# Patient Record
Sex: Male | Born: 1945 | Race: White | Hispanic: No | Marital: Married | Smoking: Never smoker
Health system: Southern US, Community
[De-identification: ages and names within clinical notes are randomized; demographics above are authoritative.]

## PROBLEM LIST (undated history)

## (undated) DIAGNOSIS — R42 Dizziness and giddiness: Secondary | ICD-10-CM

## (undated) DIAGNOSIS — E781 Pure hyperglyceridemia: Secondary | ICD-10-CM

## (undated) DIAGNOSIS — Z951 Presence of aortocoronary bypass graft: Secondary | ICD-10-CM

## (undated) DIAGNOSIS — G20 Parkinson's disease: Secondary | ICD-10-CM

## (undated) DIAGNOSIS — Z9889 Other specified postprocedural states: Secondary | ICD-10-CM

## (undated) DIAGNOSIS — I2510 Atherosclerotic heart disease of native coronary artery without angina pectoris: Secondary | ICD-10-CM

## (undated) DIAGNOSIS — G709 Myoneural disorder, unspecified: Secondary | ICD-10-CM

## (undated) DIAGNOSIS — G20A1 Parkinson's disease without dyskinesia, without mention of fluctuations: Secondary | ICD-10-CM

## (undated) DIAGNOSIS — I10 Essential (primary) hypertension: Secondary | ICD-10-CM

## (undated) DIAGNOSIS — E785 Hyperlipidemia, unspecified: Secondary | ICD-10-CM

## (undated) DIAGNOSIS — E119 Type 2 diabetes mellitus without complications: Secondary | ICD-10-CM

## (undated) DIAGNOSIS — T7840XA Allergy, unspecified, initial encounter: Secondary | ICD-10-CM

## (undated) HISTORY — DX: Other specified postprocedural states: Z98.890

## (undated) HISTORY — DX: Type 2 diabetes mellitus without complications: E11.9

## (undated) HISTORY — DX: Dizziness and giddiness: R42

## (undated) HISTORY — DX: Parkinson's disease without dyskinesia, without mention of fluctuations: G20.A1

## (undated) HISTORY — DX: Myoneural disorder, unspecified: G70.9

## (undated) HISTORY — DX: Pure hyperglyceridemia: E78.1

## (undated) HISTORY — DX: Essential (primary) hypertension: I10

## (undated) HISTORY — DX: Hyperlipidemia, unspecified: E78.5

## (undated) HISTORY — DX: Presence of aortocoronary bypass graft: Z95.1

## (undated) HISTORY — DX: Parkinson's disease: G20

## (undated) HISTORY — DX: Atherosclerotic heart disease of native coronary artery without angina pectoris: I25.10

## (undated) HISTORY — DX: Allergy, unspecified, initial encounter: T78.40XA

---

## 1998-03-09 HISTORY — PX: CORONARY ARTERY BYPASS GRAFT: SHX141

## 1998-04-09 DIAGNOSIS — Z951 Presence of aortocoronary bypass graft: Secondary | ICD-10-CM

## 1998-04-09 HISTORY — DX: Presence of aortocoronary bypass graft: Z95.1

## 1998-04-10 ENCOUNTER — Encounter: Payer: Self-pay | Admitting: Thoracic Surgery (Cardiothoracic Vascular Surgery)

## 1998-04-11 ENCOUNTER — Inpatient Hospital Stay (HOSPITAL_COMMUNITY)
Admission: RE | Admit: 1998-04-11 | Discharge: 1998-04-15 | Payer: Self-pay | Admitting: Thoracic Surgery (Cardiothoracic Vascular Surgery)

## 1998-04-11 ENCOUNTER — Encounter: Payer: Self-pay | Admitting: Thoracic Surgery (Cardiothoracic Vascular Surgery)

## 1998-04-12 ENCOUNTER — Encounter: Payer: Self-pay | Admitting: Thoracic Surgery (Cardiothoracic Vascular Surgery)

## 1998-04-13 ENCOUNTER — Encounter: Payer: Self-pay | Admitting: Thoracic Surgery (Cardiothoracic Vascular Surgery)

## 2004-03-09 ENCOUNTER — Encounter: Payer: Self-pay | Admitting: Cardiology

## 2004-04-09 ENCOUNTER — Encounter: Payer: Self-pay | Admitting: Cardiology

## 2004-05-09 ENCOUNTER — Encounter: Payer: Self-pay | Admitting: Cardiology

## 2004-06-09 ENCOUNTER — Encounter: Payer: Self-pay | Admitting: Cardiology

## 2004-07-10 ENCOUNTER — Encounter: Payer: Self-pay | Admitting: Cardiology

## 2004-08-07 ENCOUNTER — Encounter: Payer: Self-pay | Admitting: Cardiology

## 2004-09-07 ENCOUNTER — Encounter: Payer: Self-pay | Admitting: Cardiology

## 2004-10-07 ENCOUNTER — Encounter: Payer: Self-pay | Admitting: Cardiology

## 2004-11-07 ENCOUNTER — Encounter: Payer: Self-pay | Admitting: Cardiology

## 2004-12-07 ENCOUNTER — Encounter: Payer: Self-pay | Admitting: Cardiology

## 2005-01-07 ENCOUNTER — Encounter: Payer: Self-pay | Admitting: Cardiology

## 2005-02-07 ENCOUNTER — Encounter: Payer: Self-pay | Admitting: Cardiology

## 2005-03-09 ENCOUNTER — Encounter: Payer: Self-pay | Admitting: Cardiology

## 2005-04-09 ENCOUNTER — Encounter: Payer: Self-pay | Admitting: Cardiology

## 2005-05-09 ENCOUNTER — Encounter: Payer: Self-pay | Admitting: Cardiology

## 2005-06-09 ENCOUNTER — Encounter: Payer: Self-pay | Admitting: Cardiology

## 2005-07-10 ENCOUNTER — Encounter: Payer: Self-pay | Admitting: Cardiology

## 2005-08-07 ENCOUNTER — Encounter: Payer: Self-pay | Admitting: Cardiology

## 2005-09-07 ENCOUNTER — Encounter: Payer: Self-pay | Admitting: Cardiology

## 2005-10-07 ENCOUNTER — Encounter: Payer: Self-pay | Admitting: Cardiology

## 2005-11-07 ENCOUNTER — Encounter: Payer: Self-pay | Admitting: Cardiology

## 2005-12-07 ENCOUNTER — Encounter: Payer: Self-pay | Admitting: Cardiology

## 2006-01-07 ENCOUNTER — Encounter: Payer: Self-pay | Admitting: Cardiology

## 2006-02-07 ENCOUNTER — Encounter: Payer: Self-pay | Admitting: Cardiology

## 2006-03-09 ENCOUNTER — Encounter: Payer: Self-pay | Admitting: Cardiology

## 2006-04-09 ENCOUNTER — Encounter: Payer: Self-pay | Admitting: Cardiology

## 2006-05-09 ENCOUNTER — Encounter: Payer: Self-pay | Admitting: Cardiology

## 2006-06-09 ENCOUNTER — Encounter: Payer: Self-pay | Admitting: Cardiology

## 2006-07-10 ENCOUNTER — Encounter: Payer: Self-pay | Admitting: Cardiology

## 2006-08-08 ENCOUNTER — Encounter: Payer: Self-pay | Admitting: Cardiology

## 2006-09-08 ENCOUNTER — Encounter: Payer: Self-pay | Admitting: Cardiology

## 2006-10-08 ENCOUNTER — Encounter: Payer: Self-pay | Admitting: Cardiology

## 2006-11-08 ENCOUNTER — Encounter: Payer: Self-pay | Admitting: Cardiology

## 2006-12-08 ENCOUNTER — Encounter: Payer: Self-pay | Admitting: Cardiology

## 2009-03-21 ENCOUNTER — Ambulatory Visit: Payer: Self-pay | Admitting: Gastroenterology

## 2009-06-09 DIAGNOSIS — Z9889 Other specified postprocedural states: Secondary | ICD-10-CM

## 2009-06-09 HISTORY — DX: Other specified postprocedural states: Z98.890

## 2009-06-23 LAB — HM COLONOSCOPY: HM Colonoscopy: NORMAL

## 2010-05-09 ENCOUNTER — Ambulatory Visit: Payer: Self-pay | Admitting: Cardiology

## 2010-05-19 HISTORY — PX: CARDIAC CATHETERIZATION: SHX172

## 2010-11-19 ENCOUNTER — Ambulatory Visit: Payer: BC Managed Care – PPO | Admitting: Internal Medicine

## 2011-04-29 ENCOUNTER — Other Ambulatory Visit: Payer: Self-pay | Admitting: Internal Medicine

## 2011-04-29 MED ORDER — METFORMIN HCL 500 MG PO TABS: 500.0000 mg | ORAL_TABLET | Freq: Two times a day (BID) | ORAL | Status: DC

## 2011-05-21 ENCOUNTER — Ambulatory Visit (INDEPENDENT_AMBULATORY_CARE_PROVIDER_SITE_OTHER): Payer: Medicare Other | Admitting: Internal Medicine

## 2011-05-21 ENCOUNTER — Encounter: Payer: Self-pay | Admitting: Internal Medicine

## 2011-05-21 DIAGNOSIS — Z23 Encounter for immunization: Secondary | ICD-10-CM

## 2011-05-21 DIAGNOSIS — E119 Type 2 diabetes mellitus without complications: Secondary | ICD-10-CM

## 2011-05-21 DIAGNOSIS — E785 Hyperlipidemia, unspecified: Secondary | ICD-10-CM

## 2011-05-21 DIAGNOSIS — K2970 Gastritis, unspecified, without bleeding: Secondary | ICD-10-CM

## 2011-05-21 DIAGNOSIS — I209 Angina pectoris, unspecified: Secondary | ICD-10-CM

## 2011-05-21 DIAGNOSIS — Z0000 Encounter for general adult medical examination without abnormal findings: Secondary | ICD-10-CM

## 2011-05-21 DIAGNOSIS — E1159 Type 2 diabetes mellitus with other circulatory complications: Secondary | ICD-10-CM

## 2011-05-21 DIAGNOSIS — E1121 Type 2 diabetes mellitus with diabetic nephropathy: Secondary | ICD-10-CM | POA: Insufficient documentation

## 2011-05-21 DIAGNOSIS — R7989 Other specified abnormal findings of blood chemistry: Secondary | ICD-10-CM

## 2011-05-21 DIAGNOSIS — Z125 Encounter for screening for malignant neoplasm of prostate: Secondary | ICD-10-CM

## 2011-05-21 DIAGNOSIS — Z79899 Other long term (current) drug therapy: Secondary | ICD-10-CM

## 2011-05-21 DIAGNOSIS — K2990 Gastroduodenitis, unspecified, without bleeding: Secondary | ICD-10-CM

## 2011-05-21 DIAGNOSIS — E790 Hyperuricemia without signs of inflammatory arthritis and tophaceous disease: Secondary | ICD-10-CM

## 2011-05-21 LAB — LIPID PANEL
Cholesterol: 130 mg/dL (ref 0–200)
HDL: 61.6 mg/dL (ref >39.00)
Total CHOL/HDL Ratio: 2
Triglycerides: 155 mg/dL — ABNORMAL HIGH (ref 0.0–149.0)

## 2011-05-21 LAB — COMPREHENSIVE METABOLIC PANEL
AST: 19 U/L (ref 0–37)
Albumin: 4.7 g/dL (ref 3.5–5.2)
BUN: 17 mg/dL (ref 6–23)
Calcium: 9.3 mg/dL (ref 8.4–10.5)
Chloride: 104 mEq/L (ref 96–112)
Creatinine, Ser: 1.4 mg/dL (ref 0.4–1.5)
GFR: 53.83 mL/min — ABNORMAL LOW (ref >60.00)
Glucose, Bld: 92 mg/dL (ref 70–99)

## 2011-05-21 LAB — HEMOGLOBIN A1C: Hgb A1c MFr Bld: 6.5 % (ref 4.6–6.5)

## 2011-05-21 MED ORDER — SIMVASTATIN 20 MG PO TABS: 20.0000 mg | ORAL_TABLET | Freq: Every day | ORAL | Status: DC

## 2011-05-21 MED ORDER — ISOSORBIDE MONONITRATE ER 30 MG PO TB24: 30.0000 mg | ORAL_TABLET | Freq: Every day | ORAL | Status: DC

## 2011-05-21 MED ORDER — ZOSTER VACCINE LIVE 19400 UNT/0.65ML ~~LOC~~ SOLR: 0.6500 mL | Freq: Once | SUBCUTANEOUS | Status: AC

## 2011-05-21 NOTE — Progress Notes (Signed)
Subjective:    Patient ID: Johnathan Arnold, male    DOB: Sep 01, 1945, 65 y.o.   MRN: [id number]  HPI  Cc indigestion 2-3 hrs post prandial,  burnign in stomach. No esophogitis sympotms.  Takes tums for relief . Has anginal pain daily with exertion, but tapers off with continued use.  Had a clean cardiac cath one year ago and wa s put on Imdur 30 mg daily .   Review of Systems     Objective:   Physical Exam        Assessment & Plan:   Subjective:    Johnathan Arnold is a 65 y.o. male who presents for Medicare Annual/Subsequent preventive examination.   Preventive Screening-Counseling & Management  Tobacco History  Smoking status  . Never Smoker   Smokeless tobacco  . Never Used    Problems Prior to Visit 1.   Current Problems (verified) There is no problem list on file for this patient.   Medications Prior to Visit Current Outpatient Prescriptions on File Prior to Visit  Medication Sig Dispense Refill  . metFORMIN (GLUCOPHAGE) 500 MG tablet Take 1 tablet (500 mg total) by mouth 2 (two) times daily with a meal.  180 tablet  3    Current Medications (verified) Current Outpatient Prescriptions  Medication Sig Dispense Refill  . aspirin 81 MG tablet Take 81 mg by mouth daily.        Marland Kitchen atenolol (TENORMIN) 50 MG tablet Take 50 mg by mouth daily.        . fish oil-omega-3 fatty acids 1000 MG capsule Take 2 g by mouth daily.        . isosorbide mononitrate (IMDUR) 30 MG 24 hr tablet Take 1 tablet (30 mg total) by mouth daily.  90 tablet  3  . lisinopril (PRINIVIL,ZESTRIL) 20 MG tablet Take 20 mg by mouth daily.        . metFORMIN (GLUCOPHAGE) 500 MG tablet Take 1 tablet (500 mg total) by mouth 2 (two) times daily with a meal.  180 tablet  3  . Multiple Vitamin (MULTIVITAMIN) tablet Take 1 tablet by mouth daily.        . niacin (NIASPAN) 1000 MG CR tablet Take 1,000 mg by mouth at bedtime.        . simvastatin (ZOCOR) 20 MG tablet Take 1 tablet (20 mg total) by mouth at  bedtime.  90 tablet  3  . zoster vaccine live, PF, (ZOSTAVAX) 40981 UNT/0.65ML injection Inject 19,400 Units into the skin once.  1 vial  0     Allergies (verified) Review of patient's allergies indicates no known allergies.   PAST HISTORY  Family History No family history on file.  Social History History  Substance Use Topics  . Smoking status: Never Smoker   . Smokeless tobacco: Never Used  . Alcohol Use: No    Are there smokers in your home (other than you)?  No  Risk Factors Current exercise habits: Home exercise routine includes walking 1 hrs per day.  Dietary issues discussed: none   Cardiac risk factors: advanced age (older than 21 for men, 55 for women), diabetes mellitus, dyslipidemia and known CAD.  Depression Screen (Note: if answer to either of the following is "Yes", a more complete depression screening is indicated)   Q1: Over the past two weeks, have you felt down, depressed or hopeless? No  Q2: Over the past two weeks, have you felt little interest or pleasure in doing things? No  Have you lost interest or pleasure in daily life? No  Do you often feel hopeless? No  Do you cry easily over simple problems? No  Activities of Daily Living In your present state of health, do you have any difficulty performing the following activities?:  Driving? no Managing money?  No Feeding yourself? No Getting from bed to chair? No Climbing a flight of stairs? No Preparing food and eating?: No Bathing or showering? No Getting dressed: No Getting to the toilet? No Using the toilet:No Moving around from place to place: No In the past year have you fallen or had a near fall?:No   Are you sexually active?  Yes  Do you have more than one partner?  No  Hearing Difficulties: No Do you often ask people to speak up or repeat themselves? No Do you experience ringing or noises in your ears? No Do you have difficulty understanding soft or whispered voices? No   Do you feel  that you have a problem with memory? No  Do you often misplace items? No  Do you feel safe at home?  Yes  Cognitive Testing  Alert? yes  Normal Appearance?Yes  Oriented to person? Yes  Place? Yes   Time? Yes  Recall of three objects?  Yes  Can perform simple calculations? Yes  Displays appropriate judgment?Yes  Can read the correct time from a watch face?Yes   Advanced Directives have been discussed with the patient? No   List the Names of Other Physician/Practitioners you currently use: 1.    Indicate any recent Medical Services you may have received from other than [facility] providers in the past year (date may be approximate).  Immunization History  Administered Date(s) Administered  . Influenza Split 02/19/2011  . Pneumococcal Polysaccharide 05/21/2011  . Tdap 12/19/2010    Screening Tests Health Maintenance  Topic Date Due  . Tetanus/tdap  01/12/1964  . Colonoscopy  01/12/1995  . Zostavax  01/11/2005  . Pneumococcal Polysaccharide Vaccine Age 76 And Over  01/11/2010  . Influenza Vaccine  03/10/2011    All answers were reviewed with the patient and necessary referrals were made:  Duncan Dull, MD   05/21/2011   History reviewed: allergies, current medications, past family history, past medical history and past surgical history  Review of Systems A comprehensive review of systems was negative.    Objective:     Vision by Snellen chart: right eye:20/20, left eye:20/20 Blood pressure 132/60, pulse 54, temperature 98.2 F (36.8 C), temperature source Oral, resp. rate 16, height 5\' 7"  (1.702 m), weight 195 lb 4 oz (88.565 kg), SpO2 98.00%. Body mass index is 30.58 kg/(m^2).  BP 132/60  Pulse 54  Temp(Src) 98.2 F (36.8 C) (Oral)  Resp 16  Ht 5\' 7"  (1.702 m)  Wt 195 lb 4 oz (88.565 kg)  BMI 30.58 kg/m2  SpO2 98%  General Appearance:    Alert, cooperative, no distress, appears stated age  Head:    Normocephalic, without obvious abnormality, atraumatic  Eyes:     PERRL, conjunctiva/corneas clear, EOM's intact, fundi    benign, both eyes       Ears:    Normal TM's and external ear canals, both ears  Nose:   Nares normal, septum midline, mucosa normal, no drainage    or sinus tenderness  Throat:   Lips, mucosa, and tongue normal; teeth and gums normal  Neck:   Supple, symmetrical, trachea midline, no adenopathy;       thyroid:  No enlargement/tenderness/nodules; no  carotid   bruit or JVD  Back:     Symmetric, no curvature, ROM normal, no CVA tenderness  Lungs:     Clear to auscultation bilaterally, respirations unlabored  Chest wall:    No tenderness or deformity  Heart:    Regular rate and rhythm, S1 and S2 normal, no murmur, rub   or gallop  Abdomen:     Soft, non-tender, bowel sounds active all four quadrants,    no masses, no organomegaly  Genitalia:    Normal male without lesion, discharge or tenderness  Rectal:    Normal tone, normal prostate, no masses or tenderness;   guaiac negative stool  Extremities:   Extremities normal, atraumatic, no cyanosis or edema  Pulses:   2+ and symmetric all extremities  Skin:   Skin color, texture, turgor normal, no rashes or lesions  Lymph nodes:   Cervical, supraclavicular, and axillary nodes normal  Neurologic:   CNII-XII intact. Normal strength, sensation and reflexes      throughout       Assessment:  Healthy male annual exam Diabetes Mellitus,  Coronary Artery Disease     Plan:     During the course of the visit the patient was educated and counseled about appropriate screening and preventive services including:    Pneumococcal vaccine   Influenza vaccine  Td vaccine  Diet review for nutrition referral? Yes ____  Not Indicated __x__   Patient Instructions (the written plan) was given to the patient.  Medicare Attestation I have personally reviewed: The patient's medical and social history Their use of alcohol, tobacco or illicit drugs Their current medications and  supplements The patient's functional ability including ADLs,fall risks, home safety risks, cognitive, and hearing and visual impairment Diet and physical activities Evidence for depression or mood disorders  The patient's weight, height, BMI, and visual acuity have been recorded in the chart.  I have made referrals, counseling, and provided education to the patient based on review of the above and I have provided the patient with a written personalized care plan for preventive services.     Duncan Dull, MD   05/21/2011

## 2011-05-21 NOTE — Patient Instructions (Addendum)
There are 3 PPIs OTC:  Prilosec, Prevacid,  And Zegerid. Try one of those daily for 2 weeks,  If no improvement, we will set up GI referrral to Dr. Alexander Bergeron.   I need your PSA from last year and Dr. Jasmine December records.    I will do your prostate exam at your next 3 month heckup

## 2011-05-21 NOTE — Assessment & Plan Note (Signed)
Trial of daily PPI recommended .  If no improvement  In 2 weeks,  Will refer for EGD

## 2011-05-21 NOTE — Assessment & Plan Note (Signed)
recurrent despite normal cardiac catherization one year ago and use of Imdur.  Suggested increasing dose to 60 mg but he refers to let Dr. Darrold Junker managed

## 2011-05-21 NOTE — Assessment & Plan Note (Signed)
hgba1c has been historically well controlled

## 2011-05-22 LAB — MICROALBUMIN / CREATININE URINE RATIO
Creatinine,U: 49.6 mg/dL
Microalb, Ur: 0.8 mg/dL (ref 0.0–1.9)

## 2011-06-13 DIAGNOSIS — M999 Biomechanical lesion, unspecified: Secondary | ICD-10-CM | POA: Diagnosis not present

## 2011-06-13 DIAGNOSIS — M5380 Other specified dorsopathies, site unspecified: Secondary | ICD-10-CM | POA: Diagnosis not present

## 2011-06-13 DIAGNOSIS — IMO0002 Reserved for concepts with insufficient information to code with codable children: Secondary | ICD-10-CM | POA: Diagnosis not present

## 2011-06-27 DIAGNOSIS — M999 Biomechanical lesion, unspecified: Secondary | ICD-10-CM | POA: Diagnosis not present

## 2011-06-27 DIAGNOSIS — IMO0002 Reserved for concepts with insufficient information to code with codable children: Secondary | ICD-10-CM | POA: Diagnosis not present

## 2011-06-27 DIAGNOSIS — M5380 Other specified dorsopathies, site unspecified: Secondary | ICD-10-CM | POA: Diagnosis not present

## 2011-07-17 DIAGNOSIS — IMO0002 Reserved for concepts with insufficient information to code with codable children: Secondary | ICD-10-CM | POA: Diagnosis not present

## 2011-07-17 DIAGNOSIS — M5380 Other specified dorsopathies, site unspecified: Secondary | ICD-10-CM | POA: Diagnosis not present

## 2011-07-17 DIAGNOSIS — M999 Biomechanical lesion, unspecified: Secondary | ICD-10-CM | POA: Diagnosis not present

## 2011-07-31 DIAGNOSIS — E782 Mixed hyperlipidemia: Secondary | ICD-10-CM | POA: Diagnosis not present

## 2011-07-31 DIAGNOSIS — R0789 Other chest pain: Secondary | ICD-10-CM | POA: Diagnosis not present

## 2011-07-31 DIAGNOSIS — I2510 Atherosclerotic heart disease of native coronary artery without angina pectoris: Secondary | ICD-10-CM | POA: Diagnosis not present

## 2011-08-13 ENCOUNTER — Encounter: Payer: Self-pay | Admitting: Internal Medicine

## 2011-08-13 DIAGNOSIS — M999 Biomechanical lesion, unspecified: Secondary | ICD-10-CM | POA: Diagnosis not present

## 2011-08-13 DIAGNOSIS — M5380 Other specified dorsopathies, site unspecified: Secondary | ICD-10-CM | POA: Diagnosis not present

## 2011-08-13 DIAGNOSIS — IMO0002 Reserved for concepts with insufficient information to code with codable children: Secondary | ICD-10-CM | POA: Diagnosis not present

## 2011-08-21 ENCOUNTER — Encounter: Payer: Self-pay | Admitting: Internal Medicine

## 2011-08-21 ENCOUNTER — Ambulatory Visit (INDEPENDENT_AMBULATORY_CARE_PROVIDER_SITE_OTHER): Payer: Medicare Other | Admitting: Internal Medicine

## 2011-08-21 VITALS — BP 136/60 | HR 71 | Temp 97.8°F | Resp 14 | Ht 67.0 in | Wt 194.5 lb

## 2011-08-21 DIAGNOSIS — E781 Pure hyperglyceridemia: Secondary | ICD-10-CM

## 2011-08-21 DIAGNOSIS — I209 Angina pectoris, unspecified: Secondary | ICD-10-CM

## 2011-08-21 DIAGNOSIS — E1169 Type 2 diabetes mellitus with other specified complication: Secondary | ICD-10-CM

## 2011-08-21 DIAGNOSIS — E119 Type 2 diabetes mellitus without complications: Secondary | ICD-10-CM | POA: Diagnosis not present

## 2011-08-21 DIAGNOSIS — Z951 Presence of aortocoronary bypass graft: Secondary | ICD-10-CM | POA: Insufficient documentation

## 2011-08-21 DIAGNOSIS — E1159 Type 2 diabetes mellitus with other circulatory complications: Secondary | ICD-10-CM

## 2011-08-21 MED ORDER — HYDROCORTISONE ACETATE 25 MG RE SUPP: 25.0000 mg | Freq: Two times a day (BID) | RECTAL | Status: AC

## 2011-08-21 NOTE — Patient Instructions (Signed)
I am sending you to [facility] for ankle brachial indices to evaluate the circulation in your feet

## 2011-08-21 NOTE — Assessment & Plan Note (Addendum)
With recent evaluation by Dr. Laurena Bering who has increased imdur to 60 mg daily  and added prilosec for presumed reflux. These changes have not altered his pain.  His last cardiac cath was clear in dec 2011, which was his first repeat Since his bypass in 2000.  we discussed the diagnosis of Prinzmetal's angina or vasospasm as a possible source for his pain he is following up with Dr. Gwen Pounds this month.

## 2011-08-22 LAB — COMPLETE METABOLIC PANEL WITH GFR
ALT: 20 U/L (ref 0–53)
Albumin: 4.7 g/dL (ref 3.5–5.2)
CO2: 24 mEq/L (ref 19–32)
Calcium: 9.6 mg/dL (ref 8.4–10.5)
Chloride: 104 mEq/L (ref 96–112)
GFR, Est African American: 66 mL/min
GFR, Est Non African American: 57 mL/min — ABNORMAL LOW
Glucose, Bld: 185 mg/dL — ABNORMAL HIGH (ref 70–99)
Potassium: 4.6 mEq/L (ref 3.5–5.3)
Sodium: 140 mEq/L (ref 135–145)
Total Bilirubin: 0.4 mg/dL (ref 0.3–1.2)
Total Protein: 6.2 g/dL (ref 6.0–8.3)

## 2011-08-23 ENCOUNTER — Encounter: Payer: Self-pay | Admitting: Internal Medicine

## 2011-08-23 DIAGNOSIS — I2510 Atherosclerotic heart disease of native coronary artery without angina pectoris: Secondary | ICD-10-CM | POA: Insufficient documentation

## 2011-08-23 NOTE — Progress Notes (Signed)
Subjective:    Patient ID: Johnathan Arnold, male    DOB: 02-12-1946, 66 y.o.   MRN: [id number]  HPI Mr. Mcconaha is a 66 year old white male with a history of diabetes mellitus type 2 well controlled hyperlipidemia well controlled and coronary artery disease status post non-5 vessel CABG several years ago who presents for three-month followup on diabetes. During the interim he has been seen by his cardiologist Dr. Gwen Pounds for recurrent angina which occurs with exercise. He has been placed on Imdur with no significant change. Apparently he has had a noninvasive stress test which was nonrevealing. He is following up with Dr. Gwen Pounds within the next 30 days. Regarding his diabetes and hyperlipidemia he has been following a low carbohydrate diet taking his medications correctly. And has no side effects from his medications. He is due for his repeat hemoglobin A1c today. He denies any numbness or tingling in his feet and has had no hypoglycemic events. He is up-to-date on his annual diabetic eye exams  Past Medical History  Diagnosis Date  . 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 Hypertriglyceridemia   . 3-vessel coronary artery disease     s/p  5 vessel CABG   Past Surgical History  Procedure Date  . Coronary artery bypass graft     5 vessel, [facility]   Review of Systems  Constitutional: Negative for fever, chills, diaphoresis, activity change, appetite change, fatigue and unexpected weight change.  HENT: Negative for hearing loss, ear pain, nosebleeds, congestion, sore throat, facial swelling, rhinorrhea, sneezing, drooling, mouth sores, trouble swallowing, neck pain, neck stiffness, dental problem, voice change, postnasal drip, sinus pressure, tinnitus and ear discharge.   Eyes: Negative for photophobia, pain, discharge, redness, itching and visual disturbance.  Respiratory: Negative for apnea, cough, choking, chest tightness, shortness of breath, wheezing and stridor.   Cardiovascular: Positive for chest pain.  Negative for palpitations and leg swelling.  Gastrointestinal: Negative for nausea, vomiting, abdominal pain, diarrhea, constipation, blood in stool, abdominal distention, anal bleeding and rectal pain.  Genitourinary: Negative for dysuria, urgency, frequency, hematuria, flank pain, decreased urine volume, scrotal swelling, difficulty urinating and testicular pain.  Musculoskeletal: Negative for myalgias, back pain, joint swelling, arthralgias and gait problem.  Skin: Negative for color change, rash and wound.  Neurological: Negative for dizziness, tremors, seizures, syncope, speech difficulty, weakness, light-headedness, numbness and headaches.  Psychiatric/Behavioral: Negative for suicidal ideas, hallucinations, behavioral problems, confusion, sleep disturbance, dysphoric mood, decreased concentration and agitation. The patient is not nervous/anxious.        Objective:   Physical Exam  Constitutional: He is oriented to person, place, and time.  HENT:  Head: Normocephalic and atraumatic.  Mouth/Throat: Oropharynx is clear and moist.  Eyes: Conjunctivae and EOM are normal.  Neck: Normal range of motion. Neck supple. No JVD present. No thyromegaly present.  Cardiovascular: Normal rate, regular rhythm and normal heart sounds.   Pulmonary/Chest: Effort normal and breath sounds normal. He has no wheezes. He has no rales.  Abdominal: Soft. Bowel sounds are normal. He exhibits no mass. There is no tenderness. There is no rebound.  Musculoskeletal: Normal range of motion. He exhibits no edema.  Neurological: He is alert and oriented to person, place, and time.  Skin: Skin is warm and dry.  Psychiatric: He has a normal mood and affect. Thought content normal.  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 Laurena Bering who has increased imdur to 60  mg daily  and added prilosec for presumed reflux. These changes have not altered his pain.  His last cardiac cath was clear in dec 2011, which was his first repeat Since his bypass in 2000.  we discussed the diagnosis of Prinzmetal's angina or vasospasm as a possible source for his pain he is following up with Dr. Gwen Pounds this month.   Diabetes mellitus type 2, controlled Well-controlled on current medications hemoglobin A1c has been consistently less than 7.0 . He is up-to-date on eye exams and his foot exam is normal we'll repeat his urine microalbumin to creatinine ratio at next visit. He is on the appropriate medications.  Hypertriglyceridemia LDL and triglycerides are at goal on current medications. He has no side effects and liver enzymes are normal. No changes today    Updated Medication List Outpatient Encounter Prescriptions as of 08/21/2011  Medication Sig Dispense Refill  . aspirin 81 MG tablet Take 81 mg by mouth daily.        Marland Kitchen atenolol (TENORMIN) 50 MG tablet Take 50 mg by mouth daily.        . fish oil-omega-3 fatty acids 1000 MG capsule Take 2 g by mouth daily.        . isosorbide mononitrate (IMDUR) 30 MG 24 hr tablet Take 60 mg by mouth daily.      Marland Kitchen lisinopril (PRINIVIL,ZESTRIL) 20 MG tablet Take 20 mg by mouth daily.        . metFORMIN (GLUCOPHAGE) 500 MG tablet Take 1 tablet (500 mg total) by mouth 2 (two) times daily with a meal.  180 tablet  3  . Multiple Vitamin (MULTIVITAMIN) tablet Take 1 tablet by mouth daily.        . niacin (NIASPAN) 1000 MG CR tablet Take 1,000 mg by mouth at bedtime.        Marland Kitchen omeprazole (PRILOSEC) 20 MG capsule Take 20 mg by mouth daily.      . simvastatin (ZOCOR) 20 MG tablet Take 1 tablet (20 mg total) by mouth at bedtime.  90 tablet  3  . zolpidem (AMBIEN) 10 MG tablet Take 10 mg by mouth at bedtime as needed.      Marland Kitchen DISCONTD: isosorbide mononitrate (IMDUR) 30 MG 24 hr tablet Take 1 tablet (30 mg total) by mouth daily.  90 tablet  3  .  hydrocortisone (ANUSOL-HC) 25 MG suppository Place 1 suppository (25 mg total) rectally 2 (two) times daily.  12 suppository  2

## 2011-08-23 NOTE — Assessment & Plan Note (Signed)
Well-controlled on current medications.  hemoglobin A1c has been consistently less than 7.0 . He is up-to-date on eye exams and his foot exam is norma. l we'll repeat his urine microalbumin to creatinine ratio at next visit. He is on the appropriate medications. 

## 2011-08-23 NOTE — Assessment & Plan Note (Signed)
LDL and triglycerides are at goal on current medications. He has no side effects and liver enzymes are normal. No changes today  

## 2011-08-28 DIAGNOSIS — I2510 Atherosclerotic heart disease of native coronary artery without angina pectoris: Secondary | ICD-10-CM | POA: Diagnosis not present

## 2011-08-28 DIAGNOSIS — I739 Peripheral vascular disease, unspecified: Secondary | ICD-10-CM | POA: Diagnosis not present

## 2011-08-28 DIAGNOSIS — E669 Obesity, unspecified: Secondary | ICD-10-CM | POA: Diagnosis not present

## 2011-08-28 DIAGNOSIS — I10 Essential (primary) hypertension: Secondary | ICD-10-CM | POA: Diagnosis not present

## 2011-09-08 ENCOUNTER — Telehealth: Payer: Self-pay | Admitting: Internal Medicine

## 2011-09-08 NOTE — Telephone Encounter (Signed)
[facility] Triage Call Report Triage Record Num: [id number] Operator: Caswell Corwin Patient Name: Johnathan Arnold Call Date & Time: 09/08/2011 9:15:45AM Patient Phone: [phone] PCP: Patient Gender: Male PCP Fax : Patient DOB: 24-Dec-1945 Practice Name: [facility] Reason for Call: [name] calling that he started with body aches and tamp of 100.9 09/07/11. Today having vomiting x 1 since MN and Diarrhea x 6 since MN. Afebrile now. Tiraged Diarrhea or other Change in Bowel Habits. Has voided this AM. All emergent SX R/O. Home care and call back inst given. Protocol(s) Used: Diarrhea or Other Change in Bowel Habits Recommended Outcome per Protocol: Provide Home/Self Care Reason for Outcome: All other situations Care Advice: ~ Call provider if diarrhea does not improve after 2 days of home care. Diarrheal Care: - Drink 2-3 quarts (2-3 liters) per day of low sugar content fluids, including over the counter oral hydration solution, unless directed otherwise by provider. - If accompanied by vomiting, take the fluids in frequent small sips or suck on ice chips. - Eat easily digested foods (such as bananas, rice, applesauce, toast, cooked cereals, soup, crackers, baked or boiled potato, or baked chicken or [place] without skin). - Do not eat high fiber, high fat, high sugar content foods, or highly seasoned foods. - Do not drink caffeinated or alcoholic beverages. - Avoid milk and milk products while having symptoms. As symptoms improve, gradually add back to diet. - Application of A&D ointment or witch hazel medicated pads may help anal irritation. - Antidiarrheal medications are usually unnecessary. If symptoms are severe, consider nonprescription antidiarrheal and anti-motility drugs as directed by label or a provider. Do not take if have high fever or bloody diarrhea. If pregnant, do not take any medications not approved by your provider. - Consult your provider for  advice regarding continuing prescription medication. ~ 09/08/2011 9:28:28AM Page 1 of 1 [id number]

## 2011-09-08 NOTE — Telephone Encounter (Signed)
I called patient I advised he got to the ER since he is having vomiting and diarrhea.  He stated he will just stay at home. I advised a clear liquid diet and go to the ER if the fever persist.

## 2011-09-17 DIAGNOSIS — IMO0002 Reserved for concepts with insufficient information to code with codable children: Secondary | ICD-10-CM | POA: Diagnosis not present

## 2011-09-17 DIAGNOSIS — M5380 Other specified dorsopathies, site unspecified: Secondary | ICD-10-CM | POA: Diagnosis not present

## 2011-09-17 DIAGNOSIS — M999 Biomechanical lesion, unspecified: Secondary | ICD-10-CM | POA: Diagnosis not present

## 2011-09-18 DIAGNOSIS — I739 Peripheral vascular disease, unspecified: Secondary | ICD-10-CM | POA: Diagnosis not present

## 2011-09-18 DIAGNOSIS — M79609 Pain in unspecified limb: Secondary | ICD-10-CM | POA: Diagnosis not present

## 2011-10-15 DIAGNOSIS — M999 Biomechanical lesion, unspecified: Secondary | ICD-10-CM | POA: Diagnosis not present

## 2011-10-15 DIAGNOSIS — IMO0002 Reserved for concepts with insufficient information to code with codable children: Secondary | ICD-10-CM | POA: Diagnosis not present

## 2011-10-15 DIAGNOSIS — M5380 Other specified dorsopathies, site unspecified: Secondary | ICD-10-CM | POA: Diagnosis not present

## 2011-11-05 DIAGNOSIS — E782 Mixed hyperlipidemia: Secondary | ICD-10-CM | POA: Diagnosis not present

## 2011-11-05 DIAGNOSIS — R0789 Other chest pain: Secondary | ICD-10-CM | POA: Diagnosis not present

## 2011-11-05 DIAGNOSIS — I2510 Atherosclerotic heart disease of native coronary artery without angina pectoris: Secondary | ICD-10-CM | POA: Diagnosis not present

## 2011-11-12 DIAGNOSIS — M5380 Other specified dorsopathies, site unspecified: Secondary | ICD-10-CM | POA: Diagnosis not present

## 2011-11-12 DIAGNOSIS — IMO0002 Reserved for concepts with insufficient information to code with codable children: Secondary | ICD-10-CM | POA: Diagnosis not present

## 2011-11-12 DIAGNOSIS — R0789 Other chest pain: Secondary | ICD-10-CM | POA: Diagnosis not present

## 2011-11-12 DIAGNOSIS — M999 Biomechanical lesion, unspecified: Secondary | ICD-10-CM | POA: Diagnosis not present

## 2011-11-19 DIAGNOSIS — E782 Mixed hyperlipidemia: Secondary | ICD-10-CM | POA: Diagnosis not present

## 2011-11-19 DIAGNOSIS — I2510 Atherosclerotic heart disease of native coronary artery without angina pectoris: Secondary | ICD-10-CM | POA: Diagnosis not present

## 2011-11-24 ENCOUNTER — Encounter: Payer: Self-pay | Admitting: Internal Medicine

## 2011-11-24 ENCOUNTER — Ambulatory Visit (INDEPENDENT_AMBULATORY_CARE_PROVIDER_SITE_OTHER): Payer: Medicare Other | Admitting: Internal Medicine

## 2011-11-24 VITALS — BP 120/60 | HR 64 | Temp 98.3°F | Resp 16 | Wt 188.5 lb

## 2011-11-24 DIAGNOSIS — K2970 Gastritis, unspecified, without bleeding: Secondary | ICD-10-CM

## 2011-11-24 DIAGNOSIS — E119 Type 2 diabetes mellitus without complications: Secondary | ICD-10-CM

## 2011-11-24 DIAGNOSIS — K2990 Gastroduodenitis, unspecified, without bleeding: Secondary | ICD-10-CM

## 2011-11-24 DIAGNOSIS — E781 Pure hyperglyceridemia: Secondary | ICD-10-CM | POA: Diagnosis not present

## 2011-11-24 DIAGNOSIS — I209 Angina pectoris, unspecified: Secondary | ICD-10-CM | POA: Diagnosis not present

## 2011-11-24 DIAGNOSIS — R1013 Epigastric pain: Secondary | ICD-10-CM

## 2011-11-24 DIAGNOSIS — E1169 Type 2 diabetes mellitus with other specified complication: Secondary | ICD-10-CM | POA: Diagnosis not present

## 2011-11-24 DIAGNOSIS — E118 Type 2 diabetes mellitus with unspecified complications: Secondary | ICD-10-CM

## 2011-11-24 DIAGNOSIS — K3189 Other diseases of stomach and duodenum: Secondary | ICD-10-CM | POA: Diagnosis not present

## 2011-11-24 DIAGNOSIS — E1159 Type 2 diabetes mellitus with other circulatory complications: Secondary | ICD-10-CM

## 2011-11-24 LAB — HEMOGLOBIN A1C: Hgb A1c MFr Bld: 6.6 % — ABNORMAL HIGH (ref 4.6–6.5)

## 2011-11-24 LAB — COMPLETE METABOLIC PANEL WITH GFR
ALT: 19 U/L (ref 0–53)
AST: 20 U/L (ref 0–37)
Alkaline Phosphatase: 42 U/L (ref 39–117)
BUN: 15 mg/dL (ref 6–23)
Creat: 1.28 mg/dL (ref 0.50–1.35)
Total Bilirubin: 0.6 mg/dL (ref 0.3–1.2)

## 2011-11-24 LAB — H. PYLORI ANTIBODY, IGG: H Pylori IgG: POSITIVE

## 2011-11-24 NOTE — Assessment & Plan Note (Signed)
Well controlled on current regimen,  hgba1c of 6.6 .  No changes today.,  Foot exam was normal

## 2011-11-24 NOTE — Progress Notes (Signed)
Patient ID: Johnathan Arnold, male   DOB: 1945-06-13, 66 y.o.   MRN: [id number] Patient Active Problem List  Diagnosis  . Gastritis  . Diabetes mellitus type 2, controlled  . 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 S/P CABG x 5  . Hypertriglyceridemia  . 3-vessel coronary artery disease    Subjective:  CC:   Chief Complaint  Patient presents with  . Follow-up    3 month    HPI:   Johnathan Baetz Hudsonis a 66 y.o. male who presents for 3 month follow up on diabetes , hypertension and CAD.  He continues to have angina with exertion but was recently evaluated by [facility] with a negative stress test . His Imdur was increased to 90 mg from 30 but his is taking 2.5 tablets (75 mg)of imdur  Daily.  His last cath was 2.5 yrs ago and all 5 bypasses were open. He is wondering if he should get a second opinion with a second cardiologist . His diabetes remains well controlled .  He has no neuropathy, nocturia.   Past Medical History  Diagnosis Date  . 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 Hypertriglyceridemia   . 3-vessel coronary artery disease     s/p  5 vessel CABG    Past Surgical History  Procedure Date  . Coronary artery bypass graft     5 vessel, [facility]         The following portions of the patient's history were reviewed and updated as appropriate: Allergies, current medications, and problem list.    Review of Systems:   12 Pt  review of systems was negative except those addressed in the HPI,     History   Social History  . Marital Status: Married    Spouse Name: N/A    Number of Children: N/A  . Years of Education: N/A   Occupational History  . Not on file.   Social History Main Topics  . Smoking status: Never Smoker   . Smokeless tobacco: Never Used  . Alcohol Use: No  . Drug Use: No  . Sexually Active: Not on file   Other Topics Concern  . Not on file   Social History Narrative  . No narrative on file    Objective:  BP 120/60  Pulse 64   Temp 98.3 F (36.8 C) (Oral)  Resp 16  Wt 188 lb 8 oz (85.503 kg)  SpO2 97%  General appearance: alert, cooperative and appears stated age Ears: normal TM's and external ear canals both ears Throat: lips, mucosa, and tongue normal; teeth and gums normal Neck: no adenopathy, no carotid bruit, supple, symmetrical, trachea midline and thyroid not enlarged, symmetric, no tenderness/mass/nodules Back: symmetric, no curvature. ROM normal. No CVA tenderness. Lungs: clear to auscultation bilaterally Heart: regular rate and rhythm, S1, S2 normal, no murmur, click, rub or gallop Abdomen: soft, non-tender; bowel sounds normal; no masses,  no organomegaly Pulses: 2+ and symmetric Skin: Skin color, texture, turgor normal. No rashes or lesions Lymph nodes: Cervical, supraclavicular, and axillary nodes normal. 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  Suggested trial of nexium since he has concurrent GERD. If there is no imporvement will recommend a second opinion  from Dr. Kirke Corin.   Diabetes mellitus type 2, controlled Well controlled on current regimen,  hgba1c of 6.6 .  No changes today.,  Foot exam was normal  Hypertriglyceridemia Well controlled on current medications.  No changes today.   Updated Medication List Outpatient Encounter Prescriptions as of 11/24/2011  Medication Sig Dispense Refill  . aspirin 81 MG tablet Take 81 mg by mouth daily.        Marland Kitchen atenolol (TENORMIN) 50 MG tablet Take 50 mg by mouth daily.        . fish oil-omega-3 fatty acids 1000 MG capsule Take 2 g by mouth daily.        . isosorbide mononitrate (IMDUR) 30 MG 24 hr tablet Take 75 mg by mouth daily.       Marland Kitchen lisinopril (PRINIVIL,ZESTRIL) 20 MG tablet Take 20 mg by  mouth daily.        . metFORMIN (GLUCOPHAGE) 500 MG tablet Take 1 tablet (500 mg total) by mouth 2 (two) times daily with a meal.  180 tablet  3  . Multiple Vitamin (MULTIVITAMIN) tablet Take 1 tablet by mouth daily.        . niacin (NIASPAN) 1000 MG CR tablet Take 1,000 mg by mouth at bedtime.        Marland Kitchen omeprazole (PRILOSEC) 20 MG capsule Take 20 mg by mouth daily.      . simvastatin (ZOCOR) 20 MG tablet Take 1 tablet (20 mg total) by mouth at bedtime.  90 tablet  3  . zolpidem (AMBIEN) 10 MG tablet Take 10 mg by mouth at bedtime as needed.         Orders Placed This Encounter  Procedures  . COMPLETE METABOLIC PANEL WITH GFR  . Hemoglobin A1c  . H. pylori antibody, IgG    Return in about 3 months (around 02/24/2012).

## 2011-11-24 NOTE — Assessment & Plan Note (Signed)
Well controlled on current medications.  No changes today. 

## 2011-11-24 NOTE — Patient Instructions (Addendum)
Please substitute the nexium for the omeprazole for one month to see if it makes a difference in your chest pain.  If it does not, resume omeprazole as needed for reflux.  You may also try pepcid ac or zantac as needed for reflux  If you want a second opinion on yor angina,  Let me know and we will refer you to Dr. Kirke Corin

## 2011-11-24 NOTE — Assessment & Plan Note (Signed)
He coninues to have chest pain with exertion despite a negative stress test and 75 mg of Imdur.  Suggested trial of nexium since he has concurrent GERD. If there is no imporvement will recommend a second opinion from Dr. Kirke Corin.

## 2011-11-25 MED ORDER — AMOXICILLIN 500 MG PO CAPS: 1000.0000 mg | ORAL_CAPSULE | Freq: Two times a day (BID) | ORAL | Status: AC

## 2011-11-25 MED ORDER — CLARITHROMYCIN 500 MG PO TABS: 500.0000 mg | ORAL_TABLET | Freq: Two times a day (BID) | ORAL | Status: AC

## 2011-11-25 NOTE — Addendum Note (Signed)
Addended by: Duncan Dull on: 11/25/2011 01:28 PM   Modules accepted: Orders

## 2011-11-25 NOTE — Assessment & Plan Note (Signed)
H Pylori positive.  Will treat

## 2011-12-10 DIAGNOSIS — M5380 Other specified dorsopathies, site unspecified: Secondary | ICD-10-CM | POA: Diagnosis not present

## 2011-12-10 DIAGNOSIS — M999 Biomechanical lesion, unspecified: Secondary | ICD-10-CM | POA: Diagnosis not present

## 2011-12-10 DIAGNOSIS — IMO0002 Reserved for concepts with insufficient information to code with codable children: Secondary | ICD-10-CM | POA: Diagnosis not present

## 2012-01-12 DIAGNOSIS — M5380 Other specified dorsopathies, site unspecified: Secondary | ICD-10-CM | POA: Diagnosis not present

## 2012-01-12 DIAGNOSIS — IMO0002 Reserved for concepts with insufficient information to code with codable children: Secondary | ICD-10-CM | POA: Diagnosis not present

## 2012-01-12 DIAGNOSIS — M999 Biomechanical lesion, unspecified: Secondary | ICD-10-CM | POA: Diagnosis not present

## 2012-02-11 DIAGNOSIS — IMO0002 Reserved for concepts with insufficient information to code with codable children: Secondary | ICD-10-CM | POA: Diagnosis not present

## 2012-02-11 DIAGNOSIS — M5380 Other specified dorsopathies, site unspecified: Secondary | ICD-10-CM | POA: Diagnosis not present

## 2012-02-11 DIAGNOSIS — M999 Biomechanical lesion, unspecified: Secondary | ICD-10-CM | POA: Diagnosis not present

## 2012-02-26 ENCOUNTER — Ambulatory Visit (INDEPENDENT_AMBULATORY_CARE_PROVIDER_SITE_OTHER): Payer: Medicare Other | Admitting: Internal Medicine

## 2012-02-26 ENCOUNTER — Encounter: Payer: Self-pay | Admitting: Internal Medicine

## 2012-02-26 VITALS — BP 134/66 | HR 54 | Temp 98.8°F | Resp 14 | Wt 188.5 lb

## 2012-02-26 DIAGNOSIS — I209 Angina pectoris, unspecified: Secondary | ICD-10-CM | POA: Diagnosis not present

## 2012-02-26 DIAGNOSIS — E1169 Type 2 diabetes mellitus with other specified complication: Secondary | ICD-10-CM | POA: Diagnosis not present

## 2012-02-26 DIAGNOSIS — E119 Type 2 diabetes mellitus without complications: Secondary | ICD-10-CM | POA: Diagnosis not present

## 2012-02-26 DIAGNOSIS — E1159 Type 2 diabetes mellitus with other circulatory complications: Secondary | ICD-10-CM

## 2012-02-26 DIAGNOSIS — E781 Pure hyperglyceridemia: Secondary | ICD-10-CM | POA: Diagnosis not present

## 2012-02-26 LAB — COMPREHENSIVE METABOLIC PANEL
ALT: 27 U/L (ref 0–53)
BUN: 15 mg/dL (ref 6–23)
CO2: 27 mEq/L (ref 19–32)
Calcium: 9.1 mg/dL (ref 8.4–10.5)
Chloride: 103 mEq/L (ref 96–112)
Creatinine, Ser: 1.2 mg/dL (ref 0.4–1.5)
GFR: 61.78 mL/min (ref >60.00)
Glucose, Bld: 119 mg/dL — ABNORMAL HIGH (ref 70–99)

## 2012-02-26 LAB — HEMOGLOBIN A1C: Hgb A1c MFr Bld: 6.2 % (ref 4.6–6.5)

## 2012-02-26 LAB — MICROALBUMIN / CREATININE URINE RATIO
Microalb Creat Ratio: 6.2 mg/g (ref 0.0–30.0)
Microalb, Ur: 10.2 mg/dL — ABNORMAL HIGH (ref 0.0–1.9)

## 2012-02-26 LAB — LIPID PANEL
Cholesterol: 138 mg/dL (ref 0–200)
HDL: 51.6 mg/dL (ref >39.00)
Triglycerides: 91 mg/dL (ref 0.0–149.0)
VLDL: 18.2 mg/dL (ref 0.0–40.0)

## 2012-02-26 MED ORDER — ZOLPIDEM TARTRATE 10 MG PO TABS: 10.0000 mg | ORAL_TABLET | Freq: Every evening | ORAL | Status: DC | PRN

## 2012-02-26 NOTE — Patient Instructions (Addendum)
Dont'r forget to get your annual eye exam if you have not.  Try taking your Imdur at night instead of in the morning to see if it resolves the exertional chest pain

## 2012-02-26 NOTE — Progress Notes (Signed)
Patient ID: Johnathan Arnold, male   DOB: 01/18/1946, 66 y.o.   MRN: [id number] Patient Active Problem List  Diagnosis  . Diabetes mellitus type 2, controlled  . 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 S/P CABG x 5  . Hypertriglyceridemia  . 3-vessel coronary artery disease    Subjective:  CC:   Chief Complaint  Patient presents with  . Follow-up    HPI:   Johnathan Solanki Hudsonis a 66 y.o. male who presents  For Three-month followup on diabetes , hypertension,  hyperlipidemia. He has a history of coronary artery disease and is status post 5 vessel bypass and has been having mild exertional chest pain for the past 6 months which does not escalate with continued exercise. His cardiologist has repeated his stress test in the last several months which was normal. His last cardiac catheterization was 2 years ago by Jamse Arnold which showed patent arteries and nonocclusive disease. He has increased his Imdur to 90 mg with no change in symptoms.  Trial of PPI di dnot change symptoms either. He does describe the chest pain is similar to the pain he was having before he had his coronary artery bypass. However his pain is not accompanied by diaphoresis nausea or radiation to jaw shoulder or arm. He continues to exercise regularly with 45 minutes of cardio aerobics at least 3 times a week. His diabetes is well-controlled. He has no claudication symptoms. He notes persistent fatigue which is worse in the morning in the afternoon. He has been told by his wife he snores. He is habitually a side sleeper but does change positions frequently. He has no trouble falling asleep during the week but uses Ambien on the weekends that he can sleep them. He has nocturia x2. He has had no prior sleep study. He uses Ambien on the weekends but not during the week.    Past Medical History  Diagnosis Date  . 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 Hypertriglyceridemia   . 3-vessel coronary artery disease     s/p  5  vessel CABG    Past Surgical History  Procedure Date  . Coronary artery bypass graft     5 vessel, [facility]         The following portions of the patient's history were reviewed and updated as appropriate: Allergies, current medications, and problem list.    Review of Systems:   12 Pt  review of systems was negative except those addressed in the HPI,     History   Social History  . Marital Status: Married    Spouse Name: N/A    Number of Children: N/A  . Years of Education: N/A   Occupational History  . Not on file.   Social History Main Topics  . Smoking status: Never Smoker   . Smokeless tobacco: Never Used  . Alcohol Use: No  . Drug Use: No  . Sexually Active: Not on file   Other Topics Concern  . Not on file   Social History Narrative  . No narrative on file    Objective:  BP 134/66  Pulse 54  Temp 98.8 F (37.1 C) (Oral)  Resp 14  Wt 188 lb 8 oz (85.503 kg)  SpO2 98%  General appearance: alert, cooperative and appears stated age Ears: normal TM's and external ear canals both ears Throat: lips, mucosa, and tongue normal; teeth and gums normal Neck: no adenopathy, no carotid bruit, supple, symmetrical, trachea midline and  thyroid not enlarged, symmetric, no tenderness/mass/nodules Back: symmetric, no curvature. ROM normal. No CVA tenderness. Lungs: clear to auscultation bilaterally Heart: regular rate and rhythm, S1, S2 normal, no murmur, click, rub or gallop Abdomen: soft, non-tender; bowel sounds normal; no masses,  no organomegaly Pulses: 2+ and symmetric Skin: Skin color, texture, turgor normal. No rashes or lesions Lymph nodes: Cervical, supraclavicular, and axillary nodes normal.  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 If this does not make a difference we discussed a second opinion from Dr. Kirke Corin.   Diabetes mellitus type 2, controlled Historically well controlled on minimal medications with diet and exercise helping. Repeat A1c was drawn today.  Hypertriglyceridemia Well controlled currently. No changes to medications. LDL is less than 70 and triglycerides are less than 150.   Updated Medication List Outpatient Encounter Prescriptions as of 02/26/2012  Medication Sig Dispense Refill  . aspirin 81 MG tablet Take 81 mg by mouth daily.        Marland Kitchen atenolol (TENORMIN) 50 MG tablet Take 50 mg by mouth daily.        . fish oil-omega-3 fatty acids 1000 MG capsule Take 2 g by mouth daily.        . isosorbide mononitrate (IMDUR) 30 MG 24 hr tablet Take 90 mg by mouth daily.       Marland Kitchen lisinopril (PRINIVIL,ZESTRIL) 20 MG tablet Take 20 mg by mouth daily.        . metFORMIN (GLUCOPHAGE) 500 MG tablet Take 1 tablet (500 mg total) by mouth 2 (two) times daily with a meal.  180 tablet  3  . Multiple Vitamin (MULTIVITAMIN) tablet Take 1 tablet by mouth daily.        . niacin (NIASPAN) 1000 MG CR tablet Take 1,000 mg by mouth at bedtime.        Marland Kitchen omeprazole (PRILOSEC) 20 MG capsule Take 20 mg by mouth daily.      . simvastatin (ZOCOR) 20 MG tablet Take 1 tablet (20 mg total) by mouth at bedtime.  90 tablet  3  . zolpidem (AMBIEN) 10 MG tablet Take 1 tablet (10 mg total) by mouth at bedtime as needed.  30 tablet  2  . DISCONTD: zolpidem (AMBIEN) 10 MG tablet Take 10 mg by mouth at bedtime as needed.         Orders Placed This Encounter  Procedures  . Lipid panel  . Hemoglobin A1c  . Comprehensive metabolic panel  . Microalbumin / creatinine urine ratio    No Follow-up on file.

## 2012-02-27 ENCOUNTER — Encounter: Payer: Self-pay | Admitting: Internal Medicine

## 2012-02-27 NOTE — Assessment & Plan Note (Signed)
Historically well controlled on minimal medications with diet and exercise helping. Repeat A1c was drawn today.

## 2012-02-27 NOTE — Assessment & Plan Note (Signed)
Wants to avoid the additional invasive testing if possible but is troubled by his continued pain I suggested that he take his Imdur at night since currently he is taking about 30 minutes before he starts exercising. If this does not make a difference we discussed a second opinion from Dr. Kirke Corin.

## 2012-02-27 NOTE — Assessment & Plan Note (Signed)
Well controlled currently. No changes to medications. LDL is less than 70 and triglycerides are less than 150.

## 2012-03-12 DIAGNOSIS — M999 Biomechanical lesion, unspecified: Secondary | ICD-10-CM | POA: Diagnosis not present

## 2012-03-12 DIAGNOSIS — IMO0002 Reserved for concepts with insufficient information to code with codable children: Secondary | ICD-10-CM | POA: Diagnosis not present

## 2012-03-12 DIAGNOSIS — M5380 Other specified dorsopathies, site unspecified: Secondary | ICD-10-CM | POA: Diagnosis not present

## 2012-03-16 ENCOUNTER — Encounter: Payer: Self-pay | Admitting: Internal Medicine

## 2012-03-16 ENCOUNTER — Other Ambulatory Visit: Payer: Self-pay

## 2012-03-16 ENCOUNTER — Ambulatory Visit (INDEPENDENT_AMBULATORY_CARE_PROVIDER_SITE_OTHER): Payer: Medicare Other | Admitting: Internal Medicine

## 2012-03-16 VITALS — BP 130/62 | HR 76 | Temp 99.7°F | Ht 66.0 in | Wt 190.0 lb

## 2012-03-16 DIAGNOSIS — M549 Dorsalgia, unspecified: Secondary | ICD-10-CM | POA: Diagnosis not present

## 2012-03-16 DIAGNOSIS — N410 Acute prostatitis: Secondary | ICD-10-CM

## 2012-03-16 LAB — POCT URINALYSIS DIPSTICK
Glucose, UA: NEGATIVE
Nitrite, UA: NEGATIVE
Urobilinogen, UA: 1
pH, UA: 5.5

## 2012-03-16 MED ORDER — CIPROFLOXACIN HCL 500 MG PO TABS: 500.0000 mg | ORAL_TABLET | Freq: Two times a day (BID) | ORAL | Status: DC

## 2012-03-16 NOTE — Progress Notes (Signed)
Patient ID: Johnathan Arnold, male   DOB: [birth year], 66 y.o.   MRN: [id number]  Patient Active Problem List  Diagnosis  . Diabetes mellitus type 2, controlled  . 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 S/P CABG x 5  . Hypertriglyceridemia  . 3-vessel coronary artery disease  . Prostatitis, acute    Subjective:  CC:   No chief complaint on file.   HPI:   Johnathan Safko Hudsonis a 66 y.o. male who presents with Urinary frequency x 3 days with Nocturia x 4 and a sense of Incomplete emptying. Low grade fever to 101 .  Low back pain , no recent travel .  No nausea.  No blood in urine. Prior history of prostatitis. Low grade headache with infrequent cough. No URI symptoms.   Past Medical History  Diagnosis Date  . 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 Hypertriglyceridemia   . 3-vessel coronary artery disease     s/p  5 vessel CABG    Past Surgical History  Procedure Date  . Coronary artery bypass graft     5 vessel, [facility]         The following portions of the patient's history were reviewed and updated as appropriate: Allergies, current medications, and problem list.    Review of Systems:   A comprehensive ROS was done and negative for nausea.   See HPI for pertinent positives.    History   Social History  . Marital Status: Married    Spouse Name: Johnathan Arnold    Number of Children: Johnathan Arnold  . Years of Education: Johnathan Arnold   Occupational History  . Not on file.   Social History Main Topics  . Smoking status: Never Smoker   . Smokeless tobacco: Never Used  . Alcohol Use: No  . Drug Use: No  . Sexually Active: Not on file   Other Topics Concern  . Not on file   Social History Narrative  . No narrative on file    Objective:  BP 130/62  Pulse 76  Temp 99.7 F (37.6 C) (Oral)  Ht 5\' 6"  (1.676 m)  Wt 190 lb (86.183 kg)  BMI 30.67 kg/m2  SpO2 97%  General appearance: alert, cooperative and appears stated age Ears: normal TM's and external ear canals both  ears Throat: lips, mucosa, and tongue normal; teeth and gums normal Neck: no adenopathy, no carotid bruit, supple, symmetrical, trachea midline and thyroid not enlarged, symmetric, no tenderness/mass/nodules Back: symmetric, no curvature. ROM normal. No CVA tenderness. Lungs: clear to auscultation bilaterally Heart: regular rate and rhythm, S1, S2 normal, no murmur, click, rub or gallop Abdomen: soft, non-tender; bowel sounds normal; no masses,  no organomegaly Pulses: 2+ and symmetric Skin: Skin color, texture, turgor normal. No rashes or lesions Lymph nodes: Cervical, supraclavicular, and axillary nodes normal.  Assessment and Plan:  Prostatitis, acute Secondary to URI, suggested by history exam and UA. Ciprofloxacin 500 mg twice a day x2 weeks.   Updated Medication List Outpatient Encounter Prescriptions as of 03/16/2012  Medication Sig Dispense Refill  . aspirin 81 MG tablet Take 81 mg by mouth daily.        Marland Kitchen atenolol (TENORMIN) 50 MG tablet Take 50 mg by mouth daily.        . fish oil-omega-3 fatty acids 1000 MG capsule Take 2 g by mouth daily.        . isosorbide mononitrate (IMDUR) 30 MG 24 hr tablet Take 90 mg by mouth daily.       Marland Kitchen  lisinopril (PRINIVIL,ZESTRIL) 20 MG tablet Take 20 mg by mouth daily.        . metFORMIN (GLUCOPHAGE) 500 MG tablet Take 1 tablet (500 mg total) by mouth 2 (two) times daily with a meal.  180 tablet  3  . Multiple Vitamin (MULTIVITAMIN) tablet Take 1 tablet by mouth daily.        . niacin (NIASPAN) 1000 MG CR tablet Take 1,000 mg by mouth at bedtime.        Marland Kitchen omeprazole (PRILOSEC) 20 MG capsule Take 20 mg by mouth daily.      . simvastatin (ZOCOR) 20 MG tablet Take 1 tablet (20 mg total) by mouth at bedtime.  90 tablet  3  . zolpidem (AMBIEN) 10 MG tablet Take 1 tablet (10 mg total) by mouth at bedtime as needed.  30 tablet  2  . ciprofloxacin (CIPRO) 500 MG tablet Take 1 tablet (500 mg total) by mouth 2 (two) times daily.  28 tablet  0      Orders Placed This Encounter  Procedures  . Urine culture  . Urinalysis Dipstick    No Follow-up on file.

## 2012-03-17 ENCOUNTER — Encounter: Payer: Self-pay | Admitting: Internal Medicine

## 2012-03-17 DIAGNOSIS — N410 Acute prostatitis: Secondary | ICD-10-CM | POA: Insufficient documentation

## 2012-03-17 NOTE — Assessment & Plan Note (Signed)
Secondary to URI, suggested by history exam and UA. Ciprofloxacin 500 mg twice a day x2 weeks.

## 2012-03-18 ENCOUNTER — Encounter: Payer: Self-pay | Admitting: Internal Medicine

## 2012-03-18 LAB — URINE CULTURE: Organism ID, Bacteria: NO GROWTH

## 2012-04-09 DIAGNOSIS — IMO0002 Reserved for concepts with insufficient information to code with codable children: Secondary | ICD-10-CM | POA: Diagnosis not present

## 2012-04-09 DIAGNOSIS — M999 Biomechanical lesion, unspecified: Secondary | ICD-10-CM | POA: Diagnosis not present

## 2012-04-09 DIAGNOSIS — M5380 Other specified dorsopathies, site unspecified: Secondary | ICD-10-CM | POA: Diagnosis not present

## 2012-04-23 ENCOUNTER — Other Ambulatory Visit: Payer: Self-pay

## 2012-04-23 MED ORDER — METFORMIN HCL 500 MG PO TABS: 500.0000 mg | ORAL_TABLET | Freq: Two times a day (BID) | ORAL | Status: DC

## 2012-04-23 NOTE — Telephone Encounter (Signed)
Refill request for Metformin 500 mg #[id number] R sent electronic to [facility] .

## 2012-05-12 DIAGNOSIS — H2510 Age-related nuclear cataract, unspecified eye: Secondary | ICD-10-CM | POA: Diagnosis not present

## 2012-05-17 DIAGNOSIS — M999 Biomechanical lesion, unspecified: Secondary | ICD-10-CM | POA: Diagnosis not present

## 2012-05-17 DIAGNOSIS — M5380 Other specified dorsopathies, site unspecified: Secondary | ICD-10-CM | POA: Diagnosis not present

## 2012-05-17 DIAGNOSIS — IMO0002 Reserved for concepts with insufficient information to code with codable children: Secondary | ICD-10-CM | POA: Diagnosis not present

## 2012-05-23 LAB — HM DIABETES EYE EXAM: HM Diabetic Eye Exam: NORMAL

## 2012-05-26 DIAGNOSIS — R0989 Other specified symptoms and signs involving the circulatory and respiratory systems: Secondary | ICD-10-CM | POA: Diagnosis not present

## 2012-05-26 DIAGNOSIS — R0789 Other chest pain: Secondary | ICD-10-CM | POA: Diagnosis not present

## 2012-05-26 DIAGNOSIS — I2510 Atherosclerotic heart disease of native coronary artery without angina pectoris: Secondary | ICD-10-CM | POA: Diagnosis not present

## 2012-05-26 DIAGNOSIS — R0609 Other forms of dyspnea: Secondary | ICD-10-CM | POA: Diagnosis not present

## 2012-06-10 ENCOUNTER — Telehealth: Payer: Self-pay | Admitting: Internal Medicine

## 2012-06-10 NOTE — Telephone Encounter (Signed)
Error

## 2012-06-11 DIAGNOSIS — M5380 Other specified dorsopathies, site unspecified: Secondary | ICD-10-CM | POA: Diagnosis not present

## 2012-06-11 DIAGNOSIS — M999 Biomechanical lesion, unspecified: Secondary | ICD-10-CM | POA: Diagnosis not present

## 2012-06-11 DIAGNOSIS — IMO0002 Reserved for concepts with insufficient information to code with codable children: Secondary | ICD-10-CM | POA: Diagnosis not present

## 2012-06-16 DIAGNOSIS — I2510 Atherosclerotic heart disease of native coronary artery without angina pectoris: Secondary | ICD-10-CM | POA: Diagnosis not present

## 2012-06-23 ENCOUNTER — Ambulatory Visit: Payer: Self-pay | Admitting: Internal Medicine

## 2012-06-23 ENCOUNTER — Ambulatory Visit (INDEPENDENT_AMBULATORY_CARE_PROVIDER_SITE_OTHER): Payer: Medicare Other | Admitting: Internal Medicine

## 2012-06-23 ENCOUNTER — Encounter: Payer: Self-pay | Admitting: Internal Medicine

## 2012-06-23 ENCOUNTER — Telehealth: Payer: Self-pay

## 2012-06-23 VITALS — BP 130/68 | HR 53 | Temp 97.7°F | Resp 16 | Ht 66.0 in | Wt 190.0 lb

## 2012-06-23 DIAGNOSIS — Z0000 Encounter for general adult medical examination without abnormal findings: Secondary | ICD-10-CM | POA: Diagnosis not present

## 2012-06-23 DIAGNOSIS — M25559 Pain in unspecified hip: Secondary | ICD-10-CM | POA: Diagnosis not present

## 2012-06-23 DIAGNOSIS — M25552 Pain in left hip: Secondary | ICD-10-CM

## 2012-06-23 DIAGNOSIS — Z125 Encounter for screening for malignant neoplasm of prostate: Secondary | ICD-10-CM

## 2012-06-23 DIAGNOSIS — E119 Type 2 diabetes mellitus without complications: Secondary | ICD-10-CM | POA: Insufficient documentation

## 2012-06-23 DIAGNOSIS — I209 Angina pectoris, unspecified: Secondary | ICD-10-CM

## 2012-06-23 DIAGNOSIS — E118 Type 2 diabetes mellitus with unspecified complications: Secondary | ICD-10-CM

## 2012-06-23 DIAGNOSIS — I2510 Atherosclerotic heart disease of native coronary artery without angina pectoris: Secondary | ICD-10-CM

## 2012-06-23 DIAGNOSIS — E1169 Type 2 diabetes mellitus with other specified complication: Secondary | ICD-10-CM

## 2012-06-23 DIAGNOSIS — E1159 Type 2 diabetes mellitus with other circulatory complications: Secondary | ICD-10-CM

## 2012-06-23 LAB — COMPREHENSIVE METABOLIC PANEL
ALT: 24 U/L (ref 0–53)
AST: 22 U/L (ref 0–37)
Alkaline Phosphatase: 52 U/L (ref 39–117)
Calcium: 9.9 mg/dL (ref 8.4–10.5)
Chloride: 102 mEq/L (ref 96–112)
Creatinine, Ser: 1.3 mg/dL (ref 0.4–1.5)
Potassium: 4.3 mEq/L (ref 3.5–5.1)

## 2012-06-23 LAB — LIPID PANEL
HDL: 59.5 mg/dL (ref >39.00)
LDL Cholesterol: 62 mg/dL (ref 0–99)
Total CHOL/HDL Ratio: 2
VLDL: 23 mg/dL (ref 0.0–40.0)

## 2012-06-23 LAB — HEMOGLOBIN A1C: Hgb A1c MFr Bld: 6.5 % (ref 4.6–6.5)

## 2012-06-23 NOTE — Patient Instructions (Addendum)
Referral to Dr Kirke Corin underway Hip  X ray left side

## 2012-06-23 NOTE — Telephone Encounter (Signed)
See Dr. Melina Schools note Pt had positive stress test at [facility] 1/8 Continues to have CP- on imdur Has not gotten results from [facility] Wants to change cardiologists Offered Dr. Mariah Milling appt  But pt wants to see you Is next Thursday too far out for you? Do you want me to work him in this Friday?

## 2012-06-23 NOTE — Assessment & Plan Note (Signed)
Well controlled on current regimen. Renal function stable, no changes today until hgba1c can be reviewed,  Reminder for eye exam given,  Foot exam normal today .  On appropriate medications

## 2012-06-23 NOTE — Progress Notes (Signed)
Patient ID: Johnathan Arnold, male   DOB: 03-07-1946, 67 y.o.   MRN: [id number]  The patient is here for annual Medicare wellness examination and management of other chronic and acute problems. Annual exam and management of chronic issues including CAD, DM and hyperlipidemia.  He had a nuc med stress test at [facility] for evaluation or recurrent angina since 2011 catheterization ich showed a reversible perfusion defect in the septal wall.  This was done on Jan 8th, I received results yesterday.  Patient has not received an update on results from Dr. Darrold Junker. His chest pain is intermittent,  not occurring daily, and does not occur with mild workouts  but does with more aggressive workouts and had it during the stress test.   He has a history of 3 vessel CAD, s/p CABG remotely and is requesting referral to Dr. Kirke Corin.    2) Left hip pain .  Brought on by crossing legs for any period of time,  When he uncrosses them and stands up. Has 5 to 8 minutes of moderate left lateral pain.  Also notes some pain with climbing stairs.  Taking aleve.  3) DM: historically well controlled, does not check sugars.  Gets annual eye exams , not sure when last one was     The risk factors are reflected in the social history.  The roster of all physicians providing medical care to patient - is listed in the Snapshot section of the chart.  Activities of daily living:  The patient is 100% independent in all ADLs: dressing, toileting, feeding as well as independent mobility  Home safety : The patient has smoke detectors in the home. They wear seatbelts.  There are no firearms at home. There is no violence in the home.   There is no risks for hepatitis, STDs or HIV. There is no   history of blood transfusion. They have no travel history to infectious disease endemic areas of the world.  The patient has seen their dentist in the last six month. They have seen their eye doctor in the last year. They admit to slight hearing  difficulty with regard to whispered voices and some television programs.  They have deferred audiologic testing in the last year.  They do not  have excessive sun exposure. Discussed the need for sun protection: hats, long sleeves and use of sunscreen if there is significant sun exposure.   Diet: the importance of a healthy diet is discussed. They do have a healthy diet.  The benefits of regular aerobic exercise were discussed. She walks 4 times per week ,  20 minutes.   Depression screen: there are no signs or vegative symptoms of depression- irritability, change in appetite, anhedonia, sadness/tearfullness.  Cognitive assessment: the patient manages all their financial and personal affairs and is actively engaged. They could relate day,date,year and events; recalled 2/3 objects at 3 minutes; performed clock-face test normally.  The following portions of the patient's history were reviewed and updated as appropriate: allergies, current medications, past family history, past medical history,  past surgical history, past social history  and problem list.  Visual acuity was not assessed per patient preference since she has regular follow up with her ophthalmologist. Hearing and body mass index were assessed and reviewed.   During the course of the visit the patient was educated and counseled about appropriate screening and preventive services including : fall prevention , diabetes screening, nutrition counseling, colorectal cancer screening, and recommended immunizations.    BP 130/68  Pulse 53  Temp 97.7 F (36.5 C) (Oral)  Resp 16  Ht 5\' 6"  (1.676 m)  Wt 190 lb (86.183 kg)  BMI 30.67 kg/m2  SpO2 98%  General Appearance:    Alert, cooperative, no distress, appears stated age  Head:    Normocephalic, without obvious abnormality, atraumatic  Eyes:    PERRL, conjunctiva/corneas clear, EOM's intact, fundi    benign, both eyes       Ears:    Normal TM's and external ear canals, both ears    Nose:   Nares normal, septum midline, mucosa normal, no drainage   or sinus tenderness  Throat:   Lips, mucosa, and tongue normal; teeth and gums normal  Neck:   Supple, symmetrical, trachea midline, no adenopathy;       thyroid:  No enlargement/tenderness/nodules; no carotid   bruit or JVD  Back:     Symmetric, no curvature, ROM normal, no CVA tenderness  Lungs:     Clear to auscultation bilaterally, respirations unlabored  Chest wall:    No tenderness or deformity  Heart:    Regular rate and rhythm, S1 and S2 normal, no murmur, rub   or gallop  Abdomen:     Soft, non-tender, bowel sounds active all four quadrants,    no masses, no organomegaly  Genitalia:    Normal male without lesion, discharge or tenderness  Rectal:    Normal tone, normal prostate, no masses or tenderness;   guaiac negative stool  Extremities:   Extremities normal, atraumatic, no cyanosis or edema  Pulses:   2+ and symmetric all extremities  Skin:   Skin color, texture, turgor normal, no rashes or lesions  Lymph nodes:   Cervical, supraclavicular, and axillary nodes normal  Neurologic:   CNII-XII intact. 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  Referral to [facility] as patient is dissatisfied with [facility].  Patient takes Imdur daily,  Advised to take NTG for next episode of chest discomfort.   3-vessel coronary artery disease 100% or near 100% stenosis of LAD, the prox cx, and RCA by 2011 cath report.  He is s/p 5 vessel CABG remotely.  Last cath 2011. nuc med stress test done Jan 2013 for recurrent angina,  abnormal   Diabetes mellitus type 2, controlled Well controlled on current regimen. Renal function stable, no changes today until hgba1c can be reviewed,  Reminder for eye exam given,  Foot exam normal today .  On appropriate  medications    Updated Medication List Outpatient Encounter Prescriptions as of 06/23/2012  Medication Sig Dispense Refill  . aspirin 81 MG tablet Take 81 mg by mouth daily.        Marland Kitchen atenolol (TENORMIN) 50 MG tablet Take 50 mg by mouth daily.        . fish oil-omega-3 fatty acids 1000 MG capsule Take 2 g by mouth daily.        . isosorbide mononitrate (IMDUR) 30 MG 24 hr tablet Take 30 mg by mouth daily. Pt takes 90 mg total. 1 30mg  and 1 60 mg tablet daily.      . isosorbide mononitrate (IMDUR) 60 MG 24 hr tablet Take 60 mg by mouth daily. Pt takes 90 mg daily. 1 30 mg tablet and 1 60 mg tablet daily.      Marland Kitchen  lisinopril (PRINIVIL,ZESTRIL) 20 MG tablet Take 20 mg by mouth daily.        . metFORMIN (GLUCOPHAGE) 500 MG tablet Take 1 tablet (500 mg total) by mouth 2 (two) times daily with a meal.  180 tablet  3  . Multiple Vitamin (MULTIVITAMIN) tablet Take 1 tablet by mouth daily.        . niacin (NIASPAN) 1000 MG CR tablet Take 1,000 mg by mouth at bedtime.        Marland Kitchen omeprazole (PRILOSEC) 20 MG capsule Take 20 mg by mouth daily.      . simvastatin (ZOCOR) 20 MG tablet Take 1 tablet (20 mg total) by mouth at bedtime.  90 tablet  3  . zolpidem (AMBIEN) 10 MG tablet Take 1 tablet (10 mg total) by mouth at bedtime as needed.  30 tablet  2  . [DISCONTINUED] ciprofloxacin (CIPRO) 500 MG tablet Take 1 tablet (500 mg total) by mouth 2 (two) times daily.  28 tablet  0

## 2012-06-23 NOTE — Assessment & Plan Note (Signed)
100% or near 100% stenosis of LAD, the prox cx, and RCA by 2011 cath report.  He is s/p 5 vessel CABG remotely.  Last cath 2011. nuc med stress test done Jan 2013 for recurrent angina,  abnormal

## 2012-06-23 NOTE — Assessment & Plan Note (Signed)
Abnormal nuc med stud Jan 8 at [place] with reversible defect septal wall distribution.  Referral to [facility] as patient is dissatisfied with [facility].  Patient takes Imdur daily,  Advised to take NTG for next episode of chest discomfort.

## 2012-06-23 NOTE — Telephone Encounter (Signed)
It seems that stress test was abnormal but not high risk. Next Thursday should be ok.

## 2012-06-23 NOTE — Telephone Encounter (Signed)
Will keep for 1/23

## 2012-06-24 ENCOUNTER — Telehealth: Payer: Self-pay | Admitting: Internal Medicine

## 2012-06-24 ENCOUNTER — Encounter: Payer: Self-pay | Admitting: *Deleted

## 2012-06-24 NOTE — Telephone Encounter (Signed)
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  If his pain is not adequately managed with oral nonsteroidal medications, a hip steroid injection may help, and can be done by an orthopedist. Happy to refer if he would like

## 2012-06-25 ENCOUNTER — Telehealth: Payer: Self-pay | Admitting: Internal Medicine

## 2012-06-25 NOTE — Telephone Encounter (Signed)
Patient returning your call.

## 2012-06-25 NOTE — Telephone Encounter (Signed)
LMOVM for pt to return call 

## 2012-06-25 NOTE — Telephone Encounter (Signed)
Pt notified that if the medications do not help Dr. Darrick Huntsman can place a referral for pt. Pt acknowledges this and stated he will try the medication for the time being.

## 2012-06-28 ENCOUNTER — Encounter: Payer: Self-pay | Admitting: Internal Medicine

## 2012-06-29 ENCOUNTER — Ambulatory Visit: Payer: Medicare Other | Admitting: Cardiovascular Disease

## 2012-07-01 ENCOUNTER — Encounter: Payer: Self-pay | Admitting: Cardiovascular Disease

## 2012-07-01 ENCOUNTER — Ambulatory Visit (INDEPENDENT_AMBULATORY_CARE_PROVIDER_SITE_OTHER): Payer: Medicare Other | Admitting: Cardiovascular Disease

## 2012-07-01 VITALS — BP 162/68 | HR 57 | Ht 67.0 in | Wt 194.5 lb

## 2012-07-01 DIAGNOSIS — R079 Chest pain, unspecified: Secondary | ICD-10-CM

## 2012-07-01 DIAGNOSIS — I10 Essential (primary) hypertension: Secondary | ICD-10-CM | POA: Diagnosis not present

## 2012-07-01 DIAGNOSIS — I2510 Atherosclerotic heart disease of native coronary artery without angina pectoris: Secondary | ICD-10-CM | POA: Diagnosis not present

## 2012-07-01 MED ORDER — RANOLAZINE ER 500 MG PO TB12: 500.0000 mg | ORAL_TABLET | Freq: Two times a day (BID) | ORAL | Status: DC

## 2012-07-01 NOTE — Progress Notes (Signed)
HPI  This is a pleasant 67 year old male who is here today to establish cardiovascular care. He is transferring care from Dr. Darrold Junker. He has known history of coronary artery disease status post CABG in 1999 by Dr. Cornelius Moras. He also has known history of hypertension, hyperlipidemia and type 2 diabetes. He has done well from a cardiac standpoint up until 2 years ago when he started having mild exertional chest pain. He underwent a cardiac catheterization in 2011 which showed patent grafts. He continued to have mild exertional chest pain. He underwent a treadmill stress test in June of 2013 which showed no evidence of ischemia. He was able to exercise for 9-1/2 minutes with mild chest discomfort but no ischemic ECG changes. He underwent a treadmill nuclear stress test early this month which showed normal ejection fraction. Fixed inferior wall defect with normal wall motion and moderate reversibility in the septal wall.  The patient exercises regularly at the heart track for about 45 minutes. He denies dyspnea, orthopnea or PND.  No Known Allergies   Current Outpatient Prescriptions on File Prior to Visit  Medication Sig Dispense Refill  . aspirin 81 MG tablet Take by mouth. Takes 2 tablets daily.      Marland Kitchen atenolol (TENORMIN) 50 MG tablet Take 50 mg by mouth daily.        . fish oil-omega-3 fatty acids 1000 MG capsule Take 2 g by mouth daily.        . isosorbide mononitrate (IMDUR) 30 MG 24 hr tablet Take 30 mg by mouth daily. Pt takes 90 mg total. 1 30mg  and 1 60 mg tablet daily.      . isosorbide mononitrate (IMDUR) 60 MG 24 hr tablet Take 60 mg by mouth daily. Pt takes 90 mg daily. 1 30 mg tablet and 1 60 mg tablet daily.      Marland Kitchen lisinopril (PRINIVIL,ZESTRIL) 20 MG tablet Take 20 mg by mouth daily.        . metFORMIN (GLUCOPHAGE) 500 MG tablet Take 1 tablet (500 mg total) by mouth 2 (two) times daily with a meal.  180 tablet  3  . Multiple Vitamin (MULTIVITAMIN) tablet Take 1 tablet by mouth daily.         . niacin (NIASPAN) 500 MG CR tablet Takes 3 tablets daily.      . nitroGLYCERIN (NITROSTAT) 0.4 MG SL tablet Place 0.4 mg under the tongue every 5 (five) minutes as needed.      Marland Kitchen omeprazole (PRILOSEC) 20 MG capsule Take 20 mg by mouth daily.      . simvastatin (ZOCOR) 20 MG tablet Take 1 tablet (20 mg total) by mouth at bedtime.  90 tablet  3  . zolpidem (AMBIEN) 5 MG tablet Take 5 mg by mouth at bedtime as needed.      . ranolazine (RANEXA) 500 MG 12 hr tablet Take 1 tablet (500 mg total) by mouth 2 (two) times daily.  60 tablet  0     Past Medical History  Diagnosis Date  . 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 Hypertriglyceridemia   . Diabetes mellitus without complication   . History of cardiac catheterization 2011    [facility]  . 3-vessel coronary artery disease     s/p  5 vessel CABG     Past Surgical History  Procedure Date  . Coronary artery bypass graft 03/1998    5 vessel, [facility]  . Cardiac catheterization 05-19-2010    [facility]: Patent grafts  Family History  Problem Relation Age of Onset  . Heart attack Mother 62  . Diabetes Mother   . Hypertension Mother   . Heart attack Father 29  . Heart disease Father      History   Social History  . Marital Status: Married    Spouse Name: N/A    Number of Children: N/A  . Years of Education: N/A   Occupational History  . Not on file.   Social History Main Topics  . Smoking status: Never Smoker   . Smokeless tobacco: Never Used  . Alcohol Use: No  . Drug Use: No  . Sexually Active: Not on file   Other Topics Concern  . Not on file   Social History Narrative  . No narrative on file     ROS Constitutional: Negative for fever, chills, diaphoresis, activity change, appetite change and fatigue.  HENT: Negative for hearing loss, nosebleeds, congestion, sore throat, facial swelling, drooling, trouble swallowing, neck pain, voice change, sinus pressure and tinnitus.  Eyes: Negative for photophobia, pain, discharge  and visual disturbance.  Respiratory: Negative for apnea, cough, shortness of breath and wheezing.  Cardiovascular: Negative for  palpitations and leg swelling.  Gastrointestinal: Negative for nausea, vomiting, abdominal pain, diarrhea, constipation, blood in stool and abdominal distention.  Genitourinary: Negative for dysuria, urgency, frequency, hematuria and decreased urine volume.  Musculoskeletal: Negative for myalgias, back pain, joint swelling, arthralgias and gait problem.  Skin: Negative for color change, pallor, rash and wound.  Neurological: Negative for dizziness, tremors, seizures, syncope, speech difficulty, weakness, light-headedness, numbness and headaches.  Psychiatric/Behavioral: Negative for suicidal ideas, hallucinations, behavioral problems and agitation. The patient is not nervous/anxious.     PHYSICAL EXAM   BP 162/68  Pulse 57  Ht 5\' 7"  (1.702 m)  Wt 194 lb 8 oz (88.225 kg)  BMI 30.46 kg/m2 Constitutional: He is oriented to person, place, and time. He appears well-developed and well-nourished. No distress.  HENT: No nasal discharge.  Head: Normocephalic and atraumatic.  Eyes: Pupils are equal and round. Right eye exhibits no discharge. Left eye exhibits no discharge.  Neck: Normal range of motion. Neck supple. No JVD present. No thyromegaly present.  Cardiovascular: Normal rate, regular rhythm, normal heart sounds and. Exam reveals no gallop and no friction rub. No murmur heard.  Pulmonary/Chest: Effort normal and breath sounds normal. No stridor. No respiratory distress. He has no wheezes. He has no rales. He exhibits no tenderness.  Abdominal: Soft. Bowel sounds are normal. He exhibits no distension. There is no tenderness. There is no rebound and no guarding.  Musculoskeletal: Normal range of motion. He exhibits no edema and no tenderness.  Neurological: He is alert and oriented to person, place, and time. Coordination normal.  Skin: Skin is warm and dry. No  rash noted. He is not diaphoretic. No erythema. No pallor.  Psychiatric: He has a normal mood and affect. His behavior is normal. Judgment and thought content normal.       EKG: Sinus  Bradycardia  -Old anteroseptal infarct.   ABNORMAL    ASSESSMENT AND PLAN

## 2012-07-01 NOTE — Assessment & Plan Note (Signed)
His blood pressure is currently not well controlled. He reports better readings than this. During the recent stress test, he had a hypertensive response to exercise with systolic blood pressure of 220. Thus, this actually might be contributing to his exertional chest pain. I will consider making further changes as outlined above.

## 2012-07-01 NOTE — Patient Instructions (Addendum)
Start Ranexa 500 mg twice daily.  Continue other medications.  Follow up in 1 month.

## 2012-07-01 NOTE — Assessment & Plan Note (Signed)
The patient has known history of coronary artery disease status post CABG in 1999. His current symptoms are consistent with class II angina. He is able to do all activities of daily living and exercises on a regular basis. His recent nuclear stress test was mildly abnormal with septal wall ischemia which is seen commonly post CABG which is expected in this situation given that his LAD was occluded and a retrograde LIMA flow does not seem to be going back to the proximal LAD distribution according to his most recent catheterization. The fixed inferior wall defect is likely due to diaphragm attenuation given that he had normal wall motion. Given that his symptoms are overall mild, I recommend optimizing his medical therapy before considering proceeding with repeat cardiac catheterization. I will start him today on Ranexa 500 mg twice daily. If his blood pressure remains elevated, I recommend increasing the dose of lisinopril and possibly switching atenolol to carvedilol. I will have him followup with me in one month.

## 2012-07-02 ENCOUNTER — Encounter: Payer: Self-pay | Admitting: *Deleted

## 2012-07-02 ENCOUNTER — Encounter: Payer: Self-pay | Admitting: Internal Medicine

## 2012-07-05 ENCOUNTER — Encounter: Payer: Self-pay | Admitting: Internal Medicine

## 2012-07-12 DIAGNOSIS — M5380 Other specified dorsopathies, site unspecified: Secondary | ICD-10-CM | POA: Diagnosis not present

## 2012-07-12 DIAGNOSIS — IMO0002 Reserved for concepts with insufficient information to code with codable children: Secondary | ICD-10-CM | POA: Diagnosis not present

## 2012-07-12 DIAGNOSIS — M999 Biomechanical lesion, unspecified: Secondary | ICD-10-CM | POA: Diagnosis not present

## 2012-07-21 ENCOUNTER — Encounter: Payer: Self-pay | Admitting: Cardiovascular Disease

## 2012-08-03 ENCOUNTER — Ambulatory Visit (INDEPENDENT_AMBULATORY_CARE_PROVIDER_SITE_OTHER): Payer: Medicare Other | Admitting: Cardiovascular Disease

## 2012-08-03 ENCOUNTER — Encounter: Payer: Self-pay | Admitting: Cardiovascular Disease

## 2012-08-03 VITALS — BP 142/68 | HR 56 | Ht 67.0 in | Wt 194.2 lb

## 2012-08-03 DIAGNOSIS — I499 Cardiac arrhythmia, unspecified: Secondary | ICD-10-CM | POA: Diagnosis not present

## 2012-08-03 DIAGNOSIS — I2510 Atherosclerotic heart disease of native coronary artery without angina pectoris: Secondary | ICD-10-CM

## 2012-08-03 DIAGNOSIS — I10 Essential (primary) hypertension: Secondary | ICD-10-CM

## 2012-08-03 DIAGNOSIS — I459 Conduction disorder, unspecified: Secondary | ICD-10-CM

## 2012-08-03 DIAGNOSIS — E785 Hyperlipidemia, unspecified: Secondary | ICD-10-CM

## 2012-08-03 MED ORDER — RANOLAZINE ER 500 MG PO TB12: 500.0000 mg | ORAL_TABLET | Freq: Two times a day (BID) | ORAL | Status: DC

## 2012-08-03 MED ORDER — LISINOPRIL 40 MG PO TABS: 40.0000 mg | ORAL_TABLET | Freq: Every day | ORAL | Status: DC

## 2012-08-03 NOTE — Assessment & Plan Note (Signed)
His blood pressure is still mildly elevated. Thus, I will increase the dose of lisinopril to 40 mg once daily.

## 2012-08-03 NOTE — Assessment & Plan Note (Signed)
The patient has known history of coronary artery disease status post CABG in 1999. He had residual mild angina with septal ischemia noted a nuclear stress test. His symptoms improved significantly after the addition of Ranexa which will be continued at the current dose of 500 mg once daily. Continue other cardiac medications.

## 2012-08-03 NOTE — Assessment & Plan Note (Signed)
Continue treatment with simvastatin 20 mg daily.  Lab Results  Component Value Date   CHOL 144 06/23/2012   HDL 59.50 06/23/2012   LDLCALC 62 06/23/2012   TRIG 115.0 06/23/2012   CHOLHDL 2 06/23/2012

## 2012-08-03 NOTE — Patient Instructions (Addendum)
Increase Lisinopril to 40 mg once daily.  Continue other medications.  Follow up in 6 months.

## 2012-08-03 NOTE — Progress Notes (Signed)
HPI  This is a pleasant 67 year old male who is here today for a followup visit. He transferred his care recently from Dr. Darrold Junker. He has known history of coronary artery disease status post CABG in 1999 by Dr. Cornelius Moras. He also has known history of hypertension, hyperlipidemia and type 2 diabetes. He has done well from a cardiac standpoint up until 2 years ago when he started having mild exertional chest pain. He underwent a cardiac catheterization in 2011 which showed patent grafts. He continued to have mild exertional chest pain. He underwent a treadmill stress test in June of 2013 which showed no evidence of ischemia. He was able to exercise for 9-1/2 minutes with mild chest discomfort but no ischemic ECG changes. He underwent a treadmill nuclear stress test last month which showed normal ejection fraction. Fixed inferior wall defect with normal wall motion and moderate reversibility in the septal wall.  The patient exercises regularly at the heart track for about 45 minutes. He denies dyspnea, orthopnea or PND.  During last visit, I started him on Ranexa 500 mg twice daily. His chest pain improved significantly with this.  No Known Allergies   Current Outpatient Prescriptions on File Prior to Visit  Medication Sig Dispense Refill  . aspirin 81 MG tablet Take by mouth. Takes 2 tablets daily.      Marland Kitchen atenolol (TENORMIN) 50 MG tablet Take 50 mg by mouth daily.        . fish oil-omega-3 fatty acids 1000 MG capsule Take 2 g by mouth daily.        . isosorbide mononitrate (IMDUR) 30 MG 24 hr tablet Take 30 mg by mouth daily. Pt takes 90 mg total. 1 30mg  and 1 60 mg tablet daily.      . isosorbide mononitrate (IMDUR) 60 MG 24 hr tablet Take 60 mg by mouth daily. Pt takes 90 mg daily. 1 30 mg tablet and 1 60 mg tablet daily.      . metFORMIN (GLUCOPHAGE) 500 MG tablet Take 1 tablet (500 mg total) by mouth 2 (two) times daily with a meal.  180 tablet  3  . Multiple Vitamin (MULTIVITAMIN) tablet Take  1 tablet by mouth daily.        . niacin (NIASPAN) 500 MG CR tablet Takes 3 tablets daily.      . nitroGLYCERIN (NITROSTAT) 0.4 MG SL tablet Place 0.4 mg under the tongue every 5 (five) minutes as needed.      Marland Kitchen omeprazole (PRILOSEC) 20 MG capsule Take 20 mg by mouth daily.      . simvastatin (ZOCOR) 20 MG tablet Take 1 tablet (20 mg total) by mouth at bedtime.  90 tablet  3   No current facility-administered medications on file prior to visit.     Past Medical History  Diagnosis Date  . 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 Hypertriglyceridemia   . Diabetes mellitus without complication   . History of cardiac catheterization 2011    [facility]  . 3-vessel coronary artery disease     s/p  5 vessel CABG  . Hypertension      Past Surgical History  Procedure Laterality Date  . Coronary artery bypass graft  03/1998    5 vessel, [facility]  . Cardiac catheterization  05-19-2010    [facility]: Patent grafts. LIMA to LAD, SVG to D1, OM1 and RPDA     Family History  Problem Relation Age of Onset  . Heart attack Mother 52  .  Diabetes Mother   . Hypertension Mother   . Heart attack Father 64  . Heart disease Father      History   Social History  . Marital Status: Married    Spouse Name: N/A    Number of Children: N/A  . Years of Education: N/A   Occupational History  . Not on file.   Social History Main Topics  . Smoking status: Never Smoker   . Smokeless tobacco: Never Used  . Alcohol Use: No  . Drug Use: No  . Sexually Active: Not on file   Other Topics Concern  . Not on file   Social History Narrative  . No narrative on file        PHYSICAL EXAM   BP 142/68  Pulse 56  Ht 5\' 7"  (1.702 m)  Wt 194 lb 4 oz (88.111 kg)  BMI 30.42 kg/m2 Constitutional: He is oriented to person, place, and time. He appears well-developed and well-nourished. No distress.  HENT: No nasal discharge.  Head: Normocephalic and atraumatic.  Eyes: Pupils are equal and round. Right eye exhibits no  discharge. Left eye exhibits no discharge.  Neck: Normal range of motion. Neck supple. No JVD present. No thyromegaly present.  Cardiovascular: Normal rate, regular rhythm, normal heart sounds and. Exam reveals no gallop and no friction rub. No murmur heard.  Pulmonary/Chest: Effort normal and breath sounds normal. No stridor. No respiratory distress. He has no wheezes. He has no rales. He exhibits no tenderness.  Abdominal: Soft. Bowel sounds are normal. He exhibits no distension. There is no tenderness. There is no rebound and no guarding.  Musculoskeletal: Normal range of motion. He exhibits no edema and no tenderness.  Neurological: He is alert and oriented to person, place, and time. Coordination normal.  Skin: Skin is warm and dry. No rash noted. He is not diaphoretic. No erythema. No pallor.  Psychiatric: He has a normal mood and affect. His behavior is normal. Judgment and thought content normal.       EKG: Sinus  Bradycardia  -Left atrial enlargement.   -Old anteroseptal infarct.   ABNORMAL     ASSESSMENT AND PLAN

## 2012-08-11 ENCOUNTER — Other Ambulatory Visit: Payer: Self-pay | Admitting: Internal Medicine

## 2012-08-12 NOTE — Telephone Encounter (Signed)
Med filled.  

## 2012-08-20 DIAGNOSIS — M999 Biomechanical lesion, unspecified: Secondary | ICD-10-CM | POA: Diagnosis not present

## 2012-08-20 DIAGNOSIS — IMO0002 Reserved for concepts with insufficient information to code with codable children: Secondary | ICD-10-CM | POA: Diagnosis not present

## 2012-08-20 DIAGNOSIS — M5380 Other specified dorsopathies, site unspecified: Secondary | ICD-10-CM | POA: Diagnosis not present

## 2012-09-21 ENCOUNTER — Ambulatory Visit (INDEPENDENT_AMBULATORY_CARE_PROVIDER_SITE_OTHER): Payer: Medicare Other | Admitting: Internal Medicine

## 2012-09-21 ENCOUNTER — Encounter: Payer: Self-pay | Admitting: Internal Medicine

## 2012-09-21 VITALS — BP 116/60 | HR 49 | Temp 97.8°F | Resp 14 | Wt 188.5 lb

## 2012-09-21 DIAGNOSIS — I10 Essential (primary) hypertension: Secondary | ICD-10-CM | POA: Diagnosis not present

## 2012-09-21 DIAGNOSIS — R5383 Other fatigue: Secondary | ICD-10-CM | POA: Insufficient documentation

## 2012-09-21 DIAGNOSIS — IMO0002 Reserved for concepts with insufficient information to code with codable children: Secondary | ICD-10-CM | POA: Diagnosis not present

## 2012-09-21 DIAGNOSIS — I2510 Atherosclerotic heart disease of native coronary artery without angina pectoris: Secondary | ICD-10-CM

## 2012-09-21 DIAGNOSIS — R5381 Other malaise: Secondary | ICD-10-CM

## 2012-09-21 DIAGNOSIS — E1059 Type 1 diabetes mellitus with other circulatory complications: Secondary | ICD-10-CM

## 2012-09-21 DIAGNOSIS — E781 Pure hyperglyceridemia: Secondary | ICD-10-CM

## 2012-09-21 DIAGNOSIS — E119 Type 2 diabetes mellitus without complications: Secondary | ICD-10-CM

## 2012-09-21 DIAGNOSIS — M999 Biomechanical lesion, unspecified: Secondary | ICD-10-CM | POA: Diagnosis not present

## 2012-09-21 DIAGNOSIS — M9981 Other biomechanical lesions of cervical region: Secondary | ICD-10-CM | POA: Diagnosis not present

## 2012-09-21 DIAGNOSIS — E875 Hyperkalemia: Secondary | ICD-10-CM

## 2012-09-21 DIAGNOSIS — M5030 Other cervical disc degeneration, unspecified cervical region: Secondary | ICD-10-CM | POA: Diagnosis not present

## 2012-09-21 DIAGNOSIS — M5137 Other intervertebral disc degeneration, lumbosacral region: Secondary | ICD-10-CM | POA: Diagnosis not present

## 2012-09-21 LAB — LIPID PANEL
Cholesterol: 117 mg/dL (ref 0–200)
LDL Cholesterol: 59 mg/dL (ref 0–99)
Total CHOL/HDL Ratio: 3
Triglycerides: 117 mg/dL (ref 0.0–149.0)
VLDL: 23.4 mg/dL (ref 0.0–40.0)

## 2012-09-21 LAB — CBC WITH DIFFERENTIAL/PLATELET
Basophils Relative: 0.5 % (ref 0.0–3.0)
Eosinophils Relative: 1.2 % (ref 0.0–5.0)
HCT: 38.6 % — ABNORMAL LOW (ref 39.0–52.0)
Lymphs Abs: 2 10*3/uL (ref 0.7–4.0)
MCV: 94.2 fl (ref 78.0–100.0)
Monocytes Absolute: 0.3 10*3/uL (ref 0.1–1.0)
Monocytes Relative: 5.8 % (ref 3.0–12.0)
Neutrophils Relative %: 57.4 % (ref 43.0–77.0)
RBC: 4.1 Mil/uL — ABNORMAL LOW (ref 4.22–5.81)
WBC: 5.6 10*3/uL (ref 4.5–10.5)

## 2012-09-21 LAB — COMPREHENSIVE METABOLIC PANEL
ALT: 24 U/L (ref 0–53)
AST: 17 U/L (ref 0–37)
Albumin: 4.5 g/dL (ref 3.5–5.2)
Alkaline Phosphatase: 51 U/L (ref 39–117)
Calcium: 9.6 mg/dL (ref 8.4–10.5)
Chloride: 104 mEq/L (ref 96–112)
Potassium: 5.5 mEq/L — ABNORMAL HIGH (ref 3.5–5.1)
Sodium: 138 mEq/L (ref 135–145)

## 2012-09-21 NOTE — Assessment & Plan Note (Signed)
Now managed by Dr. Kirke Corin with addition of Ranexa  For management or reversible ischemia

## 2012-09-21 NOTE — Assessment & Plan Note (Signed)
I suspect his fatigue is secondnary to use of atenolol and resulting bradycardia. Will reduce atenolol dose, check thyroid and testosterone levels and rule out anemia.

## 2012-09-21 NOTE — Assessment & Plan Note (Signed)
Well-controlled on current medications.  hemoglobin A1c has been consistently less than 7.0 . He is up-to-date on eye exams and his foot exam is normal. l we'll repeat his urine microalbumin to creatinine ratio at next visit. He is on the appropriate medications.

## 2012-09-21 NOTE — Patient Instructions (Addendum)
Reduce your atenolol to 25 mg twice daily by breaking tablet in half.  If not possible, we will prescribe an alternative beta blocker that is available in a lower dose.   Will check thyroid, testosterone and cbc with your labs today to rule out other reversible causes of fatigue

## 2012-09-21 NOTE — Assessment & Plan Note (Signed)
Well controlled on current regimen. Renal function stable, no changes today. 

## 2012-09-21 NOTE — Progress Notes (Signed)
Patient ID: Johnathan Arnold, male   DOB: 12-06-1945, 67 y.o.   MRN: [id number]  Patient Active Problem List  Diagnosis  . Diabetes mellitus type 2, controlled  . 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 S/P CABG x 5  . Hypertriglyceridemia  . 3-vessel coronary artery disease  . Prostatitis, acute  . Hypertension  . Hyperlipidemia  . Other malaise and fatigue    Subjective:  CC:   Chief Complaint  Patient presents with  . Follow-up    3 month    HPI:   Johnathan Chohan Hudsonis a 67 y.o. male who presents for 3 month follow up on diabetes mellitus, hypertension, and CAD.   He was referred to Dr Kirke Corin for a second opinion on his recurrent episodes of chest discomfort and an abnormal  myoview  Ordered by Paraschos.    He was started on Ranexa by Dr. Kirke Corin and has noticed an improvement in chest pressure, but has resisted titration of dose to 1000 mg daily.    His cc today is persistent fatigue .  Denies insomnia, daytime somnolence, muscle weakness and impotence. He exercises 5 days per week using a treadmill  Keeps a regular pace.    Past Medical History  Diagnosis Date  . 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 Diabetes mellitus without complication   . History of cardiac catheterization 2011    [facility]  . 3-vessel coronary artery disease     s/p  5 vessel CABG  . Hypertension   . Hypertriglyceridemia   . Hyperlipidemia     Past Surgical History  Procedure Laterality Date  . Coronary artery bypass graft  03/1998    5 vessel, [facility]  . Cardiac catheterization  05-19-2010    [facility]: Patent grafts. LIMA to LAD, SVG to D1, OM1 and RPDA       The following portions of the patient's history were reviewed and updated as appropriate: Allergies, current medications, and problem list.    Review of Systems:   Patient denies headache, fevers, malaise, unintentional weight loss, skin rash, eye pain, sinus congestion and sinus pain, sore throat, dysphagia,  hemoptysis ,  cough, dyspnea, wheezing, chest pain, palpitations, orthopnea, edema, abdominal pain, nausea, melena, diarrhea, constipation, flank pain, dysuria, hematuria, urinary  Frequency, nocturia, numbness, tingling, seizures,  Focal weakness, Loss of consciousness,  Tremor, insomnia, depression, anxiety, and suicidal ideation.     History   Social History  . Marital Status: Married    Spouse Name: N/A    Number of Children: N/A  . Years of Education: N/A   Occupational History  . Not on file.   Social History Main Topics  . Smoking status: Never Smoker   . Smokeless tobacco: Never Used  . Alcohol Use: No  . Drug Use: No  . Sexually Active: Not on file   Other Topics Concern  . Not on file   Social History Narrative  . No narrative on file    Objective:  BP 116/60  Pulse 49  Temp(Src) 97.8 F (36.6 C) (Oral)  Resp 14  Wt 188 lb 8 oz (85.503 kg)  BMI 29.52 kg/m2  SpO2 98%  General appearance: alert, cooperative and appears stated age Ears: normal TM's and external ear canals both ears Throat: lips, mucosa, and tongue normal; teeth and gums normal Neck: no adenopathy, no carotid bruit, supple, symmetrical, trachea midline and thyroid not enlarged, symmetric, no tenderness/mass/nodules Back: symmetric, no curvature. ROM normal. No CVA tenderness.  Lungs: clear to auscultation bilaterally Heart: regular rate and rhythm, S1, S2 normal, no murmur, click, rub or gallop Abdomen: soft, non-tender; bowel sounds normal; no masses,  no organomegaly Pulses: 2+ and symmetric Skin: Skin color, texture, turgor normal. No rashes or lesions Lymph nodes: Cervical, supraclavicular, and axillary nodes normal.  Assessment and Plan:  3-vessel coronary artery disease Now managed by Dr. Kirke Corin with addition of Ranexa  For management or reversible ischemia  Other malaise and fatigue I suspect his fatigue is secondnary to use of atenolol and resulting bradycardia. Will reduce atenolol dose, check  thyroid and testosterone levels and rule out anemia.   Diabetes mellitus type 2, controlled Well-controlled on current medications.  hemoglobin A1c has been consistently less than 7.0 . He is up-to-date on eye exams and his foot exam is normal. l we'll repeat his urine microalbumin to creatinine ratio at next visit. He is on the appropriate medications.  Hypertension .Well controlled on current regimen. Renal function stable, no changes today.  Hypertriglyceridemia Well controlled on current regimen. liver function stable, no changes today.   Updated Medication List Outpatient Encounter Prescriptions as of 09/21/2012  Medication Sig Dispense Refill  . aspirin 81 MG tablet Take by mouth. Takes 2 tablets daily.      Marland Kitchen atenolol (TENORMIN) 50 MG tablet Take 50 mg by mouth daily.        . fish oil-omega-3 fatty acids 1000 MG capsule Take 2 g by mouth daily.        . isosorbide mononitrate (IMDUR) 30 MG 24 hr tablet TAKE 1 TABLET BY MOUTH EVERY DAY  90 tablet  0  . isosorbide mononitrate (IMDUR) 60 MG 24 hr tablet Take 60 mg by mouth daily. Pt takes 90 mg daily. 1 30 mg tablet and 1 60 mg tablet daily.      Marland Kitchen lisinopril (PRINIVIL,ZESTRIL) 40 MG tablet Take 1 tablet (40 mg total) by mouth daily.  90 tablet  3  . metFORMIN (GLUCOPHAGE) 500 MG tablet Take 1 tablet (500 mg total) by mouth 2 (two) times daily with a meal.  180 tablet  3  . Multiple Vitamin (MULTIVITAMIN) tablet Take 1 tablet by mouth daily.        . niacin (NIASPAN) 500 MG CR tablet Takes 3 tablets daily.      . nitroGLYCERIN (NITROSTAT) 0.4 MG SL tablet Place 0.4 mg under the tongue every 5 (five) minutes as needed.      Marland Kitchen omeprazole (PRILOSEC) 20 MG capsule TAKE 1 CAPSULE BY MOUTH EVERY MORNING AS DIRECTED  90 capsule  0  . ranolazine (RANEXA) 500 MG 12 hr tablet Take 1 tablet (500 mg total) by mouth 2 (two) times daily.  60 tablet  11  . simvastatin (ZOCOR) 20 MG tablet Take 1 tablet (20 mg total) by mouth at bedtime.  90 tablet  3   . zolpidem (AMBIEN) 10 MG tablet Take 10 mg by mouth at bedtime as needed for sleep.       No facility-administered encounter medications on file as of 09/21/2012.     Orders Placed This Encounter  Procedures  . CBC with Differential  . Comprehensive metabolic panel  . Lipid panel  . Hemoglobin A1c  . TSH  . Testosterone, free, total  . HM DIABETES EYE EXAM    Return in about 3 months (around 12/21/2012).

## 2012-09-21 NOTE — Assessment & Plan Note (Signed)
Well controlled on current regimen. liver function stable, no changes today.

## 2012-09-22 ENCOUNTER — Encounter: Payer: Self-pay | Admitting: Internal Medicine

## 2012-09-22 LAB — TESTOSTERONE, FREE, TOTAL, SHBG: Testosterone-% Free: 1.7 % (ref 1.6–2.9)

## 2012-09-22 NOTE — Addendum Note (Signed)
Addended by: [name] on: 09/22/2012 01:18 PM   Modules accepted: Orders

## 2012-10-14 DIAGNOSIS — H9319 Tinnitus, unspecified ear: Secondary | ICD-10-CM | POA: Diagnosis not present

## 2012-10-14 DIAGNOSIS — H905 Unspecified sensorineural hearing loss: Secondary | ICD-10-CM | POA: Diagnosis not present

## 2012-10-14 DIAGNOSIS — H903 Sensorineural hearing loss, bilateral: Secondary | ICD-10-CM | POA: Diagnosis not present

## 2012-10-18 DIAGNOSIS — IMO0002 Reserved for concepts with insufficient information to code with codable children: Secondary | ICD-10-CM | POA: Diagnosis not present

## 2012-10-18 DIAGNOSIS — M5030 Other cervical disc degeneration, unspecified cervical region: Secondary | ICD-10-CM | POA: Diagnosis not present

## 2012-10-18 DIAGNOSIS — M999 Biomechanical lesion, unspecified: Secondary | ICD-10-CM | POA: Diagnosis not present

## 2012-10-18 DIAGNOSIS — M5137 Other intervertebral disc degeneration, lumbosacral region: Secondary | ICD-10-CM | POA: Diagnosis not present

## 2012-10-18 DIAGNOSIS — M9981 Other biomechanical lesions of cervical region: Secondary | ICD-10-CM | POA: Diagnosis not present

## 2012-11-12 ENCOUNTER — Other Ambulatory Visit: Payer: Self-pay | Admitting: Internal Medicine

## 2012-11-15 DIAGNOSIS — IMO0002 Reserved for concepts with insufficient information to code with codable children: Secondary | ICD-10-CM | POA: Diagnosis not present

## 2012-11-15 DIAGNOSIS — M5137 Other intervertebral disc degeneration, lumbosacral region: Secondary | ICD-10-CM | POA: Diagnosis not present

## 2012-11-15 DIAGNOSIS — M999 Biomechanical lesion, unspecified: Secondary | ICD-10-CM | POA: Diagnosis not present

## 2012-11-15 DIAGNOSIS — M9981 Other biomechanical lesions of cervical region: Secondary | ICD-10-CM | POA: Diagnosis not present

## 2012-11-15 DIAGNOSIS — M5030 Other cervical disc degeneration, unspecified cervical region: Secondary | ICD-10-CM | POA: Diagnosis not present

## 2012-11-16 ENCOUNTER — Other Ambulatory Visit: Payer: Self-pay | Admitting: *Deleted

## 2012-11-16 MED ORDER — ISOSORBIDE MONONITRATE ER 30 MG PO TB24: ORAL_TABLET | ORAL | Status: DC

## 2012-12-01 DIAGNOSIS — Z0189 Encounter for other specified special examinations: Secondary | ICD-10-CM | POA: Diagnosis not present

## 2012-12-01 DIAGNOSIS — L820 Inflamed seborrheic keratosis: Secondary | ICD-10-CM | POA: Diagnosis not present

## 2012-12-01 DIAGNOSIS — D485 Neoplasm of uncertain behavior of skin: Secondary | ICD-10-CM | POA: Diagnosis not present

## 2012-12-01 DIAGNOSIS — L821 Other seborrheic keratosis: Secondary | ICD-10-CM | POA: Diagnosis not present

## 2012-12-09 ENCOUNTER — Other Ambulatory Visit: Payer: Self-pay | Admitting: Internal Medicine

## 2012-12-22 DIAGNOSIS — M999 Biomechanical lesion, unspecified: Secondary | ICD-10-CM | POA: Diagnosis not present

## 2012-12-22 DIAGNOSIS — M5430 Sciatica, unspecified side: Secondary | ICD-10-CM | POA: Diagnosis not present

## 2012-12-22 DIAGNOSIS — M5137 Other intervertebral disc degeneration, lumbosacral region: Secondary | ICD-10-CM | POA: Diagnosis not present

## 2012-12-23 ENCOUNTER — Ambulatory Visit (INDEPENDENT_AMBULATORY_CARE_PROVIDER_SITE_OTHER): Payer: Medicare Other | Admitting: Internal Medicine

## 2012-12-23 ENCOUNTER — Encounter: Payer: Self-pay | Admitting: Internal Medicine

## 2012-12-23 VITALS — BP 138/68 | HR 52 | Temp 98.7°F | Resp 14 | Wt 192.5 lb

## 2012-12-23 DIAGNOSIS — E785 Hyperlipidemia, unspecified: Secondary | ICD-10-CM

## 2012-12-23 DIAGNOSIS — I10 Essential (primary) hypertension: Secondary | ICD-10-CM

## 2012-12-23 DIAGNOSIS — E119 Type 2 diabetes mellitus without complications: Secondary | ICD-10-CM | POA: Diagnosis not present

## 2012-12-23 DIAGNOSIS — E538 Deficiency of other specified B group vitamins: Secondary | ICD-10-CM

## 2012-12-23 DIAGNOSIS — E781 Pure hyperglyceridemia: Secondary | ICD-10-CM | POA: Diagnosis not present

## 2012-12-23 DIAGNOSIS — I2510 Atherosclerotic heart disease of native coronary artery without angina pectoris: Secondary | ICD-10-CM

## 2012-12-23 LAB — MICROALBUMIN / CREATININE URINE RATIO
Creatinine,U: 98.6 mg/dL
Microalb Creat Ratio: 5.5 mg/g (ref 0.0–30.0)
Microalb, Ur: 5.4 mg/dL — ABNORMAL HIGH (ref 0.0–1.9)

## 2012-12-23 LAB — VITAMIN B12: Vitamin B-12: 502 pg/mL (ref 211–911)

## 2012-12-23 LAB — HEMOGLOBIN A1C: Hgb A1c MFr Bld: 6.3 % (ref 4.6–6.5)

## 2012-12-23 MED ORDER — ZOLPIDEM TARTRATE 10 MG PO TABS: 10.0000 mg | ORAL_TABLET | Freq: Every evening | ORAL | Status: DC | PRN

## 2012-12-23 NOTE — Patient Instructions (Addendum)
We will contact you by phone if there are any abnormal labs needing immediate attention; otherwise we will use [facility]  Return in 3 months

## 2012-12-23 NOTE — Progress Notes (Signed)
Patient ID: Johnathan Arnold, male   DOB: May 27, 1946, 67 y.o.   MRN: [id number]  Patient Active Problem List   Diagnosis Date Noted  . Other malaise and fatigue 09/21/2012  . Hyperlipidemia   . Hypertension   . 3-vessel coronary artery disease   . S/P CABG x 5   . Hypertriglyceridemia   . Diabetes mellitus type 2, controlled 05/21/2011  . 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 Follow-up    3 month DM    HPI:   Johnathan Arnold a 67 y.o. male who presents for 3 month follow up on DM , CAD, and hyperlipidemia.  He feels generally well. He did not return for repeat potassium level after April labs (K was 5.5) He takes lisinopril, and his dose was increased in February.   He has changed cardiologists (Paraschos to Blandon) due to persistent persistent exertional chest pain despite normal nuc med stress test in January.  He has been taking Ranexa which has helped but not completely resolved his recurrent mild exertional pain.   He switched from brand name Niaspan to generic when he learned of the cost to his insurance. He notes that the price of this one medication was responsible for putting him in the "doughnut hole" for the year. He has noticed no difference since the switch.   Regarding his diabetes management, he is taking his medications as directed. He has no side effects except occasional mild hypoglycemic events which occur when he delays lunch. year .     Past Medical History  Diagnosis Date  . 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 Diabetes mellitus without complication   . History of cardiac catheterization 2011    [facility]  . 3-vessel coronary artery disease     s/p  5 vessel CABG  . Hypertension   . Hypertriglyceridemia   . Hyperlipidemia     Past Surgical History  Procedure Laterality Date  . Coronary artery bypass graft  03/1998    5 vessel, [facility]  . Cardiac catheterization   05-19-2010    [facility]: Patent grafts. LIMA to LAD, SVG to D1, OM1 and RPDA       The following portions of the patient's history were reviewed and updated as appropriate: Allergies, current medications, and problem list.    Review of Systems:   Patient denies headache, fevers, malaise, unintentional weight loss, skin rash, eye pain, sinus congestion and sinus pain, sore throat, dysphagia,  hemoptysis , cough, dyspnea, wheezing, palpitations, orthopnea, edema, abdominal pain, nausea, melena, diarrhea, constipation, flank pain, dysuria, hematuria, urinary  Frequency, nocturia, numbness, tingling, seizures,  Focal weakness, Loss of consciousness,  Tremor, insomnia, depression, anxiety, and suicidal ideation.     History   Social History  . Marital Status: Married    Spouse Name: N/A    Number of Children: N/A  . Years of Education: N/A   Occupational History  . Not on file.   Social History Main Topics  . Smoking status: Never Smoker   . Smokeless tobacco: Never Used  . Alcohol Use: No  . Drug Use: No  . Sexually Active: Not on file   Other Topics Concern  . Not on file   Social History Narrative  . No narrative on file    Objective:  BP 138/68  Pulse 52  Temp(Src) 98.7 F (37.1 C) (Oral)  Resp 14  Wt  192 lb 8 oz (87.317 kg)  BMI 30.14 kg/m2  SpO2 98%  General appearance: alert, cooperative and appears stated age Ears: normal TM's and external ear canals both ears Throat: lips, mucosa, and tongue normal; teeth and gums normal Neck: no adenopathy, no carotid bruit, supple, symmetrical, trachea midline and thyroid not enlarged, symmetric, no tenderness/mass/nodules Back: symmetric, no curvature. ROM normal. No CVA tenderness. Lungs: clear to auscultation bilaterally Heart: regular rate and rhythm, S1, S2 normal, no murmur, click, rub or gallop Abdomen: soft, non-tender; bowel sounds normal; no masses,  no organomegaly Pulses: 2+ and symmetric Skin: Skin color,  texture, turgor normal. No rashes or lesions Lymph nodes: Cervical, supraclavicular, and axillary nodes normal. Foot exam:  Nails are well trimmed,  No callouses,  Sensation intact to microfilament. DP pulses are 2+  Assessment and Plan:  Diabetes mellitus type 2, controlled Well-controlled on current medications.  hemoglobin A1c has been consistently less than 7.0 . He is up-to-date on eye exams and his foot exam is normal. we'll repeat his urine microalbumin to creatinine ratio at next visit. He is on the appropriate medications.  Hypertriglyceridemia  his triglycerides and HDL haveactually both improved since switching from Niaspan to generic. No changes today.  Hyperlipidemia LDL is 49 on low-dose simvastatin.  No changes today.  Hypertension Well controlled on current regimen. Renal function stable, no changes today.  3-vessel coronary artery disease With prior 5 vessel  CABG remotely and 3 vessel disease by 2011 cath [facility]). He had a normal muc med strest test in Jan 2013 foe evaluation of recurrent exertional angina and has been taking Ranexa for management of recurrent mild exertional chest pain , prescrirbed by Dr Kirke Corin.    Updated Medication List Outpatient Encounter Prescriptions as of 12/23/2012  Medication Sig Dispense Refill  . aspirin 81 MG tablet Take by mouth. Takes 2 tablets daily.      Marland Kitchen atenolol (TENORMIN) 50 MG tablet Take 50 mg by mouth daily.        . fish oil-omega-3 fatty acids 1000 MG capsule Take 2 g by mouth daily.        . isosorbide mononitrate (IMDUR) 30 MG 24 hr tablet TAKE 1 TABLET BY MOUTH EVERY DAY  90 tablet  1  . isosorbide mononitrate (IMDUR) 60 MG 24 hr tablet Take 60 mg by mouth daily. Pt takes 90 mg daily. 1 30 mg tablet and 1 60 mg tablet daily.      Marland Kitchen lisinopril (PRINIVIL,ZESTRIL) 40 MG tablet Take 1 tablet (40 mg total) by mouth daily.  90 tablet  3  . metFORMIN (GLUCOPHAGE) 500 MG tablet Take 1 tablet (500 mg total) by mouth 2 (two) times  daily with a meal.  180 tablet  3  . Multiple Vitamin (MULTIVITAMIN) tablet Take 1 tablet by mouth daily.        . niacin (NIASPAN) 500 MG CR tablet Takes 3 tablets daily.      . nitroGLYCERIN (NITROSTAT) 0.4 MG SL tablet Place 0.4 mg under the tongue every 5 (five) minutes as needed.      Marland Kitchen omeprazole (PRILOSEC) 20 MG capsule TAKE 1 CAPSULE BY MOUTH EVERY MORNING AS DIRECTED  90 capsule  1  . ranolazine (RANEXA) 500 MG 12 hr tablet Take 1 tablet (500 mg total) by mouth 2 (two) times daily.  60 tablet  11  . simvastatin (ZOCOR) 20 MG tablet Take 1 tablet (20 mg total) by mouth at bedtime.  90 tablet  3  .  zolpidem (AMBIEN) 10 MG tablet Take 1 tablet (10 mg total) by mouth at bedtime as needed for sleep.  30 tablet  3  . [DISCONTINUED] zolpidem (AMBIEN) 10 MG tablet Take 10 mg by mouth at bedtime as needed for sleep.       No facility-administered encounter medications on file as of 12/23/2012.     Orders Placed This Encounter  Procedures  . Lipid panel  . Microalbumin / creatinine urine ratio  . Hemoglobin A1c  . Vitamin B12  . HM DIABETES FOOT EXAM    No Follow-up on file.

## 2012-12-24 ENCOUNTER — Encounter: Payer: Self-pay | Admitting: Internal Medicine

## 2012-12-25 ENCOUNTER — Encounter: Payer: Self-pay | Admitting: Internal Medicine

## 2012-12-25 NOTE — Assessment & Plan Note (Signed)
With prior 5 vessel  CABG remotely and 3 vessel disease by 2011 cath [facility]). He had a normal muc med strest test in Jan 2013 foe evaluation of recurrent exertional angina and has been taking Ranexa for management of recurrent mild exertional chest pain , prescrirbed by Dr Kirke Corin.

## 2012-12-25 NOTE — Assessment & Plan Note (Signed)
his triglycerides and HDL haveactually both improved since switching from Niaspan to generic. No changes today.

## 2012-12-25 NOTE — Assessment & Plan Note (Signed)
Well-controlled on current medications.  hemoglobin A1c has been consistently less than 7.0 . He is up-to-date on eye exams and his foot exam is norma. l we'll repeat his urine microalbumin to creatinine ratio at next visit. He is on the appropriate medications. 

## 2012-12-25 NOTE — Assessment & Plan Note (Signed)
Well controlled on current regimen. Renal function stable, no changes today. 

## 2012-12-25 NOTE — Assessment & Plan Note (Signed)
LDL is 49 on low-dose simvastatin.  No changes today.

## 2013-01-24 DIAGNOSIS — M5137 Other intervertebral disc degeneration, lumbosacral region: Secondary | ICD-10-CM | POA: Diagnosis not present

## 2013-01-24 DIAGNOSIS — M999 Biomechanical lesion, unspecified: Secondary | ICD-10-CM | POA: Diagnosis not present

## 2013-01-24 DIAGNOSIS — M5430 Sciatica, unspecified side: Secondary | ICD-10-CM | POA: Diagnosis not present

## 2013-02-04 ENCOUNTER — Encounter: Payer: Self-pay | Admitting: Cardiovascular Disease

## 2013-02-04 ENCOUNTER — Ambulatory Visit (INDEPENDENT_AMBULATORY_CARE_PROVIDER_SITE_OTHER): Payer: Medicare Other | Admitting: Cardiovascular Disease

## 2013-02-04 VITALS — BP 122/78 | HR 51 | Ht 67.0 in | Wt 192.5 lb

## 2013-02-04 DIAGNOSIS — E785 Hyperlipidemia, unspecified: Secondary | ICD-10-CM | POA: Diagnosis not present

## 2013-02-04 DIAGNOSIS — I2510 Atherosclerotic heart disease of native coronary artery without angina pectoris: Secondary | ICD-10-CM | POA: Diagnosis not present

## 2013-02-04 DIAGNOSIS — I10 Essential (primary) hypertension: Secondary | ICD-10-CM

## 2013-02-04 DIAGNOSIS — R079 Chest pain, unspecified: Secondary | ICD-10-CM | POA: Diagnosis not present

## 2013-02-04 NOTE — Patient Instructions (Addendum)
Stop taking Niacin.  Continue other medications.  Follow up in 6 months.

## 2013-02-04 NOTE — Assessment & Plan Note (Signed)
Blood pressure is well controlled on current medications. 

## 2013-02-04 NOTE — Assessment & Plan Note (Signed)
Lab Results  Component Value Date   CHOL 131 12/23/2012   HDL 65.40 12/23/2012   LDLCALC 49 12/23/2012   TRIG 84.0 12/23/2012   CHOLHDL 2 12/23/2012   I advised him to stop taking niacin or Niaspan given the recent data showing no long-term benefit of this medication in patients already on a statin.

## 2013-02-04 NOTE — Progress Notes (Signed)
HPI  This is a pleasant 67 year old male who is here today for a followup visit.  He has known history of coronary artery disease status post CABG in 1999. He also has known history of hypertension, hyperlipidemia and type 2 diabetes. He has done well from a cardiac standpoint up until 2 years ago when he started having mild exertional chest pain. He underwent a cardiac catheterization in 2011 which showed patent grafts. He continued to have mild exertional chest pain. He underwent a treadmill stress test in June of 2013 which showed no evidence of ischemia. He was able to exercise for 9-1/2 minutes with mild chest discomfort but no ischemic ECG changes. He underwent a treadmill nuclear stress test last month which showed normal ejection fraction. Fixed inferior wall defect with normal wall motion and moderate reversibility in the septal wall.  He was started him on Ranexa 500 mg twice daily with improvement in angina. He is doing well overall. He is concerned about the cost of Ranexa as it is a tier 3 drug.    No Known Allergies   Current Outpatient Prescriptions on File Prior to Visit  Medication Sig Dispense Refill  . aspirin 81 MG tablet Take by mouth. Takes 2 tablets daily.      Marland Kitchen atenolol (TENORMIN) 50 MG tablet Take 50 mg by mouth daily.        . fish oil-omega-3 fatty acids 1000 MG capsule Take 2 g by mouth daily.        . isosorbide mononitrate (IMDUR) 30 MG 24 hr tablet TAKE 1 TABLET BY MOUTH EVERY DAY  90 tablet  1  . isosorbide mononitrate (IMDUR) 60 MG 24 hr tablet Take 60 mg by mouth daily. Pt takes 90 mg daily. 1 30 mg tablet and 1 60 mg tablet daily.      Marland Kitchen lisinopril (PRINIVIL,ZESTRIL) 40 MG tablet Take 1 tablet (40 mg total) by mouth daily.  90 tablet  3  . metFORMIN (GLUCOPHAGE) 500 MG tablet Take 1 tablet (500 mg total) by mouth 2 (two) times daily with a meal.  180 tablet  3  . Multiple Vitamin (MULTIVITAMIN) tablet Take 1 tablet by mouth daily.        . niacin (NIASPAN)  500 MG CR tablet Takes 2 tablets daily.      . nitroGLYCERIN (NITROSTAT) 0.4 MG SL tablet Place 0.4 mg under the tongue every 5 (five) minutes as needed.      . ranolazine (RANEXA) 500 MG 12 hr tablet Take 1 tablet (500 mg total) by mouth 2 (two) times daily.  60 tablet  11  . simvastatin (ZOCOR) 20 MG tablet Take 1 tablet (20 mg total) by mouth at bedtime.  90 tablet  3  . zolpidem (AMBIEN) 10 MG tablet Take 1 tablet (10 mg total) by mouth at bedtime as needed for sleep.  30 tablet  3   No current facility-administered medications on file prior to visit.     Past Medical History  Diagnosis Date  . 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 Diabetes mellitus without complication   . History of cardiac catheterization 2011    [facility]  . 3-vessel coronary artery disease     s/p  5 vessel CABG  . Hypertension   . Hypertriglyceridemia   . Hyperlipidemia      Past Surgical History  Procedure Laterality Date  . Coronary artery bypass graft  03/1998    5 vessel, [facility]  . Cardiac catheterization  05-19-2010    [facility]: Patent grafts. LIMA to LAD, SVG to D1, OM1 and RPDA     Family History  Problem Relation Age of Onset  . Heart attack Mother 32  . Diabetes Mother   . Hypertension Mother   . Heart attack Father 81  . Heart disease Father      History   Social History  . Marital Status: Married    Spouse Name: N/A    Number of Children: N/A  . Years of Education: N/A   Occupational History  . Not on file.   Social History Main Topics  . Smoking status: Never Smoker   . Smokeless tobacco: Never Used  . Alcohol Use: No  . Drug Use: No  . Sexual Activity: Not on file   Other Topics Concern  . Not on file   Social History Narrative  . No narrative on file        PHYSICAL EXAM   BP 122/78  Pulse 51  Ht 5\' 7"  (1.702 m)  Wt 192 lb 8 oz (87.317 kg)  BMI 30.14 kg/m2 Constitutional: He is oriented to person, place, and time. He appears well-developed and well-nourished. No  distress.  HENT: No nasal discharge.  Head: Normocephalic and atraumatic.  Eyes: Pupils are equal and round. Right eye exhibits no discharge. Left eye exhibits no discharge.  Neck: Normal range of motion. Neck supple. No JVD present. No thyromegaly present.  Cardiovascular: Normal rate, regular rhythm, normal heart sounds and. Exam reveals no gallop and no friction rub. No murmur heard.  Pulmonary/Chest: Effort normal and breath sounds normal. No stridor. No respiratory distress. He has no wheezes. He has no rales. He exhibits no tenderness.  Abdominal: Soft. Bowel sounds are normal. He exhibits no distension. There is no tenderness. There is no rebound and no guarding.  Musculoskeletal: Normal range of motion. He exhibits no edema and no tenderness.  Neurological: He is alert and oriented to person, place, and time. Coordination normal.  Skin: Skin is warm and dry. No rash noted. He is not diaphoretic. No erythema. No pallor.  Psychiatric: He has a normal mood and affect. His behavior is normal. Judgment and thought content normal.       EKG: Sinus  Bradycardia  -Left atrial enlargement.   -Nonspecific ST and T wave changes    ASSESSMENT AND PLAN

## 2013-02-04 NOTE — Assessment & Plan Note (Signed)
The patient has known history of coronary artery disease status post CABG in 1999. He had residual mild angina with septal ischemia noted a nuclear stress test. His symptoms improved significantly after the addition of Ranexa which will be continued at the current dose of 500 mg twice daily. I suggested increasing the dose to 1000 mg twice daily but he is concerned about the addition of cost. Continue other cardiac medications. He is mildly bradycardic but overall he is asymptomatic. Continue current dose of atenolol given its antianginal effect.

## 2013-02-21 ENCOUNTER — Other Ambulatory Visit: Payer: Self-pay | Admitting: *Deleted

## 2013-02-21 DIAGNOSIS — M999 Biomechanical lesion, unspecified: Secondary | ICD-10-CM | POA: Diagnosis not present

## 2013-02-21 DIAGNOSIS — M5137 Other intervertebral disc degeneration, lumbosacral region: Secondary | ICD-10-CM | POA: Diagnosis not present

## 2013-02-21 DIAGNOSIS — M5430 Sciatica, unspecified side: Secondary | ICD-10-CM | POA: Diagnosis not present

## 2013-02-22 ENCOUNTER — Other Ambulatory Visit: Payer: Self-pay | Admitting: *Deleted

## 2013-02-22 MED ORDER — SIMVASTATIN 20 MG PO TABS: 20.0000 mg | ORAL_TABLET | Freq: Every day | ORAL | Status: DC

## 2013-02-22 NOTE — Telephone Encounter (Signed)
Refilled Simvastatin sent to [facility].

## 2013-03-04 ENCOUNTER — Other Ambulatory Visit: Payer: Self-pay | Admitting: Cardiovascular Disease

## 2013-03-11 ENCOUNTER — Other Ambulatory Visit: Payer: Self-pay

## 2013-03-11 MED ORDER — ATENOLOL 50 MG PO TABS: ORAL_TABLET | ORAL | Status: DC

## 2013-03-11 NOTE — Telephone Encounter (Signed)
Refilled Atenolol sent to [facility] pharmacy.

## 2013-03-15 ENCOUNTER — Other Ambulatory Visit: Payer: Self-pay | Admitting: *Deleted

## 2013-03-15 MED ORDER — ATENOLOL 50 MG PO TABS: ORAL_TABLET | ORAL | Status: DC

## 2013-03-15 NOTE — Telephone Encounter (Signed)
Refilled Atenolol sent to [facility] pharmacy. 

## 2013-03-21 DIAGNOSIS — M5430 Sciatica, unspecified side: Secondary | ICD-10-CM | POA: Diagnosis not present

## 2013-03-21 DIAGNOSIS — M5137 Other intervertebral disc degeneration, lumbosacral region: Secondary | ICD-10-CM | POA: Diagnosis not present

## 2013-03-21 DIAGNOSIS — M999 Biomechanical lesion, unspecified: Secondary | ICD-10-CM | POA: Diagnosis not present

## 2013-03-29 ENCOUNTER — Ambulatory Visit: Payer: Medicare Other | Admitting: Internal Medicine

## 2013-03-29 ENCOUNTER — Other Ambulatory Visit: Payer: Self-pay | Admitting: *Deleted

## 2013-03-29 MED ORDER — ISOSORBIDE MONONITRATE ER 60 MG PO TB24: 60.0000 mg | ORAL_TABLET | Freq: Every day | ORAL | Status: DC

## 2013-03-29 NOTE — Telephone Encounter (Signed)
Patient would like a 90 day supply this time.

## 2013-03-29 NOTE — Telephone Encounter (Signed)
Requested Prescriptions   Signed Prescriptions Disp Refills  . isosorbide mononitrate (IMDUR) 60 MG 24 hr tablet 90 tablet 3    Sig: Take 1 tablet (60 mg total) by mouth daily. Pt takes 90 mg daily. 1 30 mg tablet and 1 60 mg tablet daily.    Authorizing Provider: Lorine Bears A    Ordering User: Kendrick Fries

## 2013-03-31 ENCOUNTER — Encounter: Payer: Self-pay | Admitting: *Deleted

## 2013-04-01 ENCOUNTER — Ambulatory Visit (INDEPENDENT_AMBULATORY_CARE_PROVIDER_SITE_OTHER): Payer: Medicare Other | Admitting: Internal Medicine

## 2013-04-01 ENCOUNTER — Encounter: Payer: Self-pay | Admitting: Internal Medicine

## 2013-04-01 VITALS — BP 128/68 | HR 44 | Temp 97.8°F | Resp 12 | Ht 67.0 in | Wt 194.2 lb

## 2013-04-01 DIAGNOSIS — E119 Type 2 diabetes mellitus without complications: Secondary | ICD-10-CM | POA: Diagnosis not present

## 2013-04-01 DIAGNOSIS — E669 Obesity, unspecified: Secondary | ICD-10-CM

## 2013-04-01 DIAGNOSIS — I209 Angina pectoris, unspecified: Secondary | ICD-10-CM

## 2013-04-01 DIAGNOSIS — Z79899 Other long term (current) drug therapy: Secondary | ICD-10-CM

## 2013-04-01 DIAGNOSIS — E785 Hyperlipidemia, unspecified: Secondary | ICD-10-CM | POA: Diagnosis not present

## 2013-04-01 DIAGNOSIS — Z23 Encounter for immunization: Secondary | ICD-10-CM | POA: Diagnosis not present

## 2013-04-01 DIAGNOSIS — E1169 Type 2 diabetes mellitus with other specified complication: Secondary | ICD-10-CM | POA: Diagnosis not present

## 2013-04-01 DIAGNOSIS — E1159 Type 2 diabetes mellitus with other circulatory complications: Secondary | ICD-10-CM

## 2013-04-01 DIAGNOSIS — I2510 Atherosclerotic heart disease of native coronary artery without angina pectoris: Secondary | ICD-10-CM

## 2013-04-01 LAB — COMPREHENSIVE METABOLIC PANEL
AST: 25 U/L (ref 0–37)
Alkaline Phosphatase: 44 U/L (ref 39–117)
BUN: 16 mg/dL (ref 6–23)
Creatinine, Ser: 1.4 mg/dL (ref 0.4–1.5)
Total Bilirubin: 0.7 mg/dL (ref 0.3–1.2)

## 2013-04-01 LAB — LIPID PANEL
Cholesterol: 142 mg/dL (ref 0–200)
HDL: 49.2 mg/dL (ref >39.00)
LDL Cholesterol: 65 mg/dL (ref 0–99)
Total CHOL/HDL Ratio: 3
Triglycerides: 139 mg/dL (ref 0.0–149.0)
VLDL: 27.8 mg/dL (ref 0.0–40.0)

## 2013-04-01 LAB — HEMOGLOBIN A1C: Hgb A1c MFr Bld: 6.2 % (ref 4.6–6.5)

## 2013-04-01 LAB — HM DIABETES FOOT EXAM: HM Diabetic Foot Exam: NORMAL

## 2013-04-01 MED ORDER — NITROGLYCERIN 0.4 MG SL SUBL: 0.4000 mg | SUBLINGUAL_TABLET | SUBLINGUAL | Status: DC | PRN

## 2013-04-01 NOTE — Patient Instructions (Addendum)
Your next visit will be your medicare wellness visit , due after January 15th

## 2013-04-01 NOTE — Progress Notes (Signed)
Patient ID: Johnathan Arnold, male   DOB: 22-Aug-1945, 67 y.o.   MRN: [id number]  Patient Active Problem List   Diagnosis Date Noted  . Other malaise and fatigue 09/21/2012  . Hyperlipidemia   . Hypertension   . 3-vessel coronary artery disease   . S/P CABG x 5   . Hypertriglyceridemia   . Diabetes mellitus type 2, controlled 05/21/2011  . 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 Follow-up    3 month    HPI:   Johnathan Noecker Hudsonis a 66 y.o. male who presents Follow up on DM Type 2, hyperlipidemia, and CAD.  He has felt generally well . Marland KitchenHe is exercising regularly and following a low glycemic index diet.  No trouble taking medications as directed or tolerating them.  Has noted a return, although very mild, of the chest pain he was experiencing during exercise several months ago, despite the addition of Ranexa by Dr. Kirke Corin. No diaphoresis, jaw pain , nausea or dyspnea.  It does not occur with rest or become severe enough to limit his exercise activity.  He has an appt with him soon.    Past Medical History  Diagnosis Date  . 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 Diabetes mellitus without complication   . History of cardiac catheterization 2011    [facility]  . 3-vessel coronary artery disease     s/p  5 vessel CABG  . Hypertension   . Hypertriglyceridemia   . Hyperlipidemia     Past Surgical History  Procedure Laterality Date  . Coronary artery bypass graft  03/1998    5 vessel, [facility]  . Cardiac catheterization  05-19-2010    [facility]: Patent grafts. LIMA to LAD, SVG to D1, OM1 and RPDA       The following portions of the patient's history were reviewed and updated as appropriate: Allergies, current medications, and problem list.    Review of Systems:   12 Pt  review of systems was negative except those addressed in the HPI,     History   Social History  . Marital Status: Married   Spouse Name: N/A    Number of Children: N/A  . Years of Education: N/A   Occupational History  . Not on file.   Social History Main Topics  . Smoking status: Never Smoker   . Smokeless tobacco: Never Used  . Alcohol Use: No  . Drug Use: No  . Sexual Activity: Not on file   Other Topics Concern  . Not on file   Social History Narrative  . No narrative on file    Objective:  Filed Vitals:   04/01/13 0935  BP: 128/68  Pulse: 44  Temp: 97.8 F (36.6 C)  Resp: 12     General appearance: alert, cooperative and appears stated age Ears: normal TM's and external ear canals both ears Throat: lips, mucosa, and tongue normal; teeth and gums normal Neck: no adenopathy, no carotid bruit, supple, symmetrical, trachea midline and thyroid not enlarged, symmetric, no tenderness/mass/nodules Back: symmetric, no curvature. ROM normal. No CVA tenderness. Lungs: clear to auscultation bilaterally Heart: regular rate and rhythm, S1, S2 normal, no murmur, click, rub or gallop Abdomen: soft, non-tender; bowel sounds normal; no masses,  no organomegaly Pulses: 2+ and symmetric Skin: Skin color, texture, turgor normal. No rashes or lesions Lymph nodes: Cervical, supraclavicular, and axillary nodes normal.  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 He had residual mild angina with septal ischemia noted a nuclear stress test. His symptoms improved significantly after the addition of Ranexa .  Dr. Kirke Corin has  suggested increasing the dose to 1000 mg twice daily but he is concerned about the additional cost         3-vessel coronary artery disease Continue statin, beta blocker, asa, ACE I ,  Nitrates and Ranexa.  DM well controlled. folllow up with Arida  For exertional angina.   Diabetes mellitus type 2, controlled Well-controlled on current medications.  hemoglobin A1c  has been consistently less than 7.0 . He is up-to-date on eye exams and his foot exam is normal. l we'll repeat his urine microalbumin to creatinine ratio at next visit. He is on the appropriate medications.  Lab Results  Component Value Date   HGBA1C 6.2 04/01/2013   Lab Results  Component Value Date   CREATININE 1.4 04/01/2013    Hyperlipidemia LDL and triglycerides are at goal on current medications. He has no side effects and liver enzymes are normal. No changes today.  Lab Results  Component Value Date   CHOL 142 04/01/2013   HDL 49.20 04/01/2013   LDLCALC 65 04/01/2013   TRIG 139.0 04/01/2013   CHOLHDL 3 04/01/2013     Obesity, unspecified I have addressed  BMI and recommended wt loss of 10% of body weigh over the next 6 months using a low glycemic index diet and regular exercise a minimum of 5 days per week.   Body mass index is 30.42 kg/(m^2).    Updated Medication List Outpatient Encounter Prescriptions as of 04/01/2013  Medication Sig Dispense Refill  . aspirin 81 MG tablet Take by mouth. Takes 2 tablets daily.      Marland Kitchen atenolol (TENORMIN) 50 MG tablet TAKE 1 TABLET BY MOUTH EVERY DAY  90 tablet  3  . fish oil-omega-3 fatty acids 1000 MG capsule Take 2 g by mouth daily.        . isosorbide mononitrate (IMDUR) 30 MG 24 hr tablet TAKE 1 TABLET BY MOUTH EVERY DAY  90 tablet  1  . isosorbide mononitrate (IMDUR) 60 MG 24 hr tablet Take 1 tablet (60 mg total) by mouth daily. Pt takes 90 mg daily. 1 30 mg tablet and 1 60 mg tablet daily.  90 tablet  3  . lisinopril (PRINIVIL,ZESTRIL) 40 MG tablet Take 1 tablet (40 mg total) by mouth daily.  90 tablet  3  . metFORMIN (GLUCOPHAGE) 500 MG tablet Take 1 tablet (500 mg total) by mouth 2 (two) times daily with a meal.  180 tablet  3  . Multiple Vitamin (MULTIVITAMIN) tablet Take 1 tablet by mouth daily.        . nitroGLYCERIN (NITROSTAT) 0.4 MG SL tablet Place 1 tablet (0.4 mg total) under the tongue every 5 (five) minutes as  needed.  30 tablet  3  . omeprazole (PRILOSEC) 20 MG capsule       . ranolazine (RANEXA) 500 MG 12 hr tablet Take 1 tablet (500 mg total) by mouth 2 (two) times daily.  60 tablet  11  . simvastatin (ZOCOR) 20 MG tablet Take 1 tablet (20 mg total) by mouth at bedtime.  90 tablet  3  . zolpidem (AMBIEN) 10 MG tablet Take 1 tablet (10 mg total) by mouth at bedtime as needed  for sleep.  30 tablet  3  . [DISCONTINUED] nitroGLYCERIN (NITROSTAT) 0.4 MG SL tablet Place 0.4 mg under the tongue every 5 (five) minutes as needed.       No facility-administered encounter medications on file as of 04/01/2013.

## 2013-04-03 ENCOUNTER — Encounter: Payer: Self-pay | Admitting: Internal Medicine

## 2013-04-03 DIAGNOSIS — E669 Obesity, unspecified: Secondary | ICD-10-CM | POA: Insufficient documentation

## 2013-04-03 NOTE — Assessment & Plan Note (Signed)
LDL and triglycerides are at goal on current medications. He has no side effects and liver enzymes are normal. No changes today.  Lab Results  Component Value Date   CHOL 142 04/01/2013   HDL 49.20 04/01/2013   LDLCALC 65 04/01/2013   TRIG 139.0 04/01/2013   CHOLHDL 3 04/01/2013

## 2013-04-03 NOTE — Assessment & Plan Note (Addendum)
Well-controlled on current medications.  hemoglobin A1c has been consistently less than 7.0 . He is up-to-date on eye exams and his foot exam is normal. l we'll repeat his urine microalbumin to creatinine ratio at next visit. He is on the appropriate medications.  Lab Results  Component Value Date   HGBA1C 6.2 04/01/2013   Lab Results  Component Value Date   CREATININE 1.4 04/01/2013

## 2013-04-03 NOTE — Assessment & Plan Note (Addendum)
The patient has known history of coronary artery disease status post CABG in 1999. He had residual mild angina with septal ischemia noted a nuclear stress test. His symptoms improved significantly after the addition of Ranexa .  Dr. Kirke Corin has  suggested increasing the dose to 1000 mg twice daily but he is concerned about the additional cost

## 2013-04-03 NOTE — Assessment & Plan Note (Signed)
I have addressed  BMI and recommended wt loss of 10% of body weigh over the next 6 months using a low glycemic index diet and regular exercise a minimum of 5 days per week.   Body mass index is 30.42 kg/(m^2).

## 2013-04-03 NOTE — Assessment & Plan Note (Addendum)
Continue statin, beta blocker, asa, ACE I ,  Nitrates and Ranexa.  DM well controlled. folllow up with Arida  For exertional angina.

## 2013-04-25 ENCOUNTER — Encounter: Payer: Self-pay | Admitting: Internal Medicine

## 2013-04-27 DIAGNOSIS — M5137 Other intervertebral disc degeneration, lumbosacral region: Secondary | ICD-10-CM | POA: Diagnosis not present

## 2013-04-27 DIAGNOSIS — M999 Biomechanical lesion, unspecified: Secondary | ICD-10-CM | POA: Diagnosis not present

## 2013-04-27 DIAGNOSIS — M5430 Sciatica, unspecified side: Secondary | ICD-10-CM | POA: Diagnosis not present

## 2013-05-09 ENCOUNTER — Other Ambulatory Visit: Payer: Self-pay | Admitting: Internal Medicine

## 2013-05-11 DIAGNOSIS — E119 Type 2 diabetes mellitus without complications: Secondary | ICD-10-CM | POA: Diagnosis not present

## 2013-05-27 ENCOUNTER — Other Ambulatory Visit: Payer: Self-pay | Admitting: *Deleted

## 2013-05-27 MED ORDER — METFORMIN HCL 500 MG PO TABS: 500.0000 mg | ORAL_TABLET | Freq: Two times a day (BID) | ORAL | Status: DC

## 2013-05-30 ENCOUNTER — Other Ambulatory Visit: Payer: Self-pay | Admitting: *Deleted

## 2013-05-30 LAB — HM DIABETES EYE EXAM: HM DIABETIC EYE EXAM: NORMAL

## 2013-05-30 MED ORDER — METFORMIN HCL 500 MG PO TABS: 500.0000 mg | ORAL_TABLET | Freq: Two times a day (BID) | ORAL | Status: DC

## 2013-06-23 LAB — HM DIABETES FOOT EXAM: HM Diabetic Foot Exam: NORMAL

## 2013-06-28 ENCOUNTER — Encounter: Payer: Medicare Other | Admitting: Internal Medicine

## 2013-06-30 ENCOUNTER — Encounter: Payer: Self-pay | Admitting: Internal Medicine

## 2013-06-30 ENCOUNTER — Ambulatory Visit (INDEPENDENT_AMBULATORY_CARE_PROVIDER_SITE_OTHER): Payer: Medicare Other | Admitting: Internal Medicine

## 2013-06-30 VITALS — BP 158/78 | HR 60 | Temp 98.3°F | Resp 16 | Ht 66.0 in | Wt 192.0 lb

## 2013-06-30 DIAGNOSIS — Z125 Encounter for screening for malignant neoplasm of prostate: Secondary | ICD-10-CM | POA: Diagnosis not present

## 2013-06-30 DIAGNOSIS — R194 Change in bowel habit: Secondary | ICD-10-CM

## 2013-06-30 DIAGNOSIS — E119 Type 2 diabetes mellitus without complications: Secondary | ICD-10-CM

## 2013-06-30 DIAGNOSIS — R198 Other specified symptoms and signs involving the digestive system and abdomen: Secondary | ICD-10-CM

## 2013-06-30 DIAGNOSIS — Z23 Encounter for immunization: Secondary | ICD-10-CM

## 2013-06-30 DIAGNOSIS — E785 Hyperlipidemia, unspecified: Secondary | ICD-10-CM

## 2013-06-30 DIAGNOSIS — Z0000 Encounter for general adult medical examination without abnormal findings: Secondary | ICD-10-CM

## 2013-06-30 DIAGNOSIS — E669 Obesity, unspecified: Secondary | ICD-10-CM

## 2013-06-30 DIAGNOSIS — I209 Angina pectoris, unspecified: Secondary | ICD-10-CM

## 2013-06-30 DIAGNOSIS — I10 Essential (primary) hypertension: Secondary | ICD-10-CM

## 2013-06-30 DIAGNOSIS — E1159 Type 2 diabetes mellitus with other circulatory complications: Secondary | ICD-10-CM

## 2013-06-30 LAB — HM DIABETES FOOT EXAM: HM Diabetic Foot Exam: NORMAL

## 2013-06-30 NOTE — Patient Instructions (Signed)
You had your annual Medicare wellness exam today  Please use the stool kit to send [place] back a sample to test for blood.  This is your interim colon CA screening test.   You received the pneumonia vaccine today.  Pleases return for fasting labs and PSA after jan 24th (to avoid nonpayment by Medicare )  We will contact you with your bloodwork results  1) Your blood pressures was elevated today (170/80 by my check).  Please check it five times over the next 2 weeks and send me the readings so I can decide if we need to change your lisinopril to losartan for better control.  2) Your constipation may be due to inadequate fiber or water as part of your daily diet   Goal with fiber intake is 25 to 35 grams daily.  Water:  3 16 ounce bottles  Try incorporating Atkins bars,  low carb flatbreads/tortillas as bread substitutes (Mission,  Flat Out are both available widely) and nuts into your diet Metamucil and Miralax,  benefiber and Citrucel are all fiber leaxataives that can be used daily or as needed  Managing Your High Blood Pressure Blood pressure is a measurement of how forceful your blood is pressing against the walls of the arteries. Arteries are muscular tubes within the circulatory system. Blood pressure does not stay the same. Blood pressure rises when you are active, excited, or nervous; and it lowers during sleep and relaxation. If the numbers measuring your blood pressure stay above normal most of the time, you are at risk for health problems. High blood pressure (hypertension) is a long-term (chronic) condition in which blood pressure is elevated. A blood pressure reading is recorded as two numbers, such as 120 over 80 (or 120/80). The first, higher number is called the systolic pressure. It is a measure of the pressure in your arteries as the heart beats. The second, lower number is called the diastolic pressure. It is a measure of the pressure in your arteries as the heart relaxes between  beats.  Keeping your blood pressure in a normal range is important to your overall health and prevention of health problems, such as heart disease and stroke. When your blood pressure is uncontrolled, your heart has to work harder than normal. High blood pressure is a very common condition in adults because blood pressure tends to rise with age. Men and women are equally likely to have hypertension but at different times in life. Before age 35, men are more likely to have hypertension. After 69 years of age, women are more likely to have it. Hypertension is especially common in African Americans. This condition often has no signs or symptoms. The cause of the condition is usually not known. Your caregiver can help you come up with a plan to keep your blood pressure in a normal, healthy range. BLOOD PRESSURE STAGES Blood pressure is classified into four stages: normal, prehypertension, stage 1, and stage 2. Your blood pressure reading will be used to determine what type of treatment, if any, is necessary. Appropriate treatment options are tied to these four stages:  Normal  Systolic pressure (mm Hg): below 120.  Diastolic pressure (mm Hg): below 80. Prehypertension  Systolic pressure (mm Hg): 120 to 139.  Diastolic pressure (mm Hg): 80 to 89. Stage1  Systolic pressure (mm Hg): 140 to 159.  Diastolic pressure (mm Hg): 90 to 99. Stage2  Systolic pressure (mm Hg): 160 or above.  Diastolic pressure (mm Hg): 100 or above. RISKS RELATED  TO HIGH BLOOD PRESSURE Managing your blood pressure is an important responsibility. Uncontrolled high blood pressure can lead to:  A heart attack.  A stroke.  A weakened blood vessel (aneurysm).  Heart failure.  Kidney damage.  Eye damage.  Metabolic syndrome.  Memory and concentration problems. HOW TO MANAGE YOUR BLOOD PRESSURE Blood pressure can be managed effectively with lifestyle changes and medicines (if needed). Your caregiver will help  you come up with a plan to bring your blood pressure within a normal range. Your plan should include the following: Education  Read all information provided by your caregivers about how to control blood pressure.  Educate yourself on the latest guidelines and treatment recommendations. New research is always being done to further define the risks and treatments for high blood pressure. Lifestylechanges  Control your weight.  Avoid smoking.  Stay physically active.  Reduce the amount of salt in your diet.  Reduce stress.  Control any chronic conditions, such as high cholesterol or diabetes.  Reduce your alcohol intake. Medicines  Several medicines (antihypertensive medicines) are available, if needed, to bring blood pressure within a normal range. Communication  Review all the medicines you take with your caregiver because there may be side effects or interactions.  Talk with your caregiver about your diet, exercise habits, and other lifestyle factors that may be contributing to high blood pressure.  See your caregiver regularly. Your caregiver can help you create and adjust your plan for managing high blood pressure. RECOMMENDATIONS FOR TREATMENT AND FOLLOW-UP  The following recommendations are based on current guidelines for managing high blood pressure in nonpregnant adults. Use these recommendations to identify the proper follow-up period or treatment option based on your blood pressure reading. You can discuss these options with your caregiver.  Systolic pressure of 825 to 189 or diastolic pressure of 80 to 89: Follow up with your caregiver as directed.  Systolic pressure of 842 to 103 or diastolic pressure of 90 to 100: Follow up with your caregiver within 2 months.  Systolic pressure above 128 or diastolic pressure above 118: Follow up with your caregiver within 1 month.  Systolic pressure above 867 or diastolic pressure above 737: Consider antihypertensive therapy;  follow up with your caregiver within 1 week.  Systolic pressure above 366 or diastolic pressure above 815: Begin antihypertensive therapy; follow up with your caregiver within 1 week. Document Released: 02/18/2012 Document Reviewed: 02/18/2012 [facility] Patient Information 2014 [place], [place].

## 2013-06-30 NOTE — Progress Notes (Signed)
Patient ID: Johnathan Arnold, male   DOB: 02/19/1946, 68 y.o.   MRN: [id number]  The patient is here for annual Medicare wellness examination and management of other chronic and acute problems,  including CAD, hyperlipidemia and DM.  He has noted a Change in bowel habits,  Used  to have stools daily but lately has been having BMs  more like every 3 to 4 days.  No bleeding noted.  He has been having stabbing pains in right lateral foot which occur infrequently.  He has noted a slight tremor of right hand noted with reading the paper    The risk factors are reflected in the social history.  The roster of all physicians providing medical care to patient - is listed in the Snapshot section of the chart.  Activities of daily living:  The patient is 100% independent in all ADLs: dressing, toileting, feeding as well as independent mobility  Home safety : The patient has smoke detectors in the home. They wear seatbelts.  There are no firearms at home. There is no violence in the home.   There is no risks for hepatitis, STDs or HIV. There is no   history of blood transfusion. They have no travel history to infectious disease endemic areas of the world.  The patient has seen their dentist in the last six month. They have seen their eye doctor in the last year. They admit to slight hearing difficulty with regard to whispered voices and some television programs.  They have deferred audiologic testing in the last year.  They do not  have excessive sun exposure. Discussed the need for sun protection: hats, long sleeves and use of sunscreen if there is significant sun exposure.   Diet: the importance of a healthy diet is discussed. They do have a healthy diet.  The benefits of regular aerobic exercise were discussed. She walks 4 times per week ,  20 minutes.   Depression screen: there are no signs or vegative symptoms of depression- irritability, change in appetite, anhedonia, sadness/tearfullness.  Cognitive  assessment: the patient manages all their financial and personal affairs and is actively engaged. They could relate day,date,year and events; recalled 2/3 objects at 3 minutes; performed clock-face test normally.  The following portions of the patient's history were reviewed and updated as appropriate: allergies, current medications, past family history, past medical history,  past surgical history, past social history  and problem list.  Visual acuity was not assessed per patient preference since she has regular follow up with her ophthalmologist. Hearing and body mass index were assessed and reviewed.   During the course of the visit the patient was educated and counseled about appropriate screening and preventive services including : fall prevention , diabetes screening, nutrition counseling, colorectal cancer screening, and recommended immunizations.    Objective:  BP 158/78  Pulse 60  Temp(Src) 98.3 F (36.8 C) (Oral)  Resp 16  Ht 5\' 6"  (1.676 m)  Wt 192 lb (87.091 kg)  BMI 31.00 kg/m2  SpO2 99%  General Appearance:    Alert, cooperative, no distress, appears stated age  Head:    Normocephalic, without obvious abnormality, atraumatic  Eyes:    PERRL, conjunctiva/corneas clear, EOM's intact, fundi    benign, both eyes       Ears:    Normal TM's and external ear canals, both ears  Nose:   Nares normal, septum midline, mucosa normal, no drainage   or sinus tenderness  Throat:   Lips, mucosa, and  tongue normal; teeth and gums normal  Neck:   Supple, symmetrical, trachea midline, no adenopathy;       thyroid:  No enlargement/tenderness/nodules; no carotid   bruit or JVD  Back:     Symmetric, no curvature, ROM normal, no CVA tenderness  Lungs:     Clear to auscultation bilaterally, respirations unlabored  Chest wall:    No tenderness or deformity  Heart:    Regular rate and rhythm, S1 and S2 normal, no murmur, rub   or gallop  Abdomen:     Soft, non-tender, bowel sounds active all  four quadrants,    no masses, no organomegaly  Genitalia:    Normal male without lesion, discharge or tenderness  Rectal:    Normal tone, normal prostate, no masses or tenderness;   guaiac negative stool  Extremities:   Extremities normal, atraumatic, no cyanosis or edema  Pulses:   2+ and symmetric all extremities  Skin:   Skin color, texture, turgor normal, no rashes or lesions  Lymph nodes:   Cervical, supraclavicular, and axillary nodes normal  Neurologic:   CNII-XII intact. 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  Diabetes mellitus type 2, controlled Well-controlled on current medications.  hemoglobin A1c has been consistently less than 7.0 . He is up-to-date on eye exams and his foot exam is normal. l we'll repeat his urine microalbumin to creatinine ratio at next visit. He is on the appropriate medications.  Lab Results  Component Value Date   HGBA1C 6.2 04/01/2013   Lab Results  Component Value Date   MICROALBUR 5.4* 12/23/2012     Hyperlipidemia Well controlled on current statin therapy.   Liver enzymes are normal , no changes today.  Lab Results  Component Value Date   CHOL 142 04/01/2013   HDL 49.20 04/01/2013   LDLCALC 65 04/01/2013   TRIG 139.0 04/01/2013   CHOLHDL 3 04/01/2013   Lab Results  Component Value Date   ALT 32 04/01/2013   AST 25 04/01/2013   ALKPHOS 44 04/01/2013   BILITOT 0.7 04/01/2013     Hypertension Elevated today.  Reviewed list of meds, patient is not taking OTC meds that could be causing,. It.  Have asked patient to recheck bp at home a minimum of 5 times over the next 4 weeks and call readings to office for adjustment of medications.    Encounter for preventive health examination Annual male exam was done including testicular and prostate exam. PSA is pending .  Colon ca screening was reviewed and  options given.    Change in bowel habits Discussed recent development of constipation and advised to increase water and fiber intake.  Home FOBTS given.   Obesity, unspecified I have addressed  BMI and recommended a low glycemic index diet utilizing smaller more frequent meals to increase metabolism.  I have also recommended that patient start exercising with a goal of 30 minutes of aerobic exercise a minimum of 5 days per week.     Updated Medication List Outpatient Encounter Prescriptions as of 06/30/2013  Medication Sig  . aspirin 81 MG tablet Take by mouth. Takes 2 tablets daily.  Marland Kitchen atenolol (TENORMIN) 50 MG tablet TAKE 1 TABLET BY MOUTH EVERY DAY  . fish oil-omega-3 fatty acids 1000 MG capsule Take 2 g by mouth daily.    . isosorbide mononitrate (IMDUR) 60  MG 24 hr tablet Take 1 tablet (60 mg total) by mouth daily. Pt takes 90 mg daily. 1 30 mg tablet and 1 60 mg tablet daily.  Marland Kitchen lisinopril (PRINIVIL,ZESTRIL) 40 MG tablet Take 1 tablet (40 mg total) by mouth daily.  . metFORMIN (GLUCOPHAGE) 500 MG tablet Take 1 tablet (500 mg total) by mouth 2 (two) times daily with a meal.  . Multiple Vitamin (MULTIVITAMIN) tablet Take 1 tablet by mouth daily.    Marland Kitchen omeprazole (PRILOSEC) 20 MG capsule   . ranolazine (RANEXA) 500 MG 12 hr tablet Take 1 tablet (500 mg total) by mouth 2 (two) times daily.  . simvastatin (ZOCOR) 20 MG tablet Take 1 tablet (20 mg total) by mouth at bedtime.  Marland Kitchen zolpidem (AMBIEN) 10 MG tablet Take 1 tablet (10 mg total) by mouth at bedtime as needed for sleep.  . nitroGLYCERIN (NITROSTAT) 0.4 MG SL tablet Place 1 tablet (0.4 mg total) under the tongue every 5 (five) minutes as needed.  . [DISCONTINUED] isosorbide mononitrate (IMDUR) 30 MG 24 hr tablet TAKE 1 TABLET BY MOUTH EVERY DAY

## 2013-06-30 NOTE — Progress Notes (Signed)
Pre-visit discussion using our clinic review tool. No additional management support is needed unless otherwise documented below in the visit note.  

## 2013-07-02 ENCOUNTER — Encounter: Payer: Self-pay | Admitting: Internal Medicine

## 2013-07-02 DIAGNOSIS — R194 Change in bowel habit: Secondary | ICD-10-CM | POA: Insufficient documentation

## 2013-07-02 DIAGNOSIS — Z0000 Encounter for general adult medical examination without abnormal findings: Secondary | ICD-10-CM | POA: Insufficient documentation

## 2013-07-02 NOTE — Assessment & Plan Note (Signed)
Discussed recent development of constipation and advised to increase water and fiber intake.  Home FOBTS given.

## 2013-07-02 NOTE — Assessment & Plan Note (Signed)
Annual male exam was done including testicular and prostate exam. PSA is pending .  Colon ca screening was reviewed and options given.   

## 2013-07-02 NOTE — Assessment & Plan Note (Addendum)
Elevated today.  Reviewed list of meds, patient is not taking OTC meds that could be causing,. It.  Have asked patient to recheck bp at home a minimum of 5 times over the next 4 weeks and call readings to office for adjustment of medications.   

## 2013-07-02 NOTE — Assessment & Plan Note (Signed)
His episodes have not recurred.

## 2013-07-02 NOTE — Assessment & Plan Note (Signed)
Well controlled on current statin therapy.   Liver enzymes are normal , no changes today.  Lab Results  Component Value Date   CHOL 142 04/01/2013   HDL 49.20 04/01/2013   LDLCALC 65 04/01/2013   TRIG 139.0 04/01/2013   CHOLHDL 3 04/01/2013   Lab Results  Component Value Date   ALT 32 04/01/2013   AST 25 04/01/2013   ALKPHOS 44 04/01/2013   BILITOT 0.7 04/01/2013

## 2013-07-02 NOTE — Assessment & Plan Note (Signed)
Well-controlled on current medications.  hemoglobin A1c has been consistently less than 7.0 . He is up-to-date on eye exams and his foot exam is normal. l we'll repeat his urine microalbumin to creatinine ratio at next visit. He is on the appropriate medications.  Lab Results  Component Value Date   HGBA1C 6.2 04/01/2013   Lab Results  Component Value Date   MICROALBUR 5.4* 12/23/2012

## 2013-07-02 NOTE — Assessment & Plan Note (Signed)
I have addressed  BMI and recommended a low glycemic index diet utilizing smaller more frequent meals to increase metabolism.  I have also recommended that patient start exercising with a goal of 30 minutes of aerobic exercise a minimum of 5 days per week.  

## 2013-07-07 ENCOUNTER — Other Ambulatory Visit (INDEPENDENT_AMBULATORY_CARE_PROVIDER_SITE_OTHER): Payer: Medicare Other

## 2013-07-07 DIAGNOSIS — E785 Hyperlipidemia, unspecified: Secondary | ICD-10-CM | POA: Diagnosis not present

## 2013-07-07 DIAGNOSIS — E119 Type 2 diabetes mellitus without complications: Secondary | ICD-10-CM

## 2013-07-07 DIAGNOSIS — Z125 Encounter for screening for malignant neoplasm of prostate: Secondary | ICD-10-CM

## 2013-07-07 LAB — COMPREHENSIVE METABOLIC PANEL
ALT: 26 U/L (ref 0–53)
AST: 21 U/L (ref 0–37)
Albumin: 4.2 g/dL (ref 3.5–5.2)
Alkaline Phosphatase: 52 U/L (ref 39–117)
BUN: 14 mg/dL (ref 6–23)
CHLORIDE: 104 meq/L (ref 96–112)
CO2: 29 meq/L (ref 19–32)
CREATININE: 1.4 mg/dL (ref 0.4–1.5)
Calcium: 9.7 mg/dL (ref 8.4–10.5)
GFR: 54.38 mL/min — AB (ref >60.00)
GLUCOSE: 116 mg/dL — AB (ref 70–99)
Potassium: 4.9 mEq/L (ref 3.5–5.1)
Sodium: 139 mEq/L (ref 135–145)
Total Bilirubin: 0.9 mg/dL (ref 0.3–1.2)
Total Protein: 6.6 g/dL (ref 6.0–8.3)

## 2013-07-07 LAB — PSA, MEDICARE: PSA: 1 ng/mL (ref 0.10–4.00)

## 2013-07-07 LAB — MICROALBUMIN / CREATININE URINE RATIO
Creatinine,U: 96.2 mg/dL
MICROALB/CREAT RATIO: 2.9 mg/g (ref 0.0–30.0)
Microalb, Ur: 2.8 mg/dL — ABNORMAL HIGH (ref 0.0–1.9)

## 2013-07-07 LAB — LIPID PANEL
CHOL/HDL RATIO: 3
Cholesterol: 141 mg/dL (ref 0–200)
HDL: 45.2 mg/dL (ref >39.00)
LDL Cholesterol: 67 mg/dL (ref 0–99)
TRIGLYCERIDES: 144 mg/dL (ref 0.0–149.0)
VLDL: 28.8 mg/dL (ref 0.0–40.0)

## 2013-07-07 LAB — HEMOGLOBIN A1C: Hgb A1c MFr Bld: 5.9 % (ref 4.6–6.5)

## 2013-07-08 ENCOUNTER — Encounter: Payer: Self-pay | Admitting: Internal Medicine

## 2013-07-21 ENCOUNTER — Encounter: Payer: Self-pay | Admitting: Internal Medicine

## 2013-07-21 ENCOUNTER — Encounter: Payer: Self-pay | Admitting: Cardiovascular Disease

## 2013-07-22 ENCOUNTER — Other Ambulatory Visit: Payer: Self-pay | Admitting: *Deleted

## 2013-07-22 MED ORDER — ISOSORBIDE MONONITRATE ER 30 MG PO TB24: 30.0000 mg | ORAL_TABLET | Freq: Every day | ORAL | Status: DC

## 2013-07-22 NOTE — Telephone Encounter (Signed)
Requested Prescriptions   Signed Prescriptions Disp Refills  . isosorbide mononitrate (IMDUR) 30 MG 24 hr tablet 90 tablet 3    Sig: Take 1 tablet (30 mg total) by mouth daily.    Authorizing Provider: [name] A    Ordering User: Britt Bottom

## 2013-08-04 ENCOUNTER — Encounter: Payer: Self-pay | Admitting: Internal Medicine

## 2013-08-04 ENCOUNTER — Ambulatory Visit: Payer: Medicare Other | Admitting: Cardiovascular Disease

## 2013-08-05 MED ORDER — LOSARTAN POTASSIUM 100 MG PO TABS: 100.0000 mg | ORAL_TABLET | Freq: Every day | ORAL | Status: DC

## 2013-08-09 LAB — HM DIABETES FOOT EXAM: HM DIABETIC FOOT EXAM: NORMAL

## 2013-08-18 ENCOUNTER — Ambulatory Visit: Payer: Medicare Other | Admitting: Cardiovascular Disease

## 2013-08-22 ENCOUNTER — Other Ambulatory Visit: Payer: Self-pay | Admitting: Cardiovascular Disease

## 2013-08-29 ENCOUNTER — Encounter: Payer: Self-pay | Admitting: Cardiovascular Disease

## 2013-08-29 ENCOUNTER — Ambulatory Visit (INDEPENDENT_AMBULATORY_CARE_PROVIDER_SITE_OTHER): Payer: Medicare Other | Admitting: Cardiovascular Disease

## 2013-08-29 VITALS — BP 134/76 | HR 54 | Ht 67.0 in | Wt 193.5 lb

## 2013-08-29 DIAGNOSIS — I2510 Atherosclerotic heart disease of native coronary artery without angina pectoris: Secondary | ICD-10-CM

## 2013-08-29 DIAGNOSIS — I10 Essential (primary) hypertension: Secondary | ICD-10-CM

## 2013-08-29 DIAGNOSIS — E1169 Type 2 diabetes mellitus with other specified complication: Secondary | ICD-10-CM

## 2013-08-29 DIAGNOSIS — I209 Angina pectoris, unspecified: Secondary | ICD-10-CM | POA: Diagnosis not present

## 2013-08-29 DIAGNOSIS — E785 Hyperlipidemia, unspecified: Secondary | ICD-10-CM | POA: Diagnosis not present

## 2013-08-29 DIAGNOSIS — Z951 Presence of aortocoronary bypass graft: Secondary | ICD-10-CM

## 2013-08-29 MED ORDER — RANOLAZINE ER 1000 MG PO TB12: 1000.0000 mg | ORAL_TABLET | Freq: Two times a day (BID) | ORAL | Status: DC

## 2013-08-29 NOTE — Patient Instructions (Signed)
Change Isosorbide (Imdur) to 60 mg once daily.  Increase Ranexa to 1000 mg twice daily.   Your physician wants you to follow-up in: 6 months.  You will receive a reminder letter in the mail two months in advance. If you don't receive a letter, please call our office to schedule the follow-up appointment.

## 2013-08-29 NOTE — Progress Notes (Signed)
HPI  This is a pleasant 68 year old male who is here today for a followup visit.  He has known history of coronary artery disease status post CABG in 1999. He also has known history of hypertension, hyperlipidemia and type 2 diabetes. He has done well from a cardiac standpoint up until 2 years ago when he started having mild exertional chest pain. He underwent a cardiac catheterization in 2011 which showed patent grafts. He continued to have mild exertional chest pain. He underwent a treadmill stress test in June of 2013 which showed no evidence of ischemia. He was able to exercise for 9-1/2 minutes with mild chest discomfort but no ischemic ECG changes. He underwent a treadmill nuclear stress test in 06/2012 which showed normal ejection fraction. Fixed inferior wall defect with normal wall motion and moderate reversibility in the septal wall.  He was started him on Ranexa 500 mg twice daily with improvement in angina. He is doing well overall. He continues to have mild angina.   No Known Allergies   Current Outpatient Prescriptions on File Prior to Visit  Medication Sig Dispense Refill  . aspirin 81 MG tablet Take by mouth. Takes 2 tablets daily.      Marland Kitchen atenolol (TENORMIN) 50 MG tablet TAKE 1 TABLET BY MOUTH EVERY DAY  90 tablet  3  . fish oil-omega-3 fatty acids 1000 MG capsule Take 2 g by mouth daily.        . isosorbide mononitrate (IMDUR) 60 MG 24 hr tablet Take 1 tablet (60 mg total) by mouth daily. Pt takes 90 mg daily. 1 30 mg tablet and 1 60 mg tablet daily.  90 tablet  3  . losartan (COZAAR) 100 MG tablet Take 1 tablet (100 mg total) by mouth daily.  90 tablet  3  . metFORMIN (GLUCOPHAGE) 500 MG tablet Take 1 tablet (500 mg total) by mouth 2 (two) times daily with a meal.  180 tablet  1  . Multiple Vitamin (MULTIVITAMIN) tablet Take 1 tablet by mouth daily.        . nitroGLYCERIN (NITROSTAT) 0.4 MG SL tablet Place 1 tablet (0.4 mg total) under the tongue every 5 (five) minutes as  needed.  30 tablet  3  . omeprazole (PRILOSEC) 20 MG capsule       . simvastatin (ZOCOR) 20 MG tablet Take 1 tablet (20 mg total) by mouth at bedtime.  90 tablet  3  . zolpidem (AMBIEN) 10 MG tablet Take 1 tablet (10 mg total) by mouth at bedtime as needed for sleep.  30 tablet  3   No current facility-administered medications on file prior to visit.     Past Medical History  Diagnosis Date  . 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 Diabetes mellitus without complication   . History of cardiac catheterization 2011    [facility]  . 3-vessel coronary artery disease     s/p  5 vessel CABG  . Hypertension   . Hypertriglyceridemia   . Hyperlipidemia      Past Surgical History  Procedure Laterality Date  . Coronary artery bypass graft  03/1998    5 vessel, [facility]  . Cardiac catheterization  05-19-2010    [facility]: Patent grafts. LIMA to LAD, SVG to D1, OM1 and RPDA     Family History  Problem Relation Age of Onset  . Heart attack Mother 53  . Diabetes Mother   . Hypertension Mother   . Heart attack Father 70  .  Heart disease Father      History   Social History  . Marital Status: Married    Spouse Name: N/A    Number of Children: N/A  . Years of Education: N/A   Occupational History  . Not on file.   Social History Main Topics  . Smoking status: Never Smoker   . Smokeless tobacco: Never Used  . Alcohol Use: No  . Drug Use: No  . Sexual Activity: Not on file   Other Topics Concern  . Not on file   Social History Narrative  . No narrative on file        PHYSICAL EXAM   BP 134/76  Pulse 54  Ht 5\' 7"  (1.702 m)  Wt 193 lb 8 oz (87.771 kg)  BMI 30.30 kg/m2 Constitutional: He is oriented to person, place, and time. He appears well-developed and well-nourished. No distress.  HENT: No nasal discharge.  Head: Normocephalic and atraumatic.  Eyes: Pupils are equal and round. Right eye exhibits no discharge. Left eye exhibits no discharge.  Neck: Normal range of motion.  Neck supple. No JVD present. No thyromegaly present.  Cardiovascular: Normal rate, regular rhythm, normal heart sounds and. Exam reveals no gallop and no friction rub. No murmur heard.  Pulmonary/Chest: Effort normal and breath sounds normal. No stridor. No respiratory distress. He has no wheezes. He has no rales. He exhibits no tenderness.  Abdominal: Soft. Bowel sounds are normal. He exhibits no distension. There is no tenderness. There is no rebound and no guarding.  Musculoskeletal: Normal range of motion. He exhibits no edema and no tenderness.  Neurological: He is alert and oriented to person, place, and time. Coordination normal.  Skin: Skin is warm and dry. No rash noted. He is not diaphoretic. No erythema. No pallor.  Psychiatric: He has a normal mood and affect. His behavior is normal. Judgment and thought content normal.       EKG: Sinus  Bradycardia  -Left atrial enlargement.   -Nonspecific ST and T wave changes    ASSESSMENT AND PLAN

## 2013-08-29 NOTE — Assessment & Plan Note (Addendum)
Blood pressure is controlled on current medications. I will consider switching atenolol to carvedilol given that he is diabetic and also has resting bradycardia.

## 2013-08-29 NOTE — Assessment & Plan Note (Signed)
He is overall stable from a cardiac standpoint. He continues to have mild angina. He noted no improvement with increasing the dose of isosorbide. Thus, I changed the dose back to 60 mg once daily. I also increased the dose of Ranexa to 1000 mg twice daily.

## 2013-08-29 NOTE — Assessment & Plan Note (Signed)
Lab Results  Component Value Date   CHOL 141 07/07/2013   HDL 45.20 07/07/2013   LDLCALC 67 07/07/2013   TRIG 144.0 07/07/2013   CHOLHDL 3 07/07/2013   Continue treatment with simvastatin.

## 2013-09-01 ENCOUNTER — Other Ambulatory Visit (INDEPENDENT_AMBULATORY_CARE_PROVIDER_SITE_OTHER): Payer: Medicare Other

## 2013-09-01 DIAGNOSIS — R194 Change in bowel habit: Secondary | ICD-10-CM

## 2013-09-01 DIAGNOSIS — R198 Other specified symptoms and signs involving the digestive system and abdomen: Secondary | ICD-10-CM

## 2013-09-01 LAB — FECAL OCCULT BLOOD, IMMUNOCHEMICAL: Fecal Occult Bld: NEGATIVE

## 2013-09-04 ENCOUNTER — Encounter: Payer: Self-pay | Admitting: Internal Medicine

## 2013-10-07 ENCOUNTER — Ambulatory Visit (INDEPENDENT_AMBULATORY_CARE_PROVIDER_SITE_OTHER): Payer: Medicare Other | Admitting: Internal Medicine

## 2013-10-07 ENCOUNTER — Encounter: Payer: Self-pay | Admitting: Internal Medicine

## 2013-10-07 VITALS — BP 168/78 | HR 48 | Temp 98.0°F | Resp 16 | Wt 196.5 lb

## 2013-10-07 DIAGNOSIS — E1169 Type 2 diabetes mellitus with other specified complication: Secondary | ICD-10-CM

## 2013-10-07 DIAGNOSIS — E119 Type 2 diabetes mellitus without complications: Secondary | ICD-10-CM | POA: Diagnosis not present

## 2013-10-07 DIAGNOSIS — R198 Other specified symptoms and signs involving the digestive system and abdomen: Secondary | ICD-10-CM | POA: Diagnosis not present

## 2013-10-07 DIAGNOSIS — E669 Obesity, unspecified: Secondary | ICD-10-CM | POA: Diagnosis not present

## 2013-10-07 DIAGNOSIS — R194 Change in bowel habit: Secondary | ICD-10-CM

## 2013-10-07 DIAGNOSIS — E785 Hyperlipidemia, unspecified: Secondary | ICD-10-CM

## 2013-10-07 DIAGNOSIS — I209 Angina pectoris, unspecified: Secondary | ICD-10-CM

## 2013-10-07 DIAGNOSIS — I10 Essential (primary) hypertension: Secondary | ICD-10-CM

## 2013-10-07 LAB — COMPREHENSIVE METABOLIC PANEL
ALT: 30 U/L (ref 0–53)
AST: 22 U/L (ref 0–37)
Albumin: 4.2 g/dL (ref 3.5–5.2)
Alkaline Phosphatase: 46 U/L (ref 39–117)
BUN: 12 mg/dL (ref 6–23)
CALCIUM: 9.1 mg/dL (ref 8.4–10.5)
CO2: 26 mEq/L (ref 19–32)
CREATININE: 1.3 mg/dL (ref 0.4–1.5)
Chloride: 105 mEq/L (ref 96–112)
GFR: 58.22 mL/min — AB (ref >60.00)
Glucose, Bld: 104 mg/dL — ABNORMAL HIGH (ref 70–99)
Potassium: 4.8 mEq/L (ref 3.5–5.1)
Sodium: 138 mEq/L (ref 135–145)
Total Bilirubin: 0.7 mg/dL (ref 0.3–1.2)
Total Protein: 6.6 g/dL (ref 6.0–8.3)

## 2013-10-07 LAB — LIPID PANEL
CHOL/HDL RATIO: 2
Cholesterol: 119 mg/dL (ref 0–200)
HDL: 48.1 mg/dL (ref >39.00)
LDL Cholesterol: 43 mg/dL (ref 0–99)
TRIGLYCERIDES: 138 mg/dL (ref 0.0–149.0)
VLDL: 27.6 mg/dL (ref 0.0–40.0)

## 2013-10-07 LAB — HEMOGLOBIN A1C: Hgb A1c MFr Bld: 5.8 % (ref 4.6–6.5)

## 2013-10-07 NOTE — Progress Notes (Signed)
Pre-visit discussion using our clinic review tool. No additional management support is needed unless otherwise documented below in the visit note.  

## 2013-10-07 NOTE — Patient Instructions (Addendum)
Trial of once daily miralax  About 2 hours before bedtime or right after dinner  Increase the water intake to 3 16 ounce servings daily   If no change in constipation  After a week, let me know  Try suspending the metformin for 3 months  Snack on high protein foods like prosciutto/mozzarella

## 2013-10-07 NOTE — Progress Notes (Signed)
Patient ID: Johnathan Arnold, male   DOB: Sep 19, 1945, 68 y.o.   MRN: [id number]   Patient Active Problem List   Diagnosis Date Noted  . Encounter for preventive health examination 07/02/2013  . Change in bowel habits 07/02/2013  . Obesity, unspecified 04/03/2013  . Other malaise and fatigue 09/21/2012  . Hyperlipidemia   . Hypertension   . 3-vessel coronary artery disease   . S/P CABG x 5   . Hypertriglyceridemia   . Diabetes mellitus type 2, controlled 05/21/2011  . 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 Follow-up  . Diabetes  . Hypertension    HPI:   Johnathan Arnold is a 68 y.o. male who presents for Follow up on chronic conditions including DM type 2,  CAD, GERD,  Hyperlipidemia, and hypertension.  New c/o  6 months history of constipation,  Despite increasing his fiber intake by  adding 3 times daily benefiber for over a week.,  Made no difference .  Denies rectal bleeding, abd pain and wt loss.  Up to date on colonoscopy which was done in 2011. No use of narcotics or calcium channel blockers.  Coincides with initiation of ranexa which causes constipation in 5% of patients who report adverse events.  Taking all medications as prescribed.  Does not check BS unless on rare occasions when feeling bad. No lows.  Exercising 5 days/week using treadmill    Past Medical History  Diagnosis Date  . 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 Diabetes mellitus without complication   . History of cardiac catheterization 2011    [facility]  . 3-vessel coronary artery disease     s/p  5 vessel CABG  . Hypertension   . Hypertriglyceridemia   . Hyperlipidemia     Past Surgical History  Procedure Laterality Date  . Coronary artery bypass graft  03/1998    5 vessel, [facility]  . Cardiac catheterization  05-19-2010    [facility]: Patent grafts. LIMA to LAD, SVG to D1, OM1 and RPDA       The following portions  of the patient's history were reviewed and updated as appropriate: Allergies, current medications, and problem list.    Review of Systems:   Patient denies headache, fevers, malaise, unintentional weight loss, skin rash, eye pain, sinus congestion and sinus pain, sore throat, dysphagia,  hemoptysis , cough, dyspnea, wheezing, chest pain, palpitations, orthopnea, edema, abdominal pain, nausea, melena, diarrhea, constipation, flank pain, dysuria, hematuria, urinary  Frequency, nocturia, numbness, tingling, seizures,  Focal weakness, Loss of consciousness,  Tremor, insomnia, depression, anxiety, and suicidal ideation.     History   Social History  . Marital Status: Married    Spouse Name: N/A    Number of Children: N/A  . Years of Education: N/A   Occupational History  . Not on file.   Social History Main Topics  . Smoking status: Never Smoker   . Smokeless tobacco: Never Used  . Alcohol Use: No  . Drug Use: No  . Sexual Activity: Not on file   Other Topics Concern  . Not on file   Social History Narrative  . No narrative on file    Objective:  Filed Vitals:   10/07/13 0803  BP: 168/78  Pulse: 48  Temp: 98 F (36.7 C)  Resp: 16     General appearance: alert, cooperative and appears stated age Ears: normal TM's  and external ear canals both ears Throat: lips, mucosa, and tongue normal; teeth and gums normal Neck: no adenopathy, no carotid bruit, supple, symmetrical, trachea midline and thyroid not enlarged, symmetric, no tenderness/mass/nodules Back: symmetric, no curvature. ROM normal. No CVA tenderness. Lungs: clear to auscultation bilaterally Heart: regular rate and rhythm, S1, S2 normal, no murmur, click, rub or gallop Abdomen: soft, non-tender; bowel sounds normal; no masses,  no organomegaly Pulses: 2+ and symmetric Skin: Skin color, texture, turgor normal. No rashes or lesions Lymph nodes: Cervical, supraclavicular, and axillary nodes normal.  Assessment  and Plan:  Hyperlipidemia Well controlled on current statin therapy.   Liver enzymes are normal , no changes today.  Lab Results  Component Value Date   CHOL 119 10/07/2013   HDL 48.10 10/07/2013   LDLCALC 43 10/07/2013   TRIG 138.0 10/07/2013   CHOLHDL 2 10/07/2013   Lab Results  Component Value Date   ALT 30 10/07/2013   AST 22 10/07/2013   ALKPHOS 46 10/07/2013   BILITOT 0.7 10/07/2013       Diabetes mellitus type 2, controlled Well-controlled on metformin alone.  hemoglobin A1c has been consistently less than 6.5 . He is up-to-date on eye exams and his foot exam is normal. Discussed stopping his metformin for 3 months.   Lab Results  Component Value Date   HGBA1C 5.8 10/07/2013   Lab Results  Component Value Date   MICROALBUR 2.8* 07/07/2013       Obesity, unspecified Body mass index is 30.77 kg/(m^2). HisBMI remains unchanged despite exercise. We discussed the nature and quality of his exercises well as of his diet. Usually what I find is that people are not exercising as vigorously as they should to achieve a sustained heart rate in the aerobic zone.  I also am advising him to get back on the low GI diet using six smaller meals a day to stimulate his  metabolism.    Change in bowel habits Discussed recent development of constipationdespite an  increase in water and fiber intake. May be a Ranexa side effect,  Trial of miralax daily   Hypertension Elevated today.  Reviewed list of meds, patient is not taking OTC meds that could be causing,. It.  Have asked patient to recheck bp at home a minimum of 5 times over the next 4 weeks and call readings to office for adjustment of medications.       Updated Medication List Outpatient Encounter Prescriptions as of 10/07/2013  Medication Sig  . aspirin 81 MG tablet Take by mouth. Takes 2 tablets daily.  Marland Kitchen atenolol (TENORMIN) 50 MG tablet TAKE 1 TABLET BY MOUTH EVERY DAY  . fish oil-omega-3 fatty acids 1000 MG capsule Take 2 g by mouth  daily.    . isosorbide mononitrate (IMDUR) 60 MG 24 hr tablet Take 60 mg by mouth daily. 60 mg tablet daily.  Marland Kitchen losartan (COZAAR) 100 MG tablet Take 1 tablet (100 mg total) by mouth daily.  . Multiple Vitamin (MULTIVITAMIN) tablet Take 1 tablet by mouth daily.    . nitroGLYCERIN (NITROSTAT) 0.4 MG SL tablet Place 1 tablet (0.4 mg total) under the tongue every 5 (five) minutes as needed.  Marland Kitchen omeprazole (PRILOSEC) 20 MG capsule   . ranolazine (RANEXA) 1000 MG SR tablet Take 1 tablet (1,000 mg total) by mouth 2 (two) times daily.  . simvastatin (ZOCOR) 20 MG tablet Take 1 tablet (20 mg total) by mouth at bedtime.  Marland Kitchen zolpidem (AMBIEN) 10 MG tablet Take 1  tablet (10 mg total) by mouth at bedtime as needed for sleep.  . [DISCONTINUED] isosorbide mononitrate (IMDUR) 60 MG 24 hr tablet Take 1 tablet (60 mg total) by mouth daily. Pt takes 90 mg daily. 1 30 mg tablet and 1 60 mg tablet daily.  . [DISCONTINUED] metFORMIN (GLUCOPHAGE) 500 MG tablet Take 1 tablet (500 mg total) by mouth 2 (two) times daily with a meal.     Orders Placed This Encounter  Procedures  . Hemoglobin A1c  . Comprehensive metabolic panel  . Lipid panel    Return in about 3 months (around 01/07/2014) for follow up diabetes.

## 2013-10-08 ENCOUNTER — Encounter: Payer: Self-pay | Admitting: Internal Medicine

## 2013-10-09 ENCOUNTER — Encounter: Payer: Self-pay | Admitting: Internal Medicine

## 2013-10-09 NOTE — Assessment & Plan Note (Signed)
Discussed recent development of constipationdespite an  increase in water and fiber intake. May be a Ranexa side effect,  Trial of miralax daily

## 2013-10-09 NOTE — Assessment & Plan Note (Signed)
Well controlled on current statin therapy.   Liver enzymes are normal , no changes today.  Lab Results  Component Value Date   CHOL 119 10/07/2013   HDL 48.10 10/07/2013   LDLCALC 43 10/07/2013   TRIG 138.0 10/07/2013   CHOLHDL 2 10/07/2013   Lab Results  Component Value Date   ALT 30 10/07/2013   AST 22 10/07/2013   ALKPHOS 46 10/07/2013   BILITOT 0.7 10/07/2013

## 2013-10-09 NOTE — Assessment & Plan Note (Signed)
Elevated today.  Reviewed list of meds, patient is not taking OTC meds that could be causing,. It.  Have asked patient to recheck bp at home a minimum of 5 times over the next 4 weeks and call readings to office for adjustment of medications.   

## 2013-10-09 NOTE — Assessment & Plan Note (Signed)
Well-controlled on metformin alone.  hemoglobin A1c has been consistently less than 6.5 . He is up-to-date on eye exams and his foot exam is normal. Discussed stopping his metformin for 3 months.   Lab Results  Component Value Date   HGBA1C 5.8 10/07/2013   Lab Results  Component Value Date   MICROALBUR 2.8* 07/07/2013

## 2013-10-09 NOTE — Assessment & Plan Note (Signed)
Body mass index is 30.77 kg/(m^2). HisBMI remains unchanged despite exercise. We discussed the nature and quality of his exercises well as of his diet. Usually what I find is that people are not exercising as vigorously as they should to achieve a sustained heart rate in the aerobic zone.  I also am advising him to get back on the low GI diet using six smaller meals a day to stimulate his  metabolism.

## 2013-11-22 ENCOUNTER — Other Ambulatory Visit: Payer: Self-pay | Admitting: Internal Medicine

## 2013-11-28 DIAGNOSIS — M5430 Sciatica, unspecified side: Secondary | ICD-10-CM | POA: Diagnosis not present

## 2013-11-28 DIAGNOSIS — M5137 Other intervertebral disc degeneration, lumbosacral region: Secondary | ICD-10-CM | POA: Diagnosis not present

## 2013-11-28 DIAGNOSIS — M999 Biomechanical lesion, unspecified: Secondary | ICD-10-CM | POA: Diagnosis not present

## 2014-01-02 ENCOUNTER — Encounter: Payer: Self-pay | Admitting: *Deleted

## 2014-01-02 NOTE — Progress Notes (Signed)
Chart reviewed for DM bundle. Appt sch 01/30/14

## 2014-01-09 DIAGNOSIS — M999 Biomechanical lesion, unspecified: Secondary | ICD-10-CM | POA: Diagnosis not present

## 2014-01-09 DIAGNOSIS — M5430 Sciatica, unspecified side: Secondary | ICD-10-CM | POA: Diagnosis not present

## 2014-01-09 DIAGNOSIS — M5137 Other intervertebral disc degeneration, lumbosacral region: Secondary | ICD-10-CM | POA: Diagnosis not present

## 2014-01-18 ENCOUNTER — Ambulatory Visit: Payer: Medicare Other | Admitting: Internal Medicine

## 2014-01-30 ENCOUNTER — Ambulatory Visit (INDEPENDENT_AMBULATORY_CARE_PROVIDER_SITE_OTHER): Payer: Medicare Other | Admitting: Internal Medicine

## 2014-01-30 ENCOUNTER — Encounter: Payer: Self-pay | Admitting: Internal Medicine

## 2014-01-30 VITALS — BP 128/72 | HR 51 | Temp 98.0°F | Resp 16 | Ht 67.0 in | Wt 192.2 lb

## 2014-01-30 DIAGNOSIS — N138 Other obstructive and reflux uropathy: Secondary | ICD-10-CM | POA: Diagnosis not present

## 2014-01-30 DIAGNOSIS — E781 Pure hyperglyceridemia: Secondary | ICD-10-CM

## 2014-01-30 DIAGNOSIS — R39198 Other difficulties with micturition: Secondary | ICD-10-CM

## 2014-01-30 DIAGNOSIS — I10 Essential (primary) hypertension: Secondary | ICD-10-CM | POA: Diagnosis not present

## 2014-01-30 DIAGNOSIS — N401 Enlarged prostate with lower urinary tract symptoms: Secondary | ICD-10-CM

## 2014-01-30 DIAGNOSIS — E669 Obesity, unspecified: Secondary | ICD-10-CM | POA: Diagnosis not present

## 2014-01-30 DIAGNOSIS — E119 Type 2 diabetes mellitus without complications: Secondary | ICD-10-CM

## 2014-01-30 DIAGNOSIS — E785 Hyperlipidemia, unspecified: Secondary | ICD-10-CM

## 2014-01-30 DIAGNOSIS — E1169 Type 2 diabetes mellitus with other specified complication: Secondary | ICD-10-CM | POA: Diagnosis not present

## 2014-01-30 DIAGNOSIS — R3912 Poor urinary stream: Principal | ICD-10-CM

## 2014-01-30 DIAGNOSIS — I209 Angina pectoris, unspecified: Secondary | ICD-10-CM | POA: Diagnosis not present

## 2014-01-30 DIAGNOSIS — R350 Frequency of micturition: Secondary | ICD-10-CM

## 2014-01-30 LAB — COMPREHENSIVE METABOLIC PANEL
ALK PHOS: 47 U/L (ref 39–117)
ALT: 29 U/L (ref 0–53)
AST: 25 U/L (ref 0–37)
Albumin: 4.3 g/dL (ref 3.5–5.2)
BILIRUBIN TOTAL: 0.9 mg/dL (ref 0.2–1.2)
BUN: 14 mg/dL (ref 6–23)
CO2: 28 meq/L (ref 19–32)
Calcium: 9.8 mg/dL (ref 8.4–10.5)
Chloride: 104 mEq/L (ref 96–112)
Creatinine, Ser: 1.5 mg/dL (ref 0.4–1.5)
GFR: 47.84 mL/min — AB (ref >60.00)
GLUCOSE: 121 mg/dL — AB (ref 70–99)
Potassium: 5 mEq/L (ref 3.5–5.1)
SODIUM: 140 meq/L (ref 135–145)
TOTAL PROTEIN: 7.2 g/dL (ref 6.0–8.3)

## 2014-01-30 LAB — LIPID PANEL
Cholesterol: 123 mg/dL (ref 0–200)
HDL: 39.6 mg/dL (ref >39.00)
LDL CALC: 48 mg/dL (ref 0–99)
NONHDL: 83.4
Total CHOL/HDL Ratio: 3
Triglycerides: 178 mg/dL — ABNORMAL HIGH (ref 0.0–149.0)
VLDL: 35.6 mg/dL (ref 0.0–40.0)

## 2014-01-30 LAB — MICROALBUMIN / CREATININE URINE RATIO
Creatinine,U: 243.5 mg/dL
MICROALB UR: 5.6 mg/dL — AB (ref 0.0–1.9)
Microalb Creat Ratio: 2.3 mg/g (ref 0.0–30.0)

## 2014-01-30 LAB — TSH: TSH: 3.48 u[IU]/mL (ref 0.35–4.50)

## 2014-01-30 LAB — HEMOGLOBIN A1C: HEMOGLOBIN A1C: 6.6 % — AB (ref 4.6–6.5)

## 2014-01-30 MED ORDER — ZOLPIDEM TARTRATE 10 MG PO TABS: 10.0000 mg | ORAL_TABLET | Freq: Every evening | ORAL | Status: DC | PRN

## 2014-01-30 NOTE — Patient Instructions (Signed)
Referral to Dr Elnoria Howard for prostate issues  I want you to lose 5 lbs this year to get your BMI safely under 30  Return in 3 months

## 2014-01-30 NOTE — Progress Notes (Signed)
Patient ID: Johnathan Arnold, male   DOB: Aug 04, 1945, 68 y.o.   MRN: [id number]   Patient Active Problem List   Diagnosis Date Noted  . Benign prostatic hypertrophy with urinary frequency 01/31/2014  . Encounter for preventive health examination 07/02/2013  . Change in bowel habits 07/02/2013  . Obesity, unspecified 04/03/2013  . Other malaise and fatigue 09/21/2012  . Hyperlipidemia   . Hypertension   . 3-vessel coronary artery disease   . S/P CABG x 5   . Hypertriglyceridemia   . Diabetes mellitus type 2, controlled 05/21/2011  . 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 Follow-up  . Diabetes    HPI:   Johnathan Arnold is a 68 y.o. male who presents for Follow up on DM Type 2 ,  HTN, obesity and  CAD.  Denies any hypoglycemic events,  Does not check sugars.  Eating too many servings of cake per week.  Not exercising regularly.  No chest pain.  Taking meds regularly without side effects.   New cc:  Decreased stream,  Increased urgency ,  Noticed that his urine stream sprays instead of coming out as a stream. Wants to see Dr Ernst Spell who has retired.    Past Medical History  Diagnosis Date  . 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 Diabetes mellitus without complication   . History of cardiac catheterization 2011    [facility]  . 3-vessel coronary artery disease     s/p  5 vessel CABG  . Hypertension   . Hypertriglyceridemia   . Hyperlipidemia     Past Surgical History  Procedure Laterality Date  . Coronary artery bypass graft  03/1998    5 vessel, [facility]  . Cardiac catheterization  05-19-2010    [facility]: Patent grafts. LIMA to LAD, SVG to D1, OM1 and RPDA       The following portions of the patient's history were reviewed and updated as appropriate: Allergies, current medications, and problem list.    Review of Systems:   Patient denies headache, fevers, malaise, unintentional weight  loss, skin rash, eye pain, sinus congestion and sinus pain, sore throat, dysphagia,  hemoptysis , cough, dyspnea, wheezing, chest pain, palpitations, orthopnea, edema, abdominal pain, nausea, melena, diarrhea, constipation, flank pain, dysuria, hematuria, urinary  Frequency, nocturia, numbness, tingling, seizures,  Focal weakness, Loss of consciousness,  Tremor, insomnia, depression, anxiety, and suicidal ideation.     History   Social History  . Marital Status: Married    Spouse Name: N/A    Number of Children: N/A  . Years of Education: N/A   Occupational History  . Not on file.   Social History Main Topics  . Smoking status: Never Smoker   . Smokeless tobacco: Never Used  . Alcohol Use: No  . Drug Use: No  . Sexual Activity: Not on file   Other Topics Concern  . Not on file   Social History Narrative  . No narrative on file    Objective:  Filed Vitals:   01/30/14 0806  BP: 128/72  Pulse: 51  Temp: 98 F (36.7 C)  Resp: 16     General appearance: alert, cooperative and appears stated age Neck: no adenopathy, no carotid bruit, supple, symmetrical, trachea midline and thyroid not enlarged, symmetric, no tenderness/mass/nodules Lungs: clear to auscultation bilaterally Heart: regular rate and rhythm, S1, S2 normal, no murmur, click, rub or  gallop Abdomen: soft, non-tender; bowel sounds normal; no masses,  no organomegaly Pulses: 2+ and symmetric Skin: Skin color, texture, turgor normal. No rashes or lesions Lymph nodes: Cervical, supraclavicular, and axillary nodes normal. Foot exam:  Nails are well trimmed,  No callouses,  Sensation intact to microfilament  Assessment and Plan:  Hypertension Well controlled on current regimen. Renal function normal but trending up as is potassium.  Will repeat in one week  Lab Results  Component Value Date   CREATININE 1.5 01/30/2014   Lab Results  Component Value Date   NA 140 01/30/2014   K 5.0 01/30/2014   CL 104 01/30/2014    CO2 28 01/30/2014     Diabetes mellitus type 2, controlled Well-controlled without metformin and he has lost 4 lbs since stopping it. .  hemoglobin A1c still less than 7.0 . He is up-to-date on eye exams and his foot exam is normal. Slight proteinuria noted,  On ARB   Lab Results  Component Value Date   HGBA1C 6.6* 01/30/2014   Lab Results  Component Value Date   MICROALBUR 5.6* 01/30/2014         Hyperlipidemia LDL is at  goal on current medications. He has no side effects and liver enzymes are normal. No changes today.  Lab Results  Component Value Date   CHOL 123 01/30/2014   HDL 39.60 01/30/2014   LDLCALC 48 01/30/2014   TRIG 178.0* 01/30/2014   CHOLHDL 3 01/30/2014   Lab Results  Component Value Date   ALT 29 01/30/2014   AST 25 01/30/2014   ALKPHOS 47 01/30/2014   BILITOT 0.9 01/30/2014      Hypertriglyceridemia Mild, secondary to diet and lack of exercise.  Addressed   Benign prostatic hypertrophy with urinary frequency Suggested by history, with LUTs suggested as well.  Referral to Urology in process.  Lab Results  Component Value Date   PSA 1.00 07/07/2013   PSA 1.42 06/23/2012   PSA 1.07 05/21/2011    Updated Medication List Outpatient Encounter Prescriptions as of 01/30/2014  Medication Sig  . aspirin 81 MG tablet Take by mouth. Takes 2 tablets daily.  Marland Kitchen atenolol (TENORMIN) 50 MG tablet TAKE 1 TABLET BY MOUTH EVERY DAY  . fish oil-omega-3 fatty acids 1000 MG capsule Take 1 g by mouth daily.   . isosorbide mononitrate (IMDUR) 60 MG 24 hr tablet Take 60 mg by mouth daily. 60 mg tablet daily.  Marland Kitchen losartan (COZAAR) 100 MG tablet Take 1 tablet (100 mg total) by mouth daily.  . Multiple Vitamin (MULTIVITAMIN) tablet Take 1 tablet by mouth daily.    . nitroGLYCERIN (NITROSTAT) 0.4 MG SL tablet Place 1 tablet (0.4 mg total) under the tongue every 5 (five) minutes as needed.  Marland Kitchen omeprazole (PRILOSEC) 20 MG capsule TAKE 1 CAPSULE BY MOUTH EVERY MORNING AS DIRECTED   . ranolazine (RANEXA) 1000 MG SR tablet Take 1 tablet (1,000 mg total) by mouth 2 (two) times daily.  . simvastatin (ZOCOR) 20 MG tablet Take 1 tablet (20 mg total) by mouth at bedtime.  Marland Kitchen zolpidem (AMBIEN) 10 MG tablet Take 1 tablet (10 mg total) by mouth at bedtime as needed for sleep.  . [DISCONTINUED] zolpidem (AMBIEN) 10 MG tablet Take 1 tablet (10 mg total) by mouth at bedtime as needed for sleep.  . [DISCONTINUED] omeprazole (PRILOSEC) 20 MG capsule      Orders Placed This Encounter  Procedures  . Hemoglobin A1c  . Lipid panel  . Microalbumin / creatinine  urine ratio  . Comprehensive metabolic panel  . TSH  . Ambulatory referral to Urology    Return in about 3 months (around 05/02/2014) for follow up diabetes.

## 2014-01-31 DIAGNOSIS — R350 Frequency of micturition: Secondary | ICD-10-CM

## 2014-01-31 DIAGNOSIS — N401 Enlarged prostate with lower urinary tract symptoms: Secondary | ICD-10-CM | POA: Insufficient documentation

## 2014-01-31 NOTE — Assessment & Plan Note (Signed)
Mild, secondary to diet and lack of exercise.  Addressed

## 2014-01-31 NOTE — Assessment & Plan Note (Signed)
LDL is at  goal on current medications. He has no side effects and liver enzymes are normal. No changes today.  Lab Results  Component Value Date   CHOL 123 01/30/2014   HDL 39.60 01/30/2014   LDLCALC 48 01/30/2014   TRIG 178.0* 01/30/2014   CHOLHDL 3 01/30/2014   Lab Results  Component Value Date   ALT 29 01/30/2014   AST 25 01/30/2014   ALKPHOS 47 01/30/2014   BILITOT 0.9 01/30/2014

## 2014-01-31 NOTE — Assessment & Plan Note (Signed)
Well controlled on current regimen. Renal function normal but trending up as is potassium.  Will repeat in one week  Lab Results  Component Value Date   CREATININE 1.5 01/30/2014   Lab Results  Component Value Date   NA 140 01/30/2014   K 5.0 01/30/2014   CL 104 01/30/2014   CO2 28 01/30/2014

## 2014-01-31 NOTE — Assessment & Plan Note (Signed)
Well-controlled without metformin and he has lost 4 lbs since stopping it. .  hemoglobin A1c still less than 7.0 . He is up-to-date on eye exams and his foot exam is normal. Slight proteinuria noted,  On ARB   Lab Results  Component Value Date   HGBA1C 6.6* 01/30/2014   Lab Results  Component Value Date   MICROALBUR 5.6* 01/30/2014

## 2014-01-31 NOTE — Assessment & Plan Note (Signed)
Suggested by history, with LUTs suggested as well.  Referral to Urology in process.  Lab Results  Component Value Date   PSA 1.00 07/07/2013   PSA 1.42 06/23/2012   PSA 1.07 05/21/2011

## 2014-02-01 ENCOUNTER — Encounter: Payer: Self-pay | Admitting: Internal Medicine

## 2014-02-06 ENCOUNTER — Encounter: Payer: Self-pay | Admitting: Internal Medicine

## 2014-02-06 DIAGNOSIS — M5137 Other intervertebral disc degeneration, lumbosacral region: Secondary | ICD-10-CM | POA: Diagnosis not present

## 2014-02-06 DIAGNOSIS — M5430 Sciatica, unspecified side: Secondary | ICD-10-CM | POA: Diagnosis not present

## 2014-02-06 DIAGNOSIS — M999 Biomechanical lesion, unspecified: Secondary | ICD-10-CM | POA: Diagnosis not present

## 2014-02-23 ENCOUNTER — Encounter: Payer: Self-pay | Admitting: Internal Medicine

## 2014-02-27 ENCOUNTER — Encounter: Payer: Self-pay | Admitting: Cardiovascular Disease

## 2014-02-27 ENCOUNTER — Ambulatory Visit (INDEPENDENT_AMBULATORY_CARE_PROVIDER_SITE_OTHER): Payer: Medicare Other | Admitting: Cardiovascular Disease

## 2014-02-27 VITALS — BP 174/76 | HR 46 | Ht 67.0 in | Wt 197.2 lb

## 2014-02-27 DIAGNOSIS — I2510 Atherosclerotic heart disease of native coronary artery without angina pectoris: Secondary | ICD-10-CM | POA: Diagnosis not present

## 2014-02-27 DIAGNOSIS — I10 Essential (primary) hypertension: Secondary | ICD-10-CM

## 2014-02-27 DIAGNOSIS — E1169 Type 2 diabetes mellitus with other specified complication: Secondary | ICD-10-CM | POA: Diagnosis not present

## 2014-02-27 DIAGNOSIS — E785 Hyperlipidemia, unspecified: Secondary | ICD-10-CM

## 2014-02-27 DIAGNOSIS — I209 Angina pectoris, unspecified: Secondary | ICD-10-CM | POA: Diagnosis not present

## 2014-02-27 DIAGNOSIS — Z951 Presence of aortocoronary bypass graft: Secondary | ICD-10-CM | POA: Diagnosis not present

## 2014-02-27 MED ORDER — CARVEDILOL 6.25 MG PO TABS: 6.2500 mg | ORAL_TABLET | Freq: Two times a day (BID) | ORAL | Status: DC

## 2014-02-27 NOTE — Assessment & Plan Note (Signed)
He is doing well with complete resolution of angina on medical therapy. Continue current medications.

## 2014-02-27 NOTE — Assessment & Plan Note (Signed)
Blood pressure is elevated today. Given his bradycardia, I elected to switch atenolol to carvedilol 6.25 mg twice daily for better blood pressure control.

## 2014-02-27 NOTE — Assessment & Plan Note (Signed)
Lab Results  Component Value Date   CHOL 123 01/30/2014   HDL 39.60 01/30/2014   LDLCALC 48 01/30/2014   TRIG 178.0* 01/30/2014   CHOLHDL 3 01/30/2014   Continue treatment with simvastatin and fish oil.

## 2014-02-27 NOTE — Patient Instructions (Signed)
Your physician has recommended you make the following change in your medication:  Stop Atenolol  Start Carvedilol 6.25 mg twice daily   Your physician wants you to follow-up in: 6 months. You will receive a reminder letter in the mail two months in advance. If you don't receive a letter, please call our office to schedule the follow-up appointment.

## 2014-02-27 NOTE — Progress Notes (Signed)
HPI  This is a pleasant 68 year old male who is here today for a followup visit.  He has known history of coronary artery disease status post CABG in 1999. He also has known history of hypertension, hyperlipidemia and type 2 diabetes. He has done well from a cardiac standpoint up until 2 years ago when he started having mild exertional chest pain. He underwent a cardiac catheterization in 2011 which showed patent grafts. He continued to have mild exertional chest pain. A treadmill nuclear stress test in 06/2012 showed normal ejection fraction. Fixed inferior wall defect with normal wall motion and moderate reversibility in the septal wall.  He was started him on Ranexa with significant improvement in angina. He currently denies any chest pain or shortness of breath. Blood pressure is elevated today but this is unusual for him. He has chronic bradycardia but overall denies dizziness.    No Known Allergies   Current Outpatient Prescriptions on File Prior to Visit  Medication Sig Dispense Refill  . aspirin 81 MG tablet Take by mouth. Takes 2 tablets daily.      Marland Kitchen atenolol (TENORMIN) 50 MG tablet TAKE 1 TABLET BY MOUTH EVERY DAY  90 tablet  3  . isosorbide mononitrate (IMDUR) 60 MG 24 hr tablet Take 60 mg by mouth daily. 60 mg tablet daily.      Marland Kitchen losartan (COZAAR) 100 MG tablet Take 1 tablet (100 mg total) by mouth daily.  90 tablet  3  . Multiple Vitamin (MULTIVITAMIN) tablet Take 1 tablet by mouth daily.        . nitroGLYCERIN (NITROSTAT) 0.4 MG SL tablet Place 1 tablet (0.4 mg total) under the tongue every 5 (five) minutes as needed.  30 tablet  3  . simvastatin (ZOCOR) 20 MG tablet Take 1 tablet (20 mg total) by mouth at bedtime.  90 tablet  3  . zolpidem (AMBIEN) 10 MG tablet Take 1 tablet (10 mg total) by mouth at bedtime as needed for sleep.  30 tablet  3   No current facility-administered medications on file prior to visit.     Past Medical History  Diagnosis Date  . S/P CABG x  5 11-99  . Diabetes mellitus without complication   . History of cardiac catheterization 2011    [facility]  . 3-vessel coronary artery disease     s/p  5 vessel CABG  . Hypertension   . Hypertriglyceridemia   . Hyperlipidemia      Past Surgical History  Procedure Laterality Date  . Coronary artery bypass graft  03/1998    5 vessel, [facility]  . Cardiac catheterization  05-19-2010    [facility]: Patent grafts. LIMA to LAD, SVG to D1, OM1 and RPDA     Family History  Problem Relation Age of Onset  . Heart attack Mother 45  . Diabetes Mother   . Hypertension Mother   . Heart attack Father 43  . Heart disease Father      History   Social History  . Marital Status: Married    Spouse Name: N/A    Number of Children: N/A  . Years of Education: N/A   Occupational History  . Not on file.   Social History Main Topics  . Smoking status: Never Smoker   . Smokeless tobacco: Never Used  . Alcohol Use: No  . Drug Use: No  . Sexual Activity: Not on file   Other Topics Concern  . Not on file   Social  History Narrative  . No narrative on file        PHYSICAL EXAM   BP 174/76  Pulse 46  Ht 5\' 7"  (1.702 m)  Wt 197 lb 4 oz (89.472 kg)  BMI 30.89 kg/m2 Constitutional: He is oriented to person, place, and time. He appears well-developed and well-nourished. No distress.  HENT: No nasal discharge.  Head: Normocephalic and atraumatic.  Eyes: Pupils are equal and round. Right eye exhibits no discharge. Left eye exhibits no discharge.  Neck: Normal range of motion. Neck supple. No JVD present. No thyromegaly present.  Cardiovascular: Normal rate, regular rhythm, normal heart sounds and. Exam reveals no gallop and no friction rub. No murmur heard.  Pulmonary/Chest: Effort normal and breath sounds normal. No stridor. No respiratory distress. He has no wheezes. He has no rales. He exhibits no tenderness.  Abdominal: Soft. Bowel sounds are normal. He exhibits no distension. There  is no tenderness. There is no rebound and no guarding.  Musculoskeletal: Normal range of motion. He exhibits no edema and no tenderness.  Neurological: He is alert and oriented to person, place, and time. Coordination normal.  Skin: Skin is warm and dry. No rash noted. He is not diaphoretic. No erythema. No pallor.  Psychiatric: He has a normal mood and affect. His behavior is normal. Judgment and thought content normal.       EKG: Marked sinus  Bradycardia  BORDERLINE RHYTHM    ASSESSMENT AND PLAN

## 2014-03-06 DIAGNOSIS — M5137 Other intervertebral disc degeneration, lumbosacral region: Secondary | ICD-10-CM | POA: Diagnosis not present

## 2014-03-06 DIAGNOSIS — M5430 Sciatica, unspecified side: Secondary | ICD-10-CM | POA: Diagnosis not present

## 2014-03-06 DIAGNOSIS — M999 Biomechanical lesion, unspecified: Secondary | ICD-10-CM | POA: Diagnosis not present

## 2014-03-08 DIAGNOSIS — E291 Testicular hypofunction: Secondary | ICD-10-CM | POA: Diagnosis not present

## 2014-03-08 DIAGNOSIS — N400 Enlarged prostate without lower urinary tract symptoms: Secondary | ICD-10-CM | POA: Diagnosis not present

## 2014-03-08 DIAGNOSIS — R3913 Splitting of urinary stream: Secondary | ICD-10-CM | POA: Diagnosis not present

## 2014-03-08 DIAGNOSIS — N4889 Other specified disorders of penis: Secondary | ICD-10-CM | POA: Diagnosis not present

## 2014-03-08 DIAGNOSIS — E663 Overweight: Secondary | ICD-10-CM | POA: Diagnosis not present

## 2014-03-09 ENCOUNTER — Telehealth: Payer: Self-pay

## 2014-03-09 NOTE — Telephone Encounter (Signed)
Received fax regarding clearance.  Placed in Christell Faith, PA's folder for review.

## 2014-03-09 NOTE — Telephone Encounter (Signed)
Nurse with [facility] needs surgical clearance for penile bx. Will also fax requests

## 2014-03-15 ENCOUNTER — Ambulatory Visit: Payer: Self-pay | Admitting: Urology

## 2014-03-15 DIAGNOSIS — E669 Obesity, unspecified: Secondary | ICD-10-CM | POA: Diagnosis not present

## 2014-03-15 DIAGNOSIS — Z01812 Encounter for preprocedural laboratory examination: Secondary | ICD-10-CM | POA: Diagnosis not present

## 2014-03-15 DIAGNOSIS — N401 Enlarged prostate with lower urinary tract symptoms: Secondary | ICD-10-CM | POA: Diagnosis not present

## 2014-03-15 DIAGNOSIS — E785 Hyperlipidemia, unspecified: Secondary | ICD-10-CM | POA: Diagnosis not present

## 2014-03-15 DIAGNOSIS — E119 Type 2 diabetes mellitus without complications: Secondary | ICD-10-CM | POA: Diagnosis not present

## 2014-03-15 DIAGNOSIS — N4889 Other specified disorders of penis: Secondary | ICD-10-CM | POA: Diagnosis not present

## 2014-03-15 DIAGNOSIS — I10 Essential (primary) hypertension: Secondary | ICD-10-CM | POA: Diagnosis not present

## 2014-03-15 LAB — URINALYSIS, COMPLETE
BACTERIA: NONE SEEN
BILIRUBIN, UR: NEGATIVE
BLOOD: NEGATIVE
Glucose,UR: NEGATIVE mg/dL (ref 0–75)
Ketone: NEGATIVE
Leukocyte Esterase: NEGATIVE
NITRITE: NEGATIVE
PH: 6 (ref 4.5–8.0)
Protein: NEGATIVE
Specific Gravity: 1.009 (ref 1.003–1.030)
Squamous Epithelial: <1
WBC UR: 1 /HPF (ref 0–5)

## 2014-03-15 LAB — BASIC METABOLIC PANEL
Anion Gap: 0 — ABNORMAL LOW (ref 7–16)
BUN: 12 mg/dL (ref 7–18)
CO2: 30 mmol/L (ref 21–32)
Calcium, Total: 9.1 mg/dL (ref 8.5–10.1)
Chloride: 108 mmol/L — ABNORMAL HIGH (ref 98–107)
Creatinine: 1.38 mg/dL — ABNORMAL HIGH (ref 0.60–1.30)
EGFR (African American): >60
EGFR (Non-African Amer.): 54 — ABNORMAL LOW
GLUCOSE: 110 mg/dL — AB (ref 65–99)
Osmolality: 276 (ref 275–301)
Potassium: 4.4 mmol/L (ref 3.5–5.1)
SODIUM: 138 mmol/L (ref 136–145)

## 2014-03-16 LAB — URINE CULTURE

## 2014-03-20 ENCOUNTER — Ambulatory Visit: Payer: Self-pay | Admitting: Urology

## 2014-03-20 DIAGNOSIS — N4889 Other specified disorders of penis: Secondary | ICD-10-CM | POA: Diagnosis not present

## 2014-03-20 DIAGNOSIS — I10 Essential (primary) hypertension: Secondary | ICD-10-CM | POA: Diagnosis not present

## 2014-03-20 DIAGNOSIS — L900 Lichen sclerosus et atrophicus: Secondary | ICD-10-CM | POA: Diagnosis not present

## 2014-03-20 DIAGNOSIS — N359 Urethral stricture, unspecified: Secondary | ICD-10-CM | POA: Diagnosis not present

## 2014-03-20 DIAGNOSIS — G2581 Restless legs syndrome: Secondary | ICD-10-CM | POA: Diagnosis not present

## 2014-03-20 DIAGNOSIS — E119 Type 2 diabetes mellitus without complications: Secondary | ICD-10-CM | POA: Diagnosis not present

## 2014-03-20 DIAGNOSIS — N480 Leukoplakia of penis: Secondary | ICD-10-CM | POA: Diagnosis not present

## 2014-03-20 DIAGNOSIS — R3913 Splitting of urinary stream: Secondary | ICD-10-CM | POA: Diagnosis not present

## 2014-03-20 DIAGNOSIS — I2510 Atherosclerotic heart disease of native coronary artery without angina pectoris: Secondary | ICD-10-CM | POA: Diagnosis not present

## 2014-03-20 DIAGNOSIS — N509 Disorder of male genital organs, unspecified: Secondary | ICD-10-CM | POA: Diagnosis not present

## 2014-03-20 DIAGNOSIS — K219 Gastro-esophageal reflux disease without esophagitis: Secondary | ICD-10-CM | POA: Diagnosis not present

## 2014-03-21 ENCOUNTER — Other Ambulatory Visit: Payer: Self-pay | Admitting: Cardiovascular Disease

## 2014-03-22 LAB — PATHOLOGY REPORT

## 2014-03-24 ENCOUNTER — Other Ambulatory Visit: Payer: Self-pay

## 2014-03-24 NOTE — Telephone Encounter (Signed)
Cardiac clearance faxed with notes

## 2014-03-28 DIAGNOSIS — R3913 Splitting of urinary stream: Secondary | ICD-10-CM | POA: Diagnosis not present

## 2014-03-28 DIAGNOSIS — N480 Leukoplakia of penis: Secondary | ICD-10-CM | POA: Diagnosis not present

## 2014-04-18 ENCOUNTER — Other Ambulatory Visit: Payer: Self-pay | Admitting: Cardiovascular Disease

## 2014-04-26 ENCOUNTER — Telehealth: Payer: Self-pay | Admitting: *Deleted

## 2014-04-26 DIAGNOSIS — E781 Pure hyperglyceridemia: Secondary | ICD-10-CM

## 2014-04-26 DIAGNOSIS — I2510 Atherosclerotic heart disease of native coronary artery without angina pectoris: Secondary | ICD-10-CM

## 2014-04-26 DIAGNOSIS — E119 Type 2 diabetes mellitus without complications: Secondary | ICD-10-CM

## 2014-04-26 NOTE — Telephone Encounter (Signed)
Pt is coming in Friday what labs and dx?

## 2014-04-27 NOTE — Addendum Note (Signed)
Addended by: [name] on: 04/27/2014 06:58 AM   Modules accepted: Orders

## 2014-04-28 ENCOUNTER — Other Ambulatory Visit (INDEPENDENT_AMBULATORY_CARE_PROVIDER_SITE_OTHER): Payer: Medicare Other

## 2014-04-28 DIAGNOSIS — E119 Type 2 diabetes mellitus without complications: Secondary | ICD-10-CM

## 2014-04-28 DIAGNOSIS — E781 Pure hyperglyceridemia: Secondary | ICD-10-CM | POA: Diagnosis not present

## 2014-04-28 LAB — COMPREHENSIVE METABOLIC PANEL
ALBUMIN: 4.4 g/dL (ref 3.5–5.2)
ALT: 26 U/L (ref 0–53)
AST: 21 U/L (ref 0–37)
Alkaline Phosphatase: 54 U/L (ref 39–117)
BUN: 21 mg/dL (ref 6–23)
CHLORIDE: 106 meq/L (ref 96–112)
CO2: 26 meq/L (ref 19–32)
CREATININE: 1.9 mg/dL — AB (ref 0.4–1.5)
Calcium: 9.3 mg/dL (ref 8.4–10.5)
GFR: 37.29 mL/min — AB (ref >60.00)
GLUCOSE: 142 mg/dL — AB (ref 70–99)
POTASSIUM: 4.9 meq/L (ref 3.5–5.1)
Sodium: 139 mEq/L (ref 135–145)
Total Bilirubin: 1 mg/dL (ref 0.2–1.2)
Total Protein: 6.7 g/dL (ref 6.0–8.3)

## 2014-04-28 LAB — LIPID PANEL
Cholesterol: 130 mg/dL (ref 0–200)
HDL: 43.1 mg/dL (ref >39.00)
LDL Cholesterol: 57 mg/dL (ref 0–99)
NonHDL: 86.9
Total CHOL/HDL Ratio: 3
Triglycerides: 151 mg/dL — ABNORMAL HIGH (ref 0.0–149.0)
VLDL: 30.2 mg/dL (ref 0.0–40.0)

## 2014-04-28 LAB — HEMOGLOBIN A1C: HEMOGLOBIN A1C: 6.4 % (ref 4.6–6.5)

## 2014-04-28 NOTE — Addendum Note (Signed)
Addended by: Johnsie Cancel on: 04/28/2014 08:08 AM   Modules accepted: Orders

## 2014-04-30 ENCOUNTER — Encounter: Payer: Self-pay | Admitting: Internal Medicine

## 2014-05-01 ENCOUNTER — Ambulatory Visit (INDEPENDENT_AMBULATORY_CARE_PROVIDER_SITE_OTHER): Payer: Medicare Other | Admitting: Internal Medicine

## 2014-05-01 ENCOUNTER — Telehealth: Payer: Self-pay | Admitting: *Deleted

## 2014-05-01 ENCOUNTER — Other Ambulatory Visit: Payer: Self-pay | Admitting: Internal Medicine

## 2014-05-01 ENCOUNTER — Encounter: Payer: Self-pay | Admitting: Internal Medicine

## 2014-05-01 VITALS — BP 114/56 | HR 58 | Temp 98.1°F | Resp 14 | Ht 67.0 in | Wt 189.0 lb

## 2014-05-01 DIAGNOSIS — E785 Hyperlipidemia, unspecified: Secondary | ICD-10-CM | POA: Diagnosis not present

## 2014-05-01 DIAGNOSIS — I10 Essential (primary) hypertension: Secondary | ICD-10-CM | POA: Diagnosis not present

## 2014-05-01 DIAGNOSIS — E1159 Type 2 diabetes mellitus with other circulatory complications: Secondary | ICD-10-CM

## 2014-05-01 DIAGNOSIS — N179 Acute kidney failure, unspecified: Secondary | ICD-10-CM

## 2014-05-01 DIAGNOSIS — I209 Angina pectoris, unspecified: Secondary | ICD-10-CM

## 2014-05-01 LAB — HM DIABETES FOOT EXAM: HM Diabetic Foot Exam: NORMAL

## 2014-05-01 MED ORDER — NITROGLYCERIN 0.4 MG SL SUBL: 0.4000 mg | SUBLINGUAL_TABLET | SUBLINGUAL | Status: DC | PRN

## 2014-05-01 MED ORDER — LOSARTAN POTASSIUM 50 MG PO TABS: 50.0000 mg | ORAL_TABLET | Freq: Every day | ORAL | Status: DC

## 2014-05-01 NOTE — Progress Notes (Signed)
Patient Active Problem List   Diagnosis Date Noted  . Acute renal failure 05/02/2014  . Benign prostatic hypertrophy with urinary frequency 01/31/2014  . Encounter for preventive health examination 07/02/2013  . Change in bowel habits 07/02/2013  . Obesity, unspecified 04/03/2013  . Other malaise and fatigue 09/21/2012  . Hyperlipidemia   . Hypertension   . 3-vessel coronary artery disease   . S/P CABG x 5   . Hypertriglyceridemia   . Diabetes mellitus type 2, controlled 05/21/2011  . 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 Follow-up    3 month followup    HPI:   Johnathan Arnold is a 68 y.o. male who presents for  He has been having mild symptoms of orthostasis for the past several weeks,  Denies vertigo or presyncope,  Still exercising regularly at his gym walking on the treadmill and using a recumbent  Bicycle.  No chest pain unless he really pushes himself.  He has been taking flomax for about 2 months started by Dr Erlene Quan at [place] for BPH symptoms.       Past Medical History  Diagnosis Date  . 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 Diabetes mellitus without complication   . History of cardiac catheterization 2011    [facility]  . 3-vessel coronary artery disease     s/p  5 vessel CABG  . Hypertension   . Hypertriglyceridemia   . Hyperlipidemia     Past Surgical History  Procedure Laterality Date  . Coronary artery bypass graft  03/1998    5 vessel, [facility]  . Cardiac catheterization  05-19-2010    [facility]: Patent grafts. LIMA to LAD, SVG to D1, OM1 and RPDA       The following portions of the patient's history were reviewed and updated as appropriate: Allergies, current medications, and problem list.    Review of Systems:   Patient denies headache, fevers, malaise, unintentional weight loss, skin rash, eye pain, sinus congestion and sinus pain, sore throat,  dysphagia,  hemoptysis , cough, dyspnea, wheezing, chest pain, palpitations, orthopnea, edema, abdominal pain, nausea, melena, diarrhea, constipation, flank pain, dysuria, hematuria, urinary  Frequency, nocturia, numbness, tingling, seizures,  Focal weakness, Loss of consciousness,  Tremor, insomnia, depression, anxiety, and suicidal ideation.     History   Social History  . Marital Status: Married    Spouse Name: N/A    Number of Children: N/A  . Years of Education: N/A   Occupational History  . Not on file.   Social History Main Topics  . Smoking status: Never Smoker   . Smokeless tobacco: Never Used  . Alcohol Use: No  . Drug Use: No  . Sexual Activity: Not on file   Other Topics Concern  . Not on file   Social History Narrative    Objective:  Filed Vitals:   05/01/14 0818  BP: 114/56  Pulse: 58  Temp: 98.1 F (36.7 C)  Resp: 14     General appearance: alert, cooperative and appears stated age Ears: normal TM's and external ear canals both ears Throat: lips, mucosa, and tongue normal; teeth and gums normal Neck: no adenopathy, no carotid bruit, supple, symmetrical, trachea midline and thyroid not enlarged, symmetric, no tenderness/mass/nodules Back: symmetric, no curvature. ROM normal. No CVA tenderness. Lungs: clear to auscultation bilaterally Heart: regular rate and rhythm, S1, S2 normal, no murmur, click,  rub or gallop Abdomen: soft, non-tender; bowel sounds normal; no masses,  no organomegaly Pulses: 2+ and symmetric Skin: Skin color, texture, turgor normal. No rashes or lesions Lymph nodes: Cervical, supraclavicular, and axillary nodes normal.  Assessment and Plan:  Hypertension Recent change to carvedilol by Dr Fletcher Anon and addition of flomax by urology,  Given his symptoms of orthostasis,  Will reduce losartan to 50 mg and have him follow BPs for elevation to over 130/80.   Acute renal failure May be due to dehydrated state as patient was fasting and  had exercised prior to labs.  return this week for repeat BMET when well hydrated.  If Cr is still elevated consider suspending losartan  Hyperlipidemia LDL is at  goal on current medications. He has no side effects and liver enzymes are normal. No changes today.  Lab Results  Component Value Date   CHOL 130 04/28/2014   HDL 43.10 04/28/2014   LDLCALC 57 04/28/2014   TRIG 151.0* 04/28/2014   CHOLHDL 3 04/28/2014   Lab Results  Component Value Date   ALT 26 04/28/2014   AST 21 04/28/2014   ALKPHOS 54 04/28/2014   BILITOT 1.0 04/28/2014         Updated Medication List Outpatient Encounter Prescriptions as of 05/01/2014  Medication Sig  . aspirin 81 MG tablet Take by mouth. Takes 2 tablets daily.  . carvedilol (COREG) 6.25 MG tablet Take 1 tablet (6.25 mg total) by mouth 2 (two) times daily.  . isosorbide mononitrate (IMDUR) 60 MG 24 hr tablet Take 60 mg by mouth daily. 60 mg tablet daily.  Marland Kitchen losartan (COZAAR) 50 MG tablet Take 1 tablet (50 mg total) by mouth daily.  . Multiple Vitamin (MULTIVITAMIN) tablet Take 1 tablet by mouth daily.    . nitroGLYCERIN (NITROSTAT) 0.4 MG SL tablet Place 1 tablet (0.4 mg total) under the tongue every 5 (five) minutes as needed.  . Omega-3 Fatty Acids (FISH OIL) 1200 MG CAPS Take by mouth daily.  Marland Kitchen omeprazole (PRILOSEC) 20 MG capsule TAKE 1 CAPSULE BY MOUTH EVERY OTHER MORNING AS DIRECTED  . ranolazine (RANEXA) 1000 MG SR tablet Take 1,000 mg by mouth daily.  . simvastatin (ZOCOR) 20 MG tablet TAKE 1 TABLET BY MOUTH EVERY NIGHT AT BEDTIME  . tamsulosin (FLOMAX) 0.4 MG CAPS capsule Take 0.4 mg by mouth.  . zolpidem (AMBIEN) 10 MG tablet Take 1 tablet (10 mg total) by mouth at bedtime as needed for sleep.  . [DISCONTINUED] losartan (COZAAR) 100 MG tablet Take 1 tablet (100 mg total) by mouth daily.  . [DISCONTINUED] nitroGLYCERIN (NITROSTAT) 0.4 MG SL tablet Place 1 tablet (0.4 mg total) under the tongue every 5 (five) minutes as needed.  .  [DISCONTINUED] isosorbide mononitrate (IMDUR) 60 MG 24 hr tablet TAKE 1 TABLET BY MOUTH EVERY DAY. ALSO TAKE ONE 30MG  TABLET EVERY DAY     No orders of the defined types were placed in this encounter.    No Follow-up on file.

## 2014-05-01 NOTE — Telephone Encounter (Signed)
What labs and dx?  

## 2014-05-01 NOTE — Progress Notes (Signed)
Pre visit review using our clinic review tool, if applicable. No additional management support is needed unless otherwise documented below in the visit note. 

## 2014-05-01 NOTE — Patient Instructions (Addendum)
We are going to reduce the losartan to 50 mg daily   CALL IF SUBSEQUENT BP READINGS ARE OVER 130/80  Your Flomax may be causing your dizzy symptoms , so we may end up changing your prostate medication   Please retur when you are well hydrated so we can repeat your kidney function test

## 2014-05-01 NOTE — Addendum Note (Signed)
Addended by: [name] on: 05/01/2014 03:25 PM   Modules accepted: Orders

## 2014-05-02 ENCOUNTER — Other Ambulatory Visit (INDEPENDENT_AMBULATORY_CARE_PROVIDER_SITE_OTHER): Payer: Medicare Other

## 2014-05-02 DIAGNOSIS — N179 Acute kidney failure, unspecified: Secondary | ICD-10-CM

## 2014-05-02 LAB — BASIC METABOLIC PANEL
BUN: 19 mg/dL (ref 6–23)
CALCIUM: 9.3 mg/dL (ref 8.4–10.5)
CO2: 25 mEq/L (ref 19–32)
Chloride: 104 mEq/L (ref 96–112)
Creatinine, Ser: 1.6 mg/dL — ABNORMAL HIGH (ref 0.4–1.5)
GFR: 46.75 mL/min — AB (ref >60.00)
GLUCOSE: 117 mg/dL — AB (ref 70–99)
Potassium: 4.7 mEq/L (ref 3.5–5.1)
SODIUM: 138 meq/L (ref 135–145)

## 2014-05-02 NOTE — Assessment & Plan Note (Addendum)
Recent change to carvedilol by Dr Fletcher Anon and addition of flomax by urology,  Given his symptoms of orthostasis,  Will reduce losartan to 50 mg and have him follow BPs for elevation to over 130/80.

## 2014-05-02 NOTE — Assessment & Plan Note (Signed)
LDL is at  goal on current medications. He has no side effects and liver enzymes are normal. No changes today.  Lab Results  Component Value Date   CHOL 130 04/28/2014   HDL 43.10 04/28/2014   LDLCALC 57 04/28/2014   TRIG 151.0* 04/28/2014   CHOLHDL 3 04/28/2014   Lab Results  Component Value Date   ALT 26 04/28/2014   AST 21 04/28/2014   ALKPHOS 54 04/28/2014   BILITOT 1.0 04/28/2014

## 2014-05-02 NOTE — Assessment & Plan Note (Signed)
May be due to dehydrated state as patient was fasting and had exercised prior to labs.  return this week for repeat BMET when well hydrated.  If Cr is still elevated consider suspending losartan

## 2014-05-05 ENCOUNTER — Telehealth: Payer: Self-pay | Admitting: Internal Medicine

## 2014-05-05 DIAGNOSIS — N183 Chronic kidney disease, stage 3 unspecified: Secondary | ICD-10-CM

## 2014-05-05 NOTE — Telephone Encounter (Signed)
Your repeat renal function did improve with hydration but is not back to baseline.  I would like to have you see a nephrologist for further evaluation .  We will set that up for you.

## 2014-05-08 DIAGNOSIS — E119 Type 2 diabetes mellitus without complications: Secondary | ICD-10-CM | POA: Diagnosis not present

## 2014-05-08 LAB — HM DIABETES EYE EXAM

## 2014-05-11 ENCOUNTER — Encounter: Payer: Self-pay | Admitting: *Deleted

## 2014-05-17 ENCOUNTER — Encounter: Payer: Self-pay | Admitting: Internal Medicine

## 2014-05-26 ENCOUNTER — Encounter: Payer: Self-pay | Admitting: Internal Medicine

## 2014-06-14 ENCOUNTER — Encounter: Payer: Self-pay | Admitting: Internal Medicine

## 2014-06-21 DIAGNOSIS — I129 Hypertensive chronic kidney disease with stage 1 through stage 4 chronic kidney disease, or unspecified chronic kidney disease: Secondary | ICD-10-CM | POA: Diagnosis not present

## 2014-06-21 DIAGNOSIS — E1122 Type 2 diabetes mellitus with diabetic chronic kidney disease: Secondary | ICD-10-CM | POA: Diagnosis not present

## 2014-06-21 DIAGNOSIS — N183 Chronic kidney disease, stage 3 (moderate): Secondary | ICD-10-CM | POA: Diagnosis not present

## 2014-06-30 ENCOUNTER — Encounter: Payer: Self-pay | Admitting: Internal Medicine

## 2014-07-17 ENCOUNTER — Encounter: Payer: Self-pay | Admitting: Internal Medicine

## 2014-07-17 ENCOUNTER — Ambulatory Visit (INDEPENDENT_AMBULATORY_CARE_PROVIDER_SITE_OTHER): Payer: Medicare Other | Admitting: Internal Medicine

## 2014-07-17 VITALS — BP 108/60 | HR 56 | Temp 97.6°F | Resp 14 | Ht 67.0 in | Wt 198.5 lb

## 2014-07-17 DIAGNOSIS — E119 Type 2 diabetes mellitus without complications: Secondary | ICD-10-CM | POA: Diagnosis not present

## 2014-07-17 DIAGNOSIS — Z0000 Encounter for general adult medical examination without abnormal findings: Secondary | ICD-10-CM

## 2014-07-17 DIAGNOSIS — E669 Obesity, unspecified: Secondary | ICD-10-CM

## 2014-07-17 DIAGNOSIS — Z125 Encounter for screening for malignant neoplasm of prostate: Secondary | ICD-10-CM | POA: Diagnosis not present

## 2014-07-17 NOTE — Progress Notes (Signed)
Pre-visit discussion using our clinic review tool. No additional management support is needed unless otherwise documented below in the visit note.  

## 2014-07-17 NOTE — Patient Instructions (Addendum)
Return for fasting labs after Feb 20th  Then we will repeat them in late May/early June and have your next office visit after that   Health Maintenance A healthy lifestyle and preventative care can promote health and wellness.  Maintain regular health, dental, and eye exams.  Eat a healthy diet. Foods like vegetables, fruits, whole grains, low-fat dairy products, and lean protein foods contain the nutrients you need and are low in calories. Decrease your intake of foods high in solid fats, added sugars, and salt. Get information about a proper diet from your health care provider, if necessary.  Regular physical exercise is one of the most important things you can do for your health. Most adults should get at least 150 minutes of moderate-intensity exercise (any activity that increases your heart rate and causes you to sweat) each week. In addition, most adults need muscle-strengthening exercises on 2 or more days a week.   Maintain a healthy weight. The body mass index (BMI) is a screening tool to identify possible weight problems. It provides an estimate of body fat based on height and weight. Your health care provider can find your BMI and can help you achieve or maintain a healthy weight. For males 20 years and older:  A BMI below 18.5 is considered underweight.  A BMI of 18.5 to 24.9 is normal.  A BMI of 25 to 29.9 is considered overweight.  A BMI of 30 and above is considered obese.  Maintain normal blood lipids and cholesterol by exercising and minimizing your intake of saturated fat. Eat a balanced diet with plenty of fruits and vegetables. Blood tests for lipids and cholesterol should begin at age 34 and be repeated every 5 years. If your lipid or cholesterol levels are high, you are over age 7, or you are at high risk for heart disease, you may need your cholesterol levels checked more frequently.Ongoing high lipid and cholesterol levels should be treated with medicines if diet and  exercise are not working.  If you smoke, find out from your health care provider how to quit. If you do not use tobacco, do not start.  Lung cancer screening is recommended for adults aged 36-80 years who are at high risk for developing lung cancer because of a history of smoking. A yearly low-dose CT scan of the lungs is recommended for people who have at least a 30-pack-year history of smoking and are current smokers or have quit within the past 15 years. A pack year of smoking is smoking an average of 1 pack of cigarettes a day for 1 year (for example, a 30-pack-year history of smoking could mean smoking 1 pack a day for 30 years or 2 packs a day for 15 years). Yearly screening should continue until the smoker has stopped smoking for at least 15 years. Yearly screening should be stopped for people who develop a health problem that would prevent them from having lung cancer treatment.  If you choose to drink alcohol, do not have more than 2 drinks per day. One drink is considered to be 12 oz (360 mL) of beer, 5 oz (150 mL) of wine, or 1.5 oz (45 mL) of liquor.  Avoid the use of street drugs. Do not share needles with anyone. Ask for help if you need support or instructions about stopping the use of drugs.  High blood pressure causes heart disease and increases the risk of stroke. Blood pressure should be checked at least every 1-2 years. Ongoing high blood  pressure should be treated with medicines if weight loss and exercise are not effective.  If you are 57-60 years old, ask your health care provider if you should take aspirin to prevent heart disease.  Diabetes screening involves taking a blood sample to check your fasting blood sugar level. This should be done once every 3 years after age 33 if you are at a normal weight and without risk factors for diabetes. Testing should be considered at a younger age or be carried out more frequently if you are overweight and have at least 1 risk factor for  diabetes.  Colorectal cancer can be detected and often prevented. Most routine colorectal cancer screening begins at the age of 69 and continues through age 58. However, your health care provider may recommend screening at an earlier age if you have risk factors for colon cancer. On a yearly basis, your health care provider may provide home test kits to check for hidden blood in the stool. A small camera at the end of a tube may be used to directly examine the colon (sigmoidoscopy or colonoscopy) to detect the earliest forms of colorectal cancer. Talk to your health care provider about this at age 29 when routine screening begins. A direct exam of the colon should be repeated every 5-10 years through age 94, unless early forms of precancerous polyps or small growths are found.  People who are at an increased risk for hepatitis B should be screened for this virus. You are considered at high risk for hepatitis B if:  You were born in a country where hepatitis B occurs often. Talk with your health care provider about which countries are considered high risk.  Your parents were born in a high-risk country and you have not received a shot to protect against hepatitis B (hepatitis B vaccine).  You have HIV or AIDS.  You use needles to inject street drugs.  You live with, or have sex with, someone who has hepatitis B.  You are a man who has sex with other men (MSM).  You get hemodialysis treatment.  You take certain medicines for conditions like cancer, organ transplantation, and autoimmune conditions.  Hepatitis C blood testing is recommended for all people born from [birth year] through 1966 and any individual with known risk factors for hepatitis C.  Healthy men should no longer receive prostate-specific antigen (PSA) blood tests as part of routine cancer screening. Talk to your health care provider about prostate cancer screening.  Testicular cancer screening is not recommended for adolescents or  adult males who have no symptoms. Screening includes self-exam, a health care provider exam, and other screening tests. Consult with your health care provider about any symptoms you have or any concerns you have about testicular cancer.  Practice safe sex. Use condoms and avoid high-risk sexual practices to reduce the spread of sexually transmitted infections (STIs).  You should be screened for STIs, including gonorrhea and chlamydia if:  You are sexually active and are younger than 24 years.  You are older than 24 years, and your health care provider tells you that you are at risk for this type of infection.  Your sexual activity has changed since you were last screened, and you are at an increased risk for chlamydia or gonorrhea. Ask your health care provider if you are at risk.  If you are at risk of being infected with HIV, it is recommended that you take a prescription medicine daily to prevent HIV infection. This is called pre-exposure  prophylaxis (PrEP). You are considered at risk if:  You are a man who has sex with other men (MSM).  You are a heterosexual man who is sexually active with multiple partners.  You take drugs by injection.  You are sexually active with a partner who has HIV.  Talk with your health care provider about whether you are at high risk of being infected with HIV. If you choose to begin PrEP, you should first be tested for HIV. You should then be tested every 3 months for as long as you are taking PrEP.  Use sunscreen. Apply sunscreen liberally and repeatedly throughout the day. You should seek shade when your shadow is shorter than you. Protect yourself by wearing long sleeves, pants, a wide-brimmed hat, and sunglasses year round whenever you are outdoors.  Tell your health care provider of new moles or changes in moles, especially if there is a change in shape or color. Also, tell your health care provider if a mole is larger than the size of a pencil  eraser.  A one-time screening for abdominal aortic aneurysm (AAA) and surgical repair of large AAAs by ultrasound is recommended for men aged 1-75 years who are current or former smokers.  Stay current with your vaccines (immunizations). Document Released: 11/22/2007 Document Revised: 05/31/2013 Document Reviewed: 10/21/2010 [facility] Patient Information 2015 [place], [place]. This information is not intended to replace advice given to you by your health care provider. Make sure you discuss any questions you have with your health care provider.

## 2014-07-17 NOTE — Assessment & Plan Note (Signed)

## 2014-07-17 NOTE — Progress Notes (Signed)
Patient ID: Johnathan Arnold, male   DOB: 07-30-1945, 69 y.o.   MRN: [id number]   The patient is here for annual Medicare wellness examination and management of other chronic and acute problems.  Starting to take miralax daily for constipaiton,  Has gained 10 lbssinc e2013.  usd to be metformin  Works out 3 to 5 days at [employer] using treadmill, ellipitc and nNustep.  walkens 2,3 miles /hour on the tredmil..    The risk factors are reflected in the social history.  The roster of all physicians providing medical care to patient - is listed in the Snapshot section of the chart.  Activities of daily living:  The patient is 100% independent in all ADLs: dressing, toileting, feeding as well as independent mobility  Home safety : The patient has smoke detectors in the home. They wear seatbelts.  There are no firearms at home. There is no violence in the home.   There is no risks for hepatitis, STDs or HIV. There is no   history of blood transfusion. They have no travel history to infectious disease endemic areas of the world.  The patient has seen their dentist in the last six month. They have seen their eye doctor in the last year. They admit to slight hearing difficulty with regard to whispered voices and some television programs.  They have deferred audiologic testing in the last year.  They do not  have excessive sun exposure. Discussed the need for sun protection: hats, long sleeves and use of sunscreen if there is significant sun exposure.   Diet: the importance of a healthy diet is discussed. They do have a healthy diet.  The benefits of regular aerobic exercise were discussed. She walks 4 times per week ,  20 minutes.   Depression screen: there are no signs or vegative symptoms of depression- irritability, change in appetite, anhedonia, sadness/tearfullness.  Cognitive assessment: the patient manages all their financial and personal affairs and is actively engaged. They could relate  day,date,year and events; recalled 2/3 objects at 3 minutes; performed clock-face test normally.  The following portions of the patient's history were reviewed and updated as appropriate: allergies, current medications, past family history, past medical history,  past surgical history, past social history  and problem list.  Visual acuity was not assessed per patient preference since she has regular follow up with her ophthalmologist. Hearing and body mass index were assessed and reviewed.   During the course of the visit the patient was educated and counseled about appropriate screening and preventive services including : fall prevention , diabetes screening, nutrition counseling, colorectal cancer screening, and recommended immunizations.    Review of Systems  Patient denies headache, fevers, malaise, unintentional weight loss, skin rash, eye pain, sinus congestion and sinus pain, sore throat, dysphagia,  hemoptysis , cough, dyspnea, wheezing, chest pain, palpitations, orthopnea, edema, abdominal pain, nausea, melena, diarrhea, constipation, flank pain, dysuria, hematuria, urinary  Frequency, nocturia, numbness, tingling, seizures,  Focal weakness, Loss of consciousness,  Tremor, insomnia, depression, anxiety, and suicidal ideation.     Objective:  BP 108/60 mmHg  Pulse 56  Temp(Src) 97.6 F (36.4 C) (Oral)  Resp 14  Ht 5\' 7"  (1.702 m)  Wt 198 lb 8 oz (90.039 kg)  BMI 31.08 kg/m2  SpO2 94%  General Appearance:    Alert, cooperative, no distress, appears stated age  Head:    Normocephalic, without obvious abnormality, atraumatic  Eyes:    PERRL, conjunctiva/corneas clear, EOM's intact, fundi  benign, both eyes       Ears:    Normal TM's and external ear canals, both ears  Nose:   Nares normal, septum midline, mucosa normal, no drainage   or sinus tenderness  Throat:   Lips, mucosa, and tongue normal; teeth and gums normal  Neck:   Supple, symmetrical, trachea midline, no  adenopathy;       thyroid:  No enlargement/tenderness/nodules; no carotid   bruit or JVD  Back:     Symmetric, no curvature, ROM normal, no CVA tenderness  Lungs:     Clear to auscultation bilaterally, respirations unlabored  Chest wall:    No tenderness or deformity  Heart:    Regular rate and rhythm, S1 and S2 normal, no murmur, rub   or gallop  Abdomen:     Soft, non-tender, bowel sounds active all four quadrants,    no masses, no organomegaly  Genitalia:    Normal male without lesion, discharge or tenderness  Rectal:    Normal tone, normal prostate, no masses or tenderness;   guaiac negative stool  Extremities:   Extremities normal, atraumatic, no cyanosis or edema  Pulses:   2+ and symmetric all extremities  Skin:   Skin color, texture, turgor normal, no rashes or lesions  Lymph nodes:   Cervical, supraclavicular, and axillary nodes normal  Neurologic:   CNII-XII intact. Normal strength, sensation and reflexes      throughout    Assessment and Plan:  Problem List Items Addressed This Visit    Obesity    Body mass index is 31.08 kg/(m^2). HisBMI remains unchanged despite exercise. We discussed the nature and quality of his exercises well as of his diet. Usually what I find is that people are not exercising as vigorously as they should to achieve a sustained heart rate in the aerobic zone.  I also am advising him to get back on the low GI diet using six smaller meals a day to stimulate his  metabolism.          Encounter for preventive health examination    Annual Medicare wellness  exam was done as well as a comprehensive physical exam and management of acute and chronic conditions .  During the course of the visit the patient was educated and counseled about appropriate screening and preventive services including : fall prevention , diabetes screening, nutrition counseling, colorectal cancer screening, and recommended immunizations.  Printed recommendations for health  maintenance screenings was given.       Diabetes mellitus type 2, controlled    managd with diet alone .  Up to date on eye and foot exams.  Lab Results  Component Value Date   HGBA1C 6.4 04/28/2014   Lab Results  Component Value Date   MICROALBUR 5.6* 01/30/2014         Relevant Orders   Hemoglobin A1c   Lipid panel   Microalbumin / creatinine urine ratio   Comprehensive metabolic panel    Other Visit Diagnoses    Prostate cancer screening    -  Primary    Relevant Orders    PSA, Medicare

## 2014-07-17 NOTE — Assessment & Plan Note (Signed)
Body mass index is 31.08 kg/(m^2). HisBMI remains unchanged despite exercise. We discussed the nature and quality of his exercises well as of his diet. Usually what I find is that people are not exercising as vigorously as they should to achieve a sustained heart rate in the aerobic zone.  I also am advising him to get back on the low GI diet using six smaller meals a day to stimulate his  metabolism.

## 2014-07-17 NOTE — Assessment & Plan Note (Signed)
managd with diet alone .  Up to date on eye and foot exams.  Lab Results  Component Value Date   HGBA1C 6.4 04/28/2014   Lab Results  Component Value Date   MICROALBUR 5.6* 01/30/2014

## 2014-07-28 DIAGNOSIS — R3913 Splitting of urinary stream: Secondary | ICD-10-CM | POA: Diagnosis not present

## 2014-07-28 DIAGNOSIS — N400 Enlarged prostate without lower urinary tract symptoms: Secondary | ICD-10-CM | POA: Diagnosis not present

## 2014-07-28 DIAGNOSIS — N480 Leukoplakia of penis: Secondary | ICD-10-CM | POA: Diagnosis not present

## 2014-08-01 ENCOUNTER — Other Ambulatory Visit (INDEPENDENT_AMBULATORY_CARE_PROVIDER_SITE_OTHER): Payer: Medicare Other

## 2014-08-01 DIAGNOSIS — E119 Type 2 diabetes mellitus without complications: Secondary | ICD-10-CM | POA: Diagnosis not present

## 2014-08-01 DIAGNOSIS — Z125 Encounter for screening for malignant neoplasm of prostate: Secondary | ICD-10-CM | POA: Diagnosis not present

## 2014-08-01 LAB — LIPID PANEL
Cholesterol: 131 mg/dL (ref 0–200)
HDL: 41.2 mg/dL (ref >39.00)
LDL CALC: 61 mg/dL (ref 0–99)
NonHDL: 89.8
TRIGLYCERIDES: 143 mg/dL (ref 0.0–149.0)
Total CHOL/HDL Ratio: 3
VLDL: 28.6 mg/dL (ref 0.0–40.0)

## 2014-08-01 LAB — COMPREHENSIVE METABOLIC PANEL
ALK PHOS: 64 U/L (ref 39–117)
ALT: 28 U/L (ref 0–53)
AST: 19 U/L (ref 0–37)
Albumin: 4.3 g/dL (ref 3.5–5.2)
BUN: 14 mg/dL (ref 6–23)
CO2: 30 mEq/L (ref 19–32)
Calcium: 9.6 mg/dL (ref 8.4–10.5)
Chloride: 103 mEq/L (ref 96–112)
Creatinine, Ser: 1.52 mg/dL — ABNORMAL HIGH (ref 0.40–1.50)
GFR: 48.49 mL/min — ABNORMAL LOW (ref >60.00)
GLUCOSE: 141 mg/dL — AB (ref 70–99)
POTASSIUM: 5 meq/L (ref 3.5–5.1)
SODIUM: 139 meq/L (ref 135–145)
Total Bilirubin: 0.6 mg/dL (ref 0.2–1.2)
Total Protein: 6.5 g/dL (ref 6.0–8.3)

## 2014-08-01 LAB — MICROALBUMIN / CREATININE URINE RATIO
Creatinine,U: 120.1 mg/dL
Microalb Creat Ratio: 1.7 mg/g (ref 0.0–30.0)
Microalb, Ur: 2 mg/dL — ABNORMAL HIGH (ref 0.0–1.9)

## 2014-08-01 LAB — HEMOGLOBIN A1C: HEMOGLOBIN A1C: 6.6 % — AB (ref 4.6–6.5)

## 2014-08-01 LAB — PSA, MEDICARE: PSA: 1.08 ng/ml (ref 0.10–4.00)

## 2014-08-02 ENCOUNTER — Encounter: Payer: Self-pay | Admitting: Internal Medicine

## 2014-08-28 ENCOUNTER — Ambulatory Visit: Payer: Medicare Other | Admitting: Cardiovascular Disease

## 2014-09-14 ENCOUNTER — Encounter: Payer: Self-pay | Admitting: Cardiovascular Disease

## 2014-09-14 ENCOUNTER — Ambulatory Visit (INDEPENDENT_AMBULATORY_CARE_PROVIDER_SITE_OTHER): Payer: Medicare Other | Admitting: Cardiovascular Disease

## 2014-09-14 VITALS — BP 128/62 | HR 53 | Ht 67.0 in | Wt 199.2 lb

## 2014-09-14 DIAGNOSIS — E669 Obesity, unspecified: Secondary | ICD-10-CM | POA: Diagnosis not present

## 2014-09-14 DIAGNOSIS — I2510 Atherosclerotic heart disease of native coronary artery without angina pectoris: Secondary | ICD-10-CM | POA: Diagnosis not present

## 2014-09-14 DIAGNOSIS — E785 Hyperlipidemia, unspecified: Secondary | ICD-10-CM

## 2014-09-14 DIAGNOSIS — I10 Essential (primary) hypertension: Secondary | ICD-10-CM

## 2014-09-14 NOTE — Assessment & Plan Note (Signed)
We discussed the importance of  diet and exercise.

## 2014-09-14 NOTE — Patient Instructions (Signed)
Continue same medications.   Your physician wants you to follow-up in: 6 months.  You will receive a reminder letter in the mail two months in advance. If you don't receive a letter, please call our office to schedule the follow-up appointment.  

## 2014-09-14 NOTE — Assessment & Plan Note (Signed)
Blood pressure improved significantly after he was switched from atenolol to carvedilol.

## 2014-09-14 NOTE — Assessment & Plan Note (Signed)
He is doing well overall with stable anginal symptoms. Continue medical therapy.

## 2014-09-14 NOTE — Progress Notes (Signed)
HPI  This is a pleasant 69 year old male who is here today for a followup visit.  He has known history of coronary artery disease status post CABG in 1999. He also has known history of hypertension, hyperlipidemia and type 2 diabetes. He has done well from a cardiac standpoint up until 2 years ago when he started having mild exertional chest pain. He underwent a cardiac catheterization in 2011 which showed patent grafts. He continued to have mild exertional chest pain. A treadmill nuclear stress test in 06/2012 showed normal ejection fraction. Fixed inferior wall defect with normal wall motion and moderate reversibility in the septal wall.  He was started him on Ranexa with significant improvement in angina.  During last visit, he was noted to be hypertensive and bradycardic. I switched him from atenolol to carvedilol with subsequent improvement. He gets occasional substernal chest tightness but he does not have to take nitroglycerin.   No Known Allergies   Current Outpatient Prescriptions on File Prior to Visit  Medication Sig Dispense Refill  . aspirin 81 MG tablet Take by mouth. Takes 2 tablets daily.    . carvedilol (COREG) 6.25 MG tablet Take 1 tablet (6.25 mg total) by mouth 2 (two) times daily. 180 tablet 3  . isosorbide mononitrate (IMDUR) 60 MG 24 hr tablet Take 60 mg by mouth daily. 60 mg tablet daily.    Marland Kitchen losartan (COZAAR) 50 MG tablet Take 1 tablet (50 mg total) by mouth daily. 90 tablet 1  . Multiple Vitamin (MULTIVITAMIN) tablet Take 1 tablet by mouth daily.      . nitroGLYCERIN (NITROSTAT) 0.4 MG SL tablet Place 1 tablet (0.4 mg total) under the tongue every 5 (five) minutes as needed. 30 tablet 3  . Omega-3 Fatty Acids (FISH OIL) 1200 MG CAPS Take by mouth daily.    Marland Kitchen omeprazole (PRILOSEC) 20 MG capsule TAKE 1 CAPSULE BY MOUTH EVERY OTHER MORNING AS DIRECTED    . ranolazine (RANEXA) 1000 MG SR tablet Take 1,000 mg by mouth daily.    . simvastatin (ZOCOR) 20 MG tablet TAKE  1 TABLET BY MOUTH EVERY NIGHT AT BEDTIME 90 tablet 3  . tamsulosin (FLOMAX) 0.4 MG CAPS capsule Take 0.4 mg by mouth.    . zolpidem (AMBIEN) 10 MG tablet Take 1 tablet (10 mg total) by mouth at bedtime as needed for sleep. 30 tablet 3   No current facility-administered medications on file prior to visit.     Past Medical History  Diagnosis Date  . 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 Diabetes mellitus without complication   . History of cardiac catheterization 2011    [facility]  . 3-vessel coronary artery disease     s/p  5 vessel CABG  . Hypertension   . Hypertriglyceridemia   . Hyperlipidemia      Past Surgical History  Procedure Laterality Date  . Coronary artery bypass graft  03/1998    5 vessel, [facility]  . Cardiac catheterization  05-19-2010    [facility]: Patent grafts. LIMA to LAD, SVG to D1, OM1 and RPDA     Family History  Problem Relation Age of Onset  . Heart attack Mother 74  . Diabetes Mother   . Hypertension Mother   . Heart attack Father 32  . Heart disease Father      History   Social History  . Marital Status: Married    Spouse Name: N/A  . Number of Children: N/A  . Years of Education:  N/A   Occupational History  . Not on file.   Social History Main Topics  . Smoking status: Never Smoker   . Smokeless tobacco: Never Used  . Alcohol Use: No  . Drug Use: No  . Sexual Activity: Not on file   Other Topics Concern  . Not on file   Social History Narrative        PHYSICAL EXAM   BP 128/62 mmHg  Pulse 53  Ht 5\' 7"  (1.702 m)  Wt 199 lb 4 oz (90.379 kg)  BMI 31.20 kg/m2 Constitutional: He is oriented to person, place, and time. He appears well-developed and well-nourished. No distress.  HENT: No nasal discharge.  Head: Normocephalic and atraumatic.  Eyes: Pupils are equal and round. Right eye exhibits no discharge. Left eye exhibits no discharge.  Neck: Normal range of motion. Neck supple. No JVD present. No thyromegaly present.  Cardiovascular:  Normal rate, regular rhythm, normal heart sounds and. Exam reveals no gallop and no friction rub. No murmur heard.  Pulmonary/Chest: Effort normal and breath sounds normal. No stridor. No respiratory distress. He has no wheezes. He has no rales. He exhibits no tenderness.  Abdominal: Soft. Bowel sounds are normal. He exhibits no distension. There is no tenderness. There is no rebound and no guarding.  Musculoskeletal: Normal range of motion. He exhibits no edema and no tenderness.  Neurological: He is alert and oriented to person, place, and time. Coordination normal.  Skin: Skin is warm and dry. No rash noted. He is not diaphoretic. No erythema. No pallor.  Psychiatric: He has a normal mood and affect. His behavior is normal. Judgment and thought content normal.       EKG: Sinus  Bradycardia  -  Nonspecific T-abnormality.   ABNORMAL      ASSESSMENT AND PLAN

## 2014-09-14 NOTE — Assessment & Plan Note (Signed)
Lab Results  Component Value Date   CHOL 131 08/01/2014   HDL 41.20 08/01/2014   LDLCALC 61 08/01/2014   TRIG 143.0 08/01/2014   CHOLHDL 3 08/01/2014   Lipid profile was optimal on current dose of simvastatin.

## 2014-09-30 NOTE — Op Note (Signed)
PATIENT NAME:  Johnathan Arnold, Johnathan Arnold MR#:  [id number] DATE OF BIRTH:  11-Jul-1945  DATE OF PROCEDURE:  03/20/2014   PREOPERATIVE DIAGNOSIS: Split urinary stream, penile lesion.   POSTOPERATIVE DIAGNOSES:   1.  Split urinary stream, penile lesion. 2.  Meatal stenosis.   PROCEDURE PERFORMED: Penile biopsy, cystoscopy, meatal dilation.   ATTENDING SURGEON: Sherlynn Stalls, MD  ANESTHESIA:  General anesthesia.   ESTIMATED BLOOD LOSS: Minimal.   DRAINS: None.   COMPLICATIONS: None.   SPECIMENS: Penile biopsy.   INDICATION:  This is a 69 year old male with a rubbery left penile mass on his coronal margin  which has been present for about approximately 3 months.  He was counseled to undergo biopsy of this area.  He also complains of a split urinary stream and also we will proceed with cystoscopy with possible dilation as needed.   In the OR risks and benefits of the procedure were explained in detail.  The patient agreed to proceed as planned.   PROCEDURE: The patient was correctly identified in the preoperative holding area and informed consent was obtained.  He was brought to the operating suite and placed on the table in the supine position.  At this time, a universal timeout protocol was performed.  All team members were identified.  Venodyne boots were placed.  He was administered 2 grams of IV Ancef in the perioperative period.  He was then placed under general anesthesia, repositioned lower on the bed in the dorsal lithotomy position and prepped and draped in standard surgical fashion.  At this point in time, the penis was inspected which revealed a somewhat sclerotic glans  and friable, thinned and relatively sclerotic penile shaft.  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  The meatus was noted to be somewhat small in caliber.  The penile lesion in question was approximately 1 cm in length on the left coronal margin.  This was rubbery, nonfixed, but slightly indurated.   At this  point in time, we proceeded with rigid cystoscopy using the 22 French access sheath into the bladder.  I was unable to introduce this due to some meatal stenosis and  male sounds are used starting at 71 [place] up to a 24 [place] in order to be able to accommodate the scope.  The penile urethra was noted to be free of lesions or strictures.  At the prostate,  it was noted to have significant trilobar coaptation and a long prostatic fossa, length of approximately 4 cm.  There was an elevated bladder neck and a moderately trabeculated bladder noted.  Bilateral ureteral orifices were identified.  There is no bladder tumors or lesions or stones.  The bladder was then drained and the scope was removed.  Attention was then turned to penile lesion biopsy. An Olympus incision was made parallel to the coronal margin and the lesion in question was completely excised.  This was passed off the table as penile lesion.  The ellipse incision was then brought together using a series of interrupted 4-0 chromic sutures x 5 in an interrupted fashion.  Adequate hemostasis was noted.  Approximately 2 mL of 1% lidocaine were used  both before and after for local anesthesia.  The penis was then wrapped in a Vaseline gauze and conformed dressing.  He was cleaned and dried, repositioned in the supine position and reversed from anesthesia.  There were no complications in this case.     ____________________________ Sherlynn Stalls, MD ajb:DT D: 03/20/2014 12:27:33 ET  T: 03/20/2014 13:02:04 ET JOB#: [id number]  cc: Sherlynn Stalls, MD, <Dictator> Sherlynn Stalls MD ELECTRONICALLY SIGNED 03/23/2014 9:19

## 2014-10-09 ENCOUNTER — Other Ambulatory Visit: Payer: Self-pay | Admitting: Cardiovascular Disease

## 2014-10-09 ENCOUNTER — Other Ambulatory Visit: Payer: Self-pay | Admitting: Internal Medicine

## 2014-10-09 NOTE — Telephone Encounter (Signed)
Rx phoned into pharmacy.

## 2014-10-09 NOTE — Telephone Encounter (Signed)
Last OV 07/17/14, ok refill?

## 2014-10-09 NOTE — Telephone Encounter (Signed)
Ok to refill, 

## 2014-10-11 ENCOUNTER — Emergency Department
Admission: EM | Admit: 2014-10-11 | Discharge: 2014-10-12 | Disposition: A | Discharge status: Home / Self Care | Payer: Medicare Other | Attending: Emergency Medicine | Admitting: Emergency Medicine

## 2014-10-11 ENCOUNTER — Encounter: Payer: Self-pay | Admitting: Emergency Medicine

## 2014-10-11 ENCOUNTER — Other Ambulatory Visit: Payer: Self-pay

## 2014-10-11 DIAGNOSIS — Z7982 Long term (current) use of aspirin: Secondary | ICD-10-CM | POA: Insufficient documentation

## 2014-10-11 DIAGNOSIS — R112 Nausea with vomiting, unspecified: Secondary | ICD-10-CM | POA: Diagnosis not present

## 2014-10-11 DIAGNOSIS — R42 Dizziness and giddiness: Secondary | ICD-10-CM | POA: Insufficient documentation

## 2014-10-11 DIAGNOSIS — I10 Essential (primary) hypertension: Secondary | ICD-10-CM | POA: Diagnosis not present

## 2014-10-11 DIAGNOSIS — E119 Type 2 diabetes mellitus without complications: Secondary | ICD-10-CM | POA: Insufficient documentation

## 2014-10-11 DIAGNOSIS — R110 Nausea: Secondary | ICD-10-CM

## 2014-10-11 DIAGNOSIS — Z79899 Other long term (current) drug therapy: Secondary | ICD-10-CM | POA: Insufficient documentation

## 2014-10-11 LAB — CBC
HCT: 38 % — ABNORMAL LOW (ref 40.0–52.0)
Hemoglobin: 13 g/dL (ref 13.0–18.0)
MCH: 32 pg (ref 26.0–34.0)
MCHC: 34.2 g/dL (ref 32.0–36.0)
MCV: 93.7 fL (ref 80.0–100.0)
PLATELETS: 200 10*3/uL (ref 150–440)
RBC: 4.06 MIL/uL — ABNORMAL LOW (ref 4.40–5.90)
RDW: 13.1 % (ref 11.5–14.5)
WBC: 8.7 10*3/uL (ref 3.8–10.6)

## 2014-10-11 LAB — COMPREHENSIVE METABOLIC PANEL
ALBUMIN: 4.7 g/dL (ref 3.5–5.0)
ALT: 31 U/L (ref 17–63)
ANION GAP: 9 (ref 5–15)
AST: 22 U/L (ref 15–41)
Alkaline Phosphatase: 58 U/L (ref 38–126)
BILIRUBIN TOTAL: 0.7 mg/dL (ref 0.3–1.2)
BUN: 18 mg/dL (ref 6–20)
CALCIUM: 9.7 mg/dL (ref 8.9–10.3)
CHLORIDE: 104 mmol/L (ref 101–111)
CO2: 25 mmol/L (ref 22–32)
Creatinine, Ser: 1.72 mg/dL — ABNORMAL HIGH (ref 0.61–1.24)
GFR calc Af Amer: 45 mL/min — ABNORMAL LOW (ref >60)
GFR calc non Af Amer: 39 mL/min — ABNORMAL LOW (ref >60)
GLUCOSE: 126 mg/dL — AB (ref 65–99)
Potassium: 4.5 mmol/L (ref 3.5–5.1)
Sodium: 138 mmol/L (ref 135–145)
TOTAL PROTEIN: 7.2 g/dL (ref 6.5–8.1)

## 2014-10-11 LAB — TROPONIN I: Troponin I: <0.03 ng/mL (ref <0.031)

## 2014-10-11 MED ORDER — ONDANSETRON HCL 4 MG/2ML IJ SOLN
4.0000 mg | Freq: Once | INTRAMUSCULAR | Status: AC
  Administered 2014-10-11: 4 mg via INTRAVENOUS

## 2014-10-11 MED ORDER — SODIUM CHLORIDE 0.9 % IV BOLUS (SEPSIS)
1000.0000 mL | Freq: Once | INTRAVENOUS | Status: AC
  Administered 2014-10-11: 1000 mL via INTRAVENOUS

## 2014-10-11 MED ORDER — ONDANSETRON HCL 4 MG/2ML IJ SOLN
INTRAMUSCULAR | Status: AC
  Administered 2014-10-11: 4 mg via INTRAVENOUS
  Filled 2014-10-11: qty 2

## 2014-10-11 NOTE — ED Notes (Signed)
Assisted patient up to bathroom, patient denies dizziness or lightheadedness. MD at bedside. NAD noted.

## 2014-10-11 NOTE — ED Notes (Signed)
Patient brought in by [facility] with complaint of dizziness, lightheadedness, HTN, and vomiting, onset about 06:30 this evening. Patient reports suddenly began to feel "funny." Patient states felt dizzy, lightheaded and nauseous. Per EMS patient vomited x2 prior to arrival to hospital. Patient states "I turned my head but I felt like the room wouldn't turn with me...the room wasn't spinning, it was just a funny feeling." Patient also reports having unsteady gait at home. Patient reports laying down with eyes closed makes him feel better. Patient reports when symptoms began, he laied down on floor and began to feel better. Patient alert and oriented, calm and cooperative, patient has history of high blood pressure. Skin warm and dry. Denies chest pain, shortness of breath, fevers, or abdominal pain. Patient speaking in complete sentences. MD at bedside.

## 2014-10-11 NOTE — ED Notes (Signed)
Patient unable to provide urine specimen at this time, patient verbalized understanding of notifying staff when able to.

## 2014-10-11 NOTE — ED Provider Notes (Signed)
[facility] Emergency Department Provider Note    ____________________________________________  Time seen: On arrival  I have reviewed the triage vital signs and the nursing notes.   HISTORY  Chief Complaint No chief complaint on file.   History limited by: Not Limited   HPI Johnathan Arnold. is a 69 y.o. male Is brought in by EMS today because of dizziness and vomiting. The patient stated that these symptoms started just prior to arrival. The patient had been working on a model airplane had come inside and started drinking fluids when he became dizzy. He states that it increased in intensity over the course about 10 or 15 minutes. He then had vomiting. He states he had the feelings worse when he would be up moving about with his eyes open. He states sitting down and closing his eyes helped the symptoms. He describes it as a sense that when he moves his eyes the room is slow to catch up. He denies any similar symptoms in the past. Denies any fevers. Denies any chest pain.     Past Medical History  Diagnosis Date  . 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 Diabetes mellitus without complication   . History of cardiac catheterization 2011    [facility]  . 3-vessel coronary artery disease     s/p  5 vessel CABG  . Hypertension   . Hypertriglyceridemia   . Hyperlipidemia     Patient Active Problem List   Diagnosis Date Noted  . Acute renal failure 05/02/2014  . Benign prostatic hypertrophy with urinary frequency 01/31/2014  . Encounter for preventive health examination 07/02/2013  . Change in bowel habits 07/02/2013  . Obesity 04/03/2013  . Other malaise and fatigue 09/21/2012  . Hyperlipidemia   . Hypertension   . 3-vessel coronary artery disease   . S/P CABG x 5   . Hypertriglyceridemia   . Diabetes mellitus type 2, controlled 05/21/2011  . 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 Coronary artery bypass graft  03/1998    5 vessel, [facility]  . Cardiac catheterization  05-19-2010    [facility]: Patent grafts. LIMA to LAD, SVG to D1, OM1 and RPDA    Current Outpatient Rx  Name  Route  Sig  Dispense  Refill  . aspirin 81 MG tablet   Oral   Take by mouth. Takes 2 tablets daily.         . carvedilol (COREG) 6.25 MG tablet   Oral   Take 1 tablet (6.25 mg total) by mouth 2 (two) times daily.   180 tablet   3   . isosorbide mononitrate (IMDUR) 60 MG 24 hr tablet   Oral   Take 60 mg by mouth daily. 60 mg tablet daily.         Marland Kitchen losartan (COZAAR) 50 MG tablet   Oral   Take 1 tablet (50 mg total) by mouth daily.   90 tablet   1   . Multiple Vitamin (MULTIVITAMIN) tablet   Oral   Take 1 tablet by mouth daily.           . nitroGLYCERIN (NITROSTAT) 0.4 MG SL tablet   Sublingual   Place 1 tablet (0.4 mg total) under the tongue every 5 (five) minutes as needed.   30 tablet   3   . Omega-3 Fatty Acids (FISH OIL) 1200 MG CAPS   Oral  Take by mouth daily.         Marland Kitchen omeprazole (PRILOSEC) 20 MG capsule      TAKE 1 CAPSULE BY MOUTH EVERY OTHER MORNING AS DIRECTED         . RANEXA 1000 MG SR tablet      TAKE 1 TABLET BY MOUTH TWICE DAILY   60 tablet   3   . ranolazine (RANEXA) 1000 MG SR tablet   Oral   Take 1,000 mg by mouth daily.         . simvastatin (ZOCOR) 20 MG tablet      TAKE 1 TABLET BY MOUTH EVERY NIGHT AT BEDTIME   90 tablet   3   . tamsulosin (FLOMAX) 0.4 MG CAPS capsule   Oral   Take 0.4 mg by mouth.         . zolpidem (AMBIEN) 10 MG tablet      TAKE 1 TABLET BY MOUTH AT BEDTIME AS NEEDED FOR SLEEP   30 tablet   2     Allergies Review of patient's allergies indicates no known allergies.  Family History  Problem Relation Age of Onset  . Heart attack Mother 22  . Diabetes Mother   . Hypertension Mother   . Heart attack Father 4  . Heart disease Father     Social History History  Substance Use Topics   . Smoking status: Never Smoker   . Smokeless tobacco: Never Used  . Alcohol Use: No    Review of Systems  Constitutional: Negative for fever. Eyes: no blurry vision, slow vision Cardiovascular: Negative for chest pain. Respiratory: Negative for shortness of breath. Gastrointestinal: Negative for abdominal pain, vomiting and diarrhea. Genitourinary: Negative for dysuria. Musculoskeletal: Negative for back pain. Skin: Negative for rash. Neurological: Negative for headaches, focal weakness or numbness. Positive fordizziness   10-point ROS otherwise negative.  ____________________________________________   PHYSICAL EXAM:  Constitutional: Alert and oriented. Well appearing and in no distress. Eyes: Conjunctivae are normal. PERRL. Normal extraocular movements. ENT   Head: Normocephalic and atraumatic.no nystagmus noted   Nose: No congestion/rhinnorhea.   Mouth/Throat: Mucous membranes are moist.   Neck: No stridor. Hematological/Lymphatic/Immunilogical: No cervical lymphadenopathy. Cardiovascular: Normal rate, regular rhythm.  No murmurs, rubs, or gallops. Respiratory: Normal respiratory effort without tachypnea nor retractions. Breath sounds are clear and equal bilaterally. No wheezes/rales/rhonchi. Gastrointestinal: Soft and nontender. No distention.  Genitourinary: Deferred Musculoskeletal: Normal range of motion in all extremities. No joint effusions.  No lower extremity tenderness nor edema. Neurologic:  Normal speech and language. Cranial nerves 2 through 12 grossly intact. No nystagmus. Perll. Extraocular motions intact.No gross focal neurologic deficits are appreciated. Speech is normal. Figure to nose normal Skin:  Skin is warm, dry and intact. No rash noted. Psychiatric: Mood and affect are normal. Speech and behavior are normal. Patient exhibits appropriate insight and judgment.  ____________________________________________    LABS (pertinent  positives/negatives)  Labs Reviewed  CBC - Abnormal; Notable for the following:    RBC 4.06 (*)    HCT 38.0 (*)    All other components within normal limits  COMPREHENSIVE METABOLIC PANEL - Abnormal; Notable for the following:    Glucose, Bld 126 (*)    Creatinine, Ser 1.72 (*)    GFR calc non Af Amer 39 (*)    GFR calc Af Amer 45 (*)    All other components within normal limits  TROPONIN I  URINALYSIS COMPLETEWITH MICROSCOPIC ([facility])      ____________________________________________  EKG  EKG Time: 2115 Rate: 58 Rhythm: sinus bradycardia Axis: normal Intervals: QTc 420 QRS: normal ST changes: T-wave inversion V1 no ST elevation    ____________________________________________    RADIOLOGY  none  ____________________________________________   PROCEDURES  Procedure(s) performed: None  Critical Care performed: No  ____________________________________________   INITIAL IMPRESSION / ASSESSMENT AND PLAN / ED COURSE  Pertinent labs & imaging results that were available during my care of the patient were reviewed by me and considered in my medical decision making (see chart for details).  Patient here with complaints of dizziness and nausea and vomiting.  At this point unclear etiology however I doubt central cause. Will check basic blood work give light fluids and antiemetics and reassessed.  ----------------------------------------- 11:31 PM on 10/11/2014 -----------------------------------------  patient states he feels much better has been able to stand up with out any dizziness. ____________________________________________   FINAL CLINICAL IMPRESSION(S) / ED DIAGNOSES  Final diagnoses:  Nausea  Dizziness     Nance Pear, MD 10/15/14 1759

## 2014-10-11 NOTE — Discharge Instructions (Signed)
Please seek medical attention for any high fevers, chest pain, shortness of breath, change in behavior, persistent vomiting, bloody stool or any other new or concerning symptoms. ° °Dizziness °Dizziness is a common problem. It is a feeling of unsteadiness or light-headedness. You may feel like you are about to faint. Dizziness can lead to injury if you stumble or fall. A person of any age group can suffer from dizziness, but dizziness is more common in older adults. °CAUSES  °Dizziness can be caused by many different things, including: °· Middle ear problems. °· Standing for too long. °· Infections. °· An allergic reaction. °· Aging. °· An emotional response to something, such as the sight of blood. °· Side effects of medicines. °· Tiredness. °· Problems with circulation or blood pressure. °· Excessive use of alcohol or medicines, or illegal drug use. °· Breathing too fast (hyperventilation). °· An irregular heart rhythm (arrhythmia). °· A low red blood cell count (anemia). °· Pregnancy. °· Vomiting, diarrhea, fever, or other illnesses that cause body fluid loss (dehydration). °· Diseases or conditions such as Parkinson's disease, high blood pressure (hypertension), diabetes, and thyroid problems. °· Exposure to extreme heat. °DIAGNOSIS  °Your health care provider will ask about your symptoms, perform a physical exam, and perform an electrocardiogram (ECG) to record the electrical activity of your heart. Your health care provider may also perform other heart or blood tests to determine the cause of your dizziness. These may include: °· Transthoracic echocardiogram (TTE). During echocardiography, sound waves are used to evaluate how blood flows through your heart. °· Transesophageal echocardiogram (TEE). °· Cardiac monitoring. This allows your health care provider to monitor your heart rate and rhythm in real time. °· Holter monitor. This is a portable device that records your heartbeat and can help diagnose heart  arrhythmias. It allows your health care provider to track your heart activity for several days if needed. °· Stress tests by exercise or by giving medicine that makes the heart beat faster. °TREATMENT  °Treatment of dizziness depends on the cause of your symptoms and can vary greatly. °HOME CARE INSTRUCTIONS  °· Drink enough fluids to keep your urine clear or pale yellow. This is especially important in very hot weather. In older adults, it is also important in cold weather. °· Take your medicine exactly as directed if your dizziness is caused by medicines. When taking blood pressure medicines, it is especially important to get up slowly. °· Rise slowly from chairs and steady yourself until you feel okay. °· In the morning, first sit up on the side of the bed. When you feel okay, stand slowly while holding onto something until you know your balance is fine. °· Move your legs often if you need to stand in one place for a long time. Tighten and relax your muscles in your legs while standing. °· Have someone stay with you for 1-2 days if dizziness continues to be a problem. Do this until you feel you are well enough to stay alone. Have the person call your health care provider if he or she notices changes in you that are concerning. °· Do not drive or use heavy machinery if you feel dizzy. °· Do not drink alcohol. °SEEK IMMEDIATE MEDICAL CARE IF:  °· Your dizziness or light-headedness gets worse. °· You feel nauseous or vomit. °· You have problems talking, walking, or using your arms, hands, or legs. °· You feel weak. °· You are not thinking clearly or you have trouble forming sentences. It may   take a friend or family member to notice this. °· You have chest pain, abdominal pain, shortness of breath, or sweating. °· Your vision changes. °· You notice any bleeding. °· You have side effects from medicine that seems to be getting worse rather than better. °MAKE SURE YOU:  °· Understand these instructions. °· Will watch  your condition. °· Will get help right away if you are not doing well or get worse. °Document Released: 11/19/2000 Document Revised: 05/31/2013 Document Reviewed: 12/13/2010 °[employer]® Patient Information ©2015 [employer]. This information is not intended to replace advice given to you by your health care provider. Make sure you discuss any questions you have with your health care provider. ° ° °

## 2014-10-12 ENCOUNTER — Telehealth: Payer: Self-pay | Admitting: Internal Medicine

## 2014-10-12 LAB — URINALYSIS COMPLETE WITH MICROSCOPIC (ARMC ONLY)
BILIRUBIN URINE: NEGATIVE
Bacteria, UA: NONE SEEN
Glucose, UA: NEGATIVE mg/dL
HGB URINE DIPSTICK: NEGATIVE
NITRITE: NEGATIVE
PH: 5 (ref 5.0–8.0)
Protein, ur: NEGATIVE mg/dL
Specific Gravity, Urine: 1.012 (ref 1.005–1.030)
Squamous Epithelial / LPF: NONE SEEN

## 2014-10-12 NOTE — Telephone Encounter (Signed)
Attempted to call both home and cell with no response . Left a message on the patient's home phone to call the office to schedule his ER follow up appointment.

## 2014-10-12 NOTE — ED Notes (Signed)
E-signature not working, patient and this Therapist, sports signed Disposition Report. NAD noted. Patient refused wheelchair, ambulated out of ED with steady gait.

## 2014-10-14 ENCOUNTER — Other Ambulatory Visit: Payer: Self-pay | Admitting: Internal Medicine

## 2014-10-16 ENCOUNTER — Ambulatory Visit (INDEPENDENT_AMBULATORY_CARE_PROVIDER_SITE_OTHER): Payer: Medicare Other | Admitting: Internal Medicine

## 2014-10-16 ENCOUNTER — Encounter: Payer: Self-pay | Admitting: Internal Medicine

## 2014-10-16 VITALS — BP 130/60 | HR 59 | Temp 97.9°F | Resp 14 | Ht 67.0 in | Wt 198.5 lb

## 2014-10-16 DIAGNOSIS — H81399 Other peripheral vertigo, unspecified ear: Secondary | ICD-10-CM | POA: Diagnosis not present

## 2014-10-16 DIAGNOSIS — N179 Acute kidney failure, unspecified: Secondary | ICD-10-CM | POA: Diagnosis not present

## 2014-10-16 DIAGNOSIS — I2510 Atherosclerotic heart disease of native coronary artery without angina pectoris: Secondary | ICD-10-CM

## 2014-10-16 LAB — BASIC METABOLIC PANEL
BUN: 14 mg/dL (ref 6–23)
CO2: 27 mEq/L (ref 19–32)
CREATININE: 1.44 mg/dL (ref 0.40–1.50)
Calcium: 9.2 mg/dL (ref 8.4–10.5)
Chloride: 103 mEq/L (ref 96–112)
GFR: 51.58 mL/min — ABNORMAL LOW (ref >60.00)
GLUCOSE: 147 mg/dL — AB (ref 70–99)
POTASSIUM: 4.2 meq/L (ref 3.5–5.1)
Sodium: 137 mEq/L (ref 135–145)

## 2014-10-16 MED ORDER — MECLIZINE HCL 12.5 MG PO TABS: 12.5000 mg | ORAL_TABLET | Freq: Three times a day (TID) | ORAL | Status: DC | PRN

## 2014-10-16 NOTE — Progress Notes (Signed)
Pre-visit discussion using our clinic review tool. No additional management support is needed unless otherwise documented below in the visit note.  

## 2014-10-16 NOTE — Patient Instructions (Addendum)
I am giving you a medication to take if you get another attack of Vertigo  Vertigo Vertigo means you feel like you or your surroundings are moving when they are not. Vertigo can be dangerous if it occurs when you are at work, driving, or performing difficult activities.  CAUSES  Vertigo occurs when there is a conflict of signals sent to your brain from the visual and sensory systems in your body. There are many different causes of vertigo, including:  Infections, especially in the inner ear.  A bad reaction to a drug or misuse of alcohol and medicines.  Withdrawal from drugs or alcohol.  Rapidly changing positions, such as lying down or rolling over in bed.  A migraine headache.  Decreased blood flow to the brain.  Increased pressure in the brain from a head injury, infection, tumor, or bleeding. SYMPTOMS  You may feel as though the world is spinning around or you are falling to the ground. Because your balance is upset, vertigo can cause nausea and vomiting. You may have involuntary eye movements (nystagmus). DIAGNOSIS  Vertigo is usually diagnosed by physical exam. If the cause of your vertigo is unknown, your caregiver may perform imaging tests, such as an MRI scan (magnetic resonance imaging). TREATMENT  Most cases of vertigo resolve on their own, without treatment. Depending on the cause, your caregiver may prescribe certain medicines. If your vertigo is related to body position issues, your caregiver may recommend movements or procedures to correct the problem. In rare cases, if your vertigo is caused by certain inner ear problems, you may need surgery. HOME CARE INSTRUCTIONS   Follow your caregiver's instructions.  Avoid driving.  Avoid operating heavy machinery.  Avoid performing any tasks that would be dangerous to you or others during a vertigo episode.  Tell your caregiver if you notice that certain medicines seem to be causing your vertigo. Some of the medicines used  to treat vertigo episodes can actually make them worse in some people. SEEK IMMEDIATE MEDICAL CARE IF:   Your medicines do not relieve your vertigo or are making it worse.  You develop problems with talking, walking, weakness, or using your arms, hands, or legs.  You develop severe headaches.  Your nausea or vomiting continues or gets worse.  You develop visual changes.  A family member notices behavioral changes.  Your condition gets worse. MAKE SURE YOU:  Understand these instructions.  Will watch your condition.  Will get help right away if you are not doing well or get worse. Document Released: 03/05/2005 Document Revised: 08/18/2011 Document Reviewed: 12/12/2010 [facility] Patient Information 2015 [place], [place]. This information is not intended to replace advice given to you by your health care provider. Make sure you discuss any questions you have with your health care provider.

## 2014-10-16 NOTE — Progress Notes (Signed)
Patient ID: Johnathan Vandenbrink., male   DOB: 09-07-1945, 69 y.o.   MRN: [id number]  Patient Active Problem List   Diagnosis Date Noted  . Vertigo, peripheral 10/17/2014  . Acute renal failure 05/02/2014  . Benign prostatic hypertrophy with urinary frequency 01/31/2014  . Encounter for preventive health examination 07/02/2013  . Change in bowel habits 07/02/2013  . Obesity 04/03/2013  . Other malaise and fatigue 09/21/2012  . Hyperlipidemia   . Hypertension   . 3-vessel coronary artery disease   . S/P CABG x 5   . Hypertriglyceridemia   . Diabetes mellitus type 2, controlled 05/21/2011  . 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 Follow-up    ER notes in chart. Dizzy and sick increased BP.    HPI:   Johnathan Sato. is a 69 y.o. male who presents for   ER follow up for recent episode of vertigo,  Hypertension .  Occurred after working on his model airpliane in his garage .  Denies being  overheated in the garagae,  Was not sweating, but may not have had adequate ventilation .  Had skipped dinner, came inside  And started feeling dizzy followed by  Recurrent nausea with vomiting.  Denies chest pain , jaw pain,  Arm pain.  Symptoms lasted over 2 hours before he called 911., bp shot up to 606 systolic. Did not feel liek a hypoglycemic event; no tremors and diaphoresis.  In ER was evaluated for AMI, with EKG and troponin x 1,  UA done,  IV fluids given.  No recurrence.   Past Medical History  Diagnosis Date  . 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 Diabetes mellitus without complication   . History of cardiac catheterization 2011    [facility]  . 3-vessel coronary artery disease     s/p  5 vessel CABG  . Hypertension   . Hypertriglyceridemia   . Hyperlipidemia     Past Surgical History  Procedure Laterality Date  . Coronary artery bypass graft  03/1998    5 vessel, [facility]  . Cardiac  catheterization  05-19-2010    [facility]: Patent grafts. LIMA to LAD, SVG to D1, OM1 and RPDA       The following portions of the patient's history were reviewed and updated as appropriate: Allergies, current medications, and problem list.    Review of Systems:   Patient denies headache, fevers, malaise, unintentional weight loss, skin rash, eye pain, sinus congestion and sinus pain, sore throat, dysphagia,  hemoptysis , cough, dyspnea, wheezing, chest pain, palpitations, orthopnea, edema, abdominal pain, nausea, melena, diarrhea, constipation, flank pain, dysuria, hematuria, urinary  Frequency, nocturia, numbness, tingling, seizures,  Focal weakness, Loss of consciousness,  Tremor, insomnia, depression, anxiety, and suicidal ideation.     History   Social History  . Marital Status: Married    Spouse Name: N/A  . Number of Children: N/A  . Years of Education: N/A   Occupational History  . Not on file.   Social History Main Topics  . Smoking status: Never Smoker   . Smokeless tobacco: Never Used  . Alcohol Use: No  . Drug Use: No  . Sexual Activity: Not on file   Other Topics Concern  . Not on file   Social History Narrative    Objective:  Filed Vitals:   10/16/14 1430  BP: 130/60  Pulse: 59  Temp: 97.9 F (36.6 C)  Resp: 14     General appearance: alert, cooperative and appears stated age Ears: normal TM's and external ear canals both ears Throat: lips, mucosa, and tongue normal; teeth and gums normal Neck: no adenopathy, no carotid bruit, supple, symmetrical, trachea midline and thyroid not enlarged, symmetric, no tenderness/mass/nodules Back: symmetric, no curvature. ROM normal. No CVA tenderness. Lungs: clear to auscultation bilaterally Heart: regular rate and rhythm, S1, S2 normal, no murmur, click, rub or gallop Abdomen: soft, non-tender; bowel sounds normal; no masses,  no organomegaly Pulses: 2+ and symmetric Skin: Skin color, texture, turgor normal. No  rashes or lesions Lymph nodes: Cervical, supraclavicular, and axillary nodes normal. Neuro: CNs 2-12 intact. DTRs 2+/4 in biceps, brachioradialis, patellars and achilles. Muscle strength 5/5 in upper and lower exremities. Fine resting tremor bilaterally both hands cerebellar function normal. Romberg negative.  No pronator drift.   Gait normal.   Assessment and Plan:  Acute renal failure Prerenal secondary to dehydration from nausea and vomiting. Cr has improved on repeat done today   Lab Results  Component Value Date   CREATININE 1.44 10/16/2014      Vertigo, peripheral Etiology unclear,  May have been caused by inhaled irritant (glue).  Meclizine prescribed for next occurrence. Neuro exam is normal today.     Updated Medication List Outpatient Encounter Prescriptions as of 10/16/2014  Medication Sig  . aspirin 81 MG tablet Take by mouth. Takes 2 tablets daily.  . carvedilol (COREG) 6.25 MG tablet Take 1 tablet (6.25 mg total) by mouth 2 (two) times daily.  . isosorbide mononitrate (IMDUR) 60 MG 24 hr tablet Take 60 mg by mouth daily. 60 mg tablet daily.  Marland Kitchen losartan (COZAAR) 50 MG tablet TAKE 1 TABLET BY MOUTH EVERY DAY  . Multiple Vitamin (MULTIVITAMIN) tablet Take 1 tablet by mouth daily.    . nitroGLYCERIN (NITROSTAT) 0.4 MG SL tablet Place 1 tablet (0.4 mg total) under the tongue every 5 (five) minutes as needed.  . Omega-3 Fatty Acids (FISH OIL) 1200 MG CAPS Take by mouth daily.  Marland Kitchen omeprazole (PRILOSEC) 20 MG capsule TAKE 1 CAPSULE BY MOUTH EVERY OTHER MORNING AS DIRECTED  . RANEXA 1000 MG SR tablet TAKE 1 TABLET BY MOUTH TWICE DAILY (Patient taking differently: TAKE 1 TABLET BY MOUTH  DAILY)  . ranolazine (RANEXA) 1000 MG SR tablet Take 1,000 mg by mouth daily.  . simvastatin (ZOCOR) 20 MG tablet TAKE 1 TABLET BY MOUTH EVERY NIGHT AT BEDTIME  . tamsulosin (FLOMAX) 0.4 MG CAPS capsule Take 0.4 mg by mouth.  . zolpidem (AMBIEN) 10 MG tablet TAKE 1 TABLET BY MOUTH AT BEDTIME AS  NEEDED FOR SLEEP  . meclizine (ANTIVERT) 12.5 MG tablet Take 1 tablet (12.5 mg total) by mouth 3 (three) times daily as needed for dizziness.   No facility-administered encounter medications on file as of 10/16/2014.     Orders Placed This Encounter  Procedures  . Basic metabolic panel    Return in about 3 months (around 01/16/2015) for follow up diabetes.

## 2014-10-17 DIAGNOSIS — H81399 Other peripheral vertigo, unspecified ear: Secondary | ICD-10-CM | POA: Insufficient documentation

## 2014-10-17 NOTE — Assessment & Plan Note (Signed)
Prerenal secondary to dehydration from nausea and vomiting. Cr has improved on repeat done today   Lab Results  Component Value Date   CREATININE 1.44 10/16/2014

## 2014-10-17 NOTE — Assessment & Plan Note (Addendum)
Etiology unclear,  May have been caused by inhaled irritant (glue).  Meclizine prescribed for next occurrence. Neuro exam is normal today.

## 2014-10-18 ENCOUNTER — Encounter: Payer: Self-pay | Admitting: Internal Medicine

## 2014-10-18 DIAGNOSIS — M79671 Pain in right foot: Secondary | ICD-10-CM | POA: Diagnosis not present

## 2014-10-18 DIAGNOSIS — M7731 Calcaneal spur, right foot: Secondary | ICD-10-CM | POA: Diagnosis not present

## 2014-10-18 DIAGNOSIS — M722 Plantar fascial fibromatosis: Secondary | ICD-10-CM | POA: Diagnosis not present

## 2014-11-07 ENCOUNTER — Other Ambulatory Visit: Payer: Medicare Other

## 2014-11-08 DIAGNOSIS — M722 Plantar fascial fibromatosis: Secondary | ICD-10-CM | POA: Diagnosis not present

## 2014-11-08 DIAGNOSIS — M7731 Calcaneal spur, right foot: Secondary | ICD-10-CM | POA: Diagnosis not present

## 2014-11-09 ENCOUNTER — Ambulatory Visit: Payer: Medicare Other | Admitting: Internal Medicine

## 2014-11-15 ENCOUNTER — Ambulatory Visit: Payer: Medicare Other | Admitting: Cardiovascular Disease

## 2014-12-16 ENCOUNTER — Other Ambulatory Visit: Payer: Self-pay

## 2014-12-16 ENCOUNTER — Encounter: Payer: Self-pay | Admitting: Emergency Medicine

## 2014-12-16 ENCOUNTER — Emergency Department
Admission: EM | Admit: 2014-12-16 | Discharge: 2014-12-16 | Disposition: A | Discharge status: Home / Self Care | Payer: Medicare Other | Attending: Emergency Medicine | Admitting: Emergency Medicine

## 2014-12-16 DIAGNOSIS — I10 Essential (primary) hypertension: Secondary | ICD-10-CM | POA: Diagnosis not present

## 2014-12-16 DIAGNOSIS — Z7982 Long term (current) use of aspirin: Secondary | ICD-10-CM | POA: Diagnosis not present

## 2014-12-16 DIAGNOSIS — Z79899 Other long term (current) drug therapy: Secondary | ICD-10-CM | POA: Diagnosis not present

## 2014-12-16 DIAGNOSIS — H8110 Benign paroxysmal vertigo, unspecified ear: Secondary | ICD-10-CM

## 2014-12-16 DIAGNOSIS — E119 Type 2 diabetes mellitus without complications: Secondary | ICD-10-CM | POA: Insufficient documentation

## 2014-12-16 DIAGNOSIS — R42 Dizziness and giddiness: Secondary | ICD-10-CM | POA: Diagnosis not present

## 2014-12-16 DIAGNOSIS — R112 Nausea with vomiting, unspecified: Secondary | ICD-10-CM | POA: Insufficient documentation

## 2014-12-16 MED ORDER — DIAZEPAM 5 MG/ML IJ SOLN
INTRAMUSCULAR | Status: AC
  Administered 2014-12-16: 10 mg via INTRAVENOUS
  Filled 2014-12-16: qty 2

## 2014-12-16 MED ORDER — DIAZEPAM 5 MG/ML IJ SOLN
10.0000 mg | Freq: Once | INTRAMUSCULAR | Status: AC
  Administered 2014-12-16: 10 mg via INTRAVENOUS

## 2014-12-16 MED ORDER — DIAZEPAM 5 MG PO TABS: 5.0000 mg | ORAL_TABLET | Freq: Three times a day (TID) | ORAL | Status: DC | PRN

## 2014-12-16 MED ORDER — SODIUM CHLORIDE 0.9 % IV BOLUS (SEPSIS)
1000.0000 mL | Freq: Once | INTRAVENOUS | Status: AC
  Administered 2014-12-16: 1000 mL via INTRAVENOUS

## 2014-12-16 NOTE — ED Notes (Signed)
NAD noted at time of D/C. Pt taken to the lobby via wheelchair.

## 2014-12-16 NOTE — Discharge Instructions (Signed)
Epley Maneuver Self-Care WHAT IS THE EPLEY MANEUVER? The Epley maneuver is an exercise you can do to relieve symptoms of benign paroxysmal positional vertigo (BPPV). This condition is often just referred to as vertigo. BPPV is caused by the movement of tiny crystals (canaliths) inside your inner ear. The accumulation and movement of canaliths in your inner ear causes a sudden spinning sensation (vertigo) when you move your head to certain positions. Vertigo usually lasts about 30 seconds. BPPV usually occurs in just one ear. If you get vertigo when you lie on your left side, you probably have BPPV in your left ear. Your health care provider can tell you which ear is involved.  BPPV may be caused by a head injury. Many people older than 50 get BPPV for unknown reasons. If you have been diagnosed with BPPV, your health care provider may teach you how to do this maneuver. BPPV is not life threatening (benign) and usually goes away in time.  WHEN SHOULD I PERFORM THE EPLEY MANEUVER? You can do this maneuver at home whenever you have symptoms of vertigo. You may do the Epley maneuver up to 3 times a day until your symptoms of vertigo go away. HOW SHOULD I DO THE EPLEY MANEUVER?  Sit on the edge of a bed or table with your back straight. Your legs should be extended or hanging over the edge of the bed or table.   Turn your head halfway toward the affected ear.   Lie backward quickly with your head turned until you are lying flat on your back. You may want to position a pillow under your shoulders.   Hold this position for 30 seconds. You may experience an attack of vertigo. This is normal. Hold this position until the vertigo stops.  Then turn your head to the opposite direction until your unaffected ear is facing the floor.   Hold this position for 30 seconds. You may experience an attack of vertigo. This is normal. Hold this position until the vertigo stops.  Now turn your whole body to the same  side as your head. Hold for another 30 seconds.   You can then sit back up. ARE THERE RISKS TO THIS MANEUVER? In some cases, you may have other symptoms (such as changes in your vision, weakness, or numbness). If you have these symptoms, stop doing the maneuver and call your health care provider. Even if doing these maneuvers relieves your vertigo, you may still have dizziness. Dizziness is the sensation of light-headedness but without the sensation of movement. Even though the Epley maneuver may relieve your vertigo, it is possible that your symptoms will return within 5 years. WHAT SHOULD I DO AFTER THIS MANEUVER? After doing the Epley maneuver, you can return to your normal activities. Ask your doctor if there is anything you should do at home to prevent vertigo. This may include:  Sleeping with two or more pillows to keep your head elevated.  Not sleeping on the side of your affected ear.  Getting up slowly from bed.  Avoiding sudden movements during the day.  Avoiding extreme head movement, like looking up or bending over.  Wearing a cervical collar to prevent sudden head movements. WHAT SHOULD I DO IF MY SYMPTOMS GET WORSE? Call your health care provider if your vertigo gets worse. Call your provider right way if you have other symptoms, including:   Nausea.  Vomiting.  Headache.  Weakness.  Numbness.  Vision changes. Document Released: 05/31/2013 Document Reviewed: 05/31/2013 [employer]  Patient Information 2015 [place]. This information is not intended to replace advice given to you by your health care provider. Make sure you discuss any questions you have with your health care provider.  Vertigo Vertigo means you feel like you or your surroundings are moving when they are not. Vertigo can be dangerous if it occurs when you are at work, driving, or performing difficult activities.  CAUSES  Vertigo occurs when there is a conflict of signals sent to your brain from  the visual and sensory systems in your body. There are many different causes of vertigo, including:  Infections, especially in the inner ear.  A bad reaction to a drug or misuse of alcohol and medicines.  Withdrawal from drugs or alcohol.  Rapidly changing positions, such as lying down or rolling over in bed.  A migraine headache.  Decreased blood flow to the brain.  Increased pressure in the brain from a head injury, infection, tumor, or bleeding. SYMPTOMS  You may feel as though the world is spinning around or you are falling to the ground. Because your balance is upset, vertigo can cause nausea and vomiting. You may have involuntary eye movements (nystagmus). DIAGNOSIS  Vertigo is usually diagnosed by physical exam. If the cause of your vertigo is unknown, your caregiver may perform imaging tests, such as an MRI scan (magnetic resonance imaging). TREATMENT  Most cases of vertigo resolve on their own, without treatment. Depending on the cause, your caregiver may prescribe certain medicines. If your vertigo is related to body position issues, your caregiver may recommend movements or procedures to correct the problem. In rare cases, if your vertigo is caused by certain inner ear problems, you may need surgery. HOME CARE INSTRUCTIONS   Follow your caregiver's instructions.  Avoid driving.  Avoid operating heavy machinery.  Avoid performing any tasks that would be dangerous to you or others during a vertigo episode.  Tell your caregiver if you notice that certain medicines seem to be causing your vertigo. Some of the medicines used to treat vertigo episodes can actually make them worse in some people. SEEK IMMEDIATE MEDICAL CARE IF:   Your medicines do not relieve your vertigo or are making it worse.  You develop problems with talking, walking, weakness, or using your arms, hands, or legs.  You develop severe headaches.  Your nausea or vomiting continues or gets worse.  You  develop visual changes.  A family member notices behavioral changes.  Your condition gets worse. MAKE SURE YOU:  Understand these instructions.  Will watch your condition.  Will get help right away if you are not doing well or get worse. Document Released: 03/05/2005 Document Revised: 08/18/2011 Document Reviewed: 12/12/2010 [facility] Patient Information 2015 [place], [place]. This information is not intended to replace advice given to you by your health care provider. Make sure you discuss any questions you have with your health care provider.      Please call the number provided to follow up with ENT as soon as possible. Take your Valium as needed, as prescribed for any further dizziness or nausea. Return to the emergency department for any focal weakness or numbness, headache, slurred speech, confusion, or worsening vertigo/dizziness symptoms.

## 2014-12-16 NOTE — ED Provider Notes (Signed)
[facility] Emergency Department Provider Note  Time seen: 10:18 AM  I have reviewed the triage vital signs and the nursing notes.   HISTORY  Chief Complaint Dizziness    HPI Johnathan Arnold. is a 69 y.o. male with a past medical history of diabetes, hypertension, hyperlipidemia, status post 5 vessel CABG who presents the emergency department with acute onset of nausea, vomiting, dizziness. This is the patient's third episode in 2 months of vertigo-like symptoms. Patient states this episode occurred when he sat up quickly in bed became acutely dizzy, describes the room spinning around him becoming nauseated and vomiting. The last episode occurred approximately 3 weeks ago and then 5 weeks ago prior to that. In between episodes the patient states he is completely fine with no symptoms and feels normal. States with each of the 3 episodes they have begun with an acute/sudden change in position.  Patient currently describes his nausea as severe, dizziness as severe. Denies any headache, focal weakness or numbness, chest pain or trouble breathing.     Past Medical History  Diagnosis Date  . 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 Diabetes mellitus without complication   . History of cardiac catheterization 2011    [facility]  . 3-vessel coronary artery disease     s/p  5 vessel CABG  . Hypertension   . Hypertriglyceridemia   . Hyperlipidemia     Patient Active Problem List   Diagnosis Date Noted  . Vertigo, peripheral 10/17/2014  . Acute renal failure 05/02/2014  . Benign prostatic hypertrophy with urinary frequency 01/31/2014  . Encounter for preventive health examination 07/02/2013  . Change in bowel habits 07/02/2013  . Obesity 04/03/2013  . Other malaise and fatigue 09/21/2012  . Hyperlipidemia   . Hypertension   . 3-vessel coronary artery disease   . S/P CABG x 5   . Hypertriglyceridemia   . Diabetes mellitus type 2, controlled 05/21/2011  . Angina pectoris  associated with type 2 diabetes mellitus 05/21/2011    Past Surgical History  Procedure Laterality Date  . Coronary artery bypass graft  03/1998    5 vessel, [facility]  . Cardiac catheterization  05-19-2010    [facility]: Patent grafts. LIMA to LAD, SVG to D1, OM1 and RPDA    Current Outpatient Rx  Name  Route  Sig  Dispense  Refill  . aspirin 81 MG tablet   Oral   Take by mouth. Takes 2 tablets daily.         . carvedilol (COREG) 6.25 MG tablet   Oral   Take 1 tablet (6.25 mg total) by mouth 2 (two) times daily.   180 tablet   3   . isosorbide mononitrate (IMDUR) 60 MG 24 hr tablet   Oral   Take 60 mg by mouth daily. 60 mg tablet daily.         Marland Kitchen losartan (COZAAR) 50 MG tablet      TAKE 1 TABLET BY MOUTH EVERY DAY   90 tablet   0   . meclizine (ANTIVERT) 12.5 MG tablet   Oral   Take 1 tablet (12.5 mg total) by mouth 3 (three) times daily as needed for dizziness.   30 tablet   0   . Multiple Vitamin (MULTIVITAMIN) tablet   Oral   Take 1 tablet by mouth daily.           . nitroGLYCERIN (NITROSTAT) 0.4 MG SL tablet   Sublingual   Place 1  tablet (0.4 mg total) under the tongue every 5 (five) minutes as needed.   30 tablet   3   . Omega-3 Fatty Acids (FISH OIL) 1200 MG CAPS   Oral   Take by mouth daily.         Marland Kitchen omeprazole (PRILOSEC) 20 MG capsule      TAKE 1 CAPSULE BY MOUTH EVERY OTHER MORNING AS DIRECTED         . RANEXA 1000 MG SR tablet      TAKE 1 TABLET BY MOUTH TWICE DAILY Patient taking differently: TAKE 1 TABLET BY MOUTH  DAILY   60 tablet   3   . ranolazine (RANEXA) 1000 MG SR tablet   Oral   Take 1,000 mg by mouth daily.         . simvastatin (ZOCOR) 20 MG tablet      TAKE 1 TABLET BY MOUTH EVERY NIGHT AT BEDTIME   90 tablet   3   . tamsulosin (FLOMAX) 0.4 MG CAPS capsule   Oral   Take 0.4 mg by mouth.         . zolpidem (AMBIEN) 10 MG tablet      TAKE 1 TABLET BY MOUTH AT BEDTIME AS NEEDED FOR SLEEP   30 tablet    2     Allergies Review of patient's allergies indicates no known allergies.  Family History  Problem Relation Age of Onset  . Heart attack Mother 12  . Hypertension Mother   . Heart attack Father 58  . Heart disease Father   . Heart disease Brother     Social History History  Substance Use Topics  . Smoking status: Never Smoker   . Smokeless tobacco: Never Used  . Alcohol Use: No    Review of Systems Constitutional: Negative for fever. Positive for dizziness.  Cardiovascular: Negative for chest pain. Respiratory: Negative for shortness of breath. Gastrointestinal: Negative for abdominal pain positive for nausea and vomiting. Musculoskeletal: Negative for back pain. Neurological: Negative for headaches, focal weakness or numbness.  10-point ROS otherwise negative.  ____________________________________________   PHYSICAL EXAM:  VITAL SIGNS: ED Triage Vitals  Enc Vitals Group     BP 12/16/14 1004 188/65 mmHg     Pulse Rate 12/16/14 1004 54     Resp 12/16/14 1004 12     Temp 12/16/14 1004 97.7 F (36.5 C)     Temp src --      SpO2 12/16/14 1004 98 %     Weight 12/16/14 1004 192 lb (87.091 kg)     Height 12/16/14 1004 5\' 7"  (1.702 m)     Head Cir --      Peak Flow --      Pain Score --      Pain Loc --      Pain Edu? --      Excl. in [place]? --     Constitutional: Alert and oriented. Moderate distress due to nausea and vomiting Eyes: 2 mm, PERRL bilaterally, patient does have moderate torsional nystagmus bilaterally ENT   Head: Normocephalic and atraumatic   Mouth/Throat: Mucous membranes are moist. Cardiovascular: Normal rate, regular rhythm. No murmur Respiratory: Normal respiratory effort without tachypnea nor retractions. Breath sounds are clear and equal bilaterally. No wheezes/rales/rhonchi. Gastrointestinal: Soft and nontender. No distention.   Musculoskeletal: Nontender with normal range of motion in all extremities. No lower extremity  tenderness or edema. CABG scars present Neurologic:  Normal speech and language. No gross focal neurologic deficits  are appreciated. Speech is normal. 5/5 motor in all extremities, intact sensation. Skin:  Skin is warm Psychiatric: Nausea, otherwise normal appearance and behavior.  ____________________________________________    EKG  EKG reviewed and interpreted by myself shows sinus bradycardia at 54 bpm, narrow QRS, normal axis, normal intervals, no ST changes noted.  ____________________________________________     INITIAL IMPRESSION / ASSESSMENT AND PLAN / ED COURSE  Pertinent labs & imaging results that were available during my care of the patient were reviewed by me and considered in my medical decision making (see chart for details).  Patient with symptoms very consistent with BPPV. Attempted Epley maneuver, however patient became acutely nauseated and vomited during maneuver. We will dose Valium, IV fluids, and monitor the patient in the emergency department. No neurologic deficits on exam. Do not suspect CVA given her intermittent symptoms that occur with acute change in positions.   Patient states he feels much better after Valium. He is asking to try to go home. I discussed strict return precautions with the patient which she is agreeable. Patient follow-up with ENT with Dr. Pryor Ochoa which she has seen in the past. We will discharge patient home.  ____________________________________________   FINAL CLINICAL IMPRESSION(S) / ED DIAGNOSES   Benign paroxysmal positional vertigo   Harvest Dark, MD 12/16/14 1131

## 2014-12-16 NOTE — ED Notes (Signed)
MD at bedside. 

## 2014-12-16 NOTE — ED Notes (Signed)
Per EMS pt was dx with vertigo 2 mos ago at this ED. Pt states he was vomiting bile this morning and was unable to get comfortable in the bed d/t dizziness in his head. Pt states this is his 3rd episode of vertigo since his dx. Pt was given 2mg  zofran IM.

## 2015-01-08 DIAGNOSIS — H903 Sensorineural hearing loss, bilateral: Secondary | ICD-10-CM | POA: Diagnosis not present

## 2015-01-09 ENCOUNTER — Other Ambulatory Visit: Payer: Self-pay | Admitting: Otolaryngology

## 2015-01-09 DIAGNOSIS — R42 Dizziness and giddiness: Secondary | ICD-10-CM | POA: Diagnosis not present

## 2015-01-15 ENCOUNTER — Ambulatory Visit
Admission: RE | Admit: 2015-01-15 | Discharge: 2015-01-15 | Disposition: A | Discharge status: Home / Self Care | Payer: Medicare Other | Source: Ambulatory Visit | Attending: Otolaryngology | Admitting: Otolaryngology

## 2015-01-15 DIAGNOSIS — I679 Cerebrovascular disease, unspecified: Secondary | ICD-10-CM | POA: Insufficient documentation

## 2015-01-15 DIAGNOSIS — I6523 Occlusion and stenosis of bilateral carotid arteries: Secondary | ICD-10-CM | POA: Diagnosis not present

## 2015-01-15 DIAGNOSIS — H7491 Unspecified disorder of right middle ear and mastoid: Secondary | ICD-10-CM | POA: Insufficient documentation

## 2015-01-15 DIAGNOSIS — G319 Degenerative disease of nervous system, unspecified: Secondary | ICD-10-CM | POA: Insufficient documentation

## 2015-01-15 DIAGNOSIS — R42 Dizziness and giddiness: Secondary | ICD-10-CM

## 2015-01-15 MED ORDER — GADOBENATE DIMEGLUMINE 529 MG/ML IV SOLN
20.0000 mL | Freq: Once | INTRAVENOUS | Status: AC | PRN
  Administered 2015-01-15: 18 mL via INTRAVENOUS

## 2015-01-19 ENCOUNTER — Other Ambulatory Visit: Payer: Self-pay | Admitting: Otolaryngology

## 2015-01-19 ENCOUNTER — Other Ambulatory Visit: Payer: Self-pay | Admitting: Internal Medicine

## 2015-01-19 ENCOUNTER — Telehealth: Payer: Self-pay | Admitting: Cardiovascular Disease

## 2015-01-19 DIAGNOSIS — H903 Sensorineural hearing loss, bilateral: Secondary | ICD-10-CM | POA: Diagnosis not present

## 2015-01-19 DIAGNOSIS — R42 Dizziness and giddiness: Secondary | ICD-10-CM | POA: Diagnosis not present

## 2015-01-19 NOTE — Telephone Encounter (Signed)
Forward to [name]

## 2015-01-19 NOTE — Telephone Encounter (Signed)
Dr. Pryor Ochoa wants to make sure HCTZ 25 mg po daily is ok with [name] to add please call [facility]

## 2015-01-20 NOTE — Telephone Encounter (Signed)
If we add HCTZ then we should stop Losartan so that we don't drop his BP too much. He has to stay well hydrated and check BMP 1 week after starting HCTZ.

## 2015-01-22 ENCOUNTER — Telehealth: Payer: Self-pay

## 2015-01-22 ENCOUNTER — Other Ambulatory Visit: Payer: Self-pay

## 2015-01-22 DIAGNOSIS — N179 Acute kidney failure, unspecified: Secondary | ICD-10-CM

## 2015-01-22 NOTE — Telephone Encounter (Signed)
lmov for [place] to schedule the lab apt

## 2015-01-22 NOTE — Telephone Encounter (Signed)
Left message on nurse line at [facility] for [facility]

## 2015-01-22 NOTE — Telephone Encounter (Signed)
S/w [place] at [facility] States pt was seen Friday, c/o dizziness. MD there recommended HCTZ 25mg . Informed Sandy of Dr. Tyrell Antonio recommendations. Will have scheduling call pt to set up lab time next week.

## 2015-01-22 NOTE — Telephone Encounter (Signed)
S/w pt regarding Dr. Tyrell Antonio recommendation to stop losartan when pt begins taking HCTZ with f/u labs 8/23 Pt verbalized understanding and states he will start HCTZ tomorrow since he has taken losartan today. Agreeable to labs  8/23 at 8:45am  Left message for [place] at [facility] that we have set up labs 8/23

## 2015-01-24 ENCOUNTER — Ambulatory Visit
Admission: RE | Admit: 2015-01-24 | Discharge: 2015-01-24 | Disposition: A | Discharge status: Home / Self Care | Payer: Medicare Other | Source: Ambulatory Visit | Attending: Otolaryngology | Admitting: Otolaryngology

## 2015-01-24 DIAGNOSIS — H7011 Chronic mastoiditis, right ear: Secondary | ICD-10-CM | POA: Diagnosis not present

## 2015-01-24 DIAGNOSIS — H70892 Other mastoiditis and related conditions, left ear: Secondary | ICD-10-CM | POA: Insufficient documentation

## 2015-01-24 DIAGNOSIS — R42 Dizziness and giddiness: Secondary | ICD-10-CM

## 2015-01-30 ENCOUNTER — Other Ambulatory Visit (INDEPENDENT_AMBULATORY_CARE_PROVIDER_SITE_OTHER): Payer: Medicare Other

## 2015-01-30 DIAGNOSIS — N179 Acute kidney failure, unspecified: Secondary | ICD-10-CM | POA: Diagnosis not present

## 2015-01-31 LAB — BASIC METABOLIC PANEL
BUN/Creatinine Ratio: 12 (ref 10–22)
BUN: 17 mg/dL (ref 8–27)
CHLORIDE: 100 mmol/L (ref 97–108)
CO2: 25 mmol/L (ref 18–29)
Calcium: 9.6 mg/dL (ref 8.6–10.2)
Creatinine, Ser: 1.37 mg/dL — ABNORMAL HIGH (ref 0.76–1.27)
GFR calc non Af Amer: 52 mL/min/{1.73_m2} — ABNORMAL LOW (ref >59)
GFR, EST AFRICAN AMERICAN: 60 mL/min/{1.73_m2} (ref >59)
GLUCOSE: 146 mg/dL — AB (ref 65–99)
POTASSIUM: 5.1 mmol/L (ref 3.5–5.2)
SODIUM: 142 mmol/L (ref 134–144)

## 2015-02-05 ENCOUNTER — Encounter: Payer: Self-pay | Admitting: Cardiovascular Disease

## 2015-02-05 ENCOUNTER — Telehealth: Payer: Self-pay | Admitting: *Deleted

## 2015-02-05 ENCOUNTER — Other Ambulatory Visit (INDEPENDENT_AMBULATORY_CARE_PROVIDER_SITE_OTHER): Payer: Medicare Other

## 2015-02-05 DIAGNOSIS — E785 Hyperlipidemia, unspecified: Secondary | ICD-10-CM

## 2015-02-05 DIAGNOSIS — E119 Type 2 diabetes mellitus without complications: Secondary | ICD-10-CM

## 2015-02-05 LAB — LIPID PANEL
Cholesterol: 131 mg/dL (ref 0–200)
HDL: 46.8 mg/dL (ref >39.00)
LDL Cholesterol: 53 mg/dL (ref 0–99)
NonHDL: 84.54
TRIGLYCERIDES: 156 mg/dL — AB (ref 0.0–149.0)
Total CHOL/HDL Ratio: 3
VLDL: 31.2 mg/dL (ref 0.0–40.0)

## 2015-02-05 LAB — COMPREHENSIVE METABOLIC PANEL
ALT: 31 U/L (ref 0–53)
AST: 22 U/L (ref 0–37)
Albumin: 4.6 g/dL (ref 3.5–5.2)
Alkaline Phosphatase: 50 U/L (ref 39–117)
BUN: 16 mg/dL (ref 6–23)
CHLORIDE: 102 meq/L (ref 96–112)
CO2: 30 meq/L (ref 19–32)
Calcium: 9.7 mg/dL (ref 8.4–10.5)
Creatinine, Ser: 1.54 mg/dL — ABNORMAL HIGH (ref 0.40–1.50)
GFR: 47.7 mL/min — ABNORMAL LOW (ref >60.00)
Glucose, Bld: 148 mg/dL — ABNORMAL HIGH (ref 70–99)
Potassium: 4.4 mEq/L (ref 3.5–5.1)
Sodium: 140 mEq/L (ref 135–145)
Total Bilirubin: 0.7 mg/dL (ref 0.2–1.2)
Total Protein: 6.8 g/dL (ref 6.0–8.3)

## 2015-02-05 LAB — HEMOGLOBIN A1C: Hgb A1c MFr Bld: 6.4 % (ref 4.6–6.5)

## 2015-02-05 NOTE — Telephone Encounter (Signed)
Labs and dx?  

## 2015-02-06 LAB — LDL CHOLESTEROL, DIRECT: LDL DIRECT: 57 mg/dL

## 2015-02-07 ENCOUNTER — Ambulatory Visit: Payer: Medicare Other | Admitting: Internal Medicine

## 2015-02-08 ENCOUNTER — Encounter: Payer: Self-pay | Admitting: Internal Medicine

## 2015-02-10 ENCOUNTER — Encounter: Payer: Self-pay | Admitting: Internal Medicine

## 2015-02-14 ENCOUNTER — Other Ambulatory Visit: Payer: Self-pay | Admitting: Cardiovascular Disease

## 2015-02-14 ENCOUNTER — Telehealth: Payer: Self-pay | Admitting: *Deleted

## 2015-02-14 ENCOUNTER — Other Ambulatory Visit: Payer: Self-pay | Admitting: Internal Medicine

## 2015-02-14 DIAGNOSIS — N179 Acute kidney failure, unspecified: Secondary | ICD-10-CM

## 2015-02-14 NOTE — Telephone Encounter (Signed)
Patient has requested a lab appt for kidney function. Please advise if he should have a appt made. -Thanks

## 2015-02-14 NOTE — Telephone Encounter (Signed)
He can wait until sept 21 if he prefers, but if he is anxious i have ordered it separately to be done a week after stopping the hctZ

## 2015-02-14 NOTE — Telephone Encounter (Signed)
Patient has an OV on 02/28/15 do you need Kidney function before.

## 2015-02-14 NOTE — Telephone Encounter (Signed)
Please schedule lab for one week

## 2015-02-23 ENCOUNTER — Other Ambulatory Visit (INDEPENDENT_AMBULATORY_CARE_PROVIDER_SITE_OTHER): Payer: Medicare Other

## 2015-02-23 DIAGNOSIS — N179 Acute kidney failure, unspecified: Secondary | ICD-10-CM | POA: Diagnosis not present

## 2015-02-23 LAB — BASIC METABOLIC PANEL
BUN: 17 mg/dL (ref 6–23)
CHLORIDE: 103 meq/L (ref 96–112)
CO2: 28 mEq/L (ref 19–32)
Calcium: 9.7 mg/dL (ref 8.4–10.5)
Creatinine, Ser: 1.58 mg/dL — ABNORMAL HIGH (ref 0.40–1.50)
GFR: 46.3 mL/min — ABNORMAL LOW (ref >60.00)
GLUCOSE: 121 mg/dL — AB (ref 70–99)
POTASSIUM: 4.7 meq/L (ref 3.5–5.1)
Sodium: 140 mEq/L (ref 135–145)

## 2015-02-27 ENCOUNTER — Other Ambulatory Visit: Payer: Self-pay | Admitting: Internal Medicine

## 2015-02-27 ENCOUNTER — Ambulatory Visit (INDEPENDENT_AMBULATORY_CARE_PROVIDER_SITE_OTHER): Payer: Medicare Other | Admitting: Urology

## 2015-02-27 ENCOUNTER — Encounter: Payer: Self-pay | Admitting: Urology

## 2015-02-27 VITALS — BP 160/79 | HR 58 | Ht 67.0 in | Wt 196.8 lb

## 2015-02-27 DIAGNOSIS — I2510 Atherosclerotic heart disease of native coronary artery without angina pectoris: Secondary | ICD-10-CM

## 2015-02-27 DIAGNOSIS — N480 Leukoplakia of penis: Secondary | ICD-10-CM

## 2015-02-27 DIAGNOSIS — Z125 Encounter for screening for malignant neoplasm of prostate: Secondary | ICD-10-CM

## 2015-02-27 DIAGNOSIS — R350 Frequency of micturition: Secondary | ICD-10-CM | POA: Diagnosis not present

## 2015-02-27 DIAGNOSIS — R944 Abnormal results of kidney function studies: Secondary | ICD-10-CM

## 2015-02-27 DIAGNOSIS — N401 Enlarged prostate with lower urinary tract symptoms: Secondary | ICD-10-CM

## 2015-02-27 LAB — URINALYSIS, COMPLETE
BILIRUBIN UA: NEGATIVE
Glucose, UA: NEGATIVE
KETONES UA: NEGATIVE
LEUKOCYTES UA: NEGATIVE
Nitrite, UA: NEGATIVE
PROTEIN UA: NEGATIVE
RBC UA: NEGATIVE
Specific Gravity, UA: 1.025 (ref 1.005–1.030)
Urobilinogen, Ur: 0.2 mg/dL (ref 0.2–1.0)
pH, UA: 5.5 (ref 5.0–7.5)

## 2015-02-27 LAB — MICROSCOPIC EXAMINATION
Bacteria, UA: NONE SEEN
EPITHELIAL CELLS (NON RENAL): NONE SEEN /HPF (ref 0–10)
RBC MICROSCOPIC, UA: NONE SEEN /HPF (ref 0–?)

## 2015-02-27 LAB — BLADDER SCAN AMB NON-IMAGING

## 2015-02-27 NOTE — Progress Notes (Signed)
02/27/2015 10:36 AM   Johnathan Arnold 1945-07-03 [id number]  Referring provider: [name], MD [place], [place] [address]  Chief Complaint  Patient presents with  . Benign Prostatic Hypertrophy    28month follow up     HPI: The patient is a 69 year old male who presents for follow-up of his BX O. The patient underwent a penile biopsy, cystoscopy, meatal dilation on 03/20/2014 for penile lesions and spraying with urination. Intraoperatively he was noted to have meatal stenosis consistent with BX O required dilation in the has been no additional urethral strictures. Pathology showed evidence of lichen sclerosis. He reports a good stream now and is using clobestol. He continues to use his [place] medication once per week. She denies any dysuria or hematuria. He reports nocturia 1. He feels like his abdomen is bladder. He is on Flomax.     PMH: Past Medical History  Diagnosis Date  . 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 Diabetes mellitus without complication   . History of cardiac catheterization 2011    [facility]  . 3-vessel coronary artery disease     s/p  5 vessel CABG  . Hypertension   . Hypertriglyceridemia   . Hyperlipidemia     Surgical History: Past Surgical History  Procedure Laterality Date  . Coronary artery bypass graft  03/1998    5 vessel, [facility]  . Cardiac catheterization  05-19-2010    [facility]: Patent grafts. LIMA to LAD, SVG to D1, OM1 and RPDA    Home Medications:    Medication List       This list is accurate as of: 02/27/15 10:36 AM.  Always use your most recent med list.               aspirin 81 MG tablet  Take by mouth. Takes 2 tablets daily.     carvedilol 6.25 MG tablet  Commonly known as:  COREG  TAKE 1 TABLET BY MOUTH TWICE DAILY     diazepam 5 MG tablet  Commonly known as:  VALIUM  Take 1 tablet (5 mg total) by mouth every 8 (eight) hours as needed for anxiety.     Fish Oil 1200 MG Caps  Take by mouth daily.     isosorbide mononitrate 60 MG 24 hr tablet  Commonly known as:  IMDUR  Take 60 mg by mouth daily. 60 mg tablet daily.     losartan 50 MG tablet  Commonly known as:  COZAAR  TAKE 1 TABLET BY MOUTH EVERY DAY     multivitamin tablet  Take 1 tablet by mouth daily.     nitroGLYCERIN 0.4 MG SL tablet  Commonly known as:  NITROSTAT  Place 1 tablet (0.4 mg total) under the tongue every 5 (five) minutes as needed.     omeprazole 20 MG capsule  Commonly known as:  PRILOSEC  TAKE 1 CAPSULE BY MOUTH EVERY OTHER MORNING AS DIRECTED     RANEXA 1000 MG SR tablet  Generic drug:  ranolazine  TAKE 1 TABLET BY MOUTH TWICE DAILY     simvastatin 20 MG tablet  Commonly known as:  ZOCOR  TAKE 1 TABLET BY MOUTH EVERY NIGHT AT BEDTIME     tamsulosin 0.4 MG Caps capsule  Commonly known as:  FLOMAX  Take 0.4 mg by mouth.     zolpidem 10 MG tablet  Commonly known as:  AMBIEN  TAKE 1 TABLET BY MOUTH AT BEDTIME AS NEEDED FOR SLEEP  Allergies: No Known Allergies  Family History: Family History  Problem Relation Age of Onset  . Heart attack Mother 45  . Hypertension Mother   . Heart attack Father 54  . Heart disease Father   . Heart disease Brother     Social History:  reports that he has never smoked. He has never used smokeless tobacco. He reports that he does not drink alcohol or use illicit drugs.  ROS: UROLOGY Frequent Urination?: No Hard to postpone urination?: No Burning/pain with urination?: No Get up at night to urinate?: No Leakage of urine?: No Urine stream starts and stops?: No Trouble starting stream?: No Do you have to strain to urinate?: No Blood in urine?: No Urinary tract infection?: No Sexually transmitted disease?: No Injury to kidneys or bladder?: No Painful intercourse?: No Weak stream?: No Erection problems?: No Penile pain?: No  Gastrointestinal Nausea?: No Vomiting?: No Indigestion/heartburn?: No Diarrhea?: No Constipation?:  No  Constitutional Fever: No Night sweats?: No Weight loss?: No Fatigue?: No  Skin Skin rash/lesions?: No Itching?: No  Eyes Blurred vision?: No Double vision?: No  Ears/Nose/Throat Sore throat?: No Sinus problems?: No  Hematologic/Lymphatic Swollen glands?: No Easy bruising?: No  Cardiovascular Leg swelling?: No Chest pain?: No  Respiratory Cough?: No Shortness of breath?: No  Endocrine Excessive thirst?: No  Musculoskeletal Back pain?: No Joint pain?: No  Neurological Headaches?: No Dizziness?: No  Psychologic Depression?: No Anxiety?: No  Physical Exam: BP 160/79 mmHg  Pulse 58  Ht 5\' 7"  (1.702 m)  Wt 196 lb 12.8 oz (89.268 kg)  BMI 30.82 kg/m2  Constitutional:  Alert and oriented, No acute distress. HEENT:  AT, moist mucus membranes.  Trachea midline, no masses. Cardiovascular: No clubbing, cyanosis, or edema. Respiratory: Normal respiratory effort, no increased work of breathing. GI: Abdomen is soft, nontender, nondistended, no abdominal masses GU: No CVA tenderness. Normal phallus. Previous incision well-healed. Urethral meatus patent. Testicles ascended bilaterally. No masses. Nontender to palpation. DRE: 2+, smooth no nodules. Skin: No rashes, bruises or suspicious lesions. Lymph: No cervical or inguinal adenopathy. Neurologic: Grossly intact, no focal deficits, moving all 4 extremities. Psychiatric: Normal mood and affect.  Laboratory Data: Lab Results  Component Value Date   WBC 8.7 10/11/2014   HGB 13.0 10/11/2014   HCT 38.0* 10/11/2014   MCV 93.7 10/11/2014   PLT 200 10/11/2014    Lab Results  Component Value Date   CREATININE 1.58* 02/23/2015    Lab Results  Component Value Date   PSA 1.08 08/01/2014   PSA 1.00 07/07/2013   PSA 1.42 06/23/2012    Lab Results  Component Value Date   TESTOSTERONE 281* 09/21/2012    Lab Results  Component Value Date   HGBA1C 6.4 02/05/2015    Urinalysis    Component Value  Date/Time   COLORURINE YELLOW* 10/11/2014 2330   COLORURINE Yellow 03/15/2014 1005   APPEARANCEUR CLEAR* 10/11/2014 2330   APPEARANCEUR Clear 03/15/2014 1005   LABSPEC 1.012 10/11/2014 2330   LABSPEC 1.009 03/15/2014 1005   PHURINE 5.0 10/11/2014 2330   PHURINE 6.0 03/15/2014 1005   GLUCOSEU NEGATIVE 10/11/2014 2330   GLUCOSEU Negative 03/15/2014 1005   HGBUR NEGATIVE 10/11/2014 2330   HGBUR Negative 03/15/2014 1005   BILIRUBINUR NEGATIVE 10/11/2014 2330   BILIRUBINUR Negative 03/15/2014 1005   BILIRUBINUR small 03/16/2012 1630   KETONESUR TRACE* 10/11/2014 2330   KETONESUR Negative 03/15/2014 1005   PROTEINUR NEGATIVE 10/11/2014 2330   PROTEINUR Negative 03/15/2014 1005   PROTEINUR 100mg  03/16/2012 1630  UROBILINOGEN 1.0 03/16/2012 1630   NITRITE NEGATIVE 10/11/2014 2330   NITRITE Negative 03/15/2014 1005   NITRITE neg 03/16/2012 1630   LEUKOCYTESUR TRACE* 10/11/2014 2330   LEUKOCYTESUR Negative 03/15/2014 1005    Pertinent Imaging: PVR: 27  Assessment & Plan:    1. Benign prostatic hypertrophy with urinary frequency -Continue Flomax 0.4 mg daily at bedtime - Urinalysis, Complete - BLADDER SCAN AMB NON-IMAGING  2. BXO (balanitis xerotica obliterans) -I discussed with the patient again that the clobestol will slow the disease progression. He will continue this medication on a weekly basis. He will return for PVR in one year's time.  3.  Prostate cancer screening -I discussed with the patient the relative controversy behind PSA testing. He is aware that the guidelines have conflicting recommendations ranging from do not test to testing in healthy patients. He is also aware that PSA testing may lead to the over diagnosis in over treatment of some prostate cancers. He would still like to proceed with this test.  We will follow up his PSA result.   Return in about 1 year (around 02/27/2016) for PSA, DRE, symptom check.  Nickie Retort, MD  [facility] [address], [place], [place] [address] [phone]

## 2015-02-28 ENCOUNTER — Encounter: Payer: Self-pay | Admitting: Internal Medicine

## 2015-02-28 ENCOUNTER — Ambulatory Visit (INDEPENDENT_AMBULATORY_CARE_PROVIDER_SITE_OTHER): Payer: Medicare Other | Admitting: Internal Medicine

## 2015-02-28 VITALS — BP 126/62 | HR 56 | Temp 98.0°F | Resp 12 | Ht 67.0 in | Wt 196.1 lb

## 2015-02-28 DIAGNOSIS — I2510 Atherosclerotic heart disease of native coronary artery without angina pectoris: Secondary | ICD-10-CM | POA: Diagnosis not present

## 2015-02-28 DIAGNOSIS — Z23 Encounter for immunization: Secondary | ICD-10-CM

## 2015-02-28 DIAGNOSIS — H81393 Other peripheral vertigo, bilateral: Secondary | ICD-10-CM | POA: Diagnosis not present

## 2015-02-28 DIAGNOSIS — E1121 Type 2 diabetes mellitus with diabetic nephropathy: Secondary | ICD-10-CM | POA: Diagnosis not present

## 2015-02-28 DIAGNOSIS — N179 Acute kidney failure, unspecified: Secondary | ICD-10-CM

## 2015-02-28 DIAGNOSIS — I10 Essential (primary) hypertension: Secondary | ICD-10-CM

## 2015-02-28 LAB — PSA: Prostate Specific Ag, Serum: 1.1 ng/mL (ref 0.0–4.0)

## 2015-02-28 NOTE — Progress Notes (Signed)
Subjective:  Patient ID: Johnathan Arnold., male    DOB: [birth year]  Age: 69 y.o. MRN: [id number]  CC: The primary encounter diagnosis was Encounter for immunization. 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  HPI Johnathan Arnold. presents for  Diabetes follow up   1) DM: He feels generally well, is exercising several times per week and checking blood sugars once daily at variable times.  BS have been under 130 fasting and < 150 post prandially.  Denies any recent hypoglyemic events.  Taking his medications as directed. Following a carbohydrate modified diet 6 days per week. Denies numbness, burning and tingling of extremities. Appetite is good.     2) He was evaluated by ENT for Recurrent vertigo  accompanied by severe nausea sthat started in April monthly and increased to every 2 weeks startiing  In July.  Vaught started hctz after ruling out CVA and mass with MRI brain. Dr Fletcher Anon reassessed renal function in July after starting daily diuretic and stopping losartan but a change was seen in 3 weeks later and hctz was d/c by me and losartan resumed.   He has not had a recurrence of vertigo but his  Cr did not improve, so his ARB was suspended yesterday   Last attack was August 26 .  Was Not on hctz at the time     Outpatient Prescriptions Prior to Visit  Medication Sig Dispense Refill  . aspirin 81 MG tablet Take by mouth. Takes 2 tablets daily.    . carvedilol (COREG) 6.25 MG tablet TAKE 1 TABLET BY MOUTH TWICE DAILY 180 tablet 3  . diazepam (VALIUM) 5 MG tablet Take 1 tablet (5 mg total) by mouth every 8 (eight) hours as needed for anxiety. 30 tablet 0  . isosorbide mononitrate (IMDUR) 60 MG 24 hr tablet Take 60 mg by mouth daily. 60 mg tablet daily.    . Multiple Vitamin (MULTIVITAMIN) tablet Take 1 tablet  by mouth daily.      . nitroGLYCERIN (NITROSTAT) 0.4 MG SL tablet Place 1 tablet (0.4 mg total) under the tongue every 5 (five) minutes as needed. 30 tablet 3  . Omega-3 Fatty Acids (FISH OIL) 1200 MG CAPS Take by mouth daily.    Marland Kitchen omeprazole (PRILOSEC) 20 MG capsule TAKE 1 CAPSULE BY MOUTH EVERY OTHER MORNING AS DIRECTED    . RANEXA 1000 MG SR tablet TAKE 1 TABLET BY MOUTH TWICE DAILY (Patient taking differently: TAKE 1 TABLET BY MOUTH  DAILY) 60 tablet 3  . simvastatin (ZOCOR) 20 MG tablet TAKE 1 TABLET BY MOUTH EVERY NIGHT AT BEDTIME 90 tablet 3  . tamsulosin (FLOMAX) 0.4 MG CAPS capsule Take 0.4 mg by mouth.    . zolpidem (AMBIEN) 10 MG tablet TAKE 1 TABLET BY MOUTH AT BEDTIME AS NEEDED FOR SLEEP 30 tablet 2  . losartan (COZAAR) 50 MG tablet TAKE 1 TABLET BY MOUTH EVERY DAY (Patient not taking: Reported on 02/28/2015) 90 tablet 0   No facility-administered medications prior to visit.    Review of Systems;  Patient denies headache, fevers, malaise, unintentional weight loss, skin rash, eye pain, sinus congestion and sinus pain, sore throat, dysphagia,  hemoptysis , cough, dyspnea, wheezing, chest pain, palpitations, orthopnea, edema, abdominal pain, nausea, melena, diarrhea, constipation, flank pain, dysuria, hematuria, urinary  Frequency, nocturia, numbness, tingling, seizures,  Focal weakness, Loss  of consciousness,  Tremor, insomnia, depression, anxiety, and suicidal ideation.      Objective:  BP 126/62 mmHg  Pulse 56  Temp(Src) 98 F (36.7 C) (Oral)  Resp 12  Ht 5\' 7"  (1.702 m)  Wt 196 lb 2 oz (88.962 kg)  BMI 30.71 kg/m2  SpO2 96%  BP Readings from Last 3 Encounters:  02/28/15 126/62  02/27/15 160/79  12/16/14 160/63    Wt Readings from Last 3 Encounters:  02/28/15 196 lb 2 oz (88.962 kg)  02/27/15 196 lb 12.8 oz (89.268 kg)  01/15/15 196 lb (88.905 kg)    General appearance: alert, cooperative and appears stated age Ears: normal TM's and external ear canals both  ears Throat: lips, mucosa, and tongue normal; teeth and gums normal Neck: no adenopathy, no carotid bruit, supple, symmetrical, trachea midline and thyroid not enlarged, symmetric, no tenderness/mass/nodules Back: symmetric, no curvature. ROM normal. No CVA tenderness. Lungs: clear to auscultation bilaterally Heart: regular rate and rhythm, S1, S2 normal, no murmur, click, rub or gallop Abdomen: soft, non-tender; bowel sounds normal; no masses,  no organomegaly Pulses: 2+ and symmetric Skin: Skin color, texture, turgor normal. No rashes or lesions Lymph nodes: Cervical, supraclavicular, and axillary nodes normal.  Lab Results  Component Value Date   HGBA1C 6.4 02/05/2015   HGBA1C 6.6* 08/01/2014   HGBA1C 6.4 04/28/2014    Lab Results  Component Value Date   CREATININE 1.58* 02/23/2015   CREATININE 1.54* 02/05/2015   CREATININE 1.37* 01/30/2015    Lab Results  Component Value Date   WBC 8.7 10/11/2014   HGB 13.0 10/11/2014   HCT 38.0* 10/11/2014   PLT 200 10/11/2014   GLUCOSE 121* 02/23/2015   CHOL 131 02/05/2015   TRIG 156.0* 02/05/2015   HDL 46.80 02/05/2015   LDLDIRECT 57.0 02/05/2015   LDLCALC 53 02/05/2015   ALT 31 02/05/2015   AST 22 02/05/2015   NA 140 02/23/2015   K 4.7 02/23/2015   CL 103 02/23/2015   CREATININE 1.58* 02/23/2015   BUN 17 02/23/2015   CO2 28 02/23/2015   TSH 3.48 01/30/2014   PSA 1.1 02/27/2015   HGBA1C 6.4 02/05/2015   MICROALBUR 2.0* 08/01/2014    Ct Temporal Bones W/o Cm  01/24/2015   CLINICAL DATA:  Dizziness for 5 months.  EXAM: CT TEMPORAL BONES WITHOUT CONTRAST  TECHNIQUE: Axial and coronal plane CT imaging of the petrous temporal bones was performed with thin-collimation image reconstruction. No intravenous contrast was administered. Multiplanar CT image reconstructions were also generated.  COMPARISON:  Brain MRI 01/15/2015  FINDINGS: RIGHT: The external auditory canal and tympanic membrane are unremarkable. The ossicles appear  intact. The tympanic cavity is clear. The internal auditory canal, cochlea, vestibule, and semicircular canals are unremarkable. Within the superolateral aspect of the mastoid bone corresponding to the enhancement on prior MRI is a region of heterogeneous sclerosis and some ground-glass attenuation measuring approximately 2 cm. No aggressive/ destructive osseous changes are seen, and the tegmen mastoideum is intact. There is opacification of a few mastoid air cells more inferiorly.  LEFT: The external auditory canal and tympanic membrane are unremarkable. The ossicles appear intact. The tympanic cavity is clear. The internal auditory canal, cochlea, vestibule, and semicircular canals are unremarkable. The mastoid air cells are clear.  There is minimal mucosal thickening in the maxillary sinuses. The visualized portion of the brain is unremarkable. Visualized orbits are unremarkable.  IMPRESSION: 1. 2 cm region of heterogeneous sclerosis in the superolateral right mastoid bone corresponding to  the enhancement on prior MRI. This is of uncertain etiology but has a benign/indolent appearance and is felt unlikely to be the cause of the patient's dizziness. Possible considerations include localized Paget's, fibrous dysplasia, osseous hemangioma, or sequelae of chronic inflammation/infection. 2. Otherwise unremarkable appearance of the temporal bones.   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 Mild proteinuria,  On ARB until now, p to date on eye and foot exams.  Lab Results  Component Value Date   HGBA1C 6.4 02/05/2015   Lab Results  Component Value Date   MICROALBUR 2.0* 08/01/2014           Hypertension    Recent change to carvedilol by Dr Fletcher Anon and addition of flomax by urology,  Losartan suspended today  for decrease in GFR.       Acute renal failure    Bump in Cr noted with use of losartan and hctz.  stopping both and repeat cr 2 weeks        Vertigo, peripheral    Etiology unclear,  May have been caused by inhaled irritant (glue).  Meclizine prescribed for next occurrence. Neuro exam is normal today and recent MRI ordered by ENT was  unrevealing. .          Other Visit Diagnoses    Encounter for immunization    -  Primary       I am having Mr. Malinowski maintain his multivitamin, aspirin, isosorbide mononitrate, Fish Oil, omeprazole, simvastatin, nitroGLYCERIN, tamsulosin, zolpidem, RANEXA, diazepam, losartan, carvedilol, and polyethylene glycol.  Meds ordered this encounter  Medications  . polyethylene glycol (MIRALAX / GLYCOLAX) packet    Sig: Take 17 g by mouth daily.    There are no discontinued medications.  Follow-up: No Follow-up on file.   [name], MD

## 2015-02-28 NOTE — Patient Instructions (Addendum)
Your diabetes remains under excellent control  And your cholesterol is fine    Please stop the hctz and the losartan for 2 weeks and we will recheck your kidney function

## 2015-02-28 NOTE — Progress Notes (Signed)
Pre-visit discussion using our clinic review tool. No additional management support is needed unless otherwise documented below in the visit note.  

## 2015-03-03 NOTE — Assessment & Plan Note (Signed)
Bump in Cr noted with use of losartan and hctz.  stopping both and repeat cr 2 weeks

## 2015-03-03 NOTE — Assessment & Plan Note (Signed)
managd with diet alone . Mild proteinuria,  On ARB until now, p to date on eye and foot exams.  Lab Results  Component Value Date   HGBA1C 6.4 02/05/2015   Lab Results  Component Value Date   MICROALBUR 2.0* 08/01/2014

## 2015-03-03 NOTE — Assessment & Plan Note (Signed)
Etiology unclear,  May have been caused by inhaled irritant (glue).  Meclizine prescribed for next occurrence. Neuro exam is normal today and recent MRI ordered by ENT was  unrevealing. Johnathan Arnold

## 2015-03-03 NOTE — Assessment & Plan Note (Signed)
Recent change to carvedilol by Dr Fletcher Anon and addition of flomax by urology,  Losartan suspended today for decrease in GFR.

## 2015-03-05 DIAGNOSIS — E119 Type 2 diabetes mellitus without complications: Secondary | ICD-10-CM | POA: Diagnosis not present

## 2015-03-05 LAB — HM DIABETES EYE EXAM

## 2015-03-13 ENCOUNTER — Encounter: Payer: Self-pay | Admitting: Internal Medicine

## 2015-03-14 ENCOUNTER — Other Ambulatory Visit (INDEPENDENT_AMBULATORY_CARE_PROVIDER_SITE_OTHER): Payer: Medicare Other

## 2015-03-14 ENCOUNTER — Ambulatory Visit (INDEPENDENT_AMBULATORY_CARE_PROVIDER_SITE_OTHER): Payer: Medicare Other

## 2015-03-14 VITALS — BP 120/70 | HR 58 | Resp 18

## 2015-03-14 DIAGNOSIS — I10 Essential (primary) hypertension: Secondary | ICD-10-CM

## 2015-03-14 DIAGNOSIS — R944 Abnormal results of kidney function studies: Secondary | ICD-10-CM

## 2015-03-14 LAB — BASIC METABOLIC PANEL
BUN: 15 mg/dL (ref 6–23)
CHLORIDE: 103 meq/L (ref 96–112)
CO2: 28 meq/L (ref 19–32)
Calcium: 9.4 mg/dL (ref 8.4–10.5)
Creatinine, Ser: 1.53 mg/dL — ABNORMAL HIGH (ref 0.40–1.50)
GFR: 48.04 mL/min — ABNORMAL LOW (ref >60.00)
GLUCOSE: 147 mg/dL — AB (ref 70–99)
POTASSIUM: 4.6 meq/L (ref 3.5–5.1)
SODIUM: 139 meq/L (ref 135–145)

## 2015-03-14 NOTE — Progress Notes (Addendum)
Patient came to office for BP check.  Patient has been off the Losartan for the past two weeks.  Patient states that he has been taking his BP at home and it is running in the 140's/60's.  He had one vertigo episode last week.  Vitals are documented from today for bilateral arms.  Please advise if any changes?   I have reviewed the above information and agree with above.   Will continue to suspend Losartan for two more weeks and and ask patient to bring home BP machine with him for direct comparison   Deborra Medina, MD

## 2015-03-15 ENCOUNTER — Encounter: Payer: Self-pay | Admitting: Internal Medicine

## 2015-03-15 NOTE — Addendum Note (Signed)
Addended by: [name] on: 03/15/2015 09:23 PM   Modules accepted: Orders

## 2015-03-28 ENCOUNTER — Other Ambulatory Visit: Payer: Self-pay | Admitting: Urology

## 2015-04-02 ENCOUNTER — Encounter: Payer: Self-pay | Admitting: Cardiovascular Disease

## 2015-04-02 ENCOUNTER — Ambulatory Visit (INDEPENDENT_AMBULATORY_CARE_PROVIDER_SITE_OTHER): Payer: Medicare Other | Admitting: Cardiovascular Disease

## 2015-04-02 VITALS — BP 116/62 | HR 51 | Ht 67.0 in | Wt 195.8 lb

## 2015-04-02 DIAGNOSIS — I6523 Occlusion and stenosis of bilateral carotid arteries: Secondary | ICD-10-CM | POA: Diagnosis not present

## 2015-04-02 DIAGNOSIS — I209 Angina pectoris, unspecified: Secondary | ICD-10-CM | POA: Diagnosis not present

## 2015-04-02 DIAGNOSIS — E1159 Type 2 diabetes mellitus with other circulatory complications: Secondary | ICD-10-CM

## 2015-04-02 DIAGNOSIS — I10 Essential (primary) hypertension: Secondary | ICD-10-CM

## 2015-04-02 DIAGNOSIS — I2510 Atherosclerotic heart disease of native coronary artery without angina pectoris: Secondary | ICD-10-CM

## 2015-04-02 DIAGNOSIS — E785 Hyperlipidemia, unspecified: Secondary | ICD-10-CM

## 2015-04-02 MED ORDER — RANOLAZINE ER 1000 MG PO TB12: 1000.0000 mg | ORAL_TABLET | Freq: Two times a day (BID) | ORAL | Status: DC

## 2015-04-02 NOTE — Patient Instructions (Signed)
Medication Instructions:  Your physician has recommended you make the following change in your medication:  INCREASE Ranexa to 1000mg  twice per day   Labwork: none  Testing/Procedures: Your physician has requested that you have a carotid duplex in 6 months. This test is an ultrasound of the carotid arteries in your neck. It looks at blood flow through these arteries that supply the brain with blood. Allow one hour for this exam. There are no restrictions or special instructions.    Follow-Up: Your physician wants you to follow-up in: six months with [name]. You will receive a reminder letter in the mail two months in advance. If you don't receive a letter, please call our office to schedule the follow-up appointment.   Any Other Special Instructions Will Be Listed Below (If Applicable).

## 2015-04-02 NOTE — Assessment & Plan Note (Signed)
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 I advised him to increase Ranexa to 1000 mg twice daily instead of once daily.

## 2015-04-02 NOTE — Progress Notes (Signed)
HPI  This is a pleasant 69 year old male who is here today for a followup visit.  He has known history of coronary artery disease status post CABG in 1999. He also has known history of hypertension, chronic kidney disease, hyperlipidemia and type 2 diabetes.He underwent a cardiac catheterization in 2011 which showed patent grafts. He continued to have mild exertional chest pain. A treadmill nuclear stress test in 06/2012 showed normal ejection fraction. Fixed inferior wall defect with normal wall motion and moderate reversibility in the septal wall.  He was started him on Ranexa with significant improvement in angina.  He reports stable anginal symptoms which happens only if he overexerts himself. He was switched from losartan to either to a thiazide to see if that helps with his dizziness. His kidney function slightly worsened and he had no improvement in symptoms. Thus, it was discontinued. He had carotid Doppler done which showed moderate bilateral disease and plaque formation   No Known Allergies   Current Outpatient Prescriptions on File Prior to Visit  Medication Sig Dispense Refill  . aspirin 81 MG tablet Take by mouth. Takes 2 tablets daily.    . carvedilol (COREG) 6.25 MG tablet TAKE 1 TABLET BY MOUTH TWICE DAILY 180 tablet 3  . diazepam (VALIUM) 5 MG tablet Take 1 tablet (5 mg total) by mouth every 8 (eight) hours as needed for anxiety. 30 tablet 0  . isosorbide mononitrate (IMDUR) 60 MG 24 hr tablet Take 60 mg by mouth daily. 60 mg tablet daily.    . Multiple Vitamin (MULTIVITAMIN) tablet Take 1 tablet by mouth daily.      . nitroGLYCERIN (NITROSTAT) 0.4 MG SL tablet Place 1 tablet (0.4 mg total) under the tongue every 5 (five) minutes as needed. 30 tablet 3  . Omega-3 Fatty Acids (FISH OIL) 1200 MG CAPS Take by mouth daily.    Marland Kitchen omeprazole (PRILOSEC) 20 MG capsule TAKE 1 CAPSULE BY MOUTH EVERY OTHER MORNING AS DIRECTED    . polyethylene glycol (MIRALAX / GLYCOLAX) packet Take 17  g by mouth daily.    Marland Kitchen RANEXA 1000 MG SR tablet TAKE 1 TABLET BY MOUTH TWICE DAILY (Patient taking differently: TAKE 1 TABLET BY MOUTH  DAILY) 60 tablet 3  . simvastatin (ZOCOR) 20 MG tablet TAKE 1 TABLET BY MOUTH EVERY NIGHT AT BEDTIME 90 tablet 3  . tamsulosin (FLOMAX) 0.4 MG CAPS capsule TAKE 1 CAPSULE BY MOUTH EVERY DAY 30 capsule 0  . zolpidem (AMBIEN) 10 MG tablet TAKE 1 TABLET BY MOUTH AT BEDTIME AS NEEDED FOR SLEEP 30 tablet 2  . losartan (COZAAR) 50 MG tablet TAKE 1 TABLET BY MOUTH EVERY DAY (Patient not taking: Reported on 02/28/2015) 90 tablet 0   No current facility-administered medications on file prior to visit.     Past Medical History  Diagnosis Date  . 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 Diabetes mellitus without complication ([place])   . History of cardiac catheterization 2011    [facility]  . 3-vessel coronary artery disease     s/p  5 vessel CABG  . Hypertension   . Hypertriglyceridemia   . Hyperlipidemia   . Vertigo      Past Surgical History  Procedure Laterality Date  . Coronary artery bypass graft  03/1998    5 vessel, [facility]  . Cardiac catheterization  05-19-2010    [facility]: Patent grafts. LIMA to LAD, SVG to D1, OM1 and RPDA     Family History  Problem Relation  Age of Onset  . Heart attack Mother 30  . Hypertension Mother   . Heart attack Father 10  . Heart disease Father   . Heart disease Brother      Social History   Social History  . Marital Status: Married    Spouse Name: N/A  . Number of Children: N/A  . Years of Education: N/A   Occupational History  . Not on file.   Social History Main Topics  . Smoking status: Never Smoker   . Smokeless tobacco: Never Used  . Alcohol Use: No  . Drug Use: No  . Sexual Activity: Not on file   Other Topics Concern  . Not on file   Social History Narrative        PHYSICAL EXAM   BP 116/62 mmHg  Pulse 51  Ht 5\' 7"  (1.702 m)  Wt 195 lb 12 oz (88.792 kg)  BMI 30.65 kg/m2 Constitutional: He is  oriented to person, place, and time. He appears well-developed and well-nourished. No distress.  HENT: No nasal discharge.  Head: Normocephalic and atraumatic.  Eyes: Pupils are equal and round. Right eye exhibits no discharge. Left eye exhibits no discharge.  Neck: Normal range of motion. Neck supple. No JVD present. No thyromegaly present.  Cardiovascular: Normal rate, regular rhythm, normal heart sounds and. Exam reveals no gallop and no friction rub. No murmur heard.  Pulmonary/Chest: Effort normal and breath sounds normal. No stridor. No respiratory distress. He has no wheezes. He has no rales. He exhibits no tenderness.  Abdominal: Soft. Bowel sounds are normal. He exhibits no distension. There is no tenderness. There is no rebound and no guarding.  Musculoskeletal: Normal range of motion. He exhibits no edema and no tenderness.  Neurological: He is alert and oriented to person, place, and time. Coordination normal.  Skin: Skin is warm and dry. No rash noted. He is not diaphoretic. No erythema. No pallor.  Psychiatric: He has a normal mood and affect. His behavior is normal. Judgment and thought content normal.       EKG: Sinus  Bradycardia  -  Nonspecific T-abnormality.   ABNORMAL      ASSESSMENT AND PLAN

## 2015-04-02 NOTE — Assessment & Plan Note (Signed)
Lab Results  Component Value Date   CHOL 131 02/05/2015   HDL 46.80 02/05/2015   LDLCALC 53 02/05/2015   LDLDIRECT 57.0 02/05/2015   TRIG 156.0* 02/05/2015   CHOLHDL 3 02/05/2015   Continue current dose of simvastatin. LDL was at target.

## 2015-04-02 NOTE — Assessment & Plan Note (Signed)
Blood pressure is well controlled on current medications. 

## 2015-04-02 NOTE — Assessment & Plan Note (Signed)
No previous history of CVA. Continue treatment of risk factors. Repeat study in one year.

## 2015-04-03 DIAGNOSIS — I10 Essential (primary) hypertension: Secondary | ICD-10-CM | POA: Diagnosis not present

## 2015-04-03 DIAGNOSIS — N183 Chronic kidney disease, stage 3 (moderate): Secondary | ICD-10-CM | POA: Diagnosis not present

## 2015-04-04 ENCOUNTER — Other Ambulatory Visit: Payer: Self-pay | Admitting: Cardiovascular Disease

## 2015-04-04 ENCOUNTER — Ambulatory Visit (INDEPENDENT_AMBULATORY_CARE_PROVIDER_SITE_OTHER): Payer: Medicare Other

## 2015-04-04 VITALS — BP 138/68 | HR 51

## 2015-04-04 DIAGNOSIS — I10 Essential (primary) hypertension: Secondary | ICD-10-CM | POA: Diagnosis not present

## 2015-04-04 NOTE — Progress Notes (Addendum)
Patient remains off Losartan.  Home readings are in a range of 130-150 SBP.  Vitals checked and documented in vitals section.  Please advise if any changes.    Resume losartan at 25 mg daily .  New rx will need to be sent to whichever he is using.   I have amended the above information and agree with above.   Deborra Medina, MD

## 2015-04-05 ENCOUNTER — Telehealth: Payer: Self-pay | Admitting: Internal Medicine

## 2015-04-05 MED ORDER — LOSARTAN POTASSIUM 25 MG PO TABS: 25.0000 mg | ORAL_TABLET | Freq: Every day | ORAL | Status: DC

## 2015-04-05 NOTE — Telephone Encounter (Signed)
Called patient verified pharmacy and faxed over decreased dosage of losartan to start due to elevated BP noted in clinical visit.

## 2015-04-05 NOTE — Addendum Note (Signed)
Addended by: [name] on: 04/05/2015 09:20 AM   Modules accepted: Orders

## 2015-04-05 NOTE — Progress Notes (Signed)
Juliann Pulse completed with pharmacy.

## 2015-04-18 ENCOUNTER — Other Ambulatory Visit: Payer: Self-pay | Admitting: Cardiovascular Disease

## 2015-04-18 ENCOUNTER — Other Ambulatory Visit: Payer: Self-pay | Admitting: Internal Medicine

## 2015-05-09 ENCOUNTER — Other Ambulatory Visit: Payer: Self-pay

## 2015-05-09 DIAGNOSIS — N400 Enlarged prostate without lower urinary tract symptoms: Secondary | ICD-10-CM

## 2015-05-09 MED ORDER — TAMSULOSIN HCL 0.4 MG PO CAPS: 0.4000 mg | ORAL_CAPSULE | Freq: Every day | ORAL | Status: DC

## 2015-08-06 ENCOUNTER — Ambulatory Visit (INDEPENDENT_AMBULATORY_CARE_PROVIDER_SITE_OTHER): Payer: Medicare Other | Admitting: Internal Medicine

## 2015-08-06 ENCOUNTER — Encounter: Payer: Self-pay | Admitting: Internal Medicine

## 2015-08-06 VITALS — BP 138/64 | HR 52 | Temp 97.7°F | Resp 12 | Ht 67.0 in | Wt 190.8 lb

## 2015-08-06 DIAGNOSIS — I10 Essential (primary) hypertension: Secondary | ICD-10-CM

## 2015-08-06 DIAGNOSIS — E785 Hyperlipidemia, unspecified: Secondary | ICD-10-CM | POA: Diagnosis not present

## 2015-08-06 DIAGNOSIS — R42 Dizziness and giddiness: Secondary | ICD-10-CM

## 2015-08-06 DIAGNOSIS — G252 Other specified forms of tremor: Secondary | ICD-10-CM

## 2015-08-06 DIAGNOSIS — E1121 Type 2 diabetes mellitus with diabetic nephropathy: Secondary | ICD-10-CM | POA: Diagnosis not present

## 2015-08-06 DIAGNOSIS — R259 Unspecified abnormal involuntary movements: Secondary | ICD-10-CM

## 2015-08-06 DIAGNOSIS — Z7289 Other problems related to lifestyle: Secondary | ICD-10-CM | POA: Diagnosis not present

## 2015-08-06 DIAGNOSIS — N179 Acute kidney failure, unspecified: Secondary | ICD-10-CM

## 2015-08-06 DIAGNOSIS — R258 Other abnormal involuntary movements: Secondary | ICD-10-CM

## 2015-08-06 DIAGNOSIS — E559 Vitamin D deficiency, unspecified: Secondary | ICD-10-CM

## 2015-08-06 DIAGNOSIS — Z0000 Encounter for general adult medical examination without abnormal findings: Secondary | ICD-10-CM

## 2015-08-06 DIAGNOSIS — H81393 Other peripheral vertigo, bilateral: Secondary | ICD-10-CM

## 2015-08-06 LAB — COMPREHENSIVE METABOLIC PANEL WITH GFR
ALT: 38 U/L (ref 0–53)
AST: 25 U/L (ref 0–37)
Albumin: 4.6 g/dL (ref 3.5–5.2)
Alkaline Phosphatase: 47 U/L (ref 39–117)
BUN: 15 mg/dL (ref 6–23)
CO2: 27 meq/L (ref 19–32)
Calcium: 10 mg/dL (ref 8.4–10.5)
Chloride: 103 meq/L (ref 96–112)
Creatinine, Ser: 1.51 mg/dL — ABNORMAL HIGH (ref 0.40–1.50)
GFR: 48.72 mL/min — ABNORMAL LOW (ref >60.00)
Glucose, Bld: 106 mg/dL — ABNORMAL HIGH (ref 70–99)
Potassium: 5 meq/L (ref 3.5–5.1)
Sodium: 138 meq/L (ref 135–145)
Total Bilirubin: 0.5 mg/dL (ref 0.2–1.2)
Total Protein: 7.1 g/dL (ref 6.0–8.3)

## 2015-08-06 LAB — VITAMIN B12: Vitamin B-12: 575 pg/mL (ref 211–911)

## 2015-08-06 LAB — MICROALBUMIN / CREATININE URINE RATIO
Creatinine,U: 148.8 mg/dL
MICROALB UR: 2.5 mg/dL — AB (ref 0.0–1.9)
Microalb Creat Ratio: 1.7 mg/g (ref 0.0–30.0)

## 2015-08-06 LAB — HEMOGLOBIN A1C: Hgb A1c MFr Bld: 6.1 % (ref 4.6–6.5)

## 2015-08-06 LAB — LDL CHOLESTEROL, DIRECT: Direct LDL: 38 mg/dL

## 2015-08-06 LAB — VITAMIN D 25 HYDROXY (VIT D DEFICIENCY, FRACTURES): VITD: 28.29 ng/mL — ABNORMAL LOW (ref 30.00–100.00)

## 2015-08-06 MED ORDER — DIAZEPAM 5 MG PO TABS: 5.0000 mg | ORAL_TABLET | Freq: Three times a day (TID) | ORAL | Status: DC | PRN

## 2015-08-06 NOTE — Progress Notes (Signed)
Pre-visit discussion using our clinic review tool. No additional management support is needed unless otherwise documented below in the visit note.  

## 2015-08-06 NOTE — Progress Notes (Signed)
Patient ID: Michai Gonyeau., male    DOB: 26-Mar-1946  Age: 70 y.o. MRN: [id number]  The patient is here for annual Medicare wellness examination and management of other chronic and acute problems.   Lab Results  Component Value Date   PSA 1.08 08/01/2014   PSA 1.00 07/07/2013   PSA 1.42 06/23/2012   Last eye exam dec 2016 colonscopy 2011 10 yrs   The risk factors are reflected in the social history.  The roster of all physicians providing medical care to patient - is listed in the Snapshot section of the chart.  Activities of daily living:  The patient is 100% independent in all ADLs: dressing, toileting, feeding as well as independent mobility  Home safety : The patient has smoke detectors in the home. They wear seatbelts.  There are no firearms at home. There is no violence in the home.   There is no risks for hepatitis, STDs or HIV. There is no   history of blood transfusion. They have no travel history to infectious disease endemic areas of the world.  The patient has seen their dentist in the last six month. They have seen their eye doctor in the last year. They admit to slight hearing difficulty with regard to whispered voices and some television programs.  They have deferred audiologic testing in the last year.  They do not  have excessive sun exposure. Discussed the need for sun protection: hats, long sleeves and use of sunscreen if there is significant sun exposure.   Diet: the importance of a healthy diet is discussed. They do have a healthy diet.  The benefits of regular aerobic exercise were discussed. She walks 4 times per week ,  20 minutes.   Depression screen: there are no signs or vegative symptoms of depression- irritability, change in appetite, anhedonia, sadness/tearfullness.  Cognitive assessment: the patient manages all their financial and personal affairs and is actively engaged. They could relate day,date,year and events; recalled 2/3 objects at 3 minutes;  performed clock-face test normally.  The following portions of the patient's history were reviewed and updated as appropriate: allergies, current medications, past family history, past medical history,  past surgical history, past social history  and problem list.  Visual acuity was not assessed per patient preference since she has regular follow up with her ophthalmologist. Hearing and body mass index were assessed and reviewed.   During the course of the visit the patient was educated and counseled about appropriate screening and preventive services including : fall prevention , diabetes screening, nutrition counseling, colorectal cancer screening, and recommended immunizations.    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 Diagnoses of Hyperlipidemia, Essential hypertension, Other problems related to lifestyle, Dizziness and giddiness, Vitamin D deficiency, Resting tremor, Vertigo, peripheral, bilateral, Acute renal failure, unspecified acute renal failure type ([place]), and Encounter for Medicare annual wellness exam were also pertinent to this visit. Having vertigo attacks every 4 to 6 weeks.  MRI/MRA Brain and CT temporal bones done.HCTZ stopped due to decreasein Gfr.   Having an intermittent resting tremor  of the right hand,  Now affecting the left hand as well.   Feels his balance is starting to fail, Worse in the morning when he first gets up.  Head doesn't feel right,  Feels full,  like a pressure,  Not actual pain.  No falls.    History Shamir has a past medical history of S/P CABG x 5 (11-99);  Diabetes mellitus without complication ([place]); History of cardiac catheterization (2011); 3-vessel coronary artery disease; Hypertension; Hypertriglyceridemia; Hyperlipidemia; and Vertigo.   He has past surgical history that includes Coronary artery bypass graft (03/1998) and Cardiac catheterization (05-19-2010).   His family history  includes Heart attack (age of onset: 58) in his father; Heart attack (age of onset: 6) in his mother; Heart disease in his brother and father; Hypertension in his mother.He reports that he has never smoked. He has never used smokeless tobacco. He reports that he does not drink alcohol or use illicit drugs.  Outpatient Prescriptions Prior to Visit  Medication Sig Dispense Refill  . aspirin 81 MG tablet Take by mouth. Takes 2 tablets daily.    . carvedilol (COREG) 6.25 MG tablet TAKE 1 TABLET BY MOUTH TWICE DAILY 180 tablet 3  . isosorbide mononitrate (IMDUR) 60 MG 24 hr tablet TAKE 1 TABLET BY MOUTH EVERY DAY. ALSO TAKE 1 30 MG TABLET EVERY DAY 90 tablet 1  . losartan (COZAAR) 25 MG tablet Take 1 tablet (25 mg total) by mouth daily. 90 tablet 1  . Multiple Vitamin (MULTIVITAMIN) tablet Take 1 tablet by mouth daily.      . nitroGLYCERIN (NITROSTAT) 0.4 MG SL tablet Place 1 tablet (0.4 mg total) under the tongue every 5 (five) minutes as needed. 30 tablet 3  . Omega-3 Fatty Acids (FISH OIL) 1200 MG CAPS Take by mouth daily.    Marland Kitchen omeprazole (PRILOSEC) 20 MG capsule TAKE 1 CAPSULE BY MOUTH EVERY OTHER MORNING AS DIRECTED    . polyethylene glycol (MIRALAX / GLYCOLAX) packet Take 17 g by mouth daily.    . ranolazine (RANEXA) 1000 MG SR tablet Take 1 tablet (1,000 mg total) by mouth 2 (two) times daily. 60 tablet 6  . simvastatin (ZOCOR) 20 MG tablet TAKE 1 TABLET BY MOUTH EVERY NIGHT AT BEDTIME 90 tablet 2  . tamsulosin (FLOMAX) 0.4 MG CAPS capsule Take 1 capsule (0.4 mg total) by mouth daily. 30 capsule 3  . zolpidem (AMBIEN) 10 MG tablet TAKE 1 TABLET BY MOUTH AT BEDTIME AS NEEDED FOR SLEEP 30 tablet 2  . diazepam (VALIUM) 5 MG tablet Take 1 tablet (5 mg total) by mouth every 8 (eight) hours as needed for anxiety. 30 tablet 0  . omeprazole (PRILOSEC) 20 MG capsule TAKE 1 CAPSULE BY MOUTH EVERY MORNING AS DIRECTED 90 capsule 0   No facility-administered medications prior to visit.    Review of  Systems   Patient denies headache, fevers, malaise, unintentional weight loss, skin rash, eye pain, sinus congestion and sinus pain, sore throat, dysphagia,  hemoptysis , cough, dyspnea, wheezing, chest pain, palpitations, orthopnea, edema, abdominal pain, nausea, melena, diarrhea, constipation, flank pain, dysuria, hematuria, urinary  Frequency, nocturia, numbness, tingling, seizures,  Focal weakness, Loss of consciousness, insomnia, depression, anxiety, and suicidal ideation.      Objective:  BP 138/64 mmHg  Pulse 52  Temp(Src) 97.7 F (36.5 C) (Oral)  Resp 12  Ht 5\' 7"  (1.702 m)  Wt 190 lb 12 oz (86.524 kg)  BMI 29.87 kg/m2  SpO2 97%  Physical Exam   General appearance: alert, cooperative and appears stated age Head: Normocephalic, without obvious abnormality, atraumatic Eyes: conjunctivae/corneas clear. PERRL, EOM's intact. Fundi benign. Ears: normal TM's and external ear canals both ears Nose: Nares normal. Septum midline. Mucosa normal. No drainage or sinus tenderness. Throat: lips, mucosa, and tongue normal; teeth and gums normal Neck: no adenopathy, no carotid bruit, no JVD, supple, symmetrical, trachea midline and  thyroid not enlarged, symmetric, no tenderness/mass/nodules Lungs: clear to auscultation bilaterally Breasts: normal appearance, no masses or tenderness Heart: regular rate and rhythm, S1, S2 normal, no murmur, click, rub or gallop Abdomen: soft, non-tender; bowel sounds normal; no masses,  no organomegaly Extremities: extremities normal, atraumatic, no cyanosis or edema Pulses: 2+ and symmetric Skin: Skin color, texture, turgor normal. No rashes or lesions Neurologic: Alert and oriented X 3, normal strength and tone. Normal symmetric reflexes. Normal coordination and gait. 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 Mild proteinuria,  On ARB , up to date on eye and foot exam done today.  Lab Results  Component Value Date   HGBA1C 6.1 08/06/2015   Lab Results  Component Value Date   MICROALBUR 2.5* 08/06/2015             Relevant Orders   Hemoglobin A1c (Completed)   Comprehensive metabolic panel (Completed)   Microalbumin / creatinine urine ratio (Completed)   Hypertension    Recent change to carvedilol by Dr Fletcher Anon and addition of flomax by urology,  Losartan was suspended  for decrease in GFR with no improvement. Will resume given proteinuria .   Lab Results  Component Value Date   CREATININE 1.51* 08/06/2015   CREATININE 1.53* 03/14/2015   CREATININE 1.58* 02/23/2015          Encounter for Medicare annual wellness exam    Annual comprehensive preventive exam was done as well as an evaluation and management of acute and chronic conditions .  During the course of the visit the patient was educated and counseled about appropriate screening and preventive services including :  colorectal cancer screening, and recommended immunizations.  Printed recommendations for health maintenance screenings was given.       Acute renal failure (HCC)    Bump in Cr noted with use of losartan and hctz did not improve after stopping both .  Will resume both.    Lab Results  Component Value Date   CREATININE 1.51* 08/06/2015         Vertigo, peripheral    Recurrent.  advised to resume hctz  trial given no change in GFR after stopping it. Brain imaging done to rule out mass.       Resting tremor    Now bilateral , accompanied by decrease in balance, and new onset vertigo . 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   Referral to Dr Manuella Ghazi discussed.         Relevant Orders   Ambulatory referral to Neurology   Hyperlipidemia   Relevant Orders   LDL cholesterol, direct (Completed)    Other Visit Diagnoses    Other problems related to lifestyle        Relevant Orders    Hepatitis C antibody  (Completed)    HIV antibody (Completed)    Dizziness and giddiness        Relevant Medications    diazepam (VALIUM) 5 MG tablet    Other Relevant Orders    Vitamin B12 (Completed)    Folate RBC (Completed)    Ambulatory referral to Neurology    Vitamin D deficiency        Relevant Orders    VITAMIN D 25 Hydroxy (Vit-D Deficiency, Fractures) (Completed)       I have changed Mr. Yamada's diazepam. I  am also having him maintain his multivitamin, aspirin, Fish Oil, omeprazole, nitroGLYCERIN, zolpidem, carvedilol, polyethylene glycol, ranolazine, simvastatin, losartan, isosorbide mononitrate, and tamsulosin.  Meds ordered this encounter  Medications  . diazepam (VALIUM) 5 MG tablet    Sig: Take 1 tablet (5 mg total) by mouth every 8 (eight) hours as needed for anxiety (or vertigo).    Dispense:  60 tablet    Refill:  1    Medications Discontinued During This Encounter  Medication Reason  . omeprazole (PRILOSEC) 20 MG capsule Duplicate  . diazepam (VALIUM) 5 MG tablet Reorder    Follow-up: No Follow-up on file.   [name], MD

## 2015-08-06 NOTE — Patient Instructions (Signed)
Referral to Dr Manuella Ghazi for your tremor i s underway  Health Maintenance, Male A healthy lifestyle and preventative care can promote health and wellness.  Maintain regular health, dental, and eye exams.  Eat a healthy diet. Foods like vegetables, fruits, whole grains, low-fat dairy products, and lean protein foods contain the nutrients you need and are low in calories. Decrease your intake of foods high in solid fats, added sugars, and salt. Get information about a proper diet from your health care provider, if necessary.  Regular physical exercise is one of the most important things you can do for your health. Most adults should get at least 150 minutes of moderate-intensity exercise (any activity that increases your heart rate and causes you to sweat) each week. In addition, most adults need muscle-strengthening exercises on 2 or more days a week.   Maintain a healthy weight. The body mass index (BMI) is a screening tool to identify possible weight problems. It provides an estimate of body fat based on height and weight. Your health care provider can find your BMI and can help you achieve or maintain a healthy weight. For males 20 years and older:  A BMI below 18.5 is considered underweight.  A BMI of 18.5 to 24.9 is normal.  A BMI of 25 to 29.9 is considered overweight.  A BMI of 30 and above is considered obese.  Maintain normal blood lipids and cholesterol by exercising and minimizing your intake of saturated fat. Eat a balanced diet with plenty of fruits and vegetables. Blood tests for lipids and cholesterol should begin at age 74 and be repeated every 5 years. If your lipid or cholesterol levels are high, you are over age 69, or you are at high risk for heart disease, you may need your cholesterol levels checked more frequently.Ongoing high lipid and cholesterol levels should be treated with medicines if diet and exercise are not working.  If you smoke, find out from your health care  provider how to quit. If you do not use tobacco, do not start.  Lung cancer screening is recommended for adults aged 14-80 years who are at high risk for developing lung cancer because of a history of smoking. A yearly low-dose CT scan of the lungs is recommended for people who have at least a 30-pack-year history of smoking and are current smokers or have quit within the past 15 years. A pack year of smoking is smoking an average of 1 pack of cigarettes a day for 1 year (for example, a 30-pack-year history of smoking could mean smoking 1 pack a day for 30 years or 2 packs a day for 15 years). Yearly screening should continue until the smoker has stopped smoking for at least 15 years. Yearly screening should be stopped for people who develop a health problem that would prevent them from having lung cancer treatment.  If you choose to drink alcohol, do not have more than 2 drinks per day. One drink is considered to be 12 oz (360 mL) of beer, 5 oz (150 mL) of wine, or 1.5 oz (45 mL) of liquor.  Avoid the use of street drugs. Do not share needles with anyone. Ask for help if you need support or instructions about stopping the use of drugs.  High blood pressure causes heart disease and increases the risk of stroke. High blood pressure is more likely to develop in:  People who have blood pressure in the end of the normal range (100-139/85-89 mm Hg).  People who  are overweight or obese.  People who are African American.  If you are 79-45 years of age, have your blood pressure checked every 3-5 years. If you are 99 years of age or older, have your blood pressure checked every year. You should have your blood pressure measured twice--once when you are at a hospital or clinic, and once when you are not at a hospital or clinic. Record the average of the two measurements. To check your blood pressure when you are not at a hospital or clinic, you can use:  An automated blood pressure machine at a  pharmacy.  A home blood pressure monitor.  If you are 72-33 years old, ask your health care provider if you should take aspirin to prevent heart disease.  Diabetes screening involves taking a blood sample to check your fasting blood sugar level. This should be done once every 3 years after age 44 if you are at a normal weight and without risk factors for diabetes. Testing should be considered at a younger age or be carried out more frequently if you are overweight and have at least 1 risk factor for diabetes.  Colorectal cancer can be detected and often prevented. Most routine colorectal cancer screening begins at the age of 11 and continues through age 91. However, your health care provider may recommend screening at an earlier age if you have risk factors for colon cancer. On a yearly basis, your health care provider may provide home test kits to check for hidden blood in the stool. A small camera at the end of a tube may be used to directly examine the colon (sigmoidoscopy or colonoscopy) to detect the earliest forms of colorectal cancer. Talk to your health care provider about this at age 51 when routine screening begins. A direct exam of the colon should be repeated every 5-10 years through age 48, unless early forms of precancerous polyps or small growths are found.  People who are at an increased risk for hepatitis B should be screened for this virus. You are considered at high risk for hepatitis B if:  You were born in a country where hepatitis B occurs often. Talk with your health care provider about which countries are considered high risk.  Your parents were born in a high-risk country and you have not received a shot to protect against hepatitis B (hepatitis B vaccine).  You have HIV or AIDS.  You use needles to inject street drugs.  You live with, or have sex with, someone who has hepatitis B.  You are a man who has sex with other men (MSM).  You get hemodialysis  treatment.  You take certain medicines for conditions like cancer, organ transplantation, and autoimmune conditions.  Hepatitis C blood testing is recommended for all people born from [birth year] through 1966 and any individual with known risk factors for hepatitis C.  Healthy men should no longer receive prostate-specific antigen (PSA) blood tests as part of routine cancer screening. Talk to your health care provider about prostate cancer screening.  Testicular cancer screening is not recommended for adolescents or adult males who have no symptoms. Screening includes self-exam, a health care provider exam, and other screening tests. Consult with your health care provider about any symptoms you have or any concerns you have about testicular cancer.  Practice safe sex. Use condoms and avoid high-risk sexual practices to reduce the spread of sexually transmitted infections (STIs).  You should be screened for STIs, including gonorrhea and chlamydia if:  You  are sexually active and are younger than 24 years.  You are older than 24 years, and your health care provider tells you that you are at risk for this type of infection.  Your sexual activity has changed since you were last screened, and you are at an increased risk for chlamydia or gonorrhea. Ask your health care provider if you are at risk.  If you are at risk of being infected with HIV, it is recommended that you take a prescription medicine daily to prevent HIV infection. This is called pre-exposure prophylaxis (PrEP). You are considered at risk if:  You are a man who has sex with other men (MSM).  You are a heterosexual man who is sexually active with multiple partners.  You take drugs by injection.  You are sexually active with a partner who has HIV.  Talk with your health care provider about whether you are at high risk of being infected with HIV. If you choose to begin PrEP, you should first be tested for HIV. You should then be tested  every 3 months for as long as you are taking PrEP.  Use sunscreen. Apply sunscreen liberally and repeatedly throughout the day. You should seek shade when your shadow is shorter than you. Protect yourself by wearing long sleeves, pants, a wide-brimmed hat, and sunglasses year round whenever you are outdoors.  Tell your health care provider of new moles or changes in moles, especially if there is a change in shape or color. Also, tell your health care provider if a mole is larger than the size of a pencil eraser.  A one-time screening for abdominal aortic aneurysm (AAA) and surgical repair of large AAAs by ultrasound is recommended for men aged 29-75 years who are current or former smokers.  Stay current with your vaccines (immunizations).   This information is not intended to replace advice given to you by your health care provider. Make sure you discuss any questions you have with your health care provider.   Document Released: 11/22/2007 Document Revised: 06/16/2014 Document Reviewed: 10/21/2010 Elsevier Interactive Patient Education [employer].

## 2015-08-07 ENCOUNTER — Encounter: Payer: Self-pay | Admitting: Internal Medicine

## 2015-08-07 DIAGNOSIS — R259 Unspecified abnormal involuntary movements: Secondary | ICD-10-CM

## 2015-08-07 DIAGNOSIS — G252 Other specified forms of tremor: Secondary | ICD-10-CM | POA: Insufficient documentation

## 2015-08-07 LAB — HEPATITIS C ANTIBODY: HCV Ab: NEGATIVE

## 2015-08-07 LAB — HIV ANTIBODY (ROUTINE TESTING W REFLEX): HIV 1&2 Ab, 4th Generation: NONREACTIVE

## 2015-08-07 LAB — FOLATE RBC: RBC Folate: 944 ng/mL (ref >280)

## 2015-08-07 NOTE — Assessment & Plan Note (Signed)
Recurrent.  advised to resume hctz  trial given no change in GFR after stopping it. Brain imaging done to rule out mass.

## 2015-08-07 NOTE — Assessment & Plan Note (Addendum)
Recent change to carvedilol by Dr Fletcher Anon and addition of flomax by urology,  Losartan was suspended  for decrease in GFR with no improvement. Will resume given proteinuria .   Lab Results  Component Value Date   CREATININE 1.51* 08/06/2015   CREATININE 1.53* 03/14/2015   CREATININE 1.58* 02/23/2015

## 2015-08-07 NOTE — Assessment & Plan Note (Signed)
Annual comprehensive preventive exam was done as well as an evaluation and management of acute and chronic conditions .  During the course of the visit the patient was educated and counseled about appropriate screening and preventive services including :  colorectal cancer screening, and recommended immunizations.  Printed recommendations for health maintenance screenings was given.

## 2015-08-07 NOTE — Assessment & Plan Note (Signed)
Bump in Cr noted with use of losartan and hctz did not improve after stopping both .  Will resume both.    Lab Results  Component Value Date   CREATININE 1.51* 08/06/2015

## 2015-08-07 NOTE — Assessment & Plan Note (Signed)
Now bilateral , accompanied by decrease in balance, and new onset vertigo . 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   Referral to Dr Manuella Ghazi discussed.

## 2015-08-07 NOTE — Assessment & Plan Note (Signed)
managd with diet alone . Mild proteinuria,  On ARB , up to date on eye and foot exam done today.  Lab Results  Component Value Date   HGBA1C 6.1 08/06/2015   Lab Results  Component Value Date   MICROALBUR 2.5* 08/06/2015

## 2015-09-05 ENCOUNTER — Other Ambulatory Visit: Payer: Self-pay

## 2015-09-05 DIAGNOSIS — N400 Enlarged prostate without lower urinary tract symptoms: Secondary | ICD-10-CM

## 2015-09-05 MED ORDER — TAMSULOSIN HCL 0.4 MG PO CAPS: 0.4000 mg | ORAL_CAPSULE | Freq: Every day | ORAL | Status: DC

## 2015-09-18 ENCOUNTER — Telehealth: Payer: Self-pay | Admitting: Cardiovascular Disease

## 2015-09-18 NOTE — Telephone Encounter (Signed)
Patient needs carotid per AVS 04-02-15  lmov to schedule no opening until 10/02/15

## 2015-09-20 ENCOUNTER — Ambulatory Visit: Payer: Medicare Other

## 2015-09-20 DIAGNOSIS — I6523 Occlusion and stenosis of bilateral carotid arteries: Secondary | ICD-10-CM | POA: Diagnosis not present

## 2015-09-21 ENCOUNTER — Encounter: Payer: Self-pay | Admitting: Cardiovascular Disease

## 2015-09-21 ENCOUNTER — Ambulatory Visit (INDEPENDENT_AMBULATORY_CARE_PROVIDER_SITE_OTHER): Payer: Medicare Other | Admitting: Cardiovascular Disease

## 2015-09-21 VITALS — BP 140/60 | HR 51 | Ht 66.0 in | Wt 192.5 lb

## 2015-09-21 DIAGNOSIS — I10 Essential (primary) hypertension: Secondary | ICD-10-CM

## 2015-09-21 DIAGNOSIS — I2510 Atherosclerotic heart disease of native coronary artery without angina pectoris: Secondary | ICD-10-CM | POA: Diagnosis not present

## 2015-09-21 DIAGNOSIS — I6523 Occlusion and stenosis of bilateral carotid arteries: Secondary | ICD-10-CM | POA: Diagnosis not present

## 2015-09-21 NOTE — Patient Instructions (Signed)
Medication Instructions: Continue same medications.   Labwork: None.   Procedures/Testing: None.   Follow-Up: 6 months with Dr. Salahuddin Arismendez.   Any Additional Special Instructions Will Be Listed Below (If Applicable).     If you need a refill on your cardiac medications before your next appointment, please call your pharmacy.   

## 2015-09-21 NOTE — Progress Notes (Signed)
Cardiology Office Note   Date:  09/21/2015   ID:  Johnathan Murano., DOB [birth year], MRN [id number]  PCP:  [name], MD  Cardiologist:   [name], MD   No chief complaint on file.     History of Present Illness: Johnathan Trivino. is a 70 y.o. male who presents for a followup visit.  He has known history of coronary artery disease status post CABG in 1999. He also has known history of hypertension, chronic kidney disease, hyperlipidemia and type 2 diabetes.He underwent a cardiac catheterization in 2011 which showed patent grafts. He continued to have mild exertional chest pain. A treadmill nuclear stress test in 06/2012 showed normal ejection fraction. Fixed inferior wall defect with normal wall motion and moderate reversibility in the septal wall.  He was started him on Ranexa with significant improvement in angina.  He reports stable anginal symptoms which happens only if he overexerts himself.   He had repeat carotid Doppler today which showed mild plaque formation with 1-39% stenosis bilaterally. He continues to attend rehabilitation. He does complain of increased fatigue and certain days. His vertigo has improved.  Past Medical History  Diagnosis Date  . 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 Diabetes mellitus without complication ([place])   . History of cardiac catheterization 2011    [facility]  . 3-vessel coronary artery disease     s/p  5 vessel CABG  . Hypertension   . Hypertriglyceridemia   . Hyperlipidemia   . Vertigo     Past Surgical History  Procedure Laterality Date  . Coronary artery bypass graft  03/1998    5 vessel, [facility]  . Cardiac catheterization  05-19-2010    [facility]: Patent grafts. LIMA to LAD, SVG to D1, OM1 and RPDA     Current Outpatient Prescriptions  Medication Sig Dispense Refill  . aspirin 81 MG tablet Take by mouth. Takes 2 tablets daily.    . carvedilol (COREG) 6.25 MG tablet TAKE 1 TABLET BY MOUTH TWICE DAILY 180 tablet 3  . diazepam  (VALIUM) 5 MG tablet Take 1 tablet (5 mg total) by mouth every 8 (eight) hours as needed for anxiety (or vertigo). 60 tablet 1  . isosorbide mononitrate (IMDUR) 60 MG 24 hr tablet TAKE 1 TABLET BY MOUTH EVERY DAY. ALSO TAKE 1 30 MG TABLET EVERY DAY 90 tablet 1  . losartan (COZAAR) 25 MG tablet Take 1 tablet (25 mg total) by mouth daily. 90 tablet 1  . Multiple Vitamin (MULTIVITAMIN) tablet Take 1 tablet by mouth daily.      . nitroGLYCERIN (NITROSTAT) 0.4 MG SL tablet Place 1 tablet (0.4 mg total) under the tongue every 5 (five) minutes as needed. 30 tablet 3  . Omega-3 Fatty Acids (FISH OIL) 1200 MG CAPS Take by mouth daily.    Marland Kitchen omeprazole (PRILOSEC) 20 MG capsule TAKE 1 CAPSULE BY MOUTH EVERY OTHER MORNING AS DIRECTED    . polyethylene glycol (MIRALAX / GLYCOLAX) packet Take 17 g by mouth daily.    . ranolazine (RANEXA) 1000 MG SR tablet Take 1 tablet (1,000 mg total) by mouth 2 (two) times daily. 60 tablet 6  . simvastatin (ZOCOR) 20 MG tablet TAKE 1 TABLET BY MOUTH EVERY NIGHT AT BEDTIME 90 tablet 2  . tamsulosin (FLOMAX) 0.4 MG CAPS capsule Take 1 capsule (0.4 mg total) by mouth daily. 30 capsule 3  . zolpidem (AMBIEN) 10 MG tablet TAKE 1 TABLET BY MOUTH AT BEDTIME AS NEEDED  FOR SLEEP 30 tablet 2   No current facility-administered medications for this visit.    Allergies:   Review of patient's allergies indicates no known allergies.    Social History:  The patient  reports that he has never smoked. He has never used smokeless tobacco. He reports that he does not drink alcohol or use illicit drugs.   Family History:  The patient's family history includes Heart attack (age of onset: 31) in his father; Heart attack (age of onset: 46) in his mother; Heart disease in his brother and father; Hypertension in his mother.    ROS:  Please see the history of present illness.   Otherwise, review of systems are positive for none.   All other systems are reviewed and negative.    PHYSICAL  EXAM: VS:  There were no vitals taken for this visit. , BMI There is no weight on file to calculate BMI. GEN: Well nourished, well developed, in no acute distress HEENT: normal Neck: no JVD, carotid bruits, or masses Cardiac: RRR; no murmurs, rubs, or gallops,no edema  Respiratory:  clear to auscultation bilaterally, normal work of breathing GI: soft, nontender, nondistended, + BS MS: no deformity or atrophy Skin: warm and dry, no rash Neuro:  Strength and sensation are intact Psych: euthymic mood, full affect   EKG:  EKG is ordered today. The ekg ordered today demonstrates sinus bradycardia with nonspecific ST changes   Recent Labs: 10/11/2014: Hemoglobin 13.0; Platelets 200 08/06/2015: ALT 38; BUN 15; Creatinine, Ser 1.51*; Potassium 5.0; Sodium 138    Lipid Panel    Component Value Date/Time   CHOL 131 02/05/2015 1403   TRIG 156.0* 02/05/2015 1403   HDL 46.80 02/05/2015 1403   CHOLHDL 3 02/05/2015 1403   VLDL 31.2 02/05/2015 1403   LDLCALC 53 02/05/2015 1403   LDLDIRECT 38.0 08/06/2015 1118      Wt Readings from Last 3 Encounters:  08/06/15 190 lb 12 oz (86.524 kg)  04/02/15 195 lb 12 oz (88.792 kg)  02/28/15 196 lb 2 oz (88.962 kg)        ASSESSMENT AND PLAN:  1.  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 Continue treatment with Ranexa, and/or and carvedilol.  2. Bilateral carotid artery disease: Repeat Doppler yesterday showed only mild stenosis bilaterally. Continue treatment of risk factors.  3. Hyperlipidemia: Continue treatment with simvastatin. Most recent LDL was 38.  4. Essential hypertension: Blood pressure is reasonably controlled on current medications     Disposition:   FU with me in 6 months  Signed,  [name], MD  09/21/2015 7:58 AM    Johnathan Arnold

## 2015-09-26 ENCOUNTER — Other Ambulatory Visit: Payer: Self-pay | Admitting: Internal Medicine

## 2015-10-05 ENCOUNTER — Other Ambulatory Visit: Payer: Self-pay | Admitting: Internal Medicine

## 2015-10-05 ENCOUNTER — Other Ambulatory Visit: Payer: Self-pay

## 2015-10-05 DIAGNOSIS — N480 Leukoplakia of penis: Secondary | ICD-10-CM

## 2015-10-05 MED ORDER — CLOBETASOL PROPIONATE 0.05 % EX CREA: 1.0000 "application " | TOPICAL_CREAM | Freq: Two times a day (BID) | CUTANEOUS | Status: DC

## 2015-10-05 NOTE — Progress Notes (Signed)
Pt walked in stating if he was to continue clobetasol cream then he will need a refill. Per last dictation pt does need to continue medication. Therefore refills were sent to pt pharmacy.

## 2015-10-28 ENCOUNTER — Other Ambulatory Visit: Payer: Self-pay | Admitting: Cardiovascular Disease

## 2015-10-29 DIAGNOSIS — M9903 Segmental and somatic dysfunction of lumbar region: Secondary | ICD-10-CM | POA: Diagnosis not present

## 2015-10-29 DIAGNOSIS — M5431 Sciatica, right side: Secondary | ICD-10-CM | POA: Diagnosis not present

## 2015-10-29 DIAGNOSIS — M5136 Other intervertebral disc degeneration, lumbar region: Secondary | ICD-10-CM | POA: Diagnosis not present

## 2015-10-29 DIAGNOSIS — M9902 Segmental and somatic dysfunction of thoracic region: Secondary | ICD-10-CM | POA: Diagnosis not present

## 2015-11-12 ENCOUNTER — Other Ambulatory Visit: Payer: Self-pay | Admitting: Cardiovascular Disease

## 2015-11-26 DIAGNOSIS — M5431 Sciatica, right side: Secondary | ICD-10-CM | POA: Diagnosis not present

## 2015-11-26 DIAGNOSIS — M9903 Segmental and somatic dysfunction of lumbar region: Secondary | ICD-10-CM | POA: Diagnosis not present

## 2015-11-26 DIAGNOSIS — M9902 Segmental and somatic dysfunction of thoracic region: Secondary | ICD-10-CM | POA: Diagnosis not present

## 2015-11-26 DIAGNOSIS — M5136 Other intervertebral disc degeneration, lumbar region: Secondary | ICD-10-CM | POA: Diagnosis not present

## 2015-12-03 DIAGNOSIS — R42 Dizziness and giddiness: Secondary | ICD-10-CM | POA: Diagnosis not present

## 2015-12-03 DIAGNOSIS — G20 Parkinson's disease: Secondary | ICD-10-CM | POA: Diagnosis not present

## 2015-12-03 DIAGNOSIS — G4752 REM sleep behavior disorder: Secondary | ICD-10-CM | POA: Diagnosis not present

## 2015-12-21 DIAGNOSIS — M9902 Segmental and somatic dysfunction of thoracic region: Secondary | ICD-10-CM | POA: Diagnosis not present

## 2015-12-21 DIAGNOSIS — M5136 Other intervertebral disc degeneration, lumbar region: Secondary | ICD-10-CM | POA: Diagnosis not present

## 2015-12-21 DIAGNOSIS — M5431 Sciatica, right side: Secondary | ICD-10-CM | POA: Diagnosis not present

## 2015-12-21 DIAGNOSIS — M9903 Segmental and somatic dysfunction of lumbar region: Secondary | ICD-10-CM | POA: Diagnosis not present

## 2016-01-04 ENCOUNTER — Other Ambulatory Visit: Payer: Self-pay | Admitting: Internal Medicine

## 2016-01-09 ENCOUNTER — Other Ambulatory Visit: Payer: Self-pay

## 2016-01-09 ENCOUNTER — Telehealth: Payer: Self-pay | Admitting: Urology

## 2016-01-09 DIAGNOSIS — N400 Enlarged prostate without lower urinary tract symptoms: Secondary | ICD-10-CM

## 2016-01-09 MED ORDER — TAMSULOSIN HCL 0.4 MG PO CAPS: 0.4000 mg | ORAL_CAPSULE | Freq: Every day | ORAL | 3 refills | Status: DC

## 2016-01-09 NOTE — Telephone Encounter (Signed)
I attempted to contact the pt to verify his pharmacy so I could refill his Flomax. In the message it states [facility], but we got [facility] in the system.

## 2016-01-09 NOTE — Telephone Encounter (Signed)
Pharmacy was verified as [facility], Rx was sent

## 2016-01-11 ENCOUNTER — Other Ambulatory Visit: Payer: Self-pay | Admitting: Cardiovascular Disease

## 2016-01-25 DIAGNOSIS — M5431 Sciatica, right side: Secondary | ICD-10-CM | POA: Diagnosis not present

## 2016-01-25 DIAGNOSIS — M5136 Other intervertebral disc degeneration, lumbar region: Secondary | ICD-10-CM | POA: Diagnosis not present

## 2016-01-25 DIAGNOSIS — M9902 Segmental and somatic dysfunction of thoracic region: Secondary | ICD-10-CM | POA: Diagnosis not present

## 2016-01-25 DIAGNOSIS — M9903 Segmental and somatic dysfunction of lumbar region: Secondary | ICD-10-CM | POA: Diagnosis not present

## 2016-02-19 ENCOUNTER — Other Ambulatory Visit: Payer: Self-pay | Admitting: Cardiovascular Disease

## 2016-02-22 DIAGNOSIS — M5136 Other intervertebral disc degeneration, lumbar region: Secondary | ICD-10-CM | POA: Diagnosis not present

## 2016-02-22 DIAGNOSIS — M9903 Segmental and somatic dysfunction of lumbar region: Secondary | ICD-10-CM | POA: Diagnosis not present

## 2016-02-22 DIAGNOSIS — M9902 Segmental and somatic dysfunction of thoracic region: Secondary | ICD-10-CM | POA: Diagnosis not present

## 2016-02-22 DIAGNOSIS — M5431 Sciatica, right side: Secondary | ICD-10-CM | POA: Diagnosis not present

## 2016-02-27 ENCOUNTER — Ambulatory Visit: Payer: Medicare Other

## 2016-02-27 ENCOUNTER — Telehealth: Payer: Self-pay | Admitting: Internal Medicine

## 2016-02-27 DIAGNOSIS — E785 Hyperlipidemia, unspecified: Secondary | ICD-10-CM

## 2016-02-27 DIAGNOSIS — E1121 Type 2 diabetes mellitus with diabetic nephropathy: Secondary | ICD-10-CM

## 2016-02-27 NOTE — Telephone Encounter (Signed)
Ok. I called pt and left vm to call the office.

## 2016-02-27 NOTE — Telephone Encounter (Signed)
Please schedule patient fasting lab appointment.

## 2016-02-27 NOTE — Telephone Encounter (Signed)
Pt sent a Mychart message stating he received a request to get A1C checked. Need orders please and thank you!

## 2016-02-27 NOTE — Telephone Encounter (Signed)
Fasting labs are ordered.

## 2016-02-27 NOTE — Telephone Encounter (Signed)
Patient received message from health maintenance. Please advise.

## 2016-02-28 ENCOUNTER — Encounter: Payer: Self-pay | Admitting: Urology

## 2016-02-28 ENCOUNTER — Ambulatory Visit (INDEPENDENT_AMBULATORY_CARE_PROVIDER_SITE_OTHER): Payer: Medicare Other | Admitting: Urology

## 2016-02-28 VITALS — BP 151/69 | HR 60 | Ht 67.0 in | Wt 195.4 lb

## 2016-02-28 DIAGNOSIS — N480 Leukoplakia of penis: Secondary | ICD-10-CM | POA: Diagnosis not present

## 2016-02-28 DIAGNOSIS — I6523 Occlusion and stenosis of bilateral carotid arteries: Secondary | ICD-10-CM | POA: Diagnosis not present

## 2016-02-28 DIAGNOSIS — N400 Enlarged prostate without lower urinary tract symptoms: Secondary | ICD-10-CM

## 2016-02-28 DIAGNOSIS — Z125 Encounter for screening for malignant neoplasm of prostate: Secondary | ICD-10-CM

## 2016-02-28 LAB — BLADDER SCAN AMB NON-IMAGING: SCAN RESULT: 49

## 2016-02-28 NOTE — Progress Notes (Signed)
02/28/2016 8:41 AM   Johnathan Arnold [birth year] [id number]  Referring provider: [name], MD [place], [place] [address]  Chief Complaint  Patient presents with  . 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   1. BXO The patient underwent a penile biopsy, cystoscopy, meatal dilation on 03/20/2014 for penile lesions and spraying with urination. Intraoperatively he was noted to have meatal stenosis consistent with BXO and required dilation. Pathology showed evidence of lichen sclerosis. He reports a good stream now and is using clobestol. He continues to use his medication once per week. He denies any dysuria or hematuria.   2. BPH He reports nocturia 1. . He is on Flomax.   His I PSS score today is 2. His frequency along with after mentions nocturia 1. He denies any other urinary symptoms.  3. Prostate cancer screening Due for DRE/PSA today  PMH: Past Medical History:  Diagnosis Date  . 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 Diabetes mellitus without complication ([place])   . History of cardiac catheterization 2011   [facility]  . Hyperlipidemia   . Hypertension   . Hypertriglyceridemia   . 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 Vertigo     Surgical History: Past Surgical History:  Procedure Laterality Date  . CARDIAC CATHETERIZATION  05-19-2010   [facility]: Patent grafts. LIMA to LAD, SVG to D1, OM1 and RPDA  . CORONARY ARTERY BYPASS GRAFT  03/1998   5 vessel, [facility]    Home Medications:    Medication List       Accurate as of 02/28/16  8:41 AM. Always use your most recent med list.          aspirin 81 MG tablet Take by mouth. Takes 2 tablets daily.   carbidopa-levodopa 25-100 MG tablet Commonly known as:  SINEMET IR TAKE 1/2 TABLET BY MOUTH THREE TIMES DAILY FOR 1 WEEK, THEN 1 TABLET THREE TIMES DAILY   carvedilol 6.25 MG tablet Commonly known as:   COREG TAKE 1 TABLET BY MOUTH TWICE DAILY   clobetasol cream 0.05 % Commonly known as:  TEMOVATE Apply 1 application topically 2 (two) times daily. Apply medication once a week   diazepam 5 MG tablet Commonly known as:  VALIUM Take by mouth.   Fish Oil 1200 MG Caps Take by mouth daily.   FISH OIL PO Take by mouth.   hydrochlorothiazide 25 MG tablet Commonly known as:  HYDRODIURIL Take 25 mg by mouth daily.   isosorbide mononitrate 60 MG 24 hr tablet Commonly known as:  IMDUR TAKE 1 TABLET BY MOUTH EVERY DAY. ALSO TAKE 1 30 MG TABLET EVERY DAY   losartan 25 MG tablet Commonly known as:  COZAAR TAKE 1 TABLET BY MOUTH EVERY DAY   multivitamin tablet Take 1 tablet by mouth daily.   nitroGLYCERIN 0.4 MG SL tablet Commonly known as:  NITROSTAT Place under the tongue.   omeprazole 20 MG capsule Commonly known as:  PRILOSEC TAKE 1 CAPSULE BY MOUTH EVERY MORNING AS DIRECTED   polyethylene glycol packet Commonly known as:  MIRALAX / GLYCOLAX Take by mouth.   RANEXA 1000 MG SR tablet Generic drug:  ranolazine TAKE 1 TABLET(1000 MG) BY MOUTH TWICE DAILY   simvastatin 20 MG tablet Commonly known as:  ZOCOR TAKE 1 TABLET BY MOUTH EVERY NIGHT AT BEDTIME  tamsulosin 0.4 MG Caps capsule Commonly known as:  FLOMAX Take 1 capsule (0.4 mg total) by mouth daily.   zolpidem 10 MG tablet Commonly known as:  AMBIEN TAKE 1 TABLET BY MOUTH EVERY NIGHT AT BEDTIME AS NEEDED FOR SLEEP       Allergies: No Known Allergies  Family History: Family History  Problem Relation Age of Onset  . Heart attack Mother 27  . Hypertension Mother   . Heart attack Father 46  . Heart disease Father   . Heart disease Brother     Social History:  reports that he has never smoked. He has never used smokeless tobacco. He reports that he does not drink alcohol or use drugs.  ROS: UROLOGY Frequent Urination?: No Hard to postpone urination?: No Burning/pain with urination?: No Get up at  night to urinate?: No Leakage of urine?: No Urine stream starts and stops?: No Trouble starting stream?: No Do you have to strain to urinate?: No Blood in urine?: No Urinary tract infection?: No Sexually transmitted disease?: No Injury to kidneys or bladder?: No Painful intercourse?: No Weak stream?: No Erection problems?: No Penile pain?: No  Gastrointestinal Nausea?: No Vomiting?: No Indigestion/heartburn?: No Diarrhea?: No Constipation?: Yes  Constitutional Fever: No Night sweats?: No Weight loss?: No Fatigue?: No  Skin Skin rash/lesions?: No Itching?: No  Eyes Blurred vision?: No Double vision?: No  Ears/Nose/Throat Sore throat?: No Sinus problems?: No  Hematologic/Lymphatic Swollen glands?: No Easy bruising?: No  Cardiovascular Leg swelling?: No Chest pain?: No  Respiratory Cough?: No Shortness of breath?: No  Endocrine Excessive thirst?: No  Musculoskeletal Back pain?: No Joint pain?: No  Neurological Headaches?: No Dizziness?: No  Psychologic Depression?: No Anxiety?: No  Physical Exam: BP (!) 151/69   Pulse 60   Ht 5\' 7"  (1.702 m)   Wt 195 lb 6.4 oz (88.6 kg)   BMI 30.60 kg/m   Constitutional:  Alert and oriented, No acute distress. HEENT: [place] AT, moist mucus membranes.  Trachea midline, no masses. Cardiovascular: No clubbing, cyanosis, or edema. Respiratory: Normal respiratory effort, no increased work of breathing. GI: Abdomen is soft, nontender, nondistended, no abdominal masses GU: No CVA tenderness. Normal phallus. Meatus patent. DRE: 2+ smooth benign. Skin: No rashes, bruises or suspicious lesions. Lymph: No cervical or inguinal adenopathy. Neurologic: Grossly intact, no focal deficits, moving all 4 extremities. Psychiatric: Normal mood and affect.  Laboratory Data: Lab Results  Component Value Date   WBC 8.7 10/11/2014   HGB 13.0 10/11/2014   HCT 38.0 (L) 10/11/2014   MCV 93.7 10/11/2014   PLT 200 10/11/2014     Lab Results  Component Value Date   CREATININE 1.51 (H) 08/06/2015    Lab Results  Component Value Date   PSA 1.08 08/01/2014   PSA 1.00 07/07/2013   PSA 1.42 06/23/2012    Lab Results  Component Value Date   TESTOSTERONE 281 (L) 09/21/2012    Lab Results  Component Value Date   HGBA1C 6.1 08/06/2015    Urinalysis    Component Value Date/Time   COLORURINE YELLOW (A) 10/11/2014 2330   APPEARANCEUR Clear 02/27/2015 1020   LABSPEC 1.012 10/11/2014 2330   LABSPEC 1.009 03/15/2014 1005   PHURINE 5.0 10/11/2014 2330   GLUCOSEU Negative 02/27/2015 1020   GLUCOSEU Negative 03/15/2014 1005   HGBUR NEGATIVE 10/11/2014 2330   BILIRUBINUR Negative 02/27/2015 1020   BILIRUBINUR Negative 03/15/2014 1005   KETONESUR TRACE (A) 10/11/2014 2330   PROTEINUR Negative 02/27/2015 1020   PROTEINUR  NEGATIVE 10/11/2014 2330   UROBILINOGEN 1.0 03/16/2012 1630   NITRITE Negative 02/27/2015 1020   NITRITE NEGATIVE 10/11/2014 2330   LEUKOCYTESUR Negative 02/27/2015 1020   LEUKOCYTESUR Negative 03/15/2014 1005     Assessment & Plan:    1. Benign prostatic hypertrophy with urinary frequency -Continue Flomax 0.4 mg daily at bedtime  2. BXO (balanitis xerotica obliterans) -I discussed with the patient again that the clobestol will slow the disease progression. He will continue this medication on a weekly basis.   3.  Prostate cancer screening -Due for PSA today   Return in about 1 year (around 02/27/2017).  Nickie Retort, MD  [facility] [address], [place], [place] [address] [phone]

## 2016-02-29 LAB — PSA: Prostate Specific Ag, Serum: 1.2 ng/mL (ref 0.0–4.0)

## 2016-03-07 ENCOUNTER — Telehealth: Payer: Self-pay

## 2016-03-07 NOTE — Telephone Encounter (Signed)
Please let patient know PSA is normal. He should follow up as scheduled. thanks - Dr. Bernita Buffy with pt in reference to PSA results. Pt voiced understanding.

## 2016-03-10 ENCOUNTER — Encounter: Payer: Self-pay | Admitting: Cardiovascular Disease

## 2016-03-10 ENCOUNTER — Ambulatory Visit (INDEPENDENT_AMBULATORY_CARE_PROVIDER_SITE_OTHER): Payer: Medicare Other | Admitting: Cardiovascular Disease

## 2016-03-10 VITALS — BP 120/54 | HR 51 | Ht 67.0 in | Wt 195.8 lb

## 2016-03-10 DIAGNOSIS — I2510 Atherosclerotic heart disease of native coronary artery without angina pectoris: Secondary | ICD-10-CM

## 2016-03-10 DIAGNOSIS — E1159 Type 2 diabetes mellitus with other circulatory complications: Secondary | ICD-10-CM

## 2016-03-10 DIAGNOSIS — E7800 Pure hypercholesterolemia, unspecified: Secondary | ICD-10-CM

## 2016-03-10 DIAGNOSIS — Z23 Encounter for immunization: Secondary | ICD-10-CM

## 2016-03-10 DIAGNOSIS — I209 Angina pectoris, unspecified: Secondary | ICD-10-CM

## 2016-03-10 DIAGNOSIS — I10 Essential (primary) hypertension: Secondary | ICD-10-CM

## 2016-03-10 DIAGNOSIS — I6523 Occlusion and stenosis of bilateral carotid arteries: Secondary | ICD-10-CM

## 2016-03-10 MED ORDER — RANOLAZINE ER 1000 MG PO TB12: ORAL_TABLET | ORAL | 11 refills | Status: DC

## 2016-03-10 NOTE — Progress Notes (Signed)
Cardiology Office Note   Date:  03/10/2016   ID:  Johnathan Mcquillin., DOB 1945/12/19, MRN [id number]  PCP:  [name], MD  Cardiologist:   [name], MD   Chief Complaint  Patient presents with  . other    6 month follow up. Meds reviewed by the pt. verbally. "doing well."       History of Present Illness: Johnathan Fuhrmeister. is a 70 y.o. male who presents for a followup visit.  He has known history of coronary artery disease status post CABG in 1999. He also has known history of hypertension, chronic kidney disease, hyperlipidemia and type 2 diabetes.He underwent a cardiac catheterization in 2011 which showed patent grafts. He continued to have mild exertional chest pain. A treadmill nuclear stress test in 06/2012 showed normal ejection fraction. Fixed inferior wall defect with normal wall motion and moderate reversibility in the septal wall.  Previous carotid Doppler showed mild nonobstructive bilateral disease. He has been doing extremely well and denies any exertional chest pain or shortness of breath. He continues to exercise on a regular basis. 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   Past Medical History:  Diagnosis Date  . 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 Diabetes mellitus without complication ([place])   . History of cardiac catheterization 2011   [facility]  . Hyperlipidemia   . Hypertension   . Hypertriglyceridemia   . 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 Vertigo     Past Surgical History:  Procedure Laterality Date  . CARDIAC CATHETERIZATION  05-19-2010   [facility]: Patent grafts. LIMA to LAD, SVG to D1, OM1 and RPDA  . CORONARY ARTERY BYPASS GRAFT  03/1998   5 vessel, [facility]     Current Outpatient Prescriptions  Medication Sig Dispense Refill  . aspirin 81 MG tablet Take by mouth. Takes 2 tablets daily.    . carbidopa-levodopa (SINEMET IR) 25-100 MG tablet TAKE 1/2 TABLET BY MOUTH THREE TIMES DAILY FOR 1  WEEK, THEN 1 TABLET THREE TIMES DAILY    . carvedilol (COREG) 6.25 MG tablet TAKE 1 TABLET BY MOUTH TWICE DAILY 180 tablet 3  . clobetasol cream (TEMOVATE) AB-123456789 % Apply 1 application topically 2 (two) times daily. Apply medication once a week 30 g 2  . diazepam (VALIUM) 5 MG tablet Take by mouth.    . hydrochlorothiazide (HYDRODIURIL) 25 MG tablet Take 25 mg by mouth daily.    . isosorbide mononitrate (IMDUR) 60 MG 24 hr tablet TAKE 1 TABLET BY MOUTH EVERY DAY. ALSO TAKE 1 30 MG TABLET EVERY DAY 90 tablet 3  . losartan (COZAAR) 25 MG tablet TAKE 1 TABLET BY MOUTH EVERY DAY 90 tablet 0  . Multiple Vitamin (MULTIVITAMIN) tablet Take 1 tablet by mouth daily.      . nitroGLYCERIN (NITROSTAT) 0.4 MG SL tablet Place under the tongue.    . Omega-3 Fatty Acids (FISH OIL PO) Take by mouth.    . Omega-3 Fatty Acids (FISH OIL) 1200 MG CAPS Take by mouth daily.    Marland Kitchen omeprazole (PRILOSEC) 20 MG capsule TAKE 1 CAPSULE BY MOUTH EVERY MORNING AS DIRECTED 90 capsule 0  . polyethylene glycol (MIRALAX / GLYCOLAX) packet Take by mouth.    Marland Kitchen RANEXA 1000 MG SR tablet TAKE 1 TABLET(1000 MG) BY MOUTH TWICE DAILY 60 tablet 3  . simvastatin (ZOCOR) 20 MG tablet TAKE 1 TABLET BY MOUTH  EVERY NIGHT AT BEDTIME 90 tablet 3  . tamsulosin (FLOMAX) 0.4 MG CAPS capsule Take 1 capsule (0.4 mg total) by mouth daily. 30 capsule 3  . zolpidem (AMBIEN) 10 MG tablet TAKE 1 TABLET BY MOUTH EVERY NIGHT AT BEDTIME AS NEEDED FOR SLEEP 30 tablet 0   No current facility-administered medications for this visit.     Allergies:   Review of patient's allergies indicates no known allergies.    Social History:  The patient  reports that he has never smoked. He has never used smokeless tobacco. He reports that he does not drink alcohol or use drugs.   Family History:  The patient's family history includes Heart attack (age of onset: 70) in his father; Heart attack (age of onset: 15) in his mother; Heart disease in his brother and father;  Hypertension in his mother.    ROS:  Please see the history of present illness.   Otherwise, review of systems are positive for none.   All other systems are reviewed and negative.    PHYSICAL EXAM: VS:  BP (!) 120/54   Ht 5\' 7"  (1.702 m)   Wt 195 lb 12 oz (88.8 kg)   BMI 30.66 kg/m  , BMI Body mass index is 30.66 kg/m. GEN: Well nourished, well developed, in no acute distress  HEENT: normal  Neck: no JVD, carotid bruits, or masses Cardiac: RRR; no murmurs, rubs, or gallops,no edema  Respiratory:  clear to auscultation bilaterally, normal work of breathing GI: soft, nontender, nondistended, + BS MS: no deformity or atrophy  Skin: warm and dry, no rash Neuro:  Strength and sensation are intact Psych: euthymic mood, full affect   EKG:  EKG is ordered today. The ekg ordered today demonstrates sinus bradycardia with nonspecific ST changes   Recent Labs: 08/06/2015: ALT 38; BUN 15; Creatinine, Ser 1.51; Potassium 5.0; Sodium 138    Lipid Panel    Component Value Date/Time   CHOL 131 02/05/2015 1403   TRIG 156.0 (H) 02/05/2015 1403   HDL 46.80 02/05/2015 1403   CHOLHDL 3 02/05/2015 1403   VLDL 31.2 02/05/2015 1403   LDLCALC 53 02/05/2015 1403   LDLDIRECT 38.0 08/06/2015 1118      Wt Readings from Last 3 Encounters:  03/10/16 195 lb 12 oz (88.8 kg)  02/28/16 195 lb 6.4 oz (88.6 kg)  09/21/15 192 lb 8 oz (87.3 kg)        ASSESSMENT AND PLAN:  1.  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  Continue antianginal medications including Ranexa.  2. Bilateral carotid artery disease:  Mild nonobstructive disease. Continue treatment of risk factors.  3. Hyperlipidemia: Continue treatment with simvastatin. Most recent LDL was 38.  4. Essential hypertension: Blood pressure is well controlled on current medications  5. The patient was given the flu shot today.  Disposition:   FU  with me in 12 months  Signed,  [name], MD  03/10/2016 10:19 AM    [place]

## 2016-03-10 NOTE — Patient Instructions (Signed)
Medication Instructions: Continue same medications.   Labwork: None.   Procedures/Testing: None.   Follow-Up: 1 year with Dr. Jasai Sorg.   Any Additional Special Instructions Will Be Listed Below (If Applicable).     If you need a refill on your cardiac medications before your next appointment, please call your pharmacy.   

## 2016-03-12 ENCOUNTER — Other Ambulatory Visit (INDEPENDENT_AMBULATORY_CARE_PROVIDER_SITE_OTHER): Payer: Medicare Other

## 2016-03-12 DIAGNOSIS — E785 Hyperlipidemia, unspecified: Secondary | ICD-10-CM | POA: Diagnosis not present

## 2016-03-12 DIAGNOSIS — E1121 Type 2 diabetes mellitus with diabetic nephropathy: Secondary | ICD-10-CM

## 2016-03-12 LAB — LIPID PANEL
CHOLESTEROL: 122 mg/dL (ref 0–200)
HDL: 53.2 mg/dL (ref >39.00)
LDL Cholesterol: 49 mg/dL (ref 0–99)
NonHDL: 69.19
TRIGLYCERIDES: 99 mg/dL (ref 0.0–149.0)
Total CHOL/HDL Ratio: 2
VLDL: 19.8 mg/dL (ref 0.0–40.0)

## 2016-03-12 LAB — COMPREHENSIVE METABOLIC PANEL
ALBUMIN: 4.2 g/dL (ref 3.5–5.2)
ALK PHOS: 44 U/L (ref 39–117)
ALT: 7 U/L (ref 0–53)
AST: 18 U/L (ref 0–37)
BUN: 19 mg/dL (ref 6–23)
CHLORIDE: 101 meq/L (ref 96–112)
CO2: 30 mEq/L (ref 19–32)
Calcium: 9.4 mg/dL (ref 8.4–10.5)
Creatinine, Ser: 1.68 mg/dL — ABNORMAL HIGH (ref 0.40–1.50)
GFR: 43 mL/min — AB (ref >60.00)
Glucose, Bld: 114 mg/dL — ABNORMAL HIGH (ref 70–99)
POTASSIUM: 4.9 meq/L (ref 3.5–5.1)
SODIUM: 138 meq/L (ref 135–145)
TOTAL PROTEIN: 6.9 g/dL (ref 6.0–8.3)
Total Bilirubin: 0.7 mg/dL (ref 0.2–1.2)

## 2016-03-12 LAB — HEMOGLOBIN A1C: HEMOGLOBIN A1C: 5.8 % (ref 4.6–6.5)

## 2016-03-12 LAB — MICROALBUMIN / CREATININE URINE RATIO
Creatinine,U: 170 mg/dL
MICROALB UR: 2.2 mg/dL — AB (ref 0.0–1.9)
MICROALB/CREAT RATIO: 1.3 mg/g (ref 0.0–30.0)

## 2016-03-13 ENCOUNTER — Other Ambulatory Visit: Payer: Self-pay | Admitting: Internal Medicine

## 2016-03-13 ENCOUNTER — Encounter: Payer: Self-pay | Admitting: Internal Medicine

## 2016-03-13 DIAGNOSIS — N289 Disorder of kidney and ureter, unspecified: Principal | ICD-10-CM

## 2016-03-13 DIAGNOSIS — N189 Chronic kidney disease, unspecified: Secondary | ICD-10-CM

## 2016-03-20 ENCOUNTER — Other Ambulatory Visit (INDEPENDENT_AMBULATORY_CARE_PROVIDER_SITE_OTHER): Payer: Medicare Other

## 2016-03-20 DIAGNOSIS — N189 Chronic kidney disease, unspecified: Secondary | ICD-10-CM | POA: Diagnosis not present

## 2016-03-20 DIAGNOSIS — N289 Disorder of kidney and ureter, unspecified: Secondary | ICD-10-CM

## 2016-03-20 LAB — BASIC METABOLIC PANEL
BUN: 15 mg/dL (ref 6–23)
CHLORIDE: 104 meq/L (ref 96–112)
CO2: 29 meq/L (ref 19–32)
CREATININE: 1.5 mg/dL (ref 0.40–1.50)
Calcium: 9.5 mg/dL (ref 8.4–10.5)
GFR: 49.01 mL/min — ABNORMAL LOW (ref >60.00)
GLUCOSE: 156 mg/dL — AB (ref 70–99)
POTASSIUM: 4.4 meq/L (ref 3.5–5.1)
Sodium: 138 mEq/L (ref 135–145)

## 2016-03-21 DIAGNOSIS — M5431 Sciatica, right side: Secondary | ICD-10-CM | POA: Diagnosis not present

## 2016-03-21 DIAGNOSIS — M5136 Other intervertebral disc degeneration, lumbar region: Secondary | ICD-10-CM | POA: Diagnosis not present

## 2016-03-21 DIAGNOSIS — M9903 Segmental and somatic dysfunction of lumbar region: Secondary | ICD-10-CM | POA: Diagnosis not present

## 2016-03-21 DIAGNOSIS — M9902 Segmental and somatic dysfunction of thoracic region: Secondary | ICD-10-CM | POA: Diagnosis not present

## 2016-03-23 ENCOUNTER — Other Ambulatory Visit: Payer: Self-pay | Admitting: Internal Medicine

## 2016-03-28 ENCOUNTER — Ambulatory Visit: Payer: Medicare Other | Admitting: Cardiovascular Disease

## 2016-03-31 ENCOUNTER — Other Ambulatory Visit: Payer: Self-pay | Admitting: Internal Medicine

## 2016-04-01 DIAGNOSIS — N183 Chronic kidney disease, stage 3 (moderate): Secondary | ICD-10-CM | POA: Diagnosis not present

## 2016-04-01 DIAGNOSIS — I10 Essential (primary) hypertension: Secondary | ICD-10-CM | POA: Diagnosis not present

## 2016-04-01 DIAGNOSIS — R600 Localized edema: Secondary | ICD-10-CM | POA: Diagnosis not present

## 2016-04-03 DIAGNOSIS — G20 Parkinson's disease: Secondary | ICD-10-CM | POA: Diagnosis not present

## 2016-04-03 DIAGNOSIS — G4752 REM sleep behavior disorder: Secondary | ICD-10-CM | POA: Diagnosis not present

## 2016-04-13 ENCOUNTER — Other Ambulatory Visit: Payer: Self-pay | Admitting: Internal Medicine

## 2016-04-17 DIAGNOSIS — E119 Type 2 diabetes mellitus without complications: Secondary | ICD-10-CM | POA: Diagnosis not present

## 2016-04-18 DIAGNOSIS — M5431 Sciatica, right side: Secondary | ICD-10-CM | POA: Diagnosis not present

## 2016-04-18 DIAGNOSIS — M9902 Segmental and somatic dysfunction of thoracic region: Secondary | ICD-10-CM | POA: Diagnosis not present

## 2016-04-18 DIAGNOSIS — M9903 Segmental and somatic dysfunction of lumbar region: Secondary | ICD-10-CM | POA: Diagnosis not present

## 2016-04-18 DIAGNOSIS — M5136 Other intervertebral disc degeneration, lumbar region: Secondary | ICD-10-CM | POA: Diagnosis not present

## 2016-04-28 ENCOUNTER — Other Ambulatory Visit: Payer: Self-pay

## 2016-04-28 DIAGNOSIS — N401 Enlarged prostate with lower urinary tract symptoms: Secondary | ICD-10-CM

## 2016-04-28 MED ORDER — TAMSULOSIN HCL 0.4 MG PO CAPS: 0.4000 mg | ORAL_CAPSULE | Freq: Every day | ORAL | 3 refills | Status: DC

## 2016-05-16 DIAGNOSIS — M5136 Other intervertebral disc degeneration, lumbar region: Secondary | ICD-10-CM | POA: Diagnosis not present

## 2016-05-16 DIAGNOSIS — M9902 Segmental and somatic dysfunction of thoracic region: Secondary | ICD-10-CM | POA: Diagnosis not present

## 2016-05-16 DIAGNOSIS — M5431 Sciatica, right side: Secondary | ICD-10-CM | POA: Diagnosis not present

## 2016-05-16 DIAGNOSIS — M9903 Segmental and somatic dysfunction of lumbar region: Secondary | ICD-10-CM | POA: Diagnosis not present

## 2016-06-13 DIAGNOSIS — M5431 Sciatica, right side: Secondary | ICD-10-CM | POA: Diagnosis not present

## 2016-06-13 DIAGNOSIS — M9903 Segmental and somatic dysfunction of lumbar region: Secondary | ICD-10-CM | POA: Diagnosis not present

## 2016-06-13 DIAGNOSIS — M9902 Segmental and somatic dysfunction of thoracic region: Secondary | ICD-10-CM | POA: Diagnosis not present

## 2016-06-13 DIAGNOSIS — M5136 Other intervertebral disc degeneration, lumbar region: Secondary | ICD-10-CM | POA: Diagnosis not present

## 2016-07-11 DIAGNOSIS — M9902 Segmental and somatic dysfunction of thoracic region: Secondary | ICD-10-CM | POA: Diagnosis not present

## 2016-07-11 DIAGNOSIS — M9903 Segmental and somatic dysfunction of lumbar region: Secondary | ICD-10-CM | POA: Diagnosis not present

## 2016-07-11 DIAGNOSIS — M5431 Sciatica, right side: Secondary | ICD-10-CM | POA: Diagnosis not present

## 2016-07-11 DIAGNOSIS — M5136 Other intervertebral disc degeneration, lumbar region: Secondary | ICD-10-CM | POA: Diagnosis not present

## 2016-07-31 ENCOUNTER — Other Ambulatory Visit: Payer: Self-pay

## 2016-07-31 DIAGNOSIS — N401 Enlarged prostate with lower urinary tract symptoms: Secondary | ICD-10-CM

## 2016-07-31 MED ORDER — TAMSULOSIN HCL 0.4 MG PO CAPS: 0.4000 mg | ORAL_CAPSULE | Freq: Every day | ORAL | 6 refills | Status: DC

## 2016-08-05 ENCOUNTER — Ambulatory Visit (INDEPENDENT_AMBULATORY_CARE_PROVIDER_SITE_OTHER): Payer: Medicare Other

## 2016-08-05 VITALS — BP 130/62 | HR 51 | Temp 97.6°F | Resp 14 | Ht 67.0 in | Wt 195.8 lb

## 2016-08-05 DIAGNOSIS — Z0000 Encounter for general adult medical examination without abnormal findings: Secondary | ICD-10-CM

## 2016-08-05 NOTE — Patient Instructions (Addendum)
  Mr. Johnathan Arnold , Thank you for taking time to come for your Medicare Wellness Visit. I appreciate your ongoing commitment to your health goals. Please review the following plan we discussed and let me know if I can assist you in the future.   Follow up with Dr. Derrel Nip as needed.    Bring a copy of your [place] and/or Living Will to be scanned into chart.  Have a great day!  These are the goals we discussed: Goals    . Increase lean proteins          Low carb foods. Educational material provided.       This is a list of the screening recommended for you and due dates:  Health Maintenance  Topic Date Due  . Complete foot exam   02/28/2016  . Hemoglobin A1C  09/10/2016  . Eye exam for diabetics  05/20/2017  . Colon Cancer Screening  06/24/2019  . Tetanus Vaccine  12/18/2020  . Flu Shot  Completed  .  Hepatitis C: One time screening is recommended by Center for Disease Control  (CDC) for  adults born from [birth year] through 1966.   Completed  . Pneumonia vaccines  Completed

## 2016-08-05 NOTE — Progress Notes (Signed)
Care was provided under my supervision. I agree with the management as indicated in the note.  Destiny Hagin DO  

## 2016-08-05 NOTE — Progress Notes (Signed)
Subjective:   Johnathan Arnold. is a 71 y.o. male who presents for Medicare Annual/Subsequent preventive examination.  Review of Systems:  No ROS.  Medicare Wellness Visit.  Cardiac Risk Factors include: advanced age (>46men, >7 women);male gender;diabetes mellitus;obesity (BMI >30kg/m2)     Objective:    Vitals: BP 130/62 (BP Location: Left Arm, Patient Position: Sitting, Cuff Size: Normal)   Pulse (!) 51   Temp 97.6 F (36.4 C) (Oral)   Resp 14   Ht 5\' 7"  (1.702 m)   Wt 195 lb 12.8 oz (88.8 kg)   SpO2 97%   BMI 30.67 kg/m   Body mass index is 30.67 kg/m.  Tobacco History  Smoking Status  . Never Smoker  Smokeless Tobacco  . Never Used     Counseling given: Not Answered   Past Medical History:  Diagnosis Date  . 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 Diabetes mellitus without complication ([place])   . History of cardiac catheterization 2011   [facility]  . Hyperlipidemia   . Hypertension   . Hypertriglyceridemia   . 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 Vertigo    Past Surgical History:  Procedure Laterality Date  . CARDIAC CATHETERIZATION  05-19-2010   [facility]: Patent grafts. LIMA to LAD, SVG to D1, OM1 and RPDA  . CORONARY ARTERY BYPASS GRAFT  03/1998   5 vessel, [facility]   Family History  Problem Relation Age of Onset  . Heart attack Mother 32  . Hypertension Mother   . Heart attack Father 23  . Heart disease Father   . Heart disease Brother    History  Sexual Activity  . Sexual activity: Not Currently    Outpatient Encounter Prescriptions as of 08/05/2016  Medication Sig  . aspirin 81 MG tablet Take by mouth. Takes 2 tablets daily.  . carbidopa-levodopa (SINEMET IR) 25-100 MG tablet Take 1 tablet by mouth 3 (three) times daily.  . carvedilol (COREG) 6.25 MG tablet TAKE 1 TABLET BY MOUTH TWICE DAILY  . clobetasol cream (TEMOVATE) [id number] % Apply 1 application topically 2 (two) times daily. Apply medication once a week  . diazepam (VALIUM) 5  MG tablet Take 5 mg by mouth as needed.   . isosorbide mononitrate (IMDUR) 60 MG 24 hr tablet TAKE 1 TABLET BY MOUTH EVERY DAY. ALSO TAKE 1 30 MG TABLET EVERY DAY  . losartan (COZAAR) 25 MG tablet TAKE 1 TABLET BY MOUTH EVERY DAY  . Multiple Vitamin (MULTIVITAMIN) tablet Take 1 tablet by mouth daily.    . nitroGLYCERIN (NITROSTAT) 0.4 MG SL tablet Place under the tongue.  . Omega-3 Fatty Acids (FISH OIL) 1200 MG CAPS Take by mouth daily.  Marland Kitchen omeprazole (PRILOSEC) 20 MG capsule TAKE 1 CAPSULE BY MOUTH EVERY MORNING AS DIRECTED  . polyethylene glycol (MIRALAX / GLYCOLAX) packet Take by mouth.  . ranolazine (RANEXA) 1000 MG SR tablet TAKE 1 TABLET(1000 MG) BY MOUTH TWICE DAILY  . simvastatin (ZOCOR) 20 MG tablet TAKE 1 TABLET BY MOUTH EVERY NIGHT AT BEDTIME  . tamsulosin (FLOMAX) 0.4 MG CAPS capsule Take 1 capsule (0.4 mg total) by mouth daily.  Marland Kitchen zolpidem (AMBIEN) 10 MG tablet TAKE 1 TABLET BY MOUTH EVERY NIGHT AT BEDTIME AS NEEDED FOR SLEEP  . [DISCONTINUED] omeprazole (PRILOSEC) 20 MG capsule TAKE 1 CAPSULE BY MOUTH EVERY MORNING AS DIRECTED   No facility-administered encounter medications on file as of 08/05/2016.     Activities of Daily  Living In your present state of health, do you have any difficulty performing the following activities: 08/05/2016  Hearing? Y  Vision? N  Difficulty concentrating or making decisions? N  Walking or climbing stairs? N  Dressing or bathing? N  Doing errands, shopping? N  Preparing Food and eating ? N  Using the Toilet? N  In the past six months, have you accidently leaked urine? N  Do you have problems with loss of bowel control? N  Managing your Medications? N  Managing your Finances? N  Housekeeping or managing your Housekeeping? N  Some recent data might be hidden    Patient Care Team: [name], MD as PCP - General (Internal Medicine) Isaias Cowman, MD (Internal Medicine)   Assessment:    This is a routine wellness examination for  Johnathan Arnold. The goal of the wellness visit is to assist the patient how to close the gaps in care and create a preventative care plan for the patient.   Osteoporosis risk reviewed.  Medications reviewed; taking without issues or barriers.  Safety issues reviewed; smoke detectors in the home. Firearms locked up in the home. Wears seatbelts when driving or riding with others. Patient does wear sunscreen or protective clothing when in direct sunlight. No violence in the home.  Patient is alert, normal appearance, oriented to person/place/and time. Correctly identified the president of the [place], recall of 3/3 objects, and performing simple calculations.  Patient displays appropriate judgement and can read correct time from watch face.  No new identified risk were noted.  No failures at ADL's or IADL's.   BMI- discussed the importance of a healthy diet, water intake and exercise. Educational material provided.   Diet: Breakfast: Dines out 4/7 days. (Breakfast platter) Eggs, bacon, biscuit, pancakes Lunch: Sandwich Dinner: Dines out 5/7 days.  Meal choices range.  No detail provided. Daily fluid intake: 6 cups of water Encouraged low carb foods.  Educational material provided.  DM- He does not monitor BS.  Encouraged to monitor.  HTN- followed by PCP.  Dental- every six months. Dr. Juanda [facility])  Eye- Visual acuity not assessed per patient preference since they have regular follow up with the ophthalmologist.  Wears corrective lenses.  Sleep patterns- Sleeps 7-8 hours at night.  Wakes feeling rested.  Patient Concerns: None at this time. Follow up with PCP as needed.  Exercise Activities and Dietary recommendations Current Exercise Habits: Structured exercise class (Attends [employer] Track), Type of exercise: treadmill;strength training/weights, Frequency (Times/Week): 3, Intensity: Moderate  Goals    . Increase lean proteins          Low carb foods. Educational material  provided.      Fall Risk Fall Risk  08/05/2016 03/03/2015 10/07/2013 06/23/2012  Falls in the past year? No No No No   Depression Screen PHQ 2/9 Scores 08/05/2016 03/03/2015 10/07/2013 06/23/2012  PHQ - 2 Score 0 0 0 0    Cognitive Function MMSE - Mini Mental State Exam 08/05/2016  Orientation to time 5  Orientation to Place 5  Registration 3  Attention/ Calculation 5  Recall 3  Language- name 2 objects 2  Language- repeat 1  Language- follow 3 step command 3  Language- read & follow direction 1  Write a sentence 1  Copy design 1  Total score 30        Immunization History  Administered Date(s) Administered  . Influenza Split 02/19/2011, 04/10/2014  . Influenza, High Dose Seasonal PF 04/01/2013, 02/28/2015  . Influenza,inj,Quad PF,36+ Mos  03/10/2016  . Influenza-Unspecified 03/09/2012  . Pneumococcal Conjugate-13 06/30/2013  . Pneumococcal Polysaccharide-23 05/21/2011  . Tdap 12/19/2010  . Zoster 05/09/2012   Screening Tests Health Maintenance  Topic Date Due  . FOOT EXAM  02/28/2016  . HEMOGLOBIN A1C  09/10/2016  . OPHTHALMOLOGY EXAM  05/20/2017  . COLONOSCOPY  06/24/2019  . TETANUS/TDAP  12/18/2020  . INFLUENZA VACCINE  Completed  . Hepatitis C Screening  Completed  . PNA vac Low Risk Adult  Completed      Plan:    End of life planning; Advance aging; Advanced directives discussed. Copy of current HCPOA/Living Will requested.    Medicare Attestation I have personally reviewed: The patient's medical and social history Their use of alcohol, tobacco or illicit drugs Their current medications and supplements The patient's functional ability including ADLs,fall risks, home safety risks, cognitive, and hearing and visual impairment Diet and physical activities Evidence for depression   The patient's weight, height, BMI, and visual acuity have been recorded in the chart.  I have made referrals and provided education to the patient based on review of the above and  I have provided the patient with a written personalized care plan for preventive services.    During the course of the visit the patient was educated and counseled about the following appropriate screening and preventive services:   Vaccines to include Pneumoccal, Influenza, Hepatitis B, Td, Zostavax, HCV  Colorectal cancer screening- UTD  Diabetes mellitus without complication   Prostate Cancer Screening- last lab 02/28/16, 1.2  Glaucoma screening-keep annual exams  Nutrition counseling-educational material provided  Patient Instructions (the written plan) was given to the patient.    Varney Biles, LPN  [id number]

## 2016-08-06 ENCOUNTER — Encounter: Payer: Self-pay | Admitting: Internal Medicine

## 2016-08-06 ENCOUNTER — Ambulatory Visit (INDEPENDENT_AMBULATORY_CARE_PROVIDER_SITE_OTHER): Payer: Medicare Other | Admitting: Internal Medicine

## 2016-08-06 VITALS — BP 118/66 | HR 55 | Resp 16 | Wt 197.0 lb

## 2016-08-06 DIAGNOSIS — Z79899 Other long term (current) drug therapy: Secondary | ICD-10-CM | POA: Diagnosis not present

## 2016-08-06 DIAGNOSIS — E6609 Other obesity due to excess calories: Secondary | ICD-10-CM

## 2016-08-06 DIAGNOSIS — E611 Iron deficiency: Secondary | ICD-10-CM

## 2016-08-06 DIAGNOSIS — R5383 Other fatigue: Secondary | ICD-10-CM | POA: Diagnosis not present

## 2016-08-06 DIAGNOSIS — N179 Acute kidney failure, unspecified: Secondary | ICD-10-CM

## 2016-08-06 DIAGNOSIS — I10 Essential (primary) hypertension: Secondary | ICD-10-CM | POA: Diagnosis not present

## 2016-08-06 DIAGNOSIS — G2581 Restless legs syndrome: Secondary | ICD-10-CM

## 2016-08-06 DIAGNOSIS — Z85828 Personal history of other malignant neoplasm of skin: Secondary | ICD-10-CM

## 2016-08-06 DIAGNOSIS — E1121 Type 2 diabetes mellitus with diabetic nephropathy: Secondary | ICD-10-CM

## 2016-08-06 DIAGNOSIS — E7800 Pure hypercholesterolemia, unspecified: Secondary | ICD-10-CM

## 2016-08-06 DIAGNOSIS — G20 Parkinson's disease: Secondary | ICD-10-CM | POA: Diagnosis not present

## 2016-08-06 DIAGNOSIS — Z23 Encounter for immunization: Secondary | ICD-10-CM | POA: Diagnosis not present

## 2016-08-06 DIAGNOSIS — E559 Vitamin D deficiency, unspecified: Secondary | ICD-10-CM | POA: Diagnosis not present

## 2016-08-06 DIAGNOSIS — Z6830 Body mass index (BMI) 30.0-30.9, adult: Secondary | ICD-10-CM

## 2016-08-06 LAB — TSH: TSH: 2.61 u[IU]/mL (ref 0.35–4.50)

## 2016-08-06 LAB — CBC WITH DIFFERENTIAL/PLATELET
BASOS PCT: 0.6 % (ref 0.0–3.0)
Basophils Absolute: 0 10*3/uL (ref 0.0–0.1)
Eosinophils Absolute: 0 10*3/uL (ref 0.0–0.7)
Eosinophils Relative: 0.9 % (ref 0.0–5.0)
HEMATOCRIT: 36.2 % — AB (ref 39.0–52.0)
Hemoglobin: 12.4 g/dL — ABNORMAL LOW (ref 13.0–17.0)
LYMPHS PCT: 33.4 % (ref 12.0–46.0)
Lymphs Abs: 1.5 10*3/uL (ref 0.7–4.0)
MCHC: 34.1 g/dL (ref 30.0–36.0)
MCV: 99.8 fl (ref 78.0–100.0)
MONOS PCT: 7.2 % (ref 3.0–12.0)
Monocytes Absolute: 0.3 10*3/uL (ref 0.1–1.0)
NEUTROS ABS: 2.6 10*3/uL (ref 1.4–7.7)
Neutrophils Relative %: 57.9 % (ref 43.0–77.0)
PLATELETS: 166 10*3/uL (ref 150.0–400.0)
RBC: 3.63 Mil/uL — ABNORMAL LOW (ref 4.22–5.81)
RDW: 12.6 % (ref 11.5–15.5)
WBC: 4.5 10*3/uL (ref 4.0–10.5)

## 2016-08-06 LAB — FERRITIN: Ferritin: 84.4 ng/mL (ref 22.0–322.0)

## 2016-08-06 LAB — COMPREHENSIVE METABOLIC PANEL
ALBUMIN: 4.7 g/dL (ref 3.5–5.2)
ALK PHOS: 47 U/L (ref 39–117)
ALT: 11 U/L (ref 0–53)
AST: 18 U/L (ref 0–37)
BUN: 19 mg/dL (ref 6–23)
CALCIUM: 9.4 mg/dL (ref 8.4–10.5)
CHLORIDE: 102 meq/L (ref 96–112)
CO2: 29 mEq/L (ref 19–32)
CREATININE: 1.75 mg/dL — AB (ref 0.40–1.50)
GFR: 40.98 mL/min — ABNORMAL LOW (ref >60.00)
Glucose, Bld: 103 mg/dL — ABNORMAL HIGH (ref 70–99)
Potassium: 4.6 mEq/L (ref 3.5–5.1)
Sodium: 137 mEq/L (ref 135–145)
TOTAL PROTEIN: 6.8 g/dL (ref 6.0–8.3)
Total Bilirubin: 0.8 mg/dL (ref 0.2–1.2)

## 2016-08-06 LAB — MICROALBUMIN / CREATININE URINE RATIO
CREATININE, U: 75.7 mg/dL
MICROALB UR: 1.2 mg/dL (ref 0.0–1.9)
MICROALB/CREAT RATIO: 1.6 mg/g (ref 0.0–30.0)

## 2016-08-06 LAB — POCT GLYCOSYLATED HEMOGLOBIN (HGB A1C): Hemoglobin A1C: 5.6

## 2016-08-06 LAB — VITAMIN D 25 HYDROXY (VIT D DEFICIENCY, FRACTURES): VITD: 38.95 ng/mL (ref 30.00–100.00)

## 2016-08-06 LAB — LIPID PANEL
CHOLESTEROL: 117 mg/dL (ref 0–200)
HDL: 59.2 mg/dL (ref >39.00)
LDL CALC: 45 mg/dL (ref 0–99)
NonHDL: 57.97
TRIGLYCERIDES: 66 mg/dL (ref 0.0–149.0)
Total CHOL/HDL Ratio: 2
VLDL: 13.2 mg/dL (ref 0.0–40.0)

## 2016-08-06 NOTE — Progress Notes (Signed)
Pre visit review using our clinic review tool, if applicable. No additional management support is needed unless otherwise documented below in the visit note. 

## 2016-08-06 NOTE — Progress Notes (Addendum)
Subjective:  Patient ID: Johnathan Arnold., male    DOB: 1946-01-14  Age: 71 y.o. MRN: [id number]  CC: The primary encounter diagnosis was Parkinson's disease ([place]). 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  HPI Johnathan Arnold. presents for follow up on type 2 dm, and other chronoic conditions  He was last seen  In Feb 2017 and was referred to Dr. Manuella Ghazi (neurology) for evaluation of resting tremor .  He was diagnosed with the suspected Parkinson's Disease  In June and Sinemet was started.  He is requesting a second opinion from a neurologist that specializes in  movement disorders.   He reports a feeling of restless legs when sitting In his recliner.  The symptom improves with activity,  Sometimes disrupts his sleep but not always.   Type 2 DM:  Not checking sugars .  Following a low G I diet.   CAD:  His legs feel fatigued with short walks, but he has not had shortness of breath, chest pain, or leg cramping  CKD:  Sees Nephrology semi annually,  hctz was resumed prn for leg edema.     Foot exam normal    Outpatient Medications Prior to Visit  Medication Sig Dispense Refill  . aspirin 81 MG tablet Take by mouth. Takes 2 tablets daily.    . carbidopa-levodopa (SINEMET IR) 25-100 MG tablet Take 1 tablet by mouth 3 (three) times daily.    . carvedilol (COREG) 6.25 MG tablet TAKE 1 TABLET BY MOUTH TWICE DAILY 180 tablet 3  . clobetasol cream  (TEMOVATE) [id number] % Apply 1 application topically 2 (two) times daily. Apply medication once a week 30 g 2  . diazepam (VALIUM) 5 MG tablet Take 5 mg by mouth as needed.     . isosorbide mononitrate (IMDUR) 60 MG 24 hr tablet TAKE 1 TABLET BY MOUTH EVERY DAY. ALSO TAKE 1 30 MG TABLET EVERY DAY 90 tablet 3  . losartan (COZAAR) 25 MG tablet TAKE 1 TABLET BY MOUTH EVERY DAY 90 tablet 1  . Multiple Vitamin (MULTIVITAMIN) tablet Take 1 tablet by mouth daily.      . nitroGLYCERIN (NITROSTAT) 0.4 MG SL tablet Place under the tongue.    . Omega-3 Fatty Acids (FISH OIL) 1200 MG CAPS Take by mouth daily.    Marland Kitchen omeprazole (PRILOSEC) 20 MG capsule TAKE 1 CAPSULE BY MOUTH EVERY MORNING AS DIRECTED 90 capsule 0  . polyethylene glycol (MIRALAX / GLYCOLAX) packet Take by mouth.    . ranolazine (RANEXA) 1000 MG SR tablet TAKE 1 TABLET(1000 MG) BY MOUTH TWICE DAILY 60 tablet 11  . simvastatin (ZOCOR) 20 MG tablet TAKE 1 TABLET BY MOUTH EVERY NIGHT AT BEDTIME 90 tablet 3  . tamsulosin (FLOMAX) 0.4 MG CAPS capsule Take 1 capsule (0.4 mg total) by mouth daily. 30 capsule 6  . zolpidem (AMBIEN) 10 MG tablet TAKE 1 TABLET BY MOUTH EVERY NIGHT AT BEDTIME AS NEEDED FOR SLEEP 30 tablet 0   No facility-administered medications prior to visit.     Review of Systems;  Patient denies headache, fevers, malaise, unintentional weight loss, skin rash, eye pain, sinus congestion and sinus pain, sore throat, dysphagia,  hemoptysis , cough, dyspnea, wheezing, chest pain, palpitations, orthopnea, edema, abdominal pain, nausea, melena, diarrhea, constipation, flank pain, dysuria, hematuria, urinary  Frequency, nocturia, numbness, tingling, seizures,  Focal weakness, Loss of consciousness,  , insomnia, depression, anxiety, and suicidal ideation.      Objective:  BP 118/66   Pulse (!) 55   Resp 16   Wt 197 lb (89.4 kg)   SpO2 96%   BMI 30.85 kg/m   BP Readings from Last 3 Encounters:  08/06/16 118/66  08/05/16 130/62    03/10/16 (!) 120/54    Wt Readings from Last 3 Encounters:  08/06/16 197 lb (89.4 kg)  08/05/16 195 lb 12.8 oz (88.8 kg)  03/10/16 195 lb 12 oz (88.8 kg)    General appearance: alert, cooperative and appears stated age Ears: normal TM's and external ear canals both ears Throat: lips, mucosa, and tongue normal; teeth and gums normal Neck: no adenopathy, no carotid bruit, supple, symmetrical, trachea midline and thyroid not enlarged, symmetric, no tenderness/mass/nodules Back: symmetric, no curvature. ROM normal. No CVA tenderness. Lungs: clear to auscultation bilaterally Heart: regular rate and rhythm, S1, S2 normal, no murmur, click, rub or gallop Abdomen: soft, non-tender; bowel sounds normal; no masses,  no organomegaly Pulses: 2+ and symmetric Skin: hyperpigmented macular scar at base of neck.  No rashes or lesions Lymph nodes: Cervical, supraclavicular, and axillary nodes normal. Neuro: CNs 2-12 intact. DTRs 2+/4 in biceps, brachioradialis, patellars and achilles. Muscle strength 5/5 in upper and lower exremities. Pill rolling tremor bilaterally, R> L both hands cerebellar function normal. Romberg negative.  No pronator drift.   Gait normal.   Lab Results  Component Value Date   HGBA1C 5.6 08/06/2016   HGBA1C 5.8 03/12/2016   HGBA1C 6.1 08/06/2015    Lab Results  Component Value Date   CREATININE 1.75 (H) 08/06/2016   CREATININE 1.50 03/20/2016   CREATININE 1.68 (H) 03/12/2016    Lab Results  Component Value Date   WBC 4.5 08/06/2016   HGB 12.4 (L) 08/06/2016   HCT 36.2 (L) 08/06/2016   PLT 166.0 08/06/2016   GLUCOSE 103 (H) 08/06/2016   CHOL 117 08/06/2016   TRIG 66.0 08/06/2016   HDL 59.20 08/06/2016   LDLDIRECT 38.0 08/06/2015   LDLCALC 45 08/06/2016   ALT 11 08/06/2016   AST 18 08/06/2016   NA 137 08/06/2016   K 4.6 08/06/2016   CL 102 08/06/2016   CREATININE 1.75 (H) 08/06/2016   BUN 19 08/06/2016   CO2 29 08/06/2016   TSH 2.61 08/06/2016   PSA 1.08  08/01/2014   HGBA1C 5.6 08/06/2016   MICROALBUR 1.2 08/06/2016    Ct Temporal Bones W/o Cm  Result Date: 01/24/2015 CLINICAL DATA:  Dizziness for 5 months. EXAM: CT TEMPORAL BONES WITHOUT CONTRAST TECHNIQUE: Axial and coronal plane CT imaging of the petrous temporal bones was performed with thin-collimation image reconstruction. No intravenous contrast was administered. Multiplanar CT image reconstructions were also generated. COMPARISON:  Brain MRI 01/15/2015 FINDINGS: RIGHT: The external auditory canal and tympanic membrane are unremarkable. The ossicles appear intact. The tympanic cavity is clear. The internal auditory canal, cochlea, vestibule, and semicircular canals are unremarkable. Within the superolateral aspect of the mastoid bone corresponding to the enhancement on prior MRI is a region of heterogeneous sclerosis and some ground-glass attenuation measuring approximately 2 cm. No aggressive/ destructive osseous changes are seen, and  the tegmen mastoideum is intact. There is opacification of a few mastoid air cells more inferiorly. LEFT: The external auditory canal and tympanic membrane are unremarkable. The ossicles appear intact. The tympanic cavity is clear. The internal auditory canal, cochlea, vestibule, and semicircular canals are unremarkable. The mastoid air cells are clear. There is minimal mucosal thickening in the maxillary sinuses. The visualized portion of the brain is unremarkable. Visualized orbits are unremarkable. IMPRESSION: 1. 2 cm region of heterogeneous sclerosis in the superolateral right mastoid bone corresponding to the enhancement on prior MRI. This is of uncertain etiology but has a benign/indolent appearance and is felt unlikely to be the cause of the patient's dizziness. Possible considerations include localized Paget's, fibrous dysplasia, osseous hemangioma, or sequelae of chronic inflammation/infection. 2. Otherwise unremarkable appearance of the temporal bones.  Electronically Signed   By: Logan Bores   On: 01/24/2015 15:34    Assessment & Plan:   Problem List Items Addressed This Visit    Acute renal failure ([place])    Recurrent,  With CKD attributed t diabetes/hypertension. Current drop in gfr Likely due to fasting and use of hctz.  rtc 2 weeks for recheck.  Sees [facility] semi annually       Relevant Orders   Basic metabolic panel   History of skin cancer in adulthood    Referral to Dr Evorn Gong for follow up .       Relevant Orders   Ambulatory referral to Dermatology   Hyperlipidemia    LDL is at  goal on current medications. He has no side effects and liver enzymes are normal. No changes today.  Lab Results  Component Value Date   CHOL 117 08/06/2016   HDL 59.20 08/06/2016   LDLCALC 45 08/06/2016   LDLDIRECT 38.0 08/06/2015   TRIG 66.0 08/06/2016   CHOLHDL 2 08/06/2016   Lab Results  Component Value Date   ALT 11 08/06/2016   AST 18 08/06/2016   ALKPHOS 47 08/06/2016   BILITOT 0.8 08/06/2016            Relevant Orders   Lipid panel (Completed)   Hypertension    Well controlled on current regimen. Renal function worsening ,  Will suspend hctz and repeat  in  2 Weeks,  Lab Results  Component Value Date   CREATININE 1.75 (H) 08/06/2016   Lab Results  Component Value Date   NA 137 08/06/2016   K 4.6 08/06/2016   CL 102 08/06/2016   CO2 29 08/06/2016  '      Obesity    I have addressed  BMI and recommended  that patient start exercising with a goal of 30 minutes of aerobic exercise a minimum of 5 days per week.       Parkinson's disease [facility]) - Primary    Diagnosed by Dr. Manuella Ghazi June 2017.  Sinemet started.  He has not  Started any physical therapy yet and would liek a second opinion,  Referral to [employer].      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 Mild proteinuria,  On ARB  Per nephrology,  Asa,  Statin.   He is  up to date on eye and foot exams.  Lab Results  Component Value Date   HGBA1C 5.6 08/06/2016   Lab Results  Component Value Date   MICROALBUR 1.2 08/06/2016  Relevant Orders   POCT glycosylated hemoglobin (Hb A1C) (Completed)   Microalbumin / creatinine urine ratio (Completed)    Other Visit Diagnoses    Restless legs       Long-term use of high-risk medication       Relevant Orders   Comprehensive metabolic panel (Completed)   Fatigue, unspecified type       Relevant Orders   Ferritin (Completed)   Iron and TIBC (Completed)   TSH (Completed)   Iron deficiency       Relevant Orders   Ferritin (Completed)   Iron and TIBC (Completed)   CBC with Differential/Platelet (Completed)   Vitamin D deficiency       Relevant Orders   VITAMIN D 25 Hydroxy (Vit-D Deficiency, Fractures) (Completed)   Need for prophylactic vaccination against Streptococcus pneumoniae (pneumococcus)       Relevant Orders   Pneumococcal polysaccharide vaccine 23-valent greater than or equal to 2yo subcutaneous/IM (Completed)     A total of 40 minutes was spent with patient more than half of which was spent in counseling patient on the above mentioned issues , reviewing and explaining recent labs and imaging studies done, and coordination of care.  I am having Mr. Jesberger maintain his multivitamin, aspirin, Fish Oil, omeprazole, clobetasol cream, isosorbide mononitrate, zolpidem, simvastatin, carvedilol, diazepam, nitroGLYCERIN, polyethylene glycol, carbidopa-levodopa, ranolazine, losartan, and tamsulosin.  No orders of the defined types were placed in this encounter.   There are no discontinued medications.  Follow-up: Return in about 6 months (around 02/03/2017).   [name], MD

## 2016-08-06 NOTE — Patient Instructions (Signed)
The ShingRx vaccine will be available in about 6 months and IS ADVISED for all interested adults over 50 to prevent shingles   I am referring you to Wells Guiles Tat ([facility] neurologist who specializes in movement disorders)  I HAVE SEEN THE BOXING WORK WONDERS IN PATIENTS WITH PARKINSON'S DISEASE.  START NOW!   Health Maintenance, Male A healthy lifestyle and preventive care is important for your health and wellness. Ask your health care provider about what schedule of regular examinations is right for you. What should I know about weight and diet?  Eat a Healthy Diet  Eat plenty of vegetables, fruits, whole grains, low-fat dairy products, and lean protein.  Do not eat a lot of foods high in solid fats, added sugars, or salt. Maintain a Healthy Weight  Regular exercise can help you achieve or maintain a healthy weight. You should:  Do at least 150 minutes of exercise each week. The exercise should increase your heart rate and make you sweat (moderate-intensity exercise).  Do strength-training exercises at least twice a week. Watch Your Levels of Cholesterol and Blood Lipids  Have your blood tested for lipids and cholesterol every 5 years starting at 72 years of age. If you are at high risk for heart disease, you should start having your blood tested when you are 72 years old. You may need to have your cholesterol levels checked more often if:  Your lipid or cholesterol levels are high.  You are older than 73 years of age.  You are at high risk for heart disease. What should I know about cancer screening? Many types of cancers can be detected early and may often be prevented. Lung Cancer  You should be screened every year for lung cancer if:  You are a current smoker who has smoked for at least 30 years.  You are a former smoker who has quit within the past 15 years.  Talk to your health care provider about your screening options, when you should start screening, and how often  you should be screened. Colorectal Cancer  Routine colorectal cancer screening usually begins at 73 years of age and should be repeated every 5-10 years until you are 72 years old. You may need to be screened more often if early forms of precancerous polyps or small growths are found. Your health care provider may recommend screening at an earlier age if you have risk factors for colon cancer.  Your health care provider may recommend using home test kits to check for hidden blood in the stool.  A small camera at the end of a tube can be used to examine your colon (sigmoidoscopy or colonoscopy). This checks for the earliest forms of colorectal cancer. Prostate and Testicular Cancer  Depending on your age and overall health, your health care provider may do certain tests to screen for prostate and testicular cancer.  Talk to your health care provider about any symptoms or concerns you have about testicular or prostate cancer. Skin Cancer  Check your skin from head to toe regularly.  Tell your health care provider about any new moles or changes in moles, especially if:  There is a change in a mole's size, shape, or color.  You have a mole that is larger than a pencil eraser.  Always use sunscreen. Apply sunscreen liberally and repeat throughout the day.  Protect yourself by wearing long sleeves, pants, a wide-brimmed hat, and sunglasses when outside. What should I know about heart disease, diabetes, and high blood pressure?  If you are 56-44 years of age, have your blood pressure checked every 3-5 years. If you are 41 years of age or older, have your blood pressure checked every year. You should have your blood pressure measured twice-once when you are at a hospital or clinic, and once when you are not at a hospital or clinic. Record the average of the two measurements. To check your blood pressure when you are not at a hospital or clinic, you can use:  An automated blood pressure machine  at a pharmacy.  A home blood pressure monitor.  Talk to your health care provider about your target blood pressure.  If you are between 53-88 years old, ask your health care provider if you should take aspirin to prevent heart disease.  Have regular diabetes screenings by checking your fasting blood sugar level.  If you are at a normal weight and have a low risk for diabetes, have this test once every three years after the age of 28.  If you are overweight and have a high risk for diabetes, consider being tested at a younger age or more often.  A one-time screening for abdominal aortic aneurysm (AAA) by ultrasound is recommended for men aged 65-75 years who are current or former smokers. What should I know about preventing infection? Hepatitis B  If you have a higher risk for hepatitis B, you should be screened for this virus. Talk with your health care provider to find out if you are at risk for hepatitis B infection. Hepatitis C  Blood testing is recommended for:  Everyone born from [birth year] through 1966.  Anyone with known risk factors for hepatitis C. Sexually Transmitted Diseases (STDs)  You should be screened each year for STDs including gonorrhea and chlamydia if:  You are sexually active and are younger than 72 years of age.  You are older than 72 years of age and your health care provider tells you that you are at risk for this type of infection.  Your sexual activity has changed since you were last screened and you are at an increased risk for chlamydia or gonorrhea. Ask your health care provider if you are at risk.  Talk with your health care provider about whether you are at high risk of being infected with HIV. Your health care provider may recommend a prescription medicine to help prevent HIV infection. What else can I do?  Schedule regular health, dental, and eye exams.  Stay current with your vaccines (immunizations).  Do not use any tobacco products, such as  cigarettes, chewing tobacco, and e-cigarettes. If you need help quitting, ask your health care provider.  Limit alcohol intake to no more than 2 drinks per day. One drink equals 12 ounces of beer, 5 ounces of wine, or 1 ounces of hard liquor.  Do not use street drugs.  Do not share needles.  Ask your health care provider for help if you need support or information about quitting drugs.  Tell your health care provider if you often feel depressed.  Tell your health care provider if you have ever been abused or do not feel safe at home. This information is not intended to replace advice given to you by your health care provider. Make sure you discuss any questions you have with your health care provider. Document Released: 11/22/2007 Document Revised: 01/23/2016 Document Reviewed: 02/27/2015 Elsevier Interactive Patient Education  2017 [employer].

## 2016-08-07 LAB — IRON AND TIBC
%SAT: 37 % (ref 15–60)
Iron: 117 ug/dL (ref 50–180)
TIBC: 318 ug/dL (ref 250–425)
UIBC: 201 ug/dL (ref 125–400)

## 2016-08-08 DIAGNOSIS — M5136 Other intervertebral disc degeneration, lumbar region: Secondary | ICD-10-CM | POA: Diagnosis not present

## 2016-08-08 DIAGNOSIS — M5431 Sciatica, right side: Secondary | ICD-10-CM | POA: Diagnosis not present

## 2016-08-08 DIAGNOSIS — M9902 Segmental and somatic dysfunction of thoracic region: Secondary | ICD-10-CM | POA: Diagnosis not present

## 2016-08-08 DIAGNOSIS — M9903 Segmental and somatic dysfunction of lumbar region: Secondary | ICD-10-CM | POA: Diagnosis not present

## 2016-08-09 ENCOUNTER — Encounter: Payer: Self-pay | Admitting: Internal Medicine

## 2016-08-09 DIAGNOSIS — G20 Parkinson's disease: Secondary | ICD-10-CM | POA: Insufficient documentation

## 2016-08-09 DIAGNOSIS — G20A1 Parkinson's disease without dyskinesia, without mention of fluctuations: Secondary | ICD-10-CM | POA: Insufficient documentation

## 2016-08-09 NOTE — Assessment & Plan Note (Signed)
Well controlled on current regimen. Renal function worsening ,  Will suspend hctz and repeat  in  2 Weeks,  Lab Results  Component Value Date   CREATININE 1.75 (H) 08/06/2016   Lab Results  Component Value Date   NA 137 08/06/2016   K 4.6 08/06/2016   CL 102 08/06/2016   CO2 29 08/06/2016  '

## 2016-08-09 NOTE — Assessment & Plan Note (Signed)
Diagnosed by Dr. Manuella Ghazi June 2017.  Sinemet started.  He has not  Started any physical therapy yet and would liek a second opinion,  Referral to [employer].

## 2016-08-09 NOTE — Assessment & Plan Note (Signed)
LDL is at  goal on current medications. He has no side effects and liver enzymes are normal. No changes today.  Lab Results  Component Value Date   CHOL 117 08/06/2016   HDL 59.20 08/06/2016   LDLCALC 45 08/06/2016   LDLDIRECT 38.0 08/06/2015   TRIG 66.0 08/06/2016   CHOLHDL 2 08/06/2016   Lab Results  Component Value Date   ALT 11 08/06/2016   AST 18 08/06/2016   ALKPHOS 47 08/06/2016   BILITOT 0.8 08/06/2016

## 2016-08-09 NOTE — Assessment & Plan Note (Signed)
Recurrent,  With CKD attributed t diabetes/hypertension. Current drop in gfr Likely due to fasting and use of hctz.  rtc 2 weeks for recheck.  Sees [facility] semi annually

## 2016-08-09 NOTE — Assessment & Plan Note (Signed)
managd with diet alone . Mild proteinuria,  On ARB  Per nephrology,  Asa,  Statin.  He is  up to date on eye and foot exams.  Lab Results  Component Value Date   HGBA1C 5.6 08/06/2016   Lab Results  Component Value Date   MICROALBUR 1.2 08/06/2016

## 2016-08-09 NOTE — Assessment & Plan Note (Signed)
I have addressed  BMI and recommended  that patient start exercising with a goal of 30 minutes of aerobic exercise a minimum of 5 days per week.

## 2016-08-12 ENCOUNTER — Telehealth: Payer: Self-pay | Admitting: Cardiovascular Disease

## 2016-08-12 NOTE — Telephone Encounter (Signed)
Clearance request to participate in [employer] exercise program placed in MD basket.

## 2016-08-14 DIAGNOSIS — Z85828 Personal history of other malignant neoplasm of skin: Secondary | ICD-10-CM | POA: Insufficient documentation

## 2016-08-14 NOTE — Assessment & Plan Note (Signed)
Referral to Dr Evorn Gong for follow up .

## 2016-08-18 NOTE — Telephone Encounter (Addendum)
Clearance for [employer] exercise program complete by [name]. Pt notified form is at the front desk ready for pick up

## 2016-09-01 ENCOUNTER — Encounter: Payer: Self-pay | Admitting: Internal Medicine

## 2016-09-02 DIAGNOSIS — G4752 REM sleep behavior disorder: Secondary | ICD-10-CM | POA: Diagnosis not present

## 2016-09-02 DIAGNOSIS — G20 Parkinson's disease: Secondary | ICD-10-CM | POA: Diagnosis not present

## 2016-09-09 ENCOUNTER — Encounter: Payer: Self-pay | Admitting: Neurology

## 2016-09-12 DIAGNOSIS — M9903 Segmental and somatic dysfunction of lumbar region: Secondary | ICD-10-CM | POA: Diagnosis not present

## 2016-09-12 DIAGNOSIS — M5431 Sciatica, right side: Secondary | ICD-10-CM | POA: Diagnosis not present

## 2016-09-12 DIAGNOSIS — M9902 Segmental and somatic dysfunction of thoracic region: Secondary | ICD-10-CM | POA: Diagnosis not present

## 2016-09-12 DIAGNOSIS — M5136 Other intervertebral disc degeneration, lumbar region: Secondary | ICD-10-CM | POA: Diagnosis not present

## 2016-09-16 DIAGNOSIS — M9902 Segmental and somatic dysfunction of thoracic region: Secondary | ICD-10-CM | POA: Diagnosis not present

## 2016-09-16 DIAGNOSIS — M9903 Segmental and somatic dysfunction of lumbar region: Secondary | ICD-10-CM | POA: Diagnosis not present

## 2016-09-16 DIAGNOSIS — M5136 Other intervertebral disc degeneration, lumbar region: Secondary | ICD-10-CM | POA: Diagnosis not present

## 2016-09-16 DIAGNOSIS — M5431 Sciatica, right side: Secondary | ICD-10-CM | POA: Diagnosis not present

## 2016-09-17 ENCOUNTER — Ambulatory Visit (INDEPENDENT_AMBULATORY_CARE_PROVIDER_SITE_OTHER): Payer: Medicare Other | Admitting: Urology

## 2016-09-17 ENCOUNTER — Encounter: Payer: Self-pay | Admitting: Urology

## 2016-09-17 VITALS — BP 121/68 | HR 61 | Ht 67.0 in | Wt 196.3 lb

## 2016-09-17 DIAGNOSIS — N475 Adhesions of prepuce and glans penis: Secondary | ICD-10-CM

## 2016-09-17 NOTE — Progress Notes (Signed)
Error

## 2016-09-17 NOTE — Progress Notes (Signed)
09/17/2016 1:57 PM   Johnathan Arnold 1945/08/06 [id number]  Referring provider: [name], MD [place], [place] [address]  Chief Complaint  Patient presents with  . Follow-up    LICHEN SCLEROSUS, penile lesion    HPI: Patient was voiding last night and pulled the penile skin and felt pain like the "tore" something. Now the area of the frenulum stings when urine hits it. No other aggravating factors or alleviating symptoms. No associated signs or symptoms.   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 Intraoperatively he was noted to have meatal stenosis consistent with BXO and required dilation. Pathology showed evidence of lichen sclerosis. He reports a good stream now and is using clobestol. He continues to use his medication once per week. He denies any dysuria or hematuria.   2. BPH He reports nocturia 1. . He is on Flomax.  IPSS score was 2 on Sep 2017 with a normal DRE and PSA of 1.2. His frequency along with after mentions nocturia 1. He denies any other urinary symptoms.    PMH: Past Medical History:  Diagnosis Date  . 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 Diabetes mellitus without complication ([place])   . History of cardiac catheterization 2011   [facility]  . Hyperlipidemia   . Hypertension   . Hypertriglyceridemia   . 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 Vertigo     Surgical History: Past Surgical History:  Procedure Laterality Date  . CARDIAC CATHETERIZATION  05-19-2010   [facility]: Patent grafts. LIMA to LAD, SVG to D1, OM1 and RPDA  . CORONARY ARTERY BYPASS GRAFT  03/1998   5 vessel, [facility]    Home Medications:  Allergies as of 09/17/2016   No Known Allergies     Medication List       Accurate as of 09/17/16  1:57 PM. Always use your most recent med list.          aspirin 81 MG tablet Take by  mouth. Takes 2 tablets daily.   carbidopa-levodopa 25-100 MG tablet Commonly known as:  SINEMET IR Take 1 tablet by mouth 3 (three) times daily.   carvedilol 6.25 MG tablet Commonly known as:  COREG TAKE 1 TABLET BY MOUTH TWICE DAILY   clobetasol cream 0.05 % Commonly known as:  TEMOVATE Apply 1 application topically 2 (two) times daily. Apply medication once a week   diazepam 5 MG tablet Commonly known as:  VALIUM Take 5 mg by mouth as needed.   Fish Oil 1200 MG Caps Take by mouth daily.   hydrochlorothiazide 25 MG tablet Commonly known as:  HYDRODIURIL   isosorbide mononitrate 60 MG 24 hr tablet Commonly known as:  IMDUR TAKE 1 TABLET BY MOUTH EVERY DAY. ALSO TAKE 1 30 MG TABLET EVERY DAY   losartan 25 MG tablet Commonly known as:  COZAAR TAKE 1 TABLET BY MOUTH EVERY DAY   multivitamin tablet Take 1 tablet by mouth daily.   nitroGLYCERIN 0.4 MG SL tablet Commonly known as:  NITROSTAT Place under the tongue.   omeprazole 20 MG capsule Commonly known as:  PRILOSEC TAKE 1 CAPSULE BY MOUTH EVERY MORNING AS DIRECTED   polyethylene glycol packet Commonly known as:  MIRALAX / GLYCOLAX Take by mouth.   ranolazine 1000 MG SR tablet Commonly known as:  RANEXA TAKE 1 TABLET(1000  MG) BY MOUTH TWICE DAILY   simvastatin 20 MG tablet Commonly known as:  ZOCOR TAKE 1 TABLET BY MOUTH EVERY NIGHT AT BEDTIME   tamsulosin 0.4 MG Caps capsule Commonly known as:  FLOMAX Take 1 capsule (0.4 mg total) by mouth daily.   zolpidem 10 MG tablet Commonly known as:  AMBIEN TAKE 1 TABLET BY MOUTH EVERY NIGHT AT BEDTIME AS NEEDED FOR SLEEP       Allergies: No Known Allergies  Family History: Family History  Problem Relation Age of Onset  . Heart attack Mother 4  . Hypertension Mother   . Heart attack Father 61  . Heart disease Father   . Heart disease Brother     Social History:  reports that he has never smoked. He has never used smokeless tobacco. He reports that he  does not drink alcohol or use drugs.  ROS: UROLOGY Frequent Urination?: No Hard to postpone urination?: No Burning/pain with urination?: No Get up at night to urinate?: No Leakage of urine?: No Urine stream starts and stops?: No Trouble starting stream?: No Do you have to strain to urinate?: No Blood in urine?: No Urinary tract infection?: No Sexually transmitted disease?: No Injury to kidneys or bladder?: No Painful intercourse?: No Weak stream?: No Erection problems?: No Penile pain?: No  Gastrointestinal Nausea?: No Vomiting?: No Indigestion/heartburn?: No Diarrhea?: No Constipation?: Yes  Constitutional Fever: No Night sweats?: No Weight loss?: No Fatigue?: No  Skin Skin rash/lesions?: No Itching?: No  Eyes Blurred vision?: No Double vision?: No  Ears/Nose/Throat Sore throat?: No Sinus problems?: No  Hematologic/Lymphatic Swollen glands?: No Easy bruising?: No  Cardiovascular Leg swelling?: No Chest pain?: No  Respiratory Cough?: No Shortness of breath?: No  Endocrine Excessive thirst?: No  Musculoskeletal Back pain?: No Joint pain?: No  Neurological Headaches?: No Dizziness?: No  Psychologic Depression?: No Anxiety?: No  Physical Exam: BP 121/68   Pulse 61   Ht 5\' 7"  (1.702 m)   Wt 89 kg (196 lb 4.8 oz)   BMI 30.74 kg/m   Constitutional:  Alert and oriented, No acute distress. HEENT: [place] AT, moist mucus membranes.  Trachea midline, no masses. Cardiovascular: No clubbing, cyanosis, or edema. Respiratory: Normal respiratory effort, no increased work of breathing. GI: Abdomen is soft, nontender, nondistended, no abdominal masses GU: No CVA tenderness.  Penis - pt has mild adhesions of the penile/foreskin to the edge of the glans penis circumferentially and he had released an area at the frenulum. There was a small raw area/abrasion but no mass or worrisome lesion.  Skin: No rashes, bruises or suspicious lesions. Lymph: No  cervical or inguinal adenopathy. Neurologic: Grossly intact, no focal deficits, moving all 4 extremities. Psychiatric: Normal mood and affect.  Laboratory Data: Lab Results  Component Value Date   WBC 4.5 08/06/2016   HGB 12.4 (L) 08/06/2016   HCT 36.2 (L) 08/06/2016   MCV 99.8 08/06/2016   PLT 166.0 08/06/2016    Lab Results  Component Value Date   CREATININE 1.75 (H) 08/06/2016    Lab Results  Component Value Date   PSA 1.08 08/01/2014   PSA 1.00 07/07/2013   PSA 1.42 06/23/2012    Lab Results  Component Value Date   TESTOSTERONE 281 (L) 09/21/2012    Lab Results  Component Value Date   HGBA1C 5.6 08/06/2016    Urinalysis    Component Value Date/Time   COLORURINE YELLOW (A) 10/11/2014 2330   APPEARANCEUR Clear 02/27/2015 1020   LABSPEC 1.012 10/11/2014  2330   LABSPEC 1.009 03/15/2014 1005   PHURINE 5.0 10/11/2014 2330   GLUCOSEU Negative 02/27/2015 1020   GLUCOSEU Negative 03/15/2014 1005   HGBUR NEGATIVE 10/11/2014 2330   BILIRUBINUR Negative 02/27/2015 1020   BILIRUBINUR Negative 03/15/2014 1005   KETONESUR TRACE (A) 10/11/2014 2330   PROTEINUR Negative 02/27/2015 1020   PROTEINUR NEGATIVE 10/11/2014 2330   UROBILINOGEN 1.0 03/16/2012 1630   NITRITE Negative 02/27/2015 1020   NITRITE NEGATIVE 10/11/2014 2330   LEUKOCYTESUR Negative 02/27/2015 1020   LEUKOCYTESUR Negative 03/15/2014 1005     Assessment & Plan:   Penile adhesions - pt released some of the ventral adhesion. He was reassured. He should apply vaseline ointment to the area for a few days and it will heal. He will f/u for yearly Sep 2018.   There are no diagnoses linked to this encounter.  No Follow-up on file.  Festus Aloe, [facility] [address], [place], [place] [address] [phone]

## 2016-09-23 ENCOUNTER — Other Ambulatory Visit: Payer: Self-pay | Admitting: Internal Medicine

## 2016-09-29 DIAGNOSIS — L821 Other seborrheic keratosis: Secondary | ICD-10-CM | POA: Diagnosis not present

## 2016-09-29 DIAGNOSIS — D225 Melanocytic nevi of trunk: Secondary | ICD-10-CM | POA: Diagnosis not present

## 2016-09-29 DIAGNOSIS — L570 Actinic keratosis: Secondary | ICD-10-CM | POA: Diagnosis not present

## 2016-09-29 DIAGNOSIS — X32XXXA Exposure to sunlight, initial encounter: Secondary | ICD-10-CM | POA: Diagnosis not present

## 2016-10-01 NOTE — Progress Notes (Signed)
Johnathan Arnold. was seen today in the movement disorders clinic for neurologic consultation at the request of TULLO, TERESA L, MD.  This patient is accompanied in the office by his wife who supplements the history.  The consultation is for the evaluation of PD.  Pt currently under the care of Dr Manuella Ghazi and I have reviewed his records.  Began to see Dr. Manuella Ghazi in June, 2017.  His first symptom was right arm tremor.  By the time he began to see Dr. Manuella Ghazi in June, 2017 he had tremor in both hands.  He was started on carbidopa/levodopa 25/100, one tablet 3 times per day in June, 2017. Pt states that it did help tremor. He last saw Dr. Brigitte Pulse on 09/02/2016.  His levodopa was increased to 1-1/2 tablets 3 times per day (6-8am/1-2pm/6-7pm).  He cannot tell when the med wears off.   Specific Symptoms:  Tremor: Yes.  , mostly R hand at rest.   Family hx of similar:  No. (grandfather with tremor) Voice: maybe slight decrease Sleep: sleeps well  Vivid Dreams:  Yes.    Acting out dreams:  Yes.   (screams, has fallen out of bed) Wet Pillows: just a little Postural symptoms:  No.  Falls?  No. Bradykinesia symptoms: slight drag of the R leg Loss of smell: has never had great sense of smell Loss of taste:  No. Urinary Incontinence:  No. Difficulty Swallowing:  No. Handwriting, micrographia: Yes.   Trouble with ADL's:  No. (does brace self when putting on pants)  Trouble buttoning clothing: No. Depression:  No. Memory changes:  Some naming trouble (people) Hallucinations:  No.  visual distortions: No. N/V:  No. Lightheaded:  No. 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 Diplopia:  No. Dyskinesia:  No.  Patient had an MRI of the brain on 01/15/2015.  I had the opportunity to review this.  There was very mild small vessel disease.  Enhancing 11 x 13 mm cross-section soft tissue abnormality in the  RIGHT lateral mastoid. Cholesteatoma not excluded.   PREVIOUS MEDICATIONS:  Sinemet  ALLERGIES:  No Known Allergies  CURRENT MEDICATIONS:  Outpatient Encounter Prescriptions as of 10/03/2016  Medication Sig  . aspirin 81 MG tablet Take by mouth. Takes 2 tablets daily.  . carbidopa-levodopa (SINEMET IR) 25-100 MG tablet Take 1.5 tablets by mouth 3 (three) times daily.   . carvedilol (COREG) 6.25 MG tablet TAKE 1 TABLET BY MOUTH TWICE DAILY  . clobetasol cream (TEMOVATE) 8.18 % Apply 1 application topically 2 (two) times daily. Apply medication once a week  . diazepam (VALIUM) 5 MG tablet Take 5 mg by mouth as needed.   . hydrochlorothiazide (HYDRODIURIL) 25 MG tablet Take 25 mg by mouth daily.   . isosorbide mononitrate (IMDUR) 60 MG 24 hr tablet TAKE 1 TABLET BY MOUTH EVERY DAY. ALSO TAKE 1 30 MG TABLET EVERY DAY (Patient taking differently: 1 daily)  . losartan (COZAAR) 25 MG tablet TAKE 1 TABLET BY MOUTH EVERY DAY  . Multiple Vitamin (MULTIVITAMIN) tablet Take 1 tablet by mouth daily.    . nitroGLYCERIN (NITROSTAT) 0.4 MG SL tablet Place under the tongue.  . Omega-3 Fatty Acids (FISH OIL) 1200 MG CAPS Take by mouth daily.  Marland Kitchen omeprazole (PRILOSEC) 20 MG capsule TAKE 1 CAPSULE BY MOUTH EVERY MORNING AS DIRECTED  . polyethylene glycol (MIRALAX / GLYCOLAX) packet Take by mouth.  . ranolazine (RANEXA) 1000 MG SR tablet TAKE 1 TABLET(1000 MG)  BY MOUTH TWICE DAILY  . simvastatin (ZOCOR) 20 MG tablet TAKE 1 TABLET BY MOUTH EVERY NIGHT AT BEDTIME  . tamsulosin (FLOMAX) 0.4 MG CAPS capsule Take 1 capsule (0.4 mg total) by mouth daily.  Marland Kitchen zolpidem (AMBIEN) 10 MG tablet TAKE 1 TABLET BY MOUTH EVERY NIGHT AT BEDTIME AS NEEDED FOR SLEEP   No facility-administered encounter medications on file as of 10/03/2016.     PAST MEDICAL HISTORY:   Past Medical History:  Diagnosis Date  . 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 Diabetes mellitus without complication ([place])   . History of cardiac catheterization 2011   [facility]  . Hyperlipidemia   . Hypertension   .  Hypertriglyceridemia   . Parkinson's disease ([place])   . 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 Vertigo     PAST SURGICAL HISTORY:   Past Surgical History:  Procedure Laterality Date  . CARDIAC CATHETERIZATION  05-19-2010   [facility]: Patent grafts. LIMA to LAD, SVG to D1, OM1 and RPDA  . CORONARY ARTERY BYPASS GRAFT  03/1998   5 vessel, [place]    SOCIAL HISTORY:   Social History   Social History  . Marital status: Married    Spouse name: N/A  . Number of children: N/A  . Years of education: N/A   Occupational History  . retired     IT work   Social History Main Topics  . Smoking status: Never Smoker  . Smokeless tobacco: Never Used  . Alcohol use Yes     Comment: occasional beer  . Drug use: No  . Sexual activity: Not Currently   Other Topics Concern  . Not on file   Social History Narrative  . No narrative on file    FAMILY HISTORY:   Family Status  Relation Status  . Mother Deceased  . Father Deceased  . Brother Alive  . Son Alive    ROS:  c/o low stamina.  Admits to back pain.  fx vertebra years ago.  A complete 10 system review of systems was obtained and was unremarkable apart from what is mentioned above.  PHYSICAL EXAMINATION:    VITALS:   Vitals:   10/03/16 0932  BP: 90/62  Pulse: (!) 55  SpO2: 98%  Weight: 192 lb (87.1 kg)  Height: 5\' 7"  (1.702 m)    GEN:  The patient appears stated age and is in NAD. HEENT:  Normocephalic, atraumatic.  The mucous membranes are moist. The superficial temporal arteries are without ropiness or tenderness. CV:  Bradycardic.  Regular rhythm. Lungs:  CTAB Neck/HEME:  There are no carotid bruits bilaterally.  Neurological examination:  Orientation: The patient is alert and oriented x3. Fund of knowledge is appropriate.  Recent and remote memory are intact.  Attention and concentration are normal.    Able to name objects and repeat phrases. Cranial nerves: There is good facial symmetry. Pupils are equal round and reactive  to light bilaterally. Fundoscopic exam reveals clear margins bilaterally. Extraocular muscles are intact. The visual fields are full to confrontational testing. The speech is fluent and clear. Soft palate rises symmetrically and there is no tongue deviation. Hearing is intact to conversational tone. Sensation: Sensation is intact to light and pinprick throughout (facial, trunk, extremities). Vibration is intact at the bilateral big toe. There is no extinction with double simultaneous stimulation. There is no sensory dermatomal level identified. Motor: Strength is 5/5 in the bilateral upper and lower extremities.  Shoulder shrug is equal and symmetric.  There is no pronator drift. Deep tendon reflexes: Deep tendon reflexes are 2/4 at the bilateral biceps, triceps, brachioradialis, 3/4 at the bilateral patella and achilles. There is nonsustained ankle clonus on the right. Plantar responses are downgoing bilaterally.  Movement examination: Tone: There is normal tone in the bilateral upper extremities.  The tone in the lower extremities is normal.  Abnormal movements: There is RUE resting tremor.  It does not increase with distraction Coordination:  There is no decremation with RAM's, with any form of RAMS, including alternating supination and pronation of the forearm, hand opening and closing, finger taps, heel taps and toe taps. Gait and Station: The patient has no difficulty arising out of a deep-seated chair without the use of the hands. The patient's stride length is normal with somewhat purposeful arm swing.  Able to ambulate in a heel toe fashion and in a tandem fashion.  The patient has a negative pull test.      ASSESSMENT/PLAN:  1.  Idiopathic Parkinson's disease, tremor predominant.  Dx: 11/2015  -We discussed the diagnosis as well as pathophysiology of the disease.  We discussed treatment options as well as prognostic indicators.  Patient education was provided.  -We discussed that it used to  be thought that levodopa would increase risk of melanoma but now it is believed that Parkinsons itself likely increases risk of melanoma. he is to get regular skin checks.  -Greater than 50% of the 60 minute visit was spent in counseling answering questions and talking about what to expect now as well as in the future.  We talked about medication options as well as potential future surgical options.  We talked about safety in the home.  -I did tell him that he did not meet criteria for PD today but it was likely because his med was just covering up sx's.  I will have him hold his carbidopa/levodopa before next visit.  He will continue carbidopa/levodopa 25/100, 1.5 tid.    -he is in rock steady boxing and in a heart program.  Congratulated him on all the exercise.    -We discussed community resources in the area including patient support groups and community exercise programs for PD and pt education was provided to the patient.  Invited to PD symposium.  2.  Hx of HTN  -PD is really one true cure for HTN.  On several BP lowering agents and BP low in office today.  If able, may want to consider lowering meds.  3.  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  Pt states known fx years ago.  Will monitor clinically  4.  Follow up is anticipated in the next few months, sooner should new neurologic issues arise.  Cc:  [name], MD

## 2016-10-02 DIAGNOSIS — R600 Localized edema: Secondary | ICD-10-CM | POA: Diagnosis not present

## 2016-10-02 DIAGNOSIS — N183 Chronic kidney disease, stage 3 (moderate): Secondary | ICD-10-CM | POA: Diagnosis not present

## 2016-10-02 DIAGNOSIS — I10 Essential (primary) hypertension: Secondary | ICD-10-CM | POA: Diagnosis not present

## 2016-10-02 LAB — BASIC METABOLIC PANEL
BUN: 21 mg/dL (ref 4–21)
Creatinine: 1.6 mg/dL — AB (ref 0.6–1.3)
Glucose: 129 mg/dL
Potassium: 4.9 mmol/L (ref 3.4–5.3)
Sodium: 140 mmol/L (ref 137–147)

## 2016-10-03 ENCOUNTER — Ambulatory Visit (INDEPENDENT_AMBULATORY_CARE_PROVIDER_SITE_OTHER): Payer: Medicare Other | Admitting: Neurology

## 2016-10-03 ENCOUNTER — Encounter: Payer: Self-pay | Admitting: Neurology

## 2016-10-03 VITALS — BP 90/62 | HR 55 | Ht 67.0 in | Wt 192.0 lb

## 2016-10-03 DIAGNOSIS — G20 Parkinson's disease: Secondary | ICD-10-CM | POA: Diagnosis not present

## 2016-10-03 DIAGNOSIS — I10 Essential (primary) hypertension: Secondary | ICD-10-CM

## 2016-10-03 NOTE — Patient Instructions (Signed)
1. Hold Levodopa for 24 hours before next visit.  2. Follow up in 4 months.

## 2016-10-05 ENCOUNTER — Other Ambulatory Visit: Payer: Self-pay | Admitting: Internal Medicine

## 2016-10-08 ENCOUNTER — Other Ambulatory Visit: Payer: Self-pay

## 2016-10-12 ENCOUNTER — Other Ambulatory Visit: Payer: Self-pay | Admitting: Cardiovascular Disease

## 2016-10-17 DIAGNOSIS — M9902 Segmental and somatic dysfunction of thoracic region: Secondary | ICD-10-CM | POA: Diagnosis not present

## 2016-10-17 DIAGNOSIS — M9903 Segmental and somatic dysfunction of lumbar region: Secondary | ICD-10-CM | POA: Diagnosis not present

## 2016-10-17 DIAGNOSIS — M5431 Sciatica, right side: Secondary | ICD-10-CM | POA: Diagnosis not present

## 2016-10-17 DIAGNOSIS — M5136 Other intervertebral disc degeneration, lumbar region: Secondary | ICD-10-CM | POA: Diagnosis not present

## 2016-10-26 ENCOUNTER — Other Ambulatory Visit: Payer: Self-pay | Admitting: Internal Medicine

## 2016-10-27 NOTE — Telephone Encounter (Signed)
If I have never filled it  I cannot without more information sinc it is a controlled substance

## 2016-10-27 NOTE — Telephone Encounter (Signed)
This is a historical medication for you, has been prescribed in the past by a [facility], last OV was 08/06/16, Please advise, thanks

## 2016-10-29 ENCOUNTER — Encounter: Payer: Self-pay | Admitting: Internal Medicine

## 2016-10-30 ENCOUNTER — Other Ambulatory Visit: Payer: Self-pay | Admitting: Internal Medicine

## 2016-10-31 ENCOUNTER — Other Ambulatory Visit: Payer: Self-pay | Admitting: Internal Medicine

## 2016-10-31 MED ORDER — DIAZEPAM 5 MG PO TABS: 5.0000 mg | ORAL_TABLET | Freq: Three times a day (TID) | ORAL | 5 refills | Status: DC | PRN

## 2016-10-31 MED ORDER — PREDNISONE 10 MG PO TABS: ORAL_TABLET | ORAL | 0 refills | Status: DC

## 2016-10-31 NOTE — Telephone Encounter (Signed)
This is the same drug from the [place] message, you have never filled for this patient, please advise, thanks

## 2016-10-31 NOTE — Telephone Encounter (Signed)
My chart message sent,  Diazepam refilled and faxed

## 2016-11-07 DIAGNOSIS — M5136 Other intervertebral disc degeneration, lumbar region: Secondary | ICD-10-CM | POA: Diagnosis not present

## 2016-11-07 DIAGNOSIS — M9902 Segmental and somatic dysfunction of thoracic region: Secondary | ICD-10-CM | POA: Diagnosis not present

## 2016-11-07 DIAGNOSIS — M9903 Segmental and somatic dysfunction of lumbar region: Secondary | ICD-10-CM | POA: Diagnosis not present

## 2016-11-07 DIAGNOSIS — M5431 Sciatica, right side: Secondary | ICD-10-CM | POA: Diagnosis not present

## 2016-11-25 DIAGNOSIS — H90A22 Sensorineural hearing loss, unilateral, left ear, with restricted hearing on the contralateral side: Secondary | ICD-10-CM | POA: Diagnosis not present

## 2016-11-25 DIAGNOSIS — H8109 Meniere's disease, unspecified ear: Secondary | ICD-10-CM | POA: Diagnosis not present

## 2016-11-25 DIAGNOSIS — H903 Sensorineural hearing loss, bilateral: Secondary | ICD-10-CM | POA: Diagnosis not present

## 2016-12-04 DIAGNOSIS — H8102 Meniere's disease, left ear: Secondary | ICD-10-CM | POA: Diagnosis not present

## 2016-12-04 DIAGNOSIS — H903 Sensorineural hearing loss, bilateral: Secondary | ICD-10-CM | POA: Diagnosis not present

## 2016-12-05 DIAGNOSIS — M9902 Segmental and somatic dysfunction of thoracic region: Secondary | ICD-10-CM | POA: Diagnosis not present

## 2016-12-05 DIAGNOSIS — M5431 Sciatica, right side: Secondary | ICD-10-CM | POA: Diagnosis not present

## 2016-12-05 DIAGNOSIS — M5136 Other intervertebral disc degeneration, lumbar region: Secondary | ICD-10-CM | POA: Diagnosis not present

## 2016-12-05 DIAGNOSIS — M9903 Segmental and somatic dysfunction of lumbar region: Secondary | ICD-10-CM | POA: Diagnosis not present

## 2017-01-10 ENCOUNTER — Other Ambulatory Visit: Payer: Self-pay | Admitting: Cardiovascular Disease

## 2017-01-12 DIAGNOSIS — H8102 Meniere's disease, left ear: Secondary | ICD-10-CM | POA: Diagnosis not present

## 2017-01-19 ENCOUNTER — Emergency Department
Admission: EM | Admit: 2017-01-19 | Discharge: 2017-01-19 | Disposition: A | Discharge status: Home / Self Care | Payer: Medicare Other | Attending: Emergency Medicine | Admitting: Emergency Medicine

## 2017-01-19 ENCOUNTER — Encounter: Payer: Self-pay | Admitting: Emergency Medicine

## 2017-01-19 ENCOUNTER — Emergency Department: Payer: Medicare Other

## 2017-01-19 DIAGNOSIS — R251 Tremor, unspecified: Secondary | ICD-10-CM | POA: Insufficient documentation

## 2017-01-19 DIAGNOSIS — Z951 Presence of aortocoronary bypass graft: Secondary | ICD-10-CM | POA: Diagnosis not present

## 2017-01-19 DIAGNOSIS — R110 Nausea: Secondary | ICD-10-CM | POA: Insufficient documentation

## 2017-01-19 DIAGNOSIS — Z79899 Other long term (current) drug therapy: Secondary | ICD-10-CM | POA: Insufficient documentation

## 2017-01-19 DIAGNOSIS — I10 Essential (primary) hypertension: Secondary | ICD-10-CM | POA: Insufficient documentation

## 2017-01-19 DIAGNOSIS — Z7982 Long term (current) use of aspirin: Secondary | ICD-10-CM | POA: Insufficient documentation

## 2017-01-19 DIAGNOSIS — R259 Unspecified abnormal involuntary movements: Secondary | ICD-10-CM | POA: Diagnosis not present

## 2017-01-19 DIAGNOSIS — E119 Type 2 diabetes mellitus without complications: Secondary | ICD-10-CM | POA: Diagnosis not present

## 2017-01-19 DIAGNOSIS — R531 Weakness: Secondary | ICD-10-CM | POA: Diagnosis not present

## 2017-01-19 DIAGNOSIS — R001 Bradycardia, unspecified: Secondary | ICD-10-CM | POA: Diagnosis not present

## 2017-01-19 LAB — CBC
HEMATOCRIT: 35.5 % — AB (ref 40.0–52.0)
HEMOGLOBIN: 12.4 g/dL — AB (ref 13.0–18.0)
MCH: 34 pg (ref 26.0–34.0)
MCHC: 34.8 g/dL (ref 32.0–36.0)
MCV: 97.6 fL (ref 80.0–100.0)
Platelets: 209 10*3/uL (ref 150–440)
RBC: 3.64 MIL/uL — AB (ref 4.40–5.90)
RDW: 13.7 % (ref 11.5–14.5)
WBC: 5.1 10*3/uL (ref 3.8–10.6)

## 2017-01-19 LAB — BASIC METABOLIC PANEL
ANION GAP: 9 (ref 5–15)
BUN: 13 mg/dL (ref 6–20)
CHLORIDE: 102 mmol/L (ref 101–111)
CO2: 26 mmol/L (ref 22–32)
Calcium: 9.3 mg/dL (ref 8.9–10.3)
Creatinine, Ser: 1.37 mg/dL — ABNORMAL HIGH (ref 0.61–1.24)
GFR calc non Af Amer: 50 mL/min — ABNORMAL LOW (ref >60)
GFR, EST AFRICAN AMERICAN: 58 mL/min — AB (ref >60)
GLUCOSE: 140 mg/dL — AB (ref 65–99)
POTASSIUM: 4.4 mmol/L (ref 3.5–5.1)
Sodium: 137 mmol/L (ref 135–145)

## 2017-01-19 LAB — URINALYSIS, COMPLETE (UACMP) WITH MICROSCOPIC
BACTERIA UA: NONE SEEN
BILIRUBIN URINE: NEGATIVE
Glucose, UA: NEGATIVE mg/dL
HGB URINE DIPSTICK: NEGATIVE
Ketones, ur: NEGATIVE mg/dL
Nitrite: NEGATIVE
PROTEIN: NEGATIVE mg/dL
SPECIFIC GRAVITY, URINE: 1.008 (ref 1.005–1.030)
Squamous Epithelial / LPF: NONE SEEN
pH: 6 (ref 5.0–8.0)

## 2017-01-19 LAB — GLUCOSE, CAPILLARY: GLUCOSE-CAPILLARY: 117 mg/dL — AB (ref 65–99)

## 2017-01-19 NOTE — ED Provider Notes (Signed)
[facility] Emergency Department Provider Note  Time seen: 6:38 PM  I have reviewed the triage vital signs and the nursing notes.   HISTORY  Chief Complaint Shaking    HPI Johnathan Arnold. is a 71 y.o. male With a past medical history of diabetes, hypertension, hyperlipidemia, Parkinson's, presents to the emergency department with shaking. According to the patient he had not been feeling well earlier today somewhat nauseated, states when he got homearound lunchtime he had diffuse shaking over the left side of his body with mild shaking over the right side of his body. States he took a Valium tablet which he is prescribed for vertigo. States he took a nap and was feeling much better upon awakening. Patient was concerned as to why he was having the shaking so he came to the emergency department for evaluation. Denies any fever, cough or dysuria. Patient states he does have a resting tremor with Parkinson's but it is not this severe as he was experiencing earlier. Denies any weakness or numbness, confusion or difficulty speaking or thinking.  Past Medical History:  Diagnosis Date  . 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 Diabetes mellitus without complication ([place])   . History of cardiac catheterization 2011   [facility]  . Hyperlipidemia   . Hypertension   . Hypertriglyceridemia   . Parkinson's disease ([place])   . 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 Vertigo     Patient Active Problem List   Diagnosis Date Noted  . History of skin cancer in adulthood 08/14/2016  . Parkinson's disease ([place]) 08/09/2016  . Resting tremor 08/07/2015  . Bilateral carotid artery stenosis 04/02/2015  . Vertigo, peripheral 10/17/2014  . Acute renal failure ([place]) 05/02/2014  . Benign prostatic hypertrophy with urinary frequency 01/31/2014  . Encounter for Medicare annual wellness exam 07/02/2013  . Change in bowel habits 07/02/2013  . Obesity 04/03/2013  . Other malaise and  fatigue 09/21/2012  . Hyperlipidemia   . Hypertension   . 3-vessel coronary artery disease   . S/P CABG x 5   . 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 CARDIAC CATHETERIZATION  05-19-2010   [facility]: Patent grafts. LIMA to LAD, SVG to D1, OM1 and RPDA  . CORONARY ARTERY BYPASS GRAFT  03/1998   5 vessel,     Prior to Admission medications   Medication Sig Start Date End Date Taking? Authorizing Provider  aspirin 81 MG tablet Take by mouth. Takes 2 tablets daily.    [provider]  carbidopa-levodopa (SINEMET IR) 25-100 MG tablet Take 1.5 tablets by mouth 3 (three) times daily.     [provider]  carvedilol (COREG) 6.25 MG tablet TAKE 1 TABLET BY MOUTH TWICE DAILY 02/19/16   Wellington Hampshire, MD  clobetasol cream (TEMOVATE) 0.10 % Apply 1 application topically 2 (two) times daily. Apply medication once a week 10/05/15   Nickie Retort, MD  diazepam (VALIUM) 5 MG tablet Take 1 tablet (5 mg total) by mouth every 8 (eight) hours as needed. For vertigo 10/31/16   [name], MD  hydrochlorothiazide (HYDRODIURIL) 25 MG tablet Take 25 mg by mouth daily.  08/28/16   [provider]  isosorbide mononitrate (IMDUR) 60 MG 24 hr tablet TAKE 1 TABLET BY MOUTH EVERY DAY.  ALSO TAKE 1 30 MG TABLET EVERY DAY Patient taking differently: 1 daily 10/29/15   Wellington Hampshire, MD  isosorbide mononitrate (IMDUR) 60 MG 24 hr tablet TAKE 1 TABLET BY MOUTH EVERY DAY. ALSO TAKE 1 30 MG TABLET EVERY DAY 10/13/16   Wellington Hampshire, MD  losartan (COZAAR) 25 MG tablet TAKE 1 TABLET BY MOUTH EVERY DAY 09/24/16   [name], MD  Multiple Vitamin (MULTIVITAMIN) tablet Take 1 tablet by mouth daily.      [provider]  nitroGLYCERIN (NITROSTAT) 0.4 MG SL tablet Place under the tongue.    [provider]  Omega-3 Fatty Acids (FISH OIL) 1200 MG CAPS Take by mouth daily.    [provider]  omeprazole (PRILOSEC) 20 MG capsule TAKE 1 CAPSULE BY MOUTH EVERY MORNING AS DIRECTED 10/05/15   [name], MD  omeprazole (PRILOSEC) 20 MG capsule TAKE 1 CAPSULE BY MOUTH EVERY MORNING AS DIRECTED 10/06/16   [name], MD  polyethylene glycol (MIRALAX / GLYCOLAX) packet Take by mouth.    [provider]  predniSONE (DELTASONE) 10 MG tablet 6 tablets on Day 1 , then reduce by 1 tablet daily until gone 10/31/16   [name], MD  ranolazine (RANEXA) 1000 MG SR tablet TAKE 1 TABLET(1000 MG) BY MOUTH TWICE DAILY 03/10/16   Wellington Hampshire, MD  simvastatin (ZOCOR) 20 MG tablet TAKE 1 TABLET BY MOUTH EVERY NIGHT AT BEDTIME 01/11/16   Wellington Hampshire, MD  simvastatin (ZOCOR) 20 MG tablet TAKE 1 TABLET BY MOUTH EVERY NIGHT AT BEDTIME 01/12/17   Wellington Hampshire, MD  tamsulosin (FLOMAX) 0.4 MG CAPS capsule Take 1 capsule (0.4 mg total) by mouth daily. 07/31/16   Nickie Retort, MD  zolpidem (AMBIEN) 10 MG tablet TAKE 1 TABLET BY MOUTH EVERY NIGHT AT BEDTIME AS NEEDED FOR SLEEP 01/07/16   [name], MD    No Known Allergies  Family History  Problem Relation Age of Onset  . Heart attack Mother 87  . Hypertension Mother   . Heart attack Father 50  . Heart disease Father   . Heart disease Brother     Social History Social History  Substance Use Topics  . Smoking status: Never Smoker  . Smokeless tobacco: Never Used  . Alcohol use Yes     Comment: occasional beer    Review of Systems Constitutional: Negative for fever Cardiovascular: Negative for chest pain. Respiratory: Negative for shortness of breath.negative for cough Gastrointestinal: Negative for abdominal pain, vomiting and diarrhea.positive for nausea earlier Genitourinary: Negative for dysuria. Neurological: Negative for headache All other ROS  negative  ____________________________________________   PHYSICAL EXAM:  VITAL SIGNS: ED Triage Vitals [01/19/17 1606]  Enc Vitals Group     BP (!) 170/68     Pulse Rate 61     Resp 18     Temp 98 F (36.7 C)     Temp Source Oral     SpO2 100 %     Weight 186 lb (84.4 kg)     Height 5\' 7"  (1.702 m)     Head Circumference      Peak Flow      Pain Score      Pain Loc      Pain Edu?      Excl. in [place]?     Constitutional: Alert and oriented. Well appearing and in no distress. Eyes: Normal exam ENT   Head: Normocephalic and atraumatic.  Mouth/Throat: Mucous membranes are moist. Cardiovascular: Normal rate, regular rhythm. No murmur Respiratory: Normal respiratory effort without tachypnea nor retractions. Breath sounds are clear  Gastrointestinal: Soft and nontender. No distention.  Musculoskeletal: Nontender with normal range of motion in all extremities. Neurologic:  Normal speech and language. No gross focal neurologic deficits. Patient does have a slight tremor in all extremities, but no weakness, 5/5 motor in all extremities. Sensation intact. No pronator drift. Equal grip strengths. No cranial nerve deficits. Skin:  Skin is warm, dry and intact.  Psychiatric: Mood and affect are normal.   ____________________________________________    EKG  EKG reviewed and interpreted by myself shows sinus bradycardia at 59 bpm, narrow QRS, normal axis, normal intervals, nonspecific ST changes, without elevation.  CT scan of the head is negative. Chest x-ray is negative ____________________________________________   INITIAL IMPRESSION / ASSESSMENT AND PLAN / ED COURSE  Pertinent labs & imaging results that were available during my care of the patient were reviewed by me and considered in my medical decision making (see chart for details).  patient presents to the emergency department for generalized shaking left side greater than right side earlier today. Patient does  have a history of Parkinson's but states the shaking seemed more severe. Patient has a normal neurological exam but he does have a mild tremor currently but he states this is fairly typical for him. Denies any dysuria, cough or congestion. Denies any known fever. Patient's initial labs are largely within normal limits. We will check a urinalyray as well as a CT scan of the head to further evaluate. Patient is agreeable to this plan.  CT and chest x-ray are negative. Labs are largely at baseline Urinalysis is normal. At this time it is not entirely clear what caused the patient's increased shaking. This could be Parkinson's or medication related. I do not believe it to be infectious. Patient has a great neurological exam currently do not believe this to be concerning for CVA/TIA. As it involved both sides of the body and the patient remained conscious throughout do not believe it to be seizure related. Patient is feeling much better at this time. We will discharge home with PCP follow-up. I discussed return precautions with the patient. ____________________________________________   FINAL CLINICAL IMPRESSION(S) / ED DIAGNOSES  shaking    Harvest Dark, MD 01/19/17 1949

## 2017-01-19 NOTE — ED Notes (Addendum)
Patient transported to CT 

## 2017-01-19 NOTE — ED Triage Notes (Addendum)
Pt reports an episode approximately three hours ago where he was shaking. Pt reports he was aware of what was going on while it was happening. Pt reports he took a valium that he has prescribed for vertigo. Pt denies pain. Pt reports eating today. Pt alert and oriented in triage, bilateral strong hand grips, no drift noted and facial symmetry intact. Pt reports feeling weak and woozy.

## 2017-01-19 NOTE — ED Notes (Signed)
POCT Blood Glucose  = 117

## 2017-01-26 DIAGNOSIS — G4752 REM sleep behavior disorder: Secondary | ICD-10-CM | POA: Diagnosis not present

## 2017-01-26 DIAGNOSIS — G20 Parkinson's disease: Secondary | ICD-10-CM | POA: Diagnosis not present

## 2017-01-26 DIAGNOSIS — G2581 Restless legs syndrome: Secondary | ICD-10-CM | POA: Diagnosis not present

## 2017-01-26 DIAGNOSIS — G3184 Mild cognitive impairment, so stated: Secondary | ICD-10-CM | POA: Diagnosis not present

## 2017-01-30 DIAGNOSIS — M9902 Segmental and somatic dysfunction of thoracic region: Secondary | ICD-10-CM | POA: Diagnosis not present

## 2017-01-30 DIAGNOSIS — M5136 Other intervertebral disc degeneration, lumbar region: Secondary | ICD-10-CM | POA: Diagnosis not present

## 2017-01-30 DIAGNOSIS — M9903 Segmental and somatic dysfunction of lumbar region: Secondary | ICD-10-CM | POA: Diagnosis not present

## 2017-01-30 DIAGNOSIS — M5431 Sciatica, right side: Secondary | ICD-10-CM | POA: Diagnosis not present

## 2017-02-04 ENCOUNTER — Ambulatory Visit (INDEPENDENT_AMBULATORY_CARE_PROVIDER_SITE_OTHER): Payer: Medicare Other | Admitting: Internal Medicine

## 2017-02-04 ENCOUNTER — Encounter: Payer: Self-pay | Admitting: Internal Medicine

## 2017-02-04 VITALS — BP 116/58 | HR 55 | Temp 97.9°F | Resp 16 | Ht 67.0 in | Wt 188.2 lb

## 2017-02-04 DIAGNOSIS — M79662 Pain in left lower leg: Secondary | ICD-10-CM | POA: Diagnosis not present

## 2017-02-04 DIAGNOSIS — G20 Parkinson's disease: Secondary | ICD-10-CM

## 2017-02-04 DIAGNOSIS — M79661 Pain in right lower leg: Secondary | ICD-10-CM | POA: Diagnosis not present

## 2017-02-04 DIAGNOSIS — E1121 Type 2 diabetes mellitus with diabetic nephropathy: Secondary | ICD-10-CM

## 2017-02-04 DIAGNOSIS — I2510 Atherosclerotic heart disease of native coronary artery without angina pectoris: Secondary | ICD-10-CM | POA: Diagnosis not present

## 2017-02-04 DIAGNOSIS — I10 Essential (primary) hypertension: Secondary | ICD-10-CM | POA: Diagnosis not present

## 2017-02-04 LAB — COMPREHENSIVE METABOLIC PANEL
ALBUMIN: 4.2 g/dL (ref 3.5–5.2)
ALK PHOS: 46 U/L (ref 39–117)
ALT: 7 U/L (ref 0–53)
AST: 19 U/L (ref 0–37)
BILIRUBIN TOTAL: 0.7 mg/dL (ref 0.2–1.2)
BUN: 15 mg/dL (ref 6–23)
CO2: 30 mEq/L (ref 19–32)
Calcium: 9.5 mg/dL (ref 8.4–10.5)
Chloride: 103 mEq/L (ref 96–112)
Creatinine, Ser: 1.57 mg/dL — ABNORMAL HIGH (ref 0.40–1.50)
GFR: 46.38 mL/min — ABNORMAL LOW (ref >60.00)
Glucose, Bld: 133 mg/dL — ABNORMAL HIGH (ref 70–99)
POTASSIUM: 5.3 meq/L — AB (ref 3.5–5.1)
SODIUM: 138 meq/L (ref 135–145)
TOTAL PROTEIN: 6.5 g/dL (ref 6.0–8.3)

## 2017-02-04 LAB — HEMOGLOBIN A1C: HEMOGLOBIN A1C: 6.7 % — AB (ref 4.6–6.5)

## 2017-02-04 LAB — LIPID PANEL
CHOLESTEROL: 105 mg/dL (ref 0–200)
HDL: 42.7 mg/dL (ref >39.00)
LDL Cholesterol: 41 mg/dL (ref 0–99)
NonHDL: 62.08
Total CHOL/HDL Ratio: 2
Triglycerides: 104 mg/dL (ref 0.0–149.0)
VLDL: 20.8 mg/dL (ref 0.0–40.0)

## 2017-02-04 MED ORDER — BENZONATATE 200 MG PO CAPS: 200.0000 mg | ORAL_CAPSULE | Freq: Three times a day (TID) | ORAL | 1 refills | Status: DC | PRN

## 2017-02-04 NOTE — Progress Notes (Signed)
Subjective:  Patient ID: Corgan Mormile., male    DOB: Jan 20, 1946  Age: 71 y.o. MRN: [id number]  CC: The primary encounter diagnosis was Bilateral calf pain. 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  HPI  Menelik Mcfarren. presents for 6 month follow up on type 2 DM  Evaluated in ER on August 13 for a self report episode of generalized shaking involving both sides of body, L > R, which had resolved prior to evaluation . Occurred  while conscious.   States it occurred while getting out of vehicle,  Came home early from an outing because he thought he was getting  Vertigo (fuzzy headed and nausea ). Has a parkinson's tremor but felt this was different.  . Had  CT head, chest x ray and labs negative for acute changes and infection.     Has not checked sugar during prior episodes, but in ER glucose was normal.  Had a milder episode this morning.  Has not seen neurology yet.  Description  is more of a coarse tremor,  Not a ballistic tremor  Having" restless legs", he thinks.  Calf muscles are sore all the time .  Calf muscles burn when walking on an incline. Having trouble participating in the "[facility]" program   Has been congested and coughing since Saturday,  sinus drainage is yellowish /green in the am but is clear the rest of the day,  No facial pain, just mild pressure.   Lab Results  Component Value Date   HGBA1C 6.7 (H) 02/04/2017    Outpatient Medications Prior to Visit  Medication Sig Dispense Refill  . aspirin 81 MG tablet Take by mouth. Takes 2 tablets daily.    . carbidopa-levodopa (SINEMET IR) 25-100 MG tablet Take 1 tablet by mouth 3 (three) times daily.     . carvedilol (COREG) 6.25 MG tablet TAKE 1 TABLET BY MOUTH TWICE DAILY 180 tablet 3  . clobetasol cream (TEMOVATE) 4.09 % Apply 1 application  topically 2 (two) times daily. Apply medication once a week 30 g 2  . diazepam (VALIUM) 5 MG tablet Take 1 tablet (5 mg total) by mouth every 8 (eight) hours as needed. For vertigo 30 tablet 5  . hydrochlorothiazide (HYDRODIURIL) 25 MG tablet Take 25 mg by mouth daily.     . isosorbide mononitrate (IMDUR) 60 MG 24 hr tablet TAKE 1 TABLET BY MOUTH EVERY DAY. ALSO TAKE 1 30 MG TABLET EVERY DAY (Patient taking differently: 1 daily) 90 tablet 3  . losartan (COZAAR) 25 MG tablet TAKE 1 TABLET BY MOUTH EVERY DAY 90 tablet 1  . Multiple Vitamin (MULTIVITAMIN) tablet Take 1 tablet by mouth daily.      . nitroGLYCERIN (NITROSTAT) 0.4 MG SL tablet Place under the tongue.    . Omega-3 Fatty Acids (FISH OIL) 1200 MG CAPS Take by mouth daily.    Marland Kitchen omeprazole (PRILOSEC) 20 MG capsule TAKE 1 CAPSULE BY MOUTH EVERY MORNING AS DIRECTED 90 capsule 0  . polyethylene glycol (MIRALAX / GLYCOLAX) packet Take by mouth.    . ranolazine (RANEXA) 1000 MG SR tablet TAKE 1 TABLET(1000 MG) BY MOUTH TWICE DAILY 60 tablet 11  . simvastatin (ZOCOR) 20 MG tablet TAKE 1 TABLET BY MOUTH EVERY NIGHT AT BEDTIME 90 tablet 3  . tamsulosin (FLOMAX) 0.4 MG CAPS capsule Take 1 capsule (0.4 mg total)  by mouth daily. 30 capsule 6  . zolpidem (AMBIEN) 10 MG tablet TAKE 1 TABLET BY MOUTH EVERY NIGHT AT BEDTIME AS NEEDED FOR SLEEP 30 tablet 0  . isosorbide mononitrate (IMDUR) 60 MG 24 hr tablet TAKE 1 TABLET BY MOUTH EVERY DAY. ALSO TAKE 1 30 MG TABLET EVERY DAY (Patient not taking: Reported on 02/04/2017) 90 tablet 1  . omeprazole (PRILOSEC) 20 MG capsule TAKE 1 CAPSULE BY MOUTH EVERY MORNING AS DIRECTED (Patient not taking: Reported on 02/04/2017) 90 capsule 0  . predniSONE (DELTASONE) 10 MG tablet 6 tablets on Day 1 , then reduce by 1 tablet daily until gone (Patient not taking: Reported on 02/04/2017) 21 tablet 0  . simvastatin (ZOCOR) 20 MG tablet TAKE 1 TABLET BY MOUTH EVERY NIGHT AT BEDTIME (Patient not taking: Reported on 02/04/2017) 90  tablet 3   No facility-administered medications prior to visit.     Review of Systems;  Patient denies headache, fevers, malaise, unintentional weight loss, skin rash, eye pain, sinus congestion and sinus pain, sore throat, dysphagia,  hemoptysis , cough, dyspnea, wheezing, chest pain, palpitations, orthopnea, edema, abdominal pain, nausea, melena, diarrhea, constipation, flank pain, dysuria, hematuria, urinary  Frequency, nocturia, numbness, tingling, seizures,  Focal weakness, Loss of consciousness,  Tremor, insomnia, depression, anxiety, and suicidal ideation.      Objective:  BP (!) 116/58 (BP Location: Left Arm, Patient Position: Sitting, Cuff Size: Normal)   Pulse (!) 55   Temp 97.9 F (36.6 C) (Oral)   Resp 16   Ht 5\' 7"  (1.702 m)   Wt 188 lb 3.2 oz (85.4 kg)   SpO2 98%   BMI 29.48 kg/m   BP Readings from Last 3 Encounters:  02/04/17 (!) 116/58  01/19/17 (!) 143/57  10/03/16 90/62    Wt Readings from Last 3 Encounters:  02/04/17 188 lb 3.2 oz (85.4 kg)  01/19/17 186 lb (84.4 kg)  10/03/16 192 lb (87.1 kg)    General appearance: alert, cooperative and appears stated age Ears: normal TM's and external ear canals both ears Throat: lips, mucosa, and tongue normal; teeth and gums normal Neck: no adenopathy, no carotid bruit, supple, symmetrical, trachea midline and thyroid not enlarged, symmetric, no tenderness/mass/nodules Back: symmetric, no curvature. ROM normal. No CVA tenderness. Lungs: clear to auscultation bilaterally Heart: regular rate and rhythm, S1, S2 normal, no murmur, click, rub or gallop Abdomen: soft, non-tender; bowel sounds normal; no masses,  no organomegaly Pulses: 2+ and symmetric Skin: Skin color, texture, turgor normal. No rashes or lesions Lymph nodes: Cervical, supraclavicular, and axillary nodes normal. Neuro:  awake and interactive with normal mood and affect. Higher cortical functions are normal. Speech is clear without word-finding  difficulty or dysarthria. Extraocular movements are intact. Visual fields of both eyes are grossly intact. Sensation to light touch is grossly intact bilaterally of upper and lower extremities. Motor examination shows 4+/5 symmetric hand grip and upper extremity and 5/5 lower extremity strength. There is no pronation or drift. Gait is non-ataxic Tremor is resting,  And fine     Lab Results  Component Value Date   HGBA1C 6.7 (H) 02/04/2017   HGBA1C 5.6 08/06/2016   HGBA1C 5.8 03/12/2016    Lab Results  Component Value Date   CREATININE 1.57 (H) 02/04/2017   CREATININE 1.37 (H) 01/19/2017   CREATININE 1.6 (A) 10/02/2016    Lab Results  Component Value Date   WBC 5.1 01/19/2017   HGB 12.4 (L) 01/19/2017   HCT 35.5 (L) 01/19/2017  PLT 209 01/19/2017   GLUCOSE 133 (H) 02/04/2017   CHOL 105 02/04/2017   TRIG 104.0 02/04/2017   HDL 42.70 02/04/2017   LDLDIRECT 38.0 08/06/2015   LDLCALC 41 02/04/2017   ALT 7 02/04/2017   AST 19 02/04/2017   NA 138 02/04/2017   K 5.3 (H) 02/04/2017   CL 103 02/04/2017   CREATININE 1.57 (H) 02/04/2017   BUN 15 02/04/2017   CO2 30 02/04/2017   TSH 2.61 08/06/2016   PSA 1.08 08/01/2014   HGBA1C 6.7 (H) 02/04/2017   MICROALBUR 1.2 08/06/2016    Dg Chest 2 View  Result Date: 01/19/2017 CLINICAL DATA:  Shaking episode with weakness. EXAM: CHEST  2 VIEW COMPARISON:  11/19/2010 FINDINGS: The heart size is stable and normal after prior CABG. There is no evidence of pulmonary edema, consolidation, pneumothorax, nodule or pleural fluid. The visualized skeletal structures are unremarkable. IMPRESSION: No active cardiopulmonary disease. Electronically Signed   By: Aletta Edouard M.D.   On: 01/19/2017 19:17   Ct Head Wo Contrast  Result Date: 01/19/2017 CLINICAL DATA:  Diffuse shaking, left greater than right. EXAM: CT HEAD WITHOUT CONTRAST TECHNIQUE: Contiguous axial images were obtained from the base of the skull through the vertex without intravenous  contrast. COMPARISON:  Temporal bone CT 01/24/2015, brain MRI 01/15/2015 FINDINGS: Brain: Mild atrophy and chronic small vessel ischemia, stable from prior MRI allowing for cross modality comparison. No intracranial hemorrhage, mass effect, or midline shift. No hydrocephalus. The basilar cisterns are patent. No evidence of territorial infarct. No extra-axial or intracranial fluid collection. Vascular: No hyperdense vessel. Skull: No fracture or focal lesion. Sinuses/Orbits: Area of sclerosis in the right temporal bone on prior temporal bone CT is not as well-defined on full field of view exam. Visualized paranasal sinuses are clear. Orbits are unremarkable. Other: None. IMPRESSION: 1.  No acute intracranial abnormality. 2. Mild atrophy and chronic small vessel ischemia. Electronically Signed   By: Jeb Levering M.D.   On: 01/19/2017 18:54    Assessment & Plan:   Problem List Items Addressed This Visit    3-vessel coronary artery disease   Relevant Orders   Lipid panel (Completed)   Hypertension   Relevant Orders   Comprehensive metabolic panel (Completed)   Parkinson's disease ([place])    With recent exaggeration of tremor leading to ER visit. ER  Records reviewed with patient and reassurance provided .      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  Advised to continue exercising and follow low GI diet.  Lab Results  Component Value Date   HGBA1C 6.7 (H) 02/04/2017   Lab Results  Component Value Date   MICROALBUR 1.2 08/06/2016  '      Relevant Orders   Hemoglobin A1c (Completed)    Other Visit Diagnoses    Bilateral calf pain    -  Primary   Relevant Orders   VAS [place] ABI WITH/WO TBI      I have discontinued Mr. Sobiech's predniSONE. I am also having him start on benzonatate. Additionally, I am having him maintain his multivitamin, aspirin, Fish Oil, omeprazole, clobetasol cream, isosorbide  mononitrate, zolpidem, simvastatin, carvedilol, nitroGLYCERIN, polyethylene glycol, carbidopa-levodopa, ranolazine, tamsulosin, hydrochlorothiazide, losartan, and diazepam.  Meds ordered this encounter  Medications  . benzonatate (TESSALON) 200 MG capsule    Sig: Take 1 capsule (200 mg total) by mouth 3 (three) times daily as needed for  cough.    Dispense:  60 capsule    Refill:  1    Medications Discontinued During This Encounter  Medication Reason  . isosorbide mononitrate (IMDUR) 60 MG 24 hr tablet Duplicate  . omeprazole (PRILOSEC) 20 MG capsule Duplicate  . predniSONE (DELTASONE) 10 MG tablet Patient has not taken in last 30 days  . simvastatin (ZOCOR) 20 MG tablet Duplicate    Follow-up: No Follow-up on file.   [name], MD

## 2017-02-04 NOTE — Assessment & Plan Note (Signed)
Remains well controlled  On diet alone, but A1c has jumped.  Advised to continue exercising and follow low GI diet.  Lab Results  Component Value Date   HGBA1C 6.7 (H) 02/04/2017   Lab Results  Component Value Date   MICROALBUR 1.2 08/06/2016  '

## 2017-02-04 NOTE — Patient Instructions (Addendum)
Your current respiratory symptoms suggest an adenovirus infection.  These viruses are spread by air or contact and USUALLY RESOLVE IN 5 TO 7 DAYS.   To keep your sinuses from developing a bacterial infection:   You should try [employer]'s Sinus rinse ;  It is a stong sinus "flush" using water and medicated salts.  Do it over the sink because it can be a bit messy.   To control your cough:   I AM PRESCRIBING TESSALON PERLES .    If you develop T > 100.4,  Green nasal discharge,  Or facial pain, you may need to start an antibiotic. Call the office    I have ordered an evaluation of your lower extremity circulation to make sure your calf burning is not due to this .  Dr Tyrell Antonio office will do this for you.

## 2017-02-04 NOTE — Assessment & Plan Note (Addendum)
With recent exaggeration of tremor leading to ER visit. ER  Records reviewed with patient and reassurance provided .

## 2017-02-10 NOTE — Progress Notes (Signed)
Johnathan Arnold. was seen today in the movement disorders clinic for neurologic consultation at the request of [name], MD.  This patient is accompanied in the office by his wife who supplements the history.  The consultation is for the evaluation of PD.  Pt currently under the care of Dr Manuella Ghazi and I have reviewed his records.  Began to see Dr. Manuella Ghazi in June, 2017.  His first symptom was right arm tremor.  By the time he began to see Dr. Manuella Ghazi in June, 2017 he had tremor in both hands.  He was started on carbidopa/levodopa 25/100, one tablet 3 times per day in June, 2017. Pt states that it did help tremor. He last saw Dr. Brigitte Pulse on 09/02/2016.  His levodopa was increased to 1-1/2 tablets 3 times per day (6-8am/1-2pm/6-7pm).  He cannot tell when the med wears off.   Specific Symptoms:  Tremor: Yes.  , mostly R hand at rest.   Family hx of similar:  No. (grandfather with tremor) Voice: maybe slight decrease Sleep: sleeps well  Vivid Dreams:  Yes.    Acting out dreams:  Yes.   (screams, has fallen out of bed) Wet Pillows: just a little Postural symptoms:  No.  Falls?  No. Bradykinesia symptoms: slight drag of the R leg Loss of smell: has never had great sense of smell Loss of taste:  No. Urinary Incontinence:  No. Difficulty Swallowing:  No. Handwriting, micrographia: Yes.   Trouble with ADL's:  No. (does brace self when putting on pants)  Trouble buttoning clothing: No. Depression:  No. Memory changes:  Some naming trouble (people) Hallucinations:  No.  visual distortions: No. N/V:  No. Lightheaded:  No. 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 Diplopia:  No. Dyskinesia:  No.  Patient had an MRI of the brain on 01/15/2015.  I had the opportunity to review this.  There was very mild small vessel disease.  Enhancing 11 x 13 mm cross-section soft tissue abnormality in the  RIGHT lateral mastoid. Cholesteatoma not excluded.   02/12/17 update:  Patient seen  today in follow-up for Parkinson's disease.  This patient is accompanied in the office by his spouse who supplements the history.   I asked him to hold his levodopa today, since I have never seen him off of levodopa.  He has been off of it for 24 hours.  He generally is on carbidopa/levodopa 25/100, 1.5 tablets 3 times per day.  I have reviewed records since our last visit.  He was in the emergency room on 01/19/2017 with complaints of shaking.  He felt that this was worse than his usual shaking and on the opposite side.  Workup was unremarkable in the emergency room.   Pt states that he was up in [place] with friends.  He felt like he was getting vertigo.  He was getting nauseated.  He took a valium for the vertigo.  He drove home.  He went to get out of the car and he was shaking on the L arm and leg.  Friends assisted him into the house.  Both legs felt weak but only the L was shaking and it only shook when he stood.  The shaking lasted minutes and seemed to go away when he laid down on the couch.  He went to sleep.  When he woke up he decided to get checked out.  He went to [facility] few weeks ago and sons asked  him to see another neurologist and they recommended neupro.  Pt didn't start that medication but has some samples.  One episode dizziness getting out of bed because got up too quickly.   He does c/o RLS.  That seems to come and go.  Wife notices him flailing around in sleep and its disturbing her sleep.    PREVIOUS MEDICATIONS: Sinemet  ALLERGIES:  No Known Allergies  CURRENT MEDICATIONS:  Outpatient Encounter Prescriptions as of 02/12/2017  Medication Sig  . aspirin 81 MG tablet Take by mouth. Takes 2 tablets daily.  . carbidopa-levodopa (SINEMET IR) 25-100 MG tablet Take 1 tablet by mouth 3 (three) times daily.   . carvedilol (COREG) 6.25 MG tablet TAKE 1 TABLET BY MOUTH TWICE DAILY  . clobetasol cream (TEMOVATE) 4.40 % Apply 1 application topically 2 (two) times daily. Apply medication once a  week  . diazepam (VALIUM) 5 MG tablet Take 1 tablet (5 mg total) by mouth every 8 (eight) hours as needed. For vertigo  . hydrochlorothiazide (HYDRODIURIL) 25 MG tablet Take 25 mg by mouth daily.   . isosorbide mononitrate (IMDUR) 60 MG 24 hr tablet TAKE 1 TABLET BY MOUTH EVERY DAY. ALSO TAKE 1 30 MG TABLET EVERY DAY (Patient taking differently: 1 daily)  . losartan (COZAAR) 25 MG tablet TAKE 1 TABLET BY MOUTH EVERY DAY  . Multiple Vitamin (MULTIVITAMIN) tablet Take 1 tablet by mouth daily.    . nitroGLYCERIN (NITROSTAT) 0.4 MG SL tablet Place under the tongue.  . Omega-3 Fatty Acids (FISH OIL) 1200 MG CAPS Take by mouth daily.  [place] omeprazole (PRILOSEC) 20 MG capsule TAKE 1 CAPSULE BY MOUTH EVERY MORNING AS DIRECTED  . polyethylene glycol (MIRALAX / GLYCOLAX) packet Take by mouth.  . ranolazine (RANEXA) 1000 MG SR tablet TAKE 1 TABLET(1000 MG) BY MOUTH TWICE DAILY  . simvastatin (ZOCOR) 20 MG tablet TAKE 1 TABLET BY MOUTH EVERY NIGHT AT BEDTIME  . tamsulosin (FLOMAX) 0.4 MG CAPS capsule Take 1 capsule (0.4 mg total) by mouth daily.  [place] Kitchen zolpidem (AMBIEN) 10 MG tablet TAKE 1 TABLET BY MOUTH EVERY NIGHT AT BEDTIME AS NEEDED FOR SLEEP  . clonazePAM (KLONOPIN) 0.5 MG tablet Take 0.5 tablets (0.25 mg total) by mouth at bedtime.  . [DISCONTINUED] benzonatate (TESSALON) 200 MG capsule Take 1 capsule (200 mg total) by mouth 3 (three) times daily as needed for cough.   No facility-administered encounter medications on file as of 02/12/2017.     PAST MEDICAL HISTORY:   Past Medical History:  Diagnosis Date  . 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 Diabetes mellitus without complication ([place])   . History of cardiac catheterization 2011   [facility]  . Hyperlipidemia   . Hypertension   . Hypertriglyceridemia   . Parkinson's disease ([place])   . 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 Vertigo     PAST SURGICAL HISTORY:   Past Surgical History:  Procedure Laterality Date  . CARDIAC CATHETERIZATION   05-19-2010   [facility]: Patent grafts. LIMA to LAD, SVG to D1, OM1 and RPDA  . CORONARY ARTERY BYPASS GRAFT  03/1998   5 vessel, [place]    SOCIAL HISTORY:   Social History   Social History  . Marital status: Married    Spouse name: N/A  . Number of children: N/A  . Years of education: N/A   Occupational History  . retired     IT work   Social History Main Topics  .  Smoking status: Never Smoker  . Smokeless tobacco: Never Used  . Alcohol use Yes     Comment: occasional beer  . Drug use: No  . Sexual activity: Not Currently   Other Topics Concern  . Not on file   Social History Narrative  . No narrative on file    FAMILY HISTORY:   Family Status  Relation Status  . Mother Deceased  . Father Deceased  . Brother Alive  . Son Alive    ROS: A complete 10 system review of systems was obtained and was unremarkable apart from what is mentioned above.  PHYSICAL EXAMINATION:    VITALS:   Vitals:   02/12/17 1051  SpO2: 97%  Weight: 187 lb (84.8 kg)  Height: 5\' 6"  (1.676 m)     Orthostatic VS for the past 24 hrs (Last 3 readings):  BP- Lying Pulse- Lying BP- Sitting Pulse- Sitting BP- Standing at 0 minutes Pulse- Standing at 0 minutes  02/12/17 1052 128/60 56 120/72 60 110/64 64     GEN:  The patient appears stated age and is in NAD. HEENT:  Normocephalic, atraumatic.  The mucous membranes are moist. The superficial temporal arteries are without ropiness or tenderness. CV:  Bradycardic.  Regular rhythm. Lungs:  CTAB Neck/HEME:  There are no carotid bruits bilaterally.  Neurological examination:  Orientation: The patient is alert and oriented x3.  Cranial nerves: There is good facial symmetry. Pupils are equal round and reactive to light bilaterally. Fundoscopic exam reveals clear margins bilaterally. Extraocular muscles are intact. The visual fields are full to confrontational testing. The speech is fluent and clear. Soft palate rises symmetrically and there  is no tongue deviation. Hearing is intact to conversational tone. Sensation: Sensation is intact to light touch throughout Motor: Strength is 5/5 in the bilateral upper and lower extremities.   Shoulder shrug is equal and symmetric.  There is no pronator drift.   Movement examination: Tone: There is normal tone in the bilateral upper extremities.  The tone in the lower extremities is normal.  Abnormal movements: There is a RUE resting tremor Coordination:  There is no decremation with RAM's, with any form of RAMS, including alternating supination and pronation of the forearm, hand opening and closing, finger taps, heel taps and toe taps. Gait and Station: The patient has no difficulty arising out of a deep-seated chair without the use of the hands. The patient's stride length is normal with somewhat purposeful arm swing.  Able to ambulate in a heel toe fashion and in a tandem fashion.  The patient has a negative pull test.      Lab Results  Component Value Date   WBC 5.1 01/19/2017   HGB 12.4 (L) 01/19/2017   HCT 35.5 (L) 01/19/2017   MCV 97.6 01/19/2017   PLT 209 01/19/2017   Lab Results  Component Value Date   VITAMINB12 575 08/06/2015     ASSESSMENT/PLAN:  1.  Idiopathic Parkinson's disease, tremor predominant.  Dx: 11/2015  -We discussed that it used to be thought that levodopa would increase risk of melanoma but now it is believed that Parkinsons itself likely increases risk of melanoma. he is to get regular skin checks.  -He had been off of medication for 24 hours when I saw him today.  While he certainly has a parkinsonian tremor, he still does not meet formal criteria for Parkinson's disease.  He and I discussed this today.  Ultimately, however, he decided to get back on levodopa because  he felt better on it and it did help the tremor.  I have no objection to that.  He likely does have developing Parkinson's.  He will take 1-1/2 tablets of carbidopa/levodopa 25/100, 3 times per  day.  -He remains in rock study boxing and I encouraged him to continue that.  -We discussed community resources in the area including patient support groups and community exercise programs for PD and pt education was provided to the patient.    -Had a second opinion in [place] and was given Neupro patches.  Patient did not take them.  While this is not a bad drug, ultimately, this would require him to try a different agonist first as Neupro was not considered first line generally by Medicare because of cost.  In addition, I am not sure that adds much right now and the patient agreed.  2.  Hx of HTN  -PD is really one true cure for HTN.  On several BP lowering agents and BP low in office today.  If able, may want to consider lowering meds.  3.  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  Pt states known fx years ago.  Will monitor clinically  4.  RLS with plmd  -add klonopin, 0.5 mg, half tablet at night.  Risks, benefits, side effects and alternative therapies were discussed.  The opportunity to ask questions was given and they were answered to the best of my ability.  The patient expressed understanding and willingness to follow the outlined treatment protocols.  -will do iron studies given mild anemia to make sure not contributing to rls  5.  Episode of L sided shaking  -don't think that related to seizure but should do EEG since focal and doesn't sound like parkinsonian tremor.  Pt agreeable.   6.  Follow up is anticipated in the next few months, sooner should new neurologic issues arise.  Much greater than 50% of this visit was spent in counseling and coordinating care.  Total face to face time:  30 min  Cc:  [name], MD

## 2017-02-12 ENCOUNTER — Encounter: Payer: Self-pay | Admitting: Neurology

## 2017-02-12 ENCOUNTER — Ambulatory Visit (INDEPENDENT_AMBULATORY_CARE_PROVIDER_SITE_OTHER): Payer: Medicare Other | Admitting: Neurology

## 2017-02-12 ENCOUNTER — Other Ambulatory Visit: Payer: Medicare Other

## 2017-02-12 VITALS — Ht 66.0 in | Wt 187.0 lb

## 2017-02-12 DIAGNOSIS — D649 Anemia, unspecified: Secondary | ICD-10-CM | POA: Diagnosis not present

## 2017-02-12 DIAGNOSIS — R251 Tremor, unspecified: Secondary | ICD-10-CM

## 2017-02-12 DIAGNOSIS — G20 Parkinson's disease: Secondary | ICD-10-CM | POA: Diagnosis not present

## 2017-02-12 DIAGNOSIS — I2510 Atherosclerotic heart disease of native coronary artery without angina pectoris: Secondary | ICD-10-CM

## 2017-02-12 DIAGNOSIS — G2581 Restless legs syndrome: Secondary | ICD-10-CM

## 2017-02-12 LAB — IRON,TIBC AND FERRITIN PANEL
%SAT: 41 % (ref 15–60)
FERRITIN: 138 ng/mL (ref 20–380)
Iron: 121 ug/dL (ref 50–180)
TIBC: 295 ug/dL (ref 250–425)

## 2017-02-12 MED ORDER — CLONAZEPAM 0.5 MG PO TABS
0.2500 mg | ORAL_TABLET | Freq: Every day | ORAL | 0 refills | Status: DC
Start: 2017-02-12 — End: 2017-05-04

## 2017-02-12 NOTE — Patient Instructions (Addendum)
1. Your provider has requested that you have labwork completed today. Please go to [facility] ([id number]) on the second floor of this building before leaving the office today. You do not need to check in. If you are not called within 15 minutes please check with the front desk.   2. Start Clonazepam 0.5 mg - 1/2 tablet at bedtime.   3. Schedule EEG.

## 2017-02-13 ENCOUNTER — Telehealth: Payer: Self-pay | Admitting: Internal Medicine

## 2017-02-13 ENCOUNTER — Telehealth: Payer: Self-pay | Admitting: Neurology

## 2017-02-13 DIAGNOSIS — E875 Hyperkalemia: Secondary | ICD-10-CM

## 2017-02-13 NOTE — Telephone Encounter (Signed)
Pt called to inquire about sch a repeat Bmet lab. Please advise? Thank you!

## 2017-02-13 NOTE — Telephone Encounter (Signed)
-----   Message from [place], DO sent at 02/13/2017  7:18 AM EDT ----- Tell patient labs are okay

## 2017-02-13 NOTE — Telephone Encounter (Signed)
Mychart message sent to patient.

## 2017-02-17 NOTE — Telephone Encounter (Signed)
Spoke with pt and was able to schedule him a lab appt. Lab has been ordered.

## 2017-02-18 ENCOUNTER — Ambulatory Visit: Payer: Medicare Other

## 2017-02-18 DIAGNOSIS — M79662 Pain in left lower leg: Principal | ICD-10-CM

## 2017-02-18 DIAGNOSIS — I739 Peripheral vascular disease, unspecified: Secondary | ICD-10-CM | POA: Diagnosis not present

## 2017-02-18 DIAGNOSIS — M79661 Pain in right lower leg: Secondary | ICD-10-CM

## 2017-02-20 ENCOUNTER — Other Ambulatory Visit (INDEPENDENT_AMBULATORY_CARE_PROVIDER_SITE_OTHER): Payer: Medicare Other

## 2017-02-20 DIAGNOSIS — E875 Hyperkalemia: Secondary | ICD-10-CM

## 2017-02-20 LAB — BASIC METABOLIC PANEL WITH GFR
BUN/Creatinine Ratio: 9 (calc) (ref 6–22)
BUN: 16 mg/dL (ref 7–25)
CALCIUM: 9.2 mg/dL (ref 8.6–10.3)
CHLORIDE: 103 mmol/L (ref 98–110)
CO2: 23 mmol/L (ref 20–32)
Creat: 1.76 mg/dL — ABNORMAL HIGH (ref 0.70–1.18)
GFR, Est African American: 44 mL/min/{1.73_m2} — ABNORMAL LOW (ref >=60)
GFR, Est Non African American: 38 mL/min/{1.73_m2} — ABNORMAL LOW (ref >=60)
GLUCOSE: 214 mg/dL — AB (ref 65–99)
Potassium: 4.6 mmol/L (ref 3.5–5.3)
Sodium: 139 mmol/L (ref 135–146)

## 2017-02-21 ENCOUNTER — Encounter: Payer: Self-pay | Admitting: Internal Medicine

## 2017-02-23 ENCOUNTER — Other Ambulatory Visit: Payer: Medicare Other

## 2017-02-23 ENCOUNTER — Other Ambulatory Visit: Payer: Self-pay | Admitting: Cardiovascular Disease

## 2017-02-24 ENCOUNTER — Encounter: Payer: Self-pay | Admitting: Cardiovascular Disease

## 2017-02-24 ENCOUNTER — Other Ambulatory Visit: Payer: Self-pay | Admitting: Cardiovascular Disease

## 2017-02-24 ENCOUNTER — Other Ambulatory Visit: Payer: Self-pay | Admitting: *Deleted

## 2017-02-24 MED ORDER — NITROGLYCERIN 0.4 MG SL SUBL: 0.4000 mg | SUBLINGUAL_TABLET | SUBLINGUAL | 1 refills | Status: DC | PRN

## 2017-02-25 ENCOUNTER — Ambulatory Visit (INDEPENDENT_AMBULATORY_CARE_PROVIDER_SITE_OTHER): Payer: Medicare Other | Admitting: Neurology

## 2017-02-25 DIAGNOSIS — R251 Tremor, unspecified: Secondary | ICD-10-CM | POA: Diagnosis not present

## 2017-02-26 ENCOUNTER — Telehealth: Payer: Self-pay | Admitting: Neurology

## 2017-02-26 NOTE — Telephone Encounter (Signed)
-----   Message from [place], DO sent at 02/26/2017  1:43 PM EDT ----- Let pt know that EEG normal

## 2017-02-26 NOTE — Procedures (Signed)
TECHNICAL SUMMARY:  A multichannel referential and bipolar montage EEG using the standard international 10-20 system was performed on the patient described as awake, drowsy and asleep.  The dominant background activity consists of 8.5-9.5 hertz activity seen most prominantly over the posterior head region.  The backgound activity is reactive to eye opening and closing procedures.  Low voltage fast (beta) activity is distributed symmetrically and maximally over the anterior head regions.  ACTIVATION:  Stepwise photic stimulation at 4-20 flashes per second was performed and did not elicit any abnormal waveforms.  Hyperventilation was performed for 3 minutes with good patient effort and produced no changes in the background activity.  EPILEPTIFORM ACTIVITY:  There were no spikes, sharp waves or paroxysmal activity.  SLEEP:  Both stage 1 and 2 sleep were noted  CARDIAC:  The EKG lead revealed a regular sinus rhythm.  IMPRESSION:  This is a normal EEG for the patients stated age.  There were no focal, hemispheric or lateralizing features.  No epileptiform activity was recorded.  A normal EEG does not exclude the diagnosis of a seizure disorder and if seizure remains high on the list of differential diagnosis, an ambulatory EEG may be of value.  Clinical correlation is required.

## 2017-02-26 NOTE — Telephone Encounter (Signed)
Tried to call patient with no answer.   Sent mychart message to patient.

## 2017-02-27 ENCOUNTER — Ambulatory Visit (INDEPENDENT_AMBULATORY_CARE_PROVIDER_SITE_OTHER): Payer: Medicare Other | Admitting: Urology

## 2017-02-27 ENCOUNTER — Encounter: Payer: Self-pay | Admitting: Urology

## 2017-02-27 VITALS — BP 155/70 | HR 61 | Ht 66.0 in | Wt 193.0 lb

## 2017-02-27 DIAGNOSIS — M9903 Segmental and somatic dysfunction of lumbar region: Secondary | ICD-10-CM | POA: Diagnosis not present

## 2017-02-27 DIAGNOSIS — M5431 Sciatica, right side: Secondary | ICD-10-CM | POA: Diagnosis not present

## 2017-02-27 DIAGNOSIS — N480 Leukoplakia of penis: Secondary | ICD-10-CM | POA: Diagnosis not present

## 2017-02-27 DIAGNOSIS — N400 Enlarged prostate without lower urinary tract symptoms: Secondary | ICD-10-CM | POA: Diagnosis not present

## 2017-02-27 DIAGNOSIS — I2510 Atherosclerotic heart disease of native coronary artery without angina pectoris: Secondary | ICD-10-CM

## 2017-02-27 DIAGNOSIS — Z125 Encounter for screening for malignant neoplasm of prostate: Secondary | ICD-10-CM | POA: Diagnosis not present

## 2017-02-27 DIAGNOSIS — M5136 Other intervertebral disc degeneration, lumbar region: Secondary | ICD-10-CM | POA: Diagnosis not present

## 2017-02-27 DIAGNOSIS — M9902 Segmental and somatic dysfunction of thoracic region: Secondary | ICD-10-CM | POA: Diagnosis not present

## 2017-02-27 MED ORDER — CLOBETASOL PROPIONATE 0.05 % EX CREA: 1.0000 "application " | TOPICAL_CREAM | CUTANEOUS | 2 refills | Status: DC

## 2017-02-27 NOTE — Progress Notes (Signed)
02/27/2017 2:14 PM   Johnathan Arnold November 27, 1945 [id number]  Referring provider: [name], MD [place], [place] [address]  Chief Complaint  Patient presents with  . 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   1. BXO The patient underwent a penile biopsy, cystoscopy, meatal dilation on 03/20/2014 for penile lesions and spraying with urination. Intraoperatively he was noted to have meatal stenosis consistent with BXO and required dilation. Pathology showed evidence of lichen sclerosis. He reports a good stream now and is using clobestol. He continues to use this medication once per week.   2. BPH He is on flomax. He reports nocturia 1-2. He feels that he empties his bladder. He has good stream. He denies hesitancy, urgency, and straining.  3. Prostate cancer screening Due for DRE/PSA today     PMH: Past Medical History:  Diagnosis Date  . 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 Diabetes mellitus without complication ([place])   . History of cardiac catheterization 2011   [facility]  . Hyperlipidemia   . Hypertension   . Hypertriglyceridemia   . Parkinson's disease ([place])   . 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 Vertigo     Surgical History: Past Surgical History:  Procedure Laterality Date  . CARDIAC CATHETERIZATION  05-19-2010   [facility]: Patent grafts. LIMA to LAD, SVG to D1, OM1 and RPDA  . CORONARY ARTERY BYPASS GRAFT  03/1998   5 vessel, [facility]    Home Medications:  Allergies as of 02/27/2017   No Known Allergies     Medication List       Accurate as of 02/27/17  2:14 PM. Always use your most recent med list.          aspirin 81 MG tablet Take by mouth. Takes 2 tablets daily.   carbidopa-levodopa 25-100 MG tablet Commonly known as:  SINEMET IR Take 1 tablet by mouth 3 (three) times daily.   carvedilol 6.25 MG tablet Commonly known as:   COREG TAKE 1 TABLET BY MOUTH TWICE DAILY   carvedilol 6.25 MG tablet Commonly known as:  COREG TAKE 1 TABLET BY MOUTH TWICE DAILY   clobetasol cream 0.05 % Commonly known as:  TEMOVATE Apply 1 application topically once a week. Apply medication once a week   clonazePAM 0.5 MG tablet Commonly known as:  KLONOPIN Take 0.5 tablets (0.25 mg total) by mouth at bedtime.   diazepam 5 MG tablet Commonly known as:  VALIUM Take 1 tablet (5 mg total) by mouth every 8 (eight) hours as needed. For vertigo   Fish Oil 1200 MG Caps Take by mouth daily.   hydrochlorothiazide 25 MG tablet Commonly known as:  HYDRODIURIL Take 25 mg by mouth daily.   isosorbide mononitrate 60 MG 24 hr tablet Commonly known as:  IMDUR TAKE 1 TABLET BY MOUTH EVERY DAY. ALSO TAKE 1 30 MG TABLET EVERY DAY   losartan 25 MG tablet Commonly known as:  COZAAR TAKE 1 TABLET BY MOUTH EVERY DAY   multivitamin tablet Take 1 tablet by mouth daily.   nitroGLYCERIN 0.4 MG SL tablet Commonly known as:  NITROSTAT Place 1 tablet (0.4 mg total) under the tongue every 5 (five) minutes as needed for chest pain.   omeprazole 20 MG capsule Commonly known as:  PRILOSEC TAKE 1 CAPSULE BY MOUTH  EVERY MORNING AS DIRECTED   polyethylene glycol packet Commonly known as:  MIRALAX / GLYCOLAX Take by mouth.   ranolazine 1000 MG SR tablet Commonly known as:  RANEXA TAKE 1 TABLET(1000 MG) BY MOUTH TWICE DAILY   simvastatin 20 MG tablet Commonly known as:  ZOCOR TAKE 1 TABLET BY MOUTH EVERY NIGHT AT BEDTIME   tamsulosin 0.4 MG Caps capsule Commonly known as:  FLOMAX Take 1 capsule (0.4 mg total) by mouth daily.   zolpidem 10 MG tablet Commonly known as:  AMBIEN TAKE 1 TABLET BY MOUTH EVERY NIGHT AT BEDTIME AS NEEDED FOR SLEEP            Discharge Care Instructions        Start     Ordered   02/27/17 0000  PSA     02/27/17 1359   02/27/17 0000  clobetasol cream (TEMOVATE) 0.05 %  Weekly     02/27/17 1414       Allergies: No Known Allergies  Family History: Family History  Problem Relation Age of Onset  . Heart attack Mother 25  . Hypertension Mother   . Heart attack Father 32  . Heart disease Father   . Heart disease Brother     Social History:  reports that he has never smoked. He has never used smokeless tobacco. He reports that he drinks alcohol. He reports that he does not use drugs.  ROS: UROLOGY Frequent Urination?: No Hard to postpone urination?: No Burning/pain with urination?: No Get up at night to urinate?: No Leakage of urine?: No Urine stream starts and stops?: No Trouble starting stream?: No Do you have to strain to urinate?: No Blood in urine?: No Urinary tract infection?: No Sexually transmitted disease?: No Injury to kidneys or bladder?: No Painful intercourse?: No Weak stream?: No Erection problems?: No Penile pain?: No  Gastrointestinal Nausea?: No Vomiting?: No Indigestion/heartburn?: No Diarrhea?: No Constipation?: No  Constitutional Fever: No Night sweats?: No Weight loss?: No Fatigue?: No  Skin Skin rash/lesions?: No Itching?: No  Eyes Blurred vision?: No Double vision?: No  Ears/Nose/Throat Sore throat?: No Sinus problems?: No  Hematologic/Lymphatic Swollen glands?: No Easy bruising?: No  Cardiovascular Leg swelling?: No Chest pain?: No  Respiratory Cough?: No Shortness of breath?: No  Endocrine Excessive thirst?: No  Musculoskeletal Back pain?: No Joint pain?: No  Neurological Headaches?: No Dizziness?: No  Psychologic Depression?: No Anxiety?: No  Physical Exam: BP (!) 155/70   Pulse 61   Ht 5\' 6"  (1.676 m)   Wt 193 lb (87.5 kg)   BMI 31.15 kg/m   Constitutional:  Alert and oriented, No acute distress. HEENT: [place] AT, moist mucus membranes.  Trachea midline, no masses. Cardiovascular: No clubbing, cyanosis, or edema. Respiratory: Normal respiratory effort, no increased work of breathing. GI: Abdomen  is soft, nontender, nondistended, no abdominal masses GU: No CVA tenderness. Minimal changes to glands from BX O. No significant meatal stenosis. Otherwise normal phallus. Testicles ascended bilaterally benign. DRE: 2+ smooth benign. Skin: No rashes, bruises or suspicious lesions. Lymph: No cervical or inguinal adenopathy. Neurologic: Grossly intact, no focal deficits, moving all 4 extremities. Psychiatric: Normal mood and affect.  Laboratory Data: Lab Results  Component Value Date   WBC 5.1 01/19/2017   HGB 12.4 (L) 01/19/2017   HCT 35.5 (L) 01/19/2017   MCV 97.6 01/19/2017   PLT 209 01/19/2017    Lab Results  Component Value Date   CREATININE 1.76 (H) 02/20/2017    Lab Results  Component  Value Date   PSA 1.08 08/01/2014   PSA 1.00 07/07/2013   PSA 1.42 06/23/2012    Lab Results  Component Value Date   TESTOSTERONE 281 (L) 09/21/2012    Lab Results  Component Value Date   HGBA1C 6.7 (H) 02/04/2017    Urinalysis    Component Value Date/Time   COLORURINE YELLOW (A) 01/19/2017 1907   APPEARANCEUR CLEAR (A) 01/19/2017 1907   APPEARANCEUR Clear 02/27/2015 1020   LABSPEC 1.008 01/19/2017 1907   LABSPEC 1.009 03/15/2014 1005   PHURINE 6.0 01/19/2017 1907   GLUCOSEU NEGATIVE 01/19/2017 1907   GLUCOSEU Negative 03/15/2014 1005   HGBUR NEGATIVE 01/19/2017 1907   BILIRUBINUR NEGATIVE 01/19/2017 1907   BILIRUBINUR Negative 02/27/2015 1020   BILIRUBINUR Negative 03/15/2014 1005   KETONESUR NEGATIVE 01/19/2017 1907   PROTEINUR NEGATIVE 01/19/2017 1907   UROBILINOGEN 1.0 03/16/2012 1630   NITRITE NEGATIVE 01/19/2017 1907   LEUKOCYTESUR TRACE (A) 01/19/2017 1907   LEUKOCYTESUR Negative 02/27/2015 1020   LEUKOCYTESUR Negative 03/15/2014 1005    Assessment & Plan:   1. Benign prostatic hypertrophy with urinary frequency -Continue Flomax 0.4 mg daily at bedtime  2. BXO (balanitis xerotica obliterans) -I discussed with the patient again that the clobestol will slow  the disease progression. He will continue this medication on a weekly basis as it seems to be helping slow progression.  3. Prostate cancer screening -Due for PSA today  Return in about 1 year (around 02/27/2018).  Nickie Retort, MD  [facility] [address] [place], [place] [address] [phone]

## 2017-02-28 LAB — PSA: PROSTATE SPECIFIC AG, SERUM: 1.1 ng/mL (ref 0.0–4.0)

## 2017-03-05 ENCOUNTER — Telehealth: Payer: Self-pay

## 2017-03-05 NOTE — Telephone Encounter (Signed)
Johnathan Retort, MD  Lestine Box, LPN        Please let patient know PSA is normal. Thanks.    LMOM

## 2017-03-06 NOTE — Telephone Encounter (Signed)
Patient notified of his PSA results.

## 2017-03-10 ENCOUNTER — Other Ambulatory Visit: Payer: Self-pay | Admitting: Cardiovascular Disease

## 2017-03-13 ENCOUNTER — Encounter: Payer: Self-pay | Admitting: Cardiovascular Disease

## 2017-03-13 ENCOUNTER — Ambulatory Visit (INDEPENDENT_AMBULATORY_CARE_PROVIDER_SITE_OTHER): Payer: Medicare Other | Admitting: Cardiovascular Disease

## 2017-03-13 VITALS — BP 120/64 | HR 54 | Ht 67.0 in | Wt 192.0 lb

## 2017-03-13 DIAGNOSIS — R079 Chest pain, unspecified: Secondary | ICD-10-CM | POA: Diagnosis not present

## 2017-03-13 DIAGNOSIS — I209 Angina pectoris, unspecified: Secondary | ICD-10-CM | POA: Diagnosis not present

## 2017-03-13 DIAGNOSIS — Z23 Encounter for immunization: Secondary | ICD-10-CM

## 2017-03-13 DIAGNOSIS — I25118 Atherosclerotic heart disease of native coronary artery with other forms of angina pectoris: Secondary | ICD-10-CM | POA: Diagnosis not present

## 2017-03-13 DIAGNOSIS — R0602 Shortness of breath: Secondary | ICD-10-CM

## 2017-03-13 DIAGNOSIS — I10 Essential (primary) hypertension: Secondary | ICD-10-CM | POA: Diagnosis not present

## 2017-03-13 DIAGNOSIS — I2510 Atherosclerotic heart disease of native coronary artery without angina pectoris: Secondary | ICD-10-CM | POA: Diagnosis not present

## 2017-03-13 NOTE — Progress Notes (Signed)
Cardiology Office Note   Date:  03/13/2017   ID:  Johnathan Jefferys., DOB January 08, 1946, MRN [id number]  PCP:  Johnathan Mc, MD  Cardiologist:   Johnathan Sacramento, MD   Chief Complaint  Patient presents with  . other    12 month follow up. Meds reviewed by the pt. verbally. Pt. c/o shortness of breath, weakness, bilateral calf heaviness with walking with occas. chest tightness.       History of Present Illness: Johnathan Arnold. is a 71 y.o. male who presents for a followup visit.  He has known history of coronary artery disease status post CABG in 1999. He also has known history of hypertension, chronic kidney disease, hyperlipidemia and type 2 diabetes.He underwent a cardiac catheterization in 2011 which showed patent grafts. He continued to have mild exertional chest pain. A treadmill nuclear stress test in 06/2012 showed normal ejection fraction. Fixed inferior wall defect with normal wall motion and moderate reversibility in the septal wall.  Previous carotid Doppler showed mild nonobstructive bilateral disease.  He reports worsening exertional chest pain and shortness of breath over the last few months. He still goes to exercise regularly but he feels more limited than before. He also complains of bilateral calf discomfort with activities. He has been taking his medications regularly.   Past Medical History:  Diagnosis Date  . 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 Diabetes mellitus without complication ([place])   . History of cardiac catheterization 2011   [employer]  . Hyperlipidemia   . Hypertension   . Hypertriglyceridemia   . Parkinson's disease ([place])   . 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 Vertigo     Past Surgical History:  Procedure Laterality Date  . CARDIAC CATHETERIZATION  05-19-2010   [facility]: Patent grafts. LIMA to LAD, SVG to D1, OM1 and RPDA  . CORONARY ARTERY BYPASS GRAFT  03/1998   5 vessel, [facility]     Current Outpatient Prescriptions    Medication Sig Dispense Refill  . aspirin 81 MG tablet Take by mouth. Takes 2 tablets daily.    . carbidopa-levodopa (SINEMET IR) 25-100 MG tablet Take 1 tablet by mouth 3 (three) times daily.     . carvedilol (COREG) 6.25 MG tablet TAKE 1 TABLET BY MOUTH TWICE DAILY 180 tablet 0  . clobetasol cream (TEMOVATE) 7.10 % Apply 1 application topically once a week. Apply medication once a week 30 g 2  . clonazePAM (KLONOPIN) 0.5 MG tablet Take 0.5 tablets (0.25 mg total) by mouth at bedtime. 45 tablet 0  . diazepam (VALIUM) 5 MG tablet Take 1 tablet (5 mg total) by mouth every 8 (eight) hours as needed. For vertigo 30 tablet 5  . hydrochlorothiazide (HYDRODIURIL) 25 MG tablet Take 25 mg by mouth daily.     . isosorbide mononitrate (IMDUR) 60 MG 24 hr tablet TAKE 1 TABLET BY MOUTH EVERY DAY. ALSO TAKE 1 30 MG TABLET EVERY DAY (Patient taking differently: 1 daily) 90 tablet 3  . losartan (COZAAR) 25 MG tablet TAKE 1 TABLET BY MOUTH EVERY DAY 90 tablet 1  . Multiple Vitamin (MULTIVITAMIN) tablet Take 1 tablet by mouth daily.      . nitroGLYCERIN (NITROSTAT) 0.4 MG SL tablet Place 1 tablet (0.4 mg total) under the tongue every 5 (five) minutes as needed for chest pain. 25 tablet 1  . Omega-3 Fatty Acids (FISH OIL) 1200 MG CAPS Take by mouth daily.    Marland Kitchen  omeprazole (PRILOSEC) 20 MG capsule TAKE 1 CAPSULE BY MOUTH EVERY MORNING AS DIRECTED 90 capsule 0  . polyethylene glycol (MIRALAX / GLYCOLAX) packet Take by mouth.    Marland Kitchen RANEXA 1000 MG SR tablet TAKE 1 TABLET(1000 MG) BY MOUTH TWICE DAILY 60 tablet 0  . simvastatin (ZOCOR) 20 MG tablet TAKE 1 TABLET BY MOUTH EVERY NIGHT AT BEDTIME 90 tablet 3  . tamsulosin (FLOMAX) 0.4 MG CAPS capsule Take 1 capsule (0.4 mg total) by mouth daily. 30 capsule 6  . zolpidem (AMBIEN) 10 MG tablet TAKE 1 TABLET BY MOUTH EVERY NIGHT AT BEDTIME AS NEEDED FOR SLEEP 30 tablet 0   No current facility-administered medications for this visit.     Allergies:   Patient has no known  allergies.    Social History:  The patient  reports that he has never smoked. He has never used smokeless tobacco. He reports that he drinks alcohol. He reports that he does not use drugs.   Family History:  The patient's family history includes Heart attack (age of onset: 64) in his father; Heart attack (age of onset: 32) in his mother; Heart disease in his brother and father; Hypertension in his mother.    ROS:  Please see the history of present illness.   Otherwise, review of systems are positive for none.   All other systems are reviewed and negative.    PHYSICAL EXAM: VS:  BP 120/64 (BP Location: Left Arm, Patient Position: Sitting, Cuff Size: Normal)   Pulse (!) 54   Ht 5\' 7"  (1.702 m)   Wt 192 lb (87.1 kg)   BMI 30.07 kg/m  , BMI Body mass index is 30.07 kg/m. GEN: Well nourished, well developed, in no acute distress  HEENT: normal  Neck: no JVD, carotid bruits, or masses Cardiac: RRR; no murmurs, rubs, or gallops,no edema  Respiratory:  clear to auscultation bilaterally, normal work of breathing GI: soft, nontender, nondistended, + BS MS: no deformity or atrophy  Skin: warm and dry, no rash Neuro:  Strength and sensation are intact Psych: euthymic mood, full affect Vascular: Femoral pulses are normal. Distal pulses are alcohol.  EKG:  EKG is ordered today. The ekg ordered today demonstrates sinus bradycardia with possible left atrial enlargement and nonspecific ST-T wave changes.   Recent Labs: 08/06/2016: TSH 2.61 01/19/2017: Hemoglobin 12.4; Platelets 209 02/04/2017: ALT 7 02/20/2017: BUN 16; Creat 1.76; Potassium 4.6; Sodium 139    Lipid Panel    Component Value Date/Time   CHOL 105 02/04/2017 1036   TRIG 104.0 02/04/2017 1036   HDL 42.70 02/04/2017 1036   CHOLHDL 2 02/04/2017 1036   VLDL 20.8 02/04/2017 1036   LDLCALC 41 02/04/2017 1036   LDLDIRECT 38.0 08/06/2015 1118      Wt Readings from Last 3 Encounters:  03/13/17 192 lb (87.1 kg)  02/27/17 193 lb  (87.5 kg)  02/12/17 187 lb (84.8 kg)        ASSESSMENT AND PLAN:  1.  Coronary artery disease involving native coronary arteries with worsening angina angina: Due to his worsening symptoms, I recommend a treadmill nuclear stress test for evaluation. Given his reported shortness of breath, I'm also going to obtain an echocardiogram. He is on optimal antianginal therapy.  2. Bilateral carotid artery disease:  Mild nonobstructive disease. Continue treatment of risk factors.  3. Hyperlipidemia: Continue treatment with simvastatin. Most recent LDL was 41  4. Essential hypertension: Blood pressure is well controlled on current medications  5. Exertional leg pain: His  symptoms are suggestive of claudication. However, his exam shows normal pulses in his legs laterally and recent lower extremity Doppler showed normal ABI. I doubt peripheral arterial disease.  Disposition:   FU with me in 6 months  Signed,  Johnathan Sacramento, MD  03/13/2017 12:33 PM    [place]

## 2017-03-13 NOTE — Patient Instructions (Addendum)
Medication Instructions:  Your physician recommends that you continue on your current medications as directed. Please refer to the Current Medication list given to you today.   Labwork: none  Testing/Procedures: Your physician has requested that you have an echocardiogram. Echocardiography is a painless test that uses sound waves to create images of your heart. It provides your doctor with information about the size and shape of your heart and how well your heart's chambers and valves are working. This procedure takes approximately one hour. There are no restrictions for this procedure.  Your physician has requested that you have a lexiscan myoview. For further information please visit [email or web address]. Please follow instruction sheet, as given.  Johnathan Arnold  Your caregiver has ordered a Stress Test with nuclear imaging. The purpose of this test is to evaluate the blood supply to your heart muscle. This procedure is referred to as a "Non-Invasive Stress Test." This is because other than having an IV started in your vein, nothing is inserted or "invades" your body. Cardiac stress tests are done to find areas of poor blood flow to the heart by determining the extent of coronary artery disease (CAD). Some patients exercise on a treadmill, which naturally increases the blood flow to your heart, while others who are  unable to walk on a treadmill due to physical limitations have a pharmacologic/chemical stress agent called Lexiscan . This medicine will mimic walking on a treadmill by temporarily increasing your coronary blood flow.   Please note: these test may take anywhere between 2-4 hours to complete  PLEASE REPORT TO [place] AT THE FIRST DESK WILL DIRECT YOU WHERE TO GO  Date of Procedure:___Tuesday, Oct 9_______  Arrival Time for Procedure:______7:15am__________  Instructions regarding medication:    xx____:  Hold coreg the morning of  procedure  _xx___:  Hold other medications as follows: HCTZ the morning of your test.   PLEASE NOTIFY THE OFFICE AT LEAST 24 HOURS IN ADVANCE IF YOU ARE UNABLE TO KEEP YOUR APPOINTMENT.  [phone] AND  PLEASE NOTIFY NUCLEAR MEDICINE AT [facility] AT LEAST 24 HOURS IN ADVANCE IF YOU ARE UNABLE TO KEEP YOUR APPOINTMENT. [phone]  How to prepare for your Myoview test:  1. Do not eat or drink after midnight 2. No caffeine for 24 hours prior to test 3. No smoking 24 hours prior to test. 4. Your medication may be taken with water.  If your doctor stopped a medication because of this test, do not take that medication. 5. Ladies, please do not wear dresses.  Skirts or pants are appropriate. Please wear a short sleeve shirt. 6. No perfume, cologne or lotion. 7. Wear comfortable walking shoes. No heels!            Follow-Up: Your physician wants you to follow-up in: 6 months with Johnathan Arnold.  You will receive a reminder letter in the mail two months in advance. If you don't receive a letter, please call our office to schedule the follow-up appointment.   Any Other Special Instructions Will Be Listed Below (If Applicable).     If you need a refill on your cardiac medications before your next appointment, please call your pharmacy.  Echocardiogram An echocardiogram, or echocardiography, uses sound waves (ultrasound) to produce an image of your heart. The echocardiogram is simple, painless, obtained within a short period of time, and offers valuable information to your health care provider. The images from an echocardiogram can provide information such as:  Evidence of coronary  artery disease (CAD).  Heart size.  Heart muscle function.  Heart valve function.  Aneurysm detection.  Evidence of a past heart attack.  Fluid buildup around the heart.  Heart muscle thickening.  Assess heart valve function.  Tell a health care provider about:  Any allergies you have.  All  medicines you are taking, including vitamins, herbs, eye drops, creams, and over-the-counter medicines.  Any problems you or family members have had with anesthetic medicines.  Any blood disorders you have.  Any surgeries you have had.  Any medical conditions you have.  Whether you are pregnant or may be pregnant. What happens before the procedure? No special preparation is needed. Eat and drink normally. What happens during the procedure?  In order to produce an image of your heart, gel will be applied to your chest and a wand-like tool (transducer) will be moved over your chest. The gel will help transmit the sound waves from the transducer. The sound waves will harmlessly bounce off your heart to allow the heart images to be captured in real-time motion. These images will then be recorded.  You may need an IV to receive a medicine that improves the quality of the pictures. What happens after the procedure? You may return to your normal schedule including diet, activities, and medicines, unless your health care provider tells you otherwise. This information is not intended to replace advice given to you by your health care provider. Make sure you discuss any questions you have with your health care provider. Document Released: 05/23/2000 Document Revised: 01/12/2016 Document Reviewed: 01/31/2013 Elsevier Interactive Patient Education  2017 [place]. Cardiac Nuclear Scan A cardiac nuclear scan is a test that measures blood flow to the heart when a person is resting and when he or she is exercising. The test looks for problems such as:  Not enough blood reaching a portion of the heart.  The heart muscle not working normally.  You may need this test if:  You have heart disease.  You have had abnormal lab results.  You have had heart surgery or angioplasty.  You have chest pain.  You have shortness of breath.  In this test, a radioactive dye (tracer) is injected into your  bloodstream. After the tracer has traveled to your heart, an imaging device is used to measure how much of the tracer is absorbed by or distributed to various areas of your heart. This procedure is usually done at a hospital and takes 2-4 hours. Tell a health care provider about:  Any allergies you have.  All medicines you are taking, including vitamins, herbs, eye drops, creams, and over-the-counter medicines.  Any problems you or family members have had with the use of anesthetic medicines.  Any blood disorders you have.  Any surgeries you have had.  Any medical conditions you have.  Whether you are pregnant or may be pregnant. What are the risks? Generally, this is a safe procedure. However, problems may occur, including:  Serious chest pain and heart attack. This is only a risk if the stress portion of the test is done.  Rapid heartbeat.  Sensation of warmth in your chest. This usually passes quickly.  What happens before the procedure?  Ask your health care provider about changing or stopping your regular medicines. This is especially important if you are taking diabetes medicines or blood thinners.  Remove your jewelry on the day of the procedure. What happens during the procedure?  An IV tube will be inserted into one  of your veins.  Your health care provider will inject a small amount of radioactive tracer through the tube.  You will wait for 20-40 minutes while the tracer travels through your bloodstream.  Your heart activity will be monitored with an electrocardiogram (ECG).  You will lie down on an exam table.  Images of your heart will be taken for about 15-20 minutes.  You may be asked to exercise on a treadmill or stationary bike. While you exercise, your heart's activity will be monitored with an ECG, and your blood pressure will be checked. If you are unable to exercise, you may be given a medicine to increase blood flow to parts of your heart.  When  blood flow to your heart has peaked, a tracer will again be injected through the IV tube.  After 20-40 minutes, you will get back on the exam table and have more images taken of your heart.  When the procedure is over, your IV tube will be removed. The procedure may vary among health care providers and hospitals. Depending on the type of tracer used, scans may need to be repeated 3-4 hours later. What happens after the procedure?  Unless your health care provider tells you otherwise, you may return to your normal schedule, including diet, activities, and medicines.  Unless your health care provider tells you otherwise, you may increase your fluid intake. This will help flush the contrast dye from your body. Drink enough fluid to keep your urine clear or pale yellow.  It is up to you to get your test results. Ask your health care provider, or the department that is doing the test, when your results will be ready. Summary  A cardiac nuclear scan measures the blood flow to the heart when a person is resting and when he or she is exercising.  You may need this test if you are at risk for heart disease.  Tell your health care provider if you are pregnant.  Unless your health care provider tells you otherwise, increase your fluid intake. This will help flush the contrast dye from your body. Drink enough fluid to keep your urine clear or pale yellow. This information is not intended to replace advice given to you by your health care provider. Make sure you discuss any questions you have with your health care provider. Document Released: 06/20/2004 Document Revised: 05/28/2016 Document Reviewed: 05/04/2013 Elsevier Interactive Patient Education  2017 [employer].

## 2017-03-17 ENCOUNTER — Ambulatory Visit
Admission: RE | Admit: 2017-03-17 | Discharge: 2017-03-17 | Disposition: A | Discharge status: Home / Self Care | Payer: Medicare Other | Source: Ambulatory Visit | Attending: Cardiovascular Disease | Admitting: Cardiovascular Disease

## 2017-03-17 ENCOUNTER — Other Ambulatory Visit: Payer: Self-pay | Admitting: Internal Medicine

## 2017-03-17 DIAGNOSIS — R9439 Abnormal result of other cardiovascular function study: Secondary | ICD-10-CM | POA: Insufficient documentation

## 2017-03-17 DIAGNOSIS — R079 Chest pain, unspecified: Secondary | ICD-10-CM | POA: Diagnosis not present

## 2017-03-17 MED ORDER — REGADENOSON 0.4 MG/5ML IV SOLN
0.4000 mg | Freq: Once | INTRAVENOUS | Status: AC
  Administered 2017-03-17: 0.4 mg via INTRAVENOUS

## 2017-03-17 MED ORDER — TECHNETIUM TC 99M TETROFOSMIN IV KIT
13.0000 | PACK | Freq: Once | INTRAVENOUS | Status: AC | PRN
  Administered 2017-03-17: 13.63 via INTRAVENOUS

## 2017-03-17 MED ORDER — TECHNETIUM TC 99M TETROFOSMIN IV KIT
32.4170 | PACK | Freq: Once | INTRAVENOUS | Status: AC | PRN
  Administered 2017-03-17: 32.417 via INTRAVENOUS

## 2017-03-19 ENCOUNTER — Ambulatory Visit (INDEPENDENT_AMBULATORY_CARE_PROVIDER_SITE_OTHER): Payer: Medicare Other

## 2017-03-19 ENCOUNTER — Other Ambulatory Visit: Payer: Self-pay

## 2017-03-19 ENCOUNTER — Telehealth: Payer: Self-pay | Admitting: Cardiovascular Disease

## 2017-03-19 DIAGNOSIS — R0602 Shortness of breath: Secondary | ICD-10-CM | POA: Diagnosis not present

## 2017-03-19 NOTE — Telephone Encounter (Signed)
PT returning call Please call [phone]

## 2017-03-20 NOTE — Telephone Encounter (Signed)
Reviewed nuc med test results with patient.

## 2017-03-24 DIAGNOSIS — I10 Essential (primary) hypertension: Secondary | ICD-10-CM | POA: Diagnosis not present

## 2017-03-24 DIAGNOSIS — N183 Chronic kidney disease, stage 3 (moderate): Secondary | ICD-10-CM | POA: Diagnosis not present

## 2017-03-24 DIAGNOSIS — R600 Localized edema: Secondary | ICD-10-CM | POA: Diagnosis not present

## 2017-03-30 ENCOUNTER — Other Ambulatory Visit: Payer: Self-pay | Admitting: Internal Medicine

## 2017-04-03 DIAGNOSIS — M9902 Segmental and somatic dysfunction of thoracic region: Secondary | ICD-10-CM | POA: Diagnosis not present

## 2017-04-03 DIAGNOSIS — M5136 Other intervertebral disc degeneration, lumbar region: Secondary | ICD-10-CM | POA: Diagnosis not present

## 2017-04-03 DIAGNOSIS — M9903 Segmental and somatic dysfunction of lumbar region: Secondary | ICD-10-CM | POA: Diagnosis not present

## 2017-04-03 DIAGNOSIS — M5431 Sciatica, right side: Secondary | ICD-10-CM | POA: Diagnosis not present

## 2017-04-11 ENCOUNTER — Other Ambulatory Visit: Payer: Self-pay | Admitting: Cardiovascular Disease

## 2017-04-14 ENCOUNTER — Other Ambulatory Visit: Payer: Self-pay

## 2017-04-14 DIAGNOSIS — N401 Enlarged prostate with lower urinary tract symptoms: Secondary | ICD-10-CM

## 2017-04-14 MED ORDER — TAMSULOSIN HCL 0.4 MG PO CAPS: 0.4000 mg | ORAL_CAPSULE | Freq: Every day | ORAL | 6 refills | Status: DC

## 2017-04-20 ENCOUNTER — Encounter: Payer: Self-pay | Admitting: Neurology

## 2017-04-20 ENCOUNTER — Other Ambulatory Visit: Payer: Self-pay | Admitting: Neurology

## 2017-04-20 DIAGNOSIS — H6122 Impacted cerumen, left ear: Secondary | ICD-10-CM | POA: Diagnosis not present

## 2017-04-20 DIAGNOSIS — H903 Sensorineural hearing loss, bilateral: Secondary | ICD-10-CM | POA: Diagnosis not present

## 2017-04-20 MED ORDER — CARBIDOPA-LEVODOPA 25-100 MG PO TABS: 1.5000 | ORAL_TABLET | Freq: Three times a day (TID) | ORAL | 1 refills | Status: DC

## 2017-05-04 ENCOUNTER — Other Ambulatory Visit: Payer: Self-pay | Admitting: Neurology

## 2017-05-04 MED ORDER — CLONAZEPAM 0.5 MG PO TABS: 0.2500 mg | ORAL_TABLET | Freq: Every day | ORAL | 0 refills | Status: DC

## 2017-05-08 DIAGNOSIS — M5136 Other intervertebral disc degeneration, lumbar region: Secondary | ICD-10-CM | POA: Diagnosis not present

## 2017-05-08 DIAGNOSIS — M9903 Segmental and somatic dysfunction of lumbar region: Secondary | ICD-10-CM | POA: Diagnosis not present

## 2017-05-08 DIAGNOSIS — M9902 Segmental and somatic dysfunction of thoracic region: Secondary | ICD-10-CM | POA: Diagnosis not present

## 2017-05-08 DIAGNOSIS — M5431 Sciatica, right side: Secondary | ICD-10-CM | POA: Diagnosis not present

## 2017-05-16 ENCOUNTER — Other Ambulatory Visit: Payer: Self-pay | Admitting: Cardiovascular Disease

## 2017-05-23 ENCOUNTER — Other Ambulatory Visit: Payer: Self-pay | Admitting: Cardiovascular Disease

## 2017-05-27 LAB — NM MYOCAR MULTI W/SPECT W/WALL MOTION / EF
CHL CUP RESTING HR STRESS: 86 {beats}/min
CHL CUP STRESS STAGE 3 GRADE: 0 %
CHL CUP STRESS STAGE 3 HR: 94 {beats}/min
CHL CUP STRESS STAGE 4 HR: 95 {beats}/min
CHL CUP STRESS STAGE 4 SPEED: 0 mph
CHL CUP STRESS STAGE 5 GRADE: 0 %
CHL CUP STRESS STAGE 5 SPEED: 0 mph
CSEPED: 0 min
CSEPEDS: 0 s
CSEPHR: 64 %
CSEPPMHR: 63 %
Estimated workload: 1 METS
LVDIAVOL: 94 mL (ref 62–150)
LVSYSVOL: 38 mL
MPHR: 148 {beats}/min
Peak HR: 94 {beats}/min
Stage 1 Grade: 0 %
Stage 1 HR: 85 {beats}/min
Stage 1 Speed: 0 mph
Stage 2 Grade: 0 %
Stage 2 HR: 85 {beats}/min
Stage 2 Speed: 0 mph
Stage 3 Speed: 0 mph
Stage 4 Grade: 0 %
Stage 5 DBP: 52 mmHg
Stage 5 HR: 88 {beats}/min
Stage 5 SBP: 163 mmHg
TID: 0.98

## 2017-06-12 DIAGNOSIS — J019 Acute sinusitis, unspecified: Secondary | ICD-10-CM | POA: Diagnosis not present

## 2017-06-12 DIAGNOSIS — R6889 Other general symptoms and signs: Secondary | ICD-10-CM | POA: Diagnosis not present

## 2017-06-12 DIAGNOSIS — B9689 Other specified bacterial agents as the cause of diseases classified elsewhere: Secondary | ICD-10-CM | POA: Diagnosis not present

## 2017-06-12 DIAGNOSIS — J101 Influenza due to other identified influenza virus with other respiratory manifestations: Secondary | ICD-10-CM | POA: Diagnosis not present

## 2017-06-18 ENCOUNTER — Ambulatory Visit: Payer: Medicare Other | Admitting: Neurology

## 2017-07-03 DIAGNOSIS — M9903 Segmental and somatic dysfunction of lumbar region: Secondary | ICD-10-CM | POA: Diagnosis not present

## 2017-07-03 DIAGNOSIS — M5431 Sciatica, right side: Secondary | ICD-10-CM | POA: Diagnosis not present

## 2017-07-03 DIAGNOSIS — M9902 Segmental and somatic dysfunction of thoracic region: Secondary | ICD-10-CM | POA: Diagnosis not present

## 2017-07-03 DIAGNOSIS — M5136 Other intervertebral disc degeneration, lumbar region: Secondary | ICD-10-CM | POA: Diagnosis not present

## 2017-07-06 ENCOUNTER — Other Ambulatory Visit: Payer: Self-pay | Admitting: Cardiovascular Disease

## 2017-07-14 ENCOUNTER — Other Ambulatory Visit: Payer: Self-pay | Admitting: Neurology

## 2017-07-31 DIAGNOSIS — M9903 Segmental and somatic dysfunction of lumbar region: Secondary | ICD-10-CM | POA: Diagnosis not present

## 2017-07-31 DIAGNOSIS — M5431 Sciatica, right side: Secondary | ICD-10-CM | POA: Diagnosis not present

## 2017-07-31 DIAGNOSIS — M5136 Other intervertebral disc degeneration, lumbar region: Secondary | ICD-10-CM | POA: Diagnosis not present

## 2017-07-31 DIAGNOSIS — M9902 Segmental and somatic dysfunction of thoracic region: Secondary | ICD-10-CM | POA: Diagnosis not present

## 2017-08-06 ENCOUNTER — Ambulatory Visit: Payer: Medicare Other

## 2017-08-11 ENCOUNTER — Ambulatory Visit (INDEPENDENT_AMBULATORY_CARE_PROVIDER_SITE_OTHER): Payer: Medicare Other

## 2017-08-11 VITALS — BP 110/64 | HR 51 | Temp 97.9°F | Resp 14 | Ht 66.0 in | Wt 190.4 lb

## 2017-08-11 DIAGNOSIS — Z0000 Encounter for general adult medical examination without abnormal findings: Secondary | ICD-10-CM | POA: Diagnosis not present

## 2017-08-11 NOTE — Patient Instructions (Addendum)
  Mr. Johnathan Arnold , Thank you for taking time to come for your Medicare Wellness Visit. I appreciate your ongoing commitment to your health goals. Please review the following plan we discussed and let me know if I can assist you in the future.   Follow up with Dr. Derrel Nip as needed.    Bring a copy of your [place] and/or Living Will to be scanned into chart.  Have a great day!  These are the goals we discussed: Goals    . Lose weight      Stay hydrated  Exercise Low sodium       This is a list of the screening recommended for you and due dates:  Health Maintenance  Topic Date Due  . Eye exam for diabetics  05/20/2017  . Complete foot exam   08/06/2017  . Hemoglobin A1C  08/06/2017  . Colon Cancer Screening  06/24/2019  . Tetanus Vaccine  12/18/2020  . Flu Shot  Completed  .  Hepatitis C: One time screening is recommended by Center for Disease Control  (CDC) for  adults born from [birth year] through 1966.   Completed  . Pneumonia vaccines  Completed

## 2017-08-11 NOTE — Progress Notes (Signed)
Subjective:   Johnathan Arnold. is a 72 y.o. male who presents for Medicare Annual/Subsequent preventive examination.  Review of Systems:  No ROS.  Medicare Wellness Visit. Additional risk factors are reflected in the social history.  Cardiac Risk Factors include: male gender;advanced age (>86men, >47 women);hypertension;diabetes mellitus     Objective:    Vitals: BP 110/64 (BP Location: Left Arm, Patient Position: Sitting, Cuff Size: Normal)   Pulse (!) 51   Temp 97.9 F (36.6 C) (Oral)   Resp 14   Ht 5\' 6"  (1.676 m)   Wt 190 lb 6.4 oz (86.4 kg)   SpO2 98%   BMI 30.73 kg/m   Body mass index is 30.73 kg/m.  Advanced Directives 08/11/2017 01/19/2017 08/05/2016 12/16/2014 10/11/2014  Does Patient Have a Medical Advance Directive? Yes Yes Yes Yes Yes  Type of Paramedic of [place];Living will [place];Living will [place];Living will [place] will  Does patient want to make changes to medical advance directive? No - Patient declined - No - Patient declined No - Patient declined No - Patient declined  Copy of [place] in Chart? No - copy requested - No - copy requested No - copy requested No - copy requested    Tobacco Social History   Tobacco Use  Smoking Status Never Smoker  Smokeless Tobacco Never Used     Counseling given: Not Answered   Clinical Intake:  Pre-visit preparation completed: Yes  Pain : No/denies pain     Nutritional Status: BMI > 30  Obese Diabetes: Yes(Followed  by PCP)  How often do you need to have someone help you when you read instructions, pamphlets, or other written materials from your doctor or pharmacy?: 1 - Never  Interpreter Needed?: No     Past Medical History:  Diagnosis Date  . 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 Diabetes mellitus without complication ([place])   . History of cardiac catheterization  2011   [facility]  . Hyperlipidemia   . Hypertension   . Hypertriglyceridemia   . Parkinson's disease ([place])   . 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 Vertigo    Past Surgical History:  Procedure Laterality Date  . CARDIAC CATHETERIZATION  05-19-2010   [facility]: Patent grafts. LIMA to LAD, SVG to D1, OM1 and RPDA  . CORONARY ARTERY BYPASS GRAFT  03/1998   5 vessel, [facility]   Family History  Problem Relation Age of Onset  . Heart attack Mother 59  . Hypertension Mother   . Heart attack Father 38  . Heart disease Father   . Heart disease Brother    Social History   Socioeconomic History  . Marital status: Married    Spouse name: None  . Number of children: None  . Years of education: None  . Highest education level: None  Social Needs  . Financial resource strain: Not hard at all  . Food insecurity - worry: Never true  . Food insecurity - inability: Never true  . Transportation needs - medical: No  . Transportation needs - non-medical: No  Occupational History  . Occupation: retired    Comment: IT work  Tobacco Use  . Smoking status: Never Smoker  . Smokeless tobacco: Never Used  Substance and Sexual Activity  . Alcohol use: Yes    Comment: occasional beer  . Drug use: No  . Sexual activity:  Not Currently  Other Topics Concern  . None  Social History Narrative  . None    Outpatient Encounter Medications as of 08/11/2017  Medication Sig  . aspirin 81 MG tablet Take by mouth. Takes 2 tablets daily.  . carbidopa-levodopa (SINEMET IR) 25-100 MG tablet TAKE 1 AND 1/2 TABLETS BY MOUTH THREE TIMES DAILY  . carvedilol (COREG) 6.25 MG tablet TAKE 1 TABLET BY MOUTH TWICE DAILY  . clobetasol cream (TEMOVATE) 9.32 % Apply 1 application topically once a week. Apply medication once a week  . clonazePAM (KLONOPIN) 0.5 MG tablet Take 0.5 tablets (0.25 mg total) by mouth at bedtime.  . diazepam (VALIUM) 5 MG tablet Take 1 tablet (5 mg total) by mouth every 8 (eight) hours as needed. For vertigo    . hydrochlorothiazide (HYDRODIURIL) 25 MG tablet Take 25 mg by mouth daily.   . isosorbide mononitrate (IMDUR) 60 MG 24 hr tablet TAKE 1 TABLET BY MOUTH EVERY DAY. ALSO TAKE 1 30 MG TABLET EVERY DAY (Patient taking differently: 1 daily)  . losartan (COZAAR) 25 MG tablet TAKE 1 TABLET BY MOUTH EVERY DAY  . Multiple Vitamin (MULTIVITAMIN) tablet Take 1 tablet by mouth daily.    . nitroGLYCERIN (NITROSTAT) 0.4 MG SL tablet Place 1 tablet (0.4 mg total) under the tongue every 5 (five) minutes as needed for chest pain.  . Omega-3 Fatty Acids (FISH OIL) 1200 MG CAPS Take by mouth daily.  Marland Kitchen omeprazole (PRILOSEC) 20 MG capsule TAKE 1 CAPSULE BY MOUTH EVERY MORNING AS DIRECTED  . polyethylene glycol (MIRALAX / GLYCOLAX) packet Take by mouth.  Marland Kitchen RANEXA 1000 MG SR tablet TAKE 1 TABLET(1000 MG) BY MOUTH TWICE DAILY  . simvastatin (ZOCOR) 20 MG tablet TAKE 1 TABLET BY MOUTH EVERY NIGHT AT BEDTIME  . tamsulosin (FLOMAX) 0.4 MG CAPS capsule Take 1 capsule (0.4 mg total) daily by mouth.  . zolpidem (AMBIEN) 10 MG tablet TAKE 1 TABLET BY MOUTH EVERY NIGHT AT BEDTIME AS NEEDED FOR SLEEP  . [DISCONTINUED] isosorbide mononitrate (IMDUR) 60 MG 24 hr tablet TAKE 1 TABLET BY MOUTH EVERY DAY. ALSO TAKE 1 30 MG TABLET EVERY DAY (Patient taking differently: TAKE 1 TABLET BY MOUTH EVERY DAY)  . [DISCONTINUED] omeprazole (PRILOSEC) 20 MG capsule TAKE 1 CAPSULE BY MOUTH EVERY MORNING AS DIRECTED   No facility-administered encounter medications on file as of 08/11/2017.     Activities of Daily Living In your present state of health, do you have any difficulty performing the following activities: 08/11/2017  Hearing? N  Vision? N  Difficulty concentrating or making decisions? N  Walking or climbing stairs? Y  Comment SOB and chest pressure on exertion.  Folllowed by Cardiology  Dressing or bathing? N  Doing errands, shopping? N  Preparing Food and eating ? N  Using the Toilet? N  In the past six months, have you  accidently leaked urine? N  Do you have problems with loss of bowel control? N  Managing your Medications? N  Managing your Finances? N  Housekeeping or managing your Housekeeping? N  Some recent data might be hidden    Patient Care Team: [name], MD as PCP - General (Internal Medicine) Isaias Cowman, MD (Internal Medicine)   Assessment:   This is a routine wellness examination for Johnathan Arnold.  The goal of the wellness visit is to assist the patient how to close the gaps in care and create a preventative care plan for the patient.   The roster of all physicians providing  medical care to patient is listed in the Snapshot section of the chart.  Osteoporosis risk reviewed.    Safety issues reviewed; Smoke and carbon monoxide detectors in the home. No firearms or firearms locked in a safe within the home. Wears seatbelts when driving or riding with others. No violence in the home.  They do not have excessive sun exposure.  Discussed the need for sun protection: hats, long sleeves and the use of sunscreen if there is significant sun exposure.  Patient is alert, normal appearance, oriented to person/place/and time.  Correctly identified the president of the [place] and recalls of 3/3 words. Performs simple calculations and can read correct time from watch face.  Displays appropriate judgement.  No new identified risk were noted.  No failures at ADL's or IADL's.    BMI- discussed the importance of a healthy diet, water intake and the benefits of aerobic exercise. Educational material provided.   24 hour diet recall: Regular diet  Dental- every 6 months.  Eye- Visual acuity not assessed per patient preference since they have regular follow up with the ophthalmologist.  Wears corrective lenses.  Next exam scheduled March 2018.   Sleep patterns- sleeps without issues.  Patient Concerns: None at this time. Follow up with PCP as needed.  Exercise Activities and Dietary  recommendations Current Exercise Habits: Structured exercise class, Type of exercise: calisthenics;walking, Time (Minutes): > 60, Frequency (Times/Week): 3, Weekly Exercise (Minutes/Week): 0, Intensity: Moderate  Goals    . Lose weight - up to 20lbs     Stay hydrated  Exercise Low sodium/low carb diet       Fall Risk Fall Risk  08/11/2017 02/12/2017 10/03/2016 08/05/2016 03/03/2015  Falls in the past year? No No No No No   Depression Screen PHQ 2/9 Scores 08/11/2017 08/05/2016 03/03/2015 10/07/2013  PHQ - 2 Score 0 0 0 0    Cognitive Function MMSE - Mini Mental State Exam 08/05/2016  Orientation to time 5  Orientation to Place 5  Registration 3  Attention/ Calculation 5  Recall 3  Language- name 2 objects 2  Language- repeat 1  Language- follow 3 step command 3  Language- read & follow direction 1  Write a sentence 1  Copy design 1  Total score 30     6CIT Screen 08/11/2017  What Year? 0 points  What month? 0 points  What time? 0 points  Count back from 20 0 points  Months in reverse 0 points    Immunization History  Administered Date(s) Administered  . Influenza Split 02/19/2011, 04/10/2014  . Influenza, High Dose Seasonal PF 04/01/2013, 02/28/2015  . Influenza,inj,Quad PF,6+ Mos 03/10/2016, 03/13/2017  . Influenza-Unspecified 03/09/2012  . Pneumococcal Conjugate-13 06/30/2013  . Pneumococcal Polysaccharide-23 05/21/2011, 08/06/2016  . Tdap 12/19/2010  . Zoster 05/09/2012    Screening Tests Health Maintenance  Topic Date Due  . OPHTHALMOLOGY EXAM  05/20/2017  . FOOT EXAM  08/06/2017  . HEMOGLOBIN A1C  08/06/2017  . COLONOSCOPY  06/24/2019  . TETANUS/TDAP  12/18/2020  . INFLUENZA VACCINE  Completed  . Hepatitis C Screening  Completed  . PNA vac Low Risk Adult  Completed      Plan:   End of life planning; Advance aging; Advanced directives discussed. Copy of current HCPOA/Living Will requested.    Diabetic follow up scheduled with PCP.  I have personally  reviewed and noted the following in the patient's chart:   . Medical and social history . Use of alcohol, tobacco or illicit drugs  .  Current medications and supplements . Functional ability and status . Nutritional status . Physical activity . Advanced directives . List of other physicians . Hospitalizations, surgeries, and ER visits in previous 12 months . Vitals . Screenings to include cognitive, depression, and falls . Referrals and appointments  In addition, I have reviewed and discussed with patient certain preventive protocols, quality metrics, and best practice recommendations. A written personalized care plan for preventive services as well as general preventive health recommendations were provided to patient.     Varney Biles, LPN  01/11/7840

## 2017-08-17 ENCOUNTER — Telehealth: Payer: Self-pay | Admitting: Family Medicine

## 2017-08-17 NOTE — Telephone Encounter (Signed)
Noted from prior question.

## 2017-08-17 NOTE — Telephone Encounter (Signed)
-----   Message from Dia Crawford, LPN sent at 7/61/6073  5:43 PM EDT ----- Spoke with patient and he confirms symptoms are not worsening. Symptoms are present when over exerting himself only and there is no change from baseline.  He does not wish to follow up with cardiology at this time.  He does understand the need for catheterization in such circumstances and will notify if symptoms worsen.    Thanks, Denisa ----- Message ----- From: Leone Haven, MD Sent: 08/15/2017   4:25 PM To: Denisa L O'Brien-Blaney, LPN  Please see my attestation. Thanks. Jonda Alanis.

## 2017-08-18 DIAGNOSIS — E119 Type 2 diabetes mellitus without complications: Secondary | ICD-10-CM | POA: Diagnosis not present

## 2017-08-20 ENCOUNTER — Encounter: Payer: Self-pay | Admitting: Neurology

## 2017-08-20 ENCOUNTER — Ambulatory Visit (INDEPENDENT_AMBULATORY_CARE_PROVIDER_SITE_OTHER): Payer: Medicare Other | Admitting: Neurology

## 2017-08-20 VITALS — BP 126/62 | HR 60 | Ht 66.0 in | Wt 188.0 lb

## 2017-08-20 DIAGNOSIS — G2581 Restless legs syndrome: Secondary | ICD-10-CM

## 2017-08-20 DIAGNOSIS — G20 Parkinson's disease: Secondary | ICD-10-CM | POA: Diagnosis not present

## 2017-08-20 NOTE — Progress Notes (Signed)
Johnathan Arnold. was seen today in the movement disorders clinic for neurologic consultation at the request of [name], MD.  This patient is accompanied in the office by his wife who supplements the history.  The consultation is for the evaluation of PD.  Pt currently under the care of Dr Manuella Ghazi and I have reviewed his records.  Began to see Dr. Manuella Ghazi in June, 2017.  His first symptom was right arm tremor.  By the time he began to see Dr. Manuella Ghazi in June, 2017 he had tremor in both hands.  He was started on carbidopa/levodopa 25/100, one tablet 3 times per day in June, 2017. Pt states that it did help tremor. He last saw Dr. Brigitte Pulse on 09/02/2016.  His levodopa was increased to 1-1/2 tablets 3 times per day (6-8am/1-2pm/6-7pm).  He cannot tell when the med wears off.   Specific Symptoms:  Tremor: Yes.  , mostly R hand at rest.   Family hx of similar:  No. (grandfather with tremor) Voice: maybe slight decrease Sleep: sleeps well  Vivid Dreams:  Yes.    Acting out dreams:  Yes.   (screams, has fallen out of bed) Wet Pillows: just a little Postural symptoms:  No.  Falls?  No. Bradykinesia symptoms: slight drag of the R leg Loss of smell: has never had great sense of smell Loss of taste:  No. Urinary Incontinence:  No. Difficulty Swallowing:  No. Handwriting, micrographia: Yes.   Trouble with ADL's:  No. (does brace self when putting on pants)  Trouble buttoning clothing: No. Depression:  No. Memory changes:  Some naming trouble (people) Hallucinations:  No.  visual distortions: No. N/V:  No. Lightheaded:  No. 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 Diplopia:  No. Dyskinesia:  No.  Patient had an MRI of the brain on 01/15/2015.  I had the opportunity to review this.  There was very mild small vessel disease.  Enhancing 11 x 13 mm cross-section soft tissue abnormality in the  RIGHT lateral mastoid. Cholesteatoma not excluded.   02/12/17 update:  Patient seen  today in follow-up for Parkinson's disease.  This patient is accompanied in the office by his spouse who supplements the history.   I asked him to hold his levodopa today, since I have never seen him off of levodopa.  He has been off of it for 24 hours.  He generally is on carbidopa/levodopa 25/100, 1.5 tablets 3 times per day.  I have reviewed records since our last visit.  He was in the emergency room on 01/19/2017 with complaints of shaking.  He felt that this was worse than his usual shaking and on the opposite side.  Workup was unremarkable in the emergency room.   Pt states that he was up in [place] with friends.  He felt like he was getting vertigo.  He was getting nauseated.  He took a valium for the vertigo.  He drove home.  He went to get out of the car and he was shaking on the L arm and leg.  Friends assisted him into the house.  Both legs felt weak but only the L was shaking and it only shook when he stood.  The shaking lasted minutes and seemed to go away when he laid down on the couch.  He went to sleep.  When he woke up he decided to get checked out.  He went to [facility] few weeks ago and sons asked  him to see another neurologist and they recommended neupro.  Pt didn't start that medication but has some samples.  One episode dizziness getting out of bed because got up too quickly.   He does c/o RLS.  That seems to come and go.  Wife notices him flailing around in sleep and its disturbing her sleep.    08/20/17 update: Patient was seen today as a work in.  He is accompanied by his spouse who supplements the history.  I have not seen him in about 6 months.  He states that he has been "playing with the carbidopa/levodopa 25/100" and was taking it around breakfast/lunch/dinner.  He is starting to take q 6 hours.  Thought that would improve tremor (wondered if overmedicating).  Noting some more tremor on the L side (not just the right).  Was previously on carbidopa/levodopa 25/100, 1-1/2 tablets 3 times  per day.  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  It helped but he d/c it as sx's went away.  He feels good still.  Pt denies falls.  Pt denies lightheadedness, near syncope.  No hallucinations.  Mood has been good.  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  Some word finding and memory trouble. Had nl EEG since last visit.  The records that were made available to me were reviewed.  Had flu in Jan.    PREVIOUS MEDICATIONS: Sinemet  ALLERGIES:  No Known Allergies  CURRENT MEDICATIONS:  Outpatient Encounter Medications as of 08/20/2017  Medication Sig  . aspirin 81 MG tablet Take by mouth. Takes 2 tablets daily.  . carbidopa-levodopa (SINEMET IR) 25-100 MG tablet TAKE 1 AND 1/2 TABLETS BY MOUTH THREE TIMES DAILY  . carvedilol (COREG) 6.25 MG tablet TAKE 1 TABLET BY MOUTH TWICE DAILY  . clobetasol cream (TEMOVATE) 7.42 % Apply 1 application topically once a week. Apply medication once a week  . clonazePAM (KLONOPIN) 0.5 MG tablet Take 0.5 tablets (0.25 mg total) by mouth at bedtime.  . hydrochlorothiazide (HYDRODIURIL) 25 MG tablet Take 25 mg by mouth daily.   . isosorbide mononitrate (IMDUR) 60 MG 24 hr tablet TAKE 1 TABLET BY MOUTH EVERY DAY. ALSO TAKE 1 30 MG TABLET EVERY DAY (Patient taking differently: 1 daily)  . losartan (COZAAR) 25 MG tablet TAKE 1 TABLET BY MOUTH EVERY DAY  . Multiple Vitamin (MULTIVITAMIN) tablet Take 1 tablet by mouth daily.    . nitroGLYCERIN (NITROSTAT) 0.4 MG SL tablet Place 1 tablet (0.4 mg total) under the tongue every 5 (five) minutes as needed for chest pain.  . Omega-3 Fatty Acids (FISH OIL) 1200 MG CAPS Take by mouth daily.  Marland Kitchen omeprazole (PRILOSEC) 20 MG capsule TAKE 1 CAPSULE BY MOUTH EVERY MORNING AS DIRECTED  . polyethylene glycol (MIRALAX / GLYCOLAX) packet Take by mouth.  Marland Kitchen RANEXA 1000 MG SR tablet TAKE 1 TABLET(1000 MG) BY MOUTH TWICE DAILY  . simvastatin (ZOCOR) 20 MG tablet TAKE 1 TABLET BY MOUTH EVERY NIGHT AT BEDTIME  . tamsulosin  (FLOMAX) 0.4 MG CAPS capsule Take 1 capsule (0.4 mg total) daily by mouth.  . zolpidem (AMBIEN) 10 MG tablet TAKE 1 TABLET BY MOUTH EVERY NIGHT AT BEDTIME AS NEEDED FOR SLEEP  . [DISCONTINUED] diazepam (VALIUM) 5 MG tablet Take 1 tablet (5 mg total) by mouth every 8 (eight) hours as needed. For vertigo   No facility-administered encounter medications on file as of 08/20/2017.     PAST MEDICAL HISTORY:   Past Medical History:  Diagnosis Date  . 3-vessel  coronary artery disease    s/p  5 vessel CABG  . Diabetes mellitus without complication ([place])   . History of cardiac catheterization 2011   [facility]  . Hyperlipidemia   . Hypertension   . Hypertriglyceridemia   . Parkinson's disease ([place])   . 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 Vertigo     PAST SURGICAL HISTORY:   Past Surgical History:  Procedure Laterality Date  . CARDIAC CATHETERIZATION  05-19-2010   [facility]: Patent grafts. LIMA to LAD, SVG to D1, OM1 and RPDA  . CORONARY ARTERY BYPASS GRAFT  03/1998   5 vessel, [place]    SOCIAL HISTORY:   Social History   Socioeconomic History  . Marital status: Married    Spouse name: Not on file  . Number of children: Not on file  . Years of education: Not on file  . Highest education level: Not on file  Social Needs  . Financial resource strain: Not hard at all  . Food insecurity - worry: Never true  . Food insecurity - inability: Never true  . Transportation needs - medical: No  . Transportation needs - non-medical: No  Occupational History  . Occupation: retired    Comment: IT work  Tobacco Use  . Smoking status: Never Smoker  . Smokeless tobacco: Never Used  Substance and Sexual Activity  . Alcohol use: Yes    Comment: occasional beer  . Drug use: No  . Sexual activity: Not Currently  Other Topics Concern  . Not on file  Social History Narrative  . Not on file    FAMILY HISTORY:   Family Status  Relation Name Status  . Mother  Deceased  . Father  Deceased  . Brother   Alive  . Son x2 Alive    ROS: A complete 10 system review of systems was obtained and was unremarkable apart from what is mentioned above.  PHYSICAL EXAMINATION:    VITALS:   Vitals:   08/20/17 1319  BP: 126/62  Pulse: 60  SpO2: 98%  Weight: 188 lb (85.3 kg)  Height: 5\' 6"  (1.676 m)     No data found.   GEN:  The patient appears stated age and is in NAD. HEENT:  Normocephalic, atraumatic.  The mucous membranes are moist. The superficial temporal arteries are without ropiness or tenderness. CV:  Bradycardic.  Regular rhythm. Lungs:  CTAB Neck/HEME:  There are no carotid bruits bilaterally.  Neurological examination:  Orientation: The patient is alert and oriented x3.  Cranial nerves: There is good facial symmetry. Pupils are equal round and reactive to light bilaterally. Fundoscopic exam reveals clear margins bilaterally. Extraocular muscles are intact. The visual fields are full to confrontational testing. The speech is fluent and clear. Soft palate rises symmetrically and there is no tongue deviation. Hearing is intact to conversational tone. Sensation: Sensation is intact to light touch throughout Motor: Strength is 5/5 in the bilateral upper and lower extremities.   Shoulder shrug is equal and symmetric.  There is no pronator drift.   Movement examination: Tone: There is normal tone in the bilateral upper extremities.  The tone in the lower extremities is normal.  Abnormal movements: There is a rare right upper extremity resting tremor. Coordination:  There is no decremation with RAM's, with any form of RAMS, including alternating supination and pronation of the forearm, hand opening and closing, finger taps, heel taps and toe taps. Gait and Station: The patient has no difficulty arising out of a  deep-seated chair without the use of the hands. The patient's stride length is normal with somewhat purposeful arm swing (same as previous).      Lab Results  Component Value  Date   WBC 5.1 01/19/2017   HGB 12.4 (L) 01/19/2017   HCT 35.5 (L) 01/19/2017   MCV 97.6 01/19/2017   PLT 209 01/19/2017   Lab Results  Component Value Date   [id number] 08/06/2015     ASSESSMENT/PLAN:  1.  Idiopathic Parkinson's disease, tremor predominant.  Dx: 11/2015  -We discussed that it used to be thought that levodopa would increase risk of melanoma but now it is believed that Parkinsons itself likely increases risk of melanoma. he is to get regular skin checks.  -He had been off of medication for 24 hours when I saw him previously.  While he certainly has a parkinsonian tremor, he still does not meet formal criteria for Parkinson's disease.  He and I discussed this again today.  However, he feels that he physically does better on levodopa.  We will continue carbidopa/levodopa 25/100, 1 tablet 3 times per day.  Discussed timing of medication.   -He remains in rock study boxing and I encouraged him to continue that.  -We discussed community resources in the area including patient support groups and community exercise programs for PD and pt education was provided to the patient.    -Had a second opinion in [place] and was given Neupro patches.  Patient did not take them.  While this is not a bad drug, ultimately, this would require him to try a different agonist first as Neupro was not considered first line generally by Medicare because of cost.  In addition, I am not sure that adds much right now and the patient agreed.  -invited to PD symposium  2.  Hx of HTN  -PD is really one true cure for HTN.  On several BP lowering agents and BP low in office today.  If able, may want to consider lowering meds.  3.  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  Pt states known fx years ago.  Will monitor clinically  4.  RLS with plmd  -Clonazepam was very helpful, but he went off of it after about 30 days because the symptoms went away and have not  returned.  5.  Episode of L sided shaking  -EEG in 02/2017 was normal.  No further episodes.  Doubt seizure.  6.  Follow up is anticipated in the next few months, sooner should new neurologic issues arise.  Cc:  [name], MD

## 2017-08-20 NOTE — Patient Instructions (Signed)
  [facility]  The [facility] team know that living well with a movement disorder extends far beyond our clinic walls. We are together with you. Our team is passionate about providing resources to you and your loved ones who are living with Parkinson's disease and movement disorders. Participate in these programs and join our community. These resources are free or low cost!   [facility] is adding:   Innovative educational programs for patients and caregivers.   Support groups for patients and caregivers living with Parkinson's disease.   Parkinson's specific exercise programs.   Custom tailored therapeutic programs that will benefit patient's living with Parkinson's disease.   We are in this together. You can help and contribute to grow these programs and resources in our community. 100% of the funds donated to the Movement Disorders Fund stays right here in our community to support patients and their caregivers.  To make a tax deductible contribution:  -ask for a [facility]'s envelope in the office today.  - call the Office of Institutional Advancement at [phone].         

## 2017-08-21 LAB — HM DIABETES EYE EXAM

## 2017-08-28 DIAGNOSIS — M9903 Segmental and somatic dysfunction of lumbar region: Secondary | ICD-10-CM | POA: Diagnosis not present

## 2017-08-28 DIAGNOSIS — M5136 Other intervertebral disc degeneration, lumbar region: Secondary | ICD-10-CM | POA: Diagnosis not present

## 2017-08-28 DIAGNOSIS — M5431 Sciatica, right side: Secondary | ICD-10-CM | POA: Diagnosis not present

## 2017-08-28 DIAGNOSIS — M9902 Segmental and somatic dysfunction of thoracic region: Secondary | ICD-10-CM | POA: Diagnosis not present

## 2017-09-11 ENCOUNTER — Ambulatory Visit: Payer: Medicare Other | Admitting: Neurology

## 2017-09-21 ENCOUNTER — Ambulatory Visit (INDEPENDENT_AMBULATORY_CARE_PROVIDER_SITE_OTHER): Payer: Medicare Other | Admitting: Internal Medicine

## 2017-09-21 ENCOUNTER — Encounter: Payer: Self-pay | Admitting: Internal Medicine

## 2017-09-21 VITALS — BP 140/62 | HR 50 | Temp 97.7°F | Resp 15 | Ht 66.0 in | Wt 189.0 lb

## 2017-09-21 DIAGNOSIS — N183 Chronic kidney disease, stage 3 unspecified: Secondary | ICD-10-CM | POA: Insufficient documentation

## 2017-09-21 DIAGNOSIS — N1831 Chronic kidney disease, stage 3a: Secondary | ICD-10-CM | POA: Insufficient documentation

## 2017-09-21 DIAGNOSIS — I10 Essential (primary) hypertension: Secondary | ICD-10-CM

## 2017-09-21 DIAGNOSIS — E782 Mixed hyperlipidemia: Secondary | ICD-10-CM | POA: Diagnosis not present

## 2017-09-21 DIAGNOSIS — E1121 Type 2 diabetes mellitus with diabetic nephropathy: Secondary | ICD-10-CM

## 2017-09-21 LAB — COMPREHENSIVE METABOLIC PANEL
ALT: 6 U/L (ref 0–53)
AST: 16 U/L (ref 0–37)
Albumin: 4.4 g/dL (ref 3.5–5.2)
Alkaline Phosphatase: 40 U/L (ref 39–117)
BUN: 19 mg/dL (ref 6–23)
CO2: 27 mEq/L (ref 19–32)
Calcium: 9.4 mg/dL (ref 8.4–10.5)
Chloride: 102 mEq/L (ref 96–112)
Creatinine, Ser: 1.73 mg/dL — ABNORMAL HIGH (ref 0.40–1.50)
GFR: 41.39 mL/min — AB (ref >60.00)
GLUCOSE: 114 mg/dL — AB (ref 70–99)
POTASSIUM: 4.4 meq/L (ref 3.5–5.1)
Sodium: 137 mEq/L (ref 135–145)
Total Bilirubin: 0.7 mg/dL (ref 0.2–1.2)
Total Protein: 6.7 g/dL (ref 6.0–8.3)

## 2017-09-21 LAB — LIPID PANEL
CHOL/HDL RATIO: 2
Cholesterol: 118 mg/dL (ref 0–200)
HDL: 62.3 mg/dL (ref >39.00)
LDL Cholesterol: 38 mg/dL (ref 0–99)
NONHDL: 55.39
Triglycerides: 89 mg/dL (ref 0.0–149.0)
VLDL: 17.8 mg/dL (ref 0.0–40.0)

## 2017-09-21 LAB — HEMOGLOBIN A1C: HEMOGLOBIN A1C: 5.8 % (ref 4.6–6.5)

## 2017-09-21 LAB — MICROALBUMIN / CREATININE URINE RATIO
Creatinine,U: 109.1 mg/dL
MICROALB UR: 1.6 mg/dL (ref 0.0–1.9)
Microalb Creat Ratio: 1.4 mg/g (ref 0.0–30.0)

## 2017-09-21 MED ORDER — OMEPRAZOLE 20 MG PO CPDR: DELAYED_RELEASE_CAPSULE | ORAL | 1 refills | Status: DC

## 2017-09-21 MED ORDER — ZOSTER VAC RECOMB ADJUVANTED 50 MCG/0.5ML IM SUSR: 0.5000 mL | Freq: Once | INTRAMUSCULAR | 1 refills | Status: AC

## 2017-09-21 NOTE — Assessment & Plan Note (Addendum)
Secondary to atherosclerotic disease. Decline in GFR occurred initially in 2016,  With another decline 8 months ago which has not resolved or progressed.  He has semi annual follow up with [facility], last visit Oct 2018. . BP control is at goal and he is on the appropriate medications.  Lab Results  Component Value Date   CREATININE 1.73 (H) 09/21/2017   Lab Results  Component Value Date   CALCIUM 9.4 09/21/2017

## 2017-09-21 NOTE — Assessment & Plan Note (Signed)
Well controlled on current regimen. Renal function stable, no changes today.  Lab Results  Component Value Date   CREATININE 1.73 (H) 09/21/2017   Lab Results  Component Value Date   NA 137 09/21/2017   K 4.4 09/21/2017   CL 102 09/21/2017   CO2 27 09/21/2017

## 2017-09-21 NOTE — Progress Notes (Signed)
Subjective:  Patient ID: Johnathan Arnold., male    DOB: 1946-05-10  Age: 72 y.o. MRN: [id number]  CC: The primary encounter diagnosis was Mixed hyperlipidemia. 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  HPI Johnathan Arnold. presents for 6 month follow up on diabetes.  Patient has no complaints today.  Patient is following a low glycemic index diet and taking all prescribed medications regularly without side effects.  Fasting sugars have been under less than 140 most of the time and post prandials have been under 160 except on rare occasions. Patient is exercising about 3 times per week and not intentionally trying to lose weight .  Patient has had an eye exam in the la12 st month and checks feet regularly for signs of infection.  Patient does not walk barefoot outside,  And denies an numbness tingling or burning in feet. Patient is up to date on all recommended vaccinations  He is frustrated at his inability to exercise without developing leg pain . He has had an arterial evaluation that ruled out peripheral vascular disease. He underwent a treadmill stress test that was converted to a Lexiscan due to an inability to raise his heart rate adequately.   A small reversible defect was noted  And a normal EF was noted.   Lab Results  Component Value Date   HGBA1C 5.8 09/21/2017   Lab Results  Component Value Date   MICROALBUR 1.6 09/21/2017      Outpatient Medications Prior to Visit  Medication Sig Dispense Refill  . aspirin 81 MG tablet Take by mouth. Takes 2 tablets daily.    . carbidopa-levodopa (SINEMET IR) 25-100 MG tablet TAKE 1 AND 1/2 TABLETS BY MOUTH THREE TIMES DAILY (Patient taking differently: TAKE 1 TABLET BY MOUTH THREE TIMES DAILY) 405 tablet 0  . carvedilol (COREG) 6.25 MG tablet TAKE 1 TABLET BY MOUTH  TWICE DAILY 180 tablet 2  . clobetasol cream (TEMOVATE) 0.86 % Apply 1 application topically once a week. Apply medication once a week 30 g 2  . clonazePAM (KLONOPIN) 0.5 MG tablet Take 0.5 tablets (0.25 mg total) by mouth at bedtime. 45 tablet 0  . hydrochlorothiazide (HYDRODIURIL) 25 MG tablet Take 25 mg by mouth daily.     . isosorbide mononitrate (IMDUR) 60 MG 24 hr tablet TAKE 1 TABLET BY MOUTH EVERY DAY. ALSO TAKE 1 30 MG TABLET EVERY DAY (Patient taking differently: 1 daily) 90 tablet 3  . losartan (COZAAR) 25 MG tablet TAKE 1 TABLET BY MOUTH EVERY DAY 90 tablet 1  . Multiple Vitamin (MULTIVITAMIN) tablet Take 1 tablet by mouth daily.      . nitroGLYCERIN (NITROSTAT) 0.4 MG SL tablet Place 1 tablet (0.4 mg total) under the tongue every 5 (five) minutes as needed for chest pain. 25 tablet 1  . Omega-3 Fatty Acids (FISH OIL) 1200 MG CAPS Take by mouth daily.    . polyethylene glycol (MIRALAX / GLYCOLAX) packet Take by mouth.    Marland Kitchen RANEXA 1000 MG SR tablet TAKE 1 TABLET(1000 MG) BY MOUTH TWICE DAILY 60 tablet 6  . simvastatin (ZOCOR) 20 MG tablet TAKE 1 TABLET BY MOUTH EVERY NIGHT AT BEDTIME 90 tablet 3  . tamsulosin (FLOMAX) 0.4 MG CAPS capsule Take 1 capsule (0.4 mg total) daily by mouth. 30 capsule 6  . zolpidem (AMBIEN) 10 MG tablet  TAKE 1 TABLET BY MOUTH EVERY NIGHT AT BEDTIME AS NEEDED FOR SLEEP 30 tablet 0  . omeprazole (PRILOSEC) 20 MG capsule TAKE 1 CAPSULE BY MOUTH EVERY MORNING AS DIRECTED 90 capsule 1   No facility-administered medications prior to visit.     Review of Systems;  Patient denies headache, fevers, malaise, unintentional weight loss, skin rash, eye pain, sinus congestion and sinus pain, sore throat, dysphagia,  hemoptysis , cough, dyspnea, wheezing, chest pain, palpitations, orthopnea, edema, abdominal pain, nausea, melena, diarrhea, constipation, flank pain, dysuria, hematuria, urinary  Frequency, nocturia, numbness, tingling, seizures,  Focal weakness, Loss of  consciousness,  Tremor, insomnia, depression, anxiety, and suicidal ideation.      Objective:  BP 140/62 (BP Location: Left Arm, Patient Position: Sitting, Cuff Size: Normal)   Pulse (!) 50   Temp 97.7 F (36.5 C) (Oral)   Resp 15   Ht 5\' 6"  (1.676 m)   Wt 189 lb (85.7 kg)   SpO2 98%   BMI 30.51 kg/m   BP Readings from Last 3 Encounters:  09/21/17 140/62  08/20/17 126/62  08/11/17 110/64    Wt Readings from Last 3 Encounters:  09/21/17 189 lb (85.7 kg)  08/20/17 188 lb (85.3 kg)  08/11/17 190 lb 6.4 oz (86.4 kg)    General appearance: alert, cooperative and appears stated age Ears: normal TM's and external ear canals both ears Throat: lips, mucosa, and tongue normal; teeth and gums normal Neck: no adenopathy, no carotid bruit, supple, symmetrical, trachea midline and thyroid not enlarged, symmetric, no tenderness/mass/nodules Back: symmetric, no curvature. ROM normal. No CVA tenderness. Lungs: clear to auscultation bilaterally Heart: regular rate and rhythm, S1, S2 normal, no murmur, click, rub or gallop Abdomen: soft, non-tender; bowel sounds normal; no masses,  no organomegaly Pulses: 2+ and symmetric Skin: Skin color, texture, turgor normal. No rashes or lesions Lymph nodes: Cervical, supraclavicular, and axillary nodes normal.  Lab Results  Component Value Date   HGBA1C 5.8 09/21/2017   HGBA1C 6.7 (H) 02/04/2017   HGBA1C 5.6 08/06/2016    Lab Results  Component Value Date   CREATININE 1.73 (H) 09/21/2017   CREATININE 1.76 (H) 02/20/2017   CREATININE 1.57 (H) 02/04/2017    Lab Results  Component Value Date   WBC 5.1 01/19/2017   HGB 12.4 (L) 01/19/2017   HCT 35.5 (L) 01/19/2017   PLT 209 01/19/2017   GLUCOSE 114 (H) 09/21/2017   CHOL 118 09/21/2017   TRIG 89.0 09/21/2017   HDL 62.30 09/21/2017   LDLDIRECT 38.0 08/06/2015   LDLCALC 38 09/21/2017   ALT 6 09/21/2017   AST 16 09/21/2017   NA 137 09/21/2017   K 4.4 09/21/2017   CL 102 09/21/2017    CREATININE 1.73 (H) 09/21/2017   BUN 19 09/21/2017   CO2 27 09/21/2017   TSH 2.61 08/06/2016   PSA 1.08 08/01/2014   HGBA1C 5.8 09/21/2017   MICROALBUR 1.6 09/21/2017    Nm Myocar Multi W/spect W/wall Motion / Ef  Result Date: 05/27/2017  There is a small in size, mild in severity, reversible defect involving the mid anteroseptal segment consistent with mild ischemia.  There is a small in size, severe, fixed defect at the apex that most likely represents artifact (attenuation and apical thinning) and less likely scar.  The left ventricular ejection fraction is normal (60%).  Patient was unable to reach target heart rate with exercise; the study was converted to [place].  Upsloping ST segment depression ST segment depression of 1 mm  was noted during stress in the I, II, V5 and V6 leads after administration of Lexiscan.     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  Continue ASA, ARB, and statin   Lab Results  Component Value Date   HGBA1C 5.8 09/21/2017   Lab Results  Component Value Date   MICROALBUR 1.6 09/21/2017  '      Relevant Orders   Microalbumin / creatinine urine ratio (Completed)   Hemoglobin A1c (Completed)   Comprehensive metabolic panel (Completed)   Hypertension    Well controlled on current regimen. Renal function stable, no changes today.  Lab Results  Component Value Date   CREATININE 1.73 (H) 09/21/2017   Lab Results  Component Value Date   NA 137 09/21/2017   K 4.4 09/21/2017   CL 102 09/21/2017   CO2 27 09/21/2017         CKD (chronic kidney disease) stage 3, GFR 30-59 ml/min (HCC)    Secondary to atherosclerotic disease. Decline in GFR occurred initially in 2016,  With another decline 8 months ago which has not resolved or progressed.  He  has semi annual follow up with [facility], last visit Oct 2018. . BP control is at goal and he is on the appropriate medications.  Lab Results  Component Value Date   CREATININE 1.73 (H) 09/21/2017   Lab Results  Component Value Date   CALCIUM 9.4 09/21/2017          A total of 25 minutes of face to face time was spent with patient more than half of which was spent in counselling about the above mentioned conditions  and coordination of care   I am having Johnathan Arnold. start on Zoster Vaccine Adjuvanted. I am also having him maintain his multivitamin, aspirin, Fish Oil, isosorbide mononitrate, zolpidem, simvastatin, polyethylene glycol, hydrochlorothiazide, nitroGLYCERIN, clobetasol cream, losartan, tamsulosin, clonazePAM, RANEXA, carvedilol, carbidopa-levodopa, and omeprazole.  Meds ordered this encounter  Medications  . omeprazole (PRILOSEC) 20 MG capsule    Sig: TAKE 1 CAPSULE BY MOUTH EVERY MORNING AS DIRECTED    Dispense:  90 capsule    Refill:  1  . Zoster Vaccine Adjuvanted [facility]) injection    Sig: Inject 0.5 mLs into the muscle once for 1 dose.    Dispense:  1 each    Refill:  1    Medications Discontinued During This Encounter  Medication Reason  . omeprazole (PRILOSEC) 20 MG capsule Reorder    Follow-up: Return in about 6 months (around 03/23/2018), or CPE, for follow up diabetes.   [name], MD

## 2017-09-21 NOTE — Patient Instructions (Signed)
I encourage you to discuss your last ECHO with Dr Fletcher Anon  I did not see any abnormalities that would limit your ability to exercise    The ShingRx vaccine is now available in local pharmacies and is much more protective thant Zostavaxs,  It is therefore ADVISED for all interested adults over 50 to prevent shingles

## 2017-09-29 ENCOUNTER — Other Ambulatory Visit: Payer: Self-pay | Admitting: Internal Medicine

## 2017-10-02 DIAGNOSIS — M5431 Sciatica, right side: Secondary | ICD-10-CM | POA: Diagnosis not present

## 2017-10-02 DIAGNOSIS — M9903 Segmental and somatic dysfunction of lumbar region: Secondary | ICD-10-CM | POA: Diagnosis not present

## 2017-10-02 DIAGNOSIS — M5136 Other intervertebral disc degeneration, lumbar region: Secondary | ICD-10-CM | POA: Diagnosis not present

## 2017-10-02 DIAGNOSIS — M9902 Segmental and somatic dysfunction of thoracic region: Secondary | ICD-10-CM | POA: Diagnosis not present

## 2017-10-13 ENCOUNTER — Other Ambulatory Visit: Payer: Self-pay | Admitting: Cardiovascular Disease

## 2017-10-30 DIAGNOSIS — M9902 Segmental and somatic dysfunction of thoracic region: Secondary | ICD-10-CM | POA: Diagnosis not present

## 2017-10-30 DIAGNOSIS — M5136 Other intervertebral disc degeneration, lumbar region: Secondary | ICD-10-CM | POA: Diagnosis not present

## 2017-10-30 DIAGNOSIS — M9903 Segmental and somatic dysfunction of lumbar region: Secondary | ICD-10-CM | POA: Diagnosis not present

## 2017-10-30 DIAGNOSIS — M5431 Sciatica, right side: Secondary | ICD-10-CM | POA: Diagnosis not present

## 2017-11-25 ENCOUNTER — Other Ambulatory Visit: Payer: Self-pay | Admitting: Family Medicine

## 2017-11-25 DIAGNOSIS — N401 Enlarged prostate with lower urinary tract symptoms: Secondary | ICD-10-CM

## 2017-11-25 MED ORDER — TAMSULOSIN HCL 0.4 MG PO CAPS: 0.4000 mg | ORAL_CAPSULE | Freq: Every day | ORAL | 6 refills | Status: DC

## 2017-12-02 DIAGNOSIS — D485 Neoplasm of uncertain behavior of skin: Secondary | ICD-10-CM | POA: Diagnosis not present

## 2017-12-02 DIAGNOSIS — X32XXXA Exposure to sunlight, initial encounter: Secondary | ICD-10-CM | POA: Diagnosis not present

## 2017-12-02 DIAGNOSIS — L821 Other seborrheic keratosis: Secondary | ICD-10-CM | POA: Diagnosis not present

## 2017-12-02 DIAGNOSIS — L570 Actinic keratosis: Secondary | ICD-10-CM | POA: Diagnosis not present

## 2017-12-02 DIAGNOSIS — C44519 Basal cell carcinoma of skin of other part of trunk: Secondary | ICD-10-CM | POA: Diagnosis not present

## 2017-12-03 DIAGNOSIS — R600 Localized edema: Secondary | ICD-10-CM | POA: Diagnosis not present

## 2017-12-03 DIAGNOSIS — I10 Essential (primary) hypertension: Secondary | ICD-10-CM | POA: Diagnosis not present

## 2017-12-03 DIAGNOSIS — N183 Chronic kidney disease, stage 3 (moderate): Secondary | ICD-10-CM | POA: Diagnosis not present

## 2017-12-23 ENCOUNTER — Other Ambulatory Visit: Payer: Self-pay | Admitting: Cardiovascular Disease

## 2017-12-24 ENCOUNTER — Other Ambulatory Visit: Payer: Self-pay | Admitting: Cardiovascular Disease

## 2017-12-24 DIAGNOSIS — H02882 Meibomian gland dysfunction right lower eyelid: Secondary | ICD-10-CM | POA: Diagnosis not present

## 2017-12-25 DIAGNOSIS — H903 Sensorineural hearing loss, bilateral: Secondary | ICD-10-CM | POA: Diagnosis not present

## 2018-01-13 ENCOUNTER — Other Ambulatory Visit: Payer: Self-pay | Admitting: Cardiovascular Disease

## 2018-01-13 ENCOUNTER — Other Ambulatory Visit: Payer: Self-pay | Admitting: Internal Medicine

## 2018-01-14 ENCOUNTER — Encounter

## 2018-01-14 ENCOUNTER — Encounter: Payer: Self-pay | Admitting: Cardiovascular Disease

## 2018-01-14 ENCOUNTER — Ambulatory Visit (INDEPENDENT_AMBULATORY_CARE_PROVIDER_SITE_OTHER): Payer: Medicare Other | Admitting: Cardiovascular Disease

## 2018-01-14 VITALS — BP 112/54 | HR 52 | Ht 66.0 in | Wt 189.8 lb

## 2018-01-14 DIAGNOSIS — Z01818 Encounter for other preprocedural examination: Secondary | ICD-10-CM

## 2018-01-14 DIAGNOSIS — I10 Essential (primary) hypertension: Secondary | ICD-10-CM

## 2018-01-14 DIAGNOSIS — R0602 Shortness of breath: Secondary | ICD-10-CM | POA: Diagnosis not present

## 2018-01-14 DIAGNOSIS — I25118 Atherosclerotic heart disease of native coronary artery with other forms of angina pectoris: Secondary | ICD-10-CM

## 2018-01-14 DIAGNOSIS — E785 Hyperlipidemia, unspecified: Secondary | ICD-10-CM

## 2018-01-14 NOTE — Progress Notes (Signed)
Cardiology Office Note   Date:  01/14/2018   ID:  Johnathan Arnold., DOB 1945-07-09, MRN [id number]  PCP:  [name], MD  Cardiologist:   [name], MD   Chief Complaint  Patient presents with  . other    6 mo follow up.medications verbally reviewed with pt.       History of Present Illness: Knox [name]. is a 72 y.o. male who presents for a followup visit.  He has known history of coronary artery disease status post CABG in 1999. He also has known history of hypertension, chronic kidney disease, hyperlipidemia and type 2 diabetes. He underwent a cardiac catheterization in 2011 which showed patent grafts. He continued to have mild exertional chest pain. A treadmill nuclear stress test in 06/2012 showed normal ejection fraction. Fixed inferior wall defect with normal wall motion and moderate reversibility in the septal wall.  Previous carotid Doppler showed mild nonobstructive bilateral disease.  He had worsening exertional dyspnea last year.  He underwent a treadmill Myoview and had mild 1 mm ST depression in the inferior and anterolateral leads with inability to reach target heart rate.  He was switched to [employer].  Perfusion showed mild anteroseptal ischemia with normal ejection fraction.  Echocardiogram showed an EF of 60 to 65% with normal diastolic function, mild mitral regurgitation and mild pulmonary hypertension with peak systolic pressure of 45 mmHg.  He reports no improvement in dyspnea.  If anything, he describes gradual worsening and decline over the last year with significant exertional dyspnea and fatigue that has limited his ability to exercise.  No chest pain.  In addition to that, he continues to complain of thigh pain with activities.  His vascular exam showed normal pulses.  He underwent lower extremity arterial Doppler last year that showed normal ABI and waveforms.  There was no evidence of peripheral arterial disease.    Past Medical History:    Diagnosis Date  . 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 Diabetes mellitus without complication ([place])   . History of cardiac catheterization 2011   [facility]  . Hyperlipidemia   . Hypertension   . Hypertriglyceridemia   . Parkinson's disease ([place])   . 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 Vertigo     Past Surgical History:  Procedure Laterality Date  . CARDIAC CATHETERIZATION  05-19-2010   [facility]: Patent grafts. LIMA to LAD, SVG to D1, OM1 and RPDA  . CORONARY ARTERY BYPASS GRAFT  03/1998   5 vessel, [facility]     Current Outpatient Medications  Medication Sig Dispense Refill  . aspirin 81 MG tablet Take by mouth. Takes 2 tablets daily.    . carbidopa-levodopa (SINEMET IR) 25-100 MG tablet TAKE 1 AND 1/2 TABLETS BY MOUTH THREE TIMES DAILY (Patient taking differently: Take 1 tablet by mouth 3 (three) times daily. ) 405 tablet 0  . carvedilol (COREG) 6.25 MG tablet TAKE 1 TABLET BY MOUTH TWICE DAILY 180 tablet 2  . clobetasol cream (TEMOVATE) 3.76 % Apply 1 application topically once a week. Apply medication once a week 30 g 2  . clonazePAM (KLONOPIN) 0.5 MG tablet Take 0.5 tablets (0.25 mg total) by mouth at bedtime. 45 tablet 0  . hydrochlorothiazide (HYDRODIURIL) 25 MG tablet Take 25 mg by mouth daily.     . isosorbide mononitrate (IMDUR) 60 MG 24 hr tablet TAKE 1 TABLET BY MOUTH DAILY 90 tablet 0  . losartan (COZAAR)  25 MG tablet TAKE 1 TABLET BY MOUTH EVERY DAY 90 tablet 1  . Multiple Vitamin (MULTIVITAMIN) tablet Take 1 tablet by mouth daily.      . nitroGLYCERIN (NITROSTAT) 0.4 MG SL tablet Place 1 tablet (0.4 mg total) under the tongue every 5 (five) minutes as needed for chest pain. 25 tablet 1  . Omega-3 Fatty Acids (FISH OIL) 1200 MG CAPS Take by mouth daily.    Marland Kitchen omeprazole (PRILOSEC) 20 MG capsule TAKE 1 CAPSULE BY MOUTH EVERY MORNING AS DIRECTED 90 capsule 1  . polyethylene glycol (MIRALAX / GLYCOLAX) packet Take by mouth.    . ranolazine (RANEXA) 1000 MG SR tablet  TAKE 1 TABLET(1000 MG) BY MOUTH TWICE DAILY 60 tablet 0  . simvastatin (ZOCOR) 20 MG tablet TAKE 1 TABLET BY MOUTH EVERY NIGHT AT BEDTIME 90 tablet 0  . tamsulosin (FLOMAX) 0.4 MG CAPS capsule Take 1 capsule (0.4 mg total) by mouth daily. 30 capsule 6  . zolpidem (AMBIEN) 10 MG tablet TAKE 1 TABLET BY MOUTH EVERY NIGHT AT BEDTIME AS NEEDED FOR SLEEP 30 tablet 0  . PREVIDENT 5000 BOOSTER PLUS 1.1 % PSTE U UTD  0   No current facility-administered medications for this visit.     Allergies:   Patient has no known allergies.    Social History:  The patient  reports that he has never smoked. He has never used smokeless tobacco. He reports that he drinks alcohol. He reports that he does not use drugs.   Family History:  The patient's family history includes Heart attack (age of onset: 29) in his father; Heart attack (age of onset: 105) in his mother; Heart disease in his brother and father; Hypertension in his mother.    ROS:  Please see the history of present illness.   Otherwise, review of systems are positive for none.   All other systems are reviewed and negative.    PHYSICAL EXAM: VS:  BP (!) 112/54 (BP Location: Left Arm, Patient Position: Sitting, Cuff Size: Normal)   Pulse (!) 52   Ht 5\' 6"  (1.676 m)   Wt 189 lb 12 oz (86.1 kg)   BMI 30.63 kg/m  , BMI Body mass index is 30.63 kg/m. GEN: Well nourished, well developed, in no acute distress  HEENT: normal  Neck: no JVD, carotid bruits, or masses Cardiac: RRR; no murmurs, rubs, or gallops,no edema  Respiratory:  clear to auscultation bilaterally, normal work of breathing GI: soft, nontender, nondistended, + BS MS: no deformity or atrophy  Skin: warm and dry, no rash Neuro:  Strength and sensation are intact Psych: euthymic mood, full affect Vascular: Femoral pulses are normal. Distal pulses are normal.   EKG:  EKG is ordered today. The ekg ordered today demonstrates sinus bradycardia with nonspecific T wave changes   Recent  Labs: 01/19/2017: Hemoglobin 12.4; Platelets 209 09/21/2017: ALT 6; BUN 19; Creatinine, Ser 1.73; Potassium 4.4; Sodium 137    Lipid Panel    Component Value Date/Time   CHOL 118 09/21/2017 0915   TRIG 89.0 09/21/2017 0915   HDL 62.30 09/21/2017 0915   CHOLHDL 2 09/21/2017 0915   VLDL 17.8 09/21/2017 0915   LDLCALC 38 09/21/2017 0915   LDLDIRECT 38.0 08/06/2015 1118      Wt Readings from Last 3 Encounters:  01/14/18 189 lb 12 oz (86.1 kg)  09/21/17 189 lb (85.7 kg)  08/20/17 188 lb (85.3 kg)        ASSESSMENT AND PLAN:  1.  Coronary  artery disease involving native coronary arteries with worsening angina : Stress test last year was mildly abnormal with anteroseptal ischemia.  The patient's exertional dyspnea suggestive of class III angina in spite of 3 antianginal medications including carvedilol, isosorbide and Ranexa.    Echocardiogram last year also showed evidence of mild to moderate pulmonary hypertension with systolic pulmonary pressure of 45 mmHg.  Due to all of that, I recommend proceeding with a right and left cardiac catheterization for a definitive diagnosis. This can be done via the arm approach but will have to use the left radial artery in the right antecubital vein.  2. Bilateral carotid artery disease:  Mild nonobstructive disease. Continue treatment of risk factors.  3. Hyperlipidemia: Continue treatment with simvastatin. Most recent LDL was 41  4. Essential hypertension: Blood pressure is well controlled on current medications  5. Exertional leg pain: This does not seem to be due to peripheral arterial disease.  Disposition:   FU with me in 1 month  Signed,  [name], MD  01/14/2018 10:05 AM    [place]

## 2018-01-14 NOTE — H&P (View-Only) (Signed)
Cardiology Office Note   Date:  01/14/2018   ID:  Athanasius Kesling., DOB Nov 05, 1945, MRN [id number]  PCP:  [name], MD  Cardiologist:   [name], MD   Chief Complaint  Patient presents with  . other    6 mo follow up.medications verbally reviewed with pt.       History of Present Illness: Reyden Smith. is a 72 y.o. male who presents for a followup visit.  He has known history of coronary artery disease status post CABG in 1999. He also has known history of hypertension, chronic kidney disease, hyperlipidemia and type 2 diabetes. He underwent a cardiac catheterization in 2011 which showed patent grafts. He continued to have mild exertional chest pain. A treadmill nuclear stress test in 06/2012 showed normal ejection fraction. Fixed inferior wall defect with normal wall motion and moderate reversibility in the septal wall.  Previous carotid Doppler showed mild nonobstructive bilateral disease.  He had worsening exertional dyspnea last year.  He underwent a treadmill Myoview and had mild 1 mm ST depression in the inferior and anterolateral leads with inability to reach target heart rate.  He was switched to [employer].  Perfusion showed mild anteroseptal ischemia with normal ejection fraction.  Echocardiogram showed an EF of 60 to 65% with normal diastolic function, mild mitral regurgitation and mild pulmonary hypertension with peak systolic pressure of 45 mmHg.  He reports no improvement in dyspnea.  If anything, he describes gradual worsening and decline over the last year with significant exertional dyspnea and fatigue that has limited his ability to exercise.  No chest pain.  In addition to that, he continues to complain of thigh pain with activities.  His vascular exam showed normal pulses.  He underwent lower extremity arterial Doppler last year that showed normal ABI and waveforms.  There was no evidence of peripheral arterial disease.    Past Medical History:    Diagnosis Date  . 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 Diabetes mellitus without complication ([place])   . History of cardiac catheterization 2011   [facility]  . Hyperlipidemia   . Hypertension   . Hypertriglyceridemia   . Parkinson's disease ([place])   . 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 Vertigo     Past Surgical History:  Procedure Laterality Date  . CARDIAC CATHETERIZATION  05-19-2010   [facility]: Patent grafts. LIMA to LAD, SVG to D1, OM1 and RPDA  . CORONARY ARTERY BYPASS GRAFT  03/1998   5 vessel, [facility]     Current Outpatient Medications  Medication Sig Dispense Refill  . aspirin 81 MG tablet Take by mouth. Takes 2 tablets daily.    . carbidopa-levodopa (SINEMET IR) 25-100 MG tablet TAKE 1 AND 1/2 TABLETS BY MOUTH THREE TIMES DAILY (Patient taking differently: Take 1 tablet by mouth 3 (three) times daily. ) 405 tablet 0  . carvedilol (COREG) 6.25 MG tablet TAKE 1 TABLET BY MOUTH TWICE DAILY 180 tablet 2  . clobetasol cream (TEMOVATE) 3.42 % Apply 1 application topically once a week. Apply medication once a week 30 g 2  . clonazePAM (KLONOPIN) 0.5 MG tablet Take 0.5 tablets (0.25 mg total) by mouth at bedtime. 45 tablet 0  . hydrochlorothiazide (HYDRODIURIL) 25 MG tablet Take 25 mg by mouth daily.     . isosorbide mononitrate (IMDUR) 60 MG 24 hr tablet TAKE 1 TABLET BY MOUTH DAILY 90 tablet 0  . losartan (COZAAR)  25 MG tablet TAKE 1 TABLET BY MOUTH EVERY DAY 90 tablet 1  . Multiple Vitamin (MULTIVITAMIN) tablet Take 1 tablet by mouth daily.      . nitroGLYCERIN (NITROSTAT) 0.4 MG SL tablet Place 1 tablet (0.4 mg total) under the tongue every 5 (five) minutes as needed for chest pain. 25 tablet 1  . Omega-3 Fatty Acids (FISH OIL) 1200 MG CAPS Take by mouth daily.    Marland Kitchen omeprazole (PRILOSEC) 20 MG capsule TAKE 1 CAPSULE BY MOUTH EVERY MORNING AS DIRECTED 90 capsule 1  . polyethylene glycol (MIRALAX / GLYCOLAX) packet Take by mouth.    . ranolazine (RANEXA) 1000 MG SR tablet  TAKE 1 TABLET(1000 MG) BY MOUTH TWICE DAILY 60 tablet 0  . simvastatin (ZOCOR) 20 MG tablet TAKE 1 TABLET BY MOUTH EVERY NIGHT AT BEDTIME 90 tablet 0  . tamsulosin (FLOMAX) 0.4 MG CAPS capsule Take 1 capsule (0.4 mg total) by mouth daily. 30 capsule 6  . zolpidem (AMBIEN) 10 MG tablet TAKE 1 TABLET BY MOUTH EVERY NIGHT AT BEDTIME AS NEEDED FOR SLEEP 30 tablet 0  . PREVIDENT 5000 BOOSTER PLUS 1.1 % PSTE U UTD  0   No current facility-administered medications for this visit.     Allergies:   Patient has no known allergies.    Social History:  The patient  reports that he has never smoked. He has never used smokeless tobacco. He reports that he drinks alcohol. He reports that he does not use drugs.   Family History:  The patient's family history includes Heart attack (age of onset: 26) in his father; Heart attack (age of onset: 47) in his mother; Heart disease in his brother and father; Hypertension in his mother.    ROS:  Please see the history of present illness.   Otherwise, review of systems are positive for none.   All other systems are reviewed and negative.    PHYSICAL EXAM: VS:  BP (!) 112/54 (BP Location: Left Arm, Patient Position: Sitting, Cuff Size: Normal)   Pulse (!) 52   Ht 5\' 6"  (1.676 m)   Wt 189 lb 12 oz (86.1 kg)   BMI 30.63 kg/m  , BMI Body mass index is 30.63 kg/m. GEN: Well nourished, well developed, in no acute distress  HEENT: normal  Neck: no JVD, carotid bruits, or masses Cardiac: RRR; no murmurs, rubs, or gallops,no edema  Respiratory:  clear to auscultation bilaterally, normal work of breathing GI: soft, nontender, nondistended, + BS MS: no deformity or atrophy  Skin: warm and dry, no rash Neuro:  Strength and sensation are intact Psych: euthymic mood, full affect Vascular: Femoral pulses are normal. Distal pulses are normal.   EKG:  EKG is ordered today. The ekg ordered today demonstrates sinus bradycardia with nonspecific T wave changes   Recent  Labs: 01/19/2017: Hemoglobin 12.4; Platelets 209 09/21/2017: ALT 6; BUN 19; Creatinine, Ser 1.73; Potassium 4.4; Sodium 137    Lipid Panel    Component Value Date/Time   CHOL 118 09/21/2017 0915   TRIG 89.0 09/21/2017 0915   HDL 62.30 09/21/2017 0915   CHOLHDL 2 09/21/2017 0915   VLDL 17.8 09/21/2017 0915   LDLCALC 38 09/21/2017 0915   LDLDIRECT 38.0 08/06/2015 1118      Wt Readings from Last 3 Encounters:  01/14/18 189 lb 12 oz (86.1 kg)  09/21/17 189 lb (85.7 kg)  08/20/17 188 lb (85.3 kg)        ASSESSMENT AND PLAN:  1.  Coronary  artery disease involving native coronary arteries with worsening angina : Stress test last year was mildly abnormal with anteroseptal ischemia.  The patient's exertional dyspnea suggestive of class III angina in spite of 3 antianginal medications including carvedilol, isosorbide and Ranexa.    Echocardiogram last year also showed evidence of mild to moderate pulmonary hypertension with systolic pulmonary pressure of 45 mmHg.  Due to all of that, I recommend proceeding with a right and left cardiac catheterization for a definitive diagnosis. This can be done via the arm approach but will have to use the left radial artery in the right antecubital vein.  2. Bilateral carotid artery disease:  Mild nonobstructive disease. Continue treatment of risk factors.  3. Hyperlipidemia: Continue treatment with simvastatin. Most recent LDL was 41  4. Essential hypertension: Blood pressure is well controlled on current medications  5. Exertional leg pain: This does not seem to be due to peripheral arterial disease.  Disposition:   FU with me in 1 month  Signed,  [name], MD  01/14/2018 10:05 AM    [place]

## 2018-01-14 NOTE — Patient Instructions (Addendum)
Medication Instructions: Your physician recommends that you continue on your current medications as directed. Please refer to the Current Medication list given to you today.  If you need a refill on your cardiac medications before your next appointment, please call your pharmacy.   Follow-Up: Your physician wants you to follow-up in one month with [name].   Thank you for choosing Heartcare at [facility]!      Johnathan Arnold, [place] [address] Dept: [phone] Loc: [phone]  Johnathan Arnold.  01/14/2018  You are scheduled for a Cardiac Catheterization on Monday, August 26 with Dr. [name].  1. Arrive at the [place] entrance at 8:30, one hour prior to your procedure. Free valet service is available.  After entering the [place] please check-in at the registration desk (1st desk on your right) to receive your armband. After receiving your armband someone will escort you to the cardiac cath/special procedures waiting area.  Special note: Every effort is made to have your procedure done on time. Please understand that emergencies sometimes delay scheduled procedures.  2. Diet: Do not eat solid foods after midnight.  The patient may have clear liquids until 5am upon the day of the procedure.  3. Labs: You will need to have blood drawn on  4. Medication instructions in preparation for your procedure: Hold the hydrochlorothiazide the morning of the procedure.  On the morning of your procedure, take your Aspirin and any morning medicines NOT listed above.  You may use sips of water.  5. Plan for one night stay--bring personal belongings. 6. Bring a current list of your medications and current insurance cards. 7. You MUST have a responsible person to drive you home. 8. Someone MUST be with you the first 24 hours after you arrive home or your discharge  will be delayed. 9. Please wear clothes that are easy to get on and off and wear slip-on shoes.  Thank you for allowing [place] to care for you!   -- Johnathan Arnold [facility]

## 2018-01-15 LAB — BASIC METABOLIC PANEL
BUN/Creatinine Ratio: 10 (ref 10–24)
BUN: 16 mg/dL (ref 8–27)
CALCIUM: 9.3 mg/dL (ref 8.6–10.2)
CHLORIDE: 103 mmol/L (ref 96–106)
CO2: 21 mmol/L (ref 20–29)
Creatinine, Ser: 1.6 mg/dL — ABNORMAL HIGH (ref 0.76–1.27)
GFR calc Af Amer: 49 mL/min/{1.73_m2} — ABNORMAL LOW (ref >59)
GFR calc non Af Amer: 42 mL/min/{1.73_m2} — ABNORMAL LOW (ref >59)
Glucose: 113 mg/dL — ABNORMAL HIGH (ref 65–99)
POTASSIUM: 4.8 mmol/L (ref 3.5–5.2)
Sodium: 142 mmol/L (ref 134–144)

## 2018-01-15 LAB — CBC
HEMOGLOBIN: 11.8 g/dL — AB (ref 13.0–17.7)
Hematocrit: 33.8 % — ABNORMAL LOW (ref 37.5–51.0)
MCH: 33.5 pg — ABNORMAL HIGH (ref 26.6–33.0)
MCHC: 34.9 g/dL (ref 31.5–35.7)
MCV: 96 fL (ref 79–97)
Platelets: 162 10*3/uL (ref 150–450)
RBC: 3.52 x10E6/uL — AB (ref 4.14–5.80)
RDW: 13.2 % (ref 12.3–15.4)
WBC: 3.8 10*3/uL (ref 3.4–10.8)

## 2018-01-20 ENCOUNTER — Telehealth: Payer: Self-pay | Admitting: *Deleted

## 2018-01-20 NOTE — Telephone Encounter (Signed)
-----   Message from Wellington Hampshire, MD sent at 01/15/2018  4:24 PM EDT ----- Jearl Klinefelter labs showed stable renal function but he does need 4 hours of hydration before cardiac cath.

## 2018-01-20 NOTE — Telephone Encounter (Signed)
Left a message to call back. Patient is scheduled for an 8:30 cardiac cath on 02/01/18.

## 2018-01-21 ENCOUNTER — Telehealth: Payer: Self-pay | Admitting: *Deleted

## 2018-01-21 NOTE — Telephone Encounter (Signed)
-----   Message from Wellington Hampshire, MD sent at 01/15/2018  4:24 PM EDT ----- Jearl Klinefelter labs showed stable renal function but he does need 4 hours of hydration before cardiac cath.

## 2018-01-21 NOTE — Telephone Encounter (Signed)
Patient has been made aware to be at the [facility] medical mall at 6:30 for 4 hours of hydration and the procedure will now start at 10:30.

## 2018-01-22 NOTE — Telephone Encounter (Signed)
Duplicate. Please see prior encounter

## 2018-01-28 ENCOUNTER — Ambulatory Visit: Payer: Medicare Other | Admitting: Neurology

## 2018-01-30 ENCOUNTER — Other Ambulatory Visit: Payer: Self-pay | Admitting: Neurology

## 2018-01-30 ENCOUNTER — Other Ambulatory Visit: Payer: Self-pay | Admitting: Cardiovascular Disease

## 2018-02-01 ENCOUNTER — Encounter: Payer: Self-pay | Admitting: *Deleted

## 2018-02-01 ENCOUNTER — Ambulatory Visit
Admission: RE | Admit: 2018-02-01 | Discharge: 2018-02-01 | Disposition: A | Discharge status: Home / Self Care | Payer: Medicare Other | Source: Ambulatory Visit | Attending: Cardiovascular Disease | Admitting: Cardiovascular Disease

## 2018-02-01 ENCOUNTER — Encounter
Admission: RE | Disposition: A | Discharge status: Home / Self Care | Payer: Self-pay | Source: Ambulatory Visit | Attending: Cardiovascular Disease

## 2018-02-01 DIAGNOSIS — G20 Parkinson's disease: Secondary | ICD-10-CM | POA: Diagnosis not present

## 2018-02-01 DIAGNOSIS — Z951 Presence of aortocoronary bypass graft: Secondary | ICD-10-CM | POA: Diagnosis not present

## 2018-02-01 DIAGNOSIS — I6523 Occlusion and stenosis of bilateral carotid arteries: Secondary | ICD-10-CM | POA: Diagnosis not present

## 2018-02-01 DIAGNOSIS — E781 Pure hyperglyceridemia: Secondary | ICD-10-CM | POA: Diagnosis not present

## 2018-02-01 DIAGNOSIS — Z7982 Long term (current) use of aspirin: Secondary | ICD-10-CM | POA: Insufficient documentation

## 2018-02-01 DIAGNOSIS — E1122 Type 2 diabetes mellitus with diabetic chronic kidney disease: Secondary | ICD-10-CM | POA: Diagnosis not present

## 2018-02-01 DIAGNOSIS — I272 Pulmonary hypertension, unspecified: Secondary | ICD-10-CM | POA: Diagnosis not present

## 2018-02-01 DIAGNOSIS — Z8249 Family history of ischemic heart disease and other diseases of the circulatory system: Secondary | ICD-10-CM | POA: Insufficient documentation

## 2018-02-01 DIAGNOSIS — I208 Other forms of angina pectoris: Secondary | ICD-10-CM

## 2018-02-01 DIAGNOSIS — N189 Chronic kidney disease, unspecified: Secondary | ICD-10-CM | POA: Diagnosis not present

## 2018-02-01 DIAGNOSIS — Z79899 Other long term (current) drug therapy: Secondary | ICD-10-CM | POA: Diagnosis not present

## 2018-02-01 DIAGNOSIS — R0600 Dyspnea, unspecified: Secondary | ICD-10-CM

## 2018-02-01 DIAGNOSIS — I2089 Other forms of angina pectoris: Secondary | ICD-10-CM

## 2018-02-01 DIAGNOSIS — Z955 Presence of coronary angioplasty implant and graft: Secondary | ICD-10-CM | POA: Diagnosis not present

## 2018-02-01 DIAGNOSIS — I129 Hypertensive chronic kidney disease with stage 1 through stage 4 chronic kidney disease, or unspecified chronic kidney disease: Secondary | ICD-10-CM | POA: Insufficient documentation

## 2018-02-01 DIAGNOSIS — R0602 Shortness of breath: Secondary | ICD-10-CM | POA: Diagnosis not present

## 2018-02-01 DIAGNOSIS — I25118 Atherosclerotic heart disease of native coronary artery with other forms of angina pectoris: Secondary | ICD-10-CM

## 2018-02-01 HISTORY — PX: RIGHT/LEFT HEART CATH AND CORONARY ANGIOGRAPHY: CATH118266

## 2018-02-01 SURGERY — RIGHT/LEFT HEART CATH AND CORONARY ANGIOGRAPHY
Anesthesia: Moderate Sedation

## 2018-02-01 MED ORDER — SODIUM CHLORIDE 0.9% FLUSH: 3.0000 mL | INTRAVENOUS | Status: DC | PRN

## 2018-02-01 MED ORDER — SODIUM CHLORIDE 0.9 % IV SOLN
INTRAVENOUS | Status: DC
  Administered 2018-02-01: 1000 mL via INTRAVENOUS

## 2018-02-01 MED ORDER — FENTANYL CITRATE (PF) 100 MCG/2ML IJ SOLN
INTRAMUSCULAR | Status: DC | PRN
  Administered 2018-02-01: 50 ug via INTRAVENOUS

## 2018-02-01 MED ORDER — ASPIRIN 81 MG PO CHEW: 81.0000 mg | CHEWABLE_TABLET | ORAL | Status: DC

## 2018-02-01 MED ORDER — FENTANYL CITRATE (PF) 100 MCG/2ML IJ SOLN
INTRAMUSCULAR | Status: AC
  Filled 2018-02-01: qty 2

## 2018-02-01 MED ORDER — SODIUM CHLORIDE 0.9 % IV SOLN: 250.0000 mL | INTRAVENOUS | Status: DC | PRN

## 2018-02-01 MED ORDER — MIDAZOLAM HCL 2 MG/2ML IJ SOLN
INTRAMUSCULAR | Status: AC
  Filled 2018-02-01: qty 2

## 2018-02-01 MED ORDER — ACETAMINOPHEN 325 MG PO TABS: 650.0000 mg | ORAL_TABLET | ORAL | Status: DC | PRN

## 2018-02-01 MED ORDER — MIDAZOLAM HCL 2 MG/2ML IJ SOLN
INTRAMUSCULAR | Status: DC | PRN
  Administered 2018-02-01: 1 mg via INTRAVENOUS

## 2018-02-01 MED ORDER — SODIUM CHLORIDE 0.9% FLUSH: 3.0000 mL | Freq: Two times a day (BID) | INTRAVENOUS | Status: DC

## 2018-02-01 MED ORDER — ONDANSETRON HCL 4 MG/2ML IJ SOLN: 4.0000 mg | Freq: Four times a day (QID) | INTRAMUSCULAR | Status: DC | PRN

## 2018-02-01 MED ORDER — HEPARIN (PORCINE) IN NACL 1000-0.9 UT/500ML-% IV SOLN
INTRAVENOUS | Status: AC
  Filled 2018-02-01: qty 1000

## 2018-02-01 MED ORDER — SODIUM CHLORIDE 0.9 % IV SOLN: INTRAVENOUS | Status: DC

## 2018-02-01 SURGICAL SUPPLY — 15 items
CATH INFINITI 5 FR IM (CATHETERS) ×1 IMPLANT
CATH INFINITI 5FR JL4 (CATHETERS) ×1 IMPLANT
CATH INFINITI JR4 5F (CATHETERS) ×1 IMPLANT
CATH SWANZ 7F THERMO (CATHETERS) ×1 IMPLANT
DEVICE CLOSURE MYNXGRIP 5F (Vascular Products) ×1 IMPLANT
GUIDEWIRE EMER 3M J .025X150CM (WIRE) ×1 IMPLANT
KIT MANI 3VAL PERCEP (MISCELLANEOUS) ×2 IMPLANT
KIT RIGHT HEART (MISCELLANEOUS) ×2 IMPLANT
NDL PERC 18GX7CM (NEEDLE) IMPLANT
NEEDLE PERC 18GX7CM (NEEDLE) ×2 IMPLANT
PACK CARDIAC CATH (CUSTOM PROCEDURE TRAY) ×2 IMPLANT
SHEATH AVANTI 5FR X 11CM (SHEATH) ×1 IMPLANT
SHEATH AVANTI 7FRX11 (SHEATH) ×1 IMPLANT
WIRE EMERALD 3MM-J .035X260CM (WIRE) ×1 IMPLANT
WIRE GUIDERIGHT .035X150 (WIRE) ×1 IMPLANT

## 2018-02-01 NOTE — Discharge Instructions (Signed)
Moderate Conscious Sedation, Adult, Care After °These instructions provide you with information about caring for yourself after your procedure. Your health care provider may also give you more specific instructions. Your treatment has been planned according to current medical practices, but problems sometimes occur. Call your health care provider if you have any problems or questions after your procedure. °What can I expect after the procedure? °After your procedure, it is common: °· To feel sleepy for several hours. °· To feel clumsy and have poor balance for several hours. °· To have poor judgment for several hours. °· To vomit if you eat too soon. ° °Follow these instructions at home: °For at least 24 hours after the procedure: ° °· Do not: °? Participate in activities where you could fall or become injured. °? Drive. °? Use heavy machinery. °? Drink alcohol. °? Take sleeping pills or medicines that cause drowsiness. °? Make important decisions or sign legal documents. °? Take care of children on your own. °· Rest. °Eating and drinking °· Follow the diet recommended by your health care provider. °· If you vomit: °? Drink water, juice, or soup when you can drink without vomiting. °? Make sure you have little or no nausea before eating solid foods. °General instructions °· Have a responsible adult stay with you until you are awake and alert. °· Take over-the-counter and prescription medicines only as told by your health care provider. °· If you smoke, do not smoke without supervision. °· Keep all follow-up visits as told by your health care provider. This is important. °Contact a health care provider if: °· You keep feeling nauseous or you keep vomiting. °· You feel light-headed. °· You develop a rash. °· You have a fever. °Get help right away if: °· You have trouble breathing. °This information is not intended to replace advice given to you by your health care provider. Make sure you discuss any questions you have  with your health care provider. °Document Released: 03/16/2013 Document Revised: 10/29/2015 Document Reviewed: 09/15/2015 °Elsevier Interactive Patient Education © 2018 [employer] °Angiogram, Care After °This sheet gives you information about how to care for yourself after your procedure. Your doctor may also give you more specific instructions. If you have problems or questions, contact your doctor. °Follow these instructions at home: °Insertion site care °· Follow instructions from your doctor about how to take care of your long, thin tube (catheter) insertion area. Make sure you: °? Wash your hands with soap and water before you change your bandage (dressing). If you cannot use soap and water, use hand sanitizer. °? Change your bandage as told by your doctor. °? Leave stitches (sutures), skin glue, or skin tape (adhesive) strips in place. They may need to stay in place for 2 weeks or longer. If tape strips get loose and curl up, you may trim the loose edges. Do not remove tape strips completely unless your doctor says it is okay. °· Do not take baths, swim, or use a hot tub until your doctor says it is okay. °· You may shower 24-48 hours after the procedure or as told by your doctor. °? Gently wash the area with plain soap and water. °? Pat the area dry with a clean towel. °? Do not rub the area. This may cause bleeding. °· Do not apply powder or lotion to the area. Keep the area clean and dry. °· Check your insertion area every day for signs of infection. Check for: °? More redness, swelling, or pain. °?   Fluid or blood. °? Warmth. °? Pus or a bad smell. °Activity °· Rest as told by your doctor, usually for 1-2 days. °· Do not lift anything that is heavier than 10 lbs. (4.5 kg) or as told by your doctor. °· Do not drive for 24 hours if you were given a medicine to help you relax (sedative). °· Do not drive or use heavy machinery while taking prescription pain medicine. °General instructions °· Go back to your  normal activities as told by your doctor, usually in about a week. Ask your doctor what activities are safe for you. °· If the insertion area starts to bleed, lie flat and put pressure on the area. If the bleeding does not stop, get help right away. This is an emergency. °· Drink enough fluid to keep your pee (urine) clear or pale yellow. °· Take over-the-counter and prescription medicines only as told by your doctor. °· Keep all follow-up visits as told by your doctor. This is important. °Contact a doctor if: °· You have a fever. °· You have chills. °· You have more redness, swelling, or pain around your insertion area. °· You have fluid or blood coming from your insertion area. °· The insertion area feels warm to the touch. °· You have pus or a bad smell coming from your insertion area. °· You have more bruising around the insertion area. °· Blood collects in the tissue around the insertion area (hematoma) that may be painful to the touch. °Get help right away if: °· You have a lot of pain in the insertion area. °· The insertion area swells very fast. °· The insertion area is bleeding, and the bleeding does not stop after holding steady pressure on the area. °· The area near or just beyond the insertion area becomes pale, cool, tingly, or numb. °These symptoms may be an emergency. Do not wait to see if the symptoms will go away. Get medical help right away. Call your local emergency services (911 in the [place] Do not drive yourself to the hospital. °Summary °· After the procedure, it is common to have bruising and tenderness at the long, thin tube insertion area. °· After the procedure, it is important to rest and drink plenty of fluids. °· Do not take baths, swim, or use a hot tub until your doctor says it is okay to do so. You may shower 24-48 hours after the procedure or as told by your doctor. °· If the insertion area starts to bleed, lie flat and put pressure on the area. If the bleeding does not stop, get  help right away. This is an emergency. °This information is not intended to replace advice given to you by your health care provider. Make sure you discuss any questions you have with your health care provider. °Document Released: 08/22/2008 Document Revised: 05/20/2016 Document Reviewed: 05/20/2016 °Elsevier Interactive Patient Education © 2017 Elsevier Inc. ° °

## 2018-02-01 NOTE — Interval H&P Note (Signed)
Cath Lab Visit (complete for each Cath Lab visit)  Clinical Evaluation Leading to the Procedure:   ACS: No.  Non-ACS:    Anginal Classification: CCS III  Anti-ischemic medical therapy: Maximal Therapy (2 or more classes of medications)  Non-Invasive Test Results: Equivocal test results  Prior CABG: Previous CABG      History and Physical Interval Note:  02/01/2018 10:51 AM  Johnathan Arnold.  has presented today for surgery, with the diagnosis of RT and LT Cath   Shortness of breath   Abnormal functional study     PT TO ARRIVE 4 HRS PRIOR FOR HYDRATION per Dr Fletcher Anon  The various methods of treatment have been discussed with the patient and family. After consideration of risks, benefits and other options for treatment, the patient has consented to  Procedure(s): RIGHT/LEFT HEART CATH AND CORONARY ANGIOGRAPHY (N/A) as a surgical intervention .  The patient's history has been reviewed, patient examined, no change in status, stable for surgery.  I have reviewed the patient's chart and labs.  Questions were answered to the patient's satisfaction.     Johnathan Arnold

## 2018-02-02 ENCOUNTER — Telehealth: Payer: Self-pay | Admitting: Cardiovascular Disease

## 2018-02-02 NOTE — Telephone Encounter (Signed)
Patient had cath yesterday.  C/o swelling at site in groin about the size of half pecan and feels hard.  Patient denies any other symptoms . No fever.  Unable to see tell if site is red .  Denies drainage/ bleeding at this time.  Patient wants to know if he should come to office for someone to check the site.

## 2018-02-02 NOTE — Telephone Encounter (Signed)
Patient called and stated that he had a lump on the groin site from his cardiac cath yesterday. He was advised to take the bandage off and assess the site. He stated that the site looked good and that it was the cotton from the bandage that had bundled up. He will call back if anything further is needed.

## 2018-02-02 NOTE — Telephone Encounter (Signed)
Patient calling to check on status °Please call  °

## 2018-02-02 NOTE — Telephone Encounter (Signed)
Patient calling in to check on status of last call

## 2018-02-03 ENCOUNTER — Telehealth: Payer: Self-pay | Admitting: Internal Medicine

## 2018-02-03 NOTE — Telephone Encounter (Signed)
Transition Care Management Follow-up Telephone Call  How have you been since you were released from the hospital? Patient stated feeling much better at this time,   Do you understand why you were in the hospital? yes   Do you understand the discharge instrcutions? yes  Items Reviewed:  Medications reviewed: yes  Allergies reviewed: yes  Dietary changes reviewed: yes  Referrals reviewed: yes   Functional Questionnaire:   Activities of Daily Living (ADLs):   He states they are independent in the following: ambulation, bathing and hygiene, feeding, continence, grooming, toileting and dressing States they require assistance with the following: No assistance needed at this time   Any transportation issues/concerns?: no   Any patient concerns? no   Confirmed importance and date/time of follow-up visits scheduled: yes   Confirmed with patient if condition begins to worsen call PCP or go to the ER.  Patient was given the Call-a-Nurse line [phone]: yes

## 2018-02-12 ENCOUNTER — Ambulatory Visit: Payer: Medicare Other | Admitting: Cardiovascular Disease

## 2018-02-15 ENCOUNTER — Other Ambulatory Visit: Payer: Self-pay | Admitting: Neurology

## 2018-02-17 ENCOUNTER — Ambulatory Visit (INDEPENDENT_AMBULATORY_CARE_PROVIDER_SITE_OTHER): Payer: Medicare Other | Admitting: Internal Medicine

## 2018-02-17 ENCOUNTER — Encounter: Payer: Self-pay | Admitting: Internal Medicine

## 2018-02-17 ENCOUNTER — Ambulatory Visit (INDEPENDENT_AMBULATORY_CARE_PROVIDER_SITE_OTHER): Payer: Medicare Other

## 2018-02-17 VITALS — BP 120/56 | HR 52 | Temp 98.0°F | Resp 15 | Ht 66.0 in | Wt 188.8 lb

## 2018-02-17 DIAGNOSIS — M79604 Pain in right leg: Secondary | ICD-10-CM

## 2018-02-17 DIAGNOSIS — E1121 Type 2 diabetes mellitus with diabetic nephropathy: Secondary | ICD-10-CM

## 2018-02-17 DIAGNOSIS — R29898 Other symptoms and signs involving the musculoskeletal system: Secondary | ICD-10-CM | POA: Diagnosis not present

## 2018-02-17 DIAGNOSIS — M79605 Pain in left leg: Secondary | ICD-10-CM

## 2018-02-17 DIAGNOSIS — I25118 Atherosclerotic heart disease of native coronary artery with other forms of angina pectoris: Secondary | ICD-10-CM

## 2018-02-17 DIAGNOSIS — N183 Chronic kidney disease, stage 3 unspecified: Secondary | ICD-10-CM

## 2018-02-17 DIAGNOSIS — M47816 Spondylosis without myelopathy or radiculopathy, lumbar region: Secondary | ICD-10-CM | POA: Diagnosis not present

## 2018-02-17 LAB — BASIC METABOLIC PANEL
BUN: 20 mg/dL (ref 6–23)
CALCIUM: 9.5 mg/dL (ref 8.4–10.5)
CHLORIDE: 104 meq/L (ref 96–112)
CO2: 29 meq/L (ref 19–32)
Creatinine, Ser: 1.81 mg/dL — ABNORMAL HIGH (ref 0.40–1.50)
GFR: 39.24 mL/min — ABNORMAL LOW (ref >60.00)
GLUCOSE: 101 mg/dL — AB (ref 70–99)
Potassium: 5.4 mEq/L — ABNORMAL HIGH (ref 3.5–5.1)
SODIUM: 138 meq/L (ref 135–145)

## 2018-02-17 NOTE — Patient Instructions (Signed)
Your leg weakness may be coming from a spinal issue given your prior vertebral fracture   x rays of lower spine today and repeat assessment of kidney function    See you in a month

## 2018-02-17 NOTE — Progress Notes (Signed)
Subjective:  Patient ID: Johnathan Silsby., male    DOB: 12-25-1945  Age: 72 y.o. MRN: [id number]  CC: The primary encounter diagnosis was CKD (chronic kidney disease) stage 3, GFR 30-59 ml/min ([place]). 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  HPI Johnathan Arnold. presents for  Follow up on type 2 DM.  Other issues   He underwent a cardiac cath on  August 26 for evaluation of preoggressive exercise intolerance limited by dyspnea and thigh pain. 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 "continue  Medical  Therapy"  Continues to feel significant leg weakness  And thigh pain that limits his exercise tolerance.  He has no PAD by recent evaluation. 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  No recent x rays or MRI     Outpatient Medications Prior to Visit  Medication Sig Dispense Refill  . aspirin 81 MG tablet Take 162 mg by mouth daily. Takes 2 tablets daily.    . carbidopa-levodopa (SINEMET IR) 25-100 MG tablet Take 1 tablet by mouth 3 (three) times daily. 270 tablet 1  . carvedilol (COREG) 6.25 MG tablet TAKE 1 TABLET BY MOUTH TWICE DAILY (Patient taking differently: Take 6.25 mg by mouth 2 (two) times daily with a meal. ) 180 tablet 2  . clobetasol cream (TEMOVATE) 5.39 % Apply 1 application topically once a week. Apply medication once a week 30 g 2  . clonazePAM (KLONOPIN) 0.5 MG tablet Take 0.5 tablets (0.25 mg total) by mouth at bedtime. 45 tablet 0  . hydrochlorothiazide (HYDRODIURIL) 25 MG tablet Take 25 mg by mouth every other day. Pt does not take on M W F    . isosorbide mononitrate (IMDUR) 60 MG 24 hr tablet TAKE 1 TABLET BY MOUTH DAILY (Patient taking differently: Take 60 mg by mouth daily. ) 90 tablet  0  . losartan (COZAAR) 25 MG tablet TAKE 1 TABLET BY MOUTH EVERY DAY 90 tablet 1  . Multiple Vitamin (MULTIVITAMIN) tablet Take 1 tablet by mouth daily.      . nitroGLYCERIN (NITROSTAT) 0.4 MG SL tablet Place 1 tablet (0.4 mg total) under the tongue every 5 (five) minutes as needed for chest pain. 25 tablet 1  . Omega-3 Fatty Acids (FISH OIL) 1200 MG CAPS Take 1 capsule by mouth daily.     Marland Kitchen omeprazole (PRILOSEC) 20 MG capsule TAKE 1 CAPSULE BY MOUTH EVERY MORNING AS DIRECTED (Patient taking differently: Take 20 mg by mouth every other day. ) 90 capsule 1  . polyethylene glycol (MIRALAX / GLYCOLAX) packet Take 17 g by mouth daily.     . ranolazine (RANEXA) 1000 MG SR tablet TAKE 1 TABLET(1000 MG) BY MOUTH TWICE DAILY 60 tablet 3  . simvastatin (ZOCOR) 20 MG tablet TAKE 1 TABLET BY MOUTH EVERY NIGHT AT BEDTIME (Patient taking differently: Take 20 mg by mouth at bedtime. ) 90 tablet 0  . tamsulosin (FLOMAX) 0.4 MG CAPS capsule Take 1 capsule (0.4 mg total) by mouth daily. 30 capsule 6  . zolpidem (AMBIEN) 10 MG tablet TAKE 1 TABLET BY MOUTH EVERY NIGHT AT BEDTIME AS NEEDED FOR SLEEP 30 tablet 0   No facility-administered medications prior to visit.  Review of Systems;  Patient denies headache, fevers, malaise, unintentional weight loss, skin rash, eye pain, sinus congestion and sinus pain, sore throat, dysphagia,  hemoptysis , cough, dyspnea, wheezing, chest pain, palpitations, orthopnea, edema, abdominal pain, nausea, melena, diarrhea, constipation, flank pain, dysuria, hematuria, urinary  Frequency, nocturia, numbness, tingling, seizures,  Focal weakness, Loss of consciousness,  Tremor, insomnia, depression, anxiety, and suicidal ideation.      Objective:  BP (!) 120/56 (BP Location: Left Arm, Patient Position: Sitting, Cuff Size: Normal)   Pulse (!) 52   Temp 98 F (36.7 C) (Oral)   Resp 15   Ht 5\' 6"  (1.676 m)   Wt 188 lb 12.8 oz (85.6 kg)   SpO2 97%   BMI 30.47 kg/m   BP  Readings from Last 3 Encounters:  02/17/18 (!) 120/56  02/01/18 (!) 136/59  01/14/18 (!) 112/54    Wt Readings from Last 3 Encounters:  02/17/18 188 lb 12.8 oz (85.6 kg)  02/01/18 189 lb 12 oz (86.1 kg)  01/14/18 189 lb 12 oz (86.1 kg)    General appearance: alert, cooperative and appears stated age Ears: normal TM's and external ear canals both ears Throat: lips, mucosa, and tongue normal; teeth and gums normal Neck: no adenopathy, no carotid bruit, supple, symmetrical, trachea midline and thyroid not enlarged, symmetric, no tenderness/mass/nodules Back: symmetric, no curvature. ROM normal. No CVA tenderness. Lungs: clear to auscultation bilaterally Heart: regular rate and rhythm, S1, S2 normal, no murmur, click, rub or gallop Abdomen: soft, non-tender; bowel sounds normal; no masses,  no organomegaly Pulses: 2+ and symmetric Skin: Skin color, texture, turgor normal. No rashes or lesions Lymph nodes: Cervical, supraclavicular, and axillary nodes normal.  Lab Results  Component Value Date   HGBA1C 5.8 09/21/2017   HGBA1C 6.7 (H) 02/04/2017   HGBA1C 5.6 08/06/2016    Lab Results  Component Value Date   CREATININE 1.81 (H) 02/17/2018   CREATININE 1.60 (H) 01/14/2018   CREATININE 1.73 (H) 09/21/2017    Lab Results  Component Value Date   WBC 3.8 01/14/2018   HGB 11.8 (L) 01/14/2018   HCT 33.8 (L) 01/14/2018   PLT 162 01/14/2018   GLUCOSE 101 (H) 02/17/2018   CHOL 118 09/21/2017   TRIG 89.0 09/21/2017   HDL 62.30 09/21/2017   LDLDIRECT 38.0 08/06/2015   LDLCALC 38 09/21/2017   ALT 6 09/21/2017   AST 16 09/21/2017   NA 138 02/17/2018   K 5.4 (H) 02/17/2018   CL 104 02/17/2018   CREATININE 1.81 (H) 02/17/2018   BUN 20 02/17/2018   CO2 29 02/17/2018   TSH 2.61 08/06/2016   PSA 1.08 08/01/2014   HGBA1C 5.8 09/21/2017   MICROALBUR 1.6 09/21/2017    No results found.  Assessment & Plan:   Problem List Items Addressed This Visit    CKD (chronic kidney  disease) stage 3, GFR 30-59 ml/min ([facility]) - Primary    Recheck of cr post  procedure (right and left heart cath) shows a slight bump in Cr as well as mild hyperkalemia.  Will suspend hctz,  increase hydration repeat in one week   Lab Results  Component Value Date   CREATININE 1.81 (H) 02/17/2018         Relevant Orders   Basic metabolic panel (Completed)   Leg pain, bilateral    Suspect his pain is referred from lumbar spine given lack of PAD on recent exam and history of lumbar vertebral fracture.  MRI lumbar spine advised.  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  Continue ASA, ARB, and statin   Lab Results  Component Value Date   HGBA1C 5.8 09/21/2017   Lab Results  Component Value Date   MICROALBUR 1.6 09/21/2017  '        Other Visit Diagnoses    Leg weakness, bilateral       Relevant Orders   DG Lumbar Spine Complete (Completed)      I am having Johnathan Arnold. maintain his multivitamin, aspirin, Fish Oil, zolpidem, polyethylene glycol, hydrochlorothiazide, nitroGLYCERIN, clobetasol cream, clonazePAM, carvedilol, omeprazole, tamsulosin, isosorbide mononitrate, losartan, simvastatin, ranolazine, and carbidopa-levodopa.  No orders of the defined types were placed in this encounter.   There are no discontinued medications.  Follow-up: No follow-ups on file.   [name], MD

## 2018-02-19 ENCOUNTER — Other Ambulatory Visit: Payer: Self-pay | Admitting: Internal Medicine

## 2018-02-19 DIAGNOSIS — E875 Hyperkalemia: Secondary | ICD-10-CM

## 2018-02-20 ENCOUNTER — Telehealth: Payer: Self-pay | Admitting: Internal Medicine

## 2018-02-20 DIAGNOSIS — M79605 Pain in left leg: Secondary | ICD-10-CM

## 2018-02-20 DIAGNOSIS — M79604 Pain in right leg: Secondary | ICD-10-CM | POA: Insufficient documentation

## 2018-02-20 NOTE — Assessment & Plan Note (Signed)
Recheck of cr post  procedure (right and left heart cath) shows a slight bump in Cr as well as mild hyperkalemia.  Will suspend hctz,  increase hydration repeat in one week   Lab Results  Component Value Date   CREATININE 1.81 (H) 02/17/2018

## 2018-02-20 NOTE — Assessment & Plan Note (Signed)
  Remains well controlled  On diet alone.  Continue ASA, ARB, and statin   Lab Results  Component Value Date   HGBA1C 5.8 09/21/2017   Lab Results  Component Value Date   MICROALBUR 1.6 09/21/2017  '

## 2018-02-20 NOTE — Assessment & Plan Note (Signed)
Suspect his pain is referred from lumbar spine given lack of PAD on recent exam and history of lumbar vertebral fracture.  MRI lumbar spine advised.

## 2018-02-26 ENCOUNTER — Ambulatory Visit: Payer: Medicare Other

## 2018-03-01 NOTE — Telephone Encounter (Signed)
Spoke with pt on the phone and got the lab appts figured out. Pt has been scheduled for tomorrow at 3:00pm. Pt is aware of appt date and time.

## 2018-03-02 ENCOUNTER — Other Ambulatory Visit (INDEPENDENT_AMBULATORY_CARE_PROVIDER_SITE_OTHER): Payer: Medicare Other

## 2018-03-02 DIAGNOSIS — E875 Hyperkalemia: Secondary | ICD-10-CM | POA: Diagnosis not present

## 2018-03-02 LAB — BASIC METABOLIC PANEL
BUN: 20 mg/dL (ref 6–23)
CHLORIDE: 102 meq/L (ref 96–112)
CO2: 29 meq/L (ref 19–32)
CREATININE: 2.11 mg/dL — AB (ref 0.40–1.50)
Calcium: 9.1 mg/dL (ref 8.4–10.5)
GFR: 32.88 mL/min — ABNORMAL LOW (ref >60.00)
GLUCOSE: 147 mg/dL — AB (ref 70–99)
Potassium: 4.4 mEq/L (ref 3.5–5.1)
Sodium: 138 mEq/L (ref 135–145)

## 2018-03-04 ENCOUNTER — Other Ambulatory Visit: Payer: Self-pay | Admitting: Internal Medicine

## 2018-03-04 NOTE — Progress Notes (Signed)
Johnathan Arnold. was seen today in the movement disorders clinic for neurologic consultation at the request of [name], MD.  This patient is accompanied in the office by his wife who supplements the history.  The consultation is for the evaluation of PD.  Pt currently under the care of Dr Manuella Ghazi and I have reviewed his records.  Began to see Dr. Manuella Ghazi in June, 2017.  His first symptom was right arm tremor.  By the time he began to see Dr. Manuella Ghazi in June, 2017 he had tremor in both hands.  He was started on carbidopa/levodopa 25/100, one tablet 3 times per day in June, 2017. Pt states that it did help tremor. He last saw Dr. Brigitte Pulse on 09/02/2016.  His levodopa was increased to 1-1/2 tablets 3 times per day (6-8am/1-2pm/6-7pm).  He cannot tell when the med wears off.   Specific Symptoms:  Tremor: Yes.  , mostly R hand at rest.   Family hx of similar:  No. (grandfather with tremor) Voice: maybe slight decrease Sleep: sleeps well  Vivid Dreams:  Yes.    Acting out dreams:  Yes.   (screams, has fallen out of bed) Wet Pillows: just a little Postural symptoms:  No.  Falls?  No. Bradykinesia symptoms: slight drag of the R leg Loss of smell: has never had great sense of smell Loss of taste:  No. Urinary Incontinence:  No. Difficulty Swallowing:  No. Handwriting, micrographia: Yes.   Trouble with ADL's:  No. (does brace self when putting on pants)  Trouble buttoning clothing: No. Depression:  No. Memory changes:  Some naming trouble (people) Hallucinations:  No.  visual distortions: No. N/V:  No. Lightheaded:  No. 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 Diplopia:  No. Dyskinesia:  No.  Patient had an MRI of the brain on 01/15/2015.  I had the opportunity to review this.  There was very mild small vessel disease.  Enhancing 11 x 13 mm cross-section soft tissue abnormality in the  RIGHT lateral mastoid. Cholesteatoma not excluded.   02/12/17 update:  Patient seen  today in follow-up for Parkinson's disease.  This patient is accompanied in the office by his spouse who supplements the history.   I asked him to hold his levodopa today, since I have never seen him off of levodopa.  He has been off of it for 24 hours.  He generally is on carbidopa/levodopa 25/100, 1.5 tablets 3 times per day.  I have reviewed records since our last visit.  He was in the emergency room on 01/19/2017 with complaints of shaking.  He felt that this was worse than his usual shaking and on the opposite side.  Workup was unremarkable in the emergency room.   Pt states that he was up in [place] with friends.  He felt like he was getting vertigo.  He was getting nauseated.  He took a valium for the vertigo.  He drove home.  He went to get out of the car and he was shaking on the L arm and leg.  Friends assisted him into the house.  Both legs felt weak but only the L was shaking and it only shook when he stood.  The shaking lasted minutes and seemed to go away when he laid down on the couch.  He went to sleep.  When he woke up he decided to get checked out.  He went to [facility] few weeks ago and sons asked  him to see another neurologist and they recommended neupro.  Pt didn't start that medication but has some samples.  One episode dizziness getting out of bed because got up too quickly.   He does c/o RLS.  That seems to come and go.  Wife notices him flailing around in sleep and its disturbing her sleep.    08/20/17 update: Patient was seen today as a work in.  He is accompanied by his spouse who supplements the history.  I have not seen him in about 6 months.  He states that he has been "playing with the carbidopa/levodopa 25/100" and was taking it around breakfast/lunch/dinner.  He is starting to take q 6 hours.  Thought that would improve tremor (wondered if overmedicating).  Noting some more tremor on the L side (not just the right).  Was previously on carbidopa/levodopa 25/100, 1-1/2 tablets 3 times  per day.  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  It helped but he d/c it as sx's went away.  He feels good still.  Pt denies falls.  Pt denies lightheadedness, near syncope.  No hallucinations.  Mood has been good.  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  Some word finding and memory trouble. Had nl EEG since last visit.  The records that were made available to me were reviewed.  Had flu in Jan.    03/08/18 update: Patient is seen today in follow-up for parkinsonism.  He is accompanied by his wife who supplements history.  Patient is currently on carbidopa/levodopa 25/100, 1 tablet 3 times per day.  No falls since last visit.  No lightheadedness or near syncope.  He is acting out the dreams.  This morning, he was pulling on his wifes arm.  He is not taking the klonopin any longer.  He is going to RSB 3 days per week.  Records are reviewed since our last visit.  He was in the hospital at the end of August for heart catheterization.  Following catheterization, continued medical therapy was recommended.  PREVIOUS MEDICATIONS: Sinemet, klonopin (helped but pt d/c as sx's were better)  ALLERGIES:  No Known Allergies  CURRENT MEDICATIONS:  Outpatient Encounter Medications as of 03/08/2018  Medication Sig  . aspirin 81 MG tablet Take 162 mg by mouth daily. Takes 2 tablets daily.  . carbidopa-levodopa (SINEMET IR) 25-100 MG tablet Take 1 tablet by mouth 3 (three) times daily.  . carvedilol (COREG) 6.25 MG tablet TAKE 1 TABLET BY MOUTH TWICE DAILY (Patient taking differently: Take 6.25 mg by mouth 2 (two) times daily with a meal. )  . clobetasol cream (TEMOVATE) 7.90 % Apply 1 application topically once a week. Apply medication once a week  . clonazePAM (KLONOPIN) 0.5 MG tablet Take 0.5 tablets (0.25 mg total) by mouth at bedtime.  . isosorbide mononitrate (IMDUR) 60 MG 24 hr tablet TAKE 1 TABLET BY MOUTH DAILY (Patient taking differently: Take 60 mg by mouth daily. )  . losartan (COZAAR) 25 MG  tablet TAKE 1 TABLET BY MOUTH EVERY DAY  . Multiple Vitamin (MULTIVITAMIN) tablet Take 1 tablet by mouth daily.    . nitroGLYCERIN (NITROSTAT) 0.4 MG SL tablet Place 1 tablet (0.4 mg total) under the tongue every 5 (five) minutes as needed for chest pain.  . Omega-3 Fatty Acids (FISH OIL) 1200 MG CAPS Take 1 capsule by mouth daily.   Marland Kitchen omeprazole (PRILOSEC) 20 MG capsule TAKE 1 CAPSULE BY MOUTH EVERY MORNING AS DIRECTED (Patient taking differently: Take 20 mg by mouth  every other day. )  . polyethylene glycol (MIRALAX / GLYCOLAX) packet Take 17 g by mouth daily.   . ranolazine (RANEXA) 1000 MG SR tablet TAKE 1 TABLET(1000 MG) BY MOUTH TWICE DAILY  . simvastatin (ZOCOR) 20 MG tablet TAKE 1 TABLET BY MOUTH EVERY NIGHT AT BEDTIME (Patient taking differently: Take 20 mg by mouth at bedtime. )  . tamsulosin (FLOMAX) 0.4 MG CAPS capsule Take 1 capsule (0.4 mg total) by mouth daily.  Marland Kitchen zolpidem (AMBIEN) 10 MG tablet TAKE 1 TABLET BY MOUTH EVERY NIGHT AT BEDTIME AS NEEDED FOR SLEEP  . [DISCONTINUED] clonazePAM (KLONOPIN) 0.5 MG tablet Take 0.5 tablets (0.25 mg total) by mouth at bedtime.  . [DISCONTINUED] hydrochlorothiazide (HYDRODIURIL) 25 MG tablet Take 25 mg by mouth every other day. Pt does not take on M W F   No facility-administered encounter medications on file as of 03/08/2018.     PAST MEDICAL HISTORY:   Past Medical History:  Diagnosis Date  . 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 Diabetes mellitus without complication ([place])   . History of cardiac catheterization 2011   [facility]  . Hyperlipidemia   . Hypertension   . Hypertriglyceridemia   . Parkinson's disease ([place])   . 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 Vertigo     PAST SURGICAL HISTORY:   Past Surgical History:  Procedure Laterality Date  . CARDIAC CATHETERIZATION  05-19-2010   [facility]: Patent grafts. LIMA to LAD, SVG to D1, OM1 and RPDA  . CORONARY ARTERY BYPASS GRAFT  03/1998   5 vessel, [facility]  . RIGHT/LEFT HEART  CATH AND CORONARY ANGIOGRAPHY N/A 02/01/2018   Procedure: RIGHT/LEFT HEART CATH AND CORONARY ANGIOGRAPHY;  Surgeon: Wellington Hampshire, MD;  Location: [facility];  Service: Cardiovascular;  Laterality: N/A;    SOCIAL HISTORY:   Social History   Socioeconomic History  . Marital status: Married    Spouse name: Not on file  . Number of children: Not on file  . Years of education: Not on file  . Highest education level: Not on file  Occupational History  . Occupation: retired    Comment: IT work  Scientific laboratory technician  . Financial resource strain: Not hard at all  . Food insecurity:    Worry: Never true    Inability: Never true  . Transportation needs:    Medical: No    Non-medical: No  Tobacco Use  . Smoking status: Never Smoker  . Smokeless tobacco: Never Used  Substance and Sexual Activity  . Alcohol use: Yes    Comment: occasional beer  . Drug use: No  . Sexual activity: Not Currently  Lifestyle  . Physical activity:    Days per week: Not on file    Minutes per session: Not on file  . Stress: Not on file  Relationships  . Social connections:    Talks on phone: Not on file    Gets together: Not on file    Attends religious service: Not on file    Active member of club or organization: Not on file    Attends meetings of clubs or organizations: Not on file    Relationship status: Not on file  . Intimate partner violence:    Fear of current or ex partner: No    Emotionally abused: No    Physically abused: No    Forced sexual activity: No  Other Topics Concern  . Not on file  Social History Narrative  . Not on file    FAMILY HISTORY:   Family Status  Relation Name Status  . Mother  Deceased  . Father  Deceased  . Brother  Alive  . Son x2 Alive    ROS: Review of Systems  Constitutional: Negative.   HENT: Negative.   Eyes: Negative.   Respiratory: Negative.   Cardiovascular: Negative.   Gastrointestinal: Negative.   Genitourinary: Negative.   Skin:  Negative.   Endo/Heme/Allergies: Negative.     PHYSICAL EXAMINATION:    VITALS:   Vitals:   03/08/18 0821  BP: 118/74  Pulse: 60  SpO2: 96%  Weight: 189 lb (85.7 kg)  Height: 5\' 7"  (1.702 m)     No data found.   GEN:  The patient appears stated age and is in NAD. HEENT:  Normocephalic, atraumatic.  The mucous membranes are moist. The superficial temporal arteries are without ropiness or tenderness. CV:  RRR Lungs:  CTAB Neck/HEME:  There are no carotid bruits bilaterally.  Neurological examination:  Orientation: The patient is alert and oriented x3. Cranial nerves: There is good facial symmetry. The speech is fluent and clear. Soft palate rises symmetrically and there is no tongue deviation. Hearing is intact to conversational tone. Sensation: Sensation is intact to light touch throughout Motor: Strength is 5/5 in the bilateral upper and lower extremities.   Shoulder shrug is equal and symmetric.  There is no pronator drift.   Movement examination: Tone: There is normal tone in the bilateral upper extremities.  The tone in the lower extremities is normal.  Abnormal movements: There is a rare right upper extremity resting tremor. Coordination:  There is no decremation with RAM's, with any form of RAMS, including alternating supination and pronation of the forearm, hand opening and closing, finger taps, heel taps and toe taps. Gait and Station: The patient has no difficulty arising out of a deep-seated chair without the use of the hands. The patient's stride length is normal with somewhat purposeful arm swing on the right (or slightly decreased on the L)  Lab Results  Component Value Date   WBC 3.8 01/14/2018   HGB 11.8 (L) 01/14/2018   HCT 33.8 (L) 01/14/2018   MCV 96 01/14/2018   PLT 162 01/14/2018   Lab Results  Component Value Date   VITAMINB12 575 08/06/2015     ASSESSMENT/PLAN:  1.  Idiopathic Parkinson's disease, tremor predominant.  Dx: 11/2015  -We  discussed that it used to be thought that levodopa would increase risk of melanoma but now it is believed that Parkinsons itself likely increases risk of melanoma. he is to get regular skin checks.  -He had been off of medication for 24 hours when I saw him previously.  While he certainly has a parkinsonian tremor, he still does not meet formal criteria for Parkinson's disease.  He and I discussed this again today.  However, he feels that he physically does better on levodopa.  We will continue carbidopa/levodopa 25/100, 1 tablet 3 times per day.   -He remains in rock study boxing and I encouraged him to continue that.  -We discussed community resources in the area including patient support groups and community exercise programs for PD and pt education was provided to the patient.    2.  Hx of HTN  -PD is really one true cure for HTN.  On several BP lowering agents and BP low in office today.  If able, may want to consider lowering meds.  3.  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  Pt states known fx years ago.  Will monitor clinically  4.  RLS and RBD  -restart klonopin - 0.5 mg - 1/2 tablet at night  5.  Episode of L sided shaking  -EEG in 02/2017 was normal.  No further episodes.  Doubt seizure.  6.  F/u 5 months  Cc:  [name], MD

## 2018-03-05 ENCOUNTER — Encounter: Payer: Self-pay | Admitting: Cardiovascular Disease

## 2018-03-05 ENCOUNTER — Ambulatory Visit (INDEPENDENT_AMBULATORY_CARE_PROVIDER_SITE_OTHER): Payer: Medicare Other | Admitting: Cardiovascular Disease

## 2018-03-05 VITALS — BP 130/60 | HR 50 | Ht 67.0 in | Wt 189.5 lb

## 2018-03-05 DIAGNOSIS — I2510 Atherosclerotic heart disease of native coronary artery without angina pectoris: Secondary | ICD-10-CM

## 2018-03-05 DIAGNOSIS — E785 Hyperlipidemia, unspecified: Secondary | ICD-10-CM | POA: Diagnosis not present

## 2018-03-05 DIAGNOSIS — I10 Essential (primary) hypertension: Secondary | ICD-10-CM | POA: Diagnosis not present

## 2018-03-05 DIAGNOSIS — I6523 Occlusion and stenosis of bilateral carotid arteries: Secondary | ICD-10-CM

## 2018-03-05 NOTE — Progress Notes (Signed)
Cardiology Office Note   Date:  03/05/2018   ID:  Johnathan Arnold., DOB Dec 20, 1945, MRN [id number]  PCP:  [name], MD  Cardiologist:   [name], MD   Chief Complaint  Patient presents with  . other    follow up post cath 02/01/2018. Medications reviewed verbally.      History of Present Illness: Johnathan Arnold. is a 72 y.o. male who presents for a followup visit.  He has known history of coronary artery disease status post CABG in 1999. He also has known history of hypertension, chronic kidney disease, hyperlipidemia and type 2 diabetes. He underwent a cardiac catheterization in 2011 which showed patent grafts. He continued to have mild exertional chest pain. A treadmill nuclear stress test in 06/2012 showed normal ejection fraction. Fixed inferior wall defect with normal wall motion and moderate reversibility in the septal wall.  Previous carotid Doppler showed mild nonobstructive bilateral disease.  He had worsening exertional dyspnea last year.  He underwent a treadmill Myoview and had mild 1 mm ST depression in the inferior and anterolateral leads with inability to reach target heart rate.  He was switched to [employer].  Perfusion showed mild anteroseptal ischemia with normal ejection fraction.  Echocardiogram showed an EF of 60 to 65% with normal diastolic function, mild mitral regurgitation and mild pulmonary hypertension with peak systolic pressure of 45 mmHg.  He had bilateral leg pain with exercise.  His vascular exam showed normal pulses.  He underwent lower extremity arterial Doppler last year that showed normal ABI and waveforms.    The patient continued to complain of significant exertional dyspnea.  I proceeded with a right and left cardiac catheterization in August which showed occluded native arteries with patent grafts including LIMA to LAD, SVG to diagonal, SVG to OM and SVG to distal RCA.  Right heart catheterization showed normal filling pressures,  mild pulmonary hypertension and normal cardiac output.  He has been doing reasonably well with no chest pain.  He reports stable exertional dyspnea.  Past Medical History:  Diagnosis Date  . 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 Diabetes mellitus without complication ([place])   . History of cardiac catheterization 2011   [facility]  . Hyperlipidemia   . Hypertension   . Hypertriglyceridemia   . Parkinson's disease ([place])   . 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 Vertigo     Past Surgical History:  Procedure Laterality Date  . CARDIAC CATHETERIZATION  05-19-2010   [facility]: Patent grafts. LIMA to LAD, SVG to D1, OM1 and RPDA  . CORONARY ARTERY BYPASS GRAFT  03/1998   5 vessel, [facility]  . RIGHT/LEFT HEART CATH AND CORONARY ANGIOGRAPHY N/A 02/01/2018   Procedure: RIGHT/LEFT HEART CATH AND CORONARY ANGIOGRAPHY;  Surgeon: Wellington Hampshire, MD;  Location: [facility];  Service: Cardiovascular;  Laterality: N/A;     Current Outpatient Medications  Medication Sig Dispense Refill  . aspirin 81 MG tablet Take 162 mg by mouth daily. Takes 2 tablets daily.    . carbidopa-levodopa (SINEMET IR) 25-100 MG tablet Take 1 tablet by mouth 3 (three) times daily. 270 tablet 1  . carvedilol (COREG) 6.25 MG tablet TAKE 1 TABLET BY MOUTH TWICE DAILY (Patient taking differently: Take 6.25 mg by mouth 2 (two) times daily with a meal. ) 180 tablet 2  . clobetasol cream (TEMOVATE) 8.58 % Apply 1 application topically once a week. Apply medication  once a week 30 g 2  . clonazePAM (KLONOPIN) 0.5 MG tablet Take 0.5 tablets (0.25 mg total) by mouth at bedtime. 45 tablet 0  . isosorbide mononitrate (IMDUR) 60 MG 24 hr tablet TAKE 1 TABLET BY MOUTH DAILY (Patient taking differently: Take 60 mg by mouth daily. ) 90 tablet 0  . losartan (COZAAR) 25 MG tablet TAKE 1 TABLET BY MOUTH EVERY DAY 90 tablet 1  . Multiple Vitamin (MULTIVITAMIN) tablet Take 1 tablet by mouth daily.      . nitroGLYCERIN (NITROSTAT)  0.4 MG SL tablet Place 1 tablet (0.4 mg total) under the tongue every 5 (five) minutes as needed for chest pain. 25 tablet 1  . Omega-3 Fatty Acids (FISH OIL) 1200 MG CAPS Take 1 capsule by mouth daily.     Marland Kitchen omeprazole (PRILOSEC) 20 MG capsule TAKE 1 CAPSULE BY MOUTH EVERY MORNING AS DIRECTED (Patient taking differently: Take 20 mg by mouth every other day. ) 90 capsule 1  . polyethylene glycol (MIRALAX / GLYCOLAX) packet Take 17 g by mouth daily.     . ranolazine (RANEXA) 1000 MG SR tablet TAKE 1 TABLET(1000 MG) BY MOUTH TWICE DAILY 60 tablet 3  . simvastatin (ZOCOR) 20 MG tablet TAKE 1 TABLET BY MOUTH EVERY NIGHT AT BEDTIME (Patient taking differently: Take 20 mg by mouth at bedtime. ) 90 tablet 0  . tamsulosin (FLOMAX) 0.4 MG CAPS capsule Take 1 capsule (0.4 mg total) by mouth daily. 30 capsule 6  . zolpidem (AMBIEN) 10 MG tablet TAKE 1 TABLET BY MOUTH EVERY NIGHT AT BEDTIME AS NEEDED FOR SLEEP 30 tablet 0   No current facility-administered medications for this visit.     Allergies:   Patient has no known allergies.    Social History:  The patient  reports that he has never smoked. He has never used smokeless tobacco. He reports that he drinks alcohol. He reports that he does not use drugs.   Family History:  The patient's family history includes Heart attack (age of onset: 69) in his father; Heart attack (age of onset: 52) in his mother; Heart disease in his brother and father; Hypertension in his mother.    ROS:  Please see the history of present illness.   Otherwise, review of systems are positive for none.   All other systems are reviewed and negative.    PHYSICAL EXAM: VS:  BP 130/60 (BP Location: Left Arm, Patient Position: Sitting, Cuff Size: Normal)   Pulse (!) 50   Ht 5\' 7"  (1.702 m)   Wt 189 lb 8 oz (86 kg)   BMI 29.68 kg/m  , BMI Body mass index is 29.68 kg/m. GEN: Well nourished, well developed, in no acute distress  HEENT: normal  Neck: no JVD, carotid bruits, or  masses Cardiac: RRR; no murmurs, rubs, or gallops,no edema  Respiratory:  clear to auscultation bilaterally, normal work of breathing GI: soft, nontender, nondistended, + BS MS: no deformity or atrophy  Skin: warm and dry, no rash Neuro:  Strength and sensation are intact Psych: euthymic mood, full affect Vascular: Femoral pulses are normal. Distal pulses are normal.  No groin hematoma  EKG:  EKG is ordered today. The ekg ordered today demonstrates sinus bradycardia with left atrial enlargement and old septal infarct.   Recent Labs: 09/21/2017: ALT 6 01/14/2018: Hemoglobin 11.8; Platelets 162 03/02/2018: BUN 20; Creatinine, Ser 2.11; Potassium 4.4; Sodium 138    Lipid Panel    Component Value Date/Time   CHOL 118 09/21/2017  0915   TRIG 89.0 09/21/2017 0915   HDL 62.30 09/21/2017 0915   CHOLHDL 2 09/21/2017 0915   VLDL 17.8 09/21/2017 0915   LDLCALC 38 09/21/2017 0915   LDLDIRECT 38.0 08/06/2015 1118      Wt Readings from Last 3 Encounters:  03/05/18 189 lb 8 oz (86 kg)  02/17/18 188 lb 12.8 oz (85.6 kg)  02/01/18 189 lb 12 oz (86.1 kg)        ASSESSMENT AND PLAN:  1.  Coronary artery disease involving native coronary arteries without angina: Most recent cardiac catheterization showed patent grafts.  Continue medical therapy.   2. Bilateral carotid artery disease:  Mild nonobstructive disease. Continue treatment of risk factors.  3. Hyperlipidemia: Continue treatment with simvastatin. Most recent LDL was 41  4. Essential hypertension: Blood pressure is well controlled on current medications  5. Exertional leg pain: This does not seem to be due to peripheral arterial disease.  6.  Chronic kidney disease: Slight worsening in renal function since cardiac catheterization.  I agree with stopping hydrochlorothiazide.  Disposition:   FU with me in 6 months  Signed,  [name], MD  03/05/2018 11:52 AM    [place]

## 2018-03-05 NOTE — Patient Instructions (Signed)
Medication Instructions: Your physician recommends that you continue on your current medications as directed. Please refer to the Current Medication list given to you today.  If you need a refill on your cardiac medications before your next appointment, please call your pharmacy.   Follow-Up: Your physician wants you to follow-up in 6 months with Dr. Arida. You will receive a reminder letter in the mail two months in advance. If you don't receive a letter, please call our office at [phone] to schedule this follow-up appointment.  Thank you for choosing [facility]!    

## 2018-03-08 ENCOUNTER — Encounter: Payer: Self-pay | Admitting: Neurology

## 2018-03-08 ENCOUNTER — Encounter

## 2018-03-08 ENCOUNTER — Ambulatory Visit (INDEPENDENT_AMBULATORY_CARE_PROVIDER_SITE_OTHER): Payer: Medicare Other | Admitting: Neurology

## 2018-03-08 VITALS — BP 118/74 | HR 60 | Ht 67.0 in | Wt 189.0 lb

## 2018-03-08 DIAGNOSIS — I6523 Occlusion and stenosis of bilateral carotid arteries: Secondary | ICD-10-CM | POA: Diagnosis not present

## 2018-03-08 DIAGNOSIS — G4752 REM sleep behavior disorder: Secondary | ICD-10-CM

## 2018-03-08 DIAGNOSIS — G20 Parkinson's disease: Secondary | ICD-10-CM

## 2018-03-08 MED ORDER — CLONAZEPAM 0.5 MG PO TABS: 0.2500 mg | ORAL_TABLET | Freq: Every day | ORAL | 1 refills | Status: DC

## 2018-03-08 NOTE — Patient Instructions (Signed)
You are looking great!  Keep up the exercise!

## 2018-03-10 DIAGNOSIS — C44519 Basal cell carcinoma of skin of other part of trunk: Secondary | ICD-10-CM | POA: Diagnosis not present

## 2018-03-16 ENCOUNTER — Other Ambulatory Visit: Payer: Self-pay | Admitting: Cardiovascular Disease

## 2018-03-25 ENCOUNTER — Ambulatory Visit: Payer: Self-pay | Admitting: Urology

## 2018-03-26 ENCOUNTER — Encounter: Payer: Medicare Other | Admitting: Internal Medicine

## 2018-03-31 ENCOUNTER — Other Ambulatory Visit: Payer: Self-pay | Admitting: Cardiovascular Disease

## 2018-04-15 ENCOUNTER — Ambulatory Visit (INDEPENDENT_AMBULATORY_CARE_PROVIDER_SITE_OTHER): Payer: Medicare Other | Admitting: Urology

## 2018-04-15 ENCOUNTER — Other Ambulatory Visit: Payer: Self-pay

## 2018-04-15 ENCOUNTER — Encounter: Payer: Self-pay | Admitting: Urology

## 2018-04-15 VITALS — BP 126/66 | HR 60 | Ht 66.0 in | Wt 190.0 lb

## 2018-04-15 DIAGNOSIS — Z125 Encounter for screening for malignant neoplasm of prostate: Secondary | ICD-10-CM

## 2018-04-15 DIAGNOSIS — N401 Enlarged prostate with lower urinary tract symptoms: Secondary | ICD-10-CM

## 2018-04-15 DIAGNOSIS — N475 Adhesions of prepuce and glans penis: Secondary | ICD-10-CM

## 2018-04-15 DIAGNOSIS — N480 Leukoplakia of penis: Secondary | ICD-10-CM | POA: Diagnosis not present

## 2018-04-15 DIAGNOSIS — I6523 Occlusion and stenosis of bilateral carotid arteries: Secondary | ICD-10-CM

## 2018-04-15 LAB — BLADDER SCAN AMB NON-IMAGING: SCAN RESULT: 0

## 2018-04-15 LAB — URINALYSIS, COMPLETE
Bilirubin, UA: NEGATIVE
Glucose, UA: NEGATIVE
Ketones, UA: NEGATIVE
Nitrite, UA: NEGATIVE
PH UA: 5.5 (ref 5.0–7.5)
RBC, UA: NEGATIVE
Specific Gravity, UA: 1.025 (ref 1.005–1.030)
Urobilinogen, Ur: 0.2 mg/dL (ref 0.2–1.0)

## 2018-04-15 LAB — MICROSCOPIC EXAMINATION
EPITHELIAL CELLS (NON RENAL): NONE SEEN /HPF (ref 0–10)
RBC, UA: NONE SEEN /hpf (ref 0–2)

## 2018-04-15 NOTE — Progress Notes (Signed)
04/15/2018 10:27 AM   Johnathan Arnold 02/17/1946 [id number]  Referring provider: [name], MD [place], [place] [address]  Chief Complaint  Patient presents with  . Benign Prostatic Hypertrophy    HPI: 72 year old male with a personal history of BPH, BXO who presents today for routine annual follow-up.  He was last seen a year ago by Dr. Junious Silk.  He is currently on Flomax started several years ago.  He has not tried any interval off of Flomax.  His symptoms are well controlled.  He does not recall why he got started on Flomax in the first place.  IPSS as below.  PVR minimal today.  UA unremarkable.  He does note that he is no longer using the cream on his penis on a regular basis.  He asked why his penis is shrinking today.  He denies any spraying or splitting of his urinary stream.  PSA/DRE screening due today.   IPSS    Row Name 04/15/18 0900         International Prostate Symptom Score   How often have you had the sensation of not emptying your bladder?  Not at All     How often have you had to urinate less than every two hours?  Not at All     How often have you found you stopped and started again several times when you urinated?  Less than 1 in 5 times     How often have you found it difficult to postpone urination?  Less than half the time     How often have you had a weak urinary stream?  Less than 1 in 5 times     How often have you had to strain to start urination?  Not at All     How many times did you typically get up at night to urinate?  2 Times     Total IPSS Score  6       Quality of Life due to urinary symptoms   If you were to spend the rest of your life with your urinary condition just the way it is now how would you feel about that?  Mixed        Score:  1-7 Mild 8-19 Moderate 20-35 Severe   PMH: Past Medical History:  Diagnosis Date  . 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 Diabetes  mellitus without complication ([place])   . History of cardiac catheterization 2011   [facility]  . Hyperlipidemia   . Hypertension   . Hypertriglyceridemia   . Parkinson's disease ([place])   . 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 Vertigo     Surgical History: Past Surgical History:  Procedure Laterality Date  . CARDIAC CATHETERIZATION  05-19-2010   [facility]: Patent grafts. LIMA to LAD, SVG to D1, OM1 and RPDA  . CORONARY ARTERY BYPASS GRAFT  03/1998   5 vessel, [facility]  . RIGHT/LEFT HEART CATH AND CORONARY ANGIOGRAPHY N/A 02/01/2018   Procedure: RIGHT/LEFT HEART CATH AND CORONARY ANGIOGRAPHY;  Surgeon: Wellington Hampshire, MD;  Location: [facility];  Service: Cardiovascular;  Laterality: N/A;    Home Medications:  Allergies as of 04/15/2018   No Known Allergies     Medication List        Accurate as of 04/15/18 10:27 AM. Always use your most recent med list.  aspirin 81 MG tablet Take 162 mg by mouth daily. Takes 2 tablets daily.   carbidopa-levodopa 25-100 MG tablet Commonly known as:  SINEMET IR Take 1 tablet by mouth 3 (three) times daily.   carvedilol 6.25 MG tablet Commonly known as:  COREG TAKE 1 TABLET BY MOUTH TWICE DAILY   clobetasol cream 0.05 % Commonly known as:  TEMOVATE Apply 1 application topically once a week. Apply medication once a week   clonazePAM 0.5 MG tablet Commonly known as:  KLONOPIN Take 0.5 tablets (0.25 mg total) by mouth at bedtime.   Fish Oil 1200 MG Caps Take 1 capsule by mouth daily.   isosorbide mononitrate 60 MG 24 hr tablet Commonly known as:  IMDUR TAKE 1 TABLET BY MOUTH DAILY   losartan 25 MG tablet Commonly known as:  COZAAR TAKE 1 TABLET BY MOUTH EVERY DAY   multivitamin tablet Take 1 tablet by mouth daily.   nitroGLYCERIN 0.4 MG SL tablet Commonly known as:  NITROSTAT Place 1 tablet (0.4 mg total) under the tongue every 5 (five) minutes as needed for chest pain.   omeprazole 20 MG capsule Commonly known as:   PRILOSEC TAKE 1 CAPSULE BY MOUTH EVERY MORNING AS DIRECTED   polyethylene glycol packet Commonly known as:  MIRALAX / GLYCOLAX Take 17 g by mouth daily.   ranolazine 1000 MG SR tablet Commonly known as:  RANEXA TAKE 1 TABLET(1000 MG) BY MOUTH TWICE DAILY   simvastatin 20 MG tablet Commonly known as:  ZOCOR TAKE 1 TABLET BY MOUTH EVERY NIGHT AT BEDTIME   tamsulosin 0.4 MG Caps capsule Commonly known as:  FLOMAX Take 1 capsule (0.4 mg total) by mouth daily.   zolpidem 10 MG tablet Commonly known as:  AMBIEN TAKE 1 TABLET BY MOUTH EVERY NIGHT AT BEDTIME AS NEEDED FOR SLEEP       Allergies: No Known Allergies  Family History: Family History  Problem Relation Age of Onset  . Heart attack Mother 11  . Hypertension Mother   . Heart attack Father 47  . Heart disease Father   . Heart disease Brother     Social History:  reports that he has never smoked. He has never used smokeless tobacco. He reports that he drinks alcohol. He reports that he does not use drugs.  ROS: UROLOGY Frequent Urination?: No Hard to postpone urination?: No Burning/pain with urination?: No Get up at night to urinate?: Yes Leakage of urine?: No Urine stream starts and stops?: No Trouble starting stream?: No Do you have to strain to urinate?: No Blood in urine?: No Urinary tract infection?: No Sexually transmitted disease?: No Injury to kidneys or bladder?: No Painful intercourse?: No Weak stream?: No Erection problems?: Yes Penile pain?: No  Gastrointestinal Nausea?: No Vomiting?: No Indigestion/heartburn?: No Diarrhea?: No Constipation?: Yes  Constitutional Fever: No Night sweats?: No Weight loss?: No Fatigue?: Yes  Skin Skin rash/lesions?: No Itching?: No  Eyes Blurred vision?: No Double vision?: No  Ears/Nose/Throat Sore throat?: No Sinus problems?: No  Hematologic/Lymphatic Swollen glands?: No Easy bruising?: No  Cardiovascular Leg swelling?: No Chest pain?:  No  Respiratory Cough?: No Shortness of breath?: No  Endocrine Excessive thirst?: No  Musculoskeletal Back pain?: No Joint pain?: No  Neurological Headaches?: No Dizziness?: No  Psychologic Depression?: No Anxiety?: No  Physical Exam: BP 126/66   Pulse 60   Ht 5\' 6"  (1.676 m)   Wt 190 lb (86.2 kg)   BMI 30.67 kg/m   Constitutional:  Alert and oriented,  No acute distress. HEENT: [place] AT, moist mucus membranes.  Trachea midline, no masses. Cardiovascular: No clubbing, cyanosis, or edema. Respiratory: Normal respiratory effort, no increased work of breathing. GI: Abdomen is soft, nontender, nondistended, no abdominal masses, fat pad appreciated. GU: Normal circumcised phallus.  Some adhesions on the coronal margin on the ventral aspect of the phallus.  Urethral meatus patent.  Slight sclerosis of the glans appreciated but not severe.  Bilateral descended testicles. Rectal: Slightly decreased sphincter tone.  Stool in rectal vault.  Prostamegaly, 50 cc prostate, nontender, no nodules. Skin: No rashes, bruises or suspicious lesions. Neurologic: Grossly intact, no focal deficits, moving all 4 extremities. Psychiatric: Normal mood and affect.  Laboratory Data: Lab Results  Component Value Date   WBC 3.8 01/14/2018   HGB 11.8 (L) 01/14/2018   HCT 33.8 (L) 01/14/2018   MCV 96 01/14/2018   PLT 162 01/14/2018    Lab Results  Component Value Date   CREATININE 2.11 (H) 03/02/2018    Lab Results  Component Value Date   PSA 1.08 08/01/2014   PSA 1.00 07/07/2013   PSA 1.42 06/23/2012     Lab Results  Component Value Date   HGBA1C 5.8 09/21/2017    Urinalysis    Component Value Date/Time   COLORURINE YELLOW (A) 01/19/2017 1907   APPEARANCEUR CLEAR (A) 01/19/2017 1907   APPEARANCEUR Clear 02/27/2015 1020   LABSPEC 1.008 01/19/2017 1907   LABSPEC 1.009 03/15/2014 1005   PHURINE 6.0 01/19/2017 1907   GLUCOSEU NEGATIVE 01/19/2017 1907   GLUCOSEU Negative  03/15/2014 1005   HGBUR NEGATIVE 01/19/2017 1907   BILIRUBINUR NEGATIVE 01/19/2017 1907   BILIRUBINUR Negative 02/27/2015 1020   BILIRUBINUR Negative 03/15/2014 1005   KETONESUR NEGATIVE 01/19/2017 1907   PROTEINUR NEGATIVE 01/19/2017 1907   UROBILINOGEN 1.0 03/16/2012 1630   NITRITE NEGATIVE 01/19/2017 1907   LEUKOCYTESUR TRACE (A) 01/19/2017 1907   LEUKOCYTESUR Negative 02/27/2015 1020   LEUKOCYTESUR Negative 03/15/2014 1005    Lab Results  Component Value Date   LABMICR See below: 02/27/2015   WBCUA 0-5 02/27/2015   RBCUA None seen 02/27/2015   LABEPIT None seen 02/27/2015   MUCUS Present (A) 02/27/2015   BACTERIA NONE SEEN 01/19/2017    Pertinent Imaging: Results for orders placed or performed in visit on 04/15/18  BLADDER SCAN AMB NON-IMAGING  Result Value Ref Range   Scan Result 0      Assessment & Plan:    1. Benign prostatic hyperplasia with lower urinary tract symptoms, symptom details unspecified Be unremarkable Adequate bladder emptying today Relatively asymptomatic We discussed trial of stopping Flomax for about a month to see if his symptoms worsen, resume as needed At this point time, symptoms are stable, he may follow-up as needed - Urinalysis, Complete - BLADDER SCAN AMB NON-IMAGING - PSA  2. 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 Prostate cancer screening PSA screening with DRE today which is unremarkable and PSA pending We discussed guidelines for continued PSA screening based on AUA guidelines, would consider cessation PSA screening today if his PSA is normal After shared decision making, patient is agreeable this plan   Return if  symptoms worsen or fail to improve.  Hollice Espy, MD  [facility]  [address], [place], [place] [address] [phone]

## 2018-04-16 ENCOUNTER — Ambulatory Visit: Payer: Self-pay

## 2018-04-16 ENCOUNTER — Telehealth: Payer: Self-pay | Admitting: Cardiovascular Disease

## 2018-04-16 ENCOUNTER — Telehealth: Payer: Self-pay

## 2018-04-16 DIAGNOSIS — R0789 Other chest pain: Secondary | ICD-10-CM | POA: Diagnosis not present

## 2018-04-16 DIAGNOSIS — R079 Chest pain, unspecified: Secondary | ICD-10-CM | POA: Diagnosis not present

## 2018-04-16 LAB — PSA: PROSTATE SPECIFIC AG, SERUM: 1 ng/mL (ref 0.0–4.0)

## 2018-04-16 NOTE — Telephone Encounter (Signed)
Pt decided to go to [facility]

## 2018-04-16 NOTE — Telephone Encounter (Signed)
I spoke with P[facility] & patient declined 4:45 appt on Monday. He decided to go to walk-in at [facility]. Just a FYI.

## 2018-04-16 NOTE — Telephone Encounter (Signed)
-----   Message from Hollice Espy, MD sent at 04/16/2018  8:38 AM EST ----- PSA is excellent, 1.0 and stable.  Hollice Espy, MD

## 2018-04-16 NOTE — Telephone Encounter (Signed)
Pt c/o of Chest Pain: STAT if CP now or developed within 24 hours  1. Are you having CP right now? Upper left side , pain if he moves around  2. Are you experiencing any other symptoms (ex. SOB, nausea, vomiting, sweating)? No other symtoms   3. How long have you been experiencing CP? Since yesterday morning.   4. Is your CP continuous or coming and going? Comes and goes based on how much he moves  5. Have you taken Nitroglycerin? no ?

## 2018-04-16 NOTE — Telephone Encounter (Signed)
Returned the call to the patient. He stated that he woke up yesterday with a sharp pain in the right side of his chest. The pain is only on exertion. He rates that pain a 6/10. This pain has been relieved some with tylenol.   The patient stated that he feels like he might have pulled something. The pain subsides when he keeps his arm still.  He stated that he is on his way to a walk in clinic for further assessment. He will call back if he needs anything else.

## 2018-04-16 NOTE — Telephone Encounter (Signed)
Called pt, no answers. LM for pt informing him of PSA results. Advised pt to call back for questions or concerns.

## 2018-04-16 NOTE — Telephone Encounter (Signed)
Patient called in with c/o "chest pain." He says "the pain started yesterday when I woke up to the upper right to the center of my chest. I have taken Tylenol and it helps some, but doesn't take it away completely. When I don't move, I don't have pain. As soon as I get up to move, the pain is a 6. It's not radiating anywhere, just staying on the right side. I wondered if it's a pulled muscle, but the only thing I did was go to bed." I asked about nausea, sweating, difficulty breathing, any other symptom, he denies other symptoms. According to protocol, see PCP within 24 hours, no availability with PCP or any provider in the office. I offered him to go to the [facility], he declined and says he will go to the [facility] today to see what is going on. I called the office to see if he could be worked in, but no openings today.   Reason for Disposition . [1] Chest pain lasts > 5 minutes AND [2] occurred > 3 days ago (72 hours) AND [3] NO chest pain or cardiac symptoms now  Answer Assessment - Initial Assessment Questions 1. LOCATION: "Where does it hurt?"       Upper right side to the center of chest 2. RADIATION: "Does the pain go anywhere else?" (e.g., into neck, jaw, arms, back)     No 3. ONSET: "When did the chest pain begin?" (Minutes, hours or days)      Yesterday morning when woke up 4. PATTERN "Does the pain come and go, or has it been constant since it started?"  "Does it get worse with exertion?"      Constant; no worse with exertion 5. DURATION: "How long does it last" (e.g., seconds, minutes, hours)     Constant 6. SEVERITY: "How bad is the pain?"  (e.g., Scale 1-10; mild, moderate, or severe)    - MILD (1-3): doesn't interfere with normal activities     - MODERATE (4-7): interferes with normal activities or awakens from sleep    - SEVERE (8-10): excruciating pain, unable to do any normal activities       If don't move, none. When moving around a 6-7 7. CARDIAC RISK  FACTORS: "Do you have any history of heart problems or risk factors for heart disease?" (e.g., prior heart attack, angina; high blood pressure, diabetes, being overweight, high cholesterol, smoking, or strong family history of heart disease)     Yes, bypass surgery in 2000; heart cath 2 months ago 8. PULMONARY RISK FACTORS: "Do you have any history of lung disease?"  (e.g., blood clots in lung, asthma, emphysema, birth control pills)     No 9. CAUSE: "What do you think is causing the chest pain?"     I don't know 10. OTHER SYMPTOMS: "Do you have any other symptoms?" (e.g., dizziness, nausea, vomiting, sweating, fever, difficulty breathing, cough)       No 11. PREGNANCY: "Is there any chance you are pregnant?" "When was your last menstrual period?"       N/A  Protocols used: CHEST PAIN-A-AH

## 2018-04-21 ENCOUNTER — Other Ambulatory Visit: Payer: Self-pay | Admitting: Cardiovascular Disease

## 2018-05-17 ENCOUNTER — Ambulatory Visit (INDEPENDENT_AMBULATORY_CARE_PROVIDER_SITE_OTHER): Payer: Medicare Other | Admitting: Internal Medicine

## 2018-05-17 ENCOUNTER — Encounter: Payer: Self-pay | Admitting: Internal Medicine

## 2018-05-17 VITALS — BP 146/70 | HR 49 | Temp 97.7°F | Resp 14 | Ht 66.0 in | Wt 190.6 lb

## 2018-05-17 DIAGNOSIS — M79605 Pain in left leg: Secondary | ICD-10-CM

## 2018-05-17 DIAGNOSIS — M79604 Pain in right leg: Secondary | ICD-10-CM

## 2018-05-17 DIAGNOSIS — E1121 Type 2 diabetes mellitus with diabetic nephropathy: Secondary | ICD-10-CM | POA: Diagnosis not present

## 2018-05-17 DIAGNOSIS — Z974 Presence of external hearing-aid: Secondary | ICD-10-CM | POA: Insufficient documentation

## 2018-05-17 DIAGNOSIS — N183 Chronic kidney disease, stage 3 unspecified: Secondary | ICD-10-CM

## 2018-05-17 DIAGNOSIS — I6523 Occlusion and stenosis of bilateral carotid arteries: Secondary | ICD-10-CM

## 2018-05-17 DIAGNOSIS — Z23 Encounter for immunization: Secondary | ICD-10-CM | POA: Diagnosis not present

## 2018-05-17 DIAGNOSIS — R918 Other nonspecific abnormal finding of lung field: Secondary | ICD-10-CM

## 2018-05-17 DIAGNOSIS — E7800 Pure hypercholesterolemia, unspecified: Secondary | ICD-10-CM | POA: Diagnosis not present

## 2018-05-17 LAB — COMPREHENSIVE METABOLIC PANEL
ALBUMIN: 4.5 g/dL (ref 3.5–5.2)
ALT: 22 U/L (ref 0–53)
AST: 20 U/L (ref 0–37)
Alkaline Phosphatase: 45 U/L (ref 39–117)
BUN: 17 mg/dL (ref 6–23)
CALCIUM: 9.5 mg/dL (ref 8.4–10.5)
CO2: 27 meq/L (ref 19–32)
Chloride: 105 mEq/L (ref 96–112)
Creatinine, Ser: 1.49 mg/dL (ref 0.40–1.50)
GFR: 49.09 mL/min — AB (ref >60.00)
Glucose, Bld: 115 mg/dL — ABNORMAL HIGH (ref 70–99)
POTASSIUM: 4.3 meq/L (ref 3.5–5.1)
Sodium: 139 mEq/L (ref 135–145)
Total Bilirubin: 0.8 mg/dL (ref 0.2–1.2)
Total Protein: 6.7 g/dL (ref 6.0–8.3)

## 2018-05-17 LAB — LIPID PANEL
CHOLESTEROL: 116 mg/dL (ref 0–200)
HDL: 53.2 mg/dL (ref >39.00)
LDL Cholesterol: 42 mg/dL (ref 0–99)
NonHDL: 62.86
Total CHOL/HDL Ratio: 2
Triglycerides: 102 mg/dL (ref 0.0–149.0)
VLDL: 20.4 mg/dL (ref 0.0–40.0)

## 2018-05-17 LAB — MICROALBUMIN / CREATININE URINE RATIO
CREATININE, U: 38.8 mg/dL
MICROALB/CREAT RATIO: 10.2 mg/g (ref 0.0–30.0)
Microalb, Ur: 3.9 mg/dL — ABNORMAL HIGH (ref 0.0–1.9)

## 2018-05-17 LAB — HEMOGLOBIN A1C: Hgb A1c MFr Bld: 5.6 % (ref 4.6–6.5)

## 2018-05-17 LAB — VITAMIN D 25 HYDROXY (VIT D DEFICIENCY, FRACTURES): VITD: 41.87 ng/mL (ref 30.00–100.00)

## 2018-05-17 MED ORDER — ZOSTER VAC RECOMB ADJUVANTED 50 MCG/0.5ML IM SUSR: 0.5000 mL | Freq: Once | INTRAMUSCULAR | 1 refills | Status: AC

## 2018-05-17 NOTE — Assessment & Plan Note (Signed)
72 yrs old.   Needs new ones

## 2018-05-17 NOTE — Patient Instructions (Addendum)
The goals for optimal blood pressure management are 130/80 or less .  Please check your blood pressure a few times at home and send me the readings so I can determine if you need a change in medication   You received your influenza vaccination today   The ShingRx vaccine is available in local pharmacies and is much more protective thant Zostavaxs,  It is therefore ADVISED for all interested adults over 50 to prevent shingles    Health Maintenance, Male A healthy lifestyle and preventive care is important for your health and wellness. Ask your health care provider about what schedule of regular examinations is right for you. What should I know about weight and diet? Eat a Healthy Diet  Eat plenty of vegetables, fruits, whole grains, low-fat dairy products, and lean protein.  Do not eat a lot of foods high in solid fats, added sugars, or salt.  Maintain a Healthy Weight Regular exercise can help you achieve or maintain a healthy weight. You should:  Do at least 150 minutes of exercise each week. The exercise should increase your heart rate and make you sweat (moderate-intensity exercise).  Do strength-training exercises at least twice a week.  Watch Your Levels of Cholesterol and Blood Lipids  Have your blood tested for lipids and cholesterol every 5 years starting at 73 years of age. If you are at high risk for heart disease, you should start having your blood tested when you are 73 years old. You may need to have your cholesterol levels checked more often if: ? Your lipid or cholesterol levels are high. ? You are older than 72 years of age. ? You are at high risk for heart disease.  What should I know about cancer screening? Many types of cancers can be detected early and may often be prevented. Lung Cancer  You should be screened every year for lung cancer if: ? You are a current smoker who has smoked for at least 30 years. ? You are a former smoker who has quit within the  past 15 years.  Talk to your health care provider about your screening options, when you should start screening, and how often you should be screened.  Colorectal Cancer  Routine colorectal cancer screening usually begins at 73 years of age and should be repeated every 5-10 years until you are 73 years old. You may need to be screened more often if early forms of precancerous polyps or small growths are found. Your health care provider may recommend screening at an earlier age if you have risk factors for colon cancer.  Your health care provider may recommend using home test kits to check for hidden blood in the stool.  A small camera at the end of a tube can be used to examine your colon (sigmoidoscopy or colonoscopy). This checks for the earliest forms of colorectal cancer.  Prostate and Testicular Cancer  Depending on your age and overall health, your health care provider may do certain tests to screen for prostate and testicular cancer.  Talk to your health care provider about any symptoms or concerns you have about testicular or prostate cancer.  Skin Cancer  Check your skin from head to toe regularly.  Tell your health care provider about any new moles or changes in moles, especially if: ? There is a change in a mole's size, shape, or color. ? You have a mole that is larger than a pencil eraser.  Always use sunscreen. Apply sunscreen liberally and repeat throughout  the day.  Protect yourself by wearing long sleeves, pants, a wide-brimmed hat, and sunglasses when outside.  What should I know about heart disease, diabetes, and high blood pressure?  If you are 71-59 years of age, have your blood pressure checked every 3-5 years. If you are 34 years of age or older, have your blood pressure checked every year. You should have your blood pressure measured twice-once when you are at a hospital or clinic, and once when you are not at a hospital or clinic. Record the average of the two  measurements. To check your blood pressure when you are not at a hospital or clinic, you can use: ? An automated blood pressure machine at a pharmacy. ? A home blood pressure monitor.  Talk to your health care provider about your target blood pressure.  If you are between 39-39 years old, ask your health care provider if you should take aspirin to prevent heart disease.  Have regular diabetes screenings by checking your fasting blood sugar level. ? If you are at a normal weight and have a low risk for diabetes, have this test once every three years after the age of 12. ? If you are overweight and have a high risk for diabetes, consider being tested at a younger age or more often.  A one-time screening for abdominal aortic aneurysm (AAA) by ultrasound is recommended for men aged 92-75 years who are current or former smokers. What should I know about preventing infection? Hepatitis B If you have a higher risk for hepatitis B, you should be screened for this virus. Talk with your health care provider to find out if you are at risk for hepatitis B infection. Hepatitis C Blood testing is recommended for:  Everyone born from [birth year] through 1966.  Anyone with known risk factors for hepatitis C.  Sexually Transmitted Diseases (STDs)  You should be screened each year for STDs including gonorrhea and chlamydia if: ? You are sexually active and are younger than 73 years of age. ? You are older than 73 years of age and your health care provider tells you that you are at risk for this type of infection. ? Your sexual activity has changed since you were last screened and you are at an increased risk for chlamydia or gonorrhea. Ask your health care provider if you are at risk.  Talk with your health care provider about whether you are at high risk of being infected with HIV. Your health care provider may recommend a prescription medicine to help prevent HIV infection.  What else can I do?  Schedule  regular health, dental, and eye exams.  Stay current with your vaccines (immunizations).  Do not use any tobacco products, such as cigarettes, chewing tobacco, and e-cigarettes. If you need help quitting, ask your health care provider.  Limit alcohol intake to no more than 2 drinks per day. One drink equals 12 ounces of beer, 5 ounces of wine, or 1 ounces of hard liquor.  Do not use street drugs.  Do not share needles.  Ask your health care provider for help if you need support or information about quitting drugs.  Tell your health care provider if you often feel depressed.  Tell your health care provider if you have ever been abused or do not feel safe at home. This information is not intended to replace advice given to you by your health care provider. Make sure you discuss any questions you have with your health care provider. Document  Released: 11/22/2007 Document Revised: 01/23/2016 Document Reviewed: 02/27/2015 Elsevier Interactive Patient Education  [employer].

## 2018-05-17 NOTE — Progress Notes (Signed)
Patient ID: Johnathan Arnold., male    DOB: Mar 31, 1946  Age: 72 y.o. MRN: [id number]  The patient is here for follow up and  and management of other chronic and acute problems.   Seeing Dermatology annually Since being diagnosed with Parkinsons' disease  particpating in   [place] this week for CKD   The risk factors are reflected in the social history.  The roster of all physicians providing medical care to patient - is listed in the Snapshot section of the chart.  Activities of daily living:  The patient is 100% independent in all ADLs: dressing, toileting, feeding as well as independent mobility  Home safety : The patient has smoke detectors in the home. They wear seatbelts.  There are no firearms at home. There is no violence in the home.   There is no risks for hepatitis, STDs or HIV. There is no   history of blood transfusion. They have no travel history to infectious disease endemic areas of the world.  The patient has seen their dentist in the last six month. They have seen their eye doctor in the last year. They admit to slight hearing difficulty with regard to whispered voices and some television programs.  They have deferred audiologic testing in the last year.  They do not  have excessive sun exposure. Discussed the need for sun protection: hats, long sleeves and use of sunscreen if there is significant sun exposure.   Diet: the importance of a healthy diet is discussed. They do have a healthy diet.  The benefits of regular aerobic exercise were discussed. he walks 4 times per week ,  20 minutes.   Depression screen: there are no signs or vegative symptoms of depression- irritability, change in appetite, anhedonia, sadness/tearfullness.  The following portions of the patient's history were reviewed and updated as appropriate: allergies, current medications, past family history, past medical history,  past surgical history, past social history  and problem  list.  Visual acuity was not assessed per patient preference since she has regular follow up with her ophthalmologist. Hearing and body mass index were assessed and reviewed.   During the course of the visit the patient was educated and counseled about appropriate screening and preventive services including : fall prevention , diabetes screening, nutrition counseling, colorectal cancer screening, and recommended immunizations.    CC: The primary encounter diagnosis was Abnormal findings on diagnostic imaging of lung. 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   1) Hypertension: patient checks blood pressure twice weekly at home.  Readings have been for the most part < 140/80 at rest . Patient is following a reduced salt diet most days and is taking medications as prescribed zanaflex and tramadol given .  2) Treated for right sided chest wall pain last month by [facility].  X ray normal.  Muscle spasm suspected.  3) abnormal x ray:  bilateral calcified nodules in the  lung apices at [facility] clinic , nonsmoker  4) CKD stage 3  Seeing Nephrology this  week  . 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   He has a past surgical history that includes Coronary artery bypass graft (03/1998); Cardiac catheterization (05-19-2010); and RIGHT/LEFT HEART CATH AND CORONARY  ANGIOGRAPHY (N/A, 02/01/2018).   His family history includes Heart attack (age of onset: 53) in his father; Heart attack (age of onset: 24) in his mother; Heart disease in his brother and father; Hypertension in his mother.He reports that he has never smoked. He has never used smokeless tobacco. He reports that he drinks alcohol. He reports that he does not use drugs.  Outpatient Medications Prior to Visit  Medication Sig Dispense Refill  . aspirin 81 MG tablet Take 162 mg by mouth daily. Takes 2 tablets daily.    . carbidopa-levodopa (SINEMET IR) 25-100 MG tablet Take 1 tablet by mouth 3 (three) times daily. 270 tablet 1  . carvedilol (COREG) 6.25 MG tablet TAKE 1 TABLET BY MOUTH TWICE DAILY 180 tablet 0  . clonazePAM (KLONOPIN) 0.5 MG tablet Take 0.5 tablets (0.25 mg total) by mouth at bedtime. 45 tablet 1  . isosorbide mononitrate (IMDUR) 60 MG 24 hr tablet TAKE 1 TABLET BY MOUTH DAILY 90 tablet 0  . losartan (COZAAR) 25 MG tablet TAKE 1 TABLET BY MOUTH EVERY DAY 90 tablet 1  . Multiple Vitamin (MULTIVITAMIN) tablet Take 1 tablet by mouth daily.      . nitroGLYCERIN (NITROSTAT) 0.4 MG SL tablet Place 1 tablet (0.4 mg total) under the tongue every 5 (five) minutes as needed for chest pain. 25 tablet 1  . Omega-3 Fatty Acids (FISH OIL) 1200 MG CAPS Take 1 capsule by mouth daily.     Marland Kitchen omeprazole (PRILOSEC) 20 MG capsule TAKE 1 CAPSULE BY MOUTH EVERY MORNING AS DIRECTED (Patient taking differently: Take 20 mg by mouth every other day. ) 90 capsule 1  . polyethylene glycol (MIRALAX / GLYCOLAX) packet Take 17 g by mouth daily.     . ranolazine (RANEXA) 1000 MG SR tablet TAKE 1 TABLET(1000 MG) BY MOUTH TWICE DAILY 180 tablet 3  . simvastatin (ZOCOR) 20 MG tablet TAKE 1 TABLET BY MOUTH EVERY NIGHT AT BEDTIME 90 tablet 0  . tamsulosin (FLOMAX) 0.4 MG CAPS capsule Take 1 capsule (0.4 mg total) by mouth daily. 30 capsule 6  . tiZANidine (ZANAFLEX) 2 MG tablet   11  . zolpidem (AMBIEN) 10 MG tablet TAKE  1 TABLET BY MOUTH EVERY NIGHT AT BEDTIME AS NEEDED FOR SLEEP 30 tablet 0  . clobetasol cream (TEMOVATE) 5.40 % Apply 1 application topically once a week. Apply medication once a week 30 g 2   No facility-administered medications prior to visit.     Review of Systems   Patient denies headache, fevers, malaise, unintentional weight loss, skin rash, eye pain, sinus congestion and sinus pain, sore throat, dysphagia,  hemoptysis , cough, dyspnea, wheezing, chest pain, palpitations, orthopnea, edema, abdominal pain, nausea, melena, diarrhea, constipation, flank pain, dysuria, hematuria, urinary  Frequency, nocturia, numbness, tingling, seizures,  Focal weakness, Loss of consciousness,  Tremor, insomnia, depression, anxiety, and suicidal ideation.      Objective:  BP (!) 146/70 (BP Location: Right Arm, Cuff Size: Normal)   Pulse (!) 49   Temp 97.7 F (36.5 C) (Oral)   Resp 14   Ht 5\' 6"  (1.676 m)   Wt 190 lb 9.6 oz (86.5 kg)   SpO2 98%   BMI 30.76  kg/m   Physical Exam   General appearance: alert, cooperative and appears stated age Ears: normal TM's and external ear canals both ears Throat: lips, mucosa, and tongue normal; teeth and gums normal Neck: no adenopathy, no carotid bruit, supple, symmetrical, trachea midline and thyroid not enlarged, symmetric, no tenderness/mass/nodules Back: symmetric, no curvature. ROM normal. No CVA tenderness. Lungs: clear to auscultation bilaterally Heart: regular rate and rhythm, S1, S2 normal, no murmur, click, rub or gallop Abdomen: soft, non-tender; bowel sounds normal; no masses,  no organomegaly Pulses: 2+ and symmetric Skin: Skin color, texture, turgor normal. No rashes or lesions Lymph nodes: Cervical, supraclavicular, and axillary nodes normal.    Assessment & Plan:   Problem List Items Addressed This Visit    CKD (chronic kidney disease) stage 3, GFR 30-59 ml/min (HCC)    Slight bump in CR post  procedure (right and left heart cath)  as  well as mild hyperkalemia have now resolved .   He follows up with nephrology this week  Lab Results  Component Value Date   CREATININE 1.49 05/17/2018   Lab Results  Component Value Date   NA 139 05/17/2018   K 4.3 05/17/2018   CL 105 05/17/2018   CO2 27 05/17/2018   Lab Results  Component Value Date   PTH 42 05/17/2018   CALCIUM 9.5 05/17/2018   CALCIUM 9.8 05/17/2018          Relevant Orders   Comprehensive metabolic panel (Completed)   VITAMIN D 25 Hydroxy (Vit-D Deficiency, Fractures) (Completed)   PTH, Intact and Calcium (Completed)   Hyperlipidemia   Relevant Orders   Lipid panel (Completed)   Leg pain, bilateral    Persistent,  Mild . Reviewed prior workup . Etiology does not appear to be presumed CKD or vitamin D .  Normal ABIs and toe indices in 2018. Secondary  to neuropathy.        Pulmonary nodules    Noted in the lung apices bilaterally. CT chest ordered       Wears hearing aid in both ears     72 yrs old.   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  Continue ASA, ARB, and statin   Lab Results  Component Value Date   HGBA1C 5.6 05/17/2018   Lab Results  Component Value Date   MICROALBUR 3.9 (H) 05/17/2018  '       Relevant Orders   Hemoglobin A1c (Completed)   Microalbumin / creatinine urine ratio (Completed)    Other Visit Diagnoses    Abnormal findings on diagnostic imaging of lung    -  Primary   Relevant Orders   CT Chest Wo Contrast   Abnormal chest x-ray with multiple lung nodules       Relevant Orders   CT Chest Wo Contrast   Encounter for immunization       Relevant Orders   Flu vaccine HIGH DOSE PF (Completed)    A total of 40 minutes was spent with patient more than half of which was spent in counseling patient on the above mentioned issues , reviewing and explaining recent labs and imaging studies done, and coordination of care.  I  have discontinued Jackson Latino Jr.'s clobetasol cream. I am also having him start on Zoster Vaccine Adjuvanted. Additionally, I am having him maintain his multivitamin, aspirin, Fish Oil, zolpidem,  polyethylene glycol, nitroGLYCERIN, omeprazole, tamsulosin, losartan, carbidopa-levodopa, clonazePAM, carvedilol, isosorbide mononitrate, ranolazine, simvastatin, and tiZANidine.  Meds ordered this encounter  Medications  . Zoster Vaccine Adjuvanted [facility]) injection    Sig: Inject 0.5 mLs into the muscle once for 1 dose.    Dispense:  1 each    Refill:  1    Medications Discontinued During This Encounter  Medication Reason  . clobetasol cream (TEMOVATE) 0.05 % Patient Preference    Follow-up: No follow-ups on file.   [name], MD

## 2018-05-18 DIAGNOSIS — R918 Other nonspecific abnormal finding of lung field: Secondary | ICD-10-CM | POA: Insufficient documentation

## 2018-05-18 LAB — PTH, INTACT AND CALCIUM
CALCIUM: 9.8 mg/dL (ref 8.6–10.3)
PTH: 42 pg/mL (ref 14–64)

## 2018-05-18 NOTE — Assessment & Plan Note (Addendum)
Persistent,  Mild . Reviewed prior workup . Etiology does not appear to be presumed CKD or vitamin D .  Normal ABIs and toe indices in 2018. Secondary  to neuropathy.

## 2018-05-18 NOTE — Assessment & Plan Note (Signed)
Slight bump in CR post  procedure (right and left heart cath)  as well as mild hyperkalemia have now resolved .   He follows up with nephrology this week  Lab Results  Component Value Date   CREATININE 1.49 05/17/2018   Lab Results  Component Value Date   NA 139 05/17/2018   K 4.3 05/17/2018   CL 105 05/17/2018   CO2 27 05/17/2018   Lab Results  Component Value Date   PTH 42 05/17/2018   CALCIUM 9.5 05/17/2018   CALCIUM 9.8 05/17/2018

## 2018-05-18 NOTE — Assessment & Plan Note (Signed)
  Remains well controlled  On diet alone.  Continue ASA, ARB, and statin   Lab Results  Component Value Date   HGBA1C 5.6 05/17/2018   Lab Results  Component Value Date   MICROALBUR 3.9 (H) 05/17/2018  '

## 2018-05-18 NOTE — Assessment & Plan Note (Signed)
Noted in the lung apices bilaterally. CT chest ordered

## 2018-05-20 DIAGNOSIS — I129 Hypertensive chronic kidney disease with stage 1 through stage 4 chronic kidney disease, or unspecified chronic kidney disease: Secondary | ICD-10-CM | POA: Diagnosis not present

## 2018-05-20 DIAGNOSIS — R600 Localized edema: Secondary | ICD-10-CM | POA: Diagnosis not present

## 2018-05-20 DIAGNOSIS — N183 Chronic kidney disease, stage 3 (moderate): Secondary | ICD-10-CM | POA: Diagnosis not present

## 2018-05-28 ENCOUNTER — Encounter: Payer: Self-pay | Admitting: Internal Medicine

## 2018-06-03 ENCOUNTER — Ambulatory Visit: Payer: Medicare Other

## 2018-06-10 ENCOUNTER — Other Ambulatory Visit: Payer: Self-pay | Admitting: *Deleted

## 2018-06-10 MED ORDER — RANOLAZINE ER 1000 MG PO TB12: ORAL_TABLET | ORAL | 3 refills | Status: DC

## 2018-06-25 ENCOUNTER — Telehealth: Payer: Self-pay | Admitting: *Deleted

## 2018-06-25 DIAGNOSIS — H8102 Meniere's disease, left ear: Secondary | ICD-10-CM | POA: Diagnosis not present

## 2018-06-25 DIAGNOSIS — H6123 Impacted cerumen, bilateral: Secondary | ICD-10-CM | POA: Diagnosis not present

## 2018-06-25 MED ORDER — ISOSORBIDE MONONITRATE ER 60 MG PO TB24: 60.0000 mg | ORAL_TABLET | Freq: Every day | ORAL | 0 refills | Status: DC

## 2018-06-25 MED ORDER — CARVEDILOL 6.25 MG PO TABS: 6.2500 mg | ORAL_TABLET | Freq: Two times a day (BID) | ORAL | 0 refills | Status: DC

## 2018-06-25 NOTE — Telephone Encounter (Signed)
Imdur 60 mg daily and Carvedilol 6.125 mg bid have been sent in for refills, per the patient request.

## 2018-07-01 ENCOUNTER — Ambulatory Visit
Admission: RE | Admit: 2018-07-01 | Discharge: 2018-07-01 | Disposition: A | Discharge status: Home / Self Care | Payer: Medicare Other | Source: Ambulatory Visit | Attending: Internal Medicine | Admitting: Internal Medicine

## 2018-07-01 DIAGNOSIS — R918 Other nonspecific abnormal finding of lung field: Secondary | ICD-10-CM | POA: Diagnosis not present

## 2018-07-06 ENCOUNTER — Other Ambulatory Visit: Payer: Self-pay | Admitting: Internal Medicine

## 2018-07-06 DIAGNOSIS — R918 Other nonspecific abnormal finding of lung field: Secondary | ICD-10-CM

## 2018-07-06 NOTE — Assessment & Plan Note (Signed)
Noted in the lung apices bilaterally. CT chest done. He has a 1.1 cm nodule in the LLL and multiple sub centimeter nodes on th right.  Referral to Pulmonology to discuss options of management

## 2018-07-09 ENCOUNTER — Encounter: Payer: Self-pay | Admitting: Pulmonary Disease

## 2018-07-09 ENCOUNTER — Ambulatory Visit (INDEPENDENT_AMBULATORY_CARE_PROVIDER_SITE_OTHER): Payer: Medicare Other | Admitting: Pulmonary Disease

## 2018-07-09 VITALS — BP 124/76 | HR 50 | Ht 66.0 in | Wt 191.0 lb

## 2018-07-09 DIAGNOSIS — R0600 Dyspnea, unspecified: Secondary | ICD-10-CM | POA: Diagnosis not present

## 2018-07-09 DIAGNOSIS — R911 Solitary pulmonary nodule: Secondary | ICD-10-CM | POA: Diagnosis not present

## 2018-07-09 DIAGNOSIS — R918 Other nonspecific abnormal finding of lung field: Secondary | ICD-10-CM

## 2018-07-09 NOTE — Patient Instructions (Signed)
1.  We will order a breathing test to evaluate your shortness of breath.  2.  We are ordering a PET/CT to evaluate your lung nodule.  3.  We will see you in follow-up after the above tests are done.

## 2018-07-09 NOTE — Progress Notes (Signed)
Subjective:    Patient ID: Johnathan Arnold., male    DOB: November 14, 1945, 73 y.o.   MRN: [id number]  HPI Patient is a 73 year old lifelong never smoker, who presents for evaluation of an abnormal CT scan of the chest.  He is kindly referred by Dr. Deborra Medina.  The patient notes that in November 2019, he developed a right shoulder pain that lasted several days.  He states that the pain was aggravated by raising the left arm but not the right.  This was not associated with radiation anywhere else.  He had no headache.  No cough or sputum production no hemoptysis.  He presented to the [facility] walk-in clinic where a chest x-ray was performed.  This showed no acute abnormality according to the initial evaluation however, was noted that he had biapical nodules.  Because of this he underwent a CT scan of the chest that was performed on 01 July 2018.  This CT scan showed a 1.1 cm nodule on the LEFT lower lobe at the costo-diaphragmatic recess.  Patient also had some incidental nodules on the RIGHT that were less than 5 mm in diameter.  I have reviewed these films independently.  I also reviewed the x-ray films back to 1999 however only reports are available.  None of the reports mention a lesion and the initial chest x-ray film that the patient had prior to CT did not show the lesion in this area either.  This is likely due to the position of it at the costo-diaphragmatic recess.  The patient does not describe any symptoms with regards to this nodule.  He has not had any cough as noted above no sputum production no hemoptysis.  He has had dyspnea on exertion of longstanding that he has attributed to deconditioning and issues with prior CABG and CAD.  He has not had any fevers, chills or sweats.  Does not describe any orthopnea or paroxysmal nocturnal dyspnea he does have mild peripheral edema towards the end of the day.  Not describe any gastroesophageal reflux symptoms no dysphasia.  He does have mild resting  tremor associated with Parkinson's.  His right shoulder pain has resolved.  The patient's past medical history, surgical history, family history have been reviewed and these are as noted.  Social history is as follows: He is a lifelong never smoker.  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  He has not had any asbestos exposure that he is aware of.  He served in the [employer] for approximately 4 years during the [place] Era.  He served in the [employer] and was stationed in [place] at the [employer].  He did not go overseas.  He has not had any recent overseas travel.  He has no pets in the home.  No usual hobbies.   Review of Systems  Constitutional: Negative.   HENT: Negative.   Eyes: Negative.   Respiratory: Positive for shortness of breath (Exertional).   Cardiovascular: Positive for leg swelling (Mild, evenings).  Gastrointestinal: Negative.   Endocrine: Negative.   Genitourinary: Negative.   Musculoskeletal: Negative.   Skin: Negative.   Neurological: Positive for tremors (Parkinson's).  Hematological: Negative.   Psychiatric/Behavioral: Negative.   All other systems reviewed and are negative.      Objective:   Physical Exam Vitals signs and nursing note reviewed.  Constitutional:      Appearance: Normal appearance. He is overweight. He is not ill-appearing or toxic-appearing.  HENT:  Head: Normocephalic and atraumatic.     Right Ear: External ear normal.     Left Ear: External ear normal.     Nose: Nose normal.     Mouth/Throat:     Mouth: Mucous membranes are moist.     Pharynx: Oropharynx is clear. No oropharyngeal exudate.  Eyes:     General: No scleral icterus.    Extraocular Movements: Extraocular movements intact.     Conjunctiva/sclera: Conjunctivae normal.     Pupils: Pupils are equal, round, and reactive to light.  Neck:     Musculoskeletal: Normal range of motion and neck supple.  Cardiovascular:     Rate and Rhythm:  Normal rate and regular rhythm.     Pulses: Normal pulses.     Heart sounds: Normal heart sounds.  Pulmonary:     Effort: Pulmonary effort is normal.     Breath sounds: Normal breath sounds.  Abdominal:     General: Abdomen is protuberant. There is no distension.     Palpations: Abdomen is soft.  Musculoskeletal: Normal range of motion.  Lymphadenopathy:     Cervical: No cervical adenopathy.  Skin:    General: Skin is warm and dry.  Neurological:     General: No focal deficit present.     Mental Status: He is alert and oriented to person, place, and time.     Motor: Tremor (Pill-rolling tremor right hand) present.  Psychiatric:        Mood and Affect: Mood normal.        Behavior: Behavior normal.        Thought Content: Thought content normal.        Judgment: Judgment normal.    X-ray data has been reviewed.  Film as noted below    On the LEFT costodiaphragmatic recess a 1.1 cm lesion can be seen as noted above.     Assessment & Plan:   1.  Abnormal CT scan of the chest: The patient shows a lesion on the left costodiaphragmatic recess.  I have advised him that this is an area very difficult to biopsy but that it can be attempted with navigational bronchoscopy.  However, it is uncertain how long this lesion has been present as it was not picked up on plain chest films and review of films dating back to 1999 do not mention the abnormality.  The patient has considerable anxiety with regards to this finding, will proceed with obtaining PET/CT if the lesion is PET/CT avid we will proceed with biopsy with navigational assistance.  This would require that the patient be under general anesthesia with higher levels of PEEP to try to prevent diaphragmatic excursion and be able to biopsy the lesion.  The patient is agreeable with the plan of obtaining PET/CT first and then determining on biopsy pending this result.  Should he require navigational bronchoscopy he will need repeat CT with  super dimension protocol for 3D reconstruction of his tracheobronchial tree.  2.  Dyspnea on exertion: Will obtain full PFTs to evaluate this issue.  Particularly since biopsy under general anesthesia may be necessary.  3.  Coronary artery disease, status post CABG: This issue adds complexity to his management.  He underwent cardiac catheterization on 01 February 2018 showing that all the grafts were patent.  Last 2D echo October 2018 showed normal left ventricular ejection fraction, normal diastolic function, mild mitral regurgitation, mild atrial dilatation and very mild elevation of the pulmonary artery pressure.  This was also confirmed by  right heart cath in August.  4.  Parkinson's disease: This issue adds complexity to his management.  This will need to be taken into account if they require general anesthesia for procedures.  [facility].  The patient will be seen in follow-up after the above testing is performed.  Thank you for allowing me to participate in this gentleman's care.

## 2018-07-13 DIAGNOSIS — D2261 Melanocytic nevi of right upper limb, including shoulder: Secondary | ICD-10-CM | POA: Diagnosis not present

## 2018-07-13 DIAGNOSIS — X32XXXA Exposure to sunlight, initial encounter: Secondary | ICD-10-CM | POA: Diagnosis not present

## 2018-07-13 DIAGNOSIS — L821 Other seborrheic keratosis: Secondary | ICD-10-CM | POA: Diagnosis not present

## 2018-07-13 DIAGNOSIS — D2271 Melanocytic nevi of right lower limb, including hip: Secondary | ICD-10-CM | POA: Diagnosis not present

## 2018-07-13 DIAGNOSIS — Z85828 Personal history of other malignant neoplasm of skin: Secondary | ICD-10-CM | POA: Diagnosis not present

## 2018-07-13 DIAGNOSIS — D2262 Melanocytic nevi of left upper limb, including shoulder: Secondary | ICD-10-CM | POA: Diagnosis not present

## 2018-07-13 DIAGNOSIS — D225 Melanocytic nevi of trunk: Secondary | ICD-10-CM | POA: Diagnosis not present

## 2018-07-13 DIAGNOSIS — L570 Actinic keratosis: Secondary | ICD-10-CM | POA: Diagnosis not present

## 2018-07-14 ENCOUNTER — Telehealth: Payer: Self-pay | Admitting: Urology

## 2018-07-14 ENCOUNTER — Other Ambulatory Visit: Payer: Self-pay | Admitting: *Deleted

## 2018-07-14 DIAGNOSIS — N401 Enlarged prostate with lower urinary tract symptoms: Secondary | ICD-10-CM

## 2018-07-14 MED ORDER — TAMSULOSIN HCL 0.4 MG PO CAPS: 0.4000 mg | ORAL_CAPSULE | Freq: Every day | ORAL | 6 refills | Status: DC

## 2018-07-14 NOTE — Telephone Encounter (Signed)
Pt needs refill for Tamsulosin and also wants it sent to new pharmacy, [facility] on [address].

## 2018-07-14 NOTE — Telephone Encounter (Signed)
Refill sent to [facility]

## 2018-07-15 ENCOUNTER — Ambulatory Visit (HOSPITAL_COMMUNITY): Payer: Medicare Other

## 2018-07-15 DIAGNOSIS — K5730 Diverticulosis of large intestine without perforation or abscess without bleeding: Secondary | ICD-10-CM | POA: Diagnosis not present

## 2018-07-15 DIAGNOSIS — N400 Enlarged prostate without lower urinary tract symptoms: Secondary | ICD-10-CM | POA: Diagnosis not present

## 2018-07-15 DIAGNOSIS — R918 Other nonspecific abnormal finding of lung field: Secondary | ICD-10-CM | POA: Diagnosis not present

## 2018-07-15 DIAGNOSIS — Z951 Presence of aortocoronary bypass graft: Secondary | ICD-10-CM | POA: Diagnosis not present

## 2018-07-15 DIAGNOSIS — R911 Solitary pulmonary nodule: Secondary | ICD-10-CM | POA: Diagnosis not present

## 2018-07-15 DIAGNOSIS — I2510 Atherosclerotic heart disease of native coronary artery without angina pectoris: Secondary | ICD-10-CM | POA: Diagnosis not present

## 2018-07-15 DIAGNOSIS — R0600 Dyspnea, unspecified: Secondary | ICD-10-CM | POA: Diagnosis not present

## 2018-07-15 DIAGNOSIS — I517 Cardiomegaly: Secondary | ICD-10-CM | POA: Diagnosis not present

## 2018-07-15 MED ORDER — ALBUTEROL SULFATE (2.5 MG/3ML) 0.083% IN NEBU
2.5000 mg | INHALATION_SOLUTION | Freq: Once | RESPIRATORY_TRACT | Status: AC
  Administered 2018-07-15: 2.5 mg via RESPIRATORY_TRACT
  Filled 2018-07-15: qty 3

## 2018-07-16 ENCOUNTER — Ambulatory Visit
Admission: RE | Admit: 2018-07-16 | Discharge: 2018-07-16 | Disposition: A | Discharge status: Home / Self Care | Payer: Medicare Other | Source: Ambulatory Visit | Attending: Pulmonary Disease | Admitting: Pulmonary Disease

## 2018-07-16 DIAGNOSIS — R918 Other nonspecific abnormal finding of lung field: Secondary | ICD-10-CM

## 2018-07-16 DIAGNOSIS — I517 Cardiomegaly: Secondary | ICD-10-CM | POA: Diagnosis not present

## 2018-07-16 DIAGNOSIS — Z951 Presence of aortocoronary bypass graft: Secondary | ICD-10-CM | POA: Insufficient documentation

## 2018-07-16 DIAGNOSIS — R911 Solitary pulmonary nodule: Secondary | ICD-10-CM

## 2018-07-16 DIAGNOSIS — R0600 Dyspnea, unspecified: Secondary | ICD-10-CM | POA: Insufficient documentation

## 2018-07-16 DIAGNOSIS — K5730 Diverticulosis of large intestine without perforation or abscess without bleeding: Secondary | ICD-10-CM | POA: Diagnosis not present

## 2018-07-16 DIAGNOSIS — N400 Enlarged prostate without lower urinary tract symptoms: Secondary | ICD-10-CM | POA: Diagnosis not present

## 2018-07-16 DIAGNOSIS — I2510 Atherosclerotic heart disease of native coronary artery without angina pectoris: Secondary | ICD-10-CM | POA: Diagnosis not present

## 2018-07-16 LAB — GLUCOSE, CAPILLARY: GLUCOSE-CAPILLARY: 102 mg/dL — AB (ref 70–99)

## 2018-07-16 MED ORDER — LOSARTAN POTASSIUM 25 MG PO TABS: 25.0000 mg | ORAL_TABLET | Freq: Every day | ORAL | 1 refills | Status: DC

## 2018-07-16 MED ORDER — FLUDEOXYGLUCOSE F - 18 (FDG) INJECTION
10.3800 | Freq: Once | INTRAVENOUS | Status: AC | PRN
  Administered 2018-07-16: 10.38 via INTRAVENOUS

## 2018-07-22 MED ORDER — CARBIDOPA-LEVODOPA 25-100 MG PO TABS: 1.0000 | ORAL_TABLET | Freq: Three times a day (TID) | ORAL | 0 refills | Status: DC

## 2018-07-28 ENCOUNTER — Ambulatory Visit (INDEPENDENT_AMBULATORY_CARE_PROVIDER_SITE_OTHER): Payer: Medicare Other | Admitting: Pulmonary Disease

## 2018-07-28 ENCOUNTER — Encounter: Payer: Self-pay | Admitting: Pulmonary Disease

## 2018-07-28 VITALS — BP 122/62 | HR 54 | Ht 66.0 in | Wt 191.4 lb

## 2018-07-28 DIAGNOSIS — G4719 Other hypersomnia: Secondary | ICD-10-CM | POA: Diagnosis not present

## 2018-07-28 DIAGNOSIS — G20 Parkinson's disease: Secondary | ICD-10-CM | POA: Diagnosis not present

## 2018-07-28 DIAGNOSIS — R911 Solitary pulmonary nodule: Secondary | ICD-10-CM

## 2018-07-28 DIAGNOSIS — R0600 Dyspnea, unspecified: Secondary | ICD-10-CM | POA: Diagnosis not present

## 2018-07-28 NOTE — Patient Instructions (Addendum)
1. A sleep study will be scheduled for you.  2. Your lung study was relatively normal.   3. We will see you in follow up in 4-6 weeks time.

## 2018-07-28 NOTE — Progress Notes (Signed)
Subjective:    Patient ID: Johnathan Boike., male    DOB: Jun 20, 1945, 73 y.o.   MRN: [id number]  HPI Patient is a 73 year old lifelong never smoker who presents for follow-up of an abnormal CT scan of the chest as well as issues with dyspnea.  He has underlying Parkinson's disease.  A CT scan of the chest was performed on 01 July 2018 it showed 1.1 cm elongated nodule in the left lower lobe at the costal diaphragmatic recess.  This is an area that is very difficult to biopsy due to the location and proximity to the diaphragm.  The patient was evaluated initially on 31 January.  He also complained at that time of significant dyspnea and fatigue.  He underwent pulmonary function testing on 6 February which was essentially unremarkable.  Also underwent PET/CT on 7 February which, by report, it showedvery low-grade activity on this left lower lobe nodule.  I have reviewed the films independently and the lesion appears to have no more activity than background.  Nevertheless it needs to be followed.    The patient today presents looking somewhat fatigued.  He has had issues with nonrestorative sleep.  The wife does describe that the patient has jerking movements in the middle of the night.  He has been noted to have restless legs even at daytime this may be related to his Parkinson's.  Otherwise voices no other complaints except as noted previously.   Review of Systems  Constitutional: Positive for fatigue. Negative for appetite change and unexpected weight change.  HENT: Negative.   Eyes: Negative.   Respiratory: Positive for shortness of breath.   Cardiovascular: Negative.   Gastrointestinal: Negative.   Endocrine: Negative.   Genitourinary: Negative.   Musculoskeletal: Negative.   Skin: Negative.   Allergic/Immunologic: Negative.   Neurological: Positive for tremors.  Hematological: Negative.   Psychiatric/Behavioral: Positive for sleep disturbance.  All other systems reviewed and are  negative.      Objective:   Physical Exam Vitals signs and nursing note reviewed.  Constitutional:      General: He is not in acute distress.    Appearance: He is well-developed.     Comments: Appears fatigued  HENT:     Head: Normocephalic and atraumatic.     Mouth/Throat:     Mouth: Mucous membranes are moist.     Pharynx: Oropharynx is clear.  Eyes:     Extraocular Movements: Extraocular movements intact.  Neck:     Musculoskeletal: Neck supple.     Thyroid: No thyromegaly.     Vascular: No JVD.     Trachea: No tracheal deviation.  Cardiovascular:     Rate and Rhythm: Normal rate and regular rhythm.     Pulses: Normal pulses.     Heart sounds: Normal heart sounds.  Pulmonary:     Effort: Pulmonary effort is normal.     Breath sounds: Normal breath sounds.  Abdominal:     General: There is no distension.  Musculoskeletal: Normal range of motion.     Right lower leg: No edema.     Left lower leg: No edema.  Lymphadenopathy:     Cervical: No cervical adenopathy.  Skin:    General: Skin is warm and dry.  Neurological:     General: No focal deficit present.     Mental Status: He is alert and oriented to person, place, and time.     Motor: Tremor (Pill-rolling) present.  Psychiatric:  Mood and Affect: Affect is flat.    PET/CT images are as below:       Assessment & Plan:   1.  Sleep disturbance: The patient is exhibiting sleep disturbance that may be related to restless leg syndrome as his other issues.  He is having excessive daytime sleepiness and disrupted sleep.  I suspect that this is the cause of his excessive fatigue during the daytime.  We will proceed with obtaining a sleep study to better evaluate this.  He may need input from his neurologist in [place] in this regard.  We will see him in follow-up after the sleep study is done.  2.  Lung nodule: The nodule is an area which is very difficult to biopsy which is the left costo-diaphragmatic  recess.  The PET/CT did not show increased activity over baseline.  I have discussed with the patient that this needs to be followed and we will recommend follow-up CT in 4 months time.    3.  Dyspnea: I suspect this is mostly due to fatigue, from the issues noted above.  Patient also has significant cardiac disease and it may be related to this.  His pulmonary function testing was essentially normal.  4.  Parkinson's disease: This issue adds complexity to his management.  5.  Coronary artery disease: This issue adds complexity to his management.

## 2018-07-29 ENCOUNTER — Encounter: Payer: Self-pay | Admitting: Pulmonary Disease

## 2018-08-02 ENCOUNTER — Other Ambulatory Visit: Payer: Self-pay | Admitting: *Deleted

## 2018-08-02 MED ORDER — SIMVASTATIN 20 MG PO TABS: 20.0000 mg | ORAL_TABLET | Freq: Every day | ORAL | 3 refills | Status: DC

## 2018-08-04 NOTE — Progress Notes (Signed)
Johnathan Arnold. was seen today in the movement disorders clinic for neurologic consultation at the request of [name], MD.  This patient is accompanied in the office by his wife who supplements the history.  The consultation is for the evaluation of PD.  Pt currently under the care of Dr Manuella Ghazi and I have reviewed his records.  Began to see Dr. Manuella Ghazi in June, 2017.  His first symptom was right arm tremor.  By the time he began to see Dr. Manuella Ghazi in June, 2017 he had tremor in both hands.  He was started on carbidopa/levodopa 25/100, one tablet 3 times per day in June, 2017. Pt states that it did help tremor. He last saw Dr. Brigitte Pulse on 09/02/2016.  His levodopa was increased to 1-1/2 tablets 3 times per day (6-8am/1-2pm/6-7pm).  He cannot tell when the med wears off.   Specific Symptoms:  Tremor: Yes.  , mostly R hand at rest.   Family hx of similar:  No. (grandfather with tremor) Voice: maybe slight decrease Sleep: sleeps well  Vivid Dreams:  Yes.    Acting out dreams:  Yes.   (screams, has fallen out of bed) Wet Pillows: just a little Postural symptoms:  No.  Falls?  No. Bradykinesia symptoms: slight drag of the R leg Loss of smell: has never had great sense of smell Loss of taste:  No. Urinary Incontinence:  No. Difficulty Swallowing:  No. Handwriting, micrographia: Yes.   Trouble with ADL's:  No. (does brace self when putting on pants)  Trouble buttoning clothing: No. Depression:  No. Memory changes:  Some naming trouble (people) Hallucinations:  No.  visual distortions: No. N/V:  No. Lightheaded:  No. 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 Diplopia:  No. Dyskinesia:  No.  Patient had an MRI of the brain on 01/15/2015.  I had the opportunity to review this.  There was very mild small vessel disease.  Enhancing 11 x 13 mm cross-section soft tissue abnormality in the  RIGHT lateral mastoid. Cholesteatoma not excluded.   02/12/17 update:  Patient seen  today in follow-up for Parkinson's disease.  This patient is accompanied in the office by his spouse who supplements the history.   I asked him to hold his levodopa today, since I have never seen him off of levodopa.  He has been off of it for 24 hours.  He generally is on carbidopa/levodopa 25/100, 1.5 tablets 3 times per day.  I have reviewed records since our last visit.  He was in the emergency room on 01/19/2017 with complaints of shaking.  He felt that this was worse than his usual shaking and on the opposite side.  Workup was unremarkable in the emergency room.   Pt states that he was up in [place] with friends.  He felt like he was getting vertigo.  He was getting nauseated.  He took a valium for the vertigo.  He drove home.  He went to get out of the car and he was shaking on the L arm and leg.  Friends assisted him into the house.  Both legs felt weak but only the L was shaking and it only shook when he stood.  The shaking lasted minutes and seemed to go away when he laid down on the couch.  He went to sleep.  When he woke up he decided to get checked out.  He went to [facility] few weeks ago and sons asked  him to see another neurologist and they recommended neupro.  Pt didn't start that medication but has some samples.  One episode dizziness getting out of bed because got up too quickly.   He does c/o RLS.  That seems to come and go.  Wife notices him flailing around in sleep and its disturbing her sleep.    08/20/17 update: Patient was seen today as a work in.  He is accompanied by his spouse who supplements the history.  I have not seen him in about 6 months.  He states that he has been "playing with the carbidopa/levodopa 25/100" and was taking it around breakfast/lunch/dinner.  He is starting to take q 6 hours.  Thought that would improve tremor (wondered if overmedicating).  Noting some more tremor on the L side (not just the right).  Was previously on carbidopa/levodopa 25/100, 1-1/2 tablets 3 times  per day.  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  It helped but he d/c it as sx's went away.  He feels good still.  Pt denies falls.  Pt denies lightheadedness, near syncope.  No hallucinations.  Mood has been good.  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  Some word finding and memory trouble. Had nl EEG since last visit.  The records that were made available to me were reviewed.  Had flu in Jan.    03/08/18 update: Patient is seen today in follow-up for parkinsonism.  He is accompanied by his wife who supplements history.  Patient is currently on carbidopa/levodopa 25/100, 1 tablet 3 times per day.  No falls since last visit.  No lightheadedness or near syncope.  He is acting out the dreams.  This morning, he was pulling on his wifes arm.  He is not taking the klonopin any longer.  He is going to [facility] 3 days per week.  Records are reviewed since our last visit.  He was in the hospital at the end of August for heart catheterization.  Following catheterization, continued medical therapy was recommended.  08/09/18 update: Patient is seen today in follow-up for parkinsonism.  He is accompanied by his wife who supplements the history.  Patient is on carbidopa/levodopa 25/100, 1 tablet 3 times per day.  Little more tremor.  Handwriting is a little more small.   He has had no falls.  No lightheadedness or near syncope.  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  Last visit, I started him on clonazepam, 0.25 mg, because of acting out of the dreams.  He is no longer having RLS when he goes to bed but he has it when he wakes up at 5am.  Wife states that he is still hitting her at night with acting out the dreams.  Have reviewed other records since our last visit.  Last saw primary care in May 17, 2018.  No changes were made to his regimen.  He saw pulmonary on July 28, 2018 due to a lung nodule, which is just being followed with serial CT.  A sleep study was ordered.  This is being done on  3/10 in [place].    PREVIOUS MEDICATIONS: Sinemet, klonopin (helped but pt d/c as sx's were better)  ALLERGIES:  No Known Allergies  CURRENT MEDICATIONS:  Outpatient Encounter Medications as of 08/09/2018  Medication Sig  . aspirin 81 MG tablet Take 162 mg by mouth daily. Takes 2 tablets daily.  . carbidopa-levodopa (SINEMET IR) 25-100 MG tablet Take 1 tablet by mouth 3 (three) times daily.  Marland Kitchen  carvedilol (COREG) 6.25 MG tablet Take 1 tablet (6.25 mg total) by mouth 2 (two) times daily.  . clonazePAM (KLONOPIN) 0.5 MG tablet Take 1 tablet (0.5 mg total) by mouth at bedtime.  . isosorbide mononitrate (IMDUR) 60 MG 24 hr tablet Take 1 tablet (60 mg total) by mouth daily.  Marland Kitchen losartan (COZAAR) 25 MG tablet Take 1 tablet (25 mg total) by mouth daily.  . Multiple Vitamin (MULTIVITAMIN) tablet Take 1 tablet by mouth daily.    . Omega-3 Fatty Acids (FISH OIL) 1200 MG CAPS Take 1 capsule by mouth daily.   Marland Kitchen omeprazole (PRILOSEC) 20 MG capsule TAKE 1 CAPSULE BY MOUTH EVERY MORNING AS DIRECTED (Patient taking differently: Take 20 mg by mouth every other day. )  . polyethylene glycol (MIRALAX / GLYCOLAX) packet Take 17 g by mouth daily.   . ranolazine (RANEXA) 1000 MG SR tablet TAKE 1 TABLET(1000 MG) BY MOUTH TWICE DAILY  . simvastatin (ZOCOR) 20 MG tablet Take 1 tablet (20 mg total) by mouth at bedtime.  . tamsulosin (FLOMAX) 0.4 MG CAPS capsule Take 1 capsule (0.4 mg total) by mouth daily.  Marland Kitchen zolpidem (AMBIEN) 10 MG tablet TAKE 1 TABLET BY MOUTH EVERY NIGHT AT BEDTIME AS NEEDED FOR SLEEP  . [DISCONTINUED] clonazePAM (KLONOPIN) 0.5 MG tablet Take 0.5 tablets (0.25 mg total) by mouth at bedtime.  . nitroGLYCERIN (NITROSTAT) 0.4 MG SL tablet Place 1 tablet (0.4 mg total) under the tongue every 5 (five) minutes as needed for chest pain. (Patient not taking: Reported on 08/09/2018)   No facility-administered encounter medications on file as of 08/09/2018.     PAST MEDICAL HISTORY:   Past Medical History:    Diagnosis Date  . 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 Diabetes mellitus without complication ([place])   . History of cardiac catheterization 2011   [facility]  . Hyperlipidemia   . Hypertension   . Hypertriglyceridemia   . Parkinson's disease ([place])   . 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 Vertigo     PAST SURGICAL HISTORY:   Past Surgical History:  Procedure Laterality Date  . CARDIAC CATHETERIZATION  05-19-2010   [facility]: Patent grafts. LIMA to LAD, SVG to D1, OM1 and RPDA  . CORONARY ARTERY BYPASS GRAFT  03/1998   5 vessel, [facility]  . RIGHT/LEFT HEART CATH AND CORONARY ANGIOGRAPHY N/A 02/01/2018   Procedure: RIGHT/LEFT HEART CATH AND CORONARY ANGIOGRAPHY;  Surgeon: Wellington Hampshire, MD;  Location: [facility];  Service: Cardiovascular;  Laterality: N/A;    SOCIAL HISTORY:   Social History   Socioeconomic History  . Marital status: Married    Spouse name: Not on file  . Number of children: Not on file  . Years of education: Not on file  . Highest education level: Not on file  Occupational History  . Occupation: retired    Comment: IT work  Scientific laboratory technician  . Financial resource strain: Not hard at all  . Food insecurity:    Worry: Never true    Inability: Never true  . Transportation needs:    Medical: No    Non-medical: No  Tobacco Use  . Smoking status: Never Smoker  . Smokeless tobacco: Never Used  Substance and Sexual Activity  . Alcohol use: Yes    Comment: occasional beer  . Drug use: No  . Sexual activity: Not Currently  Lifestyle  . Physical activity:    Days per week: Not on file  Minutes per session: Not on file  . Stress: Not on file  Relationships  . Social connections:    Talks on phone: Not on file    Gets together: Not on file    Attends religious service: Not on file    Active member of club or organization: Not on file    Attends meetings of clubs or organizations: Not on file    Relationship status: Not on file  . Intimate  partner violence:    Fear of current or ex partner: No    Emotionally abused: No    Physically abused: No    Forced sexual activity: No  Other Topics Concern  . Not on file  Social History Narrative  . Not on file    FAMILY HISTORY:   Family Status  Relation Name Status  . Mother  Deceased  . Father  Deceased  . Brother  Alive  . Son x2 Alive    ROS: Review of Systems  Constitutional: Negative.   HENT: Negative.   Eyes: Negative.   Cardiovascular: Negative.   Gastrointestinal: Negative.   Genitourinary: Negative.   Musculoskeletal: Negative.   Skin: Negative.   Endo/Heme/Allergies: Negative.     PHYSICAL EXAMINATION:    VITALS:   Vitals:   08/09/18 0843  BP: 102/60  Pulse: (!) 52  SpO2: 97%  Weight: 192 lb (87.1 kg)  Height: 5\' 6"  (1.676 m)     No data found.   GEN:  The patient appears stated age and is in NAD. HEENT:  Normocephalic, atraumatic.  The mucous membranes are moist. The superficial temporal arteries are without ropiness or tenderness. CV:  Bradycardic.  regular Lungs:  CTAB Neck/HEME:  There are no carotid bruits bilaterally.  Neurological examination:  Orientation: The patient is alert and oriented x3. Cranial nerves: There is good facial symmetry. The speech is fluent and clear.  EOMI.  Soft palate rises symmetrically and there is no tongue deviation. Hearing is intact to conversational tone. Sensation: Sensation is intact to light touch throughout Motor: Strength is 5/5 in the bilateral upper and lower extremities.   Shoulder shrug is equal and symmetric.  There is no pronator drift.   Movement examination: Tone: There is normal tone in the bilateral upper extremities.  The tone in the lower extremities is normal.  Abnormal movements: There is no resting tremor Coordination:  There is no decremation, with any form of RAMS, including alternating supination and pronation of the forearm, hand opening and closing, finger taps, heel taps and  toe taps. Gait and Station: The patient has no difficulty arising out of a deep-seated chair without the use of the hands. The patient's stride length is normal with just slight decreased arm swing on the left  Lab Results  Component Value Date   WBC 3.8 01/14/2018   HGB 11.8 (L) 01/14/2018   HCT 33.8 (L) 01/14/2018   MCV 96 01/14/2018   PLT 162 01/14/2018   Lab Results  Component Value Date   VITAMINB12 575 08/06/2015   Lab Results  Component Value Date   HGBA1C 5.6 05/17/2018     ASSESSMENT/PLAN:  1.  Idiopathic Parkinson's disease, tremor predominant.  Dx: 11/2015  -We discussed that it used to be thought that levodopa would increase risk of melanoma but now it is believed that Parkinsons itself likely increases risk of melanoma. he is to get regular skin checks.  -He had been off of medication for 24 hours when I saw him previously.  While he certainly has a parkinsonian tremor, he still does not meet formal criteria for Parkinson's disease.  He and I discussed this again today.  However, he feels that he physically does better on levodopa.  We will continue carbidopa/levodopa 25/100, 1 tablet 3 times per day.   -He remains in rock study boxing and I encouraged him to continue that.  2.  Hx of HTN  -PD is really one true cure for HTN.  On several BP lowering agents and BP low in office today (as last visit).  If able, may want to consider lowering meds.  3.  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  Pt states known fx years ago.  Will monitor clinically  4.  RLS and RBD  -increase Clonazepam, 0.5 mg, due to RLS in the AM and continuation of RBD sx's.  Risks, benefits, side effects and alternative therapies were discussed.  The opportunity to ask questions was given and they were answered to the best of my ability.  The patient expressed understanding and willingness to follow the outlined treatment protocols.  PMP aware was reviewed  5.   Episode of L sided shaking  -EEG in 02/2017 was normal.  No further episodes.  Doubt seizure.  6.  RBD  -pulm has ordered a PSG and being done on 3/10 in [place].     7.  Follow up is anticipated in the next few months, sooner should new neurologic issues arise.  Much greater than 50% of this visit was spent in counseling and coordinating care.  Total face to face time:  25 min  Cc:  [name], MD

## 2018-08-06 DIAGNOSIS — M9902 Segmental and somatic dysfunction of thoracic region: Secondary | ICD-10-CM | POA: Diagnosis not present

## 2018-08-06 DIAGNOSIS — M9903 Segmental and somatic dysfunction of lumbar region: Secondary | ICD-10-CM | POA: Diagnosis not present

## 2018-08-06 DIAGNOSIS — M5431 Sciatica, right side: Secondary | ICD-10-CM | POA: Diagnosis not present

## 2018-08-06 DIAGNOSIS — M5136 Other intervertebral disc degeneration, lumbar region: Secondary | ICD-10-CM | POA: Diagnosis not present

## 2018-08-09 ENCOUNTER — Ambulatory Visit (INDEPENDENT_AMBULATORY_CARE_PROVIDER_SITE_OTHER): Payer: Medicare Other | Admitting: Neurology

## 2018-08-09 ENCOUNTER — Encounter: Payer: Self-pay | Admitting: Neurology

## 2018-08-09 VITALS — BP 102/60 | HR 52 | Ht 66.0 in | Wt 192.0 lb

## 2018-08-09 DIAGNOSIS — G20 Parkinson's disease: Secondary | ICD-10-CM | POA: Diagnosis not present

## 2018-08-09 MED ORDER — CLONAZEPAM 0.5 MG PO TABS: 0.5000 mg | ORAL_TABLET | Freq: Every day | ORAL | 4 refills | Status: DC

## 2018-08-09 NOTE — Patient Instructions (Signed)
The physicians and staff at [facility] are committed to providing excellent care. You may receive a survey requesting feedback about your experience at our office. We strive to receive "very good" responses to the survey questions. If you feel that your experience would prevent you from giving the office a "very good " response, please contact our office to try to remedy the situation. We may be reached at [phone]. Thank you for taking the time out of your busy day to complete the survey.  

## 2018-08-13 ENCOUNTER — Ambulatory Visit (INDEPENDENT_AMBULATORY_CARE_PROVIDER_SITE_OTHER): Payer: Medicare Other

## 2018-08-13 VITALS — BP 118/70 | HR 54 | Temp 97.9°F | Resp 15 | Ht 65.25 in | Wt 193.1 lb

## 2018-08-13 DIAGNOSIS — Z0000 Encounter for general adult medical examination without abnormal findings: Secondary | ICD-10-CM | POA: Diagnosis not present

## 2018-08-13 NOTE — Patient Instructions (Addendum)
  Johnathan Arnold , Thank you for taking time to come for your Medicare Wellness Visit. I appreciate your ongoing commitment to your health goals. Please review the following plan we discussed and let me know if I can assist you in the future.   These are the goals we discussed: Goals      Patient Stated   . Healthy Lifestyle (pt-stated)     Stay active        This is a list of the screening recommended for you and due dates:  Health Maintenance  Topic Date Due  . Eye exam for diabetics  08/22/2018  . Complete foot exam   09/22/2018  . Hemoglobin A1C  11/16/2018  . Colon Cancer Screening  06/24/2019  . Tetanus Vaccine  12/18/2020  . Flu Shot  Completed  .  Hepatitis C: One time screening is recommended by Center for Disease Control  (CDC) for  adults born from [birth year] through 1966.   Completed  . Pneumonia vaccines  Completed

## 2018-08-13 NOTE — Progress Notes (Addendum)
Subjective:   Johnathan Arnold. is a 73 y.o. male who presents for Medicare Annual/Subsequent preventive examination.  Review of Systems:  No ROS.  Medicare Wellness Visit. Additional risk factors are reflected in the social history. Cardiac Risk Factors include: advanced age (>56men, >48 women);male gender;hypertension;diabetes mellitus     Objective:    Vitals: BP 118/70 (BP Location: Left Arm, Patient Position: Sitting, Cuff Size: Normal)   Pulse (!) 54   Temp 97.9 F (36.6 C) (Oral)   Resp 15   Ht 5' 5.25" (1.657 m)   Wt 193 lb 1.9 oz (87.6 kg)   SpO2 95%   BMI 31.89 kg/m   Body mass index is 31.89 kg/m.  Advanced Directives 08/13/2018 02/01/2018 08/11/2017 01/19/2017 08/05/2016 12/16/2014 10/11/2014  Does Patient Have a Medical Advance Directive? Yes No Yes Yes Yes Yes Yes  Type of Paramedic of [place];Living will - [place];Living will [place];Living will [place];Living will [place] will  Does patient want to make changes to medical advance directive? No - Patient declined - No - Patient declined - No - Patient declined No - Patient declined No - Patient declined  Copy of [place] in Chart? No - copy requested - No - copy requested - No - copy requested No - copy requested No - copy requested  Would patient like information on creating a medical advance directive? - No - Patient declined - - - - -    Tobacco Social History   Tobacco Use  Smoking Status Never Smoker  Smokeless Tobacco Never Used     Counseling given: Not Answered   Clinical Intake:  Pre-visit preparation completed: Yes        Diabetes: Yes  How often do you need to have someone help you when you read instructions, pamphlets, or other written materials from your doctor or pharmacy?: 1 - Never  Interpreter Needed?: No     Past Medical History:  Diagnosis Date    . 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 Diabetes mellitus without complication ([place])   . History of cardiac catheterization 2011   [facility]  . Hyperlipidemia   . Hypertension   . Hypertriglyceridemia   . Parkinson's disease ([place])   . 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 Vertigo    Past Surgical History:  Procedure Laterality Date  . CARDIAC CATHETERIZATION  05-19-2010   [facility]: Patent grafts. LIMA to LAD, SVG to D1, OM1 and RPDA  . CORONARY ARTERY BYPASS GRAFT  03/1998   5 vessel, [facility]  . RIGHT/LEFT HEART CATH AND CORONARY ANGIOGRAPHY N/A 02/01/2018   Procedure: RIGHT/LEFT HEART CATH AND CORONARY ANGIOGRAPHY;  Surgeon: Wellington Hampshire, MD;  Location: [place] [facility];  Service: Cardiovascular;  Laterality: N/A;   Family History  Problem Relation Age of Onset  . Heart attack Mother 28  . Hypertension Mother   . Heart attack Father 56  . Heart disease Father   . Heart disease Brother    Social History   Socioeconomic History  . Marital status: Married    Spouse name: Not on file  . Number of children: Not on file  . Years of education: Not on file  . Highest education level: Not on file  Occupational History  . Occupation: retired    Comment: IT work  Scientific laboratory technician  . Financial resource strain:  Not hard at all  . Food insecurity:    Worry: Never true    Inability: Never true  . Transportation needs:    Medical: No    Non-medical: No  Tobacco Use  . Smoking status: Never Smoker  . Smokeless tobacco: Never Used  Substance and Sexual Activity  . Alcohol use: Yes    Comment: occasional beer  . Drug use: No  . Sexual activity: Not Currently  Lifestyle  . Physical activity:    Days per week: Not on file    Minutes per session: Not on file  . Stress: Not on file  Relationships  . Social connections:    Talks on phone: Not on file    Gets together: Not on file    Attends religious service: Not on file    Active member of club or organization: Not  on file    Attends meetings of clubs or organizations: Not on file    Relationship status: Not on file  Other Topics Concern  . Not on file  Social History Narrative  . Not on file    Outpatient Encounter Medications as of 08/13/2018  Medication Sig  . aspirin 81 MG tablet Take 162 mg by mouth daily. Takes 2 tablets daily.  . carbidopa-levodopa (SINEMET IR) 25-100 MG tablet Take 1 tablet by mouth 3 (three) times daily.  . carvedilol (COREG) 6.25 MG tablet Take 1 tablet (6.25 mg total) by mouth 2 (two) times daily.  . clonazePAM (KLONOPIN) 0.5 MG tablet Take 1 tablet (0.5 mg total) by mouth at bedtime.  . isosorbide mononitrate (IMDUR) 60 MG 24 hr tablet Take 1 tablet (60 mg total) by mouth daily.  Marland Kitchen losartan (COZAAR) 25 MG tablet Take 1 tablet (25 mg total) by mouth daily.  . Multiple Vitamin (MULTIVITAMIN) tablet Take 1 tablet by mouth daily.    . nitroGLYCERIN (NITROSTAT) 0.4 MG SL tablet Place 1 tablet (0.4 mg total) under the tongue every 5 (five) minutes as needed for chest pain.  . Omega-3 Fatty Acids (FISH OIL) 1200 MG CAPS Take 1 capsule by mouth daily.   Marland Kitchen omeprazole (PRILOSEC) 20 MG capsule TAKE 1 CAPSULE BY MOUTH EVERY MORNING AS DIRECTED (Patient taking differently: Take 20 mg by mouth every other day. )  . polyethylene glycol (MIRALAX / GLYCOLAX) packet Take 17 g by mouth daily.   . ranolazine (RANEXA) 1000 MG SR tablet TAKE 1 TABLET(1000 MG) BY MOUTH TWICE DAILY  . simvastatin (ZOCOR) 20 MG tablet Take 1 tablet (20 mg total) by mouth at bedtime.  . tamsulosin (FLOMAX) 0.4 MG CAPS capsule Take 1 capsule (0.4 mg total) by mouth daily.  . [DISCONTINUED] zolpidem (AMBIEN) 10 MG tablet TAKE 1 TABLET BY MOUTH EVERY NIGHT AT BEDTIME AS NEEDED FOR SLEEP   No facility-administered encounter medications on file as of 08/13/2018.     Activities of Daily Living In your present state of health, do you have any difficulty performing the following activities: 08/13/2018  Hearing? Y  Comment  Hearing aids  Vision? N  Difficulty concentrating or making decisions? N  Walking or climbing stairs? Y  Dressing or bathing? N  Doing errands, shopping? N  Preparing Food and eating ? N  Using the Toilet? N  In the past six months, have you accidently leaked urine? N  Do you have problems with loss of bowel control? N  Managing your Medications? N  Managing your Finances? N  Housekeeping or managing your Housekeeping? N  Some recent  data might be hidden    Patient Care Team: [name], MD as PCP - General (Internal Medicine) Isaias Cowman, MD (Internal Medicine) Tat, Eustace Quail, DO as Consulting Physician (Neurology)   Assessment:   This is a routine wellness examination for [place].  Health Screenings  Colonoscopy -06/23/09 PSA -04/15/18 Glaucoma -none Hearing -hearing aid  Hemoglobin A1C -05/17/18 (5.6) Cholesterol -05/17/18 (116)  Social  Alcohol intake -no Smoking history- -no Smokers in home? none Illicit drug use? none Exercise -rock steady boxing, treadmill, elliptical 3 days weekly, 60 min Diet -regular Sexually Active -not currently  Safety  Patient feels safe at home.  Patient does have smoke detectors at home  Patient does wear sunscreen or protective clothing when in direct sunlight  Patient does wear seat belt when driving or riding with others.   Activities of Daily Living Patient can do their own household chores. Denies needing assistance with: driving, feeding themselves, getting from bed to chair, getting to the toilet, bathing/showering, dressing, managing money, climbing flight of stairs, or preparing meals.   Depression Screen Patient denies losing interest in daily life, feeling hopeless, or crying easily over simple problems.   Fall Screen Patient denies being afraid of falling.  Last fall he missed a step when walking. No injury.   Memory Screen Patient denies problems with memory, misplacing items, and is able to balance  checkbook/bank accounts.  Patient is alert, normal appearance, oriented to person/place/and time. Correctly identified the president of the [place], recall of 3/3 objects, and performing simple calculations.  Patient displays appropriate judgement and can read correct time from watch face.   Immunizations The following Immunizations are up to date: Influenza, shingles, pneumonia, and tetanus.   Other Providers Patient Care Team: [name], MD as PCP - General (Internal Medicine) Isaias Cowman, MD (Internal Medicine) Tat, Eustace Quail, DO as Consulting Physician (Neurology)  Exercise Activities and Dietary recommendations Current Exercise Habits: Home exercise routine, Type of exercise: walking;treadmill;calisthenics(Rock steady boxing), Time (Minutes): 60, Frequency (Times/Week): 3, Weekly Exercise (Minutes/Week): 180, Intensity: Mild  Goals      Patient Stated   . Healthy Lifestyle (pt-stated)     Stay active        Fall Risk Fall Risk  08/09/2018 03/08/2018 08/20/2017 08/11/2017 02/12/2017  Falls in the past year? 1 No No No No  Number falls in past yr: 0 - - - -  Injury with Fall? 0 - - - -  Risk for fall due to : History of fall(s) - - - -  Follow up Falls evaluation completed - - - -    Depression Screen PHQ 2/9 Scores 08/13/2018 08/11/2017 08/05/2016 03/03/2015  PHQ - 2 Score 0 0 0 0    Cognitive Function MMSE - Mini Mental State Exam 08/05/2016  Orientation to time 5  Orientation to Place 5  Registration 3  Attention/ Calculation 5  Recall 3  Language- name 2 objects 2  Language- repeat 1  Language- follow 3 step command 3  Language- read & follow direction 1  Write a sentence 1  Copy design 1  Total score 30     6CIT Screen 08/13/2018 08/11/2017  What Year? 0 points 0 points  What month? 0 points 0 points  What time? 0 points 0 points  Count back from 20 0 points 0 points  Months in reverse 0 points 0 points  Repeat phrase 0 points -  Total Score 0 -     Immunization History  Administered Date(s) Administered  . Influenza Split 02/19/2011, 04/10/2014  . Influenza, High Dose Seasonal PF 04/01/2013, 02/28/2015, 05/17/2018  . Influenza,inj,Quad PF,6+ Mos 03/10/2016, 03/13/2017  . Influenza-Unspecified 03/09/2012  . Pneumococcal Conjugate-13 06/30/2013  . Pneumococcal Polysaccharide-23 05/21/2011, 08/06/2016  . Tdap 12/19/2010  . Zoster 05/09/2012   Screening Tests Health Maintenance  Topic Date Due  . OPHTHALMOLOGY EXAM  08/22/2018  . FOOT EXAM  09/22/2018  . HEMOGLOBIN A1C  11/16/2018  . COLONOSCOPY  06/24/2019  . TETANUS/TDAP  12/18/2020  . INFLUENZA VACCINE  Completed  . Hepatitis C Screening  Completed  . PNA vac Low Risk Adult  Completed       Plan:    End of life planning; Advance aging; Advanced directives discussed. Copy of current HCPOA/Living Will requested.    I have personally reviewed and noted the following in the patient's chart:   . Medical and social history . Use of alcohol, tobacco or illicit drugs  . Current medications and supplements . Functional ability and status . Nutritional status . Physical activity . Advanced directives . List of other physicians . Hospitalizations, surgeries, and ER visits in previous 12 months . Vitals . Screenings to include cognitive, depression, and falls . Referrals and appointments  In addition, I have reviewed and discussed with patient certain preventive protocols, quality metrics, and best practice recommendations. A written personalized care plan for preventive services as well as general preventive health recommendations were provided to patient.     OBrien-Blaney, Omari Koslosky L, LPN  10/13/4330    I have reviewed the above information and agree with above.   Deborra Medina, MD

## 2018-08-16 MED ORDER — OMEPRAZOLE 20 MG PO CPDR: DELAYED_RELEASE_CAPSULE | ORAL | 2 refills | Status: DC

## 2018-08-17 ENCOUNTER — Ambulatory Visit: Payer: Medicare Other | Attending: Internal Medicine

## 2018-08-17 DIAGNOSIS — G4761 Periodic limb movement disorder: Secondary | ICD-10-CM | POA: Diagnosis not present

## 2018-08-17 DIAGNOSIS — G4733 Obstructive sleep apnea (adult) (pediatric): Secondary | ICD-10-CM | POA: Insufficient documentation

## 2018-08-20 DIAGNOSIS — G4733 Obstructive sleep apnea (adult) (pediatric): Secondary | ICD-10-CM | POA: Diagnosis not present

## 2018-08-31 ENCOUNTER — Telehealth: Payer: Self-pay | Admitting: *Deleted

## 2018-08-31 NOTE — Telephone Encounter (Signed)
   Primary Cardiologist:  [name], MD  Patient contacted.  History reviewed.  No symptoms to suggest any unstable cardiac conditions.  Based on discussion, with current pandemic situation, we will be postponing this appointment for Johnathan Arnold. with a plan for f/u in 12  wks or sooner if feasible/necessary.  If symptoms change, he has been instructed to contact our office.   Routing to C19 CANCEL pool for tracking (P CV DIV CV19 CANCEL - reason for visit "other.") and assigning priority (1 = 4-6 wks, 2 = 6-12 wks, 3 = >12 wks).   Ricci Barker, RN  08/31/2018 1:36 PM         .

## 2018-09-02 ENCOUNTER — Telehealth: Payer: Self-pay | Admitting: Pulmonary Disease

## 2018-09-02 NOTE — Telephone Encounter (Signed)
Called and spoke to pt regarding upcoming [place] for 09/09/18. Pt wishes to proceed with telephone visit.  Nothing further is needed at this time.

## 2018-09-03 ENCOUNTER — Ambulatory Visit: Payer: Medicare Other | Admitting: Cardiovascular Disease

## 2018-09-03 DIAGNOSIS — M9901 Segmental and somatic dysfunction of cervical region: Secondary | ICD-10-CM | POA: Diagnosis not present

## 2018-09-03 DIAGNOSIS — M62830 Muscle spasm of back: Secondary | ICD-10-CM | POA: Diagnosis not present

## 2018-09-03 DIAGNOSIS — M9902 Segmental and somatic dysfunction of thoracic region: Secondary | ICD-10-CM | POA: Diagnosis not present

## 2018-09-03 DIAGNOSIS — M5134 Other intervertebral disc degeneration, thoracic region: Secondary | ICD-10-CM | POA: Diagnosis not present

## 2018-09-08 ENCOUNTER — Ambulatory Visit: Payer: Medicare Other | Admitting: Pulmonary Disease

## 2018-09-09 ENCOUNTER — Encounter: Payer: Self-pay | Admitting: Pulmonary Disease

## 2018-09-09 ENCOUNTER — Ambulatory Visit (INDEPENDENT_AMBULATORY_CARE_PROVIDER_SITE_OTHER): Payer: Medicare Other | Admitting: Pulmonary Disease

## 2018-09-09 DIAGNOSIS — G20 Parkinson's disease: Secondary | ICD-10-CM | POA: Diagnosis not present

## 2018-09-09 DIAGNOSIS — R5383 Other fatigue: Secondary | ICD-10-CM | POA: Diagnosis not present

## 2018-09-09 DIAGNOSIS — R911 Solitary pulmonary nodule: Secondary | ICD-10-CM | POA: Diagnosis not present

## 2018-09-09 DIAGNOSIS — G4730 Sleep apnea, unspecified: Secondary | ICD-10-CM

## 2018-09-09 DIAGNOSIS — G4761 Periodic limb movement disorder: Secondary | ICD-10-CM

## 2018-09-09 NOTE — Progress Notes (Signed)
Subjective:    Patient ID: Johnathan Misner., male    DOB: 1946/01/03, 73 y.o.   MRN: [id number]  HPI Patient is a 73 year old lifelong never smoker whom we contacted today via telephone visit due to the SARS-CoV2 (COVID-19) pandemic and directive for social distancing.  The call was for follow-up on previously requested testing namely sleep study.  Verification of the patient's name and date of birth was done.  The patient provided verbal consent to proceed with follow-up via telephone.  Issues discussed where the sleep study in question and issues with an 11 mm elongated nodule in the left lower lobe previously noted.  Please see prior notes in this regard.  The patient continues to have issues with fatigue and tiredness during the day.  His Epworth scale was 12.  He had sleep study performed on 11 March that was consistent with obstructive sleep apnea and periodic limb movement disorder.  The patient has issues with Parkinson's disease and is already on carbidopa/levodopa.  Patient is also on Klonopin and this was recently increased by his neurologist on 2 March mainly to address the issues of restless legs.  Discussed with the patient that the sleep study recommended treatment with auto CPAP at a pressure of between 5 to 20 cm of pressure.  We discussed the pathophysiology of sleep apnea to clearly in view of his concurrent Parkinson's disease.  All of his questions were answered to his satisfaction.  The patient does not describe any new symptomatology since his last visit to include fever, chills or sweats.  No cough or sputum production.  He has been following social distancing measures.  Regards to his restless legs the patient states that his wife has noticed more recently that he is "all over the bed" during sleep.  The patient continues to be concerned about his 1.1 cm lesion in the left lower lobe.  Recall that this is in the costophrenic recess.  It did not have significant uptake  by PET/CT.  Recommendation was to follow in 6 to 12 months with CT scan of the chest.  I have actually recommended follow-up in 4 months and then reassessing at that time.  This could very well represent scar tissue.  Review of Systems  Constitutional: Positive for fatigue.  HENT: Negative.   Eyes: Negative.   Respiratory: Positive for shortness of breath (Perceived mostly as fatigue).   Cardiovascular: Negative.   Gastrointestinal: Negative.   Neurological: Positive for tremors (Parkinson's).  Psychiatric/Behavioral: Positive for sleep disturbance.  All other systems reviewed and are negative.      Objective:   Physical Exam  A physical exam could not be performed due to the nature of the follow-up which was via telephone.      Assessment & Plan:   1.  Obstructive sleep apnea: Patient had a sleep study performed on 11 March which confirms the diagnosis of sleep apnea.  The recommendation would be for auto CPAP at 5- 20 cm of pressure.  The patient at present declines this he would like to "think it over" stressed that he should let us know as soon as he decides due to avoid having to repeat a sleep study.  Pathophysiology of sleep apnea particularly with regards to concomitant neurologic disease, in his case Parkinson's, was discussed with the patient.  2.  Periodic limb movement disorder: The patient does have significant periodic limb movement disorder, this may be adding to his fatigue during the day.  He is currently  on therapy with carbidopa/levodopa for Parkinson's as well as Klonopin at nighttime.  I will defer the management of his periodic limb movement disorder to his neurologist.  Treating obstructive sleep apnea may also reduce the frequency of periodic limb movements.  This was discussed with the patient.  3.  1.1 cm nodule in the left lower lobe costophrenic recess: Follow-up in June with CT scan of the chest.  We will see the patient in follow-up after that aging study is  done.  4.  Parkinson's disease: This issue adds complexity to his management.  5.  Fatigue: This is likely related to issues #1 and 2 above.  He has had extensive work-up of cardiac and lung function that have not shown evidence of cardiopulmonary compromise.

## 2018-09-09 NOTE — Telephone Encounter (Signed)
Spoke with patient. He stated he spoke with someone already from our office and indicated a preference for an in person visit with [name].   He was informed that this may be several months and to call the office in the meantime if he has any questions or concerns.

## 2018-09-17 ENCOUNTER — Other Ambulatory Visit: Payer: Self-pay | Admitting: *Deleted

## 2018-09-17 MED ORDER — ISOSORBIDE MONONITRATE ER 60 MG PO TB24: 60.0000 mg | ORAL_TABLET | Freq: Every day | ORAL | 0 refills | Status: DC

## 2018-09-18 ENCOUNTER — Other Ambulatory Visit: Payer: Self-pay | Admitting: Cardiovascular Disease

## 2018-09-24 MED ORDER — CARVEDILOL 6.25 MG PO TABS: 6.2500 mg | ORAL_TABLET | Freq: Two times a day (BID) | ORAL | 0 refills | Status: DC

## 2018-10-01 DIAGNOSIS — M5134 Other intervertebral disc degeneration, thoracic region: Secondary | ICD-10-CM | POA: Diagnosis not present

## 2018-10-01 DIAGNOSIS — M62830 Muscle spasm of back: Secondary | ICD-10-CM | POA: Diagnosis not present

## 2018-10-01 DIAGNOSIS — M9901 Segmental and somatic dysfunction of cervical region: Secondary | ICD-10-CM | POA: Diagnosis not present

## 2018-10-01 DIAGNOSIS — M9902 Segmental and somatic dysfunction of thoracic region: Secondary | ICD-10-CM | POA: Diagnosis not present

## 2018-10-03 ENCOUNTER — Other Ambulatory Visit: Payer: Self-pay | Admitting: Neurology

## 2018-10-07 ENCOUNTER — Other Ambulatory Visit: Payer: Self-pay | Admitting: Neurology

## 2018-10-29 DIAGNOSIS — M9901 Segmental and somatic dysfunction of cervical region: Secondary | ICD-10-CM | POA: Diagnosis not present

## 2018-10-29 DIAGNOSIS — M5134 Other intervertebral disc degeneration, thoracic region: Secondary | ICD-10-CM | POA: Diagnosis not present

## 2018-10-29 DIAGNOSIS — M9902 Segmental and somatic dysfunction of thoracic region: Secondary | ICD-10-CM | POA: Diagnosis not present

## 2018-10-29 DIAGNOSIS — M62830 Muscle spasm of back: Secondary | ICD-10-CM | POA: Diagnosis not present

## 2018-11-15 ENCOUNTER — Other Ambulatory Visit: Payer: Self-pay

## 2018-11-15 ENCOUNTER — Ambulatory Visit
Admission: RE | Admit: 2018-11-15 | Discharge: 2018-11-15 | Disposition: A | Discharge status: Home / Self Care | Payer: Medicare Other | Source: Ambulatory Visit | Attending: Pulmonary Disease | Admitting: Pulmonary Disease

## 2018-11-15 DIAGNOSIS — R918 Other nonspecific abnormal finding of lung field: Secondary | ICD-10-CM | POA: Diagnosis not present

## 2018-11-15 DIAGNOSIS — R911 Solitary pulmonary nodule: Secondary | ICD-10-CM | POA: Diagnosis not present

## 2018-11-16 ENCOUNTER — Telehealth: Payer: Self-pay | Admitting: Pulmonary Disease

## 2018-11-16 NOTE — Telephone Encounter (Signed)
Called pt to reschedule 11/24/2018 appt. Dr. Patsey Berthold will not be in office.

## 2018-11-17 ENCOUNTER — Telehealth: Payer: Self-pay

## 2018-11-17 DIAGNOSIS — R918 Other nonspecific abnormal finding of lung field: Secondary | ICD-10-CM

## 2018-11-17 NOTE — Telephone Encounter (Signed)
LM for patient to call for CT results.

## 2018-11-17 NOTE — Telephone Encounter (Signed)
-----   Message from Tyler Pita, MD sent at 11/17/2018  8:44 AM EDT ----- Stable CT chest. Repeat in 6 months. He has gallstones, should discuss with his PCP.

## 2018-11-17 NOTE — Telephone Encounter (Signed)
Pt. Rescheduled. Nothing further needed

## 2018-11-18 ENCOUNTER — Other Ambulatory Visit: Payer: Self-pay

## 2018-11-18 DIAGNOSIS — R600 Localized edema: Secondary | ICD-10-CM | POA: Diagnosis not present

## 2018-11-18 DIAGNOSIS — N390 Urinary tract infection, site not specified: Secondary | ICD-10-CM | POA: Diagnosis not present

## 2018-11-18 DIAGNOSIS — I129 Hypertensive chronic kidney disease with stage 1 through stage 4 chronic kidney disease, or unspecified chronic kidney disease: Secondary | ICD-10-CM | POA: Diagnosis not present

## 2018-11-18 DIAGNOSIS — N183 Chronic kidney disease, stage 3 (moderate): Secondary | ICD-10-CM | POA: Diagnosis not present

## 2018-11-18 NOTE — Telephone Encounter (Signed)
Pt is aware of results and voiced his understanding.  CT has bene ordered for 13mo.  Nothing further is needed.

## 2018-11-24 ENCOUNTER — Ambulatory Visit: Payer: Medicare Other | Admitting: Pulmonary Disease

## 2018-11-26 DIAGNOSIS — M9901 Segmental and somatic dysfunction of cervical region: Secondary | ICD-10-CM | POA: Diagnosis not present

## 2018-11-26 DIAGNOSIS — M5134 Other intervertebral disc degeneration, thoracic region: Secondary | ICD-10-CM | POA: Diagnosis not present

## 2018-11-26 DIAGNOSIS — M9902 Segmental and somatic dysfunction of thoracic region: Secondary | ICD-10-CM | POA: Diagnosis not present

## 2018-11-26 DIAGNOSIS — M62830 Muscle spasm of back: Secondary | ICD-10-CM | POA: Diagnosis not present

## 2018-12-08 ENCOUNTER — Telehealth: Payer: Self-pay | Admitting: Pulmonary Disease

## 2018-12-08 ENCOUNTER — Other Ambulatory Visit: Payer: Self-pay

## 2018-12-08 NOTE — Telephone Encounter (Signed)
Called patient for COVID-19 pre-screening for in office visit. ° °Have you recently traveled any where out of the local area in the last 2 weeks? No ° °Have you been in close contact with a person diagnosed with COVID-19 or someone awaiting results within the last 2 weeks? No ° °Do you currently have any of the following symptoms? If so, when did they start? °Cough     Diarrhea   Joint Pain °Fever      Muscle Pain   Red eyes °Shortness of breath   Abdominal pain  Vomiting °Loss of smell    Rash    Sore Throat °Headache    Weakness   Bruising or bleeding ° ° °Okay to proceed with visit 12/09/2018  ° ° °

## 2018-12-09 ENCOUNTER — Encounter: Payer: Self-pay | Admitting: Pulmonary Disease

## 2018-12-09 ENCOUNTER — Ambulatory Visit (INDEPENDENT_AMBULATORY_CARE_PROVIDER_SITE_OTHER): Payer: Medicare Other | Admitting: Pulmonary Disease

## 2018-12-09 VITALS — BP 104/60 | HR 49 | Temp 98.0°F | Ht 67.0 in | Wt 184.6 lb

## 2018-12-09 DIAGNOSIS — R918 Other nonspecific abnormal finding of lung field: Secondary | ICD-10-CM | POA: Diagnosis not present

## 2018-12-09 DIAGNOSIS — G20 Parkinson's disease: Secondary | ICD-10-CM | POA: Diagnosis not present

## 2018-12-09 DIAGNOSIS — R5383 Other fatigue: Secondary | ICD-10-CM | POA: Diagnosis not present

## 2018-12-09 DIAGNOSIS — G4761 Periodic limb movement disorder: Secondary | ICD-10-CM

## 2018-12-09 DIAGNOSIS — G4730 Sleep apnea, unspecified: Secondary | ICD-10-CM | POA: Diagnosis not present

## 2018-12-09 HISTORY — DX: Periodic limb movement disorder: G47.61

## 2018-12-09 NOTE — Progress Notes (Signed)
Subjective:    Patient ID: Johnathan Arnold., male    DOB: 12-Mar-1946, 73 y.o.   MRN: [id number]  HPI Patient is a 73 year old lifelong never smoker with a history of Parkinson's who we have been following here for the issue of a LEFT lower lobe 11 mm elongated nodule.  Patient also has issues with recently diagnosed sleep apnea and has issues with restless leg syndrome.  We had recently discussed sleep study with him during a virtual visit in April 2020.  This was done due to the COVID-19 pandemic and directive with stay at home orders.  Discussion at that time centered on the sleep study that he had a the latter part of March.  The patient has been reluctant to try CPAP and states "I am more concerned about the findings in my lung".  I did advise him that untreated sleep apnea can continue to worsen his daytime fatigue which he already admits he has as well as cause cardiac issues.  The patient had a CT scan of the chest on 8 June, this shows no change from prior CT scans and the recommendation is to follow-up in 6 to 12 months time.  Recall that the patient had his initial CT scan in January 2020 with a negative PET CT in February 2020.  The patient continues to be fixated on the 11 mm elongated nodule on the left base.  This is actually in the costophrenic recess and not in an area that would be amenable to biopsy either by percutaneous or navigational means.  The patient would actually require wedge resection of this and currently he admits that he is not willing to consider surgery.  I have tried to reassure the patient that this is likely a scar resulting from prior infection and that we should follow it expectantly however he continues to be anxious about this, at the same time he declined the offered to be referred to thoracic surgery.  The patient endorses no new complaint.  Continues to have fatigue and excessive daytime somnolence.  He did have Klonopin increased by his neurologist due to his  issues with restless leg syndrome.   Review of Systems  Constitutional: Positive for fatigue. Negative for appetite change and unexpected weight change.  HENT: Negative.   Eyes: Negative.   Respiratory: Negative.   Cardiovascular: Negative.   Gastrointestinal: Negative.   Endocrine: Negative.   Genitourinary: Negative.   Musculoskeletal: Negative.   Skin: Negative.   Allergic/Immunologic: Negative.   Neurological: Positive for tremors.  Hematological: Negative.   Psychiatric/Behavioral: Positive for sleep disturbance.  All other systems reviewed and are negative.      Objective:   Physical Exam Vitals signs and nursing note reviewed.  Constitutional:      General: He is not in acute distress.    Appearance: He is well-developed.     Comments: Appears fatigued  HENT:     Head: Normocephalic and atraumatic.     Nose:     Comments: Nose/mouth/throat not examined due to masking requirements for COVID-19 Eyes:     General: No scleral icterus.    Conjunctiva/sclera: Conjunctivae normal.     Pupils: Pupils are equal, round, and reactive to light.  Neck:     Musculoskeletal: Neck supple.     Thyroid: No thyromegaly.     Vascular: No JVD.     Trachea: No tracheal deviation.  Cardiovascular:     Rate and Rhythm: Normal rate and regular rhythm.  Pulses: Normal pulses.     Heart sounds: Normal heart sounds.  Pulmonary:     Effort: Pulmonary effort is normal.     Breath sounds: Normal breath sounds.  Abdominal:     General: There is no distension.  Musculoskeletal: Normal range of motion.     Right lower leg: No edema.     Left lower leg: No edema.  Lymphadenopathy:     Cervical: No cervical adenopathy.  Skin:    General: Skin is warm and dry.  Neurological:     General: No focal deficit present.     Mental Status: He is alert and oriented to person, place, and time.     Motor: Tremor (Pill-rolling) present.  Psychiatric:        Mood and Affect: Mood normal.  Affect is flat.        Behavior: Behavior normal.   CT scan of the chest performed on 13 November 2018 was serially reviewed independently.  The scan was also reviewed with the patient as well as comparisons to prior scans.    CT image shows 1.1 cm nodule at the LEFT costophrenic recess.  This is an area not amenable for biopsy via percutaneous means or by navigational bronchoscopy.  This would be amenable for robotic bronchoscopy however we do not have this equipment.  Then the next option would be resection.  Assessment & Plan:   1.  Obstructive sleep apnea: Patient is now willing to undergo treatment with CPAP.  Will recommend AutoPap at 5 to 20 cm of pressure with heated humidity.  Download of compliance data in 2 weeks.  If the patient continues using CPAP will refer him to 1 of my colleagues who treats sleep disorders.  2.  Periodic limb movement disorder: The patient has had a diagnosis of "restless legs syndrome".  He is currently on Klonopin.  Part of this issue is related to his Parkinson's.  Patient is already on carbidopa levodopa.  Klonopin was recently increased by his neurologist.  Defer management of this issue to neurology.  3.  1.1 cm nodule of the left lower lobe costophrenic recess: He will have a repeat CT scan of the chest in 6 months time.  4.  Parkinson's disease: This issue adds complexity to his management.  5.  Fatigue: Suspect that this is due to the issues noted above particularly #1 and #2.  We will reassess the symptom after the patient has been on adequate CPAP therapy.    This chart was dictated using voice recognition software/Dragon.  Despite best efforts to proofread, errors can occur which can change the meaning.  Any change was purely unintentional.

## 2018-12-09 NOTE — Patient Instructions (Addendum)
1.  We will set you with an AutoPap device which is CPAP at that adjusts itself to your sleeping pattern.  We will check a compliance download 2 weeks after this is started.  2.  You will be scheduled for a CT scan of the chest in 6 months time.  3.  We will see you in follow-up in 3 months time.  Call sooner should any new difficulties arise.

## 2018-12-13 DIAGNOSIS — E119 Type 2 diabetes mellitus without complications: Secondary | ICD-10-CM | POA: Diagnosis not present

## 2018-12-13 LAB — HM DIABETES EYE EXAM

## 2018-12-20 ENCOUNTER — Encounter: Payer: Self-pay | Admitting: Internal Medicine

## 2018-12-20 ENCOUNTER — Ambulatory Visit (INDEPENDENT_AMBULATORY_CARE_PROVIDER_SITE_OTHER): Payer: Medicare Other | Admitting: Internal Medicine

## 2018-12-20 ENCOUNTER — Other Ambulatory Visit: Payer: Self-pay

## 2018-12-20 VITALS — BP 130/60 | HR 51 | Temp 98.1°F | Resp 15 | Ht 67.0 in | Wt 185.8 lb

## 2018-12-20 DIAGNOSIS — I10 Essential (primary) hypertension: Secondary | ICD-10-CM | POA: Diagnosis not present

## 2018-12-20 DIAGNOSIS — R918 Other nonspecific abnormal finding of lung field: Secondary | ICD-10-CM

## 2018-12-20 DIAGNOSIS — E7800 Pure hypercholesterolemia, unspecified: Secondary | ICD-10-CM | POA: Diagnosis not present

## 2018-12-20 DIAGNOSIS — F5101 Primary insomnia: Secondary | ICD-10-CM | POA: Diagnosis not present

## 2018-12-20 DIAGNOSIS — E1121 Type 2 diabetes mellitus with diabetic nephropathy: Secondary | ICD-10-CM | POA: Diagnosis not present

## 2018-12-20 DIAGNOSIS — H81393 Other peripheral vertigo, bilateral: Secondary | ICD-10-CM

## 2018-12-20 DIAGNOSIS — G4730 Sleep apnea, unspecified: Secondary | ICD-10-CM

## 2018-12-20 MED ORDER — DIAZEPAM 5 MG PO TABS: 5.0000 mg | ORAL_TABLET | Freq: Three times a day (TID) | ORAL | 0 refills | Status: DC | PRN

## 2018-12-20 MED ORDER — DIAZEPAM 5 MG PO TABS: 5.0000 mg | ORAL_TABLET | Freq: Three times a day (TID) | ORAL | 1 refills | Status: DC | PRN

## 2018-12-20 NOTE — Patient Instructions (Signed)
Good to see you!  I have refilled your diazepam to use if you have an attack of vertigo. DO NOT TAKE ON THE SAME DAY YOU TAKE CLONAZEPAM  DO NOT COMBINE  CLONAZEPAM WITH AMBIEN EITHER (I did not refill the [place])

## 2018-12-20 NOTE — Progress Notes (Signed)
Subjective:  Patient ID: Johnathan Nadal., male    DOB: [birth year]  Age: 73 y.o. 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 Diagnoses of Hypertension, unspecified type, Sleep apnea in adult, Pure hypercholesterolemia, Pulmonary nodules, Peripheral vertigo of both ears, and Primary insomnia were also pertinent to this visit.  HPI Johnathan Arnold. presents for follow up on type 2 DM,  CAD and CKD  And Parkinson's Disease . Diagnosed with OSA by carmen gonzalez found during workup for pulmonary nodule .  Has only had the cpap apparatus  for one week  Has not adjusted yet. Last night was better with use of melatonin 3 mg  and clonazepam   Left sided sciatic notch/SI pain  Was severe In September ,  Better now ,  using only tylenol prn  He feels generally well, is exercising several times per week and checking blood sugars once daily at variable times.  BS have been under 130 fasting and < 150 post prandially.  Denies any recent hypoglyemic events.  Taking his medications as directed. Following a carbohydrate modified diet 6 days per week. Denies numbness, burning and tingling of extremities. Appetite is good.    Does not check bp often systolic 242 to 683 .     PD:  Per Tat,  He does  not meet criteria  but tremor is better on Sinemet per last OV with Tat    last app march 2020  Outpatient Medications Prior to Visit  Medication Sig Dispense Refill  . aspirin 81 MG tablet Take 162 mg by mouth daily. Takes 2 tablets daily.    . carbidopa-levodopa (SINEMET IR) 25-100 MG tablet TAKE ONE TABLET BY MOUTH THREE TIMES A DAY 270 tablet 0  . carvedilol (COREG) 6.25 MG tablet Take 1 tablet (6.25 mg total) by mouth 2 (two) times daily. 180 tablet 0  . clonazePAM (KLONOPIN) 0.5 MG tablet Take 1 tablet (0.5 mg total) by mouth at bedtime. 30 tablet 4  . losartan (COZAAR) 25 MG tablet Take 1 tablet (25 mg total) by mouth daily.  90 tablet 1  . Multiple Vitamin (MULTIVITAMIN) tablet Take 1 tablet by mouth daily.      . nitroGLYCERIN (NITROSTAT) 0.4 MG SL tablet Place 1 tablet (0.4 mg total) under the tongue every 5 (five) minutes as needed for chest pain. 25 tablet 1  . Omega-3 Fatty Acids (FISH OIL) 1200 MG CAPS Take 1 capsule by mouth daily.     Marland Kitchen omeprazole (PRILOSEC) 20 MG capsule TAKE 1 CAPSULE BY MOUTH EVERY MORNING AS DIRECTED 30 capsule 2  . polyethylene glycol (MIRALAX / GLYCOLAX) packet Take 17 g by mouth daily.     . ranolazine (RANEXA) 1000 MG SR tablet TAKE 1 TABLET(1000 MG) BY MOUTH TWICE DAILY 180 tablet 3  . simvastatin (ZOCOR) 20 MG tablet Take 1 tablet (20 mg total) by mouth at bedtime. 90 tablet 3  . tamsulosin (FLOMAX) 0.4 MG CAPS capsule Take 1 capsule (0.4 mg total) by mouth daily. 30 capsule 6  . isosorbide mononitrate (IMDUR) 60 MG 24 hr tablet TAKE 1 TABLET BY MOUTH EVERY DAY 90 tablet 0   No facility-administered medications prior to visit.     Review of Systems;  Patient denies headache, fevers, malaise, unintentional weight loss, skin rash, eye pain, sinus congestion and sinus pain, sore throat, dysphagia,  hemoptysis , cough, dyspnea, wheezing, chest pain, palpitations, orthopnea, edema, abdominal pain,  nausea, melena, diarrhea, constipation, flank pain, dysuria, hematuria, urinary  Frequency, nocturia, numbness, tingling, seizures,  Focal weakness, Loss of consciousness,  Tremor, insomnia, depression, anxiety, and suicidal ideation.      Objective:  BP 130/60 (BP Location: Left Arm, Patient Position: Sitting, Cuff Size: Normal)   Pulse (!) 51   Temp 98.1 F (36.7 C) (Oral)   Resp 15   Ht 5\' 7"  (1.702 m)   Wt 185 lb 12.8 oz (84.3 kg)   SpO2 97%   BMI 29.10 kg/m   BP Readings from Last 3 Encounters:  12/20/18 130/60  12/09/18 104/60  08/13/18 118/70    Wt Readings from Last 3 Encounters:  12/20/18 185 lb 12.8 oz (84.3 kg)  12/09/18 184 lb 9.6 oz (83.7 kg)  08/13/18 193 lb  1.9 oz (87.6 kg)    General appearance: alert, cooperative and appears stated age Ears: normal TM's and external ear canals both ears Throat: lips, mucosa, and tongue normal; teeth and gums normal Neck: no adenopathy, no carotid bruit, supple, symmetrical, trachea midline and thyroid not enlarged, symmetric, no tenderness/mass/nodules Back: symmetric, no curvature. ROM normal. No CVA tenderness. Lungs: clear to auscultation bilaterally Heart: regular rate and rhythm, S1, S2 normal, no murmur, click, rub or gallop Abdomen: soft, non-tender; bowel sounds normal; no masses,  no organomegaly Pulses: 2+ and symmetric Skin: Skin color, texture, turgor normal. No rashes or lesions Lymph nodes: Cervical, supraclavicular, and axillary nodes normal.  Lab Results  Component Value Date   HGBA1C 5.4 12/20/2018   HGBA1C 5.6 05/17/2018   HGBA1C 5.8 09/21/2017    Lab Results  Component Value Date   CREATININE 1.74 (H) 12/20/2018   CREATININE 1.49 05/17/2018   CREATININE 2.11 (H) 03/02/2018    Lab Results  Component Value Date   WBC 3.8 01/14/2018   HGB 11.8 (L) 01/14/2018   HCT 33.8 (L) 01/14/2018   PLT 162 01/14/2018   GLUCOSE 113 (H) 12/20/2018   CHOL 116 05/17/2018   TRIG 102.0 05/17/2018   HDL 53.20 05/17/2018   LDLDIRECT 38.0 08/06/2015   LDLCALC 42 05/17/2018   ALT 11 12/20/2018   AST 18 12/20/2018   NA 138 12/20/2018   K 5.1 12/20/2018   CL 104 12/20/2018   CREATININE 1.74 (H) 12/20/2018   BUN 21 12/20/2018   CO2 29 12/20/2018   TSH 2.61 08/06/2016   PSA 1.08 08/01/2014   HGBA1C 5.4 12/20/2018   MICROALBUR 3.9 (H) 05/17/2018    Ct Chest Wo Contrast  Result Date: 11/15/2018 CLINICAL DATA:  Pulmonary nodule follow-up. EXAM: CT CHEST WITHOUT CONTRAST TECHNIQUE: Multidetector CT imaging of the chest was performed following the standard protocol without IV contrast. COMPARISON:  PET-CT dated 07/16/2018.  CT chest dated 07/01/2018. FINDINGS: Cardiovascular: The patient is  status post prior CABG. Advanced coronary artery calcifications are again noted. The heart size is stable from prior study. There is no significant pericardial effusion. The heart size is stable enlarged. Mediastinum/Nodes: No enlarged mediastinal or axillary lymph nodes. Thyroid gland, trachea, and esophagus demonstrate no significant findings. Lungs/Pleura: There is a stable 6 mm pulmonary nodule at the right lung apex (remeasured) located at axial series 3, image 19. There is a stable 3 mm nodule in the right upper lobe (axial series 3, image 76). There is a stable 1.1 cm nodule at the left lung base (axial series 3, image 114). The trachea is unremarkable. No pneumothorax or pleural effusion. No significant area of consolidation. Upper Abdomen: There is a stable fat  containing nodule towards the tail of the pancreas. There are likely stones within the gallbladder lumen. There is no CT evidence of acute cholecystitis. There is a stable calcification near the distal CBD. Musculoskeletal: No chest wall mass or suspicious bone lesions identified. IMPRESSION: 1. Stable bilateral pulmonary nodules measuring up to approximately 1.1 cm in the left lower lobe. Consider a 6-12 month follow-up CT chest for further evaluation. Status post CABG. 2. Probable cholelithiasis. Aortic Atherosclerosis (ICD10-I70.0). Electronically Signed   By: Constance Holster M.D.   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  Continue ASA, ARB, and statin   Lab Results  Component Value Date   HGBA1C 5.4 12/20/2018   Lab Results  Component Value Date   MICROALBUR 3.9 (H) 05/17/2018  '       Relevant Orders   Hemoglobin A1c (Completed)   Comprehensive metabolic panel (Completed)   Vertigo, peripheral    Recurrent. Refilled diazepam for prn  use Brain imaging done to rule out mass.       Sleep apnea in adult    Recently diagnosed and treated with CPAP.  Still adujsting to facemask       Pulmonary nodules    Stable nodules in the  right lung apex,  Right upper lobe, and left lower lobe .  . CT chest repeated June 2020. He has a 1.1 cm nodule in the LLL and multiple sub centimeter nodes on the right.        Insomnia    Managed with Lorrin Mais.  Chronic, with no improvement using over-the-counter first generation antihistamines. Reviewed principles of good sleep hygiene. Although she is a snorer, there is no report of apneic spells by husband. Previous use of Ambien tolerated well.  The risks and benefits of benzodiazepine use were discussed with patient today including excessive sedation leading to respiratory depression,  impaired thinking/driving, and addiction.  Patient was advised to avoid concurrent use with alcohol, to use medication only as needed and not to share with others  .       Hypertension    Well controlled on current regimen. Renal function stable, no changes today.  Lab Results  Component Value Date   NA 138 12/20/2018   K 5.1 12/20/2018   CL 104 12/20/2018   CO2 29 12/20/2018   Lab Results  Component Value Date   CREATININE 1.74 (H) 12/20/2018         Hyperlipidemia    Managed with simvastatin with goal LDL < 70. No changes today   Lab Results  Component Value Date   CHOL 116 05/17/2018   HDL 53.20 05/17/2018   LDLCALC 42 05/17/2018   LDLDIRECT 38.0 08/06/2015   TRIG 102.0 05/17/2018   CHOLHDL 2 05/17/2018            I have changed Johnathan Latino Jr.'s diazepam. I am also having him maintain his multivitamin, aspirin, Fish Oil, polyethylene glycol, nitroGLYCERIN, ranolazine, tamsulosin, losartan, simvastatin, clonazePAM, omeprazole, carvedilol, carbidopa-levodopa, and diazepam.  Meds ordered this encounter  Medications  . diazepam (VALIUM) 5 MG tablet    Sig: Take 1 tablet (5 mg  total) by mouth every 8 (eight) hours as needed (vertigo).    Dispense:  30 tablet    Refill:  0  .  diazepam (VALIUM) 5 MG tablet    Sig: Take 1 tablet (5 mg total) by mouth every 8 (eight) hours as needed for anxiety (or vertigo).    Dispense:  60 tablet    Refill:  1    There are no discontinued medications.  Follow-up: No follow-ups on file.   [name], MD

## 2018-12-21 ENCOUNTER — Other Ambulatory Visit: Payer: Self-pay | Admitting: Cardiovascular Disease

## 2018-12-21 DIAGNOSIS — G4700 Insomnia, unspecified: Secondary | ICD-10-CM | POA: Insufficient documentation

## 2018-12-21 LAB — COMPREHENSIVE METABOLIC PANEL
ALT: 11 U/L (ref 0–53)
AST: 18 U/L (ref 0–37)
Albumin: 4.5 g/dL (ref 3.5–5.2)
Alkaline Phosphatase: 42 U/L (ref 39–117)
BUN: 21 mg/dL (ref 6–23)
CO2: 29 mEq/L (ref 19–32)
Calcium: 9.2 mg/dL (ref 8.4–10.5)
Chloride: 104 mEq/L (ref 96–112)
Creatinine, Ser: 1.74 mg/dL — ABNORMAL HIGH (ref 0.40–1.50)
GFR: 38.55 mL/min — ABNORMAL LOW (ref >60.00)
Glucose, Bld: 113 mg/dL — ABNORMAL HIGH (ref 70–99)
Potassium: 5.1 mEq/L (ref 3.5–5.1)
Sodium: 138 mEq/L (ref 135–145)
Total Bilirubin: 0.7 mg/dL (ref 0.2–1.2)
Total Protein: 6.2 g/dL (ref 6.0–8.3)

## 2018-12-21 LAB — HEMOGLOBIN A1C: Hgb A1c MFr Bld: 5.4 % (ref 4.6–6.5)

## 2018-12-21 NOTE — Assessment & Plan Note (Signed)
  Remains well controlled  On diet alone.  Continue ASA, ARB, and statin   Lab Results  Component Value Date   HGBA1C 5.4 12/20/2018   Lab Results  Component Value Date   MICROALBUR 3.9 (H) 05/17/2018  '

## 2018-12-21 NOTE — Assessment & Plan Note (Signed)
Managed with simvastatin with goal LDL < 70. No changes today   Lab Results  Component Value Date   CHOL 116 05/17/2018   HDL 53.20 05/17/2018   LDLCALC 42 05/17/2018   LDLDIRECT 38.0 08/06/2015   TRIG 102.0 05/17/2018   CHOLHDL 2 05/17/2018

## 2018-12-21 NOTE — Assessment & Plan Note (Addendum)
Managed with ambien.  Chronic, with no improvement using over-the-counter first generation antihistamines. Reviewed principles of good sleep hygiene. Although she is a snorer, there is no report of apneic spells by husband. Previous use of Ambien tolerated well.  The risks and benefits of benzodiazepine use were discussed with patient today including excessive sedation leading to respiratory depression,  impaired thinking/driving, and addiction.  Patient was advised to avoid concurrent use with alcohol, to use medication only as needed and not to share with others  .

## 2018-12-21 NOTE — Assessment & Plan Note (Signed)
Recurrent. Refilled diazepam for prn use Brain imaging done to rule out mass.

## 2018-12-21 NOTE — Assessment & Plan Note (Signed)
Well controlled on current regimen. Renal function stable, no changes today.  Lab Results  Component Value Date   NA 138 12/20/2018   K 5.1 12/20/2018   CL 104 12/20/2018   CO2 29 12/20/2018   Lab Results  Component Value Date   CREATININE 1.74 (H) 12/20/2018

## 2018-12-21 NOTE — Assessment & Plan Note (Signed)
Stable nodules in the  right lung apex,  Right upper lobe, and left lower lobe .  . CT chest repeated June 2020. He has a 1.1 cm nodule in the LLL and multiple sub centimeter nodes on the right.

## 2018-12-21 NOTE — Assessment & Plan Note (Signed)
Recently diagnosed and treated with CPAP.  Still adujsting to facemask

## 2018-12-26 ENCOUNTER — Other Ambulatory Visit: Payer: Self-pay | Admitting: Cardiovascular Disease

## 2018-12-26 ENCOUNTER — Other Ambulatory Visit: Payer: Self-pay | Admitting: Neurology

## 2018-12-27 NOTE — Telephone Encounter (Signed)
Requested Prescriptions   Pending Prescriptions Disp Refills  . carbidopa-levodopa (SINEMET IR) 25-100 MG tablet [Pharmacy Med Name: CARBIDOPA-LEVODOPA 25-100 TAB] 270 tablet 0    Sig: TAKE ONE TABLET BY MOUTH THREE TIMES A DAY   Rx last filled:10/04/18 #270 0 refills  Pt last seen:08/09/18  Follow up appt scheduled:01/25/19

## 2018-12-29 DIAGNOSIS — M62830 Muscle spasm of back: Secondary | ICD-10-CM | POA: Diagnosis not present

## 2018-12-29 DIAGNOSIS — M9901 Segmental and somatic dysfunction of cervical region: Secondary | ICD-10-CM | POA: Diagnosis not present

## 2018-12-29 DIAGNOSIS — M9902 Segmental and somatic dysfunction of thoracic region: Secondary | ICD-10-CM | POA: Diagnosis not present

## 2018-12-29 DIAGNOSIS — M5134 Other intervertebral disc degeneration, thoracic region: Secondary | ICD-10-CM | POA: Diagnosis not present

## 2019-01-09 ENCOUNTER — Other Ambulatory Visit: Payer: Self-pay | Admitting: Internal Medicine

## 2019-01-14 ENCOUNTER — Ambulatory Visit: Payer: Medicare Other | Admitting: Neurology

## 2019-01-21 ENCOUNTER — Other Ambulatory Visit: Payer: Self-pay | Admitting: Neurology

## 2019-01-21 NOTE — Telephone Encounter (Signed)
Requested Prescriptions   Pending Prescriptions Disp Refills  . clonazePAM (KLONOPIN) 0.5 MG tablet [Pharmacy Med Name: clonazePAM 0.5 MG TABLET] 30 tablet 3    Sig: TAKE ONE TABLET BY MOUTH EVERY NIGHT AT BEDTIME   Rx last filled: 08/09/18 #30 4 refills  Pt last seen:08/09/18   Follow up appt scheduled: 01/25/19

## 2019-01-21 NOTE — Progress Notes (Signed)
Johnathan Arnold. was seen today in the movement disorders clinic for neurologic consultation at the request of [name], MD.  This patient is accompanied in the office by his wife who supplements the history.  The consultation is for the evaluation of PD.  Pt currently under the care of Dr Manuella Ghazi and I have reviewed his records.  Began to see Dr. Manuella Ghazi in June, 2017.  His first symptom was right arm tremor.  By the time he began to see Dr. Manuella Ghazi in June, 2017 he had tremor in both hands.  He was started on carbidopa/levodopa 25/100, one tablet 3 times per day in June, 2017. Pt states that it did help tremor. He last saw Dr. Brigitte Pulse on 09/02/2016.  His levodopa was increased to 1-1/2 tablets 3 times per day (6-8am/1-2pm/6-7pm).  He cannot tell when the med wears off.   Specific Symptoms:  Tremor: Yes.  , mostly R hand at rest.   Family hx of similar:  No. (grandfather with tremor) Voice: maybe slight decrease Sleep: sleeps well  Vivid Dreams:  Yes.    Acting out dreams:  Yes.   (screams, has fallen out of bed) Wet Pillows: just a little Postural symptoms:  No.  Falls?  No. Bradykinesia symptoms: slight drag of the R leg Loss of smell: has never had great sense of smell Loss of taste:  No. Urinary Incontinence:  No. Difficulty Swallowing:  No. Handwriting, micrographia: Yes.   Trouble with ADL's:  No. (does brace self when putting on pants)  Trouble buttoning clothing: No. Depression:  No. Memory changes:  Some naming trouble (people) Hallucinations:  No.  visual distortions: No. N/V:  No. Lightheaded:  No. 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 Diplopia:  No. Dyskinesia:  No.  Patient had an MRI of the brain on 01/15/2015.  I had the opportunity to review this.  There was very mild small vessel disease.  Enhancing 11 x 13 mm cross-section soft tissue abnormality in the  RIGHT lateral mastoid. Cholesteatoma not excluded.   02/12/17 update:  Patient seen  today in follow-up for Parkinson's disease.  This patient is accompanied in the office by his spouse who supplements the history.   I asked him to hold his levodopa today, since I have never seen him off of levodopa.  He has been off of it for 24 hours.  He generally is on carbidopa/levodopa 25/100, 1.5 tablets 3 times per day.  I have reviewed records since our last visit.  He was in the emergency room on 01/19/2017 with complaints of shaking.  He felt that this was worse than his usual shaking and on the opposite side.  Workup was unremarkable in the emergency room.   Pt states that he was up in [place] with friends.  He felt like he was getting vertigo.  He was getting nauseated.  He took a valium for the vertigo.  He drove home.  He went to get out of the car and he was shaking on the L arm and leg.  Friends assisted him into the house.  Both legs felt weak but only the L was shaking and it only shook when he stood.  The shaking lasted minutes and seemed to go away when he laid down on the couch.  He went to sleep.  When he woke up he decided to get checked out.  He went to [facility] few weeks ago and sons asked  him to see another neurologist and they recommended neupro.  Pt didn't start that medication but has some samples.  One episode dizziness getting out of bed because got up too quickly.   He does c/o RLS.  That seems to come and go.  Wife notices him flailing around in sleep and its disturbing her sleep.    08/20/17 update: Patient was seen today as a work in.  He is accompanied by his spouse who supplements the history.  I have not seen him in about 6 months.  He states that he has been "playing with the carbidopa/levodopa 25/100" and was taking it around breakfast/lunch/dinner.  He is starting to take q 6 hours.  Thought that would improve tremor (wondered if overmedicating).  Noting some more tremor on the L side (not just the right).  Was previously on carbidopa/levodopa 25/100, 1-1/2 tablets 3 times  per day.  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  It helped but he d/c it as sx's went away.  He feels good still.  Pt denies falls.  Pt denies lightheadedness, near syncope.  No hallucinations.  Mood has been good.  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  Some word finding and memory trouble. Had nl EEG since last visit.  The records that were made available to me were reviewed.  Had flu in Jan.    03/08/18 update: Patient is seen today in follow-up for parkinsonism.  He is accompanied by his wife who supplements history.  Patient is currently on carbidopa/levodopa 25/100, 1 tablet 3 times per day.  No falls since last visit.  No lightheadedness or near syncope.  He is acting out the dreams.  This morning, he was pulling on his wifes arm.  He is not taking the klonopin any longer.  He is going to [facility] 3 days per week.  Records are reviewed since our last visit.  He was in the hospital at the end of August for heart catheterization.  Following catheterization, continued medical therapy was recommended.  08/09/18 update: Patient is seen today in follow-up for parkinsonism.  He is accompanied by his wife who supplements the history.  Patient is on carbidopa/levodopa 25/100, 1 tablet 3 times per day.  Little more tremor.  Handwriting is a little more small.   He has had no falls.  No lightheadedness or near syncope.  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  Last visit, I started him on clonazepam, 0.25 mg, because of acting out of the dreams.  He is no longer having RLS when he goes to bed but he has it when he wakes up at 5am.  Wife states that he is still hitting her at night with acting out the dreams.  Have reviewed other records since our last visit.  Last saw primary care in May 17, 2018.  No changes were made to his regimen.  He saw pulmonary on July 28, 2018 due to a lung nodule, which is just being followed with serial CT.  A sleep study was ordered.  This is being done on  3/10 in [place].    01/25/19 update: Patient seen today in follow-up for parkinsonism. Wife on the phone and supplements the history.  He is on carbidopa/levodopa 25/100, 1 tablet 3 times per day.  Pt denies falls.  Pt denies lightheadedness, near syncope.  No hallucinations.  Mood has been good.  I did increase the pts klonopin last visit to 0.5 mg nightly.  Wife reports that he  is still thrashing all over. Pt also reports that RLS at night is still bothering him, esp in the AM.  Having some trouble with balance and speech.  The records that were made available to me were reviewed.  Patient apparently had a nocturnal polysomnogram done since our last visit, although I cannot actually find that the formal report.  He apparently was diagnosed with sleep apnea, and pt states that he has been on the cpap for a month.  Patient did see primary care on December 20, 2018.  He was given Valium for the vertigo.  He was told not to take it on the same day as clonazepam.  Pt states that he hasn't needed to take the valium in 3-4 months.  PREVIOUS MEDICATIONS: Sinemet, klonopin (helped but pt d/c as sx's were better)  ALLERGIES:  No Known Allergies  CURRENT MEDICATIONS:  Outpatient Encounter Medications as of 01/25/2019  Medication Sig   aspirin 81 MG tablet Take 162 mg by mouth daily. Takes 2 tablets daily.   carbidopa-levodopa (SINEMET IR) 25-100 MG tablet TAKE ONE TABLET BY MOUTH THREE TIMES A DAY   carvedilol (COREG) 6.25 MG tablet TAKE ONE TABLET BY MOUTH TWICE A DAY   clonazePAM (KLONOPIN) 0.5 MG tablet Take 1 tablet (0.5 mg total) by mouth at bedtime.   isosorbide mononitrate (IMDUR) 60 MG 24 hr tablet TAKE ONE TABLET BY MOUTH DAILY   losartan (COZAAR) 25 MG tablet TAKE ONE TABLET BY MOUTH DAILY   Multiple Vitamin (MULTIVITAMIN) tablet Take 1 tablet by mouth daily.     nitroGLYCERIN (NITROSTAT) 0.4 MG SL tablet Place 1 tablet (0.4 mg total) under the tongue every 5 (five) minutes as needed for  chest pain.   Omega-3 Fatty Acids (FISH OIL) 1200 MG CAPS Take 1 capsule by mouth daily.    omeprazole (PRILOSEC) 20 MG capsule TAKE 1 CAPSULE BY MOUTH EVERY MORNING AS DIRECTED   polyethylene glycol (MIRALAX / GLYCOLAX) packet Take 17 g by mouth daily.    ranolazine (RANEXA) 1000 MG SR tablet TAKE 1 TABLET(1000 MG) BY MOUTH TWICE DAILY   simvastatin (ZOCOR) 20 MG tablet Take 1 tablet (20 mg total) by mouth at bedtime.   tamsulosin (FLOMAX) 0.4 MG CAPS capsule Take 1 capsule (0.4 mg total) by mouth daily.   diazepam (VALIUM) 5 MG tablet Take 1 tablet (5 mg total) by mouth every 8 (eight) hours as needed for anxiety (or vertigo).   [DISCONTINUED] diazepam (VALIUM) 5 MG tablet Take 1 tablet (5 mg total) by mouth every 8 (eight) hours as needed (vertigo). (Patient not taking: Reported on 01/25/2019)   No facility-administered encounter medications on file as of 01/25/2019.     PAST MEDICAL HISTORY:   Past Medical History:  Diagnosis Date   3-vessel coronary artery disease    s/p  5 vessel CABG   Diabetes mellitus without complication ([place])    History of cardiac catheterization 2011   [facility]   Hyperlipidemia    Hypertension    Hypertriglyceridemia    Parkinson's disease ([place])    S/P CABG x 5 11-99   Vertigo     PAST SURGICAL HISTORY:   Past Surgical History:  Procedure Laterality Date   CARDIAC CATHETERIZATION  05-19-2010   [facility]: Patent grafts. LIMA to LAD, SVG to D1, OM1 and RPDA   CORONARY ARTERY BYPASS GRAFT  03/1998   5 vessel, [place]   RIGHT/LEFT HEART CATH AND CORONARY ANGIOGRAPHY N/A 02/01/2018   Procedure: RIGHT/LEFT HEART CATH AND CORONARY  ANGIOGRAPHY;  Surgeon: Wellington Hampshire, MD;  Location: [place] [facility];  Service: Cardiovascular;  Laterality: N/A;    SOCIAL HISTORY:   Social History   Socioeconomic History   Marital status: Married    Spouse name: Not on file   Number of children: 2   Years of education: Not on file   Highest  education level: Master's degree (e.g., MA, MS, MEng, MEd, MSW, MBA)  Occupational History   Occupation: retired    Comment: IT work  Scientist, product/process development strain: Not hard at [employer]    Worry: Never true    Inability: Never true   Transportation needs    Medical: No    Non-medical: No  Tobacco Use   Smoking status: Never Smoker   Smokeless tobacco: Never Used  Substance and Sexual Activity   Alcohol use: Yes    Comment: occasional beer   Drug use: No   Sexual activity: Not Currently  Lifestyle   Physical activity    Days per week: Not on file    Minutes per session: Not on file   Stress: Not on file  Relationships   Social connections    Talks on phone: Not on file    Gets together: Not on file    Attends religious service: Not on file    Active member of club or organization: Not on file    Attends meetings of clubs or organizations: Not on file    Relationship status: Not on file   Intimate partner violence    Fear of current or ex partner: No    Emotionally abused: No    Physically abused: No    Forced sexual activity: No  Other Topics Concern   Not on file  Social History Narrative   Not on file    FAMILY HISTORY:   Family Status  Relation Name Status   Mother  Deceased   Father  Deceased   Brother  Alive   Son x2 Alive    ROS: Review of Systems  Constitutional: Negative.   HENT: Negative.   Eyes: Negative.   Respiratory: Negative.   Cardiovascular: Negative.   Gastrointestinal: Negative.   Genitourinary: Negative.   Musculoskeletal: Negative.   Skin: Negative.     PHYSICAL EXAMINATION:    VITALS:   Vitals:   01/25/19 1111  BP: (!) 172/62  Pulse: (!) 47  SpO2: 98%  Weight: 190 lb 3.2 oz (86.3 kg)  Height: 5\' 6"  (1.676 m)     No data found.   GEN:  The patient appears stated age and is in NAD. HEENT:  Normocephalic, atraumatic.  The mucous membranes are moist. The superficial temporal  arteries are without ropiness or tenderness. CV:  Loletha Grayer.  regular Lungs:  CTAB Neck/HEME:  There are no carotid bruits bilaterally.  Neurological examination:  Orientation: The patient is alert and oriented x3. Cranial nerves: There is good facial symmetry. The speech is fluent and clear.  EOMI.  Soft palate rises symmetrically and there is no tongue deviation. Hearing is intact to conversational tone. Sensation: Sensation is intact to light touch throughout Motor: Strength is 5/5 in the bilateral upper and lower extremities.   Shoulder shrug is equal and symmetric.  There is no pronator drift.   Movement examination: Tone: There is normal tone in the bilateral upper extremities.  The tone in the lower extremities is normal.  Abnormal movements: There is RUE rest tremor Coordination:  There is no decremation, with any form of RAMS, including alternating supination and pronation of the forearm, hand opening and closing, finger taps, heel taps and toe taps. Gait and Station: The patient has no difficulty arising out of a deep-seated chair without the use of the hands. The patient's stride length is normal with just slight decreased arm swing on the left  Lab Results  Component Value Date   WBC 3.8 01/14/2018   HGB 11.8 (L) 01/14/2018   HCT 33.8 (L) 01/14/2018   MCV 96 01/14/2018   PLT 162 01/14/2018   Lab Results  Component Value Date   VITAMINB12 575 08/06/2015   Lab Results  Component Value Date   HGBA1C 5.4 12/20/2018     ASSESSMENT/PLAN:  1.  Idiopathic Parkinson's disease, tremor predominant.  Dx: 11/2015  -We discussed that it used to be thought that levodopa would increase risk of melanoma but now it is believed that Parkinsons itself likely increases risk of melanoma. he is to get regular skin checks.  -He had been off of medication for 24 hours when I saw him previously.  While he certainly has a parkinsonian tremor, he still does not meet formal criteria for  Parkinson's disease.  He and I discussed this again today.  However, he feels that he physically does better on levodopa.  We will continue carbidopa/levodopa 25/100, 1 tablet 3 times per day.   -add carbidopa/levodopa 50/200 CR at bedtime for RLS in the early morning  -considered PT/ST  2.  Hx of HTN  -BP little high today but was low several prior visits.  Will monitor along with PCP  3.  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  Pt states known fx years ago.  Will monitor clinically  4.  RLS and RBD  -Long discussion with the patient regarding safety of medication.  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  He recently got Valium from his primary care physician for vertigo.  Discussed that these are both benzodiazepines.  Discussed that they should not be taken in combination with one another.  Pt expressed understanding  -slightly increase Clonazepam, 0.5 mg, 1. 5 tablets due to RLS in the AM and continuation of RBD sx's.  Risks, benefits, side effects and alternative therapies were discussed.  The opportunity to ask questions was given and they were answered to the best of my ability.  The patient expressed understanding and willingness to follow the outlined treatment protocols.  PMP aware was reviewed  5.  Episode of L sided shaking  -EEG in 02/2017 was normal.  No further episodes.  Doubt seizure.  6.  Obstructive sleep apnea syndrome  -Understands morbidity and mortality associated with untreated sleep apnea.  Just started cpap  7.  Follow up is anticipated in the next 4-6 months, sooner should new neurologic issues arise.  Much greater than 50% of this visit was spent in counseling and coordinating care.  Total face to face time:  25 min  Cc:  [name], MD

## 2019-01-24 NOTE — Telephone Encounter (Signed)
Denied Patient has appt tomorrow will reassess then per provider

## 2019-01-25 ENCOUNTER — Ambulatory Visit (INDEPENDENT_AMBULATORY_CARE_PROVIDER_SITE_OTHER): Payer: Medicare Other | Admitting: Neurology

## 2019-01-25 ENCOUNTER — Encounter: Payer: Self-pay | Admitting: Neurology

## 2019-01-25 ENCOUNTER — Other Ambulatory Visit: Payer: Self-pay

## 2019-01-25 VITALS — BP 172/62 | HR 47 | Ht 66.0 in | Wt 190.2 lb

## 2019-01-25 DIAGNOSIS — G20 Parkinson's disease: Secondary | ICD-10-CM

## 2019-01-25 DIAGNOSIS — G2581 Restless legs syndrome: Secondary | ICD-10-CM | POA: Diagnosis not present

## 2019-01-25 DIAGNOSIS — G4752 REM sleep behavior disorder: Secondary | ICD-10-CM | POA: Diagnosis not present

## 2019-01-25 MED ORDER — CLONAZEPAM 0.5 MG PO TABS: ORAL_TABLET | ORAL | 5 refills | Status: DC

## 2019-01-25 MED ORDER — CARBIDOPA-LEVODOPA ER 50-200 MG PO TBCR: 1.0000 | EXTENDED_RELEASE_TABLET | Freq: Every day | ORAL | 5 refills | Status: DC

## 2019-01-25 NOTE — Patient Instructions (Signed)
1.  Continue carbidopa/levodopa 25/100 at 8am/noon/4pm 2.  Add carbidopa/levodopa 50/200 at bedtime for AM restless leg syndrome 3.  Increase clonazepam, 0.5 mg, 1.5 tablets at bedtime

## 2019-01-27 ENCOUNTER — Ambulatory Visit (INDEPENDENT_AMBULATORY_CARE_PROVIDER_SITE_OTHER): Payer: Medicare Other | Admitting: Cardiovascular Disease

## 2019-01-27 ENCOUNTER — Encounter: Payer: Self-pay | Admitting: Cardiovascular Disease

## 2019-01-27 ENCOUNTER — Ambulatory Visit: Payer: Medicare Other | Admitting: Cardiovascular Disease

## 2019-01-27 ENCOUNTER — Other Ambulatory Visit: Payer: Self-pay

## 2019-01-27 VITALS — BP 146/60 | HR 57 | Ht 66.0 in | Wt 190.4 lb

## 2019-01-27 DIAGNOSIS — I10 Essential (primary) hypertension: Secondary | ICD-10-CM

## 2019-01-27 DIAGNOSIS — I25118 Atherosclerotic heart disease of native coronary artery with other forms of angina pectoris: Secondary | ICD-10-CM | POA: Diagnosis not present

## 2019-01-27 DIAGNOSIS — R079 Chest pain, unspecified: Secondary | ICD-10-CM | POA: Diagnosis not present

## 2019-01-27 DIAGNOSIS — E7800 Pure hypercholesterolemia, unspecified: Secondary | ICD-10-CM

## 2019-01-27 NOTE — Progress Notes (Signed)
Cardiology Office Note   Date:  01/27/2019   ID:  Johnathan Severin., DOB [birth year], MRN [id number]  PCP:  Johnathan Mc, MD  Cardiologist:   Johnathan Sacramento, MD   No chief complaint on file.     History of Present Illness: Johnathan Arnold. is a 73 y.o. male who presents for a followup visit.  He has known history of coronary artery disease status post CABG in 1999. He also has known history of hypertension, chronic kidney disease, hyperlipidemia, Parkinson's disease, sleep apnea on CPAP and type 2 diabetes.  Previous carotid Doppler showed mild nonobstructive bilateral disease.  He had worsening exertional dyspnea and chest pain last year.  He underwent a Lexiscan Myoview which showed mild anteroseptal ischemia with normal ejection fraction. A right and left cardiac catheterization was done in August 2019 which showed occluded native arteries with patent grafts including LIMA to LAD, SVG to diagonal, SVG to OM and SVG to distal RCA.  Right heart catheterization showed normal filling pressures, mild pulmonary hypertension and normal cardiac output. He has chronic bilateral exertional leg pain with normal lower extremity arterial Doppler in 2018.  Also he has palpable pulses by exam.  He reports stable exertional chest pain and shortness of breath.  He is now on CPAP for sleep apnea.  He could not exercise at the heart track for 5 months due to COVID.  He resumed that recently.  Past Medical History:  Diagnosis Date  . 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 Diabetes mellitus without complication ([place])   . History of cardiac catheterization 2011   [facility]  . Hyperlipidemia   . Hypertension   . Hypertriglyceridemia   . Parkinson's disease (Johnathan Arnold)   . 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 Vertigo     Past Surgical History:  Procedure Laterality Date  . CARDIAC CATHETERIZATION  05-19-2010   [facility]: Patent grafts. LIMA to LAD, SVG to D1, OM1 and RPDA  . CORONARY ARTERY  BYPASS GRAFT  03/1998   5 vessel, [facility]  . RIGHT/LEFT HEART CATH AND CORONARY ANGIOGRAPHY N/A 02/01/2018   Procedure: RIGHT/LEFT HEART CATH AND CORONARY ANGIOGRAPHY;  Surgeon: Johnathan Hampshire, MD;  Location: [place] [facility];  Service: Cardiovascular;  Laterality: N/A;     Current Outpatient Medications  Medication Sig Dispense Refill  . aspirin 81 MG tablet Take 162 mg by mouth daily. Takes 2 tablets daily.    . carbidopa-levodopa (SINEMET CR) 50-200 MG tablet Take 1 tablet by mouth at bedtime. 30 tablet 5  . carbidopa-levodopa (SINEMET IR) 25-100 MG tablet TAKE ONE TABLET BY MOUTH THREE TIMES A DAY 270 tablet 1  . carvedilol (COREG) 6.25 MG tablet TAKE ONE TABLET BY MOUTH TWICE A DAY 180 tablet 0  . clonazePAM (KLONOPIN) 0.5 MG tablet 1.5 tablets at bedtime 45 tablet 5  . diazepam (VALIUM) 5 MG tablet Take 1 tablet (5 mg total) by mouth every 8 (eight) hours as needed for anxiety (or vertigo). 60 tablet 1  . isosorbide mononitrate (IMDUR) 60 MG 24 hr tablet TAKE ONE TABLET BY MOUTH DAILY 90 tablet 0  . losartan (COZAAR) 25 MG tablet TAKE ONE TABLET BY MOUTH DAILY 90 tablet 1  . Multiple Vitamin (MULTIVITAMIN) tablet Take 1 tablet by mouth daily.      . nitroGLYCERIN (NITROSTAT) 0.4 MG SL tablet Place 1 tablet (0.4 mg total) under the tongue every 5 (five) minutes as needed  for chest pain. 25 tablet 1  . Omega-3 Fatty Acids (FISH OIL) 1200 MG CAPS Take 1 capsule by mouth daily.     Mar[place] Kitchen omeprazole (PRILOSEC) 20 MG capsule TAKE 1 CAPSULE BY MOUTH EVERY MORNING AS DIRECTED 30 capsule 2  . polyethylene glycol (MIRALAX / GLYCOLAX) packet Take 17 g by mouth daily.     . ranolazine (RANEXA) 1000 MG SR tablet TAKE 1 TABLET(1000 MG) BY MOUTH TWICE DAILY 180 tablet 3  . simvastatin (ZOCOR) 20 MG tablet Take 1 tablet (20 mg total) by mouth at bedtime. 90 tablet 3  . tamsulosin (FLOMAX) 0.4 MG CAPS capsule Take 1 capsule (0.4 mg total) by mouth daily. 30 capsule 6   No current  facility-administered medications for this visit.     Allergies:   Patient has no known allergies.    Social History:  The patient  reports that he has never smoked. He has never used smokeless tobacco. He reports current alcohol use. He reports that he does not use drugs.   Family History:  The patient's family history includes Healthy in his son; Heart attack (age of onset: 36) in his father; Heart attack (age of onset: 34) in his mother; Heart disease in his brother and father; Hypertension in his mother.    ROS:  Please see the history of present illness.   Otherwise, review of systems are positive for none.   All other systems are reviewed and negative.    PHYSICAL EXAM: VS:  BP (!) 146/60   Pulse (!) 57   Ht 5\' 6"  (1.676 m)   Wt 190 lb 6.4 oz (86.4 kg)   SpO2 96%   BMI 30.73 kg/m  , BMI Body mass index is 30.73 kg/m. GEN: Well nourished, well developed, in no acute distress  HEENT: normal  Neck: no JVD, carotid bruits, or masses Cardiac: RRR; no murmurs, rubs, or gallops,no edema  Respiratory:  clear to auscultation bilaterally, normal work of breathing GI: soft, nontender, nondistended, + BS MS: no deformity or atrophy  Skin: warm and dry, no rash Neuro:  Strength and sensation are intact Psych: euthymic mood, full affect   EKG:  EKG is ordered today. The ekg ordered today demonstrates sinus bradycardia with nonspecific ST changes.   Recent Labs: 12/20/2018: ALT 11; BUN 21; Creatinine, Ser 1.74; Potassium 5.1; Sodium 138    Lipid Panel    Component Value Date/Time   CHOL 116 05/17/2018 1112   TRIG 102.0 05/17/2018 1112   HDL 53.20 05/17/2018 1112   CHOLHDL 2 05/17/2018 1112   VLDL 20.4 05/17/2018 1112   LDLCALC 42 05/17/2018 1112   LDLDIRECT 38.0 08/06/2015 1118      Wt Readings from Last 3 Encounters:  01/27/19 190 lb 6.4 oz (86.4 kg)  01/25/19 190 lb 3.2 oz (86.3 kg)  12/20/18 185 lb 12.8 oz (84.3 kg)        ASSESSMENT AND PLAN:  1.  Coronary  artery disease involving native coronary arteries with stable angina: Most recent cardiac catheterization in 2019 showed patent grafts.  He does have stable exertional chest pain with evidence of prior anteroseptal ischemia on stress testing.  Given that his negative coronary arteries are occluded, he likely has some ischemia in the proximal LAD distribution that is not bypassed.  Continue Ranexa and Imdur. I am going to repeat his echocardiogram given that we did not do an LV gram last year due to chronic kidney disease.   2. Bilateral carotid artery disease:  Mild nonobstructive disease. Continue treatment of risk factors.  3. Hyperlipidemia: Continue treatment with simvastatin. Most recent LDL was 42  4. Essential hypertension: Blood pressure has been mildly elevated recently.  This could be due to underlying chronic kidney disease.  We could consider adding amlodipine if blood pressure continues to be high.  5. Exertional leg pain: He still has good bilateral pulses and thus significant PAD is doubtful.  Hopefully his symptoms will improve with exercise.    Disposition:   FU with me in 6 months  Signed,  Johnathan Sacramento, MD  01/27/2019 4:12 PM    [facility]

## 2019-01-27 NOTE — Patient Instructions (Signed)
Medication Instructions:  Your physician recommends that you continue on your current medications as directed. Please refer to the Current Medication list given to you today.  If you need a refill on your cardiac medications before your next appointment, please call your pharmacy.   Lab work: None ordered If you have labs (blood work) drawn today and your tests are completely normal, you will receive your results only by: Marland Kitchen MyChart Message (if you have MyChart) OR . A paper copy in the mail If you have any lab test that is abnormal or we need to change your treatment, we will call you to review the results.  Testing/Procedures: Your physician has requested that you have an echocardiogram. Echocardiography is a painless test that uses sound waves to create images of your heart. It provides your doctor with information about the size and shape of your heart and how well your heart's chambers and valves are working. This procedure takes approximately one hour. There are no restrictions for this procedure.    Follow-Up: At [facility], you and your health needs are our priority.  As part of our continuing mission to provide you with exceptional heart care, we have created designated Provider Care Teams.  These Care Teams include your primary Cardiologist (physician) and Advanced Practice Providers (APPs -  Physician Assistants and Nurse Practitioners) who all work together to provide you with the care you need, when you need it. You will need a follow up appointment in 6 months.  Please call our office 2 months in advance to schedule this appointment.  You may see  [name] or one of the following Advanced Practice Providers on your designated Care Team:   Murray Hodgkins, NP Christell Faith, PA-C . Marrianne Mood, PA-C  Any Other Special Instructions Will Be Listed Below (If Applicable). N/A

## 2019-01-28 DIAGNOSIS — M5134 Other intervertebral disc degeneration, thoracic region: Secondary | ICD-10-CM | POA: Diagnosis not present

## 2019-01-28 DIAGNOSIS — M9902 Segmental and somatic dysfunction of thoracic region: Secondary | ICD-10-CM | POA: Diagnosis not present

## 2019-01-28 DIAGNOSIS — M9901 Segmental and somatic dysfunction of cervical region: Secondary | ICD-10-CM | POA: Diagnosis not present

## 2019-01-28 DIAGNOSIS — M62830 Muscle spasm of back: Secondary | ICD-10-CM | POA: Diagnosis not present

## 2019-02-01 ENCOUNTER — Other Ambulatory Visit: Payer: Self-pay

## 2019-02-01 MED ORDER — OMEPRAZOLE 20 MG PO CPDR: DELAYED_RELEASE_CAPSULE | ORAL | 1 refills | Status: DC

## 2019-02-23 ENCOUNTER — Ambulatory Visit (INDEPENDENT_AMBULATORY_CARE_PROVIDER_SITE_OTHER): Payer: Medicare Other

## 2019-02-23 ENCOUNTER — Other Ambulatory Visit: Payer: Self-pay

## 2019-02-23 DIAGNOSIS — R079 Chest pain, unspecified: Secondary | ICD-10-CM | POA: Diagnosis not present

## 2019-02-24 ENCOUNTER — Telehealth: Payer: Self-pay

## 2019-02-24 DIAGNOSIS — I272 Pulmonary hypertension, unspecified: Secondary | ICD-10-CM

## 2019-02-24 MED ORDER — FUROSEMIDE 40 MG PO TABS: 40.0000 mg | ORAL_TABLET | Freq: Every day | ORAL | 5 refills | Status: DC

## 2019-02-24 NOTE — Telephone Encounter (Signed)
Patient calling to discuss recent testing results  ° °Please call  ° °

## 2019-02-24 NOTE — Telephone Encounter (Signed)
-----   Message from Wellington Hampshire, MD sent at 02/24/2019  3:47 PM EDT ----- Inform patient that echo showed normal EF but his pulmonary pressure is significantly higher than what it was last year with evidence of diastolic heart failure.  Schedule him for VQ scan of lungs to make sure no clots causing elevated pressure. Sleep apnea might be contributing as well.  Add Lasix 40 mg daily and check BMP in 1 week.  Schedule follow up in few weeks.   I am forwarding this to Dr. Derrel Nip and Dr. Patsey Berthold for their input as well.

## 2019-02-24 NOTE — Telephone Encounter (Signed)
Patient made aware of echo results and Dr. Tyrell Antonio recommendation. Patient is agreeable with the plan. Patient sts that he has recently started cpap therapy 2 months ago for his sleep apnea.  Lasix 40mg  qd sent to the patient's pharmacy. Patient will come to [place] for a Bmet in 1 week.  Adv the patient that I will call him back once his VQ scan is scheduled and schedule his f/u with [name].

## 2019-02-24 NOTE — Telephone Encounter (Addendum)
Called to give the patient echo results and Dr. Tyrell Antonio recommendation. lmtcb.

## 2019-02-25 DIAGNOSIS — M5134 Other intervertebral disc degeneration, thoracic region: Secondary | ICD-10-CM | POA: Diagnosis not present

## 2019-02-25 DIAGNOSIS — M9901 Segmental and somatic dysfunction of cervical region: Secondary | ICD-10-CM | POA: Diagnosis not present

## 2019-02-25 DIAGNOSIS — M62830 Muscle spasm of back: Secondary | ICD-10-CM | POA: Diagnosis not present

## 2019-02-25 DIAGNOSIS — M9902 Segmental and somatic dysfunction of thoracic region: Secondary | ICD-10-CM | POA: Diagnosis not present

## 2019-02-25 NOTE — Telephone Encounter (Signed)
Patient made aware that his VQ scan is scheduled @ [facility] on 03/03/19 @ 1:30pm. Patient adv that per protocol he will need a cxr prior. Patient will arrive @ 12:30pm to have his cxr completed prior to the VQ.  F/u appt scheduled with [name] on 03/22/19 @ 3:40pm. Patient made aware of the appt date and time.  Patient verbalized understanding to the information given and voiced appreciation for the call.

## 2019-02-25 NOTE — Telephone Encounter (Signed)
-----   Message from Wellington Hampshire, MD sent at 02/24/2019  3:47 PM EDT ----- Inform patient that echo showed normal EF but his pulmonary pressure is significantly higher than what it was last year with evidence of diastolic heart failure.  Schedule him for VQ scan of lungs to make sure no clots causing elevated pressure. Sleep apnea might be contributing as well.  Add Lasix 40 mg daily and check BMP in 1 week.  Schedule follow up in few weeks.   I am forwarding this to Dr. Derrel Nip and Dr. Patsey Berthold for their input as well.

## 2019-02-25 NOTE — Telephone Encounter (Signed)
Patient's VQ scan scheduled for 03/03/19 @ 1:30pm @ [place]. Per protocol the patient will need a Cxr right before the scan (needs to be with in 24 hours). Order in [place].

## 2019-02-28 ENCOUNTER — Other Ambulatory Visit: Payer: Self-pay

## 2019-02-28 ENCOUNTER — Other Ambulatory Visit
Admission: RE | Admit: 2019-02-28 | Discharge: 2019-02-28 | Disposition: A | Discharge status: Home / Self Care | Payer: Medicare Other | Attending: Cardiovascular Disease | Admitting: Cardiovascular Disease

## 2019-02-28 DIAGNOSIS — I272 Pulmonary hypertension, unspecified: Secondary | ICD-10-CM | POA: Insufficient documentation

## 2019-02-28 LAB — BASIC METABOLIC PANEL
Anion gap: 10 (ref 5–15)
BUN: 19 mg/dL (ref 8–23)
CO2: 27 mmol/L (ref 22–32)
Calcium: 9.1 mg/dL (ref 8.9–10.3)
Chloride: 103 mmol/L (ref 98–111)
Creatinine, Ser: 1.61 mg/dL — ABNORMAL HIGH (ref 0.61–1.24)
GFR calc Af Amer: 48 mL/min — ABNORMAL LOW (ref >60)
GFR calc non Af Amer: 42 mL/min — ABNORMAL LOW (ref >60)
Glucose, Bld: 125 mg/dL — ABNORMAL HIGH (ref 70–99)
Potassium: 4.2 mmol/L (ref 3.5–5.1)
Sodium: 140 mmol/L (ref 135–145)

## 2019-03-03 ENCOUNTER — Ambulatory Visit
Admission: RE | Admit: 2019-03-03 | Discharge: 2019-03-03 | Disposition: A | Discharge status: Home / Self Care | Payer: Medicare Other | Source: Ambulatory Visit | Attending: Cardiovascular Disease | Admitting: Cardiovascular Disease

## 2019-03-03 ENCOUNTER — Other Ambulatory Visit: Payer: Self-pay

## 2019-03-03 DIAGNOSIS — I272 Pulmonary hypertension, unspecified: Secondary | ICD-10-CM | POA: Diagnosis not present

## 2019-03-03 DIAGNOSIS — R918 Other nonspecific abnormal finding of lung field: Secondary | ICD-10-CM | POA: Diagnosis not present

## 2019-03-03 DIAGNOSIS — I517 Cardiomegaly: Secondary | ICD-10-CM | POA: Diagnosis not present

## 2019-03-03 MED ORDER — TECHNETIUM TO 99M ALBUMIN AGGREGATED
4.0000 | Freq: Once | INTRAVENOUS | Status: AC | PRN
  Administered 2019-03-03: 3.85 via INTRAVENOUS

## 2019-03-03 MED ORDER — TECHNETIUM TC 99M DIETHYLENETRIAME-PENTAACETIC ACID
30.0000 | Freq: Once | INTRAVENOUS | Status: AC | PRN
  Administered 2019-03-03: 33.18 via INTRAVENOUS

## 2019-03-07 ENCOUNTER — Telehealth: Payer: Self-pay

## 2019-03-07 NOTE — Telephone Encounter (Signed)
Called to give the patient results of his VQ scan. lmtcb with the patient's wife.

## 2019-03-07 NOTE — Telephone Encounter (Signed)
-----   Message from Wellington Hampshire, MD sent at 03/07/2019  1:42 PM EDT ----- Inform patient that vq scan was normal.

## 2019-03-08 NOTE — Telephone Encounter (Signed)
Patient made aware of results with verbalized understanding. 

## 2019-03-09 ENCOUNTER — Other Ambulatory Visit: Payer: Self-pay

## 2019-03-09 ENCOUNTER — Encounter: Payer: Self-pay | Admitting: Pulmonary Disease

## 2019-03-09 ENCOUNTER — Ambulatory Visit (INDEPENDENT_AMBULATORY_CARE_PROVIDER_SITE_OTHER): Payer: Medicare Other | Admitting: Pulmonary Disease

## 2019-03-09 VITALS — BP 126/80 | HR 55 | Temp 97.9°F | Ht 66.0 in | Wt 187.2 lb

## 2019-03-09 DIAGNOSIS — G4761 Periodic limb movement disorder: Secondary | ICD-10-CM

## 2019-03-09 DIAGNOSIS — G20 Parkinson's disease: Secondary | ICD-10-CM

## 2019-03-09 DIAGNOSIS — R911 Solitary pulmonary nodule: Secondary | ICD-10-CM

## 2019-03-09 DIAGNOSIS — G4730 Sleep apnea, unspecified: Secondary | ICD-10-CM

## 2019-03-09 DIAGNOSIS — Z23 Encounter for immunization: Secondary | ICD-10-CM

## 2019-03-09 NOTE — Patient Instructions (Signed)
1.  Recommend taking 400 mg of MAGNESIUM GLYCINATE every evening to see if this helps with your restless legs.  You can get this supplement at stores like [employer] or other health food stores.  2.  Continue using your CPAP as you are currently doing.  3.  We will see you after follow-up with CT scan of the chest in December.

## 2019-03-14 DIAGNOSIS — Z85828 Personal history of other malignant neoplasm of skin: Secondary | ICD-10-CM | POA: Diagnosis not present

## 2019-03-14 DIAGNOSIS — Z08 Encounter for follow-up examination after completed treatment for malignant neoplasm: Secondary | ICD-10-CM | POA: Diagnosis not present

## 2019-03-14 DIAGNOSIS — D2271 Melanocytic nevi of right lower limb, including hip: Secondary | ICD-10-CM | POA: Diagnosis not present

## 2019-03-14 DIAGNOSIS — D225 Melanocytic nevi of trunk: Secondary | ICD-10-CM | POA: Diagnosis not present

## 2019-03-14 DIAGNOSIS — L821 Other seborrheic keratosis: Secondary | ICD-10-CM | POA: Diagnosis not present

## 2019-03-14 DIAGNOSIS — L570 Actinic keratosis: Secondary | ICD-10-CM | POA: Diagnosis not present

## 2019-03-14 DIAGNOSIS — D2261 Melanocytic nevi of right upper limb, including shoulder: Secondary | ICD-10-CM | POA: Diagnosis not present

## 2019-03-14 DIAGNOSIS — X32XXXA Exposure to sunlight, initial encounter: Secondary | ICD-10-CM | POA: Diagnosis not present

## 2019-03-14 DIAGNOSIS — D2272 Melanocytic nevi of left lower limb, including hip: Secondary | ICD-10-CM | POA: Diagnosis not present

## 2019-03-14 DIAGNOSIS — D2262 Melanocytic nevi of left upper limb, including shoulder: Secondary | ICD-10-CM | POA: Diagnosis not present

## 2019-03-17 ENCOUNTER — Other Ambulatory Visit: Payer: Self-pay | Admitting: Urology

## 2019-03-17 ENCOUNTER — Other Ambulatory Visit: Payer: Self-pay | Admitting: Cardiovascular Disease

## 2019-03-17 DIAGNOSIS — N401 Enlarged prostate with lower urinary tract symptoms: Secondary | ICD-10-CM

## 2019-03-18 DIAGNOSIS — M9902 Segmental and somatic dysfunction of thoracic region: Secondary | ICD-10-CM | POA: Diagnosis not present

## 2019-03-18 DIAGNOSIS — M9901 Segmental and somatic dysfunction of cervical region: Secondary | ICD-10-CM | POA: Diagnosis not present

## 2019-03-18 DIAGNOSIS — M5134 Other intervertebral disc degeneration, thoracic region: Secondary | ICD-10-CM | POA: Diagnosis not present

## 2019-03-18 DIAGNOSIS — M62830 Muscle spasm of back: Secondary | ICD-10-CM | POA: Diagnosis not present

## 2019-03-22 ENCOUNTER — Other Ambulatory Visit: Payer: Self-pay

## 2019-03-22 ENCOUNTER — Ambulatory Visit (INDEPENDENT_AMBULATORY_CARE_PROVIDER_SITE_OTHER): Payer: Medicare Other | Admitting: Cardiovascular Disease

## 2019-03-22 ENCOUNTER — Encounter: Payer: Self-pay | Admitting: Cardiovascular Disease

## 2019-03-22 VITALS — BP 130/60 | HR 52 | Temp 97.9°F | Ht 66.0 in | Wt 188.0 lb

## 2019-03-22 DIAGNOSIS — I2510 Atherosclerotic heart disease of native coronary artery without angina pectoris: Secondary | ICD-10-CM | POA: Diagnosis not present

## 2019-03-22 DIAGNOSIS — E7800 Pure hypercholesterolemia, unspecified: Secondary | ICD-10-CM | POA: Diagnosis not present

## 2019-03-22 DIAGNOSIS — I272 Pulmonary hypertension, unspecified: Secondary | ICD-10-CM

## 2019-03-22 DIAGNOSIS — I10 Essential (primary) hypertension: Secondary | ICD-10-CM

## 2019-03-22 DIAGNOSIS — I779 Disorder of arteries and arterioles, unspecified: Secondary | ICD-10-CM | POA: Diagnosis not present

## 2019-03-22 NOTE — Patient Instructions (Signed)
Medication Instructions:  Your physician recommends that you continue on your current medications as directed. Please refer to the Current Medication list given to you today.  If you need a refill on your cardiac medications before your next appointment, please call your pharmacy.   Lab work: Your physician recommends that you return for lab work on 03/31/19 (bmet, cbc) to be drawn at the [facility] medical mall.  You will need a COVID test on that day. Please drive through the [place] drive test site on [id number] between 12:30-2:30pm  If you have labs (blood work) drawn today and your tests are completely normal, you will receive your results only by: Marland Kitchen MyChart Message (if you have MyChart) OR . A paper copy in the mail If you have any lab test that is abnormal or we need to change your treatment, we will call you to review the results.  Testing/Procedures: Your physician has requested that you have a cardiac catheterization. Cardiac catheterization is used to diagnose and/or treat various heart conditions. Doctors may recommend this procedure for a number of different reasons. The most common reason is to evaluate chest pain. Chest pain can be a symptom of coronary artery disease (CAD), and cardiac catheterization can show whether plaque is narrowing or blocking your heart's arteries. This procedure is also used to evaluate the valves, as well as measure the blood flow and oxygen levels in different parts of your heart. For further information please visit [email or web address]. Please follow instruction sheet, as given.    Follow-Up: At [facility], you and your health needs are our priority.  As part of our continuing mission to provide you with exceptional heart care, we have created designated Provider Care Teams.  These Care Teams include your primary Cardiologist (physician) and Advanced Practice Providers (APPs -  Physician Assistants and Nurse Practitioners) who all work  together to provide you with the care you need, when you need it. You will need a follow up appointment in 4 weeks.  You may see No primary care provider on file. [name]  or one of the following Advanced Practice Providers on your designated Care Team:   Murray Hodgkins, NP Christell Faith, PA-C . Marrianne Mood, PA-C  Any Other Special Instructions Will Be Listed Below (If Applicable).  [facility] Cardiac Cath Instructions   You are scheduled for a Cardiac Cath on:___10/26/20 @ 9:30am______________________  Please arrive at __8:30_____am on the day of your procedure  Please expect a call from our [place] to pre-register you  Do not eat/drink anything after midnight  Someone will need to drive you home  It is recommended someone be with you for the first 24 hours after your procedure  Wear clothes that are easy to get on/off and wear slip on shoes if possible   Medications bring a current list of all medications with you  X__ Do not take these medications before your procedure:____Furosemide____________________________________  _X__ You may take the rest of your medications the morning of your procedure with enough water to swallow safely  _______________________________________________________________________________________________________________________________________________________________________   Day of your procedure: Arrive at the [place] entrance.  Free valet service is available.  After entering the [place] please check-in at the registration desk (1st desk on your right) to receive your armband. After receiving your armband someone will escort you to the cardiac cath/special procedures waiting area.  The usual length of stay after your procedure is about 2 to 3 hours.  This can vary.  If you have any questions, please call our office at [phone], or you may call the cardiac cath lab at [facility] directly at [phone]

## 2019-03-22 NOTE — Progress Notes (Signed)
Cardiology Office Note   Date:  03/22/2019   ID:  Johnathan Pesch., DOB 1945/12/11, MRN [id number]  PCP:  [name], MD  Cardiologist:   [name], MD   Chief Complaint  Patient presents with  . other    Follow up after VQ scan. Meds reviewed by the pt. verbally. "doing well."       History of Present Illness: Johnathan Szwed. is a 73 y.o. male who presents for a followup visit.  He has known history of coronary artery disease status post CABG in 1999. He also has known history of hypertension, chronic kidney disease, hyperlipidemia, Parkinson's disease, sleep apnea on CPAP and type 2 diabetes.  Previous carotid Doppler showed mild nonobstructive bilateral disease.  He had worsening exertional dyspnea and chest pain last year.  He underwent a Lexiscan Myoview which showed mild anteroseptal ischemia with normal ejection fraction. A right and left cardiac catheterization was done in August 2019 which showed occluded native arteries with patent grafts including LIMA to LAD, SVG to diagonal, SVG to OM and SVG to distal RCA.  Right heart catheterization showed normal filling pressures, mild pulmonary hypertension and normal cardiac output. He has chronic bilateral exertional leg pain with normal lower extremity arterial Doppler in 2018.  Also he has palpable pulses by exam. He again had worsening exertional dyspnea recently and thus I repeated his echocardiogram in September which showed normal ejection fraction, grade 2 diastolic dysfunction, moderately dilated right ventricle with severe pulmonary hypertension.  Estimated pulmonary systolic pressure was 61 mmHg.  IVC was dilated with less than 50% respiratory variability.  Based on this, I added furosemide 40 mg once daily.  I scheduled him for VQ scan which showed no evidence of acute or chronic pulmonary embolism. He continues to have exertional dyspnea but it is stable overall.  No chest pain.  Past Medical History:   Diagnosis Date  . 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 Diabetes mellitus without complication ([place])   . History of cardiac catheterization 2011   [facility]  . Hyperlipidemia   . Hypertension   . Hypertriglyceridemia   . Parkinson's disease ([place])   . 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 Vertigo     Past Surgical History:  Procedure Laterality Date  . CARDIAC CATHETERIZATION  05-19-2010   [facility]: Patent grafts. LIMA to LAD, SVG to D1, OM1 and RPDA  . CORONARY ARTERY BYPASS GRAFT  03/1998   5 vessel, [facility]  . RIGHT/LEFT HEART CATH AND CORONARY ANGIOGRAPHY N/A 02/01/2018   Procedure: RIGHT/LEFT HEART CATH AND CORONARY ANGIOGRAPHY;  Surgeon: Wellington Hampshire, MD;  Location: [place] [facility];  Service: Cardiovascular;  Laterality: N/A;     Current Outpatient Medications  Medication Sig Dispense Refill  . aspirin 81 MG tablet Take 162 mg by mouth daily. Takes 2 tablets daily.    . carbidopa-levodopa (SINEMET CR) 50-200 MG tablet Take 1 tablet by mouth at bedtime. 30 tablet 5  . carbidopa-levodopa (SINEMET IR) 25-100 MG tablet TAKE ONE TABLET BY MOUTH THREE TIMES A DAY 270 tablet 1  . carvedilol (COREG) 6.25 MG tablet TAKE ONE TABLET BY MOUTH TWICE A DAY 180 tablet 0  . clonazePAM (KLONOPIN) 0.5 MG tablet 1.5 tablets at bedtime 45 tablet 5  . diazepam (VALIUM) 5 MG tablet Take 1 tablet (5 mg total) by mouth every 8 (eight) hours as needed for anxiety (or vertigo). 60 tablet  1  . furosemide (LASIX) 40 MG tablet Take 1 tablet (40 mg total) by mouth daily. 30 tablet 5  . isosorbide mononitrate (IMDUR) 60 MG 24 hr tablet TAKE ONE TABLET BY MOUTH DAILY 90 tablet 0  . losartan (COZAAR) 25 MG tablet TAKE ONE TABLET BY MOUTH DAILY 90 tablet 1  . Multiple Vitamin (MULTIVITAMIN) tablet Take 1 tablet by mouth daily.      . nitroGLYCERIN (NITROSTAT) 0.4 MG SL tablet Place 1 tablet (0.4 mg total) under the tongue every 5 (five) minutes as needed for chest pain. 25 tablet 1  . Omega-3  Fatty Acids (FISH OIL) 1200 MG CAPS Take 1 capsule by mouth daily.     Marland Kitchen omeprazole (PRILOSEC) 20 MG capsule TAKE 1 CAPSULE BY MOUTH EVERY MORNING AS DIRECTED 90 capsule 1  . polyethylene glycol (MIRALAX / GLYCOLAX) packet Take 17 g by mouth daily.     . ranolazine (RANEXA) 1000 MG SR tablet TAKE 1 TABLET(1000 MG) BY MOUTH TWICE DAILY 180 tablet 3  . simvastatin (ZOCOR) 20 MG tablet Take 1 tablet (20 mg total) by mouth at bedtime. 90 tablet 3  . tamsulosin (FLOMAX) 0.4 MG CAPS capsule Take 1 capsule (0.4 mg total) by mouth daily. 90 capsule 3   No current facility-administered medications for this visit.     Allergies:   Patient has no known allergies.    Social History:  The patient  reports that he has never smoked. He has never used smokeless tobacco. He reports current alcohol use. He reports that he does not use drugs.   Family History:  The patient's family history includes Healthy in his son; Heart attack (age of onset: 35) in his father; Heart attack (age of onset: 38) in his mother; Heart disease in his brother and father; Hypertension in his mother.    ROS:  Please see the history of present illness.   Otherwise, review of systems are positive for none.   All other systems are reviewed and negative.    PHYSICAL EXAM: VS:  BP 130/60 (BP Location: Left Arm, Patient Position: Sitting, Cuff Size: Normal)   Pulse (!) 52   Temp 97.9 F (36.6 C)   Ht 5\' 6"  (1.676 m)   Wt 188 lb (85.3 kg)   BMI 30.34 kg/m  , BMI Body mass index is 30.34 kg/m. GEN: Well nourished, well developed, in no acute distress  HEENT: normal  Neck: no JVD, carotid bruits, or masses Cardiac: RRR; no murmurs, rubs, or gallops,no edema  Respiratory:  clear to auscultation bilaterally, normal work of breathing GI: soft, nontender, nondistended, + BS MS: no deformity or atrophy  Skin: warm and dry, no rash Neuro:  Strength and sensation are intact Psych: euthymic mood, full affect   EKG:  EKG is ordered  today. The ekg ordered today demonstrates sinus bradycardia with nonspecific ST changes.   Recent Labs: 12/20/2018: ALT 11 02/28/2019: BUN 19; Creatinine, Ser 1.61; Potassium 4.2; Sodium 140    Lipid Panel    Component Value Date/Time   CHOL 116 05/17/2018 1112   TRIG 102.0 05/17/2018 1112   HDL 53.20 05/17/2018 1112   CHOLHDL 2 05/17/2018 1112   VLDL 20.4 05/17/2018 1112   LDLCALC 42 05/17/2018 1112   LDLDIRECT 38.0 08/06/2015 1118      Wt Readings from Last 3 Encounters:  03/22/19 188 lb (85.3 kg)  03/09/19 187 lb 3.2 oz (84.9 kg)  01/27/19 190 lb 6.4 oz (86.4 kg)  ASSESSMENT AND PLAN:  1.  Coronary artery disease involving native coronary arteries with stable angina: Most recent cardiac catheterization in 2019 showed patent grafts.  He likely has some ischemia in the proximal LAD distribution that is not bypassed.  Continue Ranexa and Imdur.  2.  Severe pulmonary hypertension: This was found recently on echocardiogram.  Right heart catheterization last year showed only mild pulmonary hypertension so the exact etiology of worsening is not entirely clear.  VQ scan showed no evidence of pulmonary embolism.  I have discussed with Dr. Patsey Berthold and I think we have to proceed with a right heart catheterization to evaluate the etiology of pulmonary hypertension.  I discussed the procedure in details as well as risks and benefits.   3. Bilateral carotid artery disease:  Mild nonobstructive disease. Continue treatment of risk factors.  4. Hyperlipidemia: Continue treatment with simvastatin. Most recent LDL was 42  5. Essential hypertension: Blood pressure is controlled.  6. Exertional leg pain: He still has good bilateral pulses and thus significant PAD is doubtful.     Disposition:   FU with me in 1 months  Signed,  [name], MD  03/22/2019 1:35 PM    [place]

## 2019-03-22 NOTE — H&P (View-Only) (Signed)
Cardiology Office Note   Date:  03/22/2019   ID:  Johnathan Arnold., DOB Aug 16, 1945, MRN [id number]  PCP:  Johnathan Mc, MD  Cardiologist:   [name], MD   Chief Complaint  Patient presents with  . other    Follow up after VQ scan. Meds reviewed by the pt. verbally. "doing well."       History of Present Illness: Johnathan Arnold. is a 73 y.o. male who presents for a followup visit.  He has known history of coronary artery disease status post CABG in 1999. He also has known history of hypertension, chronic kidney disease, hyperlipidemia, Parkinson's disease, sleep apnea on CPAP and type 2 diabetes.  Previous carotid Doppler showed mild nonobstructive bilateral disease.  He had worsening exertional dyspnea and chest pain last year.  He underwent a Lexiscan Myoview which showed mild anteroseptal ischemia with normal ejection fraction. A right and left cardiac catheterization was done in August 2019 which showed occluded native arteries with patent grafts including LIMA to LAD, SVG to diagonal, SVG to OM and SVG to distal RCA.  Right heart catheterization showed normal filling pressures, mild pulmonary hypertension and normal cardiac output. He has chronic bilateral exertional leg pain with normal lower extremity arterial Doppler in 2018.  Also he has palpable pulses by exam. He again had worsening exertional dyspnea recently and thus I repeated his echocardiogram in September which showed normal ejection fraction, grade 2 diastolic dysfunction, moderately dilated right ventricle with severe pulmonary hypertension.  Estimated pulmonary systolic pressure was 61 mmHg.  IVC was dilated with less than 50% respiratory variability.  Based on this, I added furosemide 40 mg once daily.  I scheduled him for VQ scan which showed no evidence of acute or chronic pulmonary embolism. He continues to have exertional dyspnea but it is stable overall.  No chest pain.  Past Medical History:   Diagnosis Date  . 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 Diabetes mellitus without complication ([place])   . History of cardiac catheterization 2011   [facility]  . Hyperlipidemia   . Hypertension   . Hypertriglyceridemia   . Parkinson's disease ([place])   . 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 Vertigo     Past Surgical History:  Procedure Laterality Date  . CARDIAC CATHETERIZATION  05-19-2010   [facility]: Patent grafts. LIMA to LAD, SVG to D1, OM1 and RPDA  . CORONARY ARTERY BYPASS GRAFT  03/1998   5 vessel, [facility]  . RIGHT/LEFT HEART CATH AND CORONARY ANGIOGRAPHY N/A 02/01/2018   Procedure: RIGHT/LEFT HEART CATH AND CORONARY ANGIOGRAPHY;  Surgeon: Johnathan Hampshire, MD;  Location: [facility];  Service: Cardiovascular;  Laterality: N/A;     Current Outpatient Medications  Medication Sig Dispense Refill  . aspirin 81 MG tablet Take 162 mg by mouth daily. Takes 2 tablets daily.    . carbidopa-levodopa (SINEMET CR) 50-200 MG tablet Take 1 tablet by mouth at bedtime. 30 tablet 5  . carbidopa-levodopa (SINEMET IR) 25-100 MG tablet TAKE ONE TABLET BY MOUTH THREE TIMES A DAY 270 tablet 1  . carvedilol (COREG) 6.25 MG tablet TAKE ONE TABLET BY MOUTH TWICE A DAY 180 tablet 0  . clonazePAM (KLONOPIN) 0.5 MG tablet 1.5 tablets at bedtime 45 tablet 5  . diazepam (VALIUM) 5 MG tablet Take 1 tablet (5 mg total) by mouth every 8 (eight) hours as needed for anxiety (or vertigo). 60 tablet  1  . furosemide (LASIX) 40 MG tablet Take 1 tablet (40 mg total) by mouth daily. 30 tablet 5  . isosorbide mononitrate (IMDUR) 60 MG 24 hr tablet TAKE ONE TABLET BY MOUTH DAILY 90 tablet 0  . losartan (COZAAR) 25 MG tablet TAKE ONE TABLET BY MOUTH DAILY 90 tablet 1  . Multiple Vitamin (MULTIVITAMIN) tablet Take 1 tablet by mouth daily.      . nitroGLYCERIN (NITROSTAT) 0.4 MG SL tablet Place 1 tablet (0.4 mg total) under the tongue every 5 (five) minutes as needed for chest pain. 25 tablet 1  . Omega-3  Fatty Acids (FISH OIL) 1200 MG CAPS Take 1 capsule by mouth daily.     Marland Kitchen omeprazole (PRILOSEC) 20 MG capsule TAKE 1 CAPSULE BY MOUTH EVERY MORNING AS DIRECTED 90 capsule 1  . polyethylene glycol (MIRALAX / GLYCOLAX) packet Take 17 g by mouth daily.     . ranolazine (RANEXA) 1000 MG SR tablet TAKE 1 TABLET(1000 MG) BY MOUTH TWICE DAILY 180 tablet 3  . simvastatin (ZOCOR) 20 MG tablet Take 1 tablet (20 mg total) by mouth at bedtime. 90 tablet 3  . tamsulosin (FLOMAX) 0.4 MG CAPS capsule Take 1 capsule (0.4 mg total) by mouth daily. 90 capsule 3   No current facility-administered medications for this visit.     Allergies:   Patient has no known allergies.    Social History:  The patient  reports that he has never smoked. He has never used smokeless tobacco. He reports current alcohol use. He reports that he does not use drugs.   Family History:  The patient's family history includes Healthy in his son; Heart attack (age of onset: 68) in his father; Heart attack (age of onset: 37) in his mother; Heart disease in his brother and father; Hypertension in his mother.    ROS:  Please see the history of present illness.   Otherwise, review of systems are positive for none.   All other systems are reviewed and negative.    PHYSICAL EXAM: VS:  BP 130/60 (BP Location: Left Arm, Patient Position: Sitting, Cuff Size: Normal)   Pulse (!) 52   Temp 97.9 F (36.6 C)   Ht 5\' 6"  (1.676 m)   Wt 188 lb (85.3 kg)   BMI 30.34 kg/m  , BMI Body mass index is 30.34 kg/m. GEN: Well nourished, well developed, in no acute distress  HEENT: normal  Neck: no JVD, carotid bruits, or masses Cardiac: RRR; no murmurs, rubs, or gallops,no edema  Respiratory:  clear to auscultation bilaterally, normal work of breathing GI: soft, nontender, nondistended, + BS MS: no deformity or atrophy  Skin: warm and dry, no rash Neuro:  Strength and sensation are intact Psych: euthymic mood, full affect   EKG:  EKG is ordered  today. The ekg ordered today demonstrates sinus bradycardia with nonspecific ST changes.   Recent Labs: 12/20/2018: ALT 11 02/28/2019: BUN 19; Creatinine, Ser 1.61; Potassium 4.2; Sodium 140    Lipid Panel    Component Value Date/Time   CHOL 116 05/17/2018 1112   TRIG 102.0 05/17/2018 1112   HDL 53.20 05/17/2018 1112   CHOLHDL 2 05/17/2018 1112   VLDL 20.4 05/17/2018 1112   LDLCALC 42 05/17/2018 1112   LDLDIRECT 38.0 08/06/2015 1118      Wt Readings from Last 3 Encounters:  03/22/19 188 lb (85.3 kg)  03/09/19 187 lb 3.2 oz (84.9 kg)  01/27/19 190 lb 6.4 oz (86.4 kg)  ASSESSMENT AND PLAN:  1.  Coronary artery disease involving native coronary arteries with stable angina: Most recent cardiac catheterization in 2019 showed patent grafts.  He likely has some ischemia in the proximal LAD distribution that is not bypassed.  Continue Ranexa and Imdur.  2.  Severe pulmonary hypertension: This was found recently on echocardiogram.  Right heart catheterization last year showed only mild pulmonary hypertension so the exact etiology of worsening is not entirely clear.  VQ scan showed no evidence of pulmonary embolism.  I have discussed with Dr. Patsey Berthold and I think we have to proceed with a right heart catheterization to evaluate the etiology of pulmonary hypertension.  I discussed the procedure in details as well as risks and benefits.   3. Bilateral carotid artery disease:  Mild nonobstructive disease. Continue treatment of risk factors.  4. Hyperlipidemia: Continue treatment with simvastatin. Most recent LDL was 42  5. Essential hypertension: Blood pressure is controlled.  6. Exertional leg pain: He still has good bilateral pulses and thus significant PAD is doubtful.     Disposition:   FU with me in 1 months  Signed,  [name], MD  03/22/2019 1:35 PM    [place]

## 2019-03-31 ENCOUNTER — Other Ambulatory Visit: Payer: Self-pay

## 2019-03-31 ENCOUNTER — Other Ambulatory Visit
Admission: RE | Admit: 2019-03-31 | Discharge: 2019-03-31 | Disposition: A | Discharge status: Home / Self Care | Payer: Medicare Other | Source: Ambulatory Visit | Attending: Cardiovascular Disease | Admitting: Cardiovascular Disease

## 2019-03-31 ENCOUNTER — Other Ambulatory Visit: Payer: Self-pay | Admitting: Cardiovascular Disease

## 2019-03-31 DIAGNOSIS — Z20828 Contact with and (suspected) exposure to other viral communicable diseases: Secondary | ICD-10-CM | POA: Diagnosis not present

## 2019-03-31 DIAGNOSIS — I2510 Atherosclerotic heart disease of native coronary artery without angina pectoris: Secondary | ICD-10-CM

## 2019-03-31 DIAGNOSIS — I272 Pulmonary hypertension, unspecified: Secondary | ICD-10-CM

## 2019-03-31 DIAGNOSIS — Z01812 Encounter for preprocedural laboratory examination: Secondary | ICD-10-CM | POA: Insufficient documentation

## 2019-03-31 LAB — BASIC METABOLIC PANEL
Anion gap: 10 (ref 5–15)
BUN: 19 mg/dL (ref 8–23)
CO2: 27 mmol/L (ref 22–32)
Calcium: 9.5 mg/dL (ref 8.9–10.3)
Chloride: 103 mmol/L (ref 98–111)
Creatinine, Ser: 1.76 mg/dL — ABNORMAL HIGH (ref 0.61–1.24)
GFR calc Af Amer: 43 mL/min — ABNORMAL LOW (ref >60)
GFR calc non Af Amer: 37 mL/min — ABNORMAL LOW (ref >60)
Glucose, Bld: 88 mg/dL (ref 70–99)
Potassium: 4.8 mmol/L (ref 3.5–5.1)
Sodium: 140 mmol/L (ref 135–145)

## 2019-03-31 LAB — CBC WITH DIFFERENTIAL/PLATELET
Abs Immature Granulocytes: 0.01 10*3/uL (ref 0.00–0.07)
Basophils Absolute: 0 10*3/uL (ref 0.0–0.1)
Basophils Relative: 1 %
Eosinophils Absolute: 0 10*3/uL (ref 0.0–0.5)
Eosinophils Relative: 1 %
HCT: 33.4 % — ABNORMAL LOW (ref 39.0–52.0)
Hemoglobin: 11.3 g/dL — ABNORMAL LOW (ref 13.0–17.0)
Immature Granulocytes: 0 %
Lymphocytes Relative: 43 %
Lymphs Abs: 1.7 10*3/uL (ref 0.7–4.0)
MCH: 33.6 pg (ref 26.0–34.0)
MCHC: 33.8 g/dL (ref 30.0–36.0)
MCV: 99.4 fL (ref 80.0–100.0)
Monocytes Absolute: 0.3 10*3/uL (ref 0.1–1.0)
Monocytes Relative: 7 %
Neutro Abs: 1.9 10*3/uL (ref 1.7–7.7)
Neutrophils Relative %: 48 %
Platelets: 137 10*3/uL — ABNORMAL LOW (ref 150–400)
RBC: 3.36 MIL/uL — ABNORMAL LOW (ref 4.22–5.81)
RDW: 11.9 % (ref 11.5–15.5)
WBC: 4 10*3/uL (ref 4.0–10.5)
nRBC: 0 % (ref 0.0–0.2)

## 2019-03-31 LAB — SARS CORONAVIRUS 2 (TAT 6-24 HRS): SARS Coronavirus 2: NEGATIVE

## 2019-04-01 ENCOUNTER — Other Ambulatory Visit: Payer: Medicare Other

## 2019-04-04 ENCOUNTER — Encounter: Payer: Self-pay | Admitting: *Deleted

## 2019-04-04 ENCOUNTER — Other Ambulatory Visit: Payer: Self-pay

## 2019-04-04 ENCOUNTER — Encounter
Admission: RE | Disposition: A | Discharge status: Home / Self Care | Payer: Self-pay | Source: Home / Self Care | Attending: Cardiovascular Disease

## 2019-04-04 ENCOUNTER — Ambulatory Visit
Admission: RE | Admit: 2019-04-04 | Discharge: 2019-04-04 | Disposition: A | Discharge status: Home / Self Care | Payer: Medicare Other | Attending: Cardiovascular Disease | Admitting: Cardiovascular Disease

## 2019-04-04 DIAGNOSIS — I1310 Hypertensive heart and chronic kidney disease without heart failure, with stage 1 through stage 4 chronic kidney disease, or unspecified chronic kidney disease: Secondary | ICD-10-CM | POA: Diagnosis not present

## 2019-04-04 DIAGNOSIS — M79605 Pain in left leg: Secondary | ICD-10-CM | POA: Insufficient documentation

## 2019-04-04 DIAGNOSIS — Z79899 Other long term (current) drug therapy: Secondary | ICD-10-CM | POA: Diagnosis not present

## 2019-04-04 DIAGNOSIS — N189 Chronic kidney disease, unspecified: Secondary | ICD-10-CM | POA: Diagnosis not present

## 2019-04-04 DIAGNOSIS — E1122 Type 2 diabetes mellitus with diabetic chronic kidney disease: Secondary | ICD-10-CM | POA: Diagnosis not present

## 2019-04-04 DIAGNOSIS — E785 Hyperlipidemia, unspecified: Secondary | ICD-10-CM | POA: Insufficient documentation

## 2019-04-04 DIAGNOSIS — Z951 Presence of aortocoronary bypass graft: Secondary | ICD-10-CM | POA: Insufficient documentation

## 2019-04-04 DIAGNOSIS — M79604 Pain in right leg: Secondary | ICD-10-CM | POA: Diagnosis not present

## 2019-04-04 DIAGNOSIS — Z8249 Family history of ischemic heart disease and other diseases of the circulatory system: Secondary | ICD-10-CM | POA: Diagnosis not present

## 2019-04-04 DIAGNOSIS — I6523 Occlusion and stenosis of bilateral carotid arteries: Secondary | ICD-10-CM | POA: Insufficient documentation

## 2019-04-04 DIAGNOSIS — Z7982 Long term (current) use of aspirin: Secondary | ICD-10-CM | POA: Diagnosis not present

## 2019-04-04 DIAGNOSIS — G20 Parkinson's disease: Secondary | ICD-10-CM | POA: Insufficient documentation

## 2019-04-04 DIAGNOSIS — R0609 Other forms of dyspnea: Secondary | ICD-10-CM | POA: Diagnosis not present

## 2019-04-04 DIAGNOSIS — E781 Pure hyperglyceridemia: Secondary | ICD-10-CM | POA: Diagnosis not present

## 2019-04-04 DIAGNOSIS — I272 Pulmonary hypertension, unspecified: Secondary | ICD-10-CM | POA: Diagnosis not present

## 2019-04-04 HISTORY — PX: RIGHT HEART CATH: CATH118263

## 2019-04-04 LAB — GLUCOSE, CAPILLARY: Glucose-Capillary: 105 mg/dL — ABNORMAL HIGH (ref 70–99)

## 2019-04-04 SURGERY — RIGHT HEART CATH
Anesthesia: Moderate Sedation

## 2019-04-04 MED ORDER — ASPIRIN 81 MG PO CHEW
81.0000 mg | CHEWABLE_TABLET | ORAL | Status: AC
  Administered 2019-04-04: 09:00:00 81 mg via ORAL

## 2019-04-04 MED ORDER — SODIUM CHLORIDE 0.9 % IV SOLN: 250.0000 mL | INTRAVENOUS | Status: DC | PRN

## 2019-04-04 MED ORDER — HEPARIN (PORCINE) IN NACL 1000-0.9 UT/500ML-% IV SOLN
INTRAVENOUS | Status: AC
  Filled 2019-04-04: qty 500

## 2019-04-04 MED ORDER — SODIUM CHLORIDE 0.9% FLUSH: 3.0000 mL | INTRAVENOUS | Status: DC | PRN

## 2019-04-04 MED ORDER — SODIUM CHLORIDE 0.9% FLUSH: 3.0000 mL | Freq: Two times a day (BID) | INTRAVENOUS | Status: DC

## 2019-04-04 MED ORDER — MIDAZOLAM HCL 2 MG/2ML IJ SOLN
INTRAMUSCULAR | Status: DC | PRN
  Administered 2019-04-04 (×2): 1 mg via INTRAVENOUS

## 2019-04-04 MED ORDER — HEPARIN (PORCINE) IN NACL 1000-0.9 UT/500ML-% IV SOLN
INTRAVENOUS | Status: DC | PRN
  Administered 2019-04-04: 500 mL

## 2019-04-04 MED ORDER — ASPIRIN 81 MG PO CHEW
CHEWABLE_TABLET | ORAL | Status: AC
  Filled 2019-04-04: qty 1

## 2019-04-04 MED ORDER — SODIUM CHLORIDE 0.9 % IV SOLN
INTRAVENOUS | Status: DC
  Administered 2019-04-04: 09:00:00 via INTRAVENOUS

## 2019-04-04 MED ORDER — MIDAZOLAM HCL 2 MG/2ML IJ SOLN
INTRAMUSCULAR | Status: AC
  Filled 2019-04-04: qty 2

## 2019-04-04 SURGICAL SUPPLY — 5 items
CATH BALLN WEDGE 5F 110CM (CATHETERS) ×1 IMPLANT
KIT MANI 3VAL PERCEP (MISCELLANEOUS) ×2 IMPLANT
KIT RIGHT HEART (MISCELLANEOUS) ×2 IMPLANT
PACK CARDIAC CATH (CUSTOM PROCEDURE TRAY) ×2 IMPLANT
SHEATH GLIDE SLENDER 4/5FR (SHEATH) ×1 IMPLANT

## 2019-04-04 NOTE — Interval H&P Note (Signed)
History and Physical Interval Note:  04/04/2019 9:40 AM  [name].  has presented today for surgery, with the diagnosis of RT Cath    Diagnostic purposes per Dr Fletcher Anon    Pulmonary hypertension.  The various methods of treatment have been discussed with the patient and family. After consideration of risks, benefits and other options for treatment, the patient has consented to  Procedure(s): RIGHT HEART CATH (N/A) as a surgical intervention.  The patient's history has been reviewed, patient examined, no change in status, stable for surgery.  I have reviewed the patient's chart and labs.  Questions were answered to the patient's satisfaction.     Johnathan Arnold

## 2019-04-07 ENCOUNTER — Other Ambulatory Visit: Payer: Self-pay

## 2019-04-07 MED ORDER — CARBIDOPA-LEVODOPA ER 50-200 MG PO TBCR: 1.0000 | EXTENDED_RELEASE_TABLET | Freq: Every day | ORAL | 0 refills | Status: DC

## 2019-04-07 MED ORDER — CARBIDOPA-LEVODOPA ER 50-200 MG PO TBCR: 1.0000 | EXTENDED_RELEASE_TABLET | Freq: Every day | ORAL | 5 refills | Status: DC

## 2019-04-15 DIAGNOSIS — M62830 Muscle spasm of back: Secondary | ICD-10-CM | POA: Diagnosis not present

## 2019-04-15 DIAGNOSIS — M9901 Segmental and somatic dysfunction of cervical region: Secondary | ICD-10-CM | POA: Diagnosis not present

## 2019-04-15 DIAGNOSIS — M5134 Other intervertebral disc degeneration, thoracic region: Secondary | ICD-10-CM | POA: Diagnosis not present

## 2019-04-15 DIAGNOSIS — M9902 Segmental and somatic dysfunction of thoracic region: Secondary | ICD-10-CM | POA: Diagnosis not present

## 2019-04-21 ENCOUNTER — Ambulatory Visit (INDEPENDENT_AMBULATORY_CARE_PROVIDER_SITE_OTHER): Payer: Medicare Other | Admitting: Cardiovascular Disease

## 2019-04-21 ENCOUNTER — Other Ambulatory Visit: Payer: Self-pay

## 2019-04-21 ENCOUNTER — Encounter: Payer: Self-pay | Admitting: Cardiovascular Disease

## 2019-04-21 VITALS — BP 124/54 | HR 56 | Ht 66.0 in | Wt 194.0 lb

## 2019-04-21 DIAGNOSIS — I2510 Atherosclerotic heart disease of native coronary artery without angina pectoris: Secondary | ICD-10-CM

## 2019-04-21 DIAGNOSIS — I10 Essential (primary) hypertension: Secondary | ICD-10-CM

## 2019-04-21 DIAGNOSIS — E7800 Pure hypercholesterolemia, unspecified: Secondary | ICD-10-CM | POA: Diagnosis not present

## 2019-04-21 DIAGNOSIS — I779 Disorder of arteries and arterioles, unspecified: Secondary | ICD-10-CM | POA: Diagnosis not present

## 2019-04-21 DIAGNOSIS — I25118 Atherosclerotic heart disease of native coronary artery with other forms of angina pectoris: Secondary | ICD-10-CM

## 2019-04-21 MED ORDER — ROSUVASTATIN CALCIUM 10 MG PO TABS: 10.0000 mg | ORAL_TABLET | Freq: Every day | ORAL | 3 refills | Status: DC

## 2019-04-21 NOTE — Progress Notes (Signed)
Cardiology Office Note   Date:  04/21/2019   ID:  Johnathan Stanke., DOB 1946/02/28, MRN [id number]  PCP:  [name], MD  Cardiologist:   [name], MD   Chief Complaint  Patient presents with  . other    4 week follow up s/p cardiac cath. Meds reviewed by the pt. verbally. "doing well."       History of Present Illness: Johnathan Marks. is a 73 y.o. male who presents for a followup visit.  He has known history of coronary artery disease status post CABG in 1999. He also has known history of hypertension, chronic kidney disease, hyperlipidemia, Parkinson's disease, sleep apnea on CPAP and type 2 diabetes.  Previous carotid Doppler showed mild nonobstructive bilateral disease.  He had worsening exertional dyspnea and chest pain last year.  He underwent a Lexiscan Myoview which showed mild anteroseptal ischemia with normal ejection fraction. A right and left cardiac catheterization was done in August 2019 which showed occluded native arteries with patent grafts including LIMA to LAD, SVG to diagonal, SVG to OM and SVG to distal RCA.  Right heart catheterization showed normal filling pressures, mild pulmonary hypertension and normal cardiac output. He has chronic bilateral exertional leg pain with normal lower extremity arterial Doppler in 2018.  Also he has palpable pulses by exam. He again had worsening exertional dyspnea recently and thus I repeated his echocardiogram in September which showed normal ejection fraction, grade 2 diastolic dysfunction, moderately dilated right ventricle with severe pulmonary hypertension.  Estimated pulmonary systolic pressure was 61 mmHg.  IVC was dilated with less than 50% respiratory variability.  Based on this, I added furosemide 40 mg once daily.  I scheduled him for VQ scan which showed no evidence of acute or chronic pulmonary embolism.  He underwent right heart catheterization last month which showed moderate pulmonary hypertension  at 54 over 13 mmHg with wedge pressure of 17 mmHg.  Pulmonary vascular resistance was 2.22 [place] units.  Pulmonary hypertension was felt to be of a mixed etiology.  He reports stable exertional dyspnea.  He uses his CPAP on a regular basis.  He continues to complain of muscle fatigue in his legs mostly in the front.   Past Medical History:  Diagnosis Date  . 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 Diabetes mellitus without complication ([place])   . History of cardiac catheterization 2011   [facility]  . Hyperlipidemia   . Hypertension   . Hypertriglyceridemia   . Parkinson's disease ([address])   . 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 Vertigo     Past Surgical History:  Procedure Laterality Date  . CARDIAC CATHETERIZATION  05-19-2010   [facility]: Patent grafts. LIMA to LAD, SVG to D1, OM1 and RPDA  . CORONARY ARTERY BYPASS GRAFT  03/1998   5 vessel, [facility]  . RIGHT HEART CATH N/A 04/04/2019   Procedure: RIGHT HEART CATH;  Surgeon: Wellington Hampshire, MD;  Location: [facility];  Service: Cardiovascular;  Laterality: N/A;  . RIGHT/LEFT HEART CATH AND CORONARY ANGIOGRAPHY N/A 02/01/2018   Procedure: RIGHT/LEFT HEART CATH AND CORONARY ANGIOGRAPHY;  Surgeon: Wellington Hampshire, MD;  Location: [facility];  Service: Cardiovascular;  Laterality: N/A;     Current Outpatient Medications  Medication Sig Dispense Refill  . aspirin EC 81 MG tablet Take 162 mg by mouth at bedtime.    . carbidopa-levodopa (SINEMET CR) 50-200 MG  tablet Take 1 tablet by mouth at bedtime. 90 tablet 0  . carbidopa-levodopa (SINEMET IR) 25-100 MG tablet TAKE ONE TABLET BY MOUTH THREE TIMES A DAY 270 tablet 1  . carvedilol (COREG) 6.25 MG tablet TAKE ONE TABLET BY MOUTH TWICE A DAY 180 tablet 0  . clonazePAM (KLONOPIN) 0.5 MG tablet 1.5 tablets at bedtime (Patient taking differently: Take 0.75 mg by mouth at bedtime. ) 45 tablet 5  . diazepam (VALIUM) 5 MG tablet Take 1 tablet (5 mg total) by mouth every 8  (eight) hours as needed for anxiety (or vertigo). 60 tablet 1  . furosemide (LASIX) 40 MG tablet Take 1 tablet (40 mg total) by mouth daily. 30 tablet 5  . isosorbide mononitrate (IMDUR) 60 MG 24 hr tablet TAKE ONE TABLET BY MOUTH DAILY (Patient taking differently: Take 60 mg by mouth daily. ) 90 tablet 0  . losartan (COZAAR) 25 MG tablet TAKE ONE TABLET BY MOUTH DAILY (Patient taking differently: Take 25 mg by mouth daily. ) 90 tablet 1  . Multiple Vitamin (MULTIVITAMIN) tablet Take 1 tablet by mouth daily.      . nitroGLYCERIN (NITROSTAT) 0.4 MG SL tablet Place 1 tablet (0.4 mg total) under the tongue every 5 (five) minutes as needed for chest pain. 25 tablet 1  . Omega-3 Fatty Acids (FISH OIL) 1200 MG CAPS Take 1,200 mg by mouth daily.     Marland Kitchen omeprazole (PRILOSEC) 20 MG capsule TAKE 1 CAPSULE BY MOUTH EVERY MORNING AS DIRECTED (Patient taking differently: Take 20 mg by mouth every other day. In the morning.) 90 capsule 1  . polyethylene glycol (MIRALAX / GLYCOLAX) packet Take 17 g by mouth daily.     . ranolazine (RANEXA) 1000 MG SR tablet TAKE 1 TABLET(1000 MG) BY MOUTH TWICE DAILY (Patient taking differently: Take 1,000 mg by mouth 2 (two) times daily. ) 180 tablet 3  . simvastatin (ZOCOR) 20 MG tablet Take 1 tablet (20 mg total) by mouth at bedtime. 90 tablet 3  . tamsulosin (FLOMAX) 0.4 MG CAPS capsule Take 1 capsule (0.4 mg total) by mouth daily. (Patient taking differently: Take 0.4 mg by mouth at bedtime. ) 90 capsule 3   No current facility-administered medications for this visit.     Allergies:   Patient has no known allergies.    Social History:  The patient  reports that he has never smoked. He has never used smokeless tobacco. He reports current alcohol use. He reports that he does not use drugs.   Family History:  The patient's family history includes Healthy in his son; Heart attack (age of onset: 3) in his father; Heart attack (age of onset: 61) in his mother; Heart disease in  his brother and father; Hypertension in his mother.    ROS:  Please see the history of present illness.   Otherwise, review of systems are positive for none.   All other systems are reviewed and negative.    PHYSICAL EXAM: VS:  BP (!) 124/54 (BP Location: Left Arm, Patient Position: Sitting, Cuff Size: Normal)   Pulse (!) 56   Ht 5\' 6"  (1.676 m)   Wt 194 lb (88 kg)   SpO2 98%   BMI 31.31 kg/m  , BMI Body mass index is 31.31 kg/m. GEN: Well nourished, well developed, in no acute distress  HEENT: normal  Neck: no JVD, carotid bruits, or masses Cardiac: RRR; no murmurs, rubs, or gallops,no edema  Respiratory:  clear to auscultation bilaterally, normal work of breathing  GI: soft, nontender, nondistended, + BS MS: no deformity or atrophy  Skin: warm and dry, no rash Neuro:  Strength and sensation are intact Psych: euthymic mood, full affect Femoral pulses normal bilaterally and distal pulses are palpable  EKG:  EKG is ordered today. The ekg ordered today demonstrates sinus bradycardia with nonspecific ST changes.   Recent Labs: 12/20/2018: ALT 11 03/31/2019: BUN 19; Creatinine, Ser 1.76; Hemoglobin 11.3; Platelets 137; Potassium 4.8; Sodium 140    Lipid Panel    Component Value Date/Time   CHOL 116 05/17/2018 1112   TRIG 102.0 05/17/2018 1112   HDL 53.20 05/17/2018 1112   CHOLHDL 2 05/17/2018 1112   VLDL 20.4 05/17/2018 1112   LDLCALC 42 05/17/2018 1112   LDLDIRECT 38.0 08/06/2015 1118      Wt Readings from Last 3 Encounters:  04/21/19 194 lb (88 kg)  04/04/19 185 lb (83.9 kg)  03/22/19 188 lb (85.3 kg)        ASSESSMENT AND PLAN:  1.  Coronary artery disease involving native coronary arteries with stable angina: Most recent cardiac catheterization in 2019 showed patent grafts.  He likely has some ischemia in the proximal LAD distribution that is not bypassed.  Continue Ranexa and Imdur.  2.  Moderate pulmonary hypertension: Recent right heart catheterization  showed that his pulmonary hypertension was moderate with mildly elevated filling pressures.  Pulmonary hypertension is likely of a mixed etiology with a component of chronic diastolic heart failure.  He uses his CPAP on a regular basis.  Appears to be euvolemic on current dose of furosemide.  VQ scan showed no evidence of pulmonary embolism.  Pulmonary vasodilators can be considered in the future if his symptoms worsen.   3. Bilateral carotid artery disease:  Mild nonobstructive disease. Continue treatment of risk factors.  4. Hyperlipidemia: Currently on simvastatin.  I wonder if his leg fatigue might be related to simvastatin.  I am going to change him to small dose rosuvastatin to see if that makes a difference.  5. Essential hypertension: Blood pressure is controlled.  6. Exertional leg pain: He still has good bilateral pulses and thus significant PAD is doubtful.     Disposition:   FU with me in 4 months  Signed,  [name], MD  04/21/2019 2:25 PM    Johnathan Arnold

## 2019-04-21 NOTE — Patient Instructions (Signed)
Medication Instructions:  Your physician has recommended you make the following change in your medication:   1) STOP Simvatstatin  2) START Rosuvastatin (Crestor) 10mg  daily. An Rx has been sent to your pharmacy.  *If you need a refill on your cardiac medications before your next appointment, please call your pharmacy*  Lab Work: None ordered If you have labs (blood work) drawn today and your tests are completely normal, you will receive your results only by: Marland Kitchen MyChart Message (if you have MyChart) OR . A paper copy in the mail If you have any lab test that is abnormal or we need to change your treatment, we will call you to review the results.  Testing/Procedures: None ordered  Follow-Up: At [facility], you and your health needs are our priority.  As part of our continuing mission to provide you with exceptional heart care, we have created designated Provider Care Teams.  These Care Teams include your primary Cardiologist (physician) and Advanced Practice Providers (APPs -  Physician Assistants and Nurse Practitioners) who all work together to provide you with the care you need, when you need it.  Your next appointment:   4 months  The format for your next appointment:   In Person  Provider:    You may see  [name]  or one of the following Advanced Practice Providers on your designated Care Team:    Murray Hodgkins, NP  Christell Faith, PA-C  Marrianne Mood, PA-C   Other Instructions N/A

## 2019-05-04 ENCOUNTER — Telehealth: Payer: Self-pay | Admitting: *Deleted

## 2019-05-04 NOTE — Telephone Encounter (Signed)
Spoke with pt to let him know that both him and his wife should be tested on Friday and that they should stay quarantined until they get the results back. Pt gave a verbal understanding.

## 2019-05-04 NOTE — Telephone Encounter (Signed)
Copied from [place] [id number]. Topic: General - Other >> May 04, 2019 10:44 AM Leward Quan A wrote: Reason for CRM: Patient called to say that he recently became aware that he and his wife were exposed to someone that is positive for covid. He is on the way back from [place] and will not make it in time to be tested today. Patient or his wife are not exhibiting any symptoms but is asking for a call back with some instructions on how to go about this. Please call Ph#  [phone]

## 2019-05-04 NOTE — Telephone Encounter (Signed)
Spoke with pt and he stated that he was eating breakfast on 04/29/2019 with the gentleman that tested positive for Covid-19 on 05/02/2019. The pt stated that they were at the same table but about 6 to 8 feet apart, wore their masks except for when eating. Pt stated that his wife was not at the breakfast but has been around her husband who was exposed. Neither one the pt or his wife are having any symptoms at all. They are wondering if they need to be tested.

## 2019-05-04 NOTE — Telephone Encounter (Signed)
Yes they should be be tested on Friday and should isolate until they know the results

## 2019-05-09 ENCOUNTER — Other Ambulatory Visit: Payer: Self-pay

## 2019-05-09 DIAGNOSIS — Z20822 Contact with and (suspected) exposure to covid-19: Secondary | ICD-10-CM

## 2019-05-09 DIAGNOSIS — Z20828 Contact with and (suspected) exposure to other viral communicable diseases: Secondary | ICD-10-CM | POA: Diagnosis not present

## 2019-05-11 LAB — NOVEL CORONAVIRUS, NAA: SARS-CoV-2, NAA: NOT DETECTED

## 2019-05-13 DIAGNOSIS — M9901 Segmental and somatic dysfunction of cervical region: Secondary | ICD-10-CM | POA: Diagnosis not present

## 2019-05-13 DIAGNOSIS — M62830 Muscle spasm of back: Secondary | ICD-10-CM | POA: Diagnosis not present

## 2019-05-13 DIAGNOSIS — M9902 Segmental and somatic dysfunction of thoracic region: Secondary | ICD-10-CM | POA: Diagnosis not present

## 2019-05-13 DIAGNOSIS — M5134 Other intervertebral disc degeneration, thoracic region: Secondary | ICD-10-CM | POA: Diagnosis not present

## 2019-05-20 ENCOUNTER — Encounter: Payer: Medicare Other | Admitting: Internal Medicine

## 2019-05-24 ENCOUNTER — Ambulatory Visit: Payer: Medicare Other

## 2019-05-26 ENCOUNTER — Other Ambulatory Visit: Payer: Self-pay

## 2019-05-26 ENCOUNTER — Ambulatory Visit: Payer: Medicare Other | Attending: Internal Medicine

## 2019-05-26 ENCOUNTER — Ambulatory Visit: Payer: Medicare Other | Admitting: Pulmonary Disease

## 2019-05-26 DIAGNOSIS — Z20822 Contact with and (suspected) exposure to covid-19: Secondary | ICD-10-CM

## 2019-05-27 LAB — NOVEL CORONAVIRUS, NAA: SARS-CoV-2, NAA: NOT DETECTED

## 2019-05-31 ENCOUNTER — Other Ambulatory Visit: Payer: Self-pay

## 2019-05-31 ENCOUNTER — Ambulatory Visit
Admission: RE | Admit: 2019-05-31 | Discharge: 2019-05-31 | Disposition: A | Discharge status: Home / Self Care | Payer: Medicare Other | Source: Ambulatory Visit | Attending: Pulmonary Disease | Admitting: Pulmonary Disease

## 2019-05-31 DIAGNOSIS — R918 Other nonspecific abnormal finding of lung field: Secondary | ICD-10-CM | POA: Diagnosis present

## 2019-06-06 ENCOUNTER — Telehealth: Payer: Self-pay | Admitting: Pulmonary Disease

## 2019-06-06 ENCOUNTER — Encounter: Payer: Self-pay | Admitting: Neurology

## 2019-06-06 NOTE — Telephone Encounter (Signed)
Pt can come in for OV, as long as he is not having any symptoms.

## 2019-06-07 ENCOUNTER — Ambulatory Visit: Payer: Medicare Other | Admitting: Pulmonary Disease

## 2019-06-07 ENCOUNTER — Encounter: Payer: Self-pay | Admitting: Pulmonary Disease

## 2019-06-07 VITALS — BP 144/72 | HR 54 | Temp 97.1°F | Ht 67.0 in | Wt 192.8 lb

## 2019-06-07 DIAGNOSIS — G4761 Periodic limb movement disorder: Secondary | ICD-10-CM

## 2019-06-07 DIAGNOSIS — G4730 Sleep apnea, unspecified: Secondary | ICD-10-CM

## 2019-06-07 DIAGNOSIS — R911 Solitary pulmonary nodule: Secondary | ICD-10-CM

## 2019-06-07 DIAGNOSIS — G20 Parkinson's disease: Secondary | ICD-10-CM

## 2019-06-07 NOTE — Progress Notes (Signed)
 Assessment & Plan:  1. Sleep apnea in adult (Primary)  2. Periodic limb movement disorder  3. Parkinson's disease (HCC)  4. Solitary pulmonary nodule   Patient Instructions  We will see you in follow-up in 4 months time.  Continue the good job you are doing with wearing your CPAP.  Please note: late entry documentation due to logistical difficulties during COVID-19 pandemic. This note is filed for information purposes only, and is not intended to be used for billing, nor does it represent the full scope/nature of the visit in question. Please see any associated scanned media linked to date of encounter for additional pertinent information.  Subjective:    HPI: Johnathan Arnold. is a 73 y.o. male presenting to the pulmonology clinic on 06/07/2019 with report of: Follow-up (Patient denies any issues with breathing, coughing, or wheezing. States he is compliant with CPAP)     Outpatient Encounter Medications as of 06/07/2019  Medication Sig Note   Multiple Vitamin (MULTIVITAMIN) tablet Take 1 tablet by mouth daily.    polyethylene glycol (MIRALAX  / GLYCOLAX ) packet Take 17 g by mouth daily as needed. 07/09/2023: prn   [DISCONTINUED] aspirin  EC 81 MG tablet Take 162 mg by mouth at bedtime.    [DISCONTINUED] carbidopa -levodopa  (SINEMET  CR) 50-200 MG tablet Take 1 tablet by mouth at bedtime.    [DISCONTINUED] carbidopa -levodopa  (SINEMET  IR) 25-100 MG tablet TAKE ONE TABLET BY MOUTH THREE TIMES A DAY    [DISCONTINUED] carvedilol  (COREG ) 6.25 MG tablet TAKE ONE TABLET BY MOUTH TWICE A DAY    [DISCONTINUED] clonazePAM  (KLONOPIN ) 0.5 MG tablet 1.5 tablets at bedtime (Patient taking differently: Take 0.75 mg by mouth at bedtime. )    [DISCONTINUED] diazepam  (VALIUM ) 5 MG tablet Take 1 tablet (5 mg total) by mouth every 8 (eight) hours as needed for anxiety (or vertigo).    [DISCONTINUED] isosorbide  mononitrate (IMDUR ) 60 MG 24 hr tablet TAKE ONE TABLET BY MOUTH DAILY (Patient taking  differently: Take 60 mg by mouth daily. )    [DISCONTINUED] losartan  (COZAAR ) 25 MG tablet TAKE ONE TABLET BY MOUTH DAILY (Patient taking differently: Take 25 mg by mouth daily. )    [DISCONTINUED] nitroGLYCERIN  (NITROSTAT ) 0.4 MG SL tablet Place 1 tablet (0.4 mg total) under the tongue every 5 (five) minutes as needed for chest pain.    [DISCONTINUED] Omega-3 Fatty Acids (FISH OIL) 1200 MG CAPS Take 1,200 mg by mouth daily.  (Patient not taking: Reported on 12/14/2020)    [DISCONTINUED] omeprazole  (PRILOSEC) 20 MG capsule TAKE 1 CAPSULE BY MOUTH EVERY MORNING AS DIRECTED (Patient taking differently: Take 20 mg by mouth every other day. In the morning.)    [DISCONTINUED] ranolazine  (RANEXA ) 1000 MG SR tablet TAKE 1 TABLET(1000 MG) BY MOUTH TWICE DAILY (Patient taking differently: Take 1,000 mg by mouth 2 (two) times daily. )    [DISCONTINUED] rosuvastatin  (CRESTOR ) 10 MG tablet Take 1 tablet (10 mg total) by mouth daily.    [DISCONTINUED] tamsulosin  (FLOMAX ) 0.4 MG CAPS capsule Take 1 capsule (0.4 mg total) by mouth daily. (Patient taking differently: Take 0.4 mg by mouth at bedtime. )    [DISCONTINUED] furosemide  (LASIX ) 40 MG tablet Take 1 tablet (40 mg total) by mouth daily.    No facility-administered encounter medications on file as of 06/07/2019.      Objective:   Vitals:   06/07/19 0940  BP: (!) 144/72  Pulse: (!) 54  Temp: (!) 97.1 F (36.2 C)  Height: 5' 7 (1.702 m)  Weight: 192 lb 12.8 oz (87.5 kg)  SpO2: 100% Comment: On RA  TempSrc: Temporal  BMI (Calculated): 30.19     Physical exam documentation is limited by delayed entry of information.

## 2019-06-07 NOTE — Patient Instructions (Signed)
We will see you in follow-up in 4 months time.  Continue the good job you are doing with wearing your CPAP.

## 2019-06-14 ENCOUNTER — Other Ambulatory Visit: Payer: Self-pay | Admitting: Cardiovascular Disease

## 2019-06-22 ENCOUNTER — Other Ambulatory Visit: Payer: Self-pay | Admitting: Cardiovascular Disease

## 2019-06-24 DIAGNOSIS — M5134 Other intervertebral disc degeneration, thoracic region: Secondary | ICD-10-CM | POA: Diagnosis not present

## 2019-06-24 DIAGNOSIS — M9901 Segmental and somatic dysfunction of cervical region: Secondary | ICD-10-CM | POA: Diagnosis not present

## 2019-06-24 DIAGNOSIS — M9902 Segmental and somatic dysfunction of thoracic region: Secondary | ICD-10-CM | POA: Diagnosis not present

## 2019-06-24 DIAGNOSIS — M62830 Muscle spasm of back: Secondary | ICD-10-CM | POA: Diagnosis not present

## 2019-06-27 DIAGNOSIS — H6122 Impacted cerumen, left ear: Secondary | ICD-10-CM | POA: Diagnosis not present

## 2019-06-27 DIAGNOSIS — R1313 Dysphagia, pharyngeal phase: Secondary | ICD-10-CM | POA: Diagnosis not present

## 2019-06-27 DIAGNOSIS — K219 Gastro-esophageal reflux disease without esophagitis: Secondary | ICD-10-CM | POA: Diagnosis not present

## 2019-06-27 DIAGNOSIS — R490 Dysphonia: Secondary | ICD-10-CM | POA: Diagnosis not present

## 2019-06-27 DIAGNOSIS — H903 Sensorineural hearing loss, bilateral: Secondary | ICD-10-CM | POA: Diagnosis not present

## 2019-06-28 ENCOUNTER — Ambulatory Visit: Payer: Medicare Other | Admitting: Internal Medicine

## 2019-07-03 ENCOUNTER — Other Ambulatory Visit: Payer: Self-pay | Admitting: Cardiovascular Disease

## 2019-07-04 ENCOUNTER — Encounter: Payer: Self-pay | Admitting: Neurology

## 2019-07-04 NOTE — Progress Notes (Signed)
Virtual Visit via Video Note The purpose of this virtual visit is to provide medical care while limiting exposure to the novel coronavirus.    Consent was obtained for video visit:  Yes.   Answered questions that patient had about telehealth interaction:  Yes.   I discussed the limitations, risks, security and privacy concerns of performing an evaluation and management service by telemedicine. I also discussed with the patient that there may be a patient responsible charge related to this service. The patient expressed understanding and agreed to proceed.  Pt location: Home Physician Location: office Name of referring provider:  [name], MD I connected with Johnathan Arnold. at patients initiation/request on 07/05/2019 at  1:30 PM EST by video enabled telemedicine application and verified that I am speaking with the correct person using two identifiers. Pt MRN:  [id number] Pt DOB:  May 21, 1946 Video Participants:  Johnathan Arnold.;  Wife supplements the history   History of Present Illness:  Patient seen today in follow-up for Parkinson's disease.  Feels that levodopa may need to be increased.  Feeling more jittery.  My previous records as well as any outside records made available were reviewed prior to todays visit.  Pt denies falls.  Pt denies lightheadedness, near syncope.  No hallucinations.  Mood has been fair - pt states that it is moody.  Wife states that mood isn't good.  Pt doesn't want further medication for it.  Patient is in the hospital since last visit for heart catheterization.  This demonstrated moderate pulmonary hypertension.  Current movement d/o meds: Carbidopa/levodopa 25/100, 1 tablet 3 times per day Carbidopa/levodopa 50/200 CR at bedtime (added last visit for restless leg early in the morning) - hes not sure that this helped Clonazepam, 0.5 mg, 1.5 tablets nightly (on m/w/f he goes to the gym, he will only take 1 tablet so he can get up and go to the  gym)   Current Outpatient Medications on File Prior to Visit  Medication Sig Dispense Refill  . aspirin EC 81 MG tablet Take 162 mg by mouth at bedtime.    . carbidopa-levodopa (SINEMET CR) 50-200 MG tablet Take 1 tablet by mouth at bedtime. 90 tablet 0  . carbidopa-levodopa (SINEMET IR) 25-100 MG tablet TAKE ONE TABLET BY MOUTH THREE TIMES A DAY 270 tablet 1  . carvedilol (COREG) 6.25 MG tablet TAKE ONE TABLET BY MOUTH TWICE A DAY 180 tablet 0  . clonazePAM (KLONOPIN) 0.5 MG tablet 1.5 tablets at bedtime (Patient taking differently: Take 0.75 mg by mouth at bedtime. ) 45 tablet 5  . diazepam (VALIUM) 5 MG tablet Take 1 tablet (5 mg total) by mouth every 8 (eight) hours as needed for anxiety (or vertigo). 60 tablet 1  . isosorbide mononitrate (IMDUR) 60 MG 24 hr tablet TAKE ONE TABLET BY MOUTH DAILY 90 tablet 3  . losartan (COZAAR) 25 MG tablet TAKE ONE TABLET BY MOUTH DAILY (Patient taking differently: Take 25 mg by mouth daily. ) 90 tablet 1  . Multiple Vitamin (MULTIVITAMIN) tablet Take 1 tablet by mouth daily.      . nitroGLYCERIN (NITROSTAT) 0.4 MG SL tablet Place 1 tablet (0.4 mg total) under the tongue every 5 (five) minutes as needed for chest pain. 25 tablet 1  . Omega-3 Fatty Acids (FISH OIL) 1200 MG CAPS Take 1,200 mg by mouth daily.     Marland Kitchen omeprazole (PRILOSEC) 20 MG capsule TAKE 1 CAPSULE BY MOUTH EVERY MORNING AS DIRECTED (Patient taking  differently: Take 20 mg by mouth every other day. In the morning.) 90 capsule 1  . polyethylene glycol (MIRALAX / GLYCOLAX) packet Take 17 g by mouth daily.     . ranolazine (RANEXA) 1000 MG SR tablet Take 1 tablet (1,000 mg total) by mouth 2 (two) times daily. 60 tablet 3  . rosuvastatin (CRESTOR) 10 MG tablet Take 1 tablet (10 mg total) by mouth daily. 90 tablet 3  . tamsulosin (FLOMAX) 0.4 MG CAPS capsule Take 1 capsule (0.4 mg total) by mouth daily. (Patient taking differently: Take 0.4 mg by mouth at bedtime. ) 90 capsule 3  . furosemide (LASIX)  40 MG tablet Take 1 tablet (40 mg total) by mouth daily. 30 tablet 5   No current facility-administered medications on file prior to visit.     Observations/Objective:   There were no vitals filed for this visit. GEN:  The patient appears stated age and is in NAD.  Neurological examination:  Orientation: The patient is alert and oriented x3. Cranial nerves: There is good facial symmetry. There is no significant facial hypomimia.  The speech is fluent and clear. Soft palate rises symmetrically and there is no tongue deviation. Hearing is intact to conversational tone. Motor: Strength is at least antigravity x 4.   Shoulder shrug is equal and symmetric.  There is no pronator drift.  Movement examination: Tone: unable Abnormal movements: None Coordination:  There is no decremation with RAM's, with any form of RAMS, including alternating supination and pronation of the forearm, hand opening and closing, finger taps. Gait and Station: The patient has no difficulty arising out of a deep-seated chair without the use of the hands. The patient's stride length is good.      Assessment and Plan:   1.  Parkinsons Disease  -Increase carbidopa/levodopa 25/100, 1.5 tablet 3 times per day  -Continue carbidopa/levodopa 50/200 CR at bedtime  -Congratulated him on his exercise.  -Offered LSVT for the voice.  Declines for now. 2.  Hypertension  -Following with primary care 3.  Restless leg and RBD  -On clonazepam 0.5 mg, 1.5 tablets nightly 4.  Sleep apnea  -On CPAP 5.  Moderate pulmonary hypertension  -Following with cardiology 6.  Mood changes  -Declines medication.  Will let me know if he changes his mind.  Follow Up Instructions:    -I discussed the assessment and treatment plan with the patient. The patient was provided an opportunity to ask questions and all were answered. The patient agreed with the plan and demonstrated an understanding of the instructions.   The patient was advised  to call back or seek an in-person evaluation if the symptoms worsen or if the condition fails to improve as anticipated.    Total time spent on today's visit was 30 minutes, including both face-to-face time and nonface-to-face time.  Time included that spent on review of records (prior notes available to me/labs/imaging if pertinent), discussing treatment and goals, answering patient's questions and coordinating care.   Alonza Bogus, DO

## 2019-07-05 ENCOUNTER — Other Ambulatory Visit: Payer: Self-pay

## 2019-07-05 ENCOUNTER — Telehealth (INDEPENDENT_AMBULATORY_CARE_PROVIDER_SITE_OTHER): Payer: Medicare Other | Admitting: Neurology

## 2019-07-05 DIAGNOSIS — G2581 Restless legs syndrome: Secondary | ICD-10-CM | POA: Diagnosis not present

## 2019-07-05 DIAGNOSIS — G20 Parkinson's disease: Secondary | ICD-10-CM

## 2019-07-05 DIAGNOSIS — I10 Essential (primary) hypertension: Secondary | ICD-10-CM | POA: Diagnosis not present

## 2019-07-05 DIAGNOSIS — G20A1 Parkinson's disease without dyskinesia, without mention of fluctuations: Secondary | ICD-10-CM

## 2019-07-05 MED ORDER — CARBIDOPA-LEVODOPA 25-100 MG PO TABS: 1.5000 | ORAL_TABLET | Freq: Three times a day (TID) | ORAL | 1 refills | Status: DC

## 2019-07-10 ENCOUNTER — Other Ambulatory Visit: Payer: Self-pay | Admitting: Neurology

## 2019-07-10 ENCOUNTER — Other Ambulatory Visit: Payer: Self-pay | Admitting: Internal Medicine

## 2019-07-29 DIAGNOSIS — M62830 Muscle spasm of back: Secondary | ICD-10-CM | POA: Diagnosis not present

## 2019-07-29 DIAGNOSIS — M5134 Other intervertebral disc degeneration, thoracic region: Secondary | ICD-10-CM | POA: Diagnosis not present

## 2019-07-29 DIAGNOSIS — M9902 Segmental and somatic dysfunction of thoracic region: Secondary | ICD-10-CM | POA: Diagnosis not present

## 2019-07-29 DIAGNOSIS — M9901 Segmental and somatic dysfunction of cervical region: Secondary | ICD-10-CM | POA: Diagnosis not present

## 2019-08-07 ENCOUNTER — Other Ambulatory Visit: Payer: Self-pay | Admitting: Neurology

## 2019-08-15 ENCOUNTER — Other Ambulatory Visit: Payer: Self-pay

## 2019-08-17 ENCOUNTER — Ambulatory Visit (INDEPENDENT_AMBULATORY_CARE_PROVIDER_SITE_OTHER): Payer: Medicare Other | Admitting: Internal Medicine

## 2019-08-17 ENCOUNTER — Ambulatory Visit (INDEPENDENT_AMBULATORY_CARE_PROVIDER_SITE_OTHER): Payer: Medicare Other

## 2019-08-17 ENCOUNTER — Ambulatory Visit: Payer: Medicare Other

## 2019-08-17 ENCOUNTER — Encounter: Payer: Self-pay | Admitting: Internal Medicine

## 2019-08-17 ENCOUNTER — Other Ambulatory Visit: Payer: Self-pay

## 2019-08-17 VITALS — BP 128/64 | HR 46 | Temp 97.4°F | Resp 14 | Ht 67.0 in | Wt 191.8 lb

## 2019-08-17 VITALS — BP 128/64 | Ht 67.0 in | Wt 191.0 lb

## 2019-08-17 DIAGNOSIS — E1159 Type 2 diabetes mellitus with other circulatory complications: Secondary | ICD-10-CM | POA: Diagnosis not present

## 2019-08-17 DIAGNOSIS — Z0000 Encounter for general adult medical examination without abnormal findings: Secondary | ICD-10-CM | POA: Diagnosis not present

## 2019-08-17 DIAGNOSIS — Z1211 Encounter for screening for malignant neoplasm of colon: Secondary | ICD-10-CM | POA: Diagnosis not present

## 2019-08-17 DIAGNOSIS — M79605 Pain in left leg: Secondary | ICD-10-CM

## 2019-08-17 DIAGNOSIS — R29898 Other symptoms and signs involving the musculoskeletal system: Secondary | ICD-10-CM | POA: Diagnosis not present

## 2019-08-17 DIAGNOSIS — E1121 Type 2 diabetes mellitus with diabetic nephropathy: Secondary | ICD-10-CM

## 2019-08-17 DIAGNOSIS — F5101 Primary insomnia: Secondary | ICD-10-CM | POA: Diagnosis not present

## 2019-08-17 DIAGNOSIS — I272 Pulmonary hypertension, unspecified: Secondary | ICD-10-CM

## 2019-08-17 DIAGNOSIS — M545 Low back pain, unspecified: Secondary | ICD-10-CM

## 2019-08-17 DIAGNOSIS — I209 Angina pectoris, unspecified: Secondary | ICD-10-CM

## 2019-08-17 DIAGNOSIS — R5383 Other fatigue: Secondary | ICD-10-CM | POA: Diagnosis not present

## 2019-08-17 DIAGNOSIS — G20 Parkinson's disease: Secondary | ICD-10-CM | POA: Diagnosis not present

## 2019-08-17 DIAGNOSIS — Z125 Encounter for screening for malignant neoplasm of prostate: Secondary | ICD-10-CM

## 2019-08-17 DIAGNOSIS — N1831 Chronic kidney disease, stage 3a: Secondary | ICD-10-CM

## 2019-08-17 DIAGNOSIS — M79604 Pain in right leg: Secondary | ICD-10-CM

## 2019-08-17 DIAGNOSIS — I10 Essential (primary) hypertension: Secondary | ICD-10-CM

## 2019-08-17 DIAGNOSIS — E7800 Pure hypercholesterolemia, unspecified: Secondary | ICD-10-CM

## 2019-08-17 LAB — LIPID PANEL
Cholesterol: 113 mg/dL (ref 0–200)
HDL: 60.5 mg/dL (ref >39.00)
LDL Cholesterol: 32 mg/dL (ref 0–99)
NonHDL: 52.7
Total CHOL/HDL Ratio: 2
Triglycerides: 105 mg/dL (ref 0.0–149.0)
VLDL: 21 mg/dL (ref 0.0–40.0)

## 2019-08-17 LAB — MICROALBUMIN / CREATININE URINE RATIO
Creatinine,U: 32.3 mg/dL
Microalb Creat Ratio: 2.2 mg/g (ref 0.0–30.0)
Microalb, Ur: <0.7 mg/dL (ref 0.0–1.9)

## 2019-08-17 LAB — COMPREHENSIVE METABOLIC PANEL
ALT: 16 U/L (ref 0–53)
AST: 18 U/L (ref 0–37)
Albumin: 4.4 g/dL (ref 3.5–5.2)
Alkaline Phosphatase: 48 U/L (ref 39–117)
BUN: 27 mg/dL — ABNORMAL HIGH (ref 6–23)
CO2: 32 mEq/L (ref 19–32)
Calcium: 9.5 mg/dL (ref 8.4–10.5)
Chloride: 101 mEq/L (ref 96–112)
Creatinine, Ser: 2 mg/dL — ABNORMAL HIGH (ref 0.40–1.50)
GFR: 32.77 mL/min — ABNORMAL LOW (ref >60.00)
Glucose, Bld: 103 mg/dL — ABNORMAL HIGH (ref 70–99)
Potassium: 4.8 mEq/L (ref 3.5–5.1)
Sodium: 138 mEq/L (ref 135–145)
Total Bilirubin: 0.7 mg/dL (ref 0.2–1.2)
Total Protein: 6.7 g/dL (ref 6.0–8.3)

## 2019-08-17 LAB — VITAMIN D 25 HYDROXY (VIT D DEFICIENCY, FRACTURES): VITD: 45.93 ng/mL (ref 30.00–100.00)

## 2019-08-17 LAB — PSA, MEDICARE: PSA: 1.06 ng/ml (ref 0.10–4.00)

## 2019-08-17 LAB — TSH: TSH: 2.7 u[IU]/mL (ref 0.35–4.50)

## 2019-08-17 LAB — HEMOGLOBIN A1C: Hgb A1c MFr Bld: 5.8 % (ref 4.6–6.5)

## 2019-08-17 NOTE — Patient Instructions (Signed)
I will initiate the order for your home colonn cancer screening  Test.  It is called  Cologuard.  It will be delivered to your house, and you will send off a stool sample in the envelope it provides.     Health Maintenance After Age 75 After age 64, you are at a higher risk for certain long-term diseases and infections as well as injuries from falls. Falls are a major cause of broken bones and head injuries in people who are older than age 73. Getting regular preventive care can help to keep you healthy and well. Preventive care includes getting regular testing and making lifestyle changes as recommended by your health care provider. Talk with your health care provider about:  Which screenings and tests you should have. A screening is a test that checks for a disease when you have no symptoms.  A diet and exercise plan that is right for you. What should I know about screenings and tests to prevent falls? Screening and testing are the best ways to find a health problem early. Early diagnosis and treatment give you the best chance of managing medical conditions that are common after age 75. Certain conditions and lifestyle choices may make you more likely to have a fall. Your health care provider may recommend:  Regular vision checks. Poor vision and conditions such as cataracts can make you more likely to have a fall. If you wear glasses, make sure to get your prescription updated if your vision changes.  Medicine review. Work with your health care provider to regularly review all of the medicines you are taking, including over-the-counter medicines. Ask your health care provider about any side effects that may make you more likely to have a fall. Tell your health care provider if any medicines that you take make you feel dizzy or sleepy.  Osteoporosis screening. Osteoporosis is a condition that causes the bones to get weaker. This can make the bones weak and cause them to break more easily.   Blood pressure screening. Blood pressure changes and medicines to control blood pressure can make you feel dizzy.  Strength and balance checks. Your health care provider may recommend certain tests to check your strength and balance while standing, walking, or changing positions.  Foot health exam. Foot pain and numbness, as well as not wearing proper footwear, can make you more likely to have a fall.  Depression screening. You may be more likely to have a fall if you have a fear of falling, feel emotionally low, or feel unable to do activities that you used to do.  Alcohol use screening. Using too much alcohol can affect your balance and may make you more likely to have a fall. What actions can I take to lower my risk of falls? General instructions  Talk with your health care provider about your risks for falling. Tell your health care provider if: ? You fall. Be sure to tell your health care provider about all falls, even ones that seem minor. ? You feel dizzy, sleepy, or off-balance.  Take over-the-counter and prescription medicines only as told by your health care provider. These include any supplements.  Eat a healthy diet and maintain a healthy weight. A healthy diet includes low-fat dairy products, low-fat (lean) meats, and fiber from whole grains, beans, and lots of fruits and vegetables. Home safety  Remove any tripping hazards, such as rugs, cords, and clutter.  Install safety equipment such as grab bars in bathrooms and safety rails on stairs.  Keep rooms and walkways well-lit. Activity   Follow a regular exercise program to stay fit. This will help you maintain your balance. Ask your health care provider what types of exercise are appropriate for you.  If you need a cane or walker, use it as recommended by your health care provider.  Wear supportive shoes that have nonskid soles. Lifestyle  Do not drink alcohol if your health care provider tells you not to drink.  If  you drink alcohol, limit how much you have: ? 0-1 drink a day for women. ? 0-2 drinks a day for men.  Be aware of how much alcohol is in your drink. In the [place] one drink equals one typical bottle of beer (12 oz), one-half glass of wine (5 oz), or one shot of hard liquor (1 oz).  Do not use any products that contain nicotine or tobacco, such as cigarettes and e-cigarettes. If you need help quitting, ask your health care provider. Summary  Having a healthy lifestyle and getting preventive care can help to protect your health and wellness after age 27.  Screening and testing are the best way to find a health problem early and help you avoid having a fall. Early diagnosis and treatment give you the best chance for managing medical conditions that are more common for people who are older than age 35.  Falls are a major cause of broken bones and head injuries in people who are older than age 41. Take precautions to prevent a fall at home.  Work with your health care provider to learn what changes you can make to improve your health and wellness and to prevent falls. This information is not intended to replace advice given to you by your health care provider. Make sure you discuss any questions you have with your health care provider. Document Revised: 09/16/2018 Document Reviewed: 04/08/2017 Elsevier Patient Education  2020 [employer].

## 2019-08-17 NOTE — Patient Instructions (Addendum)
  Johnathan Arnold , Thank you for taking time to come for your Medicare Wellness Visit. I appreciate your ongoing commitment to your health goals. Please review the following plan we discussed and let me know if I can assist you in the future.   These are the goals we discussed: Goals      Patient Stated   . Healthy Lifestyle (pt-stated)     Stay active        This is a list of the screening recommended for you and due dates:  Health Maintenance  Topic Date Due  . Cologuard (Stool DNA test)  01/12/1995  . Hemoglobin A1C  06/22/2019  . Eye exam for diabetics  12/13/2019  . Complete foot exam   08/16/2020  . Tetanus Vaccine  12/18/2020  . Flu Shot  Completed  .  Hepatitis C: One time screening is recommended by Center for Disease Control  (CDC) for  adults born from [birth year] through 1966.   Completed  . Pneumonia vaccines  Completed

## 2019-08-17 NOTE — Progress Notes (Addendum)
Subjective:   Johnathan Arnold. is a 74 y.o. male who presents for Medicare Annual/Subsequent preventive examination.  Review of Systems:  No ROS.  Medicare Wellness Virtual Visit.  Visual/audio telehealth visit. See social history for additional risk factors.   Cardiac Risk Factors include: advanced age (>8men, >33 women);hypertension;male gender     Objective:    Vitals: Ht 5\' 7"  (1.702 m)   Wt 191 lb (86.6 kg)   BMI 29.91 kg/m   Body mass index is 29.91 kg/m.  Advanced Directives 08/17/2019 04/04/2019 01/25/2019 08/13/2018 02/01/2018 08/11/2017 01/19/2017  Does Patient Have a Medical Advance Directive? Yes Yes Yes Yes No Yes Yes  Type of Paramedic of [place];Living will Healthcare Power of Attorney Living will;Healthcare Power of [place];Living will - [place];Living will [place];Living will  Does patient want to make changes to medical advance directive? No - Patient declined No - Patient declined - No - Patient declined - No - Patient declined -  Copy of [place] in Chart? No - copy requested No - copy requested - No - copy requested - No - copy requested -  Would patient like information on creating a medical advance directive? - - - - No - Patient declined - -    Tobacco Social History   Tobacco Use  Smoking Status Passive Smoke Exposure - Never Smoker  Smokeless Tobacco Never Used     Counseling given: Not Answered   Clinical Intake:  Pre-visit preparation completed: Yes        Diabetes: No  How often do you need to have someone help you when you read instructions, pamphlets, or other written materials from your doctor or pharmacy?: 1 - Never  Interpreter Needed?: No     Past Medical History:  Diagnosis Date  . 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 Diabetes mellitus without complication ([place])   . History of cardiac  catheterization 2011   [facility]  . Hyperlipidemia   . Hypertension   . Hypertriglyceridemia   . Parkinson's disease ([place])   . 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 Vertigo    Past Surgical History:  Procedure Laterality Date  . CARDIAC CATHETERIZATION  05-19-2010   [facility]: Patent grafts. LIMA to LAD, SVG to D1, OM1 and RPDA  . CORONARY ARTERY BYPASS GRAFT  03/1998   5 vessel, [facility]  . RIGHT HEART CATH N/A 04/04/2019   Procedure: RIGHT HEART CATH;  Surgeon: Wellington Hampshire, MD;  Location: [facility];  Service: Cardiovascular;  Laterality: N/A;  . RIGHT/LEFT HEART CATH AND CORONARY ANGIOGRAPHY N/A 02/01/2018   Procedure: RIGHT/LEFT HEART CATH AND CORONARY ANGIOGRAPHY;  Surgeon: Wellington Hampshire, MD;  Location: [facility];  Service: Cardiovascular;  Laterality: N/A;   Family History  Problem Relation Age of Onset  . Heart attack Mother 78  . Hypertension Mother   . Heart attack Father 1  . Heart disease Father   . Heart disease Brother   . Healthy Son    Social History   Socioeconomic History  . Marital status: Married    Spouse name: Not on file  . Number of children: 2  . Years of education: Not on file  . Highest education level: Master's degree (e.g., MA, MS, MEng, MEd, MSW, MBA)  Occupational History  . Occupation: retired    Comment: IT work  Tobacco Use  . Smoking status: Passive Smoke Exposure - Never Smoker  . Smokeless tobacco: Never Used  Substance and Sexual Activity  . Alcohol use: Yes    Comment: occasional beer  . Drug use: No  . Sexual activity: Not Currently  Other Topics Concern  . Not on file  Social History Narrative  . Not on file   Social Determinants of Health   Financial Resource Strain:   . Difficulty of Paying Living Expenses: Not on file  Food Insecurity:   . Worried About Charity fundraiser in the Last Year: Not on file  . Ran Out of Food in the Last Year: Not on file  Transportation Needs:   . Lack of Transportation  (Medical): Not on file  . Lack of Transportation (Non-Medical): Not on file  Physical Activity: Sufficiently Active  . Days of Exercise per Week: 3 days  . Minutes of Exercise per Session: 60 min  Stress: No Stress Concern Present  . Feeling of Stress : Not at all  Social Connections: Unknown  . Frequency of Communication with Friends and Family: More than three times a week  . Frequency of Social Gatherings with Friends and Family: More than three times a week  . Attends Religious Services: Not on file  . Active Member of Clubs or Organizations: Yes  . Attends Archivist Meetings: More than 4 times per year  . Marital Status: Married    Outpatient Encounter Medications as of 08/17/2019  Medication Sig  . aspirin EC 81 MG tablet Take 162 mg by mouth at bedtime.  . carbidopa-levodopa (SINEMET CR) 50-200 MG tablet TAKE ONE TABLET BY MOUTH AT BEDTIME  . carbidopa-levodopa (SINEMET IR) 25-100 MG tablet Take 1.5 tablets by mouth 3 (three) times daily.  . carvedilol (COREG) 6.25 MG tablet TAKE ONE TABLET BY MOUTH TWICE A DAY  . clonazePAM (KLONOPIN) 0.5 MG tablet TAKE 1.5 TABLETS BY MOUTH EVERY NIGHT AT BEDTIME  . diazepam (VALIUM) 5 MG tablet Take 1 tablet (5 mg total) by mouth every 8 (eight) hours as needed for anxiety (or vertigo).  . furosemide (LASIX) 40 MG tablet Take 1 tablet (40 mg total) by mouth daily.  . isosorbide mononitrate (IMDUR) 60 MG 24 hr tablet TAKE ONE TABLET BY MOUTH DAILY  . losartan (COZAAR) 25 MG tablet TAKE ONE TABLET BY MOUTH DAILY  . Multiple Vitamin (MULTIVITAMIN) tablet Take 1 tablet by mouth daily.    . nitroGLYCERIN (NITROSTAT) 0.4 MG SL tablet Place 1 tablet (0.4 mg total) under the tongue every 5 (five) minutes as needed for chest pain.  . Omega-3 Fatty Acids (FISH OIL) 1200 MG CAPS Take 1,200 mg by mouth daily.   Marland Kitchen omeprazole (PRILOSEC) 20 MG capsule TAKE 1 CAPSULE BY MOUTH EVERY MORNING AS DIRECTED (Patient taking differently: Take 20 mg by mouth  every other day. In the morning.)  . polyethylene glycol (MIRALAX / GLYCOLAX) packet Take 17 g by mouth daily.   . ranolazine (RANEXA) 1000 MG SR tablet Take 1 tablet (1,000 mg total) by mouth 2 (two) times daily.  . rosuvastatin (CRESTOR) 10 MG tablet Take 1 tablet (10 mg total) by mouth daily.  . tamsulosin (FLOMAX) 0.4 MG CAPS capsule Take 1 capsule (0.4 mg total) by mouth daily. (Patient taking differently: Take 0.4 mg by mouth at bedtime. )   No facility-administered encounter medications on file as of 08/17/2019.    Activities of Daily Living In your present state of health, do you  have any difficulty performing the following activities: 08/17/2019 04/04/2019  Hearing? N Y  Vision? N N  Difficulty concentrating or making decisions? N N  Walking or climbing stairs? N N  Dressing or bathing? N N  Doing errands, shopping? N -  Preparing Food and eating ? N -  Using the Toilet? N -  In the past six months, have you accidently leaked urine? N -  Do you have problems with loss of bowel control? N -  Managing your Medications? N -  Managing your Finances? N -  Housekeeping or managing your Housekeeping? N -  Some recent data might be hidden    Patient Care Team: [name], MD as PCP - General (Internal Medicine) Isaias Cowman, MD (Internal Medicine) Tat, Eustace Quail, DO as Consulting Physician (Neurology)   Assessment:   This is a routine wellness examination for Kaj.  Nurse connected with patient 08/17/19 at 12:30 PM EST by a telephone enabled telemedicine application and verified that I am speaking with the correct person using two identifiers. Patient stated full name and DOB. Patient gave permission to continue with virtual visit. Patient's location was at home and Nurse's location was at [place] office.   Patient is alert and oriented x3. Patient denies difficulty focusing or concentrating. Patient likes to read and watch television for brain stimulation.    Health Maintenance Due: Colorguard- ordered by pcp See completed HM at the end of note.   Eye: Visual acuity not assessed. Virtual visit. Followed by their ophthalmologist.  Dental: Visits every 6 months.    Hearing: Hearing aids- yes  Safety:  Patient feels safe at home- yes Patient does have smoke detectors at home- yes Patient does wear sunscreen or protective clothing when in direct sunlight - yes Patient does wear seat belt when in a moving vehicle - yes Patient drives- yes Adequate lighting in walkways free from debris- yes Grab bars and handrails used as appropriate- yes Ambulates with an assistive device- no Cell phone on person when ambulating outside of the home- yes  Social: Alcohol intake - yes      Smoking history- never Smokers in home? none Illicit drug use? none  Medication: Taking as directed and without issues.  Pill box in use -yes  Self managed - yes   Covid-19: Precautions and sickness symptoms discussed. Wears mask, social distancing, hand hygiene as appropriate.   Activities of Daily Living Patient denies needing assistance with: household chores, feeding themselves, getting from bed to chair, getting to the toilet, bathing/showering, dressing, managing money, or preparing meals.  Wife to assist with ADLs as needed.   Discussed the importance of a healthy diet, water intake and the benefits of aerobic exercise.  Physical activity- heart track 3 times weekly, rock steady boxing   Diet:  Regular Water: good intake Caffeine: not very often  Other Providers Patient Care Team: [name], MD as PCP - General (Internal Medicine) Isaias Cowman, MD (Internal Medicine) Tat, Eustace Quail, DO as Consulting Physician (Neurology)  Exercise Activities and Dietary recommendations Current Exercise Habits: Home exercise routine, Type of exercise: walking(Rock steady), Time (Minutes): 60, Frequency (Times/Week): 3, Weekly Exercise  (Minutes/Week): 180, Intensity: Mild  Goals      Patient Stated   . Healthy Lifestyle (pt-stated)     Stay active        Fall Risk Fall Risk  08/17/2019 08/17/2019 01/25/2019 08/09/2018 03/08/2018  Falls in the past year? 0 0 0 1 No  Number falls  in past yr: - - 0 0 -  Injury with Fall? - - 0 0 -  Risk for fall due to : - - - History of fall(s) -  Follow up Falls evaluation completed Falls evaluation completed - Falls evaluation completed -   Timed Get Up and Go Performed: no, virtual visit  Depression Screen PHQ 2/9 Scores 08/17/2019 08/13/2018 08/11/2017 08/05/2016  PHQ - 2 Score 0 0 0 0  PHQ- 9 Score 1 - - -    Cognitive Function MMSE - Mini Mental State Exam 08/05/2016  Orientation to time 5  Orientation to Place 5  Registration 3  Attention/ Calculation 5  Recall 3  Language- name 2 objects 2  Language- repeat 1  Language- follow 3 step command 3  Language- read & follow direction 1  Write a sentence 1  Copy design 1  Total score 30     6CIT Screen 08/17/2019 08/13/2018 08/11/2017  What Year? 0 points 0 points 0 points  What month? 0 points 0 points 0 points  What time? 0 points 0 points 0 points  Count back from 20 0 points 0 points 0 points  Months in reverse 0 points 0 points 0 points  Repeat phrase 0 points 0 points -  Total Score 0 0 -    Immunization History  Administered Date(s) Administered  . Fluad Quad(high Dose 65+) 03/09/2019  . Influenza Split 02/19/2011, 04/10/2014  . Influenza, High Dose Seasonal PF 04/01/2013, 02/28/2015, 05/17/2018  . Influenza,inj,Quad PF,6+ Mos 03/10/2016, 03/13/2017  . Influenza-Unspecified 03/09/2012  . PFIZER SARS-COV-2 Vaccination 07/15/2019, 08/05/2019  . Pneumococcal Conjugate-13 06/30/2013  . Pneumococcal Polysaccharide-23 05/21/2011, 08/06/2016  . Tdap 12/19/2010  . Zoster 05/09/2012   Screening Tests Health Maintenance  Topic Date Due  . Fecal DNA (Cologuard)  01/12/1995  . HEMOGLOBIN A1C  06/22/2019  .  OPHTHALMOLOGY EXAM  12/13/2019  . FOOT EXAM  08/16/2020  . TETANUS/TDAP  12/18/2020  . INFLUENZA VACCINE  Completed  . Hepatitis C Screening  Completed  . PNA vac Low Risk Adult  Completed       Plan:   Keep all routine maintenance appointments.   Medicare Attestation I have personally reviewed: The patient's medical and social history Their use of alcohol, tobacco or illicit drugs Their current medications and supplements The patient's functional ability including ADLs,fall risks, home safety risks, cognitive, and hearing and visual impairment Diet and physical activities Evidence for depression   I have reviewed and discussed with patient certain preventive protocols, quality metrics, and best practice recommendations.     OBrien-Blaney, Domnique Vantine L, LPN  [id number]   I have reviewed the above information and agree with above.   Deborra Medina, MD

## 2019-08-17 NOTE — Progress Notes (Addendum)
Patient ID: Johnathan Phegley., male    DOB: May 03, 1946  Age: 74 y.o. MRN: [id number]  The patient is here for  follow up and management of other chronic and acute problems.  This visit occurred during the SARS-CoV-2 public health emergency.  Safety protocols were in place, including screening questions prior to the visit, additional usage of staff PPE, and extensive cleaning of exam room while observing appropriate contact time as indicated for disinfecting solutions.     The risk factors are reflected in the social history.  The roster of all physicians providing medical care to patient - is listed in the Snapshot section of the chart.  Activities of daily living:  The patient is 100% independent in all ADLs: dressing, toileting, feeding as well as independent mobility  Home safety : The patient has smoke detectors in the home. They wear seatbelts.  There are no firearms at home. There is no violence in the home.   There is no risks for hepatitis, STDs or HIV. There is no   history of blood transfusion. They have no travel history to infectious disease endemic areas of the world.  The patient has seen their dentist in the last six month. They have seen their eye doctor in the last year. They admit to slight hearing difficulty with regard to whispered voices and some television programs.  They have deferred audiologic testing in the last year.  They do not  have excessive sun exposure. Discussed the need for sun protection: hats, long sleeves and use of sunscreen if there is significant sun exposure.   Diet: the importance of a healthy diet is discussed. They do have a healthy diet.  The benefits of regular aerobic exercise were discussed. He [facility] to [facility] and started adding weighted exercises to legs to strengthen them.  No difference in symptoms since statin change. Also participating in the [facility] 3 days per week , same days as heart track   Sleeping with CPAP "ok"  Using  clonazepam to  Acclimate along with ER Sinemet 45 minutes before sleep. (both prescribed by Tat.   She has reduced his clonazepam to 1 tablet at bedtime .  He  No longer feels refreshed any more if he sleeps too much. but not getting up to urinate anymore.    Depression screen: there are no signs or vegative symptoms of depression- irritability, change in appetite, anhedonia, sadness/tearfullness.  Cognitive assessment: the patient manages all their financial and personal affairs and is actively engaged. They could relate day,date,year and events; recalled 2/3 objects at 3 minutes; performed clock-face test normally.  The following portions of the patient's history were reviewed and updated as appropriate: allergies, current medications, past family history, past medical history,  past surgical history, past social history  and problem list.  Visual acuity was not assessed per patient preference since she has regular follow up with her ophthalmologist. Hearing and body mass index were assessed and reviewed.   During the course of the visit the patient was educated and counseled about appropriate screening and preventive services including : fall prevention , diabetes screening, nutrition counseling, colorectal cancer screening, and recommended immunizations.    CC: The primary encounter diagnosis was Hypertension, unspecified type. 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   1) he has been experiencing low back pain radiates to left buttock if he does anything strenuous,   Denies foot drop,  Numbness.  No history of trauma.  Wants to have a few sessions with PT     2) episodes of chest pain have resolved since his procedure   3)  Patient has received both doses of the available COVID 19 vaccine without complications.  Patient continues to mask when outside of the home except when walking in yard or at safe distances from others .  Patient denies any change in mood or development of unhealthy behaviors resuting from the pandemic's restriction of activities and socialization.    4) He feels generally well, is exercising several times per week and checking blood sugars rarely.  Denies any recent hypoglyemic events.  Taking his medications as directed. Following a carbohydrate modified diet 6 days per week. Denies numbness, burning and tingling of extremities. Appetite is good.      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   He has a past surgical history that includes Coronary artery bypass graft (03/1998); Cardiac catheterization (05-19-2010); RIGHT/LEFT HEART CATH AND CORONARY ANGIOGRAPHY (N/A, 02/01/2018); and RIGHT HEART CATH (N/A, 04/04/2019).   His family history includes Healthy in his son; Heart attack (age of onset: 37) in his father; Heart attack (age of onset: 50) in his mother; Heart disease in his brother and father; Hypertension in his mother.He reports that he is a non-smoker but has been exposed to tobacco smoke. He has never used smokeless tobacco. He reports current alcohol use. He reports that he does not use drugs.  Outpatient Medications Prior to Visit  Medication Sig Dispense Refill  . aspirin EC 81 MG tablet Take 162 mg by mouth at bedtime.    . carbidopa-levodopa (SINEMET CR) 50-200 MG tablet TAKE ONE  TABLET BY MOUTH AT BEDTIME 30 tablet 4  . carbidopa-levodopa (SINEMET IR) 25-100 MG tablet Take 1.5 tablets by mouth 3 (three) times daily. 405 tablet 1  . carvedilol (COREG) 6.25 MG tablet TAKE ONE TABLET BY MOUTH TWICE A DAY 180 tablet 0  . clonazePAM (KLONOPIN) 0.5 MG tablet TAKE 1.5 TABLETS BY MOUTH EVERY NIGHT AT BEDTIME 45 tablet 4  . diazepam (VALIUM) 5 MG tablet Take 1 tablet (5 mg total) by mouth every 8 (eight) hours as needed for anxiety (or vertigo). 60 tablet 1  . isosorbide mononitrate (IMDUR) 60 MG 24 hr tablet TAKE ONE TABLET BY MOUTH DAILY 90 tablet 3  . losartan (COZAAR) 25 MG tablet TAKE ONE TABLET BY MOUTH DAILY 90 tablet 1  . Multiple Vitamin (MULTIVITAMIN) tablet Take 1 tablet by mouth daily.      . nitroGLYCERIN (NITROSTAT) 0.4 MG SL tablet Place 1 tablet (0.4 mg total) under the tongue every 5 (five) minutes as needed for chest pain. 25 tablet 1  . Omega-3 Fatty Acids (FISH OIL) 1200 MG CAPS Take 1,200 mg by mouth daily.     Marland Kitchen omeprazole (PRILOSEC) 20 MG capsule TAKE 1 CAPSULE BY MOUTH EVERY MORNING AS DIRECTED (Patient taking differently: Take 20 mg by mouth every other day. In the morning.) 90 capsule 1  . polyethylene glycol (MIRALAX / GLYCOLAX) packet  Take 17 g by mouth daily.     . ranolazine (RANEXA) 1000 MG SR tablet Take 1 tablet (1,000 mg total) by mouth 2 (two) times daily. 60 tablet 3  . tamsulosin (FLOMAX) 0.4 MG CAPS capsule Take 1 capsule (0.4 mg total) by mouth daily. (Patient taking differently: Take 0.4 mg by mouth at bedtime. ) 90 capsule 3  . furosemide (LASIX) 40 MG tablet Take 1 tablet (40 mg total) by mouth daily. 30 tablet 5  . rosuvastatin (CRESTOR) 10 MG tablet Take 1 tablet (10 mg total) by mouth daily. 90 tablet 3   No facility-administered medications prior to visit.    Review of Systems   Patient denies headache, fevers, malaise, unintentional weight loss, skin rash, eye pain, sinus congestion and sinus pain, sore throat, dysphagia,   hemoptysis , cough, dyspnea, wheezing, chest pain, palpitations, orthopnea, edema, abdominal pain, nausea, melena, diarrhea, constipation, flank pain, dysuria, hematuria, urinary  Frequency, nocturia, numbness, tingling, seizures,  Focal weakness, Loss of consciousness,  Tremor, insomnia, depression, anxiety, and suicidal ideation.      Objective:  BP 128/64 (BP Location: Left Arm, Patient Position: Sitting, Cuff Size: Normal)   Pulse (!) 46   Temp (!) 97.4 F (36.3 C) (Temporal)   Resp 14   Ht 5\' 7"  (1.702 m)   Wt 191 lb 12.8 oz (87 kg)   SpO2 98%   BMI 30.04 kg/m   Physical Exam  General appearance: alert, cooperative and appears stated age Ears: normal TM's and external ear canals both ears Throat: lips, mucosa, and tongue normal; teeth and gums normal Neck: no adenopathy, no carotid bruit, supple, symmetrical, trachea midline and thyroid not enlarged, symmetric, no tenderness/mass/nodules Back: symmetric, no curvature. ROM normal. No CVA tenderness. Lungs: clear to auscultation bilaterally Heart: regular rate and rhythm, S1, S2 normal, no murmur, click, rub or gallop Abdomen: soft, non-tender; bowel sounds normal; no masses,  no organomegaly Pulses: 2+ and symmetric Skin: Skin color, texture, turgor normal. No rashes or lesions Lymph nodes: Cervical, supraclavicular, and axillary nodes normal.  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  Medical management per [facility]      Hypertension - Primary    Well controlled on current regimen. Renal function stable, no changes today.      Hyperlipidemia    Statin upgraded to rosuvastatin by Arida due to residual disease. LDL is at goal  Lab Results  Component Value Date   CHOL 113 08/17/2019   HDL 60.50 08/17/2019   LDLCALC 32 08/17/2019   LDLDIRECT 38.0 08/06/2015   TRIG 105.0 08/17/2019   CHOLHDL 2 08/17/2019         Parkinson's disease ([place])    Managed with ER Sinemet qhs.  Attending [employer] program 3 days per week . No falls.       CKD (chronic  kidney disease) stage 3, GFR 30-59 ml/min   Relevant Orders   Comprehensive metabolic panel (Completed)   VITAMIN D 25 Hydroxy (Vit-D Deficiency, Fractures) (Completed)   Leg pain, bilateral    No change with change in statin.  Describes sensation as more discomfort and weakness.  Has started using leg weights at gym       Insomnia    Managed with clonazepam prescribed by Dr Tat 1 mg dose currently       Pulmonary hypertension ([place])    Moderate to severe,  By ECHO and right heart  Cath Oct 2020.  May benefit from sildenafil       Low back pain of over 3 months duration    Pain radiates to left buttock with exercise  .  Exam is normal. PT referral made.        Other Visit Diagnoses    Proximal leg weakness       Prostate cancer screening       Relevant Orders   PSA, Medicare (Completed)   Fatigue, unspecified type       Relevant Orders   TSH (Completed)   Colon cancer screening       Relevant Orders   Cologuard     A total of 40 minutes was spent with patient more than half of which was spent in counseling patient on the above mentioned issues , reviewing and explaining recent labs and imaging studies done, and coordination of care.  I am having Johnathan Arnold. maintain his multivitamin, Fish Oil, polyethylene glycol, nitroGLYCERIN, diazepam, omeprazole, furosemide,  tamsulosin, aspirin EC, rosuvastatin, ranolazine, isosorbide mononitrate, carvedilol, carbidopa-levodopa, carbidopa-levodopa, losartan, and clonazePAM.  No orders of the defined types were placed in this encounter.   There are no discontinued medications.  Follow-up: No follow-ups on file.   [name], MD

## 2019-08-17 NOTE — Assessment & Plan Note (Addendum)
Moderate to severe,  By ECHO and right heart  Cath Oct 2020.  May benefit from sildenafil

## 2019-08-17 NOTE — Assessment & Plan Note (Signed)
Well controlled on current regimen. Renal function stable, no changes today. 

## 2019-08-17 NOTE — Assessment & Plan Note (Signed)
No change with change in statin.  Describes sensation as more discomfort and weakness.  Has started using leg weights at gym

## 2019-08-19 ENCOUNTER — Ambulatory Visit: Payer: Medicare Other | Admitting: Cardiovascular Disease

## 2019-08-19 ENCOUNTER — Other Ambulatory Visit: Payer: Self-pay | Admitting: Internal Medicine

## 2019-08-19 DIAGNOSIS — M545 Low back pain, unspecified: Secondary | ICD-10-CM

## 2019-08-19 DIAGNOSIS — G8929 Other chronic pain: Secondary | ICD-10-CM

## 2019-08-19 NOTE — Assessment & Plan Note (Signed)
Managed with ER Sinemet qhs.  Attending [employer] program 3 days per week . No falls.

## 2019-08-19 NOTE — Assessment & Plan Note (Signed)
Pain radiates to left buttock with exercise  .  Exam is normal. PT referral made.

## 2019-08-19 NOTE — Assessment & Plan Note (Signed)
Managed with clonazepam prescribed by Dr Tat 1 mg dose currently

## 2019-08-19 NOTE — Assessment & Plan Note (Signed)
Statin upgraded to rosuvastatin by Arida due to residual disease. LDL is at goal  Lab Results  Component Value Date   CHOL 113 08/17/2019   HDL 60.50 08/17/2019   LDLCALC 32 08/17/2019   LDLDIRECT 38.0 08/06/2015   TRIG 105.0 08/17/2019   CHOLHDL 2 08/17/2019

## 2019-08-19 NOTE — Assessment & Plan Note (Signed)
  Remains well controlled  On diet alone.  Continue ASA, ARB, and statin   Lab Results  Component Value Date   HGBA1C 5.8 08/17/2019   Lab Results  Component Value Date   MICROALBUR <0.7 08/17/2019  '

## 2019-08-19 NOTE — Assessment & Plan Note (Signed)
Likely due to untreated proximal LAD stenosis.  Medical management per Fletcher Anon

## 2019-09-01 ENCOUNTER — Encounter: Payer: Self-pay | Admitting: Cardiovascular Disease

## 2019-09-01 ENCOUNTER — Other Ambulatory Visit: Payer: Self-pay

## 2019-09-01 ENCOUNTER — Ambulatory Visit (INDEPENDENT_AMBULATORY_CARE_PROVIDER_SITE_OTHER): Payer: Medicare Other | Admitting: Cardiovascular Disease

## 2019-09-01 VITALS — BP 130/56 | HR 52 | Ht 67.0 in | Wt 193.5 lb

## 2019-09-01 DIAGNOSIS — Z1211 Encounter for screening for malignant neoplasm of colon: Secondary | ICD-10-CM | POA: Diagnosis not present

## 2019-09-01 DIAGNOSIS — E7800 Pure hypercholesterolemia, unspecified: Secondary | ICD-10-CM

## 2019-09-01 DIAGNOSIS — I10 Essential (primary) hypertension: Secondary | ICD-10-CM | POA: Diagnosis not present

## 2019-09-01 DIAGNOSIS — I25118 Atherosclerotic heart disease of native coronary artery with other forms of angina pectoris: Secondary | ICD-10-CM

## 2019-09-01 DIAGNOSIS — E1159 Type 2 diabetes mellitus with other circulatory complications: Secondary | ICD-10-CM

## 2019-09-01 DIAGNOSIS — I272 Pulmonary hypertension, unspecified: Secondary | ICD-10-CM

## 2019-09-01 DIAGNOSIS — I209 Angina pectoris, unspecified: Secondary | ICD-10-CM | POA: Diagnosis not present

## 2019-09-01 DIAGNOSIS — I739 Peripheral vascular disease, unspecified: Secondary | ICD-10-CM | POA: Diagnosis not present

## 2019-09-01 MED ORDER — FUROSEMIDE 20 MG PO TABS: 20.0000 mg | ORAL_TABLET | Freq: Every day | ORAL | 5 refills | Status: DC

## 2019-09-01 MED ORDER — NITROGLYCERIN 0.4 MG SL SUBL: 0.4000 mg | SUBLINGUAL_TABLET | SUBLINGUAL | 1 refills | Status: DC | PRN

## 2019-09-01 NOTE — Progress Notes (Signed)
Cardiology Office Note   Date:  09/01/2019   ID:  Johnathan Camardo., DOB 06-Apr-1946, MRN [id number]  PCP:  [name], MD  Cardiologist:   [name], MD   Chief Complaint  Patient presents with  . other    4 month follow up. Meds reviewed by the pt. verbally. Pt. c/o leg weakness, shortness of breath and chest pain at times.       History of Present Illness: Johnathan Nydam. is a 74 y.o. male who presents for a followup visit.  He has known history of coronary artery disease status post CABG in 1999. He also has known history of hypertension, chronic kidney disease, hyperlipidemia, Parkinson's disease, sleep apnea on CPAP and type 2 diabetes.  Previous carotid Doppler showed mild nonobstructive bilateral disease.  A right and left cardiac catheterization was done in August 2019 which showed occluded native arteries with patent grafts including LIMA to LAD, SVG to diagonal, SVG to OM and SVG to distal RCA.  Right heart catheterization showed normal filling pressures, mild pulmonary hypertension and normal cardiac output. He has chronic bilateral exertional leg pain with normal lower extremity arterial Doppler in 2018.    He had worsening dyspnea again last year and repeat echocardiogram was done in September which showed normal ejection fraction, grade 2 diastolic dysfunction, moderately dilated right ventricle with moderate to severe pulmonary hypertension.  Estimated pulmonary systolic pressure was 61 mmHg.  IVC was dilated with less than 50% respiratory variability.  Based on this, I added furosemide 40 mg once daily.  I scheduled him for VQ scan which showed no evidence of acute or chronic pulmonary embolism.  Right heart catheterization in October showed moderate pulmonary hypertension at 54 over 13 mmHg with wedge pressure of 17 mmHg.  Pulmonary vascular resistance was 2.22 [place] units.  Pulmonary hypertension was felt to be of a mixed etiology.   He has underlying  sleep apnea but uses CPAP on a regular basis.  He has been doing reasonably well with stable exertional dyspnea.  His creatinine has gradually worsened and most recently was 2.  He resumed going to the gym at cardiac rehab.  His biggest complaint continues to be bilateral thigh pain with exercise that resolved with resting.  Past Medical History:  Diagnosis Date  . 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 Diabetes mellitus without complication ([place])   . History of cardiac catheterization 2011   [facility]  . Hyperlipidemia   . Hypertension   . Hypertriglyceridemia   . Parkinson's disease ([place])   . 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 Vertigo     Past Surgical History:  Procedure Laterality Date  . CARDIAC CATHETERIZATION  05-19-2010   [facility]: Patent grafts. LIMA to LAD, SVG to D1, OM1 and RPDA  . CORONARY ARTERY BYPASS GRAFT  03/1998   5 vessel, [facility]  . RIGHT HEART CATH N/A 04/04/2019   Procedure: RIGHT HEART CATH;  Surgeon: Wellington Hampshire, MD;  Location: [facility];  Service: Cardiovascular;  Laterality: N/A;  . RIGHT/LEFT HEART CATH AND CORONARY ANGIOGRAPHY N/A 02/01/2018   Procedure: RIGHT/LEFT HEART CATH AND CORONARY ANGIOGRAPHY;  Surgeon: Wellington Hampshire, MD;  Location: [facility];  Service: Cardiovascular;  Laterality: N/A;     Current Outpatient Medications  Medication Sig Dispense Refill  . aspirin EC 81 MG tablet Take 162 mg by mouth at bedtime.    Marland Kitchen  carbidopa-levodopa (SINEMET CR) 50-200 MG tablet TAKE ONE TABLET BY MOUTH AT BEDTIME 30 tablet 4  . carbidopa-levodopa (SINEMET IR) 25-100 MG tablet Take 1.5 tablets by mouth 3 (three) times daily. 405 tablet 1  . carvedilol (COREG) 6.25 MG tablet TAKE ONE TABLET BY MOUTH TWICE A DAY 180 tablet 0  . clonazePAM (KLONOPIN) 0.5 MG tablet TAKE 1.5 TABLETS BY MOUTH EVERY NIGHT AT BEDTIME 45 tablet 4  . diazepam (VALIUM) 5 MG tablet Take 1 tablet (5 mg total) by mouth every 8 (eight) hours as needed  for anxiety (or vertigo). 60 tablet 1  . furosemide (LASIX) 40 MG tablet Take 1 tablet (40 mg total) by mouth daily. 30 tablet 5  . isosorbide mononitrate (IMDUR) 60 MG 24 hr tablet TAKE ONE TABLET BY MOUTH DAILY 90 tablet 3  . losartan (COZAAR) 25 MG tablet TAKE ONE TABLET BY MOUTH DAILY 90 tablet 1  . Multiple Vitamin (MULTIVITAMIN) tablet Take 1 tablet by mouth daily.      . nitroGLYCERIN (NITROSTAT) 0.4 MG SL tablet Place 1 tablet (0.4 mg total) under the tongue every 5 (five) minutes as needed for chest pain. 25 tablet 1  . Omega-3 Fatty Acids (FISH OIL) 1200 MG CAPS Take 1,200 mg by mouth daily.     Marland Kitchen omeprazole (PRILOSEC) 20 MG capsule TAKE 1 CAPSULE BY MOUTH EVERY MORNING AS DIRECTED (Patient taking differently: Take 20 mg by mouth every other day. In the morning.) 90 capsule 1  . polyethylene glycol (MIRALAX / GLYCOLAX) packet Take 17 g by mouth daily.     . ranolazine (RANEXA) 1000 MG SR tablet Take 1 tablet (1,000 mg total) by mouth 2 (two) times daily. 60 tablet 3  . tamsulosin (FLOMAX) 0.4 MG CAPS capsule Take 1 capsule (0.4 mg total) by mouth daily. (Patient taking differently: Take 0.4 mg by mouth at bedtime. ) 90 capsule 3  . rosuvastatin (CRESTOR) 10 MG tablet Take 1 tablet (10 mg total) by mouth daily. 90 tablet 3   No current facility-administered medications for this visit.    Allergies:   Patient has no known allergies.    Social History:  The patient  reports that he is a non-smoker but has been exposed to tobacco smoke. He has never used smokeless tobacco. He reports current alcohol use. He reports that he does not use drugs.   Family History:  The patient's family history includes Healthy in his Johnathan Arnold; Heart attack (age of onset: 75) in his father; Heart attack (age of onset: 26) in his mother; Heart disease in his brother and father; Hypertension in his mother.    ROS:  Please see the history of present illness.   Otherwise, review of systems are positive for none.    All other systems are reviewed and negative.    PHYSICAL EXAM: VS:  BP (!) 130/56 (BP Location: Left Arm, Patient Position: Sitting, Cuff Size: Normal)   Pulse (!) 52   Ht 5\' 7"  (1.702 m)   Wt 193 lb 8 oz (87.8 kg)   SpO2 96%   BMI 30.31 kg/m  , BMI Body mass index is 30.31 kg/m. GEN: Well nourished, well developed, in no acute distress  HEENT: normal  Neck: no JVD, carotid bruits, or masses Cardiac: RRR; no murmurs, rubs, or gallops,no edema  Respiratory:  clear to auscultation bilaterally, normal work of breathing GI: soft, nontender, nondistended, + BS MS: no deformity or atrophy  Skin: warm and dry, no rash Neuro:  Strength and sensation  are intact Psych: euthymic mood, full affect Femoral pulses mildly diminished bilaterally and distal pulses are palpable  EKG:  EKG is ordered today. The ekg ordered today demonstrates sinus bradycardia with nonspecific ST changes.  Possible old inferior infarct   Recent Labs: 03/31/2019: Hemoglobin 11.3; Platelets 137 08/17/2019: ALT 16; BUN 27; Creatinine, Ser 2.00; Potassium 4.8; Sodium 138; TSH 2.70    Lipid Panel    Component Value Date/Time   CHOL 113 08/17/2019 1055   TRIG 105.0 08/17/2019 1055   HDL 60.50 08/17/2019 1055   CHOLHDL 2 08/17/2019 1055   VLDL 21.0 08/17/2019 1055   LDLCALC 32 08/17/2019 1055   LDLDIRECT 38.0 08/06/2015 1118      Wt Readings from Last 3 Encounters:  09/01/19 193 lb 8 oz (87.8 kg)  08/17/19 191 lb (86.6 kg)  08/17/19 191 lb 12.8 oz (87 kg)        ASSESSMENT AND PLAN:  1.  Coronary artery disease involving native coronary arteries with stable angina: Most recent cardiac catheterization in 2019 showed patent grafts.  He likely has some ischemia in the proximal LAD distribution that is not bypassed.  Continue Ranexa and Imdur.  2.  Moderate pulmonary hypertension: His symptoms are overall stable and he appears to be euvolemic.  He had slight worsening of renal function since he was started  on furosemide.  I elected to decrease the dose to 20 mg daily.    3. Bilateral carotid artery disease:  Mild nonobstructive disease. Continue treatment of risk factors.  4. Hyperlipidemia: I switched him during last visit to rosuvastatin due to thigh pain.  He reports no change in symptoms.  He also had no change in symptoms when he held the medication for 1 week.  5. Essential hypertension: Blood pressure is controlled.  6. Exertional leg pain: Mostly bilateral thigh pain with exertion.  We have to exclude inflow disease.  I think the best option is to proceed with a stress ABI.   Disposition:   FU with me in 6 months  Signed,  [name], MD  09/01/2019 11:40 AM    [place]

## 2019-09-01 NOTE — Patient Instructions (Signed)
Medication Instructions:  Your physician has recommended you make the following change in your medication:   REDUCE Lasix (furosemide) to 20 mg daily  Nitro-glycerin has been refilled today.  *If you need a refill on your cardiac medications before your next appointment, please call your pharmacy*   Lab Work: None ordered If you have labs (blood work) drawn today and your tests are completely normal, you will receive your results only by: Marland Kitchen MyChart Message (if you have MyChart) OR . A paper copy in the mail If you have any lab test that is abnormal or we need to change your treatment, we will call you to review the results.   Testing/Procedures: [name] is recommending a stress ABI vascular study. We will call you to schedule.   Follow-Up: At [facility], you and your health needs are our priority.  As part of our continuing mission to provide you with exceptional heart care, we have created designated Provider Care Teams.  These Care Teams include your primary Cardiologist (physician) and Advanced Practice Providers (APPs -  Physician Assistants and Nurse Practitioners) who all work together to provide you with the care you need, when you need it.  We recommend signing up for the patient portal called "MyChart".  Sign up information is provided on this After Visit Summary.  MyChart is used to connect with patients for Virtual Visits (Telemedicine).  Patients are able to view lab/test results, encounter notes, upcoming appointments, etc.  Non-urgent messages can be sent to your provider as well.   To learn more about what you can do with MyChart, go to [email or web address].    Your next appointment:   6 month(s)  The format for your next appointment:   In Person  Provider:    You may see  [name] or one of the following Advanced Practice Providers on your designated Care Team:    Murray Hodgkins, NP  Christell Faith, PA-C  Marrianne Mood, PA-C    Other  Instructions N/A

## 2019-09-05 LAB — COLOGUARD: Cologuard: NEGATIVE

## 2019-09-07 ENCOUNTER — Other Ambulatory Visit: Payer: Self-pay

## 2019-09-07 ENCOUNTER — Ambulatory Visit: Payer: Medicare Other | Attending: Internal Medicine

## 2019-09-07 DIAGNOSIS — M6281 Muscle weakness (generalized): Secondary | ICD-10-CM | POA: Diagnosis not present

## 2019-09-07 DIAGNOSIS — M545 Low back pain: Secondary | ICD-10-CM | POA: Insufficient documentation

## 2019-09-07 DIAGNOSIS — G8929 Other chronic pain: Secondary | ICD-10-CM | POA: Insufficient documentation

## 2019-09-07 NOTE — Therapy (Signed)
[facility] [address], [place], [address] Phone: [phone]   Fax:  [phone]  Physical Therapy Evaluation  Patient Details  Name: Johnathan Arnold. MRN: [id number] Date of Birth: 1945/11/08 Referring Provider (PT): Deborra Medina, MD   Encounter Date: 09/07/2019  PT End of Session - 09/07/19 0804    Visit Number  1    Number of Visits  13    Date for PT Re-Evaluation  10/20/19    Authorization Type  1    Authorization Time Period  of 10 progress report    PT Start Time  0805    PT Stop Time  0901    PT Time Calculation (min)  56 min    Activity Tolerance  Patient tolerated treatment well    Behavior During Therapy  [facility] for tasks assessed/performed       Past Medical History:  Diagnosis Date  . 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 Diabetes mellitus without complication ([place])   . History of cardiac catheterization 2011   [facility]  . Hyperlipidemia   . Hypertension   . Hypertriglyceridemia   . Parkinson's disease ([place])   . 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 Vertigo     Past Surgical History:  Procedure Laterality Date  . CARDIAC CATHETERIZATION  05-19-2010   [facility]: Patent grafts. LIMA to LAD, SVG to D1, OM1 and RPDA  . CORONARY ARTERY BYPASS GRAFT  03/1998   5 vessel, [facility]  . RIGHT HEART CATH N/A 04/04/2019   Procedure: RIGHT HEART CATH;  Surgeon: Wellington Hampshire, MD;  Location: [place] [facility];  Service: Cardiovascular;  Laterality: N/A;  . RIGHT/LEFT HEART CATH AND CORONARY ANGIOGRAPHY N/A 02/01/2018   Procedure: RIGHT/LEFT HEART CATH AND CORONARY ANGIOGRAPHY;  Surgeon: Wellington Hampshire, MD;  Location: [place] [facility];  Service: Cardiovascular;  Laterality: N/A;    There were no vitals filed for this visit.   Subjective Assessment - 09/07/19 0807    Subjective  L low back: 0/10 currently (pt sitting), 5/10 at worst for the past 3 months    Pertinent History   Chronic L low back pain without sciatica. Has a lot of weakness and soreness B LE. Had a back x-ray which revealed a bulging disc. 5-10 years ago, pt fracured lower back and ever since then his back would hurt when he is standing. Went to a Restaurant manager, fast food which helped a lot back then. However, nowadays, his back would hurt again the next day. Denies loss of bowel or bladder control. Denies LE paresthesia. No falls within the past 6 months. No fear of falling. Next follow up appointment with Dr. Derrel Nip is in maybe 6 months.  Goes to heart track 3 days a week to work out on some of the machines there.  Pt adds that he has Parkinson's. Currently participates in [employer] for his Parkinson's as recommended by his doctor.  Feels like he has not strength in his legs since 3-4 years ago. Has been checked by his cardiologist. Also talked to his neurologist (Dr. Carles Collet) about it who did not say anything.    Patient Stated Goals  Get rid of the pain. See if PT can help with his leg soress and weakness.    Currently in Pain?  No/denies    Pain Score  0-No pain    Pain Location  Back  Pain Orientation  Left;Lower    Pain Descriptors / Indicators  --   Difficult to describe   Pain Type  Chronic pain    Pain Onset  More than a month ago    Pain Frequency  Occasional    Aggravating Factors   lifting anything, standing (greater than 10-15 minutes); Walking up an incline would cause increased B thigh sorenss.    Pain Relieving Factors  Bending over, Advil, sitting, supine position         [facility] PT Assessment - 09/07/19 0822      Assessment   Medical Diagnosis  Chronic L low back pain without sciatica    Referring Provider (PT)  Deborra Medina, MD    Onset Date/Surgical Date  08/19/19    Hand Dominance  Right    Prior Therapy  no known PT for current condition      Precautions   Precaution Comments  mild old L1 compression deformity based on radiograph 02/17/18      Restrictions   Other  Position/Activity Restrictions  no known restrictions      Prior Function   Vocation  Retired    [employer]  PLOF: better able to lift and perform standing tasks more comfortably for his back      Observation/Other Assessments   Observations  6 MWT 1228 ft (1/10 low central low back pain with B anterior thigh sorenss; pt state walking on an incline will cause more soreness)    Focus on Therapeutic Outcomes (FOTO)   Lumbar FOTO 58      Posture/Postural Control   Posture Comments  B protracted shoulders and neck, R shoulder higher, kyphosis, slight R lateral shift and L trunk rotation, slight L mid thoracic side bend, L hip in ER . Decreased lordosis      AROM   Lumbar Flexion  WFL    Lumbar Extension  limited    Lumbar - Right Side [facility] with R paraspinal "stretch"    Lumbar - Left Side Bend  WFL with R paraspinal stretch    Lumbar - Right Rotation  WFL    Lumbar - Left Rotation  WFL      PROM   Overall PROM Comments  hip extension: L -8 degrees; R -2 degrees; hip PROM in 90/90 IR: L 20 degrees, R 24 degrees      Strength   Right Hip Flexion  4-/5    Right Hip Extension  4-/5    Right Hip ABduction  4/5    Left Hip Flexion  4/5    Left Hip Extension  4-/5    Left Hip ABduction  4/5    Right Knee Flexion  4+/5    Right Knee Extension  5/5    Left Knee Flexion  4/5    Left Knee Extension  5/5      Palpation   Palpation comment  no TTP low back vertebrae. possible slight R lumbar rotation palpated. Decreased thorac spine mobility with CPA      Special Tests   Other special tests  (+) Ely test R and L (retus femoris tension ); (+) long sit test suggesting anterior nutation of L innominate.       Ambulation/Gait   Gait Comments  Pelvic drop during stance phase of gait.                 Objective measurements completed on examination: See above findings.     No latex band  allergies Blood pressure controlled with medicine per pt      Has been  taking statins for years. (stopped taking statins for a week to see if it would make a difference but it did not).    Prefers to sleep on L side  Hx of old mild L1 compression deformity fx based on radiograph 02/17/18    Response to treatment Pt tolerated session well without aggravation of symptoms   Clinical impression Pt is a 74 year old male who came to physical therapy secondary to L low back pain. He also presents with altered posture, bilateral hip weakness, decreased B hip IR PROM L >R, decreased L hip extension PROM, positive special test suggesting lumbopelvic involvement, and difficulty performing standing tasks as well as lifting items secondary to low back pain. Pt will benefit from skilled physical therapy services to address the aforementioned deficits.         PT Education - 09/07/19 1152    Education Details  plan of care    Person(s) Educated  Patient    Methods  Explanation    Comprehension  Verbalized understanding       PT Short Term Goals - 09/07/19 1136      PT SHORT TERM GOAL #1   Title  Pt will be independent with his HEP to decrease pain, improve strength and function.    Time  3    Period  Weeks    Status  New    Target Date  10/20/19        PT Long Term Goals - 09/07/19 1137      PT LONG TERM GOAL #1   Title  Patient will have a decrease in low back pain to 2/10 or less at worst to promote ability to lift items as well as perform standing tasks more comfortably.    Baseline  5/10 low back pain at most for the past 3 months (09/07/2019)    Period  Weeks    Status  New    Target Date  10/20/19      PT LONG TERM GOAL #2   Title  Patient will improve bilateral hip extension and abduction strength by at least 1/2 MMT grade to promote ability to perform standing tasks more comfortably.    Baseline  hip extension 4-/5 R, and L; hip abduction 4/5 R, and L (09/07/2019)    Time  6    Period  Weeks    Status  New    Target Date  10/20/19      PT  LONG TERM GOAL #3   Title  Pt will improve lumbar FOTO score by at least 10 points as a demonstration of improved function.    Baseline  lumbar FOTO 58 (09/07/2019)    Time  6    Period  Weeks    Status  New    Target Date  10/20/19      PT LONG TERM GOAL #4   Title  Pt will improve  6 minute walk test distance to 1328 ft or more to promote ability to ambulate further.    Baseline  1228 ft with 1/10 central low back pain (09/07/2019)    Time  6    Period  Weeks    Status  New    Target Date  10/20/19             Plan - 09/07/19 1131    Clinical Impression Statement  Pt is a 74 year  old male who came to physical therapy secondary to L low back pain. He also presents with altered posture, bilateral hip weakness, decreased B hip IR PROM L >R, decreased L hip extension PROM, positive special test suggesting lumbopelvic involvement, and difficulty performing standing tasks as well as lifting items secondary to low back pain. Pt will benefit from skilled physical therapy services to address the aforementioned deficits.    Personal Factors and Comorbidities  Age;Comorbidity 3+;Past/Current Experience;Time since onset of injury/illness/exacerbation    Comorbidities  DM, HTN, Parkinson's disease    Examination-Activity Limitations  Lift;Carry;Stand    Stability/Clinical Decision Making  Evolving/Moderate complexity   Pain seems to be worsening based on subjective reports   Clinical Decision Making  Moderate    Rehab Potential  Fair    PT Frequency  2x / week    PT Duration  6 weeks    PT Treatment/Interventions  Therapeutic activities;Therapeutic exercise;Neuromuscular re-education;Patient/family education;Manual techniques;Dry needling;Aquatic Therapy;Electrical Stimulation;Iontophoresis 4mg /ml Dexamethasone    Consulted and Agree with Plan of Care  Patient       Patient will benefit from skilled therapeutic intervention in order to improve the following deficits and impairments:  Pain,  Postural dysfunction, Improper body mechanics, Decreased strength  Visit Diagnosis: Chronic left-sided low back pain without sciatica - Plan: PT plan of care cert/re-cert  Muscle weakness (generalized) - Plan: PT plan of care cert/re-cert     Problem List Patient Active Problem List   Diagnosis Date Noted  . Low back pain of over 3 months duration 08/19/2019  . Pulmonary hypertension ([place])   . Insomnia 12/21/2018  . Sleep apnea in adult 12/09/2018  . Periodic limb movement disorder 12/09/2018  . Abnormal findings on diagnostic imaging of lung 07/09/2018  . Lung nodule 07/09/2018  . Pulmonary nodules 05/18/2018  . Wears hearing aid in both ears 05/17/2018  . Leg pain, bilateral 02/20/2018  . Dyspnea   . Effort angina ([facility])   . CKD (chronic kidney disease) stage 3, GFR 30-59 ml/min 09/21/2017  . History of skin cancer in adulthood 08/14/2016  . Parkinson's disease ([place]) 08/09/2016  . Bilateral carotid artery stenosis 04/02/2015  . Vertigo, peripheral 10/17/2014  . Benign prostatic hypertrophy with urinary frequency 01/31/2014  . Encounter for Medicare annual wellness exam 07/02/2013  . Obesity 04/03/2013  . Other malaise and fatigue 09/21/2012  . Hyperlipidemia   . Hypertension   . 3-vessel coronary artery disease   . S/P CABG x 5   . 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 [place], [address] Phone: [phone]   Fax:  [phone]  Name: Johnathan Arnold. MRN: [id number] Date of Birth: [birth year]

## 2019-09-22 ENCOUNTER — Ambulatory Visit: Payer: Medicare Other | Attending: Internal Medicine

## 2019-09-22 ENCOUNTER — Other Ambulatory Visit: Payer: Self-pay

## 2019-09-22 DIAGNOSIS — G8929 Other chronic pain: Secondary | ICD-10-CM | POA: Insufficient documentation

## 2019-09-22 DIAGNOSIS — M6281 Muscle weakness (generalized): Secondary | ICD-10-CM | POA: Diagnosis not present

## 2019-09-22 DIAGNOSIS — M545 Low back pain, unspecified: Secondary | ICD-10-CM

## 2019-09-22 NOTE — Patient Instructions (Signed)
Access Code: [email or web address] URL: [email or web address] Date: 09/22/2019 Prepared by: Joneen Boers  Exercises Seated Hip Internal Rotation AROM - 3 x daily - 7 x weekly - 3 sets - 10 reps Seated Scapular Retraction - 1 x daily - 7 x weekly - 3 sets - 10 reps - 5 seconds hold

## 2019-09-22 NOTE — Therapy (Signed)
[facility] [address] [place], [address] Phone: [phone]   Fax:  [phone]  Physical Therapy Treatment  Patient Details  Name: Johnathan Arnold. MRN: [id number] Date of Birth: 01/13/1946 Referring Provider (PT): Deborra Medina, MD   Encounter Date: 09/22/2019  PT End of Session - 09/22/19 1752    Visit Number  2    Number of Visits  13    Date for PT Re-Evaluation  10/20/19    Authorization Type  2    Authorization Time Period  of 10 progress report    PT Start Time  1752    PT Stop Time  1832    PT Time Calculation (min)  40 min    Activity Tolerance  Patient tolerated treatment well    Behavior During Therapy  [facility] for tasks assessed/performed       Past Medical History:  Diagnosis Date  . 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 Diabetes mellitus without complication ([place])   . History of cardiac catheterization 2011   [facility]  . Hyperlipidemia   . Hypertension   . Hypertriglyceridemia   . Parkinson's disease ([place])   . 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 Vertigo     Past Surgical History:  Procedure Laterality Date  . CARDIAC CATHETERIZATION  05-19-2010   [facility]: Patent grafts. LIMA to LAD, SVG to D1, OM1 and RPDA  . CORONARY ARTERY BYPASS GRAFT  03/1998   5 vessel, [facility]  . RIGHT HEART CATH N/A 04/04/2019   Procedure: RIGHT HEART CATH;  Surgeon: Wellington Hampshire, MD;  Location: [facility];  Service: Cardiovascular;  Laterality: N/A;  . RIGHT/LEFT HEART CATH AND CORONARY ANGIOGRAPHY N/A 02/01/2018   Procedure: RIGHT/LEFT HEART CATH AND CORONARY ANGIOGRAPHY;  Surgeon: Wellington Hampshire, MD;  Location: [facility];  Service: Cardiovascular;  Laterality: N/A;    There were no vitals filed for this visit.  Subjective Assessment - 09/22/19 1754    Subjective  L low back is ok. Yesterday it was hurting. Last couple of days, his calves were sore.    Pertinent  History  Chronic L low back pain without sciatica. Has a lot of weakness and soreness B LE. Had a back x-ray which revealed a bulging disc. 5-10 years ago, pt fracured lower back and ever since then his back would hurt when he is standing. Went to a Restaurant manager, fast food which helped a lot back then. However, nowadays, his back would hurt again the next day. Denies loss of bowel or bladder control. Denies LE paresthesia. No falls within the past 6 months. No fear of falling. Next follow up appointment with Dr. Derrel Nip is in maybe 6 months.  Goes to heart track 3 days a week to work out on some of the machines there.  Pt adds that he has Parkinson's. Currently participates in [employer] for his Parkinson's as recommended by his doctor.  Feels like he has not strength in his legs since 3-4 years ago. Has been checked by his cardiologist. Also talked to his neurologist (Dr. Carles Collet) about it who did not say anything.    Patient Stated Goals  Get rid of the pain. See if PT can help with his leg soress and weakness.    Currently in Pain?  No/denies    Pain Score  0-No pain    Pain Onset  More than a  month ago                               PT Education - 09/22/19 1829    Education Details  ther-ex,  HEP    Person(s) Educated  Patient    Methods  Explanation;Demonstration;Tactile cues;Verbal cues;Handout    Comprehension  Returned demonstration;Verbalized understanding       Objectives  Medbridge  Access Code [email or web address]  Therapeutic exercise  Standing squats 10x  B quad pull   Supine manual hip IR stretch with PT  L 2x  R 2x   Supine with hip in 90/90  Hip IR PROM   L 10x3   R 10x3  S/L rectus femoris stretch   L 20 seconds x 2  R 20 seconds x 2  Seated hip IR  R 10x3  L 10x3   Seated L hip extension isometrics 10x5 seconds   Then with L foot on 4 inch step 10x2 with 5 second holds   Posterior tilt of L pelvis palpated  Seated B scapular retraction 10x3 with 5  seconds to promote thoracic extension and decrease low back extension pressure.    Improved exercise technique, movement at target joints, use of target muscles after mod verbal, visual, tactile cues.     Response to treatment Pt tolerated session well without aggravation of symptoms. Improved B hip IR observed with treatment.    Clinical impression  Worked on improving bilateral hip IR mobility, decreasing rectus femoris tightness, improving L glute max muscle strength, and promoting thoracic extension to help decrease stress to low back with activities. Pt tolerated session well without aggravation of symptoms. Pt will benefit from continued skilled physical therapy services to decrease pain, improve ROM, strength and function.         PT Short Term Goals - 09/07/19 1136      PT SHORT TERM GOAL #1   Title  Pt will be independent with his HEP to decrease pain, improve strength and function.    Time  3    Period  Weeks    Status  New    Target Date  10/20/19        PT Long Term Goals - 09/07/19 1137      PT LONG TERM GOAL #1   Title  Patient will have a decrease in low back pain to 2/10 or less at worst to promote ability to lift items as well as perform standing tasks more comfortably.    Baseline  5/10 low back pain at most for the past 3 months (09/07/2019)    Period  Weeks    Status  New    Target Date  10/20/19      PT LONG TERM GOAL #2   Title  Patient will improve bilateral hip extension and abduction strength by at least 1/2 MMT grade to promote ability to perform standing tasks more comfortably.    Baseline  hip extension 4-/5 R, and L; hip abduction 4/5 R, and L (09/07/2019)    Time  6    Period  Weeks    Status  New    Target Date  10/20/19      PT LONG TERM GOAL #3   Title  Pt will improve lumbar FOTO score by at least 10 points as a demonstration of improved function.    Baseline  lumbar FOTO 58 (09/07/2019)    Time  6  Period  Weeks    Status  New     Target Date  10/20/19      PT LONG TERM GOAL #4   Title  Pt will improve  6 minute walk test distance to 1328 ft or more to promote ability to ambulate further.    Baseline  1228 ft with 1/10 central low back pain (09/07/2019)    Time  6    Period  Weeks    Status  New    Target Date  10/20/19            Plan - 09/22/19 1834    Clinical Impression Statement  Worked on improving bilateral hip IR mobility, decreasing rectus femoris tightness, improving L glute max muscle strength, and promoting thoracic extension to help decrease stress to low back with activities. Pt tolerated session well without aggravation of symptoms. Pt will benefit from continued skilled physical therapy services to decrease pain, improve ROM, strength and function.    Personal Factors and Comorbidities  Age;Comorbidity 3+;Past/Current Experience;Time since onset of injury/illness/exacerbation    Comorbidities  DM, HTN, Parkinson's disease    Examination-Activity Limitations  Lift;Carry;Stand    Stability/Clinical Decision Making  Evolving/Moderate complexity   Pain seems to be worsening based on subjective reports   Rehab Potential  Fair    PT Frequency  2x / week    PT Duration  6 weeks    PT Treatment/Interventions  Therapeutic activities;Therapeutic exercise;Neuromuscular re-education;Patient/family education;Manual techniques;Dry needling;Aquatic Therapy;Electrical Stimulation;Iontophoresis 4mg /ml Dexamethasone    Consulted and Agree with Plan of Care  Patient       Patient will benefit from skilled therapeutic intervention in order to improve the following deficits and impairments:  Pain, Postural dysfunction, Improper body mechanics, Decreased strength  Visit Diagnosis: Chronic left-sided low back pain without sciatica  Muscle weakness (generalized)     Problem List Patient Active Problem List   Diagnosis Date Noted  . Low back pain of over 3 months duration 08/19/2019  . Pulmonary  hypertension ([place])   . Insomnia 12/21/2018  . Sleep apnea in adult 12/09/2018  . Periodic limb movement disorder 12/09/2018  . Abnormal findings on diagnostic imaging of lung 07/09/2018  . Lung nodule 07/09/2018  . Pulmonary nodules 05/18/2018  . Wears hearing aid in both ears 05/17/2018  . Leg pain, bilateral 02/20/2018  . Dyspnea   . Effort angina ([facility])   . CKD (chronic kidney disease) stage 3, GFR 30-59 ml/min 09/21/2017  . History of skin cancer in adulthood 08/14/2016  . Parkinson's disease ([place]) 08/09/2016  . Bilateral carotid artery stenosis 04/02/2015  . Vertigo, peripheral 10/17/2014  . Benign prostatic hypertrophy with urinary frequency 01/31/2014  . Encounter for Medicare annual wellness exam 07/02/2013  . Obesity 04/03/2013  . Other malaise and fatigue 09/21/2012  . Hyperlipidemia   . Hypertension   . 3-vessel coronary artery disease   . S/P CABG x 5   . 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, [place], [address] Phone: [phone]   Fax:  [phone]  Name: Devean Hempel. MRN: [id number] Date of Birth: [birth year]

## 2019-09-27 ENCOUNTER — Ambulatory Visit: Payer: Medicare Other

## 2019-10-05 ENCOUNTER — Ambulatory Visit: Payer: Medicare Other

## 2019-10-05 ENCOUNTER — Other Ambulatory Visit: Payer: Self-pay

## 2019-10-05 DIAGNOSIS — M6281 Muscle weakness (generalized): Secondary | ICD-10-CM

## 2019-10-05 DIAGNOSIS — M545 Low back pain: Secondary | ICD-10-CM | POA: Diagnosis not present

## 2019-10-05 DIAGNOSIS — G8929 Other chronic pain: Secondary | ICD-10-CM | POA: Diagnosis not present

## 2019-10-05 NOTE — Therapy (Signed)
[facility] [address], [place], [address] Phone: [phone]   Fax:  [phone]  Physical Therapy Treatment  Patient Details  Name: Johnathan Arnold. MRN: [id number] Date of Birth: [birth year] Referring Provider (PT): Deborra Medina, MD   Encounter Date: 10/05/2019  PT End of Session - 10/05/19 1401    Visit Number  3    Number of Visits  13    Date for PT Re-Evaluation  10/20/19    Authorization Type  3    Authorization Time Period  of 10 progress report    PT Start Time  1100    PT Stop Time  1145    PT Time Calculation (min)  45 min    Activity Tolerance  Patient tolerated treatment well    Behavior During Therapy  WFL for tasks assessed/performed       Past Medical History:  Diagnosis Date  . 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 Diabetes mellitus without complication ([place])   . History of cardiac catheterization 2011   [facility]  . Hyperlipidemia   . Hypertension   . Hypertriglyceridemia   . Parkinson's disease ([place])   . 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 Vertigo     Past Surgical History:  Procedure Laterality Date  . CARDIAC CATHETERIZATION  05-19-2010   [facility]: Patent grafts. LIMA to LAD, SVG to D1, OM1 and RPDA  . CORONARY ARTERY BYPASS GRAFT  03/1998   5 vessel, [facility]  . RIGHT HEART CATH N/A 04/04/2019   Procedure: RIGHT HEART CATH;  Surgeon: Wellington Hampshire, MD;  Location: [place] [facility];  Service: Cardiovascular;  Laterality: N/A;  . RIGHT/LEFT HEART CATH AND CORONARY ANGIOGRAPHY N/A 02/01/2018   Procedure: RIGHT/LEFT HEART CATH AND CORONARY ANGIOGRAPHY;  Surgeon: Wellington Hampshire, MD;  Location: [place] [facility];  Service: Cardiovascular;  Laterality: N/A;    There were no vitals filed for this visit.  Subjective Assessment - 10/05/19 1104    Subjective  Patient reported that his back is hurting today, but it hasn't hurt as much. Reported the first  session really got rid of a lot of the pain.    Pertinent History  Chronic L low back pain without sciatica. Has a lot of weakness and soreness B LE. Had a back x-ray which revealed a bulging disc. 5-10 years ago, pt fracured lower back and ever since then his back would hurt when he is standing. Went to a Restaurant manager, fast food which helped a lot back then. However, nowadays, his back would hurt again the next day. Denies loss of bowel or bladder control. Denies LE paresthesia. No falls within the past 6 months. No fear of falling. Next follow up appointment with Dr. Derrel Nip is in maybe 6 months.  Goes to heart track 3 days a week to work out on some of the machines there.  Pt adds that he has Parkinson's. Currently participates in [employer] for his Parkinson's as recommended by his doctor.  Feels like he has not strength in his legs since 3-4 years ago. Has been checked by his cardiologist. Also talked to his neurologist (Dr. Carles Collet) about it who did not say anything.    Patient Stated Goals  Get rid of the pain. See if PT can help with his leg soress and weakness.    Currently in Pain?  Yes    Pain Score  4  Pain Location  Back    Pain Orientation  Left;Lower    Pain Descriptors / Indicators  --   "hurts"   Pain Type  Chronic pain    Pain Onset  More than a month ago       Objectives   Medbridge  Access Code [email or web address]   Therapeutic exercise   Supine manual hip IR stretch with PT             L 3x 1 min ea hold             R 3x 1 min ea hold   Supine with hip in 90/90             Hip IR PROM                         L 10x3                         R 10x3   S/L rectus femoris stretch              L 20 seconds x 2             R 20 seconds x 2   Seated hip AROM IR             R 10x3             L 10x3   prone flexor stretch with PT  3 x with 1 min hold BLE  Prone scapular retraction 2x10 with tactile cues 2x10  Prone propping with arms underneath shoulders (forearms on table)x2 min  hold, pt reported L sided low back pain discomfort with minimal prone pressup   Seated bilateral hip extension isometrics with foot on step, bilaterally10x5 seconds  Improved exercise technique, movement at target joints, use of target muscles after mod verbal, visual, tactile cues.     Response to treatment/clinical impression: The patient reported 0/10 pan at end of session. The patient needed min-mod verbal cues for exercises and proper technique, exhibited decreased hip ROM bilaterally as well as significant limitation in bilateral hip flexors. The patient also had intermittent twitches of BLE, pt stated it was nearing the time he normally takes his Parkinson's medication. The patient would benefit from continued skilled PT intervention to address functional limitations and pain management.      PT Education - 10/05/19 1400    Education Details  therex    Person(s) Educated  Patient    Methods  Demonstration;Explanation;Tactile cues;Verbal cues    Comprehension  Verbalized understanding;Returned demonstration;Tactile cues required;Need further instruction       PT Short Term Goals - 09/07/19 1136      PT SHORT TERM GOAL #1   Title  Pt will be independent with his HEP to decrease pain, improve strength and function.    Time  3    Period  Weeks    Status  New    Target Date  10/20/19        PT Long Term Goals - 09/07/19 1137      PT LONG TERM GOAL #1   Title  Patient will have a decrease in low back pain to 2/10 or less at worst to promote ability to lift items as well as perform standing tasks more comfortably.    Baseline  5/10 low back pain at most for the past 3 months (09/07/2019)    Period  Weeks    Status  New    Target Date  10/20/19      PT LONG TERM GOAL #2   Title  Patient will improve bilateral hip extension and abduction strength by at least 1/2 MMT grade to promote ability to perform standing tasks more comfortably.    Baseline  hip extension 4-/5 R, and L;  hip abduction 4/5 R, and L (09/07/2019)    Time  6    Period  Weeks    Status  New    Target Date  10/20/19      PT LONG TERM GOAL #3   Title  Pt will improve lumbar FOTO score by at least 10 points as a demonstration of improved function.    Baseline  lumbar FOTO 58 (09/07/2019)    Time  6    Period  Weeks    Status  New    Target Date  10/20/19      PT LONG TERM GOAL #4   Title  Pt will improve  6 minute walk test distance to 1328 ft or more to promote ability to ambulate further.    Baseline  1228 ft with 1/10 central low back pain (09/07/2019)    Time  6    Period  Weeks    Status  New    Target Date  10/20/19            Plan - 10/05/19 1400    Personal Factors and Comorbidities  Age;Comorbidity 3+;Past/Current Experience;Time since onset of injury/illness/exacerbation    Comorbidities  DM, HTN, Parkinson's disease    Examination-Activity Limitations  Lift;Carry;Stand    Stability/Clinical Decision Making  Evolving/Moderate complexity   pain seems to be worsening per pt subjective reports   Rehab Potential  Fair    PT Frequency  2x / week    PT Duration  6 weeks    PT Treatment/Interventions  Therapeutic activities;Therapeutic exercise;Neuromuscular re-education;Patient/family education;Manual techniques;Dry needling;Aquatic Therapy;Electrical Stimulation;Iontophoresis 4mg /ml Dexamethasone    Consulted and Agree with Plan of Care  Patient       Patient will benefit from skilled therapeutic intervention in order to improve the following deficits and impairments:  Pain, Postural dysfunction, Improper body mechanics, Decreased strength  Visit Diagnosis: Chronic left-sided low back pain without sciatica  Muscle weakness (generalized)     Problem List Patient Active Problem List   Diagnosis Date Noted  . Low back pain of over 3 months duration 08/19/2019  . Pulmonary hypertension ([place])   . Insomnia 12/21/2018  . Sleep apnea in adult 12/09/2018  . Periodic limb  movement disorder 12/09/2018  . Abnormal findings on diagnostic imaging of lung 07/09/2018  . Lung nodule 07/09/2018  . Pulmonary nodules 05/18/2018  . Wears hearing aid in both ears 05/17/2018  . Leg pain, bilateral 02/20/2018  . Dyspnea   . Effort angina ([facility])   . CKD (chronic kidney disease) stage 3, GFR 30-59 ml/min 09/21/2017  . History of skin cancer in adulthood 08/14/2016  . Parkinson's disease ([place]) 08/09/2016  . Bilateral carotid artery stenosis 04/02/2015  . Vertigo, peripheral 10/17/2014  . Benign prostatic hypertrophy with urinary frequency 01/31/2014  . Encounter for Medicare annual wellness exam 07/02/2013  . Obesity 04/03/2013  . Other malaise and fatigue 09/21/2012  . Hyperlipidemia   . Hypertension   . 3-vessel coronary artery disease   . S/P CABG x 5   . 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 [place], [address] Phone: [phone]   Fax:  [phone]  Name: Johnathan Arnold. MRN: [id number] Date of Birth: Aug 30, 1945

## 2019-10-09 ENCOUNTER — Other Ambulatory Visit: Payer: Self-pay | Admitting: Internal Medicine

## 2019-10-09 ENCOUNTER — Other Ambulatory Visit: Payer: Self-pay | Admitting: Cardiovascular Disease

## 2019-10-10 ENCOUNTER — Ambulatory Visit: Payer: Medicare Other | Attending: Internal Medicine

## 2019-10-10 ENCOUNTER — Telehealth: Payer: Self-pay

## 2019-10-10 ENCOUNTER — Other Ambulatory Visit: Payer: Self-pay

## 2019-10-10 ENCOUNTER — Other Ambulatory Visit: Payer: Self-pay | Admitting: Neurology

## 2019-10-10 DIAGNOSIS — M545 Low back pain: Secondary | ICD-10-CM | POA: Insufficient documentation

## 2019-10-10 DIAGNOSIS — M79604 Pain in right leg: Secondary | ICD-10-CM

## 2019-10-10 DIAGNOSIS — M6281 Muscle weakness (generalized): Secondary | ICD-10-CM

## 2019-10-10 DIAGNOSIS — G8929 Other chronic pain: Secondary | ICD-10-CM | POA: Diagnosis not present

## 2019-10-10 DIAGNOSIS — M79605 Pain in left leg: Secondary | ICD-10-CM

## 2019-10-10 NOTE — Telephone Encounter (Signed)
Orders place in Epic. Message fwd to scheduling to contact the patient to schedule.

## 2019-10-10 NOTE — Therapy (Signed)
[facility] [address] [place], [address] Phone: [phone]   Fax:  [phone]  Physical Therapy Treatment  Patient Details  Name: Johnathan Arnold. MRN: [id number] Date of Birth: November 28, 1945 Referring Provider (PT): Deborra Medina, MD   Encounter Date: 10/10/2019  PT End of Session - 10/10/19 0928    Visit Number  4    Number of Visits  13    Date for PT Re-Evaluation  10/20/19    Authorization Type  4    Authorization Time Period  of 10 progress report    PT Start Time  0928    PT Stop Time  1034    PT Time Calculation (min)  66 min    Activity Tolerance  Patient tolerated treatment well    Behavior During Therapy  [facility] for tasks assessed/performed       Past Medical History:  Diagnosis Date  . 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 Diabetes mellitus without complication ([place])   . History of cardiac catheterization 2011   [facility]  . Hyperlipidemia   . Hypertension   . Hypertriglyceridemia   . Parkinson's disease ([place])   . 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 Vertigo     Past Surgical History:  Procedure Laterality Date  . CARDIAC CATHETERIZATION  05-19-2010   [facility]: Patent grafts. LIMA to LAD, SVG to D1, OM1 and RPDA  . CORONARY ARTERY BYPASS GRAFT  03/1998   5 vessel, [facility]  . RIGHT HEART CATH N/A 04/04/2019   Procedure: RIGHT HEART CATH;  Surgeon: Wellington Hampshire, MD;  Location: [place] [facility];  Service: Cardiovascular;  Laterality: N/A;  . RIGHT/LEFT HEART CATH AND CORONARY ANGIOGRAPHY N/A 02/01/2018   Procedure: RIGHT/LEFT HEART CATH AND CORONARY ANGIOGRAPHY;  Surgeon: Wellington Hampshire, MD;  Location: [place] [facility];  Service: Cardiovascular;  Laterality: N/A;    There were no vitals filed for this visit.  Subjective Assessment - 10/10/19 0929    Subjective  L low back started back to hurting. The low back did not really bother him for quite a while after the  first follow up visit. Low back pain relief did not last as long after the last session. No pain currently. Has not been doing his seated hip IR exercise since last Friday.    Pertinent History  Chronic L low back pain without sciatica. Has a lot of weakness and soreness B LE. Had a back x-ray which revealed a bulging disc. 5-10 years ago, pt fracured lower back and ever since then his back would hurt when he is standing. Went to a Restaurant manager, fast food which helped a lot back then. However, nowadays, his back would hurt again the next day. Denies loss of bowel or bladder control. Denies LE paresthesia. No falls within the past 6 months. No fear of falling. Next follow up appointment with Dr. Derrel Nip is in maybe 6 months.  Goes to heart track 3 days a week to work out on some of the machines there.  Pt adds that he has Parkinson's. Currently participates in [employer] for his Parkinson's as recommended by his doctor.  Feels like he has not strength in his legs since 3-4 years ago. Has been checked by his cardiologist. Also talked to his neurologist (Dr. Carles Collet) about it who did not say anything.    Patient Stated Goals  Get rid of the pain.  See if PT can help with his leg soress and weakness.    Currently in Pain?  No/denies    Pain Score  0-No pain    Pain Onset  More than a month ago                               PT Education - 10/10/19 0948    Education Details  ther-ex    Person(s) Educated  Patient    Methods  Explanation;Demonstration;Tactile cues;Verbal cues    Comprehension  Returned demonstration;Verbalized understanding        Objectives  MedbridgeAccess Code [email or web address]  Walking about 30 seconds bothers his anterior thigh muscles, during inclines  Therapeutic exercise   Supine manual hip IR stretch with PT             L 2x             R 2x   Improved hip IR overall observed   Supine with hip in 90/90             Hip IR PROM                          L 10x3                         R 10x3  S/L rectus femoris stretch              L 20 seconds x 5             R 20 seconds x 5   Seated hip IR             R 10x3             L 10x3   Pt was recommended to continue performing this exercise at home to continue promoting hip IR. Pt verbalized understanding.    Standing R lateral shift correction 10x5 seconds for 3 sets  Gait around gym x 3 minutes.   B gastroc symptoms around 1:40 min. Eases with glute max squeeze   Then with glute squeeze and abdominal muscle activation. L quad symptoms towards 3rd minute with fatigue and weakness  Blood pressure L arm sitting, 198/65, HR 59  No dizziness, blurred vision, headaches. No slurred speech or weakness observed.   Manually taken: 200/65 L arm sitting   Pt was recommended to get his blood pressure checked regularly. Pt verbalized understanding.   Pt took his blood pressure medication about 8:30 am.   After a few minutes of rest. No feeling out of breath, pt states feeling sort of back to normal   Blood pressure L arm sitting, 154/51, HR 47 (pt states that his heart rate is always slow, cardiac doctor aware)     Improved exercise technique, movement at target joints, use of target muscles after mod verbal, visual, tactile cues.     Response to treatment Pt tolerated session well without aggravation of symptoms. Improved B hip IR observed with treatment.      Clinical impression  Continued working on improving B hip IR, hip, extension, rectus femoris mobility, as well as decreasing lateral shift to decrease stress to low back. No complain of pain throughout session. Pt demonstrates elevated systolic numbers towards end of session with fatigue. Decreases with rest. Pt was recommended to monitor his vitals secondary to elevated numbers. Pt  verbalized understanding.  Pt will benefit from continued skilled physical therapy services to decrease pain, improve ROM, strength, and  function.     PT Short Term Goals - 09/07/19 1136      PT SHORT TERM GOAL #1   Title  Pt will be independent with his HEP to decrease pain, improve strength and function.    Time  3    Period  Weeks    Status  New    Target Date  10/20/19        PT Long Term Goals - 09/07/19 1137      PT LONG TERM GOAL #1   Title  Patient will have a decrease in low back pain to 2/10 or less at worst to promote ability to lift items as well as perform standing tasks more comfortably.    Baseline  5/10 low back pain at most for the past 3 months (09/07/2019)    Period  Weeks    Status  New    Target Date  10/20/19      PT LONG TERM GOAL #2   Title  Patient will improve bilateral hip extension and abduction strength by at least 1/2 MMT grade to promote ability to perform standing tasks more comfortably.    Baseline  hip extension 4-/5 R, and L; hip abduction 4/5 R, and L (09/07/2019)    Time  6    Period  Weeks    Status  New    Target Date  10/20/19      PT LONG TERM GOAL #3   Title  Pt will improve lumbar FOTO score by at least 10 points as a demonstration of improved function.    Baseline  lumbar FOTO 58 (09/07/2019)    Time  6    Period  Weeks    Status  New    Target Date  10/20/19      PT LONG TERM GOAL #4   Title  Pt will improve  6 minute walk test distance to 1328 ft or more to promote ability to ambulate further.    Baseline  1228 ft with 1/10 central low back pain (09/07/2019)    Time  6    Period  Weeks    Status  New    Target Date  10/20/19            Plan - 10/10/19 [id number]    Clinical Impression Statement  Continued working on improving B hip IR, hip, extension, rectus femoris mobility, as well as decreasing lateral shift to decrease stress to low back. No complain of pain throughout session. Pt demonstrates elevated systolic numbers towards end of session with fatigue. Decreases with rest. Pt was recommended to monitor his vitals secondary to elevated numbers. Pt  verbalized understanding.  Pt will benefit from continued skilled physical therapy services to decrease pain, improve ROM, strength, and function.    Personal Factors and Comorbidities  Age;Comorbidity 3+;Past/Current Experience;Time since onset of injury/illness/exacerbation    Comorbidities  DM, HTN, Parkinson's disease    Examination-Activity Limitations  Lift;Carry;Stand    Stability/Clinical Decision Making  Evolving/Moderate complexity   pain seems to be worsening per pt subjective reports   Rehab Potential  Fair    PT Frequency  2x / week    PT Duration  6 weeks    PT Treatment/Interventions  Therapeutic activities;Therapeutic exercise;Neuromuscular re-education;Patient/family education;Manual techniques;Dry needling;Aquatic Therapy;Electrical Stimulation;Iontophoresis 4mg /ml Dexamethasone    Consulted and Agree with Plan of Care  Patient  Patient will benefit from skilled therapeutic intervention in order to improve the following deficits and impairments:  Pain, Postural dysfunction, Improper body mechanics, Decreased strength  Visit Diagnosis: Chronic left-sided low back pain without sciatica  Muscle weakness (generalized)     Problem List Patient Active Problem List   Diagnosis Date Noted  . Low back pain of over 3 months duration 08/19/2019  . Pulmonary hypertension ([place])   . Insomnia 12/21/2018  . Sleep apnea in adult 12/09/2018  . Periodic limb movement disorder 12/09/2018  . Abnormal findings on diagnostic imaging of lung 07/09/2018  . Lung nodule 07/09/2018  . Pulmonary nodules 05/18/2018  . Wears hearing aid in both ears 05/17/2018  . Leg pain, bilateral 02/20/2018  . Dyspnea   . Effort angina ([facility])   . CKD (chronic kidney disease) stage 3, GFR 30-59 ml/min 09/21/2017  . History of skin cancer in adulthood 08/14/2016  . Parkinson's disease ([place]) 08/09/2016  . Bilateral carotid artery stenosis 04/02/2015  . Vertigo, peripheral 10/17/2014  . Benign  prostatic hypertrophy with urinary frequency 01/31/2014  . Encounter for Medicare annual wellness exam 07/02/2013  . Obesity 04/03/2013  . Other malaise and fatigue 09/21/2012  . Hyperlipidemia   . Hypertension   . 3-vessel coronary artery disease   . S/P CABG x 5   . 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 Angina pectoris associated with type 2 diabetes mellitus ([place]) 05/21/2011    Joneen Boers PT, DPT   10/10/2019, 12:51 PM  [facility] [address], [place], [address] Phone: [phone]   Fax:  [phone]  Name: Johnathan Arnold. MRN: [id number] Date of Birth: 18-Dec-1945

## 2019-10-10 NOTE — Telephone Encounter (Signed)
-----   Message from Wellington Hampshire, MD sent at 10/09/2019 11:59 AM EDT ----- Arloa Koh go ahead and order an ABI and aortoiliac duplex on him.  A treadmill ABI can be considered after that if we do not get the answers we are looking for.

## 2019-10-10 NOTE — Telephone Encounter (Signed)
Attempted to schedule.  LMOV to call office.  ° °

## 2019-10-11 NOTE — Telephone Encounter (Signed)
LE testing is scheduled for 11/16/19. Closing this encounter.

## 2019-10-12 ENCOUNTER — Ambulatory Visit: Payer: Medicare Other

## 2019-10-12 ENCOUNTER — Other Ambulatory Visit: Payer: Self-pay

## 2019-10-12 DIAGNOSIS — M545 Low back pain, unspecified: Secondary | ICD-10-CM

## 2019-10-12 DIAGNOSIS — G8929 Other chronic pain: Secondary | ICD-10-CM

## 2019-10-12 DIAGNOSIS — M6281 Muscle weakness (generalized): Secondary | ICD-10-CM

## 2019-10-12 NOTE — Therapy (Signed)
[place] [facility] [address] [place], [address] Phone: [phone]   Fax:  [phone]  Physical Therapy Treatment  Patient Details  Name: Johnathan Arnold. MRN: [id number] Date of Birth: 07/24/1945 Referring Provider (PT): Deborra Medina, MD   Encounter Date: 10/12/2019  PT End of Session - 10/12/19 0935    Visit Number  5    Number of Visits  13    Date for PT Re-Evaluation  10/20/19    Authorization Type  5    Authorization Time Period  of 10 progress report    PT Start Time  0935    PT Stop Time  1016    PT Time Calculation (min)  41 min    Activity Tolerance  Patient tolerated treatment well    Behavior During Therapy  [facility] for tasks assessed/performed       Past Medical History:  Diagnosis Date  . 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 Diabetes mellitus without complication ([place])   . History of cardiac catheterization 2011   [facility]  . Hyperlipidemia   . Hypertension   . Hypertriglyceridemia   . Parkinson's disease ([place])   . 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 Vertigo     Past Surgical History:  Procedure Laterality Date  . CARDIAC CATHETERIZATION  05-19-2010   [facility]: Patent grafts. LIMA to LAD, SVG to D1, OM1 and RPDA  . CORONARY ARTERY BYPASS GRAFT  03/1998   5 vessel, [facility]  . RIGHT HEART CATH N/A 04/04/2019   Procedure: RIGHT HEART CATH;  Surgeon: Wellington Hampshire, MD;  Location: [place] [facility];  Service: Cardiovascular;  Laterality: N/A;  . RIGHT/LEFT HEART CATH AND CORONARY ANGIOGRAPHY N/A 02/01/2018   Procedure: RIGHT/LEFT HEART CATH AND CORONARY ANGIOGRAPHY;  Surgeon: Wellington Hampshire, MD;  Location: [place] [facility];  Service: Cardiovascular;  Laterality: N/A;    There were no vitals filed for this visit.  Subjective Assessment - 10/12/19 0936    Subjective  Low back feels good right now. 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 felt like low back compression pressure. Sat down for a few minutes and it goes away. Felt good after Monday. Back did not bother him until he did the yard work yesterday afternoon.    Pertinent History  Chronic L low back pain without sciatica. Has a lot of weakness and soreness B LE. Had a back x-ray which revealed a bulging disc. 5-10 years ago, pt fracured lower back and ever since then his back would hurt when he is standing. Went to a Restaurant manager, fast food which helped a lot back then. However, nowadays, his back would hurt again the next day. Denies loss of bowel or bladder control. Denies LE paresthesia. No falls within the past 6 months. No fear of falling. Next follow up appointment with Dr. Derrel Nip is in maybe 6 months.  Goes to heart track 3 days a week to work out on some of the machines there.  Pt adds that he has Parkinson's. Currently participates in [employer] for his Parkinson's as recommended by his doctor.  Feels like he has not strength in his legs since 3-4 years ago. Has been checked by his cardiologist. Also talked to his neurologist (Dr. Carles Collet) about it who did not say anything.  Patient Stated Goals  Get rid of the pain. See if PT can help with his leg soress and weakness.    Currently in Pain?  No/denies    Pain Score  0-No pain    Pain Onset  More than a month ago                               PT Education - 10/12/19 0943    Education Details  ther-ex    Person(s) Educated  Patient    Methods  Explanation;Demonstration;Tactile cues;Verbal cues    Comprehension  Returned demonstration;Verbalized understanding      Objectives  MedbridgeAccess Code [email or web address]  Walking about 30 seconds bothers his anterior thigh muscles, during inclines  Therapeutic exercise  Blood pressure L arm sitting, mechanically taken, normal cuff: 147/53, HR 46  Supine manual hip IR stretch with  PT L 2x R 2x     Supine with hip in 90/90 Hip IR PROM L 10x3 R 10x3  S/L rectus femoris stretch  L 20 seconds x 5 R 20 seconds x 5   Seated hip IR R 10x3 L 10x3  Seated L hip extension isometrics 10x5 seconds for 3 sets with L foot on 4 inch step              Posterior tilt of L pelvis palpated  After aforementioned exercises: Blood pressure L arm sitting, mechanically taken, normal cuff: 159/56, HE 45     Improved exercise technique, movement at target joints, use of target muscles after mod verbal, visual, tactile cues.     Response to treatment Pt tolerated session well without complain of pain    Clinical impression  Continued working on improving B hip IR, hip, extension, rectus femoris mobility, and improving L glute max strength to decrease stress to low back. No complain of pain throughout session., Pt will benefit from continued skilled physical therapy services to improve hip mobility, strength, and function      PT Short Term Goals - 09/07/19 1136      PT SHORT TERM GOAL #1   Title  Pt will be independent with his HEP to decrease pain, improve strength and function.    Time  3    Period  Weeks    Status  New    Target Date  10/20/19        PT Long Term Goals - 09/07/19 1137      PT LONG TERM GOAL #1   Title  Patient will have a decrease in low back pain to 2/10 or less at worst to promote ability to lift items as well as perform standing tasks more comfortably.    Baseline  5/10 low back pain at most for the past 3 months (09/07/2019)    Period  Weeks    Status  New    Target Date  10/20/19      PT LONG TERM GOAL #2   Title  Patient will improve bilateral hip extension and abduction strength by at least 1/2 MMT grade to promote ability to perform standing tasks more comfortably.    Baseline  hip  extension 4-/5 R, and L; hip abduction 4/5 R, and L (09/07/2019)    Time  6    Period  Weeks    Status  New    Target Date  10/20/19      PT LONG TERM GOAL #3   Title  Pt  will improve lumbar FOTO score by at least 10 points as a demonstration of improved function.    Baseline  lumbar FOTO 58 (09/07/2019)    Time  6    Period  Weeks    Status  New    Target Date  10/20/19      PT LONG TERM GOAL #4   Title  Pt will improve  6 minute walk test distance to 1328 ft or more to promote ability to ambulate further.    Baseline  1228 ft with 1/10 central low back pain (09/07/2019)    Time  6    Period  Weeks    Status  New    Target Date  10/20/19            Plan - 10/12/19 0957    Clinical Impression Statement  Continued working on improving B hip IR, hip, extension, rectus femoris mobility, and improving L glute max strength to decrease stress to low back. No complain of pain throughout session., Pt will benefit from continued skilled physical therapy services to improve hip mobility, strength, and function    Personal Factors and Comorbidities  Age;Comorbidity 3+;Past/Current Experience;Time since onset of injury/illness/exacerbation    Comorbidities  DM, HTN, Parkinson's disease    Examination-Activity Limitations  Lift;Carry;Stand    Stability/Clinical Decision Making  Evolving/Moderate complexity   pain seems to be worsening per pt subjective reports   Rehab Potential  Fair    PT Frequency  2x / week    PT Duration  6 weeks    PT Treatment/Interventions  Therapeutic activities;Therapeutic exercise;Neuromuscular re-education;Patient/family education;Manual techniques;Dry needling;Aquatic Therapy;Electrical Stimulation;Iontophoresis 4mg /ml Dexamethasone    Consulted and Agree with Plan of Care  Patient       Patient will benefit from skilled therapeutic intervention in order to improve the following deficits and impairments:  Pain, Postural dysfunction, Improper body mechanics,  Decreased strength  Visit Diagnosis: Chronic left-sided low back pain without sciatica  Muscle weakness (generalized)     Problem List Patient Active Problem List   Diagnosis Date Noted  . Low back pain of over 3 months duration 08/19/2019  . Pulmonary hypertension ([place])   . Insomnia 12/21/2018  . Sleep apnea in adult 12/09/2018  . Periodic limb movement disorder 12/09/2018  . Abnormal findings on diagnostic imaging of lung 07/09/2018  . Lung nodule 07/09/2018  . Pulmonary nodules 05/18/2018  . Wears hearing aid in both ears 05/17/2018  . Leg pain, bilateral 02/20/2018  . Dyspnea   . Effort angina (HCC)   . CKD (chronic kidney disease) stage 3, GFR 30-59 ml/min 09/21/2017  . History of skin cancer in adulthood 08/14/2016  . Parkinson's disease ([place]) 08/09/2016  . Bilateral carotid artery stenosis 04/02/2015  . Vertigo, peripheral 10/17/2014  . Benign prostatic hypertrophy with urinary frequency 01/31/2014  . Encounter for Medicare annual wellness exam 07/02/2013  . Obesity 04/03/2013  . Other malaise and fatigue 09/21/2012  . Hyperlipidemia   . Hypertension   . 3-vessel coronary artery disease   . S/P CABG x 5   . 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 [facility] [address], [place], [address] Phone: [phone]   Fax:  [phone]  Name: Johnathan Arnold. MRN: [id number] Date of Birth: [birth year]

## 2019-10-16 ENCOUNTER — Other Ambulatory Visit: Payer: Self-pay | Admitting: Cardiovascular Disease

## 2019-10-17 ENCOUNTER — Ambulatory Visit: Payer: Medicare Other

## 2019-10-17 ENCOUNTER — Other Ambulatory Visit: Payer: Self-pay

## 2019-10-17 DIAGNOSIS — M6281 Muscle weakness (generalized): Secondary | ICD-10-CM

## 2019-10-17 DIAGNOSIS — G8929 Other chronic pain: Secondary | ICD-10-CM

## 2019-10-17 DIAGNOSIS — M545 Low back pain, unspecified: Secondary | ICD-10-CM

## 2019-10-17 NOTE — Therapy (Signed)
[place] [facility] [address] [place], [address] Phone: [phone]   Fax:  [phone]  Physical Therapy Treatment  Patient Details  Name: Johnathan Arnold. MRN: [id number] Date of Birth: 09-Apr-1946 Referring Provider (PT): Deborra Medina, MD   Encounter Date: 10/17/2019  PT End of Session - 10/17/19 0803    Visit Number  6    Number of Visits  13    Date for PT Re-Evaluation  10/20/19    Authorization Type  6    Authorization Time Period  of 10 progress report    PT Start Time  0803    PT Stop Time  0848    PT Time Calculation (min)  45 min    Activity Tolerance  Patient tolerated treatment well    Behavior During Therapy  [facility] for tasks assessed/performed       Past Medical History:  Diagnosis Date  . 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 Diabetes mellitus without complication ([place])   . History of cardiac catheterization 2011   [facility]  . Hyperlipidemia   . Hypertension   . Hypertriglyceridemia   . Parkinson's disease ([place])   . 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 Vertigo     Past Surgical History:  Procedure Laterality Date  . CARDIAC CATHETERIZATION  05-19-2010   [facility]: Patent grafts. LIMA to LAD, SVG to D1, OM1 and RPDA  . CORONARY ARTERY BYPASS GRAFT  03/1998   5 vessel, [facility]  . RIGHT HEART CATH N/A 04/04/2019   Procedure: RIGHT HEART CATH;  Surgeon: Wellington Hampshire, MD;  Location: [place] [facility];  Service: Cardiovascular;  Laterality: N/A;  . RIGHT/LEFT HEART CATH AND CORONARY ANGIOGRAPHY N/A 02/01/2018   Procedure: RIGHT/LEFT HEART CATH AND CORONARY ANGIOGRAPHY;  Surgeon: Wellington Hampshire, MD;  Location: [place] [facility];  Service: Cardiovascular;  Laterality: N/A;    There were no vitals filed for this visit.  Subjective Assessment - 10/17/19 0807    Subjective  Back is pretty good. Has a little soreness in the R groin area with the seated R hip IR exercise but  that's it. No back pain currently. 2-3/10 back pain at most for the past 7 days.  Lifting is the same. Carrying the weed sprayer (1 gallon) bothered his L low back.  Usually carries it on his L side.    Pertinent History  Chronic L low back pain without sciatica. Has a lot of weakness and soreness B LE. Had a back x-ray which revealed a bulging disc. 5-10 years ago, pt fracured lower back and ever since then his back would hurt when he is standing. Went to a Restaurant manager, fast food which helped a lot back then. However, nowadays, his back would hurt again the next day. Denies loss of bowel or bladder control. Denies LE paresthesia. No falls within the past 6 months. No fear of falling. Next follow up appointment with Dr. Derrel Nip is in maybe 6 months.  Goes to heart track 3 days a week to work out on some of the machines there.  Pt adds that he has Parkinson's. Currently participates in [employer] for his Parkinson's as recommended by his doctor.  Feels like he has not strength in his legs since 3-4 years ago. Has been checked by his cardiologist. Also talked to his neurologist (Dr. Carles Collet) about it who did not say anything.    Patient  Stated Goals  Get rid of the pain. See if PT can help with his leg soress and weakness.    Currently in Pain?  No/denies    Pain Score  0-No pain    Pain Onset  More than a month ago                               PT Education - 10/17/19 0805    Education Details  ther-ex    Person(s) Educated  Patient    Methods  Explanation;Demonstration;Tactile cues;Verbal cues    Comprehension  Returned demonstration;Verbalized understanding         Objectives  MedbridgeAccess Code [email or web address]   Hx of old mild L1 compression deformity fx based on radiograph 02/17/18  Walking about 30 seconds bothers his anterior thigh muscles, during inclines  Posture: R lateral shift, R lumbar rotation   Therapeutic exercise  Blood pressure L arm sitting,  mechanically taken, normal cuff: 131/49, HR 46   Gait x 6 minutes.   1037 ft, 4-5/10 low back pain afterwards. Pt states L LE and R LE felt like lead towards the end. Also wanted to lean forward towards the end of the laps. Back pain eases with sitting   BP afterwards, L arm sitting, mechanically taken, normal cuff: 152/56, HR 63  Sitting rest break afterwards secondary to fatigue.    Seated manually resisted L lateral shift (to counter R lateral shift posture) B  10x3 with 5 second holds  Seated manually reisted L upper trunk rotation 10x2 with 5 second holds  B anterior thigh muscle work  Seated forward physioball rolls to the R 10x2 with 5 secodn holds to decrease L low back pressure  S/L hip abduction   L 10x2   R anterior thigh symptoms.   S/L clamshell  L 10x  R anterior thigh symptoms.    Improved exercise technique, movement at target joints, use of target muscles after min to mod verbal, visual, tactile cues.     Response to treatment Rest break after gait x 6 minutes secondary to fatigue. R anterior thigh symptoms and S/L L glute med exercises, eases with rest.    Clinical impression Pt overall reports improved back pain during subjective reports following hip and thigh stretches. Symptoms seem to return with prolonged walking as well as when carrying items of about 8 lbs (1 gallon). Pt demonstrates L lateral lean during L LE stance phase of gait. Worked on decreasing pressure to L low back as well as L glute med muscle strengthening to help decrease L lateral lean compensation during gait. Pt will benefit from continued skilled physical therapy services to decrease pain, improve strength, endurance, and function.    PT Short Term Goals - 10/17/19 0956      PT SHORT TERM GOAL #1   Title  Pt will be independent with his HEP to decrease pain, improve strength and function.    Baseline  Pt performing his HEP based on subjective reports (10/17/2019)    Time  3     Period  Weeks    Status  Achieved    Target Date  10/20/19        PT Long Term Goals - 10/17/19 0810      PT LONG TERM GOAL #1   Title  Patient will have a decrease in low back pain to 2/10 or less at worst to promote ability to lift items as  well as perform standing tasks more comfortably.    Baseline  5/10 low back pain at most for the past 3 months (09/07/2019); 2-3/10 at worst for the past 7 days (10/17/2019)    Period  Weeks    Status  Partially Met    Target Date  10/20/19      PT LONG TERM GOAL #2   Title  Patient will improve bilateral hip extension and abduction strength by at least 1/2 MMT grade to promote ability to perform standing tasks more comfortably.    Baseline  hip extension 4-/5 R, and L; hip abduction 4/5 R, and L (09/07/2019)    Time  6    Period  Weeks    Status  On-going    Target Date  10/20/19      PT LONG TERM GOAL #3   Title  Pt will improve lumbar FOTO score by at least 10 points as a demonstration of improved function.    Baseline  lumbar FOTO 58 (09/07/2019)    Time  6    Period  Weeks    Status  On-going    Target Date  10/20/19      PT LONG TERM GOAL #4   Title  Pt will improve  6 minute walk test distance to 1328 ft or more to promote ability to ambulate further.    Baseline  1228 ft with 1/10 central low back pain (09/07/2019); 1037 ft (10/17/2019)    Time  6    Period  Weeks    Status  On-going    Target Date  10/20/19            Plan - 10/17/19 0805    Clinical Impression Statement  Pt overall reports improved back pain during subjective reports following hip and thigh stretches. Symptoms seem to return with prolonged walking as well as when carrying items of about 8 lbs (1 gallon). Pt demonstrates L lateral lean during L LE stance phase of gait. Worked on decreasing pressure to L low back as well as L glute med muscle strengthening to help decrease L lateral lean compensation during gait. Pt will benefit from continued skilled physical  therapy services to decrease pain, improve strength, endurance, and function.    Personal Factors and Comorbidities  Age;Comorbidity 3+;Past/Current Experience;Time since onset of injury/illness/exacerbation    Comorbidities  DM, HTN, Parkinson's disease    Examination-Activity Limitations  Lift;Carry;Stand    Stability/Clinical Decision Making  Evolving/Moderate complexity   pain seems to be worsening per pt subjective reports   Rehab Potential  Fair    PT Frequency  2x / week    PT Duration  6 weeks    PT Treatment/Interventions  Therapeutic activities;Therapeutic exercise;Neuromuscular re-education;Patient/family education;Manual techniques;Dry needling;Aquatic Therapy;Electrical Stimulation;Iontophoresis 29m/ml Dexamethasone    Consulted and Agree with Plan of Care  Patient       Patient will benefit from skilled therapeutic intervention in order to improve the following deficits and impairments:  Pain, Postural dysfunction, Improper body mechanics, Decreased strength  Visit Diagnosis: Chronic left-sided low back pain without sciatica  Muscle weakness (generalized)     Problem List Patient Active Problem List   Diagnosis Date Noted  . Low back pain of over 3 months duration 08/19/2019  . Pulmonary hypertension ([place]   . Insomnia 12/21/2018  . Sleep apnea in adult 12/09/2018  . Periodic limb movement disorder 12/09/2018  . Abnormal findings on diagnostic imaging of lung 07/09/2018  . Lung nodule 07/09/2018  .  Pulmonary nodules 05/18/2018  . Wears hearing aid in both ears 05/17/2018  . Leg pain, bilateral 02/20/2018  . Dyspnea   . Effort angina ([facility])   . CKD (chronic kidney disease) stage 3, GFR 30-59 ml/min 09/21/2017  . History of skin cancer in adulthood 08/14/2016  . Parkinson's disease ([place]) 08/09/2016  . Bilateral carotid artery stenosis 04/02/2015  . Vertigo, peripheral 10/17/2014  . Benign prostatic hypertrophy with urinary frequency 01/31/2014  . Encounter for  Medicare annual wellness exam 07/02/2013  . Obesity 04/03/2013  . Other malaise and fatigue 09/21/2012  . Hyperlipidemia   . Hypertension   . 3-vessel coronary artery disease   . S/P CABG x 5   . 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, [place], [address] Phone: [phone]   Fax:  [phone]  Name: Shante [place]. MRN: [id number] Date of Birth: [birth year]

## 2019-10-19 ENCOUNTER — Other Ambulatory Visit: Payer: Self-pay

## 2019-10-19 ENCOUNTER — Ambulatory Visit: Payer: Medicare Other

## 2019-10-19 DIAGNOSIS — M6281 Muscle weakness (generalized): Secondary | ICD-10-CM

## 2019-10-19 DIAGNOSIS — M545 Low back pain: Secondary | ICD-10-CM | POA: Diagnosis not present

## 2019-10-19 DIAGNOSIS — G8929 Other chronic pain: Secondary | ICD-10-CM | POA: Diagnosis not present

## 2019-10-19 NOTE — Patient Instructions (Addendum)
Access Code: [email or web address] URL: [email or web address] Date: 09/22/2019 Prepared by: Joneen Boers  Exercises Seated Hip Internal Rotation AROM - 3 x daily - 7 x weekly - 3 sets - 10 reps Seated Scapular Retraction - 1 x daily - 7 x weekly - 3 sets - 10 reps - 5 seconds hold Standing Single Leg Stance with Counter Support - 1 x daily - 7 x weekly - 3 sets - 10 reps - 5 seconds hold

## 2019-10-19 NOTE — Therapy (Signed)
[place] [facility] [address] [place], [address] Phone: [phone]   Fax:  [phone]  Physical Therapy Treatment  Patient Details  Name: Johnathan Arnold. MRN: [id number] Date of Birth: 12/11/1945 Referring Provider (PT): Deborra Medina, MD   Encounter Date: 10/19/2019  PT End of Session - 10/19/19 0951    Visit Number  7    Number of Visits  25    Date for PT Re-Evaluation  12/01/19    Authorization Type  7    Authorization Time Period  of 10 progress report    PT Start Time  0951    PT Stop Time  1034    PT Time Calculation (min)  43 min    Activity Tolerance  Patient tolerated treatment well    Behavior During Therapy  [facility] for tasks assessed/performed       Past Medical History:  Diagnosis Date  . 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 Diabetes mellitus without complication ([place])   . History of cardiac catheterization 2011   [facility]  . Hyperlipidemia   . Hypertension   . Hypertriglyceridemia   . Parkinson's disease ([place])   . 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 Vertigo     Past Surgical History:  Procedure Laterality Date  . CARDIAC CATHETERIZATION  05-19-2010   [facility]: Patent grafts. LIMA to LAD, SVG to D1, OM1 and RPDA  . CORONARY ARTERY BYPASS GRAFT  03/1998   5 vessel, [facility]  . RIGHT HEART CATH N/A 04/04/2019   Procedure: RIGHT HEART CATH;  Surgeon: Wellington Hampshire, MD;  Location: [place] [facility];  Service: Cardiovascular;  Laterality: N/A;  . RIGHT/LEFT HEART CATH AND CORONARY ANGIOGRAPHY N/A 02/01/2018   Procedure: RIGHT/LEFT HEART CATH AND CORONARY ANGIOGRAPHY;  Surgeon: Wellington Hampshire, MD;  Location: [place] [facility];  Service: Cardiovascular;  Laterality: N/A;    There were no vitals filed for this visit.  Subjective Assessment - 10/19/19 0955    Subjective  No back pain currently. 5/10 at most for the past 7 days. Mowed the yard yesterday. Cleared up when  pt sat on the chair for about 10-15 minutes. Did not take any Tylenol. Took tylenol and/or sometimes Advil prior to starting PT when his back bothered him.  Before PT, pt takes tylenol and his pain takes 30 minutes to clear up. Took 15 minutes to clear up yesterday without medication. Pt states that his diastolic numbers are usually in the high 40's like 48 or 49 mmHg. Has an appointment with his vascular doctor in the near future.    Pertinent History  Chronic L low back pain without sciatica. Has a lot of weakness and soreness B LE. Had a back x-ray which revealed a bulging disc. 5-10 years ago, pt fracured lower back and ever since then his back would hurt when he is standing. Went to a Restaurant manager, fast food which helped a lot back then. However, nowadays, his back would hurt again the next day. Denies loss of bowel or bladder control. Denies LE paresthesia. No falls within the past 6 months. No fear of falling. Next follow up appointment with Dr. Derrel Nip is in maybe 6 months.  Goes to heart track 3 days a week to work out on some of the machines there.  Pt adds that he has Parkinson's. Currently participates in [employer] for his Parkinson's as recommended by his doctor.  Feels like he has not strength in his legs since 3-4 years ago. Has been checked by his cardiologist. Also talked to his neurologist (Dr. Carles Collet) about it who did not say anything.    Patient Stated Goals  Get rid of the pain. See if PT can help with his leg soress and weakness.    Currently in Pain?  No/denies    Pain Score  0-No pain    Pain Onset  More than a month ago                               PT Education - 10/19/19 1008    Education Details  ther-ex, HEP    Person(s) Educated  Patient    Methods  Explanation;Demonstration;Tactile cues;Verbal cues;Handout    Comprehension  Returned demonstration;Verbalized understanding        Objectives   MedbridgeAccess Code [email or web address]   Hx ofold mildL1  compressiondeformityfx based on radiograph 02/17/18  Walking about 30 seconds bothers his anterior thigh muscles, during inclines  Posture: R lateral shift, R lumbar rotation    Last scheduled visit: 11/02/2019  Therapeutic exercise  Blood pressure L arm sitting, mechanically taken, normal cuff: 168/52, HR 46at start of session  Pt took his blood pressure medication this morning aroun 8:30 or 9 am this morning.   MMT held off today secondary to elevated systolic level  SLS with 2 finger assist, emphasis on level pelvis to promote glute med muscle strengthening to decrease lateral lean compensation  R 10x5 seconds for 3 sets  L 10x5 seconds for 3 sets  Reviewed and given as part of his HEP. Pt demonstrated and verbalized understanding. Handout provided.    Static mini lunge with one UE light touch assist  R 10x  L 10x  Pt states medium level effort  Pt states feeling out of breath and feeling bilateral anterior thigh symptoms. States that his muscles feel like it is not getting oxygen.   BP 180/53, HR 51, L arm sitting, mechanically taken, normal cuff. Rest break provided.   No lightheadedness, dizziness, blurred vision. No slurred speech or weakness observed.   At end of session  BP 148/48, HR 46, L arm sitting, mechanically taken, normal cuff.     Improved exercise technique, movement at target joints, use of target muscles after min to mod verbal, visual, tactile cues.     Response to treatment Pt tolerated session well without aggravation of symptoms.   Clinical impression Pt demonstrates some overall improvement in L low back pain with decreased need to use medications such as Tylenol and Advil with improved time it takes to ease off compared to prior to starting PT (from 30 minutes to 15 minutes) based on subjective reports. Pt still demonstrates glute med and max weakness based on clinical observation, low back and bilateral anterior thigh pain, and  difficulty ambulating long distances secondary to low back, bilateral anterior thigh pain and fatigue and would benefit from continued skilled physical therapy services to address the aforementioned deficits.     PT Short Term Goals - 10/17/19 0956      PT SHORT TERM GOAL #1   Title  Pt will be independent with his HEP to decrease pain, improve strength and function.    Baseline  Pt performing his HEP based on subjective reports (10/17/2019)    Time  3    Period  Weeks    Status  Achieved  Target Date  10/20/19        PT Long Term Goals - 10/19/19 1001      PT LONG TERM GOAL #1   Title  Patient will have a decrease in low back pain to 2/10 or less at worst to promote ability to lift items as well as perform standing tasks more comfortably.    Baseline  5/10 low back pain at most for the past 3 months (09/07/2019); 2-3/10 at worst for the past 7 days (10/17/2019); 5/10 at most for the past 7 days, yesterday (10/19/2019)    Time  6    Period  Weeks    Status  Partially Met    Target Date  12/01/19      PT LONG TERM GOAL #2   Title  Patient will improve bilateral hip extension and abduction strength by at least 1/2 MMT grade to promote ability to perform standing tasks more comfortably.    Baseline  hip extension 4-/5 R, and L; hip abduction 4/5 R, and L (09/07/2019); Not tested today secondary to elevated blood pressure levels (10/19/2019)    Time  6    Period  Weeks    Status  On-going    Target Date  12/01/19      PT LONG TERM GOAL #3   Title  Pt will improve lumbar FOTO score by at least 10 points as a demonstration of improved function.    Baseline  lumbar FOTO 58 (09/07/2019)    Time  6    Period  Weeks    Status  On-going    Target Date  12/01/19      PT LONG TERM GOAL #4   Title  Pt will improve  6 minute walk test distance to 1328 ft or more to promote ability to ambulate further.    Baseline  1228 ft with 1/10 central low back pain (09/07/2019); 1037 ft (10/17/2019)     Time  6    Period  Weeks    Status  On-going    Target Date  12/01/19            Plan - 10/19/19 1008    Clinical Impression Statement  Pt demonstrates some overall improvement in L low back pain with decreased need to use medications such as Tylenol and Advil with improved time it takes to ease off compared to prior to starting PT (from 30 minutes to 15 minutes) based on subjective reports. Pt still demonstrates glute med and max weakness based on clinical observation, low back and bilateral anterior thigh pain, and difficulty ambulating long distances secondary to low back, bilateral anterior thigh pain and fatigue and would benefit from continued skilled physical therapy services to address the aforementioned deficits.    Personal Factors and Comorbidities  Age;Comorbidity 3+;Past/Current Experience;Time since onset of injury/illness/exacerbation    Comorbidities  DM, HTN, Parkinson's disease    Examination-Activity Limitations  Lift;Carry;Stand    Stability/Clinical Decision Making  Stable/Uncomplicated   pain seems to be worsening per pt subjective reports   Clinical Decision Making  Low    Rehab Potential  Fair    PT Frequency  2x / week    PT Duration  6 weeks    PT Treatment/Interventions  Therapeutic activities;Therapeutic exercise;Neuromuscular re-education;Patient/family education;Manual techniques;Dry needling;Aquatic Therapy;Electrical Stimulation;Iontophoresis 90m/ml Dexamethasone    Consulted and Agree with Plan of Care  Patient       Patient will benefit from skilled therapeutic intervention in order to improve the following  deficits and impairments:  Pain, Postural dysfunction, Improper body mechanics, Decreased strength  Visit Diagnosis: Chronic left-sided low back pain without sciatica - Plan: PT plan of care cert/re-cert  Muscle weakness (generalized) - Plan: PT plan of care cert/re-cert     Problem List Patient Active Problem List   Diagnosis Date Noted   . Low back pain of over 3 months duration 08/19/2019  . Pulmonary hypertension ([place])   . Insomnia 12/21/2018  . Sleep apnea in adult 12/09/2018  . Periodic limb movement disorder 12/09/2018  . Abnormal findings on diagnostic imaging of lung 07/09/2018  . Lung nodule 07/09/2018  . Pulmonary nodules 05/18/2018  . Wears hearing aid in both ears 05/17/2018  . Leg pain, bilateral 02/20/2018  . Dyspnea   . Effort angina (H[facility])   . CKD (chronic kidney disease) stage 3, GFR 30-59 ml/min 09/21/2017  . History of skin cancer in adulthood 08/14/2016  . Parkinson's disease ([place]) 08/09/2016  . Bilateral carotid artery stenosis 04/02/2015  . Vertigo, peripheral 10/17/2014  . Benign prostatic hypertrophy with urinary frequency 01/31/2014  . Encounter for Medicare annual wellness exam 07/02/2013  . Obesity 04/03/2013  . Other malaise and fatigue 09/21/2012  . Hyperlipidemia   . Hypertension   . 3-vessel coronary artery disease   . S/P CABG x 5   . 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, [place], [address] Phone: [phone]   Fax:  [phone]  Name: Johnathan Arnold. MRN: [id number] Date of Birth: 13-Dec-1945

## 2019-10-24 ENCOUNTER — Ambulatory Visit: Payer: Medicare Other

## 2019-10-24 ENCOUNTER — Other Ambulatory Visit: Payer: Self-pay

## 2019-10-24 DIAGNOSIS — M545 Low back pain, unspecified: Secondary | ICD-10-CM

## 2019-10-24 DIAGNOSIS — G8929 Other chronic pain: Secondary | ICD-10-CM | POA: Diagnosis not present

## 2019-10-24 DIAGNOSIS — M6281 Muscle weakness (generalized): Secondary | ICD-10-CM | POA: Diagnosis not present

## 2019-10-24 NOTE — Therapy (Signed)
[place] [facility] [address] [place], [address] Phone: [phone]   Fax:  [phone]  Physical Therapy Treatment  Patient Details  Name: Johnathan Arnold. MRN: [id number] Date of Birth: 28-Jul-1945 Referring Provider (PT): Deborra Medina, MD   Encounter Date: 10/24/2019  PT End of Session - 10/24/19 0948    Visit Number  8    Number of Visits  25    Date for PT Re-Evaluation  12/01/19    Authorization Type  8    Authorization Time Period  of 10 progress report    PT Start Time  0949    PT Stop Time  1035    PT Time Calculation (min)  46 min    Activity Tolerance  Patient tolerated treatment well    Behavior During Therapy  [facility] for tasks assessed/performed       Past Medical History:  Diagnosis Date  . 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 Diabetes mellitus without complication ([place])   . History of cardiac catheterization 2011   [facility]  . Hyperlipidemia   . Hypertension   . Hypertriglyceridemia   . Parkinson's disease ([place])   . 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 Vertigo     Past Surgical History:  Procedure Laterality Date  . CARDIAC CATHETERIZATION  05-19-2010   [facility]: Patent grafts. LIMA to LAD, SVG to D1, OM1 and RPDA  . CORONARY ARTERY BYPASS GRAFT  03/1998   5 vessel, [facility]  . RIGHT HEART CATH N/A 04/04/2019   Procedure: RIGHT HEART CATH;  Surgeon: Wellington Hampshire, MD;  Location: [place] [facility];  Service: Cardiovascular;  Laterality: N/A;  . RIGHT/LEFT HEART CATH AND CORONARY ANGIOGRAPHY N/A 02/01/2018   Procedure: RIGHT/LEFT HEART CATH AND CORONARY ANGIOGRAPHY;  Surgeon: Wellington Hampshire, MD;  Location: [place] [facility];  Service: Cardiovascular;  Laterality: N/A;    There were no vitals filed for this visit.  Subjective Assessment - 10/24/19 0951    Subjective  Doing ok, no pain or discomfort. The thighs have been ok. No feeling of being out of breath recently.     Pertinent History  Chronic L low back pain without sciatica. Has a lot of weakness and soreness B LE. Had a back x-ray which revealed a bulging disc. 5-10 years ago, pt fracured lower back and ever since then his back would hurt when he is standing. Went to a Restaurant manager, fast food which helped a lot back then. However, nowadays, his back would hurt again the next day. Denies loss of bowel or bladder control. Denies LE paresthesia. No falls within the past 6 months. No fear of falling. Next follow up appointment with Dr. Derrel Nip is in maybe 6 months.  Goes to heart track 3 days a week to work out on some of the machines there.  Pt adds that he has Parkinson's. Currently participates in [employer] for his Parkinson's as recommended by his doctor.  Feels like he has not strength in his legs since 3-4 years ago. Has been checked by his cardiologist. Also talked to his neurologist (Dr. Carles Collet) about it who did not say anything.    Patient Stated Goals  Get rid of the pain. See if PT can help with his leg soress and weakness.    Currently in Pain?  No/denies    Pain Score  0-No pain    Pain Onset  More  than a month ago                                PT Education - 10/24/19 1010    Education Details  ther-ex    [employer]) Educated  Patient    Methods  Explanation;Demonstration;Tactile cues;Verbal cues    Comprehension  Returned demonstration;Verbalized understanding       Objectives   MedbridgeAccess Code [email or web address]   Hx ofold mildL1 compressiondeformityfx based on radiograph 02/17/18  Walking about 30 seconds bothers his anterior thigh muscles, during inclines  Posture: R lateral shift, R lumbar rotation    Last scheduled visit: 11/02/2019  Manual therapy: Seated STM R lumbar paraspinal muscle. Slight increased tenderness   Therapeutic exercise  Blood pressure L arm sitting, mechanically taken, normal cuff: 174/52, HR 44at start of session              Standing hip abduction with B UE assist   R 10x, then 10x5 seconds for 2 sets  L 10x, then 10x5 seconds for 2 sets  Glute med muscle use felt  Side stepping 20 ft to the R and 20 ft to the L   Then with yellow band around distal thighs 20 ft to the R and 20 ft to the L   Then around ankles 20 ft to the R and 20 ft to the L, then 32 ft to the R and 32 ft to the L. Good glute med muscle use felt.   Blood pressure L arm sitting, mechanically taken, normal cuff: 184/5, HR 48at start of session Blood pressure manually taken: 192/70, L arm sitting, normal cuff. No symptoms reported by pt.   After rest:             BP 151/50, HR 44, L arm sitting, mechanically taken, normal cuff.   S/L hip abduction   R 10x2. More difficult compared to L side  L 10x3  Very good glute med muscle use felt.  Reviewed and given as part of HEP. Pt demonstrated and verbalized understanding, Handout provided. PT was instructed to make sure that his vitals are at an appropriate range prior to doing his HEP. Pt demonstrated and verbalized understanding.   Improved exercise technique, movement at target joints, use of target muscles aftermin tomod verbal, visual, tactile cues.    Response to treatment Pt tolerated session well without aggravation of symptoms.   Clinical impression Continued working on glute med muscle strengthening to decrease lateral lean compensation during gait and decrease stress to low back. Good muscle use felt with exercises. Pt tolerated session well without aggravation of symptoms. Pt will benefit from continued skilled physical therapy services to decrease pain, improve strength and function.      PT Short Term Goals - 10/17/19 0956      PT SHORT TERM GOAL #1   Title  Pt will be independent with his HEP to decrease pain, improve strength and function.    Baseline  Pt performing his HEP based on subjective reports (10/17/2019)    Time  3    Period  Weeks    Status   Achieved    Target Date  10/20/19        PT Long Term Goals - 10/19/19 1001      PT LONG TERM GOAL #1   Title  Patient will have a decrease in low back pain to 2/10 or less at worst to promote  ability to lift items as well as perform standing tasks more comfortably.    Baseline  5/10 low back pain at most for the past 3 months (09/07/2019); 2-3/10 at worst for the past 7 days (10/17/2019); 5/10 at most for the past 7 days, yesterday (10/19/2019)    Time  6    Period  Weeks    Status  Partially Met    Target Date  12/01/19      PT LONG TERM GOAL #2   Title  Patient will improve bilateral hip extension and abduction strength by at least 1/2 MMT grade to promote ability to perform standing tasks more comfortably.    Baseline  hip extension 4-/5 R, and L; hip abduction 4/5 R, and L (09/07/2019); Not tested today secondary to elevated blood pressure levels (10/19/2019)    Time  6    Period  Weeks    Status  On-going    Target Date  12/01/19      PT LONG TERM GOAL #3   Title  Pt will improve lumbar FOTO score by at least 10 points as a demonstration of improved function.    Baseline  lumbar FOTO 58 (09/07/2019)    Time  6    Period  Weeks    Status  On-going    Target Date  12/01/19      PT LONG TERM GOAL #4   Title  Pt will improve  6 minute walk test distance to 1328 ft or more to promote ability to ambulate further.    Baseline  1228 ft with 1/10 central low back pain (09/07/2019); 1037 ft (10/17/2019)    Time  6    Period  Weeks    Status  On-going    Target Date  12/01/19            Plan - 10/24/19 1010    Clinical Impression Statement  Continued working on glute med muscle strengthening to decrease lateral lean compensation during gait and decrease stress to low back. Good muscle use felt with exercises. Pt tolerated session well without aggravation of symptoms. Pt will benefit from continued skilled physical therapy services to decrease pain, improve strength and function.     Personal Factors and Comorbidities  Age;Comorbidity 3+;Past/Current Experience;Time since onset of injury/illness/exacerbation    Comorbidities  DM, HTN, Parkinson's disease    Examination-Activity Limitations  Lift;Carry;Stand    Stability/Clinical Decision Making  Stable/Uncomplicated   pain seems to be worsening per pt subjective reports   Rehab Potential  Fair    PT Frequency  2x / week    PT Duration  6 weeks    PT Treatment/Interventions  Therapeutic activities;Therapeutic exercise;Neuromuscular re-education;Patient/family education;Manual techniques;Dry needling;Aquatic Therapy;Electrical Stimulation;Iontophoresis 26m/ml Dexamethasone    Consulted and Agree with Plan of Care  Patient       Patient will benefit from skilled therapeutic intervention in order to improve the following deficits and impairments:  Pain, Postural dysfunction, Improper body mechanics, Decreased strength  Visit Diagnosis: Chronic left-sided low back pain without sciatica  Muscle weakness (generalized)     Problem List Patient Active Problem List   Diagnosis Date Noted  . Low back pain of over 3 months duration 08/19/2019  . Pulmonary hypertension ([place]   . Insomnia 12/21/2018  . Sleep apnea in adult 12/09/2018  . Periodic limb movement disorder 12/09/2018  . Abnormal findings on diagnostic imaging of lung 07/09/2018  . Lung nodule 07/09/2018  . Pulmonary nodules 05/18/2018  . Wears hearing  aid in both ears 05/17/2018  . Leg pain, bilateral 02/20/2018  . Dyspnea   . Effort angina ([facility])   . CKD (chronic kidney disease) stage 3, GFR 30-59 ml/min 09/21/2017  . History of skin cancer in adulthood 08/14/2016  . Parkinson's disease ([place]) 08/09/2016  . Bilateral carotid artery stenosis 04/02/2015  . Vertigo, peripheral 10/17/2014  . Benign prostatic hypertrophy with urinary frequency 01/31/2014  . Encounter for Medicare annual wellness exam 07/02/2013  . Obesity 04/03/2013  . Other malaise and  fatigue 09/21/2012  . Hyperlipidemia   . Hypertension   . 3-vessel coronary artery disease   . S/P CABG x 5   . 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, [place], [address] Phone: [phone]   Fax:  [phone]  Name: Johnathan Arnold. MRN: [id number] Date of Birth: November 24, 1945

## 2019-10-24 NOTE — Patient Instructions (Signed)
Access Code: [email or web address] URL: [email or web address] Date: 09/22/2019 Prepared by: Joneen Boers  Exercises Seated Hip Internal Rotation AROM - 3 x daily - 7 x weekly - 3 sets - 10 reps Seated Scapular Retraction - 1 x daily - 7 x weekly - 3 sets - 10 reps - 5 seconds hold Standing Single Leg Stance with Counter Support - 1 x daily - 7 x weekly - 3 sets - 10 reps - 5 seconds hold Sidelying Hip Abduction - 3 x daily - 7 x weekly - 1 sets - 10 reps   PT was instructed to make sure that his vitals are at an appropriate range prior to doing his HEP. Pt demonstrated and verbalized understanding.

## 2019-10-26 ENCOUNTER — Ambulatory Visit: Payer: Medicare Other

## 2019-10-26 ENCOUNTER — Other Ambulatory Visit: Payer: Self-pay

## 2019-10-26 DIAGNOSIS — G8929 Other chronic pain: Secondary | ICD-10-CM | POA: Diagnosis not present

## 2019-10-26 DIAGNOSIS — M6281 Muscle weakness (generalized): Secondary | ICD-10-CM

## 2019-10-26 DIAGNOSIS — M545 Low back pain, unspecified: Secondary | ICD-10-CM

## 2019-10-26 NOTE — Therapy (Signed)
[facility] [address], [place], [address] Phone: [phone]   Fax:  [phone]  Physical Therapy Treatment  Patient Details  Name: Johnathan Arnold. MRN: [id number] Date of Birth: December 05, 1945  Referring Provider (PT): Deborra Medina, MD   Encounter Date: 10/26/2019  PT End of Session - 10/26/19 0952    Visit Number  9    Number of Visits  25    Date for PT Re-Evaluation  12/01/19    Authorization Type  9    Authorization Time Period  of 10 progress report    PT Start Time  0951    PT Stop Time  1031    PT Time Calculation (min)  40 min    Activity Tolerance  Patient tolerated treatment well    Behavior During Therapy  [facility] for tasks assessed/performed       Past Medical History:  Diagnosis Date  . 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 Diabetes mellitus without complication ([place])   . History of cardiac catheterization 2011   [facility]  . Hyperlipidemia   . Hypertension   . Hypertriglyceridemia   . Parkinson's disease ([place])   . 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 Vertigo     Past Surgical History:  Procedure Laterality Date  . CARDIAC CATHETERIZATION  05-19-2010   [facility]: Patent grafts. LIMA to LAD, SVG to D1, OM1 and RPDA  . CORONARY ARTERY BYPASS GRAFT  03/1998   5 vessel, [facility]  . RIGHT HEART CATH N/A 04/04/2019   Procedure: RIGHT HEART CATH;  Surgeon: Wellington Hampshire, MD;  Location: [place] [facility];  Service: Cardiovascular;  Laterality: N/A;  . RIGHT/LEFT HEART CATH AND CORONARY ANGIOGRAPHY N/A 02/01/2018   Procedure: RIGHT/LEFT HEART CATH AND CORONARY ANGIOGRAPHY;  Surgeon: Wellington Hampshire, MD;  Location: [place] [facility];  Service: Cardiovascular;  Laterality: N/A;    There were no vitals filed for this visit.  Subjective Assessment - 10/26/19 0953    Subjective  Took his blood pressure medication this morning. Back is not bothering him. Has not done anything  strenuous. Did not feel good yesterday pertaining to Parkinson's.  Felt very sleepy and fatigued yesterday.    Pertinent History  Chronic L low back pain without sciatica. Has a lot of weakness and soreness B LE. Had a back x-ray which revealed a bulging disc. 5-10 years ago, pt fracured lower back and ever since then his back would hurt when he is standing. Went to a Restaurant manager, fast food which helped a lot back then. However, nowadays, his back would hurt again the next day. Denies loss of bowel or bladder control. Denies LE paresthesia. No falls within the past 6 months. No fear of falling. Next follow up appointment with Dr. Derrel Nip is in maybe 6 months.  Goes to heart track 3 days a week to work out on some of the machines there.  Pt adds that he has Parkinson's. Currently participates in [employer] for his Parkinson's as recommended by his doctor.  Feels like he has not strength in his legs since 3-4 years ago. Has been checked by his cardiologist. Also talked to his neurologist (Dr. Carles Collet) about it who did not say anything.    Patient Stated Goals  Get rid of the pain. See if PT can help with his leg soress and weakness.    Currently in Pain?  No/denies  Pain Score  0-No pain    Pain Onset  More than a month ago         [facility] PT Assessment - 10/26/19 1001      Strength   Right Hip Extension  4-/5    Right Hip ABduction  4/5    Left Hip Extension  4/5    Left Hip ABduction  4/5                            PT Education - 10/26/19 0956    Education Details  ther-ex    Person(s) Educated  Patient    Methods  Explanation;Demonstration;Tactile cues;Verbal cues    Comprehension  Returned demonstration;Verbalized understanding      Objectives   MedbridgeAccess Code [email or web address]   Hx ofold mildL1 compressiondeformityfx based on radiograph 02/17/18  Walking about 30 seconds bothers his anterior thigh muscles, during inclines  Posture: R lateral shift, R  lumbar rotation    Last scheduled visit: 11/02/2019  Manual therapy: Seated STM R lumbar paraspinal muscle. Slight increased tenderness   Therapeutic exercise  Blood pressure L arm sitting, mechanically taken, normal cuff: 155/58, HR 44at start of session  Pt personal blood pressure cuff: 917/91 (was 505 diastolic when he took it earlier)     S/L hip abduction              R 10x3. More difficult compared to L side             L 10x3            Prone manually resisted hip extension 1x each LE  Reviewed current status with hip strength with pt.   Improved L hip extension strength  Prone glute max set   R 10x5 seconds for 2 sets  L 10x5 seconds for 2 sets  Reviewed and given as part of his HEP. Pt demonstrated and verbalized understanding. Handout provided.   Prone quad stretch with PT. Stretch is similar feeling to his anterior thigh symptoms during long distance gait  R 30 seconds x 3  L 30 seconds x 3   Continue working on quadriceps stretching  Prone glute max extension   R 8x  L 8x   Standing hip abduction with B UE assist              R  10x5 seconds              L 10x5 seconds             Glute med muscle use felt     Improved exercise technique, movement at target joints, use of target muscles aftermin tomod verbal, visual, tactile cues.    Response to treatment Pt tolerated session well without aggravation of symptoms.Good glute med and max muscle use felt.   Clinical impression Continued working on glute med muscle strengthening to decrease lateral lean compensation during gait and decrease stress to low back. Improved L glute max strength compared to initial evaluation. Similar sensation felt anterior thighs during prone quad stretch, suggesting possible muscular involvement with anterior thigh symptoms during prolonged gait. Pt tolerated session well without aggravation of symptoms. Pt will benefit from continued skilled  physical therapy services to decrease pain, improve strength and function and ability to ambulate longer distances.       PT Short Term Goals - 10/17/19 0956      PT SHORT TERM GOAL #1   Title  Pt will be independent with his HEP to decrease pain, improve strength and function.    Baseline  Pt performing his HEP based on subjective reports (10/17/2019)    Time  3    Period  Weeks    Status  Achieved    Target Date  10/20/19        PT Long Term Goals - 10/19/19 1001      PT LONG TERM GOAL #1   Title  Patient will have a decrease in low back pain to 2/10 or less at worst to promote ability to lift items as well as perform standing tasks more comfortably.    Baseline  5/10 low back pain at most for the past 3 months (09/07/2019); 2-3/10 at worst for the past 7 days (10/17/2019); 5/10 at most for the past 7 days, yesterday (10/19/2019)    Time  6    Period  Weeks    Status  Partially Met    Target Date  12/01/19      PT LONG TERM GOAL #2   Title  Patient will improve bilateral hip extension and abduction strength by at least 1/2 MMT grade to promote ability to perform standing tasks more comfortably.    Baseline  hip extension 4-/5 R, and L; hip abduction 4/5 R, and L (09/07/2019); Not tested today secondary to elevated blood pressure levels (10/19/2019)    Time  6    Period  Weeks    Status  On-going    Target Date  12/01/19      PT LONG TERM GOAL #3   Title  Pt will improve lumbar FOTO score by at least 10 points as a demonstration of improved function.    Baseline  lumbar FOTO 58 (09/07/2019)    Time  6    Period  Weeks    Status  On-going    Target Date  12/01/19      PT LONG TERM GOAL #4   Title  Pt will improve  6 minute walk test distance to 1328 ft or more to promote ability to ambulate further.    Baseline  1228 ft with 1/10 central low back pain (09/07/2019); 1037 ft (10/17/2019)    Time  6    Period  Weeks    Status  On-going    Target Date  12/01/19             Plan - 10/26/19 0955    Clinical Impression Statement  Continued working on glute med muscle strengthening to decrease lateral lean compensation during gait and decrease stress to low back. Improved L glute max strength compared to initial evaluation. Similar sensation felt anterior thighs during prone quad stretch, suggesting possible muscular involvement with anterior thigh symptoms during prolonged gait. Pt tolerated session well without aggravation of symptoms. Pt will benefit from continued skilled physical therapy services to decrease pain, improve strength and function and ability to ambulate longer distances.    Personal Factors and Comorbidities  Age;Comorbidity 3+;Past/Current Experience;Time since onset of injury/illness/exacerbation    Comorbidities  DM, HTN, Parkinson's disease    Examination-Activity Limitations  Lift;Carry;Stand    Stability/Clinical Decision Making  Stable/Uncomplicated   pain seems to be worsening per pt subjective reports   Rehab Potential  Fair    PT Frequency  2x / week    PT Duration  6 weeks    PT Treatment/Interventions  Therapeutic activities;Therapeutic exercise;Neuromuscular re-education;Patient/family education;Manual techniques;Dry needling;Aquatic Therapy;Electrical Stimulation;Iontophoresis 25m/ml Dexamethasone    Consulted  and Agree with Plan of Care  Patient       Patient will benefit from skilled therapeutic intervention in order to improve the following deficits and impairments:  Pain, Postural dysfunction, Improper body mechanics, Decreased strength  Visit Diagnosis: Chronic left-sided low back pain without sciatica  Muscle weakness (generalized)     Problem List Patient Active Problem List   Diagnosis Date Noted  . Low back pain of over 3 months duration 08/19/2019  . Pulmonary hypertension ([place])   . Insomnia 12/21/2018  . Sleep apnea in adult 12/09/2018  . Periodic limb movement disorder 12/09/2018  . Abnormal  findings on diagnostic imaging of lung 07/09/2018  . Lung nodule 07/09/2018  . Pulmonary nodules 05/18/2018  . Wears hearing aid in both ears 05/17/2018  . Leg pain, bilateral 02/20/2018  . Dyspnea   . Effort angina (H[facility])   . CKD (chronic kidney disease) stage 3, GFR 30-59 ml/min 09/21/2017  . History of skin cancer in adulthood 08/14/2016  . Parkinson's disease ([place]) 08/09/2016  . Bilateral carotid artery stenosis 04/02/2015  . Vertigo, peripheral 10/17/2014  . Benign prostatic hypertrophy with urinary frequency 01/31/2014  . Encounter for Medicare annual wellness exam 07/02/2013  . Obesity 04/03/2013  . Other malaise and fatigue 09/21/2012  . Hyperlipidemia   . Hypertension   . 3-vessel coronary artery disease   . S/P CABG x 5   . 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 [place], [address] Phone: [phone]   Fax:  [phone]  Name: Jahmal Dunavant. MRN: [id number] Date of Birth: [birth year]

## 2019-10-26 NOTE — Patient Instructions (Signed)
Access Code: [email or web address] URL: [email or web address] Date: 09/22/2019 Prepared by: Joneen Boers  Exercises Seated Hip Internal Rotation AROM - 3 x daily - 7 x weekly - 3 sets - 10 reps Seated Scapular Retraction - 1 x daily - 7 x weekly - 3 sets - 10 reps - 5 seconds hold Standing Single Leg Stance with Counter Support - 1 x daily - 7 x weekly - 3 sets - 10 reps - 5 seconds hold Sidelying Hip Abduction - 3 x daily - 7 x weekly - 1 sets - 10 reps Prone Quadriceps Set - 1 x daily - 7 x weekly - 3 sets - 10 reps

## 2019-10-31 ENCOUNTER — Other Ambulatory Visit: Payer: Self-pay

## 2019-10-31 ENCOUNTER — Ambulatory Visit: Payer: Medicare Other

## 2019-10-31 DIAGNOSIS — G8929 Other chronic pain: Secondary | ICD-10-CM | POA: Diagnosis not present

## 2019-10-31 DIAGNOSIS — M6281 Muscle weakness (generalized): Secondary | ICD-10-CM

## 2019-10-31 DIAGNOSIS — M545 Low back pain, unspecified: Secondary | ICD-10-CM

## 2019-10-31 NOTE — Therapy (Signed)
[facility] [address] [place], [address] Phone: [phone]   Fax:  [phone]  Physical Therapy Treatment And Progress Report (09/07/2019 - 10/31/2019)  Patient Details  Name: Johnathan Arnold. MRN: [id number] Date of Birth: 1946/02/11 Referring Provider (PT): Deborra Medina, MD   Encounter Date: 10/31/2019  PT End of Session - 10/31/19 0950    Visit Number  10    Number of Visits  25    Date for PT Re-Evaluation  12/01/19    Authorization Type  10    Authorization Time Period  of 10 progress report    PT Start Time  0950    PT Stop Time  1035    PT Time Calculation (min)  45 min    Activity Tolerance  Patient tolerated treatment well    Behavior During Therapy  [facility] for tasks assessed/performed       Past Medical History:  Diagnosis Date  . 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 Diabetes mellitus without complication ([place])   . History of cardiac catheterization 2011   [facility]  . Hyperlipidemia   . Hypertension   . Hypertriglyceridemia   . Parkinson's disease ([place])   . 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 Vertigo     Past Surgical History:  Procedure Laterality Date  . CARDIAC CATHETERIZATION  05-19-2010   [facility]: Patent grafts. LIMA to LAD, SVG to D1, OM1 and RPDA  . CORONARY ARTERY BYPASS GRAFT  03/1998   5 vessel, [facility]  . RIGHT HEART CATH N/A 04/04/2019   Procedure: RIGHT HEART CATH;  Surgeon: Wellington Hampshire, MD;  Location: [place] [facility];  Service: Cardiovascular;  Laterality: N/A;  . RIGHT/LEFT HEART CATH AND CORONARY ANGIOGRAPHY N/A 02/01/2018   Procedure: RIGHT/LEFT HEART CATH AND CORONARY ANGIOGRAPHY;  Surgeon: Wellington Hampshire, MD;  Location: [place] [facility];  Service: Cardiovascular;  Laterality: N/A;    There were no vitals filed for this visit.  Subjective Assessment - 10/31/19 0953    Subjective  A little stiff in low back and neck this morning.  Might have slept wrong. Was fine yesterday. 4/10 low back pain at most for the past 7 days Saturday, pt was using his riding mower and back pack blower.    Pertinent History  Chronic L low back pain without sciatica. Has a lot of weakness and soreness B LE. Had a back x-ray which revealed a bulging disc. 5-10 years ago, pt fracured lower back and ever since then his back would hurt when he is standing. Went to a Restaurant manager, fast food which helped a lot back then. However, nowadays, his back would hurt again the next day. Denies loss of bowel or bladder control. Denies LE paresthesia. No falls within the past 6 months. No fear of falling. Next follow up appointment with Dr. Derrel Nip is in maybe 6 months.  Goes to heart track 3 days a week to work out on some of the machines there.  Pt adds that he has Parkinson's. Currently participates in [employer] for his Parkinson's as recommended by his doctor.  Feels like he has not strength in his legs since 3-4 years ago. Has been checked by his cardiologist. Also talked to his neurologist (Dr. Carles Collet) about it who did not say anything.    Patient Stated Goals  Get rid of the pain. See if PT can help with his leg  soress and weakness.    Currently in Pain?  No/denies    Pain Score  0-No pain    Pain Onset  More than a month ago                                PT Education - 10/31/19 0958    Education Details  ther-ex    Person(s) Educated  Patient    Methods  Explanation;Demonstration;Tactile cues;Verbal cues    Comprehension  Returned demonstration;Verbalized understanding      Objectives   MedbridgeAccess Code [email or web address]   Hx ofold mildL1 compressiondeformityfx based on radiograph 02/17/18  Walking about 30 seconds bothers his anterior thigh muscles, during inclines  Posture: R lateral shift, R lumbar rotation    Last scheduled visit: 11/02/2019  Manual therapy: Seated STM R lumbar paraspinal muscle. Slight  increased tenderness   Therapeutic exercise  Blood pressure L arm sitting, mechanically taken, normal cuff: 142/48, HR 47at start of session             6 minutes gait  1168 ft  Less lateral lean observed today  Thighs did not bother him today. Slight low back discomfort 1-2/10.   S/L hip abduction  R 10x3. More difficult compared to L side L 10x3    Prone quad stretch with PT. Stretch is similar feeling to his anterior thigh symptoms during long distance gait             R 30 seconds x 3             L 30 seconds x 3              Continue working on quadriceps stretching  Prone glute max set              R 10x5 seconds for 2 sets             L 10x5 seconds for 2 sets              Improved exercise technique, movement at target joints, use of target muscles after mod verbal, visual, tactile cues.    Manual therapy  Prone STM R lumbar paraspinal muscle to decrease tension      Response to treatment Pt tolerated session well without aggravation of symptoms.Good glute med and max muscle use felt.   Clinical impression Pt demonstrates overall improved low back pain with decreased duration of symptoms and decreased need for use of medication based on subjective reports 10/19/2019. Pt also demonstrates improved 6 minute walk distance compared to previous measurement without complain of bilateral anterior thigh pain and with less lateral lean compensation observed during stance phase. Continued working on improving glute med and max strength and improving quadriceps flexibility to help decrease pain and symptoms. Pt will benefit from continued skilled physical therapy services to decrease pain, improve strength, and function.        PT Short Term Goals - 10/17/19 0956      PT SHORT TERM GOAL #1   Title  Pt will be independent with his HEP to decrease pain, improve strength and function.    Baseline  Pt performing his HEP based on  subjective reports (10/17/2019)    Time  3    Period  Weeks    Status  Achieved    Target Date  10/20/19        PT Long Term Goals - 10/31/19 8502  PT LONG TERM GOAL #1   Title  Patient will have a decrease in low back pain to 2/10 or less at worst to promote ability to lift items as well as perform standing tasks more comfortably.    Baseline  5/10 low back pain at most for the past 3 months (09/07/2019); 2-3/10 at worst for the past 7 days (10/17/2019); 5/10 at most for the past 7 days, yesterday (10/19/2019); 4/10 at most past 7 days (10/31/2019)    Time  6    Period  Weeks    Status  Partially Met    Target Date  12/01/19      PT LONG TERM GOAL #2   Title  Patient will improve bilateral hip extension and abduction strength by at least 1/2 MMT grade to promote ability to perform standing tasks more comfortably.    Baseline  hip extension 4-/5 R, and L; hip abduction 4/5 R, and L (09/07/2019); Not tested today secondary to elevated blood pressure levels (10/19/2019); Hip extension 4-/5 R, 4/5 L, hip abduction 4/5 R and L (10/31/2019)    Time  6    Period  Weeks    Status  On-going    Target Date  12/01/19      PT LONG TERM GOAL #3   Title  Pt will improve lumbar FOTO score by at least 10 points as a demonstration of improved function.    Baseline  lumbar FOTO 58 (09/07/2019); 53 (10/31/2019)    Time  6    Period  Weeks    Status  On-going    Target Date  12/01/19      PT LONG TERM GOAL #4   Title  Pt will improve  6 minute walk test distance to 1328 ft or more to promote ability to ambulate further.    Baseline  1228 ft with 1/10 central low back pain (09/07/2019); 1037 ft (10/17/2019)    Time  6    Period  Weeks    Status  On-going    Target Date  12/01/19            Plan - 10/31/19 0958    Clinical Impression Statement  Pt demonstrates overall improved low back pain with decreased duration of symptoms and decreased need for use of medication based on subjective reports  10/19/2019. Pt also demonstrates improved 6 minute walk distance compared to previous measurement without complain of bilateral anterior thigh pain and with less lateral lean compensation observed during stance phase. Continued working on improving glute med and max strength and improving quadriceps flexibility to help decrease pain and symptoms. Pt will benefit from continued skilled physical therapy services to decrease pain, improve strength, and function.    Personal Factors and Comorbidities  Age;Comorbidity 3+;Past/Current Experience;Time since onset of injury/illness/exacerbation    Comorbidities  DM, HTN, Parkinson's disease    Examination-Activity Limitations  Lift;Carry;Stand    Stability/Clinical Decision Making  Stable/Uncomplicated   pain seems to be worsening per pt subjective reports   Rehab Potential  Fair    PT Frequency  2x / week    PT Duration  6 weeks    PT Treatment/Interventions  Therapeutic activities;Therapeutic exercise;Neuromuscular re-education;Patient/family education;Manual techniques;Dry needling;Aquatic Therapy;Electrical Stimulation;Iontophoresis 14m/ml Dexamethasone    Consulted and Agree with Plan of Care  Patient       Patient will benefit from skilled therapeutic intervention in order to improve the following deficits and impairments:  Pain, Postural dysfunction, Improper body mechanics, Decreased strength  Visit  Diagnosis: Chronic left-sided low back pain without sciatica  Muscle weakness (generalized)     Problem List Patient Active Problem List   Diagnosis Date Noted  . Low back pain of over 3 months duration 08/19/2019  . Pulmonary hypertension ([place])   . Insomnia 12/21/2018  . Sleep apnea in adult 12/09/2018  . Periodic limb movement disorder 12/09/2018  . Abnormal findings on diagnostic imaging of lung 07/09/2018  . Lung nodule 07/09/2018  . Pulmonary nodules 05/18/2018  . Wears hearing aid in both ears 05/17/2018  . Leg pain, bilateral  02/20/2018  . Dyspnea   . Effort angina ([facility])   . CKD (chronic kidney disease) stage 3, GFR 30-59 ml/min 09/21/2017  . History of skin cancer in adulthood 08/14/2016  . Parkinson's disease ([place]) 08/09/2016  . Bilateral carotid artery stenosis 04/02/2015  . Vertigo, peripheral 10/17/2014  . Benign prostatic hypertrophy with urinary frequency 01/31/2014  . Encounter for Medicare annual wellness exam 07/02/2013  . Obesity 04/03/2013  . Other malaise and fatigue 09/21/2012  . Hyperlipidemia   . Hypertension   . 3-vessel coronary artery disease   . S/P CABG x 5   . 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  Joneen Boers PT, DPT   10/31/2019, 10:59 AM  [facility] [address], [place], [address] Phone: [phone]   Fax:  [phone]  Name: Comer Devins. MRN: [id number] Date of Birth: 1945-09-05

## 2019-11-02 ENCOUNTER — Ambulatory Visit: Payer: Medicare Other

## 2019-11-02 ENCOUNTER — Other Ambulatory Visit: Payer: Self-pay

## 2019-11-02 DIAGNOSIS — G8929 Other chronic pain: Secondary | ICD-10-CM | POA: Diagnosis not present

## 2019-11-02 DIAGNOSIS — M6281 Muscle weakness (generalized): Secondary | ICD-10-CM | POA: Diagnosis not present

## 2019-11-02 DIAGNOSIS — M545 Low back pain, unspecified: Secondary | ICD-10-CM

## 2019-11-02 NOTE — Therapy (Signed)
[facility] [address] [place], [address] Phone: [phone]   Fax:  [phone]  Physical Therapy Treatment  Patient Details  Name: Johnathan Arnold. MRN: [id number] Date of Birth: [birth year] Referring Provider (PT): Deborra Medina, MD   Encounter Date: 11/02/2019  PT End of Session - 11/02/19 0946    Visit Number  11    Number of Visits  25    Date for PT Re-Evaluation  12/01/19    Authorization Type  1    Authorization Time Period  of 10 progress report    PT Start Time  0946    PT Stop Time  1029    PT Time Calculation (min)  43 min    Activity Tolerance  Patient tolerated treatment well    Behavior During Therapy  [facility] for tasks assessed/performed       Past Medical History:  Diagnosis Date  . 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 Diabetes mellitus without complication ([place])   . History of cardiac catheterization 2011   [facility]  . Hyperlipidemia   . Hypertension   . Hypertriglyceridemia   . Parkinson's disease ([place])   . 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 Vertigo     Past Surgical History:  Procedure Laterality Date  . CARDIAC CATHETERIZATION  05-19-2010   [facility]: Patent grafts. LIMA to LAD, SVG to D1, OM1 and RPDA  . CORONARY ARTERY BYPASS GRAFT  03/1998   5 vessel, [facility]  . RIGHT HEART CATH N/A 04/04/2019   Procedure: RIGHT HEART CATH;  Surgeon: Wellington Hampshire, MD;  Location: [facility];  Service: Cardiovascular;  Laterality: N/A;  . RIGHT/LEFT HEART CATH AND CORONARY ANGIOGRAPHY N/A 02/01/2018   Procedure: RIGHT/LEFT HEART CATH AND CORONARY ANGIOGRAPHY;  Surgeon: Wellington Hampshire, MD;  Location: [place] [facility];  Service: Cardiovascular;  Laterality: N/A;    There were no vitals filed for this visit.  Subjective Assessment - 11/02/19 0947    Subjective  Back is a little sore this morning. The L posterior hip pain is gone. If his back hurts, its just in  the middle of his back. 1/10 low back soreness currently. Does not know if he slept good last night. Woke up with both calf muscles feeling weak this morning. Worked out at [employer] center at the [place]. The anterior thighs has not been as bad the last couple of sessions. The thigh symptoms are better than what it used to be.    Pertinent History  Chronic L low back pain without sciatica. Has a lot of weakness and soreness B LE. Had a back x-ray which revealed a bulging disc. 5-10 years ago, pt fracured lower back and ever since then his back would hurt when he is standing. Went to a Restaurant manager, fast food which helped a lot back then. However, nowadays, his back would hurt again the next day. Denies loss of bowel or bladder control. Denies LE paresthesia. No falls within the past 6 months. No fear of falling. Next follow up appointment with Dr. Derrel Nip is in maybe 6 months.  Goes to heart track 3 days a week to work out on some of the machines there.  Pt adds that he has Parkinson's. Currently participates in [employer] for his Parkinson's as recommended by his doctor.  Feels like he has not strength in his legs since 3-4  years ago. Has been checked by his cardiologist. Also talked to his neurologist (Dr. Carles Collet) about it who did not say anything.    Patient Stated Goals  Get rid of the pain. See if PT can help with his leg soress and weakness.    Currently in Pain?  Yes    Pain Score  1     Pain Descriptors / Indicators  Sore    Pain Onset  More than a month ago                                PT Education - 11/02/19 0953    Education Details  ther-ex    Person(s) Educated  Patient    Methods  Explanation;Demonstration;Tactile cues;Verbal cues    Comprehension  Returned demonstration;Verbalized understanding         Objectives   MedbridgeAccess Code [email or web address]   Hx ofold mildL1 compressiondeformityfx based on radiograph 02/17/18  Walking  about 30 seconds bothers his anterior thigh muscles, during inclines  Posture: R lateral shift, R lumbar rotation    Last scheduled visit: 11/02/2019  Manual therapy prone STM R lumbar paraspinal muscle to decrease tension     Therapeutic exercise  Blood pressure L arm sitting, mechanically taken, normal cuff: 159/52, HR 46at start of session   Physioball rolls to decrease low back pressure  Flexion 10x5 seconds. B proximal anterior thigh tightness  Seated manually resisted L lateral shift isometrics in neutral to counter R lateral shift posture 10x3 with 5 second holds   Prone self quad stretch with streap. Stretch is similar feeling to his anterior thigh symptoms during long distance gait R 30 seconds x 3 L 30 seconds x 3 Reviewed and given as part pf his HEP. Pt demonstrated and verbalized understanding. Handout provided.   Prone glute max extension   R 5x  L 5x   Gait at incline mulch surface in garden area 1:30 min. Slight B anterior thigh symptoms.   Seated B scapular retraction 10x5 seconds to promote thoracic extension    Improved exercise technique, movement at target joints, use of target muscles after mod verbal, visual, tactile cues.    Response to treatment Pt tolerated session well without aggravation of symptoms.   Clinical impression Centralizing symptoms and decreasing B anterior thigh symptoms based on subjective reports. Continued working on improving posture, thoracic extension, and quadriceps flexibility. Pt tolerated session well without aggravation of symptoms. Pt will benefit from continued skilled physical therapy services to decrease pain, improve strength and function.      PT Short Term Goals - 10/17/19 0956      PT SHORT TERM GOAL #1   Title  Pt will be independent with his HEP to decrease pain, improve strength and function.    Baseline  Pt performing his HEP based on subjective  reports (10/17/2019)    Time  3    Period  Weeks    Status  Achieved    Target Date  10/20/19        PT Long Term Goals - 10/31/19 0955      PT LONG TERM GOAL #1   Title  Patient will have a decrease in low back pain to 2/10 or less at worst to promote ability to lift items as well as perform standing tasks more comfortably.    Baseline  5/10 low back pain at most for the past 3 months (09/07/2019); 2-3/10 at  worst for the past 7 days (10/17/2019); 5/10 at most for the past 7 days, yesterday (10/19/2019); 4/10 at most past 7 days (10/31/2019)    Time  6    Period  Weeks    Status  Partially Met    Target Date  12/01/19      PT LONG TERM GOAL #2   Title  Patient will improve bilateral hip extension and abduction strength by at least 1/2 MMT grade to promote ability to perform standing tasks more comfortably.    Baseline  hip extension 4-/5 R, and L; hip abduction 4/5 R, and L (09/07/2019); Not tested today secondary to elevated blood pressure levels (10/19/2019); Hip extension 4-/5 R, 4/5 L, hip abduction 4/5 R and L (10/31/2019)    Time  6    Period  Weeks    Status  On-going    Target Date  12/01/19      PT LONG TERM GOAL #3   Title  Pt will improve lumbar FOTO score by at least 10 points as a demonstration of improved function.    Baseline  lumbar FOTO 58 (09/07/2019); 53 (10/31/2019)    Time  6    Period  Weeks    Status  On-going    Target Date  12/01/19      PT LONG TERM GOAL #4   Title  Pt will improve  6 minute walk test distance to 1328 ft or more to promote ability to ambulate further.    Baseline  1228 ft with 1/10 central low back pain (09/07/2019); 1037 ft (10/17/2019)    Time  6    Period  Weeks    Status  On-going    Target Date  12/01/19            Plan - 11/02/19 0953    Clinical Impression Statement  Centralizing symptoms and decreasing B anterior thigh symptoms based on subjective reports. Continued working on improving posture, thoracic extension, and  quadriceps flexibility. Pt tolerated session well without aggravation of symptoms. Pt will benefit from continued skilled physical therapy services to decrease pain, improve strength and function.    Personal Factors and Comorbidities  Age;Comorbidity 3+;Past/Current Experience;Time since onset of injury/illness/exacerbation    Comorbidities  DM, HTN, Parkinson's disease    Examination-Activity Limitations  Lift;Carry;Stand    Stability/Clinical Decision Making  Stable/Uncomplicated   pain seems to be worsening per pt subjective reports   Rehab Potential  Fair    PT Frequency  2x / week    PT Duration  6 weeks    PT Treatment/Interventions  Therapeutic activities;Therapeutic exercise;Neuromuscular re-education;Patient/family education;Manual techniques;Dry needling;Aquatic Therapy;Electrical Stimulation;Iontophoresis 78m/ml Dexamethasone    Consulted and Agree with Plan of Care  Patient       Patient will benefit from skilled therapeutic intervention in order to improve the following deficits and impairments:  Pain, Postural dysfunction, Improper body mechanics, Decreased strength  Visit Diagnosis: Chronic left-sided low back pain without sciatica  Muscle weakness (generalized)     Problem List Patient Active Problem List   Diagnosis Date Noted  . Low back pain of over 3 months duration 08/19/2019  . [place]   . Insomnia 12/21/2018  . Sleep apnea in adult 12/09/2018  . Periodic limb movement disorder 12/09/2018  . Abnormal findings on diagnostic imaging of lung 07/09/2018  . Lung nodule 07/09/2018  . Pulmonary nodules 05/18/2018  . Wears hearing aid in both ears 05/17/2018  . Leg pain, bilateral 02/20/2018  .  Dyspnea   . Effort angina (HCC)   . CKD (chronic kidney disease) stage 3, GFR 30-59 ml/min 09/21/2017  . History of skin cancer in adulthood 08/14/2016  . Parkinson's disease ([place]) 08/09/2016  . Bilateral carotid artery stenosis 04/02/2015  .  Vertigo, peripheral 10/17/2014  . Benign prostatic hypertrophy with urinary frequency 01/31/2014  . Encounter for Medicare annual wellness exam 07/02/2013  . Obesity 04/03/2013  . Other malaise and fatigue 09/21/2012  . Hyperlipidemia   . Hypertension   . 3-vessel coronary artery disease   . S/P CABG x 5   . 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 [place], [address] Phone: [phone]   Fax:  [phone]  Name: Johnathan Arnold. MRN: [id number] Date of Birth: 02/06/1946

## 2019-11-02 NOTE — Patient Instructions (Signed)
Access Code: [email or web address] URL: [email or web address] Date: 09/22/2019 Prepared by: Joneen Boers  Exercises Seated Hip Internal Rotation AROM - 3 x daily - 7 x weekly - 3 sets - 10 reps Seated Scapular Retraction - 1 x daily - 7 x weekly - 3 sets - 10 reps - 5 seconds hold Standing Single Leg Stance with Counter Support - 1 x daily - 7 x weekly - 3 sets - 10 reps - 5 seconds hold Sidelying Hip Abduction - 3 x daily - 7 x weekly - 1 sets - 10 reps Prone Quadriceps Set - 1 x daily - 7 x weekly - 3 sets - 10 reps Prone Quadriceps Stretch with Strap - 3 x daily - 7 x weekly - 1 sets - 3 reps - 30 seconds hold

## 2019-11-08 ENCOUNTER — Other Ambulatory Visit: Payer: Self-pay | Admitting: Cardiovascular Disease

## 2019-11-08 DIAGNOSIS — M79605 Pain in left leg: Secondary | ICD-10-CM

## 2019-11-08 DIAGNOSIS — M79604 Pain in right leg: Secondary | ICD-10-CM

## 2019-11-09 ENCOUNTER — Other Ambulatory Visit: Payer: Self-pay

## 2019-11-09 ENCOUNTER — Ambulatory Visit: Payer: Medicare Other | Attending: Internal Medicine

## 2019-11-09 DIAGNOSIS — M545 Low back pain: Secondary | ICD-10-CM | POA: Diagnosis present

## 2019-11-09 DIAGNOSIS — G8929 Other chronic pain: Secondary | ICD-10-CM | POA: Diagnosis present

## 2019-11-09 DIAGNOSIS — M6281 Muscle weakness (generalized): Secondary | ICD-10-CM | POA: Insufficient documentation

## 2019-11-09 NOTE — Therapy (Signed)
[facility] [address], [place], [address] Phone: [phone]   Fax:  [phone]  Physical Therapy Treatment  Patient Details  Name: Johnathan Arnold. MRN: [id number] Date of Birth: Dec 27, 1945 Referring Provider (PT): Deborra Medina, MD   Encounter Date: 11/09/2019  PT End of Session - 11/09/19 1120    Visit Number  12    Number of Visits  25    Date for PT Re-Evaluation  12/01/19    Authorization Type  2    Authorization Time Period  of 10 progress report    PT Start Time  1120    PT Stop Time  1204    PT Time Calculation (min)  44 min    Activity Tolerance  Patient tolerated treatment well    Behavior During Therapy  W[facility] for tasks assessed/performed       Past Medical History:  Diagnosis Date  . 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 Diabetes mellitus without complication ([place])   . History of cardiac catheterization 2011   [facility]  . Hyperlipidemia   . Hypertension   . Hypertriglyceridemia   . Parkinson's disease ([place])   . 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 Vertigo     Past Surgical History:  Procedure Laterality Date  . CARDIAC CATHETERIZATION  05-19-2010   [facility]: Patent grafts. LIMA to LAD, SVG to D1, OM1 and RPDA  . CORONARY ARTERY BYPASS GRAFT  03/1998   5 vessel, [facility]  . RIGHT HEART CATH N/A 04/04/2019   Procedure: RIGHT HEART CATH;  Surgeon: Wellington Hampshire, MD;  Location: [facility] [facility];  Service: Cardiovascular;  Laterality: N/A;  . RIGHT/LEFT HEART CATH AND CORONARY ANGIOGRAPHY N/A 02/01/2018   Procedure: RIGHT/LEFT HEART CATH AND CORONARY ANGIOGRAPHY;  Surgeon: Wellington Hampshire, MD;  Location: [facility];  Service: Cardiovascular;  Laterality: N/A;    There were no vitals filed for this visit.  Subjective Assessment - 11/09/19 1124    Subjective  Back has been doing pretty good. Stil centrallizes to low back and not in the posterior hip. 4-5/10  at most for the past 2 weeks (mowing the yard, bending over). The bilateral anterior thigh soreness is a lot better. Has a lot of problems wiht restless legs at night. His neurologist gave hip clozopam at night time. Legs are not restless when he falls asleep quickly, restless when he does not.    Pertinent History  Chronic L low back pain without sciatica. Has a lot of weakness and soreness B LE. Had a back x-ray which revealed a bulging disc. 5-10 years ago, pt fracured lower back and ever since then his back would hurt when he is standing. Went to a Restaurant manager, fast food which helped a lot back then. However, nowadays, his back would hurt again the next day. Denies loss of bowel or bladder control. Denies LE paresthesia. No falls within the past 6 months. No fear of falling. Next follow up appointment with Dr. Derrel Nip is in maybe 6 months.  Goes to heart track 3 days a week to work out on some of the machines there.  Pt adds that he has Parkinson's. Currently participates in [employer] for his Parkinson's as recommended by his doctor.  Feels like he has not strength in his legs since 3-4 years ago. Has been checked by his cardiologist. Also talked to his neurologist (Dr. Carles Collet) about  it who did not say anything.    Patient Stated Goals  Get rid of the pain. See if PT can help with his leg soress and weakness.    Currently in Pain?  No/denies    Pain Score  0-No pain    Pain Onset  More than a month ago                                PT Education - 11/09/19 1122    Education Details  ther-ex    Person(s) Educated  Patient    Methods  Explanation;Demonstration;Tactile cues;Verbal cues    Comprehension  Returned demonstration;Verbalized understanding        Objectives   MedbridgeAccess Code [email or web address]   Hx ofold mildL1 compressiondeformityfx based on radiograph 02/17/18  Walking about 30 seconds bothers his anterior thigh muscles, during inclines  Posture: R  lateral shift, R lumbar rotation    Last scheduled visit: 11/02/2019  Manual therapy seated STM R lumbar paraspinal muscle to decrease tension  Seated STM to lumbar fascia to improve movement    Therapeutic exercise  Blood pressure L arm sitting, mechanically taken, normal cuff: 180/52, HR 45at start of session   Standing quad stretch with B UE assist   R 30 seconds x 3   L 30 seconds x 3  After manual therapy: Blood pressure L arm sitting, mechanically taken, normal cuff: 150/55, HR 44  With B shoulder extension with scapular retraction red band to promote scapular strength, trunk strength, thoracic extension (to decrease stress to low back)  SLS (to promote glute med strength)   R 5x5 seconds for 3 sets   L 5x5 seconds for 3 sets   Seated manually resisted L lateral shift isometrics in neutral to counter R lateral shift posture 10x3 with 5 second holds    Improved exercise technique, movement at target joints, use of target muscles after mod verbal, visual, tactile cues.   Response to treatment Pt tolerated session well without aggravation of symptoms.   Clinical impression Pt continues to improve with anterior bilateral thigh symptoms while performing aggravating activities such as ambulation on incline surfaces as well as ability to maintain centralization of L low back and posterior hip symptoms to low back based on subjective reports. Continued working on improving glute and trunk strength, thoracic extension and decreasing soft tissue restrictions to lumbar area to decrease stress to low back with standing activities. Pt tolerated session well without aggravation of symptoms. Pt will benefit from continued skilled physical therapy services to decrease pain, improve strength and function.       PT Short Term Goals - 10/17/19 0956      PT SHORT TERM GOAL #1   Title  Pt will be independent with his HEP to decrease pain, improve  strength and function.    Baseline  Pt performing his HEP based on subjective reports (10/17/2019)    Time  3    Period  Weeks    Status  Achieved    Target Date  10/20/19        PT Long Term Goals - 10/31/19 0955      PT LONG TERM GOAL #1   Title  Patient will have a decrease in low back pain to 2/10 or less at worst to promote ability to lift items as well as perform standing tasks more comfortably.    Baseline  5/10 low back pain at  most for the past 3 months (09/07/2019); 2-3/10 at worst for the past 7 days (10/17/2019); 5/10 at most for the past 7 days, yesterday (10/19/2019); 4/10 at most past 7 days (10/31/2019)    Time  6    Period  Weeks    Status  Partially Met    Target Date  12/01/19      PT LONG TERM GOAL #2   Title  Patient will improve bilateral hip extension and abduction strength by at least 1/2 MMT grade to promote ability to perform standing tasks more comfortably.    Baseline  hip extension 4-/5 R, and L; hip abduction 4/5 R, and L (09/07/2019); Not tested today secondary to elevated blood pressure levels (10/19/2019); Hip extension 4-/5 R, 4/5 L, hip abduction 4/5 R and L (10/31/2019)    Time  6    Period  Weeks    Status  On-going    Target Date  12/01/19      PT LONG TERM GOAL #3   Title  Pt will improve lumbar FOTO score by at least 10 points as a demonstration of improved function.    Baseline  lumbar FOTO 58 (09/07/2019); 53 (10/31/2019)    Time  6    Period  Weeks    Status  On-going    Target Date  12/01/19      PT LONG TERM GOAL #4   Title  Pt will improve  6 minute walk test distance to 1328 ft or more to promote ability to ambulate further.    Baseline  1228 ft with 1/10 central low back pain (09/07/2019); 1037 ft (10/17/2019)    Time  6    Period  Weeks    Status  On-going    Target Date  12/01/19            Plan - 11/09/19 1122    Clinical Impression Statement  Pt continues to improve with anterior bilateral thigh symptoms while performing  aggravating activities such as ambulation on incline surfaces as well as ability to maintain centralization of L low back and posterior hip symptoms to low back based on subjective reports. Continued working on improving glute and trunk strength, thoracic extension and decreasing soft tissue restrictions to lumbar area to decrease stress to low back with standing activities. Pt tolerated session well without aggravation of symptoms. Pt will benefit from continued skilled physical therapy services to decrease pain, improve strength and function.    Personal Factors and Comorbidities  Age;Comorbidity 3+;Past/Current Experience;Time since onset of injury/illness/exacerbation    Comorbidities  DM, HTN, Parkinson's disease    Examination-Activity Limitations  Lift;Carry;Stand    Stability/Clinical Decision Making  Stable/Uncomplicated   pain seems to be worsening per pt subjective reports   Rehab Potential  Fair    PT Frequency  2x / week    PT Duration  6 weeks    PT Treatment/Interventions  Therapeutic activities;Therapeutic exercise;Neuromuscular re-education;Patient/family education;Manual techniques;Dry needling;Aquatic Therapy;Electrical Stimulation;Iontophoresis 11m/ml Dexamethasone    Consulted and Agree with Plan of Care  Patient       Patient will benefit from skilled therapeutic intervention in order to improve the following deficits and impairments:  Pain, Postural dysfunction, Improper body mechanics, Decreased strength  Visit Diagnosis: Chronic left-sided low back pain without sciatica  Muscle weakness (generalized)     Problem List Patient Active Problem List   Diagnosis Date Noted  . Low back pain of over 3 months duration 08/19/2019  . Pulmonary [place] ([place]   .  Insomnia 12/21/2018  . Sleep apnea in adult 12/09/2018  . Periodic limb movement disorder 12/09/2018  . Abnormal findings on diagnostic imaging of lung 07/09/2018  . Lung nodule 07/09/2018  . Pulmonary  nodules 05/18/2018  . Wears hearing aid in both ears 05/17/2018  . Leg pain, bilateral 02/20/2018  . Dyspnea   . Effort angina ([facility])   . CKD (chronic kidney disease) stage 3, GFR 30-59 ml/min 09/21/2017  . History of skin cancer in adulthood 08/14/2016  . Parkinson's disease ([place]) 08/09/2016  . Bilateral carotid artery stenosis 04/02/2015  . Vertigo, peripheral 10/17/2014  . Benign prostatic hypertrophy with urinary frequency 01/31/2014  . Encounter for Medicare annual wellness exam 07/02/2013  . Obesity 04/03/2013  . Other malaise and fatigue 09/21/2012  . Hyperlipidemia   . Hypertension   . 3-vessel coronary artery disease   . S/P CABG x 5   . 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 [place], [address] Phone: [phone]   Fax:  [phone]  Name: Sheehan Stacey. MRN: [id number] Date of Birth: Nov 17, 1945

## 2019-11-10 ENCOUNTER — Ambulatory Visit: Payer: Medicare Other | Admitting: Pulmonary Disease

## 2019-11-10 ENCOUNTER — Encounter: Payer: Self-pay | Admitting: Pulmonary Disease

## 2019-11-10 VITALS — BP 144/60 | HR 56 | Temp 97.3°F | Ht 67.0 in | Wt 191.2 lb

## 2019-11-10 DIAGNOSIS — G4730 Sleep apnea, unspecified: Secondary | ICD-10-CM

## 2019-11-10 DIAGNOSIS — G20 Parkinson's disease: Secondary | ICD-10-CM

## 2019-11-10 DIAGNOSIS — G4761 Periodic limb movement disorder: Secondary | ICD-10-CM

## 2019-11-10 DIAGNOSIS — R911 Solitary pulmonary nodule: Secondary | ICD-10-CM

## 2019-11-10 NOTE — Progress Notes (Signed)
    Assessment & Plan:  1. Solitary pulmonary nodule (Primary) - CT Chest Wo Contrast; Future  2. Parkinson's disease (HCC)  3. Periodic limb movement disorder  4. Sleep apnea in adult   Patient Instructions  We will repeat another chest CT we will call you with results.   We will see her in follow-up in 6 months time  Please note: late entry documentation due to logistical difficulties during COVID-19 pandemic. This note is filed for information purposes only, and is not intended to be used for billing, nor does it represent the full scope/nature of the visit in question. Please see any associated scanned media linked to date of encounter for additional pertinent information.  Subjective:    HPI: Johnathan Arnold. is a 74 y.o. male presenting to the pulmonology clinic on 11/10/2019 with report of: Follow-up (patient feels good overall. No issues or concerns at this time. States he is sleeping good, breathing good and no issues with CPAP machine)     Outpatient Encounter Medications as of 11/10/2019  Medication Sig Note   Multiple Vitamin (MULTIVITAMIN) tablet Take 1 tablet by mouth daily.    polyethylene glycol (MIRALAX  / GLYCOLAX ) packet Take 17 g by mouth daily as needed. 07/09/2023: prn   [DISCONTINUED] aspirin  EC 81 MG tablet Take 162 mg by mouth at bedtime.    [DISCONTINUED] carbidopa -levodopa  (SINEMET  CR) 50-200 MG tablet TAKE ONE TABLET BY MOUTH AT BEDTIME    [DISCONTINUED] carbidopa -levodopa  (SINEMET  IR) 25-100 MG tablet Take 1.5 tablets by mouth 3 (three) times daily.    [DISCONTINUED] carvedilol  (COREG ) 6.25 MG tablet TAKE ONE TABLET BY MOUTH TWICE A DAY    [DISCONTINUED] clonazePAM  (KLONOPIN ) 0.5 MG tablet TAKE 1.5 TABLETS BY MOUTH EVERY NIGHT AT BEDTIME    [DISCONTINUED] diazepam  (VALIUM ) 5 MG tablet Take 1 tablet (5 mg total) by mouth every 8 (eight) hours as needed for anxiety (or vertigo).    [DISCONTINUED] furosemide  (LASIX ) 20 MG tablet Take 1 tablet (20 mg  total) by mouth daily.    [DISCONTINUED] isosorbide  mononitrate (IMDUR ) 60 MG 24 hr tablet TAKE ONE TABLET BY MOUTH DAILY    [DISCONTINUED] losartan  (COZAAR ) 25 MG tablet TAKE ONE TABLET BY MOUTH DAILY    [DISCONTINUED] nitroGLYCERIN  (NITROSTAT ) 0.4 MG SL tablet Place 1 tablet (0.4 mg total) under the tongue every 5 (five) minutes as needed for chest pain.    [DISCONTINUED] Omega-3 Fatty Acids (FISH OIL) 1200 MG CAPS Take 1,200 mg by mouth daily.  (Patient not taking: Reported on 12/14/2020)    [DISCONTINUED] omeprazole  (PRILOSEC) 20 MG capsule TAKE ONE CAPSULE BY MOUTH EVERY MORNING AS DIRECTED    [DISCONTINUED] ranolazine  (RANEXA ) 1000 MG SR tablet TAKE ONE TABLET BY MOUTH TWICE A DAY    [DISCONTINUED] rosuvastatin  (CRESTOR ) 10 MG tablet Take 1 tablet (10 mg total) by mouth daily.    [DISCONTINUED] tamsulosin  (FLOMAX ) 0.4 MG CAPS capsule Take 1 capsule (0.4 mg total) by mouth daily. (Patient taking differently: Take 0.4 mg by mouth at bedtime. )    No facility-administered encounter medications on file as of 11/10/2019.      Objective:   Vitals:   11/10/19 1032  BP: (!) 144/60  Pulse: (!) 56  Temp: (!) 97.3 F (36.3 C)  Height: 5' 7 (1.702 m)  Weight: 191 lb 3.2 oz (86.7 kg)  SpO2: 98%  TempSrc: Temporal  BMI (Calculated): 29.94     Physical exam documentation is limited by delayed entry of information.

## 2019-11-10 NOTE — Patient Instructions (Signed)
We will repeat another chest CT we will call you with results.   We will see her in follow-up in 6 months time

## 2019-11-15 ENCOUNTER — Ambulatory Visit: Payer: Medicare Other

## 2019-11-15 ENCOUNTER — Other Ambulatory Visit: Payer: Self-pay

## 2019-11-15 DIAGNOSIS — M545 Low back pain, unspecified: Secondary | ICD-10-CM

## 2019-11-15 DIAGNOSIS — M6281 Muscle weakness (generalized): Secondary | ICD-10-CM

## 2019-11-15 DIAGNOSIS — G8929 Other chronic pain: Secondary | ICD-10-CM

## 2019-11-15 NOTE — Therapy (Signed)
[facility] [address], [place], [address] Phone: [phone]   Fax:  [phone]  Physical Therapy Treatment  Patient Details  Name: Johnathan Arnold. MRN: [id number] Date of Birth: [birth year] Referring Provider (PT): Deborra Medina, MD   Encounter Date: 11/15/2019  PT End of Session - 11/15/19 1706    Visit Number  13    Number of Visits  25    Date for PT Re-Evaluation  12/01/19    Authorization Type  3    Authorization Time Period  of 10 progress report    PT Start Time  1706    PT Stop Time  1748    PT Time Calculation (min)  42 min    Activity Tolerance  Patient tolerated treatment well    Behavior During Therapy  WFL for tasks assessed/performed       Past Medical History:  Diagnosis Date  . 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 Diabetes mellitus without complication ([place])   . History of cardiac catheterization 2011   [facility]  . Hyperlipidemia   . Hypertension   . Hypertriglyceridemia   . Parkinson's disease ([place])   . 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 Vertigo     Past Surgical History:  Procedure Laterality Date  . CARDIAC CATHETERIZATION  05-19-2010   [facility]: Patent grafts. LIMA to LAD, SVG to D1, OM1 and RPDA  . CORONARY ARTERY BYPASS GRAFT  03/1998   5 vessel, [facility]  . RIGHT HEART CATH N/A 04/04/2019   Procedure: RIGHT HEART CATH;  Surgeon: Wellington Hampshire, MD;  Location: [facility];  Service: Cardiovascular;  Laterality: N/A;  . RIGHT/LEFT HEART CATH AND CORONARY ANGIOGRAPHY N/A 02/01/2018   Procedure: RIGHT/LEFT HEART CATH AND CORONARY ANGIOGRAPHY;  Surgeon: Wellington Hampshire, MD;  Location: [facility];  Service: Cardiovascular;  Laterality: N/A;    There were no vitals filed for this visit.  Subjective Assessment - 11/15/19 1707    Subjective  Low back has been doing good. Has been moving clothes from the closet and bringing them upstairs,  back did not really bother him. No back pain currently. Feels a little tightness in the anterior thighs but not like what it used to.    Pertinent History  Chronic L low back pain without sciatica. Has a lot of weakness and soreness B LE. Had a back x-ray which revealed a bulging disc. 5-10 years ago, pt fracured lower back and ever since then his back would hurt when he is standing. Went to a Restaurant manager, fast food which helped a lot back then. However, nowadays, his back would hurt again the next day. Denies loss of bowel or bladder control. Denies LE paresthesia. No falls within the past 6 months. No fear of falling. Next follow up appointment with Dr. Derrel Nip is in maybe 6 months.  Goes to heart track 3 days a week to work out on some of the machines there.  Pt adds that he has Parkinson's. Currently participates in [employer] for his Parkinson's as recommended by his doctor.  Feels like he has not strength in his legs since 3-4 years ago. Has been checked by his cardiologist. Also talked to his neurologist (Dr. Carles Collet) about it who did not say anything.    Patient Stated Goals  Get rid of the pain. See if PT can help with his leg soress and weakness.  Currently in Pain?  No/denies    Pain Score  0-No pain    Pain Onset  More than a month ago                                PT Education - 11/15/19 1735    Education Details  ther-ex    Person(s) Educated  Patient    Methods  Explanation;Demonstration;Tactile cues;Verbal cues    Comprehension  Returned demonstration;Verbalized understanding       Objectives   MedbridgeAccess Code [email or web address]   Hx ofold mildL1 compressiondeformityfx based on radiograph 02/17/18  Walking about 30 seconds bothers his anterior thigh muscles, during inclines  Posture: R lateral shift, R lumbar rotation   Manual therapy seatedSTM R lumbar paraspinal muscle to decrease tension Seated STM to lumbar fascia to improve  movement  Seated STM R and L distal quadriceps muscle to decrease fascial restrictions    Therapeutic exercise  Blood pressure L arm sitting, mechanically taken, normal cuff: 171/55, HR 46at start of session   Standing ankle DF/PF on rocker board to promote gastroc flexibility 2 minutes    With PT Standing quad stretch with B UE assist              R 30 seconds x 3              L 30 seconds x 3   After manual therapy Blood pressure L arm sitting, mechanically taken, normal cuff: 178/55, HR 43  Unable to perform more exercises secondary to elevated blood pressure levels.      Improved exercise technique, movement at target joints, use of target muscles after min verbal, visual, tactile cues.   Response to treatment Pt tolerated session well without aggravation of symptoms.   Clinical impression Improving ability to perform standing tasks with decreased low back pain as well as decreased B anterior thigh pain. Pt able to ascend and descend stairs to the second floor at least 12 times while carrying a load with his back and B anterior thighs not really bothering him. Continued working on soft tissue techniques to decrease restrictions to low back and bilateral anterior thigh. Unable to perform more exercises today secondary to elevated blood pressure levels. Pt tolerated session well without aggravation of symptoms. Pt will benefit from continued skilled physical therapy services to decrease pain, improve strength and function.        PT Short Term Goals - 10/17/19 0956      PT SHORT TERM GOAL #1   Title  Pt will be independent with his HEP to decrease pain, improve strength and function.    Baseline  Pt performing his HEP based on subjective reports (10/17/2019)    Time  3    Period  Weeks    Status  Achieved    Target Date  10/20/19        PT Long Term Goals - 10/31/19 0955      PT LONG TERM GOAL #1   Title  Patient will have a  decrease in low back pain to 2/10 or less at worst to promote ability to lift items as well as perform standing tasks more comfortably.    Baseline  5/10 low back pain at most for the past 3 months (09/07/2019); 2-3/10 at worst for the past 7 days (10/17/2019); 5/10 at most for the past 7 days, yesterday (10/19/2019); 4/10 at most past 7 days (10/31/2019)  Time  6    Period  Weeks    Status  Partially Met    Target Date  12/01/19      PT LONG TERM GOAL #2   Title  Patient will improve bilateral hip extension and abduction strength by at least 1/2 MMT grade to promote ability to perform standing tasks more comfortably.    Baseline  hip extension 4-/5 R, and L; hip abduction 4/5 R, and L (09/07/2019); Not tested today secondary to elevated blood pressure levels (10/19/2019); Hip extension 4-/5 R, 4/5 L, hip abduction 4/5 R and L (10/31/2019)    Time  6    Period  Weeks    Status  On-going    Target Date  12/01/19      PT LONG TERM GOAL #3   Title  Pt will improve lumbar FOTO score by at least 10 points as a demonstration of improved function.    Baseline  lumbar FOTO 58 (09/07/2019); 53 (10/31/2019)    Time  6    Period  Weeks    Status  On-going    Target Date  12/01/19      PT LONG TERM GOAL #4   Title  Pt will improve  6 minute walk test distance to 1328 ft or more to promote ability to ambulate further.    Baseline  1228 ft with 1/10 central low back pain (09/07/2019); 1037 ft (10/17/2019)    Time  6    Period  Weeks    Status  On-going    Target Date  12/01/19            Plan - 11/15/19 1711    Clinical Impression Statement  Improving ability to perform standing tasks with decreased low back pain as well as decreased B anterior thigh pain. Pt able to ascend and descend stairs to the second floor at least 12 times while carrying a load with his back and B anterior thighs not really bothering him. Continued working on soft tissue techniques to decrease restrictions to low back and  bilateral anterior thigh. Unable to perform more exercises today secondary to elevated blood pressure levels. Pt tolerated session well without aggravation of symptoms. Pt will benefit from continued skilled physical therapy services to decrease pain, improve strength and function.    Personal Factors and Comorbidities  Age;Comorbidity 3+;Past/Current Experience;Time since onset of injury/illness/exacerbation    Comorbidities  DM, HTN, Parkinson's disease    Examination-Activity Limitations  Lift;Carry;Stand    Stability/Clinical Decision Making  Stable/Uncomplicated   pain seems to be worsening per pt subjective reports   Rehab Potential  Fair    PT Frequency  2x / week    PT Duration  6 weeks    PT Treatment/Interventions  Therapeutic activities;Therapeutic exercise;Neuromuscular re-education;Patient/family education;Manual techniques;Dry needling;Aquatic Therapy;Electrical Stimulation;Iontophoresis 54m/ml Dexamethasone    Consulted and Agree with Plan of Care  Patient       Patient will benefit from skilled therapeutic intervention in order to improve the following deficits and impairments:  Pain, Postural dysfunction, Improper body mechanics, Decreased strength  Visit Diagnosis: Chronic left-sided low back pain without sciatica  Muscle weakness (generalized)     Problem List Patient Active Problem List   Diagnosis Date Noted  . Low back pain of over 3 months duration 08/19/2019  . [place]   . Insomnia 12/21/2018  . Sleep apnea in adult 12/09/2018  . Periodic limb movement disorder 12/09/2018  . Abnormal findings on diagnostic imaging of lung  07/09/2018  . Lung nodule 07/09/2018  . Pulmonary nodules 05/18/2018  . Wears hearing aid in both ears 05/17/2018  . Leg pain, bilateral 02/20/2018  . Dyspnea   . Effort angina (HCC)   . CKD (chronic kidney disease) stage 3, GFR 30-59 ml/min 09/21/2017  . History of skin cancer in adulthood 08/14/2016  .  Parkinson's disease ([place]) 08/09/2016  . Bilateral carotid artery stenosis 04/02/2015  . Vertigo, peripheral 10/17/2014  . Benign prostatic hypertrophy with urinary frequency 01/31/2014  . Encounter for Medicare annual wellness exam 07/02/2013  . Obesity 04/03/2013  . Other malaise and fatigue 09/21/2012  . Hyperlipidemia   . Hypertension   . 3-vessel coronary artery disease   . S/P CABG x 5   . 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 [place], [address] Phone: [phone]   Fax:  [phone]  Name: Jerryl Holzhauer. MRN: [id number] Date of Birth: 1945-09-20

## 2019-11-16 ENCOUNTER — Ambulatory Visit (INDEPENDENT_AMBULATORY_CARE_PROVIDER_SITE_OTHER): Payer: Medicare Other

## 2019-11-16 DIAGNOSIS — M79604 Pain in right leg: Secondary | ICD-10-CM

## 2019-11-16 DIAGNOSIS — M79605 Pain in left leg: Secondary | ICD-10-CM

## 2019-11-17 ENCOUNTER — Ambulatory Visit: Payer: Medicare Other

## 2019-11-17 ENCOUNTER — Other Ambulatory Visit: Payer: Self-pay

## 2019-11-17 ENCOUNTER — Ambulatory Visit
Admission: RE | Admit: 2019-11-17 | Discharge: 2019-11-17 | Disposition: A | Discharge status: Home / Self Care | Payer: Medicare Other | Source: Ambulatory Visit | Attending: Pulmonary Disease | Admitting: Pulmonary Disease

## 2019-11-17 DIAGNOSIS — M545 Low back pain, unspecified: Secondary | ICD-10-CM

## 2019-11-17 DIAGNOSIS — R918 Other nonspecific abnormal finding of lung field: Secondary | ICD-10-CM | POA: Diagnosis not present

## 2019-11-17 DIAGNOSIS — R911 Solitary pulmonary nodule: Secondary | ICD-10-CM | POA: Insufficient documentation

## 2019-11-17 DIAGNOSIS — M6281 Muscle weakness (generalized): Secondary | ICD-10-CM

## 2019-11-17 DIAGNOSIS — G8929 Other chronic pain: Secondary | ICD-10-CM

## 2019-11-17 NOTE — Therapy (Signed)
[facility] [address] [place], [address] Phone: [phone]   Fax:  [phone]  Physical Therapy Treatment  Patient Details  Name: Johnathan Arnold. MRN: [id number] Date of Birth: [birth year] Referring Provider (PT): Deborra Medina, MD   Encounter Date: 11/17/2019   PT End of Session - 11/17/19 1718    Visit Number 14    Number of Visits 25    Date for PT Re-Evaluation 12/01/19    Authorization Type 4    Authorization Time Period of 10 progress report    PT Start Time 1719    PT Stop Time 1803    PT Time Calculation (min) 44 min    Activity Tolerance Patient tolerated treatment well    Behavior During Therapy WFL for tasks assessed/performed           Past Medical History:  Diagnosis Date  . 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 Diabetes mellitus without complication ([place])   . History of cardiac catheterization 2011   [facility]  . Hyperlipidemia   . Hypertension   . Hypertriglyceridemia   . Parkinson's disease ([place])   . 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 Vertigo     Past Surgical History:  Procedure Laterality Date  . CARDIAC CATHETERIZATION  05-19-2010   [facility]: Patent grafts. LIMA to LAD, SVG to D1, OM1 and RPDA  . CORONARY ARTERY BYPASS GRAFT  03/1998   5 vessel, [facility]  . RIGHT HEART CATH N/A 04/04/2019   Procedure: RIGHT HEART CATH;  Surgeon: Wellington Hampshire, MD;  Location: [facility];  Service: Cardiovascular;  Laterality: N/A;  . RIGHT/LEFT HEART CATH AND CORONARY ANGIOGRAPHY N/A 02/01/2018   Procedure: RIGHT/LEFT HEART CATH AND CORONARY ANGIOGRAPHY;  Surgeon: Wellington Hampshire, MD;  Location: [facility];  Service: Cardiovascular;  Laterality: N/A;    There were no vitals filed for this visit.   Subjective Assessment - 11/17/19 1721    Subjective Back is good. The thighs still bother him a little bit when he walks. Had a test yesterday, and his  blood flow in his legs is not really restricted. Had an aortic ultrasound which was ok, no aneurisms or anything.    Pertinent History Chronic L low back pain without sciatica. Has a lot of weakness and soreness B LE. Had a back x-ray which revealed a bulging disc. 5-10 years ago, pt fracured lower back and ever since then his back would hurt when he is standing. Went to a Restaurant manager, fast food which helped a lot back then. However, nowadays, his back would hurt again the next day. Denies loss of bowel or bladder control. Denies LE paresthesia. No falls within the past 6 months. No fear of falling. Next follow up appointment with Dr. Derrel Nip is in maybe 6 months.  Goes to heart track 3 days a week to work out on some of the machines there.  Pt adds that he has Parkinson's. Currently participates in [employer] for his Parkinson's as recommended by his doctor.  Feels like he has not strength in his legs since 3-4 years ago. Has been checked by his cardiologist. Also talked to his neurologist (Dr. Carles Collet) about it who did not say anything.    Patient Stated Goals Get rid of the pain. See if PT can help with his leg soress and weakness.    Currently in Pain? No/denies  Pain Onset More than a month ago              [facility] PT Assessment - 11/17/19 0001      Observation/Other Assessments   Observations 6 MWT 1249 ft with slight R low back and R distal quad tightness, no pain                                 PT Education - 11/17/19 1721    Education Details ther-ex    Person(s) Educated Patient    Methods Explanation;Demonstration;Tactile cues;Verbal cues    Comprehension Returned demonstration;Verbalized understanding          Objectives   MedbridgeAccess Code [email or web address]   Hx ofold mildL1 compressiondeformityfx based on radiograph 02/17/18  Walking about 30 seconds bothers his anterior thigh muscles, during inclines  Posture: R lateral shift, R lumbar  rotation   Manual therapy   proneSTM R lumbar paraspinal muscle to decrease tension prone STM to lumbar fascia to improve movement  Pt states the manual therapy felt good for his back    Therapeutic exercise  Blood pressure L arm sitting, mechanically taken, normal cuff: 161/51, HR 46at start of session   S/L hip abduction   R 10x3  L 10x3  Prone quadriceps stretch   R 1 min x 3  L 1 min x 3   Prone glute max extension   R 5x  L 5x  Gait x 6 minutes     1249 ft, slight R low back tightness, and slight distal R anterior thigh tighness did not feel like he was winded. Decreased overall lateral lean observed     Improved exercise technique, movement at target joints, use of target muscles after min verbal, visual, tactile cues.   Response to treatment Pt tolerated session well without aggravation of symptoms.   Clinical impression Continued working on improving bilateral glute med, max strength, quadriceps flexibility as well as decreasing soft tissue restrictions for his low back to decrease stress to low back during standing activities such as gait. Good muscle use observed during exercises. Pt able to ambulate up to 1249 ft with only slight R low back and R distal quadriceps tightness after 6 minutes. No complain of pain. Pt making progress with PT towards goals. Pt will benefit from continued skilled physical therapy services to decrease pain, soft tissue restrictions, improve trunk and glute strength, and function.           PT Short Term Goals - 10/17/19 0956      PT SHORT TERM GOAL #1   Title Pt will be independent with his HEP to decrease pain, improve strength and function.    Baseline Pt performing his HEP based on subjective reports (10/17/2019)    Time 3    Period Weeks    Status Achieved    Target Date 10/20/19             PT Long Term Goals - 10/31/19 0955      PT LONG TERM GOAL #1   Title Patient will  have a decrease in low back pain to 2/10 or less at worst to promote ability to lift items as well as perform standing tasks more comfortably.    Baseline 5/10 low back pain at most for the past 3 months (09/07/2019); 2-3/10 at worst for the past 7 days (10/17/2019); 5/10 at most for the past 7 days, yesterday (10/19/2019);  4/10 at most past 7 days (10/31/2019)    Time 6    Period Weeks    Status Partially Met    Target Date 12/01/19      PT LONG TERM GOAL #2   Title Patient will improve bilateral hip extension and abduction strength by at least 1/2 MMT grade to promote ability to perform standing tasks more comfortably.    Baseline hip extension 4-/5 R, and L; hip abduction 4/5 R, and L (09/07/2019); Not tested today secondary to elevated blood pressure levels (10/19/2019); Hip extension 4-/5 R, 4/5 L, hip abduction 4/5 R and L (10/31/2019)    Time 6    Period Weeks    Status On-going    Target Date 12/01/19      PT LONG TERM GOAL #3   Title Pt will improve lumbar FOTO score by at least 10 points as a demonstration of improved function.    Baseline lumbar FOTO 58 (09/07/2019); 53 (10/31/2019)    Time 6    Period Weeks    Status On-going    Target Date 12/01/19      PT LONG TERM GOAL #4   Title Pt will improve  6 minute walk test distance to 1328 ft or more to promote ability to ambulate further.    Baseline 1228 ft with 1/10 central low back pain (09/07/2019); 1037 ft (10/17/2019)    Time 6    Period Weeks    Status On-going    Target Date 12/01/19                 Plan - 11/17/19 1718    Clinical Impression Statement Continued working on improving bilateral glute med, max strength, quadriceps flexibility as well as decreasing soft tissue restrictions for his low back to decrease stress to low back during standing activities such as gait. Good muscle use observed during exercises. Pt able to ambulate up to 1249 ft with only slight R low back and R distal quadriceps tightness after 6  minutes. No complain of pain. Pt making progress with PT towards goals. Pt will benefit from continued skilled physical therapy services to decrease pain, soft tissue restrictions, improve trunk and glute strength, and function.    Personal Factors and Comorbidities Age;Comorbidity 3+;Past/Current Experience;Time since onset of injury/illness/exacerbation    Comorbidities DM, HTN, Parkinson's disease    Examination-Activity Limitations Lift;Carry;Stand    Stability/Clinical Decision Making Stable/Uncomplicated   pain seems to be worsening per pt subjective reports   Rehab Potential Fair    PT Frequency 2x / week    PT Duration 6 weeks    PT Treatment/Interventions Therapeutic activities;Therapeutic exercise;Neuromuscular re-education;Patient/family education;Manual techniques;Dry needling;Aquatic Therapy;Electrical Stimulation;Iontophoresis '4mg'$ /ml Dexamethasone    Consulted and Agree with Plan of Care Patient           Patient will benefit from skilled therapeutic intervention in order to improve the following deficits and impairments:  Pain, Postural dysfunction, Improper body mechanics, Decreased strength  Visit Diagnosis: Chronic left-sided low back pain without sciatica  Muscle weakness (generalized)     Problem List Patient Active Problem List   Diagnosis Date Noted  . Low back pain of over 3 months duration 08/19/2019  . Pulmonary hypertension ([place])   . Insomnia 12/21/2018  . Sleep apnea in adult 12/09/2018  . Periodic limb movement disorder 12/09/2018  . Abnormal findings on diagnostic imaging of lung 07/09/2018  . Lung nodule 07/09/2018  . Pulmonary nodules 05/18/2018  . Wears hearing aid in both ears  05/17/2018  . Leg pain, bilateral 02/20/2018  . Dyspnea   . Effort angina (H[facility])   . CKD (chronic kidney disease) stage 3, GFR 30-59 ml/min 09/21/2017  . History of skin cancer in adulthood 08/14/2016  . Parkinson's disease ([place]) 08/09/2016  . Bilateral carotid artery  stenosis 04/02/2015  . Vertigo, peripheral 10/17/2014  . Benign prostatic hypertrophy with urinary frequency 01/31/2014  . Encounter for Medicare annual wellness exam 07/02/2013  . Obesity 04/03/2013  . Other malaise and fatigue 09/21/2012  . Hyperlipidemia   . Hypertension   . 3-vessel coronary artery disease   . S/P CABG x 5   . 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, [place], [address] Phone: [phone]   Fax:  [phone]  Name: Johnathan Arnold. MRN: [id number] Date of Birth: 06-27-1945

## 2019-11-23 ENCOUNTER — Ambulatory Visit: Payer: Medicare Other

## 2019-11-23 ENCOUNTER — Other Ambulatory Visit: Payer: Self-pay

## 2019-11-23 DIAGNOSIS — M545 Low back pain, unspecified: Secondary | ICD-10-CM

## 2019-11-23 DIAGNOSIS — M6281 Muscle weakness (generalized): Secondary | ICD-10-CM

## 2019-11-23 DIAGNOSIS — G8929 Other chronic pain: Secondary | ICD-10-CM

## 2019-11-23 NOTE — Therapy (Signed)
[facility] [address] [place], [place], [address] Phone: [phone]   Fax:  [phone]  Physical Therapy Treatment  Patient Details  Name: Johnathan P Gilman Jr. MRN: [id number] Date of Birth: 09/21/1945 Referring Provider (PT): Teresa Tullo, MD   Encounter Date: 11/23/2019   PT End of Session - 11/23/19 1434    Visit Number 15    Number of Visits 25    Date for PT Re-Evaluation 12/01/19    Authorization Type 5    Authorization Time Period of 10 progress report    PT Start Time 1434    PT Stop Time 1515    PT Time Calculation (min) 41 min    Activity Tolerance Patient tolerated treatment well    Behavior During Therapy WFL for tasks assessed/performed            Past Medical History:  Diagnosis Date  . 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 Diabetes mellitus without complication ([facility])   . History of cardiac catheterization 2011   [facility]  . Hyperlipidemia   . Hypertension   . Hypertriglyceridemia   . Parkinson's disease ([facility])   . 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 Vertigo     Past Surgical History:  Procedure Laterality Date  . CARDIAC CATHETERIZATION  05-19-2010   [facility]: Patent grafts. LIMA to LAD, SVG to D1, OM1 and RPDA  . CORONARY ARTERY BYPASS GRAFT  03/1998   5 vessel, [place]  . RIGHT HEART CATH N/A 04/04/2019   Procedure: RIGHT HEART CATH;  Surgeon: Arida, Muhammad A, MD;  Location: [facility];  Service: Cardiovascular;  Laterality: N/A;  . RIGHT/LEFT HEART CATH AND CORONARY ANGIOGRAPHY N/A 02/01/2018   Procedure: RIGHT/LEFT HEART CATH AND CORONARY ANGIOGRAPHY;  Surgeon: Arida, Muhammad A, MD;  Location: [facility];  Service: Cardiovascular;  Laterality: N/A;    There were no vitals filed for this visit.   Subjective Assessment - 11/23/19 1438    Subjective Back is doing ok. Bothered him this past Saturday. Mowed the lawn with riding lawn mower. Did not do  any exercises afterwards.  Also used the push mower, climbed up and down the ladder to get tree limbs down. The anterior thighs bothered him just a little bit. 6/10 low back pain at most this past Saturday.  Back was ok other that Saturday or Sunday.  Heard back from his cardiologist and B LE circulation is good.    Pertinent History Chronic L low back pain without sciatica. Has a lot of weakness and soreness B LE. Had a back x-ray which revealed a bulging disc. 5-10 years ago, pt fracured lower back and ever since then his back would hurt when he is standing. Went to a chiropractor which helped a lot back then. However, nowadays, his back would hurt again the next day. Denies loss of bowel or bladder control. Denies LE paresthesia. No falls within the past 6 months. No fear of falling. Next follow up appointment with Dr. Tullo is in maybe 6 months.  Goes to heart track 3 days a week to work out on some of the machines there.  Pt adds that he has Parkinson's. Currently participates in [employer] for his Parkinson's as recommended by his doctor.  Feels like he has not strength in his legs since 3-4 years ago. Has been checked by his cardiologist. Also talked to his neurologist (Dr.   Tat) about it who did not say anything.    Patient Stated Goals Get rid of the pain. See if PT can help with his leg soress and weakness.    Currently in Pain? No/denies    Pain Score 0-No pain    Pain Onset More than a month ago                                     PT Education - 11/23/19 1508    Education Details ther-ex, HEP    Person(s) Educated Patient    Methods Explanation;Demonstration;Tactile cues;Verbal cues;Handout    Comprehension Returned demonstration;Verbalized understanding           Objectives   MedbridgeAccess Code [email or web address]   Hx ofold mildL1 compressiondeformityfx based on radiograph 02/17/18  Walking about 30 seconds bothers his anterior thigh muscles,  during inclines  Posture: R lateral shift, R lumbar rotation   Manual therapy  proneSTM R lumbar paraspinal muscle to decrease tension prone STM to lumbar fascia to improve movement                Therapeutic exercise  Blood pressure L arm sitting, mechanically taken, normal cuff: 139/51, HR 46at start of session   S/L hip abduction              R 10x3             L 10x3  Prone quadriceps stretch              R 1 min x 3             L 1 min x 3   Prone glute max extension              R 5x3             L 5x3  Supine posterior pelvic tilt 10x, then 10x5 seconds for 3 sets  Standing B scapular retraction with shoulder ext red band  10x5 seconds     Improved exercise technique, movement at target joints, use of target muscles afterminverbal, visual, tactile cues.   Response to treatment Pt tolerated session well without aggravation of symptoms.   Clinical impression Continued working on improving glute med and max strengthening as well as quadriceps/rectus femoris stretching thoracic extension and manual techniques to decrease soft tissue restrictions to low back to decrease low back stress during standing activities such as gait and garden work. Pt tolerated session well without aggravation of symptoms. Pt will benefit from continued skilled physical therapy services to decrease pain, LE symptoms, improve strength and function.       PT Short Term Goals - 10/17/19 0956      PT SHORT TERM GOAL #1   Title Pt will be independent with his HEP to decrease pain, improve strength and function.    Baseline Pt performing his HEP based on subjective reports (10/17/2019)    Time 3    Period Weeks    Status Achieved    Target Date 10/20/19             PT Long Term Goals - 10/31/19 0955      PT LONG TERM GOAL #1   Title Patient will have a decrease in low back pain to 2/10 or less at worst to promote ability to lift items as well  as perform standing tasks more comfortably.    Baseline   5/10 low back pain at most for the past 3 months (09/07/2019); 2-3/10 at worst for the past 7 days (10/17/2019); 5/10 at most for the past 7 days, yesterday (10/19/2019); 4/10 at most past 7 days (10/31/2019)    Time 6    Period Weeks    Status Partially Met    Target Date 12/01/19      PT LONG TERM GOAL #2   Title Patient will improve bilateral hip extension and abduction strength by at least 1/2 MMT grade to promote ability to perform standing tasks more comfortably.    Baseline hip extension 4-/5 R, and L; hip abduction 4/5 R, and L (09/07/2019); Not tested today secondary to elevated blood pressure levels (10/19/2019); Hip extension 4-/5 R, 4/5 L, hip abduction 4/5 R and L (10/31/2019)    Time 6    Period Weeks    Status On-going    Target Date 12/01/19      PT LONG TERM GOAL #3   Title Pt will improve lumbar FOTO score by at least 10 points as a demonstration of improved function.    Baseline lumbar FOTO 58 (09/07/2019); 53 (10/31/2019)    Time 6    Period Weeks    Status On-going    Target Date 12/01/19      PT LONG TERM GOAL #4   Title Pt will improve  6 minute walk test distance to 1328 ft or more to promote ability to ambulate further.    Baseline 1228 ft with 1/10 central low back pain (09/07/2019); 1037 ft (10/17/2019)    Time 6    Period Weeks    Status On-going    Target Date 12/01/19                 Plan - 11/23/19 1436    Clinical Impression Statement Continued working on improving glute med and max strengthening as well as quadriceps/rectus femoris stretching thoracic extension and manual techniques to decrease soft tissue restrictions to low back to decrease low back stress during standing activities such as gait and garden work. Pt tolerated session well without aggravation of symptoms. Pt will benefit from continued skilled physical therapy services to decrease pain, LE symptoms, improve strength and function.      Personal Factors and Comorbidities Age;Comorbidity 3+;Past/Current Experience;Time since onset of injury/illness/exacerbation    Comorbidities DM, HTN, Parkinson's disease    Examination-Activity Limitations Lift;Carry;Stand    Stability/Clinical Decision Making Stable/Uncomplicated   pain seems to be worsening per pt subjective reports   Rehab Potential Fair    PT Frequency 2x / week    PT Duration 6 weeks    PT Treatment/Interventions Therapeutic activities;Therapeutic exercise;Neuromuscular re-education;Patient/family education;Manual techniques;Dry needling;Aquatic Therapy;Electrical Stimulation;Iontophoresis 4mg/ml Dexamethasone    Consulted and Agree with Plan of Care Patient           Patient will benefit from skilled therapeutic intervention in order to improve the following deficits and impairments:  Pain, Postural dysfunction, Improper body mechanics, Decreased strength  Visit Diagnosis: Chronic left-sided low back pain without sciatica  Muscle weakness (generalized)     Problem List Patient Active Problem List   Diagnosis Date Noted  . Low back pain of over 3 months duration 08/19/2019  . Pulmonary hypertension ([facility])   . Insomnia 12/21/2018  . Sleep apnea in adult 12/09/2018  . Periodic limb movement disorder 12/09/2018  . Abnormal findings on diagnostic imaging of lung 07/09/2018  . Lung nodule 07/09/2018  . Pulmonary nodules 05/18/2018  . Wears   hearing aid in both ears 05/17/2018  . Leg pain, bilateral 02/20/2018  . Dyspnea   . Effort angina ([facility])   . CKD (chronic kidney disease) stage 3, GFR 30-59 ml/min 09/21/2017  . History of skin cancer in adulthood 08/14/2016  . Parkinson's disease ([place]) 08/09/2016  . Bilateral carotid artery stenosis 04/02/2015  . Vertigo, peripheral 10/17/2014  . Benign prostatic hypertrophy with urinary frequency 01/31/2014  . Encounter for Medicare annual wellness exam 07/02/2013  . Obesity 04/03/2013  . Other malaise and  fatigue 09/21/2012  . Hyperlipidemia   . Hypertension   . 3-vessel coronary artery disease   . S/P CABG x 5   . 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 [place], [address] Phone: [phone]   Fax:  [phone]  Name: Johnathan Arnold. MRN: [id number] Date of Birth: 1946/01/30

## 2019-11-23 NOTE — Patient Instructions (Signed)
Access Code: [email or web address] URL: [email or web address] Date: 11/23/2019 Prepared by: Joneen Boers  Exercises Seated Hip Internal Rotation AROM - 3 x daily - 7 x weekly - 3 sets - 10 reps Seated Scapular Retraction - 1 x daily - 7 x weekly - 3 sets - 10 reps - 5 seconds hold Standing Single Leg Stance with Counter Support - 1 x daily - 7 x weekly - 3 sets - 10 reps - 5 seconds hold Sidelying Hip Abduction - 3 x daily - 7 x weekly - 1 sets - 10 reps Prone Quadriceps Set - 1 x daily - 7 x weekly - 3 sets - 10 reps Prone Quadriceps Stretch with Strap - 3 x daily - 7 x weekly - 1 sets - 3 reps - 30 seconds hold Supine Posterior Pelvic Tilt - 1 x daily - 7 x weekly - 3 sets - 10 reps - 5 seconds hold

## 2019-11-29 ENCOUNTER — Ambulatory Visit: Payer: Medicare Other

## 2019-11-29 ENCOUNTER — Other Ambulatory Visit: Payer: Self-pay

## 2019-11-29 DIAGNOSIS — M545 Low back pain, unspecified: Secondary | ICD-10-CM

## 2019-11-29 DIAGNOSIS — G8929 Other chronic pain: Secondary | ICD-10-CM

## 2019-11-29 DIAGNOSIS — M6281 Muscle weakness (generalized): Secondary | ICD-10-CM

## 2019-11-29 NOTE — Therapy (Signed)
[facility] [address], [place], [address] Phone: [phone]   Fax:  [phone]  Physical Therapy Treatment  Patient Details  Name: Johnathan Arnold. MRN: [id number] Date of Birth: 06/27/1945 Referring Provider (PT): Deborra Medina, MD   Encounter Date: 11/29/2019   PT End of Session - 11/29/19 1719    Visit Number 16    Number of Visits 25    Date for PT Re-Evaluation 12/01/19    Authorization Type 6    Authorization Time Period of 10 progress report    PT Start Time 1720    PT Stop Time 1805    PT Time Calculation (min) 45 min    Activity Tolerance Patient tolerated treatment well    Behavior During Therapy [facility] for tasks assessed/performed           Past Medical History:  Diagnosis Date  . 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 Diabetes mellitus without complication ([place])   . History of cardiac catheterization 2011   [facility]  . Hyperlipidemia   . Hypertension   . Hypertriglyceridemia   . Parkinson's disease ([place])   . 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 Vertigo     Past Surgical History:  Procedure Laterality Date  . CARDIAC CATHETERIZATION  05-19-2010   [facility]: Patent grafts. LIMA to LAD, SVG to D1, OM1 and RPDA  . CORONARY ARTERY BYPASS GRAFT  03/1998   5 vessel, [facility]  . RIGHT HEART CATH N/A 04/04/2019   Procedure: RIGHT HEART CATH;  Surgeon: Wellington Hampshire, MD;  Location: [facility];  Service: Cardiovascular;  Laterality: N/A;  . RIGHT/LEFT HEART CATH AND CORONARY ANGIOGRAPHY N/A 02/01/2018   Procedure: RIGHT/LEFT HEART CATH AND CORONARY ANGIOGRAPHY;  Surgeon: Wellington Hampshire, MD;  Location: [facility];  Service: Cardiovascular;  Laterality: N/A;    There were no vitals filed for this visit.   Subjective Assessment - 11/29/19 1724    Subjective Back is a little sore, did a lot of moving, bending for the renovation. Not used to it anymore.  Pain  did not go down the hip. The bilateral anterior thighs are nothing like it used to. Did a lot of stair climbing. 5/10 back pain at worst for the past 7 days (centralized, no hip symptoms.)    Pertinent History Chronic L low back pain without sciatica. Has a lot of weakness and soreness B LE. Had a back x-ray which revealed a bulging disc. 5-10 years ago, pt fracured lower back and ever since then his back would hurt when he is standing. Went to a Restaurant manager, fast food which helped a lot back then. However, nowadays, his back would hurt again the next day. Denies loss of bowel or bladder control. Denies LE paresthesia. No falls within the past 6 months. No fear of falling. Next follow up appointment with Dr. Derrel Nip is in maybe 6 months.  Goes to heart track 3 days a week to work out on some of the machines there.  Pt adds that he has Parkinson's. Currently participates in [employer] for his Parkinson's as recommended by his doctor.  Feels like he has not strength in his legs since 3-4 years ago. Has been checked by his cardiologist. Also talked to his neurologist (Dr. Carles Collet) about it who did not say anything.    Patient Stated Goals Get rid of the pain. See if PT  can help with his leg soress and weakness.    Currently in Pain? No/denies    Pain Score 0-No pain    Pain Onset More than a month ago                                     PT Education - 11/29/19 1811    Education Details ther-ex, HEP    Person(s) Educated Patient    Methods Explanation;Demonstration;Tactile cues;Verbal cues    Comprehension Returned demonstration;Verbalized understanding          Objectives   MedbridgeAccess Code [email or web address]  No latex band allergies  Hx ofold mildL1 compressiondeformityfx based on radiograph 02/17/18  Walking about 30 seconds bothers his anterior thigh muscles, during inclines  Posture: R lateral shift, R lumbar rotation   Manual therapy  Seated STM  bilateral quadriceps to decrease muscle and fascial tension   Seated STM R medial knee to decrease discomfort with sit <> Stand. No change in medial knee discomfort with transfer   Therapeutic exercise  Blood pressure L arm sitting, mechanically taken, normal cuff: 149/50, HR 45at start of session  Seated B scapular retraction with transversus abdominis contraction 10x5 seconds then 10x10 seconds  Decreased R paraspinal muscle tension palpated lumbar area  Sit <> stand from chair 2x. R medial knee pain. Eases with rest.   Side stepping red band around thighs 32 ft to the R and 32 ft to the L 2x  Standing hip extension with red band around thighs B UE assist   R 10x3  L 10x3  Standing glute max squeeze 10x3 with 5 seconds. Hip flexor muscle stretch felt.    Standing B scapular retraction with shoulder extension red band 10x5 seconds for 2 sets    Improved exercise technique, movement at target joints, use of target muscles after mod verbal, visual, tactile cues.     Response to treatment Pt tolerated session well without aggravation of symptoms.   Clinical impression Pt continues to maintain centralization of low back symptoms with decreased bilateral anterior thigh discomfort with incline walking and stair negotiation based on subjective reports. Worked on transversus abdominis, glute med, max muscle strength, thoracic extension to decrease R lumbar paraspinal muscle tension as well as to decrease stress to low back when performing standing tasks. Pt tolerated session well without aggravation of symptoms. Pt will benefit from continued skilled physical therapy services to decrease pain, improve strength and function.       PT Short Term Goals - 10/17/19 0956      PT SHORT TERM GOAL #1   Title Pt will be independent with his HEP to decrease pain, improve strength and function.    Baseline Pt performing his HEP based on subjective reports (10/17/2019)    Time 3     Period Weeks    Status Achieved    Target Date 10/20/19             PT Long Term Goals - 10/31/19 0955      PT LONG TERM GOAL #1   Title Patient will have a decrease in low back pain to 2/10 or less at worst to promote ability to lift items as well as perform standing tasks more comfortably.    Baseline 5/10 low back pain at most for the past 3 months (09/07/2019); 2-3/10 at worst for the past 7 days (10/17/2019); 5/10 at most for the past  7 days, yesterday (10/19/2019); 4/10 at most past 7 days (10/31/2019)    Time 6    Period Weeks    Status Partially Met    Target Date 12/01/19      PT LONG TERM GOAL #2   Title Patient will improve bilateral hip extension and abduction strength by at least 1/2 MMT grade to promote ability to perform standing tasks more comfortably.    Baseline hip extension 4-/5 R, and L; hip abduction 4/5 R, and L (09/07/2019); Not tested today secondary to elevated blood pressure levels (10/19/2019); Hip extension 4-/5 R, 4/5 L, hip abduction 4/5 R and L (10/31/2019)    Time 6    Period Weeks    Status On-going    Target Date 12/01/19      PT LONG TERM GOAL #3   Title Pt will improve lumbar FOTO score by at least 10 points as a demonstration of improved function.    Baseline lumbar FOTO 58 (09/07/2019); 53 (10/31/2019)    Time 6    Period Weeks    Status On-going    Target Date 12/01/19      PT LONG TERM GOAL #4   Title Pt will improve  6 minute walk test distance to 1328 ft or more to promote ability to ambulate further.    Baseline 1228 ft with 1/10 central low back pain (09/07/2019); 1037 ft (10/17/2019)    Time 6    Period Weeks    Status On-going    Target Date 12/01/19                 Plan - 11/29/19 1722    Clinical Impression Statement Pt continues to maintain centralization of low back symptoms with decreased bilateral anterior thigh discomfort with incline walking and stair negotiation based on subjective reports. Worked on transversus  abdominis, glute med, max muscle strength, thoracic extension to decrease R lumbar paraspinal muscle tension as well as to decrease stress to low back when performing standing tasks. Pt tolerated session well without aggravation of symptoms. Pt will benefit from continued skilled physical therapy services to decrease pain, improve strength and function.    Personal Factors and Comorbidities Age;Comorbidity 3+;Past/Current Experience;Time since onset of injury/illness/exacerbation    Comorbidities DM, HTN, Parkinson's disease    Examination-Activity Limitations Lift;Carry;Stand    Stability/Clinical Decision Making Stable/Uncomplicated   pain seems to be worsening per pt subjective reports   Rehab Potential Fair    PT Frequency 2x / week    PT Duration 6 weeks    PT Treatment/Interventions Therapeutic activities;Therapeutic exercise;Neuromuscular re-education;Patient/family education;Manual techniques;Dry needling;Aquatic Therapy;Electrical Stimulation;Iontophoresis 2m/ml Dexamethasone    Consulted and Agree with Plan of Care Patient           Patient will benefit from skilled therapeutic intervention in order to improve the following deficits and impairments:  Pain, Postural dysfunction, Improper body mechanics, Decreased strength  Visit Diagnosis: Chronic left-sided low back pain without sciatica  Muscle weakness (generalized)     Problem List Patient Active Problem List   Diagnosis Date Noted  . Low back pain of over 3 months duration 08/19/2019  . Pulmonary hypertension ([place]   . Insomnia 12/21/2018  . Sleep apnea in adult 12/09/2018  . Periodic limb movement disorder 12/09/2018  . Abnormal findings on diagnostic imaging of lung 07/09/2018  . Lung nodule 07/09/2018  . Pulmonary nodules 05/18/2018  . Wears hearing aid in both ears 05/17/2018  . Leg pain, bilateral 02/20/2018  . Dyspnea   .  Effort angina ([facility])   . CKD (chronic kidney disease) stage 3, GFR 30-59 ml/min  09/21/2017  . History of skin cancer in adulthood 08/14/2016  . Parkinson's disease ([place]) 08/09/2016  . Bilateral carotid artery stenosis 04/02/2015  . Vertigo, peripheral 10/17/2014  . Benign prostatic hypertrophy with urinary frequency 01/31/2014  . Encounter for Medicare annual wellness exam 07/02/2013  . Obesity 04/03/2013  . Other malaise and fatigue 09/21/2012  . Hyperlipidemia   . Hypertension   . 3-vessel coronary artery disease   . S/P CABG x 5   . 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, [place], [address] Phone: [phone]   Fax:  [phone]  Name: Johnathan Arnold. MRN: [id number] Date of Birth: 1946/01/23

## 2019-11-29 NOTE — Patient Instructions (Signed)
Access Code: [email or web address] URL: [email or web address] Date: 11/29/2019 Prepared by: Joneen Boers  Exercises Seated Hip Internal Rotation AROM - 3 x daily - 7 x weekly - 3 sets - 10 reps Seated Scapular Retraction - 1 x daily - 7 x weekly - 3 sets - 10 reps - 5 seconds hold Standing Single Leg Stance with Counter Support - 1 x daily - 7 x weekly - 3 sets - 10 reps - 5 seconds hold Sidelying Hip Abduction - 3 x daily - 7 x weekly - 1 sets - 10 reps Prone Quadriceps Set - 1 x daily - 7 x weekly - 3 sets - 10 reps Prone Quadriceps Stretch with Strap - 3 x daily - 7 x weekly - 1 sets - 3 reps - 30 seconds hold Supine Posterior Pelvic Tilt - 1-2 x daily - 7 x weekly - 3 sets - 10 reps - 5 seconds hold Standing Gluteal Sets - 1 x daily - 7 x weekly - 3 sets - 10 reps - 5 seconds hold Scapular Retraction with Resistance Advanced - 1 x daily - 7 x weekly - 2 sets - 10 reps - 5 seconds hold

## 2019-12-01 ENCOUNTER — Ambulatory Visit: Payer: Medicare Other

## 2019-12-05 NOTE — Progress Notes (Signed)
Assessment/Plan:   1.  Parkinsons Disease  -Continue carbidopa/levodopa 25/100, 1.5 tablets 3 times daily.  Told him could 1/2 to 1 tablet prn if having bad day.  -Continue carbidopa/levodopa 50/200 CR at bedtime 2.  RLS and RBD  -Patient on clonazepam 0.5 mg, 1.5 tablets nightly, but on the 3 days that he goes to the gym, he only takes 1 tablet nightly so he does not get hangover effect  -will try to get labs from nephrology.  Looks like they drew ferritin and patient sees nephrology tomorrow with labs yesterday.  He is slightly anemic  ADDENDUM: we called nephrology and they did not order so we will order that 3.  OSAS  -Faithful with CPAP 4.  Pulmonary hypertension  -Following with cardiology and pulmonology   Subjective:   Johnathan Arnold. was seen today in follow up for Parkinsons disease.  My previous records were reviewed prior to todays visit as well as outside records available to me. Wife supplements history.  Pt/wife thinks that he is getting worse a little bit.  Pt denies falls.  Pt denies lightheadedness, near syncope.  No hallucinations.  Mood has been good.  Physical therapy records reviewed since last visit.  Was at physical therapy primarily for back pain radiating to left buttock.  Feels legs have been weak and cardiology changed him from simvastatin to crestor.  Still doing rsb 3 days/week.  Still going to gym 3 days per week.  Current prescribed movement disorder medications: Carbidopa/levodopa 25/100, 1.5 tablet 3 times per day (increased last visit) Carbidopa/levodopa 50/200 CR at bedtime  Clonazepam, 0.5 mg, 1.5 tablets nightly (on m/w/f he goes to the gym, he will only take 1 tablet so he can get up and go to the gym)   PREVIOUS MEDICATIONS: Levodopa; clonazepam  ALLERGIES:  No Known Allergies  CURRENT MEDICATIONS:  Outpatient Encounter Medications as of 12/06/2019  Medication Sig  . aspirin EC 81 MG tablet Take 162 mg by mouth at bedtime.  .  carbidopa-levodopa (SINEMET CR) 50-200 MG tablet TAKE ONE TABLET BY MOUTH AT BEDTIME  . carbidopa-levodopa (SINEMET IR) 25-100 MG tablet Take 1.5 tablets by mouth 3 (three) times daily.  . carvedilol (COREG) 6.25 MG tablet TAKE ONE TABLET BY MOUTH TWICE A DAY  . clonazePAM (KLONOPIN) 0.5 MG tablet TAKE 1.5 TABLETS BY MOUTH EVERY NIGHT AT BEDTIME  . diazepam (VALIUM) 5 MG tablet Take 1 tablet (5 mg total) by mouth every 8 (eight) hours as needed for anxiety (or vertigo).  . furosemide (LASIX) 20 MG tablet Take 1 tablet (20 mg total) by mouth daily.  . isosorbide mononitrate (IMDUR) 60 MG 24 hr tablet TAKE ONE TABLET BY MOUTH DAILY  . losartan (COZAAR) 25 MG tablet TAKE ONE TABLET BY MOUTH DAILY  . Multiple Vitamin (MULTIVITAMIN) tablet Take 1 tablet by mouth daily.    . nitroGLYCERIN (NITROSTAT) 0.4 MG SL tablet Place 1 tablet (0.4 mg total) under the tongue every 5 (five) minutes as needed for chest pain.  . Omega-3 Fatty Acids (FISH OIL) 1200 MG CAPS Take 1,200 mg by mouth daily.   Marland Kitchen omeprazole (PRILOSEC) 20 MG capsule TAKE ONE CAPSULE BY MOUTH EVERY MORNING AS DIRECTED  . polyethylene glycol (MIRALAX / GLYCOLAX) packet Take 17 g by mouth daily.   . ranolazine (RANEXA) 1000 MG SR tablet TAKE ONE TABLET BY MOUTH TWICE A DAY  . tamsulosin (FLOMAX) 0.4 MG CAPS capsule Take 1 capsule (0.4 mg total) by mouth daily. (Patient  taking differently: Take 0.4 mg by mouth at bedtime. )  . rosuvastatin (CRESTOR) 10 MG tablet Take 1 tablet (10 mg total) by mouth daily.   No facility-administered encounter medications on file as of 12/06/2019.    Objective:   PHYSICAL EXAMINATION:    VITALS:   Vitals:   12/06/19 1421 12/06/19 1424  BP: (!) 186/72 (!) 182/69  Pulse: (!) 56   SpO2: 97%   Weight: 193 lb (87.5 kg)   Height: 5\' 7"  (1.702 m)     GEN:  The patient appears stated age and is in NAD. HEENT:  Normocephalic, atraumatic.  The mucous membranes are moist. The superficial temporal arteries are  without ropiness or tenderness. CV:  RRR Lungs:  CTAB Neck/HEME:  There are no carotid bruits bilaterally.  Neurological examination:  Orientation: The patient is alert and oriented x3. Cranial nerves: There is good facial symmetry with facial hypomimia. The speech is fluent and clear. Soft palate rises symmetrically and there is no tongue deviation. Hearing is intact to conversational tone. Sensation: Sensation is intact to light touch throughout Motor: Strength is at least antigravity x4.  Movement examination: Tone: There is normal tone in the UE/LE Abnormal movements: there is RUE rest tremor Coordination:  There is no decremation with RAM's, with any form of RAMS, including alternating supination and pronation of the forearm, hand opening and closing, finger taps, heel taps and toe taps. Gait and Station: The patient has no difficulty arising out of a deep-seated chair without the use of the hands. The patient's stride length is good.    I have reviewed and interpreted the following labs independently    Chemistry      Component Value Date/Time   NA 138 08/17/2019 1055   NA 142 01/14/2018 1031   NA 138 03/15/2014 1005   K 4.8 08/17/2019 1055   K 4.4 03/15/2014 1005   CL 101 08/17/2019 1055   CL 108 (H) 03/15/2014 1005   CO2 32 08/17/2019 1055   CO2 30 03/15/2014 1005   BUN 27 (H) 08/17/2019 1055   BUN 16 01/14/2018 1031   BUN 12 03/15/2014 1005   CREATININE 2.00 (H) 08/17/2019 1055   CREATININE 1.76 (H) 02/20/2017 1045   GLU 129 10/02/2016 0000      Component Value Date/Time   CALCIUM 9.5 08/17/2019 1055   CALCIUM 9.1 03/15/2014 1005   ALKPHOS 48 08/17/2019 1055   AST 18 08/17/2019 1055   ALT 16 08/17/2019 1055   BILITOT 0.7 08/17/2019 1055       Lab Results  Component Value Date   WBC 4.0 03/31/2019   HGB 11.3 (L) 03/31/2019   HCT 33.4 (L) 03/31/2019   MCV 99.4 03/31/2019   PLT 137 (L) 03/31/2019    Lab Results  Component Value Date   TSH 2.70  08/17/2019   Lab Results  Component Value Date   IRON 121 02/12/2017   TIBC 295 02/12/2017   FERRITIN 138 02/12/2017     Total time spent on today's visit was 30 minutes, including both face-to-face time and nonface-to-face time.  Time included that spent on review of records (prior notes available to me/labs/imaging if pertinent), discussing treatment and goals, answering patient's questions and coordinating care.  Cc:  [name], MD

## 2019-12-06 ENCOUNTER — Other Ambulatory Visit (INDEPENDENT_AMBULATORY_CARE_PROVIDER_SITE_OTHER): Payer: Medicare Other

## 2019-12-06 ENCOUNTER — Other Ambulatory Visit: Payer: Self-pay

## 2019-12-06 ENCOUNTER — Ambulatory Visit (INDEPENDENT_AMBULATORY_CARE_PROVIDER_SITE_OTHER): Payer: Medicare Other | Admitting: Neurology

## 2019-12-06 ENCOUNTER — Ambulatory Visit: Payer: Medicare Other

## 2019-12-06 ENCOUNTER — Encounter: Payer: Self-pay | Admitting: Neurology

## 2019-12-06 VITALS — BP 182/69 | HR 56 | Ht 67.0 in | Wt 193.0 lb

## 2019-12-06 DIAGNOSIS — M6281 Muscle weakness (generalized): Secondary | ICD-10-CM

## 2019-12-06 DIAGNOSIS — G2581 Restless legs syndrome: Secondary | ICD-10-CM | POA: Diagnosis not present

## 2019-12-06 DIAGNOSIS — G20 Parkinson's disease: Secondary | ICD-10-CM | POA: Diagnosis not present

## 2019-12-06 DIAGNOSIS — I209 Angina pectoris, unspecified: Secondary | ICD-10-CM

## 2019-12-06 DIAGNOSIS — D509 Iron deficiency anemia, unspecified: Secondary | ICD-10-CM | POA: Diagnosis not present

## 2019-12-06 DIAGNOSIS — M545 Low back pain: Secondary | ICD-10-CM | POA: Diagnosis not present

## 2019-12-06 DIAGNOSIS — E1159 Type 2 diabetes mellitus with other circulatory complications: Secondary | ICD-10-CM | POA: Diagnosis not present

## 2019-12-06 DIAGNOSIS — G8929 Other chronic pain: Secondary | ICD-10-CM

## 2019-12-06 NOTE — Therapy (Signed)
[facility] [address], [place], [address] Phone: [phone]   Fax:  [phone]  Physical Therapy Treatment And Discharge Summary  Patient Details  Name: Johnathan Arnold. MRN: [id number] Date of Birth: August 24, 1945 Referring Provider (PT): Deborra Medina, MD   Encounter Date: 12/06/2019   PT End of Session - 12/06/19 1720    Visit Number 17    Number of Visits 25    Date for PT Re-Evaluation 12/06/19    Authorization Type 7    Authorization Time Period of 10 progress report    PT Start Time 1720    PT Stop Time 1810    PT Time Calculation (min) 50 min    Activity Tolerance Patient tolerated treatment well    Behavior During Therapy [facility] for tasks assessed/performed           Past Medical History:  Diagnosis Date  . 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 Diabetes mellitus without complication ([place])   . History of cardiac catheterization 2011   [facility]  . Hyperlipidemia   . Hypertension   . Hypertriglyceridemia   . Parkinson's disease ([place])   . 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 Vertigo     Past Surgical History:  Procedure Laterality Date  . CARDIAC CATHETERIZATION  05-19-2010   [facility]: Patent grafts. LIMA to LAD, SVG to D1, OM1 and RPDA  . CORONARY ARTERY BYPASS GRAFT  03/1998   5 vessel, [facility]  . RIGHT HEART CATH N/A 04/04/2019   Procedure: RIGHT HEART CATH;  Surgeon: Wellington Hampshire, MD;  Location: [facility];  Service: Cardiovascular;  Laterality: N/A;  . RIGHT/LEFT HEART CATH AND CORONARY ANGIOGRAPHY N/A 02/01/2018   Procedure: RIGHT/LEFT HEART CATH AND CORONARY ANGIOGRAPHY;  Surgeon: Wellington Hampshire, MD;  Location: [facility];  Service: Cardiovascular;  Laterality: N/A;    There were no vitals filed for this visit.   Subjective Assessment - 12/06/19 1721    Subjective Doing ok. The thighs are getting back to being tight and weak at the top, sore.  Has been doing part of the exerces. Did not feel like doing anything this past weekend. Yesterday was a bad day for Parkinson's. Also did not feel good Sunday [place]'s related. Last week was a pretty good week.  Has not been able to do the quad stretch.  Feels like he is good to go with the exercises at home. The HEP are good ones. Feels like doing them helps with his back and B anterior thigh pain and being better able to ambulate inclines.    Pertinent History Chronic L low back pain without sciatica. Has a lot of weakness and soreness B LE. Had a back x-ray which revealed a bulging disc. 5-10 years ago, pt fracured lower back and ever since then his back would hurt when he is standing. Went to a Restaurant manager, fast food which helped a lot back then. However, nowadays, his back would hurt again the next day. Denies loss of bowel or bladder control. Denies LE paresthesia. No falls within the past 6 months. No fear of falling. Next follow up appointment with Dr. Derrel Nip is in maybe 6 months.  Goes to heart track 3 days a week to work out on some of the machines there.  Pt adds that he has Parkinson's. Currently participates in [employer] for his Parkinson's as recommended by his doctor.  Feels like he has not strength in his legs since 3-4 years ago. Has been checked by his cardiologist. Also talked to his neurologist (Dr. Carles Collet) about it who did not say anything.    Patient Stated Goals Get rid of the pain. See if PT can help with his leg soress and weakness.    Currently in Pain? No/denies    Pain Score 0-No pain    Pain Onset More than a month ago              [facility] PT Assessment - 12/06/19 1736      Strength   Right Hip Extension 4/5    Right Hip ABduction 4+/5    Left Hip Extension 4+/5    Left Hip ABduction 4/5                                 PT Education - 12/06/19 1818    Education Details Ther-ex, HEP    Person(s) Educated Patient    Methods  Explanation;Demonstration;Tactile cues;Verbal cues;Handout    Comprehension Returned demonstration;Verbalized understanding           Objectives   MedbridgeAccess Code [email or web address]     Therapeutic exercise  Blood pressure L arm sitting, mechanically taken, normal cuff: 144/48, HR 53at start of session  S/L manually resisted hip abduction, prone manually resisted hip extension 1-2x each way for each LE   S/L hip abduction   L 10x3  R 10x3  Prone quad stretch   R 1 min x 2  L 1 min x 2  Prone glute max extension   R 10x  L 10x  Seated transversus abdominis contraction 10x5 seconds   Gait x 6 minutes  1279 ft    B anterior thigh tightness, did not get worse, no back pain  Improved exercise technique, movement at target joints, use of target muscles after mod verbal, visual, tactile cues.      Response to treatment Pt tolerated session well without aggravation of symptoms.   Clinical impression Pt has demonstrated overall improvement in glute med and max strength, centralization of symptoms, and overall decrease in bilateral anterior thigh pain. Symptoms however, started to increase in which pt reports of not being able to perform his HEP may play a factor. Per pt, performing his home exercises helps with his back and B LE pain. Upon subjective interview, pt reports that he feels like he can continue his progress performing his HEP. Skilled physical therapy services discharged after today's recert for this follow up session with pt continuing progress with his exercises at home.          PT Short Term Goals - 10/17/19 0956      PT SHORT TERM GOAL #1   Title Pt will be independent with his HEP to decrease pain, improve strength and function.    Baseline Pt performing his HEP based on subjective reports (10/17/2019)    Time 3    Period Weeks    Status Achieved    Target Date 10/20/19             PT Long Term Goals - 12/06/19 1729       PT LONG TERM GOAL #1   Title Patient will have a decrease in low back pain to 2/10 or less at worst to promote ability to lift items as well as perform standing tasks more comfortably.    Baseline 5/10  low back pain at most for the past 3 months (09/07/2019); 2-3/10 at worst for the past 7 days (10/17/2019); 5/10 at most for the past 7 days, yesterday (10/19/2019); 4/10 at most past 7 days (10/31/2019); 5-6/10 at most for the past 7 days around Saturday, but the last time he did his exercises was around Monday (12/06/2019)    Time 6    Period Weeks    Status Partially Met    Target Date 12/06/19      PT LONG TERM GOAL #2   Title Patient will improve bilateral hip extension and abduction strength by at least 1/2 MMT grade to promote ability to perform standing tasks more comfortably.    Baseline hip extension 4-/5 R, and L; hip abduction 4/5 R, and L (09/07/2019); Not tested today secondary to elevated blood pressure levels (10/19/2019); Hip extension 4-/5 R, 4/5 L, hip abduction 4/5 R and L (10/31/2019); hip extension 4/5 R, 4+/5 L, hip abduction 4+/5 R, 4/5 L (12/06/2019)    Time 6    Period Weeks    Status Partially Met    Target Date 12/06/19      PT LONG TERM GOAL #3   Title Pt will improve lumbar FOTO score by at least 10 points as a demonstration of improved function.    Baseline lumbar FOTO 58 (09/07/2019); 53 (10/31/2019); 58 (12/06/19)    Time 6    Period Weeks    Status Not Met    Target Date 12/06/19      PT LONG TERM GOAL #4   Title Pt will improve  6 minute walk test distance to 1328 ft or more to promote ability to ambulate further.    Baseline 1228 ft with 1/10 central low back pain (09/07/2019); 1037 ft (10/17/2019); 1279 ft (12/06/2019)    Time 6    Period Weeks    Status Partially Met    Target Date 12/06/19                 Plan - 12/06/19 1727    Clinical Impression Statement Pt has demonstrated overall improvement in glute med and max strength, centralization of  symptoms, and overall decrease in bilateral anterior thigh pain. Symptoms however, started to increase in which pt reports of not being able to perform his HEP may play a factor. Per pt, performing his home exercises helps with his back and B LE pain. Upon subjective interview, pt reports that he feels like he can continue his progress performing his HEP. Skilled physical therapy services discharged after today's recert for this follow up session with pt continuing progress with his exercises at home.    Personal Factors and Comorbidities Age;Comorbidity 3+;Past/Current Experience;Time since onset of injury/illness/exacerbation    Comorbidities DM, HTN, Parkinson's disease    Examination-Activity Limitations Lift;Carry;Stand    Stability/Clinical Decision Making Stable/Uncomplicated   pain seems to be worsening per pt subjective reports   Clinical Decision Making Low    Rehab Potential Fair    PT Frequency One time visit   today   PT Duration --    PT Treatment/Interventions Therapeutic activities;Therapeutic exercise;Neuromuscular re-education;Patient/family education;Manual techniques    PT Next Visit Plan continue with HEP    PT Home Exercise Plan Medbridge  Access Code [email or web address]    Consulted and Agree with Plan of Care Patient           Patient will benefit from skilled therapeutic intervention in order to improve the following deficits and impairments:  Pain,  Postural dysfunction, Improper body mechanics, Decreased strength  Visit Diagnosis: Chronic left-sided low back pain without sciatica - Plan: PT plan of care cert/re-cert  Muscle weakness (generalized) - Plan: PT plan of care cert/re-cert     Problem List Patient Active Problem List   Diagnosis Date Noted  . Low back pain of over 3 months duration 08/19/2019  . Pulmonary hypertension ([place])   . Insomnia 12/21/2018  . Sleep apnea in adult 12/09/2018  . Periodic limb movement disorder 12/09/2018  . Abnormal findings on  diagnostic imaging of lung 07/09/2018  . Lung nodule 07/09/2018  . Pulmonary nodules 05/18/2018  . Wears hearing aid in both ears 05/17/2018  . Leg pain, bilateral 02/20/2018  . Dyspnea   . Effort angina ([facility])   . CKD (chronic kidney disease) stage 3, GFR 30-59 ml/min 09/21/2017  . History of skin cancer in adulthood 08/14/2016  . Parkinson's disease ([place]) 08/09/2016  . Bilateral carotid artery stenosis 04/02/2015  . Vertigo, peripheral 10/17/2014  . Benign prostatic hypertrophy with urinary frequency 01/31/2014  . Encounter for Medicare annual wellness exam 07/02/2013  . Obesity 04/03/2013  . Other malaise and fatigue 09/21/2012  . Hyperlipidemia   . Hypertension   . 3-vessel coronary artery disease   . S/P CABG x 5   . 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  Joneen Boers PT, DPT   12/06/2019, 6:33 PM  [facility] [address], [place], [address] Phone: [phone]   Fax:  [phone]  Name: Johnathan Arnold. MRN: [id number] Date of Birth: [birth year]

## 2019-12-06 NOTE — Patient Instructions (Addendum)
1.  You can add an extra 1/2 to 1 tablet of carbidopa/levodopa 25/100 if having a bad day.  You can drop it in ginger ale or chew it if you want it to work faster 2.  You can try melatonin from 3mg -8mg  for sleep 3.  Your provider has requested that you have labwork completed today. Please go to [facility] ([id number]) on the second floor of this building before leaving the office today. You do not need to check in. If you are not called within 15 minutes please check with the front desk.

## 2019-12-06 NOTE — Addendum Note (Signed)
Addended by: Ulice Brilliant T on: 12/06/2019 02:56 PM   Modules accepted: Orders

## 2019-12-07 ENCOUNTER — Telehealth: Payer: Self-pay | Admitting: Neurology

## 2019-12-07 LAB — IRON,TIBC AND FERRITIN PANEL
%SAT: 43 % (calc) (ref 20–48)
Ferritin: 59 ng/mL (ref 24–380)
Iron: 132 ug/dL (ref 50–180)
TIBC: 305 mcg/dL (calc) (ref 250–425)

## 2019-12-07 NOTE — Telephone Encounter (Signed)
Let pt know that ferritin a bit low and could be contributing to/causing RLS.     -we will add ferrous sulfate 325 mg daily x 3 months.  Tell him to not take with milk.  Discussed that this can cause constipation and to drink plenty of water.    -take vitamin C, 100-200 mg with each ferrous sulfate dose, or take the ferrous sulfate with small glass of orange juice (if not diabetic)

## 2019-12-07 NOTE — Telephone Encounter (Signed)
Left message for patient to contact office.

## 2019-12-08 NOTE — Telephone Encounter (Signed)
Left detailed message with results, ok per DPR, and to call back if any questions.  

## 2019-12-14 ENCOUNTER — Ambulatory Visit: Payer: Medicare Other

## 2019-12-19 ENCOUNTER — Other Ambulatory Visit: Payer: Self-pay | Admitting: Neurology

## 2019-12-19 NOTE — Telephone Encounter (Signed)
Rx(s) sent to pharmacy electronically.  

## 2019-12-21 ENCOUNTER — Other Ambulatory Visit: Payer: Self-pay

## 2019-12-21 MED ORDER — FUROSEMIDE 20 MG PO TABS: 20.0000 mg | ORAL_TABLET | Freq: Every day | ORAL | 3 refills | Status: DC

## 2020-01-13 ENCOUNTER — Other Ambulatory Visit: Payer: Self-pay | Admitting: Internal Medicine

## 2020-01-25 ENCOUNTER — Other Ambulatory Visit: Payer: Self-pay

## 2020-01-25 MED ORDER — CARVEDILOL 6.25 MG PO TABS: 6.2500 mg | ORAL_TABLET | Freq: Two times a day (BID) | ORAL | 0 refills | Status: DC

## 2020-01-25 NOTE — Telephone Encounter (Signed)
*  STAT* If patient is at the pharmacy, call can be transferred to refill team.   1. Which medications need to be refilled? (please list name of each medication and dose if known) Carvedilol  2. Which pharmacy/location (including street and city if local pharmacy) is medication to be sent to? [facility]  3. Do they need a 30 day or 90 day supply? [place]

## 2020-01-30 NOTE — Progress Notes (Signed)
01/31/2020 5:28 PM   Johnathan Arnold January 13, 1946 [id number]  Referring provider: [name], MD [place],  [place] [address] No chief complaint on file.   HPI: Johnathan Arnold. is a 74 y.o. male presents today for follow up of issues with foreskin on penis.   He has a personal history of BXO. He is s/p penile biopsy, cystoscopy and meatal dilation in 2015. He was previously on clobetasol cream.   He as last seen in clinic on 04/15/2018. He was currently on Flomax started several years ago.  He had not tried any interval off of Flomax.  His symptoms were well controlled.  He did not recall why he got started on Flomax in the first place.  IPSS score was 6.  PVR was minimal.  UA unremarkable.  He did note that he was no longer using the cream on his penis on a regular basis.  He asked why his penis is shrinking.  He denied any spraying or splitting of his urinary stream.  Most recent PSA was 1.06 on 08/17/19.  He reports that he has numerous accidents. He notes spraying stream secondary to the foreskin on penis. His stream is better when he sits to urinate. He had no used his steroid treatment for his penis in a long time.   Denies taking a lot of baths in the tub.   PMH: Past Medical History:  Diagnosis Date  . 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 Diabetes mellitus without complication ([place])   . History of cardiac catheterization 2011   [facility]  . Hyperlipidemia   . Hypertension   . Hypertriglyceridemia   . Parkinson's disease ([place])   . 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 Vertigo     Surgical History: Past Surgical History:  Procedure Laterality Date  . CARDIAC CATHETERIZATION  05-19-2010   [facility]: Patent grafts. LIMA to LAD, SVG to D1, OM1 and RPDA  . CORONARY ARTERY BYPASS GRAFT  03/1998   5 vessel, [facility]  . RIGHT HEART CATH N/A 04/04/2019   Procedure: RIGHT HEART CATH;  Surgeon: Wellington Hampshire, MD;  Location: [facility];  Service: Cardiovascular;  Laterality: N/A;  . RIGHT/LEFT HEART CATH AND CORONARY ANGIOGRAPHY N/A 02/01/2018   Procedure: RIGHT/LEFT HEART CATH AND CORONARY ANGIOGRAPHY;  Surgeon: Wellington Hampshire, MD;  Location: [facility];  Service: Cardiovascular;  Laterality: N/A;    Home Medications:  Allergies as of 01/31/2020   No Known Allergies     Medication List       Accurate as of January 31, 2020 11:59 PM. If you have any questions, ask your nurse or doctor.        aspirin EC 81 MG tablet Take 162 mg by mouth at bedtime.   carbidopa-levodopa 50-200 MG tablet Commonly known as: SINEMET CR TAKE ONE TABLET BY MOUTH AT BEDTIME   carbidopa-levodopa 25-100 MG tablet Commonly known as: SINEMET IR TAKE 1.5 TABLETS BY MOUTH THREE TIMES A DAY   carvedilol 6.25 MG tablet Commonly known as: COREG Take 1 tablet (6.25 mg total) by mouth 2 (two) times daily.   clobetasol cream 0.05 % Commonly known as: TEMOVATE Apply 1 application topically 2 (two) times daily. Started by: Hollice Espy, MD   clonazePAM 0.5 MG tablet Commonly known as: KLONOPIN TAKE 1.5 TABLETS BY MOUTH EVERY NIGHT AT BEDTIME   ferrous sulfate 325 (65 FE)  MG tablet Take 325 mg by mouth daily with breakfast.   Fish Oil 1200 MG Caps Take 1,200 mg by mouth daily.   furosemide 20 MG tablet Commonly known as: LASIX Take 1 tablet (20 mg total) by mouth daily.   isosorbide mononitrate 60 MG 24 hr tablet Commonly known as: IMDUR TAKE ONE TABLET BY MOUTH DAILY   losartan 25 MG tablet Commonly known as: COZAAR TAKE ONE TABLET BY MOUTH DAILY   multivitamin tablet Take 1 tablet by mouth daily.   nitroGLYCERIN 0.4 MG SL tablet Commonly known as: NITROSTAT Place 1 tablet (0.4 mg total) under the tongue every 5 (five) minutes as needed for chest pain.   omeprazole 20 MG capsule Commonly known as: PRILOSEC TAKE ONE CAPSULE BY MOUTH EVERY MORNING   polyethylene glycol 17 g packet Commonly  known as: MIRALAX / GLYCOLAX Take 17 g by mouth daily.   ranolazine 1000 MG SR tablet Commonly known as: RANEXA TAKE ONE TABLET BY MOUTH TWICE A DAY   rosuvastatin 10 MG tablet Commonly known as: CRESTOR Take 1 tablet (10 mg total) by mouth daily.   tamsulosin 0.4 MG Caps capsule Commonly known as: FLOMAX Take 1 capsule (0.4 mg total) by mouth daily. What changed: when to take this   vitamin C 100 MG tablet Take 100 mg by mouth daily.       Allergies: No Known Allergies  Family History: Family History  Problem Relation Age of Onset  . Heart attack Mother 72  . Hypertension Mother   . Heart attack Father 5  . Heart disease Father   . Heart disease Brother   . Healthy Son     Social History:  reports that he is a non-smoker but has been exposed to tobacco smoke. He has never used smokeless tobacco. He reports current alcohol use. He reports that he does not use drugs.   Physical Exam: BP (!) 196/78   Ht 5\' 8"  (1.727 m)   Wt 188 lb (85.3 kg)   BMI 28.59 kg/m   Constitutional:  Alert and oriented, No acute distress. HEENT: [place] AT, moist mucus membranes.  Trachea midline, no masses. Cardiovascular: No clubbing, cyanosis, or edema. Respiratory: Normal respiratory effort, no increased work of breathing. GI: Abdomen is soft, nontender, nondistended, no abdominal masses GU: No CVA tenderness. Shaft skin of penis became adherent, partially covered gland and meatus.  Adhesions were taken down bluntly with a small amount of friable tissue underlying. Skin: No rashes, bruises or suspicious lesions. Neurologic: Grossly intact, no focal deficits, moving all 4 extremities. Psychiatric: Normal mood and affect.  Laboratory Data:  Lab Results  Component Value Date   CREATININE 2.00 (H) 08/17/2019    Lab Results  Component Value Date   PSA 1.06 08/17/2019   PSA 1.08 08/01/2014   PSA 1.00 07/07/2013    Lab Results  Component Value Date   HGBA1C 5.8 08/17/2019      Assessment & Plan:    1. BXO/penile adhesions  Penile adhesion was reduced and the bridges of skin on the ureteral meatus were freed up during physical exam. Discussed self reduction on remaining adhesions with 30 minute bathes.  -Rx for clobetasol BID sent.   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   Follow up in 1 month for symptoms recheck.    [facility] [address] [place], [place] [address] [phone]  I, Selena Batten, am acting as a scribe for Dr. Caryl Pina  Erlene Quan.  I have reviewed the above documentation for accuracy and completeness, and I agree with the above.   Hollice Espy, MD

## 2020-01-31 ENCOUNTER — Other Ambulatory Visit: Payer: Self-pay

## 2020-01-31 ENCOUNTER — Ambulatory Visit (INDEPENDENT_AMBULATORY_CARE_PROVIDER_SITE_OTHER): Payer: Medicare Other | Admitting: Urology

## 2020-01-31 VITALS — BP 196/78 | Ht 68.0 in | Wt 188.0 lb

## 2020-01-31 DIAGNOSIS — N401 Enlarged prostate with lower urinary tract symptoms: Secondary | ICD-10-CM

## 2020-01-31 DIAGNOSIS — N475 Adhesions of prepuce and glans penis: Secondary | ICD-10-CM

## 2020-01-31 DIAGNOSIS — N480 Leukoplakia of penis: Secondary | ICD-10-CM | POA: Diagnosis not present

## 2020-01-31 DIAGNOSIS — I209 Angina pectoris, unspecified: Secondary | ICD-10-CM

## 2020-01-31 DIAGNOSIS — E1159 Type 2 diabetes mellitus with other circulatory complications: Secondary | ICD-10-CM | POA: Diagnosis not present

## 2020-01-31 MED ORDER — CLOBETASOL PROPIONATE 0.05 % EX CREA: 1.0000 "application " | TOPICAL_CREAM | Freq: Two times a day (BID) | CUTANEOUS | 0 refills | Status: DC

## 2020-02-28 NOTE — Progress Notes (Signed)
02/29/2020 10:45 AM   Johnathan Arnold 1945/11/07 [id number]  Referring provider: [name], MD [address] [place],  [place] [address] Chief Complaint  Patient presents with  . Benign Prostatic Hypertrophy    HPI: Johnathan Arnold. is a 74 y.o. male who presents today for a 1 month follow up of BXO/penile adhensions and BPH with obstruction.   He has a personal history of BXO. He is s/p penile biopsy, cystoscopy and meatal dilation in 2015. He was previously on clobetasol cream.   At last visit, he was complaining of spraying of his urinary stream and increased irritative penile complaints.  He underwent reduction of adhesions in the office and has been self reducing and working on hygiene in the interim as well as resuming clobetasol twice daily.  His urinary symptoms have also improved.  He is overall feeling well.  He is pleased with the result.  PMH: Past Medical History:  Diagnosis Date  . 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 Diabetes mellitus without complication ([place])   . History of cardiac catheterization 2011   [facility]  . Hyperlipidemia   . Hypertension   . Hypertriglyceridemia   . Parkinson's disease ([place])   . 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 Vertigo     Surgical History: Past Surgical History:  Procedure Laterality Date  . CARDIAC CATHETERIZATION  05-19-2010   [facility]: Patent grafts. LIMA to LAD, SVG to D1, OM1 and RPDA  . CORONARY ARTERY BYPASS GRAFT  03/1998   5 vessel, [facility]  . RIGHT HEART CATH N/A 04/04/2019   Procedure: RIGHT HEART CATH;  Surgeon: Wellington Hampshire, MD;  Location: [facility];  Service: Cardiovascular;  Laterality: N/A;  . RIGHT/LEFT HEART CATH AND CORONARY ANGIOGRAPHY N/A 02/01/2018   Procedure: RIGHT/LEFT HEART CATH AND CORONARY ANGIOGRAPHY;  Surgeon: Wellington Hampshire, MD;  Location: [facility];  Service: Cardiovascular;  Laterality: N/A;    Home Medications:  Allergies  as of 02/29/2020   No Known Allergies     Medication List       Accurate as of February 29, 2020 11:59 PM. If you have any questions, ask your nurse or doctor.        STOP taking these medications   ferrous sulfate 325 (65 FE) MG tablet Stopped by: Hollice Espy, MD   vitamin C 100 MG tablet Stopped by: Hollice Espy, MD     TAKE these medications   aspirin EC 81 MG tablet Take 162 mg by mouth at bedtime.   carbidopa-levodopa 50-200 MG tablet Commonly known as: SINEMET CR TAKE ONE TABLET BY MOUTH AT BEDTIME   carbidopa-levodopa 25-100 MG tablet Commonly known as: SINEMET IR TAKE 1.5 TABLETS BY MOUTH THREE TIMES A DAY   carvedilol 6.25 MG tablet Commonly known as: COREG Take 1 tablet (6.25 mg total) by mouth 2 (two) times daily.   clobetasol cream 0.05 % Commonly known as: TEMOVATE Apply 1 application topically 2 (two) times daily.   clonazePAM 0.5 MG tablet Commonly known as: KLONOPIN TAKE 1.5 TABLETS BY MOUTH EVERY NIGHT AT BEDTIME   Fish Oil 1200 MG Caps Take 1,200 mg by mouth daily.   furosemide 20 MG tablet Commonly known as: LASIX Take 1 tablet (20 mg total) by mouth daily.   isosorbide mononitrate 60 MG 24 hr tablet Commonly known as: IMDUR TAKE ONE TABLET BY MOUTH DAILY   losartan 25  MG tablet Commonly known as: COZAAR TAKE ONE TABLET BY MOUTH DAILY   multivitamin tablet Take 1 tablet by mouth daily.   nitroGLYCERIN 0.4 MG SL tablet Commonly known as: NITROSTAT Place 1 tablet (0.4 mg total) under the tongue every 5 (five) minutes as needed for chest pain.   omeprazole 20 MG capsule Commonly known as: PRILOSEC TAKE ONE CAPSULE BY MOUTH EVERY MORNING   polyethylene glycol 17 g packet Commonly known as: MIRALAX / GLYCOLAX Take 17 g by mouth daily.   ranolazine 1000 MG SR tablet Commonly known as: RANEXA TAKE ONE TABLET BY MOUTH TWICE A DAY   rosuvastatin 10 MG tablet Commonly known as: CRESTOR Take 1 tablet (10 mg total) by mouth  daily.   tamsulosin 0.4 MG Caps capsule Commonly known as: FLOMAX Take 1 capsule (0.4 mg total) by mouth daily. What changed: when to take this       Allergies: No Known Allergies  Family History: Family History  Problem Relation Age of Onset  . Heart attack Mother 106  . Hypertension Mother   . Heart attack Father 58  . Heart disease Father   . Heart disease Brother   . Healthy Son     Social History:  reports that he is a non-smoker but has been exposed to tobacco smoke. He has never used smokeless tobacco. He reports current alcohol use. He reports that he does not use drugs.   Physical Exam: BP 126/67   Pulse (!) 52   Ht 5\' 7"  (1.702 m)   Wt 187 lb (84.8 kg)   BMI 29.29 kg/m   Constitutional:  Alert and oriented, No acute distress. HEENT: [place] AT, moist mucus membranes.  Trachea midline, no masses. Cardiovascular: No clubbing, cyanosis, or edema. Respiratory: Normal respiratory effort, no increased work of breathing. GI: Abdomen is soft, nontender, nondistended, no abdominal masses GU:  Most adhesions have healed/remain reduced. Adhesion on ventral of cornal margin are healing. No inflammation on urethral or foreskin.  Meatus is patent.or inguinal lymphadenopathy.  Skin: No rashes, bruises or suspicious lesions. Neurologic: Grossly intact, no focal deficits, moving all 4 extremities. Psychiatric: Normal mood and affect.  Laboratory Data:  Lab Results  Component Value Date   CREATININE 2.00 (H) 08/17/2019    Lab Results  Component Value Date   PSA 1.06 08/17/2019   PSA 1.08 08/01/2014   PSA 1.00 07/07/2013    Lab Results  Component Value Date   HGBA1C 5.8 08/17/2019    Assessment & Plan:    1. BXO/penile adhesions Advise patient to use Clobetasol as needed.  Continue to self reduce any adhesions and be diligent about hygiene  2. BPH with obstruction  Continue Flomax.  RTC as needed.    [place] [address],  [place],  [address] [phone]  I, Selena Batten, am acting as a scribe for Dr. Hollice Espy.  I have reviewed the above documentation for accuracy and completeness, and I agree with the above.   Hollice Espy, MD

## 2020-02-29 ENCOUNTER — Other Ambulatory Visit: Payer: Self-pay

## 2020-02-29 ENCOUNTER — Ambulatory Visit (INDEPENDENT_AMBULATORY_CARE_PROVIDER_SITE_OTHER): Payer: Medicare Other | Admitting: Urology

## 2020-02-29 VITALS — BP 126/67 | HR 52 | Ht 67.0 in | Wt 187.0 lb

## 2020-02-29 DIAGNOSIS — N480 Leukoplakia of penis: Secondary | ICD-10-CM

## 2020-02-29 DIAGNOSIS — N401 Enlarged prostate with lower urinary tract symptoms: Secondary | ICD-10-CM | POA: Diagnosis not present

## 2020-02-29 DIAGNOSIS — I6523 Occlusion and stenosis of bilateral carotid arteries: Secondary | ICD-10-CM

## 2020-02-29 DIAGNOSIS — N475 Adhesions of prepuce and glans penis: Secondary | ICD-10-CM | POA: Diagnosis not present

## 2020-03-01 ENCOUNTER — Other Ambulatory Visit: Payer: Self-pay

## 2020-03-01 ENCOUNTER — Ambulatory Visit (INDEPENDENT_AMBULATORY_CARE_PROVIDER_SITE_OTHER): Payer: Medicare Other | Admitting: Cardiovascular Disease

## 2020-03-01 ENCOUNTER — Encounter: Payer: Self-pay | Admitting: Cardiovascular Disease

## 2020-03-01 VITALS — BP 144/64 | HR 49 | Ht 67.0 in | Wt 190.0 lb

## 2020-03-01 DIAGNOSIS — E7800 Pure hypercholesterolemia, unspecified: Secondary | ICD-10-CM | POA: Diagnosis not present

## 2020-03-01 DIAGNOSIS — I10 Essential (primary) hypertension: Secondary | ICD-10-CM

## 2020-03-01 DIAGNOSIS — I2510 Atherosclerotic heart disease of native coronary artery without angina pectoris: Secondary | ICD-10-CM | POA: Diagnosis not present

## 2020-03-01 DIAGNOSIS — I272 Pulmonary hypertension, unspecified: Secondary | ICD-10-CM

## 2020-03-01 DIAGNOSIS — I6523 Occlusion and stenosis of bilateral carotid arteries: Secondary | ICD-10-CM

## 2020-03-01 DIAGNOSIS — Z23 Encounter for immunization: Secondary | ICD-10-CM | POA: Diagnosis not present

## 2020-03-01 NOTE — Patient Instructions (Signed)
Medication Instructions:  Your physician recommends that you continue on your current medications as directed. Please refer to the Current Medication list given to you today.  *If you need a refill on your cardiac medications before your next appointment, please call your pharmacy*   Lab Work: None ordered   You have been givne the flu vaccine today.  If you have labs (blood work) drawn today and your tests are completely normal, you will receive your results only by: Marland Kitchen MyChart Message (if you have MyChart) OR . A paper copy in the mail If you have any lab test that is abnormal or we need to change your treatment, we will call you to review the results.   Testing/Procedures: None ordered   Follow-Up: At [facility], you and your health needs are our priority.  As part of our continuing mission to provide you with exceptional heart care, we have created designated Provider Care Teams.  These Care Teams include your primary Cardiologist (physician) and Advanced Practice Providers (APPs -  Physician Assistants and Nurse Practitioners) who all work together to provide you with the care you need, when you need it.  We recommend signing up for the patient portal called "MyChart".  Sign up information is provided on this After Visit Summary.  MyChart is used to connect with patients for Virtual Visits (Telemedicine).  Patients are able to view lab/test results, encounter notes, upcoming appointments, etc.  Non-urgent messages can be sent to your provider as well.   To learn more about what you can do with MyChart, go to [email or web address].    Your next appointment:   6 month(s)  The format for your next appointment:   In Person  Provider:   You may see [name] or one of the following Advanced Practice Providers on your designated Care Team:    Murray Hodgkins, NP  Christell Faith, PA-C  Marrianne Mood, PA-C  Cadence Kathlen Mody, [place]    Other Instructions N/A

## 2020-03-01 NOTE — Progress Notes (Signed)
Cardiology Office Note   Date:  03/01/2020   ID:  Johnathan Arnold., DOB 03-11-1946, MRN [id number]  PCP:  [name], MD  Cardiologist:   [name], MD   Chief Complaint  Patient presents with  . Follow-up    6 Months follow up. Medications verbally reviewed with patient.       History of Present Illness: Johnathan Arnold. is a 74 y.o. male who presents for a followup visit.  He has known history of coronary artery disease status post CABG in 1999. He also has known history of hypertension, chronic kidney disease, hyperlipidemia, Parkinson's disease, sleep apnea on CPAP and type 2 diabetes.  Previous carotid Doppler showed mild nonobstructive bilateral disease.  A right and left cardiac catheterization was done in August 2019 which showed occluded native arteries with patent grafts including LIMA to LAD, SVG to diagonal, SVG to OM and SVG to distal RCA.  Right heart catheterization showed normal filling pressures, mild pulmonary hypertension and normal cardiac output. He has chronic bilateral exertional leg pain with normal lower extremity arterial Doppler in 2018.    Was diagnosed with moderate pulmonary hypertension last year. VQ scan was low probability. Right heart catheterization in October of last year showed moderate pulmonary hypertension at 54 /13 mmHg with wedge pressure of 17 mmHg.  Pulmonary vascular resistance was 2.22 Woods units.  Pulmonary hypertension was felt to be of a mixed etiology.   He has underlying sleep apnea but uses CPAP on a regular basis.  The patient continues to follow-up with nephrology for chronic kidney disease with most recent creatinine around 2. He has been doing reasonably well with stable exertional dyspnea. He continues to struggle with bilateral leg pain and weakness with activities. We did ABI during last visit which was normal and aortoiliac duplex showed no evidence of significant aortoiliac disease.  Past Medical History:    Diagnosis Date  . 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 Diabetes mellitus without complication ([place])   . History of cardiac catheterization 2011   [facility]  . Hyperlipidemia   . Hypertension   . Hypertriglyceridemia   . Parkinson's disease ([place])   . 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 Vertigo     Past Surgical History:  Procedure Laterality Date  . CARDIAC CATHETERIZATION  05-19-2010   [facility]: Patent grafts. LIMA to LAD, SVG to D1, OM1 and RPDA  . CORONARY ARTERY BYPASS GRAFT  03/1998   5 vessel, [facility]  . RIGHT HEART CATH N/A 04/04/2019   Procedure: RIGHT HEART CATH;  Surgeon: Wellington Hampshire, MD;  Location: [facility];  Service: Cardiovascular;  Laterality: N/A;  . RIGHT/LEFT HEART CATH AND CORONARY ANGIOGRAPHY N/A 02/01/2018   Procedure: RIGHT/LEFT HEART CATH AND CORONARY ANGIOGRAPHY;  Surgeon: Wellington Hampshire, MD;  Location: [facility];  Service: Cardiovascular;  Laterality: N/A;     Current Outpatient Medications  Medication Sig Dispense Refill  . aspirin EC 81 MG tablet Take 162 mg by mouth at bedtime.    . carbidopa-levodopa (SINEMET CR) 50-200 MG tablet TAKE ONE TABLET BY MOUTH AT BEDTIME 90 tablet 3  . carbidopa-levodopa (SINEMET IR) 25-100 MG tablet TAKE 1.5 TABLETS BY MOUTH THREE TIMES A DAY 405 tablet 0  . carvedilol (COREG) 6.25 MG tablet Take 1 tablet (6.25 mg total) by mouth 2 (two) times daily. 180 tablet 0  . clobetasol cream (TEMOVATE) 0.05 %  Apply 1 application topically 2 (two) times daily. 30 g 0  . clonazePAM (KLONOPIN) 0.5 MG tablet TAKE 1.5 TABLETS BY MOUTH EVERY NIGHT AT BEDTIME 45 tablet 4  . furosemide (LASIX) 20 MG tablet Take 1 tablet (20 mg total) by mouth daily. 90 tablet 3  . isosorbide mononitrate (IMDUR) 60 MG 24 hr tablet TAKE ONE TABLET BY MOUTH DAILY 90 tablet 3  . losartan (COZAAR) 25 MG tablet TAKE ONE TABLET BY MOUTH DAILY 90 tablet 1  . Multiple Vitamin (MULTIVITAMIN) tablet Take 1 tablet by mouth daily.       . nitroGLYCERIN (NITROSTAT) 0.4 MG SL tablet Place 1 tablet (0.4 mg total) under the tongue every 5 (five) minutes as needed for chest pain. 25 tablet 1  . Omega-3 Fatty Acids (FISH OIL) 1200 MG CAPS Take 1,200 mg by mouth daily.     Marland Kitchen omeprazole (PRILOSEC) 20 MG capsule TAKE ONE CAPSULE BY MOUTH EVERY MORNING 90 capsule 0  . polyethylene glycol (MIRALAX / GLYCOLAX) packet Take 17 g by mouth daily.     . ranolazine (RANEXA) 1000 MG SR tablet TAKE ONE TABLET BY MOUTH TWICE A DAY 60 tablet 4  . tamsulosin (FLOMAX) 0.4 MG CAPS capsule Take 1 capsule (0.4 mg total) by mouth daily. (Patient taking differently: Take 0.4 mg by mouth at bedtime. ) 90 capsule 3  . rosuvastatin (CRESTOR) 10 MG tablet Take 1 tablet (10 mg total) by mouth daily. 90 tablet 3   No current facility-administered medications for this visit.    Allergies:   Patient has no known allergies.    Social History:  The patient  reports that he is a non-smoker but has been exposed to tobacco smoke. He has never used smokeless tobacco. He reports current alcohol use. He reports that he does not use drugs.   Family History:  The patient's family history includes Healthy in his son; Heart attack (age of onset: 43) in his father; Heart attack (age of onset: 15) in his mother; Heart disease in his brother and father; Hypertension in his mother.    ROS:  Please see the history of present illness.   Otherwise, review of systems are positive for none.   All other systems are reviewed and negative.    PHYSICAL EXAM: VS:  BP (!) 144/64 (BP Location: Left Arm, Patient Position: Sitting, Cuff Size: Normal)   Pulse (!) 49   Ht 5\' 7"  (1.702 m)   Wt 190 lb (86.2 kg)   SpO2 98%   BMI 29.76 kg/m  , BMI Body mass index is 29.76 kg/m. GEN: Well nourished, well developed, in no acute distress  HEENT: normal  Neck: no JVD, carotid bruits, or masses Cardiac: RRR; no murmurs, rubs, or gallops,no edema  Respiratory:  clear to auscultation  bilaterally, normal work of breathing GI: soft, nontender, nondistended, + BS MS: no deformity or atrophy  Skin: warm and dry, no rash Neuro:  Strength and sensation are intact Psych: euthymic mood, full affect Femoral pulses mildly diminished bilaterally and distal pulses are palpable  EKG:  EKG is ordered today. 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  Recent Labs: 03/31/2019: Hemoglobin 11.3; Platelets 137 08/17/2019: ALT 16; BUN 27; Creatinine, Ser 2.00; Potassium 4.8; Sodium 138; TSH 2.70    Lipid Panel    Component Value Date/Time   CHOL 113 08/17/2019 1055   TRIG 105.0 08/17/2019 1055   HDL 60.50 08/17/2019 1055   CHOLHDL 2 08/17/2019 1055  VLDL 21.0 08/17/2019 1055   LDLCALC 32 08/17/2019 1055   LDLDIRECT 38.0 08/06/2015 1118      Wt Readings from Last 3 Encounters:  03/01/20 190 lb (86.2 kg)  02/29/20 187 lb (84.8 kg)  01/31/20 188 lb (85.3 kg)        ASSESSMENT AND PLAN:  1.  Coronary artery disease involving native coronary arteries with stable angina: Most recent cardiac catheterization in 2019 showed patent grafts.  He likely has some ischemia in the proximal LAD distribution that is not bypassed.  Continue Ranexa and Imdur.  2.  Moderate pulmonary hypertension: His symptoms are overall stable and he appears to be euvolemic on current dose of furosemide.   3. Bilateral carotid artery disease:  Mild nonobstructive disease. Continue treatment of risk factors.  4. Hyperlipidemia: Continue treatment with rosuvastatin. Most recent lipid profile showed an LDL of 32.  5. Essential hypertension: Blood pressure is reasonably controlled. He is mildly bradycardic but this is chronic. We could consider decreasing carvedilol and increasing losartan but he does have underlying chronic kidney disease and we might instead consider adding amlodipine.  6. Exertional leg pain: Does not seem to be vascular in etiology.  The patient  was given flu shot today.  Disposition:   FU with me in 6 months  Signed,  [name], MD  03/01/2020 9:20 AM    [place]

## 2020-03-02 DIAGNOSIS — Z03818 Encounter for observation for suspected exposure to other biological agents ruled out: Secondary | ICD-10-CM | POA: Diagnosis not present

## 2020-03-02 DIAGNOSIS — Z1152 Encounter for screening for COVID-19: Secondary | ICD-10-CM | POA: Diagnosis not present

## 2020-03-03 ENCOUNTER — Other Ambulatory Visit: Payer: Self-pay | Admitting: Neurology

## 2020-03-13 ENCOUNTER — Encounter: Payer: Self-pay | Admitting: Neurology

## 2020-03-13 DIAGNOSIS — X32XXXA Exposure to sunlight, initial encounter: Secondary | ICD-10-CM | POA: Diagnosis not present

## 2020-03-13 DIAGNOSIS — D2262 Melanocytic nevi of left upper limb, including shoulder: Secondary | ICD-10-CM | POA: Diagnosis not present

## 2020-03-13 DIAGNOSIS — L570 Actinic keratosis: Secondary | ICD-10-CM | POA: Diagnosis not present

## 2020-03-13 DIAGNOSIS — D225 Melanocytic nevi of trunk: Secondary | ICD-10-CM | POA: Diagnosis not present

## 2020-03-13 DIAGNOSIS — D2272 Melanocytic nevi of left lower limb, including hip: Secondary | ICD-10-CM | POA: Diagnosis not present

## 2020-03-13 DIAGNOSIS — D2261 Melanocytic nevi of right upper limb, including shoulder: Secondary | ICD-10-CM | POA: Diagnosis not present

## 2020-03-13 DIAGNOSIS — Z85828 Personal history of other malignant neoplasm of skin: Secondary | ICD-10-CM | POA: Diagnosis not present

## 2020-03-22 ENCOUNTER — Other Ambulatory Visit: Payer: Self-pay | Admitting: Neurology

## 2020-03-23 NOTE — Telephone Encounter (Signed)
Rx(s) sent to pharmacy electronically.  

## 2020-03-27 ENCOUNTER — Other Ambulatory Visit: Payer: Self-pay | Admitting: Neurology

## 2020-03-31 ENCOUNTER — Other Ambulatory Visit: Payer: Self-pay | Admitting: Neurology

## 2020-04-01 ENCOUNTER — Other Ambulatory Visit: Payer: Self-pay | Admitting: Internal Medicine

## 2020-04-01 DIAGNOSIS — Z23 Encounter for immunization: Secondary | ICD-10-CM | POA: Diagnosis not present

## 2020-04-04 ENCOUNTER — Other Ambulatory Visit: Payer: Self-pay | Admitting: Neurology

## 2020-04-04 ENCOUNTER — Other Ambulatory Visit: Payer: Self-pay | Admitting: *Deleted

## 2020-04-04 MED ORDER — RANOLAZINE ER 1000 MG PO TB12: 1000.0000 mg | ORAL_TABLET | Freq: Two times a day (BID) | ORAL | 3 refills | Status: DC

## 2020-04-04 NOTE — Telephone Encounter (Signed)
Rx(s) sent to pharmacy electronically.  

## 2020-04-11 DIAGNOSIS — Z20822 Contact with and (suspected) exposure to covid-19: Secondary | ICD-10-CM | POA: Diagnosis not present

## 2020-04-27 IMAGING — CT CT CHEST W/O CM
2 of 4 series · 15 of 36 positions shown, 18 images · non-contrast
Comparison: Prior radiographs. No prior chest or abdominal CTs in
our system.

CLINICAL DATA: 72-year-old male with biapical nodules on recent
chest radiograph.

EXAM:
CT CHEST WITHOUT CONTRAST
TECHNIQUE: Multidetector CT imaging of the chest was performed following the
standard protocol without IV contrast.

[Series 2: chest · axial · 0.60mm/px · z∈[-1184,-924]mm · 12 of 154 slices shown, 15 images (1 of 2)]
[im 12/154  mediastinal]
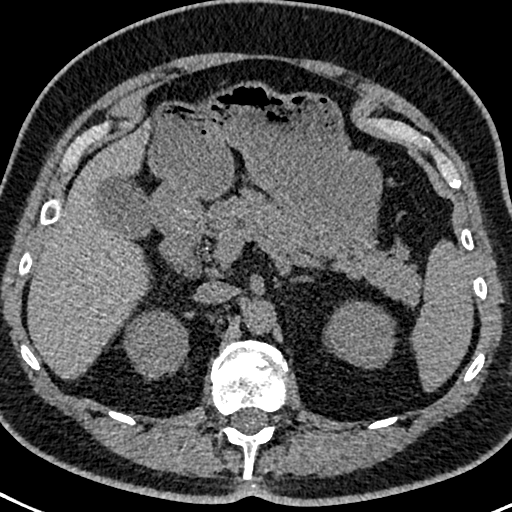
[im 12/154  lung]
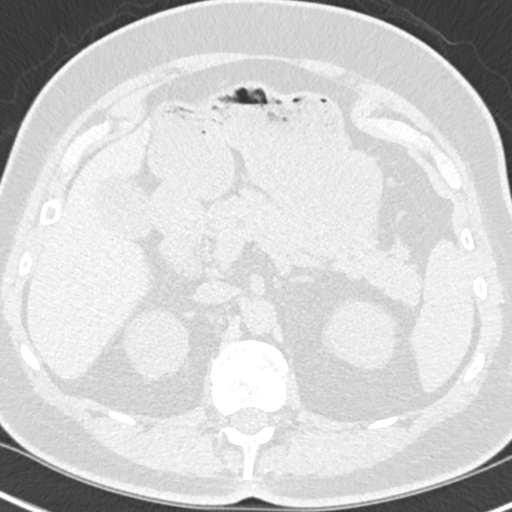
[im 24/154  lung]
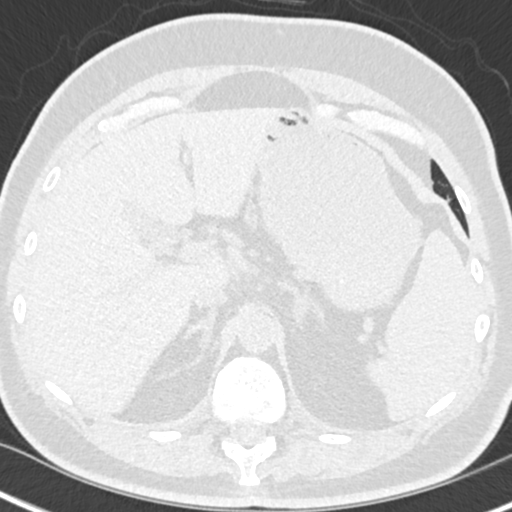
[im 36/154  lung]
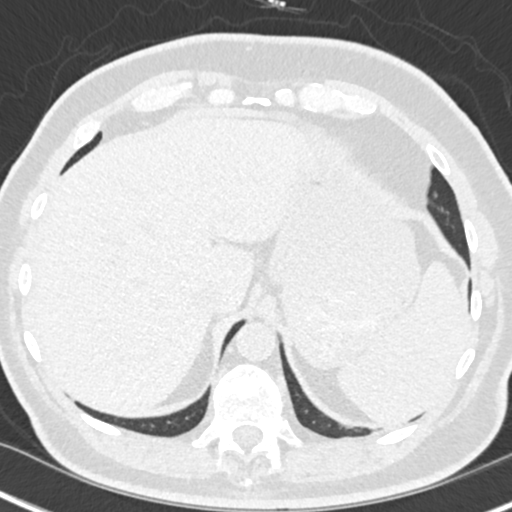
[im 48/154  lung]
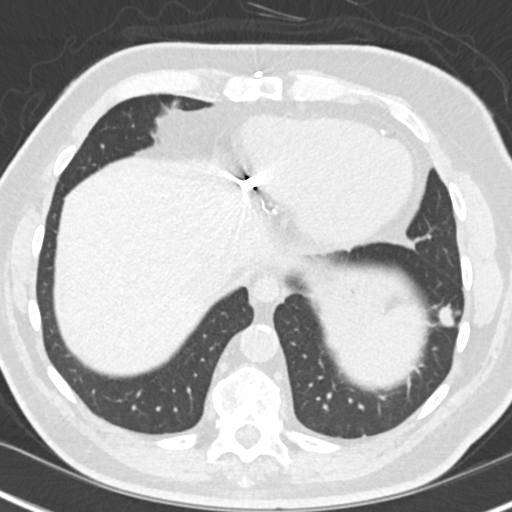
[im 59/154  mediastinal]
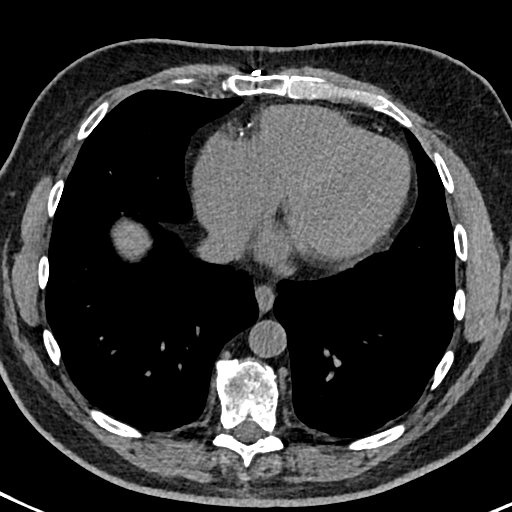
[im 59/154  lung]
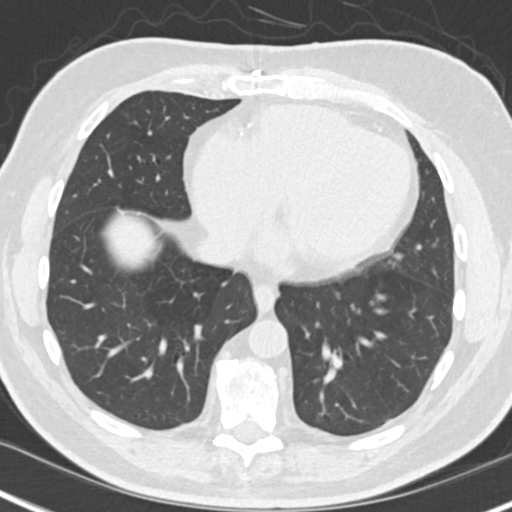
[im 71/154  lung]
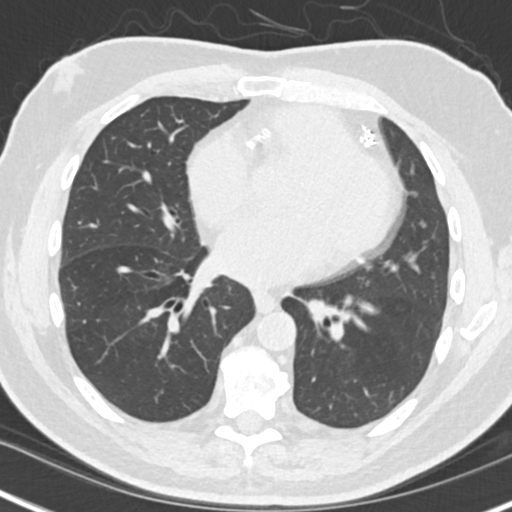
[im 83/154  lung]
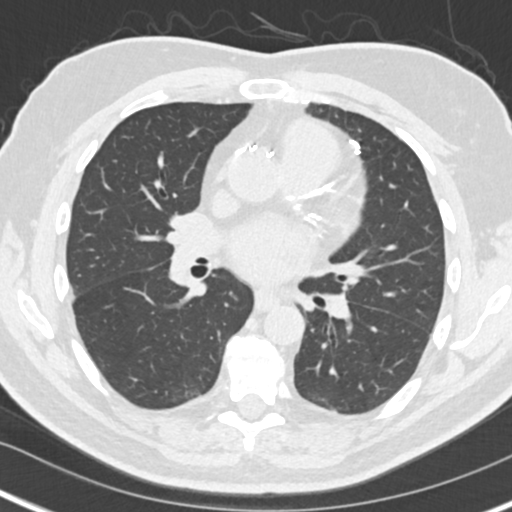
[im 95/154  lung]
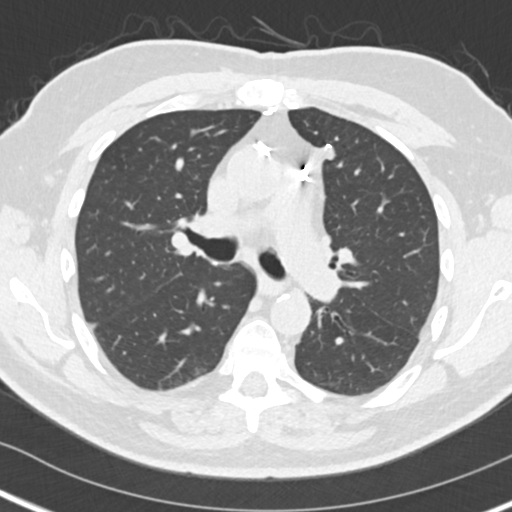
[im 106/154  mediastinal]
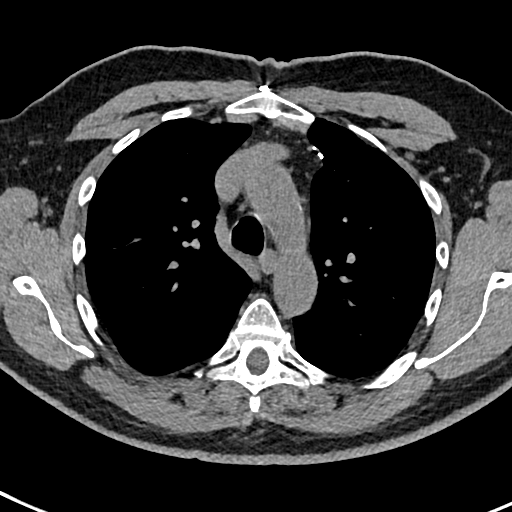
[im 106/154  lung]
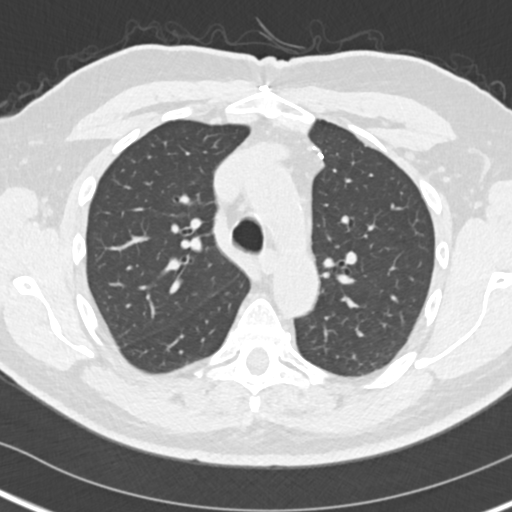
[im 118/154  lung]
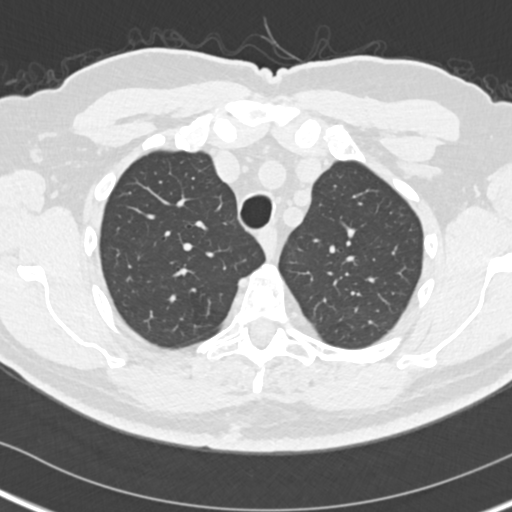
[im 130/154  lung]
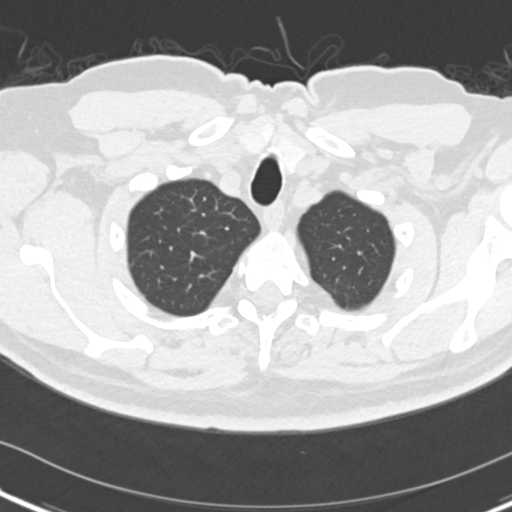
[im 142/154  lung]
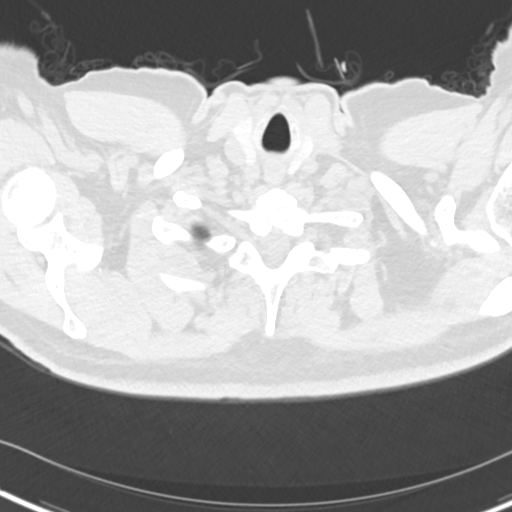

[Series 5: chest · coronal · 0.60mm/px · 3 of 154 slices shown (2 of 2)]
[im 31/154  lung]
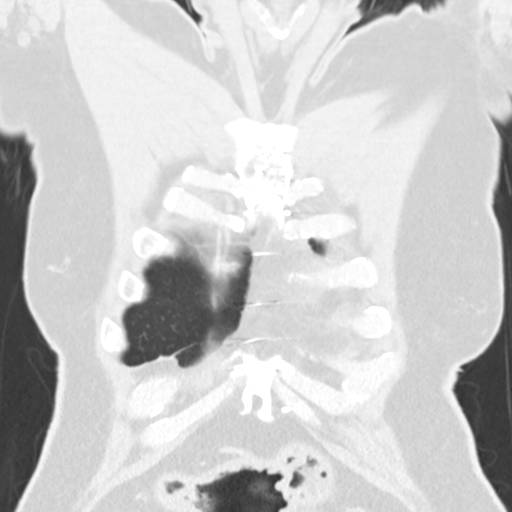
[im 62/154  lung]
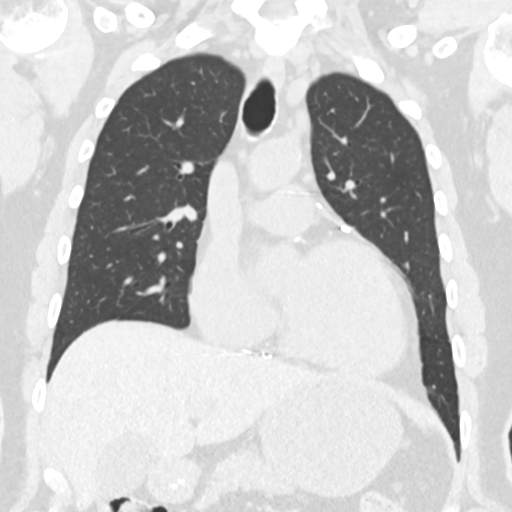
[im 92/154  lung]
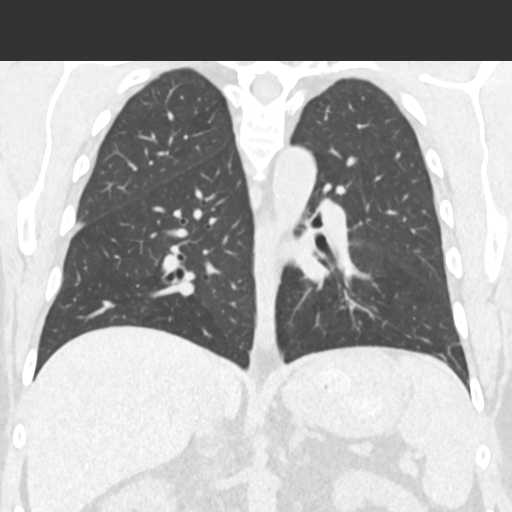

[15 of 36 positions shown; findings below may reference images not displayed]

FINDINGS: Cardiovascular: Cardiomegaly, heavy coronary artery calcifications
and CABG changes noted. Aortic atherosclerotic calcifications
identified without aortic aneurysm or pericardial effusion.

Mediastinum/Nodes: No enlarged mediastinal or axillary lymph nodes.
Thyroid gland, trachea, and esophagus demonstrate no significant
findings.

Lungs/Pleura: An 11 mm LEFT LOWER lobe basilar nodule is identified
(series 3: Image 108).

Other subcentimeter nodules are identified within the RIGHT lung,
all measuring 4 mm or less with the following index nodules:

A 4 mm RIGHT UPPER lobe nodule ([DATE])

A 3 mm RIGHT UPPER lobe nodule ([DATE])

Two separate 2 mm RIGHT LOWER lobe nodules ([DATE]).

No airspace disease, consolidation, mass, pleural effusion or
pneumothorax identified.

Upper Abdomen: No acute abnormality.

Musculoskeletal: No acute or suspicious bony abnormalities
identified.
IMPRESSION: 1. 11 mm LEFT LOWER lobe basilar nodule. If no prior outside CTs are
available comparison, then consider one of the following in 3 months
for both low-risk and high-risk individuals: (a) repeat chest CT,
(b) follow-up PET-CT, or (c) tissue sampling. This recommendation
follows the consensus statement: Guidelines for Management of
Incidental Pulmonary Nodules Detected on CT Images: From the
2. Other smaller RIGHT pulmonary nodules, all less than 5 mm.
Non-contrast chest CT can be considered in 12 months if patient is
high-risk. This recommendation follows the consensus statement:
Guidelines for Management of Incidental Pulmonary Nodules Detected
3. Cardiomegaly, coronary artery disease/CABG changes and Aortic
Atherosclerosis (OBI6Y-TTH.H).

## 2020-04-28 ENCOUNTER — Other Ambulatory Visit: Payer: Self-pay | Admitting: Urology

## 2020-04-28 ENCOUNTER — Other Ambulatory Visit: Payer: Self-pay | Admitting: Cardiovascular Disease

## 2020-04-28 DIAGNOSIS — N401 Enlarged prostate with lower urinary tract symptoms: Secondary | ICD-10-CM

## 2020-05-14 NOTE — Progress Notes (Signed)
Assessment/Plan:   1.  Parkinsons Disease  -Continue carbidopa/levodopa 25/100, 1.5 tablets 3 times per day.  Told him he could take an extra if needed.  -Continue carbidopa/levodopa 50/200 CR at bedtime.  2.  RLS/RBD  -Continue clonazepam 0.5 mg, 1.0 tablets nightly (was on 1.5 but he lowered it and feels like he is doing well)  -Patient with low ferritin levels.  Iron recommended but he didn't take it.  Feels that he is doing fine in terms of restless leg.  3.  Sleep apnea  -On CPAP.  4.  Pulmonary hypertension  -Follows with cardiology and pulmonary  5.  Memory change  -discussed neurocog testing and we will proceed with that  6.  Lightheadedness  -on multiple antihypertensives  -Would like him to take his blood pressure in various positions, including while standing up.  He has taken blood pressure at home and generally runs high, but usually takes it while sitting.  7.  GAD  -Wonder if some of his easy irritation is frustration with wife's difficulty in communicating (she has had a stroke in the past and has speech trouble that I am sure is frustrating for them both).  -pt declines medication.  Discussed with patient and wife that neurocognitive testing may reveal any mood issues as well. Subjective:   Johnathan Arnold. was seen today in follow up for Parkinsons disease.  My previous records were reviewed prior to todays visit as well as outside records available to me. pts wife with patient and supplements the hx.  Pt states that about 6 months ago he fell at sons house - none since that time.  Pt denies lightheadedness, near syncope.  No hallucinations.  Mood has been good.  After our last visit, we did order ferritin levels, which were a bit low.  We did that because of restless leg syndrome.  Told him to subsequently add ferrous sulfate/vitamin C for 3 months.  He didn't do that b/c was worried about constipation but he does state that rls is better.  Urology records  reviewed. C/o short term memory issues.  Wife complains of patient being easily angered/frustrated.  Patient admits that this is sometimes the case.  Current prescribed movement disorder medications: Carbidopa/levodopa 25/100, 1.5 tablet 3 times per day (increased last visit) Carbidopa/levodopa 50/200 CR at bedtime  Clonazepam, 0.5 mg, 1.5 tablets nightly(but he is only doing 1 tablet nightly - and doing well)    ALLERGIES:  No Known Allergies  CURRENT MEDICATIONS:  Outpatient Encounter Medications as of 05/17/2020  Medication Sig  . aspirin EC 81 MG tablet Take 162 mg by mouth at bedtime.  . carbidopa-levodopa (SINEMET CR) 50-200 MG tablet TAKE ONE TABLET BY MOUTH AT BEDTIME  . carbidopa-levodopa (SINEMET IR) 25-100 MG tablet TAKE 1 AND 1/2 TABLET BY MOUTH THREE TIMES A DAY  . carvedilol (COREG) 6.25 MG tablet TAKE ONE TABLET BY MOUTH TWICE A DAY  . clonazePAM (KLONOPIN) 0.5 MG tablet TAKE 1.5 TABLETS BY MOUTH EVERY NIGHT AT BEDTIME  . furosemide (LASIX) 20 MG tablet Take 1 tablet (20 mg total) by mouth daily.  . isosorbide mononitrate (IMDUR) 60 MG 24 hr tablet TAKE ONE TABLET BY MOUTH DAILY  . losartan (COZAAR) 25 MG tablet TAKE ONE TABLET BY MOUTH DAILY  . Melatonin 1 MG SUBL Place 1.5 mg under the tongue at bedtime.  . Multiple Vitamin (MULTIVITAMIN) tablet Take 1 tablet by mouth daily.  . nitroGLYCERIN (NITROSTAT) 0.4 MG SL tablet Place 1  tablet (0.4 mg total) under the tongue every 5 (five) minutes as needed for chest pain.  . Omega-3 Fatty Acids (FISH OIL) 1200 MG CAPS Take 1,200 mg by mouth daily.   Marland Kitchen omeprazole (PRILOSEC) 20 MG capsule TAKE ONE CAPSULE BY MOUTH EVERY MORNING  . polyethylene glycol (MIRALAX / GLYCOLAX) packet Take 17 g by mouth daily.   . ranolazine (RANEXA) 1000 MG SR tablet Take 1 tablet (1,000 mg total) by mouth 2 (two) times daily.  . tamsulosin (FLOMAX) 0.4 MG CAPS capsule TAKE ONE CAPSULE BY MOUTH DAILY  . rosuvastatin (CRESTOR) 10 MG tablet Take 1 tablet  (10 mg total) by mouth daily.  . [DISCONTINUED] clobetasol cream (TEMOVATE) 4.00 % Apply 1 application topically 2 (two) times daily. (Patient not taking: Reported on 05/17/2020)   No facility-administered encounter medications on file as of 05/17/2020.    Objective:   PHYSICAL EXAMINATION:    VITALS:   Vitals:   05/17/20 1451  BP: (!) 158/74  Pulse: (!) 50  SpO2: 98%  Weight: 191 lb (86.6 kg)  Height: 5\' 7"  (1.702 m)    GEN:  The patient appears stated age and is in NAD. HEENT:  Normocephalic, atraumatic.  The mucous membranes are moist. The superficial temporal arteries are without ropiness or tenderness.  Neurological examination:  Orientation: The patient is alert and oriented x3. Cranial nerves: There is good facial symmetry with facial hypomimia. The speech is fluent and clear. Soft palate rises symmetrically and there is no tongue deviation. Hearing is intact to conversational tone. Sensation: Sensation is intact to light touch throughout Motor: Strength is at least antigravity x4.  Movement examination: Tone: There is nl tone in the ue/le Abnormal movements: there is intermittent RUE rest tremor Coordination:  There is no decremation with RAM's, with any form of RAMS, including alternating supination and pronation of the forearm, hand opening and closing, finger taps, heel taps and toe taps. Gait and Station: The patient has no difficulty arising out of a deep-seated chair without the use of the hands. The patient's stride length is good.    I have reviewed and interpreted the following labs independently    Chemistry      Component Value Date/Time   NA 138 08/17/2019 1055   NA 142 01/14/2018 1031   NA 138 03/15/2014 1005   K 4.8 08/17/2019 1055   K 4.4 03/15/2014 1005   CL 101 08/17/2019 1055   CL 108 (H) 03/15/2014 1005   CO2 32 08/17/2019 1055   CO2 30 03/15/2014 1005   BUN 27 (H) 08/17/2019 1055   BUN 16 01/14/2018 1031   BUN 12 03/15/2014 1005    CREATININE 2.00 (H) 08/17/2019 1055   CREATININE 1.76 (H) 02/20/2017 1045   GLU 129 10/02/2016 0000      Component Value Date/Time   CALCIUM 9.5 08/17/2019 1055   CALCIUM 9.1 03/15/2014 1005   ALKPHOS 48 08/17/2019 1055   AST 18 08/17/2019 1055   ALT 16 08/17/2019 1055   BILITOT 0.7 08/17/2019 1055       Lab Results  Component Value Date   WBC 4.0 03/31/2019   HGB 11.3 (L) 03/31/2019   HCT 33.4 (L) 03/31/2019   MCV 99.4 03/31/2019   PLT 137 (L) 03/31/2019    Lab Results  Component Value Date   TSH 2.70 08/17/2019   Lab Results  Component Value Date   VITAMINB12 575 08/06/2015     Total time spent on today's visit was 40 minutes,  including both face-to-face time and nonface-to-face time.  Time included that spent on review of records (prior notes available to me/labs/imaging if pertinent), discussing treatment and goals, answering patient's questions and coordinating care.  Cc:  [name], MD

## 2020-05-16 ENCOUNTER — Ambulatory Visit: Payer: Medicare Other | Admitting: Neurology

## 2020-05-16 DIAGNOSIS — E119 Type 2 diabetes mellitus without complications: Secondary | ICD-10-CM | POA: Diagnosis not present

## 2020-05-17 ENCOUNTER — Encounter: Payer: Self-pay | Admitting: Neurology

## 2020-05-17 ENCOUNTER — Other Ambulatory Visit: Payer: Self-pay

## 2020-05-17 ENCOUNTER — Ambulatory Visit (INDEPENDENT_AMBULATORY_CARE_PROVIDER_SITE_OTHER): Payer: Medicare Other | Admitting: Neurology

## 2020-05-17 VITALS — BP 158/74 | HR 50 | Ht 67.0 in | Wt 191.0 lb

## 2020-05-17 DIAGNOSIS — G2581 Restless legs syndrome: Secondary | ICD-10-CM | POA: Diagnosis not present

## 2020-05-17 DIAGNOSIS — I6523 Occlusion and stenosis of bilateral carotid arteries: Secondary | ICD-10-CM | POA: Diagnosis not present

## 2020-05-17 DIAGNOSIS — I10 Essential (primary) hypertension: Secondary | ICD-10-CM | POA: Diagnosis not present

## 2020-05-17 DIAGNOSIS — D509 Iron deficiency anemia, unspecified: Secondary | ICD-10-CM

## 2020-05-17 DIAGNOSIS — R413 Other amnesia: Secondary | ICD-10-CM | POA: Diagnosis not present

## 2020-05-17 DIAGNOSIS — G20 Parkinson's disease: Secondary | ICD-10-CM | POA: Diagnosis not present

## 2020-05-17 NOTE — Patient Instructions (Signed)
You have been referred for a neurocognitive evaluation (i.e., evaluation of memory and thinking abilities). Please bring someone with you to this appointment if possible, as it is helpful for the neuropsychologist to hear from both you and another adult who knows you well. Please bring eyeglasses and hearing aids if you wear them.    The evaluation will take approximately 3 hours and has two parts:   . The first part is a clinical interview with the neuropsychologist, Dr. Merz or Dr. Stewart. During the interview, the neuropsychologist will speak with you and the individual you brought to the appointment.    . The second part of the evaluation is testing with the doctor's technician, aka psychometrician, Dana or Kim. During the testing, the technician will ask you to remember different types of material, solve problems, and answer some questionnaires. Your family member will not be present for this portion of the evaluation.   Please note: We have to reserve several hours of the neuropsychologist's time and the psychometrician's time for your evaluation appointment. As such, there is a No-Show fee of $100. If you are unable to attend any of your appointments, please contact our office as soon as possible to reschedule.   

## 2020-05-22 ENCOUNTER — Other Ambulatory Visit: Payer: Self-pay

## 2020-05-22 MED ORDER — ROSUVASTATIN CALCIUM 10 MG PO TABS: 10.0000 mg | ORAL_TABLET | Freq: Every day | ORAL | 3 refills | Status: DC

## 2020-06-12 DIAGNOSIS — I10 Essential (primary) hypertension: Secondary | ICD-10-CM | POA: Diagnosis not present

## 2020-06-12 DIAGNOSIS — D631 Anemia in chronic kidney disease: Secondary | ICD-10-CM | POA: Diagnosis not present

## 2020-06-12 DIAGNOSIS — R600 Localized edema: Secondary | ICD-10-CM | POA: Diagnosis not present

## 2020-06-12 DIAGNOSIS — I272 Pulmonary hypertension, unspecified: Secondary | ICD-10-CM | POA: Diagnosis not present

## 2020-06-12 DIAGNOSIS — N189 Chronic kidney disease, unspecified: Secondary | ICD-10-CM | POA: Diagnosis not present

## 2020-06-12 DIAGNOSIS — N1832 Chronic kidney disease, stage 3b: Secondary | ICD-10-CM | POA: Diagnosis not present

## 2020-06-12 DIAGNOSIS — E1122 Type 2 diabetes mellitus with diabetic chronic kidney disease: Secondary | ICD-10-CM | POA: Diagnosis not present

## 2020-06-18 ENCOUNTER — Other Ambulatory Visit: Payer: Self-pay | Admitting: Neurology

## 2020-06-18 NOTE — Telephone Encounter (Signed)
Rx(s) sent to pharmacy electronically.  

## 2020-06-27 ENCOUNTER — Telehealth: Payer: Self-pay | Admitting: Cardiovascular Disease

## 2020-06-27 MED ORDER — ISOSORBIDE MONONITRATE ER 60 MG PO TB24
60.0000 mg | ORAL_TABLET | Freq: Every day | ORAL | 0 refills | Status: DC
Start: 2020-06-27 — End: 2020-09-17

## 2020-06-27 NOTE — Telephone Encounter (Signed)
*  STAT* If patient is at the pharmacy, call can be transferred to refill team.   1. Which medications need to be refilled? (please list name of each medication and dose if known) isosorbide mononitrate (IMDUR) 60 MG   2. Which pharmacy/location (including street and city if local pharmacy) is medication to be sent to? [facility]   3. Do they need a 30 day or 90 day supply? 90 day   Patient states pharmacy says they have not received request

## 2020-06-27 NOTE — Telephone Encounter (Signed)
Requested Prescriptions   Signed Prescriptions Disp Refills   isosorbide mononitrate (IMDUR) 60 MG 24 hr tablet 90 tablet 0    Sig: Take 1 tablet (60 mg total) by mouth daily.    Authorizing Provider: [name] A    Ordering User: Raelene Bott, BRANDY L

## 2020-07-02 DIAGNOSIS — H43813 Vitreous degeneration, bilateral: Secondary | ICD-10-CM | POA: Diagnosis not present

## 2020-07-03 ENCOUNTER — Other Ambulatory Visit: Payer: Self-pay

## 2020-07-03 ENCOUNTER — Ambulatory Visit: Payer: Medicare Other

## 2020-07-03 ENCOUNTER — Ambulatory Visit (INDEPENDENT_AMBULATORY_CARE_PROVIDER_SITE_OTHER): Payer: Medicare Other | Admitting: Counselor

## 2020-07-03 ENCOUNTER — Encounter: Payer: Self-pay | Admitting: Counselor

## 2020-07-03 DIAGNOSIS — G20 Parkinson's disease: Secondary | ICD-10-CM | POA: Diagnosis not present

## 2020-07-03 DIAGNOSIS — F09 Unspecified mental disorder due to known physiological condition: Secondary | ICD-10-CM

## 2020-07-03 NOTE — Progress Notes (Signed)
   Psychometrist Note   Cognitive testing was administered to Johnathan Arnold. by Lamar Benes, B.S. (Technician) under the supervision of Alphonzo Severance, Psy.D., ABN. Johnathan Arnold was able to tolerate all test procedures. Dr. Nicole Kindred met with the patient as needed to manage any emotional reactions to the testing procedures. Rest breaks were offered.    The battery of tests administered was selected by Dr. Nicole Kindred with consideration to the patient's current level of functioning, the nature of his symptoms, emotional and behavioral responses during the interview, level of literacy, observed level of motivation/effort, and the nature of the referral question. This battery was communicated to the psychometrist. Communication between Dr. Nicole Kindred and the psychometrist was ongoing throughout the evaluation and Dr. Nicole Kindred was immediately accessible at all times. Dr. Nicole Kindred provided supervision to the technician on the date of this service, to the extent necessary to assure the quality of all services provided.    Johnathan Arnold will return in approximately one week for an interactive feedback session with Dr. Nicole Kindred, at which time test performance, clinical impressions, and treatment recommendations will be reviewed in detail. The patient understands he can contact our office should he require our assistance before this time.   A total of 95 minutes of billable time were spent with Johnathan Arnold. by the technician, including test administration and scoring time. Billing for these services is reflected in Dr. Les Pou note.   This note reflects time spent with the psychometrician and does not include test scores, clinical history, or any interpretations made by Dr. Nicole Kindred. The full report will follow in a separate note.

## 2020-07-03 NOTE — Progress Notes (Signed)
[facility]  Patient Name: Johnathan Arnold. MRN: [id number] Date of Birth: Feb 08, 1946 Age: 75 y.o. Education: 18 years  Referral Circumstances and Background Information  Johnathan Arnold is a 75 y.o., right-hand dominant, married male patient of Dr. Doristine Devoid who has been following with her since 2018 related to PD. He was previously following with Dr. Manuella Ghazi at [facility]. He has a medical history of DMII, CAD (s/p CABG x 5), HTN, CKD III, RLS, OSA on CPAP. Review of Dr. Doristine Devoid notes suggests he reported memory problems at his last visit and his wife also noted that he is more easily frustrated, prompting referral for evaluation.     The patient presented to the appointment with his wife, although she unfortunately has very significant expressive aphasia and thus her verbal participation was limited. She was able to give some input using a combination of words and paralinguistic communication. On interview, the patient reported that over the past several years, he doesn't feel like his short term memory is good. His wife thinks these issues have been worsening over time but the patient himself is not sure. He has always had a hard time remembering people's names, but that is worse now. He will get distracted in the middle of tasks and also misplaces things frequently. They are denying that he has any rapid forgetting of information, repeats himself, or has very profound memory problems. He notices having a hard time recalling names of actors in movies. On review of specific symptoms, he has word finding problems, he has no problems with pronouncing things or making speech sounds, and he has no significant difficulties with comprehension (although he does have hearing issues that are incompletely corrected). He is denying much in terms of problems with attention and concentration. He does acknowledge some problems with processing speed, he also has a harder  time with judgment, problem solving and decision making and will "flip flop" back and forth. His wife thinks he has problems with visuospatial functioning in terms of lane placement or parking, although he is denying that. They apparently frequently have disagreements about driving. With respect to mood, the patient reported that his mood is normal for him and he thinks he is doing ok. I asked his wife about her report that he gets irritable, although she was not able to elaborate on what she meant. His energy is not good and he feels like his stamina is low. He doesn't sleep well, his CPAP will not seal and then it keeps him and his wife up. He acts out his dreams "sometimes," he wasn't sure how long. He has thrown himself out of bed before. He would grab his wife. He is on Klonopin for this. His appetite is "so so" but he is not losing weight.   The patient and his wife agree that his difficulties are minimally impactful in terms of his day-to-day function, and there is nothing that he used to do that he cannot do now as a result of memory and thinking problems. He does have some limitations related to Parkinson's, he said he can no longer do home repair type things, he has a hard time with writing, and he wanted to make model airplanes but doesn't think he can related to his tremor. He is still managing the finances, managing his own medications, driving, and has no issues with any of those things. I specifically asked his wife about her comments regarding his driving, and she affirmed that  she has no concerns about his safety. His wife does most of the cooking. For hobbies, he flys model airplanes. Otherwise, he is doing things around the house or watching TV. He also enjoys reading.   Past Medical History and Review of Relevant Studies   Patient Active Problem List   Diagnosis Date Noted  . Low back pain of over 3 months duration 08/19/2019  . Pulmonary hypertension ([place])   . Insomnia 12/21/2018  .  Sleep apnea in adult 12/09/2018  . Periodic limb movement disorder 12/09/2018  . Abnormal findings on diagnostic imaging of lung 07/09/2018  . Lung nodule 07/09/2018  . Pulmonary nodules 05/18/2018  . Wears hearing aid in both ears 05/17/2018  . Leg pain, bilateral 02/20/2018  . Dyspnea   . Effort angina (HCC)   . CKD (chronic kidney disease) stage 3, GFR 30-59 ml/min (HCC) 09/21/2017  . History of skin cancer in adulthood 08/14/2016  . Parkinson's disease ([place]) 08/09/2016  . Bilateral carotid artery stenosis 04/02/2015  . Vertigo, peripheral 10/17/2014  . Benign prostatic hypertrophy with urinary frequency 01/31/2014  . Encounter for Medicare annual wellness exam 07/02/2013  . Obesity 04/03/2013  . Other malaise and fatigue 09/21/2012  . Hyperlipidemia   . Hypertension   . 3-vessel coronary artery disease   . S/P CABG x 5   . 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 Angina pectoris associated with type 2 diabetes mellitus ([place]) 05/21/2011    Review of Neuroimaging and Relevant Medical History: Patient has a CT head from 2018, which is his most recent neuroimaging. The study shows mild parenchymal volume loss and chronic leukoaraiosis, minimal and dubious even as a significant contributor to cognitive problems in my opinion.   MMSE: 08/17/2019 30/30  The patient denied any history of major head injuries, strokes, seizures, or neurological surgeries. He had a CABG x5 in 2000 and denied noticing any changes over that.   Patient had hypomimia, intermittent RUE rest tremor, and good RAMs on his last elemental neurological exam.   Current Outpatient Medications  Medication Sig Dispense Refill  . aspirin EC 81 MG tablet Take 162 mg by mouth at bedtime.    . carbidopa-levodopa (SINEMET CR) 50-200 MG tablet TAKE ONE TABLET BY MOUTH AT BEDTIME 90 tablet 3  . carbidopa-levodopa (SINEMET IR) 25-100 MG tablet TAKE 1.5 TABLETS BY MOUTH THREE TIMES A DAY  405 tablet 1  . carvedilol (COREG) 6.25 MG tablet TAKE ONE TABLET BY MOUTH TWICE A DAY 180 tablet 0  . clonazePAM (KLONOPIN) 0.5 MG tablet TAKE 1.5 TABLETS BY MOUTH EVERY NIGHT AT BEDTIME (Patient taking differently: Take 0.5 mg by mouth daily.) 45 tablet 3  . furosemide (LASIX) 20 MG tablet Take 1 tablet (20 mg total) by mouth daily. 90 tablet 3  . isosorbide mononitrate (IMDUR) 60 MG 24 hr tablet Take 1 tablet (60 mg total) by mouth daily. 90 tablet 0  . losartan (COZAAR) 25 MG tablet TAKE ONE TABLET BY MOUTH DAILY 90 tablet 1  . Melatonin 1 MG SUBL Place 1.5 mg under the tongue at bedtime.    . Multiple Vitamin (MULTIVITAMIN) tablet Take 1 tablet by mouth daily.    . nitroGLYCERIN (NITROSTAT) 0.4 MG SL tablet Place 1 tablet (0.4 mg total) under the tongue every 5 (five) minutes as needed for chest pain. 25 tablet 1  . Omega-3 Fatty Acids (FISH OIL) 1200 MG CAPS Take 1,200 mg by mouth daily.     Marland Kitchen omeprazole (  PRILOSEC) 20 MG capsule TAKE ONE CAPSULE BY MOUTH EVERY MORNING 90 capsule 0  . polyethylene glycol (MIRALAX / GLYCOLAX) packet Take 17 g by mouth daily.     . ranolazine (RANEXA) 1000 MG SR tablet Take 1 tablet (1,000 mg total) by mouth 2 (two) times daily. 60 tablet 3  . rosuvastatin (CRESTOR) 10 MG tablet Take 1 tablet (10 mg total) by mouth daily. 90 tablet 3  . tamsulosin (FLOMAX) 0.4 MG CAPS capsule TAKE ONE CAPSULE BY MOUTH DAILY 90 capsule 3   No current facility-administered medications for this visit.   Family History  Problem Relation Age of Onset  . Heart attack Mother 54  . Hypertension Mother   . Heart attack Father 89  . Heart disease Father   . Heart disease Brother   . Healthy Son    There is no  family history of dementia. There is no  family history of psychiatric illness.  Psychosocial History  Developmental, Educational and Employment History: The patient is a native of [place]. He described himself as an average student who was never held back and didn't  have any learning difficulties. 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 For work, he has mainly worked with computers, for several [employer]. 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   Psychiatric History: The patient denied any mental health history.   Substance Use History: The patient doesn't consume alcohol, smoke, or use any illicit substances.   Relationship History and Living Cimcumstances: The patient and his wife have been married for 59 years. They had three children, one of whom unfortunately died shortly after he was born. They have two children who live in [place].   Mental Status and Behavioral Observations  Sensorium/Arousal: The patient's level of arousal was awake and alert. Hearing and vision were marginally adewquate for testing purposes (patient did seem to mishear things and requested repetition at times). Orientation: The patient was alert and fully oriented to person, place, time and date.  Appearance: Dressed neatly in appropriate, casual clothing Behavior: Pleasant, appropriate, appeared adequately engaged during testing.  Speech/language: Speech was normal in rate, rhythm, volume, and prosody. He did have occasional word finding difficulties.  Gait/Posture: Not formally examined Movement: Intermittent RUE rest tremor noted Social Comportment: Pleasant, appropriate, did not present as irritable Mood: "Normal" Affect: Mainly neutral, some facial hypomimia Thought process/content: Thought process was logical, linear, and goal-oriented. Thought content was appropriate to the topics discussed.  Safety: No safety concerns identified in this encounter Insight: [place] Cognitive Assessment  07/03/2020  Visuospatial/ Executive (0/5) 2  Naming (0/3) 3  Attention: Read  list of digits (0/2) 2  Attention: Read list of letters (0/1) 1  Attention: Serial 7 subtraction starting at 100 (0/3) 2  Language: Repeat phrase (0/2) 0  Language : Fluency (0/1) 0  Abstraction (0/2) 2  Delayed Recall (0/5) 3  Orientation (0/6) 5  Total 20  Adjusted Score (based on education) 20   Test Procedures  Wide Range Achievement Test - 4             Word Reading Doy Mince' Intellectual Screening Test Neuropsychological Assessment Battery  List Learning  Story Learning  Daily Living Memory  Naming  Digit Span Repeatable Battery for the Assessment of Neuropsychological  Status (Form A)  Figure Copy  Judgment of Line Orientation  Coding  Figure Recall The Dot Counting Test A Random Letter Test Controlled Oral Word Association (F-A-S) Semantic Fluency (Animals) Trail Making Test A & B Complex Ideational Material Modified [place] Card Sorting Test Geriatric Depression Scale - Short Form  Plan  Roby Broomhead. was seen for a psychiatric diagnostic evaluation and neuropsychological testing. He is a pleasant, 75 year old, right-hand dominant gentleman with a history of PD for approximately 4.5 years. He and his wife are noticing some minor memory problems, forgetting names, and disorganization with day-to-day tasks and he was referred for evaluation. His wife also said he is irritable at times, although he presented as quite affable and did not seem irritable today. I do think he is frustrated by difficulties communicating (she has dense expressive aphasia). He is screening in the MCI range. Full and complete note with impressions, recommendations, and interpretation of test data to follow.   Viviano Simas Nicole Kindred, PsyD, [place] Clinical Neuropsychologist  Informed Consent and Coding/Compliance  Risks and benefits of the evaluation were discussed with the patient prior to all testing procedures. I conducted a clinical interview and neuropsychological testing (at least two  tests) with [name]. and Lamar Benes, B.S. (Technician) administered additional test procedures. The patient was able to tolerate the testing procedures and the patient (and/or family if applicable) is likely to benefit from further follow up to receive the diagnosis and treatment recommendations, which will be rendered at the next encounter. Billing below reflects technician time, my direct face-to-face time with the patient, time spent in test administration, and time spent in professional activities including but not limited to: neuropsychological test interpretation, integration of neuropsychological test data with clinical history, report preparation, treatment planning, care coordination, and review of diagnostically pertinent medical history or studies.   Services associated with this encounter: Clinical Interview [phone]) plus 60 minutes [id number]; Neuropsychological Evaluation by Professional)  120 minutes [id number]; Neuropsychological Evaluation by Professional, Adl.) 21 minutes [id number]; Test Administration by Professional) 30 minutes [id number]; Neuropsychological Testing by Technician) 65 minutes [id number]; Neuropsychological Testing by Technician, Adl.)

## 2020-07-04 DIAGNOSIS — N1832 Chronic kidney disease, stage 3b: Secondary | ICD-10-CM | POA: Diagnosis not present

## 2020-07-04 DIAGNOSIS — R809 Proteinuria, unspecified: Secondary | ICD-10-CM | POA: Diagnosis not present

## 2020-07-04 DIAGNOSIS — R600 Localized edema: Secondary | ICD-10-CM | POA: Diagnosis not present

## 2020-07-04 DIAGNOSIS — I10 Essential (primary) hypertension: Secondary | ICD-10-CM | POA: Diagnosis not present

## 2020-07-04 NOTE — Progress Notes (Unsigned)
[facility]  Patient Name: Johnathan Arnold. MRN: [id number] Date of Birth: Apr 28, 1946 Age: 75 y.o. Education: 18 years  Measurement properties of test scores: IQ, Index, and Standard Scores (SS): Mean = 100; Standard Deviation = 15 Scaled Scores (Ss): Mean = 10; Standard Deviation = 3 Z scores (Z): Mean = 0; Standard Deviation = 1 T scores (T); Mean = 50; Standard Deviation = 10  TEST SCORES:    Note: This summary of test scores accompanies the interpretive report and should not be interpreted by unqualified individuals or in isolation without reference to the report. Test scores are relative to age, gender, and educational history as available and appropriate.   Performance Validity        "A" Random Letter Test Raw  Descriptor      Errors 0 Within Expectation  The Dot Counting Test: 12 Within Expectation  NAB Effort Index 1 Within Expectation      Mental Status Screening     Total Score Descriptor  MoCA 20 MCI      Expected Functioning        Wide Range Achievement Test: Standard/Scaled Score Percentile      Word Reading 99 47      Attention/Processing Speed        Neuropsychological Assessment Battery (Attention Module, Form 1): T-score Percentile      Digits Forward 37 9      Digits Backwards 45 31      Repeatable Battery for the Assessment of Neuropsychological Status (Form A): Scaled Score Percentile      Coding 8 25      Language        Neuropsychological Assessment Battery (Language Module, Form 1): T-score Percentile      Naming   (31) 59 82      Verbal Fluency: T-score Percentile      Controlled Oral Word Association (F-A-S) 29 2      Semantic Fluency (Animals) 42 21      Memory:        Neuropsychological Assessment Battery (Memory Module, Form 1): T-score Percentile      List Learning           List A Immediate Recall   (5, 6, 6) 40 16         List B Immediate Recall   (3) 42 21         List A Short  Delayed Recall   (4) 35 7         List A Long Delayed Recall   (3) 32 4         List A Percent Retention   (75 %) --- 27         List A Long Delayed Yes/No Recognition Hits   (9) --- 18         List A Long Delayed Yes/No Recognition False Alarms   (4) --- 62         List A Recognition Discriminability Index --- 42      Story Learning           Immediate Recall   (18, 27) 36 8         Delayed Recall   (25) 40 16         Percent Retention   (93 %) --- 62      Daily Living Memory            Immediate Recall   (  19, 15) 38 12          Delayed Recall   (7, 5) 43 25          Percent Retention (86 %) --- 50          Recognition Hits    (8) --- 42      Repeatable Battery for the Assessment of Neuropsychological Status (Form A): Scaled Score Percentile         Figure Recall   (16) 13 84      Visuospatial/Constructional Functioning        Repeatable Battery for the Assessment of Neuropsychological Status (Form A): Standard/Scaled Score Percentile     Visuospatial/Constructional Index 102 55         Figure Copy   (20) 14 91         Judgment of Line Orientation   (13) --- 10-16      Executive Functioning        Modified [place] Card Sorting Test (MWCST): Standard/T-Score Percentile      Number of Categories Correct 57 75      Number of Perseverative Errors 66 95      Number of Total Errors 57 75      Percent Perseverative Errors 65 93  Executive Function Composite 119 90      Trail Making Test: T-Score Percentile      Part A 34 5      Part B 38 12      [place] Diagnostic Aphasia Exam: Raw Score Scaled Score      Complex Ideational Material 9 5      Clock Drawing Raw Score Descriptor      Command 8 Borderline Impairment      Rating Scales        Clinical Dementia Rating Raw Score Descriptor      Sum of Boxes 2.0 MCI      Global Score 0.5 MCI       Raw Score Descriptor  Geriatric Depression Scale - Short Form 4 Negative   Schyler Counsell V. Nicole Kindred PsyD, [place] Clinical  Neuropsychologist

## 2020-07-05 ENCOUNTER — Other Ambulatory Visit: Payer: Self-pay | Admitting: Internal Medicine

## 2020-07-05 NOTE — Progress Notes (Signed)
[facility]  Patient Name: Johnathan Arnold. MRN: [id number] Date of Birth: [birth year] Age: 75 y.o. Education: 18 years  Clinical Impressions and Recommendations  Johnathan Arnold. is a 75 y.o., right-hand dominant, married man with a history of PD for approximately 4.5 years, he has been following with Dr. Carles Collet since 2018 and she referred him for evaluation of memory problems. He has a medical history of HTN, CAD (s/p CABG x5), CKD III, RLS and OSA on CPAP. On interview, Johnathan Arnold is the primary one who is concerned about his memory problems, and he notices subtle changes in short term memory over the past several years. His wife unfortunately has fairly dense expressive aphasia related to a stroke although she presented as though she agrees with his assessment. He is still functioning well and PD is the principle cause of his day-to-day problems, not his memory and thinking problems.   On neuropsychological assessment there is suggestion of less than expected performance for an individual of average overall ability on memory testing, although the pattern highlights encoding as opposed to storage difficulties. Elsewhere, he demonstrated subtle frontal-subcortical type problems with diminished phonemic as compared to semantic fluency and borderline to low scores on most measures of executive function. He has no naming problems, no clear visuospatial problems (although he may have subtle emerging visuoperceptual problems), and no memory storage type difficulties. He screened negative for the presence of depression and did not present as irritable, although his wife thinks he is irritable.   Johnathan Arnold is thus demonstrating an MCI level problem and the pattern of difficulties and clinical history is consistent with the type of problems that are often encountered in the setting of Parkinson's Disease. These difficulties appear to be quite mild. He has no  putative "posterior-cortical" features suggestive of PDD or signs concerning for comorbid Alzheimer's disease. I concur with Dr. Carles Collet that his irritability may be related to his dynamic with his wife, she seemed a bit quick to point out his problems and does have a hard time communicating. His Parkinson's disease is already well managed under Dr. Doristine Devoid direction. He may further benefit from increasing his exercise. I will also discuss with them communication strategies that can be helpful for overcoming aphasia with his wife (e.g., using paralinguistic communication, providing context, speaking slowly and clearly). It would be reasonable for him to return in 2 years to follow his progress over time.     Diagnostic Impressions: Mild neurocognitive disorder due to Parkinson's disease  Test Findings  Test scores are summarized in additional documentation associated with this encounter. Test scores are relative to age, gender, and educational history as available and appropriate. There were no concerns about performance validity as all findings fell within normal expectations.   General Intellectual Functioning/Achievement:  Performance on single word reading was average, which presents as a reasonable standard of comparison for the patient's cognitive test data.   Attention and Processing Efficiency: Performance on indicators of attention and working memory was variable with weak low average digit repetition forward and average digit repetition backward. Serial subtraction of 7's from 100 was 3/5. These findings suggest some variability in attentional capacity.   Performance on processing speed assessment was average for timed number-symbol coding. By contrast, simple numeric sequencing was unusually low. This may reflect visual scanning problems or psychomotor variability.   Language: Language findings reflected intact performance on the fundamental ability of visual object confrontation naming. By  contrast, his verbal fluency  was extremely low in response to the letters F-A-S. Generation of animals in one minute was low average.   Visuospatial Function: Performance on visuospatial and constructional measures was generally reasonable, with an average score on the overall index. Figure copy was errorless, generating a superior score. His performance on judgment of angular line orientations was low average (almost unusually low).   Learning and Memory: Measures of learning and memory fell below expectations for an individual of average overall ability. The pattern shows primarily encoding problems and there is no memory storage difficulty.   In the verbal realm, he learned 5, 6, and 6 words of a 12-item word list across three learning trials. This was followed by 4 words at short delayed recall and 3 words at long delayed recall, which is unusually low although his percent retention over time was average. He did better with recognition cues, generating an average score. Memory for a short story was unusually low but he retained that information well and demonstrated a low average score on delayed recall. Memory for brief daily living type information was low average (almost unusually low) followed by low average delayed recall. Once again, retention across time was good and recognition was average.   In the visual realm, delayed recall of a modestly complex geometric figure was very good, in the high average range.   Executive Functions: Performance across the test battery suggested possible executive control problems including variability on attention measures, encoding difficulties and better delayed recognition than free recall on memory measures, and also marginal findings in this domain. Alternating sequencing of numbers and letters of the alphabet was low average (almost unusually low). Clock drawing was consistent with "borderline impairment" with hands in the wrong position and he initially  also made numbering errors but then redid them all. Reasoning with complex verbal information was unusually low on the Complex Ideational Material. The Modified [place] Card Sorting Test was a notable exception to this pattern, with a high average score and very good performance on the [employer] Composite.   Rating Scale(s): Mr. Ohms screened negative for the presence of depression. His CDR is a Sum of Boxes of 2.0 at maximum, which is mild cognitive impairment.   Viviano Simas Nicole Kindred PsyD, [place] Clinical Neuropsychologist

## 2020-07-10 ENCOUNTER — Ambulatory Visit (INDEPENDENT_AMBULATORY_CARE_PROVIDER_SITE_OTHER): Payer: Medicare Other | Admitting: Counselor

## 2020-07-10 ENCOUNTER — Encounter: Payer: Self-pay | Admitting: Counselor

## 2020-07-10 ENCOUNTER — Other Ambulatory Visit: Payer: Self-pay

## 2020-07-10 DIAGNOSIS — F0670 Mild neurocognitive disorder due to known physiological condition without behavioral disturbance: Secondary | ICD-10-CM

## 2020-07-10 DIAGNOSIS — G3184 Mild cognitive impairment, so stated: Secondary | ICD-10-CM | POA: Diagnosis not present

## 2020-07-10 DIAGNOSIS — G20 Parkinson's disease: Secondary | ICD-10-CM | POA: Diagnosis not present

## 2020-07-10 NOTE — Patient Instructions (Signed)
At your visit today, we discussed your presentation and performance on neuropsychological testing, which was consistent with "frontal-subcortical" type difficulties. These involve difficulties with things like controlling attention, shifting focus, and developing effective encoding sttrategies. These are the type of difficulties we usually encounter in Parkinson's disease and I think that is all that is going on.   I reassured you that there were no signs and symptoms concerning for comorbid Alzheimer's disease, which is positive. Of course we are all at risk as we age but you do not have any signs and symptoms that are concerning on your testing.   We discussed the importance of nutrition, and eating a varied diet rich in different sources of nutrients including high-protein foods, whole grains, vegetables, and the like.   We discussed the importance of exercise and you are already doing [employer] and you are also doing [employer], which is great, keep it up.   If you like, you can come back for reevaluation in several years to see how you are progressing.

## 2020-07-10 NOTE — Progress Notes (Signed)
NEUROPSYCHOLOGY FEEDBACK NOTE [facility]  Feedback Note: I met with [name]. to review the findings resulting from his neuropsychological evaluation. Since the last appointment, he has been about the same. Time was spent reviewing the impressions and recommendations that are detailed in the evaluation report. 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 He has no major posterior cortical features and no signs/symptoms particularly concerning for PDD. Discussed the role of attention and executive control in attenuating memory performance. I took time to explain the findings and answer all the patient's questions. I encouraged Mr. Joiner to contact me should he have any further questions or if further follow up is desired.   Current Medications and Medical History   Current Outpatient Medications  Medication Sig Dispense Refill  . aspirin EC 81 MG tablet Take 162 mg by mouth at bedtime.    . carbidopa-levodopa (SINEMET CR) 50-200 MG tablet TAKE ONE TABLET BY MOUTH AT BEDTIME 90 tablet 3  . carbidopa-levodopa (SINEMET IR) 25-100 MG tablet TAKE 1.5 TABLETS BY MOUTH THREE TIMES A DAY 405 tablet 1  . carvedilol (COREG) 6.25 MG tablet TAKE ONE TABLET BY MOUTH TWICE A DAY 180 tablet 0  . clonazePAM (KLONOPIN) 0.5 MG tablet TAKE 1.5 TABLETS BY MOUTH EVERY NIGHT AT BEDTIME (Patient taking differently: Take 0.5 mg by mouth daily.) 45 tablet 3  . furosemide (LASIX) 20 MG tablet Take 1 tablet (20 mg total) by mouth daily. 90 tablet 3  . isosorbide mononitrate (IMDUR) 60 MG 24 hr tablet Take 1 tablet (60 mg total) by mouth daily. 90 tablet 0  . losartan (COZAAR) 25 MG tablet TAKE ONE TABLET BY MOUTH DAILY 90 tablet 1  . Melatonin 1 MG SUBL Place 1.5 mg under the tongue at bedtime.    . Multiple Vitamin (MULTIVITAMIN) tablet Take 1 tablet by mouth daily.    . nitroGLYCERIN (NITROSTAT) 0.4 MG SL tablet Place 1 tablet (0.4 mg total) under the tongue  every 5 (five) minutes as needed for chest pain. 25 tablet 1  . Omega-3 Fatty Acids (FISH OIL) 1200 MG CAPS Take 1,200 mg by mouth daily.     Marland Kitchen omeprazole (PRILOSEC) 20 MG capsule TAKE ONE CAPSULE BY MOUTH EVERY MORNING 90 capsule 0  . polyethylene glycol (MIRALAX / GLYCOLAX) packet Take 17 g by mouth daily.     . ranolazine (RANEXA) 1000 MG SR tablet Take 1 tablet (1,000 mg total) by mouth 2 (two) times daily. 60 tablet 3  . rosuvastatin (CRESTOR) 10 MG tablet Take 1 tablet (10 mg total) by mouth daily. 90 tablet 3  . tamsulosin (FLOMAX) 0.4 MG CAPS capsule TAKE ONE CAPSULE BY MOUTH DAILY 90 capsule 3   No current facility-administered medications for this visit.    Patient Active Problem List   Diagnosis Date Noted  . Low back pain of over 3 months duration 08/19/2019  . Pulmonary hypertension ([place])   . Insomnia 12/21/2018  . Sleep apnea in adult 12/09/2018  . Periodic limb movement disorder 12/09/2018  . Abnormal findings on diagnostic imaging of lung 07/09/2018  . Lung nodule 07/09/2018  . Pulmonary nodules 05/18/2018  . Wears hearing aid in both ears 05/17/2018  . Leg pain, bilateral 02/20/2018  . Dyspnea   . Effort angina (HCC)   . CKD (chronic kidney disease) stage 3, GFR 30-59 ml/min (HCC) 09/21/2017  . History of skin cancer in adulthood 08/14/2016  . Parkinson's disease ([place]) 08/09/2016  . Bilateral  carotid artery stenosis 04/02/2015  . Vertigo, peripheral 10/17/2014  . Benign prostatic hypertrophy with urinary frequency 01/31/2014  . Encounter for Medicare annual wellness exam 07/02/2013  . Obesity 04/03/2013  . Other malaise and fatigue 09/21/2012  . Hyperlipidemia   . Hypertension   . 3-vessel coronary artery disease   . S/P CABG x 5   . 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  presented on time to the present encounter and was alert and generally oriented. Speech was normal in rate, rhythm, volume, and prosody. Self-reported mood was "good" and affect was blunted (hypomimia). Thought process was logical and goal-oriented and thought content was appropriate to the topics discussed. There were no safety concerns identified at today's encounter, such as thoughts of harming self or others.   Plan  Feedback provided regarding the patient's neuropsychological evaluation. I confirmed that he is already exercising quite a bit, he does [employer] 3x a week and is also still engaged in [employer] 3x a week. Also counceled him regarding communication strategies that may be helpful to use with his wife. [name]. was encouraged to contact me if any questions arise or if further follow up is desired.   Viviano Simas Nicole Kindred, PsyD, [employer] Clinical Neuropsychologist  Service(s) Provided at This Encounter: 38 minutes [phone]; Conjoint therapy with patient present)

## 2020-07-26 ENCOUNTER — Other Ambulatory Visit: Payer: Self-pay | Admitting: Cardiovascular Disease

## 2020-07-27 NOTE — Telephone Encounter (Signed)
Rx request sent to pharmacy.  

## 2020-07-28 ENCOUNTER — Other Ambulatory Visit: Payer: Self-pay | Admitting: Cardiovascular Disease

## 2020-08-06 ENCOUNTER — Other Ambulatory Visit: Payer: Self-pay | Admitting: Cardiovascular Disease

## 2020-08-15 DIAGNOSIS — Z03818 Encounter for observation for suspected exposure to other biological agents ruled out: Secondary | ICD-10-CM | POA: Diagnosis not present

## 2020-08-15 DIAGNOSIS — Z20822 Contact with and (suspected) exposure to covid-19: Secondary | ICD-10-CM | POA: Diagnosis not present

## 2020-08-17 ENCOUNTER — Ambulatory Visit (INDEPENDENT_AMBULATORY_CARE_PROVIDER_SITE_OTHER): Payer: Medicare Other

## 2020-08-17 VITALS — Ht 67.0 in | Wt 191.0 lb

## 2020-08-17 DIAGNOSIS — Z0000 Encounter for general adult medical examination without abnormal findings: Secondary | ICD-10-CM

## 2020-08-17 NOTE — Patient Instructions (Addendum)
Johnathan Arnold , Thank you for taking time to come for your Medicare Wellness Visit. I appreciate your ongoing commitment to your health goals. Please review the following plan we discussed and let me know if I can assist you in the future.   These are the goals we discussed: Goals      Patient Stated   .  Healthy Lifestyle (pt-stated)      Stay active        This is a list of the screening recommended for you and due dates:  Health Maintenance  Topic Date Due  . Hemoglobin A1C  02/17/2020  . Complete foot exam   08/16/2020  . Tetanus Vaccine  12/18/2020  . Eye exam for diabetics  06/23/2021  . Cologuard (Stool DNA test)  09/01/2022  . Flu Shot  Completed  . COVID-19 Vaccine  Completed  .  Hepatitis C: One time screening is recommended by Center for Disease Control  (CDC) for  adults born from [birth year] through 1966.   Completed  . Pneumonia vaccines  Completed  . HPV Vaccine  Aged Out    Advanced directives: End of life planning; Advance aging; Advanced directives discussed.  Copy of current HCPOA/Living Will requested.    Conditions/risks identified: none new.  Follow up in one year for your annual wellness visit.   Preventive Care 4 Years and Older, Male Preventive care refers to lifestyle choices and visits with your health care provider that can promote health and wellness. What does preventive care include?  A yearly physical exam. This is also called an annual well check.  Dental exams once or twice a year.  Routine eye exams. Ask your health care provider how often you should have your eyes checked.  Personal lifestyle choices, including:  Daily care of your teeth and gums.  Regular physical activity.  Eating a healthy diet.  Avoiding tobacco and drug use.  Limiting alcohol use.  Practicing safe sex.  Taking low doses of aspirin every day.  Taking vitamin and mineral supplements as recommended by your health care provider. What happens during an annual  well check? The services and screenings done by your health care provider during your annual well check will depend on your age, overall health, lifestyle risk factors, and family history of disease. Counseling  Your health care provider may ask you questions about your:  Alcohol use.  Tobacco use.  Drug use.  Emotional well-being.  Home and relationship well-being.  Sexual activity.  Eating habits.  History of falls.  Memory and ability to understand (cognition).  Work and work Statistician. Screening  You may have the following tests or measurements:  Height, weight, and BMI.  Blood pressure.  Lipid and cholesterol levels. These may be checked every 5 years, or more frequently if you are over 68 years old.  Skin check.  Lung cancer screening. You may have this screening every year starting at age 77 if you have a 30-pack-year history of smoking and currently smoke or have quit within the past 15 years.  Fecal occult blood test (FOBT) of the stool. You may have this test every year starting at age 40.  Flexible sigmoidoscopy or colonoscopy. You may have a sigmoidoscopy every 5 years or a colonoscopy every 10 years starting at age 28.  Prostate cancer screening. Recommendations will vary depending on your family history and other risks.  Hepatitis C blood test.  Hepatitis B blood test.  Sexually transmitted disease (STD) testing.  Diabetes screening. This  is done by checking your blood sugar (glucose) after you have not eaten for a while (fasting). You may have this done every 1-3 years.  Abdominal aortic aneurysm (AAA) screening. You may need this if you are a current or former smoker.  Osteoporosis. You may be screened starting at age 71 if you are at high risk. Talk with your health care provider about your test results, treatment options, and if necessary, the need for more tests. Vaccines  Your health care provider may recommend certain vaccines, such  as:  Influenza vaccine. This is recommended every year.  Tetanus, diphtheria, and acellular pertussis (Tdap, Td) vaccine. You may need a Td booster every 10 years.  Zoster vaccine. You may need this after age 75.  Pneumococcal 13-valent conjugate (PCV13) vaccine. One dose is recommended after age 67.  Pneumococcal polysaccharide (PPSV23) vaccine. One dose is recommended after age 64. Talk to your health care provider about which screenings and vaccines you need and how often you need them. This information is not intended to replace advice given to you by your health care provider. Make sure you discuss any questions you have with your health care provider. Document Released: 06/22/2015 Document Revised: 02/13/2016 Document Reviewed: 03/27/2015 Elsevier Interactive Patient Education  2017 [place] Prevention in the Home Falls can cause injuries. They can happen to people of all ages. There are many things you can do to make your home safe and to help prevent falls. What can I do on the outside of my home?  Regularly fix the edges of walkways and driveways and fix any cracks.  Remove anything that might make you trip as you walk through a door, such as a raised step or threshold.  Trim any bushes or trees on the path to your home.  Use bright outdoor lighting.  Clear any walking paths of anything that might make someone trip, such as rocks or tools.  Regularly check to see if handrails are loose or broken. Make sure that both sides of any steps have handrails.  Any raised decks and porches should have guardrails on the edges.  Have any leaves, snow, or ice cleared regularly.  Use sand or salt on walking paths during winter.  Clean up any spills in your garage right away. This includes oil or grease spills. What can I do in the bathroom?  Use night lights.  Install grab bars by the toilet and in the tub and shower. Do not use towel bars as grab bars.  Use  non-skid mats or decals in the tub or shower.  If you need to sit down in the shower, use a plastic, non-slip stool.  Keep the floor dry. Clean up any water that spills on the floor as soon as it happens.  Remove soap buildup in the tub or shower regularly.  Attach bath mats securely with double-sided non-slip rug tape.  Do not have throw rugs and other things on the floor that can make you trip. What can I do in the bedroom?  Use night lights.  Make sure that you have a light by your bed that is easy to reach.  Do not use any sheets or blankets that are too big for your bed. They should not hang down onto the floor.  Have a firm chair that has side arms. You can use this for support while you get dressed.  Do not have throw rugs and other things on the floor that can make you trip.  What can I do in the kitchen?  Clean up any spills right away.  Avoid walking on wet floors.  Keep items that you use a lot in easy-to-reach places.  If you need to reach something above you, use a strong step stool that has a grab bar.  Keep electrical cords out of the way.  Do not use floor polish or wax that makes floors slippery. If you must use wax, use non-skid floor wax.  Do not have throw rugs and other things on the floor that can make you trip. What can I do with my stairs?  Do not leave any items on the stairs.  Make sure that there are handrails on both sides of the stairs and use them. Fix handrails that are broken or loose. Make sure that handrails are as long as the stairways.  Check any carpeting to make sure that it is firmly attached to the stairs. Fix any carpet that is loose or worn.  Avoid having throw rugs at the top or bottom of the stairs. If you do have throw rugs, attach them to the floor with carpet tape.  Make sure that you have a light switch at the top of the stairs and the bottom of the stairs. If you do not have them, ask someone to add them for you. What  else can I do to help prevent falls?  Wear shoes that:  Do not have high heels.  Have rubber bottoms.  Are comfortable and fit you well.  Are closed at the toe. Do not wear sandals.  If you use a stepladder:  Make sure that it is fully opened. Do not climb a closed stepladder.  Make sure that both sides of the stepladder are locked into place.  Ask someone to hold it for you, if possible.  Clearly mark and make sure that you can see:  Any grab bars or handrails.  First and last steps.  Where the edge of each step is.  Use tools that help you move around (mobility aids) if they are needed. These include:  Canes.  Walkers.  Scooters.  Crutches.  Turn on the lights when you go into a dark area. Replace any light bulbs as soon as they burn out.  Set up your furniture so you have a clear path. Avoid moving your furniture around.  If any of your floors are uneven, fix them.  If there are any pets around you, be aware of where they are.  Review your medicines with your doctor. Some medicines can make you feel dizzy. This can increase your chance of falling. Ask your doctor what other things that you can do to help prevent falls. This information is not intended to replace advice given to you by your health care provider. Make sure you discuss any questions you have with your health care provider. Document Released: 03/22/2009 Document Revised: 11/01/2015 Document Reviewed: 06/30/2014 Elsevier Interactive Patient Education  2017 [employer].

## 2020-08-17 NOTE — Progress Notes (Addendum)
Subjective:   Johnathan Arnold. is a 75 y.o. male who presents for Medicare Annual/Subsequent preventive examination.  Review of Systems    No ROS.  Medicare Wellness Virtual Visit.    Cardiac Risk Factors include: advanced age (>58men, >66 women);male gender;hypertension;diabetes mellitus     Objective:    Today's Vitals   08/17/20 1102  Weight: 191 lb (86.6 kg)  Height: 5\' 7"  (1.702 m)   Body mass index is 29.91 kg/m.  Advanced Directives 08/17/2020 05/17/2020 12/06/2019 08/17/2019 04/04/2019 01/25/2019 08/13/2018  Does Patient Have a Medical Advance Directive? Yes Yes Yes Yes Yes Yes Yes  Type of Paramedic of [place];Living will [place];Living will [place];Living will [place];Living will Healthcare Power of Attorney Living will;Healthcare Power of [place];Living will  Does patient want to make changes to medical advance directive? No - Patient declined - - No - Patient declined No - Patient declined - No - Patient declined  Copy of [place] in Chart? No - copy requested - - No - copy requested No - copy requested - No - copy requested  Would patient like information on creating a medical advance directive? - - - - - - -    Current Medications (verified) Outpatient Encounter Medications as of 08/17/2020  Medication Sig   aspirin EC 81 MG tablet Take 162 mg by mouth at bedtime.   carbidopa-levodopa (SINEMET CR) 50-200 MG tablet TAKE ONE TABLET BY MOUTH AT BEDTIME   carbidopa-levodopa (SINEMET IR) 25-100 MG tablet TAKE 1.5 TABLETS BY MOUTH THREE TIMES A DAY   carvedilol (COREG) 6.25 MG tablet TAKE ONE TABLET BY MOUTH TWICE A DAY   clonazePAM (KLONOPIN) 0.5 MG tablet TAKE 1.5 TABLETS BY MOUTH EVERY NIGHT AT BEDTIME (Patient taking differently: Take 0.5 mg by mouth daily.)   furosemide (LASIX) 20 MG tablet Take 1 tablet (20 mg total) by mouth  daily.   isosorbide mononitrate (IMDUR) 60 MG 24 hr tablet Take 1 tablet (60 mg total) by mouth daily.   losartan (COZAAR) 25 MG tablet TAKE ONE TABLET BY MOUTH DAILY   Melatonin 1 MG SUBL Place 1.5 mg under the tongue at bedtime.   Multiple Vitamin (MULTIVITAMIN) tablet Take 1 tablet by mouth daily.   nitroGLYCERIN (NITROSTAT) 0.4 MG SL tablet Place 1 tablet (0.4 mg total) under the tongue every 5 (five) minutes as needed for chest pain.   Omega-3 Fatty Acids (FISH OIL) 1200 MG CAPS Take 1,200 mg by mouth daily.    omeprazole (PRILOSEC) 20 MG capsule TAKE ONE CAPSULE BY MOUTH EVERY MORNING   polyethylene glycol (MIRALAX / GLYCOLAX) packet Take 17 g by mouth daily.    ranolazine (RANEXA) 1000 MG SR tablet Take 1 tablet (1,000 mg total) by mouth 2 (two) times daily. Please schedule office visit for further refills. Thank you!   rosuvastatin (CRESTOR) 10 MG tablet Take 1 tablet (10 mg total) by mouth daily.   tamsulosin (FLOMAX) 0.4 MG CAPS capsule TAKE ONE CAPSULE BY MOUTH DAILY   No facility-administered encounter medications on file as of 08/17/2020.    Allergies (verified) Patient has no known allergies.   History: Past Medical History:  Diagnosis Date   3-vessel coronary artery disease    s/p  5 vessel CABG   Diabetes mellitus without complication ([place])    History of cardiac catheterization 2011   [facility]   Hyperlipidemia    Hypertension  Hypertriglyceridemia    Parkinson's disease ([place])    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 LIMA to LAD, SVG to D1, OM1 and RPDA   CORONARY ARTERY BYPASS GRAFT  03/1998   5 vessel, [facility]   RIGHT HEART CATH N/A 04/04/2019   Procedure: RIGHT HEART CATH;  Surgeon: Wellington Hampshire, MD;  Location: [facility];  Service: Cardiovascular;  Laterality: N/A;   RIGHT/LEFT HEART CATH AND CORONARY ANGIOGRAPHY N/A 02/01/2018    Procedure: RIGHT/LEFT HEART CATH AND CORONARY ANGIOGRAPHY;  Surgeon: Wellington Hampshire, MD;  Location: [facility];  Service: Cardiovascular;  Laterality: N/A;   Family History  Problem Relation Age of Onset   Heart attack Mother 8   Hypertension Mother    Heart attack Father 46   Heart disease Father    Heart disease Brother    Healthy Son    Social History   Socioeconomic History   Marital status: Married    Spouse name: Not on file   Number of children: 2   Years of education: Not on file   Highest education level: Master's degree (e.g., MA, MS, MEng, MEd, MSW, MBA)  Occupational History   Occupation: retired    Comment: IT work  Tobacco Use   Smoking status: Passive Smoke Exposure - Never Smoker   Smokeless tobacco: Never Used  Scientific laboratory technician Use: Never used  Substance and Sexual Activity   Alcohol use: Yes    Comment: occasional beer   Drug use: No   Sexual activity: Not Currently  Other Topics Concern   Not on file  Social History Narrative   Not on file   Social Determinants of Health   Financial Resource Strain: Low Risk    Difficulty of Paying Living Expenses: Not hard at all  Food Insecurity: No Food Insecurity   Worried About Charity fundraiser in the Last Year: Never true   Arboriculturist in the Last Year: Never true  Transportation Needs: No Transportation Needs   Lack of Transportation (Medical): No   Lack of Transportation (Non-Medical): No  Physical Activity: Sufficiently Active   Days of Exercise per Week: 3 days   Minutes of Exercise per Session: 60 min  Stress: No Stress Concern Present   Feeling of Stress : Not at all  Social Connections: Unknown   Frequency of Communication with Friends and Family: More than three times a week   Frequency of Social Gatherings with Friends and Family: More than three times a week   Attends Religious Services: Not on Electrical engineer or Organizations: Yes   Attends Programme researcher, broadcasting/film/video: More than 4 times per year   Marital Status: Married    Tobacco Counseling Counseling given: Not Answered   Clinical Intake:  Pre-visit preparation completed: Yes        Diabetes: Yes (Followed by pcp)     Nutrition Risk Assessment: Has the patient had any N/V/D within the last 2 months?  No  Does the patient have any non-healing wounds?  No  Has the patient had any unintentional weight loss or weight gain?  No   Diabetes: If diabetic, was a CBG obtained today?  No  Did the patient bring in their glucometer from home?  No  How often do you monitor  your CBG's? In office only.   Financial Strains and Diabetes Management: Are you having any financial strains with the device, your supplies or your medication? No .  Does the patient want to be seen by Chronic Care Management for management of their diabetes?  No  Would the patient like to be referred to a Nutritionist or for Diabetic Management?  No   Interpreter Needed?: No      Activities of Daily Living In your present state of health, do you have any difficulty performing the following activities: 08/17/2020  Hearing? Y  Comment Hearing aids  Vision? N  Difficulty concentrating or making decisions? N  Walking or climbing stairs? N  Dressing or bathing? N  Doing errands, shopping? N  Preparing Food and eating ? N  Using the Toilet? N  In the past six months, have you accidently leaked urine? N  Do you have problems with loss of bowel control? N  Managing your Medications? N  Managing your Finances? N  Housekeeping or managing your Housekeeping? N  Some recent data might be hidden    Patient Care Team: [name], MD as PCP - General (Internal Medicine) Isaias Cowman, MD (Internal Medicine) Tat, Eustace Quail, DO as Consulting Physician (Neurology)  Indicate any recent Medical Services you may have received from other than [facility] providers in the past year (date may be  approximate).     Assessment:   This is a routine wellness examination for Ramzy.  I connected with Leslie today by telephone and verified that I am speaking with the correct person using two identifiers. Location patient: home Location provider: work Persons participating in the virtual visit: patient, Marine scientist.    I discussed the limitations, risks, security and privacy concerns of performing an evaluation and management service by telephone and the availability of in person appointments. The patient expressed understanding and verbally consented to this telephonic visit.    Interactive audio and video telecommunications were attempted between this provider and patient, however failed, due to patient having technical difficulties OR patient did not have access to video capability.  We continued and completed visit with audio only.  Some vital signs may be absent or patient reported.   Hearing/Vision screen  Hearing Screening   125Hz  250Hz  500Hz  1000Hz  2000Hz  3000Hz  4000Hz  6000Hz  8000Hz   Right ear:           Left ear:           Comments: Hearing aids  Vision Screening Comments: Wears corrective lenses  Visual acuity not assessed, virtual visit. They have seen their ophthalmologist in the last 6-12 months.   Dietary issues and exercise activities discussed: Current Exercise Habits: Home exercise routine, Time (Minutes): 60, Frequency (Times/Week): 3, Weekly Exercise (Minutes/Week): 180  Healthy diet  Fair water intake  Goals       Patient Stated     Healthy Lifestyle (pt-stated)      Stay active        Depression Screen PHQ 2/9 Scores 08/17/2020 08/17/2019 08/13/2018 08/11/2017 08/05/2016 03/03/2015 10/07/2013  PHQ - 2 Score 0 0 0 0 0 0 0  PHQ- 9 Score - 1 - - - - -    Fall Risk Fall Risk  08/17/2020 05/17/2020 12/06/2019 08/17/2019 08/17/2019  Falls in the past year? 0 1 0 0 0  Number falls in past yr: 0 1 0 - -  Injury with Fall? 0 0 0 - -  Risk for fall due to : - - - - -  Follow up Falls evaluation completed - - Falls evaluation completed Falls evaluation completed    FALL RISK PREVENTION PERTAINING TO THE HOME: Handrails in use when climbing stairs? Yes Home free of loose throw rugs in walkways, pet beds, electrical cords, etc? Yes  Adequate lighting in your home to reduce risk of falls? Yes   ASSISTIVE DEVICES UTILIZED TO PREVENT FALLS: Use of a cane, walker or w/c? No   TIMED UP AND GO: Was the test performed? No . Virtual visit.   Cognitive Function: Patient is alert and oriented x3.  MMSE/6CIT deferred. Normal by direct communication/observation.  MMSE - Mini Mental State Exam 08/05/2016  Orientation to time 5  Orientation to Place 5  Registration 3  Attention/ Calculation 5  Recall 3  Language- name 2 objects 2  Language- repeat 1  Language- follow 3 step command 3  Language- read & follow direction 1  Write a sentence 1  Copy design 1  Total score 30   Montreal Cognitive Assessment  07/03/2020  Visuospatial/ Executive (0/5) 2  Naming (0/3) 3  Attention: Read list of digits (0/2) 2  Attention: Read list of letters (0/1) 1  Attention: Serial 7 subtraction starting at 100 (0/3) 2  Language: Repeat phrase (0/2) 0  Language : Fluency (0/1) 0  Abstraction (0/2) 2  Delayed Recall (0/5) 3  Orientation (0/6) 5  Total 20  Adjusted Score (based on education) 20   6CIT Screen 08/17/2019 08/13/2018 08/11/2017  What Year? 0 points 0 points 0 points  What month? 0 points 0 points 0 points  What time? 0 points 0 points 0 points  Count back from 20 0 points 0 points 0 points  Months in reverse 0 points 0 points 0 points  Repeat phrase 0 points 0 points -  Total Score 0 0 -    Immunizations Immunization History  Administered Date(s) Administered   Fluad Quad(high Dose 65+) 03/09/2019   Influenza Inj Mdck Quad Pf 03/01/2020   Influenza Split 02/19/2011, 04/10/2014   Influenza, High Dose Seasonal PF 04/01/2013, 02/28/2015, 05/17/2018    Influenza,inj,Quad PF,6+ Mos 03/10/2016, 03/13/2017   Influenza-Unspecified 03/09/2012   PFIZER(Purple Top)SARS-COV-2 Vaccination 07/15/2019, 08/05/2019, 04/01/2020   Pneumococcal Conjugate-13 06/30/2013   Pneumococcal Polysaccharide-23 05/21/2011, 08/06/2016   Tdap 12/19/2010   Zoster 05/09/2012   Health Maintenance Health Maintenance  Topic Date Due   HEMOGLOBIN A1C  02/17/2020   FOOT EXAM  08/16/2020   TETANUS/TDAP  12/18/2020   OPHTHALMOLOGY EXAM  06/23/2021   Fecal DNA (Cologuard)  09/01/2022   INFLUENZA VACCINE  Completed   COVID-19 Vaccine  Completed   Hepatitis C Screening  Completed   PNA vac Low Risk Adult  Completed   HPV VACCINES  Aged Out   Colorectal cancer screening: Type of screening: Cologuard. Completed 09/01/19. Repeat every 3 years  CT Chest WO Contrast: completed 11/17/19.  Vision Screening: Recommended annual ophthalmology exams for early detection of glaucoma and other disorders of the eye. Is the patient up to date with their annual eye exam?  Yes  Who is the provider or what is the name of the office in which the patient attends annual eye exams? [facility]  Dental Screening: Recommended annual dental exams for proper oral hygiene.  Community Resource Referral / Chronic Care Management: CRR required this visit?  No   CCM required this visit?  No      Plan:   Keep all routine maintenance appointments.   Follow up 09/06/20 @ 9:30  I  have personally reviewed and noted the following in the patient's chart:   Medical and social history Use of alcohol, tobacco or illicit drugs  Current medications and supplements Functional ability and status Nutritional status Physical activity Advanced directives List of other physicians Hospitalizations, surgeries, and ER visits in previous 12 months Vitals Screenings to include cognitive, depression, and falls Referrals and appointments  In addition, I have reviewed and discussed with patient certain  preventive protocols, quality metrics, and best practice recommendations. A written personalized care plan for preventive services as well as general preventive health recommendations were provided to patient via mychart.     OBrien-Blaney, Tadao Emig L, LPN   08/26/2332    I have reviewed the above information and agree with above.   Deborra Medina, MD

## 2020-08-29 ENCOUNTER — Other Ambulatory Visit: Payer: Self-pay | Admitting: Neurology

## 2020-09-06 ENCOUNTER — Encounter: Payer: Self-pay | Admitting: Internal Medicine

## 2020-09-06 ENCOUNTER — Ambulatory Visit (INDEPENDENT_AMBULATORY_CARE_PROVIDER_SITE_OTHER): Payer: Medicare Other | Admitting: Internal Medicine

## 2020-09-06 ENCOUNTER — Other Ambulatory Visit: Payer: Self-pay

## 2020-09-06 VITALS — BP 160/70 | HR 51 | Temp 98.3°F | Resp 14 | Ht 67.0 in | Wt 189.2 lb

## 2020-09-06 DIAGNOSIS — Z125 Encounter for screening for malignant neoplasm of prostate: Secondary | ICD-10-CM

## 2020-09-06 DIAGNOSIS — N1831 Chronic kidney disease, stage 3a: Secondary | ICD-10-CM | POA: Diagnosis not present

## 2020-09-06 DIAGNOSIS — F0670 Mild neurocognitive disorder due to known physiological condition without behavioral disturbance: Secondary | ICD-10-CM

## 2020-09-06 DIAGNOSIS — I208 Other forms of angina pectoris: Secondary | ICD-10-CM

## 2020-09-06 DIAGNOSIS — E7800 Pure hypercholesterolemia, unspecified: Secondary | ICD-10-CM | POA: Diagnosis not present

## 2020-09-06 DIAGNOSIS — G20 Parkinson's disease: Secondary | ICD-10-CM

## 2020-09-06 DIAGNOSIS — I10 Essential (primary) hypertension: Secondary | ICD-10-CM

## 2020-09-06 DIAGNOSIS — E1121 Type 2 diabetes mellitus with diabetic nephropathy: Secondary | ICD-10-CM

## 2020-09-06 DIAGNOSIS — G3184 Mild cognitive impairment, so stated: Secondary | ICD-10-CM

## 2020-09-06 LAB — COMPREHENSIVE METABOLIC PANEL
ALT: 7 U/L (ref 0–53)
AST: 16 U/L (ref 0–37)
Albumin: 4.3 g/dL (ref 3.5–5.2)
Alkaline Phosphatase: 50 U/L (ref 39–117)
BUN: 22 mg/dL (ref 6–23)
CO2: 28 mEq/L (ref 19–32)
Calcium: 9.1 mg/dL (ref 8.4–10.5)
Chloride: 103 mEq/L (ref 96–112)
Creatinine, Ser: 1.92 mg/dL — ABNORMAL HIGH (ref 0.40–1.50)
GFR: 33.62 mL/min — ABNORMAL LOW (ref >60.00)
Glucose, Bld: 117 mg/dL — ABNORMAL HIGH (ref 70–99)
Potassium: 4.7 mEq/L (ref 3.5–5.1)
Sodium: 139 mEq/L (ref 135–145)
Total Bilirubin: 0.8 mg/dL (ref 0.2–1.2)
Total Protein: 6.4 g/dL (ref 6.0–8.3)

## 2020-09-06 LAB — LIPID PANEL
Cholesterol: 121 mg/dL (ref 0–200)
HDL: 65.3 mg/dL (ref >39.00)
LDL Cholesterol: 40 mg/dL (ref 0–99)
NonHDL: 55.37
Total CHOL/HDL Ratio: 2
Triglycerides: 77 mg/dL (ref 0.0–149.0)
VLDL: 15.4 mg/dL (ref 0.0–40.0)

## 2020-09-06 LAB — HEMOGLOBIN A1C: Hgb A1c MFr Bld: 5.8 % (ref 4.6–6.5)

## 2020-09-06 LAB — PSA, MEDICARE: PSA: 1.1 ng/ml (ref 0.10–4.00)

## 2020-09-06 MED ORDER — LOSARTAN POTASSIUM 50 MG PO TABS: 100.0000 mg | ORAL_TABLET | Freq: Every day | ORAL | 1 refills | Status: DC

## 2020-09-06 NOTE — Progress Notes (Signed)
Patient ID: Johnathan Arnold., male    DOB: 1946-01-20  Age: 75 y.o. MRN: [id number]  The patient is here for follow up and management of other chronic and acute problems.  This visit occurred during the SARS-CoV-2 public health emergency.  Safety protocols were in place, including screening questions prior to the visit, additional usage of staff PPE, and extensive cleaning of exam room while observing appropriate contact time as indicated for disinfecting solutions.      The risk factors are reflected in the social history.  The roster of all physicians providing medical care to patient - is listed in the Snapshot section of the chart.  Activities of daily living:  The patient is 100% independent in all ADLs: dressing, toileting, feeding as well as independent mobility  Home safety : The patient has smoke detectors in the home. They wear seatbelts.  There are no firearms at home. There is no violence in the home.   There is no risks for hepatitis, STDs or HIV. There is no   history of blood transfusion. They have no travel history to infectious disease endemic areas of the world.  The patient has seen their dentist in the last six month. They have seen their eye doctor in the last year. They admit to slight hearing difficulty with regard to whispered voices and some television programs.  They have deferred audiologic testing in the last year.  They do not  have excessive sun exposure. Discussed the need for sun protection: hats, long sleeves and use of sunscreen if there is significant sun exposure.   Diet: the importance of a healthy diet is discussed. They do have a healthy diet.  The benefits of regular aerobic exercise were discussed. She walks 4 times per week ,  20 minutes.   Depression screen: there are no signs or vegative symptoms of depression- irritability, change in appetite, anhedonia, sadness/tearfullness.  Cognitive assessment: the patient manages all their financial and  personal affairs and is actively engaged. They could relate day,date,year and events; recalled 2/3 objects at 3 minutes; performed clock-face test normally.  The following portions of the patient's history were reviewed and updated as appropriate: allergies, current medications, past family history, past medical history,  past surgical history, past social history  and problem list.  Visual acuity was not assessed per patient preference since she has regular follow up with her ophthalmologist. Hearing and body mass index were assessed and reviewed.   During the course of the visit the patient was educated and counseled about appropriate screening and preventive services including : fall prevention , diabetes screening, nutrition counseling, colorectal cancer screening, and recommended immunizations.    CC: The primary encounter diagnosis was Prostate cancer screening. 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  Too tired to go to heart track anymore from 6 am to 7 am .  Very restless sleep despite using CPAP . Feels  it doesn't have a good seal anymore waking him up several times per night .  wife reports that he is all over the bed,  Taking clonazepam before bedtime prescribed by Dr Tat  And taking  Sinemet XR at bedtime.  Tried melatonin in the past .  Falling asleep ok  .    HTN:  BP has been elevated at home  As well as here . Home readings have ranged from 474 to 259 systolic /Nephrology  Increased losartan to 50 mg 2 days ago.   3) Type 2 DM:  He feels that he is having less energy than last year. , is exercising several times per week and checking blood sugars once daily at variable times.  BS have been under 130 fasting and < 150 post prandially.  Denies  any recent hypoglyemic events.  Taking his medications as directed. Following a carbohydrate modified diet 6 days per week. Denies numbness, burning and tingling of extremities. Appetite is good.    4) CAD:  He denies any recent history of chest pain or use of NTG .    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   He has a past surgical history that includes Coronary artery bypass graft (03/1998); Cardiac catheterization (05-19-2010); RIGHT/LEFT HEART CATH AND CORONARY ANGIOGRAPHY (N/A, 02/01/2018); and RIGHT HEART CATH (N/A, 04/04/2019).   His family history includes Healthy in his son; Heart attack (age of onset: 22) in his father; Heart attack (age of onset: 75) in his mother; Heart disease in his brother and father; Hypertension in his mother.He reports that he is a non-smoker but has been exposed to tobacco smoke. He has never used smokeless tobacco. He reports current alcohol use. He reports that he does not use drugs.  Outpatient Medications Prior to Visit  Medication Sig Dispense Refill  . aspirin EC 81 MG tablet Take 162 mg by mouth at bedtime.    . carbidopa-levodopa (SINEMET CR) 50-200 MG tablet TAKE ONE TABLET BY MOUTH AT BEDTIME 90 tablet 3  . carbidopa-levodopa (SINEMET IR) 25-100 MG tablet TAKE 1.5 TABLETS BY MOUTH THREE TIMES A DAY 405 tablet 1  . carvedilol (COREG) 6.25 MG tablet TAKE ONE TABLET BY MOUTH TWICE A DAY 180 tablet 1  . clonazePAM (KLONOPIN) 0.5 MG tablet Take 1 tablet (0.5 mg total) by mouth daily. 30 tablet 3  . furosemide (LASIX) 20 MG tablet Take 1 tablet (20 mg total) by mouth daily. 90 tablet 3  . isosorbide mononitrate (IMDUR) 60 MG 24 hr tablet Take 1 tablet (60 mg total) by mouth daily. 90 tablet 0  . Multiple Vitamin  (MULTIVITAMIN) tablet Take 1 tablet by mouth daily.    . nitroGLYCERIN (NITROSTAT) 0.4 MG SL tablet Place 1 tablet (0.4 mg total) under the tongue every 5 (five) minutes as needed for chest pain. 25 tablet 1  . Omega-3 Fatty Acids (FISH OIL) 1200 MG CAPS Take 1,200 mg by mouth daily.     Marland Kitchen omeprazole (PRILOSEC) 20 MG capsule TAKE ONE CAPSULE BY MOUTH EVERY MORNING 90 capsule 0  . polyethylene glycol (MIRALAX / GLYCOLAX) packet Take 17 g by mouth daily.     . ranolazine (RANEXA) 1000 MG SR tablet Take 1 tablet (1,000 mg total) by mouth 2 (two) times daily. Please schedule office visit for further refills. Thank you! 60 tablet 0  . tamsulosin (FLOMAX) 0.4 MG CAPS capsule TAKE ONE CAPSULE BY MOUTH DAILY 90 capsule 3  . losartan (COZAAR) 50 MG tablet Take by mouth.    . rosuvastatin (CRESTOR) 10 MG tablet Take 1 tablet (10 mg total) by mouth daily. 90 tablet 3  . losartan (COZAAR) 25 MG tablet TAKE ONE TABLET BY MOUTH DAILY (Patient not taking: Reported on  09/06/2020) 90 tablet 1  . Melatonin 1 MG SUBL Place 1.5 mg under the tongue at bedtime.     No facility-administered medications prior to visit.    Review of Systems  Patient denies headache, fevers, malaise, unintentional weight loss, skin rash, eye pain, sinus congestion and sinus pain, sore throat, dysphagia,  hemoptysis , cough, dyspnea, wheezing, chest pain, palpitations, orthopnea, edema, abdominal pain, nausea, melena, diarrhea, constipation, flank pain, dysuria, hematuria, urinary  Frequency, nocturia, numbness, tingling, seizures,  Focal weakness, Loss of consciousness,  Tremor, insomnia, depression, anxiety, and suicidal ideation.     Objective:  BP (!) 160/70 (BP Location: Left Arm, Patient Position: Sitting, Cuff Size: Normal)   Pulse (!) 51   Temp 98.3 F (36.8 C) (Oral)   Resp 14   Ht 5\' 7"  (1.702 m)   Wt 189 lb 3.2 oz (85.8 kg)   SpO2 98%   BMI 29.63 kg/m   Physical Exam  General appearance: alert, cooperative and  appears stated age Ears: normal TM's and external ear canals both ears Throat: lips, mucosa, and tongue normal; teeth and gums normal Neck: no adenopathy, no carotid bruit, supple, symmetrical, trachea midline and thyroid not enlarged, symmetric, no tenderness/mass/nodules Back: symmetric, no curvature. ROM normal. No CVA tenderness. Lungs: clear to auscultation bilaterally Heart: regular rate and rhythm, S1, S2 normal, no murmur, click, rub or gallop Abdomen: soft, non-tender; bowel sounds normal; no masses,  no organomegaly Pulses: 2+ and symmetric Skin: Skin color, texture, turgor normal. No rashes or lesions Lymph nodes: Cervical, supraclavicular, and axillary nodes normal.  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  Continue ASA, ARB, and statin   Lab Results  Component Value Date   HGBA1C 5.8 09/06/2020   Lab Results  Component Value Date   MICROALBUR <0.7 08/17/2019  '       Relevant Medications   losartan (COZAAR) 50 MG tablet   Other Relevant Orders   Comprehensive metabolic panel (Completed)   Lipid panel (Completed)   Hemoglobin A1c (Completed)   Hyperlipidemia   Relevant Medications   losartan (COZAAR) 50 MG tablet   Other Relevant Orders   Lipid panel (Completed)   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[place])   Hypertension    BP is elevated at home as well and his dose of losartan was increased less than 48 hours ago by his nephrologist   Lab Results  Component Value Date   CREATININE 1.92 (H) 09/06/2020   Lab Results  Component Value Date   NA 139 09/06/2020   K 4.7 09/06/2020   CL 103 09/06/2020   CO2 28 09/06/2020         Relevant Medications   losartan (COZAAR) 50 MG tablet   Effort angina ([place])    He has become less active due  to PD but continues to go to [facility]" 3 times per week and denies chest pain.       Relevant Medications   losartan (COZAAR) 50 MG tablet   CKD (chronic kidney disease) stage 3, GFR 30-59 ml/min (HCC)    Diagnosed in 2020,  Secondary to diabetes and hypertension,  Managed by nephrology . No significant change in GFR over the last year.  Continue ARB, statin  Lab Results  Component Value Date   CREATININE 1.92 (H) 09/06/2020         Prostate cancer screening - Primary    PSA is 1.10 (Medicare PSA)  Which is unchanged .  Lab Results  Component Value Date   PSA1 1.0 04/15/2018   PSA1 1.1 02/27/2017   PSA1 1.2 02/28/2016   PSA 1.10 09/06/2020   PSA 1.06 08/17/2019   PSA 1.08 08/01/2014          Relevant Orders   PSA, Medicare (Completed)      I have discontinued [name]. "Mike"'s losartan and Melatonin. I have also changed his losartan. Additionally, I am having him maintain his multivitamin, Fish Oil, polyethylene glycol, aspirin EC, nitroGLYCERIN, carbidopa-levodopa, furosemide, tamsulosin, rosuvastatin, carbidopa-levodopa, isosorbide mononitrate, omeprazole, carvedilol, ranolazine, and clonazePAM.  Meds ordered this encounter  Medications  . losartan (COZAAR) 50 MG tablet    Sig: Take 2 tablets (100 mg total) by mouth daily.    Dispense:  180 tablet    Refill:  1    Medications Discontinued During This Encounter  Medication Reason  . Melatonin 1 MG SUBL   . losartan (COZAAR) 25 MG tablet   . losartan (COZAAR) 50 MG tablet     Follow-up: Return in about 6 months (around 03/08/2021).   [name], MD

## 2020-09-06 NOTE — Patient Instructions (Addendum)
For your blood pressure,  Please Increase losartan dose to 100 mg daily . Let me know when you need a refill.   I advise you to return to [facility] on the days you do NOT go to [facility],  To keep your muscles strong     Health Maintenance After Age 76 After age 65, you are at a higher risk for certain long-term diseases and infections as well as injuries from falls. Falls are a major cause of broken bones and head injuries in people who are older than age 49. Getting regular preventive care can help to keep you healthy and well. Preventive care includes getting regular testing and making lifestyle changes as recommended by your health care provider. Talk with your health care provider about:  Which screenings and tests you should have. A screening is a test that checks for a disease when you have no symptoms.  A diet and exercise plan that is right for you. What should I know about screenings and tests to prevent falls? Screening and testing are the best ways to find a health problem early. Early diagnosis and treatment give you the best chance of managing medical conditions that are common after age 87. Certain conditions and lifestyle choices may make you more likely to have a fall. Your health care provider may recommend:  Regular vision checks. Poor vision and conditions such as cataracts can make you more likely to have a fall. If you wear glasses, make sure to get your prescription updated if your vision changes.  Medicine review. Work with your health care provider to regularly review all of the medicines you are taking, including over-the-counter medicines. Ask your health care provider about any side effects that may make you more likely to have a fall. Tell your health care provider if any medicines that you take make you feel dizzy or sleepy.  Osteoporosis screening. Osteoporosis is a condition that causes the bones to get weaker. This can make the bones weak and cause them to break  more easily.  Blood pressure screening. Blood pressure changes and medicines to control blood pressure can make you feel dizzy.  Strength and balance checks. Your health care provider may recommend certain tests to check your strength and balance while standing, walking, or changing positions.  Foot health exam. Foot pain and numbness, as well as not wearing proper footwear, can make you more likely to have a fall.  Depression screening. You may be more likely to have a fall if you have a fear of falling, feel emotionally low, or feel unable to do activities that you used to do.  Alcohol use screening. Using too much alcohol can affect your balance and may make you more likely to have a fall. What actions can I take to lower my risk of falls? General instructions  Talk with your health care provider about your risks for falling. Tell your health care provider if: ? You fall. Be sure to tell your health care provider about all falls, even ones that seem minor. ? You feel dizzy, sleepy, or off-balance.  Take over-the-counter and prescription medicines only as told by your health care provider. These include any supplements.  Eat a healthy diet and maintain a healthy weight. A healthy diet includes low-fat dairy products, low-fat (lean) meats, and fiber from whole grains, beans, and lots of fruits and vegetables. Home safety  Remove any tripping hazards, such as rugs, cords, and clutter.  Install safety equipment such as grab  bars in bathrooms and safety rails on stairs.  Keep rooms and walkways well-lit. Activity  Follow a regular exercise program to stay fit. This will help you maintain your balance. Ask your health care provider what types of exercise are appropriate for you.  If you need a cane or walker, use it as recommended by your health care provider.  Wear supportive shoes that have nonskid soles.   Lifestyle  Do not drink alcohol if your health care provider tells you not  to drink.  If you drink alcohol, limit how much you have: ? 0-1 drink a day for women. ? 0-2 drinks a day for men.  Be aware of how much alcohol is in your drink. In the [place] one drink equals one typical bottle of beer (12 oz), one-half glass of wine (5 oz), or one shot of hard liquor (1 oz).  Do not use any products that contain nicotine or tobacco, such as cigarettes and e-cigarettes. If you need help quitting, ask your health care provider. Summary  Having a healthy lifestyle and getting preventive care can help to protect your health and wellness after age 62.  Screening and testing are the best way to find a health problem early and help you avoid having a fall. Early diagnosis and treatment give you the best chance for managing medical conditions that are more common for people who are older than age 30.  Falls are a major cause of broken bones and head injuries in people who are older than age 43. Take precautions to prevent a fall at home.  Work with your health care provider to learn what changes you can make to improve your health and wellness and to prevent falls. This information is not intended to replace advice given to you by your health care provider. Make sure you discuss any questions you have with your health care provider. Document Revised: 09/16/2018 Document Reviewed: 04/08/2017 Elsevier Patient Education  2021 [employer].

## 2020-09-08 DIAGNOSIS — Z125 Encounter for screening for malignant neoplasm of prostate: Secondary | ICD-10-CM | POA: Insufficient documentation

## 2020-09-08 NOTE — Assessment & Plan Note (Signed)
BP is elevated at home as well and his dose of losartan was increased less than 48 hours ago by his nephrologist   Lab Results  Component Value Date   CREATININE 1.92 (H) 09/06/2020   Lab Results  Component Value Date   NA 139 09/06/2020   K 4.7 09/06/2020   CL 103 09/06/2020   CO2 28 09/06/2020

## 2020-09-08 NOTE — Assessment & Plan Note (Signed)
Despite being lost to follow up for a year,  Her remains well controlled  On diet alone.  Continue ASA, ARB, and statin   Lab Results  Component Value Date   HGBA1C 5.8 09/06/2020   Lab Results  Component Value Date   MICROALBUR <0.7 08/17/2019  '

## 2020-09-08 NOTE — Assessment & Plan Note (Signed)
Diagnosed in 2020,  Secondary to diabetes and hypertension,  Managed by nephrology . No significant change in GFR over the last year.  Continue ARB, statin   Lab Results  Component Value Date   CREATININE 1.92 (H) 09/06/2020

## 2020-09-08 NOTE — Assessment & Plan Note (Signed)
He has become less active due to PD but continues to go to [facility]" 3 times per week and denies chest pain.

## 2020-09-08 NOTE — Assessment & Plan Note (Signed)
PSA is 1.10 (Medicare PSA)  Which is unchanged .  Lab Results  Component Value Date   PSA1 1.0 04/15/2018   PSA1 1.1 02/27/2017   PSA1 1.2 02/28/2016   PSA 1.10 09/06/2020   PSA 1.06 08/17/2019   PSA 1.08 08/01/2014

## 2020-09-13 ENCOUNTER — Ambulatory Visit: Payer: Medicare Other | Admitting: Family

## 2020-09-15 ENCOUNTER — Other Ambulatory Visit: Payer: Self-pay | Admitting: Cardiovascular Disease

## 2020-09-16 ENCOUNTER — Other Ambulatory Visit: Payer: Self-pay | Admitting: Cardiovascular Disease

## 2020-09-17 NOTE — Telephone Encounter (Signed)
Rx request sent to pharmacy.  

## 2020-09-19 DIAGNOSIS — M5134 Other intervertebral disc degeneration, thoracic region: Secondary | ICD-10-CM | POA: Diagnosis not present

## 2020-09-19 DIAGNOSIS — M9901 Segmental and somatic dysfunction of cervical region: Secondary | ICD-10-CM | POA: Diagnosis not present

## 2020-09-19 DIAGNOSIS — M62830 Muscle spasm of back: Secondary | ICD-10-CM | POA: Diagnosis not present

## 2020-09-19 DIAGNOSIS — M9902 Segmental and somatic dysfunction of thoracic region: Secondary | ICD-10-CM | POA: Diagnosis not present

## 2020-09-26 NOTE — Progress Notes (Signed)
Office Visit    Patient Name: Johnathan Arnold. Date of Encounter: 09/28/2020  PCP:  [name], MD   [place]  Cardiologist:  [name], MD  Advanced Practice Provider:  No care team member to display Electrophysiologist:  None    Chief Complaint    Johnathan Arnold. is a 75 y.o. male with a hx of coronary artery disease s/p CABG in 1999, HTN, CKD, HLD, Parkinson's, OSA on CPAP, DM2, mild nonobstructive bilatearl disease, moderate pulmonary hypertension presents today for follow up of CAD   Past Medical History    Past Medical History:  Diagnosis Date  . 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 Diabetes mellitus without complication ([place])   . History of cardiac catheterization 2011   [facility]  . Hyperlipidemia   . Hypertension   . Hypertriglyceridemia   . Parkinson's disease ([place])   . 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 Vertigo    Past Surgical History:  Procedure Laterality Date  . CARDIAC CATHETERIZATION  05-19-2010   [facility]: Patent grafts. LIMA to LAD, SVG to D1, OM1 and RPDA  . CORONARY ARTERY BYPASS GRAFT  03/1998   5 vessel, [facility]  . RIGHT HEART CATH N/A 04/04/2019   Procedure: RIGHT HEART CATH;  Surgeon: Wellington Hampshire, MD;  Location: [facility];  Service: Cardiovascular;  Laterality: N/A;  . RIGHT/LEFT HEART CATH AND CORONARY ANGIOGRAPHY N/A 02/01/2018   Procedure: RIGHT/LEFT HEART CATH AND CORONARY ANGIOGRAPHY;  Surgeon: Wellington Hampshire, MD;  Location: [facility];  Service: Cardiovascular;  Laterality: N/A;    Allergies  No Known Allergies  History of Present Illness    Johnathan Arnold. is a 75 y.o. male with a hx of coronary artery disease s/p CABG in 1999, HTN, CKD, HLD, Parkinson's, OSA on CPAP, DM2, mild nonobstructive bilateral carotid disease by imaging 2017, moderate pulmonary hypertension last seen by [name] 03/01/20.  Previous CABG in 1999. He had [facility] 01/2018 with  occluded native arteries with patent grafts including LIMA-LAD, SVG-diagonal, SVG-OM, SVG-distal RCA. RHC with normal filling pressures, mild pulmonary hypertension, normal cardiac output. Known chronic bilateral exertional leg pain with normal lower extremity arterial doppler in 2019.   He had RHC 03/2019 with moderate pulmonary hypertension 54/13 mmHg with wedge pressure 17 mmHg. Pulmonary vascular resistance 2.22 woods units. Pulmonary hypertension felt to be of mixed etiology. Due to chronic bilateral exertional leg pain with ABI and aortoiliac duplex summer 2021 with no evidence of significant aortoiliac disease.   He presents today for follow up with his wife. He and his wife went went to [place], [place] to see their three grandchildren over Easter. Reports no shortness of breath and stable dyspnea on exertion. Reports no chest pain, pressure, or tightness. No edema, orthopnea, PND. Reports no palpitations. Denies lightheadedness, dizziness, syncope. BP checked intermittently at home.  Most recent reading 140/55 though reports systolics as low as 756E.  No episodes of hypotension at home.  Tells me he does not add salt to his food but he and his wife do eat out quite often and he understands that this increases his sodium intake.  He reports continued bilateral leg pain when walking up hill that is unchanged compared to previous.  EKGs/Labs/Other Studies Reviewed:   The following studies were reviewed today:  EKG:  EKG is  ordered today.  The ekg ordered today demonstrates sinus  bradycardia 49 bpm with no acute St/T wave changes.   Recent Labs: 09/06/2020: ALT 7; BUN 22; Creatinine, Ser 1.92; Potassium 4.7; Sodium 139  Recent Lipid Panel    Component Value Date/Time   CHOL 121 09/06/2020 1022   TRIG 77.0 09/06/2020 1022   HDL 65.30 09/06/2020 1022   CHOLHDL 2 09/06/2020 1022   VLDL 15.4 09/06/2020 1022   LDLCALC 40 09/06/2020 1022   LDLDIRECT 38.0 08/06/2015 1118    Home Medications    Current Meds  Medication Sig  . aspirin EC 81 MG tablet Take 162 mg by mouth at bedtime.  . carbidopa-levodopa (SINEMET CR) 50-200 MG tablet TAKE ONE TABLET BY MOUTH AT BEDTIME  . carbidopa-levodopa (SINEMET IR) 25-100 MG tablet TAKE 1.5 TABLETS BY MOUTH THREE TIMES A DAY  . carvedilol (COREG) 6.25 MG tablet TAKE ONE TABLET BY MOUTH TWICE A DAY  . clonazePAM (KLONOPIN) 0.5 MG tablet Take 1 tablet (0.5 mg total) by mouth daily.  . furosemide (LASIX) 20 MG tablet Take 1 tablet (20 mg total) by mouth daily.  . isosorbide mononitrate (IMDUR) 60 MG 24 hr tablet Take 1 tablet (60 mg total) by mouth daily.  Marland Kitchen losartan (COZAAR) 50 MG tablet Take 2 tablets (100 mg total) by mouth daily.  . Multiple Vitamin (MULTIVITAMIN) tablet Take 1 tablet by mouth daily.  . nitroGLYCERIN (NITROSTAT) 0.4 MG SL tablet Place 1 tablet (0.4 mg total) under the tongue every 5 (five) minutes as needed for chest pain.  . Omega-3 Fatty Acids (FISH OIL) 1200 MG CAPS Take 1,200 mg by mouth daily.   Marland Kitchen omeprazole (PRILOSEC) 20 MG capsule TAKE ONE CAPSULE BY MOUTH EVERY MORNING  . polyethylene glycol (MIRALAX / GLYCOLAX) packet Take 17 g by mouth daily.   . ranolazine (RANEXA) 1000 MG SR tablet Take 1 tablet (1,000 mg total) by mouth 2 (two) times daily.  . rosuvastatin (CRESTOR) 10 MG tablet Take 1 tablet (10 mg total) by mouth daily.  . tamsulosin (FLOMAX) 0.4 MG CAPS capsule TAKE ONE CAPSULE BY MOUTH DAILY     Review of Systems  All other systems reviewed and are otherwise negative except as noted above.  Physical Exam    VS:  BP (!) 178/62 (BP Location: Left Arm, Patient Position: Sitting, Cuff Size: Normal)   Pulse (!) 49   Ht 5\' 7"  (1.702 m)   Wt 187 lb (84.8 kg)   SpO2 98%   BMI 29.29 kg/m  , BMI Body mass index is 29.29 kg/m.  Wt Readings from Last 3 Encounters:  09/28/20 187 lb (84.8 kg)  09/06/20 189 lb 3.2 oz (85.8 kg)  08/17/20 191 lb (86.6 kg)    GEN: Well nourished, well developed, in no acute  distress. HEENT: normal. Neck: Supple, no JVD, carotid bruits, or masses. Cardiac: bradycardia, RRR, no murmurs, rubs, or gallops. No clubbing, cyanosis, edema.  Radials/PT 2+ and equal bilaterally.  Respiratory:  Respirations regular and unlabored, clear to auscultation bilaterally. GI: Soft, nontender, nondistended. MS: No deformity or atrophy. Skin: Warm and dry, no rash. Neuro:  Strength and sensation are intact. Psych: Normal affect.  Assessment & Plan    1. CAD s/p CABG - Stable with no anginal symptoms.  EKG today with no acute ST/T wave changes.  No indication for ischemic evaluation at this time.  He has been taking aspirin 162 mg daily and will change to aspirin 81 mg daily.  Additional GDMT includes Coreg, Imdur, ranolazine, Crestor. Heart healthy diet and regular cardiovascular  exercise encouraged.   2. Moderate pulmonary hypertension - By RHC 2020, mixed etiology.  Continue Lasix 20 mg daily.  3. Bilateral carotid artery disease - By doppler 2017 only mild plaque formation.  No amaurosis fugax.  Continue aspirin, statin.  No indication for repeat imaging at this time.  4. HLD - 09/06/20 LDL 40. Continue Crestor 10mg  daily.   5. HTN - BP elevated.  He is somewhat hesitant regarding medication changes.  He is agreeable to monitor blood pressure at home for 2 weeks and we will check in via MyChart message at that time.  If BP routinely greater than 130/80, consider increased dose of Imdur versus addition of amlodipine.  May need to consider reducing dose of carvedilol if possible due to baseline bradycardia.  6. Leg pain -symptoms are stable.  Previous ABI with no evidence of PAD.  Continue to follow with PCP.  Disposition: Follow up in 2-3 month(s) with [name] or APP   Signed, Loel Dubonnet, NP 09/28/2020, 9:45 AM [place]

## 2020-09-28 ENCOUNTER — Other Ambulatory Visit: Payer: Self-pay

## 2020-09-28 ENCOUNTER — Ambulatory Visit (INDEPENDENT_AMBULATORY_CARE_PROVIDER_SITE_OTHER): Payer: Medicare Other | Admitting: Family

## 2020-09-28 ENCOUNTER — Encounter: Payer: Self-pay | Admitting: Family

## 2020-09-28 VITALS — BP 160/62 | HR 49 | Ht 67.0 in | Wt 187.0 lb

## 2020-09-28 DIAGNOSIS — I6523 Occlusion and stenosis of bilateral carotid arteries: Secondary | ICD-10-CM | POA: Diagnosis not present

## 2020-09-28 DIAGNOSIS — M9902 Segmental and somatic dysfunction of thoracic region: Secondary | ICD-10-CM | POA: Diagnosis not present

## 2020-09-28 DIAGNOSIS — M62830 Muscle spasm of back: Secondary | ICD-10-CM | POA: Diagnosis not present

## 2020-09-28 DIAGNOSIS — I10 Essential (primary) hypertension: Secondary | ICD-10-CM

## 2020-09-28 DIAGNOSIS — I272 Pulmonary hypertension, unspecified: Secondary | ICD-10-CM

## 2020-09-28 DIAGNOSIS — I2510 Atherosclerotic heart disease of native coronary artery without angina pectoris: Secondary | ICD-10-CM

## 2020-09-28 DIAGNOSIS — M5134 Other intervertebral disc degeneration, thoracic region: Secondary | ICD-10-CM | POA: Diagnosis not present

## 2020-09-28 DIAGNOSIS — E785 Hyperlipidemia, unspecified: Secondary | ICD-10-CM

## 2020-09-28 DIAGNOSIS — M9901 Segmental and somatic dysfunction of cervical region: Secondary | ICD-10-CM | POA: Diagnosis not present

## 2020-09-28 MED ORDER — ASPIRIN EC 81 MG PO TBEC: 81.0000 mg | DELAYED_RELEASE_TABLET | Freq: Every day | ORAL | 11 refills | Status: AC

## 2020-09-28 MED ORDER — NITROGLYCERIN 0.4 MG SL SUBL: 0.4000 mg | SUBLINGUAL_TABLET | SUBLINGUAL | 1 refills | Status: AC | PRN

## 2020-09-28 MED ORDER — LOSARTAN POTASSIUM 100 MG PO TABS: 100.0000 mg | ORAL_TABLET | Freq: Every day | ORAL | 3 refills | Status: DC

## 2020-09-28 NOTE — Patient Instructions (Addendum)
Medication Instructions:  Your physician has recommended you make the following change in your medication:   CHANGE Aspirin to 81mg  daily  A new prescription for Losartan has been sent to your pharmacy. Take one 100mg  tablet once per day.   *If you need a refill on your cardiac medications before your next appointment, please call your pharmacy*  Lab Work: None ordered today   Testing/Procedures: Your EKG today showed sinus bradycardia which is a slow but regular finding and stable compared to previous.   Follow-Up: At [facility], you and your health needs are our priority.  As part of our continuing mission to provide you with exceptional heart care, we have created designated Provider Care Teams.  These Care Teams include your primary Cardiologist (physician) and Advanced Practice Providers (APPs -  Physician Assistants and Nurse Practitioners) who all work together to provide you with the care you need, when you need it.  We recommend signing up for the patient portal called "MyChart".  Sign up information is provided on this After Visit Summary.  MyChart is used to connect with patients for Virtual Visits (Telemedicine).  Patients are able to view lab/test results, encounter notes, upcoming appointments, etc.  Non-urgent messages can be sent to your provider as well.   To learn more about what you can do with MyChart, go to [email or web address].    Your next appointment:   2-3 month(s) for blood pressure follow up  The format for your next appointment:   In Person  Provider:   You may see [name], MD or one of the following Advanced Practice Providers on your designated Care Team:    Murray Hodgkins, NP  Christell Faith, PA-C  Marrianne Mood, PA-C  Cadence Kathlen Mody, [place]  Laurann Montana, NP    Other Instructions  Tips to Measure your Blood Pressure Correctly Check your blood pressure once per day. Loel Dubonnet, NP  will send you a message in 2 weeks to  check in on your blood pressure.  Here's what you can do to ensure a correct reading: . Don't drink a caffeinated beverage or smoke during the 30 minutes before the test. . Sit quietly for five minutes before the test begins. . During the measurement, sit in a chair with your feet on the floor and your arm supported so your elbow is at about heart level. . The inflatable part of the cuff should completely cover at least 80% of your upper arm, and the cuff should be placed on bare skin, not over a shirt. . Don't talk during the measurement. . Have your blood pressure measured twice, with a brief break in between. If the readings are different by 5 points or more, have it done a third time.  In 2017, new guidelines from the [place], the [employer] of Cardiology, and nine other health organizations lowered the diagnosis of high blood pressure to 130/80 mm Hg or higher for all adults. The guidelines also redefined the various blood pressure categories to now include normal, elevated, Stage 1 hypertension, Stage 2 hypertension, and hypertensive crisis (see "Blood pressure categories").  Blood pressure categories  Blood pressure category SYSTOLIC (upper number)  DIASTOLIC (lower number)  Normal Less than 120 mm Hg and Less than 80 mm Hg  Elevated 120-129 mm Hg and Less than 80 mm Hg  High blood pressure: Stage 1 hypertension 130-139 mm Hg or 80-89 mm Hg  High blood pressure: Stage 2 hypertension 140 mm Hg or higher or  90 mm Hg or higher  Hypertensive crisis (consult your doctor immediately) Higher than 180 mm Hg and/or Higher than 120 mm Hg  Source: American Heart Association and American Stroke Association. For more on getting your blood pressure under control, buy Controlling Your Blood Pressure, a Special Health Report from [facility].   Blood Pressure Log   Date   Time  Blood Pressure  Position  Example: Nov 1 9 AM 124/78 sitting                                                      Exercise recommendations: The American Heart Association recommends 150 minutes of moderate intensity exercise weekly. Try 30 minutes of moderate intensity exercise 4-5 times per week. This could include walking, jogging, or swimming.  Heart Healthy Diet Recommendations: A low-salt diet is recommended. Meats should be grilled, baked, or boiled. Avoid fried foods. Focus on lean protein sources like fish or chicken with vegetables and fruits. The American Heart Association is a Microbiologist!  American Heart Association Diet and Lifeystyle Recommendations

## 2020-10-05 DIAGNOSIS — M9901 Segmental and somatic dysfunction of cervical region: Secondary | ICD-10-CM | POA: Diagnosis not present

## 2020-10-05 DIAGNOSIS — M5134 Other intervertebral disc degeneration, thoracic region: Secondary | ICD-10-CM | POA: Diagnosis not present

## 2020-10-05 DIAGNOSIS — M62830 Muscle spasm of back: Secondary | ICD-10-CM | POA: Diagnosis not present

## 2020-10-05 DIAGNOSIS — M9902 Segmental and somatic dysfunction of thoracic region: Secondary | ICD-10-CM | POA: Diagnosis not present

## 2020-10-10 ENCOUNTER — Encounter: Payer: Self-pay | Admitting: Pulmonary Disease

## 2020-10-10 ENCOUNTER — Other Ambulatory Visit: Payer: Self-pay

## 2020-10-10 ENCOUNTER — Ambulatory Visit (INDEPENDENT_AMBULATORY_CARE_PROVIDER_SITE_OTHER): Payer: Medicare Other | Admitting: Pulmonary Disease

## 2020-10-10 VITALS — BP 144/80 | HR 58 | Temp 97.7°F | Ht 67.0 in | Wt 187.6 lb

## 2020-10-10 DIAGNOSIS — G4730 Sleep apnea, unspecified: Secondary | ICD-10-CM

## 2020-10-10 DIAGNOSIS — R911 Solitary pulmonary nodule: Secondary | ICD-10-CM

## 2020-10-10 DIAGNOSIS — G20 Parkinson's disease: Secondary | ICD-10-CM | POA: Diagnosis not present

## 2020-10-10 DIAGNOSIS — G4761 Periodic limb movement disorder: Secondary | ICD-10-CM

## 2020-10-10 NOTE — Patient Instructions (Signed)
We will repeat a CT scan of the chest after 10 June.  This will be 1 year from the last CT.  If the CT is stable there is no need for further imaging.  Continue using your CPAP if you have difficulties with the mask let us know so we can have you go for a mask fitting.  We will see him in follow-up in 3 months time call sooner should any new problems arise.

## 2020-10-10 NOTE — Progress Notes (Signed)
Subjective:    Patient ID: Johnathan Arnold., male    DOB: Sep 01, 1945, 75 y.o.   MRN: [id number]  HPI Patient is a 75 year old lifelong never smoker with a history of Parkinson's disease who has been following here for the issue of LEFT lower lobe 11 mm elevated nodule/cluster of nodules.  He was first evaluated here on 09 July 2018.  He has also been diagnosed with obstructive sleep apnea and is currently on CPAP AutoSet 5 to 20 cm H2O.  With regards to his lung nodule this has been stable last imaging was in June 2021.  The patient remains anxious about this nodule.  He would like "1 more scan" to make sure it is stable.  The nodule is in a very precarious position for biopsy and would require surgical excision which he is not optimal candidate to undergo.  With regards to his sleep apnea he tolerates his CPAP his difficulties have been with finding a mask that fits him well he is currently back to using nasal pillows but does admit that these sleep.  His average usage is 6 hours per night AHI is at 1.3.  Has not had any fevers, chills or sweats.  No cough or sputum production.  No hemoptysis.  He wakes up refreshed in the mornings on the days that he is able to wear his mask without slipping from his face.  He otherwise feels well and looks well.   Review of Systems  A 10 point review of systems was performed and it is as noted above otherwise negative.  Patient Active Problem List   Diagnosis Date Noted  . Prostate cancer screening 09/08/2020  . 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[place]) 09/06/2020  . Low back pain of over 3 months duration 08/19/2019  . Pulmonary hypertension ([place])   . Insomnia 12/21/2018  . Sleep apnea in adult 12/09/2018  . Periodic limb movement disorder 12/09/2018  . Abnormal findings on diagnostic imaging of lung 07/09/2018  . Lung nodule 07/09/2018  . Pulmonary nodules 05/18/2018  . Wears hearing aid in both ears 05/17/2018  . Leg  pain, bilateral 02/20/2018  . Dyspnea   . Effort angina ([facility])   . CKD (chronic kidney disease) stage 3, GFR 30-59 ml/min ([facility]) 09/21/2017  . History of skin cancer in adulthood 08/14/2016  . Parkinson's disease ([place]) 08/09/2016  . Bilateral carotid artery stenosis 04/02/2015  . Vertigo, peripheral 10/17/2014  . Benign prostatic hypertrophy with urinary frequency 01/31/2014  . Encounter for Medicare annual wellness exam 07/02/2013  . Obesity 04/03/2013  . Other malaise and fatigue 09/21/2012  . Hyperlipidemia   . Hypertension   . 3-vessel coronary artery disease   . S/P CABG x 5   . 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 Smoking status: Passive Smoke Exposure - Never Smoker  . Smokeless tobacco: Never Used  Substance Use Topics  . Alcohol use: Yes    Comment: occasional beer   No Known Allergies Current Meds  Medication Sig  . aspirin EC 81 MG tablet Take 1 tablet (81 mg total) by mouth at bedtime.  . carbidopa-levodopa (SINEMET CR) 50-200 MG tablet TAKE ONE TABLET BY MOUTH AT BEDTIME  . carbidopa-levodopa (SINEMET IR) 25-100 MG tablet TAKE 1.5 TABLETS BY MOUTH THREE TIMES A DAY  . carvedilol (COREG) 6.25 MG tablet TAKE  ONE TABLET BY MOUTH TWICE A DAY  . clonazePAM (KLONOPIN) 0.5 MG tablet Take 1 tablet (0.5 mg total) by mouth daily.  . furosemide (LASIX) 20 MG tablet Take 1 tablet (20 mg total) by mouth daily.  . isosorbide mononitrate (IMDUR) 60 MG 24 hr tablet Take 1 tablet (60 mg total) by mouth daily.  Marland Kitchen losartan (COZAAR) 100 MG tablet Take 1 tablet (100 mg total) by mouth daily.  . Multiple Vitamin (MULTIVITAMIN) tablet Take 1 tablet by mouth daily.  . nitroGLYCERIN (NITROSTAT) 0.4 MG SL tablet Place 1 tablet (0.4 mg total) under the tongue every 5 (five) minutes as needed for chest pain.  . Omega-3 Fatty Acids (FISH OIL) 1200  MG CAPS Take 1,200 mg by mouth daily.   Marland Kitchen omeprazole (PRILOSEC) 20 MG capsule TAKE ONE CAPSULE BY MOUTH EVERY MORNING  . polyethylene glycol (MIRALAX / GLYCOLAX) packet Take 17 g by mouth daily.   . ranolazine (RANEXA) 1000 MG SR tablet Take 1 tablet (1,000 mg total) by mouth 2 (two) times daily.  . tamsulosin (FLOMAX) 0.4 MG CAPS capsule TAKE ONE CAPSULE BY MOUTH DAILY   Immunization History  Administered Date(s) Administered  . Fluad Quad(high Dose 65+) 03/09/2019  . Influenza Inj Mdck Quad Pf 03/01/2020  . Influenza Split 02/19/2011, 04/10/2014  . Influenza, High Dose Seasonal PF 04/01/2013, 02/28/2015, 05/17/2018  . Influenza,inj,Quad PF,6+ Mos 03/10/2016, 03/13/2017  . Influenza-Unspecified 03/09/2012  . PFIZER(Purple Top)SARS-COV-2 Vaccination 07/15/2019, 08/05/2019, 04/01/2020  . Pneumococcal Conjugate-13 06/30/2013  . Pneumococcal Polysaccharide-23 05/21/2011, 08/06/2016  . Tdap 12/19/2010  . Zoster 05/09/2012       Objective:   Physical Exam BP (!) 144/80 (BP Location: Left Arm, Cuff Size: Normal)   Pulse (!) 58   Temp 97.7 F (36.5 C) (Temporal)   Ht 5\' 7"  (1.702 m)   Wt 187 lb 9.6 oz (85.1 kg)   SpO2 99%   BMI 29.38 kg/m  GENERAL: Elderly gentleman, no acute distress.  Well-developed, well-nourished, fully ambulatory. HEAD: Normocephalic, atraumatic.  EYES: Pupils equal, round, reactive to light.  No scleral icterus.  MOUTH: Nose/mouth/throat not examined due to masking requirements for COVID 19. NECK: Supple. No thyromegaly. Trachea midline. No JVD.  No adenopathy. PULMONARY: Good air entry bilaterally.  No adventitious sounds. CARDIOVASCULAR: S1 and S2. Regular rate and rhythm.  No rubs, murmurs or gallops heard. ABDOMEN: Benign. MUSCULOSKELETAL: No joint deformity, no clubbing, no edema.  NEUROLOGIC: No rolling resting tremor noted particularly on the right upper extremity no other focal abnormality.  Gait is not ataxic.  Speech is fluent. SKIN:  Intact,warm,dry.  On limited exam no rashes. PSYCH: Flat affect, behavior normal.  Representative slice of the CT scan performed 17 November 2019:     Assessment & Plan:     ICD-10-CM   1. Solitary pulmonary nodule  R91.1 CT CHEST WO CONTRAST   More like a cluster of tiny nodules May represent indolent process versus scar Repeat CT if stable it would be 1 year, no further imaging necessary  2. Sleep apnea in adult  G47.30    Continue use of CPAP May need redo mask fitting Will refer to sleep medicine after next visit  3. Periodic limb movement disorder  G47.61    Continue anti-Parkinson's medications Continue CPAP  4. Parkinson's disease ([place])  G20    This issue adds complexity to his management Continue follow-up with neurology   Orders Placed This Encounter  Procedures  . CT CHEST WO CONTRAST    Standing Status:  Future    Standing Expiration Date:   10/10/2021    Scheduling Instructions:     11/2020    Order Specific Question:   Preferred imaging location?    Answer:   [facility]   We will see the patient in follow-up in 3 months time he is to contact us prior to that time should any new difficulties arise.  Renold Don, MD [facility]   *This note was dictated using voice recognition software/Dragon.  Despite best efforts to proofread, errors can occur which can change the meaning.  Any change was purely unintentional.

## 2020-10-12 DIAGNOSIS — M9901 Segmental and somatic dysfunction of cervical region: Secondary | ICD-10-CM | POA: Diagnosis not present

## 2020-10-12 DIAGNOSIS — M5134 Other intervertebral disc degeneration, thoracic region: Secondary | ICD-10-CM | POA: Diagnosis not present

## 2020-10-12 DIAGNOSIS — M9902 Segmental and somatic dysfunction of thoracic region: Secondary | ICD-10-CM | POA: Diagnosis not present

## 2020-10-12 DIAGNOSIS — M62830 Muscle spasm of back: Secondary | ICD-10-CM | POA: Diagnosis not present

## 2020-10-13 ENCOUNTER — Other Ambulatory Visit: Payer: Self-pay | Admitting: Cardiovascular Disease

## 2020-10-13 ENCOUNTER — Other Ambulatory Visit: Payer: Self-pay | Admitting: Internal Medicine

## 2020-10-13 ENCOUNTER — Other Ambulatory Visit: Payer: Self-pay | Admitting: Neurology

## 2020-10-17 ENCOUNTER — Other Ambulatory Visit: Payer: Self-pay | Admitting: Neurology

## 2020-10-19 ENCOUNTER — Encounter: Payer: Self-pay | Admitting: Family

## 2020-10-19 DIAGNOSIS — M5134 Other intervertebral disc degeneration, thoracic region: Secondary | ICD-10-CM | POA: Diagnosis not present

## 2020-10-19 DIAGNOSIS — M9902 Segmental and somatic dysfunction of thoracic region: Secondary | ICD-10-CM | POA: Diagnosis not present

## 2020-10-19 DIAGNOSIS — M62830 Muscle spasm of back: Secondary | ICD-10-CM | POA: Diagnosis not present

## 2020-10-19 DIAGNOSIS — M9901 Segmental and somatic dysfunction of cervical region: Secondary | ICD-10-CM | POA: Diagnosis not present

## 2020-10-22 ENCOUNTER — Other Ambulatory Visit: Payer: Self-pay | Admitting: Neurology

## 2020-10-22 ENCOUNTER — Other Ambulatory Visit: Payer: Self-pay

## 2020-10-22 MED ORDER — CARBIDOPA-LEVODOPA ER 50-200 MG PO TBCR: 1.0000 | EXTENDED_RELEASE_TABLET | Freq: Every day | ORAL | 0 refills | Status: DC

## 2020-10-22 MED ORDER — CARBIDOPA-LEVODOPA 25-100 MG PO TABS: ORAL_TABLET | ORAL | 0 refills | Status: DC

## 2020-10-26 DIAGNOSIS — M62830 Muscle spasm of back: Secondary | ICD-10-CM | POA: Diagnosis not present

## 2020-10-26 DIAGNOSIS — M9902 Segmental and somatic dysfunction of thoracic region: Secondary | ICD-10-CM | POA: Diagnosis not present

## 2020-10-26 DIAGNOSIS — M5134 Other intervertebral disc degeneration, thoracic region: Secondary | ICD-10-CM | POA: Diagnosis not present

## 2020-10-26 DIAGNOSIS — M9901 Segmental and somatic dysfunction of cervical region: Secondary | ICD-10-CM | POA: Diagnosis not present

## 2020-10-28 NOTE — Progress Notes (Signed)
Assessment/Plan:   1.  Parkinsons Disease  -Continue carbidopa/levodopa 25/100, 1.5 tablets 3 times per day.  Told him he could take an extra if needed.  -Continue carbidopa/levodopa 50/200 CR at bedtime.  -We discussed that it used to be thought that levodopa would increase risk of melanoma but now it is believed that Parkinsons itself likely increases risk of melanoma. he is to get regular skin checks.   2.  RLS/RBD  -Continue clonazepam 0.5 mg, 1.5 tablets q hs.  Understands addictive postential  -discussed using bed rails  -add melatonin 3 mg at bedtime.  -Patient with low ferritin levels.  Iron recommended but he didn't take it.  Feels that he is doing fine in terms of restless leg.  3.  Sleep apnea  -On CPAP.  4.  Pulmonary hypertension  -Follows with cardiology and pulmonary  5.  Lightheadedness  -on multiple antihypertensives  -Would like him to take his blood pressure in various positions, including while standing up.  He has taken blood pressure at home and generally runs high, but usually takes it while sitting.  6.  MCI  -pt with neurocog testing in Jan and demonstrated only MCI.  No evidence of a mood disorder.   Subjective:   [name]. was seen today in follow up for Parkinsons disease.  My previous records were reviewed prior to todays visit as well as outside records available to me. pts wife with patient and supplements the hx.  Pt with no falls since last visit.  States that he has better and worse days and mostly a worse days is a worse day when he doesn't sleep well.  He notes a "worse day" is defined by tremor in the L hand.   Pt with some lightheadedness, near syncope.  BP generally running on the higher end though and not low.   No hallucinations.  Mood has been good.  Patient had neurocognitive testing since our last visit.  There was no evidence of dementia.  There was only evidence of MCI.  No evidence of a mood disorder.  Patient will increase  his clonazepam since last visit.  He used to be on 1-1/2 tablets nightly, but last time he reported he had dropped it down to 1 tablet nightly.  He has since gone back up to 1.5 tablets.  Wife reports that he is wild in the bed and may become "animated" in the dreams.  No falling out of the bed in a long time  Current prescribed movement disorder medications: Carbidopa/levodopa 25/100, 1.5 tablet 3 times per day (increased last visit) Carbidopa/levodopa 50/200 CR at bedtime  Clonazepam, 0.5 mg, 1.5 tablets nightly    ALLERGIES:  No Known Allergies  CURRENT MEDICATIONS:  Outpatient Encounter Medications as of 10/30/2020  Medication Sig  . aspirin EC 81 MG tablet Take 1 tablet (81 mg total) by mouth at bedtime.  . carbidopa-levodopa (SINEMET CR) 50-200 MG tablet Take 1 tablet by mouth at bedtime.  . carbidopa-levodopa (SINEMET IR) 25-100 MG tablet TAKE 1.5 TABLETS BY MOUTH THREE TIMES A DAY  . carvedilol (COREG) 6.25 MG tablet TAKE ONE TABLET BY MOUTH TWICE A DAY  . clonazePAM (KLONOPIN) 0.5 MG tablet TAKE ONE AND ONE-HALF TABLETS BY MOUTH EVERY NIGHT AT BEDTIME  . furosemide (LASIX) 20 MG tablet Take 1 tablet (20 mg total) by mouth daily.  . isosorbide mononitrate (IMDUR) 60 MG 24 hr tablet Take 1 tablet (60 mg total) by mouth daily.  Marland Kitchen losartan (  COZAAR) 100 MG tablet Take 1 tablet (100 mg total) by mouth daily.  . Multiple Vitamin (MULTIVITAMIN) tablet Take 1 tablet by mouth daily.  . nitroGLYCERIN (NITROSTAT) 0.4 MG SL tablet Place 1 tablet (0.4 mg total) under the tongue every 5 (five) minutes as needed for chest pain.  . Omega-3 Fatty Acids (FISH OIL) 1200 MG CAPS Take 1,200 mg by mouth daily.   Marland Kitchen omeprazole (PRILOSEC) 20 MG capsule TAKE ONE CAPSULE BY MOUTH EVERY MORNING  . polyethylene glycol (MIRALAX / GLYCOLAX) packet Take 17 g by mouth daily.   . ranolazine (RANEXA) 1000 MG SR tablet TAKE ONE TABLET BY MOUTH TWICE A DAY  . tamsulosin (FLOMAX) 0.4 MG CAPS capsule TAKE ONE CAPSULE  BY MOUTH DAILY  . rosuvastatin (CRESTOR) 10 MG tablet Take 1 tablet (10 mg total) by mouth daily.   No facility-administered encounter medications on file as of 10/30/2020.    Objective:   PHYSICAL EXAMINATION:    VITALS:   Vitals:   10/30/20 1416  BP: 140/62  Pulse: (!) 51  SpO2: 98%  Weight: 188 lb (85.3 kg)  Height: 5\' 7"  (1.702 m)    GEN:  The patient appears stated age and is in NAD. HEENT:  Normocephalic, atraumatic.  The mucous membranes are moist. The superficial temporal arteries are without ropiness or tenderness.  CV:  Loletha Grayer.  Regular Lungs: CTAB  Neurological examination:  Orientation: The patient is alert and oriented x3. Cranial nerves: There is good facial symmetry with facial hypomimia. The speech is fluent and clear. Soft palate rises symmetrically and there is no tongue deviation. Hearing is intact to conversational tone. Sensation: Sensation is intact to light touch throughout Motor: Strength is at least antigravity x4.  Movement examination: Tone: There is nl tone in the ue/le Abnormal movements: there is intermittent RUE rest tremor Coordination:  There is no decremation with RAM's, with any form of RAMS, including alternating supination and pronation of the forearm, hand opening and closing, finger taps, heel taps and toe taps. Gait and Station: The patient has no difficulty arising out of a deep-seated chair without the use of the hands. The patient's stride length is good.      Total time spent on today's visit was 30 minutes, including both face-to-face time and nonface-to-face time.  Time included that spent on review of records (prior notes available to me/labs/imaging if pertinent), discussing treatment and goals, answering patient's questions and coordinating care.  Cc:  [name], MD

## 2020-10-30 ENCOUNTER — Ambulatory Visit (INDEPENDENT_AMBULATORY_CARE_PROVIDER_SITE_OTHER): Payer: Medicare Other | Admitting: Neurology

## 2020-10-30 ENCOUNTER — Other Ambulatory Visit: Payer: Self-pay

## 2020-10-30 ENCOUNTER — Encounter: Payer: Self-pay | Admitting: Neurology

## 2020-10-30 VITALS — BP 140/62 | HR 51 | Ht 67.0 in | Wt 188.0 lb

## 2020-10-30 DIAGNOSIS — I208 Other forms of angina pectoris: Secondary | ICD-10-CM | POA: Diagnosis not present

## 2020-10-30 DIAGNOSIS — G4752 REM sleep behavior disorder: Secondary | ICD-10-CM

## 2020-10-30 DIAGNOSIS — G20 Parkinson's disease: Secondary | ICD-10-CM

## 2020-10-30 MED ORDER — CARBIDOPA-LEVODOPA ER 50-200 MG PO TBCR: 1.0000 | EXTENDED_RELEASE_TABLET | Freq: Every day | ORAL | 2 refills | Status: DC

## 2020-10-30 NOTE — Patient Instructions (Signed)
1.  Add melatonin 3mg  to your klonopin at bed 2.  You can take an extra levodopa as needed.   T-Shirt design contest   SUBMIT UNIQUE DESIGN TO WIN!  Winning design will be featured on T-shirts to support our local Parkinson's patients.   Contest rules and information in on link below .   Contest ends on August 31,2022    How to [employer] your own unique quote or a design that will stand out and support the ideas behind Parkinson's Awareness  This contest is open to everyone. The winning design or quote will be selected by a group of unbiased judges. Judges will vote based on quality, relevance, and aesthetic. Judges will not have access to the names associated with the submission. Submissions accepted until: February 06, 2021 What You Win Your own quote or design will be printed on a t-shirt that will support Parkinson's Awareness!! Feel good knowing your quote/artwork will help spread awareness for Parkinson's contribute and 100% of profits to the local area . Tips for Submissions We will accept submissions in the form of quotes, Financial risk analyst, or hand drawn artwork.  Mission/Theme: The Power of One ! Working together to support and treat to improve quality of life for Parkinson's Patients Rules & Regulations All designs and quotes must be original and not subject to copyright of another person or entity. Distribution and Reproduction Rights for all quotes and artwork will be transferred to [facility] [employer] upon submission to the contest via the [employer] form on February 06, 2021   Online Resources for Power over Parkinson's Group May 2022  . Local [place] Online Groups  o Power over [employer] Group :   - Power Over Parkinson's Patient Education Group will be Wednesday, May 11th at 2pm via Zoom.   - Upcoming Power over Parkinson's Meetings:  2nd Wednesdays of the month at 2 pm:  June 8th, July 13th - Contact Amy Marriott at  [email or web address] if interested in participating in this online group o Parkinson's Care Partners Group:    3rd Mondays, Contact Misty Paladino o Atypical Parkinsonian Patient Group:   4th Wednesdays, Contact Misty Paladino o If you are interested in participating in these online groups with Misty, please contact her directly for how to join those meetings.  Her contact information is [email or web address].   . [place]:  [email or web address] o PD Health at Home continues:  Mindfulness Mondays, Expert Briefing Tuesdays, Wellness Wednesdays, Take Time Thursdays, Fitness Fridays -Listings for May 2022 are on the website o Upcoming Webinar:  Newly Diagnosed Building a Better Life with Parkinson   Wednesday, May 18th @ 1 pm o Register for Armed forces operational officer) at [email or web address] o  Please check out their website to sign up for emails and see their full online offerings  . [place]:  [email or web address]  o Upcoming Webinar:   What's in your DNA, understanding Parkinson's genetics.  Thursday, May 19th @ 12 noon o Check out additional information on their website to see their full online offerings  . [place]:  [email or web address] o Upcoming Webinar:  Stay tuned o Care Partner Monthly Meetup.  With [place].  First Tuesday of each month, 2 pm o Joy Breaks:  First Wednesday of each month, 2-3 pm. There will be art, doodling, making, crafting, listening, laughing, stories, and everything in between. No art experience necessary. No supplies required. Just show up for joy!  Register on their website. o Check out additional information  to Live Well Today on their website  . Parkinson and Movement Disorders (PMD) Alliance:  www.pmdalliance.org o [employer] Online:  Online Education Events o Sign up for emails, which are sent weekly to give you updates on programming and online  offerings     . Parkinson's Association of the [place]:  www.[email or web address] o Information on online support groups, education events, and online exercises including Yoga, Parkinson's exercises and more-LOTS of information on links to PD resources and online events o Virtual Support Group through Parkinson's Association of the [place]; next one is scheduled for Wednesday, May 4th, 2022 at 2 pm. (These are typically scheduled for the 1st Wednesday of the month at 2 pm).  Visit website for details.  . Additional links for movement activities: o PWR! Moves Classes at [facility] RESUMED!  Wednesdays 10 and 11 am.  Contact Amy Marriott, PT [email or web address] or [phone] if interested o Here is a link to the PWR!Moves classes on Zoom from [place] - Daily Mon-Sat at 10:00. Via Zoom, FREE and open to all.  There is also a link below via Facebook if you use that platform. - [email or web address].si - [email or web address]://[email or web address].no o Parkinson's Wellness Recovery (PWR! Moves)  www.[email or web address] - Info on the PWR! Virtual Experience:  You will have access to our expertise through self-assessment, guided plans that start with the PD-specific fundamentals, educational content, tips, Q&A with an expert, and a growing Art therapist of PD-specific pre-recorded and live exercise classes of varying types and intensity - both physical and cognitive! If that is not enough, we offer 1:1 wellness consultations (in-person or virtual) to personalize your PWR! Research scientist (medical).  - Check out the PWR! Move of the month on the [place] [facility] website:  [email or web address] o [employer] Fridays:  - As part of the PD Health @ Home program,  this free video series focuses each week on one aspect of fitness designed to support people living with Parkinson's.  These weekly videos highlight the [place] recent fitness guidelines for people with Parkinson's disease. -  [email or web address].dk o Dance for PD website is offering free, live-stream classes throughout the week, as well as links to [employer] of classes:  [email or web address] o Dance for Parkinson's Class:  [place].  Free offering for people with Parkinson's and care partners; virtual class.  o For more information, contact [phone] or email Ruffin Frederick at [email or web address] o Virtual dance and Pilates for Parkinson's classes: Click on the Community Tab> Parkinson's Movement Initiative Tab.  To register for classes and for more information, visit [email or web address] and click the "community" tab.     o [employer] Parkinson's Cycling Classes  - [facility]: 1pm on Fridays-Live classes at [employer] ([facility] [email or web address] or [phone]) [facility] [employer]: Virtual Classes Mondays and Thursdays Jeanette Caprice classes Tuesday, Wednesday and Thursday (contact [place] at [email or web address]  or [phone])  o [place] levels of classes are offered Tuesdays and Thursdays:  10:30 am,  12 noon & 1:45 pm at [facility].  - Active Stretching with Paula Compton Class starting in March, on Fridays - To observe a class or for  more information, call [phone] or email [email or web address] . Well[employer]Spring [employer]: o Chief Technology Officer Opportunities:  www.well-[email or web address]/caregiver-education/caregiver-support-group.  You may also contact Vickki Muff at [email or web address] or [phone].   o [facility] for Family Caregivers! Thursday, May 12th  10:00a-1:30p [place] 09-16-1980, [employer], [place], [place] You  may contact Vickki Muff at [email or web address] or [phone].   o Well-Spring Navigator:  Just1Navigator program, a free service to help individuals and families through the journey of determining care for older adults.  The "Navigator" is a [phone], Education officer, museum, who will speak with a prospective client and/or loved ones to provide an assessment of the situation and a set of recommendations for a personalized care plan -- all free of charge, and whether Well[employer]Spring [employer] offers the needed service or not. If the need is not a service we provide, we are well-connected with reputable programs in town that we can refer you to.  www.well-[email or web address] or to speak with the Navigator, call [phone].

## 2020-11-07 ENCOUNTER — Telehealth: Payer: Self-pay | Admitting: Neurology

## 2020-11-07 NOTE — Telephone Encounter (Signed)
Called and patients wife picked up. Stated that patient was asleep. Looked on patients DPR to ensure I may speak to her and patients wife is not listed. Patients wife expressed her fruistration and surpise about not being on the [facility]. Informed patients wife that I cannot speak to her and I need to speak to the patient. She stated he was asleep. Patients wife would not notify patient that I needed to speak to him.  Spoke to Dr. Carles Collet and informed her of the situation with patients wife and DPR. Dr. Carles Collet stated to call patient back in a few minutes and if wife does not get patient on the phone we will call EMS to do a well check.   Patient needs to be notified that he needs to be evaluated at the ER.   Will call back in a few.

## 2020-11-07 NOTE — Telephone Encounter (Signed)
Called patient and wife picked up. Ask to speak to patient and patients wife stated that I cannot because he is asleep. Asked patients wife again if she could please wake patient up because I need to speak to him and again she said no. Advised patients wife that if she did not let me speak to patient that I will have to call EMS to do a well check on patient. Patients wife said "ok." Informed patient that I will hang up to call and patients wife verbalized understanding.   Called 911 and an ambulance is on the way to do a wellcheck. Dr. Carles Collet is aware.

## 2020-11-07 NOTE — Telephone Encounter (Signed)
Pt needs to go to ER.  This is very unlike him and needs evaluated acutely.  Parkinsons Disease wouldn't cause a sudden change like this (and I saw him last week).

## 2020-11-07 NOTE — Telephone Encounter (Signed)
Please see and I will be out of office, send to [facility] she is aware! Thanks you

## 2020-11-07 NOTE — Telephone Encounter (Signed)
Patient called in stating he woke up this morning feeling "terrable". His legs were so weak he couldn't get up. His wife had to go get the neighbor to help him get up. He still cannot get up. He cannot move to use the bathroom or anything. He stated he has not had a bowel movement in about 6 days.

## 2020-11-08 ENCOUNTER — Other Ambulatory Visit: Payer: Self-pay

## 2020-11-08 ENCOUNTER — Encounter (HOSPITAL_COMMUNITY): Payer: Self-pay

## 2020-11-08 ENCOUNTER — Emergency Department (HOSPITAL_COMMUNITY)
Admission: EM | Admit: 2020-11-08 | Discharge: 2020-11-08 | Disposition: A | Discharge status: Home / Self Care | Payer: Medicare Other | Attending: Emergency Medicine | Admitting: Emergency Medicine

## 2020-11-08 ENCOUNTER — Emergency Department (HOSPITAL_COMMUNITY): Payer: Medicare Other

## 2020-11-08 DIAGNOSIS — Z7982 Long term (current) use of aspirin: Secondary | ICD-10-CM | POA: Insufficient documentation

## 2020-11-08 DIAGNOSIS — Z951 Presence of aortocoronary bypass graft: Secondary | ICD-10-CM | POA: Insufficient documentation

## 2020-11-08 DIAGNOSIS — Z85828 Personal history of other malignant neoplasm of skin: Secondary | ICD-10-CM | POA: Insufficient documentation

## 2020-11-08 DIAGNOSIS — R29898 Other symptoms and signs involving the musculoskeletal system: Secondary | ICD-10-CM

## 2020-11-08 DIAGNOSIS — R531 Weakness: Secondary | ICD-10-CM | POA: Diagnosis not present

## 2020-11-08 DIAGNOSIS — N183 Chronic kidney disease, stage 3 unspecified: Secondary | ICD-10-CM | POA: Insufficient documentation

## 2020-11-08 DIAGNOSIS — G20 Parkinson's disease: Secondary | ICD-10-CM | POA: Diagnosis not present

## 2020-11-08 DIAGNOSIS — G319 Degenerative disease of nervous system, unspecified: Secondary | ICD-10-CM | POA: Diagnosis not present

## 2020-11-08 DIAGNOSIS — Z79899 Other long term (current) drug therapy: Secondary | ICD-10-CM | POA: Insufficient documentation

## 2020-11-08 DIAGNOSIS — E1122 Type 2 diabetes mellitus with diabetic chronic kidney disease: Secondary | ICD-10-CM | POA: Insufficient documentation

## 2020-11-08 DIAGNOSIS — R0902 Hypoxemia: Secondary | ICD-10-CM | POA: Diagnosis not present

## 2020-11-08 DIAGNOSIS — I129 Hypertensive chronic kidney disease with stage 1 through stage 4 chronic kidney disease, or unspecified chronic kidney disease: Secondary | ICD-10-CM | POA: Insufficient documentation

## 2020-11-08 DIAGNOSIS — Z7722 Contact with and (suspected) exposure to environmental tobacco smoke (acute) (chronic): Secondary | ICD-10-CM | POA: Diagnosis not present

## 2020-11-08 DIAGNOSIS — I6782 Cerebral ischemia: Secondary | ICD-10-CM | POA: Diagnosis not present

## 2020-11-08 DIAGNOSIS — I10 Essential (primary) hypertension: Secondary | ICD-10-CM | POA: Diagnosis not present

## 2020-11-08 DIAGNOSIS — I959 Hypotension, unspecified: Secondary | ICD-10-CM | POA: Diagnosis not present

## 2020-11-08 LAB — CBC WITH DIFFERENTIAL/PLATELET
Abs Immature Granulocytes: 0.03 10*3/uL (ref 0.00–0.07)
Basophils Absolute: 0 10*3/uL (ref 0.0–0.1)
Basophils Relative: 0 %
Eosinophils Absolute: 0 10*3/uL (ref 0.0–0.5)
Eosinophils Relative: 1 %
HCT: 35.8 % — ABNORMAL LOW (ref 39.0–52.0)
Hemoglobin: 11.9 g/dL — ABNORMAL LOW (ref 13.0–17.0)
Immature Granulocytes: 1 %
Lymphocytes Relative: 16 %
Lymphs Abs: 0.9 10*3/uL (ref 0.7–4.0)
MCH: 33.6 pg (ref 26.0–34.0)
MCHC: 33.2 g/dL (ref 30.0–36.0)
MCV: 101.1 fL — ABNORMAL HIGH (ref 80.0–100.0)
Monocytes Absolute: 0.4 10*3/uL (ref 0.1–1.0)
Monocytes Relative: 6 %
Neutro Abs: 4.2 10*3/uL (ref 1.7–7.7)
Neutrophils Relative %: 76 %
Platelets: 157 10*3/uL (ref 150–400)
RBC: 3.54 MIL/uL — ABNORMAL LOW (ref 4.22–5.81)
RDW: 12.2 % (ref 11.5–15.5)
WBC: 5.5 10*3/uL (ref 4.0–10.5)
nRBC: 0 % (ref 0.0–0.2)

## 2020-11-08 LAB — URINALYSIS, ROUTINE W REFLEX MICROSCOPIC
Bilirubin Urine: NEGATIVE
Glucose, UA: NEGATIVE mg/dL
Hgb urine dipstick: NEGATIVE
Ketones, ur: 20 mg/dL — AB
Nitrite: NEGATIVE
Protein, ur: 100 mg/dL — AB
Specific Gravity, Urine: 1.019 (ref 1.005–1.030)
pH: 5 (ref 5.0–8.0)

## 2020-11-08 LAB — COMPREHENSIVE METABOLIC PANEL
ALT: 7 U/L (ref 0–44)
AST: 21 U/L (ref 15–41)
Albumin: 3.7 g/dL (ref 3.5–5.0)
Alkaline Phosphatase: 71 U/L (ref 38–126)
Anion gap: 12 (ref 5–15)
BUN: 26 mg/dL — ABNORMAL HIGH (ref 8–23)
CO2: 21 mmol/L — ABNORMAL LOW (ref 22–32)
Calcium: 9.5 mg/dL (ref 8.9–10.3)
Chloride: 102 mmol/L (ref 98–111)
Creatinine, Ser: 2.15 mg/dL — ABNORMAL HIGH (ref 0.61–1.24)
GFR, Estimated: 31 mL/min — ABNORMAL LOW (ref >60)
Glucose, Bld: 130 mg/dL — ABNORMAL HIGH (ref 70–99)
Potassium: 4.1 mmol/L (ref 3.5–5.1)
Sodium: 135 mmol/L (ref 135–145)
Total Bilirubin: 1.5 mg/dL — ABNORMAL HIGH (ref 0.3–1.2)
Total Protein: 7 g/dL (ref 6.5–8.1)

## 2020-11-08 LAB — VITAMIN B12: Vitamin B-12: 537 pg/mL (ref 180–914)

## 2020-11-08 MED ORDER — CEPHALEXIN 500 MG PO CAPS
500.0000 mg | ORAL_CAPSULE | Freq: Two times a day (BID) | ORAL | 0 refills | Status: DC
Start: 2020-11-08 — End: 2020-11-23

## 2020-11-08 NOTE — Discharge Instructions (Addendum)
Follow-up with your primary care doctor for recheck.  We did order a vitamin B12 level.  Please let your doctor know to follow-up on this.  Also your kidney function was slightly worse than it has been.  Have your doctor follow-up on this as well.

## 2020-11-08 NOTE — Telephone Encounter (Signed)
Left message on voicemail to call office back at 1:09pm

## 2020-11-08 NOTE — Telephone Encounter (Signed)
Patient was seen in ER today 11/08/20 and discharged. Dr. Carles Collet aware.

## 2020-11-08 NOTE — ED Triage Notes (Signed)
Pt from home via ems; c/o intermittent bilateral leg heaviness, first episode Monday lasting a few hours, 2 episodes yesterday; hx Parkinsons disease; denies falls, heavy lifting; pt able to ambulate; denies pain; denies weakness otherwise, denies current leg heaviness  161/53 HR 56 94% RA CBG 149

## 2020-11-08 NOTE — ED Notes (Signed)
Pt ambulatory in room with steady gait

## 2020-11-08 NOTE — ED Notes (Signed)
Pt transported to CT ?

## 2020-11-08 NOTE — Telephone Encounter (Signed)
Call and check on patient today.  Don't see he went to ER. If not doing well, needs to contact PCP today

## 2020-11-08 NOTE — Telephone Encounter (Signed)
Went into [facility]. Will call back.

## 2020-11-08 NOTE — ED Provider Notes (Addendum)
[facility] EMERGENCY DEPARTMENT Provider Note   CSN: [id number] Arrival date & time: 11/08/20  1937     History No chief complaint on file.   Johnathan Arnold. is a 75 y.o. male.  Patient is a 75 year old male who presents for leg weakness.  He has a history of diabetes, Parkinson's, hypertension, hyperlipidemia and chronic kidney disease.  He reports bilateral leg weakness.  He states 2 days ago he was getting in bed and his legs collapsed on him.  He collapsed to the floor although did not injure himself.  He states his legs just felt like they were dead under him.  He was not able to get back into the bed.  A neighbor came and helped him.  Yesterday in the morning he felt better.  He was able to walk and go to the bathroom with assistance.  He apparently had contacted his neurologist who sent EMS to do a well check on him.  This was yesterday he said the EMS came out and he was feeling better so he was not transported.  He said yesterday afternoon the same thing happen where his legs felt weak and he was not able to get back in the bed.  He collapsed to the floor again and neighbor had to help him get back into bed.  He said today he feels better.  He is able to ambulate with some assistance.  He feels like his arms are little bit weaker than normal as well.  He denies any recent falls.  No neck or back pain other than he has some chronic pain in his back which is unchanged.  No numbness in his extremities.  No headache.  No fevers.  He says overall he does not feel bad.  No vomiting or diarrhea.  No recent medication changes.  He had a recent visit with his neurologist, Dr. Carles Collet and was not having any of these issues.        Past Medical History:  Diagnosis Date  . 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 Diabetes mellitus without complication ([place])   . History of cardiac catheterization 2011   [facility]  . Hyperlipidemia   . Hypertension   .  Hypertriglyceridemia   . Parkinson's disease ([place])   . 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 Vertigo     Patient Active Problem List   Diagnosis Date Noted  . Prostate cancer screening 09/08/2020  . 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 Low back pain of over 3 months duration 08/19/2019  . Pulmonary hypertension ([place])   . Insomnia 12/21/2018  . Sleep apnea in adult 12/09/2018  . Periodic limb movement disorder 12/09/2018  . Abnormal findings on diagnostic imaging of lung 07/09/2018  . Lung nodule 07/09/2018  . Pulmonary nodules 05/18/2018  . Wears hearing aid in both ears 05/17/2018  . Leg pain, bilateral 02/20/2018  . Dyspnea   . Effort angina (HCC)   . CKD (chronic kidney disease) stage 3, GFR 30-59 ml/min (HCC) 09/21/2017  . History of skin cancer in adulthood 08/14/2016  . Parkinson's disease ([place]) 08/09/2016  . Bilateral carotid artery stenosis 04/02/2015  . Vertigo, peripheral 10/17/2014  . Benign prostatic hypertrophy with urinary frequency 01/31/2014  . Encounter for Medicare annual wellness exam 07/02/2013  . Obesity 04/03/2013  . Other malaise and fatigue 09/21/2012  . Hyperlipidemia   . Hypertension   . 3-vessel coronary  artery disease   . S/P CABG x 5   . 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 Angina pectoris associated with type 2 diabetes mellitus ([place]) 05/21/2011    Past Surgical History:  Procedure Laterality Date  . CARDIAC CATHETERIZATION  05-19-2010   [facility]: Patent grafts. LIMA to LAD, SVG to D1, OM1 and RPDA  . CORONARY ARTERY BYPASS GRAFT  03/1998   5 vessel, [facility]  . RIGHT HEART CATH N/A 04/04/2019   Procedure: RIGHT HEART CATH;  Surgeon: Wellington Hampshire, MD;  Location: [place] [facility];  Service: Cardiovascular;  Laterality: N/A;  . RIGHT/LEFT HEART CATH AND CORONARY ANGIOGRAPHY N/A 02/01/2018   Procedure: RIGHT/LEFT HEART CATH AND CORONARY ANGIOGRAPHY;  Surgeon: Wellington Hampshire, MD;  Location: [facility];  Service: Cardiovascular;  Laterality: N/A;       Family History  Problem Relation Age of Onset  . Heart attack Mother 32  . Hypertension Mother   . Heart attack Father 43  . Heart disease Father   . Heart disease Brother   . Healthy Son     Social History   Tobacco Use  . Smoking status: Passive Smoke Exposure - Never Smoker  . Smokeless tobacco: Never Used  Vaping Use  . Vaping Use: Never used  Substance Use Topics  . Alcohol use: Yes    Comment: occasional beer  . Drug use: No    Home Medications Prior to Admission medications   Medication Sig Start Date End Date Taking? Authorizing Provider  cephALEXin (KEFLEX) 500 MG capsule Take 1 capsule (500 mg total) by mouth 2 (two) times daily. 11/08/20  Yes Malvin Johns, MD  aspirin EC 81 MG tablet Take 1 tablet (81 mg total) by mouth at bedtime. 09/28/20   Loel Dubonnet, NP  carbidopa-levodopa (SINEMET CR) 50-200 MG tablet Take 1 tablet by mouth at bedtime. 10/30/20   Tat, Eustace Quail, DO  carbidopa-levodopa (SINEMET IR) 25-100 MG tablet TAKE 1.5 TABLETS BY MOUTH THREE TIMES A DAY 10/22/20   Tat, Eustace Quail, DO  carvedilol (COREG) 6.25 MG tablet TAKE ONE TABLET BY MOUTH TWICE A DAY 07/27/20   Wellington Hampshire, MD  clonazePAM (KLONOPIN) 0.5 MG tablet TAKE ONE AND ONE-HALF TABLETS BY MOUTH EVERY NIGHT AT BEDTIME 10/22/20   Cameron Sprang, MD  furosemide (LASIX) 20 MG tablet Take 1 tablet (20 mg total) by mouth daily. 12/21/19 12/15/20  Wellington Hampshire, MD  isosorbide mononitrate (IMDUR) 60 MG 24 hr tablet Take 1 tablet (60 mg total) by mouth daily. 09/17/20   Wellington Hampshire, MD  losartan (COZAAR) 100 MG tablet Take 1 tablet (100 mg total) by mouth daily. 09/28/20 09/23/21  Loel Dubonnet, NP  Multiple Vitamin (MULTIVITAMIN) tablet Take 1 tablet by mouth daily.    [provider]  nitroGLYCERIN (NITROSTAT) 0.4 MG SL tablet Place 1 tablet (0.4 mg total) under the tongue every  5 (five) minutes as needed for chest pain. 09/28/20   Loel Dubonnet, NP  Omega-3 Fatty Acids (FISH OIL) 1200 MG CAPS Take 1,200 mg by mouth daily.     [provider]  omeprazole (PRILOSEC) 20 MG capsule TAKE ONE CAPSULE BY MOUTH EVERY MORNING 10/15/20   [name], MD  polyethylene glycol (MIRALAX / GLYCOLAX) packet Take 17 g by mouth daily.     [provider]  ranolazine (RANEXA) 1000 MG SR tablet TAKE ONE TABLET BY MOUTH TWICE A  DAY 10/15/20   Wellington Hampshire, MD  rosuvastatin (CRESTOR) 10 MG tablet Take 1 tablet (10 mg total) by mouth daily. 05/22/20 08/20/20  Wellington Hampshire, MD  tamsulosin (FLOMAX) 0.4 MG CAPS capsule TAKE ONE CAPSULE BY MOUTH DAILY 04/30/20   Hollice Espy, MD    Allergies    Patient has no known allergies.  Review of Systems   Review of Systems  Constitutional: Negative for chills, diaphoresis, fatigue and fever.  HENT: Negative for congestion, rhinorrhea and sneezing.   Eyes: Negative.   Respiratory: Negative for cough, chest tightness and shortness of breath.   Cardiovascular: Negative for chest pain and leg swelling.  Gastrointestinal: Negative for abdominal pain, blood in stool, diarrhea, nausea and vomiting.  Genitourinary: Negative for difficulty urinating, flank pain, frequency and hematuria.  Musculoskeletal: Negative for arthralgias and back pain.  Skin: Negative for rash.  Neurological: Positive for weakness. Negative for dizziness, speech difficulty, numbness and headaches.    Physical Exam Updated Vital Signs BP (!) 178/52   Pulse (!) 53   Temp 98.4 F (36.9 C) (Oral)   Resp 18   Ht 5\' 6"  (1.676 m)   Wt 83.9 kg   SpO2 100%   BMI 29.86 kg/m   Physical Exam Constitutional:      Appearance: He is well-developed.  HENT:     Head: Normocephalic and atraumatic.  Eyes:     Pupils: Pupils are equal, round, and reactive to light.  Cardiovascular:     Rate and Rhythm: Normal rate and regular rhythm.     Heart  sounds: Normal heart sounds.  Pulmonary:     Effort: Pulmonary effort is normal. No respiratory distress.     Breath sounds: Normal breath sounds. No wheezing or rales.  Chest:     Chest wall: No tenderness.  Abdominal:     General: Bowel sounds are normal.     Palpations: Abdomen is soft.     Tenderness: There is no abdominal tenderness. There is no guarding or rebound.  Musculoskeletal:        General: Normal range of motion.     Cervical back: Normal range of motion and neck supple.  Lymphadenopathy:     Cervical: No cervical adenopathy.  Skin:    General: Skin is warm and dry.     Findings: No rash.  Neurological:     Mental Status: He is alert and oriented to person, place, and time.     Comments: Motor 5/5 all extremities Sensation grossly intact to LT all extremities Finger to Nose intact, no pronator drift CN II-XII grossly intact Patellar reflexes intact      ED Results / Procedures / Treatments   Labs (all labs ordered are listed, but only abnormal results are displayed) Labs Reviewed  COMPREHENSIVE METABOLIC PANEL - Abnormal; Notable for the following components:      Result Value   CO2 21 (*)    Glucose, Bld 130 (*)    BUN 26 (*)    Creatinine, Ser 2.15 (*)    Total Bilirubin 1.5 (*)    GFR, Estimated 31 (*)    All other components within normal limits  CBC WITH DIFFERENTIAL/PLATELET - Abnormal; Notable for the following components:   RBC 3.54 (*)    Hemoglobin 11.9 (*)    HCT 35.8 (*)    MCV 101.1 (*)    All other components within normal limits  URINALYSIS, ROUTINE W REFLEX MICROSCOPIC - Abnormal; Notable for the following components:  Color, Urine AMBER (*)    Ketones, ur 20 (*)    Protein, ur 100 (*)    Leukocytes,Ua SMALL (*)    Bacteria, UA FEW (*)    All other components within normal limits  URINE CULTURE  VITAMIN B12    EKG EKG Interpretation  Date/Time:  Thursday November 08 2020 09:31:51 EDT Ventricular Rate:  56 PR  Interval:  172 QRS Duration: 86 QT Interval:  466 QTC Calculation: 450 R Axis:   56 Text Interpretation: Sinus rhythm Confirmed by Malvin Johns [phone]) on 11/08/2020 10:04:30 AM   Radiology CT Head Wo Contrast  Result Date: 11/08/2020 CLINICAL DATA:  Intermittent bilateral leg heaviness. History of Parkinson's disease. EXAM: CT HEAD WITHOUT CONTRAST TECHNIQUE: Contiguous axial images were obtained from the base of the skull through the vertex without intravenous contrast. COMPARISON:  CT head dated January 19, 2017. FINDINGS: Brain: No evidence of acute infarction, hemorrhage, hydrocephalus, extra-axial collection or mass lesion/mass effect. Stable mild atrophy and chronic microvascular ischemic changes. Vascular: Calcified atherosclerosis at the skullbase. No hyperdense vessel. Skull: Normal. Negative for fracture or focal lesion. Sinuses/Orbits: No acute finding. Other: None. IMPRESSION: 1. No acute intracranial abnormality. Stable mild atrophy and chronic microvascular ischemic changes. Electronically Signed   By: Titus Dubin M.D.   On: 11/08/2020 10:27    Procedures Procedures   Medications Ordered in ED Medications - No data to display  ED Course  I have reviewed the triage vital signs and the nursing notes.  Pertinent labs & imaging results that were available during my care of the patient were reviewed by me and considered in my medical decision making (see chart for details).    MDM Rules/Calculators/A&P                          Patient is a 75 year old who presents with leg weakness.  It seems to be intermittent in nature.  He feels back to baseline now.  His labs are nonconcerning.  He does not have any suggestions of cauda equina.  No associated spinal tenderness.  No fevers.  His head CT shows no acute abnormality.  He does not have any focal deficits.  I spoke with neurology, Dr. Curly Shores who did not have other concerns other than may be checking a vitamin B12 level.  He  says he feels back to baseline now.  He is able to ambulate in the room unassisted.  As I was discussing his findings, he does tell me that this is happened to him before and no ones ever been able to really figure out what is happened.  Given that he is back to baseline now.  Will discharge home.  He was advised to follow-up with his PCP for recheck.  We did check a vitamin B12 level and they advised him to notify his PCP to follow-up on that.  Return precautions were given.  His creatinine was slightly higher than his prior values.  I advised him to have his PCP follow-up on this.  His urine showed some subtle signs of infection.  He does state that he has had little bit of burning on urination over the last 2 days once I asked him again.  Given this, he was started on Keflex. Final Clinical Impression(s) / ED Diagnoses Final diagnoses:  Weakness of both lower extremities    Rx / DC Orders ED Discharge Orders         Ordered  cephALEXin (KEFLEX) 500 MG capsule  2 times daily        11/08/20 1346           Malvin Johns, MD 11/08/20 1343    Malvin Johns, MD 11/08/20 1354

## 2020-11-08 NOTE — ED Notes (Signed)
Patient verbalizes understanding of discharge instructions. Opportunity for questioning and answers were provided. Pt discharged from ED. 

## 2020-11-09 LAB — URINE CULTURE: Culture: NO GROWTH

## 2020-11-12 ENCOUNTER — Telehealth: Payer: Self-pay | Admitting: Neurology

## 2020-11-12 NOTE — Telephone Encounter (Signed)
He needs to go to PCP for f/u first.  Parkinsons Disease symptoms don't come on fast like this and I just saw him.  There has to be something else going on.

## 2020-11-12 NOTE — Telephone Encounter (Signed)
Patient called in stating he has been having trouble with his parkinson's lately. He is having weakness in his legs and no apetite. It's hard for him to stay up, he always just wants to lay in bed. He has been taking 2 or 2.5 tablets of his carbidopa-levodopa IR and that doesn't seem to help. He said he tried 3 tablets one morning and that seemed to work. He stated he went to the ED on Saturday, but they couldn't find anything.

## 2020-11-12 NOTE — Telephone Encounter (Signed)
Advised to patient.Voiced understanding and understood.

## 2020-11-13 ENCOUNTER — Ambulatory Visit (INDEPENDENT_AMBULATORY_CARE_PROVIDER_SITE_OTHER): Payer: Medicare Other | Admitting: Internal Medicine

## 2020-11-13 ENCOUNTER — Telehealth: Payer: Self-pay | Admitting: Neurology

## 2020-11-13 ENCOUNTER — Other Ambulatory Visit: Payer: Self-pay

## 2020-11-13 ENCOUNTER — Encounter: Payer: Self-pay | Admitting: Internal Medicine

## 2020-11-13 ENCOUNTER — Ambulatory Visit (INDEPENDENT_AMBULATORY_CARE_PROVIDER_SITE_OTHER): Payer: Medicare Other

## 2020-11-13 VITALS — BP 146/64 | HR 52 | Temp 97.9°F | Ht 66.0 in | Wt 185.4 lb

## 2020-11-13 DIAGNOSIS — J189 Pneumonia, unspecified organism: Secondary | ICD-10-CM

## 2020-11-13 DIAGNOSIS — M47814 Spondylosis without myelopathy or radiculopathy, thoracic region: Secondary | ICD-10-CM | POA: Diagnosis not present

## 2020-11-13 DIAGNOSIS — G20 Parkinson's disease: Secondary | ICD-10-CM

## 2020-11-13 DIAGNOSIS — R29898 Other symptoms and signs involving the musculoskeletal system: Secondary | ICD-10-CM | POA: Diagnosis not present

## 2020-11-13 DIAGNOSIS — M549 Dorsalgia, unspecified: Secondary | ICD-10-CM

## 2020-11-13 DIAGNOSIS — R269 Unspecified abnormalities of gait and mobility: Secondary | ICD-10-CM

## 2020-11-13 DIAGNOSIS — M47816 Spondylosis without myelopathy or radiculopathy, lumbar region: Secondary | ICD-10-CM

## 2020-11-13 DIAGNOSIS — R110 Nausea: Secondary | ICD-10-CM | POA: Diagnosis not present

## 2020-11-13 DIAGNOSIS — J984 Other disorders of lung: Secondary | ICD-10-CM | POA: Diagnosis not present

## 2020-11-13 DIAGNOSIS — R630 Anorexia: Secondary | ICD-10-CM

## 2020-11-13 DIAGNOSIS — M2578 Osteophyte, vertebrae: Secondary | ICD-10-CM | POA: Diagnosis not present

## 2020-11-13 DIAGNOSIS — R634 Abnormal weight loss: Secondary | ICD-10-CM

## 2020-11-13 DIAGNOSIS — R531 Weakness: Secondary | ICD-10-CM | POA: Diagnosis not present

## 2020-11-13 NOTE — Progress Notes (Deleted)
Assessment/Plan:   1.  Parkinsons Disease  -Continue carbidopa/levodopa 25/100, 1.5 tablets 3 times per day.  Told him he could take an extra if needed.  -Continue carbidopa/levodopa 50/200 CR at bedtime.  -We discussed that it used to be thought that levodopa would increase risk of melanoma but now it is believed that Parkinsons itself likely increases risk of melanoma. he is to get regular skin checks.   2.  RLS/RBD  -Continue clonazepam 0.5 mg, 1.5 tablets q hs.  Understands addictive postential  -discussed using bed rails  -add melatonin 3 mg at bedtime.  -Patient with low ferritin levels.  Iron recommended but he didn't take it.  Feels that he is doing fine in terms of restless leg.  3.  Sleep apnea  -On CPAP.  4.  Pulmonary hypertension  -Follows with cardiology and pulmonary  5.  Lightheadedness  -on multiple antihypertensives  -Would like him to take his blood pressure in various positions, including while standing up.  He has taken blood pressure at home and generally runs high, but usually takes it while sitting.  6.  MCI  -pt with neurocog testing in Jan and demonstrated only MCI.  No evidence of a mood disorder.    7.  Gait change/weakness, resolved after tx for pneuomonia?  -discussed with patient that rarely does Parkinsons Disease sx's decline acutely  -discussed with patient that all acute weakness (he could not get OOB) should be evaluated in ER.  -doubt from the low back - no back pain  -no evidence of gbs - has reflexes and bladder fxn intact  -occurred so acutely, I do worry about stroke although neuro exam is fairly nonfocal ***.  -suspect that this is metabolic, and potentially from pneumonia. Subjective:   Johnathan Arnold. was seen today in follow up for Parkinsons disease.  My previous records were reviewed prior to todays visit as well as outside records available to me. pts wife with patient and supplements the hx. patient last seen May 24.   Patient called in on June 1 stating that he felt terrible and could not get up.  It was so bad that they had to go to the neighbor's house to help him get out of the bed.  I had just seen him the week prior, so I knew this was very unusual for him.  His wife has significant speech delay and when we called back, she would not let us speak to him, so ultimately called 911 for a well check.  Patient declined to be taken to the hospital initially, but did go the following day.  He was seen in the emergency room on June 2.  Emergency room records indicate that strength was 5/5 in the upper and lower extremities and that "patellar reflexes intact."  Emergency room records indicate that the patient stated he felt that he was back to baseline and was able to ambulate in the room unassisted.  Physician records also state "as I was discussing his findings, he does tell me that this happened to him before and no one has ever been able to really figure out what happened."  He did call here and asked for a follow-up, but of asked him to follow-up with his primary care first, as I was concerned that something much more than Parkinson's disease was going on.  Primary care ended up ordering an EMG and MRI of the lumbar spine.  Patient was using a Rollator in the office.  MRI lumbar spine was completed on 11/15/20 and demonstrated multilevel degen changes but no significant CCS. there was compression deformity, chronic, at L1.  There was disc bulging.  Interestingly, his thoracic spine x-ray was suspicious for pneumonia and chest x-ray was recommended.  It was done the following day, and confirmed LLL pneumonia with L paraneumonic effusion  Current prescribed movement disorder medications: Carbidopa/levodopa 25/100, 1.5 tablet 3 times per day (increased last visit) Carbidopa/levodopa 50/200 CR at bedtime  Clonazepam, 0.5 mg, 1.5 tablets nightly     ALLERGIES:  No Known Allergies  CURRENT MEDICATIONS:  Outpatient Encounter  Medications as of 11/19/2020  Medication Sig   aspirin EC 81 MG tablet Take 1 tablet (81 mg total) by mouth at bedtime.   carbidopa-levodopa (SINEMET CR) 50-200 MG tablet Take 1 tablet by mouth at bedtime.   carbidopa-levodopa (SINEMET IR) 25-100 MG tablet TAKE 1.5 TABLETS BY MOUTH THREE TIMES A DAY   carvedilol (COREG) 6.25 MG tablet TAKE ONE TABLET BY MOUTH TWICE A DAY   cephALEXin (KEFLEX) 500 MG capsule Take 1 capsule (500 mg total) by mouth 2 (two) times daily.   clonazePAM (KLONOPIN) 0.5 MG tablet TAKE ONE AND ONE-HALF TABLETS BY MOUTH EVERY NIGHT AT BEDTIME   furosemide (LASIX) 20 MG tablet Take 1 tablet (20 mg total) by mouth daily.   isosorbide mononitrate (IMDUR) 60 MG 24 hr tablet Take 1 tablet (60 mg total) by mouth daily.   losartan (COZAAR) 100 MG tablet Take 1 tablet (100 mg total) by mouth daily.   Multiple Vitamin (MULTIVITAMIN) tablet Take 1 tablet by mouth daily.   nitroGLYCERIN (NITROSTAT) 0.4 MG SL tablet Place 1 tablet (0.4 mg total) under the tongue every 5 (five) minutes as needed for chest pain. (Patient not taking: Reported on 11/13/2020)   Omega-3 Fatty Acids (FISH OIL) 1200 MG CAPS Take 1,200 mg by mouth daily.    omeprazole (PRILOSEC) 20 MG capsule TAKE ONE CAPSULE BY MOUTH EVERY MORNING   polyethylene glycol (MIRALAX / GLYCOLAX) packet Take 17 g by mouth daily.    ranolazine (RANEXA) 1000 MG SR tablet TAKE ONE TABLET BY MOUTH TWICE A DAY   rosuvastatin (CRESTOR) 10 MG tablet Take 1 tablet (10 mg total) by mouth daily.   tamsulosin (FLOMAX) 0.4 MG CAPS capsule TAKE ONE CAPSULE BY MOUTH DAILY   No facility-administered encounter medications on file as of 11/19/2020.    Objective:   PHYSICAL EXAMINATION:    VITALS:   There were no vitals filed for this visit.  GEN:  The patient appears stated age and is in NAD. HEENT:  Normocephalic, atraumatic.  The mucous membranes are moist. The superficial temporal arteries are without ropiness or tenderness.  CV:  Loletha Grayer.   Regular Lungs: CTAB  Neurological examination:  Orientation: The patient is alert and oriented x3. Cranial nerves: There is good facial symmetry with facial hypomimia. The speech is fluent and clear. Soft palate rises symmetrically and there is no tongue deviation. Hearing is intact to conversational tone. Sensation: Sensation is intact to light touch throughout Motor: Strength is at least antigravity x4.  Movement examination: Tone: There is nl tone in the ue/le Abnormal movements: there is intermittent RUE rest tremor Coordination:  There is no decremation with RAM's, with any form of RAMS, including alternating supination and pronation of the forearm, hand opening and closing, finger taps, heel taps and toe taps. Gait and Station: The patient has no difficulty arising out of a deep-seated chair without the use of the  hands. The patient's stride length is good.      Total time spent on today's visit was *** minutes, including both face-to-face time and nonface-to-face time.  Time included that spent on review of records (prior notes available to me/labs/imaging if pertinent), discussing treatment and goals, answering patient's questions and coordinating care.  Cc:  [name], MD

## 2020-11-13 NOTE — Telephone Encounter (Signed)
PCP contacted me and wanted me to order EMG.  She didn't realize she could order herself and our staff was able to help with process and get it scheduled for PCP.  Discussed with PCP that they can then f/u on that and MRI lumbar spine.  Doesn't look like patient had back pain though.  ER documents patient had patellar reflexes.  No documentation today of reflexes.  Will work patient in on Monday (i'm out of office until then).  Johnathan Arnold, call patient and make sure that he is no weak on one side of body or numb on one side of body more than the other side of the body.  Also, ask him about control of bladder and bowel (make sure no changes or NEW loss of control).  We will work him in Monday but if weaker/new symptoms, go back to the ER

## 2020-11-13 NOTE — Progress Notes (Signed)
Chief Complaint  Patient presents with  . Hospitalization Follow-up   F/u with wife who has expressive aphasia  1. Leg weakness b/l 11/08/20 could not get up using rolling walker today with h/o PD CT head 11/08/20 negative placed on keflex suspected uti culture neg rec stop his legs feel tired and weak and 11/08/20 could not stand chronic leg weakness x years has done [facility] in the past will order emg/ncs per Dr. Carles Collet for Dr. Posey Pronto and she will f/u with pt h/o back arthritis and L1 compression deformity in 2019 Xray lumbar no h/o falls recently  Nothing tried    Review of Systems  Constitutional: Positive for weight loss.  HENT: Negative for hearing loss.   Respiratory: Negative for shortness of breath.   Cardiovascular: Negative for chest pain.  Musculoskeletal: Negative for falls.  Skin: Negative for rash.  Neurological: Positive for tremors and weakness.   Past Medical History:  Diagnosis Date  . 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 Diabetes mellitus without complication ([place])   . History of cardiac catheterization 2011   [facility]  . Hyperlipidemia   . Hypertension   . Hypertriglyceridemia   . Parkinson's disease ([place])   . 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 Vertigo    Past Surgical History:  Procedure Laterality Date  . CARDIAC CATHETERIZATION  05-19-2010   [facility]: Patent grafts. LIMA to LAD, SVG to D1, OM1 and RPDA  . CORONARY ARTERY BYPASS GRAFT  03/1998   5 vessel, [facility]  . RIGHT HEART CATH N/A 04/04/2019   Procedure: RIGHT HEART CATH;  Surgeon: Wellington Hampshire, MD;  Location: [facility];  Service: Cardiovascular;  Laterality: N/A;  . RIGHT/LEFT HEART CATH AND CORONARY ANGIOGRAPHY N/A 02/01/2018   Procedure: RIGHT/LEFT HEART CATH AND CORONARY ANGIOGRAPHY;  Surgeon: Wellington Hampshire, MD;  Location: [facility];  Service: Cardiovascular;  Laterality: N/A;   Family History  Problem Relation Age of Onset  . Heart attack Mother 34  .  Hypertension Mother   . Heart attack Father 83  . Heart disease Father   . Heart disease Brother   . Healthy Son    Social History   Socioeconomic History  . Marital status: Married    Spouse name: Not on file  . Number of children: 2  . Years of education: Not on file  . Highest education level: Master's degree (e.g., MA, MS, MEng, MEd, MSW, MBA)  Occupational History  . Occupation: retired    Comment: IT work  Tobacco Use  . Smoking status: Passive Smoke Exposure - Never Smoker  . Smokeless tobacco: Never Used  Vaping Use  . Vaping Use: Never used  Substance and Sexual Activity  . Alcohol use: Yes    Comment: occasional beer  . Drug use: No  . Sexual activity: Not Currently  Other Topics Concern  . Not on file  Social History Narrative   Right Handed    Lives in a two story home    Social Determinants of Health   Financial Resource Strain: Low Risk   . Difficulty of Paying Living Expenses: Not hard at all  Food Insecurity: No Food Insecurity  . Worried About Charity fundraiser in the Last Year: Never true  . Ran Out of Food in the Last Year: Never true  Transportation Needs: No Transportation Needs  . Lack of Transportation (Medical): No  . Lack of Transportation (Non-Medical):  No  Physical Activity: Sufficiently Active  . Days of Exercise per Week: 3 days  . Minutes of Exercise per Session: 60 min  Stress: No Stress Concern Present  . Feeling of Stress : Not at all  Social Connections: Unknown  . Frequency of Communication with Friends and Family: More than three times a week  . Frequency of Social Gatherings with Friends and Family: More than three times a week  . Attends Religious Services: Not on file  . Active Member of Clubs or Organizations: Yes  . Attends Archivist Meetings: More than 4 times per year  . Marital Status: Married  Human resources officer Violence: Not At Risk  . Fear of Current or Ex-Partner: No  . Emotionally Abused: No  .  Physically Abused: No  . Sexually Abused: No   Current Meds  Medication Sig  . aspirin EC 81 MG tablet Take 1 tablet (81 mg total) by mouth at bedtime.  . carbidopa-levodopa (SINEMET CR) 50-200 MG tablet Take 1 tablet by mouth at bedtime.  . carbidopa-levodopa (SINEMET IR) 25-100 MG tablet TAKE 1.5 TABLETS BY MOUTH THREE TIMES A DAY  . carvedilol (COREG) 6.25 MG tablet TAKE ONE TABLET BY MOUTH TWICE A DAY  . cephALEXin (KEFLEX) 500 MG capsule Take 1 capsule (500 mg total) by mouth 2 (two) times daily.  . clonazePAM (KLONOPIN) 0.5 MG tablet TAKE ONE AND ONE-HALF TABLETS BY MOUTH EVERY NIGHT AT BEDTIME  . furosemide (LASIX) 20 MG tablet Take 1 tablet (20 mg total) by mouth daily.  . isosorbide mononitrate (IMDUR) 60 MG 24 hr tablet Take 1 tablet (60 mg total) by mouth daily.  Marland Kitchen losartan (COZAAR) 100 MG tablet Take 1 tablet (100 mg total) by mouth daily.  . Multiple Vitamin (MULTIVITAMIN) tablet Take 1 tablet by mouth daily.  . Omega-3 Fatty Acids (FISH OIL) 1200 MG CAPS Take 1,200 mg by mouth daily.   Marland Kitchen omeprazole (PRILOSEC) 20 MG capsule TAKE ONE CAPSULE BY MOUTH EVERY MORNING  . polyethylene glycol (MIRALAX / GLYCOLAX) packet Take 17 g by mouth daily.   . ranolazine (RANEXA) 1000 MG SR tablet TAKE ONE TABLET BY MOUTH TWICE A DAY  . tamsulosin (FLOMAX) 0.4 MG CAPS capsule TAKE ONE CAPSULE BY MOUTH DAILY   No Known Allergies Recent Results (from the past 2160 hour(s))  Comprehensive metabolic panel     Status: Abnormal   Collection Time: 09/06/20 10:22 AM  Result Value Ref Range   Sodium 139 135 - 145 mEq/L   Potassium 4.7 3.5 - 5.1 mEq/L   Chloride 103 96 - 112 mEq/L   CO2 28 19 - 32 mEq/L   Glucose, Bld 117 (H) 70 - 99 mg/dL   BUN 22 6 - 23 mg/dL   Creatinine, Ser 1.92 (H) 0.40 - 1.50 mg/dL   Total Bilirubin 0.8 0.2 - 1.2 mg/dL   Alkaline Phosphatase 50 39 - 117 U/L   AST 16 0 - 37 U/L   ALT 7 0 - 53 U/L   Total Protein 6.4 6.0 - 8.3 g/dL   Albumin 4.3 3.5 - 5.2 g/dL   GFR  33.62 (L) >60.00 mL/min    Comment: Calculated using the CKD-EPI Creatinine Equation (2021)   Calcium 9.1 8.4 - 10.5 mg/dL  Lipid panel     Status: None   Collection Time: 09/06/20 10:22 AM  Result Value Ref Range   Cholesterol 121 0 - 200 mg/dL    Comment: ATP III Classification  Desirable:  < 200 mg/dL               Borderline High:  200 - 239 mg/dL          High:  > = 240 mg/dL   Triglycerides 77.0 0.0 - 149.0 mg/dL    Comment: Normal:  <150 mg/dLBorderline High:  150 - 199 mg/dL   HDL 65.30 >39.00 mg/dL   VLDL 15.4 0.0 - 40.0 mg/dL   LDL Cholesterol 40 0 - 99 mg/dL   Total CHOL/HDL Ratio 2     Comment:                Men          Women1/2 Average Risk     3.4          3.3Average Risk          5.0          4.42X Average Risk          9.6          7.13X Average Risk          15.0          11.0                       NonHDL 55.37     Comment: NOTE:  Non-HDL goal should be 30 mg/dL higher than patient's LDL goal (i.e. LDL goal of < 70 mg/dL, would have non-HDL goal of < 100 mg/dL)  Hemoglobin A1c     Status: None   Collection Time: 09/06/20 10:22 AM  Result Value Ref Range   Hgb A1c MFr Bld 5.8 4.6 - 6.5 %    Comment: Glycemic Control Guidelines for People with Diabetes:Non Diabetic:  <6%Goal of Therapy: <7%Additional Action Suggested:  >8%   PSA, Medicare     Status: None   Collection Time: 09/06/20 10:22 AM  Result Value Ref Range   PSA 1.10 0.10 - 4.00 ng/ml    Comment: Test performed using Access Hybritech PSA Assay, a parmagnetic partical, chemiluminecent immunoassay.  Comprehensive metabolic panel     Status: Abnormal   Collection Time: 11/08/20  9:36 AM  Result Value Ref Range   Sodium 135 135 - 145 mmol/L   Potassium 4.1 3.5 - 5.1 mmol/L   Chloride 102 98 - 111 mmol/L   CO2 21 (L) 22 - 32 mmol/L   Glucose, Bld 130 (H) 70 - 99 mg/dL    Comment: Glucose reference range applies only to samples taken after fasting for at least 8 hours.   BUN 26 (H) 8 - 23 mg/dL    Creatinine, Ser 2.15 (H) 0.61 - 1.24 mg/dL   Calcium 9.5 8.9 - 10.3 mg/dL   Total Protein 7.0 6.5 - 8.1 g/dL   Albumin 3.7 3.5 - 5.0 g/dL   AST 21 15 - 41 U/L   ALT 7 0 - 44 U/L   Alkaline Phosphatase 71 38 - 126 U/L   Total Bilirubin 1.5 (H) 0.3 - 1.2 mg/dL   GFR, Estimated 31 (L) >60 mL/min    Comment: (NOTE) Calculated using the CKD-EPI Creatinine Equation (2021)    Anion gap 12 5 - 15    Comment: Performed at [facility] [address], [place], [place] [address]  CBC with Differential     Status: Abnormal   Collection Time: 11/08/20  9:36 AM  Result Value Ref Range   WBC 5.5 4.0 -  10.5 K/uL   RBC 3.54 (L) 4.22 - 5.81 MIL/uL   Hemoglobin 11.9 (L) 13.0 - 17.0 g/dL   HCT 35.8 (L) 39.0 - 52.0 %   MCV 101.1 (H) 80.0 - 100.0 fL   MCH 33.6 26.0 - 34.0 pg   MCHC 33.2 30.0 - 36.0 g/dL   RDW 12.2 11.5 - 15.5 %   Platelets 157 150 - 400 K/uL   nRBC 0.0 0.0 - 0.2 %   Neutrophils Relative % 76 %   Neutro Abs 4.2 1.7 - 7.7 K/uL   Lymphocytes Relative 16 %   Lymphs Abs 0.9 0.7 - 4.0 K/uL   Monocytes Relative 6 %   Monocytes Absolute 0.4 0.1 - 1.0 K/uL   Eosinophils Relative 1 %   Eosinophils Absolute 0.0 0.0 - 0.5 K/uL   Basophils Relative 0 %   Basophils Absolute 0.0 0.0 - 0.1 K/uL   Immature Granulocytes 1 %   Abs Immature Granulocytes 0.03 0.00 - 0.07 K/uL    Comment: Performed at [facility] [address], [place], [place] [address]  Urinalysis, Routine w reflex microscopic Urine, Clean Catch     Status: Abnormal   Collection Time: 11/08/20 10:01 AM  Result Value Ref Range   Color, Urine AMBER (A) YELLOW    Comment: BIOCHEMICALS MAY BE AFFECTED BY COLOR   APPearance CLEAR CLEAR   Specific Gravity, Urine 1.019 1.005 - 1.030   pH 5.0 5.0 - 8.0   Glucose, UA NEGATIVE NEGATIVE mg/dL   Hgb urine dipstick NEGATIVE NEGATIVE   Bilirubin Urine NEGATIVE NEGATIVE   Ketones, ur 20 (A) NEGATIVE mg/dL   Protein, ur 100 (A) NEGATIVE mg/dL   Nitrite NEGATIVE NEGATIVE    Leukocytes,Ua SMALL (A) NEGATIVE   RBC / HPF 0-5 0 - 5 RBC/hpf   WBC, UA 21-50 0 - 5 WBC/hpf   Bacteria, UA FEW (A) NONE SEEN   Squamous Epithelial / LPF 0-5 0 - 5    Comment: Performed at [facility], [place] [address], [place], [place] [address]  Vitamin B12     Status: None   Collection Time: 11/08/20  1:28 PM  Result Value Ref Range   Vitamin B-12 537 180 - 914 pg/mL    Comment: (NOTE) This assay is not validated for testing neonatal or myeloproliferative syndrome specimens for Vitamin B12 levels. Performed at [facility], [place] [address], [place], [place] [address]   Urine culture     Status: None   Collection Time: 11/08/20  2:08 PM   Specimen: Urine, Clean Catch  Result Value Ref Range   Specimen Description URINE, CLEAN CATCH    Special Requests NONE    Culture      NO GROWTH Performed at [facility], [place] [address], [place], [place] [address]    Report Status 11/09/2020 FINAL    Objective  Body mass index is 29.92 kg/m. Wt Readings from Last 3 Encounters:  11/13/20 185 lb 6.4 oz (84.1 kg)  11/08/20 185 lb (83.9 kg)  10/30/20 188 lb (85.3 kg)   Temp Readings from Last 3 Encounters:  11/13/20 97.9 F (36.6 C) (Oral)  11/08/20 98.4 F (36.9 C) (Oral)  10/10/20 97.7 F (36.5 C) (Temporal)   BP Readings from Last 3 Encounters:  11/13/20 (!) 146/64  11/08/20 (!) 178/52  10/30/20 140/62   Pulse Readings from Last 3 Encounters:  11/13/20 (!) 52  11/08/20 (!) 53  10/30/20 (!) 51  Physical Exam Vitals and nursing note reviewed.  Constitutional:      Appearance: Normal appearance. He is well-developed and well-groomed.  HENT:     Head: Normocephalic and atraumatic.  Eyes:     Conjunctiva/sclera: Conjunctivae normal.     Pupils: Pupils are equal, round, and reactive to light.  Cardiovascular:     Rate and Rhythm: Normal rate and regular rhythm.     Heart sounds: Normal heart sounds. No murmur heard.   Pulmonary:     Effort:  Pulmonary effort is normal.     Breath sounds: Normal breath sounds.  Skin:    General: Skin is warm and dry.  Neurological:     General: No focal deficit present.     Mental Status: He is alert and oriented to person, place, and time. Mental status is at baseline.     Motor: No weakness.     Gait: Gait abnormal.     Comments: Using rollator today Monofilament feels more on right lower leg and left upper arm than left lower leg and right upper arm   Psychiatric:        Attention and Perception: Attention and perception normal.        Mood and Affect: Mood and affect normal.        Speech: Speech normal.        Behavior: Behavior normal. Behavior is cooperative.        Thought Content: Thought content normal.        Cognition and Memory: Cognition and memory normal.        Judgment: Judgment normal.     Assessment  Plan  Weakness of both lower extremities - Plan: MR Lumbar Spine Wo Contrast, Ambulatory referral to Physical Therapy, NCV with EMG(electromyography) Dr. Posey Pronto, Ambulatory referral to Neurology Dr. Carles Collet   Arthritis of lumbar spine - Plan: MR Lumbar Spine Wo Contrast  Mid back pain - Plan: DG Thoracic Spine 2 View  Weight loss - Plan: CT Abdomen Pelvis Wo Contrast  Loss of appetite - Plan: CT Abdomen Pelvis Wo Contrast Nausea - Plan: CT Abdomen Pelvis Wo Contrast  Abnormal gait - Plan: Ambulatory referral to Physical Therapy, NCV with EMG(electromyography), Ambulatory referral to Neurology Parkinson disease [facility]) - Plan: Ambulatory referral to Neurology  Using rollator and referred PT [facility]   Given handicap dmv sticker form today   Provider: Dr. Olivia Mackie McLean-Scocuzza-Internal Medicine

## 2020-11-13 NOTE — Patient Instructions (Addendum)
Weakness Weakness is a lack of strength. You may feel weak all over your body (generalized), or you may feel weak in one specific part of your body (focal). Common causes of weakness include:  Infection and immune system disorders.  Physical exhaustion.  Internal bleeding or other blood loss that results in a lack of red blood cells (anemia).  Dehydration.  An imbalance in mineral (electrolyte) levels, such as potassium.  Heart disease, circulation problems, or stroke. Other causes include:  Some medicines or cancer treatment.  Stress, anxiety, or depression.  Nervous system disorders.  Thyroid disorders.  Loss of muscle strength because of age or inactivity.  Poor sleep quality or sleep disorders. The cause of your weakness may not be known. Some causes of weakness can be serious, so it is important to see your health care provider. Follow these instructions at home: Activity  Rest as needed.  Try to get enough sleep. Most adults need 7-8 hours of quality sleep each night. Talk to your health care provider about how much sleep you need each night.  Do exercises, such as arm curls and leg raises, for 30 minutes at least 2 days a week or as told by your health care provider. This helps build muscle strength.  Consider working with a physical therapist or trainer who can develop an exercise plan to help you gain muscle strength. General instructions  Take over-the-counter and prescription medicines only as told by your health care provider.  Eat a healthy, well-balanced diet. This includes: ? Proteins to build muscles, such as lean meats and fish. ? Fresh fruits and vegetables. ? Carbohydrates to boost energy, such as whole grains.  Drink enough fluid to keep your urine pale yellow.  Keep all follow-up visits as told by your health care provider. This is important.   Contact a health care provider if your weakness:  Does not improve or gets worse.  Affects your  ability to think clearly.  Affects your ability to do your normal daily activities. Get help right away if you:  Develop sudden weakness, especially on one side of your face or body.  Have chest pain.  Have trouble breathing or shortness of breath.  Have problems with your vision.  Have trouble talking or swallowing.  Have trouble standing or walking.  Are light-headed or lose consciousness. Summary  Weakness is a lack of strength. You may feel weak all over your body or just in one specific part of your body.  Weakness can be caused by a variety of things. In some cases, the cause may be unknown.  Rest as needed, and try to get enough sleep. Most adults need 7-8 hours of quality sleep each night.  Eat a healthy, well-balanced diet. This information is not intended to replace advice given to you by your health care provider. Make sure you discuss any questions you have with your health care provider. Document Revised: 12/30/2017 Document Reviewed: 12/30/2017 Elsevier Patient Education  2021 [place] for Chronic Kidney Disease Chronic kidney disease (CKD) occurs when the kidneys are permanently damaged over a long period of time. When your kidneys are not working well, they cannot remove waste, fluids, and other substances from your blood as well as they did before. The substances can build up, which can worsen kidney damage and affect how your body functions. Certain foods lead to a buildup of these substances. By changing your diet, you can help prevent more kidney damage and delay or prevent the  need for dialysis. What are tips for following this plan? Reading food labels  Check the amount of salt (sodium) in foods. Choose foods that have less than 300 milligrams (mg) per serving.  Check the ingredient list for phosphorus or potassium-based additives or preservatives.  Check the amount of saturated fat and trans fat. Limit or avoid these fats as told by  your dietitian. Shopping  Avoid buying foods that are: ? Processed or prepackaged. ? Calcium-enriched or that have calcium added to them (are fortified).  Do not buy foods that have salt or sodium listed among the first five ingredients.  Buy canned vegetables and beans that say "no salt added" or "low sodium" and rinse them before eating. Cooking  Soak vegetables, such as potatoes, before cooking to reduce potassium. To do this: 1. Peel and cut the vegetables into small pieces. 2. Soak the vegetables in warm water for at least 2 hours. For every 1 cup of vegetables, use 10 cups of water. 3. Drain and rinse the vegetables with warm water. 4. Boil the vegetables for at least 5 minutes. Meal planning  Limit the amount of protein you eat from plant and animal sources each day.  Do not add salt to food when cooking or before eating.  Eat meals and snacks at around the same time each day. General information  Talk with your health care provider about whether you should take a vitamin and mineral supplement.  Use standard measuring cups and spoons to measure servings of foods. Use a kitchen scale to measure portions of protein foods.  If told by your health care provider, avoid drinking too much fluid. Measure and count all liquids, including water, ice, soups, flavored gelatin, and frozen desserts such as ice pops or ice cream. If you have diabetes:  If you have diabetes (diabetes mellitus) and CKD, it is important to keep your blood sugar (glucose) in the target range recommended by your health care provider. Follow your diabetes management plan. This may include: ? Checking your blood glucose regularly. ? Taking medicines by mouth, taking insulin, or taking both. ? Exercising for at least 30 minutes on 5 or more days each week, or as told by your health care provider. ? Tracking how many servings of carbohydrates you eat at each meal.  You may be given specific guidelines on how  much of certain foods and nutrients you may eat, depending on your stage of kidney disease and whether you have high blood pressure (hypertension). Follow your meal plan as told by your dietitian. What nutrients should I limit? Work with your health care provider and dietitian to develop a meal plan that is right for you. Foods you can eat and foods you should limit or avoid will depend on the stage of your kidney disease and any other health conditions you have. The items listed below are not a complete list. Talk with your dietitian about what dietary choices are best for you. Potassium Potassium affects how steadily your heart beats. If too much potassium builds up in your blood, the potassium can cause an irregular heartbeat or even a heart attack. You may need to limit or avoid foods that are high in potassium, such as:  Milk and soy milk.  Fruits, such as bananas, apricots, nectarines, melon, prunes, raisins, kiwi, and oranges.  Vegetables, such as potatoes, sweet potatoes, yams, tomatoes, leafy greens, beets, avocado, pumpkin, and winter squash.  White and lima beans.  Whole-wheat breads and pastas.  Beans and  nuts. Phosphorus Phosphorus is a mineral found in your bones. A balance between calcium and phosphorus is needed to build and maintain healthy bones. Too much phosphorus pulls calcium from your bones. This can make your bones weak and more likely to break. Too much phosphorus can also make your skin itch. You may need to limit or avoid foods that are high in phosphorus, such as:  Milk and dairy products.  Dried beans and peas.  Tofu, soy milk, and other soy-based meat replacements.  Dark-colored sodas.  Nuts and peanut butter.  Meat, poultry, and fish.  Bran cereals and oatmeal. Protein Protein helps you make and keep muscle. It also helps to repair your body's cells and tissues. One of the natural breakdown products of protein is a waste product called urea. When  your kidneys are not working properly, they cannot remove wastes, such as urea. Reducing how much protein you eat can help prevent a buildup of urea in your blood. Depending on your stage of kidney disease, you may need to limit foods that are high in protein. Sources of animal protein include:  Meat (all types).  Fish and seafood.  Poultry.  Eggs.  Dairy. Other protein foods include:  Beans and legumes.  Nuts and nut butter.  Soy and tofu.   Sodium Sodium helps to maintain a healthy balance of fluids in your body. Too much sodium can increase your blood pressure and have a negative effect on your heart and lungs. Too much sodium can also cause your body to retain too much fluid, making your kidneys work harder. Most people should have less than 2,300 mg of sodium each day. If you have hypertension, you may need to limit your sodium to 1,500 mg each day. You may need to limit or avoid foods that are high in sodium, such as:  Salt seasonings.  Soy sauce.  Cured and processed meats.  Salted crackers and snack foods.  Fast food.  Canned soups and most canned foods.  Pickled foods.  Vegetable juice.  Boxed mixes or ready-to-eat boxed meals and side dishes.  Bottled dressings, sauces, and marinades. Talk with your dietitian about how much potassium, phosphorus, protein, and sodium you may have each day. Summary  Chronic kidney disease (CKD) can lead to a buildup of waste and extra substances in the body. Certain foods lead to a buildup of these substances. By changing your diet as told, you can help prevent more kidney damage and delay or prevent the need for dialysis.  Food intake changes are different for each person with CKD. Work with a dietitian to set up nutrient goals and a meal plan that is right for you.  If you have diabetes and CKD, it is important to keep your blood sugar in the target range recommended by your health care provider. This information is not  intended to replace advice given to you by your health care provider. Make sure you discuss any questions you have with your health care provider. Document Revised: 09/19/2019 Document Reviewed: 09/19/2019 Elsevier Patient Education  2021 [place].  Chronic Kidney Disease, Adult Chronic kidney disease (CKD) occurs when the kidneys are slowly and permanently damaged over a long period of time. The kidneys are a pair of organs that do many important jobs in the body, including:  Removing waste and extra fluid from the blood to make urine.  Making hormones that maintain the amount of fluid in tissues and blood vessels.  Maintaining the right amount of fluids  and chemicals in the body. A small amount of kidney damage may not cause problems, but a large amount of damage may make it hard or impossible for the kidneys to work right. Steps must be taken to slow kidney damage or to stop it from getting worse. If steps are not taken, the kidneys may stop working permanently (end-stage renal disease, or ESRD). Most of the time, CKD does not go away, but it can often be controlled. People who have CKD are usually able to live full lives. What are the causes? The most common causes of this condition are diabetes and high blood pressure (hypertension). Other causes include:  Cardiovascular diseases. These affect the heart and blood vessels.  Kidney diseases. These include: ? Glomerulonephritis, or inflammation of the tiny filters in the kidneys. ? Interstitial nephritis. This is swelling of the small tubes of the kidneys and of the surrounding structures. ? Polycystic kidney disease, in which clusters of fluid-filled sacs form within the kidneys. ? Renal vascular disease. This includes disorders that affect the arteries and veins of the kidneys.  Diseases that affect the body's defense system (immune system).  A problem with urine flow. This may be caused by: ? Kidney stones. ? Cancer. ? An  enlarged prostate, in males.  A kidney infection or urinary tract infection (UTI) that keeps coming back.  Vasculitis. This is swelling or inflammation of the blood vessels. What increases the risk? Your chances of having kidney disease increase with age. The following factors may make you more likely to develop this condition:  A family history of kidney disease or kidney failure. Kidney failure means the kidneys can no longer work right.  Certain genetic diseases.  Taking medicines often that are damaging to the kidneys.  Being around or being in contact with toxic substances.  Obesity.  A history of tobacco use. What are the signs or symptoms? Symptoms of this condition include:  Feeling very tired (lethargic) and having less energy.  Swelling, or edema, of the face, legs, ankles, or feet.  Nausea or vomiting, or loss of appetite.  Confusion or trouble concentrating.  Muscle twitches and cramps, especially in the legs.  Dry, itchy skin.  A metallic taste in the mouth.  Producing less urine, or producing more urine (especially at night).  Shortness of breath.  Trouble sleeping. CKD may also result in not having enough red blood cells or hemoglobin in the blood (anemia) or having weak bones (bone disease). Symptoms develop slowly and may not be obvious until the kidney damage becomes severe. It is possible to have kidney disease for years without having symptoms. How is this diagnosed? This condition may be diagnosed based on:  Blood tests.  Urine tests.  Imaging tests, such as an ultrasound or a CT scan.  A kidney biopsy. This involves removing a sample of kidney tissue to be looked at under a microscope. Results from these tests will help to determine how serious the CKD is. How is this treated? There is no cure for most cases of this condition, but treatment usually relieves symptoms and prevents or slows the worsening of the disease. Treatment may  include:  Diet changes, which may require you to avoid alcohol and foods that are high in salt, potassium, phosphorous, and protein.  Medicines. These may: ? Lower blood pressure. ? Control blood sugar (glucose). ? Relieve anemia. ? Relieve swelling. ? Protect your bones. ? Improve the balance of salts and minerals in your blood (electrolytes).  Dialysis,  which is a type of treatment that removes toxic waste from the body. It may be needed if you have kidney failure.  Managing any other conditions that are causing your CKD or making it worse. Follow these instructions at home: Medicines  Take over-the-counter and prescription medicines only as told by your health care provider. The amount of some medicines that you take may need to be changed.  Do not take any new medicines unless approved by your health care provider. Many medicines can make kidney damage worse.  Do not take any vitamin and mineral supplements unless approved by your health care provider. Many nutritional supplements can make kidney damage worse. Lifestyle  Do not use any products that contain nicotine or tobacco, such as cigarettes, e-cigarettes, and chewing tobacco. If you need help quitting, ask your health care provider.  If you drink alcohol: ? Limit how much you use to:  0-1 drink a day for women who are not pregnant.  0-2 drinks a day for men. ? Know how much alcohol is in your drink. In the [place] one drink equals one 12 oz bottle of beer (355 mL), one 5 oz glass of wine (148 mL), or one 1 oz glass of hard liquor (44 mL).  Maintain a healthy weight. If you need help, ask your health care provider.   General instructions  Follow instructions from your health care provider about eating or drinking restrictions, including any prescribed diet.  Track your blood pressure at home. Report changes in your blood pressure as told.  If you are being treated for diabetes, track your blood glucose levels as  told.  Start or continue an exercise plan. Exercise at least 30 minutes a day, 5 days a week.  Keep your immunizations up to date as told.  Keep all follow-up visits. This is important.   Where to find more information  American Association of Kidney Patients: BombTimer.gl  National Kidney Foundation: www.kidney.[place]: https://mathis.com/  Life Options: www.lifeoptions.org  Kidney [employer]: www.kidneyschool.org Contact a health care provider if:  Your symptoms get worse.  You develop new symptoms. Get help right away if:  You develop symptoms of ESRD. These include: ? Headaches. ? Numbness in your hands or feet. ? Easy bruising. ? Frequent hiccups. ? Chest pain. ? Shortness of breath. ? Lack of menstrual periods, in women.  You have a fever.  You are producing less urine than usual.  You have pain or bleeding when you urinate or when you have a bowel movement. These symptoms may represent a serious problem that is an emergency. Do not wait to see if the symptoms will go away. Get medical help right away. Call your local emergency services (911 in the [place] Do not drive yourself to the hospital. Summary  Chronic kidney disease (CKD) occurs when the kidneys become damaged slowly over a long period of time.  The most common causes of this condition are diabetes and high blood pressure (hypertension).  There is no cure for most cases of CKD, but treatment usually relieves symptoms and prevents or slows the worsening of the disease. Treatment may include a combination of lifestyle changes, medicines, and dialysis. This information is not intended to replace advice given to you by your health care provider. Make sure you discuss any questions you have with your health care provider. Document Revised: 08/31/2019 Document Reviewed: 08/31/2019 Elsevier Patient Education  [address].

## 2020-11-13 NOTE — Telephone Encounter (Signed)
Called and spoke to patient and asked patient is he has weakness on one side of body or numbness more on one side of body? Patient stated that he does not have no numbness or weakness.    Also, asked patient about control of bladder and bowel (make sure no changes or NEW loss of control)? Patient stated that he has no changes with his bowel or bladder and everything has been the same.    Informed patient that we will work him in Monday but if weaker/new symptoms, go back to the ER. Patient verbalized understanding and will go to the ER if anything changes. Patient is aware that someone will be calling him to work him in Dr. Doristine Devoid schedule.

## 2020-11-14 NOTE — Telephone Encounter (Signed)
Dr. Gonzalez, please advise. thanks 

## 2020-11-14 NOTE — Telephone Encounter (Signed)
The images that the abdominal and pelvis CT offer are incomplete and would not show all the images of the lung.  Both tests can be done at the same time and it would actually be less radiation if he does at all together.

## 2020-11-14 NOTE — Telephone Encounter (Signed)
I have called the patient several times and left messages for him with the date and time of the appt with Dr Tat. I have also asked him to call us back to schedule the EMG that his PCP wants done.

## 2020-11-15 ENCOUNTER — Other Ambulatory Visit: Payer: Medicare Other

## 2020-11-15 ENCOUNTER — Other Ambulatory Visit: Payer: Self-pay

## 2020-11-15 ENCOUNTER — Ambulatory Visit
Admission: RE | Admit: 2020-11-15 | Discharge: 2020-11-15 | Disposition: A | Discharge status: Home / Self Care | Payer: Medicare Other | Source: Ambulatory Visit | Attending: Internal Medicine | Admitting: Internal Medicine

## 2020-11-15 ENCOUNTER — Telehealth: Payer: Self-pay

## 2020-11-15 DIAGNOSIS — R29898 Other symptoms and signs involving the musculoskeletal system: Secondary | ICD-10-CM | POA: Diagnosis not present

## 2020-11-15 DIAGNOSIS — M545 Low back pain, unspecified: Secondary | ICD-10-CM | POA: Diagnosis not present

## 2020-11-15 DIAGNOSIS — M47816 Spondylosis without myelopathy or radiculopathy, lumbar region: Secondary | ICD-10-CM | POA: Insufficient documentation

## 2020-11-15 NOTE — Addendum Note (Signed)
Addended by: Orland Mustard on: 11/15/2020 04:54 PM   Modules accepted: Orders

## 2020-11-15 NOTE — Telephone Encounter (Signed)
?   Pneumonia on Xray back want him to get Chest Xray [facility] and be tested for covid at [facility]

## 2020-11-16 ENCOUNTER — Ambulatory Visit
Admission: RE | Admit: 2020-11-16 | Discharge: 2020-11-16 | Disposition: A | Discharge status: Home / Self Care | Payer: Medicare Other | Attending: Internal Medicine | Admitting: Internal Medicine

## 2020-11-16 ENCOUNTER — Ambulatory Visit
Admission: RE | Admit: 2020-11-16 | Discharge: 2020-11-16 | Disposition: A | Discharge status: Home / Self Care | Payer: Medicare Other | Source: Ambulatory Visit | Attending: Internal Medicine | Admitting: Internal Medicine

## 2020-11-16 DIAGNOSIS — J189 Pneumonia, unspecified organism: Secondary | ICD-10-CM | POA: Insufficient documentation

## 2020-11-16 DIAGNOSIS — Z03818 Encounter for observation for suspected exposure to other biological agents ruled out: Secondary | ICD-10-CM | POA: Diagnosis not present

## 2020-11-16 DIAGNOSIS — Z20822 Contact with and (suspected) exposure to covid-19: Secondary | ICD-10-CM | POA: Diagnosis not present

## 2020-11-16 DIAGNOSIS — J90 Pleural effusion, not elsewhere classified: Secondary | ICD-10-CM | POA: Diagnosis not present

## 2020-11-16 NOTE — Telephone Encounter (Signed)
Please go for CXR now and covid 19 testing trying to see if has pneumonia now as if he does we need to treat at this could cause weakness  [facility] or [facility] he can schedule and call for covid 19 testing  We cant wait until 6/14

## 2020-11-16 NOTE — Telephone Encounter (Signed)
LMTCB

## 2020-11-16 NOTE — Telephone Encounter (Signed)
Patient aware and is going to do cxr at [facility] and [facility] for COVID test

## 2020-11-16 NOTE — Telephone Encounter (Signed)
Patient stated there has been a spot on his lung that they are following and Dr Patsey Berthold has him scheduled for a chest CT on 6/14. Wanted to know if you are ok with him just waiting for this scan instead of doing xray?

## 2020-11-19 ENCOUNTER — Ambulatory Visit: Payer: Medicare Other | Admitting: Neurology

## 2020-11-20 ENCOUNTER — Telehealth: Payer: Self-pay | Admitting: Internal Medicine

## 2020-11-20 ENCOUNTER — Other Ambulatory Visit: Payer: Self-pay | Admitting: Internal Medicine

## 2020-11-20 ENCOUNTER — Other Ambulatory Visit: Payer: Self-pay

## 2020-11-20 ENCOUNTER — Ambulatory Visit
Admission: RE | Admit: 2020-11-20 | Discharge: 2020-11-20 | Disposition: A | Discharge status: Home / Self Care | Payer: Medicare Other | Source: Ambulatory Visit | Attending: Pulmonary Disease | Admitting: Pulmonary Disease

## 2020-11-20 ENCOUNTER — Telehealth: Payer: Self-pay | Admitting: Neurology

## 2020-11-20 DIAGNOSIS — R634 Abnormal weight loss: Secondary | ICD-10-CM

## 2020-11-20 DIAGNOSIS — R110 Nausea: Secondary | ICD-10-CM

## 2020-11-20 DIAGNOSIS — R630 Anorexia: Secondary | ICD-10-CM

## 2020-11-20 DIAGNOSIS — R911 Solitary pulmonary nodule: Secondary | ICD-10-CM

## 2020-11-20 DIAGNOSIS — J690 Pneumonitis due to inhalation of food and vomit: Secondary | ICD-10-CM

## 2020-11-20 DIAGNOSIS — J189 Pneumonia, unspecified organism: Secondary | ICD-10-CM

## 2020-11-20 MED ORDER — LEVOFLOXACIN 750 MG PO TABS: 750.0000 mg | ORAL_TABLET | ORAL | 0 refills | Status: DC

## 2020-11-20 MED ORDER — AMOXICILLIN-POT CLAVULANATE 875-125 MG PO TABS: 1.0000 | ORAL_TABLET | Freq: Two times a day (BID) | ORAL | 0 refills | Status: DC

## 2020-11-20 NOTE — Telephone Encounter (Signed)
Just note to document that patient no showed appt yesterday requested as work in by  Dr. Olivia Mackie McLean-Scocuzza.  However, looks like pt dx with LLL pneumonia and likely source of weakness was found as metabolic and not neurologic.

## 2020-11-20 NOTE — Telephone Encounter (Signed)
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

## 2020-11-20 NOTE — Telephone Encounter (Signed)
Abnormal CXR sent levaquin 750 mg every other day x 5 doses   He has trouble with chewing or swallowing foods wrong way  Had dry hackey cough x 2-3 weeks  F/u with pulm Dr. Patsey Arnold   cT chest/ab/pelvis sch 11/21/20

## 2020-11-21 ENCOUNTER — Ambulatory Visit
Admission: RE | Admit: 2020-11-21 | Discharge: 2020-11-21 | Disposition: A | Discharge status: Home / Self Care | Payer: Medicare Other | Source: Ambulatory Visit | Attending: Pulmonary Disease | Admitting: Pulmonary Disease

## 2020-11-21 ENCOUNTER — Telehealth: Payer: Self-pay | Admitting: Pulmonary Disease

## 2020-11-21 DIAGNOSIS — I771 Stricture of artery: Secondary | ICD-10-CM | POA: Diagnosis not present

## 2020-11-21 DIAGNOSIS — R630 Anorexia: Secondary | ICD-10-CM | POA: Insufficient documentation

## 2020-11-21 DIAGNOSIS — J90 Pleural effusion, not elsewhere classified: Secondary | ICD-10-CM | POA: Diagnosis not present

## 2020-11-21 DIAGNOSIS — R911 Solitary pulmonary nodule: Secondary | ICD-10-CM | POA: Diagnosis not present

## 2020-11-21 DIAGNOSIS — R634 Abnormal weight loss: Secondary | ICD-10-CM | POA: Insufficient documentation

## 2020-11-21 DIAGNOSIS — K8020 Calculus of gallbladder without cholecystitis without obstruction: Secondary | ICD-10-CM | POA: Diagnosis not present

## 2020-11-21 DIAGNOSIS — K5750 Diverticulosis of both small and large intestine without perforation or abscess without bleeding: Secondary | ICD-10-CM | POA: Diagnosis not present

## 2020-11-21 DIAGNOSIS — I2510 Atherosclerotic heart disease of native coronary artery without angina pectoris: Secondary | ICD-10-CM | POA: Diagnosis not present

## 2020-11-21 DIAGNOSIS — R110 Nausea: Secondary | ICD-10-CM | POA: Insufficient documentation

## 2020-11-21 DIAGNOSIS — J181 Lobar pneumonia, unspecified organism: Secondary | ICD-10-CM | POA: Diagnosis not present

## 2020-11-21 DIAGNOSIS — K828 Other specified diseases of gallbladder: Secondary | ICD-10-CM | POA: Diagnosis not present

## 2020-11-21 NOTE — Telephone Encounter (Signed)
As discussed

## 2020-11-21 NOTE — Telephone Encounter (Signed)
Patient stated that he was dx with PNA. Started Augmentin 875mg  today that was prescribed by PCP.  PCP recommended that he be seen by our office within the next week.  He reports of non prod cough, weakness and just feels unwell overall. Sx started 3-4 weeks ago.  Flu vaccinated against covid and flu.  Dr. Patsey Berthold, please advise. thanks

## 2020-11-21 NOTE — Telephone Encounter (Signed)
Per Dr. Patsey Berthold verbally- open 10:00 slot on 11/23/20. Email has been sent to [facility] to do so.   Patient is aware of date/time of appt.   Will leave encounter open to ensure f/u.

## 2020-11-21 NOTE — Telephone Encounter (Signed)
Noted, Patient spoke with Dr Olivia Mackie McLean-Scocuzza last night.

## 2020-11-23 ENCOUNTER — Other Ambulatory Visit: Payer: Self-pay | Admitting: Neurology

## 2020-11-23 ENCOUNTER — Ambulatory Visit (INDEPENDENT_AMBULATORY_CARE_PROVIDER_SITE_OTHER): Payer: Medicare Other | Admitting: Pulmonary Disease

## 2020-11-23 ENCOUNTER — Other Ambulatory Visit
Admission: RE | Admit: 2020-11-23 | Discharge: 2020-11-23 | Disposition: A | Discharge status: Home / Self Care | Payer: Medicare Other | Source: Ambulatory Visit | Attending: Pulmonary Disease | Admitting: Pulmonary Disease

## 2020-11-23 ENCOUNTER — Encounter: Payer: Self-pay | Admitting: Pulmonary Disease

## 2020-11-23 ENCOUNTER — Other Ambulatory Visit: Payer: Self-pay

## 2020-11-23 VITALS — BP 110/40 | HR 63 | Temp 97.1°F | Ht 66.0 in | Wt 172.2 lb

## 2020-11-23 DIAGNOSIS — G20 Parkinson's disease: Secondary | ICD-10-CM

## 2020-11-23 DIAGNOSIS — J690 Pneumonitis due to inhalation of food and vomit: Secondary | ICD-10-CM | POA: Diagnosis not present

## 2020-11-23 DIAGNOSIS — G4730 Sleep apnea, unspecified: Secondary | ICD-10-CM | POA: Diagnosis not present

## 2020-11-23 DIAGNOSIS — R911 Solitary pulmonary nodule: Secondary | ICD-10-CM

## 2020-11-23 DIAGNOSIS — R531 Weakness: Secondary | ICD-10-CM | POA: Diagnosis not present

## 2020-11-23 LAB — CBC WITH DIFFERENTIAL/PLATELET
Abs Immature Granulocytes: 0.03 10*3/uL (ref 0.00–0.07)
Basophils Absolute: 0 10*3/uL (ref 0.0–0.1)
Basophils Relative: 0 %
Eosinophils Absolute: 0.1 10*3/uL (ref 0.0–0.5)
Eosinophils Relative: 1 %
HCT: 31.3 % — ABNORMAL LOW (ref 39.0–52.0)
Hemoglobin: 10.4 g/dL — ABNORMAL LOW (ref 13.0–17.0)
Immature Granulocytes: 0 %
Lymphocytes Relative: 18 %
Lymphs Abs: 1.2 10*3/uL (ref 0.7–4.0)
MCH: 33 pg (ref 26.0–34.0)
MCHC: 33.2 g/dL (ref 30.0–36.0)
MCV: 99.4 fL (ref 80.0–100.0)
Monocytes Absolute: 0.3 10*3/uL (ref 0.1–1.0)
Monocytes Relative: 4 %
Neutro Abs: 5.1 10*3/uL (ref 1.7–7.7)
Neutrophils Relative %: 77 %
Platelets: 326 10*3/uL (ref 150–400)
RBC: 3.15 MIL/uL — ABNORMAL LOW (ref 4.22–5.81)
RDW: 12.7 % (ref 11.5–15.5)
WBC: 6.8 10*3/uL (ref 4.0–10.5)
nRBC: 0 % (ref 0.0–0.2)

## 2020-11-23 LAB — TSH: TSH: 1.241 u[IU]/mL (ref 0.350–4.500)

## 2020-11-23 LAB — RENAL FUNCTION PANEL
Albumin: 3 g/dL — ABNORMAL LOW (ref 3.5–5.0)
Anion gap: 9 (ref 5–15)
BUN: 25 mg/dL — ABNORMAL HIGH (ref 8–23)
CO2: 24 mmol/L (ref 22–32)
Calcium: 8.7 mg/dL — ABNORMAL LOW (ref 8.9–10.3)
Chloride: 99 mmol/L (ref 98–111)
Creatinine, Ser: 2.1 mg/dL — ABNORMAL HIGH (ref 0.61–1.24)
GFR, Estimated: 32 mL/min — ABNORMAL LOW (ref >60)
Glucose, Bld: 157 mg/dL — ABNORMAL HIGH (ref 70–99)
Phosphorus: 4.3 mg/dL (ref 2.5–4.6)
Potassium: 4.5 mmol/L (ref 3.5–5.1)
Sodium: 132 mmol/L — ABNORMAL LOW (ref 135–145)

## 2020-11-23 LAB — T4, FREE: Free T4: 1.6 ng/dL — ABNORMAL HIGH (ref 0.61–1.12)

## 2020-11-23 LAB — CORTISOL: Cortisol, Plasma: 23 ug/dL

## 2020-11-23 MED ORDER — CLONAZEPAM 0.5 MG PO TABS: ORAL_TABLET | ORAL | 0 refills | Status: DC

## 2020-11-23 MED ORDER — PREDNISONE 20 MG PO TABS: 20.0000 mg | ORAL_TABLET | Freq: Every day | ORAL | 0 refills | Status: AC

## 2020-11-23 MED ORDER — AZITHROMYCIN 250 MG PO TABS: ORAL_TABLET | ORAL | 0 refills | Status: DC

## 2020-11-23 NOTE — Patient Instructions (Signed)
You have to go to the lab today to get some blood work done  We have put in a request for a chest x-ray for either Monday or Tuesday you can come at your convenience to get that done  I have added an antibiotic and some prednisone for you to take  We will see you in follow-up in 2 to 3 weeks time with either me or the nurse practitioner

## 2020-11-23 NOTE — Progress Notes (Signed)
Subjective:    Patient ID: Johnathan Arnold., male    DOB: 1946-03-09, 75 y.o.   MRN: [id number]  CC: Pneumonia, work-in visit  HPI Patient is a 75 year old lifelong never smoker with a history of Parkinson's disease who has been following here for the issue of LEFT lower lobe 11 mm elevated nodule/cluster of nodules.  He  was last seen here on 10 Oct 2020 that was a routine visit in which we followed up on the issues with OSA and a lung nodule.  CT of the chest was requested at that time to follow-up on his prior one.  Since that visit however, developing some issues.  The first issue was that of lower extremity weakness for which he was evaluated at the emergency room she now has made him dependent on a walker which he was not dependent previously.  Work-up in the emergency room was essentially noncontributory.  On 7 June the patient followed up by primary care and MRI of the lumbar spine was requested.  In the process also the patient had the chest CT scan previously requested and an abdominal and pelvic CT.  The chest CT showed evidence of aspiration pneumonia with pneumonia on both lower lobes left greater than right.  Has a small pleural effusion on the left.  He is currently on Augmentin 875 twice daily.  The patient does report that he has had a dry hacking cough for approximately 2 to 3 weeks prior to this visit.  He also does report that things go "the wrong way" from time to time.  He did try to minimize the symptom however his wife is present and she does state that he gets choked frequently.  Able to use CPAP over the last 2 to 3 weeks due to his cough.  He has not had any fevers, chills or sweats that he can tell but has had generalized malaise and weakness as noted.  He has not had any hemoptysis.  His appetite has been poor over the last 10 days.  He does not endorse any other symptomatology.  He has not had significant shortness of breath but has noted fatigue.   Review of Systems A 10  point review of systems was performed and it is as noted above otherwise negative.  Patient Active Problem List   Diagnosis Date Noted   Prostate cancer screening 09/08/2020   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[place]) 09/06/2020   Low back pain of over 3 months duration 08/19/2019   Pulmonary hypertension ([place])    Insomnia 12/21/2018   Sleep apnea in adult 12/09/2018   Periodic limb movement disorder 12/09/2018   Abnormal findings on diagnostic imaging of lung 07/09/2018   Lung nodule 07/09/2018   Pulmonary nodules 05/18/2018   Wears hearing aid in both ears 05/17/2018   Leg pain, bilateral 02/20/2018   Dyspnea    Effort angina (HCC)    CKD (chronic kidney disease) stage 3, GFR 30-59 ml/min ([place]) 09/21/2017   History of skin cancer in adulthood 08/14/2016   Parkinson's disease ([place]) 08/09/2016   Bilateral carotid artery stenosis 04/02/2015   Vertigo, peripheral 10/17/2014   Benign prostatic hypertrophy with urinary frequency 01/31/2014   Encounter for Medicare annual wellness exam 07/02/2013   Obesity 04/03/2013   Other malaise and fatigue 09/21/2012   Hyperlipidemia    Hypertension    3-vessel coronary artery disease    S/P CABG x 5    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 With food. Use instead of levaquin Q48 hours x 5 doses inform patient   aspirin EC 81 MG tablet Take 1 tablet (81 mg total) by mouth at bedtime.   carbidopa-levodopa (SINEMET CR) 50-200 MG tablet Take 1 tablet by mouth at bedtime.    carbidopa-levodopa (SINEMET IR) 25-100 MG tablet TAKE 1.5 TABLETS BY MOUTH THREE TIMES A DAY   carvedilol (COREG) 6.25 MG tablet TAKE ONE TABLET BY MOUTH TWICE A DAY   cephALEXin (KEFLEX) 500 MG capsule Take 1 capsule (500 mg total) by mouth 2 (two) times daily.   clonazePAM (KLONOPIN) 0.5 MG tablet TAKE ONE AND ONE-HALF TABLETS BY MOUTH EVERY NIGHT AT BEDTIME   furosemide (LASIX) 20 MG tablet Take 1 tablet (20 mg total) by mouth daily.   isosorbide mononitrate (IMDUR) 60 MG 24 hr tablet Take 1 tablet (60 mg total) by mouth daily.   losartan (COZAAR) 100 MG tablet Take 1 tablet (100 mg total) by mouth daily.   Multiple Vitamin (MULTIVITAMIN) tablet Take 1 tablet by mouth daily.   nitroGLYCERIN (NITROSTAT) 0.4 MG SL tablet Place 1 tablet (0.4 mg total) under the tongue every 5 (five) minutes as needed for chest pain.   Omega-3 Fatty Acids (FISH OIL) 1200 MG CAPS Take 1,200 mg by mouth daily.    omeprazole (PRILOSEC) 20 MG capsule TAKE ONE CAPSULE BY MOUTH EVERY MORNING   polyethylene glycol (MIRALAX / GLYCOLAX) packet Take 17 g by mouth daily.    ranolazine (RANEXA) 1000 MG SR tablet TAKE ONE TABLET BY MOUTH TWICE A DAY   tamsulosin (FLOMAX) 0.4 MG CAPS capsule TAKE ONE CAPSULE BY MOUTH DAILY   Immunization History  Administered Date(s) Administered   Fluad Quad(high Dose 65+) 03/09/2019   Influenza Inj Mdck Quad Pf 03/01/2020   Influenza Split 02/19/2011, 04/10/2014   Influenza, High Dose Seasonal PF 04/01/2013, 02/28/2015, 05/17/2018   Influenza,inj,Quad PF,6+ Mos 03/10/2016, 03/13/2017   Influenza-Unspecified 03/09/2012   PFIZER(Purple Top)SARS-COV-2 Vaccination 07/15/2019, 08/05/2019, 04/01/2020   Pneumococcal Conjugate-13 06/30/2013   Pneumococcal Polysaccharide-23 05/21/2011, 08/06/2016   Tdap 12/19/2010   Zoster, Live 05/09/2012       Objective:   Physical Exam BP (!) 110/40 (BP Location: Left Arm, Patient Position: Sitting, Cuff Size: Normal)   Pulse 63   Temp (!) 97.1 F  (36.2 C) (Temporal)   Ht 5\' 6"  (1.676 m)   Wt 172 lb 3.2 oz (78.1 kg)   SpO2 96%   BMI 27.79 kg/m  GENERAL: Elderly gentleman, no acute distress.  Well-developed, well-nourished, presents with need for walker.   HEAD: Normocephalic, atraumatic. EYES: Pupils equal, round, reactive to light.  No scleral icterus. MOUTH: Nose/mouth/throat not examined due to masking requirements for COVID 19. NECK: Supple. No thyromegaly. Trachea midline. No JVD.  No adenopathy. PULMONARY: Good air entry bilaterally.  Fine crackles on the right mid lung field.  Crackles and decreased breath sounds on the left lower lobe cyst. CARDIOVASCULAR: S1 and S2. Regular rate and rhythm.  No rubs, murmurs or gallops heard. ABDOMEN: Benign. MUSCULOSKELETAL: No  joint deformity, no clubbing, no edema. NEUROLOGIC: Pill rolling resting tremor noted particularly on the right upper extremity no other focal abnormality.  Gait is assisted by walker.  Speech is fluent. SKIN: Intact,warm,dry.  On limited exam no rashes. PSYCH: Flat affect, behavior normal.  Chest CT from 21 November 2020 shows bilateral pneumonia bases,     Assessment & Plan:     ICD-10-CM   1. Aspiration pneumonia of both lower lobes due to gastric secretions (HCC)  J69.0 DG Chest 2 View   Continue Augmentin Add azithromycin Prednisone 20 mg daily x 5 days Chest x-ray on 6/20 or 6/21    2. Parkinson's disease ([place])  G20    This issue adds complexity to his management Query dysphagia due to Parkinson's    3. Weakness  R53.1 CBC With Differential    Renal Function Panel    TSH + free T4    Cortisol   Check cortisol level Check thyroid panel Renal panel, CBC    4. Sleep apnea in adult  G47.30    Resume CPAP as tolerated    5. Solitary pulmonary nodule  R91.1    Unable to assess due to pneumonic process Will need follow-up CT in 8 to 12 weeks     Orders Placed This Encounter  Procedures   DG Chest 2 View    Standing Status:   Future     Standing Expiration Date:   11/23/2021    Order Specific Question:   Reason for Exam (SYMPTOM  OR DIAGNOSIS REQUIRED)    Answer:   Pneumonia    Order Specific Question:   Preferred imaging location?    Answer:   [facility]   CBC With Differential   Renal Function Panel   TSH + free T4   Cortisol   Meds ordered this encounter  Medications   azithromycin (ZITHROMAX) 250 MG tablet    Sig: Take 2 tablets (500 mg) on  Day 1,  followed by 1 tablet (250 mg) once daily on Days 2 through 5.    Dispense:  6 each    Refill:  0   predniSONE (DELTASONE) 20 MG tablet    Sig: Take 1 tablet (20 mg total) by mouth daily with breakfast for 5 days.    Dispense:  5 tablet    Refill:  0   We will see the patient in follow-up in 2 to 3 weeks time with either me or the nurse practitioner call sooner if symptoms worsen.  After his acute phase resolves, he may need formal speech pathology eval.  C. Derrill Kay, MD [facility]   *This note was dictated using voice recognition software/Dragon.  Despite best efforts to proofread, errors can occur which can change the meaning.  Any change was purely unintentional.

## 2020-11-23 NOTE — Telephone Encounter (Signed)
Appt scheduled for 11/23/2020 at 10:00.

## 2020-11-27 ENCOUNTER — Ambulatory Visit
Admission: RE | Admit: 2020-11-27 | Discharge: 2020-11-27 | Disposition: A | Discharge status: Home / Self Care | Payer: Medicare Other | Source: Ambulatory Visit | Attending: Pulmonary Disease | Admitting: Pulmonary Disease

## 2020-11-27 ENCOUNTER — Ambulatory Visit
Admission: RE | Admit: 2020-11-27 | Discharge: 2020-11-27 | Disposition: A | Discharge status: Home / Self Care | Payer: Medicare Other | Attending: Pulmonary Disease | Admitting: Pulmonary Disease

## 2020-11-27 DIAGNOSIS — J90 Pleural effusion, not elsewhere classified: Secondary | ICD-10-CM | POA: Diagnosis not present

## 2020-11-27 DIAGNOSIS — J690 Pneumonitis due to inhalation of food and vomit: Secondary | ICD-10-CM

## 2020-11-27 DIAGNOSIS — J189 Pneumonia, unspecified organism: Secondary | ICD-10-CM | POA: Diagnosis not present

## 2020-11-28 NOTE — Telephone Encounter (Signed)
Dr. Gonzalez, please advise. Thanks 

## 2020-11-28 NOTE — Telephone Encounter (Signed)
The x-ray shows that there is improvement on the pneumonia,that is, it is getting better.  This is good news.  Make sure he completes the antibiotics given.

## 2020-11-28 NOTE — Telephone Encounter (Signed)
He can resume his regular activities as he tolerates.  He does not need additional medication.  He needs to be very careful with preventing reflux and making sure he chews food well and pays attention to his swallowing.  As a general rule as I discussed with him during the visit he will get better before his x-ray does.  In general this takes about 8 weeks to completely clear off the x-ray and this is the reason why we will schedule a follow-up CT for approximately 8 to 12 weeks after his initial CT.

## 2020-12-03 ENCOUNTER — Encounter: Payer: Self-pay | Admitting: Internal Medicine

## 2020-12-03 DIAGNOSIS — G20 Parkinson's disease: Secondary | ICD-10-CM

## 2020-12-06 ENCOUNTER — Other Ambulatory Visit: Payer: Self-pay

## 2020-12-11 MED ORDER — CARBIDOPA-LEVODOPA 25-100 MG PO TABS: 2.0000 | ORAL_TABLET | Freq: Three times a day (TID) | ORAL | 0 refills | Status: DC

## 2020-12-13 NOTE — Progress Notes (Signed)
Cardiology Office Note:    Date:  12/14/2020   ID:  [name]., DOB Jul 07, 1945, MRN [id number]  PCP:  [name], MD  [facility] Cardiologist:  [name], MD  [facility] Electrophysiologist:  None   Referring MD: [name], MD   Chief Complaint: 2-3 month follow-up  History of Present Illness:    Johnathan Arnold. is a 75 y.o. male with a hx of CAD s/p CABG in 1999, HTN, CKD, HLD, b/l carotid artery disease, Parkinson's, OSA on CPAP, DM2, moderate pulmonary HTN who presents for follow-up.   Previous CABG in 1999.  He had a right and left heart cath 01/2018 with occluded native arteries with patent grafts including LIMA to LAD, SVG to diagonal, SVG to OM, SVG to distal RCA.  Right heart cath showed normal filling pressures, mild pulmonary hypertension, normal cardiac output.  Known chronic bilateral exertional leg pain with known lower extremity arterial Doppler in 2019.  He had a right heart cath 03/29/2019 with moderate pulmonary hypertension with wedge pressure 17 mmHg.  Pulmonary vascular resistance 2.22 Woods units.  Pulmonary hypertension felt to be mixed etiology.  Due to chronic bilateral exertional leg pain with ABI and aorta duplex summer 2021 showed no evidence of significant aortoiliac disease.  Last seen 09/28/20 and was stable from a cardiac perspective.  Aspirin decreased to 81mg  daily.   Today, the patient has questions about losartan. He stopped the Losartan 100mg  about 3-4 weeks ago. In 02/2020 [name] changed the losartan from 50 to100mg . Since then he has lost 15-20lbs. Says he feels tired and low energy on the higer dose. He says he felt better on the 50mg . When he stopped the losartan he felt better, feels bp in the 140s he feels the best. Bps at home very labile, 120s-160s/40-60s. No chest pain, LLE, orthopnea, pnd. He had a Parkinsons flare up in June and feels breathing has been worse since then. Feels breathing is worse on exertion,  wondering about repeating an echo. Also he stopped CPAP in June after flare-up, although he is starting to resume CPAP use as tolerated. Reports leg pain is unchanged. Has a lot of leg weakness and is going to see neurology next week.   Past Medical History:  Diagnosis Date   3-vessel coronary artery disease    s/p  5 vessel CABG   Diabetes mellitus without complication ([place])    History of cardiac catheterization 2011   [facility]   Hyperlipidemia    Hypertension    Hypertriglyceridemia    Parkinson's disease ([place])    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 LIMA to LAD, SVG to D1, OM1 and RPDA   CORONARY ARTERY BYPASS GRAFT  03/1998   5 vessel, [facility]   RIGHT HEART CATH N/A 04/04/2019   Procedure: RIGHT HEART CATH;  Surgeon: Wellington Hampshire, MD;  Location: [place] [facility];  Service: Cardiovascular;  Laterality: N/A;   RIGHT/LEFT HEART CATH AND CORONARY ANGIOGRAPHY N/A 02/01/2018   Procedure: RIGHT/LEFT HEART CATH AND CORONARY ANGIOGRAPHY;  Surgeon: Wellington Hampshire, MD;  Location: [place] [facility];  Service: Cardiovascular;  Laterality: N/A;    Current Medications: Current Meds  Medication Sig   aspirin EC 81 MG tablet Take 1 tablet (81 mg total) by mouth at bedtime.   carbidopa-levodopa (SINEMET CR) 50-200  MG tablet Take 1 tablet by mouth at bedtime.   carbidopa-levodopa (SINEMET IR) 25-100 MG tablet Take 2 tablets by mouth 3 (three) times daily.   carvedilol (COREG) 6.25 MG tablet TAKE ONE TABLET BY MOUTH TWICE A DAY   clonazePAM (KLONOPIN) 0.5 MG tablet TAKE ONE AND ONE-HALF TABLETS BY MOUTH EVERY NIGHT AT BEDTIME   furosemide (LASIX) 20 MG tablet Take 1 tablet (20 mg total) by mouth daily.   isosorbide mononitrate (IMDUR) 60 MG 24 hr tablet Take 1 tablet (60 mg total) by mouth daily.   losartan (COZAAR) 50 MG tablet Take 1 tablet (50 mg total) by mouth daily.    Multiple Vitamin (MULTIVITAMIN) tablet Take 1 tablet by mouth daily.   nitroGLYCERIN (NITROSTAT) 0.4 MG SL tablet Place 1 tablet (0.4 mg total) under the tongue every 5 (five) minutes as needed for chest pain.   omeprazole (PRILOSEC) 20 MG capsule TAKE ONE CAPSULE BY MOUTH EVERY MORNING   polyethylene glycol (MIRALAX / GLYCOLAX) packet Take 17 g by mouth daily.    ranolazine (RANEXA) 1000 MG SR tablet TAKE ONE TABLET BY MOUTH TWICE A DAY   rosuvastatin (CRESTOR) 10 MG tablet Take 1 tablet (10 mg total) by mouth daily.   tamsulosin (FLOMAX) 0.4 MG CAPS capsule TAKE ONE CAPSULE BY MOUTH DAILY     Allergies:   Patient has no known allergies.   Social History   Socioeconomic History   Marital status: Married    Spouse name: Not on file   Number of children: 2   Years of education: Not on file   Highest education level: Master's degree (e.g., MA, MS, MEng, MEd, MSW, MBA)  Occupational History   Occupation: retired    Comment: IT work  Tobacco Use   Smoking status: Never    Passive exposure: Yes   Smokeless tobacco: Never  Vaping Use   Vaping Use: Never used  Substance and Sexual Activity   Alcohol use: Yes    Comment: occasional beer   Drug use: No   Sexual activity: Not Currently  Other Topics Concern   Not on file  Social History Narrative   Right Handed    Lives in a two story home    Social Determinants of Health   Financial Resource Strain: Low Risk    Difficulty of Paying Living Expenses: Not hard at all  Food Insecurity: No Food Insecurity   Worried About Charity fundraiser in the Last Year: Never true   [place] in the Last Year: Never true  Transportation Needs: No Transportation Needs   Lack of Transportation (Medical): No   Lack of Transportation (Non-Medical): No  Physical Activity: Sufficiently Active   Days of Exercise per Week: 3 days   Minutes of Exercise per Session: 60 min  Stress: No Stress Concern Present   Feeling of Stress : Not at all   Social Connections: Unknown   Frequency of Communication with Friends and Family: More than three times a week   Frequency of Social Gatherings with Friends and Family: More than three times a week   Attends Religious Services: Not on Electrical engineer or Organizations: Yes   Attends Music therapist: More than 4 times per year   Marital Status: Married     Family History: The patient's family history includes Healthy in his son; Heart attack (age of onset: 55) in his father; Heart attack (age of onset: 52) in his mother; Heart  disease in his brother and father; Hypertension in his mother.  ROS:   Please see the history of present illness.     All other systems reviewed and are negative.  EKGs/Labs/Other Studies Reviewed:    The following studies were reviewed today:  Cardiac cath 03/2019 Successful right heart catheterization via the right brachial vein. RA pressure: 10/15 with a mean of 13 mmHg. RV: 56/4 with an end-diastolic pressure of 16 mmHg PA: 54/13 with a mean of 29 mmHg.  Pulmonary vascular resistance is 2.22 Woods units Pulmonary capillary wedge pressure is 17 mmHg with prominent V waves. Cardiac output is 5.4 L/min with a cardiac index of 2.79.   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  He does have prominent V waves suggestive of significant mitral regurgitation.  I did review his recent echocardiogram done in September and his moderate regurgitation was moderate at most. His volume status appears to be reasonable and I am hesitant to diurese him further given underlying chronic kidney disease.  Echo 2020 1. The left ventricle has normal systolic function with an ejection  fraction of 60-65%. The cavity size was normal. Left ventricular diastolic  Doppler parameters are consistent with  pseudonormalization.   2. The right ventricle has normal systolic function. The cavity was  moderately enlarged. There is Right vetricular wall thickness was not  assessed. Right ventricular systolic pressure is severely elevated with an  estimated pressure of 61 mmHg.   3. Left atrial size was mild-moderately dilated.   4. Right atrial size was mildly dilated.   5. No evidence of mitral valve stenosis.   6. Pulmonic valve regurgitation is mild by color flow Doppler.   7. The aorta is normal unless otherwise noted.   8. The inferior vena cava was is dilated in size with <50% respiratory  variability, suggesting right atrial pressure of 15 mmHg.   EKG:  EKG is not ordered today.    Recent Labs: 11/08/2020: ALT 7 11/23/2020: BUN 25; Creatinine, Ser 2.10; Hemoglobin 10.4; Platelets 326; Potassium 4.5; Sodium 132; TSH 1.241  Recent Lipid Panel    Component Value Date/Time   CHOL 121 09/06/2020 1022   TRIG 77.0 09/06/2020 1022   HDL 65.30 09/06/2020 1022   CHOLHDL 2 09/06/2020 1022   VLDL 15.4 09/06/2020 1022   LDLCALC 40 09/06/2020 1022   LDLDIRECT 38.0 08/06/2015 1118     Risk Assessment/Calculations:       Physical Exam:    VS:  BP (!) 144/60 (BP Location: Left Arm, Patient Position: Sitting, Cuff Size: Normal)   Pulse (!) 58   Ht 5\' 6"  (1.676 m)   Wt 170 lb (77.1 kg)   SpO2 96%   BMI 27.44 kg/m     Wt Readings from Last 3 Encounters:  12/14/20 170 lb (77.1 kg)  11/23/20 172 lb 3.2 oz (78.1 kg)  11/13/20 185 lb 6.4 oz (84.1 kg)     GEN:  Well nourished, well developed in no acute distress HEENT: Normal NECK: No JVD; No carotid bruits LYMPHATICS: No lymphadenopathy CARDIAC: RRR, no murmurs, rubs, gallops RESPIRATORY:  Clear to auscultation without rales, wheezing or rhonchi  ABDOMEN: Soft, non-tender, non-distended MUSCULOSKELETAL:  No edema; No deformity  SKIN: Warm and dry NEUROLOGIC:  Alert and oriented x 3 PSYCHIATRIC:  Normal affect   ASSESSMENT:    1.  Hypertension, unspecified type   2.  Bilateral carotid artery stenosis   3. Pulmonary hypertension, unspecified ([place])   4. Coronary artery disease involving native coronary artery of native heart without angina pectoris   5. Hyperlipidemia LDL goal <70   6. Essential hypertension   7. Claudication ([place])   8. Dyspnea on exertion    PLAN:    In order of problems listed above:  HTN Losartan was increased from 50 to 100mg  in 02/2020. Since that time he has lost 10-15 lbs and feels energy is low on the higher dose. He stopped Losartan all together 3-4 weeks ago, says he feels better. Bp here is mildly elevated. At home Bps elevated as well. I recommend restart losartan 50mg  daily. BMET in a week with CKD. He can follow-up with [name] at follow-up. Continue to check BP at home.   Dyspnea on exertion HFpEF Reports DOE that is worse since his Parkinson's flare-up in June. Also stopped using his CPAP, but is slowly getting back to it. I recommended resumption of CPAP. Last echo from 02/2019 showed LVEF 60-65%, RV moderately enlarged, RV systolic pressure severely elevated with pressure 21mmHg, mild-mod dialted LA, mildly dialted RA. He takes lasix 20 mg daily. He appears euvolemic on exam. Continue Lopressor and re-start Losartan. I will repeat an echo. If shortness of breath does not improve may need to consider repeat RHC.   CAD s/p remote CABG Patient denies chest pain. Reports DOE since Parkinson's flare up as above. I will check an echo. Continue Aspirin, statin, BB, Imdur, Ranexa.  Moderate pulmonary HTN OSA on CPAP Moderate pulmonary HTN by [place] 2020, mixed etiology. He stopped using CPAP in June after Parkinsons flare up. Recommended resumption of CPAP as tolerated. Continue Lasix.   B/l carotid artery disease No new symptoms.   HLD LDL 40 in 08/2020. Continue Crestor 10mg  daily.  Leg pain This is unchanged. Has apt next week to see neurology reagrding leg weakness.  Disposition:  Follow up in 3 month(s) with [name]     Signed, [place], PA-C  12/14/2020 11:46 AM    [place]

## 2020-12-14 ENCOUNTER — Encounter: Payer: Self-pay | Admitting: Medical

## 2020-12-14 ENCOUNTER — Other Ambulatory Visit: Payer: Self-pay

## 2020-12-14 ENCOUNTER — Ambulatory Visit (INDEPENDENT_AMBULATORY_CARE_PROVIDER_SITE_OTHER): Payer: Medicare Other | Admitting: Medical

## 2020-12-14 ENCOUNTER — Other Ambulatory Visit: Payer: Self-pay | Admitting: Cardiovascular Disease

## 2020-12-14 VITALS — BP 144/60 | HR 58 | Ht 66.0 in | Wt 170.0 lb

## 2020-12-14 DIAGNOSIS — I10 Essential (primary) hypertension: Secondary | ICD-10-CM | POA: Diagnosis not present

## 2020-12-14 DIAGNOSIS — I2510 Atherosclerotic heart disease of native coronary artery without angina pectoris: Secondary | ICD-10-CM | POA: Diagnosis not present

## 2020-12-14 DIAGNOSIS — E785 Hyperlipidemia, unspecified: Secondary | ICD-10-CM | POA: Diagnosis not present

## 2020-12-14 DIAGNOSIS — R0600 Dyspnea, unspecified: Secondary | ICD-10-CM | POA: Diagnosis not present

## 2020-12-14 DIAGNOSIS — I6523 Occlusion and stenosis of bilateral carotid arteries: Secondary | ICD-10-CM

## 2020-12-14 DIAGNOSIS — I272 Pulmonary hypertension, unspecified: Secondary | ICD-10-CM

## 2020-12-14 DIAGNOSIS — R0609 Other forms of dyspnea: Secondary | ICD-10-CM

## 2020-12-14 DIAGNOSIS — I739 Peripheral vascular disease, unspecified: Secondary | ICD-10-CM

## 2020-12-14 MED ORDER — LOSARTAN POTASSIUM 50 MG PO TABS: 50.0000 mg | ORAL_TABLET | Freq: Every day | ORAL | 3 refills | Status: DC

## 2020-12-14 NOTE — Patient Instructions (Signed)
Medication Instructions:  - Your physician has recommended you make the following change in your medication:   1) RESUME losartan at 50 mg- take 1 tablet by mouth once daily   - you may use up your current supply of losartan 100 mg tablets: take 0.5 tablet (50 mg) once daily   *If you need a refill on your cardiac medications before your next appointment, please call your pharmacy*   Lab Work: - Your physician recommends that you return for non-fasting lab work in: 1 week- BMP  Nature conservation officer at [facility] 1st desk on the right to check in, past the screening table Lab hours: Monday- Friday (7:30 am- 5:30 pm)    If you have labs (blood work) drawn today and your tests are completely normal, you will receive your results only by: MyChart Message (if you have MyChart) OR A paper copy in the mail If you have any lab test that is abnormal or we need to change your treatment, we will call you to review the results.   Testing/Procedures: - Your physician has requested that you have an echocardiogram. Echocardiography is a painless test that uses sound waves to create images of your heart. It provides your doctor with information about the size and shape of your heart and how well your heart's chambers and valves are working. This procedure takes approximately one hour. There are no restrictions for this procedure. There is a possibility that an IV may need to be started during your test to inject an image enhancing agent. This is done to obtain more optimal pictures of your heart. Therefore we ask that you do at least drink some water prior to coming in to hydrate your veins.     Follow-Up: At [facility], you and your health needs are our priority.  As part of our continuing mission to provide you with exceptional heart care, we have created designated Provider Care Teams.  These Care Teams include your primary Cardiologist (physician) and Advanced Practice Providers (APPs -  Physician  Assistants and Nurse Practitioners) who all work together to provide you with the care you need, when you need it.  We recommend signing up for the patient portal called "MyChart".  Sign up information is provided on this After Visit Summary.  MyChart is used to connect with patients for Virtual Visits (Telemedicine).  Patients are able to view lab/test results, encounter notes, upcoming appointments, etc.  Non-urgent messages can be sent to your provider as well.   To learn more about what you can do with MyChart, go to [email or web address].    Your next appointment:   2-3 month(s)  The format for your next appointment:   In Person  Provider:   [name], MD   Other Instructions  Echocardiogram An echocardiogram is a test that uses sound waves (ultrasound) to produce images of the heart. Images from an echocardiogram can provide important information about: Heart size and shape. The size and thickness and movement of your heart's walls. Heart muscle function and strength. Heart valve function or if you have stenosis. Stenosis is when the heart valves are too narrow. If blood is flowing backward through the heart valves (regurgitation). A tumor or infectious growth around the heart valves. Areas of heart muscle that are not working well because of poor blood flow or injury from a heart attack. Aneurysm detection. An aneurysm is a weak or damaged part of an artery wall. The wall bulges out from the normal force of blood  pumping through the body. Tell a health care provider about: Any allergies you have. All medicines you are taking, including vitamins, herbs, eye drops, creams, and over-the-counter medicines. Any blood disorders you have. Any surgeries you have had. Any medical conditions you have. Whether you are pregnant or may be pregnant. What are the risks? Generally, this is a safe test. However, problems may occur, including an allergic reaction to dye (contrast)  that may be used during the test. What happens before the test? No specific preparation is needed. You may eat and drink normally. What happens during the test?  You will take off your clothes from the waist up and put on a hospital gown. Electrodes or electrocardiogram (ECG)patches may be placed on your chest. The electrodes or patches are then connected to a device that monitors your heart rate and rhythm. You will lie down on a table for an ultrasound exam. A gel will be applied to your chest to help sound waves pass through your skin. A handheld device, called a transducer, will be pressed against your chest and moved over your heart. The transducer produces sound waves that travel to your heart and bounce back (or "echo" back) to the transducer. These sound waves will be captured in real-time and changed into images of your heart that can be viewed on a video monitor. The images will be recorded on a computer and reviewed by your health care provider. You may be asked to change positions or hold your breath for a short time. This makes it easier to get different views or better views of your heart. In some cases, you may receive contrast through an IV in one of your veins. This can improve the quality of the pictures from your heart. The procedure may vary among health care providers and hospitals. What can I expect after the test? You may return to your normal, everyday life, including diet, activities, andmedicines, unless your health care provider tells you not to do that. Follow these instructions at home: It is up to you to get the results of your test. Ask your health care provider, or the department that is doing the test, when your results will be ready. Keep all follow-up visits. This is important. Summary An echocardiogram is a test that uses sound waves (ultrasound) to produce images of the heart. Images from an echocardiogram can provide important information about the size and  shape of your heart, heart muscle function, heart valve function, and other possible heart problems. You do not need to do anything to prepare before this test. You may eat and drink normally. After the echocardiogram is completed, you may return to your normal, everyday life, unless your health care provider tells you not to do that. This information is not intended to replace advice given to you by your health care provider. Make sure you discuss any questions you have with your healthcare provider. Document Revised: 01/17/2020 Document Reviewed: 01/17/2020 Elsevier Patient Education  2022 [employer].

## 2020-12-18 ENCOUNTER — Ambulatory Visit (INDEPENDENT_AMBULATORY_CARE_PROVIDER_SITE_OTHER): Payer: Medicare Other | Admitting: Neurology

## 2020-12-18 ENCOUNTER — Other Ambulatory Visit: Payer: Self-pay

## 2020-12-18 DIAGNOSIS — R29898 Other symptoms and signs involving the musculoskeletal system: Secondary | ICD-10-CM

## 2020-12-18 DIAGNOSIS — R269 Unspecified abnormalities of gait and mobility: Secondary | ICD-10-CM

## 2020-12-18 NOTE — Procedures (Signed)
[facility]  [address], [address]  [place], [place] [address] Tel: [phone] Fax:  [phone] Test Date:  12/18/2020  Patient: Johnathan Arnold DOB: 1946-01-06 Physician: Narda Amber, DO  Sex: Male Height: 5\' 6"  Ref Phys: Orland Mustard, MD  ID#: [id number]   Technician:    Patient Complaints: This is a 75 year old man referred for evaluation of bilateral leg weakness.  NCV & EMG Findings: Electrodiagnostic testing of the right lower extremity and additional studies of the left shows: Bilateral sural and superficial peroneal sensory responses are within normal limits. Bilateral peroneal and tibial motor responses are within normal limits. Bilateral tibial H reflex studies are within normal limits. There is no evidence of active or chronic motor axonal changes affecting any of the tested muscles.  Motor unit configuration and recruitment pattern is within normal limits.  Impression: This is a normal study of the lower extremities.  In particular, there is no evidence of a sensorimotor polyneuropathy, myopathy, or lumbosacral radiculopathy.   ___________________________ Narda Amber, DO    Nerve Conduction Studies Anti Sensory Summary Table   Stim Site NR Peak (ms) Norm Peak (ms) P-T Amp (V) Norm P-T Amp  Left Sup Peroneal Anti Sensory (Ant Lat Mall)  32C  12 cm    3.6 <4.6 6.8 >3  Right Sup Peroneal Anti Sensory (Ant Lat Mall)  32C  12 cm    2.8 <4.6 5.0 >3  Left Sural Anti Sensory (Lat Mall)  32C  Calf    3.1 <4.6 6.5 >3  Right Sural Anti Sensory (Lat Mall)  32C  Calf    3.4 <4.6 8.5 >3   Motor Summary Table   Stim Site NR Onset (ms) Norm Onset (ms) O-P Amp (mV) Norm O-P Amp Site1 Site2 Delta-0 (ms) Dist (cm) Vel (m/s) Norm Vel (m/s)  Left Peroneal Motor (Ext Dig Brev)  32C  Ankle    4.0 <6.0 2.5 >2.5 B Fib Ankle 9.1 38.0 42 >40  B Fib    13.1  1.9  Poplt B Fib 1.6 7.0 44 >40  Poplt    14.7  1.7         Right Peroneal Motor (Ext Dig  Brev)  32C  Ankle    3.0 <6.0 2.7 >2.5 B Fib Ankle 9.0 37.0 41 >40  B Fib    12.0  2.1  Poplt B Fib 1.8 8.0 44 >40  Poplt    13.8  1.9         Left Tibial Motor (Abd Hall Brev)  32C  Ankle    3.9 <6.0 4.8 >4 Knee Ankle 10.6 42.0 40 >40  Knee    14.5  2.8         Right Tibial Motor (Abd Hall Brev)  32C  Ankle    3.3 <6.0 4.9 >4 Knee Ankle 9.6 39.0 41 >40  Knee    12.9  3.1          H Reflex Studies   NR H-Lat (ms) Lat Norm (ms) L-R H-Lat (ms)  Left Tibial (Gastroc)  32C     33.74 <35 0.82  Right Tibial (Gastroc)  32C     34.56 <35 0.82   EMG   Side Muscle Ins Act Fibs Psw Fasc Number Recrt Dur Dur. Amp Amp. Poly Poly. Comment  Right AntTibialis Nml Nml Nml Nml Nml Nml Nml Nml Nml Nml Nml Nml N/A  Right Gastroc Nml Nml Nml Nml Nml Nml Nml Nml Nml Nml Nml Nml  N/A  Right Flex Dig Long Nml Nml Nml Nml Nml Nml Nml Nml Nml Nml Nml Nml N/A  Right RectFemoris Nml Nml Nml Nml Nml Nml Nml Nml Nml Nml Nml Nml N/A  Right GluteusMed Nml Nml Nml Nml Nml Nml Nml Nml Nml Nml Nml Nml N/A  Left AntTibialis Nml Nml Nml Nml Nml Nml Nml Nml Nml Nml Nml Nml N/A  Left Gastroc Nml Nml Nml Nml Nml Nml Nml Nml Nml Nml Nml Nml N/A  Left Flex Dig Long Nml Nml Nml Nml Nml Nml Nml Nml Nml Nml Nml Nml N/A  Left RectFemoris Nml Nml Nml Nml Nml Nml Nml Nml Nml Nml Nml Nml N/A  Left GluteusMed Nml Nml Nml Nml Nml Nml Nml Nml Nml Nml Nml Nml N/A      Waveforms:

## 2020-12-19 ENCOUNTER — Ambulatory Visit: Payer: Medicare Other | Admitting: Internal Medicine

## 2020-12-20 ENCOUNTER — Encounter: Payer: Self-pay | Admitting: Internal Medicine

## 2020-12-20 ENCOUNTER — Ambulatory Visit (INDEPENDENT_AMBULATORY_CARE_PROVIDER_SITE_OTHER): Payer: Medicare Other | Admitting: Internal Medicine

## 2020-12-20 ENCOUNTER — Other Ambulatory Visit: Payer: Self-pay

## 2020-12-20 VITALS — BP 116/54 | HR 51 | Temp 98.2°F | Ht 66.0 in | Wt 171.4 lb

## 2020-12-20 DIAGNOSIS — E1121 Type 2 diabetes mellitus with diabetic nephropathy: Secondary | ICD-10-CM | POA: Diagnosis not present

## 2020-12-20 DIAGNOSIS — I208 Other forms of angina pectoris: Secondary | ICD-10-CM | POA: Diagnosis not present

## 2020-12-20 DIAGNOSIS — R29898 Other symptoms and signs involving the musculoskeletal system: Secondary | ICD-10-CM

## 2020-12-20 DIAGNOSIS — N183 Chronic kidney disease, stage 3 unspecified: Secondary | ICD-10-CM | POA: Diagnosis not present

## 2020-12-20 DIAGNOSIS — D631 Anemia in chronic kidney disease: Secondary | ICD-10-CM

## 2020-12-20 DIAGNOSIS — E7800 Pure hypercholesterolemia, unspecified: Secondary | ICD-10-CM | POA: Diagnosis not present

## 2020-12-20 DIAGNOSIS — N1832 Chronic kidney disease, stage 3b: Secondary | ICD-10-CM | POA: Diagnosis not present

## 2020-12-20 DIAGNOSIS — D709 Neutropenia, unspecified: Secondary | ICD-10-CM | POA: Diagnosis not present

## 2020-12-20 DIAGNOSIS — E871 Hypo-osmolality and hyponatremia: Secondary | ICD-10-CM

## 2020-12-20 LAB — CBC WITH DIFFERENTIAL/PLATELET
Basophils Absolute: 0 10*3/uL (ref 0.0–0.1)
Basophils Relative: 0.4 % (ref 0.0–3.0)
Eosinophils Absolute: 0 10*3/uL (ref 0.0–0.7)
Eosinophils Relative: 0.8 % (ref 0.0–5.0)
HCT: 31.4 % — ABNORMAL LOW (ref 39.0–52.0)
Hemoglobin: 10.8 g/dL — ABNORMAL LOW (ref 13.0–17.0)
Lymphocytes Relative: 46.6 % — ABNORMAL HIGH (ref 12.0–46.0)
Lymphs Abs: 1.8 10*3/uL (ref 0.7–4.0)
MCHC: 34.3 g/dL (ref 30.0–36.0)
MCV: 99.1 fl (ref 78.0–100.0)
Monocytes Absolute: 0.2 10*3/uL (ref 0.1–1.0)
Monocytes Relative: 5.2 % (ref 3.0–12.0)
Neutro Abs: 1.8 10*3/uL (ref 1.4–7.7)
Neutrophils Relative %: 47 % (ref 43.0–77.0)
Platelets: 182 10*3/uL (ref 150.0–400.0)
RBC: 3.17 Mil/uL — ABNORMAL LOW (ref 4.22–5.81)
RDW: 15.4 % (ref 11.5–15.5)
WBC: 3.9 10*3/uL — ABNORMAL LOW (ref 4.0–10.5)

## 2020-12-20 LAB — BASIC METABOLIC PANEL
BUN: 14 mg/dL (ref 6–23)
CO2: 29 mEq/L (ref 19–32)
Calcium: 9 mg/dL (ref 8.4–10.5)
Chloride: 101 mEq/L (ref 96–112)
Creatinine, Ser: 1.76 mg/dL — ABNORMAL HIGH (ref 0.40–1.50)
GFR: 37.25 mL/min — ABNORMAL LOW (ref >60.00)
Glucose, Bld: 89 mg/dL (ref 70–99)
Potassium: 4.9 mEq/L (ref 3.5–5.1)
Sodium: 136 mEq/L (ref 135–145)

## 2020-12-20 MED ORDER — LOSARTAN POTASSIUM 25 MG PO TABS: 25.0000 mg | ORAL_TABLET | Freq: Every day | ORAL | 1 refills | Status: DC

## 2020-12-20 MED ORDER — ZOSTER VAC RECOMB ADJUVANTED 50 MCG/0.5ML IM SUSR: 0.5000 mL | Freq: Once | INTRAMUSCULAR | 1 refills | Status: AC

## 2020-12-20 NOTE — Assessment & Plan Note (Signed)
Cardiology recommended he resume losartan at 50 mg on  July 8  (see most recent note).

## 2020-12-20 NOTE — Assessment & Plan Note (Signed)
Strength is 5/5 on exam today.  Recent episode was evaluated wi  MRI lumbar spine. EMG studies,  Both  Negative for acute chagnes.  Suspect that his LLL PNA superimposed over his Parkinson's Disease and worsening anemia of ckd  was the cause.  Recommend that he resume PT (ordred June 28 but not contacted yet) and use a walker with seat when out of home.

## 2020-12-20 NOTE — Assessment & Plan Note (Addendum)
Progressive,  With worsening anemia . Repeating labs  today along with EPO level note improved Cr and stable hgb  Lab Results  Component Value Date   CREATININE 1.76 (H) 12/20/2020   Lab Results  Component Value Date   WBC 3.9 (L) 12/20/2020   HGB 10.8 (L) 12/20/2020   HCT 31.4 (L) 12/20/2020   MCV 99.1 12/20/2020   PLT 182.0 12/20/2020   .

## 2020-12-20 NOTE — Patient Instructions (Addendum)
If your sodium is still low on today's labs,  I will recommend you increase the salt in your diet   Use the walker with a seat when you leave the house   I have lowered your dose of  losartan to 25 mg daily .

## 2020-12-20 NOTE — Progress Notes (Addendum)
Subjective:  Patient ID: Johnathan Arnold., male    DOB: 1945-06-15  Age: 75 y.o. MRN: [id number]  CC: The primary encounter diagnosis was Hyponatremia. 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  HPI [employer]. presents for follow up  on multiple issues that seemed to have started on Jun 1   This visit occurred during the SARS-CoV-2 public health emergency.  Safety protocols were in place, including screening questions prior to the visit, additional usage of staff PPE, and extensive cleaning of exam room while observing appropriate contact time as indicated for disinfecting solutions.   HTN: patient stopped losartan due to recurrent symptomatic hypotension.  Saw cardiology on July 8 and losartan was resumed at 50 mg daily, but he has not resumed it.  Home readings have been labile ; his systolic pressure will often drop by 30 pts over several hours in spite of suspending the losartan, and he is afraid of falling   He developed bilateral leg weakness  on Jun 1 . States that he woke up one morning and was not able to stand,  but was  able to move legs while sitting on the side of the bed .  legs were giving way if he put any weight on them.  Patient was evaluated by [place] with MRI lumbar spine,  CT Abd and pelvis,  EMG/[place] studies  and CT chest (ordered by Pulmonology) and no acute changes except for a fairly extensive  LLL pneumonia which was treated by Dr Patsey Berthold.   Symptoms persisted for several weeks  but have improved.   during that time .stopped going to [facility] for the month of June   Resumed [place] 2 weeks ago .  Had an episode of legs giving way during a trip to the supermarket .yesterday,  occurred whenb he was not   using a walker, but has one .  Reviewed safety measures installed in the house.  He has all amenities .     Outpatient Medications Prior to Visit  Medication Sig Dispense Refill   aspirin EC 81 MG tablet Take 1 tablet (81 mg total) by mouth at bedtime. 30 tablet 11   carbidopa-levodopa (SINEMET CR) 50-200 MG tablet Take 1 tablet by mouth at bedtime. 90 tablet 2   carbidopa-levodopa (SINEMET IR) 25-100 MG tablet Take 2 tablets by mouth 3 (three) times daily. 540 tablet 0   carvedilol (COREG) 6.25 MG tablet TAKE ONE TABLET BY MOUTH TWICE A DAY 180 tablet 1   clonazePAM (KLONOPIN) 0.5 MG tablet TAKE ONE AND ONE-HALF TABLETS BY MOUTH EVERY NIGHT AT BEDTIME 45 tablet 0   isosorbide mononitrate (IMDUR) 60 MG 24 hr tablet Take 1 tablet (60 mg total) by mouth daily. 90 tablet 1   Multiple Vitamin (MULTIVITAMIN) tablet Take 1 tablet by mouth daily.     nitroGLYCERIN (NITROSTAT) 0.4 MG SL tablet Place 1 tablet (0.4 mg total) under the tongue every 5 (five) minutes as needed for chest pain. 25 tablet 1   omeprazole (PRILOSEC) 20 MG capsule TAKE ONE CAPSULE BY MOUTH EVERY MORNING 90 capsule 0   polyethylene glycol (MIRALAX / GLYCOLAX) packet Take 17 g by mouth daily.      ranolazine (RANEXA) 1000 MG SR tablet TAKE ONE TABLET BY MOUTH  TWICE A DAY 60 tablet 1   rosuvastatin (CRESTOR) 10 MG tablet Take 1 tablet (10 mg total) by mouth daily. 90 tablet 3   tamsulosin (FLOMAX) 0.4 MG CAPS capsule TAKE ONE CAPSULE BY MOUTH DAILY 90 capsule 3   furosemide (LASIX) 20 MG tablet Take 1 tablet (20 mg total) by mouth daily. 90 tablet 3   amoxicillin-clavulanate (AUGMENTIN) 875-125 MG tablet Take 1 tablet by mouth 2 (two) times daily. With food. Use instead of levaquin Q48 hours x 5 doses inform patient (Patient not taking: Reported on 12/14/2020) 14 tablet 0   azithromycin (ZITHROMAX) 250 MG tablet Take 2 tablets (500 mg) on  Day 1,  followed by 1 tablet (250 mg) once daily on Days 2 through 5. (Patient not taking:  Reported on 12/14/2020) 6 each 0   losartan (COZAAR) 50 MG tablet Take 1 tablet (50 mg total) by mouth daily. (Patient not taking: Reported on 12/20/2020) 90 tablet 3   Omega-3 Fatty Acids (FISH OIL) 1200 MG CAPS Take 1,200 mg by mouth daily.  (Patient not taking: Reported on 12/14/2020)     No facility-administered medications prior to visit.    Review of Systems;  Patient denies headache, fevers, malaise, unintentional weight loss, skin rash, eye pain, sinus congestion and sinus pain, sore throat, dysphagia,  hemoptysis , cough, dyspnea, wheezing, chest pain, palpitations, orthopnea, edema, abdominal pain, nausea, melena, diarrhea, constipation, flank pain, dysuria, hematuria, urinary  Frequency, nocturia, numbness, tingling, seizures,  Focal weakness, Loss of consciousness,  Tremor, insomnia, depression, anxiety, and suicidal ideation.      Objective:  BP (!) 116/54 (BP Location: Left Arm, Patient Position: Sitting, Cuff Size: Large)   Pulse (!) 51   Temp 98.2 F (36.8 C) (Oral)   Ht 5\' 6"  (1.676 m)   Wt 171 lb 6.4 oz (77.7 kg)   SpO2 99%   BMI 27.66 kg/m   BP Readings from Last 3 Encounters:  12/20/20 (!) 116/54  12/14/20 (!) 144/60  11/23/20 (!) 110/40    Wt Readings from Last 3 Encounters:  12/20/20 171 lb 6.4 oz (77.7 kg)  12/14/20 170 lb (77.1 kg)  11/23/20 172 lb 3.2 oz (78.1 kg)    General appearance: alert, cooperative and appears stated age Ears: normal TM's and external ear canals both ears Throat: lips, mucosa, and tongue normal; teeth and gums normal Neck: no adenopathy, no carotid bruit, supple, symmetrical, trachea midline and thyroid not enlarged, symmetric, no tenderness/mass/nodules Back: symmetric, no curvature. ROM normal. No CVA tenderness. Lungs: clear to auscultation bilaterally Heart: regular rate and rhythm, S1, S2 normal, no murmur, click, rub or gallop Abdomen: soft, non-tender; bowel sounds normal; no masses,  no organomegaly Pulses: 2+ and  symmetric Skin: Skin color, texture, turgor normal. No rashes or lesions Lymph nodes: Cervical, supraclavicular, and axillary nodes normal.  Lab Results  Component Value Date   HGBA1C 5.8 09/06/2020   HGBA1C 5.8 08/17/2019   HGBA1C 5.4 12/20/2018    Lab Results  Component Value Date   CREATININE 1.76 (H) 12/20/2020   CREATININE 2.10 (H) 11/23/2020   CREATININE 2.15 (H) 11/08/2020    Lab Results  Component Value Date   WBC 3.9 (L) 12/20/2020   HGB 10.8 (L) 12/20/2020   HCT 31.4 (L) 12/20/2020   PLT 182.0 12/20/2020   GLUCOSE 89 12/20/2020   CHOL 121 09/06/2020   TRIG 77.0 09/06/2020   HDL 65.30 09/06/2020   LDLDIRECT 38.0 08/06/2015   LDLCALC 40 09/06/2020   ALT 7 11/08/2020  AST 21 11/08/2020   NA 136 12/20/2020   K 4.9 12/20/2020   CL 101 12/20/2020   CREATININE 1.76 (H) 12/20/2020   BUN 14 12/20/2020   CO2 29 12/20/2020   TSH 1.241 11/23/2020   PSA 1.10 09/06/2020   HGBA1C 5.8 09/06/2020   MICROALBUR <0.7 08/17/2019    DG Chest 2 View  Result Date: 11/28/2020 CLINICAL DATA:  Pneumonia.  Follow-up exam. EXAM: CHEST - 2 VIEW COMPARISON:  CT 11/21/2020.  Chest x-ray 11/16/2020. FINDINGS: Prior CABG. Heart size stable. Persistent but improved left base infiltrate and small left pleural effusion. Degenerative change thoracic spine. IMPRESSION: Persistent but improved left base infiltrate and small left pleural effusion. Electronically Signed   By: Marcello Moores  Register   On: 11/28/2020 14:41    Assessment & Plan:   Problem List Items Addressed This Visit       Unprioritized   Bilateral leg weakness    Strength is 5/5 on exam today.  Recent episode was evaluated wi  MRI lumbar spine. EMG studies,  Both  Negative for acute chagnes.  Suspect that his LLL PNA superimposed over his Parkinson's Disease and worsening anemia of ckd  was the cause.  Recommend that he resume PT (ordred June 28 but not contacted yet) and use a walker with seat when out of home.        CKD  (chronic kidney disease) stage 3, GFR 30-59 ml/min (HCC)    Progressive,  With worsening anemia . Repeating labs  today along with EPO level note improved Cr and stable hgb  Lab Results  Component Value Date   CREATININE 1.76 (H) 12/20/2020   Lab Results  Component Value Date   WBC 3.9 (L) 12/20/2020   HGB 10.8 (L) 12/20/2020   HCT 31.4 (L) 12/20/2020   MCV 99.1 12/20/2020   PLT 182.0 12/20/2020   .      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 "Mike"'s Fish Oil, amoxicillin-clavulanate, azithromycin, and losartan. I am also having him start on losartan and Zoster Vaccine Adjuvanted. Additionally, I am having him maintain his multivitamin, polyethylene glycol, furosemide, tamsulosin, rosuvastatin, carvedilol, isosorbide mononitrate, aspirin EC, nitroGLYCERIN, omeprazole, carbidopa-levodopa, clonazePAM, carbidopa-levodopa, and ranolazine.  Meds ordered this encounter  Medications   losartan (COZAAR) 25 MG tablet    Sig: Take 1 tablet (25 mg total) by mouth daily.    Dispense:  90 tablet    Refill:  1   Zoster Vaccine Adjuvanted [facility]) injection    Sig: Inject 0.5 mLs into the muscle once for 1 dose.    Dispense:  1 each    Refill:  1    Medications Discontinued During This Encounter  Medication  Reason    amoxicillin-clavulanate (AUGMENTIN) 875-125 MG tablet Completed Course   azithromycin (ZITHROMAX) 250 MG tablet Completed Course   Omega-3 Fatty Acids (FISH OIL) 1200 MG CAPS Completed Course   losartan (COZAAR) 50 MG tablet   A total of 40 minutes was spent with patient more than half of which was spent in counseling patient on the above mentioned issues , reviewing and explaining recent labs and imaging studies done, and coordination of care.   Follow-up: No follow-ups on file.   [name], MD

## 2020-12-21 ENCOUNTER — Telehealth: Payer: Self-pay | Admitting: Internal Medicine

## 2020-12-21 NOTE — Telephone Encounter (Signed)
Lft vm at [facility] rehab to follow up on referral. thanks

## 2020-12-22 DIAGNOSIS — E871 Hypo-osmolality and hyponatremia: Secondary | ICD-10-CM | POA: Insufficient documentation

## 2020-12-22 NOTE — Assessment & Plan Note (Signed)
Resolved on repeat assessment

## 2020-12-23 NOTE — Addendum Note (Signed)
Addended by: [name] on: 12/23/2020 12:14 AM   Modules accepted: Orders

## 2020-12-24 LAB — ERYTHROPOIETIN: Erythropoietin: 16.5 m[IU]/mL (ref 2.6–18.5)

## 2020-12-24 NOTE — Telephone Encounter (Signed)
Tammy with [facility] outpatient rehab called and states that they did receive the referral on Friday and they will call him today. Tammy said that the facility closes early on Fridays. FYI

## 2020-12-28 ENCOUNTER — Encounter: Payer: Self-pay | Admitting: Adult Health

## 2020-12-28 ENCOUNTER — Telehealth: Payer: Self-pay

## 2020-12-28 ENCOUNTER — Ambulatory Visit (INDEPENDENT_AMBULATORY_CARE_PROVIDER_SITE_OTHER): Payer: Medicare Other | Admitting: Adult Health

## 2020-12-28 ENCOUNTER — Other Ambulatory Visit: Payer: Self-pay

## 2020-12-28 VITALS — BP 110/60 | HR 99 | Temp 97.5°F | Ht 66.0 in | Wt 173.0 lb

## 2020-12-28 DIAGNOSIS — G4730 Sleep apnea, unspecified: Secondary | ICD-10-CM

## 2020-12-28 DIAGNOSIS — J690 Pneumonitis due to inhalation of food and vomit: Secondary | ICD-10-CM

## 2020-12-28 DIAGNOSIS — J189 Pneumonia, unspecified organism: Secondary | ICD-10-CM | POA: Insufficient documentation

## 2020-12-28 DIAGNOSIS — R911 Solitary pulmonary nodule: Secondary | ICD-10-CM | POA: Diagnosis not present

## 2020-12-28 DIAGNOSIS — I208 Other forms of angina pectoris: Secondary | ICD-10-CM | POA: Diagnosis not present

## 2020-12-28 HISTORY — DX: Pneumonia, unspecified organism: J18.9

## 2020-12-28 NOTE — Progress Notes (Signed)
Agree with the details of the visit as noted by Tammy Parrett, NP.  C. Laura Amila Callies, MD [facility] 

## 2020-12-28 NOTE — Assessment & Plan Note (Signed)
Left lower lobe nodularity will need follow-up CT chest in 8 weeks.

## 2020-12-28 NOTE — Telephone Encounter (Signed)
LVM for patient in regards to upcoming appointment, would like to know if patient is still using his cpap machine . Will try again later.

## 2020-12-28 NOTE — Patient Instructions (Addendum)
Chest xray today .  Set up CT chest without contrast in 8 weeks Restart CPAP At bedtime , wear all night long .   Do not eat 3 hrs before bedtimes. Stay upright for 2 hrs after eating .  Follow up with Dr. Patsey Berthold in 2 months and As needed   Please contact office for sooner follow up if symptoms do not improve or worsen or seek emergency care

## 2020-12-28 NOTE — Assessment & Plan Note (Signed)
We discussed CPAP compliance.  Patient education on healthy sleep regimen   Plan  Patient Instructions  Chest xray today .  Set up CT chest without contrast in 8 weeks Restart CPAP At bedtime , wear all night long .   Do not eat 3 hrs before bedtimes. Stay upright for 2 hrs after eating .  Follow up with Dr. Patsey Berthold in 2 months and As needed   Please contact office for sooner follow up if symptoms do not improve or worsen or seek emergency care

## 2020-12-28 NOTE — Assessment & Plan Note (Signed)
Bilateral pneumonia left greater than right-superimposed on left lower lobe nodularity clinically improved on antibiotics.  Chest x-ray last month showed decreased infiltrates in the left lower lobe.  We will repeat chest x-ray today.  And will need follow-up CT scan and 8 weeks.  Patient is at risk for possible aspiration with his underlying Parkinson's.  Currently not complaining of any dysphagia or choking.  If ongoing symptoms or recurrent pneumonia consider setting up for a modified barium swallow/speech evaluation for dysphagia  Plan  Patient Instructions  Chest xray today .  Set up CT chest without contrast in 8 weeks Restart CPAP At bedtime , wear all night long .   Do not eat 3 hrs before bedtimes. Stay upright for 2 hrs after eating .  Follow up with Dr. Patsey Berthold in 2 months and As needed   Please contact office for sooner follow up if symptoms do not improve or worsen or seek emergency care

## 2020-12-28 NOTE — Progress Notes (Signed)
$'@Patient'z$  ID: Johnathan Arnold., male    DOB: November 19, 1945, 75 y.o.   MRN: [id number]  Chief Complaint  Patient presents with   Follow-up    Referring provider: [name], MD  HPI: 75 year old male never smoker with underlying Parkinson's disease being followed for left lower lobe nodularity, pneumonia and sleep apnea  TEST/EVENTS :  CT chest July 01, 2018 2011 mm left lower lobe nodule, right scattered pulmonary nodules less than 5 mm  PET scan July 16, 2018 1.2 cm left lower lobe pulmonary nodule maximum SUV of 1.9, low-grade activity significantly less than blood pool.  Indicative of benign etiology.  CT scan November 15, 2018 stable with 1.1 cm left lower lobe nodule  VQ scan March 03, 2019 negative for acute or chronic PE  CT chest December 2020 multiple bilateral pulmonary nodules stable  CT chest November 18, 2019 cluster of small nodular opacities left lower lobe base slightly more prominent but less prominent than initially noted on January 2020.  November 21, 2020 CT chest bilateral pulmonary infiltrates most notable in the left lower lobe with associated left sided parapneumonic effusion, borderline mediastinal and hilar lymph nodes.   12/28/2020 Follow up : Aspiration Pnuemonia , OSA , Left lower lung nodules  Patient presents for a 1 month follow-up.  Patient is being followed over the last 2 years for left lower lobe nodularity.  Also had stable right-sided pulmonary nodules measuring less than 5 mm.  Serial CT chest showed stability up until last month.  Patient has been having difficulty with swallowing.  CT chest showed bilateral pulmonary infiltrates most notable in the left lower lobe with associated parapneumonic effusion.  Borderline lymph nodes.  Patient was treated with antibiotics including Augmentin and azithromycin. Concern for possible aspiration pneumonia as he has parkinson.  Follow-up chest x-ray on November 28, 2020 showed persistent but improved left  basilar infiltrate and small left effusion. We discussed today that he will need a follow-up CT chest in 8 weeks. Since last visit patient is feeling well, No increased cough or congestion . Has occasional dry cough and drainage in throat .  Denies dysphagia. Eats well. No choking episodes.   Patient is on CPAP for underlying sleep apnea.  Patient says has been having trouble wearing his CPAP since he has been sick over the last month.  We discussed importance of CPAP compliance and potential complications of untreated sleep apnea.  Trying to stay active , goes to exercise class 3 days a week.      No Known Allergies  Immunization History  Administered Date(s) Administered   Fluad Quad(high Dose 65+) 03/09/2019   Influenza Inj Mdck Quad Pf 03/01/2020   Influenza Split 02/19/2011, 04/10/2014   Influenza, High Dose Seasonal PF 04/01/2013, 02/28/2015, 05/17/2018   Influenza,inj,Quad PF,6+ Mos 03/10/2016, 03/13/2017   Influenza-Unspecified 03/09/2012   PFIZER(Purple Top)SARS-COV-2 Vaccination 07/15/2019, 08/05/2019, 04/01/2020   Pneumococcal Conjugate-13 06/30/2013   Pneumococcal Polysaccharide-23 05/21/2011, 08/06/2016   Tdap 12/19/2010   Zoster, Live 05/09/2012    Past Medical History:  Diagnosis Date   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 30 tablet 11   carbidopa-levodopa (SINEMET CR) 50-200 MG tablet Take 1 tablet by mouth at bedtime.  90 tablet 2   carbidopa-levodopa (SINEMET IR) 25-100 MG tablet Take 2 tablets by mouth 3 (three) times daily. 540 tablet 0   carvedilol (COREG) 6.25 MG tablet TAKE ONE TABLET BY MOUTH TWICE A DAY 180 tablet 1   clonazePAM (KLONOPIN) 0.5 MG tablet TAKE ONE AND ONE-HALF TABLETS BY MOUTH EVERY NIGHT AT BEDTIME 45 tablet 0   isosorbide mononitrate (IMDUR) 60 MG 24 hr tablet Take 1 tablet (60 mg total) by mouth daily. 90 tablet 1   losartan (COZAAR) 25 MG tablet Take 1 tablet (25 mg total) by mouth daily. 90 tablet 1   Multiple Vitamin (MULTIVITAMIN) tablet Take 1 tablet by mouth daily.     nitroGLYCERIN (NITROSTAT) 0.4 MG SL tablet Place 1 tablet (0.4 mg total) under the tongue every 5 (five) minutes as needed for chest pain. 25 tablet 1   omeprazole (PRILOSEC) 20 MG capsule TAKE ONE CAPSULE BY MOUTH EVERY MORNING 90 capsule 0   polyethylene glycol (MIRALAX / GLYCOLAX) packet Take 17 g by mouth daily.      ranolazine (RANEXA) 1000 MG SR tablet TAKE ONE TABLET BY MOUTH TWICE A DAY 60 tablet 1   rosuvastatin (CRESTOR) 10 MG tablet Take 1 tablet (10 mg total) by mouth daily. 90 tablet 3   tamsulosin (FLOMAX) 0.4 MG CAPS capsule TAKE ONE CAPSULE BY MOUTH DAILY 90 capsule 3   furosemide (LASIX) 20 MG tablet Take 1 tablet (20 mg total) by mouth daily. 90 tablet 3   No facility-administered medications prior to visit.     Review of Systems:   Constitutional:   No  weight loss, night sweats,  Fevers, chills, fatigue, or  lassitude.  HEENT:   No headaches,  Difficulty swallowing,  Tooth/dental problems, or  Sore throat,                No sneezing, itching, ear ache, nasal congestion, post nasal drip,   CV:  No chest pain,  Orthopnea, PND, swelling in lower extremities, anasarca, dizziness, palpitations, syncope.   GI  No heartburn, indigestion, abdominal pain, nausea, vomiting, diarrhea, change in bowel habits, loss of appetite, bloody stools.   Resp:   No chest wall deformity  Skin: no rash or  lesions.  GU: no dysuria, change in color of urine, no urgency or frequency.  No flank pain, no hematuria   MS:  No joint pain or swelling.  No decreased range of motion.  No back pain.    Physical Exam  BP 110/60 (BP Location: Left Arm, Patient Position: Sitting, Cuff Size: Normal)   Pulse 99   Temp (!) 97.5 F (36.4 C) (Oral)   Ht '5\' 6"'$  (1.676 m)   Wt 173 lb (78.5 kg)   SpO2 99%   BMI 27.92 kg/m   GEN: A/Ox3; pleasant , NAD, well nourished    HEENT:  [place]/AT,  NOSE-clear, THROAT-clear, no lesions, no postnasal drip or exudate noted.   NECK:  Supple w/ fair ROM; no JVD; normal carotid impulses w/o bruits; no thyromegaly or nodules palpated; no lymphadenopathy.    RESP  Clear  P & A; w/o, wheezes/  rales/ or rhonchi. no accessory muscle use, no dullness to percussion  CARD:  RRR, no m/r/g, no peripheral edema, pulses intact, no cyanosis or clubbing.  GI:   Soft & nt; nml bowel sounds; no organomegaly or masses detected.   Musco: Warm bil, no deformities or joint swelling noted.   Neuro: alert, no focal deficits noted.    Skin: Warm, no lesions or rashes    Lab Results:  CBC    Component Value Date/Time   WBC 3.9 (L) 12/20/2020 1257   RBC 3.17 (L) 12/20/2020 1257   HGB 10.8 (L) 12/20/2020 1257   HGB 11.8 (L) 01/14/2018 1031   HCT 31.4 (L) 12/20/2020 1257   HCT 33.8 (L) 01/14/2018 1031   PLT 182.0 12/20/2020 1257   PLT 162 01/14/2018 1031   MCV 99.1 12/20/2020 1257   MCV 96 01/14/2018 1031   MCH 33.0 11/23/2020 1141   MCHC 34.3 12/20/2020 1257   RDW 15.4 12/20/2020 1257   RDW 13.2 01/14/2018 1031   LYMPHSABS 1.8 12/20/2020 1257   MONOABS 0.2 12/20/2020 1257   EOSABS 0.0 12/20/2020 1257   BASOSABS 0.0 12/20/2020 1257    BMET    Component Value Date/Time   NA 136 12/20/2020 1257   NA 142 01/14/2018 1031   NA 138 03/15/2014 1005   K 4.9 12/20/2020 1257   K 4.4 03/15/2014 1005   CL 101 12/20/2020 1257   CL 108 (H) 03/15/2014 1005   CO2 29 12/20/2020  1257   CO2 30 03/15/2014 1005   GLUCOSE 89 12/20/2020 1257   GLUCOSE 110 (H) 03/15/2014 1005   BUN 14 12/20/2020 1257   BUN 16 01/14/2018 1031   BUN 12 03/15/2014 1005   CREATININE 1.76 (H) 12/20/2020 1257   CREATININE 1.76 (H) 02/20/2017 1045   CALCIUM 9.0 12/20/2020 1257   CALCIUM 9.1 03/15/2014 1005   GFRNONAA 32 (L) 11/23/2020 1141   GFRNONAA 38 (L) 02/20/2017 1045   GFRAA 43 (L) 03/31/2019 1236   GFRAA 44 (L) 02/20/2017 1045    BNP No results found for: BNP  ProBNP No results found for: PROBNP  Imaging:    No flowsheet data found.  No results found for: NITRICOXIDE      Assessment & Plan:   Sleep apnea in adult We discussed CPAP compliance.  Patient education on healthy sleep regimen   Plan  Patient Instructions  Chest xray today .  Set up CT chest without contrast in 8 weeks Restart CPAP At bedtime , wear all night long .   Do not eat 3 hrs before bedtimes. Stay upright for 2 hrs after eating .  Follow up with Dr. Patsey Berthold in 2 months and As needed   Please contact office for sooner follow up if symptoms do not improve or worsen or seek emergency care         Lung nodule Left lower lobe nodularity will need follow-up CT chest in 8 weeks.  Pneumonia Bilateral pneumonia left greater than right-superimposed on left lower lobe nodularity clinically improved on antibiotics.  Chest x-ray last month showed decreased infiltrates in the left lower lobe.  We will repeat chest x-ray today.  And will need follow-up CT scan and 8 weeks.  Patient is at risk for possible aspiration with his underlying Parkinson's.  Currently not complaining of any dysphagia or choking.  If ongoing symptoms or recurrent pneumonia consider setting up for a modified barium swallow/speech evaluation for dysphagia  Plan  Patient Instructions  Chest xray today .  Set up CT chest without contrast in 8 weeks Restart CPAP At bedtime , wear all night long .   Do not eat 3 hrs before  bedtimes. Stay upright for 2 hrs after eating .  Follow up with Dr. Patsey Berthold in 2 months and As needed   Please contact office for sooner follow up if symptoms do not improve or worsen or seek emergency care           Rexene Edison, NP 12/28/2020

## 2020-12-28 NOTE — Telephone Encounter (Signed)
Patient returned phone call. Pt is not currently using his cpap machine nothing further needed.

## 2021-01-01 ENCOUNTER — Ambulatory Visit
Admission: RE | Admit: 2021-01-01 | Discharge: 2021-01-01 | Disposition: A | Discharge status: Home / Self Care | Payer: Medicare Other | Source: Ambulatory Visit | Attending: Adult Health | Admitting: Adult Health

## 2021-01-01 DIAGNOSIS — J690 Pneumonitis due to inhalation of food and vomit: Secondary | ICD-10-CM | POA: Diagnosis not present

## 2021-01-01 DIAGNOSIS — J189 Pneumonia, unspecified organism: Secondary | ICD-10-CM | POA: Diagnosis not present

## 2021-01-03 ENCOUNTER — Other Ambulatory Visit: Payer: Self-pay

## 2021-01-03 ENCOUNTER — Other Ambulatory Visit: Payer: Self-pay | Admitting: Neurology

## 2021-01-03 NOTE — Telephone Encounter (Signed)
Dr. Doristine Devoid patient

## 2021-01-04 ENCOUNTER — Other Ambulatory Visit: Payer: Self-pay | Admitting: Neurology

## 2021-01-21 ENCOUNTER — Telehealth: Payer: Self-pay | Admitting: Neurology

## 2021-01-21 DIAGNOSIS — N1832 Chronic kidney disease, stage 3b: Secondary | ICD-10-CM | POA: Diagnosis not present

## 2021-01-21 DIAGNOSIS — R809 Proteinuria, unspecified: Secondary | ICD-10-CM | POA: Diagnosis not present

## 2021-01-21 DIAGNOSIS — I10 Essential (primary) hypertension: Secondary | ICD-10-CM | POA: Diagnosis not present

## 2021-01-21 DIAGNOSIS — R600 Localized edema: Secondary | ICD-10-CM | POA: Diagnosis not present

## 2021-01-21 DIAGNOSIS — I272 Pulmonary hypertension, unspecified: Secondary | ICD-10-CM | POA: Diagnosis not present

## 2021-01-21 NOTE — Telephone Encounter (Signed)
Patient called and moved his appointment for follow up from November to 02/19/21.   He said his PD symptoms are getting worse.

## 2021-01-21 NOTE — Telephone Encounter (Signed)
You can put him in Thursday at 9:45 but tell him to make sure he comes to appt.  He no showed a work in appt in June (but i'm not sure that he knew about it because PCP made it/requested it)

## 2021-01-21 NOTE — Telephone Encounter (Signed)
Called and lMOM  to see if patient could come in to appt  on 01-24-21 '@9'$ :45

## 2021-01-22 NOTE — Telephone Encounter (Signed)
I got patient schedule for 01-24-21 @ 9:45 do we need to cancel his sept appt with you?

## 2021-01-23 NOTE — Progress Notes (Signed)
Assessment/Plan:   1.  Parkinsons Disease, with worsening of symptoms in June when he had pneumonia  -he looks good today but overall doesn't feel as good.  He will slightly increase his carbidopa/levodopa 25/100 to 2 at 8am, 2 at 11am, 2 at 2 pm, 1 at 5pm  -Continue carbidopa/levodopa 50/200 CR at bedtime.  -We discussed that it used to be thought that levodopa would increase risk of melanoma but now it is believed that Parkinsons itself likely increases risk of melanoma. he is to get regular skin checks.  -He and I discussed how systemic illness can influence Parkinson's disease.  It does not worsen the disease itself, but can bring out symptoms.   2.  RLS/RBD  -Continue clonazepam 0.5 mg, 1.5 tablets q hs.  Understands addictive postential.  PDMP reviewed  -discussed using bed rails  -melatonin 3 mg at bedtime.   3.  Sleep apnea  -On CPAP.  4.  Pulmonary hypertension  -Follows with cardiology and pulmonary  5  MCI  -pt with neurocog testing in Jan and demonstrated only MCI.  No evidence of a mood disorder.    6.  Gait change/weakness, improving after tx for pneumonia but not gone  -discussed with patient that rarely does Parkinsons Disease sx's decline acutely  -discussed with patient that all acute weakness (he could not get OOB) should be evaluated in ER.  8.  Pneumonia, possibly aspiration  -Following with pulmonary.  They stated to hold off on MBE given patient has no complaints of dysphagia.  9.  Bradycardia.    -has upcoming appt with cardiology.  Pt to keep track of BP and pulse.    -sent message to cardiology to make him aware Subjective:   Johnathan Arnold. was seen today in follow up for Parkinsons disease.  My previous records were reviewed prior to todays visit as well as outside records available to me. pts wife with patient and supplements the hx.  patient last seen May 24.  Patient called in on June 1 stating that he felt terrible and could not get up.   It was so bad that they had to go to the neighbor's house to help him get out of the bed.  I had just seen him the week prior, so I knew this was very unusual for him.  His wife has significant speech delay and when we called back, she would not let us speak to him, so ultimately called 911 for a well check.  Patient declined to be taken to the hospital initially, but did go the following day.  He was seen in the emergency room on June 2.  Emergency room records indicate that strength was 5/5 in the upper and lower extremities and that "patellar reflexes intact."  Emergency room records indicate that the patient stated he felt that he was back to baseline and was able to ambulate in the room unassisted.  Physician records also state "as I was discussing his findings, he does tell me that this happened to him before and no one has ever been able to really figure out what happened."  He did call here and asked for a follow-up, but of asked him to follow-up with his primary care first, as I was concerned that something much more than Parkinson's disease was going on.  Primary care ended up ordering an EMG and MRI of the lumbar spine.  Patient was using a Rollator in the office.  MRI lumbar spine was  completed on 11/15/20 and demonstrated multilevel degen changes but no significant CCS. there was compression deformity, chronic, at L1.  There was disc bulging.  Interestingly, his thoracic spine x-ray was suspicious for pneumonia and chest x-ray was recommended.  It was done the following day, and confirmed LLL pneumonia with L paraneumonic effusion.  Pt f/u with pulmonology (I also spoke with pulmonary) and they were worried about aspiration pneumonia.  Patient was worked in here at the request of his primary care physician on June 13 because of weakness, but he did not showed up to that appointment.  He did email me in July to state that he had run out of his levodopa because he had been taking extra since he had not been  doing as well.  He went up to 2 tablets 3 times per day.  He just saw his primary care physician, Dr. Derrel Nip, back on July 14 and she accurately wrote in the note "suspect that his left lower lobe pneumonia superimposed over his Parkinson's disease and worsening anemia of CKD was the cause" of leg weakness.  Last saw pulmonary on December 28, 2020.  Records reviewed.  They did not order a modified barium swallow because the patient is not complaining of any dysphagia or choking.  They stated that if patient had another episode of pneumonia, they would order it.  Pt states that he overall still feels weak.  He is dragging the R leg more.  He has some good days - "I had a good day yesterday and today but the day before was bad."  He has trouble writing now.  Went to the beach 2 weeks ago and was getting up from the loose sand and fell.  Pt states that he talked with Dr. Derrel Nip about PT and they were supposed to set it up but they never called him.    Current prescribed movement disorder medications: Carbidopa/levodopa 25/100, 2 tablets 3 times per day (8am/noon/5pm) - has wearing off about 30 min prior Carbidopa/levodopa 50/200 CR at bedtime  Clonazepam, 0.5 mg, 1.5 tablets nightly     ALLERGIES:  No Known Allergies  CURRENT MEDICATIONS:  Outpatient Encounter Medications as of 01/24/2021  Medication Sig   aspirin EC 81 MG tablet Take 1 tablet (81 mg total) by mouth at bedtime.   carbidopa-levodopa (SINEMET CR) 50-200 MG tablet Take 1 tablet by mouth at bedtime.   carbidopa-levodopa (SINEMET IR) 25-100 MG tablet Take 2 tablets by mouth 3 (three) times daily.   carvedilol (COREG) 6.25 MG tablet TAKE ONE TABLET BY MOUTH TWICE A DAY   clonazePAM (KLONOPIN) 0.5 MG tablet TAKE 1 AND 1/2 TABLET BY MOUTH EVERY NIGHT AT BEDTIME   isosorbide mononitrate (IMDUR) 60 MG 24 hr tablet Take 1 tablet (60 mg total) by mouth daily.   losartan (COZAAR) 25 MG tablet Take 1 tablet (25 mg total) by mouth daily.   Multiple  Vitamin (MULTIVITAMIN) tablet Take 1 tablet by mouth daily.   nitroGLYCERIN (NITROSTAT) 0.4 MG SL tablet Place 1 tablet (0.4 mg total) under the tongue every 5 (five) minutes as needed for chest pain.   omeprazole (PRILOSEC) 20 MG capsule TAKE ONE CAPSULE BY MOUTH EVERY MORNING   polyethylene glycol (MIRALAX / GLYCOLAX) packet Take 17 g by mouth daily.    ranolazine (RANEXA) 1000 MG SR tablet TAKE ONE TABLET BY MOUTH TWICE A DAY   rosuvastatin (CRESTOR) 10 MG tablet Take 1 tablet (10 mg total) by mouth daily.   tamsulosin (FLOMAX) 0.4  MG CAPS capsule TAKE ONE CAPSULE BY MOUTH DAILY   furosemide (LASIX) 20 MG tablet Take 1 tablet (20 mg total) by mouth daily.   No facility-administered encounter medications on file as of 01/24/2021.    Objective:   PHYSICAL EXAMINATION:    VITALS:   Vitals:   01/24/21 0937  BP: (!) 145/39  Pulse: 76  SpO2: 99%  Weight: 176 lb 6.4 oz (80 kg)  Height: '5\' 6"'$  (1.676 m)     GEN:  The patient appears stated age and is in NAD. HEENT:  Normocephalic, atraumatic.  The mucous membranes are moist. The superficial temporal arteries are without ropiness or tenderness.  CV:  Brady at 44bpm.  Regular Lungs: CTAB  Neurological examination:  Orientation: The patient is alert and oriented x3. Cranial nerves: There is good facial symmetry with facial hypomimia. The speech is fluent and clear. Soft palate rises symmetrically and there is no tongue deviation. Hearing is intact to conversational tone. Sensation: Sensation is intact to light touch throughout Motor: Strength is at least antigravity x4.  Movement examination: Tone: There is nl tone in the ue/le Abnormal movements: there is intermittent RUE rest tremor Coordination:  There is no decremation with RAM's, with any form of RAMS, including alternating supination and pronation of the forearm, hand opening and closing, finger taps, heel taps and toe taps. Gait and Station: The patient has no difficulty  arising out of a deep-seated chair without the use of the hands. The patient's stride length is good.  Good arm swing and no dragging of the legs today    Total time spent on today's visit was 32 minutes, including both face-to-face time and nonface-to-face time.  Time included that spent on review of records (prior notes available to me/labs/imaging if pertinent), discussing treatment and goals, answering patient's questions and coordinating care.  Cc:  [name], MD

## 2021-01-24 ENCOUNTER — Other Ambulatory Visit: Payer: Self-pay

## 2021-01-24 ENCOUNTER — Ambulatory Visit (INDEPENDENT_AMBULATORY_CARE_PROVIDER_SITE_OTHER): Payer: Medicare Other | Admitting: Neurology

## 2021-01-24 ENCOUNTER — Other Ambulatory Visit: Payer: Medicare Other

## 2021-01-24 ENCOUNTER — Other Ambulatory Visit (INDEPENDENT_AMBULATORY_CARE_PROVIDER_SITE_OTHER): Payer: Medicare Other

## 2021-01-24 ENCOUNTER — Encounter: Payer: Self-pay | Admitting: Neurology

## 2021-01-24 VITALS — BP 145/39 | HR 44 | Ht 66.0 in | Wt 176.4 lb

## 2021-01-24 DIAGNOSIS — I208 Other forms of angina pectoris: Secondary | ICD-10-CM | POA: Diagnosis not present

## 2021-01-24 DIAGNOSIS — E1121 Type 2 diabetes mellitus with diabetic nephropathy: Secondary | ICD-10-CM

## 2021-01-24 DIAGNOSIS — E7800 Pure hypercholesterolemia, unspecified: Secondary | ICD-10-CM

## 2021-01-24 DIAGNOSIS — D631 Anemia in chronic kidney disease: Secondary | ICD-10-CM

## 2021-01-24 DIAGNOSIS — D709 Neutropenia, unspecified: Secondary | ICD-10-CM

## 2021-01-24 DIAGNOSIS — N1832 Chronic kidney disease, stage 3b: Secondary | ICD-10-CM

## 2021-01-24 DIAGNOSIS — N183 Chronic kidney disease, stage 3 unspecified: Secondary | ICD-10-CM

## 2021-01-24 DIAGNOSIS — G20 Parkinson's disease: Secondary | ICD-10-CM

## 2021-01-24 DIAGNOSIS — D696 Thrombocytopenia, unspecified: Secondary | ICD-10-CM

## 2021-01-24 LAB — CBC WITH DIFFERENTIAL/PLATELET
Basophils Absolute: 0 10*3/uL (ref 0.0–0.1)
Basophils Relative: 0.3 % (ref 0.0–3.0)
Eosinophils Absolute: 0 10*3/uL (ref 0.0–0.7)
Eosinophils Relative: 1.3 % (ref 0.0–5.0)
HCT: 31.5 % — ABNORMAL LOW (ref 39.0–52.0)
Hemoglobin: 10.8 g/dL — ABNORMAL LOW (ref 13.0–17.0)
Lymphocytes Relative: 47.4 % — ABNORMAL HIGH (ref 12.0–46.0)
Lymphs Abs: 1.7 10*3/uL (ref 0.7–4.0)
MCHC: 34.5 g/dL (ref 30.0–36.0)
MCV: 100.3 fl — ABNORMAL HIGH (ref 78.0–100.0)
Monocytes Absolute: 0.2 10*3/uL (ref 0.1–1.0)
Monocytes Relative: 5.4 % (ref 3.0–12.0)
Neutro Abs: 1.6 10*3/uL (ref 1.4–7.7)
Neutrophils Relative %: 45.6 % (ref 43.0–77.0)
Platelets: 135 10*3/uL — ABNORMAL LOW (ref 150.0–400.0)
RBC: 3.14 Mil/uL — ABNORMAL LOW (ref 4.22–5.81)
RDW: 14.2 % (ref 11.5–15.5)
WBC: 3.5 10*3/uL — ABNORMAL LOW (ref 4.0–10.5)

## 2021-01-24 LAB — LIPID PANEL
Cholesterol: 122 mg/dL (ref 0–200)
HDL: 62.8 mg/dL (ref >39.00)
LDL Cholesterol: 37 mg/dL (ref 0–99)
NonHDL: 59.61
Total CHOL/HDL Ratio: 2
Triglycerides: 111 mg/dL (ref 0.0–149.0)
VLDL: 22.2 mg/dL (ref 0.0–40.0)

## 2021-01-24 LAB — HEMOGLOBIN A1C: Hgb A1c MFr Bld: 5.3 % (ref 4.6–6.5)

## 2021-01-24 MED ORDER — CARBIDOPA-LEVODOPA 25-100 MG PO TABS: 2.0000 | ORAL_TABLET | Freq: Three times a day (TID) | ORAL | 1 refills | Status: DC

## 2021-01-24 NOTE — Patient Instructions (Addendum)
increase his carbidopa/levodopa 25/100 to 2 at 8am, 2 at 11am, 2 at 2 pm, 1 at 5pm Continue carbidopa/levodopa 50/200 at bedtime Take your blood pressure and pulse at home I sent a referral to [facility] therapy.

## 2021-01-25 ENCOUNTER — Telehealth: Payer: Self-pay | Admitting: *Deleted

## 2021-01-25 MED ORDER — CARVEDILOL 6.25 MG PO TABS: 3.1250 mg | ORAL_TABLET | Freq: Two times a day (BID) | ORAL | 0 refills | Status: DC

## 2021-01-25 NOTE — Telephone Encounter (Signed)
Called and spoke with patient per [name] request. Instructed him to decrease his carvedilol to 3.125 mg twice a day. Currently he takes Carvedilol 6.25 mg twice a day and advised he can cut that pill in half (3.125 mg) twice a day. He verbalized understanding of instructions with no further questions at this time

## 2021-01-25 NOTE — Telephone Encounter (Signed)
-----   Message from Wellington Hampshire, MD sent at 01/25/2021 12:43 PM EDT -----    ----- Message ----- From: Ludwig Clarks, DO Sent: 01/24/2021  12:52 PM EDT To: Wellington Hampshire, MD  Hi!  I see the patient for Parkinsons Disease.  He c/o feeling worse/fatigued since recent pneumonia.  May just be the recovery but wanted to let you know his pulse was in the 40's when I saw him (was 59 when my nurse took it but 44 by the time I got in there and took it manually).  He has come in bradycardic before but that low is unusual so wanted to let you know.  He has an upcoming appt with you.  Thanks!  Wells Guiles

## 2021-01-25 NOTE — Telephone Encounter (Signed)
Author: Wellington Hampshire, MD Service: Cardiology Author Type: Physician  Filed: 01/25/2021 12:45 PM Encounter Date: 01/24/2021 Status: Signed  Editor: Wellington Hampshire, MD (Physician)  Thanks for letting me know. I am forwarding this to our nurses to decrease carvedilol to 3.125 mg twice daily.

## 2021-01-25 NOTE — Progress Notes (Signed)
Thanks for letting me know. I am forwarding this to our nurses to decrease carvedilol to 3.125 mg twice daily.

## 2021-01-29 ENCOUNTER — Other Ambulatory Visit: Payer: Self-pay

## 2021-01-29 ENCOUNTER — Ambulatory Visit: Payer: Medicare Other | Attending: Neurology | Admitting: Physical Therapy

## 2021-01-29 VITALS — BP 162/50 | HR 47

## 2021-01-29 DIAGNOSIS — M545 Low back pain, unspecified: Secondary | ICD-10-CM | POA: Diagnosis not present

## 2021-01-29 DIAGNOSIS — M6281 Muscle weakness (generalized): Secondary | ICD-10-CM | POA: Diagnosis not present

## 2021-01-29 DIAGNOSIS — D696 Thrombocytopenia, unspecified: Secondary | ICD-10-CM | POA: Insufficient documentation

## 2021-01-29 DIAGNOSIS — R262 Difficulty in walking, not elsewhere classified: Secondary | ICD-10-CM | POA: Diagnosis not present

## 2021-01-29 DIAGNOSIS — R2681 Unsteadiness on feet: Secondary | ICD-10-CM | POA: Diagnosis not present

## 2021-01-29 DIAGNOSIS — D709 Neutropenia, unspecified: Secondary | ICD-10-CM | POA: Insufficient documentation

## 2021-01-29 DIAGNOSIS — G8929 Other chronic pain: Secondary | ICD-10-CM | POA: Diagnosis not present

## 2021-01-29 NOTE — Progress Notes (Signed)
Y Your white blood cell count and your platelets are both low.  Your anemia is stable and has been attributed thus far to your kidney disease. But now that all three cell lines are showing a change   I would like you to see a hematologist to make sure there is no issue with your bone marrow's ability to produce these types of cells   If you agree to the referral I will make it.   Regards,   Deborra Medina, MD

## 2021-01-29 NOTE — Assessment & Plan Note (Signed)
With neutropenia and stable anemia.  Hematology referral recommended to rule out MDS

## 2021-01-29 NOTE — Addendum Note (Signed)
Addended by: [name] on: 01/29/2021 07:46 PM   Modules accepted: Orders

## 2021-01-29 NOTE — Therapy (Signed)
[place] MAIN [facility] SERVICES [address] [place], [place], [address] Phone: [phone]   Fax:  [phone]  Physical Therapy Evaluation  Patient Details  Name: Johnathan Arn. MRN: [id number] Date of Birth: May 09, 1946 Referring Provider (PT): Dr. Carles Collet   Encounter Date: 01/29/2021   PT End of Session - 01/29/21 0948     Visit Number 1    Number of Visits 17    Date for PT Re-Evaluation 03/26/21    Authorization Type Medicare; start of care 01/29/21    PT Start Time 0832    PT Stop Time 0935    PT Time Calculation (min) 63 min    Equipment Utilized During Treatment Gait belt    Activity Tolerance Patient tolerated treatment well    Behavior During Therapy [facility] for tasks assessed/performed             Past Medical History:  Diagnosis Date   3-vessel coronary artery disease    s/p  5 vessel CABG   Diabetes mellitus without complication ([place])    History of cardiac catheterization 2011   [place]   Hyperlipidemia    Hypertension    Hypertriglyceridemia    Parkinson's disease ([place])    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 LIMA to LAD, SVG to D1, OM1 and RPDA   CORONARY ARTERY BYPASS GRAFT  03/1998   5 vessel, [facility]   RIGHT HEART CATH N/A 04/04/2019   Procedure: RIGHT HEART CATH;  Surgeon: Wellington Hampshire, MD;  Location: [place] [facility];  Service: Cardiovascular;  Laterality: N/A;   RIGHT/LEFT HEART CATH AND CORONARY ANGIOGRAPHY N/A 02/01/2018   Procedure: RIGHT/LEFT HEART CATH AND CORONARY ANGIOGRAPHY;  Surgeon: Wellington Hampshire, MD;  Location: [place] [facility];  Service: Cardiovascular;  Laterality: N/A;    Vitals:   01/29/21 0859  BP: (!) 162/50  Pulse: (!) 47      Subjective Assessment - 01/29/21 0837     Subjective "I just don't have any strength in my legs."    Patient is accompained by:  Family member   wife "wendy" present;   Pertinent History 75 yo Male presents to therapy with history of Parkinson's Disease. He reports in June 2022 he got out of bed one morning and his legs just quit working. He was unable to get up and move around. He reports progressive weakness and not feeling well for about 4-5 weeks. He reports his legs feel weak and he is unable to walk as far. He reports soreness in calf. Patient presents to therapy without AD. he reports he used a cane for a few weeks but doesn't use it anymore. He reports falling a few weeks ago while at the beach. He reports he lost his balance in the sand. He reports 6 weeks ago he was on the portch swing and when he stood up he tried to get his balance and must have passed out. He denies any significant injuries with each fall. He did see his neurologist last week who increased his carbiopa/levadopa. He reports he has been doing okay with increased medication; He reports having pneumonia but wasn't aware and neurologist believes that exacerbated his Parkinson's Disease. He is doing [employer] which he does 3 days a week; He does have a resting tremor in RUE;  He also reports  difficulty writing; He reports difficulty buttoning a button but other dressing is not difficult. In addition he has a history of chronic low back pain which he states has been increasing in center of low back. He reports his back pain worse with standing and activity and alleviates with sitting;    Limitations Standing;Walking    How long can you sit comfortably? no difficulty; sitting alleviates pain;    How long can you stand comfortably? 10-15 min    How long can you walk comfortably? 5-10 min, worse with carrying something and with walking;    Diagnostic tests MRI in June 2022 Multilevel degenerative changes without stenosis;    Patient Stated Goals "Improve back pain, increase strength in legs and arms"    Currently in Pain? No/denies    Multiple Pain Sites  No                [facility] PT Assessment - 01/29/21 0001       Assessment   Medical Diagnosis Parkinson's Disease/Weakness    Referring Provider (PT) Dr. Carles Collet    Onset Date/Surgical Date --   June 2022   Hand Dominance Right    Next MD Visit --   in 6 months   Prior Therapy had PT last year with good results; denies any PT recently;      Precautions   Precautions Fall    Required Braces or Orthoses --   none     Restrictions   Weight Bearing Restrictions No      Balance Screen   Has the patient fallen in the past 6 months Yes    How many times? 2    Has the patient had a decrease in activity level because of a fear of falling?  No    Is the patient reluctant to leave their home because of a fear of falling?  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      Cognition   Overall Cognitive Status Within Functional Limits for tasks assessed    Memory --   reports decreased short term memory     Observation/Other Assessments   Focus on Therapeutic Outcomes (FOTO)  58%      Sensation   Light Touch Appears Intact      Coordination   Gross Motor Movements are Fluid and Coordinated Yes    Fine Motor Movements are Fluid and Coordinated Yes      Posture/Postural Control   Posture Comments In standing exhibits slouched posture with moderate forward head and rounded shoulders, increased thoracic kyphosis and reduced lordosis; has increased pain with repeated flexion in standing due to tightness in hamstrings;      ROM / Strength   AROM / PROM / Strength AROM;Strength;PROM      AROM   Overall AROM Comments has discomfort in  right groin with hip flexion;      PROM   Overall PROM Comments mild tightness in hamstrings, approximately 50-60 degrees with tightness reported;      Strength   Overall Strength Comments UE: shoulder  grossly 4-/5, elbow 4/5,    Strength Assessment Site Hip;Knee;Ankle    Right/Left Hip Right;Left    Right Hip Flexion 3-/5    Right Hip ABduction 4-/5    Right Hip ADduction 4-/5    Left Hip Flexion 4-/5    Left Hip ABduction 4-/5    Left Hip ADduction 4-/5    Right/Left Knee Right;Left    Right Knee Flexion 4/5    Right Knee Extension 4/5    Right/Left Ankle Right;Left    Right Ankle Dorsiflexion 3/5    Right Ankle Plantar Flexion 3/5    Left Ankle Dorsiflexion 3/5    Left Ankle Plantar Flexion 3/5      Special Tests    Special Tests Lumbar    Lumbar Tests Straight Leg Raise;FABER test      FABER test   findings Negative    Side --   bilaterally     Straight Leg Raise   Findings Negative    Side  --   bilaterally     Bed Mobility   Bed Mobility --   independent     Transfers   Comments able to transfer sit<>Stand without pushing on arm rests      Ambulation/Gait   Gait Comments pt ambulates with reciprocal gait pattern, normal base of support. exhibits forward slumped posture; patient does report occasional shuffling feet especially on RLE and does walk with slower pace      Standardized Balance Assessment   Standardized Balance Assessment Five Times Sit to Stand;10 meter walk test;Timed Up and Go Test    Five times sit to stand comments  14.82 with arms across chest (<15 sec indicates low fall risk)    10 Meter Walk 0.92 m/s without AD      Timed Up and Go Test   Normal TUG (seconds) 11.2    Manual TUG (seconds) 10.2    Cognitive TUG (seconds) 12.2    TUG Comments without AD      High Level Balance   High Level Balance Comments able to stand unsupported, no sway or imbalance;                        Objective measurements completed on  examination: See above findings.  Will address HEP next session; patient is exercising 3x a week with [employer] for Parkinson's Disease;              PT Education - 01/29/21 0948     Education Details plan of care    Person(s) Educated Patient    Methods Explanation    Comprehension Verbalized understanding              PT Short Term Goals - 01/29/21 1018       PT SHORT TERM GOAL #1   Title Patient will be adherent to HEP at least 3x a week to improve functional strength and balance for better safety at home.    Time 4    Period Weeks    Status New    Target Date 02/26/21      PT SHORT TERM GOAL #2   Title Patient will improve BUE gross strength to 4/5 to improve functional strength for lifting/carrying groceries    Time 4    Period Weeks    Status New               PT Long Term Goals - 01/29/21 1020  PT LONG TERM GOAL #1   Title Patient will increase BLE gross strength to 4+/5 as to improve functional strength for independent gait, increased standing tolerance and increased ADL ability.    Time 8    Period Weeks    Status New    Target Date 03/26/21      PT LONG TERM GOAL #2   Title Patient will report a worst pain of 3/10 on VAS in low back  to improve tolerance with ADLs and reduced symptoms with activities.    Time 8    Period Weeks    Status New    Target Date 03/26/21      PT LONG TERM GOAL #3   Title Patient will tolerate standing/walking for at least 30 min with a maximum of 3/10 back pain to exhibit improved tolerance with ADLs such as cooking/cleaning, grocery shopping.    Time 8    Period Weeks    Status New    Target Date 03/26/21      PT LONG TERM GOAL #4   Title Patient will increase 10 meter walk test to >1.64ms as to improve gait speed for better community ambulation and to reduce fall risk.    Time 8    Period Weeks    Status New    Target Date 03/26/21      PT LONG TERM GOAL #5   Title Patient will  improve FOTO score by at least 5% to indicate improved functional mobility with ADLs.    Time 8    Period Weeks    Status New    Target Date 03/26/21      Additional Long Term Goals   Additional Long Term Goals Yes      PT LONG TERM GOAL #6   Title Patient will increase six minute walk test distance to >1300 for progression to community ambulator and improve gait ability close to age group norms.    Time 8    Period Weeks    Status New    Target Date 03/26/21                    Plan - 01/29/21 0949     Clinical Impression Statement 75yo Male presents to therapy with new onset weakness since June 2022. Patient reports waking up and having a hard time standing/walking. He was found to have pneumonia which was unknown to him. Patient has Parkinson's Disease and he believes the pneumonia caused an exacerbation of UE/LE weakness. He has a resting tremor in RUE. He reports this can worsen to tremor in LUE at times. Patient denies any freezing. 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 Patient is currently ambulating without an assistive device. He does walk slightly slower than community ambulator. He is able to transfer sit<>stand from chair without pushing on arm rests. Patient does report difficulty getting up from low seat like recliner or sofa. He does exhibit weakness in BUE and BLE. His leg weakness is more significant in hip and ankle. Ankle instability likely contributes to imbalance. Formal balance assessment deferred due to time constraints. Patient does exhibit forward slouched posture and reports increased low back pain. He reported increased pain with repeated flexion in standing with tightness in hamstrings. Patient reports less pain in hooklying position when supine. Vitals assessed and BP a little elevated today. Patient does monitor at home. Will continue to monitor within  session. He does  have bradycardia and PCP referred to cardiology; Patient would benefit from skilled PT intervention to improve postural re-education to help alleviate back pain and improve UE/LE strength for overall mobility;    Personal Factors and Comorbidities Comorbidity 3+;Age    Comorbidities Parkinson's disease, DM x2 (well controlled), chronic low back pain; s/p CABG x5, CKD III    Examination-Activity Limitations Locomotion Level;Squat;Stairs;Stand;Transfers    Examination-Participation Restrictions Community Activity;Shop;Volunteer;Yard Work    Merchant [employer] officer Evolving/Moderate complexity    Clinical Decision Making Moderate    Rehab Potential Good    PT Frequency 2x / week    PT Duration 8 weeks    PT Treatment/Interventions Cryotherapy;Electrical Stimulation;Moist Heat;Gait training;Stair training;Functional mobility training;Therapeutic activities;Therapeutic exercise;Balance training;Neuromuscular re-education;Patient/family education;Manual techniques;Passive range of motion;Energy conservation    PT Next Visit Plan Formal balance assessment (consider FGA or tandem/SLS); initiate UE/LE strengthening; consider 6 min walk    PT Home Exercise Plan will address next session;    Consulted and Agree with Plan of Care Patient             Patient will benefit from skilled therapeutic intervention in order to improve the following deficits and impairments:  Decreased balance, Decreased endurance, Decreased mobility, Difficulty walking, Decreased activity tolerance, Decreased strength, Postural dysfunction, Pain  Visit Diagnosis: Muscle weakness (generalized)  Difficulty in walking, not elsewhere classified  Unsteadiness on feet  Chronic low back pain, unspecified back pain laterality, unspecified whether sciatica present     Problem List Patient Active Problem List   Diagnosis Date Noted   Pneumonia 12/28/2020   Hyponatremia 12/22/2020   Bilateral leg weakness  12/20/2020   Prostate cancer screening 09/08/2020   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[place]) 09/06/2020   Low back pain of over 3 months duration 08/19/2019   Pulmonary hypertension (HCC)    Insomnia 12/21/2018   Sleep apnea in adult 12/09/2018   Periodic limb movement disorder 12/09/2018   Abnormal findings on diagnostic imaging of lung 07/09/2018   Lung nodule 07/09/2018   Pulmonary nodules 05/18/2018   Wears hearing aid in both ears 05/17/2018   Leg pain, bilateral 02/20/2018   Dyspnea    Effort angina (HCC)    CKD (chronic kidney disease) stage 3, GFR 30-59 ml/min (HCC) 09/21/2017   History of skin cancer in adulthood 08/14/2016   Parkinson's disease ([place]) 08/09/2016   Bilateral carotid artery stenosis 04/02/2015   Vertigo, peripheral 10/17/2014   Benign prostatic hypertrophy with urinary frequency 01/31/2014   Encounter for Medicare annual wellness exam 07/02/2013   Obesity 04/03/2013   Other malaise and fatigue 09/21/2012   Hyperlipidemia    Hypertension    3-vessel coronary artery disease    S/P CABG x 5    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 MRN: [id number] Date of Birth: May 26, 1946

## 2021-01-31 ENCOUNTER — Ambulatory Visit (INDEPENDENT_AMBULATORY_CARE_PROVIDER_SITE_OTHER): Payer: Medicare Other

## 2021-01-31 ENCOUNTER — Other Ambulatory Visit: Payer: Self-pay

## 2021-01-31 DIAGNOSIS — R0600 Dyspnea, unspecified: Secondary | ICD-10-CM | POA: Diagnosis not present

## 2021-01-31 DIAGNOSIS — R0609 Other forms of dyspnea: Secondary | ICD-10-CM

## 2021-01-31 LAB — ECHOCARDIOGRAM COMPLETE
AR max vel: 2.22 cm2
AV Area VTI: 2.19 cm2
AV Area mean vel: 1.91 cm2
AV Mean grad: 8 mmHg
AV Peak grad: 13.7 mmHg
Ao pk vel: 1.85 m/s
Area-P 1/2: 4.93 cm2
S' Lateral: 2.8 cm
Single Plane A4C EF: 56.4 %

## 2021-02-01 ENCOUNTER — Telehealth: Payer: Self-pay | Admitting: Medical

## 2021-02-01 ENCOUNTER — Encounter: Payer: Self-pay | Admitting: Physical Therapy

## 2021-02-01 ENCOUNTER — Ambulatory Visit: Payer: Medicare Other | Admitting: Physical Therapy

## 2021-02-01 VITALS — BP 148/47

## 2021-02-01 DIAGNOSIS — R2681 Unsteadiness on feet: Secondary | ICD-10-CM

## 2021-02-01 DIAGNOSIS — R262 Difficulty in walking, not elsewhere classified: Secondary | ICD-10-CM | POA: Diagnosis not present

## 2021-02-01 DIAGNOSIS — M545 Low back pain, unspecified: Secondary | ICD-10-CM | POA: Diagnosis not present

## 2021-02-01 DIAGNOSIS — M6281 Muscle weakness (generalized): Secondary | ICD-10-CM

## 2021-02-01 DIAGNOSIS — G8929 Other chronic pain: Secondary | ICD-10-CM | POA: Diagnosis not present

## 2021-02-01 NOTE — Telephone Encounter (Signed)
The patient was at [facility] for PT today so he walked over to the office after receiving my message.  I have reviewed his echocardiogram results with him. He questioned if he needed to remain on the lasix, which was the main reason for doing the echocardiogram.  I advised with his elevated pulmonary artery pressure, he should continue his lasix as he is currently taking. The patient voices understanding and is agreeable.

## 2021-02-01 NOTE — Telephone Encounter (Signed)
Attempted to call the patient at his home & cell #'s. No answer- I left a message to please call back.

## 2021-02-01 NOTE — Therapy (Signed)
[place] MAIN [facility] SERVICES [address] [place], [place], [address] Phone: [phone]   Fax:  [phone]  Physical Therapy Treatment  Patient Details  Name: Johnathan Arnold. MRN: [id number] Date of Birth: 1945/11/12 Referring Provider (PT): Dr. Carles Collet   Encounter Date: 02/01/2021   PT End of Session - 02/01/21 1124     Visit Number 2    Number of Visits 17    Date for PT Re-Evaluation 03/26/21    Authorization Type Medicare; start of care 01/29/21    PT Start Time [id number]    PT Stop Time [id number]    PT Time Calculation (min) 44 min    Equipment Utilized During Treatment Gait belt    Activity Tolerance Patient tolerated treatment well    Behavior During Therapy [facility] for tasks assessed/performed             Past Medical History:  Diagnosis Date   3-vessel coronary artery disease    s/p  5 vessel CABG   Diabetes mellitus without complication ([place])    History of cardiac catheterization 2011   [place]   Hyperlipidemia    Hypertension    Hypertriglyceridemia    Parkinson's disease ([place])    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 LIMA to LAD, SVG to D1, OM1 and RPDA   CORONARY ARTERY BYPASS GRAFT  03/1998   5 vessel, [facility]   RIGHT HEART CATH N/A 04/04/2019   Procedure: RIGHT HEART CATH;  Surgeon: Wellington Hampshire, MD;  Location: [place] [facility];  Service: Cardiovascular;  Laterality: N/A;   RIGHT/LEFT HEART CATH AND CORONARY ANGIOGRAPHY N/A 02/01/2018   Procedure: RIGHT/LEFT HEART CATH AND CORONARY ANGIOGRAPHY;  Surgeon: Wellington Hampshire, MD;  Location: [place] [facility];  Service: Cardiovascular;  Laterality: N/A;    Vitals:   02/01/21 0852  BP: (!) 148/47     Subjective Assessment - 02/01/21 1123     Subjective Pt reports no significant changes sicne last time. Does report his blood pressure is not very  consistent but above value is within the normal limits of the range of BP values he is accustomed to.    Pertinent History 75 yo Male presents to therapy with history of Parkinson's Disease. He reports in June 2022 he got out of bed one morning and his legs just quit working. He was unable to get up and move around. He reports progressive weakness and not feeling well for about 4-5 weeks. He reports his legs feel weak and he is unable to walk as far. He reports soreness in calf. Patient presents to therapy without AD. he reports he used a cane for a few weeks but doesn't use it anymore. He reports falling a few weeks ago while at the beach. He reports he lost his balance in the sand. He reports 6 weeks ago he was on the portch swing and when he stood up he tried to get his balance and must have passed out. He denies any significant injuries with each fall. He did see his neurologist last week who increased his carbiopa/levadopa. He reports he has been doing okay with increased medication; He reports having pneumonia but wasn't aware and neurologist believes that exacerbated his Parkinson's Disease. He is doing [employer] which he does 3 days a week; He does have a  resting tremor in RUE;  He also reports difficulty writing; He reports difficulty buttoning a button but other dressing is not difficult. In addition he has a history of chronic low back pain which he states has been increasing in center of low back. He reports his back pain worse with standing and activity and alleviates with sitting;    Limitations Standing;Walking    How long can you sit comfortably? no difficulty; sitting alleviates pain;    How long can you stand comfortably? 10-15 min    How long can you walk comfortably? 5-10 min, worse with carrying something and with walking;    Diagnostic tests MRI in June 2022 Multilevel degenerative changes without stenosis;    Patient Stated Goals "Improve back pain, increase strength in legs  and arms"    Currently in Pain? No/denies                [facility] PT Assessment - 02/01/21 0001       Standardized Balance Assessment   Standardized Balance Assessment Dynamic Gait Index      Dynamic Gait Index   Level Surface Normal    Change in Gait Speed Normal    Gait with Horizontal Head Turns Mild Impairment    Gait with Vertical Head Turns Normal    Gait and Pivot Turn Normal    Step Over Obstacle Moderate Impairment   pt stepped on box but did not result in LOB   Step Around Obstacles Normal    Steps Mild Impairment    Total Score 20            Treatment this session. Nustep level 4 x 4 min for aerobic priming  TRX squat and Row 2 x 10 each  - cues for min use of UE with TRX squat, following cues pt felt more LE muscle activation PWR! Up 2 x 10 from seated position- Added to HEP but not listed on official HEP below -cues for proper UE movement   The following activities were completed in parallel bars or at balance bar  Heel Raises / toes raises with UE support 3 x 15  -utilizition of hands in order to get improved DF muscle activation  Sidestepping with Red T band around ankles 2 x 10 bidirectional - no UE use but performed in front of balance bar in case of LOB  Pt educated regarding HEP and all questions answered.  HEP access:  [email or web address] Access Code: [email or web address]                   PT Education - 02/01/21 1124     Education Details HEP provided    Person(s) Educated Patient    Methods Explanation    Comprehension Verbalized understanding              PT Short Term Goals - 01/29/21 1018       PT SHORT TERM GOAL #1   Title Patient will be adherent to HEP at least 3x a week to improve functional strength and balance for better safety at home.    Time 4    Period Weeks    Status New    Target Date 02/26/21      PT SHORT TERM GOAL #2   Title Patient will improve BUE gross strength to 4/5 to improve functional  strength for lifting/carrying groceries    Time 4    Period Weeks    Status New  PT Long Term Goals - 01/29/21 1020       PT LONG TERM GOAL #1   Title Patient will increase BLE gross strength to 4+/5 as to improve functional strength for independent gait, increased standing tolerance and increased ADL ability.    Time 8    Period Weeks    Status New    Target Date 03/26/21      PT LONG TERM GOAL #2   Title Patient will report a worst pain of 3/10 on VAS in low back  to improve tolerance with ADLs and reduced symptoms with activities.    Time 8    Period Weeks    Status New    Target Date 03/26/21      PT LONG TERM GOAL #3   Title Patient will tolerate standing/walking for at least 30 min with a maximum of 3/10 back pain to exhibit improved tolerance with ADLs such as cooking/cleaning, grocery shopping.    Time 8    Period Weeks    Status New    Target Date 03/26/21      PT LONG TERM GOAL #4   Title Patient will increase 10 meter walk test to >1.2ms as to improve gait speed for better community ambulation and to reduce fall risk.    Time 8    Period Weeks    Status New    Target Date 03/26/21      PT LONG TERM GOAL #5   Title Patient will improve FOTO score by at least 5% to indicate improved functional mobility with ADLs.    Time 8    Period Weeks    Status New    Target Date 03/26/21      Additional Long Term Goals   Additional Long Term Goals Yes      PT LONG TERM GOAL #6   Title Patient will increase six minute walk test distance to >1300 for progression to community ambulator and improve gait ability close to age group norms.    Time 8    Period Weeks    Status New    Target Date 03/26/21                   Plan - 02/01/21 1131     Clinical Impression Statement Continued with current plan of care as laid out in evaluation and recent prior sessions. Pt remains motivated to advance progress toward goals in order to maximize  independence and safety at home. Completed DGI assessment today and pt impairments with stepping over obstacles and with head turns while walking but otherwise was able to complete without significant difficulty. Will consider future intervention regarding stepping and attention to objects in his path. Pt will continue to benefit from skilled physical therapy program in order to improve QOL, physical function and to achieve his therapy goals.    Personal Factors and Comorbidities Comorbidity 3+;Age    Comorbidities Parkinson's disease, DM x2 (well controlled), chronic low back pain; s/p CABG x5, CKD III    Examination-Activity Limitations Locomotion Level;Squat;Stairs;Stand;Transfers    Examination-Participation Restrictions Community Activity;Shop;Volunteer;Yard Work    SMerchant navy officerEvolving/Moderate complexity    Clinical Decision Making Moderate    Rehab Potential Good    PT Frequency 2x / week    PT Duration 8 weeks    PT Treatment/Interventions Cryotherapy;Electrical Stimulation;Moist Heat;Gait training;Stair training;Functional mobility training;Therapeutic activities;Therapeutic exercise;Balance training;Neuromuscular re-education;Patient/family education;Manual techniques;Passive range of motion;Energy conservation    PT Next Visit Plan Continue strength training,  review HEP, add PWR! moves as indicated, incorporate balance intervention as indicated    PT Home Exercise Plan Provided 02/01/21    Consulted and Agree with Plan of Care Patient             Patient will benefit from skilled therapeutic intervention in order to improve the following deficits and impairments:  Decreased balance, Decreased endurance, Decreased mobility, Difficulty walking, Decreased activity tolerance, Decreased strength, Postural dysfunction, Pain  Visit Diagnosis: Muscle weakness (generalized)  Difficulty in walking, not elsewhere classified  Unsteadiness on feet     Problem  List Patient Active Problem List   Diagnosis Date Noted   Neutropenia ([place]) 01/29/2021   Thrombocytopenia ([place]) 01/29/2021   Pneumonia 12/28/2020   Hyponatremia 12/22/2020   Bilateral leg weakness 12/20/2020   Prostate cancer screening 09/08/2020   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[place]) 09/06/2020   Low back pain of over 3 months duration 08/19/2019   Pulmonary hypertension ([place])    Insomnia 12/21/2018   Sleep apnea in adult 12/09/2018   Periodic limb movement disorder 12/09/2018   Abnormal findings on diagnostic imaging of lung 07/09/2018   Lung nodule 07/09/2018   Pulmonary nodules 05/18/2018   Wears hearing aid in both ears 05/17/2018   Leg pain, bilateral 02/20/2018   Dyspnea    Effort angina (H[facility])    CKD (chronic kidney disease) stage 3, GFR 30-59 ml/min ([place]) 09/21/2017   History of skin cancer in adulthood 08/14/2016   Parkinson's disease ([place]) 08/09/2016   Bilateral carotid artery stenosis 04/02/2015   Vertigo, peripheral 10/17/2014   Benign prostatic hypertrophy with urinary frequency 01/31/2014   Encounter for Medicare annual wellness exam 07/02/2013   Obesity 04/03/2013   Other malaise and fatigue 09/21/2012   Hyperlipidemia    Hypertension    3-vessel coronary artery disease    S/P CABG x 5    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 [place], [place], [address] Phone: [phone]   Fax:  [phone]  Name: Fabio Horath. MRN: [id number] Date of Birth: 15-Aug-1945

## 2021-02-01 NOTE — Telephone Encounter (Signed)
Cadence Ninfa Meeker, PA-C  01/31/2021  1:45 PM EDT     Echo showed normal pump function, mildly dilated left atrium, moderately elevated pulmonary artery pressure

## 2021-02-02 ENCOUNTER — Other Ambulatory Visit: Payer: Self-pay | Admitting: Internal Medicine

## 2021-02-04 ENCOUNTER — Other Ambulatory Visit: Payer: Self-pay | Admitting: Internal Medicine

## 2021-02-05 ENCOUNTER — Ambulatory Visit: Payer: Medicare Other | Admitting: Physical Therapy

## 2021-02-05 ENCOUNTER — Other Ambulatory Visit: Payer: Self-pay

## 2021-02-05 DIAGNOSIS — M6281 Muscle weakness (generalized): Secondary | ICD-10-CM | POA: Diagnosis not present

## 2021-02-05 DIAGNOSIS — R262 Difficulty in walking, not elsewhere classified: Secondary | ICD-10-CM | POA: Diagnosis not present

## 2021-02-05 DIAGNOSIS — G8929 Other chronic pain: Secondary | ICD-10-CM

## 2021-02-05 DIAGNOSIS — M545 Low back pain, unspecified: Secondary | ICD-10-CM

## 2021-02-05 DIAGNOSIS — R2681 Unsteadiness on feet: Secondary | ICD-10-CM | POA: Diagnosis not present

## 2021-02-05 NOTE — Therapy (Addendum)
[place] MAIN [facility] SERVICES [address] [place], [place], [address] Phone: [phone]   Fax:  [phone]  Physical Therapy Treatment  Patient Details  Name: Johnathan Arnold. MRN: [id number] Date of Birth: 1946-04-06 Referring Provider (PT): Dr. Carles Collet   Encounter Date: 02/05/2021   PT End of Session - 02/05/21 1237     Visit Number 3    Number of Visits 17    Date for PT Re-Evaluation 03/26/21    Authorization Type Medicare; start of care 01/29/21    PT Start Time 1140    PT Stop Time 1222    PT Time Calculation (min) 42 min    Equipment Utilized During Treatment Gait belt    Activity Tolerance Patient tolerated treatment well    Behavior During Therapy [facility] for tasks assessed/performed             Past Medical History:  Diagnosis Date   3-vessel coronary artery disease    s/p  5 vessel CABG   Diabetes mellitus without complication ([place])    History of cardiac catheterization 2011   [place]   Hyperlipidemia    Hypertension    Hypertriglyceridemia    Parkinson's disease ([place])    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 LIMA to LAD, SVG to D1, OM1 and RPDA   CORONARY ARTERY BYPASS GRAFT  03/1998   5 vessel, [facility]   RIGHT HEART CATH N/A 04/04/2019   Procedure: RIGHT HEART CATH;  Surgeon: Wellington Hampshire, MD;  Location: [place] [facility];  Service: Cardiovascular;  Laterality: N/A;   RIGHT/LEFT HEART CATH AND CORONARY ANGIOGRAPHY N/A 02/01/2018   Procedure: RIGHT/LEFT HEART CATH AND CORONARY ANGIOGRAPHY;  Surgeon: Wellington Hampshire, MD;  Location: [place] [facility];  Service: Cardiovascular;  Laterality: N/A;    There were no vitals filed for this visit.   Subjective Assessment - 02/05/21 1235     Subjective Pt reports no significant changes sicne last time. Reports HEP has been going well.    Pertinent  History 75 yo Male presents to therapy with history of Parkinson's Disease. He reports in June 2022 he got out of bed one morning and his legs just quit working. He was unable to get up and move around. He reports progressive weakness and not feeling well for about 4-5 weeks. He reports his legs feel weak and he is unable to walk as far. He reports soreness in calf. Patient presents to therapy without AD. he reports he used a cane for a few weeks but doesn't use it anymore. He reports falling a few weeks ago while at the beach. He reports he lost his balance in the sand. He reports 6 weeks ago he was on the portch swing and when he stood up he tried to get his balance and must have passed out. He denies any significant injuries with each fall. He did see his neurologist last week who increased his carbiopa/levadopa. He reports he has been doing okay with increased medication; He reports having pneumonia but wasn't aware and neurologist believes that exacerbated his Parkinson's Disease. He is doing [employer] which he does 3 days a week; He does have a resting tremor in RUE;  He also reports difficulty writing; He reports difficulty buttoning a button but other dressing is not difficult. In addition  he has a history of chronic low back pain which he states has been increasing in center of low back. He reports his back pain worse with standing and activity and alleviates with sitting;    Limitations Standing;Walking    How long can you sit comfortably? no difficulty; sitting alleviates pain;    How long can you stand comfortably? 10-15 min    How long can you walk comfortably? 5-10 min, worse with carrying something and with walking;    Diagnostic tests MRI in June 2022 Multilevel degenerative changes without stenosis;    Patient Stated Goals "Improve back pain, increase strength in legs and arms"    Currently in Pain? No/denies    Multiple Pain Sites No            Therex Octane level 4 x 4 min  for aerobic priming   There-Act TRX Squat and Row 2 x 10 each  - cues for min use of UE with TRX squat, following cues pt felt more LE muscle activation  PWR! Up 2 x 10 from seated position -Pt performed exercise well without need for VC or TC   Therex: Leg press - 2 x 10 50 lb - reports weight "about right"   Leg press for plantarflexion 70 lb 2 x 10- pt reports difficult and on the heavy side but he was able to complete   Neuro Re-ed: Step taps on airex - to second step (12 inch), foot cought on firrst several attempts but with increased repetitions pt corected this   The following activities were completed in parallel bars or at balance bar   Heel Raises / toes raises with UE support 2 x 15  -utilizition of hands in order to get improved DF muscle activation   Sidestepping with Red T band around ankles 2 x 10 bidirectional - UE use in front of balance bar in case of LOB  Head turns on airex in semi-tandem stance 2 x 30 sec each  1/2 foam roller step over x 10 leading with ea LE -first few attempts pt had trouble with adequate step length but following a couple repetitions pt was able to step over with improved efficacy                          PT Education - 02/05/21 1236     Education Details Continue with hep    Person(s) Educated Patient    Methods Explanation    Comprehension Verbalized understanding              PT Short Term Goals - 01/29/21 1018       PT SHORT TERM GOAL #1   Title Patient will be adherent to HEP at least 3x a week to improve functional strength and balance for better safety at home.    Time 4    Period Weeks    Status New    Target Date 02/26/21      PT SHORT TERM GOAL #2   Title Patient will improve BUE gross strength to 4/5 to improve functional strength for lifting/carrying groceries    Time 4    Period Weeks    Status New               PT Long Term Goals - 01/29/21 1020       PT LONG TERM GOAL  #1   Title Patient will increase BLE gross strength to 4+/5 as to improve  functional strength for independent gait, increased standing tolerance and increased ADL ability.    Time 8    Period Weeks    Status New    Target Date 03/26/21      PT LONG TERM GOAL #2   Title Patient will report a worst pain of 3/10 on VAS in low back  to improve tolerance with ADLs and reduced symptoms with activities.    Time 8    Period Weeks    Status New    Target Date 03/26/21      PT LONG TERM GOAL #3   Title Patient will tolerate standing/walking for at least 30 min with a maximum of 3/10 back pain to exhibit improved tolerance with ADLs such as cooking/cleaning, grocery shopping.    Time 8    Period Weeks    Status New    Target Date 03/26/21      PT LONG TERM GOAL #4   Title Patient will increase 10 meter walk test to >1.47ms as to improve gait speed for better community ambulation and to reduce fall risk.    Time 8    Period Weeks    Status New    Target Date 03/26/21      PT LONG TERM GOAL #5   Title Patient will improve FOTO score by at least 5% to indicate improved functional mobility with ADLs.    Time 8    Period Weeks    Status New    Target Date 03/26/21      Additional Long Term Goals   Additional Long Term Goals Yes      PT LONG TERM GOAL #6   Title Patient will increase six minute walk test distance to >1300 for progression to community ambulator and improve gait ability close to age group norms.    Time 8    Period Weeks    Status New    Target Date 03/26/21                   Plan - 02/05/21 1238   Clinical Impression Statement: Continued with current plan of care as laid out in evaluation and recent prior sessions. Pt remains motivated to advance progress toward goals in order to maximize independence and safety at home. Pt requires high level assistance and cuing for completion of exercises in order to provide adequate level of stimulation and perturbation.  Author allows pt as much opportunity as possible to perform independent righting strategies, only stepping in when pt is unable to prevent falling to floor. Pt continues to demonstrate progress toward goals AEB progression of some interventions this date either in volume or intensity.   Personal Factors and Comorbidities Comorbidity 3+;Age    Comorbidities Parkinson's disease, DM x2 (well controlled), chronic low back pain; s/p CABG x5, CKD III    Examination-Activity Limitations Locomotion Level;Squat;Stairs;Stand;Transfers    Examination-Participation Restrictions Community Activity;Shop;Volunteer;Yard Work    SMerchant navy officerEvolving/Moderate complexity    Clinical Decision Making Moderate    Rehab Potential Good    PT Frequency 2x / week    PT Duration 8 weeks    PT Treatment/Interventions Cryotherapy;Electrical Stimulation;Moist Heat;Gait training;Stair training;Functional mobility training;Therapeutic activities;Therapeutic exercise;Balance training;Neuromuscular re-education;Patient/family education;Manual techniques;Passive range of motion;Energy conservation    PT Next Visit Plan Continue strength training, review HEP, add PWR! moves as indicated, incorporate balance intervention as indicated    PT Home Exercise Plan Provided 02/01/21    Consulted and Agree with Plan of Care Patient  Patient will benefit from skilled therapeutic intervention in order to improve the following deficits and impairments:  Decreased balance, Decreased endurance, Decreased mobility, Difficulty walking, Decreased activity tolerance, Decreased strength, Postural dysfunction, Pain  Visit Diagnosis: Muscle weakness (generalized)  Difficulty in walking, not elsewhere classified  Unsteadiness on feet  Chronic low back pain, unspecified back pain laterality, unspecified whether sciatica present  Chronic left-sided low back pain without sciatica     Problem List Patient  Active Problem List   Diagnosis Date Noted   Neutropenia ([place]) 01/29/2021   Thrombocytopenia ([place]) 01/29/2021   Pneumonia 12/28/2020   Hyponatremia 12/22/2020   Bilateral leg weakness 12/20/2020   Prostate cancer screening 09/08/2020   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[place]) 09/06/2020   Low back pain of over 3 months duration 08/19/2019   Pulmonary hypertension ([place])    Insomnia 12/21/2018   Sleep apnea in adult 12/09/2018   Periodic limb movement disorder 12/09/2018   Abnormal findings on diagnostic imaging of lung 07/09/2018   Lung nodule 07/09/2018   Pulmonary nodules 05/18/2018   Wears hearing aid in both ears 05/17/2018   Leg pain, bilateral 02/20/2018   Dyspnea    Effort angina ([facility])    CKD (chronic kidney disease) stage 3, GFR 30-59 ml/min ([place]) 09/21/2017   History of skin cancer in adulthood 08/14/2016   Parkinson's disease ([place]) 08/09/2016   Bilateral carotid artery stenosis 04/02/2015   Vertigo, peripheral 10/17/2014   Benign prostatic hypertrophy with urinary frequency 01/31/2014   Encounter for Medicare annual wellness exam 07/02/2013   Obesity 04/03/2013   Other malaise and fatigue 09/21/2012   Hyperlipidemia    Hypertension    3-vessel coronary artery disease    S/P CABG x 5    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 [place], [place], [address] Phone: [phone]   Fax:  [phone]  Name: Rashard Shutt. MRN: [id number] Date of Birth: [birth year]

## 2021-02-07 ENCOUNTER — Inpatient Hospital Stay: Payer: Medicare Other

## 2021-02-07 ENCOUNTER — Inpatient Hospital Stay: Payer: Medicare Other | Attending: Oncology | Admitting: Oncology

## 2021-02-07 ENCOUNTER — Other Ambulatory Visit: Payer: Self-pay

## 2021-02-07 VITALS — BP 141/55 | HR 53 | Temp 97.8°F | Resp 20 | Wt 178.0 lb

## 2021-02-07 DIAGNOSIS — D7589 Other specified diseases of blood and blood-forming organs: Secondary | ICD-10-CM | POA: Diagnosis not present

## 2021-02-07 DIAGNOSIS — D72819 Decreased white blood cell count, unspecified: Secondary | ICD-10-CM

## 2021-02-07 DIAGNOSIS — D649 Anemia, unspecified: Secondary | ICD-10-CM

## 2021-02-07 DIAGNOSIS — D61818 Other pancytopenia: Secondary | ICD-10-CM | POA: Insufficient documentation

## 2021-02-07 DIAGNOSIS — D696 Thrombocytopenia, unspecified: Secondary | ICD-10-CM | POA: Diagnosis not present

## 2021-02-07 LAB — CBC
HCT: 32.7 % — ABNORMAL LOW (ref 39.0–52.0)
Hemoglobin: 11 g/dL — ABNORMAL LOW (ref 13.0–17.0)
MCH: 34.2 pg — ABNORMAL HIGH (ref 26.0–34.0)
MCHC: 33.6 g/dL (ref 30.0–36.0)
MCV: 101.6 fL — ABNORMAL HIGH (ref 80.0–100.0)
Platelets: 140 10*3/uL — ABNORMAL LOW (ref 150–400)
RBC: 3.22 MIL/uL — ABNORMAL LOW (ref 4.22–5.81)
RDW: 13 % (ref 11.5–15.5)
WBC: 4.1 10*3/uL (ref 4.0–10.5)
nRBC: 0 % (ref 0.0–0.2)

## 2021-02-07 LAB — IRON AND TIBC
Iron: 102 ug/dL (ref 45–182)
Saturation Ratios: 31 % (ref 17.9–39.5)
TIBC: 330 ug/dL (ref 250–450)
UIBC: 228 ug/dL

## 2021-02-07 LAB — FOLATE: Folate: 22 ng/mL (ref >5.9)

## 2021-02-07 LAB — VITAMIN B12: Vitamin B-12: 382 pg/mL (ref 180–914)

## 2021-02-07 LAB — LACTATE DEHYDROGENASE: LDH: 136 U/L (ref 98–192)

## 2021-02-07 LAB — FERRITIN: Ferritin: 74 ng/mL (ref 24–336)

## 2021-02-07 NOTE — Progress Notes (Signed)
Referred here for "bad blood work." Pt reports weight loss of 15lb from June to July but has appetite has since improved. No other concerns at this time.

## 2021-02-08 ENCOUNTER — Encounter: Payer: Self-pay | Admitting: Physical Therapy

## 2021-02-08 ENCOUNTER — Ambulatory Visit: Payer: Medicare Other | Attending: Neurology | Admitting: Physical Therapy

## 2021-02-08 DIAGNOSIS — R918 Other nonspecific abnormal finding of lung field: Secondary | ICD-10-CM | POA: Diagnosis not present

## 2021-02-08 DIAGNOSIS — R262 Difficulty in walking, not elsewhere classified: Secondary | ICD-10-CM

## 2021-02-08 DIAGNOSIS — R2681 Unsteadiness on feet: Secondary | ICD-10-CM | POA: Diagnosis not present

## 2021-02-08 DIAGNOSIS — M6281 Muscle weakness (generalized): Secondary | ICD-10-CM | POA: Diagnosis not present

## 2021-02-08 DIAGNOSIS — G8929 Other chronic pain: Secondary | ICD-10-CM | POA: Diagnosis not present

## 2021-02-08 DIAGNOSIS — M545 Low back pain, unspecified: Secondary | ICD-10-CM | POA: Insufficient documentation

## 2021-02-08 DIAGNOSIS — J690 Pneumonitis due to inhalation of food and vomit: Secondary | ICD-10-CM | POA: Diagnosis present

## 2021-02-08 NOTE — Progress Notes (Signed)
[place]  [phone] [phone]  ID: [name]. OB: 1945/09/07  MR#: [id number]  GPQ#:[id number]  Patient Care Team: Johnathan Mc, MD as PCP - General (Internal Medicine) Wellington Hampshire, MD as PCP - Cardiology (Cardiology) Isaias Cowman, MD (Internal Medicine) Tat, Eustace Quail, DO as Consulting Physician (Neurology)  CHIEF COMPLAINT: Pancytopenia.  INTERVAL HISTORY: Patient is a 75 year old male who was noted to have pancytopenia on routine blood work.  Repeat laboratory work confirmed the results.  He currently feels well and is asymptomatic.  He has no new medications.  He has no neurologic complaints.  He denies any recent fevers or illnesses.  He has a good appetite and denies weight loss.  He denies any chest pain, shortness of breath, cough, or hemoptysis.  He denies any nausea, vomiting, constipation, or diarrhea.  He has no urinary complaints.  Patient feels at his baseline and offers no specific complaints today.  REVIEW OF SYSTEMS:   Review of Systems  Constitutional: Negative.  Negative for fever, malaise/fatigue and weight loss.  Respiratory: Negative.  Negative for cough, hemoptysis and shortness of breath.   Cardiovascular: Negative.  Negative for chest pain and leg swelling.  Gastrointestinal: Negative.  Negative for abdominal pain.  Genitourinary: Negative.  Negative for dysuria.  Musculoskeletal: Negative.  Negative for back pain.  Skin: Negative.  Negative for rash.  Neurological: Negative.  Negative for dizziness, focal weakness, weakness and headaches.  Psychiatric/Behavioral: Negative.  The patient is not nervous/anxious.    As per HPI. Otherwise, a complete review of systems is negative.  PAST MEDICAL HISTORY: Past Medical History:  Diagnosis Date   3-vessel coronary artery disease    s/p  5 vessel CABG   Diabetes mellitus without complication ([facility])    History of cardiac catheterization 2011    [facility]   Hyperlipidemia    Hypertension    Hypertriglyceridemia    Parkinson's disease ([facility])    S/P CABG x 5 11-99   Vertigo     PAST SURGICAL HISTORY: Past Surgical History:  Procedure Laterality Date   CARDIAC CATHETERIZATION  05-19-2010   [facility]: Patent grafts. LIMA to LAD, SVG to D1, OM1 and RPDA   CORONARY ARTERY BYPASS GRAFT  03/1998   5 vessel, [facility]   RIGHT HEART CATH N/A 04/04/2019   Procedure: RIGHT HEART CATH;  Surgeon: Wellington Hampshire, MD;  Location: [facility];  Service: Cardiovascular;  Laterality: N/A;   RIGHT/LEFT HEART CATH AND CORONARY ANGIOGRAPHY N/A 02/01/2018   Procedure: RIGHT/LEFT HEART CATH AND CORONARY ANGIOGRAPHY;  Surgeon: Wellington Hampshire, MD;  Location: [facility];  Service: Cardiovascular;  Laterality: N/A;    FAMILY HISTORY: Family History  Problem Relation Age of Onset   Heart attack Mother 86   Hypertension Mother    Heart attack Father 60   Heart disease Father    Heart disease Brother    Healthy Son     ADVANCED DIRECTIVES (Y/N):  N  HEALTH MAINTENANCE: Social History   Tobacco Use   Smoking status: Never    Passive exposure: Yes   Smokeless tobacco: Never  Vaping Use   Vaping Use: Never used  Substance Use Topics   Alcohol use: Yes    Comment: occasional beer   Drug use: No     Colonoscopy:  PAP:  Bone density:  Lipid panel:  No Known Allergies  Current Outpatient Medications  Medication Sig Dispense Refill   aspirin EC 81  MG tablet Take 1 tablet (81 mg total) by mouth at bedtime. 30 tablet 11   carbidopa-levodopa (SINEMET CR) 50-200 MG tablet Take 1 tablet by mouth at bedtime. 90 tablet 2   carbidopa-levodopa (SINEMET IR) 25-100 MG tablet Take 2 tablets by mouth 3 (three) times daily. 2 at 8am, 2 at 11am, 2 at 2 pm, 1 at 5pm 630 tablet 1   carvedilol (COREG) 6.25 MG tablet Take 0.5 tablets (3.125 mg total) by mouth 2 (two) times daily.  0   clonazePAM (KLONOPIN) 0.5 MG tablet TAKE 1 AND 1/2  TABLET BY MOUTH EVERY NIGHT AT BEDTIME 45 tablet 1   isosorbide mononitrate (IMDUR) 60 MG 24 hr tablet Take 1 tablet (60 mg total) by mouth daily. 90 tablet 1   losartan (COZAAR) 25 MG tablet Take 1 tablet (25 mg total) by mouth daily. 90 tablet 1   Multiple Vitamin (MULTIVITAMIN) tablet Take 1 tablet by mouth daily.     nitroGLYCERIN (NITROSTAT) 0.4 MG SL tablet Place 1 tablet (0.4 mg total) under the tongue every 5 (five) minutes as needed for chest pain. 25 tablet 1   omeprazole (PRILOSEC) 20 MG capsule TAKE ONE CAPSULE BY MOUTH EVERY MORNING 90 capsule 0   polyethylene glycol (MIRALAX / GLYCOLAX) packet Take 17 g by mouth daily.      ranolazine (RANEXA) 1000 MG SR tablet TAKE ONE TABLET BY MOUTH TWICE A DAY 60 tablet 1   rosuvastatin (CRESTOR) 10 MG tablet Take 1 tablet (10 mg total) by mouth daily. 90 tablet 3   tamsulosin (FLOMAX) 0.4 MG CAPS capsule TAKE ONE CAPSULE BY MOUTH DAILY 90 capsule 3   furosemide (LASIX) 20 MG tablet Take 1 tablet (20 mg total) by mouth daily. 90 tablet 3   No current facility-administered medications for this visit.    OBJECTIVE: Vitals:   02/07/21 1507  BP: (!) 141/55  Pulse: (!) 53  Resp: 20  Temp: 97.8 F (36.6 C)  SpO2: 100%     Body mass index is 28.73 kg/m.    ECOG FS:0 - Asymptomatic  General: Well-developed, well-nourished, no acute distress. Eyes: Pink conjunctiva, anicteric sclera. HEENT: Normocephalic, moist mucous membranes. Lungs: No audible wheezing or coughing. Heart: Regular rate and rhythm. Abdomen: Soft, nontender, no obvious distention. Musculoskeletal: No edema, cyanosis, or clubbing. Neuro: Alert, answering all questions appropriately. Cranial nerves grossly intact. Skin: No rashes or petechiae noted. Psych: Normal affect. Lymphatics: No cervical, calvicular, axillary or inguinal LAD.   Arnold RESULTS:  Arnold Results  Component Value Date   NA 136 12/20/2020   K 4.9 12/20/2020   CL 101 12/20/2020   CO2 29 12/20/2020    GLUCOSE 89 12/20/2020   BUN 14 12/20/2020   CREATININE 1.76 (H) 12/20/2020   CALCIUM 9.0 12/20/2020   PROT 7.0 11/08/2020   ALBUMIN 3.0 (L) 11/23/2020   AST 21 11/08/2020   ALT 7 11/08/2020   ALKPHOS 71 11/08/2020   BILITOT 1.5 (H) 11/08/2020   GFRNONAA 32 (L) 11/23/2020   GFRAA 43 (L) 03/31/2019    [facility]old Results  Component Value Date   WBC 4.1 02/07/2021   NEUTROABS 1.6 01/24/2021   HGB 11.0 (L) 02/07/2021   HCT 32.7 (L) 02/07/2021   MCV 101.6 (H) 02/07/2021   PLT 140 (L) 02/07/2021     STUDIES: ECHOCARDIOGRAM COMPLETE  Result Date: 01/31/2021    ECHOCARDIOGRAM REPORT   Patient Name:   Johnathan [facility]. Date of Exam: 01/31/2021 Medical Rec #:  [id number]  Height:       66.0 in Accession #:    [id number]           Weight:       176.4 lb Date of Birth:  12-28-1945             BSA:          1.896 m Patient Age:    75 years             BP:           140/62 mmHg Patient Gender: M                    HR:           56 bpm. Exam Location:  [place] Procedure: 2D Echo, Cardiac Doppler and Color Doppler Indications:    R06.02 SOB  History:        Patient has prior history of Echocardiogram examinations, most                 recent 02/23/2019. CAD, Prior CABG, Pulmonary HTN, Mitral Valve                 Disease, Signs/Symptoms:Shortness of Breath and Chest Pain; Risk                 Factors:Sleep Apnea, Non-Smoker, Hypertension and Diabetes.  Sonographer:    Pilar Jarvis RDMS, RVT, RDCS Referring Phys: [id number] [place]  1. Left ventricular ejection fraction, by estimation, is 60 to 65%. The left ventricle has normal function. The left ventricle has no regional wall motion abnormalities. Left ventricular diastolic parameters are consistent with Grade II diastolic dysfunction (pseudonormalization).  2. Right ventricular systolic function is low normal. The right ventricular size is normal. There is moderately elevated pulmonary artery systolic pressure. The estimated right  ventricular systolic pressure is 69.7 mmHg.  3. Left atrial size was mildly dilated.  4. The mitral valve is normal in structure. Mild mitral valve regurgitation.  5. The aortic valve is tricuspid. Aortic valve regurgitation is not visualized.  6. The inferior vena cava is dilated in size with >50% respiratory variability, suggesting right atrial pressure of 8 mmHg. FINDINGS  Left Ventricle: Left ventricular ejection fraction, by estimation, is 60 to 65%. The left ventricle has normal function. The left ventricle has no regional wall motion abnormalities. The left ventricular internal cavity size was normal in size. There is  no left ventricular hypertrophy. Left ventricular diastolic parameters are consistent with Grade II diastolic dysfunction (pseudonormalization). Right Ventricle: The right ventricular size is normal. No increase in right ventricular wall thickness. Right ventricular systolic function is low normal. There is moderately elevated pulmonary artery systolic pressure. The tricuspid regurgitant velocity  is 3.50 m/s, and with an assumed right atrial pressure of 8 mmHg, the estimated right ventricular systolic pressure is 94.8 mmHg. Left Atrium: Left atrial size was mildly dilated. Right Atrium: Right atrial size was normal in size. Pericardium: There is no evidence of pericardial effusion. Mitral Valve: The mitral valve is normal in structure. Mild mitral valve regurgitation. Tricuspid Valve: The tricuspid valve is normal in structure. Tricuspid valve regurgitation is trivial. Aortic Valve: The aortic valve is tricuspid. Aortic valve regurgitation is not visualized. Aortic valve mean gradient measures 8.0 mmHg. Aortic valve peak gradient measures 13.7 mmHg. Aortic valve area, by VTI measures 2.19 cm. Pulmonic Valve: The pulmonic valve was normal in structure. Pulmonic valve regurgitation is mild to moderate. Aorta: The  aortic root is normal in size and structure. Venous: The inferior vena cava is  dilated in size with greater than 50% respiratory variability, suggesting right atrial pressure of 8 mmHg. IAS/Shunts: No atrial level shunt detected by color flow Doppler.  LEFT VENTRICLE PLAX 2D LVIDd:         4.80 cm      Diastology LVIDs:         2.80 cm      LV e' medial:    4.57 cm/s LV PW:         1.15 cm      LV E/e' medial:  30.2 LV IVS:        0.95 cm      LV e' lateral:   6.31 cm/s LVOT diam:     1.85 cm      LV E/e' lateral: 21.9 LV SV:         98 LV SV Index:   52 LVOT Area:     2.69 cm  LV Volumes (MOD) LV vol d, MOD A4C: 101.0 ml LV vol s, MOD A4C: 44.0 ml LV SV MOD A4C:     101.0 ml RIGHT VENTRICLE            IVC RV Basal diam:  4.00 cm    IVC diam: 2.10 cm RV S prime:     8.70 cm/s TAPSE (M-mode): 1.7 cm LEFT ATRIUM             Index       RIGHT ATRIUM           Index LA Vol (A2C):   67.1 ml 35.39 ml/m RA Area:     16.50 cm LA Vol (A4C):   62.4 ml 32.91 ml/m RA Volume:   37.10 ml  19.57 ml/m LA Biplane Vol: 65.7 ml 34.65 ml/m  AORTIC VALVE                    PULMONIC VALVE AV Area (Vmax):    2.22 cm     PV Vmax:          1.20 m/s AV Area (Vmean):   1.91 cm     PV Peak grad:     5.8 mmHg AV Area (VTI):     2.19 cm     PR End Diast Vel: 3.82 msec AV Vmax:           185.00 cm/s AV Vmean:          130.000 cm/s AV VTI:            0.447 m AV Peak Grad:      13.7 mmHg AV Mean Grad:      8.0 mmHg LVOT Vmax:         153.00 cm/s LVOT Vmean:        92.600 cm/s LVOT VTI:          0.364 m LVOT/AV VTI ratio: 0.81  AORTA Ao Root diam: 2.80 cm Ao Asc diam:  3.10 cm Ao Arch diam: 2.6 cm MITRAL VALVE                TRICUSPID VALVE MV Area (PHT): 4.93 cm     TR Peak grad:   49.0 mmHg MV Decel Time: 154 msec     TR Vmax:        350.00 cm/s MV E velocity: 138.00 cm/s MV A velocity: 98.80 cm/s   SHUNTS MV E/A ratio:  1.40  Systemic VTI:  0.36 m                             Systemic Diam: 1.85 cm Kate Sable MD Electronically signed by Kate Sable MD Signature Date/Time: 01/31/2021/1:37:43 PM     Final     ASSESSMENT: Pancytopenia.  PLAN:    1.  Leukopenia: Resolved.  Patient's white blood cell count is 4.1 today. 2.  Anemia: Patient's hemoglobin is decreased, but stable at 11.0.  There is no evidence of hemolysis.  Iron stores, B12, and folate are all within normal limits.  SPEP and Intelligen myeloid panel were ordered for completeness and are pending at time of dictation.  No intervention is needed at this time.  Patient does not require bone marrow biopsy. 3.  Thrombocytopenia: Mild.  Patient's platelet count is 140.  Laboratory work as above.  Platelet antibody profile is pending at time of dictation. 4.  Macrocytosis: B12 and folate are within normal limits.  Patient may have underlying MDS, but this would require a bone marrow biopsy to diagnose which is not necessary at this point. 5.  Disposition: No interventions are needed at this time.  Patient does not require bone marrow biopsy.  Return to clinic in 4 weeks with video assisted telemedicine visit to discuss the results.  Patient expressed understanding and was in agreement with this plan. He also understands that He can call clinic at any time with any questions, concerns, or complaints.    Lloyd Huger, MD   02/08/2021 11:52 AM

## 2021-02-08 NOTE — Therapy (Signed)
[place] MAIN [facility] SERVICES [address] [place], [place], [address] Phone: [phone]   Fax:  [phone]  Physical Therapy Treatment  Patient Details  Name: Johnathan Arnold. MRN: [id number] Date of Birth: 12/15/1945 Referring Provider (PT): Dr. Carles Collet   Encounter Date: 02/08/2021   PT End of Session - 02/08/21 1202     Visit Number 4    Number of Visits 17    Date for PT Re-Evaluation 03/26/21    Authorization Type Medicare; start of care 01/29/21    Progress Note Due on Visit 10    PT Start Time 1018    PT Stop Time 1100    PT Time Calculation (min) 42 min    Equipment Utilized During Treatment Gait belt    Activity Tolerance Patient tolerated treatment well    Behavior During Therapy WFL for tasks assessed/performed             Past Medical History:  Diagnosis Date   3-vessel coronary artery disease    s/p  5 vessel CABG   Diabetes mellitus without complication ([place])    History of cardiac catheterization 2011   [facility]   Hyperlipidemia    Hypertension    Hypertriglyceridemia    Parkinson's disease ([place])    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 LIMA to LAD, SVG to D1, OM1 and RPDA   CORONARY ARTERY BYPASS GRAFT  03/1998   5 vessel, [facility]   RIGHT HEART CATH N/A 04/04/2019   Procedure: RIGHT HEART CATH;  Surgeon: Wellington Hampshire, MD;  Location: [facility];  Service: Cardiovascular;  Laterality: N/A;   RIGHT/LEFT HEART CATH AND CORONARY ANGIOGRAPHY N/A 02/01/2018   Procedure: RIGHT/LEFT HEART CATH AND CORONARY ANGIOGRAPHY;  Surgeon: Wellington Hampshire, MD;  Location: [facility];  Service: Cardiovascular;  Laterality: N/A;    There were no vitals filed for this visit.   Subjective Assessment - 02/08/21 1201     Subjective Pt reports no significant changes sicne last time. Reports HEP  has been going well. going to rock steady class later this afternoon    Pertinent History 75 yo Male presents to therapy with history of Parkinson's Disease. He reports in June 2022 he got out of bed one morning and his legs just quit working. He was unable to get up and move around. He reports progressive weakness and not feeling well for about 4-5 weeks. He reports his legs feel weak and he is unable to walk as far. He reports soreness in calf. Patient presents to therapy without AD. he reports he used a cane for a few weeks but doesn't use it anymore. He reports falling a few weeks ago while at the beach. He reports he lost his balance in the sand. He reports 6 weeks ago he was on the portch swing and when he stood up he tried to get his balance and must have passed out. He denies any significant injuries with each fall. He did see his neurologist last week who increased his carbiopa/levadopa. He reports he has been doing okay with increased medication; He reports having pneumonia but wasn't aware and neurologist believes that exacerbated his Parkinson's Disease. He is doing [employer] which he does 3 days a week; He does have a resting tremor in RUE;  He also  reports difficulty writing; He reports difficulty buttoning a button but other dressing is not difficult. In addition he has a history of chronic low back pain which he states has been increasing in center of low back. He reports his back pain worse with standing and activity and alleviates with sitting;    Limitations Standing;Walking    How long can you sit comfortably? no difficulty; sitting alleviates pain;    How long can you stand comfortably? 10-15 min    How long can you walk comfortably? 5-10 min, worse with carrying something and with walking;    Diagnostic tests MRI in June 2022 Multilevel degenerative changes without stenosis;    Patient Stated Goals "Improve back pain, increase strength in legs and arms"    Currently in Pain?  No/denies               Treatment provided this session  Therex:  Octane level 4 x 4 min for aerobic priming   There act:  TRX Squat with HR and Row 2 x 10 each  - HR added squat to promote quicj and powerful movement  TRX lunge with shoulder HABD  -incorporating stretch for UE to target PD specific muscular tightness  PWR! Up 2 x 10 from standing position -Pt performed exercise well but required VC for arm placement at end of concentric phase   The following activities were completed in parallel bars or at balance bar  Sidestepping with Blue T band around ankles 2 x 12 bidirectional - UE use in front of balance bar in case of LOB   Therex Leg press - 2 x 15 x 50 lb - reports weight "about right"  -cues for proper LE placement  Leg press for plantarflexion 70 lb 2 x 10  Neuro Re-ed Step taps on airex - to 8 inch step w/ 3 hedgehogs  -working on picking up feet and avoiding objects in path   HR on 1/2 foam 2 x 15  -for increased ROM and to allow DF stretch to aid rigidity  -B HH support   Ambulating x 3 min -various challenges including walking backwards, turning, increasing speed, taking large steps -pt tolerated challenges well without LOB and able to complete all as instructed    Unless otherwise stated, CGA was provided and gait belt donned in order to ensure pt safety                     PT Education - 02/08/21 1202     Education Details Educated rregarding incorporating postural exercses in to exercise routine    Person(s) Educated Patient    Methods Explanation;Demonstration    Comprehension Verbalized understanding;Returned demonstration              PT Short Term Goals - 01/29/21 1018       PT SHORT TERM GOAL #1   Title Patient will be adherent to HEP at least 3x a week to improve functional strength and balance for better safety at home.    Time 4    Period Weeks    Status New    Target Date 02/26/21      PT SHORT TERM  GOAL #2   Title Patient will improve BUE gross strength to 4/5 to improve functional strength for lifting/carrying groceries    Time 4    Period Weeks    Status New               PT Long Term  Goals - 01/29/21 1020       PT LONG TERM GOAL #1   Title Patient will increase BLE gross strength to 4+/5 as to improve functional strength for independent gait, increased standing tolerance and increased ADL ability.    Time 8    Period Weeks    Status New    Target Date 03/26/21      PT LONG TERM GOAL #2   Title Patient will report a worst pain of 3/10 on VAS in low back  to improve tolerance with ADLs and reduced symptoms with activities.    Time 8    Period Weeks    Status New    Target Date 03/26/21      PT LONG TERM GOAL #3   Title Patient will tolerate standing/walking for at least 30 min with a maximum of 3/10 back pain to exhibit improved tolerance with ADLs such as cooking/cleaning, grocery shopping.    Time 8    Period Weeks    Status New    Target Date 03/26/21      PT LONG TERM GOAL #4   Title Patient will increase 10 meter walk test to >1.57ms as to improve gait speed for better community ambulation and to reduce fall risk.    Time 8    Period Weeks    Status New    Target Date 03/26/21      PT LONG TERM GOAL #5   Title Patient will improve FOTO score by at least 5% to indicate improved functional mobility with ADLs.    Time 8    Period Weeks    Status New    Target Date 03/26/21      Additional Long Term Goals   Additional Long Term Goals Yes      PT LONG TERM GOAL #6   Title Patient will increase six minute walk test distance to >1300 for progression to community ambulator and improve gait ability close to age group norms.    Time 8    Period Weeks    Status New    Target Date 03/26/21                   Plan - 02/08/21 1203     Clinical Impression Statement Continued with current plan of care as laid out in evaluation and recent prior  sessions. Pt remains motivated to advance progress toward goals in order to maximize independence and safety at home. Added repetition and difficulty to several exercsies as described in note today and pt tolerated well. Pt will continue to benefit from skilled physical therapy services in order to improve QOL, uimprove LE strength and prevent falls.    Personal Factors and Comorbidities Comorbidity 3+;Age    Comorbidities Parkinson's disease, DM x2 (well controlled), chronic low back pain; s/p CABG x5, CKD III    Examination-Activity Limitations Locomotion Level;Squat;Stairs;Stand;Transfers    Examination-Participation Restrictions Community Activity;Shop;Volunteer;Yard Work    SMerchant navy officerEvolving/Moderate complexity    Clinical Decision Making Moderate    Rehab Potential Good    PT Frequency 2x / week    PT Duration 8 weeks    PT Treatment/Interventions Cryotherapy;Electrical Stimulation;Moist Heat;Gait training;Stair training;Functional mobility training;Therapeutic activities;Therapeutic exercise;Balance training;Neuromuscular re-education;Patient/family education;Manual techniques;Passive range of motion;Energy conservation    PT Next Visit Plan Continue strength training, review HEP, add PWR! moves as indicated, incorporate balance intervention as indicated    PT Home Exercise Plan Provided 02/01/21    Consulted and Agree with  Plan of Care Patient             Patient will benefit from skilled therapeutic intervention in order to improve the following deficits and impairments:  Decreased balance, Decreased endurance, Decreased mobility, Difficulty walking, Decreased activity tolerance, Decreased strength, Postural dysfunction, Pain  Visit Diagnosis: Difficulty in walking, not elsewhere classified  Unsteadiness on feet  Muscle weakness (generalized)     Problem List Patient Active Problem List   Diagnosis Date Noted   Anemia, unspecified 02/07/2021    Neutropenia ([place]) 01/29/2021   Thrombocytopenia ([place]) 01/29/2021   Pneumonia 12/28/2020   Hyponatremia 12/22/2020   Bilateral leg weakness 12/20/2020   Prostate cancer screening 09/08/2020   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[place]) 09/06/2020   Low back pain of over 3 months duration 08/19/2019   Pulmonary hypertension ([place])    Insomnia 12/21/2018   Sleep apnea in adult 12/09/2018   Periodic limb movement disorder 12/09/2018   Abnormal findings on diagnostic imaging of lung 07/09/2018   Lung nodule 07/09/2018   Pulmonary nodules 05/18/2018   Wears hearing aid in both ears 05/17/2018   Leg pain, bilateral 02/20/2018   Dyspnea    Effort angina ([facility])    CKD (chronic kidney disease) stage 3, GFR 30-59 ml/min ([place]) 09/21/2017   History of skin cancer in adulthood 08/14/2016   Parkinson's disease ([place]) 08/09/2016   Bilateral carotid artery stenosis 04/02/2015   Vertigo, peripheral 10/17/2014   Benign prostatic hypertrophy with urinary frequency 01/31/2014   Encounter for Medicare annual wellness exam 07/02/2013   Obesity 04/03/2013   Other malaise and fatigue 09/21/2012   Hyperlipidemia    Hypertension    3-vessel coronary artery disease    S/P CABG x 5    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 [place], [place], [address] Phone: [phone]   Fax:  [phone]  Name: Johnathan [facility]. MRN: [id number] Date of Birth: 10-Sep-1945

## 2021-02-10 LAB — PLATELET ANTIBODY PROFILE
Glycoprotein IV Antibody: NEGATIVE
HLA Ab Ser Ql EIA: NEGATIVE
IA/IIA Antibody: NEGATIVE
IB/IX Antibody: NEGATIVE
IIB/IIIA Antibody: NEGATIVE

## 2021-02-12 LAB — PROTEIN ELECTROPHORESIS, SERUM
A/G Ratio: 2 — ABNORMAL HIGH (ref 0.7–1.7)
Albumin ELP: 4 g/dL (ref 2.9–4.4)
Alpha-1-Globulin: 0.1 g/dL (ref 0.0–0.4)
Alpha-2-Globulin: 0.8 g/dL (ref 0.4–1.0)
Beta Globulin: 0.6 g/dL — ABNORMAL LOW (ref 0.7–1.3)
Gamma Globulin: 0.5 g/dL (ref 0.4–1.8)
Globulin, Total: 2 g/dL — ABNORMAL LOW (ref 2.2–3.9)
Total Protein ELP: 6 g/dL (ref 6.0–8.5)

## 2021-02-13 ENCOUNTER — Ambulatory Visit: Payer: Medicare Other | Admitting: Physical Therapy

## 2021-02-13 ENCOUNTER — Other Ambulatory Visit: Payer: Self-pay

## 2021-02-13 DIAGNOSIS — R2681 Unsteadiness on feet: Secondary | ICD-10-CM

## 2021-02-13 DIAGNOSIS — R262 Difficulty in walking, not elsewhere classified: Secondary | ICD-10-CM

## 2021-02-13 DIAGNOSIS — M6281 Muscle weakness (generalized): Secondary | ICD-10-CM

## 2021-02-13 DIAGNOSIS — M545 Low back pain, unspecified: Secondary | ICD-10-CM | POA: Diagnosis not present

## 2021-02-13 DIAGNOSIS — G8929 Other chronic pain: Secondary | ICD-10-CM | POA: Diagnosis not present

## 2021-02-13 DIAGNOSIS — R918 Other nonspecific abnormal finding of lung field: Secondary | ICD-10-CM | POA: Diagnosis not present

## 2021-02-13 NOTE — Therapy (Signed)
[place] MAIN [facility] SERVICES [address] [place], [place], [address] Phone: [phone]   Fax:  [phone]  Physical Therapy Treatment  Patient Details  Name: Johnathan Arnold. MRN: [id number] Date of Birth: [birth year] Referring Provider (PT): Dr. Carles Collet   Encounter Date: 02/13/2021   PT End of Session - 02/13/21 0830     Visit Number 5    Number of Visits 17    Date for PT Re-Evaluation 03/26/21    Authorization Type Medicare; start of care 01/29/21    Progress Note Due on Visit 10    PT Start Time 0819    PT Stop Time 0858    PT Time Calculation (min) 39 min    Equipment Utilized During Treatment Gait belt    Activity Tolerance Patient tolerated treatment well    Behavior During Therapy [facility] for tasks assessed/performed             Past Medical History:  Diagnosis Date   3-vessel coronary artery disease    s/p  5 vessel CABG   Diabetes mellitus without complication ([place])    History of cardiac catheterization 2011   [facility]   Hyperlipidemia    Hypertension    Hypertriglyceridemia    Parkinson's disease ([place])    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 LIMA to LAD, SVG to D1, OM1 and RPDA   CORONARY ARTERY BYPASS GRAFT  03/1998   5 vessel, [facility]   RIGHT HEART CATH N/A 04/04/2019   Procedure: RIGHT HEART CATH;  Surgeon: Wellington Hampshire, MD;  Location: [place] CV LAB;  Service: Cardiovascular;  Laterality: N/A;   RIGHT/LEFT HEART CATH AND CORONARY ANGIOGRAPHY N/A 02/01/2018   Procedure: RIGHT/LEFT HEART CATH AND CORONARY ANGIOGRAPHY;  Surgeon: Wellington Hampshire, MD;  Location: [place] CV LAB;  Service: Cardiovascular;  Laterality: N/A;    There were no vitals filed for this visit.   Subjective Assessment - 02/13/21 0823     Subjective Pt reports no significant changes sicne last time. Reports HEP  has been going well.    Pertinent History 75 yo Male presents to therapy with history of Parkinson's Disease. He reports in June 2022 he got out of bed one morning and his legs just quit working. He was unable to get up and move around. He reports progressive weakness and not feeling well for about 4-5 weeks. He reports his legs feel weak and he is unable to walk as far. He reports soreness in calf. Patient presents to therapy without AD. he reports he used a cane for a few weeks but doesn't use it anymore. He reports falling a few weeks ago while at the beach. He reports he lost his balance in the sand. He reports 6 weeks ago he was on the portch swing and when he stood up he tried to get his balance and must have passed out. He denies any significant injuries with each fall. He did see his neurologist last week who increased his carbiopa/levadopa. He reports he has been doing okay with increased medication; He reports having pneumonia but wasn't aware and neurologist believes that exacerbated his Parkinson's Disease. He is doing [employer] which he does 3 days a week; He does have a resting tremor in RUE;  He also reports difficulty writing; He reports difficulty buttoning a  button but other dressing is not difficult. In addition he has a history of chronic low back pain which he states has been increasing in center of low back. He reports his back pain worse with standing and activity and alleviates with sitting;    How long can you sit comfortably? no difficulty; sitting alleviates pain;    How long can you stand comfortably? 10-15 min    How long can you walk comfortably? 5-10 min, worse with carrying something and with walking;    Diagnostic tests MRI in June 2022 Multilevel degenerative changes without stenosis;    Patient Stated Goals "Improve back pain, increase strength in legs and arms"    Currently in Pain? No/denies    Pain Score 0-No pain    Multiple Pain Sites No              Treatment provided this session  Therex:  Octane level 4 x 4 min for aerobic priming   There Act:  TRX Squat with HR and Row 2 x 12 each  TRX lunge with shoulder HABD x 10 on ea LE  -incorporating stretch for UE to target PD specific muscular ti  - HR added squat to promote quick and powerful movement   The following activities were completed in parallel bars or at balance bar   Therex:  Sidestepping with Blue T band around ankles 2 x 12 bidirectional  - UE use in front of balance bar in case of LOB  Leg press - 2 x 15 x 55 lb - reports weight "about right"  Leg press for plantarflexion 70 lb 2 x 10-   Neuro re-ed:  Step taps on airex - to 8 inch step w/ 3 hedgehogs  -working on picking up feet and avoiding objects in path   PWR! Up 2 x 10 from standing position  -Pt performed exercise well without need for VC or TC      HR on 1/2 foam 2 x 15  -for increased ROM and to allow passive DF stretch    -Pt reports this visit caused him the most soreness and fatigue and he felt like he got the "best workout" so far  Unless otherwise stated, CGA was provided and gait belt donned in order to ensure pt safety                      PT Education - 02/13/21 0824     Education Details Pt educated regarding POC and HEP    Person(s) Educated Patient    Comprehension Verbalized understanding              PT Short Term Goals - 01/29/21 1018       PT SHORT TERM GOAL #1   Title Patient will be adherent to HEP at least 3x a week to improve functional strength and balance for better safety at home.    Time 4    Period Weeks    Status New    Target Date 02/26/21      PT SHORT TERM GOAL #2   Title Patient will improve BUE gross strength to 4/5 to improve functional strength for lifting/carrying groceries    Time 4    Period Weeks    Status New               PT Long Term Goals - 01/29/21 1020       PT LONG TERM GOAL #1   Title Patient will  increase BLE gross strength to 4+/5 as to improve functional strength for independent gait, increased standing tolerance and increased ADL ability.    Time 8    Period Weeks    Status New    Target Date 03/26/21      PT LONG TERM GOAL #2   Title Patient will report a worst pain of 3/10 on VAS in low back  to improve tolerance with ADLs and reduced symptoms with activities.    Time 8    Period Weeks    Status New    Target Date 03/26/21      PT LONG TERM GOAL #3   Title Patient will tolerate standing/walking for at least 30 min with a maximum of 3/10 back pain to exhibit improved tolerance with ADLs such as cooking/cleaning, grocery shopping.    Time 8    Period Weeks    Status New    Target Date 03/26/21      PT LONG TERM GOAL #4   Title Patient will increase 10 meter walk test to >1.68ms as to improve gait speed for better community ambulation and to reduce fall risk.    Time 8    Period Weeks    Status New    Target Date 03/26/21      PT LONG TERM GOAL #5   Title Patient will improve FOTO score by at least 5% to indicate improved functional mobility with ADLs.    Time 8    Period Weeks    Status New    Target Date 03/26/21      Additional Long Term Goals   Additional Long Term Goals Yes      PT LONG TERM GOAL #6   Title Patient will increase six minute walk test distance to >1300 for progression to community ambulator and improve gait ability close to age group norms.    Time 8    Period Weeks    Status New    Target Date 03/26/21                   Plan - 02/13/21 0831     Clinical Impression Statement Continued with current plan of care as laid out in evaluation and recent prior sessions. Pt remains motivated to advance progress toward goals in order to maximize independence and safety at home. Added repetition and difficulty to several exercsies as described in note today and pt tolerated well. Pt will continue to benefit from skilled physical therapy  services in order to improve QOL, uimprove LE strength and prevent falls.    Personal Factors and Comorbidities Comorbidity 3+;Age    Comorbidities Parkinson's disease, DM x2 (well controlled), chronic low back pain; s/p CABG x5, CKD III    Examination-Activity Limitations Locomotion Level;Squat;Stairs;Stand;Transfers    Examination-Participation Restrictions Community Activity;Shop;Volunteer;Yard Work    SMerchant navy officerEvolving/Moderate complexity    Clinical Decision Making Moderate    Rehab Potential Good    PT Frequency 2x / week    PT Duration 8 weeks    PT Treatment/Interventions Cryotherapy;Electrical Stimulation;Moist Heat;Gait training;Stair training;Functional mobility training;Therapeutic activities;Therapeutic exercise;Balance training;Neuromuscular re-education;Patient/family education;Manual techniques;Passive range of motion;Energy conservation    PT Next Visit Plan Continue strength training, review HEP, add PWR! moves as indicated, incorporate balance intervention as indicated    PT Home Exercise Plan Provided 02/01/21    Consulted and Agree with Plan of Care Patient             Patient will benefit from  skilled therapeutic intervention in order to improve the following deficits and impairments:  Decreased balance, Decreased endurance, Decreased mobility, Difficulty walking, Decreased activity tolerance, Decreased strength, Postural dysfunction, Pain  Visit Diagnosis: Difficulty in walking, not elsewhere classified  Unsteadiness on feet  Muscle weakness (generalized)     Problem List Patient Active Problem List   Diagnosis Date Noted   Anemia, unspecified 02/07/2021   Neutropenia ([place]) 01/29/2021   Thrombocytopenia ([place]) 01/29/2021   Pneumonia 12/28/2020   Hyponatremia 12/22/2020   Bilateral leg weakness 12/20/2020   Prostate cancer screening 09/08/2020   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[place]) 09/06/2020   Low back  pain of over 3 months duration 08/19/2019   Pulmonary hypertension ([place])    Insomnia 12/21/2018   Sleep apnea in adult 12/09/2018   Periodic limb movement disorder 12/09/2018   Abnormal findings on diagnostic imaging of lung 07/09/2018   Lung nodule 07/09/2018   Pulmonary nodules 05/18/2018   Wears hearing aid in both ears 05/17/2018   Leg pain, bilateral 02/20/2018   Dyspnea    Effort angina (H[facility])    CKD (chronic kidney disease) stage 3, GFR 30-59 ml/min ([place]) 09/21/2017   History of skin cancer in adulthood 08/14/2016   Parkinson's disease ([place]) 08/09/2016   Bilateral carotid artery stenosis 04/02/2015   Vertigo, peripheral 10/17/2014   Benign prostatic hypertrophy with urinary frequency 01/31/2014   Encounter for Medicare annual wellness exam 07/02/2013   Obesity 04/03/2013   Other malaise and fatigue 09/21/2012   Hyperlipidemia    Hypertension    3-vessel coronary artery disease    S/P CABG x 5    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 [place], [place], [address] Phone: [phone]   Fax:  [phone]  Name: Johnathan Arnold. MRN: [id number] Date of Birth: [birth year]

## 2021-02-14 ENCOUNTER — Encounter: Payer: Self-pay | Admitting: Cardiovascular Disease

## 2021-02-14 ENCOUNTER — Ambulatory Visit (INDEPENDENT_AMBULATORY_CARE_PROVIDER_SITE_OTHER): Payer: Medicare Other | Admitting: Cardiovascular Disease

## 2021-02-14 VITALS — BP 138/70 | HR 57 | Ht 66.0 in | Wt 178.0 lb

## 2021-02-14 DIAGNOSIS — I25118 Atherosclerotic heart disease of native coronary artery with other forms of angina pectoris: Secondary | ICD-10-CM | POA: Diagnosis not present

## 2021-02-14 DIAGNOSIS — I10 Essential (primary) hypertension: Secondary | ICD-10-CM | POA: Diagnosis not present

## 2021-02-14 DIAGNOSIS — I272 Pulmonary hypertension, unspecified: Secondary | ICD-10-CM

## 2021-02-14 DIAGNOSIS — I208 Other forms of angina pectoris: Secondary | ICD-10-CM | POA: Diagnosis not present

## 2021-02-14 DIAGNOSIS — E785 Hyperlipidemia, unspecified: Secondary | ICD-10-CM | POA: Diagnosis not present

## 2021-02-14 DIAGNOSIS — I779 Disorder of arteries and arterioles, unspecified: Secondary | ICD-10-CM

## 2021-02-14 NOTE — Progress Notes (Signed)
Cardiology Office Note   Date:  02/14/2021   ID:  Demitrus Harpin., DOB 1945-08-04, MRN [id number]  PCP:  [name], MD  Cardiologist:   [name], MD   Chief Complaint  Patient presents with   Other    2 month f/u pt would like to discuss BP and testing. Meds reviewed verbally with pt.       History of Present Illness: Johnathan Arnold. is a 75 y.o. male who presents for a followup visit.  He has known history of coronary artery disease status post CABG in 1999. He also has known history of hypertension, chronic kidney disease, hyperlipidemia, Parkinson's disease, sleep apnea on CPAP and type 2 diabetes.  Previous carotid Doppler showed mild nonobstructive bilateral disease.  A right and left cardiac catheterization was done in August 2019 which showed occluded native arteries with patent grafts including LIMA to LAD, SVG to diagonal, SVG to OM and SVG to distal RCA.  Right heart catheterization showed normal filling pressures, mild pulmonary hypertension and normal cardiac output. He has chronic bilateral exertional leg pain with normal lower extremity arterial Doppler in 2018.    He has known history of moderate to severe pulmonary hypertension. VQ scan was low probability for pulmonary embolism. Right heart catheterization in October of 2020 showed moderate pulmonary hypertension at 54 /13 mmHg with wedge pressure of 17 mmHg.  Pulmonary vascular resistance was 2.22 [place] units.  Pulmonary hypertension was felt to be of a mixed etiology.   He has underlying sleep apnea but uses CPAP on a regular basis.  He has Parkinson's disease and has been dealing with labile hypertension.  He feels best when his blood pressure is high.  He does not feel well if his systolic blood pressure goes below 130.  For that reason, he has been taking losartan as needed instead of on a daily basis.  He continues to take small dose carvedilol and Imdur.  Dose of carvedilol was decreased due  to bradycardia. Echocardiogram done last month showed normal LV systolic function, stable moderate pulmonary hypertension and mild mitral regurgitation.  Past Medical History:  Diagnosis Date   3-vessel coronary artery disease    s/p  5 vessel CABG   Diabetes mellitus without complication ([place])    History of cardiac catheterization 2011   [facility]   Hyperlipidemia    Hypertension    Hypertriglyceridemia    Parkinson's disease ([place])    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 LIMA to LAD, SVG to D1, OM1 and RPDA   CORONARY ARTERY BYPASS GRAFT  03/1998   5 vessel, [facility]   RIGHT HEART CATH N/A 04/04/2019   Procedure: RIGHT HEART CATH;  Surgeon: Wellington Hampshire, MD;  Location: [place] CV LAB;  Service: Cardiovascular;  Laterality: N/A;   RIGHT/LEFT HEART CATH AND CORONARY ANGIOGRAPHY N/A 02/01/2018   Procedure: RIGHT/LEFT HEART CATH AND CORONARY ANGIOGRAPHY;  Surgeon: Wellington Hampshire, MD;  Location: [place] CV LAB;  Service: Cardiovascular;  Laterality: N/A;     Current Outpatient Medications  Medication Sig Dispense Refill   aspirin EC 81 MG tablet Take 1 tablet (81 mg total) by mouth at bedtime. 30 tablet 11   carbidopa-levodopa (SINEMET CR) 50-200 MG tablet Take 1 tablet by mouth at bedtime. 90 tablet 2   carbidopa-levodopa (SINEMET IR)  25-100 MG tablet Take 2 tablets by mouth 3 (three) times daily. 2 at 8am, 2 at 11am, 2 at 2 pm, 1 at 5pm 630 tablet 1   carvedilol (COREG) 6.25 MG tablet Take 0.5 tablets (3.125 mg total) by mouth 2 (two) times daily.  0   clonazePAM (KLONOPIN) 0.5 MG tablet TAKE 1 AND 1/2 TABLET BY MOUTH EVERY NIGHT AT BEDTIME 45 tablet 1   furosemide (LASIX) 20 MG tablet Take 1 tablet (20 mg total) by mouth daily. 90 tablet 3   isosorbide mononitrate (IMDUR) 60 MG 24 hr tablet Take 1 tablet (60 mg total) by mouth daily. 90 tablet 1    losartan (COZAAR) 25 MG tablet Take 1 tablet (25 mg total) by mouth daily. 90 tablet 1   Multiple Vitamin (MULTIVITAMIN) tablet Take 1 tablet by mouth daily.     nitroGLYCERIN (NITROSTAT) 0.4 MG SL tablet Place 1 tablet (0.4 mg total) under the tongue every 5 (five) minutes as needed for chest pain. 25 tablet 1   omeprazole (PRILOSEC) 20 MG capsule TAKE ONE CAPSULE BY MOUTH EVERY MORNING 90 capsule 0   polyethylene glycol (MIRALAX / GLYCOLAX) packet Take 17 g by mouth daily.      ranolazine (RANEXA) 1000 MG SR tablet TAKE ONE TABLET BY MOUTH TWICE A DAY 60 tablet 1   rosuvastatin (CRESTOR) 10 MG tablet Take 1 tablet (10 mg total) by mouth daily. 90 tablet 3   tamsulosin (FLOMAX) 0.4 MG CAPS capsule TAKE ONE CAPSULE BY MOUTH DAILY 90 capsule 3   No current facility-administered medications for this visit.    Allergies:   Patient has no known allergies.    Social History:  The patient  reports that he has never smoked. He has been exposed to tobacco smoke. He has never used smokeless tobacco. He reports current alcohol use. He reports that he does not use drugs.   Family History:  The patient's family history includes Healthy in his son; Heart attack (age of onset: 60) in his father; Heart attack (age of onset: 84) in his mother; Heart disease in his brother and father; Hypertension in his mother.    ROS:  Please see the history of present illness.   Otherwise, review of systems are positive for none.   All other systems are reviewed and negative.    PHYSICAL EXAM: VS:  BP 138/70 (BP Location: Left Arm, Patient Position: Sitting, Cuff Size: Normal)   Pulse (!) 57   Ht '5\' 6"'$  (1.676 m)   Wt 178 lb (80.7 kg)   SpO2 98%   BMI 28.73 kg/m  , BMI Body mass index is 28.73 kg/m. GEN: Well nourished, well developed, in no acute distress  HEENT: normal  Neck: no JVD, carotid bruits, or masses Cardiac: RRR; no murmurs, rubs, or gallops,no edema  Respiratory:  clear to auscultation bilaterally,  normal work of breathing GI: soft, nontender, nondistended, + BS MS: no deformity or atrophy  Skin: warm and dry, no rash Neuro:  Strength and sensation are intact Psych: euthymic mood, full affect   EKG:  EKG is not ordered today.   Recent Labs: 11/08/2020: ALT 7 11/23/2020: TSH 1.241 12/20/2020: BUN 14; Creatinine, Ser 1.76; Potassium 4.9; Sodium 136 02/07/2021: Hemoglobin 11.0; Platelets 140    Lipid Panel    Component Value Date/Time   CHOL 122 01/24/2021 0827   TRIG 111.0 01/24/2021 0827   HDL 62.80 01/24/2021 0827   CHOLHDL 2 01/24/2021 0827   VLDL 22.2 01/24/2021 0827  LDLCALC 37 01/24/2021 0827   LDLDIRECT 38.0 08/06/2015 1118      Wt Readings from Last 3 Encounters:  02/14/21 178 lb (80.7 kg)  02/07/21 178 lb (80.7 kg)  01/24/21 176 lb 6.4 oz (80 kg)        ASSESSMENT AND PLAN:  1.  Coronary artery disease involving native coronary arteries with stable angina: Most recent cardiac catheterization in 2019 showed patent grafts.  He likely has some ischemia in the proximal LAD distribution that is not bypassed.  Continue Ranexa and Imdur.  2.  Moderate pulmonary hypertension: His symptoms are overall stable and he appears to be euvolemic on current dose of furosemide.   3. Bilateral carotid artery disease:  Mild nonobstructive disease. Continue treatment of risk factors.  4. Hyperlipidemia: Continue treatment with rosuvastatin. Most recent lipid profile showed an LDL of 32.  5. Essential hypertension: He does not seem to be tolerating normal readings of blood pressure likely due to his Parkinson's.  I suspect that his having orthostatic hypotension as well. I think it is reasonable to continue to use losartan only as needed to avoid symptomatic hypotension.    Disposition:   FU with me in 6 months  Signed,  [name], MD  02/14/2021 3:59 PM    [facility]

## 2021-02-14 NOTE — Patient Instructions (Signed)

## 2021-02-18 ENCOUNTER — Ambulatory Visit: Payer: Medicare Other | Admitting: Physical Therapy

## 2021-02-18 ENCOUNTER — Other Ambulatory Visit: Payer: Self-pay

## 2021-02-18 DIAGNOSIS — R262 Difficulty in walking, not elsewhere classified: Secondary | ICD-10-CM

## 2021-02-18 DIAGNOSIS — G8929 Other chronic pain: Secondary | ICD-10-CM | POA: Diagnosis not present

## 2021-02-18 DIAGNOSIS — R918 Other nonspecific abnormal finding of lung field: Secondary | ICD-10-CM | POA: Diagnosis not present

## 2021-02-18 DIAGNOSIS — M545 Low back pain, unspecified: Secondary | ICD-10-CM | POA: Diagnosis not present

## 2021-02-18 DIAGNOSIS — M6281 Muscle weakness (generalized): Secondary | ICD-10-CM | POA: Diagnosis not present

## 2021-02-18 DIAGNOSIS — R2681 Unsteadiness on feet: Secondary | ICD-10-CM

## 2021-02-18 NOTE — Therapy (Signed)
[place] MAIN [facility] SERVICES [address] [place], [place], [address] Phone: [phone]   Fax:  [phone]  Physical Therapy Treatment  Patient Details  Name: Johnathan Arnold. MRN: [id number] Date of Birth: [birth year] Referring Provider (PT): Dr. Carles Collet   Encounter Date: 02/18/2021   PT End of Session - 02/18/21 0908     Visit Number 6    Number of Visits 17    Date for PT Re-Evaluation 03/26/21    Authorization Type Medicare; start of care 01/29/21    Progress Note Due on Visit 10    PT Start Time 0845    PT Stop Time 0926    PT Time Calculation (min) 41 min    Equipment Utilized During Treatment Gait belt    Activity Tolerance Patient tolerated treatment well    Behavior During Therapy [facility] for tasks assessed/performed             Past Medical History:  Diagnosis Date   3-vessel coronary artery disease    s/p  5 vessel CABG   Diabetes mellitus without complication ([place])    History of cardiac catheterization 2011   [place]   Hyperlipidemia    Hypertension    Hypertriglyceridemia    Parkinson's disease ([place])    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 LIMA to LAD, SVG to D1, OM1 and RPDA   CORONARY ARTERY BYPASS GRAFT  03/1998   5 vessel, [facility]   RIGHT HEART CATH N/A 04/04/2019   Procedure: RIGHT HEART CATH;  Surgeon: Wellington Hampshire, MD;  Location: [place] CV [facility];  Service: Cardiovascular;  Laterality: N/A;   RIGHT/LEFT HEART CATH AND CORONARY ANGIOGRAPHY N/A 02/01/2018   Procedure: RIGHT/LEFT HEART CATH AND CORONARY ANGIOGRAPHY;  Surgeon: Wellington Hampshire, MD;  Location: [place] [facility];  Service: Cardiovascular;  Laterality: N/A;    There were no vitals filed for this visit.   Treatment provided this session   Therex:  Octane level 4 x 4 min for aerobic priming    There Act:  TRX Squat  with HR and Row 2 x 12 each  TRX lunge with shoulder HABD 2 x 6 on ea LE  -incorporating stretch for UE to target PD specific muscular tightness, cues for distance from anchor point to ensure proper stretch - HR and speed component added squat to promote quick and powerful movement    PWR! Up 2 x 10 from standing position  -VC, TC for incorporation of speed component     The following activities were completed in parallel bars or at balance bar    Therex:   Leg press - 2 x 15 x 55 lb - reports weight "about right"  Leg press for plantarflexion 55 lb 2 x 10-    HR on 1/2 foam 2 x 15  -for increased ROM and to allow passive DF stretch    Neuro re-ed:  Step taps on airex - to 8 inch step w/ 3 hedgehogs  -working on picking up feet and avoiding objects in path    Sidestepping with BTB and on 1/2 foam roller -2 x 12 each  -to challenge a/p stability and ankle muscle activation    PRW! Rock  -2x 10  -cues for increasing trunk rotation      Airex: reaching for hedgehogs -3 hedgegogs  x 5 repetitions with ea LE as stance limb -Pt reports this as most difficulty exercise of the day due to challenging his limits of stability  The following activities were completed in parallel bars or at balance bar  Airex HR 2*10 -no use of hands required     Unless otherwise stated, SBA was provided and gait belt donned in order to ensure pt safety                            PT Education - 02/18/21 0858     Education Details Pt educated regarding UE ipmortance in selecting gym aerobic conditioning machines    Person(s) Educated Patient    Methods Explanation;Demonstration    Comprehension Verbalized understanding;Returned demonstration              PT Short Term Goals - 01/29/21 1018       PT SHORT TERM GOAL #1   Title Patient will be adherent to HEP at least 3x a week to improve functional strength and balance for better safety at home.    Time 4     Period Weeks    Status New    Target Date 02/26/21      PT SHORT TERM GOAL #2   Title Patient will improve BUE gross strength to 4/5 to improve functional strength for lifting/carrying groceries    Time 4    Period Weeks    Status New               PT Long Term Goals - 01/29/21 1020       PT LONG TERM GOAL #1   Title Patient will increase BLE gross strength to 4+/5 as to improve functional strength for independent gait, increased standing tolerance and increased ADL ability.    Time 8    Period Weeks    Status New    Target Date 03/26/21      PT LONG TERM GOAL #2   Title Patient will report a worst pain of 3/10 on VAS in low back  to improve tolerance with ADLs and reduced symptoms with activities.    Time 8    Period Weeks    Status New    Target Date 03/26/21      PT LONG TERM GOAL #3   Title Patient will tolerate standing/walking for at least 30 min with a maximum of 3/10 back pain to exhibit improved tolerance with ADLs such as cooking/cleaning, grocery shopping.    Time 8    Period Weeks    Status New    Target Date 03/26/21      PT LONG TERM GOAL #4   Title Patient will increase 10 meter walk test to >1.46ms as to improve gait speed for better community ambulation and to reduce fall risk.    Time 8    Period Weeks    Status New    Target Date 03/26/21      PT LONG TERM GOAL #5   Title Patient will improve FOTO score by at least 5% to indicate improved functional mobility with ADLs.    Time 8    Period Weeks    Status New    Target Date 03/26/21      Additional Long Term Goals   Additional Long Term Goals Yes      PT LONG TERM GOAL #6   Title Patient will increase six minute walk test distance to >1300  for progression to community ambulator and improve gait ability close to age group norms.    Time 8    Period Weeks    Status New    Target Date 03/26/21                   Plan - 02/18/21 0909     Clinical Impression Statement Continued  with current plan of care as laid out in evaluation and recent prior sessions. Pt remains motivated to advance progress toward goals in order to maximize independence and safety at home. Added repetition and difficulty to several exercsies as described in note today and pt tolerated well. Pt will continue to benefit from skilled physical therapy services in order to improve QOL, uimprove LE strength and prevent falls.    Personal Factors and Comorbidities Comorbidity 3+;Age    Comorbidities Parkinson's disease, DM x2 (well controlled), chronic low back pain; s/p CABG x5, CKD III    Examination-Activity Limitations Locomotion Level;Squat;Stairs;Stand;Transfers    Examination-Participation Restrictions Community Activity;Shop;Volunteer;Yard Work    [employer] officer Evolving/Moderate complexity    Clinical Decision Making Moderate    Rehab Potential Good    PT Frequency 2x / week    PT Duration 8 weeks    PT Treatment/Interventions Cryotherapy;Electrical Stimulation;Moist Heat;Gait training;Stair training;Functional mobility training;Therapeutic activities;Therapeutic exercise;Balance training;Neuromuscular re-education;Patient/family education;Manual techniques;Passive range of motion;Energy conservation    PT Next Visit Plan Continue strength training, review HEP, add PWR! moves as indicated, incorporate balance intervention as indicated    PT Home Exercise Plan Provided 02/01/21    Consulted and Agree with Plan of Care Patient             Patient will benefit from skilled therapeutic intervention in order to improve the following deficits and impairments:  Decreased balance, Decreased endurance, Decreased mobility, Difficulty walking, Decreased activity tolerance, Decreased strength, Postural dysfunction, Pain  Visit Diagnosis: Difficulty in walking, not elsewhere classified  Unsteadiness on feet  Muscle weakness (generalized)  Chronic low back pain, unspecified back  pain laterality, unspecified whether sciatica present     Problem List Patient Active Problem List   Diagnosis Date Noted   Anemia, unspecified 02/07/2021   Neutropenia ([place]) 01/29/2021   Thrombocytopenia ([place]) 01/29/2021   Pneumonia 12/28/2020   Hyponatremia 12/22/2020   Bilateral leg weakness 12/20/2020   Prostate cancer screening 09/08/2020   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[place]) 09/06/2020   Low back pain of over 3 months duration 08/19/2019   Pulmonary hypertension ([place])    Insomnia 12/21/2018   Sleep apnea in adult 12/09/2018   Periodic limb movement disorder 12/09/2018   Abnormal findings on diagnostic imaging of lung 07/09/2018   Lung nodule 07/09/2018   Pulmonary nodules 05/18/2018   Wears hearing aid in both ears 05/17/2018   Leg pain, bilateral 02/20/2018   Dyspnea    Effort angina ([place])    CKD (chronic kidney disease) stage 3, GFR 30-59 ml/min ([place]) 09/21/2017   History of skin cancer in adulthood 08/14/2016   Parkinson's disease ([place]) 08/09/2016   Bilateral carotid artery stenosis 04/02/2015   Vertigo, peripheral 10/17/2014   Benign prostatic hypertrophy with urinary frequency 01/31/2014   Encounter for Medicare annual wellness exam 07/02/2013   Obesity 04/03/2013   Other malaise and fatigue 09/21/2012   Hyperlipidemia    Hypertension    3-vessel coronary artery disease    S/P CABG x 5    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 [place], [place], [address] Phone: [phone]   Fax:  [phone]  Name: Zamarian Kwak. MRN: [id number] Date of Birth: Feb 10, 1946

## 2021-02-19 ENCOUNTER — Ambulatory Visit: Payer: Medicare Other | Admitting: Neurology

## 2021-02-21 ENCOUNTER — Other Ambulatory Visit: Payer: Self-pay

## 2021-02-21 ENCOUNTER — Ambulatory Visit: Payer: Medicare Other | Admitting: Physical Therapy

## 2021-02-21 MED ORDER — RANOLAZINE ER 1000 MG PO TB12: 1000.0000 mg | ORAL_TABLET | Freq: Two times a day (BID) | ORAL | 5 refills | Status: DC

## 2021-02-22 ENCOUNTER — Ambulatory Visit: Payer: Medicare Other

## 2021-02-26 ENCOUNTER — Ambulatory Visit: Payer: Medicare Other | Admitting: Physical Therapy

## 2021-02-28 ENCOUNTER — Other Ambulatory Visit: Payer: Self-pay

## 2021-02-28 ENCOUNTER — Ambulatory Visit: Payer: Medicare Other | Admitting: Physical Therapy

## 2021-02-28 DIAGNOSIS — R262 Difficulty in walking, not elsewhere classified: Secondary | ICD-10-CM | POA: Diagnosis not present

## 2021-02-28 DIAGNOSIS — R2681 Unsteadiness on feet: Secondary | ICD-10-CM | POA: Diagnosis not present

## 2021-02-28 DIAGNOSIS — M6281 Muscle weakness (generalized): Secondary | ICD-10-CM | POA: Diagnosis not present

## 2021-02-28 DIAGNOSIS — G8929 Other chronic pain: Secondary | ICD-10-CM | POA: Diagnosis not present

## 2021-02-28 DIAGNOSIS — R918 Other nonspecific abnormal finding of lung field: Secondary | ICD-10-CM | POA: Diagnosis not present

## 2021-02-28 DIAGNOSIS — M545 Low back pain, unspecified: Secondary | ICD-10-CM | POA: Diagnosis not present

## 2021-02-28 NOTE — Therapy (Signed)
[place] MAIN [facility] SERVICES [address] [place], [place], [address] Phone: [phone]   Fax:  [phone]  Physical Therapy Treatment  Patient Details  Name: Johnathan Arnold. MRN: [id number] Date of Birth: 25-Jul-1945 Referring Provider (PT): Dr. Carles Collet   Encounter Date: 02/28/2021   PT End of Session - 02/28/21 1003     Visit Number 7    Number of Visits 17    Date for PT Re-Evaluation 03/26/21    Authorization Type Medicare; start of care 01/29/21    Progress Note Due on Visit 10    PT Start Time 0916    PT Stop Time 1000    PT Time Calculation (min) 44 min    Equipment Utilized During Treatment Gait belt    Activity Tolerance Patient tolerated treatment well    Behavior During Therapy [facility] for tasks assessed/performed             Past Medical History:  Diagnosis Date   3-vessel coronary artery disease    s/p  5 vessel CABG   Diabetes mellitus without complication ([place])    History of cardiac catheterization 2011   [place]   Hyperlipidemia    Hypertension    Hypertriglyceridemia    Parkinson's disease ([place])    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 LIMA to LAD, SVG to D1, OM1 and RPDA   CORONARY ARTERY BYPASS GRAFT  03/1998   5 vessel, [facility]   RIGHT HEART CATH N/A 04/04/2019   Procedure: RIGHT HEART CATH;  Surgeon: Wellington Hampshire, MD;  Location: [place] [facility];  Service: Cardiovascular;  Laterality: N/A;   RIGHT/LEFT HEART CATH AND CORONARY ANGIOGRAPHY N/A 02/01/2018   Procedure: RIGHT/LEFT HEART CATH AND CORONARY ANGIOGRAPHY;  Surgeon: Wellington Hampshire, MD;  Location: [place] [facility];  Service: Cardiovascular;  Laterality: N/A;    There were no vitals filed for this visit.         Treatment provided this session   Therex:  Octane level 4 x 4 min for aerobic priming    There Act:   TRX Squat with Jump 2 x 10  -cues for eccentric control with jumping portion TRX Row 2 x 10each  -cues for slight knee flexion to allow for improved posture  TRX lunge with shoulder HABD 2 x 8 on ea LE  -incorporating stretch for UE to target PD specific muscular tightness, cues for distance from anchor point to ensure proper stretch - HR and speed component added squat to promote quick and powerful movement    PWR! Up 2 x 10 from standing position  -VC, TC for incorporation of speed component -HR added to improve speed component       The following activities were completed in parallel bars or at balance bar    Therex:   Leg press - 2 x 10 x 75 lb - reports weight "about right"  Leg press for plantarflexion 55 lb 2 x 10   HR on 1/2 foam 2 x 15  -for increased ROM and to allow passive DF stretch    Neuro re-ed:  Step taps on airex - to 8 inch step w/ 3 hedgehogs  -working on picking up feet and avoiding objects in path  1 round x 5 touches to each hedgehog with foot returing to airex pad  each rep  1 round with sequenced left to right hedgehog taps prior to returning foot to airex pad    Sidestepping with BTB and on 1/2 foam roller -2 x 12 each  -to challenge a/p stability and ankle muscle activation      PRW! Rock  -2x 10  -cues for challenging BOS by shifting weight to one foot.     The following activities were completed in parallel bars or at balance bar  Airex HR 2*10 -no use of hands required      Unless otherwise stated, SBA was provided and gait belt donned in order to ensure pt safety                      PT Education - 02/28/21 0958     Education Details exs forma nd technique    Person(s) Educated Patient    Methods Explanation;Demonstration    Comprehension Verbalized understanding;Returned demonstration;Verbal cues required              PT Short Term Goals - 01/29/21 1018       PT SHORT TERM GOAL #1   Title Patient will  be adherent to HEP at least 3x a week to improve functional strength and balance for better safety at home.    Time 4    Period Weeks    Status New    Target Date 02/26/21      PT SHORT TERM GOAL #2   Title Patient will improve BUE gross strength to 4/5 to improve functional strength for lifting/carrying groceries    Time 4    Period Weeks    Status New               PT Long Term Goals - 01/29/21 1020       PT LONG TERM GOAL #1   Title Patient will increase BLE gross strength to 4+/5 as to improve functional strength for independent gait, increased standing tolerance and increased ADL ability.    Time 8    Period Weeks    Status New    Target Date 03/26/21      PT LONG TERM GOAL #2   Title Patient will report a worst pain of 3/10 on VAS in low back  to improve tolerance with ADLs and reduced symptoms with activities.    Time 8    Period Weeks    Status New    Target Date 03/26/21      PT LONG TERM GOAL #3   Title Patient will tolerate standing/walking for at least 30 min with a maximum of 3/10 back pain to exhibit improved tolerance with ADLs such as cooking/cleaning, grocery shopping.    Time 8    Period Weeks    Status New    Target Date 03/26/21      PT LONG TERM GOAL #4   Title Patient will increase 10 meter walk test to >1.48m/s as to improve gait speed for better community ambulation and to reduce fall risk.    Time 8    Period Weeks    Status New    Target Date 03/26/21      PT LONG TERM GOAL #5   Title Patient will improve FOTO score by at least 5% to indicate improved functional mobility with ADLs.    Time 8    Period Weeks    Status New    Target Date 03/26/21      Additional Long Term  Goals   Additional Long Term Goals Yes      PT LONG TERM GOAL #6   Title Patient will increase six minute walk test distance to >1300 for progression to community ambulator and improve gait ability close to age group norms.    Time 8    Period Weeks    Status New     Target Date 03/26/21                   Plan - 02/28/21 1004     Clinical Impression Statement Continued with current plan of care as laid out in evaluation and recent prior sessions. Pt remains motivated to advance progress toward goals in order to maximize independence and safety at home. Added repetition and difficulty to several exercsies as described in note today and pt tolerated well. Pt will continue to benefit from skilled physical therapy services in order to improve QOL, uimprove LE strength and prevent falls.    Personal Factors and Comorbidities Comorbidity 3+;Age    Comorbidities Parkinson's disease, DM x2 (well controlled), chronic low back pain; s/p CABG x5, CKD III    Examination-Activity Limitations Locomotion Level;Squat;Stairs;Stand;Transfers    Examination-Participation Restrictions Community Activity;Shop;Volunteer;Yard Work    [employer] officer Evolving/Moderate complexity    Clinical Decision Making Moderate    Rehab Potential Good    PT Frequency 2x / week    PT Duration 8 weeks    PT Treatment/Interventions Cryotherapy;Electrical Stimulation;Moist Heat;Gait training;Stair training;Functional mobility training;Therapeutic activities;Therapeutic exercise;Balance training;Neuromuscular re-education;Patient/family education;Manual techniques;Passive range of motion;Energy conservation    PT Next Visit Plan Continue strength training, review HEP, add PWR! moves as indicated, incorporate balance intervention as indicated    PT Home Exercise Plan Provided 02/01/21    Consulted and Agree with Plan of Care Patient             Patient will benefit from skilled therapeutic intervention in order to improve the following deficits and impairments:  Decreased balance, Decreased endurance, Decreased mobility, Difficulty walking, Decreased activity tolerance, Decreased strength, Postural dysfunction, Pain  Visit Diagnosis: Difficulty in walking, not  elsewhere classified  Unsteadiness on feet  Muscle weakness (generalized)  Chronic low back pain, unspecified back pain laterality, unspecified whether sciatica present  Chronic left-sided low back pain without sciatica     Problem List Patient Active Problem List   Diagnosis Date Noted   Anemia, unspecified 02/07/2021   Neutropenia ([place]) 01/29/2021   Thrombocytopenia ([place]) 01/29/2021   Pneumonia 12/28/2020   Hyponatremia 12/22/2020   Bilateral leg weakness 12/20/2020   Prostate cancer screening 09/08/2020   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[place]) 09/06/2020   Low back pain of over 3 months duration 08/19/2019   Pulmonary hypertension ([place])    Insomnia 12/21/2018   Sleep apnea in adult 12/09/2018   Periodic limb movement disorder 12/09/2018   Abnormal findings on diagnostic imaging of lung 07/09/2018   Lung nodule 07/09/2018   Pulmonary nodules 05/18/2018   Wears hearing aid in both ears 05/17/2018   Leg pain, bilateral 02/20/2018   Dyspnea    Effort angina ([facility])    CKD (chronic kidney disease) stage 3, GFR 30-59 ml/min ([place]) 09/21/2017   History of skin cancer in adulthood 08/14/2016   Parkinson's disease ([place]) 08/09/2016   Bilateral carotid artery stenosis 04/02/2015   Vertigo, peripheral 10/17/2014   Benign prostatic hypertrophy with urinary frequency 01/31/2014   Encounter for Medicare annual wellness exam 07/02/2013   Obesity 04/03/2013   Other malaise and  fatigue 09/21/2012   Hyperlipidemia    Hypertension    3-vessel coronary artery disease    S/P CABG x 5    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 MRN: [id number] Date of Birth: 12/02/1945

## 2021-03-02 NOTE — Progress Notes (Signed)
[place]  Telephone:([phone] [phone]  ID: [name]. OB: [birth year]  MR#: [id number]  HBZ#:[id number]  Patient Care Team: [name], MD as PCP - General (Internal Medicine) Wellington Hampshire, MD as PCP - Cardiology (Cardiology) Isaias Cowman, MD (Internal Medicine) Tat, Eustace Quail, DO as Consulting Physician (Neurology)  I connected with [name]. on 03/10/21 at  3:30 PM EDT by video enabled telemedicine visit and verified that I am speaking with the correct person using two identifiers.   I discussed the limitations, risks, security and privacy concerns of performing an evaluation and management service by telemedicine and the availability of in-person appointments. I also discussed with the patient that there may be a patient responsible charge related to this service. The patient expressed understanding and agreed to proceed.   Other persons participating in the visit and their role in the encounter: Patient, MD.  Patient's location: Home. Provider's location: Clinic.  CHIEF COMPLAINT: Pancytopenia.  INTERVAL HISTORY: Patient agreed to video assisted telemedicine visit for further evaluation and discussion of his laboratory results.  Visit was completed over telephone secondary to difficulties.  Patient continues to feel well and remains asymptomatic. He has no neurologic complaints.  He denies any recent fevers or illnesses.  He has a good appetite and denies weight loss.  He denies any chest pain, shortness of breath, cough, or hemoptysis.  He denies any nausea, vomiting, constipation, or diarrhea.  He has no urinary complaints.  Patient offers no specific complaints today.  REVIEW OF SYSTEMS:   Review of Systems  Constitutional: Negative.  Negative for fever, malaise/fatigue and weight loss.  Respiratory: Negative.  Negative for cough, hemoptysis and shortness of breath.   Cardiovascular: Negative.  Negative for  chest pain and leg swelling.  Gastrointestinal: Negative.  Negative for abdominal pain.  Genitourinary: Negative.  Negative for dysuria.  Musculoskeletal: Negative.  Negative for back pain.  Skin: Negative.  Negative for rash.  Neurological: Negative.  Negative for dizziness, focal weakness, weakness and headaches.  Psychiatric/Behavioral: Negative.  The patient is not nervous/anxious.    As per HPI. Otherwise, a complete review of systems is negative.  PAST MEDICAL HISTORY: Past Medical History:  Diagnosis Date   3-vessel coronary artery disease    s/p  5 vessel CABG   Diabetes mellitus without complication ([place])    History of cardiac catheterization 2011   [facility]   Hyperlipidemia    Hypertension    Hypertriglyceridemia    Parkinson's disease ([place])    Pneumonia 12/28/2020   S/P CABG x 5 11-99   Vertigo     PAST SURGICAL HISTORY: Past Surgical History:  Procedure Laterality Date   CARDIAC CATHETERIZATION  05-19-2010   [facility]: Patent grafts. LIMA to LAD, SVG to D1, OM1 and RPDA   CORONARY ARTERY BYPASS GRAFT  03/1998   5 vessel, [facility]   RIGHT HEART CATH N/A 04/04/2019   Procedure: RIGHT HEART CATH;  Surgeon: Wellington Hampshire, MD;  Location: [facility];  Service: Cardiovascular;  Laterality: N/A;   RIGHT/LEFT HEART CATH AND CORONARY ANGIOGRAPHY N/A 02/01/2018   Procedure: RIGHT/LEFT HEART CATH AND CORONARY ANGIOGRAPHY;  Surgeon: Wellington Hampshire, MD;  Location: [facility];  Service: Cardiovascular;  Laterality: N/A;    FAMILY HISTORY: Family History  Problem Relation Age of Onset   Heart attack Mother 48   Hypertension Mother    Heart attack Father 80   Heart disease Father  Heart disease Brother    Healthy Son     ADVANCED DIRECTIVES (Y/N):  N  HEALTH MAINTENANCE: Social History   Tobacco Use   Smoking status: Never    Passive exposure: Yes   Smokeless tobacco: Never  Vaping Use   Vaping Use: Never used  Substance Use Topics    Alcohol use: Yes    Comment: occasional beer   Drug use: No     Colonoscopy:  PAP:  Bone density:  Lipid panel:  No Known Allergies  Current Outpatient Medications  Medication Sig Dispense Refill   aspirin EC 81 MG tablet Take 1 tablet (81 mg total) by mouth at bedtime. 30 tablet 11   carbidopa-levodopa (SINEMET CR) 50-200 MG tablet Take 1 tablet by mouth at bedtime. 90 tablet 2   carbidopa-levodopa (SINEMET IR) 25-100 MG tablet Take 2 tablets by mouth 3 (three) times daily. 2 at 8am, 2 at 11am, 2 at 2 pm, 1 at 5pm 630 tablet 1   carvedilol (COREG) 6.25 MG tablet Take 0.5 tablets (3.125 mg total) by mouth 2 (two) times daily.  0   clonazePAM (KLONOPIN) 0.5 MG tablet TAKE 1 AND 1/2 TABLET BY MOUTH EVERY NIGHT AT BEDTIME 45 tablet 1   isosorbide mononitrate (IMDUR) 60 MG 24 hr tablet Take 1 tablet (60 mg total) by mouth daily. 90 tablet 1   losartan (COZAAR) 25 MG tablet Take 1 tablet (25 mg total) by mouth daily. 90 tablet 1   Multiple Vitamin (MULTIVITAMIN) tablet Take 1 tablet by mouth daily.     nitroGLYCERIN (NITROSTAT) 0.4 MG SL tablet Place 1 tablet (0.4 mg total) under the tongue every 5 (five) minutes as needed for chest pain. 25 tablet 1   omeprazole (PRILOSEC) 20 MG capsule TAKE ONE CAPSULE BY MOUTH EVERY MORNING 90 capsule 0   polyethylene glycol (MIRALAX / GLYCOLAX) packet Take 17 g by mouth daily.      ranolazine (RANEXA) 1000 MG SR tablet Take 1 tablet (1,000 mg total) by mouth 2 (two) times daily. 60 tablet 5   rosuvastatin (CRESTOR) 10 MG tablet Take 1 tablet (10 mg total) by mouth daily. 90 tablet 3   tamsulosin (FLOMAX) 0.4 MG CAPS capsule TAKE ONE CAPSULE BY MOUTH DAILY 90 capsule 3   furosemide (LASIX) 20 MG tablet TAKE ONE TABLET BY MOUTH DAILY 90 tablet 2   No current facility-administered medications for this visit.    OBJECTIVE: There were no vitals filed for this visit.    There is no height or weight on file to calculate BMI.    ECOG FS:0 -  Asymptomatic  General: Well-developed, well-nourished, no acute distress. HEENT: Normocephalic. Neuro: Alert, answering all questions appropriately. Cranial nerves grossly intact. Psych: Normal affect.   LAB RESULTS:  Lab Results  Component Value Date   NA 136 12/20/2020   K 4.9 12/20/2020   CL 101 12/20/2020   CO2 29 12/20/2020   GLUCOSE 89 12/20/2020   BUN 14 12/20/2020   CREATININE 1.76 (H) 12/20/2020   CALCIUM 9.0 12/20/2020   PROT 7.0 11/08/2020   ALBUMIN 3.0 (L) 11/23/2020   AST 21 11/08/2020   ALT 7 11/08/2020   ALKPHOS 71 11/08/2020   BILITOT 1.5 (H) 11/08/2020   GFRNONAA 32 (L) 11/23/2020   GFRAA 43 (L) 03/31/2019    Lab Results  Component Value Date   WBC 4.1 02/07/2021   NEUTROABS 1.6 01/24/2021   HGB 11.0 (L) 02/07/2021   HCT 32.7 (L) 02/07/2021   MCV 101.6 (  H) 02/07/2021   PLT 140 (L) 02/07/2021     STUDIES: CT CHEST WO CONTRAST  Result Date: 03/05/2021 CLINICAL DATA:  Follow-up pulmonary nodules/opacities. Never smoker. EXAM: CT CHEST WITHOUT CONTRAST TECHNIQUE: Multidetector CT imaging of the chest was performed following the standard protocol without IV contrast. COMPARISON:  11/21/2020 chest CT. FINDINGS: Cardiovascular: Top-normal heart size. No significant pericardial effusion/thickening. Three-vessel coronary atherosclerosis status post CABG. Atherosclerotic nonaneurysmal thoracic aorta. Normal caliber pulmonary arteries. Mediastinum/Nodes: No discrete thyroid nodules. Unremarkable esophagus. No pathologically enlarged axillary, mediastinal or hilar lymph nodes, noting limited sensitivity for the detection of hilar adenopathy on this noncontrast study. Lungs/Pleura: No pneumothorax. No pleural effusion. No acute consolidative airspace disease or lung masses. Previously visualized patchy consolidation in the lingula and left lower lobe has resolved. Mildly thickened parenchymal band in the anterior peripheral basilar left lower lobe, compatible with  postinfectious/postinflammatory scarring. A few scattered solid right pulmonary nodules, largest 6 mm in the posterior apical right upper lobe (series 3/image 26), all stable since 11/17/2019 chest CT and considered benign. No new significant pulmonary nodules. Upper abdomen: Cholelithiasis. Musculoskeletal: No aggressive appearing focal osseous lesions. Intact sternotomy wires. Moderate thoracic spondylosis. IMPRESSION: 1. Interval resolution of previously visualized patchy consolidation in the lingula and left lower lobe seen on 11/21/2020 chest CT study, compatible with resolved multilobar pneumonia. 2. Scattered solid right pulmonary nodules are all stable since at least 11/17/2019 chest CT and considered benign. No active pulmonary disease. 3. Cholelithiasis. 4. Aortic Atherosclerosis (ICD10-I70.0). Electronically Signed   By: Ilona Sorrel M.D.   On: 03/05/2021 11:44    ASSESSMENT: Pancytopenia.  PLAN:    1.  Leukopenia: Resolved.  Patient's most recent white blood cell count 6.1. 2.  Anemia: Patient's hemoglobin is decreased, but stable at 11.0.  There is no evidence of hemolysis.  Iron stores, B12, and folate are all within normal limits.  SPEP is negative. Intelligen myeloid panel is pending at time of dictation.  No intervention is needed at this time.  Patient does not require bone marrow biopsy. 3.  Thrombocytopenia: Mild.  Patient's platelet count is 140.  Laboratory work as above.  Platelet antibody profile is negative as well. 4.  Macrocytosis: B12 and folate are within normal limits.  Patient may have underlying MDS, but this would require a bone marrow biopsy to diagnose which is not necessary at this point. 5.  Disposition: No intervention needed at this time.  Patient does not require bone marrow biopsy.  Return to clinic in 6 months with laboratory work evaluation.  If his blood counts remain essentially stable, can consider discharge to clinic.  I provided 20 minutes of face-to-face  video visit time during this encounter which included chart review, counseling, and coordination of care as documented above.   Patient expressed understanding and was in agreement with this plan. He also understands that He can call clinic at any time with any questions, concerns, or complaints.    Lloyd Huger, MD   03/10/2021 7:37 AM

## 2021-03-04 ENCOUNTER — Ambulatory Visit
Admission: RE | Admit: 2021-03-04 | Discharge: 2021-03-04 | Disposition: A | Discharge status: Home / Self Care | Payer: Medicare Other | Source: Ambulatory Visit | Attending: Adult Health | Admitting: Adult Health

## 2021-03-04 ENCOUNTER — Other Ambulatory Visit: Payer: Self-pay

## 2021-03-04 ENCOUNTER — Ambulatory Visit: Payer: Medicare Other | Admitting: Physical Therapy

## 2021-03-04 DIAGNOSIS — R262 Difficulty in walking, not elsewhere classified: Secondary | ICD-10-CM | POA: Diagnosis not present

## 2021-03-04 DIAGNOSIS — M6281 Muscle weakness (generalized): Secondary | ICD-10-CM | POA: Diagnosis not present

## 2021-03-04 DIAGNOSIS — I700 Atherosclerosis of aorta: Secondary | ICD-10-CM | POA: Diagnosis not present

## 2021-03-04 DIAGNOSIS — J690 Pneumonitis due to inhalation of food and vomit: Secondary | ICD-10-CM

## 2021-03-04 DIAGNOSIS — M545 Low back pain, unspecified: Secondary | ICD-10-CM | POA: Diagnosis not present

## 2021-03-04 DIAGNOSIS — R911 Solitary pulmonary nodule: Secondary | ICD-10-CM | POA: Diagnosis not present

## 2021-03-04 DIAGNOSIS — R918 Other nonspecific abnormal finding of lung field: Secondary | ICD-10-CM | POA: Diagnosis not present

## 2021-03-04 DIAGNOSIS — G8929 Other chronic pain: Secondary | ICD-10-CM | POA: Diagnosis not present

## 2021-03-04 DIAGNOSIS — R2681 Unsteadiness on feet: Secondary | ICD-10-CM

## 2021-03-04 NOTE — Therapy (Signed)
[place] [facility] SERVICES [address] [place], [place], [address] Phone: [phone]   Fax:  [phone]  Physical Therapy Treatment  Patient Details  Name: Johnathan Arnold. MRN: [id number] Date of Birth: [birth year] Referring Provider (PT): Dr. Carles Collet   Encounter Date: 03/04/2021   PT End of Session - 03/04/21 0955     Visit Number 8    Number of Visits 17    Date for PT Re-Evaluation 03/26/21    Authorization Type Medicare; start of care 01/29/21    Progress Note Due on Visit 10    PT Start Time 1884    PT Stop Time 0930    PT Time Calculation (min) 41 min    Equipment Utilized During Treatment Gait belt    Activity Tolerance Patient tolerated treatment well    Behavior During Therapy [facility] for tasks assessed/performed             Past Medical History:  Diagnosis Date   3-vessel coronary artery disease    s/p  5 vessel CABG   Diabetes mellitus without complication ([place])    History of cardiac catheterization 2011   [facility]   Hyperlipidemia    Hypertension    Hypertriglyceridemia    Parkinson's disease ([place])    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 LIMA to LAD, SVG to D1, OM1 and RPDA   CORONARY ARTERY BYPASS GRAFT  03/1998   5 vessel, [facility]   RIGHT HEART CATH N/A 04/04/2019   Procedure: RIGHT HEART CATH;  Surgeon: Wellington Hampshire, MD;  Location: [facility];  Service: Cardiovascular;  Laterality: N/A;   RIGHT/LEFT HEART CATH AND CORONARY ANGIOGRAPHY N/A 02/01/2018   Procedure: RIGHT/LEFT HEART CATH AND CORONARY ANGIOGRAPHY;  Surgeon: Wellington Hampshire, MD;  Location: [place] [facility];  Service: Cardiovascular;  Laterality: N/A;    There were no vitals filed for this visit.   Subjective Assessment - 03/04/21 0853     Subjective Pt reports no significant changes sicne last time. Reports HEP  has been going well.    Pertinent History 75 yo Male presents to therapy with history of Parkinson's Disease. He reports in June 2022 he got out of bed one morning and his legs just quit working. He was unable to get up and move around. He reports progressive weakness and not feeling well for about 4-5 weeks. He reports his legs feel weak and he is unable to walk as far. He reports soreness in calf. Patient presents to therapy without AD. he reports he used a cane for a few weeks but doesn't use it anymore. He reports falling a few weeks ago while at the beach. He reports he lost his balance in the sand. He reports 6 weeks ago he was on the portch swing and when he stood up he tried to get his balance and must have passed out. He denies any significant injuries with each fall. He did see his neurologist last week who increased his carbiopa/levadopa. He reports he has been doing okay with increased medication; He reports having pneumonia but wasn't aware and neurologist believes that exacerbated his Parkinson's Disease. He is doing [employer] which he does 3 days a week; He does have a resting tremor in RUE;  He also reports difficulty writing; He reports difficulty buttoning a  button but other dressing is not difficult. In addition he has a history of chronic low back pain which he states has been increasing in center of low back. He reports his back pain worse with standing and activity and alleviates with sitting;    Limitations Standing;Walking    How long can you sit comfortably? no difficulty; sitting alleviates pain;    How long can you stand comfortably? 10-15 min    How long can you walk comfortably? 5-10 min, worse with carrying something and with walking;    Diagnostic tests MRI in June 2022 Multilevel degenerative changes without stenosis;    Patient Stated Goals "Improve back pain, increase strength in legs and arms"    Currently in Pain? No/denies    Pain Score 0-No pain    Multiple  Pain Sites No            Treatment provided this session   Therex:  Octane level 4 x 4 min for aerobic priming    There Act:  TRX Squat with Jump 1 x 15 with HR, 1 x 10 with jump  TRX Row 2 x 10each  -cues for slight knee flexion to allow for improved posture  TRX lunge with shoulder HABD 2 x 8 on ea LE  -incorporating stretch for UE to target PD specific muscular tightness, cues for distance from anchor point to ensure proper stretch - HR and speed component added squat to promote quick and powerful movement    PWR! Up 2 x 10 from standing position  -VC, TC for incorporation of speed component -HR added to improve speed component    Sidestepping with BlackTB-2 x 12 each      The following activities were completed in parallel bars or at balance bar    Therex:   Leg press - 2 x 10 x 75 lb - reports weight "about right"  Leg press for plantarflexion 55 lb 2 x 10   HR on 1/2 foam 2 x 12  -for increased ROM and to allow DF stretch    Neuro re-ed:  Step taps on airex - to 8 inch step w/ 3 hedgehogs  -working on picking up feet and avoiding objects in path  1 round x 5 touches to each hedgehog with foot returing to airex pad each rep  1 round with sequenced left to right hedgehog taps prior to returning foot to airex pad       PRW! Rock - on [employer]  -2x 10 on each side  -cues for challenging BOS by shifting weight to one foot.      The following activities were completed in parallel bars or at balance bar  Airex HR 2*10 -no use of hands, good range of motion      Unless otherwise stated, SBA was provided and gait belt donned in order to ensure pt safety                            PT Education - 03/04/21 0901     Education Details exercise form and technique    Person(s) Educated Patient    Methods Explanation;Demonstration    Comprehension Verbalized understanding;Returned demonstration;Verbal cues required;Tactile cues  required;Need further instruction              PT Short Term Goals - 01/29/21 1018       PT SHORT TERM GOAL #1   Title Patient will be adherent to  HEP at least 3x a week to improve functional strength and balance for better safety at home.    Time 4    Period Weeks    Status New    Target Date 02/26/21      PT SHORT TERM GOAL #2   Title Patient will improve BUE gross strength to 4/5 to improve functional strength for lifting/carrying groceries    Time 4    Period Weeks    Status New               PT Long Term Goals - 01/29/21 1020       PT LONG TERM GOAL #1   Title Patient will increase BLE gross strength to 4+/5 as to improve functional strength for independent gait, increased standing tolerance and increased ADL ability.    Time 8    Period Weeks    Status New    Target Date 03/26/21      PT LONG TERM GOAL #2   Title Patient will report a worst pain of 3/10 on VAS in low back  to improve tolerance with ADLs and reduced symptoms with activities.    Time 8    Period Weeks    Status New    Target Date 03/26/21      PT LONG TERM GOAL #3   Title Patient will tolerate standing/walking for at least 30 min with a maximum of 3/10 back pain to exhibit improved tolerance with ADLs such as cooking/cleaning, grocery shopping.    Time 8    Period Weeks    Status New    Target Date 03/26/21      PT LONG TERM GOAL #4   Title Patient will increase 10 meter walk test to >1.24m/s as to improve gait speed for better community ambulation and to reduce fall risk.    Time 8    Period Weeks    Status New    Target Date 03/26/21      PT LONG TERM GOAL #5   Title Patient will improve FOTO score by at least 5% to indicate improved functional mobility with ADLs.    Time 8    Period Weeks    Status New    Target Date 03/26/21      Additional Long Term Goals   Additional Long Term Goals Yes      PT LONG TERM GOAL #6   Title Patient will increase six minute walk test  distance to >1300 for progression to community ambulator and improve gait ability close to age group norms.    Time 8    Period Weeks    Status New    Target Date 03/26/21                   Plan - 03/04/21 0955     Clinical Impression Statement Continued with current plan of care as laid out in evaluation and recent prior sessions. Pt remains motivated to advance progress toward goals in order to maximize independence and safety at home. Added repetition and difficulty to several exercsies as described in note today and pt tolerated well. PT did not some fatigue with activities but fatigue did not limit therapy sesion today. 2 LOB noted with weight shifting activity with increased repetitions.  Pt will continue to benefit from skilled physical therapy services in order to improve QOL, uimprove LE strength and prevent falls.    Personal Factors and Comorbidities Comorbidity 3+;Age    Comorbidities Parkinson's disease,  DM x2 (well controlled), chronic low back pain; s/p CABG x5, CKD III    Examination-Activity Limitations Locomotion Level;Squat;Stairs;Stand;Transfers    Examination-Participation Restrictions Community Activity;Shop;Volunteer;Yard Work    Stability/Clinical Decision Making Evolving/Moderate complexity    Rehab Potential Good    PT Frequency 2x / week    PT Duration 8 weeks    PT Treatment/Interventions Cryotherapy;Electrical Stimulation;Moist Heat;Gait training;Stair training;Functional mobility training;Therapeutic activities;Therapeutic exercise;Balance training;Neuromuscular re-education;Patient/family education;Manual techniques;Passive range of motion;Energy conservation    PT Next Visit Plan Continue strength training, review HEP, add PWR! moves as indicated, incorporate balance intervention as indicated    PT Home Exercise Plan Provided 02/01/21    Consulted and Agree with Plan of Care Patient             Patient will benefit from skilled therapeutic  intervention in order to improve the following deficits and impairments:  Decreased balance, Decreased endurance, Decreased mobility, Difficulty walking, Decreased activity tolerance, Decreased strength, Postural dysfunction, Pain  Visit Diagnosis: Difficulty in walking, not elsewhere classified  Unsteadiness on feet  Muscle weakness (generalized)     Problem List Patient Active Problem List   Diagnosis Date Noted   Anemia, unspecified 02/07/2021   Neutropenia ([place]) 01/29/2021   Thrombocytopenia ([place]) 01/29/2021   Pneumonia 12/28/2020   Hyponatremia 12/22/2020   Bilateral leg weakness 12/20/2020   Prostate cancer screening 09/08/2020   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[place]) 09/06/2020   Low back pain of over 3 months duration 08/19/2019   Pulmonary hypertension ([place])    Insomnia 12/21/2018   Sleep apnea in adult 12/09/2018   Periodic limb movement disorder 12/09/2018   Abnormal findings on diagnostic imaging of lung 07/09/2018   Lung nodule 07/09/2018   Pulmonary nodules 05/18/2018   Wears hearing aid in both ears 05/17/2018   Leg pain, bilateral 02/20/2018   Dyspnea    Effort angina ([facility])    CKD (chronic kidney disease) stage 3, GFR 30-59 ml/min ([place]) 09/21/2017   History of skin cancer in adulthood 08/14/2016   Parkinson's disease ([place]) 08/09/2016   Bilateral carotid artery stenosis 04/02/2015   Vertigo, peripheral 10/17/2014   Benign prostatic hypertrophy with urinary frequency 01/31/2014   Encounter for Medicare annual wellness exam 07/02/2013   Obesity 04/03/2013   Other malaise and fatigue 09/21/2012   Hyperlipidemia    Hypertension    3-vessel coronary artery disease    S/P CABG x 5    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 MRN: [id number] Date of Birth: 02-12-1946

## 2021-03-06 ENCOUNTER — Other Ambulatory Visit: Payer: Self-pay

## 2021-03-06 ENCOUNTER — Ambulatory Visit (INDEPENDENT_AMBULATORY_CARE_PROVIDER_SITE_OTHER): Payer: Medicare Other | Admitting: Pulmonary Disease

## 2021-03-06 ENCOUNTER — Encounter: Payer: Self-pay | Admitting: Pulmonary Disease

## 2021-03-06 VITALS — BP 130/70 | HR 54 | Temp 98.0°F | Ht 66.0 in | Wt 177.6 lb

## 2021-03-06 DIAGNOSIS — R911 Solitary pulmonary nodule: Secondary | ICD-10-CM | POA: Diagnosis not present

## 2021-03-06 DIAGNOSIS — G20 Parkinson's disease: Secondary | ICD-10-CM | POA: Diagnosis not present

## 2021-03-06 DIAGNOSIS — J690 Pneumonitis due to inhalation of food and vomit: Secondary | ICD-10-CM | POA: Diagnosis not present

## 2021-03-06 DIAGNOSIS — G4730 Sleep apnea, unspecified: Secondary | ICD-10-CM

## 2021-03-06 NOTE — Progress Notes (Addendum)
Subjective:    Patient ID: Johnathan Arnold., male    DOB: 05/31/1946, 75 y.o.   MRN: [id number] Chief Complaint  Patient presents with   Follow-up   HPI 75 year old male never smoker with underlying Parkinson's disease being followed for left lower lobe nodularity, pneumonia and sleep apnea.  He is following up on an episode of aspiration pneumonia diagnosed in June.  Last seen by me on 17 June and subsequently after that by Patricia Nettle on 22 July.  The patient presents today for follow-up.  This is a scheduled follow-up.  He voices no active complaint with regards to his respiratory status.  He had a chest CT performed on 26 September that shows complete resolution of his previously noted pneumonia.  All nodules on the left and right lung are stable and have been stable since 2020.  These are deemed benign.  Since his episode of aspiration he has been more careful with swallowing.  He does not describe dysphagia.  He had been unable to use his CPAP in June due to his aspiration episode.  He has now started using it again.  He does note however that he has had difficulties with a good facemask fitting.  Nasal pillows caused too much leak and full facemask hurts his nose.  He is not willing to go to [facility] for proper mask fitting.  We will try to work with his DME company.  Originally sleep study was performed 25 August 2018 patient exhibited AHI of 22.9 however on the supine position this is as high as 38.9.   Review of Systems A 10 point review of systems was performed and it is as noted above otherwise negative.    Objective:   Physical Exam BP 130/70 (BP Location: Left Arm, Patient Position: Sitting, Cuff Size: Normal)   Pulse (!) 54   Temp 98 F (36.7 C) (Oral)   Ht 5\' 6"  (1.676 m)   Wt 177 lb 9.6 oz (80.6 kg)   SpO2 99%   BMI 28.67 kg/m  GENERAL: Elderly gentleman, no acute distress.  Well-developed, well-nourished, presents fully ambulatory  HEAD:  Normocephalic, atraumatic. EYES: Pupils equal, round, reactive to light.  No scleral icterus. MOUTH: Nose/mouth/throat not examined due to masking requirements for COVID 19. NECK: Supple. No thyromegaly. Trachea midline. No JVD.  No adenopathy. PULMONARY: Good air entry bilaterally.  No adventitious sounds. CARDIOVASCULAR: S1 and S2. Regular rate and rhythm.  No rubs, murmurs or gallops heard. ABDOMEN: Benign. MUSCULOSKELETAL: No joint deformity, no clubbing, no edema. NEUROLOGIC: Pill rolling resting tremor noted particularly on the right upper extremity no other focal abnormality.  Gait is unassisted today.  Speech is fluent. SKIN: Intact,warm,dry.  On limited exam no rashes. PSYCH: Flat affect, behavior normal.  Representative image from CT scan performed 04 March 2021 showing complete resolution of left lower lobe infiltrate:     Assessment & Plan:     ICD-10-CM   1. Aspiration pneumonia of both lower lobes due to gastric secretions (HCC)  J69.0    This has now RESOLVED    2. Sleep apnea in adult  G47.30 AMB REFERRAL FOR DME   Difficulties tolerating mask Reassess CPAP mask Will have Dr. Halford Chessman, sleep medicine continue to follow    3. Lung nodule -LLL  R91.1    Has remained stable deemed now benign Initially noted 2020 Small scattered lung nodules on right, benign    4. Parkinson's disease ([place])  G20  This issue adds complexity to his management     Orders Placed This Encounter  Procedures   AMB REFERRAL FOR DME    Referral Priority:   Routine    Referral Type:   Durable Medical Equipment Purchase    Number of Visits Requested:   1     Patient's current issues to follow-up here are related to sleep apnea.  His lung nodules have been stable since 2020 and deemed benign.  We will set him up with Dr. Chesley Mires here for follow-up on his sleep apnea issues.  He is to follow-up in 3 to 4 months time.  In the interim we will have his DME company try to fit him with a  soft gel mask to see if the pressure on his nose is relieved and he can be compliant with his CPAP.  Johnathan Don, Johnathan Arnold [facility] PCCM [facility]    *This note was dictated using voice recognition software/Dragon.  Despite best efforts to proofread, errors can occur which can change the meaning.  Any change was purely unintentional.

## 2021-03-06 NOTE — Patient Instructions (Signed)
We will try to see if we can get a full facemask with memory foam cushion that may help with the pressure you feel in your nose.   We are sending you an appointment with Dr. Halford Chessman in 3 to 4 months so that he can follow your issues with sleep apnea.   Call sooner if you have any new problems.

## 2021-03-07 ENCOUNTER — Ambulatory Visit: Payer: Medicare Other | Admitting: Physical Therapy

## 2021-03-07 ENCOUNTER — Inpatient Hospital Stay (HOSPITAL_BASED_OUTPATIENT_CLINIC_OR_DEPARTMENT_OTHER): Payer: Medicare Other | Admitting: Oncology

## 2021-03-07 ENCOUNTER — Other Ambulatory Visit: Payer: Self-pay | Admitting: Emergency Medicine

## 2021-03-07 ENCOUNTER — Other Ambulatory Visit: Payer: Self-pay | Admitting: Cardiovascular Disease

## 2021-03-07 DIAGNOSIS — D61818 Other pancytopenia: Secondary | ICD-10-CM | POA: Diagnosis not present

## 2021-03-07 DIAGNOSIS — R2681 Unsteadiness on feet: Secondary | ICD-10-CM

## 2021-03-07 DIAGNOSIS — M545 Low back pain, unspecified: Secondary | ICD-10-CM | POA: Diagnosis not present

## 2021-03-07 DIAGNOSIS — R918 Other nonspecific abnormal finding of lung field: Secondary | ICD-10-CM | POA: Diagnosis not present

## 2021-03-07 DIAGNOSIS — M6281 Muscle weakness (generalized): Secondary | ICD-10-CM

## 2021-03-07 DIAGNOSIS — G8929 Other chronic pain: Secondary | ICD-10-CM

## 2021-03-07 DIAGNOSIS — R262 Difficulty in walking, not elsewhere classified: Secondary | ICD-10-CM

## 2021-03-07 NOTE — Progress Notes (Signed)
ATC x1.  LVM to return call. 

## 2021-03-07 NOTE — Progress Notes (Signed)
Pt confirmed availbility for virtual visit as scheduled. Educated patient on use of mychart virtual visit. Pt requesting phone call if unable to connect to visit [phone]. Pt has no other concerns at this time, just wanting to know test results.

## 2021-03-07 NOTE — Therapy (Addendum)
[place] MAIN [facility] SERVICES [address] [place], [place], [address] Phone: [phone]   Fax:  [phone]  Physical Therapy Treatment  Patient Details  Name: Johnathan Arnold. MRN: [id number] Date of Birth: April 18, 1946 Referring Provider (PT): Dr. Carles Collet   Encounter Date: 03/07/2021   PT End of Session - 03/07/21 0857     Visit Number 9    Number of Visits 17    Date for PT Re-Evaluation 03/26/21    Authorization Type Medicare; start of care 01/29/21    Progress Note Due on Visit 10    PT Start Time 0853    PT Stop Time 0931    PT Time Calculation (min) 38 min    Equipment Utilized During Treatment Gait belt    Activity Tolerance Patient tolerated treatment well    Behavior During Therapy [facility] for tasks assessed/performed             Past Medical History:  Diagnosis Date   3-vessel coronary artery disease    s/p  5 vessel CABG   Diabetes mellitus without complication ([place])    History of cardiac catheterization 2011   [facility]   Hyperlipidemia    Hypertension    Hypertriglyceridemia    Parkinson's disease ([place])    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 LIMA to LAD, SVG to D1, OM1 and RPDA   CORONARY ARTERY BYPASS GRAFT  03/1998   5 vessel, [facility]   RIGHT HEART CATH N/A 04/04/2019   Procedure: RIGHT HEART CATH;  Surgeon: Wellington Hampshire, MD;  Location: [place] [facility];  Service: Cardiovascular;  Laterality: N/A;   RIGHT/LEFT HEART CATH AND CORONARY ANGIOGRAPHY N/A 02/01/2018   Procedure: RIGHT/LEFT HEART CATH AND CORONARY ANGIOGRAPHY;  Surgeon: Wellington Hampshire, MD;  Location: [place] [facility];  Service: Cardiovascular;  Laterality: N/A;    There were no vitals filed for this visit.   Subjective Assessment - 03/07/21 0856     Subjective Pt reports no significant changes sicne last time. Reports no  pain at this time.    Pertinent History 75 yo Male presents to therapy with history of Parkinson's Disease. He reports in June 2022 he got out of bed one morning and his legs just quit working. He was unable to get up and move around. He reports progressive weakness and not feeling well for about 4-5 weeks. He reports his legs feel weak and he is unable to walk as far. He reports soreness in calf. Patient presents to therapy without AD. he reports he used a cane for a few weeks but doesn't use it anymore. He reports falling a few weeks ago while at the beach. He reports he lost his balance in the sand. He reports 6 weeks ago he was on the portch swing and when he stood up he tried to get his balance and must have passed out. He denies any significant injuries with each fall. He did see his neurologist last week who increased his carbiopa/levadopa. He reports he has been doing okay with increased medication; He reports having pneumonia but wasn't aware and neurologist believes that exacerbated his Parkinson's Disease. He is doing [employer] which he does 3 days a week; He does have a resting tremor in RUE;  He also reports difficulty writing; He reports difficulty buttoning a  button but other dressing is not difficult. In addition he has a history of chronic low back pain which he states has been increasing in center of low back. He reports his back pain worse with standing and activity and alleviates with sitting;    Limitations Standing;Walking    How long can you sit comfortably? no difficulty; sitting alleviates pain;    How long can you stand comfortably? 10-15 min    How long can you walk comfortably? 5-10 min, worse with carrying something and with walking;    Diagnostic tests MRI in June 2022 Multilevel degenerative changes without stenosis;    Currently in Pain? No/denies    Pain Score 0-No pain               Treatment provided this session  Therex:  Octane level 5 x 5 minutes  for aerobic priming and muscular endurance   HR on foam roller 2 x 12  -cues for foot placement in order to obtain proper stretch  B HH A to ensure full ROM       Neuro Re- Ed:   Airex step taps with hedgehogs on 12 in step 10 x tapping hedgehog directly anterior -10 x tapping anterior and anterolateral hedgehog to improve sequencing and further challenge balance responses and reactions  PWR! Rock standing on airex pad 10 x ea direction  PWR! Step on airex balance beam -cues for weight shifting onto stepping LE.  - no LOB   Sidestepping with black TB on aitrex balance beam  10 x with min HH support 10 x bidirectional without HH support, 1 LOB posteriorly corrected with UE support    There Act:  TRX Squat with jump in order to improve muscular power and to improve movement initiation 2 x 10  -some fatigue noted with increased reps but pt demonstrated good form.   Trx Row 2 x 10  -cues for distance from anchor point, instructed how to increase difficulty when performing in gym setting.   Trx Lunge with hor UE abduction 2 x 10 (ea LE)  -good form allowing for proper pec stretch   PWR! Up from chair with airex pad to challenge balance and stability. 2 x 10  -1 LOB posterior during concentric portion of exercise      Unless otherwise stated, SBA was provided and gait belt donned in order to ensure pt safety   Pt educated throughout session about proper posture and technique with exercises. Improved exercise technique, movement at target joints, use of target muscles after min to mod verbal, visual, tactile cues.                          PT Education - 03/07/21 0857     Education Details Exercise technique and form    Person(s) Educated Patient    Methods Explanation;Demonstration    Comprehension Verbalized understanding;Returned demonstration;Verbal cues required;Tactile cues required;Need further instruction              PT Short Term  Goals - 01/29/21 1018       PT SHORT TERM GOAL #1   Title Patient will be adherent to HEP at least 3x a week to improve functional strength and balance for better safety at home.    Time 4    Period Weeks    Status New    Target Date 02/26/21      PT SHORT TERM GOAL #2   Title Patient will  improve BUE gross strength to 4/5 to improve functional strength for lifting/carrying groceries    Time 4    Period Weeks    Status New               PT Long Term Goals - 01/29/21 1020       PT LONG TERM GOAL #1   Title Patient will increase BLE gross strength to 4+/5 as to improve functional strength for independent gait, increased standing tolerance and increased ADL ability.    Time 8    Period Weeks    Status New    Target Date 03/26/21      PT LONG TERM GOAL #2   Title Patient will report a worst pain of 3/10 on VAS in low back  to improve tolerance with ADLs and reduced symptoms with activities.    Time 8    Period Weeks    Status New    Target Date 03/26/21      PT LONG TERM GOAL #3   Title Patient will tolerate standing/walking for at least 30 min with a maximum of 3/10 back pain to exhibit improved tolerance with ADLs such as cooking/cleaning, grocery shopping.    Time 8    Period Weeks    Status New    Target Date 03/26/21      PT LONG TERM GOAL #4   Title Patient will increase 10 meter walk test to >1.10m/s as to improve gait speed for better community ambulation and to reduce fall risk.    Time 8    Period Weeks    Status New    Target Date 03/26/21      PT LONG TERM GOAL #5   Title Patient will improve FOTO score by at least 5% to indicate improved functional mobility with ADLs.    Time 8    Period Weeks    Status New    Target Date 03/26/21      Additional Long Term Goals   Additional Long Term Goals Yes      PT LONG TERM GOAL #6   Title Patient will increase six minute walk test distance to >1300 for progression to community ambulator and improve gait  ability close to age group norms.    Time 8    Period Weeks    Status New    Target Date 03/26/21                   Plan - 03/07/21 0858     Clinical Impression Statement Continued with current POC al laid out in previous session. Pt continues to demonstrate great motivation to participate in therapy sessions. Challenged pt balance today in ordert to improve anticipitory and reactive balace reactions and pt responded well. Pt had most difficulty with weight shifting on uneven surface and reported PWR! rock on airex as most difficulty exercise. Pt will continue to benefit from continued physical therpay services to improve his mobility, strength, balance and to achieve his therapy goals.    Personal Factors and Comorbidities Comorbidity 3+;Age    Comorbidities Parkinson's disease, DM x2 (well controlled), chronic low back pain; s/p CABG x5, CKD III    Examination-Activity Limitations Locomotion Level;Squat;Stairs;Stand;Transfers    Examination-Participation Restrictions Community Activity;Shop;Volunteer;Yard Work    Stability/Clinical Decision Making Evolving/Moderate complexity    Rehab Potential Good    PT Frequency 2x / week    PT Duration 8 weeks    PT Treatment/Interventions Cryotherapy;Electrical Stimulation;Moist Heat;Gait training;Stair training;Functional mobility  training;Therapeutic activities;Therapeutic exercise;Balance training;Neuromuscular re-education;Patient/family education;Manual techniques;Passive range of motion;Energy conservation    PT Next Visit Plan Continue strength training, review HEP, add PWR! moves as indicated, incorporate balance intervention as indicated    PT Home Exercise Plan Provided 02/01/21    Consulted and Agree with Plan of Care Patient             Patient will benefit from skilled therapeutic intervention in order to improve the following deficits and impairments:  Decreased balance, Decreased endurance, Decreased mobility, Difficulty  walking, Decreased activity tolerance, Decreased strength, Postural dysfunction, Pain  Visit Diagnosis: Difficulty in walking, not elsewhere classified  Unsteadiness on feet  Muscle weakness (generalized)  Chronic low back pain, unspecified back pain laterality, unspecified whether sciatica present  Chronic left-sided low back pain without sciatica     Problem List Patient Active Problem List   Diagnosis Date Noted   Anemia, unspecified 02/07/2021   Neutropenia ([place]) 01/29/2021   Thrombocytopenia ([place]) 01/29/2021   Pneumonia 12/28/2020   Hyponatremia 12/22/2020   Bilateral leg weakness 12/20/2020   Prostate cancer screening 09/08/2020   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[place]) 09/06/2020   Low back pain of over 3 months duration 08/19/2019   Pulmonary hypertension ([place])    Insomnia 12/21/2018   Sleep apnea in adult 12/09/2018   Periodic limb movement disorder 12/09/2018   Abnormal findings on diagnostic imaging of lung 07/09/2018   Lung nodule 07/09/2018   Pulmonary nodules 05/18/2018   Wears hearing aid in both ears 05/17/2018   Leg pain, bilateral 02/20/2018   Dyspnea    Effort angina (HCC)    CKD (chronic kidney disease) stage 3, GFR 30-59 ml/min ([place]) 09/21/2017   History of skin cancer in adulthood 08/14/2016   Parkinson's disease ([place]) 08/09/2016   Bilateral carotid artery stenosis 04/02/2015   Vertigo, peripheral 10/17/2014   Benign prostatic hypertrophy with urinary frequency 01/31/2014   Encounter for Medicare annual wellness exam 07/02/2013   Obesity 04/03/2013   Other malaise and fatigue 09/21/2012   Hyperlipidemia    Hypertension    3-vessel coronary artery disease    S/P CABG x 5    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 MRN: [id number] Date of Birth: 10-Nov-1945

## 2021-03-08 ENCOUNTER — Ambulatory Visit (INDEPENDENT_AMBULATORY_CARE_PROVIDER_SITE_OTHER): Payer: Medicare Other | Admitting: Internal Medicine

## 2021-03-08 ENCOUNTER — Other Ambulatory Visit: Payer: Self-pay

## 2021-03-08 ENCOUNTER — Encounter: Payer: Self-pay | Admitting: Internal Medicine

## 2021-03-08 DIAGNOSIS — D709 Neutropenia, unspecified: Secondary | ICD-10-CM

## 2021-03-08 DIAGNOSIS — E1121 Type 2 diabetes mellitus with diabetic nephropathy: Secondary | ICD-10-CM | POA: Diagnosis not present

## 2021-03-08 DIAGNOSIS — R29898 Other symptoms and signs involving the musculoskeletal system: Secondary | ICD-10-CM | POA: Diagnosis not present

## 2021-03-08 DIAGNOSIS — N1832 Chronic kidney disease, stage 3b: Secondary | ICD-10-CM | POA: Diagnosis not present

## 2021-03-08 DIAGNOSIS — I10 Essential (primary) hypertension: Secondary | ICD-10-CM | POA: Diagnosis not present

## 2021-03-08 DIAGNOSIS — I208 Other forms of angina pectoris: Secondary | ICD-10-CM

## 2021-03-08 NOTE — Progress Notes (Addendum)
Subjective:  Patient ID: Johnathan Rase., male    DOB: June 24, 1945  Age: 75 y.o. 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  HPI Johnathan Arnold. presents for  Chief Complaint  Patient presents with   Follow-up    6 month follow up    This visit occurred during the SARS-CoV-2 public health emergency.  Safety protocols were in place, including screening questions prior to the visit, additional usage of staff PPE, and extensive cleaning of exam room while observing appropriate contact time as indicated for disinfecting solutions.    1) Tpe 2 DM:  He  no longer has diabetes based om labs,  following a normla non diabetic diet  last a1c 5.3      2) Hypertension: taking losartan sporadically because of side effects .  Home readings have been  checked daily  and have been as high as 568 to 127 systolic in the morning and as low as 130 before he takes his medication .  Feels better when sbp is 140 or higher.  Feels "bad " (worn out ) when systolic is < 517.  Still taking Imdur and coreg.    3) Pancytopenia:  noted and referred to [facility] . Repeat WBC is normal.  Platelet studies normal.  Follow up 6 months   4) Weight loss reviewed:  16 lbs lost in June during pneumonia  illness,  appetite returning and weight regain  noted   5) has resumed CPAP again per pulmonology,  repetat chest CT normal.   6) Parkinson's disease:  resting tremor and balance issues addressed  . He is taking  Sinemet.  Colman Cater to PT and [employer]   Outpatient Medications Prior to Visit  Medication Sig Dispense Refill   aspirin EC 81 MG tablet Take 1 tablet (81 mg total) by mouth at bedtime. 30 tablet 11   carbidopa-levodopa (SINEMET CR) 50-200 MG tablet Take 1 tablet by mouth at bedtime. 90 tablet 2    carbidopa-levodopa (SINEMET IR) 25-100 MG tablet Take 2 tablets by mouth 3 (three) times daily. 2 at 8am, 2 at 11am, 2 at 2 pm, 1 at 5pm 630 tablet 1   carvedilol (COREG) 6.25 MG tablet Take 0.5 tablets (3.125 mg total) by mouth 2 (two) times daily.  0   clonazePAM (KLONOPIN) 0.5 MG tablet TAKE 1 AND 1/2 TABLET BY MOUTH EVERY NIGHT AT BEDTIME 45 tablet 1   furosemide (LASIX) 20 MG tablet TAKE ONE TABLET BY MOUTH DAILY 90 tablet 2   isosorbide mononitrate (IMDUR) 60 MG 24 hr tablet Take 1 tablet (60 mg total) by mouth daily. 90 tablet 1   losartan (COZAAR) 25 MG tablet Take 1 tablet (25 mg total) by mouth daily. 90 tablet 1   Multiple Vitamin (MULTIVITAMIN) tablet Take 1 tablet by mouth daily.     nitroGLYCERIN (NITROSTAT) 0.4 MG SL tablet Place 1 tablet (0.4 mg total) under the tongue every 5 (five) minutes as needed for chest pain. 25 tablet 1   omeprazole (PRILOSEC) 20 MG capsule TAKE ONE CAPSULE BY MOUTH EVERY MORNING 90 capsule 0   polyethylene glycol (MIRALAX / GLYCOLAX) packet Take 17 g by mouth daily.      ranolazine (RANEXA) 1000 MG SR tablet Take 1 tablet (1,000 mg total) by mouth 2 (two) times daily. 60 tablet  5   rosuvastatin (CRESTOR) 10 MG tablet Take 1 tablet (10 mg total) by mouth daily. 90 tablet 3   tamsulosin (FLOMAX) 0.4 MG CAPS capsule TAKE ONE CAPSULE BY MOUTH DAILY 90 capsule 3   No facility-administered medications prior to visit.    Review of Systems;  Patient denies headache, fevers, malaise, unintentional weight loss, skin rash, eye pain, sinus congestion and sinus pain, sore throat, dysphagia,  hemoptysis , cough, dyspnea, wheezing, chest pain, palpitations, orthopnea, edema, abdominal pain, nausea, melena, diarrhea, constipation, flank pain, dysuria, hematuria, urinary  Frequency, nocturia, numbness, tingling, seizures,  Focal weakness, Loss of consciousness,  Tremor, insomnia, depression, anxiety, and suicidal ideation.      Objective:  BP (!) 144/62 (BP Location:  Left Arm, Patient Position: Sitting, Cuff Size: Normal)   Pulse (!) 51   Temp (!) 96.7 F (35.9 C) (Temporal)   Ht 5\' 6"  (1.676 m)   Wt 178 lb (80.7 kg)   SpO2 98%   BMI 28.73 kg/m   BP Readings from Last 3 Encounters:  03/08/21 (!) 144/62  03/06/21 130/70  02/14/21 138/70    Wt Readings from Last 3 Encounters:  03/08/21 178 lb (80.7 kg)  03/06/21 177 lb 9.6 oz (80.6 kg)  02/14/21 178 lb (80.7 kg)    General appearance: alert, cooperative and appears stated age Ears: normal TM's and external ear canals both ears Throat: lips, mucosa, and tongue normal; teeth and gums normal Neck: no adenopathy, no carotid bruit, supple, symmetrical, trachea midline and thyroid not enlarged, symmetric, no tenderness/mass/nodules Back: symmetric, no curvature. ROM normal. No CVA tenderness. Lungs: clear to auscultation bilaterally Heart: regular rate and rhythm, S1, S2 normal, no murmur, click, rub or gallop Abdomen: soft, non-tender; bowel sounds normal; no masses,  no organomegaly Pulses: 2+ and symmetric Skin: Skin color, texture, turgor normal. No rashes or lesions Lymph nodes: Cervical, supraclavicular, and axillary nodes normal.  Lab Results  Component Value Date   HGBA1C 5.3 01/24/2021   HGBA1C 5.8 09/06/2020   HGBA1C 5.8 08/17/2019    Lab Results  Component Value Date   CREATININE 1.76 (H) 12/20/2020   CREATININE 2.10 (H) 11/23/2020   CREATININE 2.15 (H) 11/08/2020    Lab Results  Component Value Date   WBC 4.1 02/07/2021   HGB 11.0 (L) 02/07/2021   HCT 32.7 (L) 02/07/2021   PLT 140 (L) 02/07/2021   GLUCOSE 89 12/20/2020   CHOL 122 01/24/2021   TRIG 111.0 01/24/2021   HDL 62.80 01/24/2021   LDLDIRECT 38.0 08/06/2015   LDLCALC 37 01/24/2021   ALT 7 11/08/2020   AST 21 11/08/2020   NA 136 12/20/2020   K 4.9 12/20/2020   CL 101 12/20/2020   CREATININE 1.76 (H) 12/20/2020   BUN 14 12/20/2020   CO2 29 12/20/2020   TSH 1.241 11/23/2020   PSA 1.10 09/06/2020    HGBA1C 5.3 01/24/2021   MICROALBUR <0.7 08/17/2019    CT CHEST WO CONTRAST  Result Date: 03/05/2021 CLINICAL DATA:  Follow-up pulmonary nodules/opacities. Never smoker. EXAM: CT CHEST WITHOUT CONTRAST TECHNIQUE: Multidetector CT imaging of the chest was performed following the standard protocol without IV contrast. COMPARISON:  11/21/2020 chest CT. FINDINGS: Cardiovascular: Top-normal heart size. No significant pericardial effusion/thickening. Three-vessel coronary atherosclerosis status post CABG. Atherosclerotic nonaneurysmal thoracic aorta. Normal caliber pulmonary arteries. Mediastinum/Nodes: No discrete thyroid nodules. Unremarkable esophagus. No pathologically enlarged axillary, mediastinal or hilar lymph nodes, noting limited sensitivity for the detection of hilar adenopathy on this noncontrast study. Lungs/Pleura: No pneumothorax.  No pleural effusion. No acute consolidative airspace disease or lung masses. Previously visualized patchy consolidation in the lingula and left lower lobe has resolved. Mildly thickened parenchymal band in the anterior peripheral basilar left lower lobe, compatible with postinfectious/postinflammatory scarring. A few scattered solid right pulmonary nodules, largest 6 mm in the posterior apical right upper lobe (series 3/image 26), all stable since 11/17/2019 chest CT and considered benign. No new significant pulmonary nodules. Upper abdomen: Cholelithiasis. Musculoskeletal: No aggressive appearing focal osseous lesions. Intact sternotomy wires. Moderate thoracic spondylosis. IMPRESSION: 1. Interval resolution of previously visualized patchy consolidation in the lingula and left lower lobe seen on 11/21/2020 chest CT study, compatible with resolved multilobar pneumonia. 2. Scattered solid right pulmonary nodules are all stable since at least 11/17/2019 chest CT and considered benign. No active pulmonary disease. 3. Cholelithiasis. 4. Aortic Atherosclerosis (ICD10-I70.0).  Electronically Signed   By: Ilona Sorrel M.D.   On: 03/05/2021 11:44    Assessment & Plan:   Problem List Items Addressed This Visit       Unprioritized   Bilateral leg weakness    Improving with PT       CKD (chronic kidney disease) stage 3, GFR 30-59 ml/min (HCC)    Progressive,  With  anemia . Follow up  labs along with EPO level note improved Cr and stable hgb  Lab Results  Component Value Date   CREATININE 1.76 (H) 12/20/2020   Lab Results  Component Value Date   WBC 4.1 02/07/2021   HGB 11.0 (L) 02/07/2021   HCT 32.7 (L) 02/07/2021   MCV 101.6 (H) 02/07/2021   PLT 140 (L) 02/07/2021   .      Hypertension    He has had recurrent malaise with low BP, likely due to parkinson induced orthostasis.  Advised him to take losartan at night instead of AM.  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  Fasting sugars and a1c on a non restricted diet have been normal       There are no discontinued medications. I provided  30 minutes of  face-to-face time during this encounter reviewing patient's current problems and past surgeries, labs and imaging studies, providing counseling on the above mentioned problems , and coordination  of care .   [name], MD

## 2021-03-08 NOTE — Assessment & Plan Note (Signed)
He has had recurrent malaise with low BP, likely due to parkinson induced orthostasis.  Advised him to take losartan at night instead of AM.  Continue carvedilol and Imdur given CAD and angina

## 2021-03-08 NOTE — Patient Instructions (Addendum)
I recommend changing your losartan to evening ,  take it with your evening dose of carvedilol, instead of  in the morning so you can be more consistent with it due to better toleration   You are technically NO LONGER A DIABETIC.  You can use the monitor to check a 2 hour post prandial after cake or some kind of sweet  dessert.  It should be < 160  Return in 6 months

## 2021-03-08 NOTE — Assessment & Plan Note (Signed)
Mild,  Resolved with repeat CBC

## 2021-03-08 NOTE — Assessment & Plan Note (Signed)
Progressive,  With  anemia . Follow up  labs along with EPO level note improved Cr and stable hgb  Lab Results  Component Value Date   CREATININE 1.76 (H) 12/20/2020   Lab Results  Component Value Date   WBC 4.1 02/07/2021   HGB 11.0 (L) 02/07/2021   HCT 32.7 (L) 02/07/2021   MCV 101.6 (H) 02/07/2021   PLT 140 (L) 02/07/2021   .

## 2021-03-08 NOTE — Assessment & Plan Note (Signed)
He no longer meets criteria for diagnosis.  Fasting sugars and a1c on a non restricted diet have been normal

## 2021-03-08 NOTE — Assessment & Plan Note (Signed)
Improving with PT

## 2021-03-11 ENCOUNTER — Encounter: Payer: Self-pay | Admitting: *Deleted

## 2021-03-11 NOTE — Progress Notes (Signed)
ATC x2, no answer.  LVM to return call.  Unable to reach letter sent.  Closing per policy.

## 2021-03-12 ENCOUNTER — Other Ambulatory Visit: Payer: Self-pay

## 2021-03-12 ENCOUNTER — Ambulatory Visit: Payer: Medicare Other | Attending: Neurology | Admitting: Physical Therapy

## 2021-03-12 DIAGNOSIS — G8929 Other chronic pain: Secondary | ICD-10-CM | POA: Diagnosis not present

## 2021-03-12 DIAGNOSIS — R2681 Unsteadiness on feet: Secondary | ICD-10-CM | POA: Insufficient documentation

## 2021-03-12 DIAGNOSIS — R262 Difficulty in walking, not elsewhere classified: Secondary | ICD-10-CM | POA: Insufficient documentation

## 2021-03-12 DIAGNOSIS — M545 Low back pain, unspecified: Secondary | ICD-10-CM | POA: Diagnosis not present

## 2021-03-12 DIAGNOSIS — M6281 Muscle weakness (generalized): Secondary | ICD-10-CM | POA: Insufficient documentation

## 2021-03-12 NOTE — Therapy (Signed)
[place] MAIN [facility] SERVICES [place], [place], [address] Phone: [phone]   Fax:  [phone]  Physical Therapy Treatment/ Physical Therapy Progress Note   Dates of reporting period  01/29/21   to   03/12/21   Patient Details  Name: Johnathan Arnold. MRN: [id number] Date of Birth: 11/23/1945 Referring Provider (PT): Dr. Carles Collet   Encounter Date: 03/12/2021   PT End of Session - 03/12/21 1020     Visit Number 10    Number of Visits 17    Date for PT Re-Evaluation 03/26/21    Authorization Type Medicare; start of care 01/29/21- 03/26/21    Progress Note Due on Visit 10    PT Start Time 1019    PT Stop Time 1100    PT Time Calculation (min) 41 min    Equipment Utilized During Treatment Gait belt    Activity Tolerance Patient tolerated treatment well    Behavior During Therapy WFL for tasks assessed/performed             Past Medical History:  Diagnosis Date   3-vessel coronary artery disease    s/p  5 vessel CABG   Diabetes mellitus without complication ([place])    History of cardiac catheterization 2011   [place]   Hyperlipidemia    Hypertension    Hypertriglyceridemia    Parkinson's disease ([place])    Pneumonia 12/28/2020   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 LIMA to LAD, SVG to D1, OM1 and RPDA   CORONARY ARTERY BYPASS GRAFT  03/1998   5 vessel, Procedure [facility]   RIGHT HEART CATH N/A 04/04/2019   Procedure: RIGHT HEART CATH;  Surgeon: Wellington Hampshire, MD;  Location: [place] [facility];  Service: Cardiovascular;  Laterality: N/A;   RIGHT/LEFT HEART CATH AND CORONARY ANGIOGRAPHY N/A 02/01/2018   Procedure: RIGHT/LEFT HEART CATH AND CORONARY ANGIOGRAPHY;  Surgeon: Wellington Hampshire, MD;  Location: [place] [facility];  Service: Cardiovascular;  Laterality: N/A;    There were no vitals filed for this  visit.       [facility] PT Assessment - 03/12/21 0001       Standardized Balance Assessment   Standardized Balance Assessment Mini-BESTest    10 Meter Walk 1.04 no AD      Mini-BESTest   Sit To Stand Normal: Comes to stand without use of hands and stabilizes independently.    Rise to Toes Normal: Stable for 3 s with maximum height.    Stand on one leg (left) Normal: 20 s.    Stand on one leg (right) Normal: 20 s.    Stand on one leg - lowest score 2    Compensatory Stepping Correction - Forward Normal: Recovers independently with a single, large step (second realignement is allowed).    Compensatory Stepping Correction - Backward Normal: Recovers independently with a single, large step    Compensatory Stepping Correction - Left Lateral Moderate: Several steps to recover equilibrium    Compensatory Stepping Correction - Right Lateral Moderate: Several steps to recover equilibrium    Stepping Corredtion Lateral - lowest score 1    Stance - Feet together, eyes open, firm surface  Normal: 30s    Stance - Feet together, eyes closed, foam surface  Normal: 30s    Incline - Eyes Closed Moderate: Stands independently <  30s OR aligns with surface    Change in Gait Speed Normal: Significantly changes walkling speed without imbalance    Walk with head turns - Horizontal Moderate: performs head turns with reduction in gait speed.    Walk with pivot turns Normal: Turns with feet close FAST (< 3 steps) with good balance.    Step over obstacles Normal: Able to step over box with minimal change of gait speed and with good balance.    Timed UP & GO with Dual Task Moderate: Dual Task affects either counting OR walking (>10%) when compared to the TUG without Dual Task.   10.29 normal, 15.51 dual task   Mini-BEST total score 24      Dynamic Gait Index   Level Surface Normal    Change in Gait Speed Normal    Gait with Horizontal Head Turns Mild Impairment    Gait with Vertical Head Turns Normal    Gait  and Pivot Turn Normal    Step Over Obstacle Normal    Step Around Obstacles Normal    Steps Mild Impairment    Total Score 22      Timed Up and Go Test   Normal TUG (seconds) 10.3    Cognitive TUG (seconds) 15.5             Treatment provided this session  Therex:   Treatment today consisted of assessing pt goals utilizing standardized assessments and pt goals as determined during initial evaluation.   Octane level 5 x 5 min for aerobic priming        Neuro Re- Ed:   Mini BEST test as described above. Limitation in reactive strategies, gait with head turns and static balance on incline surface.     There Act:  6 MWT distance 1160 feet, no low back pain, no LOB, no noticeable fatigue throughout  10 MWT: 1.20m/s indicating safe community ambulation speed   FOTO:68  Unless otherwise stated, SBA was provided and gait belt donned in order to ensure pt safety  Pt educated throughout session about proper posture and technique with exercises. Improved exercise technique, movement at target joints, use of target muscles after min to mod verbal, visual, tactile cues.   Note: Portions of this document were prepared using Dragon voice recognition software and although reviewed may contain unintentional dictation errors in syntax, grammar, or spelling.                        PT Short Term Goals - 03/12/21 1023       PT SHORT TERM GOAL #1   Title Patient will be adherent to HEP at least 3x a week to improve functional strength and balance for better safety at home.    Baseline Pt performing his HEP based on subjective reports (10/17/2019)    Time 4    Period Weeks    Status New    Target Date 02/26/21      PT SHORT TERM GOAL #2   Title Patient will improve BUE gross strength to 4/5 to improve functional strength for lifting/carrying groceries    Baseline 5/5 strength with all major planes of motion, no reports of arm pain, some tremor with max  effort but no limit on funciton    Time 4    Period Weeks    Status Achieved               PT Long Term Goals - 03/12/21 1027  PT LONG TERM GOAL #1   Title Patient will increase BLE gross strength to 4+/5 as to improve functional strength for independent gait, increased standing tolerance and increased ADL ability.    Time 8    Period Weeks    Status Partially Met      PT LONG TERM GOAL #2   Title Patient will report a worst pain of 3/10 on VAS in low back  to improve tolerance with ADLs and reduced symptoms with activities.    Baseline 4-5 at times when doing yardwork, pt reports it does not bother him unless he is doing something.    Time 8    Period Weeks    Status New      PT LONG TERM GOAL #3   Title Patient will tolerate standing/walking for at least 30 min with a maximum of 3/10 back pain to exhibit improved tolerance with ADLs such as cooking/cleaning, grocery shopping.    Baseline 4-5 pain when ambulating prolonged distances. 2-3/10 when standing for extended period of time    Time 8    Period Weeks    Status New      PT LONG TERM GOAL #4   Title Patient will increase 10 meter walk test to >1.28m/s as to improve gait speed for better community ambulation and to reduce fall risk.    Baseline 1.17m/s    Time 8    Period Weeks    Status New      PT LONG TERM GOAL #5   Title Patient will improve FOTO score by at least 5% to indicate improved functional mobility with ADLs.    Baseline 58.3 initial eval,    Time 8    Period Weeks    Status New      PT LONG TERM GOAL #6   Title Patient will increase six minute walk test distance to >1300 for progression to community ambulator and improve gait ability close to age group norms.    Baseline 1160 at PN 10/4    Time 8    Period Weeks    Status New                   Plan - 03/12/21 1045     Clinical Impression Statement Patient presents to physical therapy following 10 physical therapy visits for  progress note in the session.  Patient's goals were assessed.  Patient is compliant with home exercise program and is able to complete on a regular basis and has no questions regarding home exercises.  Patient has made significant progress with upper extremity strength as well as with 6-minute walk test, 10 m walk test, and his F OTO score.  Patient is now able to ambulate increased distance without complaints of low back pain and is able to ambulate speed and distances able to be safe with community ambulation.  Patient does report some impairments with his balance and reduced balance impairments were evident and his mini BEST test.  In future sessions patient will benefit from continued lower extremity strengthening as well as further incorporation of balance related exercises in order to improve his balance, reduce fall risk, continue to improve his endurance and ability to complete daily activities without limitations. Patient's condition has the potential to improve in response to therapy. Maximum improvement is yet to be obtained. The anticipated improvement is attainable and reasonable in a generally predictable time.      Personal Factors and Comorbidities Comorbidity 3+;Age    Comorbidities  Parkinson's disease, DM x2 (well controlled), chronic low back pain; s/p CABG x5, CKD III    Examination-Activity Limitations Locomotion Level;Squat;Stairs;Stand;Transfers    Examination-Participation Restrictions Community Activity;Shop;Volunteer;Yard Work    Stability/Clinical Decision Making Evolving/Moderate complexity    Rehab Potential Good    PT Frequency 2x / week    PT Duration 8 weeks    PT Treatment/Interventions Cryotherapy;Electrical Stimulation;Moist Heat;Gait training;Stair training;Functional mobility training;Therapeutic activities;Therapeutic exercise;Balance training;Neuromuscular re-education;Patient/family education;Manual techniques;Passive range of motion;Energy conservation    PT Next  Visit Plan Continue strength training, review HEP, add PWR! moves as indicated, incorporate balance intervention as indicated    PT Home Exercise Plan Provided 02/01/21    Consulted and Agree with Plan of Care Patient             Patient will benefit from skilled therapeutic intervention in order to improve the following deficits and impairments:  Decreased balance, Decreased endurance, Decreased mobility, Difficulty walking, Decreased activity tolerance, Decreased strength, Postural dysfunction, Pain  Visit Diagnosis: Difficulty in walking, not elsewhere classified  Unsteadiness on feet  Muscle weakness (generalized)     Problem List Patient Active Problem List   Diagnosis Date Noted   Anemia, unspecified 02/07/2021   Neutropenia ([place]) 01/29/2021   Thrombocytopenia ([place]) 01/29/2021   Hyponatremia 12/22/2020   Bilateral leg weakness 12/20/2020   Prostate cancer screening 09/08/2020   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[place]) 09/06/2020   Low back pain of over 3 months duration 08/19/2019   Pulmonary hypertension ([place])    Insomnia 12/21/2018   Sleep apnea in adult 12/09/2018   Periodic limb movement disorder 12/09/2018   Lung nodule 07/09/2018   Pulmonary nodules 05/18/2018   Wears hearing aid in both ears 05/17/2018   Leg pain, bilateral 02/20/2018   Dyspnea    Effort angina (HCC)    CKD (chronic kidney disease) stage 3, GFR 30-59 ml/min ([place]) 09/21/2017   History of skin cancer in adulthood 08/14/2016   Parkinson's disease ([place]) 08/09/2016   Bilateral carotid artery stenosis 04/02/2015   Vertigo, peripheral 10/17/2014   Benign prostatic hypertrophy with urinary frequency 01/31/2014   Encounter for Medicare annual wellness exam 07/02/2013   Obesity 04/03/2013   Other malaise and fatigue 09/21/2012   Hyperlipidemia    Hypertension    3-vessel coronary artery disease    S/P CABG x 5    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 MRN: [id number] Date of Birth: Oct 25, 1945

## 2021-03-14 DIAGNOSIS — D2262 Melanocytic nevi of left upper limb, including shoulder: Secondary | ICD-10-CM | POA: Diagnosis not present

## 2021-03-14 DIAGNOSIS — L570 Actinic keratosis: Secondary | ICD-10-CM | POA: Diagnosis not present

## 2021-03-14 DIAGNOSIS — X32XXXA Exposure to sunlight, initial encounter: Secondary | ICD-10-CM | POA: Diagnosis not present

## 2021-03-14 DIAGNOSIS — D225 Melanocytic nevi of trunk: Secondary | ICD-10-CM | POA: Diagnosis not present

## 2021-03-14 DIAGNOSIS — Z85828 Personal history of other malignant neoplasm of skin: Secondary | ICD-10-CM | POA: Diagnosis not present

## 2021-03-14 DIAGNOSIS — D2261 Melanocytic nevi of right upper limb, including shoulder: Secondary | ICD-10-CM | POA: Diagnosis not present

## 2021-03-14 DIAGNOSIS — D2272 Melanocytic nevi of left lower limb, including hip: Secondary | ICD-10-CM | POA: Diagnosis not present

## 2021-03-15 ENCOUNTER — Other Ambulatory Visit: Payer: Self-pay

## 2021-03-15 ENCOUNTER — Encounter: Payer: Self-pay | Admitting: Physical Therapy

## 2021-03-15 ENCOUNTER — Ambulatory Visit: Payer: Medicare Other | Admitting: Physical Therapy

## 2021-03-15 DIAGNOSIS — R262 Difficulty in walking, not elsewhere classified: Secondary | ICD-10-CM

## 2021-03-15 DIAGNOSIS — R2681 Unsteadiness on feet: Secondary | ICD-10-CM | POA: Diagnosis not present

## 2021-03-15 DIAGNOSIS — G8929 Other chronic pain: Secondary | ICD-10-CM | POA: Diagnosis not present

## 2021-03-15 DIAGNOSIS — M6281 Muscle weakness (generalized): Secondary | ICD-10-CM | POA: Diagnosis not present

## 2021-03-15 DIAGNOSIS — M545 Low back pain, unspecified: Secondary | ICD-10-CM | POA: Diagnosis not present

## 2021-03-15 NOTE — Therapy (Signed)
[place] MAIN [facility] SERVICES [address] [place], [place], [address] Phone: [phone]   Fax:  [phone]  Physical Therapy Treatment  Patient Details  Name: Johnathan Arnold. MRN: [id number] Date of Birth: 12-10-1945 Referring Provider (PT): Dr. Carles Collet   Encounter Date: 03/15/2021   PT End of Session - 03/15/21 1024     Visit Number 11    Number of Visits 17    Date for PT Re-Evaluation 03/26/21    Authorization Type Medicare; start of care 01/29/21- 03/26/21    Progress Note Due on Visit 10    PT Start Time 1018    PT Stop Time 1100    PT Time Calculation (min) 42 min    Equipment Utilized During Treatment Gait belt    Activity Tolerance Patient tolerated treatment well    Behavior During Therapy WFL for tasks assessed/performed             Past Medical History:  Diagnosis Date   3-vessel coronary artery disease    s/p  5 vessel CABG   Diabetes mellitus without complication ([place])    History of cardiac catheterization 2011   [facility]   Hyperlipidemia    Hypertension    Hypertriglyceridemia    Parkinson's disease ([place])    Pneumonia 12/28/2020   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 LIMA to LAD, SVG to D1, OM1 and RPDA   CORONARY ARTERY BYPASS GRAFT  03/1998   5 vessel, [facility]   RIGHT HEART CATH N/A 04/04/2019   Procedure: RIGHT HEART CATH;  Surgeon: Wellington Hampshire, MD;  Location: [place] CV LAB;  Service: Cardiovascular;  Laterality: N/A;   RIGHT/LEFT HEART CATH AND CORONARY ANGIOGRAPHY N/A 02/01/2018   Procedure: RIGHT/LEFT HEART CATH AND CORONARY ANGIOGRAPHY;  Surgeon: Wellington Hampshire, MD;  Location: [place] CV LAB;  Service: Cardiovascular;  Laterality: N/A;    There were no vitals filed for this visit.   Subjective Assessment - 03/15/21 1022     Subjective Pt rpeots some soreness in  his calves bilaterally, believes could be combiunation of therpay and rock steady. No new complaints at this time.    Patient is accompained by: Family member    Pertinent History 75 yo Male presents to therapy with history of Parkinson's Disease. He reports in June 2022 he got out of bed one morning and his legs just quit working. He was unable to get up and move around. He reports progressive weakness and not feeling well for about 4-5 weeks. He reports his legs feel weak and he is unable to walk as far. He reports soreness in calf. Patient presents to therapy without AD. he reports he used a cane for a few weeks but doesn't use it anymore. He reports falling a few weeks ago while [place] the beach. He reports he lost his balance in the sand. He reports 6 weeks ago he was on the portch swing and when he stood up he tried to get his balance and must have passed out. He denies any significant injuries with each fall. He did see his neurologist last week who increased his carbiopa/levadopa. He reports he has been doing okay with increased medication; He reports having pneumonia but wasn't aware and neurologist believes that exacerbated his Parkinson's Disease. He is doing [employer] which he does  3 days a week; He does have a resting tremor in RUE;  He also reports difficulty writing; He reports difficulty buttoning a button but other dressing is not difficult. In addition he has a history of chronic low back pain which he states has been increasing in center of low back. He reports his back pain worse with standing and activity and alleviates with sitting;    Limitations Standing;Walking    How long can you sit comfortably? no difficulty; sitting alleviates pain;    How long can you stand comfortably? 10-15 min    How long can you walk comfortably? 5-10 min, worse with carrying something and with walking;    Diagnostic tests MRI in June 2022 Multilevel degenerative changes without stenosis;    Patient  Stated Goals "Improve back pain, increase strength in legs and arms"    Currently in Pain? No/denies            Treatment provided this session  Therex:   Octane level 5 x 5 min for aerobic priming -Cues for maintenance of speed    Neuro Re- Ed:   Step up to BOSU ball (soft side) and ball toss to mirror and catch, CGA.  -Pt reports as difficult 10 x ant step up/down -2x10x lateral step up and down (10 on each side followed by brief rest period)  -In order to improve balance reactive strategies when sidestepping as well as to incorporate dual motor task having to manage ball catch and throw and balance simultaneously. -Patient rated is very difficult  Sidestepping on airex beam x 5 laps with tep off to hedgehog (light touch)  -pt reports easy-med, no loss of balance noted  Step up and over BOSU ball.  X10 times with each lower extremity. -Cues for slow and steady movement in order to challenge balance strategies.  Airex balance beam walking with airex pad on each end for turning around with dual task of naming golfers and golf courses, colleges, etc - 2 x 1 minute -difficulty with balance beam, 1 lob corrected with stepping strategy, difficulty maintinag balance with tandem stance on the unsteady surface indicating difficulty with impaired proprioception.  -Patient rated as very difficult  Patient reports all dual task activities as increase in difficulty.  Unless otherwise stated, CGA was provided and gait belt donned in order to ensure pt safety with the above activities     There Act: TRX Squat with Jump: to improve muscular power.  -2 x 12 -good efficacy with task, min cues required this session.  -Some fatigue noted at end of task as evidenced by increased respiration rate  TRX lunge: for improvement with bewnding and kneeling tasks as well as incorporating UE stretch to musculature that decraeases inflexibility with PD.  -2 x 12   Supervision level assist  with therapeutic activities listed above    Pt educated throughout session about proper posture and technique with exercises. Improved exercise technique, movement at target joints, use of target muscles after min to mod verbal, visual, tactile cues.  Note: Portions of this document were prepared using Dragon voice recognition software and although reviewed may contain unintentional dictation errors in syntax, grammar, or spelling.                            PT Education - 03/15/21 1023     Education Details Importance of challenging balance strategies and improvement in balance and reduction in fall risk.  Person(s) Educated Patient    Methods Explanation;Demonstration    Comprehension Verbalized understanding;Returned demonstration;Verbal cues required;Tactile cues required              PT Short Term Goals - 03/12/21 1023       PT SHORT TERM GOAL #1   Title Patient will be adherent to HEP at least 3x a week to improve functional strength and balance for better safety at home.    Baseline Pt performing his HEP based on subjective reports (10/17/2019)    Time 4    Period Weeks    Status New    Target Date 02/26/21      PT SHORT TERM GOAL #2   Title Patient will improve BUE gross strength to 4/5 to improve functional strength for lifting/carrying groceries    Baseline 5/5 strength with all major planes of motion, no reports of arm pain, some tremor with max effort but no limit on funciton    Time 4    Period Weeks    Status Achieved               PT Long Term Goals - 03/12/21 1027       PT LONG TERM GOAL #1   Title Patient will increase BLE gross strength to 4+/5 as to improve functional strength for independent gait, increased standing tolerance and increased ADL ability.    Time 8    Period Weeks    Status Partially Met      PT LONG TERM GOAL #2   Title Patient will report a worst pain of 3/10 on VAS in low back  to improve tolerance  with ADLs and reduced symptoms with activities.    Baseline 4-5 at times when doing yardwork, pt reports it does not bother him unless he is doing something.    Time 8    Period Weeks    Status New      PT LONG TERM GOAL #3   Title Patient will tolerate standing/walking for at least 30 min with a maximum of 3/10 back pain to exhibit improved tolerance with ADLs such as cooking/cleaning, grocery shopping.    Baseline 4-5 pain when ambulating prolonged distances. 2-3/10 when standing for extended period of time    Time 8    Period Weeks    Status New      PT LONG TERM GOAL #4   Title Patient will increase 10 meter walk test to >1.19m/s as to improve gait speed for better community ambulation and to reduce fall risk.    Baseline 1.51m/s    Time 8    Period Weeks    Status New      PT LONG TERM GOAL #5   Title Patient will improve FOTO score by at least 5% to indicate improved functional mobility with ADLs.    Baseline 58.3 initial eval,    Time 8    Period Weeks    Status New      PT LONG TERM GOAL #6   Title Patient will increase six minute walk test distance to >1300 for progression to community ambulator and improve gait ability close to age group norms.    Baseline 1160 at PN 10/4    Time 8    Period Weeks    Status New                   Plan - 03/15/21 1024     Clinical Impression Statement Patient tolerated physical  therapy treatment session well as it demonstrated improved tolerance for therapeutic activities as well as neuromuscular reeducation exercises.  Patient had difficulty with activities on Bosu ball as well as with tandem walking activities on Airex balance beam.  Patient continues to excel with physical therapy exercises and continues to be motivated to improve throughout physical therapy sessions.  Patient requires contact-guard assist with high-level dynamic balance activities on Bosu ball and Airex balance beam.  Patient also has difficulty with  incorporating dual task, patient seems to think this may be due to his Parkinson's disease affecting his mental state minimally.  Patient will continue to benefit from physical therapy intervention in order to improve his balance, improve his strength, and improve his overall mobility.    Personal Factors and Comorbidities Comorbidity 3+;Age    Comorbidities Parkinson's disease, DM x2 (well controlled), chronic low back pain; s/p CABG x5, CKD III    Examination-Activity Limitations Locomotion Level;Squat;Stairs;Stand;Transfers    Examination-Participation Restrictions Community Activity;Shop;Volunteer;Yard Work    Stability/Clinical Decision Making Evolving/Moderate complexity    Rehab Potential Good    PT Frequency 2x / week    PT Duration 8 weeks    PT Treatment/Interventions Cryotherapy;Electrical Stimulation;Moist Heat;Gait training;Stair training;Functional mobility training;Therapeutic activities;Therapeutic exercise;Balance training;Neuromuscular re-education;Patient/family education;Manual techniques;Passive range of motion;Energy conservation    PT Next Visit Plan Continue strength training, review HEP, add PWR! moves as indicated, incorporate balance intervention as indicated    PT Home Exercise Plan Provided 02/01/21    Consulted and Agree with Plan of Care Patient             Patient will benefit from skilled therapeutic intervention in order to improve the following deficits and impairments:  Decreased balance, Decreased endurance, Decreased mobility, Difficulty walking, Decreased activity tolerance, Decreased strength, Postural dysfunction, Pain  Visit Diagnosis: Difficulty in walking, not elsewhere classified  Unsteadiness on feet  Muscle weakness (generalized)     Problem List Patient Active Problem List   Diagnosis Date Noted   Anemia, unspecified 02/07/2021   Neutropenia (HCC) 01/29/2021   Thrombocytopenia (HCC) 01/29/2021   Hyponatremia 12/22/2020   Bilateral  leg weakness 12/20/2020   Prostate cancer screening 09/08/2020   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) 09/06/2020   Low back pain of over 3 months duration 08/19/2019   Pulmonary hypertension (HCC)    Insomnia 12/21/2018   Sleep apnea in adult 12/09/2018   Periodic limb movement disorder 12/09/2018   Lung nodule 07/09/2018   Pulmonary nodules 05/18/2018   Wears hearing aid in both ears 05/17/2018   Leg pain, bilateral 02/20/2018   Dyspnea    Effort angina (HCC)    CKD (chronic kidney disease) stage 3, GFR 30-59 ml/min (HCC) 09/21/2017   History of skin cancer in adulthood 08/14/2016   Parkinson's disease (HCC) 08/09/2016   Bilateral carotid artery stenosis 04/02/2015   Vertigo, peripheral 10/17/2014   Benign prostatic hypertrophy with urinary frequency 01/31/2014   Encounter for Medicare annual wellness exam 07/02/2013   Obesity 04/03/2013   Other malaise and fatigue 09/21/2012   Hyperlipidemia    Hypertension    3-vessel coronary artery disease    S/P CABG x 5    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 [place], [place], [address] Phone: [phone]   Fax:  [phone]  Name: Johnathan Arnold. MRN: [id number] Date of Birth: 1945-08-02

## 2021-03-19 ENCOUNTER — Other Ambulatory Visit: Payer: Self-pay

## 2021-03-19 ENCOUNTER — Ambulatory Visit: Payer: Medicare Other | Admitting: Physical Therapy

## 2021-03-19 DIAGNOSIS — R262 Difficulty in walking, not elsewhere classified: Secondary | ICD-10-CM | POA: Diagnosis not present

## 2021-03-19 DIAGNOSIS — M6281 Muscle weakness (generalized): Secondary | ICD-10-CM

## 2021-03-19 DIAGNOSIS — R2681 Unsteadiness on feet: Secondary | ICD-10-CM | POA: Diagnosis not present

## 2021-03-19 DIAGNOSIS — M545 Low back pain, unspecified: Secondary | ICD-10-CM | POA: Diagnosis not present

## 2021-03-19 DIAGNOSIS — G8929 Other chronic pain: Secondary | ICD-10-CM | POA: Diagnosis not present

## 2021-03-19 NOTE — Therapy (Signed)
[place] MAIN [facility] SERVICES [address] [place], [place], [address] Phone: [phone]   Fax:  [phone]  Physical Therapy Treatment  Patient Details  Name: Johnathan Arnold. MRN: [id number] Date of Birth: [birth year] Referring Provider (PT): Dr. Carles Collet   Encounter Date: 03/19/2021   PT End of Session - 03/19/21 1019     Visit Number 12    Number of Visits 17    Date for PT Re-Evaluation 03/26/21    Authorization Type Medicare; start of care 01/29/21- 03/26/21    Progress Note Due on Visit 17    PT Start Time 1016    PT Stop Time 1058    PT Time Calculation (min) 42 min    Equipment Utilized During Treatment Gait belt    Activity Tolerance Patient tolerated treatment well    Behavior During Therapy WFL for tasks assessed/performed             Past Medical History:  Diagnosis Date   3-vessel coronary artery disease    s/p  5 vessel CABG   Diabetes mellitus without complication ([place])    History of cardiac catheterization 2011   [place]   Hyperlipidemia    Hypertension    Hypertriglyceridemia    Parkinson's disease ([place])    Pneumonia 12/28/2020   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 LIMA to LAD, SVG to D1, OM1 and RPDA   CORONARY ARTERY BYPASS GRAFT  03/1998   5 vessel, [facility]   RIGHT HEART CATH N/A 04/04/2019   Procedure: RIGHT HEART CATH;  Surgeon: Wellington Hampshire, MD;  Location: [place] CV LAB;  Service: Cardiovascular;  Laterality: N/A;   RIGHT/LEFT HEART CATH AND CORONARY ANGIOGRAPHY N/A 02/01/2018   Procedure: RIGHT/LEFT HEART CATH AND CORONARY ANGIOGRAPHY;  Surgeon: Wellington Hampshire, MD;  Location: [place] CV LAB;  Service: Cardiovascular;  Laterality: N/A;    There were no vitals filed for this visit.   Subjective Assessment - 03/19/21 1018     Subjective Pt reports no changes since  last session. Pt reports no soress following last session.             Therex Octane level 5 x 5 min for aerobic priming and aerobic conditioning   Neuro Re- Ed:   Step up and over BOSU ball.  X10 times with each lower extremity. -Cues for slow and steady movement in order to challenge balance strategies.   Step up to BOSU ball (soft side) and ball toss to mirror and catch, CGA.  -Pt reports as difficult 10 x ant step up/down (5 x ea LE) -2x10x lateral step up and down (10 on each side followed by brief rest period)  -In order to improve balance reactive strategies when sidestepping as well as to incorporate dual motor task having to manage ball catch and throw and balance simultaneously.   Airex balance beam walking - 2 x 1 minute -difficulty with balance beam, 1 lob corrected with stepping strategy, difficulty maintinag balance with tandem stance on the unsteady surface indicating difficulty with impaired proprioception.  -Patient rated as very difficult -dual task cognitive of naming capital cities in [place]  -Patient reports all dual task activities as increase in difficulty.  Rocker board oriented to rock laterally 2 x 30 sec with lateral head turns  1 x 1 min  with lateral head turns -pt rated as difficult, multiple occassions where pt required utilization of UE for maintenance of balance -{t re[prts ead turns make task increasingly difficult   Unless otherwise stated, CGA was provided and gait belt donned in order to ensure pt safety with the above activities         There Act: TRX Squat with Jump: to improve muscular power.  -2 x 12 -good efficacy with task, min cues required this session.  -Some fatigue noted at end of task as evidenced by increased respiration rate   TRX lunge: for improvement with bending and kneeling tasks as well as incorporating UE stretch to musculature that decraeases inflexibility with PD.  -2 x 10 on ea LE    Supervision level assist with  therapeutic activities listed above       Pt educated throughout session about proper posture and technique with exercises. Improved exercise technique, movement at target joints, use of target muscles after min to mod verbal, visual, tactile cues.   Note: Portions of this document were prepared using Dragon voice recognition software and although reviewed may contain unintentional dictation errors in syntax, grammar, or spelling.                          PT Education - 03/19/21 1019     Education Details Exs form and technique    Person(s) Educated Patient    Methods Explanation;Demonstration    Comprehension Verbalized understanding;Returned demonstration;Verbal cues required;Tactile cues required              PT Short Term Goals - 03/12/21 1023       PT SHORT TERM GOAL #1   Title Patient will be adherent to HEP at least 3x a week to improve functional strength and balance for better safety at home.    Baseline Pt performing his HEP based on subjective reports (10/17/2019)    Time 4    Period Weeks    Status New    Target Date 02/26/21      PT SHORT TERM GOAL #2   Title Patient will improve BUE gross strength to 4/5 to improve functional strength for lifting/carrying groceries    Baseline 5/5 strength with all major planes of motion, no reports of arm pain, some tremor with max effort but no limit on funciton    Time 4    Period Weeks    Status Achieved               PT Long Term Goals - 03/12/21 1027       PT LONG TERM GOAL #1   Title Patient will increase BLE gross strength to 4+/5 as to improve functional strength for independent gait, increased standing tolerance and increased ADL ability.    Time 8    Period Weeks    Status Partially Met      PT LONG TERM GOAL #2   Title Patient will report a worst pain of 3/10 on VAS in low back  to improve tolerance with ADLs and reduced symptoms with activities.    Baseline 4-5 at times when  doing yardwork, pt reports it does not bother him unless he is doing something.    Time 8    Period Weeks    Status New      PT LONG TERM GOAL #3   Title Patient will tolerate standing/walking for at least 30 min with a maximum of 3/10 back  pain to exhibit improved tolerance with ADLs such as cooking/cleaning, grocery shopping.    Baseline 4-5 pain when ambulating prolonged distances. 2-3/10 when standing for extended period of time    Time 8    Period Weeks    Status New      PT LONG TERM GOAL #4   Title Patient will increase 10 meter walk test to >1.34ms as to improve gait speed for better community ambulation and to reduce fall risk.    Baseline 1.039m    Time 8    Period Weeks    Status New      PT LONG TERM GOAL #5   Title Patient will improve FOTO score by at least 5% to indicate improved functional mobility with ADLs.    Baseline 58.3 initial eval,    Time 8    Period Weeks    Status New      PT LONG TERM GOAL #6   Title Patient will increase six minute walk test distance to >1300 for progression to community ambulator and improve gait ability close to age group norms.    Baseline 1160 at PN 10/4    Time 8    Period Weeks    Status New                   Plan - 03/19/21 1020     Clinical Impression Statement Plan    Personal Factors and Comorbidities Comorbidity 3+;Age    Comorbidities Parkinson's disease, DM x2 (well controlled), chronic low back pain; s/p CABG x5, CKD III    Examination-Activity Limitations Locomotion Level;Squat;Stairs;Stand;Transfers    Examination-Participation Restrictions Community Activity;Shop;Volunteer;Yard Work    Stability/Clinical Decision Making Evolving/Moderate complexity    Rehab Potential Good    PT Frequency 2x / week    PT Duration 8 weeks    PT Treatment/Interventions Cryotherapy;Electrical Stimulation;Moist Heat;Gait training;Stair training;Functional mobility training;Therapeutic activities;Therapeutic  exercise;Balance training;Neuromuscular re-education;Patient/family education;Manual techniques;Passive range of motion;Energy conservation    PT Next Visit Plan Continue strength training, review HEP, add PWR! moves as indicated, incorporate balance intervention as indicated    PT Home Exercise Plan Provided 02/01/21    Consulted and Agree with Plan of Care Patient             Patient will benefit from skilled therapeutic intervention in order to improve the following deficits and impairments:  Decreased balance, Decreased endurance, Decreased mobility, Difficulty walking, Decreased activity tolerance, Decreased strength, Postural dysfunction, Pain  Visit Diagnosis: Difficulty in walking, not elsewhere classified  Unsteadiness on feet  Muscle weakness (generalized)     Problem List Patient Active Problem List   Diagnosis Date Noted   Anemia, unspecified 02/07/2021   Neutropenia ([place]08/23/2022   Thrombocytopenia ([facility]08/23/2022   Hyponatremia 12/22/2020   Bilateral leg weakness 12/20/2020   Prostate cancer screening 09/08/2020   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[facility]03/31/2022   Low back pain of over 3 months duration 08/19/2019   Pulmonary hypertension ([facility] [place]   Insomnia 12/21/2018   Sleep apnea in adult 12/09/2018   Periodic limb movement disorder 12/09/2018   Lung nodule 07/09/2018   Pulmonary nodules 05/18/2018   Wears hearing aid in both ears 05/17/2018   Leg pain, bilateral 02/20/2018   Dyspnea    Effort angina (HCC)    CKD (chronic kidney disease) stage 3, GFR 30-59 ml/min ([facility]04/15/2019   History of skin cancer in adulthood 08/14/2016   Parkinson's disease ([facility]03/08/2016   Bilateral carotid  artery stenosis 04/02/2015   Vertigo, peripheral 10/17/2014   Benign prostatic hypertrophy with urinary frequency 01/31/2014   Encounter for Medicare annual wellness exam 07/02/2013   Obesity 04/03/2013   Other malaise and fatigue 09/21/2012    Hyperlipidemia    Hypertension    3-vessel coronary artery disease    S/P CABG x 5    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 MRN: [id number] Date of Birth: 1945-11-22

## 2021-03-22 ENCOUNTER — Encounter: Payer: Self-pay | Admitting: Physical Therapy

## 2021-03-22 ENCOUNTER — Other Ambulatory Visit: Payer: Self-pay

## 2021-03-22 ENCOUNTER — Ambulatory Visit: Payer: Medicare Other

## 2021-03-22 ENCOUNTER — Other Ambulatory Visit: Payer: Self-pay | Admitting: Cardiovascular Disease

## 2021-03-22 DIAGNOSIS — R2681 Unsteadiness on feet: Secondary | ICD-10-CM

## 2021-03-22 DIAGNOSIS — G8929 Other chronic pain: Secondary | ICD-10-CM | POA: Diagnosis not present

## 2021-03-22 DIAGNOSIS — M6281 Muscle weakness (generalized): Secondary | ICD-10-CM

## 2021-03-22 DIAGNOSIS — R262 Difficulty in walking, not elsewhere classified: Secondary | ICD-10-CM | POA: Diagnosis not present

## 2021-03-22 DIAGNOSIS — M545 Low back pain, unspecified: Secondary | ICD-10-CM | POA: Diagnosis not present

## 2021-03-22 NOTE — Therapy (Signed)
[place] MAIN [facility] SERVICES [address] [place], [place], [address] Phone: [phone]   Fax:  [phone]  Physical Therapy Treatment  Patient Details  Name: Johnathan Arnold. MRN: [id number] Date of Birth: 09-16-1945 Referring Provider (PT): Dr. Carles Collet   Encounter Date: 03/22/2021   PT End of Session - 03/22/21 0939     Visit Number 13    Number of Visits 17    Date for PT Re-Evaluation 03/26/21    Authorization Type Medicare; start of care 01/29/21- 03/26/21    Progress Note Due on Visit 16    PT Start Time 0932    PT Stop Time 1012    PT Time Calculation (min) 40 min    Equipment Utilized During Treatment Gait belt    Activity Tolerance Patient tolerated treatment well    Behavior During Therapy [facility] for tasks assessed/performed             Past Medical History:  Diagnosis Date   3-vessel coronary artery disease    s/p  5 vessel CABG   Diabetes mellitus without complication ([place])    History of cardiac catheterization 2011   [facility]   Hyperlipidemia    Hypertension    Hypertriglyceridemia    Parkinson's disease ([place])    Pneumonia 12/28/2020   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 LIMA to LAD, SVG to D1, OM1 and RPDA   CORONARY ARTERY BYPASS GRAFT  03/1998   5 vessel, [facility]   RIGHT HEART CATH N/A 04/04/2019   Procedure: RIGHT HEART CATH;  Surgeon: Wellington Hampshire, MD;  Location: [place] [facility];  Service: Cardiovascular;  Laterality: N/A;   RIGHT/LEFT HEART CATH AND CORONARY ANGIOGRAPHY N/A 02/01/2018   Procedure: RIGHT/LEFT HEART CATH AND CORONARY ANGIOGRAPHY;  Surgeon: Wellington Hampshire, MD;  Location: [place] [facility];  Service: Cardiovascular;  Laterality: N/A;    There were no vitals filed for this visit.   Subjective Assessment - 03/22/21 0937     Subjective Pt reporting "off day"with  his Parkinson's requesting to focus on LE strength. No medication changes or falls reported.    Currently in Pain? No/denies             Therex  Octane level 5 x 5 min for aerobic priming and aerobic conditioning  STS from standard height chair:   1x12 with 3000 gram ball  2x15 with 5000 gram ball   TRX alternating lunges:   2x12/LE, CGA due to minor sway with balance initially then progressed to SBA  6" side step ups: 2x12/LE for lateral hip strength/stability. No UE support needed  Alternating heel to toe raises: 2x12 with UE support as needed on balance bar. 2x12 with 3# AW's  Alternating marches with 5# AW's: 1x12/LE   Alternaing hamstring curls with 5# AW's: 1x12/LE      Seated/standing rest breaks intermittently throughout session.    PT Education - 03/22/21 9892     Education Details form/technique with exercise.    Person(s) Educated Patient    Methods Explanation;Demonstration    Comprehension Verbalized understanding;Returned demonstration              PT Short Term Goals - 03/12/21 1023       PT SHORT TERM GOAL #1   Title Patient will be adherent to HEP at least  3x a week to improve functional strength and balance for better safety at home.    Baseline Pt performing his HEP based on subjective reports (10/17/2019)    Time 4    Period Weeks    Status New    Target Date 02/26/21      PT SHORT TERM GOAL #2   Title Patient will improve BUE gross strength to 4/5 to improve functional strength for lifting/carrying groceries    Baseline 5/5 strength with all major planes of motion, no reports of arm pain, some tremor with max effort but no limit on funciton    Time 4    Period Weeks    Status Achieved               PT Long Term Goals - 03/12/21 1027       PT LONG TERM GOAL #1   Title Patient will increase BLE gross strength to 4+/5 as to improve functional strength for independent gait, increased standing tolerance and increased ADL  ability.    Time 8    Period Weeks    Status Partially Met      PT LONG TERM GOAL #2   Title Patient will report a worst pain of 3/10 on VAS in low back  to improve tolerance with ADLs and reduced symptoms with activities.    Baseline 4-5 at times when doing yardwork, pt reports it does not bother him unless he is doing something.    Time 8    Period Weeks    Status New      PT LONG TERM GOAL #3   Title Patient will tolerate standing/walking for at least 30 min with a maximum of 3/10 back pain to exhibit improved tolerance with ADLs such as cooking/cleaning, grocery shopping.    Baseline 4-5 pain when ambulating prolonged distances. 2-3/10 when standing for extended period of time    Time 8    Period Weeks    Status New      PT LONG TERM GOAL #4   Title Patient will increase 10 meter walk test to >1.73m/s as to improve gait speed for better community ambulation and to reduce fall risk.    Baseline 1.44m/s    Time 8    Period Weeks    Status New      PT LONG TERM GOAL #5   Title Patient will improve FOTO score by at least 5% to indicate improved functional mobility with ADLs.    Baseline 58.3 initial eval,    Time 8    Period Weeks    Status New      PT LONG TERM GOAL #6   Title Patient will increase six minute walk test distance to >1300 for progression to community ambulator and improve gait ability close to age group norms.    Baseline 1160 at PN 10/4    Time 8    Period Weeks    Status New                   Plan - 03/22/21 1131     Clinical Impression Statement Covering PT following primary PT POC as prescribed. Due to pt reporting "off day" with PD, requesting to focus on LE strengthening. Pt tolerated session well with ability to safely complete all LE therex without LOB or difficulty. Pt displaying good motivation and form/technique with all exercises. No overt LOB througout session and intermittent rest breaks given to pt as needed. Pt will continue  to  benefit from skilled PT services to improve balance and strength to optimize functional mobility with ADL completion.    Personal Factors and Comorbidities Comorbidity 3+;Age    Comorbidities Parkinson's disease, DM x2 (well controlled), chronic low back pain; s/p CABG x5, CKD III    Examination-Activity Limitations Locomotion Level;Squat;Stairs;Stand;Transfers    Examination-Participation Restrictions Community Activity;Shop;Volunteer;Yard Work    [employer] Evolving/Moderate complexity    Clinical Decision Making Moderate    Rehab Potential Good    PT Frequency 2x / week    PT Duration 8 weeks    PT Treatment/Interventions Cryotherapy;Electrical Stimulation;Moist Heat;Gait training;Stair training;Functional mobility training;Therapeutic activities;Therapeutic exercise;Balance training;Neuromuscular re-education;Patient/family education;Manual techniques;Passive range of motion;Energy conservation    PT Next Visit Plan Continue strength training, review HEP, add PWR! moves as indicated, incorporate balance intervention as indicated    PT Home Exercise Plan Provided 02/01/21    Consulted and Agree with Plan of Care Patient             Patient will benefit from skilled therapeutic intervention in order to improve the following deficits and impairments:  Decreased balance, Decreased endurance, Decreased mobility, Difficulty walking, Decreased activity tolerance, Decreased strength, Postural dysfunction, Pain  Visit Diagnosis: Difficulty in walking, not elsewhere classified  Unsteadiness on feet  Muscle weakness (generalized)     Problem List Patient Active Problem List   Diagnosis Date Noted   Anemia, unspecified 02/07/2021   Neutropenia ([place]) 01/29/2021   Thrombocytopenia ([place]) 01/29/2021   Hyponatremia 12/22/2020   Bilateral leg weakness 12/20/2020   Prostate cancer screening 09/08/2020   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[place])  09/06/2020   Low back pain of over 3 months duration 08/19/2019   Pulmonary hypertension ([place])    Insomnia 12/21/2018   Sleep apnea in adult 12/09/2018   Periodic limb movement disorder 12/09/2018   Lung nodule 07/09/2018   Pulmonary nodules 05/18/2018   Wears hearing aid in both ears 05/17/2018   Leg pain, bilateral 02/20/2018   Dyspnea    Effort angina ([facility])    CKD (chronic kidney disease) stage 3, GFR 30-59 ml/min ([place]) 09/21/2017   History of skin cancer in adulthood 08/14/2016   Parkinson's disease ([place]) 08/09/2016   Bilateral carotid artery stenosis 04/02/2015   Vertigo, peripheral 10/17/2014   Benign prostatic hypertrophy with urinary frequency 01/31/2014   Encounter for Medicare annual wellness exam 07/02/2013   Obesity 04/03/2013   Other malaise and fatigue 09/21/2012   Hyperlipidemia    Hypertension    3-vessel coronary artery disease    S/P CABG x 5    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 Fairly IV, PT, DPT Physical Therapist- [facility]  03/22/2021, 11:35 AM  [place] MAIN [facility] SERVICES [address] [place], [place], [address] Phone: [phone]   Fax:  [phone]  Name: Jayko Voorhees. MRN: [id number] Date of Birth: [birth year]

## 2021-03-26 ENCOUNTER — Ambulatory Visit: Payer: Medicare Other | Admitting: Physical Therapy

## 2021-03-26 ENCOUNTER — Other Ambulatory Visit: Payer: Self-pay

## 2021-03-26 ENCOUNTER — Encounter: Payer: Self-pay | Admitting: Physical Therapy

## 2021-03-26 DIAGNOSIS — R262 Difficulty in walking, not elsewhere classified: Secondary | ICD-10-CM | POA: Diagnosis not present

## 2021-03-26 DIAGNOSIS — M545 Low back pain, unspecified: Secondary | ICD-10-CM

## 2021-03-26 DIAGNOSIS — R2681 Unsteadiness on feet: Secondary | ICD-10-CM | POA: Diagnosis not present

## 2021-03-26 DIAGNOSIS — M6281 Muscle weakness (generalized): Secondary | ICD-10-CM

## 2021-03-26 DIAGNOSIS — G8929 Other chronic pain: Secondary | ICD-10-CM

## 2021-03-26 NOTE — Therapy (Signed)
[place] MAIN [facility] SERVICES [address] [place], [place], [address] Phone: [phone]   Fax:  [phone]  Physical Therapy Treatment/ Physical Therapy recertification note   Dates of reporting period  01/29/21   to   03/26/21   Patient Details  Name: Johnathan Arnold. MRN: [id number] Date of Birth: November 29, 1945 Referring Provider (PT): Dr. Carles Collet   Encounter Date: 03/26/2021   PT End of Session - 03/26/21 1031     Visit Number 14    Number of Visits 24    Date for PT Re-Evaluation 05/21/21    Authorization Type Medicare; start of care 01/18/56- 26/20/35 New cert 59/74/16-38/45/36    Progress Note Due on Visit 62    PT Start Time 1022    PT Stop Time 1100    PT Time Calculation (min) 38 min    Equipment Utilized During Treatment Gait belt    Activity Tolerance Patient tolerated treatment well    Behavior During Therapy WFL for tasks assessed/performed             Past Medical History:  Diagnosis Date   3-vessel coronary artery disease    s/p  5 vessel CABG   Diabetes mellitus without complication ([place])    History of cardiac catheterization 2011   [place]   Hyperlipidemia    Hypertension    Hypertriglyceridemia    Parkinson's disease ([place])    Pneumonia 12/28/2020   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 LIMA to LAD, SVG to D1, OM1 and RPDA   CORONARY ARTERY BYPASS GRAFT  03/1998   5 vessel, [facility]   RIGHT HEART CATH N/A 04/04/2019   Procedure: RIGHT HEART CATH;  Surgeon: Wellington Hampshire, MD;  Location: [place] [facility];  Service: Cardiovascular;  Laterality: N/A;   RIGHT/LEFT HEART CATH AND CORONARY ANGIOGRAPHY N/A 02/01/2018   Procedure: RIGHT/LEFT HEART CATH AND CORONARY ANGIOGRAPHY;  Surgeon: Wellington Hampshire, MD;  Location: [place] [facility];  Service: Cardiovascular;  Laterality: N/A;    There  were no vitals filed for this visit.   Subjective Assessment - 03/26/21 1028     Subjective Pt reports feeling better today compared to previous session where he was having "off day" with PD. No other changes or LOB since last session.    Pertinent History 75 yo Male presents to therapy with history of Parkinson's Disease. He reports in June 2022 he got out of bed one morning and his legs just quit working. He was unable to get up and move around. He reports progressive weakness and not feeling well for about 4-5 weeks. He reports his legs feel weak and he is unable to walk as far. He reports soreness in calf. Patient presents to therapy without AD. he reports he used a cane for a few weeks but doesn't use it anymore. He reports falling a few weeks ago while [place] the beach. He reports he lost his balance in the sand. He reports 6 weeks ago he was on the portch swing and when he stood up he tried to get his balance and must have passed out. He denies any significant injuries with each fall. He did see his neurologist last week who increased his carbiopa/levadopa. He reports he has been doing okay with increased medication; He reports having pneumonia but wasn't aware and neurologist  believes that exacerbated his Parkinson's Disease. He is doing [employer] which he does 3 days a week; He does have a resting tremor in RUE;  He also reports difficulty writing; He reports difficulty buttoning a button but other dressing is not difficult. In addition he has a history of chronic low back pain which he states has been increasing in center of low back. He reports his back pain worse with standing and activity and alleviates with sitting;    Limitations Standing;Walking    How long can you sit comfortably? no difficulty; sitting alleviates pain;    How long can you stand comfortably? 10-15 min    How long can you walk comfortably? 5-10 min, worse with carrying something and with walking;    Diagnostic tests  MRI in June 2022 Multilevel degenerative changes without stenosis;    Patient Stated Goals "Improve back pain, increase strength in legs and arms"    Currently in Pain? No/denies            Therex:   Octane level 6 for 5 minutes for aerobic priming.  Assistance with adjusting seat height to proper elevation and cues for utilizing upper extremities.  Neuro Re- Ed:    Step up and over BOSU ball.  X10 times with each lower extremity. -Cues for slow and steady movement in order to challenge balance strategies.   Step up to BOSU ball (soft side) and ball toss to mirror and catch, CGA.  -Pt reports as difficult 10 x ant step up/down (5 x ea LE) -2x10x lateral step up and down (10 on each side followed by brief rest period)  -In order to improve balance reactive strategies when sidestepping as well as to incorporate dual motor task having to manage ball catch and throw and balance simultaneously.   The following activities were completed in parallel bars or at balance bar   Airex balance beam walking to BOSU ball and 180 degree turn on BOSU 2 x 3 laps through  - 2 x 1 minute -difficulty with balance beam, 1 lob corrected with stepping strategy, difficulty maintinag balance with tandem stance on the unsteady surface indicating difficulty with impaired proprioception.  -Patient rated as very difficult -intermitted use of UE with slight LOB    Unless otherwise stated, CGA was provided and gait belt donned in order to ensure pt safety with the above activities         There Act: TRX Squat with Jump: to improve muscular power.  -2 x 12 -good efficacy with task, min cues required this session.  -Some fatigue noted at end of task as evidenced by increased respiration rate   TRX lunge: for improvement with bending and kneeling tasks as well as incorporating UE stretch to musculature that decraeases inflexibility with PD.  -2 x 10 on ea LE    Supervision level assist with therapeutic  activities listed above       Pt educated throughout session about proper posture and technique with exercises. Improved exercise technique, movement at target joints, use of target muscles after min to mod verbal, visual, tactile cues.   Note: Portions of this document were prepared using Dragon voice recognition software and although reviewed may contain unintentional dictation errors in syntax, grammar, or spelling.                             PT Education - 03/26/21 1030     Education  Details exs form and technique    Person(s) Educated Patient    Methods Explanation    Comprehension Verbalized understanding;Returned demonstration;Verbal cues required              PT Short Term Goals - 03/12/21 1023       PT SHORT TERM GOAL #1   Title Patient will be adherent to HEP at least 3x a week to improve functional strength and balance for better safety at home.    Baseline Pt performing his HEP based on subjective reports (10/17/2019)    Time 4    Period Weeks    Status New    Target Date 02/26/21      PT SHORT TERM GOAL #2   Title Patient will improve BUE gross strength to 4/5 to improve functional strength for lifting/carrying groceries    Baseline 5/5 strength with all major planes of motion, no reports of arm pain, some tremor with max effort but no limit on funciton    Time 4    Period Weeks    Status Achieved               PT Long Term Goals - 03/26/21 1058       PT LONG TERM GOAL #1   Title Patient will increase BLE gross strength to 4+/5 as to improve functional strength for independent gait, increased standing tolerance and increased ADL ability.    Time 8    Period Weeks    Status Achieved    Target Date 03/26/21      PT LONG TERM GOAL #2   Title Patient will report a worst pain of 3/10 on VAS in low back  to improve tolerance with ADLs and reduced symptoms with activities.    Baseline Report no significant pain with yard work,  wrist reports normal pain he has had chronically for many years but does not limit him at this time    Time 8    Period Weeks    Status Achieved      PT LONG TERM GOAL #3   Title Patient will tolerate standing/walking for at least 30 min with a maximum of 3/10 back pain to exhibit improved tolerance with ADLs such as cooking/cleaning, grocery shopping.    Baseline Patient does not have reports of back pain that is limiting him from ambulating at this time.    Time 8    Period Weeks    Status Partially Met      PT LONG TERM GOAL #4   Title Patient will improve mini best test by 3 points or more indicating decreased risk of falls.    Baseline Mini best score 24 at progress note on 10/4    Time 8    Period Weeks    Status Achieved    Target Date 05/21/21      PT LONG TERM GOAL #5   Title Patient will improve FOTO score by at least 5% to indicate improved functional mobility with ADLs.    Baseline 58.3 initial eval, FOTO 66    Time 8    Period Weeks    Status Achieved      PT LONG TERM GOAL #6   Title Patient will increase six minute walk test distance to >1300 for progression to community ambulator and improve gait ability close to age group norms.    Baseline 1160 at PN 10/4    Time 8    Period Weeks    Status On-going  Target Date 05/21/21                   Plan - 03/26/21 1247     Clinical Impression Statement Patient presents to physical therapy session for recertification note.  Patient has made progress with several goals and his lower extremity strength continues to improve.  Since his progress note we have began to work on several high-level balance activities although patient does not report any significant improvement in his day-to-day function and has noted improvement with high-level balance activities completed in physical therapy sessions.  Patient has made great progress toward all goals throughout physical therapy session all of these goals have been  assessed and reassessed within the last 2 weeks or during today's session.  Patient will continue to benefit from skilled physical therapy services in order to improve his lower extremity strength, his balance, improve his cough and his ability to complete tasks around his home in the community. Patient's condition has the potential to improve in response to therapy. Maximum improvement is yet to be obtained. The anticipated improvement is attainable and reasonable in a generally predictable time.      Personal Factors and Comorbidities Comorbidity 3+;Age    Comorbidities Parkinson's disease, DM x2 (well controlled), chronic low back pain; s/p CABG x5, CKD III    Examination-Activity Limitations Locomotion Level;Squat;Stairs;Stand;Transfers    Examination-Participation Restrictions Community Activity;Shop;Volunteer;Yard Work    Stability/Clinical Decision Making Evolving/Moderate complexity    Rehab Potential Good    PT Frequency 2x / week    PT Duration 6 weeks    PT Treatment/Interventions Cryotherapy;Electrical Stimulation;Moist Heat;Gait training;Stair training;Functional mobility training;Therapeutic activities;Therapeutic exercise;Balance training;Neuromuscular re-education;Patient/family education;Manual techniques;Passive range of motion;Energy conservation    PT Next Visit Plan high level baalbnce, LE muscular strength and power    PT Home Exercise Plan Provided 02/01/21    Consulted and Agree with Plan of Care Patient             Patient will benefit from skilled therapeutic intervention in order to improve the following deficits and impairments:  Decreased balance, Decreased endurance, Decreased mobility, Difficulty walking, Decreased activity tolerance, Decreased strength, Postural dysfunction, Pain  Visit Diagnosis: Difficulty in walking, not elsewhere classified  Unsteadiness on feet  Muscle weakness (generalized)  Chronic low back pain, unspecified back pain laterality,  unspecified whether sciatica present     Problem List Patient Active Problem List   Diagnosis Date Noted   Anemia, unspecified 02/07/2021   Neutropenia ([place]) 01/29/2021   Thrombocytopenia ([place]) 01/29/2021   Hyponatremia 12/22/2020   Bilateral leg weakness 12/20/2020   Prostate cancer screening 09/08/2020   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[place]) 09/06/2020   Low back pain of over 3 months duration 08/19/2019   Pulmonary hypertension ([place])    Insomnia 12/21/2018   Sleep apnea in adult 12/09/2018   Periodic limb movement disorder 12/09/2018   Lung nodule 07/09/2018   Pulmonary nodules 05/18/2018   Wears hearing aid in both ears 05/17/2018   Leg pain, bilateral 02/20/2018   Dyspnea    Effort angina ([facility])    CKD (chronic kidney disease) stage 3, GFR 30-59 ml/min ([place]) 09/21/2017   History of skin cancer in adulthood 08/14/2016   Parkinson's disease ([place]) 08/09/2016   Bilateral carotid artery stenosis 04/02/2015   Vertigo, peripheral 10/17/2014   Benign prostatic hypertrophy with urinary frequency 01/31/2014   Encounter for Medicare annual wellness exam 07/02/2013   Obesity 04/03/2013   Other malaise and fatigue 09/21/2012  Hyperlipidemia    Hypertension    3-vessel coronary artery disease    S/P CABG x 5    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 [place], [place], [address] Phone: [phone]   Fax:  [phone]  Name: Johnathan Arnold. MRN: [id number] Date of Birth: November 29, 1945

## 2021-03-27 ENCOUNTER — Other Ambulatory Visit: Payer: Self-pay

## 2021-03-27 ENCOUNTER — Telehealth: Payer: Self-pay

## 2021-03-27 DIAGNOSIS — G20 Parkinson's disease: Secondary | ICD-10-CM

## 2021-03-27 MED ORDER — CLONAZEPAM 0.5 MG PO TABS: ORAL_TABLET | ORAL | 4 refills | Status: DC

## 2021-03-27 NOTE — Telephone Encounter (Signed)
Medication has been sent to Dr. Tat for approval  

## 2021-03-29 ENCOUNTER — Other Ambulatory Visit: Payer: Self-pay

## 2021-03-29 ENCOUNTER — Ambulatory Visit: Payer: Medicare Other | Admitting: Physical Therapy

## 2021-03-29 DIAGNOSIS — M6281 Muscle weakness (generalized): Secondary | ICD-10-CM

## 2021-03-29 DIAGNOSIS — R2681 Unsteadiness on feet: Secondary | ICD-10-CM | POA: Diagnosis not present

## 2021-03-29 DIAGNOSIS — G8929 Other chronic pain: Secondary | ICD-10-CM | POA: Diagnosis not present

## 2021-03-29 DIAGNOSIS — R262 Difficulty in walking, not elsewhere classified: Secondary | ICD-10-CM

## 2021-03-29 DIAGNOSIS — M545 Low back pain, unspecified: Secondary | ICD-10-CM | POA: Diagnosis not present

## 2021-03-29 NOTE — Therapy (Signed)
[place] MAIN [facility] SERVICES [address] [place], [place], [address] Phone: [phone]   Fax:  [phone]  Physical Therapy Treatment  Patient Details  Name: Johnathan Arnold. MRN: [id number] Date of Birth: [birth year] Referring Provider (PT): Dr. Carles Collet   Encounter Date: 03/29/2021   PT End of Session - 03/29/21 1019     Visit Number 15    Number of Visits 24    Date for PT Re-Evaluation 05/21/21    Authorization Type Medicare; start of care 2/35/57- 32/20/25 New cert 42/70/62-37/62/83    Progress Note Due on Visit 40    PT Start Time 1015    PT Stop Time 1100    PT Time Calculation (min) 45 min    Equipment Utilized During Treatment Gait belt    Activity Tolerance Patient tolerated treatment well    Behavior During Therapy [facility] for tasks assessed/performed             Past Medical History:  Diagnosis Date   3-vessel coronary artery disease    s/p  5 vessel CABG   Diabetes mellitus without complication ([place])    History of cardiac catheterization 2011   [place]   Hyperlipidemia    Hypertension    Hypertriglyceridemia    Parkinson's disease ([place])    Pneumonia 12/28/2020   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 LIMA to LAD, SVG to D1, OM1 and RPDA   CORONARY ARTERY BYPASS GRAFT  03/1998   5 vessel, [facility]   RIGHT HEART CATH N/A 04/04/2019   Procedure: RIGHT HEART CATH;  Surgeon: Wellington Hampshire, MD;  Location: [place] [facility];  Service: Cardiovascular;  Laterality: N/A;   RIGHT/LEFT HEART CATH AND CORONARY ANGIOGRAPHY N/A 02/01/2018   Procedure: RIGHT/LEFT HEART CATH AND CORONARY ANGIOGRAPHY;  Surgeon: Wellington Hampshire, MD;  Location: [place] [facility];  Service: Cardiovascular;  Laterality: N/A;    There were no vitals filed for this visit.   Subjective Assessment - 03/29/21 1018     Subjective  Pt rpeorts no significant changes since previous session. Reports min groin pain that has been present for a couple months when lifting LE obe 90 degrees hip flexion.    Patient is accompained by: Family member    Pertinent History 75 yo Male presents to therapy with history of Parkinson's Disease. He reports in June 2022 he got out of bed one morning and his legs just quit working. He was unable to get up and move around. He reports progressive weakness and not feeling well for about 4-5 weeks. He reports his legs feel weak and he is unable to walk as far. He reports soreness in calf. Patient presents to therapy without AD. he reports he used a cane for a few weeks but doesn't use it anymore. He reports falling a few weeks ago while at the beach. He reports he lost his balance in the sand. He reports 6 weeks ago he was on the portch swing and when he stood up he tried to get his balance and must have passed out. He denies any significant injuries with each fall. He did see his neurologist last week who increased his carbiopa/levadopa. He reports he has been doing okay with increased medication; He reports having pneumonia but wasn't aware and neurologist believes that exacerbated his Parkinson's Disease. He  is doing [employer] which he does 3 days a week; He does have a resting tremor in RUE;  He also reports difficulty writing; He reports difficulty buttoning a button but other dressing is not difficult. In addition he has a history of chronic low back pain which he states has been increasing in center of low back. He reports his back pain worse with standing and activity and alleviates with sitting;    Limitations Standing;Walking    How long can you sit comfortably? no difficulty; sitting alleviates pain;    How long can you stand comfortably? 10-15 min    How long can you walk comfortably? 5-10 min, worse with carrying something and with walking;    Diagnostic tests MRI in June 2022 Multilevel  degenerative changes without stenosis;    Patient Stated Goals "Improve back pain, increase strength in legs and arms"              Treatment provided this session  Therex:   [place]ane level 6 x 5 min for aerobic priming and aerobic conditioning    Standing abductor stretch on the right side 2 times holding for 30 seconds each with cues for proper form and technique as well as for hold times.  Patient provided with home exercise program displaying this exercise.     Neuro Re- Ed:   Step up to Bosu 5 x ea LE to soft side of airex with ball toss and catch towards mirror anteriorly -Multiple losses of balance posteriorly patient able to correct via stepping strategy with contact-guard assist and sometimes min assist from author.  Sidestep to Bosu x5 each lower extremity on each side to soft side of Airex with ball toss and catch toward mirror which was located anteriorly -If you losses of balance with able to recover via stepping strategy off of ball.  Patient demonstrates improved stepping strategies with loss of balance on Bosu  Balance course consisting of Airex balance beam, green Thera-Band pad, and 3 hedgehog's stepping from hedgehog to hedgehog to Thera-Band pad.  Multiple loss of balance and significant difficulty patient able to recover all with stepping strategy and with occasional contact-guard assist from author.  Patient reported as high difficulty.  Unless otherwise stated, CGA was provided and gait belt donned in order to ensure pt safety   There Act: TRX squat with jump 2 x 12 to ipmrove LE strength, power and ability to perform high level transfers such as siting on stools or ground  TRX lunge with hor abduciton or UE   2 x 12 (to ea side) to ipmrove LE strength, power and ability to perform high level transfers such as kneeling   TRX side squat x10 to each side.  Patient reports no pain in abductor musculature with this exercise.  To increase weight shifting as  well as lower extremity strength and ability to access objects at lower levels.      Manual Therapy:  Pt assessed for R sided groin pain, pt had min tightness in IR and Abduction, stertch with contract /relax MET provided for 5 min each with 1 min holds following contract relax -Patient did not report improvement in pain and was increased hip flexion following, patient continues to localize pain to groin and proximal abductor musculature.    Note: Portions of this document were prepared using Dragon voice recognition software and although reviewed may contain unintentional dictation errors in syntax, grammar, or spelling.   Pt educated throughout session about proper posture  and technique with exercises. Improved exercise technique, movement at target joints, use of target muscles after min to mod verbal, visual, tactile cues.                           PT Education - 03/29/21 1019     Education Details Educated regarding right abductor stretch and how to complete at home, provided home exercise program for this and patient structured to complete over the next week.  Patient instructed not to continue if any increase in abductor/groin pain occurs    Person(s) Educated Patient    Methods Explanation    Comprehension Verbalized understanding;Returned demonstration;Verbal cues required              PT Short Term Goals - 03/12/21 1023       PT SHORT TERM GOAL #1   Title Patient will be adherent to HEP at least 3x a week to improve functional strength and balance for better safety at home.    Baseline Pt performing his HEP based on subjective reports (10/17/2019)    Time 4    Period Weeks    Status New    Target Date 02/26/21      PT SHORT TERM GOAL #2   Title Patient will improve BUE gross strength to 4/5 to improve functional strength for lifting/carrying groceries    Baseline 5/5 strength with all major planes of motion, no reports of arm pain, some  tremor with max effort but no limit on funciton    Time 4    Period Weeks    Status Achieved               PT Long Term Goals - 03/26/21 1058       PT LONG TERM GOAL #1   Title Patient will increase BLE gross strength to 4+/5 as to improve functional strength for independent gait, increased standing tolerance and increased ADL ability.    Time 8    Period Weeks    Status Achieved    Target Date 03/26/21      PT LONG TERM GOAL #2   Title Patient will report a worst pain of 3/10 on VAS in low back  to improve tolerance with ADLs and reduced symptoms with activities.    Baseline Report no significant pain with yard work, wrist reports normal pain he has had chronically for many years but does not limit him at this time    Time 8    Period Weeks    Status Achieved      PT LONG TERM GOAL #3   Title Patient will tolerate standing/walking for at least 30 min with a maximum of 3/10 back pain to exhibit improved tolerance with ADLs such as cooking/cleaning, grocery shopping.    Baseline Patient does not have reports of back pain that is limiting him from ambulating at this time.    Time 8    Period Weeks    Status Partially Met      PT LONG TERM GOAL #4   Title Patient will improve mini best test by 3 points or more indicating decreased risk of falls.    Baseline Mini best score 24 at progress note on 10/4    Time 8    Period Weeks    Status Achieved    Target Date 05/21/21      PT LONG TERM GOAL #5   Title Patient will improve FOTO score by  at least 5% to indicate improved functional mobility with ADLs.    Baseline 58.3 initial eval, FOTO 66    Time 8    Period Weeks    Status Achieved      PT LONG TERM GOAL #6   Title Patient will increase six minute walk test distance to >1300 for progression to community ambulator and improve gait ability close to age group norms.    Baseline 1160 at PN 10/4    Time 8    Period Weeks    Status On-going    Target Date 05/21/21                    Plan - 03/29/21 1020     Clinical Impression Statement Patient Toller treatment session well continues to demonstrate great motivation for completion of physical therapy program.  Patient reports he has been completing exercises at home but does report chronic right medial and proximal thigh pain when performing hip flexion beyond approximately 100 degrees.  This was assessed today as in note.  Patient had no pain with FABER, FADIRor passive hip flexion.  Patient was provided with home exercise of stretching was instructed if any of the stretches cause increase in pain to hold off on them until further assessment may be completed.  Patient continues to demonstrate progress with high-level balance activities as well as high-level strength and functional movements.  Patient will continue to benefit from skilled physical therapy services in order to improve his balance, improve his lower extremity strength, decrease his risk of falls, and improve his overall function.    Personal Factors and Comorbidities Comorbidity 3+;Age    Comorbidities Parkinson's disease, DM x2 (well controlled), chronic low back pain; s/p CABG x5, CKD III    Examination-Activity Limitations Locomotion Level;Squat;Stairs;Stand;Transfers    Examination-Participation Restrictions Community Activity;Shop;Volunteer;Yard Work    Stability/Clinical Decision Making Evolving/Moderate complexity    Rehab Potential Good    PT Frequency 2x / week    PT Duration 6 weeks    PT Treatment/Interventions Cryotherapy;Electrical Stimulation;Moist Heat;Gait training;Stair training;Functional mobility training;Therapeutic activities;Therapeutic exercise;Balance training;Neuromuscular re-education;Patient/family education;Manual techniques;Passive range of motion;Energy conservation    PT Next Visit Plan high level baalbnce, LE muscular strength and power    PT Home Exercise Plan Provided 02/01/21    Consulted and Agree with Plan  of Care Patient             Patient will benefit from skilled therapeutic intervention in order to improve the following deficits and impairments:  Decreased balance, Decreased endurance, Decreased mobility, Difficulty walking, Decreased activity tolerance, Decreased strength, Postural dysfunction, Pain  Visit Diagnosis: Difficulty in walking, not elsewhere classified  Unsteadiness on feet  Muscle weakness (generalized)     Problem List Patient Active Problem List   Diagnosis Date Noted   Anemia, unspecified 02/07/2021   Neutropenia ([place]) 01/29/2021   Thrombocytopenia ([place]) 01/29/2021   Hyponatremia 12/22/2020   Bilateral leg weakness 12/20/2020   Prostate cancer screening 09/08/2020   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[place]) 09/06/2020   Low back pain of over 3 months duration 08/19/2019   Pulmonary hypertension ([place])    Insomnia 12/21/2018   Sleep apnea in adult 12/09/2018   Periodic limb movement disorder 12/09/2018   Lung nodule 07/09/2018   Pulmonary nodules 05/18/2018   Wears hearing aid in both ears 05/17/2018   Leg pain, bilateral 02/20/2018   Dyspnea    Effort angina ([facility])    CKD (chronic kidney disease) stage  3, GFR 30-59 ml/min ([facility]) 09/21/2017   History of skin cancer in adulthood 08/14/2016   Parkinson's disease ([place]) 08/09/2016   Bilateral carotid artery stenosis 04/02/2015   Vertigo, peripheral 10/17/2014   Benign prostatic hypertrophy with urinary frequency 01/31/2014   Encounter for Medicare annual wellness exam 07/02/2013   Obesity 04/03/2013   Other malaise and fatigue 09/21/2012   Hyperlipidemia    Hypertension    3-vessel coronary artery disease    S/P CABG x 5    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 MRN: [id number] Date of Birth: 1946/05/04

## 2021-04-02 ENCOUNTER — Ambulatory Visit: Payer: Medicare Other | Admitting: Physical Therapy

## 2021-04-05 ENCOUNTER — Ambulatory Visit: Payer: Medicare Other | Admitting: Physical Therapy

## 2021-04-05 MED ORDER — DIAZEPAM 5 MG PO TABS: 5.0000 mg | ORAL_TABLET | Freq: Three times a day (TID) | ORAL | 1 refills | Status: DC | PRN

## 2021-04-05 NOTE — Telephone Encounter (Signed)
RX Refill: valium Last Seen: 03-08-21 Last Ordered: 12-20-2018 Next Appt: 08-26-21

## 2021-04-08 MED ORDER — CARVEDILOL 3.125 MG PO TABS: 3.1250 mg | ORAL_TABLET | Freq: Two times a day (BID) | ORAL | 1 refills | Status: DC

## 2021-04-08 NOTE — Telephone Encounter (Signed)
Requested Prescriptions   Signed Prescriptions Disp Refills   carvedilol (COREG) 3.125 MG tablet 180 tablet 1    Sig: Take 1 tablet (3.125 mg total) by mouth 2 (two) times daily.    Authorizing Provider: [name] A    Ordering User: Raelene Bott, Amisadai Woodford L

## 2021-04-09 ENCOUNTER — Encounter: Payer: Self-pay | Admitting: Physical Therapy

## 2021-04-09 ENCOUNTER — Ambulatory Visit: Payer: Medicare Other | Attending: Neurology | Admitting: Physical Therapy

## 2021-04-09 ENCOUNTER — Other Ambulatory Visit: Payer: Self-pay

## 2021-04-09 DIAGNOSIS — R262 Difficulty in walking, not elsewhere classified: Secondary | ICD-10-CM | POA: Diagnosis not present

## 2021-04-09 DIAGNOSIS — R2681 Unsteadiness on feet: Secondary | ICD-10-CM | POA: Insufficient documentation

## 2021-04-09 DIAGNOSIS — R2689 Other abnormalities of gait and mobility: Secondary | ICD-10-CM | POA: Diagnosis not present

## 2021-04-09 DIAGNOSIS — R269 Unspecified abnormalities of gait and mobility: Secondary | ICD-10-CM | POA: Diagnosis not present

## 2021-04-09 DIAGNOSIS — M6281 Muscle weakness (generalized): Secondary | ICD-10-CM | POA: Diagnosis not present

## 2021-04-09 DIAGNOSIS — R278 Other lack of coordination: Secondary | ICD-10-CM | POA: Insufficient documentation

## 2021-04-09 NOTE — Therapy (Signed)
[place] MAIN [facility] SERVICES [address] [place], [place], [address] Phone: [phone]   Fax:  [phone]  Physical Therapy Treatment  Patient Details  Name: Johnathan Arnold. MRN: [id number] Date of Birth: 11/09/1945 Referring Provider (PT): Dr. Carles Collet   Encounter Date: 04/09/2021   PT End of Session - 04/09/21 1523     Visit Number 16    Number of Visits 24    Date for PT Re-Evaluation 05/21/21    Authorization Type Medicare; start of care 06/10/56- 52/77/82 New cert 42/35/36-14/43/15    Progress Note Due on Visit 20    PT Start Time 1016    PT Stop Time 1100    PT Time Calculation (min) 44 min    Equipment Utilized During Treatment Gait belt    Activity Tolerance Patient tolerated treatment well    Behavior During Therapy [facility] for tasks assessed/performed             Past Medical History:  Diagnosis Date   3-vessel coronary artery disease    s/p  5 vessel CABG   Diabetes mellitus without complication ([place])    History of cardiac catheterization 2011   [place]   Hyperlipidemia    Hypertension    Hypertriglyceridemia    Parkinson's disease ([place])    Pneumonia 12/28/2020   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 LIMA to LAD, SVG to D1, OM1 and RPDA   CORONARY ARTERY BYPASS GRAFT  03/1998   5 vessel, [facility]   RIGHT HEART CATH N/A 04/04/2019   Procedure: RIGHT HEART CATH;  Surgeon: Wellington Hampshire, MD;  Location: [place] [facility];  Service: Cardiovascular;  Laterality: N/A;   RIGHT/LEFT HEART CATH AND CORONARY ANGIOGRAPHY N/A 02/01/2018   Procedure: RIGHT/LEFT HEART CATH AND CORONARY ANGIOGRAPHY;  Surgeon: Wellington Hampshire, MD;  Location: [place] [facility];  Service: Cardiovascular;  Laterality: N/A;    There were no vitals filed for this visit.   Subjective Assessment - 04/09/21 1023     Subjective Pt  reports no significant changes since previous session. He does state that he doesn't feel great today, that he is "woozy." When asked, pt states his BP has been within typical range this morning. He does state he did yard work this weekend and now has mild LBP.    Patient is accompained by: Family member    Pertinent History 75 yo Male presents to therapy with history of Parkinson's Disease. He reports in June 2022 he got out of bed one morning and his legs just quit working. He was unable to get up and move around. He reports progressive weakness and not feeling well for about 4-5 weeks. He reports his legs feel weak and he is unable to walk as far. He reports soreness in calf. Patient presents to therapy without AD. he reports he used a cane for a few weeks but doesn't use it anymore. He reports falling a few weeks ago while at the beach. He reports he lost his balance in the sand. He reports 6 weeks ago he was on the portch swing and when he stood up he tried to get his balance and must have passed out. He denies any significant injuries with each fall. He did see his neurologist last week who increased his carbiopa/levadopa. He reports he has been doing  okay with increased medication; He reports having pneumonia but wasn't aware and neurologist believes that exacerbated his Parkinson's Disease. He is doing [employer] which he does 3 days a week; He does have a resting tremor in RUE;  He also reports difficulty writing; He reports difficulty buttoning a button but other dressing is not difficult. In addition he has a history of chronic low back pain which he states has been increasing in center of low back. He reports his back pain worse with standing and activity and alleviates with sitting;    Limitations Standing;Walking    How long can you sit comfortably? no difficulty; sitting alleviates pain;    How long can you stand comfortably? 10-15 min    How long can you walk comfortably? 5-10 min,  worse with carrying something and with walking;    Diagnostic tests MRI in June 2022 Multilevel degenerative changes without stenosis;    Patient Stated Goals "Improve back pain, increase strength in legs and arms"    Currently in Pain? Yes    Pain Score 4     Pain Location Back    Pain Orientation Lower    Pain Descriptors / Indicators Aching    Pain Type Chronic pain                Treatment provided this session   Therex:    Octane level 6 x 5 min for aerobic priming and aerobic conditioning     TRX row 2 x 12 RTB pull aparts  2 x 12     There Act: TRX squat x12, with jump x12 to improve LE strength, power and ability to perform high level transfers such as siting on stools or ground   TRX lunge, alternating   2 x 12 BLE, alternating, to improve LE strength, power and ability to perform high level transfers such as kneeling    TRX side squat x10 to each side.  Patient reports no pain in abductor musculature with this exercise.  To increase weight shifting as well as lower extremity strength and ability to access objects at lower levels.      Manual Therapy:   Supine stretches performed for LBP modulation: Single knee to chest, x30 seconds, BLE Hamstring, 2 x 60 seconds, BLE. Pt educated on how to stretch HS in supine at home using belt/dog leash/etc.; Piriformis stretch, x60 seconds, BLE; Hooklying lower trunk rotation, x 60 seconds each side.  *pt reported decrease in back pain upon completion.      Pt educated throughout session about proper posture and technique with exercises. Improved exercise technique, movement at target joints, use of target muscles after min to mod verbal, visual, tactile cues.    Clinical Impression: Although reporting he feels "woozy" today, pt demonstrates excellent motivation and participation throughout today's session. Neuromuscular Re-ed not completed per pt request due to not feeling well. Therapeutic exercises and activities  were continued with some exercises added, particularly focusing on postural strength. Pt did report improvement in his feeling of wooziness however his LBP continued throughout session. Pt required frequent rest breaks due to SOB, muscular fatigue and LBP. Back pain decreased after lower body stretching. PT discovered hamstring length is significantly decreased - pt educated on how to stretch at home. Pt will benefit from continued skilled therapy interventions in order to improve his balance, LE strength, decrease fall risk and improve overall function.        PT Short Term Goals - 03/12/21 1023  PT SHORT TERM GOAL #1   Title Patient will be adherent to HEP at least 3x a week to improve functional strength and balance for better safety at home.    Baseline Pt performing his HEP based on subjective reports (10/17/2019)    Time 4    Period Weeks    Status New    Target Date 02/26/21      PT SHORT TERM GOAL #2   Title Patient will improve BUE gross strength to 4/5 to improve functional strength for lifting/carrying groceries    Baseline 5/5 strength with all major planes of motion, no reports of arm pain, some tremor with max effort but no limit on funciton    Time 4    Period Weeks    Status Achieved               PT Long Term Goals - 03/26/21 1058       PT LONG TERM GOAL #1   Title Patient will increase BLE gross strength to 4+/5 as to improve functional strength for independent gait, increased standing tolerance and increased ADL ability.    Time 8    Period Weeks    Status Achieved    Target Date 03/26/21      PT LONG TERM GOAL #2   Title Patient will report a worst pain of 3/10 on VAS in low back  to improve tolerance with ADLs and reduced symptoms with activities.    Baseline Report no significant pain with yard work, wrist reports normal pain he has had chronically for many years but does not limit him at this time    Time 8    Period Weeks    Status Achieved       PT LONG TERM GOAL #3   Title Patient will tolerate standing/walking for at least 30 min with a maximum of 3/10 back pain to exhibit improved tolerance with ADLs such as cooking/cleaning, grocery shopping.    Baseline Patient does not have reports of back pain that is limiting him from ambulating at this time.    Time 8    Period Weeks    Status Partially Met      PT LONG TERM GOAL #4   Title Patient will improve mini best test by 3 points or more indicating decreased risk of falls.    Baseline Mini best score 24 at progress note on 10/4    Time 8    Period Weeks    Status Achieved    Target Date 05/21/21      PT LONG TERM GOAL #5   Title Patient will improve FOTO score by at least 5% to indicate improved functional mobility with ADLs.    Baseline 58.3 initial eval, FOTO 66    Time 8    Period Weeks    Status Achieved      PT LONG TERM GOAL #6   Title Patient will increase six minute walk test distance to >1300 for progression to community ambulator and improve gait ability close to age group norms.    Baseline 1160 at PN 10/4    Time 8    Period Weeks    Status On-going    Target Date 05/21/21                   Plan - 04/09/21 1524     Clinical Impression Statement Although reporting he feels "woozy" today, pt demonstrates excellent motivation and participation throughout today's session. Neuromuscular  Re-ed not completed per pt request due to not feeling well. Therapeutic exercises and activities were continued with some exercises added, particularly focusing on postural strength. Pt did report improvement in his feeling of wooziness however his LBP continued throughout session. Pt required frequent rest breaks due to SOB, muscular fatigue and LBP. Back pain decreased after lower body stretching. PT discovered hamstring length is significantly decreased - pt educated on how to stretch at home. Pt will benefit from continued skilled therapy interventions in order to  improve his balance, LE strength, decrease fall risk and improve overall function.    Personal Factors and Comorbidities Comorbidity 3+;Age    Comorbidities Parkinson's disease, DM x2 (well controlled), chronic low back pain; s/p CABG x5, CKD III    Examination-Activity Limitations Locomotion Level;Squat;Stairs;Stand;Transfers    Examination-Participation Restrictions Community Activity;Shop;Volunteer;Yard Work    Stability/Clinical Decision Making Evolving/Moderate complexity    Rehab Potential Good    PT Frequency 2x / week    PT Duration 6 weeks    PT Treatment/Interventions Cryotherapy;Electrical Stimulation;Moist Heat;Gait training;Stair training;Functional mobility training;Therapeutic activities;Therapeutic exercise;Balance training;Neuromuscular re-education;Patient/family education;Manual techniques;Passive range of motion;Energy conservation    PT Next Visit Plan high level baalbnce, LE muscular strength and power    PT Home Exercise Plan Provided 02/01/21    Consulted and Agree with Plan of Care Patient             Patient will benefit from skilled therapeutic intervention in order to improve the following deficits and impairments:  Decreased balance, Decreased endurance, Decreased mobility, Difficulty walking, Decreased activity tolerance, Decreased strength, Postural dysfunction, Pain  Visit Diagnosis: Abnormality of gait and mobility  Other abnormalities of gait and mobility  Difficulty in walking, not elsewhere classified  Other lack of coordination  Muscle weakness (generalized)  Unsteadiness on feet     Problem List Patient Active Problem List   Diagnosis Date Noted   Anemia, unspecified 02/07/2021   Neutropenia ([place]) 01/29/2021   Thrombocytopenia ([place]) 01/29/2021   Hyponatremia 12/22/2020   Bilateral leg weakness 12/20/2020   Prostate cancer screening 09/08/2020   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[place]) 09/06/2020   Low back pain  of over 3 months duration 08/19/2019   Pulmonary hypertension ([place])    Insomnia 12/21/2018   Sleep apnea in adult 12/09/2018   Periodic limb movement disorder 12/09/2018   Lung nodule 07/09/2018   Pulmonary nodules 05/18/2018   Wears hearing aid in both ears 05/17/2018   Leg pain, bilateral 02/20/2018   Dyspnea    Effort angina ([facility])    CKD (chronic kidney disease) stage 3, GFR 30-59 ml/min ([place]) 09/21/2017   History of skin cancer in adulthood 08/14/2016   Parkinson's disease ([place]) 08/09/2016   Bilateral carotid artery stenosis 04/02/2015   Vertigo, peripheral 10/17/2014   Benign prostatic hypertrophy with urinary frequency 01/31/2014   Encounter for Medicare annual wellness exam 07/02/2013   Obesity 04/03/2013   Other malaise and fatigue 09/21/2012   Hyperlipidemia    Hypertension    3-vessel coronary artery disease    S/P CABG x 5    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 [place], [place], [address] Phone: [phone]   Fax:  [phone]  Name: Johnathan Arnold. MRN: [id number] Date of Birth: June 02, 1946

## 2021-04-12 ENCOUNTER — Encounter: Payer: Self-pay | Admitting: Physical Therapy

## 2021-04-12 ENCOUNTER — Ambulatory Visit: Payer: Medicare Other | Admitting: Physical Therapy

## 2021-04-12 DIAGNOSIS — R278 Other lack of coordination: Secondary | ICD-10-CM

## 2021-04-12 DIAGNOSIS — R2681 Unsteadiness on feet: Secondary | ICD-10-CM

## 2021-04-12 DIAGNOSIS — R262 Difficulty in walking, not elsewhere classified: Secondary | ICD-10-CM

## 2021-04-12 DIAGNOSIS — R2689 Other abnormalities of gait and mobility: Secondary | ICD-10-CM | POA: Diagnosis not present

## 2021-04-12 DIAGNOSIS — R269 Unspecified abnormalities of gait and mobility: Secondary | ICD-10-CM | POA: Diagnosis not present

## 2021-04-12 DIAGNOSIS — M6281 Muscle weakness (generalized): Secondary | ICD-10-CM | POA: Diagnosis not present

## 2021-04-12 NOTE — Therapy (Signed)
Johnathan Arnold MAIN [facility] SERVICES [address] [place], [place], [address] Phone: [phone]   Fax:  [phone]  Physical Therapy Treatment  Patient Details  Name: Johnathan Arnold. MRN: [id number] Date of Birth: 12/16/1945 Referring Provider (PT): Dr. Carles Collet   Encounter Date: 04/12/2021   PT End of Session - 04/12/21 1112     Visit Number 17    Number of Visits 24    Date for PT Re-Evaluation 05/21/21    Authorization Type Medicare; start of care 6/95/07- 22/57/50 New cert 51/83/35-82/51/89    Progress Note Due on Visit 20    PT Start Time 1016    PT Stop Time 1100    PT Time Calculation (min) 44 min    Equipment Utilized During Treatment Gait belt    Activity Tolerance Patient tolerated treatment well    Behavior During Therapy [facility] for tasks assessed/performed             Past Medical History:  Diagnosis Date   3-vessel coronary artery disease    s/p  5 vessel CABG   Diabetes mellitus without complication ([place])    History of cardiac catheterization 2011   [place]   Hyperlipidemia    Hypertension    Hypertriglyceridemia    Parkinson's disease ([place])    Pneumonia 12/28/2020   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 LIMA to LAD, SVG to D1, OM1 and RPDA   CORONARY ARTERY BYPASS GRAFT  03/1998   5 vessel, [facility]   RIGHT HEART CATH N/A 04/04/2019   Procedure: RIGHT HEART CATH;  Surgeon: Wellington Hampshire, MD;  Location: [place] [facility];  Service: Cardiovascular;  Laterality: N/A;   RIGHT/LEFT HEART CATH AND CORONARY ANGIOGRAPHY N/A 02/01/2018   Procedure: RIGHT/LEFT HEART CATH AND CORONARY ANGIOGRAPHY;  Surgeon: Wellington Hampshire, MD;  Location: [place] [facility];  Service: Cardiovascular;  Laterality: N/A;    There were no vitals filed for this visit.   Subjective Assessment - 04/12/21 1020     Subjective Pt  states "wooziness" has cleared up since session earlier this week. He states he is "still recovering" from previous session. He does endorse mild LBP. No falls or LOB.    Pertinent History 75 yo Male presents to therapy with history of Parkinson's Disease. He reports in June 2022 he got out of bed one morning and his legs just quit working. He was unable to get up and move around. He reports progressive weakness and not feeling well for about 4-5 weeks. He reports his legs feel weak and he is unable to walk as far. He reports soreness in calf. Patient presents to therapy without AD. he reports he used a cane for a few weeks but doesn't use it anymore. He reports falling a few weeks ago while at the beach. He reports he lost his balance in the sand. He reports 6 weeks ago he was on the portch swing and when he stood up he tried to get his balance and must have passed out. He denies any significant injuries with each fall. He did see his neurologist last week who increased his carbiopa/levadopa. He reports he has been doing okay with increased medication; He reports having pneumonia but wasn't aware and neurologist believes that exacerbated his Parkinson's Disease. He is doing [employer] which he does  3 days a week; He does have a resting tremor in RUE;  He also reports difficulty writing; He reports difficulty buttoning a button but other dressing is not difficult. In addition he has a history of chronic low back pain which he states has been increasing in center of low back. He reports his back pain worse with standing and activity and alleviates with sitting;    Limitations Standing;Walking    How long can you sit comfortably? no difficulty; sitting alleviates pain;    How long can you stand comfortably? 10-15 min    How long can you walk comfortably? 5-10 min, worse with carrying something and with walking;    Diagnostic tests MRI in June 2022 Multilevel degenerative changes without stenosis;     Patient Stated Goals "Improve back pain, increase strength in legs and arms"    Currently in Pain? Yes    Pain Score 3     Pain Location Back    Pain Orientation Lower    Pain Descriptors / Indicators Aching    Pain Type Chronic pain              Treatment provided this session   Therex:    Octane level 6 x 5 min for aerobic priming and aerobic conditioning     Supine lower trunk rotations, x10 each side with brief hold; Precor leg press 3x15 reps at 75#; Precor calf raise 3x15 reps at 60#; Hip extension with 3 second hold, YTB, 2x10 BLE; Lateral stepping, YTB, 2x15 steps each side; Seated hamstring curl, RTB, 2x15; VC on eccentric control.       Manual Therapy:   Supine stretches performed for LBP modulation: Single knee to chest, x30 seconds, BLE Hamstring, 2 x 60 seconds, BLE. Piriformis stretch, x60 seconds, BLE; Hooklying lower trunk rotation, 2 x 30 seconds each side. Hip flexor/quad stretch with leg off side of mat, 2 x 45 seconds BLE.   *pt reported decrease in back pain upon completion.      Pt educated throughout session about proper posture and technique with exercises. Improved exercise technique, movement at target joints, use of target muscles after min to mod verbal, visual, tactile cues.       Clinical Impression: Pt demonstrates excellent motivation and participation throughout today's session; he does endorse muscular fatigue lasting for 2 days, onset from previous session. PT dialed back execises today to prevent excessive DOMS. Stretching was utilized at beginning of session with pt reporting decrease in pain following lower body stretching. PT once again educated on performance of flexibility exercises at home to alleviate back pain on non-PT days. Pt then performed a comprehensive lower body strengthening program with moderate weight and high volume of reps. PT would like to incorporate some therex with more challenging therapeutic activities next  session to find a balance that works for the pt. Pt will benefit from continued skilled therapy interventions in order to improve his balance, LE strength, decrease fall risk and improve overall function.            PT Short Term Goals - 03/12/21 1023       PT SHORT TERM GOAL #1   Title Patient will be adherent to HEP at least 3x a week to improve functional strength and balance for better safety at home.    Baseline Pt performing his HEP based on subjective reports (10/17/2019)    Time 4    Period Weeks    Status New    Target  Date 02/26/21      PT SHORT TERM GOAL #2   Title Patient will improve BUE gross strength to 4/5 to improve functional strength for lifting/carrying groceries    Baseline 5/5 strength with all major planes of motion, no reports of arm pain, some tremor with max effort but no limit on funciton    Time 4    Period Weeks    Status Achieved               PT Long Term Goals - 03/26/21 1058       PT LONG TERM GOAL #1   Title Patient will increase BLE gross strength to 4+/5 as to improve functional strength for independent gait, increased standing tolerance and increased ADL ability.    Time 8    Period Weeks    Status Achieved    Target Date 03/26/21      PT LONG TERM GOAL #2   Title Patient will report a worst pain of 3/10 on VAS in low back  to improve tolerance with ADLs and reduced symptoms with activities.    Baseline Report no significant pain with yard work, wrist reports normal pain he has had chronically for many years but does not limit him at this time    Time 8    Period Weeks    Status Achieved      PT LONG TERM GOAL #3   Title Patient will tolerate standing/walking for at least 30 min with a maximum of 3/10 back pain to exhibit improved tolerance with ADLs such as cooking/cleaning, grocery shopping.    Baseline Patient does not have reports of back pain that is limiting him from ambulating at this time.    Time 8    Period Weeks     Status Partially Met      PT LONG TERM GOAL #4   Title Patient will improve mini best test by 3 points or more indicating decreased risk of falls.    Baseline Mini best score 24 at progress note on 10/4    Time 8    Period Weeks    Status Achieved    Target Date 05/21/21      PT LONG TERM GOAL #5   Title Patient will improve FOTO score by at least 5% to indicate improved functional mobility with ADLs.    Baseline 58.3 initial eval, FOTO 66    Time 8    Period Weeks    Status Achieved      PT LONG TERM GOAL #6   Title Patient will increase six minute walk test distance to >1300 for progression to community ambulator and improve gait ability close to age group norms.    Baseline 1160 at PN 10/4    Time 8    Period Weeks    Status On-going    Target Date 05/21/21                   Plan - 04/12/21 1120     Clinical Impression Statement Pt demonstrates excellent motivation and participation throughout today's session; he does endorse muscular fatigue lasting for 2 days, onset from previous session. PT dialed back execises today to prevent excessive DOMS. Stretching was utilized at beginning of session with pt reporting decrease in pain following lower body stretching. PT once again educated on performance of flexibility exercises at home to alleviate back pain on non-PT days. Pt then performed a comprehensive lower body strengthening program with moderate weight  and high volume of reps. PT would like to incorporate some therex with more challenging therapeutic activities next session to find a balance that works for the pt. Pt will benefit from continued skilled therapy interventions in order to improve his balance, LE strength, decrease fall risk and improve overall function.    Personal Factors and Comorbidities Comorbidity 3+;Age    Comorbidities Parkinson's disease, DM x2 (well controlled), chronic low back pain; s/p CABG x5, CKD III    Examination-Activity Limitations  Locomotion Level;Squat;Stairs;Stand;Transfers    Examination-Participation Restrictions Community Activity;Shop;Volunteer;Yard Work    Stability/Clinical Decision Making Evolving/Moderate complexity    Rehab Potential Good    PT Frequency 2x / week    PT Duration 6 weeks    PT Treatment/Interventions Cryotherapy;Electrical Stimulation;Moist Heat;Gait training;Stair training;Functional mobility training;Therapeutic activities;Therapeutic exercise;Balance training;Neuromuscular re-education;Patient/family education;Manual techniques;Passive range of motion;Energy conservation    PT Next Visit Plan high level balance, LE muscular strength and power    PT Home Exercise Plan Provided 02/01/21    Consulted and Agree with Plan of Care Patient             Patient will benefit from skilled therapeutic intervention in order to improve the following deficits and impairments:  Decreased balance, Decreased endurance, Decreased mobility, Difficulty walking, Decreased activity tolerance, Decreased strength, Postural dysfunction, Pain  Visit Diagnosis: Muscle weakness (generalized)  Unsteadiness on feet  Other lack of coordination  Difficulty in walking, not elsewhere classified  Abnormality of gait and mobility  Other abnormalities of gait and mobility     Problem List Patient Active Problem List   Diagnosis Date Noted   Anemia, unspecified 02/07/2021   Neutropenia ([place]) 01/29/2021   Thrombocytopenia ([place]) 01/29/2021   Hyponatremia 12/22/2020   Bilateral leg weakness 12/20/2020   Prostate cancer screening 09/08/2020   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[place]) 09/06/2020   Low back pain of over 3 months duration 08/19/2019   Pulmonary hypertension ([place])    Insomnia 12/21/2018   Sleep apnea in adult 12/09/2018   Periodic limb movement disorder 12/09/2018   Lung nodule 07/09/2018   Pulmonary nodules 05/18/2018   Wears hearing aid in both ears 05/17/2018   Leg  pain, bilateral 02/20/2018   Dyspnea    Effort angina ([facility])    CKD (chronic kidney disease) stage 3, GFR 30-59 ml/min ([place]) 09/21/2017   History of skin cancer in adulthood 08/14/2016   Parkinson's disease ([place]) 08/09/2016   Bilateral carotid artery stenosis 04/02/2015   Vertigo, peripheral 10/17/2014   Benign prostatic hypertrophy with urinary frequency 01/31/2014   Encounter for Medicare annual wellness exam 07/02/2013   Obesity 04/03/2013   Other malaise and fatigue 09/21/2012   Hyperlipidemia    Hypertension    3-vessel coronary artery disease    S/P CABG x 5    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 [place], [place], [address] Phone: [phone]   Fax:  [phone]  Name: Johnathan Arnold. MRN: [id number] Date of Birth: 23-Jan-1946

## 2021-04-16 ENCOUNTER — Other Ambulatory Visit: Payer: Self-pay

## 2021-04-16 ENCOUNTER — Encounter: Payer: Self-pay | Admitting: Physical Therapy

## 2021-04-16 ENCOUNTER — Ambulatory Visit: Payer: Medicare Other | Admitting: Physical Therapy

## 2021-04-16 DIAGNOSIS — R2681 Unsteadiness on feet: Secondary | ICD-10-CM | POA: Diagnosis not present

## 2021-04-16 DIAGNOSIS — R269 Unspecified abnormalities of gait and mobility: Secondary | ICD-10-CM | POA: Diagnosis not present

## 2021-04-16 DIAGNOSIS — M6281 Muscle weakness (generalized): Secondary | ICD-10-CM

## 2021-04-16 DIAGNOSIS — R2689 Other abnormalities of gait and mobility: Secondary | ICD-10-CM

## 2021-04-16 DIAGNOSIS — R278 Other lack of coordination: Secondary | ICD-10-CM

## 2021-04-16 DIAGNOSIS — R262 Difficulty in walking, not elsewhere classified: Secondary | ICD-10-CM

## 2021-04-16 NOTE — Therapy (Signed)
[place] MAIN [facility] SERVICES [address] [place], [place], [address] Phone: [phone]   Fax:  [phone]  Physical Therapy Treatment  Patient Details  Name: Johnathan Arnold. MRN: [id number] Date of Birth: 1945-10-14 Referring Provider (PT): Dr. Carles Collet   Encounter Date: 04/16/2021   PT End of Session - 04/16/21 1022     Visit Number 18    Number of Visits 24    Date for PT Re-Evaluation 05/21/21    Authorization Type Medicare; start of care 0/34/91- 79/15/05 New cert 69/79/48-01/65/53    Progress Note Due on Visit 75    PT Start Time 1015    PT Stop Time 1100    PT Time Calculation (min) 45 min    Equipment Utilized During Treatment Gait belt    Activity Tolerance Patient tolerated treatment well    Behavior During Therapy [facility] for tasks assessed/performed             Past Medical History:  Diagnosis Date   3-vessel coronary artery disease    s/p  5 vessel CABG   Diabetes mellitus without complication ([place])    History of cardiac catheterization 2011   [place]   Hyperlipidemia    Hypertension    Hypertriglyceridemia    Parkinson's disease ([place])    Pneumonia 12/28/2020   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 LIMA to LAD, SVG to D1, OM1 and RPDA   CORONARY ARTERY BYPASS GRAFT  03/1998   5 vessel, [facility]   RIGHT HEART CATH N/A 04/04/2019   Procedure: RIGHT HEART CATH;  Surgeon: Wellington Hampshire, MD;  Location: [place] CV LAB;  Service: Cardiovascular;  Laterality: N/A;   RIGHT/LEFT HEART CATH AND CORONARY ANGIOGRAPHY N/A 02/01/2018   Procedure: RIGHT/LEFT HEART CATH AND CORONARY ANGIOGRAPHY;  Surgeon: Wellington Hampshire, MD;  Location: [place] CV LAB;  Service: Cardiovascular;  Laterality: N/A;    There were no vitals filed for this visit.   Subjective Assessment - 04/16/21 1020     Subjective Pt  states his low back continues to be "a little sore", rating it 3/10. Pt denies falls, LOB. No questions or concerns at this time.    Patient is accompained by: Family member    Pertinent History 75 yo Male presents to therapy with history of Parkinson's Disease. He reports in June 2022 he got out of bed one morning and his legs just quit working. He was unable to get up and move around. He reports progressive weakness and not feeling well for about 4-5 weeks. He reports his legs feel weak and he is unable to walk as far. He reports soreness in calf. Patient presents to therapy without AD. he reports he used a cane for a few weeks but doesn't use it anymore. He reports falling a few weeks ago while at the beach. He reports he lost his balance in the sand. He reports 6 weeks ago he was on the portch swing and when he stood up he tried to get his balance and must have passed out. He denies any significant injuries with each fall. He did see his neurologist last week who increased his carbiopa/levadopa. He reports he has been doing okay with increased medication; He reports having pneumonia but wasn't aware and neurologist believes that exacerbated his Parkinson's Disease. He is doing [employer]  [employer] boxing which he does 3 days a week; He does have a resting tremor in RUE;  He also reports difficulty writing; He reports difficulty buttoning a button but other dressing is not difficult. In addition he has a history of chronic low back pain which he states has been increasing in center of low back. He reports his back pain worse with standing and activity and alleviates with sitting;    Limitations Standing;Walking    How long can you sit comfortably? no difficulty; sitting alleviates pain;    How long can you stand comfortably? 10-15 min    How long can you walk comfortably? 5-10 min, worse with carrying something and with walking;    Diagnostic tests MRI in June 2022 Multilevel degenerative changes without stenosis;     Patient Stated Goals "Improve back pain, increase strength in legs and arms"    Currently in Pain? Yes    Pain Score 3     Pain Location Back    Pain Orientation Lower    Pain Descriptors / Indicators Aching    Pain Type Chronic pain             Treatment provided this session   Therex:    Octane level 6 x 5 min for aerobic priming and aerobic conditioning     Supine lower trunk rotations, x10 each side with brief hold; Precor leg press 3x15 reps at 75#; Precor calf raise 3x15 reps at 60#;    Neuromuscular Re-ed: Forward lunge onto BOSU ball (dome side up), alternating, 2x10 each side. Static standing on BOSU ball (flat side up), 2x60 seconds. Weight-shifting on BOSU ball (falt side up), 2x10 shifts each side. Resisted lateral stepping on Airex beam with GTB at ankles, x30 steps each side. Lateral stepping on airex beam over 2 hedgehogs, 2x10 laps each side. Alternating toe taps to cone from standing on airex pad, 2x10 taps each side.    Pt educated throughout session about proper posture and technique with exercises. Improved exercise technique, movement at target joints, use of target muscles after min to mod verbal, visual, tactile cues.       Clinical Impression: Pt demonstrates excellent motivation throughout today's session, agreeable to all new exercises provided by PT challenging both strength and stability. Pt showed significant improvement in execution of neuromuscular re-ed exercises during the second set. CGA was provided at all times to steady due to occasional LOB. He did present with muscular fatigue during lunges onto BOSU. At end of session, pt reported he felt challenged and enjoyed the exercises today. Pt will benefit from continued skilled therapy interventions in order to improve his balance, LE strength, decrease fall risk and improve overall function.               PT Short Term Goals - 03/12/21 1023       PT SHORT TERM GOAL #1   Title  Patient will be adherent to HEP at least 3x a week to improve functional strength and balance for better safety at home.    Baseline Pt performing his HEP based on subjective reports (10/17/2019)    Time 4    Period Weeks    Status New    Target Date 02/26/21      PT SHORT TERM GOAL #2   Title Patient will improve BUE gross strength to 4/5 to improve functional strength for lifting/carrying groceries    Baseline 5/5 strength with all major planes of motion, no reports of arm  pain, some tremor with max effort but no limit on funciton    Time 4    Period Weeks    Status Achieved               PT Long Term Goals - 03/26/21 1058       PT LONG TERM GOAL #1   Title Patient will increase BLE gross strength to 4+/5 as to improve functional strength for independent gait, increased standing tolerance and increased ADL ability.    Time 8    Period Weeks    Status Achieved    Target Date 03/26/21      PT LONG TERM GOAL #2   Title Patient will report a worst pain of 3/10 on VAS in low back  to improve tolerance with ADLs and reduced symptoms with activities.    Baseline Report no significant pain with yard work, wrist reports normal pain he has had chronically for many years but does not limit him at this time    Time 8    Period Weeks    Status Achieved      PT LONG TERM GOAL #3   Title Patient will tolerate standing/walking for at least 30 min with a maximum of 3/10 back pain to exhibit improved tolerance with ADLs such as cooking/cleaning, grocery shopping.    Baseline Patient does not have reports of back pain that is limiting him from ambulating at this time.    Time 8    Period Weeks    Status Partially Met      PT LONG TERM GOAL #4   Title Patient will improve mini best test by 3 points or more indicating decreased risk of falls.    Baseline Mini best score 24 at progress note on 10/4    Time 8    Period Weeks    Status Achieved    Target Date 05/21/21      PT LONG TERM  GOAL #5   Title Patient will improve FOTO score by at least 5% to indicate improved functional mobility with ADLs.    Baseline 58.3 initial eval, FOTO 66    Time 8    Period Weeks    Status Achieved      PT LONG TERM GOAL #6   Title Patient will increase six minute walk test distance to >1300 for progression to community ambulator and improve gait ability close to age group norms.    Baseline 1160 at PN 10/4    Time 8    Period Weeks    Status On-going    Target Date 05/21/21                   Plan - 04/16/21 1222     Clinical Impression Statement Pt demonstrates excellent motivation throughout today's session, agreeable to all new exercises provided by PT challenging both strength and stability. Pt showed significant improvement in execution of neuromuscular re-ed exercises during the second set. CGA was provided at all times to steady due to occasional LOB. He did present with muscular fatigue during lunges onto BOSU. At end of session, pt reported he felt challenged and enjoyed the exercises today. Pt will benefit from continued skilled therapy interventions in order to improve his balance, LE strength, decrease fall risk and improve overall function.    Personal Factors and Comorbidities Comorbidity 3+;Age    Comorbidities Parkinson's disease, DM x2 (well controlled), chronic low back pain; s/p CABG x5, CKD III  Examination-Activity Limitations Locomotion Level;Squat;Stairs;Stand;Transfers    Examination-Participation Restrictions Community Activity;Shop;Volunteer;Yard Work    Stability/Clinical Decision Making Evolving/Moderate complexity    Rehab Potential Good    PT Frequency 2x / week    PT Duration 6 weeks    PT Treatment/Interventions Cryotherapy;Electrical Stimulation;Moist Heat;Gait training;Stair training;Functional mobility training;Therapeutic activities;Therapeutic exercise;Balance training;Neuromuscular re-education;Patient/family education;Manual  techniques;Passive range of motion;Energy conservation    PT Next Visit Plan high level balance, LE muscular strength and power    PT Home Exercise Plan Provided 02/01/21    Consulted and Agree with Plan of Care Patient             Patient will benefit from skilled therapeutic intervention in order to improve the following deficits and impairments:  Decreased balance, Decreased endurance, Decreased mobility, Difficulty walking, Decreased activity tolerance, Decreased strength, Postural dysfunction, Pain  Visit Diagnosis: Abnormality of gait and mobility  Other lack of coordination  Difficulty in walking, not elsewhere classified  Unsteadiness on feet  Muscle weakness (generalized)  Other abnormalities of gait and mobility     Problem List Patient Active Problem List   Diagnosis Date Noted   Anemia, unspecified 02/07/2021   Neutropenia ([place]) 01/29/2021   Thrombocytopenia ([place]) 01/29/2021   Hyponatremia 12/22/2020   Bilateral leg weakness 12/20/2020   Prostate cancer screening 09/08/2020   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[place]) 09/06/2020   Low back pain of over 3 months duration 08/19/2019   Pulmonary hypertension ([place])    Insomnia 12/21/2018   Sleep apnea in adult 12/09/2018   Periodic limb movement disorder 12/09/2018   Lung nodule 07/09/2018   Pulmonary nodules 05/18/2018   Wears hearing aid in both ears 05/17/2018   Leg pain, bilateral 02/20/2018   Dyspnea    Effort angina ([facility])    CKD (chronic kidney disease) stage 3, GFR 30-59 ml/min ([place]) 09/21/2017   History of skin cancer in adulthood 08/14/2016   Parkinson's disease ([place]) 08/09/2016   Bilateral carotid artery stenosis 04/02/2015   Vertigo, peripheral 10/17/2014   Benign prostatic hypertrophy with urinary frequency 01/31/2014   Encounter for Medicare annual wellness exam 07/02/2013   Obesity 04/03/2013   Other malaise and fatigue 09/21/2012   Hyperlipidemia    Hypertension     3-vessel coronary artery disease    S/P CABG x 5    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 [place], [place], [address] Phone: [phone]   Fax:  [phone]  Name: Pearl Berlinger. MRN: [id number] Date of Birth: [birth year]

## 2021-04-19 ENCOUNTER — Other Ambulatory Visit: Payer: Self-pay

## 2021-04-19 ENCOUNTER — Encounter: Payer: Self-pay | Admitting: Physical Therapy

## 2021-04-19 ENCOUNTER — Ambulatory Visit: Payer: Medicare Other | Admitting: Physical Therapy

## 2021-04-19 DIAGNOSIS — R269 Unspecified abnormalities of gait and mobility: Secondary | ICD-10-CM

## 2021-04-19 DIAGNOSIS — M6281 Muscle weakness (generalized): Secondary | ICD-10-CM

## 2021-04-19 DIAGNOSIS — R278 Other lack of coordination: Secondary | ICD-10-CM | POA: Diagnosis not present

## 2021-04-19 DIAGNOSIS — R2689 Other abnormalities of gait and mobility: Secondary | ICD-10-CM

## 2021-04-19 DIAGNOSIS — R2681 Unsteadiness on feet: Secondary | ICD-10-CM | POA: Diagnosis not present

## 2021-04-19 DIAGNOSIS — R262 Difficulty in walking, not elsewhere classified: Secondary | ICD-10-CM | POA: Diagnosis not present

## 2021-04-19 NOTE — Therapy (Signed)
[place] MAIN [facility] SERVICES [address] [place], [place], [address] Phone: [phone]   Fax:  [phone]  Physical Therapy Treatment  Patient Details  Name: Johnathan Arnold. MRN: [id number] Date of Birth: March 02, 1946 Referring Provider (PT): Dr. Carles Collet   Encounter Date: 04/19/2021   PT End of Session - 04/19/21 1110     Visit Number 19    Number of Visits 24    Date for PT Re-Evaluation 05/21/21    Authorization Type Medicare; start of care 6/83/72- 90/21/11 New cert 55/20/80-22/33/61    Progress Note Due on Visit 20    PT Start Time 1020    PT Stop Time 1058    PT Time Calculation (min) 38 min    Equipment Utilized During Treatment Gait belt    Activity Tolerance Patient tolerated treatment well    Behavior During Therapy [facility] for tasks assessed/performed             Past Medical History:  Diagnosis Date   3-vessel coronary artery disease    s/p  5 vessel CABG   Diabetes mellitus without complication ([place])    History of cardiac catheterization 2011   [place]   Hyperlipidemia    Hypertension    Hypertriglyceridemia    Parkinson's disease ([place])    Pneumonia 12/28/2020   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 LIMA to LAD, SVG to D1, OM1 and RPDA   CORONARY ARTERY BYPASS GRAFT  03/1998   5 vessel, [facility]   RIGHT HEART CATH N/A 04/04/2019   Procedure: RIGHT HEART CATH;  Surgeon: Wellington Hampshire, MD;  Location: [place] CV LAB;  Service: Cardiovascular;  Laterality: N/A;   RIGHT/LEFT HEART CATH AND CORONARY ANGIOGRAPHY N/A 02/01/2018   Procedure: RIGHT/LEFT HEART CATH AND CORONARY ANGIOGRAPHY;  Surgeon: Wellington Hampshire, MD;  Location: [place] CV LAB;  Service: Cardiovascular;  Laterality: N/A;    There were no vitals filed for this visit.   Subjective Assessment - 04/19/21 1024     Subjective  Pt states his back is beginning to feel better, denies pain today. He reports no falls/LOB or major changes since last session. He does state his legs felt fatigued after last session.    Patient is accompained by: Family member    Pertinent History 75 yo Male presents to therapy with history of Parkinson's Disease. He reports in June 2022 he got out of bed one morning and his legs just quit working. He was unable to get up and move around. He reports progressive weakness and not feeling well for about 4-5 weeks. He reports his legs feel weak and he is unable to walk as far. He reports soreness in calf. Patient presents to therapy without AD. he reports he used a cane for a few weeks but doesn't use it anymore. He reports falling a few weeks ago while at the beach. He reports he lost his balance in the sand. He reports 6 weeks ago he was on the portch swing and when he stood up he tried to get his balance and must have passed out. He denies any significant injuries with each fall. He did see his neurologist last week who increased his carbiopa/levadopa. He reports he has been doing okay with increased medication; He reports having pneumonia but wasn't aware and neurologist believes that exacerbated  his Parkinson's Disease. He is doing [employer] which he does 3 days a week; He does have a resting tremor in RUE;  He also reports difficulty writing; He reports difficulty buttoning a button but other dressing is not difficult. In addition he has a history of chronic low back pain which he states has been increasing in center of low back. He reports his back pain worse with standing and activity and alleviates with sitting;    Limitations Standing;Walking    How long can you sit comfortably? no difficulty; sitting alleviates pain;    How long can you stand comfortably? 10-15 min    How long can you walk comfortably? 5-10 min, worse with carrying something and with walking;    Diagnostic tests MRI in June  2022 Multilevel degenerative changes without stenosis;    Patient Stated Goals "Improve back pain, increase strength in legs and arms"    Currently in Pain? No/denies              Treatment provided this session   Therex:    Octane level 6 x 5 min for aerobic priming and aerobic conditioning    Full-range squats, 2 x 10 reps.     Neuromuscular Re-ed: Forward lunge onto BOSU ball (dome side up), alternating, 2x10 each side. Static standing on BOSU ball (flat side up), 3x60 seconds. Weight-shifting on BOSU ball (flat side up), 3 minutes, progressively increasing weight shift. 1/4 squats on BOSU ball (flat side up), 2x10 reps. Heel raises on 1/2 foam roll, 2x15 reps. Light UE support; decreased ROM when performed with no UE support.  Tandem stance on 1/2 foam roll; 2x1 minute each side.       Pt educated throughout session about proper posture and technique with exercises. Improved exercise technique, movement at target joints, use of target muscles after min to mod verbal, visual, tactile cues.       Clinical Impression: Pt demonstrates excellent motivation throughout today's session. Neuromuscular re-ed continued utilizing BOSU ball. Stability exercises on 1/2 foam roll were introduced with lateral LOB during tandem stance, rightward LOB more frequent than left. Pt showed significant improvement in execution of neuromuscular re-ed exercises during the second set of each exercise. CGA was provided at all times to steady due to occasional LOB. Appropriate level of muscular fatigue was reported by patient throughout session. Pt will benefit from continued skilled therapy interventions in order to improve his balance, LE strength, decrease fall risk and improve overall function.           PT Short Term Goals - 03/12/21 1023       PT SHORT TERM GOAL #1   Title Patient will be adherent to HEP at least 3x a week to improve functional strength and balance for better safety at  home.    Baseline Pt performing his HEP based on subjective reports (10/17/2019)    Time 4    Period Weeks    Status New    Target Date 02/26/21      PT SHORT TERM GOAL #2   Title Patient will improve BUE gross strength to 4/5 to improve functional strength for lifting/carrying groceries    Baseline 5/5 strength with all major planes of motion, no reports of arm pain, some tremor with max effort but no limit on funciton    Time 4    Period Weeks    Status Achieved  PT Long Term Goals - 03/26/21 1058       PT LONG TERM GOAL #1   Title Patient will increase BLE gross strength to 4+/5 as to improve functional strength for independent gait, increased standing tolerance and increased ADL ability.    Time 8    Period Weeks    Status Achieved    Target Date 03/26/21      PT LONG TERM GOAL #2   Title Patient will report a worst pain of 3/10 on VAS in low back  to improve tolerance with ADLs and reduced symptoms with activities.    Baseline Report no significant pain with yard work, wrist reports normal pain he has had chronically for many years but does not limit him at this time    Time 8    Period Weeks    Status Achieved      PT LONG TERM GOAL #3   Title Patient will tolerate standing/walking for at least 30 min with a maximum of 3/10 back pain to exhibit improved tolerance with ADLs such as cooking/cleaning, grocery shopping.    Baseline Patient does not have reports of back pain that is limiting him from ambulating at this time.    Time 8    Period Weeks    Status Partially Met      PT LONG TERM GOAL #4   Title Patient will improve mini best test by 3 points or more indicating decreased risk of falls.    Baseline Mini best score 24 at progress note on 10/4    Time 8    Period Weeks    Status Achieved    Target Date 05/21/21      PT LONG TERM GOAL #5   Title Patient will improve FOTO score by at least 5% to indicate improved functional mobility with ADLs.     Baseline 58.3 initial eval, FOTO 66    Time 8    Period Weeks    Status Achieved      PT LONG TERM GOAL #6   Title Patient will increase six minute walk test distance to >1300 for progression to community ambulator and improve gait ability close to age group norms.    Baseline 1160 at PN 10/4    Time 8    Period Weeks    Status On-going    Target Date 05/21/21                   Plan - 04/19/21 1110     Clinical Impression Statement Pt demonstrates excellent motivation throughout today's session. Neuromuscular re-ed continued utilizing BOSU ball. Stability exercises on 1/2 foam roll were introduced with lateral LOB during tandem stance, rightward LOB more frequent than left. Pt showed significant improvement in execution of neuromuscular re-ed exercises during the second set of each exercise. CGA was provided at all times to steady due to occasional LOB. Appropriate level of muscular fatigue was reported by patient throughout session. Pt will benefit from continued skilled therapy interventions in order to improve his balance, LE strength, decrease fall risk and improve overall function.    Personal Factors and Comorbidities Comorbidity 3+;Age    Comorbidities Parkinson's disease, DM x2 (well controlled), chronic low back pain; s/p CABG x5, CKD III    Examination-Activity Limitations Locomotion Level;Squat;Stairs;Stand;Transfers    Examination-Participation Restrictions Community Activity;Shop;Volunteer;Yard Work    Stability/Clinical Decision Making Evolving/Moderate complexity    Rehab Potential Good    PT Frequency 2x / week  PT Duration 6 weeks    PT Treatment/Interventions Cryotherapy;Electrical Stimulation;Moist Heat;Gait training;Stair training;Functional mobility training;Therapeutic activities;Therapeutic exercise;Balance training;Neuromuscular re-education;Patient/family education;Manual techniques;Passive range of motion;Energy conservation    PT Next Visit Plan  high level balance, LE muscular strength and power    PT Home Exercise Plan Provided 02/01/21    Consulted and Agree with Plan of Care Patient             Patient will benefit from skilled therapeutic intervention in order to improve the following deficits and impairments:  Decreased balance, Decreased endurance, Decreased mobility, Difficulty walking, Decreased activity tolerance, Decreased strength, Postural dysfunction, Pain  Visit Diagnosis: Abnormality of gait and mobility  Other lack of coordination  Difficulty in walking, not elsewhere classified  Unsteadiness on feet  Muscle weakness (generalized)  Other abnormalities of gait and mobility     Problem List Patient Active Problem List   Diagnosis Date Noted   Anemia, unspecified 02/07/2021   Neutropenia ([place]) 01/29/2021   Thrombocytopenia ([place]) 01/29/2021   Hyponatremia 12/22/2020   Bilateral leg weakness 12/20/2020   Prostate cancer screening 09/08/2020   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[place]) 09/06/2020   Low back pain of over 3 months duration 08/19/2019   Pulmonary hypertension ([place])    Insomnia 12/21/2018   Sleep apnea in adult 12/09/2018   Periodic limb movement disorder 12/09/2018   Lung nodule 07/09/2018   Pulmonary nodules 05/18/2018   Wears hearing aid in both ears 05/17/2018   Leg pain, bilateral 02/20/2018   Dyspnea    Effort angina ([facility])    CKD (chronic kidney disease) stage 3, GFR 30-59 ml/min ([place]) 09/21/2017   History of skin cancer in adulthood 08/14/2016   Parkinson's disease ([place]) 08/09/2016   Bilateral carotid artery stenosis 04/02/2015   Vertigo, peripheral 10/17/2014   Benign prostatic hypertrophy with urinary frequency 01/31/2014   Encounter for Medicare annual wellness exam 07/02/2013   Obesity 04/03/2013   Other malaise and fatigue 09/21/2012   Hyperlipidemia    Hypertension    3-vessel coronary artery disease    S/P CABG x 5    Well controlled type 2  diabetes mellitus with nephropathy ([place]) 05/21/2011   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 MRN: [id number] Date of Birth: December 28, 1945

## 2021-04-23 ENCOUNTER — Ambulatory Visit: Payer: Medicare Other | Admitting: Physical Therapy

## 2021-04-26 ENCOUNTER — Other Ambulatory Visit: Payer: Self-pay

## 2021-04-26 ENCOUNTER — Ambulatory Visit: Payer: Medicare Other | Admitting: Physical Therapy

## 2021-04-26 DIAGNOSIS — R262 Difficulty in walking, not elsewhere classified: Secondary | ICD-10-CM | POA: Diagnosis not present

## 2021-04-26 DIAGNOSIS — R269 Unspecified abnormalities of gait and mobility: Secondary | ICD-10-CM | POA: Diagnosis not present

## 2021-04-26 DIAGNOSIS — R2681 Unsteadiness on feet: Secondary | ICD-10-CM

## 2021-04-26 DIAGNOSIS — R278 Other lack of coordination: Secondary | ICD-10-CM | POA: Diagnosis not present

## 2021-04-26 DIAGNOSIS — M6281 Muscle weakness (generalized): Secondary | ICD-10-CM | POA: Diagnosis not present

## 2021-04-26 DIAGNOSIS — R2689 Other abnormalities of gait and mobility: Secondary | ICD-10-CM | POA: Diagnosis not present

## 2021-04-26 NOTE — Therapy (Signed)
[place] MAIN [facility] SERVICES [address] [place], [place], [address] Phone: [phone]   Fax:  [phone]  Physical Therapy Treatment/Physical Therapy Progress Note/discharge summary note   Dates of reporting period  03/12/21   to   04/26/21   Patient Details  Name: Johnathan Arnold. MRN: [id number] Date of Birth: 01-27-1946 Referring Provider (PT): Dr. Carles Collet   Encounter Date: 04/26/2021   PT End of Session - 04/26/21 1124     Visit Number 20    Number of Visits 24    Date for PT Re-Evaluation 05/21/21    Authorization Type Medicare; start of care 0/35/00- 93/81/82 New cert 99/37/16-96/78/93    Progress Note Due on Visit 20    PT Start Time 1017    PT Stop Time 1102    PT Time Calculation (min) 45 min    Equipment Utilized During Treatment Gait belt    Activity Tolerance Patient tolerated treatment well    Behavior During Therapy [facility] for tasks assessed/performed             Past Medical History:  Diagnosis Date   3-vessel coronary artery disease    s/p  5 vessel CABG   Diabetes mellitus without complication ([place])    History of cardiac catheterization 2011   [place]   Hyperlipidemia    Hypertension    Hypertriglyceridemia    Parkinson's disease ([place])    Pneumonia 12/28/2020   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 LIMA to LAD, SVG to D1, OM1 and RPDA   CORONARY ARTERY BYPASS GRAFT  03/1998   5 vessel, [facility]   RIGHT HEART CATH N/A 04/04/2019   Procedure: RIGHT HEART CATH;  Surgeon: Wellington Hampshire, MD;  Location: [place] [facility];  Service: Cardiovascular;  Laterality: N/A;   RIGHT/LEFT HEART CATH AND CORONARY ANGIOGRAPHY N/A 02/01/2018   Procedure: RIGHT/LEFT HEART CATH AND CORONARY ANGIOGRAPHY;  Surgeon: Wellington Hampshire, MD;  Location: [place] [facility];  Service: Cardiovascular;  Laterality:  N/A;    There were no vitals filed for this visit.   Subjective Assessment - 04/26/21 1122     Subjective Pt reports some low back pain today and he had some s/s of vertigo earlier in the week. However, vertigo has resolved since.  Patient having some continual low back pain, however, this is chronic in nature he states it flares up from time to time.  Patient reports he feels he is gotten a lot stronger and his balance is improved significantly since start of therapy and is comfortable with his home exercise program and with discharge today as he has met or partially met all of his therapy goals.    Patient is accompained by: Family member    Pertinent History 75 yo Male presents to therapy with history of Parkinson's Disease. He reports in June 2022 he got out of bed one morning and his legs just quit working. He was unable to get up and move around. He reports progressive weakness and not feeling well for about 4-5 weeks. He reports his legs feel weak and he is unable to walk as far. He reports soreness in calf. Patient presents to therapy without AD. he reports he used a cane for a few weeks but doesn't use it anymore. He reports falling a few weeks ago while [place]  [place]. He reports he lost his balance in the sand. He reports 6 weeks ago he was on the portch swing and when he stood up he tried to get his balance and must have passed out. He denies any significant injuries with each fall. He did see his neurologist last week who increased his carbiopa/levadopa. He reports he has been doing okay with increased medication; He reports having pneumonia but wasn't aware and neurologist believes that exacerbated his Parkinson's Disease. He is doing [employer] which he does 3 days a week; He does have a resting tremor in RUE;  He also reports difficulty writing; He reports difficulty buttoning a button but other dressing is not difficult. In addition he has a history of chronic low back pain which he  states has been increasing in center of low back. He reports his back pain worse with standing and activity and alleviates with sitting;    Limitations Standing;Walking    How long can you sit comfortably? no difficulty; sitting alleviates pain;    How long can you stand comfortably? 10-15 min    How long can you walk comfortably? 5-10 min, worse with carrying something and with walking;    Diagnostic tests MRI in June 2022 Multilevel degenerative changes without stenosis;    Patient Stated Goals "Improve back pain, increase strength in legs and arms"                [facility] PT Assessment - 04/26/21 0001       Standardized Balance Assessment   Standardized Balance Assessment Mini-BESTest (P)       Mini-BESTest   Sit To Stand Normal: Comes to stand without use of hands and stabilizes independently. (P)     Rise to Toes Normal: Stable for 3 s with maximum height. (P)     Stand on one leg (left) Moderate: < 20 s (P)     Stand on one leg (right) Moderate: < 20 s (P)     Stand on one leg - lowest score 1 (P)     Compensatory Stepping Correction - Forward Normal: Recovers independently with a single, large step (second realignement is allowed). (P)     Compensatory Stepping Correction - Backward Normal: Recovers independently with a single, large step (P)     Compensatory Stepping Correction - Left Lateral Normal: Recovers independently with 1 step (crossover or lateral OK) (P)     Compensatory Stepping Correction - Right Lateral Normal: Recovers independently with 1 step (crossover or lateral OK) (P)     Stepping Corredtion Lateral - lowest score 2 (P)     Stance - Feet together, eyes open, firm surface  Normal: 30s (P)     Stance - Feet together, eyes closed, foam surface  Normal: 30s (P)     Incline - Eyes Closed Normal: Stands independently 30s and aligns with gravity (P)     Change in Gait Speed Normal: Significantly changes walkling speed without imbalance (P)     Walk with head  turns - Horizontal Normal: performs head turns with no change in gait speed and good balance (P)     Walk with pivot turns Normal: Turns with feet close FAST (< 3 steps) with good balance. (P)     Step over obstacles Normal: Able to step over box with minimal change of gait speed and with good balance. (P)     Timed UP & GO with Dual Task Normal: No noticeable change in sitting, standing  or walking while backward counting when compared to TUG without (P)     Mini-BEST total score 27 (P)                                     PT Education - 04/26/21 1123     Education Details Patient instructed in progression and exercises moving forward and also instructed if he has any balance issues in the future he may seek additional treatment at this clinic.  Patient also educated regarding potential for Parkinson's exercise class and interest if we offer Reclast through our clinic in the future    Person(s) Educated Patient    Methods Explanation    Comprehension Verbalized understanding              PT Short Term Goals - 04/26/21 1024       PT SHORT TERM GOAL #1   Title Patient will be adherent to HEP at least 3x a week to improve functional strength and balance for better safety at home.    Baseline Pt performing his HEP based on subjective reports (10/17/2019)    Time 4    Period Weeks    Status Partially Met    Target Date 02/26/21      PT SHORT TERM GOAL #2   Title Patient will improve BUE gross strength to 4/5 to improve functional strength for lifting/carrying groceries    Baseline 5/5 strength with all major planes of motion, no reports of arm pain, some tremor with max effort but no limit on funciton    Time 4    Period Weeks    Status Achieved               PT Long Term Goals - 03/26/21 1058       PT LONG TERM GOAL #1   Title Patient will increase BLE gross strength to 4+/5 as to improve functional strength for independent gait, increased  standing tolerance and increased ADL ability.    Time 8    Period Weeks    Status Achieved    Target Date 03/26/21      PT LONG TERM GOAL #2   Title Patient will report a worst pain of 3/10 on VAS in low back  to improve tolerance with ADLs and reduced symptoms with activities.    Baseline Report no significant pain with yard work, wrist reports normal pain he has had chronically for many years but does not limit him at this time    Time 8    Period Weeks    Status Achieved      PT LONG TERM GOAL #3   Title Patient will tolerate standing/walking for at least 30 min with a maximum of 3/10 back pain to exhibit improved tolerance with ADLs such as cooking/cleaning, grocery shopping.    Baseline Patient does not have reports of back pain that is limiting him from ambulating at this time.    Time 8    Period Weeks    Status Partially Met      PT LONG TERM GOAL #4   Title Patient will improve mini best test by 3 points or more indicating decreased risk of falls.    Baseline Mini best score 24 at progress note on 10/4    Time 8    Period Weeks    Status Achieved    Target Date 05/21/21  PT LONG TERM GOAL #5   Title Patient will improve FOTO score by at least 5% to indicate improved functional mobility with ADLs.    Baseline 58.3 initial eval, FOTO 66    Time 8    Period Weeks    Status Achieved      PT LONG TERM GOAL #6   Title Patient will increase six minute walk test distance to >1300 for progression to community ambulator and improve gait ability close to age group norms.    Baseline 1160 at PN 10/4    Time 8    Period Weeks    Status On-going    Target Date 05/21/21                   Plan - 04/26/21 1125     Clinical Impression Statement Patient presents today for a progress note.  Patient continues to demonstrate excellent motivation for completion of physical therapy exercises.  Patient able to progress with 6-minute walk test despite some complaints of low  back pain at this time.  Patient did not meet the goal for 6-minute walk test but is above his baseline significantly and may have been able to demonstrate improved distance and speed if not for his low back pain today.  Patient did demonstrate improved ability to complete mini best test and demonstrated significantly improved dynamic balance tasks including walking with improved stability as well as improved reactive balance with testing procedures.  Patient was educated regarding home exercise program and importance of continuation of this program he is comfortable with all some exercises and completing them regularly.  Patient was instructed if he has any issues with balance in the future he may return to our clinic for further treatment but was also encouraged to continue with his balance practice at home.  Patient will be discharged from this physical therapy about at this time as he has met or partially met all of his goals and his balance is improved and is no longer at a risk for falls based on testing procedures completed today and in previous sessions.    Personal Factors and Comorbidities Comorbidity 3+;Age    Comorbidities Parkinson's disease, DM x2 (well controlled), chronic low back pain; s/p CABG x5, CKD III    Examination-Activity Limitations Locomotion Level;Squat;Stairs;Stand;Transfers    Examination-Participation Restrictions Community Activity;Shop;Volunteer;Yard Work    Stability/Clinical Decision Making Evolving/Moderate complexity    Rehab Potential Good    PT Frequency 2x / week    PT Duration 6 weeks    PT Treatment/Interventions Cryotherapy;Electrical Stimulation;Moist Heat;Gait training;Stair training;Functional mobility training;Therapeutic activities;Therapeutic exercise;Balance training;Neuromuscular re-education;Patient/family education;Manual techniques;Passive range of motion;Energy conservation    PT Next Visit Plan Discharge at this time    Consulted and Agree with Plan  of Care Patient             Patient will benefit from skilled therapeutic intervention in order to improve the following deficits and impairments:  Decreased balance, Decreased endurance, Decreased mobility, Difficulty walking, Decreased activity tolerance, Decreased strength, Postural dysfunction, Pain  Visit Diagnosis: Abnormality of gait and mobility  Difficulty in walking, not elsewhere classified  Unsteadiness on feet     Problem List Patient Active Problem List   Diagnosis Date Noted   Anemia, unspecified 02/07/2021   Neutropenia ([place]) 01/29/2021   Thrombocytopenia ([place]) 01/29/2021   Hyponatremia 12/22/2020   Bilateral leg weakness 12/20/2020   Prostate cancer screening 09/08/2020   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[place]) 09/06/2020  Low back pain of over 3 months duration 08/19/2019   Pulmonary hypertension ([place])    Insomnia 12/21/2018   Sleep apnea in adult 12/09/2018   Periodic limb movement disorder 12/09/2018   Lung nodule 07/09/2018   Pulmonary nodules 05/18/2018   Wears hearing aid in both ears 05/17/2018   Leg pain, bilateral 02/20/2018   Dyspnea    Effort angina ([facility])    CKD (chronic kidney disease) stage 3, GFR 30-59 ml/min ([place]) 09/21/2017   History of skin cancer in adulthood 08/14/2016   Parkinson's disease ([place]) 08/09/2016   Bilateral carotid artery stenosis 04/02/2015   Vertigo, peripheral 10/17/2014   Benign prostatic hypertrophy with urinary frequency 01/31/2014   Encounter for Medicare annual wellness exam 07/02/2013   Obesity 04/03/2013   Other malaise and fatigue 09/21/2012   Hyperlipidemia    Hypertension    3-vessel coronary artery disease    S/P CABG x 5    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 [place], [place], [address] Phone: [phone]   Fax:  [phone]  Name: Johnathan Arnold. MRN: [id number] Date of Birth: 1945-11-07

## 2021-04-30 ENCOUNTER — Ambulatory Visit: Payer: Medicare Other | Admitting: Physical Therapy

## 2021-05-05 ENCOUNTER — Other Ambulatory Visit: Payer: Self-pay | Admitting: Internal Medicine

## 2021-05-06 DIAGNOSIS — G20 Parkinson's disease: Secondary | ICD-10-CM | POA: Diagnosis not present

## 2021-05-06 DIAGNOSIS — E860 Dehydration: Secondary | ICD-10-CM | POA: Diagnosis not present

## 2021-05-06 DIAGNOSIS — S0990XA Unspecified injury of head, initial encounter: Secondary | ICD-10-CM | POA: Diagnosis not present

## 2021-05-06 DIAGNOSIS — R55 Syncope and collapse: Secondary | ICD-10-CM | POA: Diagnosis not present

## 2021-05-07 ENCOUNTER — Observation Stay: Payer: Medicare Other

## 2021-05-07 ENCOUNTER — Observation Stay
Admission: EM | Admit: 2021-05-07 | Discharge: 2021-05-09 | Disposition: A | Discharge status: Home / Self Care | Payer: Medicare Other | Attending: Internal Medicine | Admitting: Internal Medicine

## 2021-05-07 ENCOUNTER — Emergency Department: Payer: Medicare Other

## 2021-05-07 ENCOUNTER — Other Ambulatory Visit: Payer: Self-pay

## 2021-05-07 ENCOUNTER — Ambulatory Visit: Payer: Medicare Other | Admitting: Neurology

## 2021-05-07 ENCOUNTER — Ambulatory Visit: Payer: Medicare Other | Admitting: Physical Therapy

## 2021-05-07 DIAGNOSIS — S0990XA Unspecified injury of head, initial encounter: Principal | ICD-10-CM | POA: Insufficient documentation

## 2021-05-07 DIAGNOSIS — Z20822 Contact with and (suspected) exposure to covid-19: Secondary | ICD-10-CM | POA: Insufficient documentation

## 2021-05-07 DIAGNOSIS — R42 Dizziness and giddiness: Secondary | ICD-10-CM | POA: Diagnosis not present

## 2021-05-07 DIAGNOSIS — G20 Parkinson's disease: Secondary | ICD-10-CM | POA: Diagnosis not present

## 2021-05-07 DIAGNOSIS — I700 Atherosclerosis of aorta: Secondary | ICD-10-CM | POA: Diagnosis not present

## 2021-05-07 DIAGNOSIS — Z79899 Other long term (current) drug therapy: Secondary | ICD-10-CM | POA: Diagnosis not present

## 2021-05-07 DIAGNOSIS — Z951 Presence of aortocoronary bypass graft: Secondary | ICD-10-CM | POA: Diagnosis not present

## 2021-05-07 DIAGNOSIS — R55 Syncope and collapse: Secondary | ICD-10-CM | POA: Diagnosis not present

## 2021-05-07 DIAGNOSIS — E860 Dehydration: Secondary | ICD-10-CM | POA: Insufficient documentation

## 2021-05-07 DIAGNOSIS — R531 Weakness: Secondary | ICD-10-CM | POA: Diagnosis not present

## 2021-05-07 DIAGNOSIS — R918 Other nonspecific abnormal finding of lung field: Secondary | ICD-10-CM | POA: Diagnosis not present

## 2021-05-07 DIAGNOSIS — I6782 Cerebral ischemia: Secondary | ICD-10-CM | POA: Insufficient documentation

## 2021-05-07 DIAGNOSIS — E119 Type 2 diabetes mellitus without complications: Secondary | ICD-10-CM | POA: Insufficient documentation

## 2021-05-07 DIAGNOSIS — S299XXA Unspecified injury of thorax, initial encounter: Secondary | ICD-10-CM | POA: Diagnosis not present

## 2021-05-07 DIAGNOSIS — I6523 Occlusion and stenosis of bilateral carotid arteries: Secondary | ICD-10-CM | POA: Diagnosis not present

## 2021-05-07 DIAGNOSIS — W19XXXA Unspecified fall, initial encounter: Secondary | ICD-10-CM | POA: Diagnosis not present

## 2021-05-07 DIAGNOSIS — Z85828 Personal history of other malignant neoplasm of skin: Secondary | ICD-10-CM | POA: Insufficient documentation

## 2021-05-07 DIAGNOSIS — Z7982 Long term (current) use of aspirin: Secondary | ICD-10-CM | POA: Insufficient documentation

## 2021-05-07 DIAGNOSIS — I10 Essential (primary) hypertension: Secondary | ICD-10-CM

## 2021-05-07 DIAGNOSIS — M47814 Spondylosis without myelopathy or radiculopathy, thoracic region: Secondary | ICD-10-CM | POA: Diagnosis not present

## 2021-05-07 DIAGNOSIS — I2510 Atherosclerotic heart disease of native coronary artery without angina pectoris: Secondary | ICD-10-CM | POA: Diagnosis not present

## 2021-05-07 DIAGNOSIS — R911 Solitary pulmonary nodule: Secondary | ICD-10-CM | POA: Diagnosis not present

## 2021-05-07 DIAGNOSIS — R001 Bradycardia, unspecified: Secondary | ICD-10-CM

## 2021-05-07 LAB — TSH: TSH: 2.674 u[IU]/mL (ref 0.350–4.500)

## 2021-05-07 LAB — RESP PANEL BY RT-PCR (FLU A&B, COVID) ARPGX2
Influenza A by PCR: NEGATIVE
Influenza B by PCR: NEGATIVE
SARS Coronavirus 2 by RT PCR: NEGATIVE

## 2021-05-07 LAB — URINALYSIS, ROUTINE W REFLEX MICROSCOPIC
Bilirubin Urine: NEGATIVE
Glucose, UA: NEGATIVE mg/dL
Hgb urine dipstick: NEGATIVE
Leukocytes,Ua: NEGATIVE
Nitrite: NEGATIVE
Protein, ur: 100 mg/dL — AB
Specific Gravity, Urine: 1.015 (ref 1.005–1.030)
pH: 7 (ref 5.0–8.0)

## 2021-05-07 LAB — CBC WITH DIFFERENTIAL/PLATELET
Abs Immature Granulocytes: 0.02 10*3/uL (ref 0.00–0.07)
Basophils Absolute: 0 10*3/uL (ref 0.0–0.1)
Basophils Relative: 0 %
Eosinophils Absolute: 0 10*3/uL (ref 0.0–0.5)
Eosinophils Relative: 1 %
HCT: 33.7 % — ABNORMAL LOW (ref 39.0–52.0)
Hemoglobin: 11.8 g/dL — ABNORMAL LOW (ref 13.0–17.0)
Immature Granulocytes: 1 %
Lymphocytes Relative: 36 %
Lymphs Abs: 1.5 10*3/uL (ref 0.7–4.0)
MCH: 34.9 pg — ABNORMAL HIGH (ref 26.0–34.0)
MCHC: 35 g/dL (ref 30.0–36.0)
MCV: 99.7 fL (ref 80.0–100.0)
Monocytes Absolute: 0.2 10*3/uL (ref 0.1–1.0)
Monocytes Relative: 4 %
Neutro Abs: 2.6 10*3/uL (ref 1.7–7.7)
Neutrophils Relative %: 58 %
Platelets: 133 10*3/uL — ABNORMAL LOW (ref 150–400)
RBC: 3.38 MIL/uL — ABNORMAL LOW (ref 4.22–5.81)
RDW: 12.4 % (ref 11.5–15.5)
WBC: 4.3 10*3/uL (ref 4.0–10.5)
nRBC: 0 % (ref 0.0–0.2)

## 2021-05-07 LAB — COMPREHENSIVE METABOLIC PANEL
ALT: 8 U/L (ref 0–44)
AST: 24 U/L (ref 15–41)
Albumin: 4.1 g/dL (ref 3.5–5.0)
Alkaline Phosphatase: 47 U/L (ref 38–126)
Anion gap: 7 (ref 5–15)
BUN: 18 mg/dL (ref 8–23)
CO2: 28 mmol/L (ref 22–32)
Calcium: 9.2 mg/dL (ref 8.9–10.3)
Chloride: 100 mmol/L (ref 98–111)
Creatinine, Ser: 1.65 mg/dL — ABNORMAL HIGH (ref 0.61–1.24)
GFR, Estimated: 43 mL/min — ABNORMAL LOW (ref >60)
Glucose, Bld: 121 mg/dL — ABNORMAL HIGH (ref 70–99)
Potassium: 4.3 mmol/L (ref 3.5–5.1)
Sodium: 135 mmol/L (ref 135–145)
Total Bilirubin: 1.2 mg/dL (ref 0.3–1.2)
Total Protein: 6.6 g/dL (ref 6.5–8.1)

## 2021-05-07 LAB — LACTIC ACID, PLASMA: Lactic Acid, Venous: 1.9 mmol/L (ref 0.5–1.9)

## 2021-05-07 LAB — HEMOGLOBIN A1C
Hgb A1c MFr Bld: 5.4 % (ref 4.8–5.6)
Mean Plasma Glucose: 108 mg/dL

## 2021-05-07 LAB — URINALYSIS, MICROSCOPIC (REFLEX)
Bacteria, UA: NONE SEEN
Squamous Epithelial / HPF: NONE SEEN (ref 0–5)

## 2021-05-07 LAB — TROPONIN I (HIGH SENSITIVITY)
Troponin I (High Sensitivity): 7 ng/L (ref <18)
Troponin I (High Sensitivity): 7 ng/L (ref <18)

## 2021-05-07 LAB — PROCALCITONIN: Procalcitonin: <0.1 ng/mL

## 2021-05-07 MED ORDER — ACETAMINOPHEN 650 MG RE SUPP: 650.0000 mg | Freq: Four times a day (QID) | RECTAL | Status: DC | PRN

## 2021-05-07 MED ORDER — TAMSULOSIN HCL 0.4 MG PO CAPS
0.4000 mg | ORAL_CAPSULE | Freq: Every day | ORAL | Status: DC
  Administered 2021-05-07 – 2021-05-09 (×3): 0.4 mg via ORAL
  Filled 2021-05-07 (×2): qty 1

## 2021-05-07 MED ORDER — SODIUM CHLORIDE 0.9 % IV BOLUS
500.0000 mL | Freq: Once | INTRAVENOUS | Status: AC
  Administered 2021-05-07: 500 mL via INTRAVENOUS

## 2021-05-07 MED ORDER — LOSARTAN POTASSIUM 50 MG PO TABS
25.0000 mg | ORAL_TABLET | Freq: Once | ORAL | Status: AC
  Administered 2021-05-07: 25 mg via ORAL

## 2021-05-07 MED ORDER — NITROGLYCERIN 0.4 MG SL SUBL: 0.4000 mg | SUBLINGUAL_TABLET | SUBLINGUAL | Status: DC | PRN

## 2021-05-07 MED ORDER — ISOSORBIDE MONONITRATE ER 60 MG PO TB24
60.0000 mg | ORAL_TABLET | Freq: Every day | ORAL | Status: DC
  Administered 2021-05-07: 60 mg via ORAL

## 2021-05-07 MED ORDER — ENOXAPARIN SODIUM 40 MG/0.4ML IJ SOSY
40.0000 mg | PREFILLED_SYRINGE | INTRAMUSCULAR | Status: DC
  Administered 2021-05-07 – 2021-05-08 (×2): 40 mg via SUBCUTANEOUS
  Filled 2021-05-07: qty 0.4

## 2021-05-07 MED ORDER — RANOLAZINE ER 500 MG PO TB12
1000.0000 mg | ORAL_TABLET | Freq: Two times a day (BID) | ORAL | Status: DC
  Administered 2021-05-07: 1000 mg via ORAL
  Filled 2021-05-07 (×2): qty 2

## 2021-05-07 MED ORDER — RANOLAZINE ER 500 MG PO TB12
1000.0000 mg | ORAL_TABLET | Freq: Two times a day (BID) | ORAL | Status: DC
  Administered 2021-05-08 – 2021-05-09 (×3): 1000 mg via ORAL
  Filled 2021-05-07 (×4): qty 2

## 2021-05-07 MED ORDER — ISOSORBIDE MONONITRATE ER 30 MG PO TB24
60.0000 mg | ORAL_TABLET | Freq: Every day | ORAL | Status: DC
  Administered 2021-05-08 – 2021-05-09 (×2): 60 mg via ORAL
  Filled 2021-05-07 (×2): qty 2

## 2021-05-07 MED ORDER — CARBIDOPA-LEVODOPA 25-100 MG PO TABS: 2.0000 | ORAL_TABLET | Freq: Three times a day (TID) | ORAL | Status: DC

## 2021-05-07 MED ORDER — CARBIDOPA-LEVODOPA ER 50-200 MG PO TBCR
1.0000 | EXTENDED_RELEASE_TABLET | Freq: Every day | ORAL | Status: DC
  Administered 2021-05-07 – 2021-05-08 (×2): 1 via ORAL
  Filled 2021-05-07 (×3): qty 1

## 2021-05-07 MED ORDER — LOSARTAN POTASSIUM 25 MG PO TABS
25.0000 mg | ORAL_TABLET | Freq: Every day | ORAL | Status: DC
  Administered 2021-05-08 – 2021-05-09 (×2): 25 mg via ORAL
  Filled 2021-05-07 (×2): qty 1

## 2021-05-07 MED ORDER — ASPIRIN EC 81 MG PO TBEC
81.0000 mg | DELAYED_RELEASE_TABLET | Freq: Every day | ORAL | Status: DC
  Administered 2021-05-08 – 2021-05-09 (×2): 81 mg via ORAL
  Filled 2021-05-07 (×2): qty 1

## 2021-05-07 MED ORDER — HYDRALAZINE HCL 10 MG PO TABS
10.0000 mg | ORAL_TABLET | Freq: Four times a day (QID) | ORAL | Status: DC
Start: 2021-05-07 — End: 2021-05-08
  Administered 2021-05-07 – 2021-05-08 (×4): 10 mg via ORAL
  Filled 2021-05-07 (×6): qty 1

## 2021-05-07 MED ORDER — CARBIDOPA-LEVODOPA 25-100 MG PO TABS
2.0000 | ORAL_TABLET | Freq: Three times a day (TID) | ORAL | Status: DC
  Administered 2021-05-07 – 2021-05-09 (×7): 2 via ORAL
  Filled 2021-05-07 (×8): qty 2

## 2021-05-07 MED ORDER — HYDRALAZINE HCL 20 MG/ML IJ SOLN
5.0000 mg | Freq: Once | INTRAMUSCULAR | Status: AC
  Administered 2021-05-07: 5 mg via INTRAVENOUS

## 2021-05-07 MED ORDER — ACETAMINOPHEN 325 MG PO TABS: 650.0000 mg | ORAL_TABLET | Freq: Four times a day (QID) | ORAL | Status: DC | PRN

## 2021-05-07 MED ORDER — POLYETHYLENE GLYCOL 3350 17 G PO PACK
17.0000 g | PACK | Freq: Every day | ORAL | Status: DC
  Administered 2021-05-07 – 2021-05-08 (×2): 17 g via ORAL
  Filled 2021-05-07: qty 1

## 2021-05-07 MED ORDER — BISACODYL 5 MG PO TBEC: 5.0000 mg | DELAYED_RELEASE_TABLET | Freq: Every day | ORAL | Status: DC | PRN

## 2021-05-07 MED ORDER — CLONAZEPAM 0.25 MG PO TBDP
0.2500 mg | ORAL_TABLET | Freq: Once | ORAL | Status: AC | PRN
  Administered 2021-05-08: 0.25 mg via ORAL
  Filled 2021-05-07 (×2): qty 1

## 2021-05-07 MED ORDER — ROSUVASTATIN CALCIUM 10 MG PO TABS
10.0000 mg | ORAL_TABLET | Freq: Every day | ORAL | Status: DC
  Administered 2021-05-07 – 2021-05-08 (×2): 10 mg via ORAL
  Filled 2021-05-07 (×2): qty 1

## 2021-05-07 NOTE — ED Provider Notes (Addendum)
[facility] Emergency Department Provider Note   ____________________________________________   Event Date/Time   First MD Initiated Contact with Patient 05/07/21 0407     (approximate)  I have reviewed the triage vital signs and the nursing notes.   HISTORY  Chief Complaint Fall    HPI Johnathan Arnold. is a 75 y.o. male brought to the ED via EMS from home status post fall with head injury.  Patient with a history of Parkinson's disease, states he was getting out of bed this morning and sitting on the edge of his bed when he slid and struck the right side of his head on the dresser.  Takes a baby aspirin daily.  Denies LOC.  Denies vision changes, neck pain, chest pain, shortness of breath, abdominal pain, nausea, vomiting or dizziness     Past Medical History:  Diagnosis Date   3-vessel coronary artery disease    s/p  5 vessel CABG   Diabetes mellitus without complication ([place])    History of cardiac catheterization 2011   [facility]   Hyperlipidemia    Hypertension    Hypertriglyceridemia    Parkinson's disease ([place])    Pneumonia 12/28/2020   S/P CABG x 5 11-99   Vertigo     Patient Active Problem List   Diagnosis Date Noted   Anemia, unspecified 02/07/2021   Neutropenia ([place]) 01/29/2021   Thrombocytopenia ([place]) 01/29/2021   Hyponatremia 12/22/2020   Bilateral leg weakness 12/20/2020   Prostate cancer screening 09/08/2020   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[place]) 09/06/2020   Low back pain of over 3 months duration 08/19/2019   Pulmonary hypertension ([place])    Insomnia 12/21/2018   Sleep apnea in adult 12/09/2018   Periodic limb movement disorder 12/09/2018   Lung nodule 07/09/2018   Pulmonary nodules 05/18/2018   Wears hearing aid in both ears 05/17/2018   Leg pain, bilateral 02/20/2018   Dyspnea    Effort angina (HCC)    CKD (chronic kidney disease) stage 3, GFR 30-59 ml/min ([place]) 09/21/2017   History of skin  cancer in adulthood 08/14/2016   Parkinson's disease ([name]) 08/09/2016   Bilateral carotid artery stenosis 04/02/2015   Vertigo, peripheral 10/17/2014   Benign prostatic hypertrophy with urinary frequency 01/31/2014   Encounter for Medicare annual wellness exam 07/02/2013   Obesity 04/03/2013   Other malaise and fatigue 09/21/2012   Hyperlipidemia    Hypertension    3-vessel coronary artery disease    S/P CABG x 5    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 LIMA to LAD, SVG to D1, OM1 and RPDA   CORONARY ARTERY BYPASS GRAFT  03/1998   5 vessel, [facility]   RIGHT HEART CATH N/A 04/04/2019   Procedure: RIGHT HEART CATH;  Surgeon: Wellington Hampshire, MD;  Location: [place] [facility];  Service: Cardiovascular;  Laterality: N/A;   RIGHT/LEFT HEART CATH AND CORONARY ANGIOGRAPHY N/A 02/01/2018   Procedure: RIGHT/LEFT HEART CATH AND CORONARY ANGIOGRAPHY;  Surgeon: Wellington Hampshire, MD;  Location: [place] [facility];  Service: Cardiovascular;  Laterality: N/A;    Prior to Admission medications   Medication Sig Start Date End Date Taking? Authorizing Provider  aspirin EC 81 MG tablet Take 1 tablet (81 mg total) by  mouth at bedtime. 09/28/20   Loel Dubonnet, NP  carbidopa-levodopa (SINEMET CR) 50-200 MG tablet Take 1 tablet by mouth at bedtime. 10/30/20   Tat, Eustace Quail, DO  carbidopa-levodopa (SINEMET IR) 25-100 MG tablet Take 2 tablets by mouth 3 (three) times daily. 2 at 8am, 2 at 11am, 2 at 2 pm, 1 at 5pm 01/24/21   Tat, Wells Guiles S, DO  carvedilol (COREG) 3.125 MG tablet Take 1 tablet (3.125 mg total) by mouth 2 (two) times daily. 04/08/21 07/07/21  Wellington Hampshire, MD  clonazePAM (KLONOPIN) 0.5 MG tablet TAKE 1 AND 1/2 TABLET BY MOUTH EVERY NIGHT AT BEDTIME  03/27/21   Tat, Eustace Quail, DO  diazepam (VALIUM) 5 MG tablet Take 1 tablet (5 mg total) by mouth every 8 (eight) hours as needed for anxiety (or vertigo). 04/05/21 04/05/22  [name], MD  furosemide (LASIX) 20 MG tablet TAKE ONE TABLET BY MOUTH DAILY 03/08/21   Wellington Hampshire, MD  isosorbide mononitrate (IMDUR) 60 MG 24 hr tablet TAKE ONE TABLET BY MOUTH DAILY 03/25/21   Wellington Hampshire, MD  losartan (COZAAR) 25 MG tablet Take 1 tablet (25 mg total) by mouth daily. 12/20/20   [name], MD  Multiple Vitamin (MULTIVITAMIN) tablet Take 1 tablet by mouth daily.    [provider]  nitroGLYCERIN (NITROSTAT) 0.4 MG SL tablet Place 1 tablet (0.4 mg total) under the tongue every 5 (five) minutes as needed for chest pain. 09/28/20   Loel Dubonnet, NP  omeprazole (PRILOSEC) 20 MG capsule TAKE ONE CAPSULE BY MOUTH EVERY MORNING 05/05/21   [name], MD  polyethylene glycol (MIRALAX / GLYCOLAX) packet Take 17 g by mouth daily.     [provider]  ranolazine (RANEXA) 1000 MG SR tablet Take 1 tablet (1,000 mg total) by mouth 2 (two) times daily. 02/21/21   Wellington Hampshire, MD  rosuvastatin (CRESTOR) 10 MG tablet Take 1 tablet (10 mg total) by mouth daily. 05/22/20 04/29/22  Wellington Hampshire, MD  tamsulosin (FLOMAX) 0.4 MG CAPS capsule TAKE ONE CAPSULE BY MOUTH DAILY 04/30/20   Hollice Espy, MD    Allergies Patient has no known allergies.  Family History  Problem Relation Age of Onset   Heart attack Mother 53   Hypertension Mother    Heart attack Father 73   Heart disease Father    Heart disease Brother    Healthy Son     Social History Social History   Tobacco Use   Smoking status: Never    Passive exposure: Yes   Smokeless tobacco: Never  Vaping Use   Vaping Use: Never used  Substance Use Topics   Alcohol use: Yes    Comment: occasional beer   Drug use: No    Review of Systems  Constitutional: No fever/chills Eyes: No visual  changes. ENT: No sore throat. Cardiovascular: Denies chest pain. Respiratory: Denies shortness of breath. Gastrointestinal: No abdominal pain.  No nausea, no vomiting.  No diarrhea.  No constipation. Genitourinary: Negative for dysuria. Musculoskeletal: Negative for back pain. Skin: Negative for rash. Neurological: Positive for minor head injury.  Negative for headaches, focal weakness or numbness.   ____________________________________________   PHYSICAL EXAM:  VITAL SIGNS: ED Triage Vitals  Enc Vitals Group     BP      Pulse      Resp      Temp      Temp src      SpO2  Weight      Height      Head Circumference      Peak Flow      Pain Score      Pain Loc      Pain Edu?      Excl. in [place]?     Constitutional: Alert and oriented.  Elderly appearing and in no acute distress. Eyes: Conjunctivae are normal. PERRL. EOMI. Head: Atraumatic.  No lacerations, abrasions, hematomas. Nose: Atraumatic. Mouth/Throat: Mucous membranes are moist.  No dental malocclusion.  Neck: No stridor.  No cervical spine tenderness to palpation. Cardiovascular: Bradycardic rate, regular rhythm. Grossly normal heart sounds.  Good peripheral circulation. Respiratory: Normal respiratory effort.  No retractions. Lungs CTAB. Gastrointestinal: Soft and nontender to light or deep palpation. No distention. No abdominal bruits. No CVA tenderness. Musculoskeletal: No lower extremity tenderness nor edema.  No joint effusions. Neurologic: Baseline essential tremors.  Alert and oriented x3.  CN II to XII mostly intact.  Normal speech and language. No gross focal neurologic deficits are appreciated. MAEx4. Skin:  Skin is warm, dry and intact. No rash noted. Psychiatric: Mood and affect are normal. Speech and behavior are normal.  ____________________________________________   LABS (all labs ordered are listed, but only abnormal results are displayed)  Labs Reviewed  CBC WITH DIFFERENTIAL/PLATELET -  Abnormal; Notable for the following components:      Result Value   RBC 3.38 (*)    Hemoglobin 11.8 (*)    HCT 33.7 (*)    MCH 34.9 (*)    Platelets 133 (*)    All other components within normal limits  COMPREHENSIVE METABOLIC PANEL - Abnormal; Notable for the following components:   Glucose, Bld 121 (*)    Creatinine, Ser 1.65 (*)    GFR, Estimated 43 (*)    All other components within normal limits  URINALYSIS, ROUTINE W REFLEX MICROSCOPIC - Abnormal; Notable for the following components:   Ketones, ur TRACE (*)    Protein, ur 100 (*)    All other components within normal limits  RESP PANEL BY RT-PCR (FLU A&B, COVID) ARPGX2  URINE CULTURE  CULTURE, BLOOD (ROUTINE X 2)  CULTURE, BLOOD (ROUTINE X 2)  LACTIC ACID, PLASMA  URINALYSIS, MICROSCOPIC (REFLEX)  PROCALCITONIN  LACTIC ACID, PLASMA  TROPONIN I (HIGH SENSITIVITY)  TROPONIN I (HIGH SENSITIVITY)   ____________________________________________  EKG  ED ECG REPORT I, Baltazar Pekala J, the attending physician, personally viewed and interpreted this ECG.   Date: 05/07/2021  EKG Time: 0554  Rate: 50  Rhythm: sinus bradycardia  Axis: Normal  Intervals:none  ST&T Change: Nonspecific  ____________________________________________  RADIOLOGY I, Kasiah Manka J, personally viewed and evaluated these images (plain radiographs) as part of my medical decision making, as well as reviewing the written report by the radiologist.  ED MD interpretation: No ICH; no acute cardiopulmonary process  Official radiology report(s): DG Chest 2 View  Result Date: 05/07/2021 CLINICAL DATA:  Recent fall injury. EXAM: CHEST - 2 VIEW COMPARISON:  Chest CT 03/04/2021. FINDINGS: The cardiac size and vascular pattern are normal. There is calcification in the aortic arch and old CABG changes. The lungs are clear. The sulci are sharp. Mild osteopenia and thoracic spondylosis. IMPRESSION: No active cardiopulmonary disease.  Stable post CABG chest.  Electronically Signed   By: Telford Nab M.D.   On: 05/07/2021 05:51   CT Head Wo Contrast  Result Date: 05/07/2021 CLINICAL DATA:  75 year old male status post fall striking head on dresser. EXAM: CT HEAD WITHOUT  CONTRAST TECHNIQUE: Contiguous axial images were obtained from the base of the skull through the vertex without intravenous contrast. COMPARISON:  Brain MRI 01/15/2015.  Head CT 11/08/2020. FINDINGS: Brain: Stable cerebral volume. No midline shift, ventriculomegaly, mass effect, evidence of mass lesion, intracranial hemorrhage or evidence of cortically based acute infarction. Gray-white matter differentiation appears stable and within normal limits for age. Vascular: Calcified atherosclerosis at the skull base. No suspicious intracranial vascular hyperdensity. Skull: Stable, intact. Sinuses/Orbits: Visualized paranasal sinuses and mastoids are stable and well aerated. Other: No discrete orbit or scalp soft tissue injury identified. IMPRESSION: 1. No acute traumatic injury identified. 2. Stable and negative for age non contrast CT appearance of the brain. Electronically Signed   By: Genevie Ann M.D.   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  Differential diagnosis includes but is not limited to [place], SDH, skull fracture, etc.  Will obtain noncontrast  CT head to evaluate intracranial injury.  Patient denies complaints at this time.  Clinical Course as of 05/07/21 0701  Tue May 07, 2021  0517 Updated patient and spouse who is at bedside of negative CT head.  Patient eager for discharge home.  Strict return precautions given.  Both verbalized understanding and agree with plan of care. [JS]  0102 During our wrap-up session I had asked if there was anything patient or spouse would like me to check.  Initially they declined but now they are expressing concern that patient was shaking more this morning and did not feel it was totally attributable to his Parkinson's.  Patient states this is happened before and was caused by pneumonia.  Will obtain lab work, UA and chest x-ray. [JS]  [id number] Trace ketones noted on UA, otherwise negative.  No pneumonia on chest x-ray.  Respiratory panel was negative.  Lactic acid 1.9.  Procalcitonin pending.  IV fluids infusing.  Care transferred to Dr. Ellender Hose at change of shift.  Anticipate discharge home if procalcitonin negative.  Blood and urine cultures are pending. [JS]    Clinical Course User Index [JS] Paulette Blanch, MD     ____________________________________________   FINAL CLINICAL IMPRESSION(S) / ED DIAGNOSES  Final diagnoses:  Fall, initial encounter  Injury of head, initial encounter  Parkinson's disease ([place])  Dehydration     ED Discharge Orders     None        Note:  This document was prepared using Dragon voice recognition software and may include unintentional dictation errors.    Paulette Blanch, MD 05/07/21 7253    Paulette Blanch, MD 05/07/21 [phone]

## 2021-05-07 NOTE — ED Triage Notes (Signed)
Pt to ED via EMS from home, pt states that he lost his balance getting out of bed this morning and fell hitting the right side of his head on the dresser. Pt denies loc. Denies blood thinner use, only takes aspirin.

## 2021-05-07 NOTE — ED Provider Notes (Signed)
Patient remains unable to ambulate. He is weak, dizzy, and states he feels unwell. Procalcitonin negative, doubt bacterial illness/sepsis. Pt does have a h/o recurrent aspiration PNA and has had a mild cough, so will check CT for further assessment. Will consider MR as well given his dizziness + inability to ambulate. DDx includes HTN urgency causing his underlying PD to worsen. Will admit for further management, PT/OT.   Duffy Bruce, MD 05/07/21 [phone]

## 2021-05-07 NOTE — Progress Notes (Signed)
Reached out for report. Per primary rn they were having an issue on the unit and would not be able to take this patient right now. Will check back.

## 2021-05-07 NOTE — ED Notes (Signed)
Patient back from MRI.

## 2021-05-07 NOTE — Evaluation (Signed)
Occupational Therapy Evaluation Patient Details Name: Johnathan Arnold. MRN: [id number] DOB: 12-26-1945 Today's Date: 05/07/2021   History of Present Illness Johnathan Arnold. is a 75 y.o. male with a hx of  with a hx of CAD s/p CABG in 1999, HTN, CKD, HLD, b/l carotid artery disease, Parkinson's, OSA on CPAP, DM2, moderate pulmonary HTN. Pt brought to the ED via EMS from home status post fall with head injury.   Clinical Impression   Johnathan Arnold was seen for OT/PT co-evaluation this date. Prior to hospital admission, pt was Independent with mobility and ADLs including recently d/c from outpatient PT. Pt lives with spouse in 2 level home, able to live on main. Pt presents to acute OT demonstrating near baseline ADL performance. Pt requires SBA don/doff shirt/underwear in standing - single UE support for single leg stance to remove underwear. CGA for toilet t/f. Upon hospital discharge, recommend no OT follow up. All education complete, will sign off.        Recommendations for follow up therapy are one component of a multi-disciplinary discharge planning process, led by the attending physician.  Recommendations may be updated based on patient status, additional functional criteria and insurance authorization.   Follow Up Recommendations  No OT follow up    Assistance Recommended at Discharge Intermittent Supervision/Assistance  Functional Status Assessment  Patient has not had a recent decline in their functional status  Equipment Recommendations  None recommended by OT    Recommendations for Other Services       Precautions / Restrictions Precautions Precautions: Fall Restrictions Weight Bearing Restrictions: No      Mobility Bed Mobility Overal bed mobility: Modified Independent                  Transfers Overall transfer level: Needs assistance Equipment used: None Transfers: Sit to/from Stand Sit to Stand: Min guard                  Balance Overall  balance assessment: Needs assistance Sitting-balance support: No upper extremity supported;Feet supported Sitting balance-Leahy Scale: Good     Standing balance support: Single extremity supported;During functional activity Standing balance-Leahy Scale: Fair                             ADL either performed or assessed with clinical judgement   ADL Overall ADL's : Needs assistance/impaired                                       General ADL Comments: SBA don/doff shirt/underwear in standing - single UE support for single leg stance to remove underwear. CGA for toilet t/f      Pertinent Vitals/Pain Pain Assessment: No/denies pain     Hand Dominance Right   Extremity/Trunk Assessment Upper Extremity Assessment Upper Extremity Assessment: Overall WFL for tasks assessed   Lower Extremity Assessment Lower Extremity Assessment: Generalized weakness       Communication Communication Communication: HOH   Cognition Arousal/Alertness: Awake/alert Behavior During Therapy: WFL for tasks assessed/performed Overall Cognitive Status: Within Functional Limits for tasks assessed                                       General Comments       Exercises  Exercises: Other exercises Other Exercises Other Exercises: Pt educated re: OT role, DME recs, d/c recs, falls prevention Other Exercises: LBD, UBD, toileting, sup<>sit, sit<>stand, sitting/standing balance/tolerance   Shoulder Instructions      Home Living Family/patient expects to be discharged to:: Private residence Living Arrangements: Spouse/significant other Available Help at Discharge: Family;Available 24 hours/day Type of Home: House Home Access: Stairs to enter [employer] of Steps: 1 Entrance Stairs-Rails: None Home Layout: Two level;Able to live on main level with bedroom/bathroom               Home Equipment: None          Prior Functioning/Environment  Prior Level of Function : Independent/Modified Independent;Driving             Mobility Comments: recently d/c from outpatient PT          OT Problem List: Decreased activity tolerance;Impaired balance (sitting and/or standing)         OT Goals(Current goals can be found in the care plan section) Acute Rehab OT Goals Patient Stated Goal: to go home OT Goal Formulation: With patient Time For Goal Achievement: 05/21/21 Potential to Achieve Goals: Good             Co-evaluation PT/OT/SLP Co-Evaluation/Treatment: Yes Reason for Co-Treatment: For patient/therapist safety PT goals addressed during session: Mobility/safety with mobility OT goals addressed during session: ADL's and self-care      AM-PAC OT "6 Clicks" Daily Activity     Outcome Measure Help from another person eating meals?: None Help from another person taking care of personal grooming?: None Help from another person toileting, which includes using toliet, bedpan, or urinal?: A Little Help from another person bathing (including washing, rinsing, drying)?: A Little Help from another person to put on and taking off regular upper body clothing?: None Help from another person to put on and taking off regular lower body clothing?: A Little 6 Click Score: 21   End of Session Equipment Utilized During Treatment: Gait belt  Activity Tolerance: Patient tolerated treatment well Patient left: in bed;with call bell/phone within reach;with family/visitor present;with nursing/sitter in room  OT Visit Diagnosis: Unsteadiness on feet (R26.81)                Time: 5188-4166 OT Time Calculation (min): 21 min Charges:  OT General Charges $OT Visit: 1 Visit OT Evaluation $OT Eval Low Complexity: 1 Low  Dessie Coma, M.S. OTR/L  05/07/21, 3:28 PM  ascom [phone]

## 2021-05-07 NOTE — Consult Note (Signed)
Cardiology Consultation:   Patient ID: Johnathan Arnold. MRN: [id number]; DOB: January 21, 1946  Admit date: 05/07/2021 Date of Consult: 05/07/2021  PCP:  [name], MD   [facility] Providers Cardiologist:  [name], MD   {    Patient Profile:   Johnathan Arnold. is a 75 y.o. male with a hx of  with a hx of CAD s/p CABG in 1999, HTN, CKD, HLD, b/l carotid artery disease, Parkinson's, OSA on CPAP, DM2, moderate pulmonary HTN who is being seen 05/07/2021 for the evaluation of bradycardia at the request of Dr. Ouida Sills.  History of Present Illness:   Mr. Kochan Previous CABG in 1999.  He had a right and left heart cath 01/2018 with occluded native arteries with patent grafts including LIMA to LAD, SVG to diagonal, SVG to OM, SVG to distal RCA.  Right heart cath showed normal filling pressures, mild pulmonary hypertension, normal cardiac output.  Known chronic bilateral exertional leg pain with known lower extremity arterial Doppler in 2019.   He had a right heart cath 03/29/2019 with moderate pulmonary hypertension with wedge pressure 17 mmHg.  Pulmonary vascular resistance 2.22 Woods units.  Pulmonary hypertension felt to be mixed etiology.  Due to chronic bilateral exertional leg pain with ABI and aorta duplex summer 2021 showed no evidence of significant aortoiliac disease.   Last seen 9/8 and reported he felt better with a higher BP and was taking losartan as needed. Also on coreg and Imdur. Coreg previously decreased for bradycardia. Pulse was 57bpm.  Patient presented to the ER 05/07/21 for a fall. He fell while getting up from his bed and hit his head on a nightstand. No LOC. Reported worsening fatigue and weakness the last several days. No chest pain or SOB. No LLE. Did not feel dizzy or lightheaded before the fall. Reports he has vertigo at times. Says heart rate is generally low, has not been lower than normal. No recent fever, chills, nausea, vomiting.   In the ER  heart rate down to 48bpm. CT head and MRI brain without acute abnormality. Labs showed sodium 135, potassium 4.3, Scr 1.65, BUN 18, normal LFTs. HS trop 7>7. LA normal, procal wnl. Hgb 11.8, WBC 4.3. Respiratory panel negative. Given IVF and admitted.    Past Medical History:  Diagnosis Date   3-vessel coronary artery disease    s/p  5 vessel CABG   Diabetes mellitus without complication ([place])    History of cardiac catheterization 2011   [facility]   Hyperlipidemia    Hypertension    Hypertriglyceridemia    Parkinson's disease ([place])    Pneumonia 12/28/2020   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 LIMA to LAD, SVG to D1, OM1 and RPDA   CORONARY ARTERY BYPASS GRAFT  03/1998   5 vessel, [facility]   RIGHT HEART CATH N/A 04/04/2019   Procedure: RIGHT HEART CATH;  Surgeon: Wellington Hampshire, MD;  Location: [place] [facility];  Service: Cardiovascular;  Laterality: N/A;   RIGHT/LEFT HEART CATH AND CORONARY ANGIOGRAPHY N/A 02/01/2018   Procedure: RIGHT/LEFT HEART CATH AND CORONARY ANGIOGRAPHY;  Surgeon: Wellington Hampshire, MD;  Location: [place] [facility];  Service: Cardiovascular;  Laterality: N/A;     Home Medications:  Prior to Admission medications   Medication Sig Start Date End Date Taking? Authorizing Provider  aspirin EC  81 MG tablet Take 1 tablet (81 mg total) by mouth at bedtime. 09/28/20   Loel Dubonnet, NP  carbidopa-levodopa (SINEMET CR) 50-200 MG tablet Take 1 tablet by mouth at bedtime. 10/30/20   Tat, Eustace Quail, DO  carbidopa-levodopa (SINEMET IR) 25-100 MG tablet Take 2 tablets by mouth 3 (three) times daily. 2 at 8am, 2 at 11am, 2 at 2 pm, 1 at 5pm 01/24/21   Tat, Wells Guiles S, DO  carvedilol (COREG) 3.125 MG tablet Take 1 tablet (3.125 mg total) by mouth 2 (two) times daily. 04/08/21 07/07/21  Wellington Hampshire, MD  clonazePAM (KLONOPIN) 0.5 MG tablet TAKE 1 AND 1/2  TABLET BY MOUTH EVERY NIGHT AT BEDTIME 03/27/21   Tat, Eustace Quail, DO  diazepam (VALIUM) 5 MG tablet Take 1 tablet (5 mg total) by mouth every 8 (eight) hours as needed for anxiety (or vertigo). 04/05/21 04/05/22  [name], MD  furosemide (LASIX) 20 MG tablet TAKE ONE TABLET BY MOUTH DAILY 03/08/21   Wellington Hampshire, MD  isosorbide mononitrate (IMDUR) 60 MG 24 hr tablet TAKE ONE TABLET BY MOUTH DAILY 03/25/21   Wellington Hampshire, MD  losartan (COZAAR) 25 MG tablet Take 1 tablet (25 mg total) by mouth daily. 12/20/20   [name], MD  Multiple Vitamin (MULTIVITAMIN) tablet Take 1 tablet by mouth daily.    [provider]  nitroGLYCERIN (NITROSTAT) 0.4 MG SL tablet Place 1 tablet (0.4 mg total) under the tongue every 5 (five) minutes as needed for chest pain. 09/28/20   Loel Dubonnet, NP  omeprazole (PRILOSEC) 20 MG capsule TAKE ONE CAPSULE BY MOUTH EVERY MORNING 05/05/21   [name], MD  polyethylene glycol (MIRALAX / GLYCOLAX) packet Take 17 g by mouth daily.     [provider]  ranolazine (RANEXA) 1000 MG SR tablet Take 1 tablet (1,000 mg total) by mouth 2 (two) times daily. 02/21/21   Wellington Hampshire, MD  rosuvastatin (CRESTOR) 10 MG tablet Take 1 tablet (10 mg total) by mouth daily. 05/22/20 04/29/22  Wellington Hampshire, MD  tamsulosin (FLOMAX) 0.4 MG CAPS capsule TAKE ONE CAPSULE BY MOUTH DAILY 04/30/20   Hollice Espy, MD    Inpatient Medications: Scheduled Meds:  carbidopa-levodopa  1 tablet Oral QHS   carbidopa-levodopa  2 tablet Oral TID   enoxaparin (LOVENOX) injection  40 mg Subcutaneous Q24H   hydrALAZINE  10 mg Oral Q6H   [START ON 05/08/2021] isosorbide mononitrate  60 mg Oral Daily   losartan  25 mg Oral Daily   polyethylene glycol  17 g Oral Daily   [START ON 05/08/2021] ranolazine  1,000 mg Oral BID   rosuvastatin  10 mg Oral Daily   tamsulosin  0.4 mg Oral Daily   Continuous Infusions:  PRN Meds: acetaminophen **OR**  acetaminophen, bisacodyl, nitroGLYCERIN  Allergies:   No Known Allergies  Social History:   Social History   Socioeconomic History   Marital status: Married    Spouse name: Not on file   Number of children: 2   Years of education: Not on file   Highest education level: Master's degree (e.g., MA, MS, MEng, MEd, MSW, MBA)  Occupational History   Occupation: retired    Comment: IT work  Tobacco Use   Smoking status: Never    Passive exposure: Yes   Smokeless tobacco: Never  Vaping Use   Vaping Use: Never used  Substance and Sexual Activity   Alcohol use: Yes    Comment:  occasional beer   Drug use: No   Sexual activity: Not Currently  Other Topics Concern   Not on file  Social History Narrative   Right Handed    Lives in a two story home    Social Determinants of Health   Financial Resource Strain: Low Risk    Difficulty of Paying Living Expenses: Not hard at all  Food Insecurity: No Food Insecurity   Worried About Charity fundraiser in the Last Year: Never true   Purple [place] in the Last Year: Never true  Transportation Needs: No Transportation Needs   Lack of Transportation (Medical): No   Lack of Transportation (Non-Medical): No  Physical Activity: Sufficiently Active   Days of Exercise per Week: 3 days   Minutes of Exercise per Session: 60 min  Stress: No Stress Concern Present   Feeling of Stress : Not at all  Social Connections: Unknown   Frequency of Communication with Friends and Family: More than three times a week   Frequency of Social Gatherings with Friends and Family: More than three times a week   Attends Religious Services: Not on Electrical engineer or Organizations: Yes   Attends Music therapist: More than 4 times per year   Marital Status: Married  Human resources officer Violence: Not At Risk   Fear of Current or Ex-Partner: No   Emotionally Abused: No   Physically Abused: No   Sexually Abused: No    Family History:     Family History  Problem Relation Age of Onset   Heart attack Mother 33   Hypertension Mother    Heart attack Father 8   Heart disease Father    Heart disease Brother    Healthy Son      ROS:  Please see the history of present illness.   All other ROS reviewed and negative.     Physical Exam/Data:   Vitals:   05/07/21 0411 05/07/21 0614 05/07/21 0814 05/07/21 0917  BP: (!) 151/72 (!) 192/57 (!) 203/62 (!) 155/56  Pulse: (!) 50 (!) 48 (!) 50 (!) 55  Resp: 16 17 16 16   Temp: 98.4 F (36.9 C)     TempSrc: Oral     SpO2: 100% 96% 98% 99%   No intake or output data in the 24 hours ending 05/07/21 1210 Last 3 Weights 03/08/2021 03/06/2021 02/14/2021  Weight (lbs) 178 lb 177 lb 9.6 oz 178 lb  Weight (kg) 80.74 kg 80.559 kg 80.74 kg     There is no height or weight on file to calculate BMI.  General:  Well nourished, well developed, in no acute distress HEENT: normal Neck: no JVD Vascular: No carotid bruits; Distal pulses 2+ bilaterally Cardiac:  normal S1, S2; bradycardic, RR; no murmur  Lungs:  clear to auscultation bilaterally, no wheezing, rhonchi or rales  Abd: soft, nontender, no hepatomegaly  Ext: no edema Musculoskeletal:  No deformities, BUE and BLE strength normal and equal Skin: warm and dry  Neuro:  CNs 2-12 intact, no focal abnormalities noted Psych:  Normal affect   EKG:  The EKG was personally reviewed and demonstrates:  SB, 50bpom, possible septal infarct, nonspecific t wave changes Telemetry:  Telemetry was personally reviewed and demonstrates:  pending  Relevant CV Studies:  Echo 01/31/21  1. Left ventricular ejection fraction, by estimation, is 60 to 65%. The  left ventricle has normal function. The left ventricle has no regional  wall motion abnormalities. Left  ventricular diastolic parameters are  consistent with Grade II diastolic  dysfunction (pseudonormalization).   2. Right ventricular systolic function is low normal. The right  ventricular  size is normal. There is moderately elevated pulmonary artery  systolic pressure. The estimated right ventricular systolic pressure is  88.8 mmHg.   3. Left atrial size was mildly dilated.   4. The mitral valve is normal in structure. Mild mitral valve  regurgitation.   5. The aortic valve is tricuspid. Aortic valve regurgitation is not  visualized.   6. The inferior vena cava is dilated in size with >50% respiratory  variability, suggesting right atrial pressure of 8 mmHg.   Right Cardiac cath 04/04/19 Successful right heart catheterization via the right brachial vein. RA pressure: 10/15 with a mean of 13 mmHg. RV: 56/4 with an end-diastolic pressure of 16 mmHg PA: 54/13 with a mean of 29 mmHg.  Pulmonary vascular resistance is 2.22 Woods units Pulmonary capillary wedge pressure is 17 mmHg with prominent V waves. Cardiac output is 5.4 L/min with a cardiac index of 2.79.   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  He does have prominent V waves suggestive of significant mitral regurgitation.  I did review his recent echocardiogram done in September and his moderate regurgitation was moderate at most. His volume status appears to be reasonable and I am hesitant to diurese him further given underlying chronic kidney disease  R/L heart cath 2019 RIGHT/LEFT HEART CATH AND CORONARY ANGIOGRAPHY   Conclusion    Prox Cx lesion is 100% stenosed. Prox LAD lesion is 100% stenosed. Prox RCA to Mid RCA lesion is 90% stenosed. SVG and is normal in caliber. The graft exhibits no disease. SVG and is normal in caliber. The graft exhibits no disease. Ost 2nd Diag lesion is 100% stenosed. SVG and is normal in caliber. The graft exhibits no disease. Dist RCA lesion is 100% stenosed. LIMA graft was visualized by angiography and is normal in caliber. The  graft exhibits no disease. Mid LAD lesion is 60% stenosed. Ost RPDA lesion is 60% stenosed.   1.  Occluded native coronary arteries with patent grafts including LIMA to LAD, SVG to diagonal, SVG to OM and SVG to distal RCA.  No significant graft disease.  Suspect proximal artery ischemia given occlusion of native vessels. 2.  Left ventricular angiography was not performed due to chronic kidney disease. 3.  Right heart catheterization showed normal filling pressures, mild pulmonary hypertension and normal cardiac output.  Mean PA pressure was 23 mmHg, pulmonary capillary wedge pressure was 9 mmHg and cardiac output was 7.7 L/min.   Recommendations: Continue medical therapy.   Laboratory Data:  High Sensitivity Troponin:   Recent Labs  Lab 05/07/21 0534 05/07/21 0832  TROPONINIHS 7 7     Chemistry Recent Labs  Lab 05/07/21 0534  NA 135  K 4.3  CL 100  CO2 28  GLUCOSE 121*  BUN 18  CREATININE 1.65*  CALCIUM 9.2  GFRNONAA 43*  ANIONGAP 7    Recent Labs  Lab 05/07/21 0534  PROT 6.6  ALBUMIN 4.1  AST 24  ALT 8  ALKPHOS 47  BILITOT 1.2   Lipids No results for input(s): CHOL, TRIG, HDL, LABVLDL, LDLCALC, CHOLHDL in the last 168 hours.  Hematology Recent Labs  Lab 05/07/21 0534  WBC 4.3  RBC 3.38*  HGB 11.8*  HCT 33.7*  MCV 99.7  MCH 34.9*  MCHC 35.0  RDW 12.4  PLT 133*   Thyroid No results for input(s): TSH, FREET4 in the last 168 hours.  BNPNo results for input(s): BNP, PROBNP in the last 168 hours.  DDimer No results for input(s): DDIMER in the last 168 hours.   Radiology/Studies:  DG Chest 2 View  Result Date: 05/07/2021 CLINICAL DATA:  Recent fall injury. EXAM: CHEST - 2 VIEW COMPARISON:  Chest CT 03/04/2021. FINDINGS: The cardiac size and vascular pattern are normal. There is calcification in the aortic arch and old CABG changes. The lungs are clear. The sulci are sharp. Mild osteopenia and thoracic spondylosis. IMPRESSION: No active cardiopulmonary  disease.  Stable post CABG chest. Electronically Signed   By: Telford Nab M.D.   On: 05/07/2021 05:51   CT Head Wo Contrast  Result Date: 05/07/2021 CLINICAL DATA:  75 year old male status post fall striking head on dresser. EXAM: CT HEAD WITHOUT CONTRAST TECHNIQUE: Contiguous axial images were obtained from the base of the skull through the vertex without intravenous contrast. COMPARISON:  Brain MRI 01/15/2015.  Head CT 11/08/2020. FINDINGS: Brain: Stable cerebral volume. No midline shift, ventriculomegaly, mass effect, evidence of mass lesion, intracranial hemorrhage or evidence of cortically based acute infarction. Gray-white matter differentiation appears stable and within normal limits for age. Vascular: Calcified atherosclerosis at the skull base. No suspicious intracranial vascular hyperdensity. Skull: Stable, intact. Sinuses/Orbits: Visualized paranasal sinuses and mastoids are stable and well aerated. Other: No discrete orbit or scalp soft tissue injury identified. IMPRESSION: 1. No acute traumatic injury identified. 2. Stable and negative for age non contrast CT appearance of the brain. Electronically Signed   By: Genevie Ann M.D.   On: 05/07/2021 05:13   CT Chest Wo Contrast  Result Date: 05/07/2021 CLINICAL DATA:  Respiratory illness, cough, history of aspiration EXAM: CT CHEST WITHOUT CONTRAST TECHNIQUE: Multidetector CT imaging of the chest was performed following the standard protocol without IV contrast. COMPARISON:  03/04/2021, 11/21/2020 FINDINGS: Cardiovascular: Aortic atherosclerosis. Normal heart size. Extensive 3 vessel coronary artery calcifications and stents status post median sternotomy and CABG. No pericardial effusion. Mediastinum/Nodes: No enlarged mediastinal, hilar, or axillary lymph nodes. Thyroid gland, trachea, and esophagus demonstrate no significant findings. Lungs/Pleura: Occasional small pulmonary nodules are stable and benign, for example a 0.7 cm nodule of the  posterior right apex (series 3, image 25) and a 0.4 cm nodule of the superior segment right lower lobe (series 3, image 80). No pleural effusion or pneumothorax. Upper Abdomen: No acute abnormality. Musculoskeletal: No chest wall mass or suspicious bone lesions identified. IMPRESSION: 1. No acute airspace disease. 2. Occasional small pulmonary nodules are stable and benign. No further follow-up or characterization is required for these nodules. 3. Coronary artery disease. Aortic Atherosclerosis (ICD10-I70.0). Electronically Signed   By: Delanna Ahmadi M.D.   On: 05/07/2021 08:45   MR BRAIN WO CONTRAST  Result Date: 05/07/2021 CLINICAL DATA:  Dizziness. Fall with head injury. Parkinson's disease. EXAM: MRI HEAD WITHOUT CONTRAST TECHNIQUE: Multiplanar, multiecho pulse sequences of the brain and surrounding structures were obtained without intravenous contrast. COMPARISON:  Head CT earlier same day.  MRI 01/15/2015. FINDINGS: Brain: The study suffers from some motion degradation. Diffusion imaging does not show any acute or subacute infarction. The brainstem and cerebellum are normal. Cerebral hemispheres show a few scattered punctate foci of T2 and FLAIR signal within the white matter consistent with mild chronic small vessel change. No cortical or large vessel territory infarction. No mass lesion,  hemorrhage, hydrocephalus or extra-axial collection. Vascular: Major vessels at the base of the brain show flow. Skull and upper cervical spine: Negative Sinuses/Orbits: Clear/normal Other: None IMPRESSION: No acute or reversible finding. Mild chronic small-vessel ischemic changes of the cerebral hemispheric white matter, slightly progressive since 2016. Electronically Signed   By: Nelson Chimes M.D.   On: 05/07/2021 09:55     Assessment and Plan:   Fall - fall this morning due to b/l leg weakness. Patient denies prodromal dizziness or lightheadedness, chest pain, SOB, palpitations. No LOC. Reports vertigo - In  the ER BP down to 48 however patient has h/o low heart rate on Coreg. Coreg held - CT head and MRI brain unremarkable.  - EKG with SB 50bpm - Labs unremarkable - CXR with no active disease - check orthostatics - PT/OT consulted  Bradycardia - he has h/o bradcyardia - hold BB for BB washout x 48 hours - monitor on tele - do not suspect he will need PPM  CAD s/p remote CABG - No anginal symptoms - No further ischemic work-up - continue ASA, statin, Imdur, Ranexa. Hold BB as above  B/l carotid artery disease - mild nonobstructive disease - continue statin, ASA  HTN - PTA losartan PRN, Imdur, coreg, lasix - hold coreg as above - bp good - started on hydralazine elevated bp  For questions or updates, please contact [place] Please consult [email or web address] for contact info under    Signed, Rajinder Mesick Ninfa Meeker, PA-C  05/07/2021 12:10 PM

## 2021-05-07 NOTE — ED Notes (Signed)
Patient on phone with MRI tech at this time

## 2021-05-07 NOTE — Evaluation (Signed)
Physical Therapy Evaluation Patient Details Name: Johnathan Arnold. MRN: [id number] DOB: May 13, 1946 Today's Date: 05/07/2021  History of Present Illness  Johnathan Arnold. is a 75 y.o. male with a hx of  with a hx of CAD s/p CABG in 1999, HTN, CKD, HLD, b/l carotid artery disease, Parkinson's, OSA on CPAP, DM2, moderate pulmonary HTN. Pt brought to the ED via EMS from home status post fall with head injury.   Clinical Impression  Pt received supine in bed, OT assessing orthostatic vitals; wife present. Pt familiar to PT from recent OPPT services. Bed mobility performed MOD I; STS with CGA and ambulation with MIN A. Multiple occasions of pt drifting left during ambualtion - BOS became narrow however no crossover of feet. MIN A required on 2 occasions to prevent LOB. Balance did improve during second and third bouts of ambulation (~29ft each) to the restroom. Pt remained standing for 2 prolonged bouts - during bed linen change and for toileting/dressing in the restroom. CGA provided at all time for mild steadying assist and safety. MMT indicates BLE strength WFL. Recommending HHPT to address deficits in balance. Would benefit from skilled PT to address above deficits and promote optimal return to PLOF.      Recommendations for follow up therapy are one component of a multi-disciplinary discharge planning process, led by the attending physician.  Recommendations may be updated based on patient status, additional functional criteria and insurance authorization.  Follow Up Recommendations Home health PT    Assistance Recommended at Discharge Intermittent Supervision/Assistance  Functional Status Assessment Patient has had a recent decline in their functional status and demonstrates the ability to make significant improvements in function in a reasonable and predictable amount of time.  Equipment Recommendations  None recommended by PT    Recommendations for Other Services       Precautions  / Restrictions Precautions Precautions: Fall Restrictions Weight Bearing Restrictions: No      Mobility  Bed Mobility Overal bed mobility: Modified Independent                  Transfers Overall transfer level: Needs assistance Equipment used: None Transfers: Sit to/from Stand Sit to Stand: Min guard           General transfer comment: CGA to steady. Multiple STS throughout session from elevated ER bed.    Ambulation/Gait Ambulation/Gait assistance: Min assist Gait Distance (Feet): 200 Feet Assistive device: None Gait Pattern/deviations: Step-through pattern Gait velocity: decreased     General Gait Details: multiple occasions of pt drifting left during ambualtion - BOS became narrow however no crossover of feet. MIN A required on 2 occasions to prevent LOB. Balance did improve during second and third bouts of ambulation (~46ft each).  Stairs            Wheelchair Mobility    Modified Rankin (Stroke Patients Only)       Balance Overall balance assessment: Needs assistance Sitting-balance support: No upper extremity supported;Feet supported Sitting balance-Leahy Scale: Good     Standing balance support: During functional activity;No upper extremity supported Standing balance-Leahy Scale: Poor Standing balance comment: No UE support however continuous CGA provided in static and dynamic standing with MIN A required on 2 occasions to prevent LOB during ambulation.                             Pertinent Vitals/Pain Pain Assessment: No/denies pain    Home Living  Family/patient expects to be discharged to:: Private residence Living Arrangements: Spouse/significant other Available Help at Discharge: Family;Available 24 hours/day Type of Home: House Home Access: Stairs to enter Entrance Stairs-Rails: None Entrance Stairs-Number of Steps: 1   Home Layout: Two level;Able to live on main level with bedroom/bathroom Home Equipment: None       Prior Function Prior Level of Function : Independent/Modified Independent;Driving             Mobility Comments: recently d/c from outpatient PT       Hand Dominance   Dominant Hand: Right    Extremity/Trunk Assessment   Upper Extremity Assessment Upper Extremity Assessment: Overall WFL for tasks assessed    Lower Extremity Assessment Lower Extremity Assessment: Overall WFL for tasks assessed (minor weakness in hip flexors)       Communication   Communication: HOH  Cognition Arousal/Alertness: Awake/alert Behavior During Therapy: WFL for tasks assessed/performed Overall Cognitive Status: Within Functional Limits for tasks assessed                                          General Comments      Exercises Other Exercises Other Exercises: Pt educated re: OT role, DME recs, d/c recs, falls prevention Other Exercises: LBD, UBD, standing toileting, sup<>sit, sit<>stand, sitting/standing balance/tolerance   Assessment/Plan    PT Assessment Patient needs continued PT services  PT Problem List Decreased strength;Decreased mobility;Decreased activity tolerance;Decreased balance       PT Treatment Interventions DME instruction;Therapeutic exercise;Gait training;Balance training;Neuromuscular re-education;Stair training;Functional mobility training;Therapeutic activities;Patient/family education    PT Goals (Current goals can be found in the Care Plan section)  Acute Rehab PT Goals Patient Stated Goal: to get stronger and improve balance PT Goal Formulation: With patient Time For Goal Achievement: 05/21/21 Potential to Achieve Goals: Good    Frequency Min 2X/week   Barriers to discharge        Co-evaluation   Reason for Co-Treatment: For patient/therapist safety PT goals addressed during session: Mobility/safety with mobility;Balance OT goals addressed during session: ADL's and self-care       AM-PAC PT "6 Clicks" Mobility  Outcome  Measure Help needed turning from your back to your side while in a flat bed without using bedrails?: None Help needed moving from lying on your back to sitting on the side of a flat bed without using bedrails?: None Help needed moving to and from a bed to a chair (including a wheelchair)?: A Little Help needed standing up from a chair using your arms (e.g., wheelchair or bedside chair)?: A Little Help needed to walk in hospital room?: A Little Help needed climbing 3-5 steps with a railing? : A Little 6 Click Score: 20    End of Session Equipment Utilized During Treatment: Gait belt Activity Tolerance: Patient tolerated treatment well Patient left: in bed;with call bell/phone within reach;with family/visitor present Nurse Communication: Mobility status PT Visit Diagnosis: Unsteadiness on feet (R26.81);Other abnormalities of gait and mobility (R26.89);Difficulty in walking, not elsewhere classified (R26.2)    Time: 3244-0102 PT Time Calculation (min) (ACUTE ONLY): 17 min   Charges:   PT Evaluation $PT Eval Low Complexity: 1 Low          Patrina Levering PT, DPT 05/07/21 4:22 PM [phone]

## 2021-05-07 NOTE — ED Notes (Signed)
Patient transported to CT 

## 2021-05-07 NOTE — Discharge Instructions (Signed)
Continue to take your medications daily as directed by your doctor.  Return to the ER for worsening symptoms, persistent vomiting, lethargy or other concerns.

## 2021-05-07 NOTE — H&P (Signed)
History and Physical    Johnathan Arnold. WGN:[id number] DOB: 03-23-1946 DOA: 05/07/2021 PCP: [name], MD  Chief Complaint: fall Historian: patient HPI:  Johnathan Arnold. is a 75 y.o. male with a PMH significant for Parkinson's disease, HTN, Type II DM, CAD s/p CABG, HLD, obesity, vertigo, bilateral carotid stenosis, h/o skin cancer, hard of hearing, chronic/stable lung nodules, sleep apnea, anemia. They presented from home to the ED on 05/07/2021 with fall to floor while getting up from bed this morning and hit head on nightstand. He denies LOC and was able to help himself up to bed. He further endorses that he has had gradually progressive generalized weakness over the past several weeks leading to decreased ambulation. Denies any focal weakness, speech abnormalities. He denies any new medication changes and has been adherent with his home medications other than mising losartan occasionally. He has a long history of vertigo. At baseline, he ambulates and performs all ADLs.  He denies chest pain, cough, respiratory distress, lower extremity asymmetrical swelling.  He is feeling like his normal baseline self at rest. However, states that he has not been up to walk/move since presenting to hospital so unaware if still symptomatic.   In the ED, it was found that they had severe range blood pressures with bradycardia. Head CT, brain MRI showed no acute intracranial abnormality.  He required maximum assistance to transfer from bed to gurney for head imaging.  They were treated with hydralazine, imdur, losartan, and his home Parkinson's medications. Patient was admitted to medicine service for further workup and management of fall, generalized weakness as outlined in detail below.  Review of Systems: As per HPI otherwise 10 point review of systems negative.   Past Medical History:  Diagnosis Date   3-vessel coronary artery disease    s/p  5 vessel CABG   Diabetes mellitus without  complication ([place])    History of cardiac catheterization 2011   [facility]   Hyperlipidemia    Hypertension    Hypertriglyceridemia    Parkinson's disease ([place])    Pneumonia 12/28/2020   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 LIMA to LAD, SVG to D1, OM1 and RPDA   CORONARY ARTERY BYPASS GRAFT  03/1998   5 vessel, [facility]   RIGHT HEART CATH N/A 04/04/2019   Procedure: RIGHT HEART CATH;  Surgeon: Wellington Hampshire, MD;  Location: [facility];  Service: Cardiovascular;  Laterality: N/A;   RIGHT/LEFT HEART CATH AND CORONARY ANGIOGRAPHY N/A 02/01/2018   Procedure: RIGHT/LEFT HEART CATH AND CORONARY ANGIOGRAPHY;  Surgeon: Wellington Hampshire, MD;  Location: [facility];  Service: Cardiovascular;  Laterality: N/A;     reports that he has never smoked. He has been exposed to tobacco smoke. He has never used smokeless tobacco. He reports current alcohol use. He reports that he does not use drugs.  No Known Allergies  Family History  Problem Relation Age of Onset   Heart attack Mother 55   Hypertension Mother    Heart attack Father 75   Heart disease Father    Heart disease Brother    Healthy Son     Prior to Admission medications   Medication Sig Start Date End Date Taking? Authorizing Provider  aspirin EC 81 MG tablet Take 1 tablet (81 mg total) by mouth at bedtime. 09/28/20  Loel Dubonnet, NP  carbidopa-levodopa (SINEMET CR) 50-200 MG tablet Take 1 tablet by mouth at bedtime. 10/30/20   Tat, Eustace Quail, DO  carbidopa-levodopa (SINEMET IR) 25-100 MG tablet Take 2 tablets by mouth 3 (three) times daily. 2 at 8am, 2 at 11am, 2 at 2 pm, 1 at 5pm 01/24/21   Tat, Wells Guiles S, DO  carvedilol (COREG) 3.125 MG tablet Take 1 tablet (3.125 mg total) by mouth 2 (two) times daily. 04/08/21 07/07/21  Wellington Hampshire, MD  clonazePAM (KLONOPIN) 0.5 MG tablet TAKE 1 AND 1/2  TABLET BY MOUTH EVERY NIGHT AT BEDTIME 03/27/21   Tat, Eustace Quail, DO  diazepam (VALIUM) 5 MG tablet Take 1 tablet (5 mg total) by mouth every 8 (eight) hours as needed for anxiety (or vertigo). 04/05/21 04/05/22  [name], MD  furosemide (LASIX) 20 MG tablet TAKE ONE TABLET BY MOUTH DAILY 03/08/21   Wellington Hampshire, MD  isosorbide mononitrate (IMDUR) 60 MG 24 hr tablet TAKE ONE TABLET BY MOUTH DAILY 03/25/21   Wellington Hampshire, MD  losartan (COZAAR) 25 MG tablet Take 1 tablet (25 mg total) by mouth daily. 12/20/20   [name], MD  Multiple Vitamin (MULTIVITAMIN) tablet Take 1 tablet by mouth daily.    [provider]  nitroGLYCERIN (NITROSTAT) 0.4 MG SL tablet Place 1 tablet (0.4 mg total) under the tongue every 5 (five) minutes as needed for chest pain. 09/28/20   Loel Dubonnet, NP  omeprazole (PRILOSEC) 20 MG capsule TAKE ONE CAPSULE BY MOUTH EVERY MORNING 05/05/21   [name], MD  polyethylene glycol (MIRALAX / GLYCOLAX) packet Take 17 g by mouth daily.     [provider]  ranolazine (RANEXA) 1000 MG SR tablet Take 1 tablet (1,000 mg total) by mouth 2 (two) times daily. 02/21/21   Wellington Hampshire, MD  rosuvastatin (CRESTOR) 10 MG tablet Take 1 tablet (10 mg total) by mouth daily. 05/22/20 04/29/22  Wellington Hampshire, MD  tamsulosin (FLOMAX) 0.4 MG CAPS capsule TAKE ONE CAPSULE BY MOUTH DAILY 04/30/20   Hollice Espy, MD   I have personally, briefly reviewed patient's prior medical records in [place]  Physical Exam: Vitals:   05/07/21 0411 05/07/21 0614 05/07/21 0814 05/07/21 0917  BP: (!) 151/72 (!) 192/57 (!) 203/62 (!) 155/56  Pulse: (!) 50 (!) 48 (!) 50 (!) 55  Resp: 16 17 16 16   Temp: 98.4 F (36.9 C)     TempSrc: Oral     SpO2: 100% 96% 98% 99%    Constitutional: NAD, calm, comfortable HEENT: lids and conjunctivae normal Mucous membranes are moist. Posterior pharynx clear of any exudate or lesions.Normal dentition. Hard of  hearing Neck: normal, supple, no masses, no thyromegaly Respiratory: CTAB, no wheezing, no crackles. Normal respiratory effort. No accessory muscle use.  Cardiovascular: regular rhythm, slow rate. Significant systolic murmur. , no rubs / gallops. No extremity edema. 2+ pedal pulses. no clubbing / cyanosis.  Abdomen: soft, NT, ND, no masses or HSM palpated. Musculoskeletal: No joint deformity upper and lower extremities. Decreased LE muscle tone.  Skin: dry, intact, normal color, normal temperature Neurologic: Alert and oriented x 3. Normal speech.  Normal strength in UE, 3/5 symmetrical strength in LE. PERRL Psychiatric: Normal mood. Congruent affect.  Normal judgement and insight.  Labs on Admission: I have personally reviewed following labs and imaging studies  CBC: Recent Labs  Lab 05/07/21 0534  WBC 4.3  NEUTROABS 2.6  HGB 11.8*  HCT 33.7*  MCV 99.7  PLT 875*   Basic Metabolic Panel: Recent Labs  Lab 05/07/21 0534  NA 135  K 4.3  CL 100  CO2 28  GLUCOSE 121*  BUN 18  CREATININE 1.65*  CALCIUM 9.2   GFR: CrCl cannot be calculated (Unknown ideal weight.). Liver Function Tests: Recent Labs  Lab 05/07/21 0534  AST 24  ALT 8  ALKPHOS 47  BILITOT 1.2  PROT 6.6  ALBUMIN 4.1   Urine analysis:    Component Value Date/Time   COLORURINE YELLOW 05/07/2021 0534   APPEARANCEUR CLEAR 05/07/2021 0534   APPEARANCEUR Clear 04/15/2018 0906   LABSPEC 1.015 05/07/2021 0534   LABSPEC 1.009 03/15/2014 1005   PHURINE 7.0 05/07/2021 0534   GLUCOSEU NEGATIVE 05/07/2021 0534   GLUCOSEU Negative 03/15/2014 1005   HGBUR NEGATIVE 05/07/2021 0534   BILIRUBINUR NEGATIVE 05/07/2021 0534   BILIRUBINUR Negative 04/15/2018 0906   BILIRUBINUR Negative 03/15/2014 1005   KETONESUR TRACE (A) 05/07/2021 0534   PROTEINUR 100 (A) 05/07/2021 0534   UROBILINOGEN 1.0 03/16/2012 1630   NITRITE NEGATIVE 05/07/2021 0534   LEUKOCYTESUR NEGATIVE 05/07/2021 0534   LEUKOCYTESUR Negative  03/15/2014 1005    Radiological Exams on Admission: DG Chest 2 View  Result Date: 05/07/2021 CLINICAL DATA:  Recent fall injury. EXAM: CHEST - 2 VIEW COMPARISON:  Chest CT 03/04/2021. FINDINGS: The cardiac size and vascular pattern are normal. There is calcification in the aortic arch and old CABG changes. The lungs are clear. The sulci are sharp. Mild osteopenia and thoracic spondylosis. IMPRESSION: No active cardiopulmonary disease.  Stable post CABG chest. Electronically Signed   By: Telford Nab M.D.   On: 05/07/2021 05:51   CT Head Wo Contrast  Result Date: 05/07/2021 CLINICAL DATA:  75 year old male status post fall striking head on dresser. EXAM: CT HEAD WITHOUT CONTRAST TECHNIQUE: Contiguous axial images were obtained from the base of the skull through the vertex without intravenous contrast. COMPARISON:  Brain MRI 01/15/2015.  Head CT 11/08/2020. FINDINGS: Brain: Stable cerebral volume. No midline shift, ventriculomegaly, mass effect, evidence of mass lesion, intracranial hemorrhage or evidence of cortically based acute infarction. Gray-white matter differentiation appears stable and within normal limits for age. Vascular: Calcified atherosclerosis at the skull base. No suspicious intracranial vascular hyperdensity. Skull: Stable, intact. Sinuses/Orbits: Visualized paranasal sinuses and mastoids are stable and well aerated. Other: No discrete orbit or scalp soft tissue injury identified. IMPRESSION: 1. No acute traumatic injury identified. 2. Stable and negative for age non contrast CT appearance of the brain. Electronically Signed   By: Genevie Ann M.D.   On: 05/07/2021 05:13   CT Chest Wo Contrast  Result Date: 05/07/2021 CLINICAL DATA:  Respiratory illness, cough, history of aspiration EXAM: CT CHEST WITHOUT CONTRAST TECHNIQUE: Multidetector CT imaging of the chest was performed following the standard protocol without IV contrast. COMPARISON:  03/04/2021, 11/21/2020 FINDINGS:  Cardiovascular: Aortic atherosclerosis. Normal heart size. Extensive 3 vessel coronary artery calcifications and stents status post median sternotomy and CABG. No pericardial effusion. Mediastinum/Nodes: No enlarged mediastinal, hilar, or axillary lymph nodes. Thyroid gland, trachea, and esophagus demonstrate no significant findings. Lungs/Pleura: Occasional small pulmonary nodules are stable and benign, for example a 0.7 cm nodule of the posterior right apex (series 3, image 25) and a 0.4 cm nodule of the superior segment right lower lobe (series 3, image 80). No pleural effusion or pneumothorax. Upper Abdomen: No acute abnormality. Musculoskeletal: No chest wall mass or suspicious bone lesions identified. IMPRESSION: 1. No acute airspace  disease. 2. Occasional small pulmonary nodules are stable and benign. No further follow-up or characterization is required for these nodules. 3. Coronary artery disease. Aortic Atherosclerosis (ICD10-I70.0). Electronically Signed   By: Delanna Ahmadi M.D.   On: 05/07/2021 08:45   MR BRAIN WO CONTRAST  Result Date: 05/07/2021 CLINICAL DATA:  Dizziness. Fall with head injury. Parkinson's disease. EXAM: MRI HEAD WITHOUT CONTRAST TECHNIQUE: Multiplanar, multiecho pulse sequences of the brain and surrounding structures were obtained without intravenous contrast. COMPARISON:  Head CT earlier same day.  MRI 01/15/2015. FINDINGS: Brain: The study suffers from some motion degradation. Diffusion imaging does not show any acute or subacute infarction. The brainstem and cerebellum are normal. Cerebral hemispheres show a few scattered punctate foci of T2 and FLAIR signal within the white matter consistent with mild chronic small vessel change. No cortical or large vessel territory infarction. No mass lesion, hemorrhage, hydrocephalus or extra-axial collection. Vascular: Major vessels at the base of the brain show flow. Skull and upper cervical spine: Negative Sinuses/Orbits: Clear/normal  Other: None IMPRESSION: No acute or reversible finding. Mild chronic small-vessel ischemic changes of the cerebral hemispheric white matter, slightly progressive since 2016. Electronically Signed   By: Nelson Chimes M.D.   On: 05/07/2021 09:55    EKG: Independently reviewed. Sinus bradycardia  Assessment/Plan Principal Problem:   Generalized weakness   Generalized weakness  Fall, initial encounter- multifactorial. Patient has multiple medications contributing to fall risk (diazepam, clonazepam) as well as orthostatic agents (carvedilol, lasix) and has a history of vertigo and bradycardia. Exam is non-focal. Head CT/brain MRI negative. Unlikely PE based on hx/Px.  - orthostatic vital signs - PT/OT - holding carvedilol, lasix, and sedating medications - fall precautions  Bradycardia- chronic. HR 40s-50s this admission. ECG showing sinus rhythm. He denies typical anginal features.  - consult cardiology for potentially needing pacemaker - continuous cardiac tele - holding carvedilol  HTN  HLD  CAD s/p CABG  bilateral carotid stenosis- Bps 382 systolics. 1-39% stenosis bilateral carotids in 2017. Last echo 01/2021. Denies chest pain - follow up second troponin - scheduled hydralazine - continue statin - carotid duplex   Parkinson's disease- chronic, stable - continue home meds  Type II DM- in remission. Last hemoglobin A1c was 5.3 8/22. - monitor blood sugars on regular labs  Chronic normocytic anemia  thrombocytopenia- no signifcant change from baseline. Unlikely contributing to presenting symptoms. Hgb 11.8, platelets 133 - monitor   DVT prophylaxis: enoxaparin (LOVENOX) injection 40 mg Start: 05/07/21 1000   Code Status: full  Family Communication: wife at bedside  Disposition Plan: med-tele  Consults called: cardiology   Richarda Osmond, DO [facility]  05/07/2021, 9:58 AM    To contact the appropriate [facility] Attending or Consulting provider: Check the care  team in [facility] for a) attending/consulting [facility] provider name and b) the [facility] team listed Log into [email or web address] and use [place]'s universal password to access. If you do not have the password, please contact the hospital operator. Locate the [facility] provider you are looking for under [facility] and page to a number that you can be directly reached. (Text pages appreciated) If you still have difficulty reaching the provider, please page the [facility] (Director on Call) for the Hospitalists listed on amion for assistance.

## 2021-05-08 DIAGNOSIS — I2510 Atherosclerotic heart disease of native coronary artery without angina pectoris: Secondary | ICD-10-CM

## 2021-05-08 DIAGNOSIS — G20 Parkinson's disease: Secondary | ICD-10-CM | POA: Diagnosis not present

## 2021-05-08 DIAGNOSIS — R001 Bradycardia, unspecified: Secondary | ICD-10-CM | POA: Diagnosis not present

## 2021-05-08 DIAGNOSIS — R531 Weakness: Secondary | ICD-10-CM | POA: Diagnosis not present

## 2021-05-08 LAB — BLOOD CULTURE ID PANEL (REFLEXED) - BCID2

## 2021-05-08 LAB — CBC
HCT: 30.7 % — ABNORMAL LOW (ref 39.0–52.0)
Hemoglobin: 10.8 g/dL — ABNORMAL LOW (ref 13.0–17.0)
MCH: 34.4 pg — ABNORMAL HIGH (ref 26.0–34.0)
MCHC: 35.2 g/dL (ref 30.0–36.0)
MCV: 97.8 fL (ref 80.0–100.0)
Platelets: 129 10*3/uL — ABNORMAL LOW (ref 150–400)
RBC: 3.14 MIL/uL — ABNORMAL LOW (ref 4.22–5.81)
RDW: 12.5 % (ref 11.5–15.5)
WBC: 4.4 10*3/uL (ref 4.0–10.5)
nRBC: 0 % (ref 0.0–0.2)

## 2021-05-08 LAB — COMPREHENSIVE METABOLIC PANEL
ALT: 19 U/L (ref 0–44)
AST: 22 U/L (ref 15–41)
Albumin: 3.8 g/dL (ref 3.5–5.0)
Alkaline Phosphatase: 40 U/L (ref 38–126)
Anion gap: 5 (ref 5–15)
BUN: 17 mg/dL (ref 8–23)
CO2: 26 mmol/L (ref 22–32)
Calcium: 9 mg/dL (ref 8.9–10.3)
Chloride: 105 mmol/L (ref 98–111)
Creatinine, Ser: 1.74 mg/dL — ABNORMAL HIGH (ref 0.61–1.24)
GFR, Estimated: 40 mL/min — ABNORMAL LOW (ref >60)
Glucose, Bld: 97 mg/dL (ref 70–99)
Potassium: 4.1 mmol/L (ref 3.5–5.1)
Sodium: 136 mmol/L (ref 135–145)
Total Bilirubin: 1.3 mg/dL — ABNORMAL HIGH (ref 0.3–1.2)
Total Protein: 6.1 g/dL — ABNORMAL LOW (ref 6.5–8.1)

## 2021-05-08 LAB — URINE CULTURE: Culture: <10000 — AB

## 2021-05-08 LAB — LACTIC ACID, PLASMA: Lactic Acid, Venous: 0.6 mmol/L (ref 0.5–1.9)

## 2021-05-08 MED ORDER — AMLODIPINE BESYLATE 5 MG PO TABS
5.0000 mg | ORAL_TABLET | Freq: Every day | ORAL | Status: DC
  Administered 2021-05-08: 08:00:00 5 mg via ORAL
  Filled 2021-05-08: qty 1

## 2021-05-08 MED ORDER — AMLODIPINE BESYLATE 10 MG PO TABS
10.0000 mg | ORAL_TABLET | Freq: Every day | ORAL | Status: DC
  Administered 2021-05-09: 10:00:00 10 mg via ORAL
  Filled 2021-05-08: qty 1

## 2021-05-08 NOTE — Progress Notes (Signed)
Progress Note  Patient Name: Johnathan Arnold. Date of Encounter: 05/08/2021  [facility] Cardiologist: [name], MD   Subjective   Patient denies chest pain, shortness of breath, falls.  Inpatient Medications    Scheduled Meds:  [START ON 05/09/2021] amLODipine  10 mg Oral Daily   aspirin EC  81 mg Oral Daily   carbidopa-levodopa  1 tablet Oral QHS   carbidopa-levodopa  2 tablet Oral TID   enoxaparin (LOVENOX) injection  40 mg Subcutaneous Q24H   isosorbide mononitrate  60 mg Oral Daily   losartan  25 mg Oral Daily   polyethylene glycol  17 g Oral Daily   ranolazine  1,000 mg Oral BID   rosuvastatin  10 mg Oral QHS   tamsulosin  0.4 mg Oral Daily   Continuous Infusions:  PRN Meds: acetaminophen **OR** acetaminophen, bisacodyl, clonazePAM, nitroGLYCERIN   Vital Signs    Vitals:   05/07/21 2139 05/08/21 0526 05/08/21 0753 05/08/21 1119  BP: (!) 163/58 (!) 172/61 (!) 176/58 (!) 129/51  Pulse: (!) 56 (!) 52 (!) 52 62  Resp: 16 16 18 18   Temp: 98 F (36.7 C) 98.4 F (36.9 C) 98.4 F (36.9 C) 98.5 F (36.9 C)  TempSrc: Oral Oral Oral Oral  SpO2: 96% 98% 98% 97%  Weight:      Height:        Intake/Output Summary (Last 24 hours) at 05/08/2021 1353 Last data filed at 05/08/2021 1028 Gross per 24 hour  Intake 360 ml  Output 530 ml  Net -170 ml   Last 3 Weights 05/07/2021 03/08/2021 03/06/2021  Weight (lbs) 174 lb 178 lb 177 lb 9.6 oz  Weight (kg) 78.926 kg 80.74 kg 80.559 kg      Telemetry    Currently off telemetry, vitals with heart rate of 51- Personally Reviewed  ECG    No new ECG obtained- Personally Reviewed  Physical Exam   GEN: No acute distress.   Neck: No JVD Cardiac: Regular, bradycardic Respiratory: Clear to auscultation bilaterally. GI: Soft, nontender, non-distended  MS: No edema; No deformity. Neuro: Intentional tremors noted Psych: Normal affect   Labs    High Sensitivity Troponin:   Recent Labs  Lab  05/07/21 0534 05/07/21 0832  TROPONINIHS 7 7     Chemistry Recent Labs  Lab 05/07/21 0534 05/08/21 0456  NA 135 136  K 4.3 4.1  CL 100 105  CO2 28 26  GLUCOSE 121* 97  BUN 18 17  CREATININE 1.65* 1.74*  CALCIUM 9.2 9.0  PROT 6.6 6.1*  ALBUMIN 4.1 3.8  AST 24 22  ALT 8 19  ALKPHOS 47 40  BILITOT 1.2 1.3*  GFRNONAA 43* 40*  ANIONGAP 7 5    Lipids No results for input(s): CHOL, TRIG, HDL, LABVLDL, LDLCALC, CHOLHDL in the last 168 hours.  Hematology Recent Labs  Lab 05/07/21 0534 05/08/21 0456  WBC 4.3 4.4  RBC 3.38* 3.14*  HGB 11.8* 10.8*  HCT 33.7* 30.7*  MCV 99.7 97.8  MCH 34.9* 34.4*  MCHC 35.0 35.2  RDW 12.4 12.5  PLT 133* 129*   Thyroid  Recent Labs  Lab 05/07/21 0832  TSH 2.674    BNPNo results for input(s): BNP, PROBNP in the last 168 hours.  DDimer No results for input(s): DDIMER in the last 168 hours.   Radiology    DG Chest 2 View  Result Date: 05/07/2021 CLINICAL DATA:  Recent fall injury. EXAM: CHEST - 2 VIEW COMPARISON:  Chest CT  03/04/2021. FINDINGS: The cardiac size and vascular pattern are normal. There is calcification in the aortic arch and old CABG changes. The lungs are clear. The sulci are sharp. Mild osteopenia and thoracic spondylosis. IMPRESSION: No active cardiopulmonary disease.  Stable post CABG chest. Electronically Signed   By: Telford Nab M.D.   On: 05/07/2021 05:51   CT Head Wo Contrast  Result Date: 05/07/2021 CLINICAL DATA:  75 year old male status post fall striking head on dresser. EXAM: CT HEAD WITHOUT CONTRAST TECHNIQUE: Contiguous axial images were obtained from the base of the skull through the vertex without intravenous contrast. COMPARISON:  Brain MRI 01/15/2015.  Head CT 11/08/2020. FINDINGS: Brain: Stable cerebral volume. No midline shift, ventriculomegaly, mass effect, evidence of mass lesion, intracranial hemorrhage or evidence of cortically based acute infarction. Gray-white matter differentiation appears  stable and within normal limits for age. Vascular: Calcified atherosclerosis at the skull base. No suspicious intracranial vascular hyperdensity. Skull: Stable, intact. Sinuses/Orbits: Visualized paranasal sinuses and mastoids are stable and well aerated. Other: No discrete orbit or scalp soft tissue injury identified. IMPRESSION: 1. No acute traumatic injury identified. 2. Stable and negative for age non contrast CT appearance of the brain. Electronically Signed   By: Genevie Ann M.D.   On: 05/07/2021 05:13   CT Chest Wo Contrast  Result Date: 05/07/2021 CLINICAL DATA:  Respiratory illness, cough, history of aspiration EXAM: CT CHEST WITHOUT CONTRAST TECHNIQUE: Multidetector CT imaging of the chest was performed following the standard protocol without IV contrast. COMPARISON:  03/04/2021, 11/21/2020 FINDINGS: Cardiovascular: Aortic atherosclerosis. Normal heart size. Extensive 3 vessel coronary artery calcifications and stents status post median sternotomy and CABG. No pericardial effusion. Mediastinum/Nodes: No enlarged mediastinal, hilar, or axillary lymph nodes. Thyroid gland, trachea, and esophagus demonstrate no significant findings. Lungs/Pleura: Occasional small pulmonary nodules are stable and benign, for example a 0.7 cm nodule of the posterior right apex (series 3, image 25) and a 0.4 cm nodule of the superior segment right lower lobe (series 3, image 80). No pleural effusion or pneumothorax. Upper Abdomen: No acute abnormality. Musculoskeletal: No chest wall mass or suspicious bone lesions identified. IMPRESSION: 1. No acute airspace disease. 2. Occasional small pulmonary nodules are stable and benign. No further follow-up or characterization is required for these nodules. 3. Coronary artery disease. Aortic Atherosclerosis (ICD10-I70.0). Electronically Signed   By: Delanna Ahmadi M.D.   On: 05/07/2021 08:45   MR BRAIN WO CONTRAST  Result Date: 05/07/2021 CLINICAL DATA:  Dizziness. Fall with head  injury. Parkinson's disease. EXAM: MRI HEAD WITHOUT CONTRAST TECHNIQUE: Multiplanar, multiecho pulse sequences of the brain and surrounding structures were obtained without intravenous contrast. COMPARISON:  Head CT earlier same day.  MRI 01/15/2015. FINDINGS: Brain: The study suffers from some motion degradation. Diffusion imaging does not show any acute or subacute infarction. The brainstem and cerebellum are normal. Cerebral hemispheres show a few scattered punctate foci of T2 and FLAIR signal within the white matter consistent with mild chronic small vessel change. No cortical or large vessel territory infarction. No mass lesion, hemorrhage, hydrocephalus or extra-axial collection. Vascular: Major vessels at the base of the brain show flow. Skull and upper cervical spine: Negative Sinuses/Orbits: Clear/normal Other: None IMPRESSION: No acute or reversible finding. Mild chronic small-vessel ischemic changes of the cerebral hemispheric white matter, slightly progressive since 2016. Electronically Signed   By: Nelson Chimes M.D.   On: 05/07/2021 09:55   US Carotid Bilateral  Result Date: 05/07/2021 CLINICAL DATA:  Syncope EXAM: BILATERAL CAROTID DUPLEX ULTRASOUND  TECHNIQUE: Pearline Cables scale imaging, color Doppler and duplex ultrasound were performed of bilateral carotid and vertebral arteries in the neck. COMPARISON:  Ultrasound 01/15/2015 FINDINGS: Criteria: Quantification of carotid stenosis is based on velocity parameters that correlate the residual internal carotid diameter with NASCET-based stenosis levels, using the diameter of the distal internal carotid lumen as the denominator for stenosis measurement. The following velocity measurements were obtained: RIGHT ICA: 147 cm/sec CCA: 90 cm/sec SYSTOLIC ICA/CCA RATIO:  1.6 ECA: 194 cm/sec LEFT ICA: 123 cm/sec CCA: 89 cm/sec SYSTOLIC ICA/CCA RATIO:  1.5 ECA: 232 cm/sec RIGHT CAROTID ARTERY: Moderate mostly calcified plaque at the right carotid bulb extending into  the origin and proximal aspect of the right internal carotid artery. Mostly echogenic at the origin of the right external carotid artery with elevation peak systolic velocity. Estimated less than 50% stenosis of the right ICA. RIGHT VERTEBRAL ARTERY:  Antegrade flow is present LEFT CAROTID ARTERY: Moderate mixed echogenicity plaque at the left carotid bulb and extending into the origin of the left internal carotid artery. Estimated less than 50% stenosis of the left ICA. LEFT VERTEBRAL ARTERY: Antegrade flow within the left vertebral artery. IMPRESSION: 1. No hemodynamically significant stenosis or occlusive disease within either internal carotid artery. 2. 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   On: 05/07/2021 18:38    Cardiac Studies   TTEcho 01/2021 1. Left ventricular ejection fraction, by estimation, is 60 to 65%. The  left ventricle has normal function. The left ventricle has no regional  wall motion abnormalities. Left ventricular diastolic parameters are  consistent with Grade II diastolic  dysfunction (pseudonormalization).   2. Right ventricular systolic function is low normal. The right  ventricular size is normal. There is moderately elevated pulmonary artery  systolic pressure. The estimated right ventricular systolic pressure is  32.3 mmHg.   3. Left atrial size was mildly dilated.   4. The mitral valve is normal in structure. Mild mitral valve  regurgitation.   5. The aortic valve is tricuspid. Aortic valve regurgitation is not  visualized.   6. The inferior vena cava is dilated in size with >50% respiratory  variability, suggesting right atrial pressure of 8 mmHg.   Patient Profile     75 y.o. male history of CAD/CABG x4, Parkinson's disease, OSA presenting with fall being seen for bradycardia  Assessment & Plan    1.  Sinus bradycardia -Continue to hold beta-blocker -Etiology for fall  likely from Parkinson's. -Pacemaker not indicated.  2.  Hypertension -BP elevated.  Start Norvasc 5 mg daily -Continue losartan  3.  CAD/CABG -Denies chest pain.  Continue aspirin, Crestor.  4.  Parkinson's disease, tremors -Management as per medicine team  Patient can be discharged from a cardiac perspective on current medications.  Close follow-up as outpatient advised.  Please let us know if additional input is needed.  Cardiology will sign off.  Total encounter time 35 minutes  Greater than 50% was spent in counseling and coordination of care with the patient    Signed, Kate Sable, MD  05/08/2021, 1:53 PM

## 2021-05-08 NOTE — Progress Notes (Signed)
PHARMACY - PHYSICIAN COMMUNICATION CRITICAL VALUE ALERT - BLOOD CULTURE IDENTIFICATION (BCID)  Johnathan Arnold. is an 75 y.o. male who presented to [facility] on 05/07/2021 with a chief complaint of fall/hitting head, gradual progressive weakness  Assessment:  blood cultures from 11/29 with GPC in 1/4 bottles currently.  BCID detects staphylococcus species  Name of physician (or Provider) Contacted: Dr Serita Grit  Current antibiotics: none  Changes to prescribed antibiotics recommended:  Patient is on recommended antibiotics - No changes needed - no antibiotics needed at this time as suspect contaminant.  Will repeat blood culture as per physician request  Results for orders placed or performed during the hospital encounter of 05/07/21  Blood Culture ID Panel (Reflexed) (Collected: 05/07/2021  6:47 AM)  Result Value Ref Range   Enterococcus faecalis NOT DETECTED NOT DETECTED   Enterococcus Faecium NOT DETECTED NOT DETECTED   Listeria monocytogenes NOT DETECTED NOT DETECTED   Staphylococcus species DETECTED (A) NOT DETECTED   Staphylococcus aureus (BCID) NOT DETECTED NOT DETECTED   Staphylococcus epidermidis NOT DETECTED NOT DETECTED   Staphylococcus lugdunensis NOT DETECTED NOT DETECTED   Streptococcus species NOT DETECTED NOT DETECTED   Streptococcus agalactiae NOT DETECTED NOT DETECTED   Streptococcus pneumoniae NOT DETECTED NOT DETECTED   Streptococcus pyogenes NOT DETECTED NOT DETECTED   A.calcoaceticus-baumannii NOT DETECTED NOT DETECTED   Bacteroides fragilis NOT DETECTED NOT DETECTED   Enterobacterales NOT DETECTED NOT DETECTED   Enterobacter cloacae complex NOT DETECTED NOT DETECTED   Escherichia coli NOT DETECTED NOT DETECTED   Klebsiella aerogenes NOT DETECTED NOT DETECTED   Klebsiella oxytoca NOT DETECTED NOT DETECTED   Klebsiella pneumoniae NOT DETECTED NOT DETECTED   Proteus species NOT DETECTED NOT DETECTED   Salmonella species NOT DETECTED NOT DETECTED    Serratia marcescens NOT DETECTED NOT DETECTED   Haemophilus influenzae NOT DETECTED NOT DETECTED   Neisseria meningitidis NOT DETECTED NOT DETECTED   Pseudomonas aeruginosa NOT DETECTED NOT DETECTED   Stenotrophomonas maltophilia NOT DETECTED NOT DETECTED   Candida albicans NOT DETECTED NOT DETECTED   Candida auris NOT DETECTED NOT DETECTED   Candida glabrata NOT DETECTED NOT DETECTED   Candida krusei NOT DETECTED NOT DETECTED   Candida parapsilosis NOT DETECTED NOT DETECTED   Candida tropicalis NOT DETECTED NOT DETECTED   Cryptococcus neoformans/gattii NOT DETECTED NOT DETECTED   Doreene Eland, PharmD, BCPS, BCIDP Work Cell: [phone] 05/08/2021 11:26 AM

## 2021-05-08 NOTE — Progress Notes (Addendum)
[place] [facility] [place] NAME: Dnaiel Voller    MR#:  [id number]  DATE OF BIRTH:  12-06-1945  SUBJECTIVE:  patient came in with a episode of fall at home while trying to get out of bed at 2 AM in the morning. Denies any chest pain shortness of breath vertigo symptoms at that time. Has history of chronic bradycardia. No cough or dysuria. No fever. Family at bedside.   REVIEW OF SYSTEMS:   Review of Systems  Constitutional:  Negative for chills, fever and weight loss.  HENT:  Negative for ear discharge, ear pain and nosebleeds.   Eyes:  Negative for blurred vision, pain and discharge.  Respiratory:  Negative for sputum production, shortness of breath, wheezing and stridor.   Cardiovascular:  Negative for chest pain, palpitations, orthopnea and PND.  Gastrointestinal:  Negative for abdominal pain, diarrhea, nausea and vomiting.  Genitourinary:  Negative for frequency and urgency.  Musculoskeletal:  Negative for back pain and joint pain.  Neurological:  Positive for tremors and weakness. Negative for sensory change, speech change and focal weakness.  Psychiatric/Behavioral:  Negative for depression and hallucinations. The patient is not nervous/anxious.   Tolerating Diet: Tolerating PT:   DRUG ALLERGIES:  No Known Allergies  VITALS:  Blood pressure (!) 129/51, pulse 62, temperature 98.5 F (36.9 C), temperature source Oral, resp. rate 18, height 5\' 6"  (1.676 m), weight 78.9 kg, SpO2 97 %.  PHYSICAL EXAMINATION:   Physical Exam  GENERAL:  75 y.o.-year-old patient lying in the bed with no acute distress.  HEENT: Head atraumatic, normocephalic. Oropharynx and nasopharynx clear.  LUNGS: Normal breath sounds bilaterally, no wheezing, rales, rhonchi. No use of accessory muscles of respiration.  CARDIOVASCULAR: S1, S2 normal. No murmurs, rubs, or gallops.  ABDOMEN: Soft, nontender, nondistended. Bowel sounds present. No organomegaly or mass.   EXTREMITIES: No cyanosis, clubbing or edema b/l.    NEUROLOGIC: nonfocal, rest tremors+ PSYCHIATRIC:  patient is alert and oriented x 3.  SKIN: No obvious rash, lesion, or ulcer.   LABORATORY PANEL:  CBC Recent Labs  Lab 05/08/21 0456  WBC 4.4  HGB 10.8*  HCT 30.7*  PLT 129*    Chemistries  Recent Labs  Lab 05/08/21 0456  NA 136  K 4.1  CL 105  CO2 26  GLUCOSE 97  BUN 17  CREATININE 1.74*  CALCIUM 9.0  AST 22  ALT 19  ALKPHOS 40  BILITOT 1.3*   Cardiac Enzymes No results for input(s): TROPONINI in the last 168 hours. RADIOLOGY:  DG Chest 2 View  Result Date: 05/07/2021 CLINICAL DATA:  Recent fall injury. EXAM: CHEST - 2 VIEW COMPARISON:  Chest CT 03/04/2021. FINDINGS: The cardiac size and vascular pattern are normal. There is calcification in the aortic arch and old CABG changes. The lungs are clear. The sulci are sharp. Mild osteopenia and thoracic spondylosis. IMPRESSION: No active cardiopulmonary disease.  Stable post CABG chest. Electronically Signed   By: Telford Nab M.D.   On: 05/07/2021 05:51   CT Head Wo Contrast  Result Date: 05/07/2021 CLINICAL DATA:  75 year old male status post fall striking head on dresser. EXAM: CT HEAD WITHOUT CONTRAST TECHNIQUE: Contiguous axial images were obtained from the base of the skull through the vertex without intravenous contrast. COMPARISON:  Brain MRI 01/15/2015.  Head CT 11/08/2020. FINDINGS: Brain: Stable cerebral volume. No midline shift, ventriculomegaly, mass effect, evidence of mass lesion, intracranial hemorrhage or evidence of cortically based acute infarction. Gray-white matter  differentiation appears stable and within normal limits for age. Vascular: Calcified atherosclerosis at the skull base. No suspicious intracranial vascular hyperdensity. Skull: Stable, intact. Sinuses/Orbits: Visualized paranasal sinuses and mastoids are stable and well aerated. Other: No discrete orbit or scalp soft tissue injury  identified. IMPRESSION: 1. No acute traumatic injury identified. 2. Stable and negative for age non contrast CT appearance of the brain. Electronically Signed   By: Genevie Ann M.D.   On: 05/07/2021 05:13   CT Chest Wo Contrast  Result Date: 05/07/2021 CLINICAL DATA:  Respiratory illness, cough, history of aspiration EXAM: CT CHEST WITHOUT CONTRAST TECHNIQUE: Multidetector CT imaging of the chest was performed following the standard protocol without IV contrast. COMPARISON:  03/04/2021, 11/21/2020 FINDINGS: Cardiovascular: Aortic atherosclerosis. Normal heart size. Extensive 3 vessel coronary artery calcifications and stents status post median sternotomy and CABG. No pericardial effusion. Mediastinum/Nodes: No enlarged mediastinal, hilar, or axillary lymph nodes. Thyroid gland, trachea, and esophagus demonstrate no significant findings. Lungs/Pleura: Occasional small pulmonary nodules are stable and benign, for example a 0.7 cm nodule of the posterior right apex (series 3, image 25) and a 0.4 cm nodule of the superior segment right lower lobe (series 3, image 80). No pleural effusion or pneumothorax. Upper Abdomen: No acute abnormality. Musculoskeletal: No chest wall mass or suspicious bone lesions identified. IMPRESSION: 1. No acute airspace disease. 2. Occasional small pulmonary nodules are stable and benign. No further follow-up or characterization is required for these nodules. 3. Coronary artery disease. Aortic Atherosclerosis (ICD10-I70.0). Electronically Signed   By: Delanna Ahmadi M.D.   On: 05/07/2021 08:45   MR BRAIN WO CONTRAST  Result Date: 05/07/2021 CLINICAL DATA:  Dizziness. Fall with head injury. Parkinson's disease. EXAM: MRI HEAD WITHOUT CONTRAST TECHNIQUE: Multiplanar, multiecho pulse sequences of the brain and surrounding structures were obtained without intravenous contrast. COMPARISON:  Head CT earlier same day.  MRI 01/15/2015. FINDINGS: Brain: The study suffers from some motion  degradation. Diffusion imaging does not show any acute or subacute infarction. The brainstem and cerebellum are normal. Cerebral hemispheres show a few scattered punctate foci of T2 and FLAIR signal within the white matter consistent with mild chronic small vessel change. No cortical or large vessel territory infarction. No mass lesion, hemorrhage, hydrocephalus or extra-axial collection. Vascular: Major vessels at the base of the brain show flow. Skull and upper cervical spine: Negative Sinuses/Orbits: Clear/normal Other: None IMPRESSION: No acute or reversible finding. Mild chronic small-vessel ischemic changes of the cerebral hemispheric white matter, slightly progressive since 2016. Electronically Signed   By: Nelson Chimes M.D.   On: 05/07/2021 09:55   US Carotid Bilateral  Result Date: 05/07/2021 CLINICAL DATA:  Syncope EXAM: BILATERAL CAROTID DUPLEX ULTRASOUND TECHNIQUE: Pearline Cables scale imaging, color Doppler and duplex ultrasound were performed of bilateral carotid and vertebral arteries in the neck. COMPARISON:  Ultrasound 01/15/2015 FINDINGS: Criteria: Quantification of carotid stenosis is based on velocity parameters that correlate the residual internal carotid diameter with NASCET-based stenosis levels, using the diameter of the distal internal carotid lumen as the denominator for stenosis measurement. The following velocity measurements were obtained: RIGHT ICA: 147 cm/sec CCA: 90 cm/sec SYSTOLIC ICA/CCA RATIO:  1.6 ECA: 194 cm/sec LEFT ICA: 123 cm/sec CCA: 89 cm/sec SYSTOLIC ICA/CCA RATIO:  1.5 ECA: 232 cm/sec RIGHT CAROTID ARTERY: Moderate mostly calcified plaque at the right carotid bulb extending into the origin and proximal aspect of the right internal carotid artery. Mostly echogenic at the origin of the right external carotid artery with elevation peak systolic velocity.  Estimated less than 50% stenosis of the right ICA. RIGHT VERTEBRAL ARTERY:  Antegrade flow is present LEFT CAROTID ARTERY:  Moderate mixed echogenicity plaque at the left carotid bulb and extending into the origin of the left internal carotid artery. Estimated less than 50% stenosis of the left ICA. LEFT VERTEBRAL ARTERY: Antegrade flow within the left vertebral artery. IMPRESSION: 1. No hemodynamically significant stenosis or occlusive disease within either internal carotid artery. 2. 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   On: 05/07/2021 18:38   ASSESSMENT AND PLAN:  Trevis Eden. is a 75 y.o. male with a PMH significant for Parkinson's disease, HTN, Type II DM, CAD s/p CABG, HLD, obesity, vertigo, bilateral carotid stenosis, h/o skin cancer, hard of hearing, chronic/stable lung nodules, sleep apnea, anemia.  Generalized weakness  Fall, initial encounter- multifactorial. Patient has multiple medications contributing to fall risk (diazepam, clonazepam) as well as orthostatic agents (carvedilol, lasix) and has a history of vertigo and bradycardia.  -- Head CT/brain MRI negative.  --CT Chest neg PE and PNA - orthostatic vital signs stable - PT/OT--HHPT--pt prefers out PT-- patient ambulated with physical therapy 700 feet today without any difficulty - d/ced carvedilol by cardiology --holding lasix, and diazepam - fall precautions   Bradycardia- chronic. HR 40s-50s this admission. ECG showing sinus rhythm. He denies typical anginal features.  - consult cardiology for potentially needing pacemaker - continuous cardiac tele HR 50-58 and 65-72 with ambulation - d/ced carvedilol by [facility] cards   HTN  HLD  CAD s/p CABG  bilateral carotid stenosis --1-39% stenosis bilateral carotids in 2017. Last echo 01/2021. Denies chest pain - cont losartan, amlodipine - continue statin - carotid duplex --no significant stenosis   Parkinson's disease- chronic, stable - continue home meds   Type II DM- in remission. Last hemoglobin A1c was 5.3  8/22. - monitor blood sugars on regular labs   Chronic normocytic anemia  thrombocytopenia- no signifcant change from baseline.   1/4 BC positive for staph-- likely contamination -- no clinical signs symptoms of infection. White count normal. No fever. -- Discussed with ID pharmacist will repeat blood cultures today     DVT prophylaxis: enoxaparin (LOVENOX) injection 40 mg Start: 05/07/21 1000     Code Status: full  Family Communication: wife and son at bedside  Disposition Plan: to home tomorrow Consults called: cardiology   Level of care: Telemetry Medical Status is: Observation  The patient remains OBS appropriate and will d/c before 2 midnights.  Repeat BC drawn today      TOTAL TIME TAKING CARE OF THIS PATIENT: 25 minutes.  >50% time spent on counselling and coordination of care  Note: This dictation was prepared with Dragon dictation along with smaller phrase technology. Any transcriptional errors that result from this process are unintentional.  Fritzi Mandes M.D    [facility]   CC: Primary care physician; [name], MD Patient ID: [name]., male   DOB: May 26, 1946, 75 y.o.   MRN: [id number]

## 2021-05-08 NOTE — TOC Initial Note (Signed)
Transition of Care (TOC) - Initial/Assessment Note    Patient Details  Name: Johnathan Arnold. MRN: [id number] Date of Birth: 14-Sep-1945  Transition of Care [facility]) CM/SW Contact:    Shelbie Hutching, RN Phone Number: 05/08/2021, 10:33 AM  Clinical Narrative:                 Patient placed under observation after a fall and hitting his head.  RNCM spoke with patient and patient's wife via phone.  Patient is from home with his wife, independent at home and drives.  He is current with his PCP and gets prescriptions from [facility].  PT and OT have recommended home health.  Patient has been going to OP therapy at [facility] and would prefer to continue with that so he can get out of the house a little bit.      Expected Discharge Plan: OP Rehab Barriers to Discharge: Continued Medical Work up   Patient Goals and CMS Choice Patient states their goals for this hospitalization and ongoing recovery are:: Patient wants to return home and go to OP PT and OT at [email or web address] Compare Post Acute Care list provided to:: Patient Choice offered to / list presented to : Patient  Expected Discharge Plan and Services Expected Discharge Plan: OP Rehab   Discharge Planning Services: CM Consult   Living arrangements for the past 2 months: Single Family Home                 DME Arranged: N/A DME Agency: NA       HH Arranged: NA HH Agency: NA        Prior Living Arrangements/Services Living arrangements for the past 2 months: Single Family Home Lives with:: Spouse Patient language and need for interpreter reviewed:: Yes Do you feel safe going back to the place where you live?: Yes      Need for Family Participation in Patient Care: Yes (Comment) Care giver support system in place?: Yes (comment) (wife)   Criminal Activity/Legal Involvement Pertinent to Current Situation/Hospitalization: No - Comment as needed  Activities of Daily Living      Permission Sought/Granted Permission  sought to share information with : Case Manager, Family Supports, Other (comment) Permission granted to share information with : Yes, Verbal Permission Granted  Share Information with NAME: Andrea Colglazier  Permission granted to share info w AGENCY: [facility] granted to share info w Relationship: spouse  Permission granted to share info w Contact Information: [phone]  Emotional Assessment   Attitude/Demeanor/Rapport: Engaged Affect (typically observed): Accepting Orientation: : Oriented to Self, Oriented to Place, Oriented to  Time, Oriented to Situation Alcohol / Substance Use: Not Applicable Psych Involvement: No (comment)  Admission diagnosis:  Dehydration [E86.0] Syncope [R55] Generalized weakness [R53.1] Parkinson's disease ([place]) [G20] Injury of head, initial encounter [S09.90XA] Fall, initial encounter [W19.XXXA] Patient Active Problem List   Diagnosis Date Noted   Generalized weakness 05/07/2021   Fall    Head injury    Bradycardia    Anemia, unspecified 02/07/2021   Neutropenia ([place]) 01/29/2021   Thrombocytopenia ([place]) 01/29/2021   Hyponatremia 12/22/2020   Bilateral leg weakness 12/20/2020   Prostate cancer screening 09/08/2020   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[place]) 09/06/2020   Low back pain of over 3 months duration 08/19/2019   Pulmonary hypertension ([place])    Insomnia 12/21/2018   Sleep apnea in adult 12/09/2018   Periodic limb movement disorder 12/09/2018   Lung  nodule 07/09/2018   Pulmonary nodules 05/18/2018   Wears hearing aid in both ears 05/17/2018   Leg pain, bilateral 02/20/2018   Dyspnea    Effort angina (HCC)    CKD (chronic kidney disease) stage 3, GFR 30-59 ml/min (HCC) 09/21/2017   History of skin cancer in adulthood 08/14/2016   Parkinson's disease ([place]) 08/09/2016   Bilateral carotid artery stenosis 04/02/2015   Vertigo, peripheral 10/17/2014   Benign prostatic hypertrophy with urinary frequency  01/31/2014   Encounter for Medicare annual wellness exam 07/02/2013   Obesity 04/03/2013   Other malaise and fatigue 09/21/2012   Hyperlipidemia    Hypertension    3-vessel coronary artery disease    S/P CABG x 5    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

## 2021-05-08 NOTE — Care Management Obs Status (Signed)
[place] NOTIFICATION   Patient Details  Name: Johnathan Arnold. MRN: [id number] Date of Birth: 03/21/1946   Medicare Observation Status Notification Given:  Yes    Shelbie Hutching, RN 05/08/2021, 9:32 AM

## 2021-05-08 NOTE — Progress Notes (Signed)
Physical Therapy Treatment Patient Details Name: Johnathan Arnold. MRN: [id number] DOB: 1945/11/26 Today's Date: 05/08/2021   History of Present Illness 75 y.o. male with a hx of  with a hx of CAD s/p CABG in 1999, HTN, CKD, HLD, b/l carotid artery disease, Parkinson's, OSA on CPAP, DM2, moderate pulmonary HTN. Pt brought to the ED via EMS from home status post fall with head injury.    PT Comments    Pt received supine in bed, son in room and agreeable to therapy. All mobility has improved from previous session. Pt increased ambulation distance to 72ft with CGA provided for safety however no steadying required. HR monitored throughout ambulation - 65bpm at rest, elevated and steadied within the range of 70-75bpm during ambulation. PT encouraged pt and family to walk multiple times each day while in the hospital with family or nursing staff present in order to continue progressing overall strength and stability. Pt would benefit from higher level balance exercises and dual tasking with ambulation to address above deficits and promote optimal return to PLOF. PT changing d/c rec to OPPT.    Recommendations for follow up therapy are one component of a multi-disciplinary discharge planning process, led by the attending physician.  Recommendations may be updated based on patient status, additional functional criteria and insurance authorization.  Follow Up Recommendations  Outpatient PT     Assistance Recommended at Discharge Set up Supervision/Assistance  Equipment Recommendations  None recommended by PT    Recommendations for Other Services       Precautions / Restrictions Precautions Precautions: Fall Restrictions Weight Bearing Restrictions: No     Mobility  Bed Mobility Overal bed mobility: Modified Independent                  Transfers Overall transfer level: Needs assistance Equipment used: None Transfers: Sit to/from Stand Sit to Stand: Supervision            General transfer comment: SUP for safety.    Ambulation/Gait Ambulation/Gait assistance: Min guard Gait Distance (Feet): 700 Feet Assistive device: None Gait Pattern/deviations: Step-through pattern;WFL(Within Functional Limits);Trunk flexed       General Gait Details: 743ft in hallway. HR monitored - resting 65bpm, elevated and steadied to 70-75bpm during ambulation. Gait velocity increased compared to yesterday, no drifting. CGA for safety, no steadying required.   Stairs             Wheelchair Mobility    Modified Rankin (Stroke Patients Only)       Balance Overall balance assessment: Needs assistance Sitting-balance support: No upper extremity supported;Feet supported Sitting balance-Leahy Scale: Good     Standing balance support: During functional activity;No upper extremity supported Standing balance-Leahy Scale: Fair Standing balance comment: No apparent LOB in standing or with ambulation                            Cognition Arousal/Alertness: Awake/alert Behavior During Therapy: WFL for tasks assessed/performed Overall Cognitive Status: Within Functional Limits for tasks assessed                                          Exercises      General Comments        Pertinent Vitals/Pain Pain Assessment: No/denies pain    Home Living  Prior Function            PT Goals (current goals can now be found in the care plan section) Acute Rehab PT Goals Patient Stated Goal: to get stronger and improve balance PT Goal Formulation: With patient Time For Goal Achievement: 05/21/21 Potential to Achieve Goals: Good    Frequency    Min 2X/week      PT Plan      Co-evaluation              AM-PAC PT "6 Clicks" Mobility   Outcome Measure  Help needed turning from your back to your side while in a flat bed without using bedrails?: None Help needed moving from lying on your  back to sitting on the side of a flat bed without using bedrails?: None Help needed moving to and from a bed to a chair (including a wheelchair)?: A Little Help needed standing up from a chair using your arms (e.g., wheelchair or bedside chair)?: A Little Help needed to walk in hospital room?: A Little Help needed climbing 3-5 steps with a railing? : A Little 6 Click Score: 20    End of Session Equipment Utilized During Treatment: Gait belt Activity Tolerance: Patient tolerated treatment well Patient left: in bed;with call bell/phone within reach;with family/visitor present Nurse Communication: Mobility status PT Visit Diagnosis: Unsteadiness on feet (R26.81);Other abnormalities of gait and mobility (R26.89);Difficulty in walking, not elsewhere classified (R26.2)     Time: 1001-1018 PT Time Calculation (min) (ACUTE ONLY): 17 min  Charges:  $Therapeutic Exercise: 8-22 mins                     Patrina Levering PT, DPT 05/08/21 1:01 PM [phone]

## 2021-05-09 DIAGNOSIS — R531 Weakness: Secondary | ICD-10-CM | POA: Diagnosis not present

## 2021-05-09 MED ORDER — AMLODIPINE BESYLATE 10 MG PO TABS
10.0000 mg | ORAL_TABLET | Freq: Every day | ORAL | 1 refills | Status: DC
Start: 2021-05-10 — End: 2021-08-06

## 2021-05-09 NOTE — Discharge Summary (Signed)
[facility] NAME: Johnathan Arnold    MR#:  [id number]  DATE OF BIRTH:  1946-03-29  DATE OF ADMISSION:  05/07/2021 ADMITTING PHYSICIAN: Richarda Osmond, MD  DATE OF DISCHARGE: 05/09/2021  PRIMARY CARE PHYSICIAN: [name], MD    ADMISSION DIAGNOSIS:  Dehydration [E86.0] Syncope [R55] Generalized weakness [R53.1] Parkinson's disease ([place]) [G20] Injury of head, initial encounter [S09.90XA] Fall, initial encounter [W19.XXXA]  DISCHARGE DIAGNOSIS:  Mechanical fall w/o injury S bradycardia--improving  SECONDARY DIAGNOSIS:   Past Medical History:  Diagnosis Date   3-vessel coronary artery disease    s/p  5 vessel CABG   Diabetes mellitus without complication ([place])    History of cardiac catheterization 2011   [facility]   Hyperlipidemia    Hypertension    Hypertriglyceridemia    Parkinson's disease ([place])    Pneumonia 12/28/2020   S/P CABG x 5 11-99   Vertigo     HOSPITAL COURSE:  Johnathan Arnold. is a 75 y.o. male with a PMH significant for Parkinson's disease, HTN, Type II DM, CAD s/p CABG, HLD, obesity, vertigo, bilateral carotid stenosis, h/o skin cancer, hard of hearing, chronic/stable lung nodules, sleep apnea, anemia.   Generalized weakness  Fall, initial encounter- multifactorial. Patient has multiple medications contributing to fall risk (diazepam, clonazepam) as well as orthostatic agents (carvedilol, lasix) and has a history of vertigo and bradycardia.  -- Head CT/brain MRI negative.  --CT Chest neg PE and PNA - orthostatic vital signs stable - PT/OT--HHPT--pt prefers out PT-- patient ambulated with physical therapy 700 feet today without any difficulty - d/ced carvedilol by cardiology --holding lasix, and diazepam - fall precautions   Bradycardia- chronic. HR 40s-50s this admission. ECG showing sinus rhythm. He denies typical anginal features.  - consult cardiology for potentially needing pacemaker -  continuous cardiac tele HR 50-58 and 65-72 with ambulation - d/ced carvedilol by [facility] cards --HR 55-65   HTN  HLD  CAD s/p CABG  bilateral carotid stenosis --1-39% stenosis bilateral carotids in 2017. Last echo 01/2021. Denies chest pain - cont losartan, amlodipine - continue statin - carotid duplex --no significant stenosis   Parkinson's disease- chronic, stable - continue home meds   Type II DM- in remission. Last hemoglobin A1c was 5.3 8/22. - monitor blood sugars on regular labs   Chronic normocytic anemia  thrombocytopenia- no signifcant change from baseline.    1/4 BC positive for staph-- likely contamination -- no clinical signs symptoms of infection. White count normal. No fever. -- Discussed with ID pharmacist will repeat blood cultures today  --repeat bc so far negative     DVT prophylaxis: enoxaparin (LOVENOX) injection 40 mg Start: 05/07/21 1000     Code Status: full  Family Communication: son at bedside  Disposition Plan: to home tomorrow Consults called: cardiology   Level of care: Telemetry Medical Status is: Observation   Overall better will d/c home    CONSULTS OBTAINED:    DRUG ALLERGIES:  No Known Allergies  DISCHARGE MEDICATIONS:   Allergies as of 05/09/2021   No Known Allergies      Medication List     STOP taking these medications    carvedilol 3.125 MG tablet Commonly known as: COREG   diazepam 5 MG tablet Commonly known as: Valium       TAKE these medications    amLODipine 10 MG tablet Commonly known as: NORVASC Take 1 tablet (10 mg total) by mouth daily. Start  taking on: May 10, 2021   aspirin EC 81 MG tablet Take 1 tablet (81 mg total) by mouth at bedtime.   carbidopa-levodopa 50-200 MG tablet Commonly known as: SINEMET CR Take 1 tablet by mouth at bedtime.   carbidopa-levodopa 25-100 MG tablet Commonly known as: SINEMET IR Take 2 tablets by mouth 3 (three) times daily. 2 at 8am, 2 at 11am, 2 at 2 pm, 1 at  5pm   clonazePAM 0.5 MG tablet Commonly known as: KLONOPIN TAKE 1 AND 1/2 TABLET BY MOUTH EVERY NIGHT AT BEDTIME   furosemide 20 MG tablet Commonly known as: LASIX TAKE ONE TABLET BY MOUTH DAILY   isosorbide mononitrate 60 MG 24 hr tablet Commonly known as: IMDUR TAKE ONE TABLET BY MOUTH DAILY   losartan 25 MG tablet Commonly known as: COZAAR Take 1 tablet (25 mg total) by mouth daily.   melatonin 3 MG Tabs tablet Take 3 mg by mouth at bedtime.   multivitamin tablet Take 1 tablet by mouth daily.   nitroGLYCERIN 0.4 MG SL tablet Commonly known as: NITROSTAT Place 1 tablet (0.4 mg total) under the tongue every 5 (five) minutes as needed for chest pain.   omeprazole 20 MG capsule Commonly known as: PRILOSEC TAKE ONE CAPSULE BY MOUTH EVERY MORNING   polyethylene glycol 17 g packet Commonly known as: MIRALAX / GLYCOLAX Take 17 g by mouth daily.   ranolazine 1000 MG SR tablet Commonly known as: RANEXA Take 1 tablet (1,000 mg total) by mouth 2 (two) times daily.   rosuvastatin 10 MG tablet Commonly known as: CRESTOR Take 1 tablet (10 mg total) by mouth daily.   tamsulosin 0.4 MG Caps capsule Commonly known as: FLOMAX TAKE ONE CAPSULE BY MOUTH DAILY        If you experience worsening of your admission symptoms, develop shortness of breath, life threatening emergency, suicidal or homicidal thoughts you must seek medical attention immediately by calling 911 or calling your MD immediately  if symptoms less severe.  You Must read complete instructions/literature along with all the possible adverse reactions/side effects for all the Medicines you take and that have been prescribed to you. Take any new Medicines after you have completely understood and accept all the possible adverse reactions/side effects.   Please note  You were cared for by a hospitalist during your hospital stay. If you have any questions about your discharge medications or the care you received while  you were in the hospital after you are discharged, you can call the unit and asked to speak with the hospitalist on call if the hospitalist that took care of you is not available. Once you are discharged, your primary care physician will handle any further medical issues. Please note that NO REFILLS for any discharge medications will be authorized once you are discharged, as it is imperative that you return to your primary care physician (or establish a relationship with a primary care physician if you do not have one) for your aftercare needs so that they can reassess your need for medications and monitor your lab values. Today   SUBJECTIVE   No new complaints  VITAL SIGNS:  Blood pressure (!) 151/56, pulse 63, temperature 97.8 F (36.6 C), temperature source Oral, resp. rate 16, height 5\' 6"  (1.676 m), weight 78.9 kg, SpO2 99 %.  I/O:   Intake/Output Summary (Last 24 hours) at 05/09/2021 1048 Last data filed at 05/09/2021 1042 Gross per 24 hour  Intake 0 ml  Output --  Net 0  ml    PHYSICAL EXAMINATION:  GENERAL:  75 y.o.-year-old patient lying in the bed with no acute distress.  LUNGS: Normal breath sounds bilaterally, no wheezing, rales,rhonchi or crepitation. No use of accessory muscles of respiration.  CARDIOVASCULAR: S1, S2 normal. No murmurs, rubs, or gallops.  ABDOMEN: Soft, non-tender, non-distended. Bowel sounds present. No organomegaly or mass.  EXTREMITIES: No pedal edema, cyanosis, or clubbing.  NEUROLOGIC: non-focal PSYCHIATRIC:  patient is alert and oriented x 3.  SKIN: No obvious rash, lesion, or ulcer.   DATA REVIEW:   CBC  Recent Labs  Lab 05/08/21 0456  WBC 4.4  HGB 10.8*  HCT 30.7*  PLT 129*    Chemistries  Recent Labs  Lab 05/08/21 0456  NA 136  K 4.1  CL 105  CO2 26  GLUCOSE 97  BUN 17  CREATININE 1.74*  CALCIUM 9.0  AST 22  ALT 19  ALKPHOS 40  BILITOT 1.3*    Microbiology Results   Recent Results (from the past 240 hour(s))  Resp  Panel by RT-PCR (Flu A&B, Covid) Nasopharyngeal Swab     Status: None   Collection Time: 05/07/21  5:34 AM   Specimen: Nasopharyngeal Swab; Nasopharyngeal(NP) swabs in vial transport medium  Result Value Ref Range Status   SARS Coronavirus 2 by RT PCR NEGATIVE NEGATIVE Final    Comment: (NOTE) SARS-CoV-2 target nucleic acids are NOT DETECTED.  The SARS-CoV-2 RNA is generally detectable in upper respiratory specimens during the acute phase of infection. The lowest concentration of SARS-CoV-2 viral copies this assay can detect is 138 copies/mL. A negative result does not preclude SARS-Cov-2 infection and should not be used as the sole basis for treatment or other patient management decisions. A negative result may occur with  improper specimen collection/handling, submission of specimen other than nasopharyngeal swab, presence of viral mutation(s) within the areas targeted by this assay, and inadequate number of viral copies(<138 copies/mL). A negative result must be combined with clinical observations, patient history, and epidemiological information. The expected result is Negative.  Fact Sheet for Patients:  EntrepreneurPulse.com.au  Fact Sheet for Healthcare Providers:  IncredibleEmployment.be  This test is no t yet approved or cleared by the [place] FDA and  has been authorized for detection and/or diagnosis of SARS-CoV-2 by FDA under an Emergency Use Authorization (EUA). This EUA will remain  in effect (meaning this test can be used) for the duration of the COVID-19 declaration under Section 564(b)(1) of the Act, 21 U.S.C.section 360bbb-3(b)(1), unless the authorization is terminated  or revoked sooner.       Influenza A by PCR NEGATIVE NEGATIVE Final   Influenza B by PCR NEGATIVE NEGATIVE Final    Comment: (NOTE) The Xpert Xpress SARS-CoV-2/FLU/RSV plus assay is intended as an aid in the diagnosis of influenza from Nasopharyngeal  swab specimens and should not be used as a sole basis for treatment. Nasal washings and aspirates are unacceptable for Xpert Xpress SARS-CoV-2/FLU/RSV testing.  Fact Sheet for Patients: EntrepreneurPulse.com.au  Fact Sheet for Healthcare Providers: IncredibleEmployment.be  This test is not yet approved or cleared by the [place] FDA and has been authorized for detection and/or diagnosis of SARS-CoV-2 by FDA under an Emergency Use Authorization (EUA). This EUA will remain in effect (meaning this test can be used) for the duration of the COVID-19 declaration under Section 564(b)(1) of the Act, 21 U.S.C. section 360bbb-3(b)(1), unless the authorization is terminated or revoked.  Performed at [facility], [address], [place], [place] [address]  Urine Culture     Status: Abnormal   Collection Time: 05/07/21  5:34 AM   Specimen: Urine, Random  Result Value Ref Range Status   Specimen Description   Final    URINE, RANDOM Performed at [facility], [address] [place], [place] [address]    Special Requests   Final    NONE Performed at [facility], [address] [place], [place] [address]    Culture (A)  Final    <10,000 COLONIES/mL INSIGNIFICANT GROWTH Performed at [facility] [address], [place], [place] [address]    Report Status 05/08/2021 FINAL  Final  Culture, blood (routine x 2)     Status: None (Preliminary result)   Collection Time: 05/07/21  6:47 AM   Specimen: BLOOD  Result Value Ref Range Status   Specimen Description   Final    BLOOD RIGHT ASSIST CONTROL Performed at [facility], [address] [place], [place] [address]    Special Requests   Final    BOTTLES DRAWN AEROBIC AND ANAEROBIC Blood Culture adequate volume Performed at [facility], [address] [place], [place] [address]    Culture  Setup Time   Final    GRAM POSITIVE  COCCI Organism ID to follow AEROBIC BOTTLE ONLY CRITICAL RESULT CALLED TO, READ BACK BY AND VERIFIED WITH: Performed at [facility], [address] [place], [place] [address]    Culture   Final    Lonell Grandchild POSITIVE COCCI TOO YOUNG TO READ Performed at [facility], [place] [address] [place], [place] [address]    Report Status PENDING  Incomplete  Culture, blood (routine x 2)     Status: None (Preliminary result)   Collection Time: 05/07/21  6:47 AM   Specimen: BLOOD  Result Value Ref Range Status   Specimen Description BLOOD LEFT ASSIST CONTROL  Final   Special Requests   Final    BOTTLES DRAWN AEROBIC AND ANAEROBIC Blood Culture adequate volume   Culture   Final    NO GROWTH 2 DAYS Performed at [facility], [address] [place], [place] [address]    Report Status PENDING  Incomplete  Blood Culture ID Panel (Reflexed)     Status: Abnormal   Collection Time: 05/07/21  6:47 AM  Result Value Ref Range Status   Enterococcus faecalis NOT DETECTED NOT DETECTED Final   Enterococcus Faecium NOT DETECTED NOT DETECTED Final   Listeria monocytogenes NOT DETECTED NOT DETECTED Final   Staphylococcus species DETECTED (A) NOT DETECTED Final    Comment: CRITICAL RESULT CALLED TO, READ BACK BY AND VERIFIED WITH: SHAAMA HALLAJI 05/08/21 1055 KLW    Staphylococcus aureus (BCID) NOT DETECTED NOT DETECTED Final   Staphylococcus epidermidis NOT DETECTED NOT DETECTED Final   Staphylococcus lugdunensis NOT DETECTED NOT DETECTED Final   Streptococcus species NOT DETECTED NOT DETECTED Final   Streptococcus agalactiae NOT DETECTED NOT DETECTED Final   Streptococcus pneumoniae NOT DETECTED NOT DETECTED Final   Streptococcus pyogenes NOT DETECTED NOT DETECTED Final   A.calcoaceticus-baumannii NOT DETECTED NOT DETECTED Final   Bacteroides fragilis NOT DETECTED NOT DETECTED Final   Enterobacterales NOT DETECTED NOT DETECTED Final   Enterobacter cloacae complex NOT DETECTED NOT  DETECTED Final   Escherichia coli NOT DETECTED NOT DETECTED Final   Klebsiella aerogenes NOT DETECTED NOT DETECTED Final   Klebsiella oxytoca NOT DETECTED NOT DETECTED Final   Klebsiella pneumoniae NOT DETECTED NOT DETECTED Final   Proteus  species NOT DETECTED NOT DETECTED Final   Salmonella species NOT DETECTED NOT DETECTED Final   Serratia marcescens NOT DETECTED NOT DETECTED Final   Haemophilus influenzae NOT DETECTED NOT DETECTED Final   Neisseria meningitidis NOT DETECTED NOT DETECTED Final   Pseudomonas aeruginosa NOT DETECTED NOT DETECTED Final   Stenotrophomonas maltophilia NOT DETECTED NOT DETECTED Final   Candida albicans NOT DETECTED NOT DETECTED Final   Candida auris NOT DETECTED NOT DETECTED Final   Candida glabrata NOT DETECTED NOT DETECTED Final   Candida krusei NOT DETECTED NOT DETECTED Final   Candida parapsilosis NOT DETECTED NOT DETECTED Final   Candida tropicalis NOT DETECTED NOT DETECTED Final   Cryptococcus neoformans/gattii NOT DETECTED NOT DETECTED Final    Comment: Performed at [facility], [address] [place], [place] [address]  CULTURE, BLOOD (ROUTINE X 2) w Reflex to ID Panel     Status: None (Preliminary result)   Collection Time: 05/08/21 11:40 AM   Specimen: BLOOD  Result Value Ref Range Status   Specimen Description BLOOD RIGHT ANTECUBITAL  Final   Special Requests   Final    BOTTLES DRAWN AEROBIC AND ANAEROBIC Blood Culture adequate volume   Culture   Final    NO GROWTH < 24 HOURS Performed at [facility], [address] [place], [place]  [address]    Report Status PENDING  Incomplete  CULTURE, BLOOD (ROUTINE X 2) w Reflex to ID Panel     Status: None (Preliminary result)   Collection Time: 05/08/21 11:42 AM   Specimen: BLOOD  Result Value Ref Range Status   Specimen Description BLOOD LEFT ANTECUBITAL  Final   Special Requests   Final    BOTTLES DRAWN AEROBIC AND ANAEROBIC Blood Culture adequate volume   Culture   Final     NO GROWTH < 24 HOURS Performed at [facility], [address] [place], [place] [address]    Report Status PENDING  Incomplete    RADIOLOGY:  US Carotid Bilateral  Result Date: 05/07/2021 CLINICAL DATA:  Syncope EXAM: BILATERAL CAROTID DUPLEX ULTRASOUND TECHNIQUE: Pearline Cables scale imaging, color Doppler and duplex ultrasound were performed of bilateral carotid and vertebral arteries in the neck. COMPARISON:  Ultrasound 01/15/2015 FINDINGS: Criteria: Quantification of carotid stenosis is based on velocity parameters that correlate the residual internal carotid diameter with NASCET-based stenosis levels, using the diameter of the distal internal carotid lumen as the denominator for stenosis measurement. The following velocity measurements were obtained: RIGHT ICA: 147 cm/sec CCA: 90 cm/sec SYSTOLIC ICA/CCA RATIO:  1.6 ECA: 194 cm/sec LEFT ICA: 123 cm/sec CCA: 89 cm/sec SYSTOLIC ICA/CCA RATIO:  1.5 ECA: 232 cm/sec RIGHT CAROTID ARTERY: Moderate mostly calcified plaque at the right carotid bulb extending into the origin and proximal aspect of the right internal carotid artery. Mostly echogenic at the origin of the right external carotid artery with elevation peak systolic velocity. Estimated less than 50% stenosis of the right ICA. RIGHT VERTEBRAL ARTERY:  Antegrade flow is present LEFT CAROTID ARTERY: Moderate mixed echogenicity plaque at the left carotid bulb and extending into the origin of the left internal carotid artery. Estimated less than 50% stenosis of the left ICA. LEFT VERTEBRAL ARTERY: Antegrade flow within the left vertebral artery. IMPRESSION: 1. No hemodynamically significant stenosis or occlusive disease within either internal carotid artery. 2. 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   On: 05/07/2021 18:38     CODE  STATUS:     Code Status Orders  (From admission, onward)           Start      Ordered   05/07/21 0957  Full code  Continuous        05/07/21 0958           Code Status History     Date Active Date Inactive Code Status Order ID Comments User Context   02/01/2018 1151 02/01/2018 1615 Full Code [id number]  Wellington Hampshire, MD Inpatient        TOTAL TIME TAKING CARE OF THIS PATIENT: 40 minutes.    Fritzi Mandes M.D  [facility]    CC: Primary care physician; [name], MD

## 2021-05-10 ENCOUNTER — Ambulatory Visit: Payer: Medicare Other | Admitting: Physical Therapy

## 2021-05-10 ENCOUNTER — Telehealth: Payer: Self-pay

## 2021-05-10 LAB — CULTURE, BLOOD (ROUTINE X 2): Special Requests: ADEQUATE

## 2021-05-10 NOTE — Telephone Encounter (Signed)
Transition Care Management Follow-up Telephone Call Date of discharge and from where: 05/09/21-[facility] How have you been since you were released from the hospital? "I am doing okay, everything is back to normal the way it was before  the fall." Denies pain, vertigo, dizziness, pain, headache, blurred vision and all other symptoms.  Any questions or concerns? No vertigo since discharge. Notes vertigo has been intermittent for about a month.   Items Reviewed: Did the pt receive and understand the discharge instructions provided? Yes  Medications obtained and verified? Yes . Taking as directed.  Any new allergies since your discharge? No  Dietary orders reviewed? Yes Do you have support at home? Yes   Home Care and Equipment/Supplies: Were home health services ordered? no  Functional Questionnaire: (I = Independent and D = Dependent) ADLs: I  Follow up appointments reviewed:  PCP Hospital f/u appt confirmed? Yes  Scheduled to see PCP on 05/16/21 @ 11:00. [facility] f/u appt confirmed?  Agrees to schedule with cardiology. Are transportation arrangements needed? No  If their condition worsens, is the pt aware to call PCP or go to the Emergency Dept.? Yes Was the patient provided with contact information for the PCP's office or ED? Yes Was to pt encouraged to call back with questions or concerns? Yes

## 2021-05-12 LAB — CULTURE, BLOOD (ROUTINE X 2)
Culture: NO GROWTH
Special Requests: ADEQUATE

## 2021-05-13 LAB — CULTURE, BLOOD (ROUTINE X 2)
Culture: NO GROWTH
Culture: NO GROWTH
Special Requests: ADEQUATE
Special Requests: ADEQUATE

## 2021-05-14 ENCOUNTER — Ambulatory Visit: Payer: Medicare Other | Admitting: Physical Therapy

## 2021-05-16 ENCOUNTER — Ambulatory Visit (INDEPENDENT_AMBULATORY_CARE_PROVIDER_SITE_OTHER): Payer: Medicare Other | Admitting: Internal Medicine

## 2021-05-16 ENCOUNTER — Encounter: Payer: Self-pay | Admitting: Internal Medicine

## 2021-05-16 ENCOUNTER — Other Ambulatory Visit: Payer: Self-pay

## 2021-05-16 VITALS — BP 138/52 | HR 60 | Temp 96.0°F | Ht 66.0 in | Wt 178.6 lb

## 2021-05-16 DIAGNOSIS — E1121 Type 2 diabetes mellitus with diabetic nephropathy: Secondary | ICD-10-CM | POA: Diagnosis not present

## 2021-05-16 DIAGNOSIS — I208 Other forms of angina pectoris: Secondary | ICD-10-CM

## 2021-05-16 DIAGNOSIS — R29898 Other symptoms and signs involving the musculoskeletal system: Secondary | ICD-10-CM

## 2021-05-16 DIAGNOSIS — Z23 Encounter for immunization: Secondary | ICD-10-CM

## 2021-05-16 DIAGNOSIS — Z09 Encounter for follow-up examination after completed treatment for conditions other than malignant neoplasm: Secondary | ICD-10-CM | POA: Insufficient documentation

## 2021-05-16 DIAGNOSIS — D696 Thrombocytopenia, unspecified: Secondary | ICD-10-CM

## 2021-05-16 DIAGNOSIS — R001 Bradycardia, unspecified: Secondary | ICD-10-CM | POA: Diagnosis not present

## 2021-05-16 DIAGNOSIS — G20 Parkinson's disease: Secondary | ICD-10-CM

## 2021-05-16 NOTE — Patient Instructions (Signed)
Please try to increase your water intake to 30 ounces daily.  FLAVOR it if you have to   I recommend keeping a walker by your bed   Your leg circulation is fine; it was not the cause of your fall.

## 2021-05-16 NOTE — Assessment & Plan Note (Signed)
He had a fall recently due to legs freezing and giving way.  Walker advised

## 2021-05-16 NOTE — Assessment & Plan Note (Signed)
Managed with ER Sinemet qhs.  Attending [employer] program 3 days per week . Recent fall seems to have occurred in part due to freezing of legs .  Advised to keep a walker near the bed. Marland Kitchen

## 2021-05-16 NOTE — Progress Notes (Signed)
Subjective:  Patient ID: Johnathan Arnold., male    DOB: Oct 12, 1945  Age: 75 y.o. MRN: [id number]  CC: The primary encounter diagnosis was Need for immunization against influenza. 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  HPI Johnathan Arnold. presents for  Chief Complaint  Patient presents with   Hospitalization [facility] follow up from a fall   This visit occurred during the SARS-CoV-2 public health emergency.  Safety protocols were in place, including screening questions prior to the visit, additional usage of staff PPE, and extensive cleaning of exam room while observing appropriate contact time as indicated for disinfecting solutions.   Admitted to [facility] on Nov 29 after a  fall secondary to dehydration and syncope , struck head on dresser.  Occurred during a middle of the night bathroom trip.  Legs would not move or support him upon standing up from the bed and he fell against the dresser.   Reviewed imaging studies:   no acute changes from CT head ,and chest  brain MRI and Carotid dopplers  showed  no significant stenosis bilaterally.  Carvedilol and diazepam suspended .due to bradycardia   Amlodipine 10 mg started    Using clonazepam 1 to 1.5 tablets at night for insomnia ([place])  does not use diazepam except fro rare occurrences of vertigo.  Knows not to use them on the same day   PD:  still attending [facility] program  several times per week    Outpatient Medications Prior to Visit  Medication Sig Dispense Refill   amLODipine (NORVASC) 10 MG tablet Take 1 tablet (10 mg total) by mouth daily. 30 tablet 1   aspirin EC 81 MG tablet Take 1 tablet (81 mg total) by mouth at bedtime. 30 tablet 11   carbidopa-levodopa (SINEMET CR) 50-200 MG tablet Take 1 tablet by mouth at  bedtime. 90 tablet 2   carbidopa-levodopa (SINEMET IR) 25-100 MG tablet Take 2 tablets by mouth 3 (three) times daily. 2 at 8am, 2 at 11am, 2 at 2 pm, 1 at 5pm 630 tablet 1   clonazePAM (KLONOPIN) 0.5 MG tablet TAKE 1 AND 1/2 TABLET BY MOUTH EVERY NIGHT AT BEDTIME 45 tablet 4   furosemide (LASIX) 20 MG tablet TAKE ONE TABLET BY MOUTH DAILY 90 tablet 2   isosorbide mononitrate (IMDUR) 60 MG 24 hr tablet TAKE ONE TABLET BY MOUTH DAILY 90 tablet 1   losartan (COZAAR) 25 MG tablet Take 1 tablet (25 mg total) by mouth daily. 90 tablet 1   melatonin 3 MG TABS tablet Take 3 mg by mouth at bedtime.     Multiple Vitamin (MULTIVITAMIN) tablet Take 1 tablet by mouth daily.     nitroGLYCERIN (NITROSTAT) 0.4 MG SL tablet Place 1 tablet (0.4 mg total) under the tongue every 5 (five) minutes as needed for chest pain. 25 tablet 1   omeprazole (PRILOSEC) 20 MG capsule TAKE ONE CAPSULE BY MOUTH EVERY MORNING 90 capsule 0   polyethylene glycol (MIRALAX / GLYCOLAX) packet Take 17 g by mouth daily.      ranolazine (RANEXA) 1000 MG SR tablet Take 1 tablet (1,000 mg total) by mouth 2 (two) times daily. 60 tablet 5   rosuvastatin (CRESTOR) 10 MG tablet Take 1 tablet (10 mg total) by mouth daily. 90 tablet 3   tamsulosin (FLOMAX) 0.4 MG CAPS  capsule TAKE ONE CAPSULE BY MOUTH DAILY 90 capsule 3   No facility-administered medications prior to visit.    Review of Systems;  Patient denies headache, fevers, malaise, unintentional weight loss, skin rash, eye pain, sinus congestion and sinus pain, sore throat, dysphagia,  hemoptysis , cough, dyspnea, wheezing, chest pain, palpitations, orthopnea, edema, abdominal pain, nausea, melena, diarrhea, constipation, flank pain, dysuria, hematuria, urinary  Frequency, nocturia, numbness, tingling, seizures,  Focal weakness, Loss of consciousness,  Tremor, insomnia, depression, anxiety, and suicidal ideation.      Objective:  BP (!) 138/52 (BP Location: Left Arm, Patient Position:  Sitting, Cuff Size: Normal)   Pulse 60   Temp (!) 96 F (35.6 C) (Temporal)   Ht 5\' 6"  (1.676 m)   Wt 178 lb 9.6 oz (81 kg)   SpO2 99%   BMI 28.83 kg/m   BP Readings from Last 3 Encounters:  05/16/21 (!) 138/52  05/09/21 (!) 151/56  03/08/21 (!) 144/62    Wt Readings from Last 3 Encounters:  05/16/21 178 lb 9.6 oz (81 kg)  05/07/21 174 lb (78.9 kg)  03/08/21 178 lb (80.7 kg)    General appearance: alert, cooperative and appears stated age Ears: normal TM's and external ear canals both ears Throat: lips, mucosa, and tongue normal; teeth and gums normal Neck: no adenopathy, no carotid bruit, supple, symmetrical, trachea midline and thyroid not enlarged, symmetric, no tenderness/mass/nodules Back: symmetric, no curvature. ROM normal. No CVA tenderness. Lungs: clear to auscultation bilaterally Heart: regular rate and rhythm, S1, S2 normal, no murmur, click, rub or gallop Abdomen: soft, non-tender; bowel sounds normal; no masses,  no organomegaly Pulses: 2+ and symmetric Skin: Skin color, texture, turgor normal. No rashes or lesions Lymph nodes: Cervical, supraclavicular, and axillary nodes normal.  Lab Results  Component Value Date   HGBA1C 5.4 05/07/2021   HGBA1C 5.3 01/24/2021   HGBA1C 5.8 09/06/2020    Lab Results  Component Value Date   CREATININE 1.74 (H) 05/08/2021   CREATININE 1.65 (H) 05/07/2021   CREATININE 1.76 (H) 12/20/2020    Lab Results  Component Value Date   WBC 4.4 05/08/2021   HGB 10.8 (L) 05/08/2021   HCT 30.7 (L) 05/08/2021   PLT 129 (L) 05/08/2021   GLUCOSE 97 05/08/2021   CHOL 122 01/24/2021   TRIG 111.0 01/24/2021   HDL 62.80 01/24/2021   LDLDIRECT 38.0 08/06/2015   LDLCALC 37 01/24/2021   ALT 19 05/08/2021   AST 22 05/08/2021   NA 136 05/08/2021   K 4.1 05/08/2021   CL 105 05/08/2021   CREATININE 1.74 (H) 05/08/2021   BUN 17 05/08/2021   CO2 26 05/08/2021   TSH 2.674 05/07/2021   PSA 1.10 09/06/2020   HGBA1C 5.4 05/07/2021    MICROALBUR <0.7 08/17/2019    DG Chest 2 View  Result Date: 05/07/2021 CLINICAL DATA:  Recent fall injury. EXAM: CHEST - 2 VIEW COMPARISON:  Chest CT 03/04/2021. FINDINGS: The cardiac size and vascular pattern are normal. There is calcification in the aortic arch and old CABG changes. The lungs are clear. The sulci are sharp. Mild osteopenia and thoracic spondylosis. IMPRESSION: No active cardiopulmonary disease.  Stable post CABG chest. Electronically Signed   By: Telford Nab M.D.   On: 05/07/2021 05:51   CT Head Wo Contrast  Result Date: 05/07/2021 CLINICAL DATA:  75 year old male status post fall striking head on dresser. EXAM: CT HEAD WITHOUT CONTRAST TECHNIQUE: Contiguous axial images were obtained from the base of the skull through  the vertex without intravenous contrast. COMPARISON:  Brain MRI 01/15/2015.  Head CT 11/08/2020. FINDINGS: Brain: Stable cerebral volume. No midline shift, ventriculomegaly, mass effect, evidence of mass lesion, intracranial hemorrhage or evidence of cortically based acute infarction. Gray-white matter differentiation appears stable and within normal limits for age. Vascular: Calcified atherosclerosis at the skull base. No suspicious intracranial vascular hyperdensity. Skull: Stable, intact. Sinuses/Orbits: Visualized paranasal sinuses and mastoids are stable and well aerated. Other: No discrete orbit or scalp soft tissue injury identified. IMPRESSION: 1. No acute traumatic injury identified. 2. Stable and negative for age non contrast CT appearance of the brain. Electronically Signed   By: Genevie Ann M.D.   On: 05/07/2021 05:13   CT Chest Wo Contrast  Result Date: 05/07/2021 CLINICAL DATA:  Respiratory illness, cough, history of aspiration EXAM: CT CHEST WITHOUT CONTRAST TECHNIQUE: Multidetector CT imaging of the chest was performed following the standard protocol without IV contrast. COMPARISON:  03/04/2021, 11/21/2020 FINDINGS: Cardiovascular: Aortic  atherosclerosis. Normal heart size. Extensive 3 vessel coronary artery calcifications and stents status post median sternotomy and CABG. No pericardial effusion. Mediastinum/Nodes: No enlarged mediastinal, hilar, or axillary lymph nodes. Thyroid gland, trachea, and esophagus demonstrate no significant findings. Lungs/Pleura: Occasional small pulmonary nodules are stable and benign, for example a 0.7 cm nodule of the posterior right apex (series 3, image 25) and a 0.4 cm nodule of the superior segment right lower lobe (series 3, image 80). No pleural effusion or pneumothorax. Upper Abdomen: No acute abnormality. Musculoskeletal: No chest wall mass or suspicious bone lesions identified. IMPRESSION: 1. No acute airspace disease. 2. Occasional small pulmonary nodules are stable and benign. No further follow-up or characterization is required for these nodules. 3. Coronary artery disease. Aortic Atherosclerosis (ICD10-I70.0). Electronically Signed   By: Delanna Ahmadi M.D.   On: 05/07/2021 08:45   MR BRAIN WO CONTRAST  Result Date: 05/07/2021 CLINICAL DATA:  Dizziness. Fall with head injury. Parkinson's disease. EXAM: MRI HEAD WITHOUT CONTRAST TECHNIQUE: Multiplanar, multiecho pulse sequences of the brain and surrounding structures were obtained without intravenous contrast. COMPARISON:  Head CT earlier same day.  MRI 01/15/2015. FINDINGS: Brain: The study suffers from some motion degradation. Diffusion imaging does not show any acute or subacute infarction. The brainstem and cerebellum are normal. Cerebral hemispheres show a few scattered punctate foci of T2 and FLAIR signal within the white matter consistent with mild chronic small vessel change. No cortical or large vessel territory infarction. No mass lesion, hemorrhage, hydrocephalus or extra-axial collection. Vascular: Major vessels at the base of the brain show flow. Skull and upper cervical spine: Negative Sinuses/Orbits: Clear/normal Other: None IMPRESSION:  No acute or reversible finding. Mild chronic small-vessel ischemic changes of the cerebral hemispheric white matter, slightly progressive since 2016. Electronically Signed   By: Nelson Chimes M.D.   On: 05/07/2021 09:55   US Carotid Bilateral  Result Date: 05/07/2021 CLINICAL DATA:  Syncope EXAM: BILATERAL CAROTID DUPLEX ULTRASOUND TECHNIQUE: Pearline Cables scale imaging, color Doppler and duplex ultrasound were performed of bilateral carotid and vertebral arteries in the neck. COMPARISON:  Ultrasound 01/15/2015 FINDINGS: Criteria: Quantification of carotid stenosis is based on velocity parameters that correlate the residual internal carotid diameter with NASCET-based stenosis levels, using the diameter of the distal internal carotid lumen as the denominator for stenosis measurement. The following velocity measurements were obtained: RIGHT ICA: 147 cm/sec CCA: 90 cm/sec SYSTOLIC ICA/CCA RATIO:  1.6 ECA: 194 cm/sec LEFT ICA: 123 cm/sec CCA: 89 cm/sec SYSTOLIC ICA/CCA RATIO:  1.5 ECA: 232 cm/sec RIGHT  CAROTID ARTERY: Moderate mostly calcified plaque at the right carotid bulb extending into the origin and proximal aspect of the right internal carotid artery. Mostly echogenic at the origin of the right external carotid artery with elevation peak systolic velocity. Estimated less than 50% stenosis of the right ICA. RIGHT VERTEBRAL ARTERY:  Antegrade flow is present LEFT CAROTID ARTERY: Moderate mixed echogenicity plaque at the left carotid bulb and extending into the origin of the left internal carotid artery. Estimated less than 50% stenosis of the left ICA. LEFT VERTEBRAL ARTERY: Antegrade flow within the left vertebral artery. IMPRESSION: 1. No hemodynamically significant stenosis or occlusive disease within either internal carotid artery. 2. 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  Attending [employer] program 3 days per week . Recent fall seems to have occurred in part due to freezing of legs .  Advised to keep a walker near the bed. Marland Kitchen       Hospital discharge follow-up   Bradycardia    Improved with suspension in carveidlol       Bilateral leg weakness    He had a fall recently due to legs freezing and giving way.  Walker advised      Relevant Orders   For home use only DME Other see comment   Other Visit Diagnoses     Need for immunization against influenza    -  Primary   Relevant Orders   Flu Vaccine QUAD High Dose(Fluad) (Completed)       I am having Johnathan Arnold. "Ronalee Belts" maintain his multivitamin, polyethylene glycol, tamsulosin, rosuvastatin, aspirin EC, nitroGLYCERIN, carbidopa-levodopa, losartan, carbidopa-levodopa, ranolazine, furosemide, isosorbide mononitrate, clonazePAM, omeprazole, melatonin, and amLODipine.  No orders of the defined types were placed in this encounter.    I provided  30 minutes of  face-to-face time during this encounter reviewing patient's current problems and past surgeries, labs and imaging studies, providing counseling on the above mentioned problems , and coordination  of care .   Follow-up: Return for follow up diabetes.   [name], MD

## 2021-05-16 NOTE — Assessment & Plan Note (Signed)
Improved with suspension in carveidlol

## 2021-05-16 NOTE — Assessment & Plan Note (Signed)
Platelets are stable and > 100 K  Lab Results  Component Value Date   WBC 4.4 05/08/2021   HGB 10.8 (L) 05/08/2021   HCT 30.7 (L) 05/08/2021   MCV 97.8 05/08/2021   PLT 129 (L) 05/08/2021

## 2021-05-17 ENCOUNTER — Ambulatory Visit: Payer: Medicare Other | Admitting: Physical Therapy

## 2021-05-21 ENCOUNTER — Ambulatory Visit: Payer: Medicare Other | Admitting: Physical Therapy

## 2021-05-24 ENCOUNTER — Ambulatory Visit: Payer: Medicare Other | Admitting: Physical Therapy

## 2021-05-24 DIAGNOSIS — H903 Sensorineural hearing loss, bilateral: Secondary | ICD-10-CM | POA: Diagnosis not present

## 2021-05-27 ENCOUNTER — Other Ambulatory Visit: Payer: Self-pay | Admitting: Cardiovascular Disease

## 2021-05-28 ENCOUNTER — Ambulatory Visit: Payer: Medicare Other | Admitting: Physical Therapy

## 2021-05-29 DIAGNOSIS — R42 Dizziness and giddiness: Secondary | ICD-10-CM | POA: Diagnosis not present

## 2021-05-29 DIAGNOSIS — H9122 Sudden idiopathic hearing loss, left ear: Secondary | ICD-10-CM | POA: Diagnosis not present

## 2021-05-29 DIAGNOSIS — H903 Sensorineural hearing loss, bilateral: Secondary | ICD-10-CM | POA: Diagnosis not present

## 2021-05-31 ENCOUNTER — Ambulatory Visit: Payer: Medicare Other | Admitting: Physical Therapy

## 2021-06-04 ENCOUNTER — Ambulatory Visit: Payer: Medicare Other | Admitting: Physical Therapy

## 2021-06-07 ENCOUNTER — Ambulatory Visit: Payer: Medicare Other | Admitting: Physical Therapy

## 2021-06-13 DIAGNOSIS — Z23 Encounter for immunization: Secondary | ICD-10-CM | POA: Diagnosis not present

## 2021-06-21 DIAGNOSIS — R42 Dizziness and giddiness: Secondary | ICD-10-CM | POA: Diagnosis not present

## 2021-06-21 DIAGNOSIS — H9122 Sudden idiopathic hearing loss, left ear: Secondary | ICD-10-CM | POA: Diagnosis not present

## 2021-06-26 ENCOUNTER — Other Ambulatory Visit: Payer: Self-pay | Admitting: *Deleted

## 2021-06-26 DIAGNOSIS — N401 Enlarged prostate with lower urinary tract symptoms: Secondary | ICD-10-CM

## 2021-06-26 MED ORDER — TAMSULOSIN HCL 0.4 MG PO CAPS: 0.4000 mg | ORAL_CAPSULE | Freq: Every day | ORAL | 0 refills | Status: DC

## 2021-07-06 ENCOUNTER — Emergency Department: Payer: Medicare Other

## 2021-07-06 ENCOUNTER — Encounter: Payer: Self-pay | Admitting: Emergency Medicine

## 2021-07-06 ENCOUNTER — Other Ambulatory Visit: Payer: Self-pay

## 2021-07-06 ENCOUNTER — Emergency Department
Admission: EM | Admit: 2021-07-06 | Discharge: 2021-07-06 | Disposition: A | Discharge status: Home / Self Care | Payer: Medicare Other | Attending: Emergency Medicine | Admitting: Emergency Medicine

## 2021-07-06 DIAGNOSIS — R001 Bradycardia, unspecified: Secondary | ICD-10-CM | POA: Diagnosis not present

## 2021-07-06 DIAGNOSIS — N189 Chronic kidney disease, unspecified: Secondary | ICD-10-CM | POA: Insufficient documentation

## 2021-07-06 DIAGNOSIS — E860 Dehydration: Secondary | ICD-10-CM | POA: Diagnosis not present

## 2021-07-06 DIAGNOSIS — E1122 Type 2 diabetes mellitus with diabetic chronic kidney disease: Secondary | ICD-10-CM | POA: Insufficient documentation

## 2021-07-06 DIAGNOSIS — A084 Viral intestinal infection, unspecified: Secondary | ICD-10-CM

## 2021-07-06 DIAGNOSIS — R197 Diarrhea, unspecified: Secondary | ICD-10-CM | POA: Diagnosis not present

## 2021-07-06 DIAGNOSIS — K529 Noninfective gastroenteritis and colitis, unspecified: Secondary | ICD-10-CM | POA: Diagnosis not present

## 2021-07-06 DIAGNOSIS — R531 Weakness: Secondary | ICD-10-CM | POA: Diagnosis not present

## 2021-07-06 DIAGNOSIS — Z20822 Contact with and (suspected) exposure to covid-19: Secondary | ICD-10-CM | POA: Diagnosis not present

## 2021-07-06 DIAGNOSIS — I959 Hypotension, unspecified: Secondary | ICD-10-CM | POA: Diagnosis not present

## 2021-07-06 DIAGNOSIS — G20 Parkinson's disease: Secondary | ICD-10-CM | POA: Diagnosis not present

## 2021-07-06 DIAGNOSIS — I129 Hypertensive chronic kidney disease with stage 1 through stage 4 chronic kidney disease, or unspecified chronic kidney disease: Secondary | ICD-10-CM | POA: Insufficient documentation

## 2021-07-06 LAB — BASIC METABOLIC PANEL
Anion gap: 6 (ref 5–15)
BUN: 13 mg/dL (ref 8–23)
CO2: 25 mmol/L (ref 22–32)
Calcium: 8.2 mg/dL — ABNORMAL LOW (ref 8.9–10.3)
Chloride: 105 mmol/L (ref 98–111)
Creatinine, Ser: 1.55 mg/dL — ABNORMAL HIGH (ref 0.61–1.24)
GFR, Estimated: 46 mL/min — ABNORMAL LOW (ref >60)
Glucose, Bld: 137 mg/dL — ABNORMAL HIGH (ref 70–99)
Potassium: 4.6 mmol/L (ref 3.5–5.1)
Sodium: 136 mmol/L (ref 135–145)

## 2021-07-06 LAB — CBC
HCT: 31.4 % — ABNORMAL LOW (ref 39.0–52.0)
Hemoglobin: 10.9 g/dL — ABNORMAL LOW (ref 13.0–17.0)
MCH: 33.4 pg (ref 26.0–34.0)
MCHC: 34.7 g/dL (ref 30.0–36.0)
MCV: 96.3 fL (ref 80.0–100.0)
Platelets: 109 10*3/uL — ABNORMAL LOW (ref 150–400)
RBC: 3.26 MIL/uL — ABNORMAL LOW (ref 4.22–5.81)
RDW: 11.9 % (ref 11.5–15.5)
WBC: 7.3 10*3/uL (ref 4.0–10.5)
nRBC: 0 % (ref 0.0–0.2)

## 2021-07-06 LAB — URINALYSIS, ROUTINE W REFLEX MICROSCOPIC
Bilirubin Urine: NEGATIVE
Glucose, UA: NEGATIVE mg/dL
Hgb urine dipstick: NEGATIVE
Leukocytes,Ua: NEGATIVE
Nitrite: NEGATIVE
Protein, ur: 100 mg/dL — AB
Specific Gravity, Urine: >1.03 — ABNORMAL HIGH (ref 1.005–1.030)
Squamous Epithelial / HPF: NONE SEEN (ref 0–5)
pH: 5 (ref 5.0–8.0)

## 2021-07-06 LAB — RESP PANEL BY RT-PCR (FLU A&B, COVID) ARPGX2
Influenza A by PCR: NEGATIVE
Influenza B by PCR: NEGATIVE
SARS Coronavirus 2 by RT PCR: NEGATIVE

## 2021-07-06 MED ORDER — ONDANSETRON HCL 4 MG/2ML IJ SOLN
4.0000 mg | Freq: Once | INTRAMUSCULAR | Status: AC
  Administered 2021-07-06: 4 mg via INTRAVENOUS
  Filled 2021-07-06: qty 2

## 2021-07-06 MED ORDER — ONDANSETRON 4 MG PO TBDP: 4.0000 mg | ORAL_TABLET | Freq: Three times a day (TID) | ORAL | 0 refills | Status: DC | PRN

## 2021-07-06 MED ORDER — PANTOPRAZOLE SODIUM 40 MG IV SOLR
40.0000 mg | Freq: Once | INTRAVENOUS | Status: AC
  Administered 2021-07-06: 40 mg via INTRAVENOUS
  Filled 2021-07-06: qty 40

## 2021-07-06 MED ORDER — CARBIDOPA-LEVODOPA 25-100 MG PO TABS
2.0000 | ORAL_TABLET | ORAL | Status: AC
  Administered 2021-07-06: 2 via ORAL
  Filled 2021-07-06: qty 2

## 2021-07-06 MED ORDER — SODIUM CHLORIDE 0.9 % IV BOLUS
1000.0000 mL | Freq: Once | INTRAVENOUS | Status: AC
  Administered 2021-07-06: 1000 mL via INTRAVENOUS

## 2021-07-06 NOTE — ED Provider Notes (Signed)
[facility] Provider Note    Event Date/Time   First MD Initiated Contact with Patient 07/06/21 1455     (approximate)   History   Weakness   HPI  Johnathan Arnold. is a 76 y.o. male with a history of diabetes hypertension hyperlipidemia and Parkinson's disease who comes the ED complaining of generalized weakness for the past 5 days.  Initially he also had vomiting and diarrhea but that has improved.  He has had decreased oral intake and difficulty drinking enough fluids to stay hydrated.  Reports that he is very fatigued.  No dizziness, no syncope/fall/trauma.  No chest pain or shortness of breath.  Denies abdominal pain.  No black or bloody stool.       Physical Exam   Triage Vital Signs: ED Triage Vitals  Enc Vitals Group     BP 07/06/21 1033 (!) 141/51     Pulse Rate 07/06/21 1033 (!) 51     Resp 07/06/21 1033 18     Temp 07/06/21 1037 98.5 F (36.9 C)     Temp Source 07/06/21 1033 Oral     SpO2 07/06/21 1033 94 %     Weight 07/06/21 1038 165 lb (74.8 kg)     Height 07/06/21 1038 5\' 6"  (1.676 m)     Head Circumference --      Peak Flow --      Pain Score 07/06/21 1035 0     Pain Loc --      Pain Edu? --      Excl. in [place]? --     Most recent vital signs: Vitals:   07/06/21 1530 07/06/21 1600  BP: 136/84 (!) 132/50  Pulse: (!) 49 (!) 50  Resp: 14 16  Temp:    SpO2: 98% 97%     General: Awake, no distress.  CV:  Good peripheral perfusion.  Normal pulses.  Bradycardia heart rate 50 Resp:  Normal effort.  Clear to auscultation bilaterally Abd:  No distention.  Soft and nontender Other:  No lower extremity edema.  No orthopnea.  No rash   ED Results / Procedures / Treatments   Labs (all labs ordered are listed, but only abnormal results are displayed) Labs Reviewed  BASIC METABOLIC PANEL - Abnormal; Notable for the following components:      Result Value   Glucose, Bld 137 (*)    Creatinine, Ser 1.55 (*)    Calcium 8.2 (*)     GFR, Estimated 46 (*)    All other components within normal limits  CBC - Abnormal; Notable for the following components:   RBC 3.26 (*)    Hemoglobin 10.9 (*)    HCT 31.4 (*)    Platelets 109 (*)    All other components within normal limits  URINALYSIS, ROUTINE W REFLEX MICROSCOPIC - Abnormal; Notable for the following components:   Specific Gravity, Urine >1.030 (*)    Ketones, ur TRACE (*)    Protein, ur 100 (*)    Bacteria, UA RARE (*)    All other components within normal limits  RESP PANEL BY RT-PCR (FLU A&B, COVID) [id number]  CBG MONITORING, ED     EKG  Interpreted by me Sinus bradycardia rate of 53.  Normal axis and intervals.  Normal QRS ST segments and T waves.   RADIOLOGY Chest x-ray viewed and interpreted by me, appears unremarkable.  Radiology report reviewed    PROCEDURES:  Critical Care performed: No  .1-3 Lead EKG  Interpretation Performed by: Carrie Mew, MD Authorized by: Carrie Mew, MD     Interpretation: normal     ECG rate:  55   ECG rate assessment: bradycardic     Rhythm: sinus rhythm     Ectopy: none     Conduction: normal     MEDICATIONS ORDERED IN ED: Medications  ondansetron (ZOFRAN) injection 4 mg (4 mg Intravenous Given 07/06/21 1537)  pantoprazole (PROTONIX) injection 40 mg (40 mg Intravenous Given 07/06/21 1537)  sodium chloride 0.9 % bolus 1,000 mL (1,000 mLs Intravenous New Bag/Given 07/06/21 1538)     IMPRESSION / MDM / ASSESSMENT AND PLAN / ED COURSE  I reviewed the triage vital signs and the nursing notes.                              Differential diagnosis includes, but is not limited to, dehydration, electrolyte abnormality, UTI, pneumonia, viral illness  The patient is on the cardiac monitor to evaluate for evidence of arrhythmia and/or significant heart rate changes.  Patient presents with symptoms of viral gastroenteritis.  He is not in distress, vital signs are unremarkable, exam is reassuring.  Serum  lab panel was unremarkable with stable chronic anemia and CKD.  EKG and chest x-ray are unremarkable.  Will give IV fluids, Protonix and Zofran IV for symptom relief.  He does not require admission due to stable renal function and hemoglobin on labs and reassuring abdominal exam.  If he is tolerating oral intake and feeling better, I think he will be stable for discharge home.  Clinical Course as of 07/06/21 1700  Sat Jul 06, 2021  1700 Patient is feeling better and tolerating oral intake. [PS]    Clinical Course User Index [PS] Carrie Mew, MD     FINAL CLINICAL IMPRESSION(S) / ED DIAGNOSES   Final diagnoses:  Viral gastroenteritis  Dehydration     Rx / DC Orders   ED Discharge Orders          Ordered    ondansetron (ZOFRAN-ODT) 4 MG disintegrating tablet  Every 8 hours PRN        07/06/21 1615             Note:  This document was prepared using Dragon voice recognition software and may include unintentional dictation errors.   Carrie Mew, MD 07/06/21 1622

## 2021-07-06 NOTE — ED Notes (Signed)
Pt to ED via [facility] from home for Diarrhea since Monday. Pt has hx/o Parkinson's disease. SB- 50

## 2021-07-06 NOTE — ED Notes (Signed)
Pt ambulated to make sure he felt okay. Pt states he felt fine walking and wanted to go home.

## 2021-07-06 NOTE — ED Triage Notes (Signed)
Pt via EMS from home. Pt c/o generalized weakness since this AM. Pt states he is normally able to walk and now he cannot. Pt has a hx of parkinson. Pt is A&Ox4 and slow answer.

## 2021-07-06 NOTE — ED Notes (Signed)
Pt given water for po challenge.

## 2021-07-09 NOTE — Progress Notes (Signed)
07/10/21 11:04 AM   [name] 1946/05/29 [id number]  Referring provider:  [name], MD [place],  [place] [address] Chief Complaint  Patient presents with   Benign Prostatic Hypertrophy     HPI: Johnathan Arnold. is a 76 y.o.male with a personal history of BXO/penile adhensions and BPH with obstruction, who presents today for refill of medication.   He is s/p penile biopsy, cystoscopy and meatal dilation in 2015. He was on clobetasol cream.   His most recent PSA on 09/06/2020 was 1.10.   BPH managed on Flomax.   He was recently in the ED on 07/06/2021. He had a urinalysis that was concentrated but otherwise unremarkable.   He is accompanied by his wife today. He reports today that his BXO is doing well he is no longer using the clobetasol cream. He reports that he feels like he has a good stream and is emptying well.     PMH: Past Medical History:  Diagnosis Date   3-vessel coronary artery disease    s/p  5 vessel CABG   Diabetes mellitus without complication ([place])    History of cardiac catheterization 2011   [facility]   Hyperlipidemia    Hypertension    Hypertriglyceridemia    Parkinson's disease ([place])    Pneumonia 12/28/2020   S/P CABG x 5 11-99   Vertigo     Surgical History: Past Surgical History:  Procedure Laterality Date   CARDIAC CATHETERIZATION  05-19-2010   [facility]: Patent grafts. LIMA to LAD, SVG to D1, OM1 and RPDA   CORONARY ARTERY BYPASS GRAFT  03/1998   5 vessel, [facility]   RIGHT HEART CATH N/A 04/04/2019   Procedure: RIGHT HEART CATH;  Surgeon: Wellington Hampshire, MD;  Location: [facility];  Service: Cardiovascular;  Laterality: N/A;   RIGHT/LEFT HEART CATH AND CORONARY ANGIOGRAPHY N/A 02/01/2018   Procedure: RIGHT/LEFT HEART CATH AND CORONARY ANGIOGRAPHY;  Surgeon: Wellington Hampshire, MD;  Location: [facility];  Service: Cardiovascular;  Laterality: N/A;    Home Medications:   Allergies as of 07/10/2021   No Known Allergies      Medication List        Accurate as of July 10, 2021 11:04 AM. If you have any questions, ask your nurse or doctor.          amLODipine 10 MG tablet Commonly known as: NORVASC Take 1 tablet (10 mg total) by mouth daily.   aspirin EC 81 MG tablet Take 1 tablet (81 mg total) by mouth at bedtime.   carbidopa-levodopa 25-100 MG tablet Commonly known as: SINEMET IR Take 2 tablets by mouth 3 (three) times daily. 2 at 8am, 2 at 11am, 2 at 2 pm, 1 at 5pm What changed: Another medication with the same name was removed. Continue taking this medication, and follow the directions you see here. Changed by: Hollice Espy, MD   clonazePAM 0.5 MG tablet Commonly known as: KLONOPIN TAKE 1 AND 1/2 TABLET BY MOUTH EVERY NIGHT AT BEDTIME   furosemide 20 MG tablet Commonly known as: LASIX TAKE ONE TABLET BY MOUTH DAILY   isosorbide mononitrate 60 MG 24 hr tablet Commonly known as: IMDUR TAKE ONE TABLET BY MOUTH DAILY   losartan 25 MG tablet Commonly known as: COZAAR Take 1 tablet (25 mg total) by mouth daily.   melatonin 3 MG Tabs tablet Take 3 mg by mouth at bedtime.   multivitamin tablet Take  1 tablet by mouth daily.   nitroGLYCERIN 0.4 MG SL tablet Commonly known as: NITROSTAT Place 1 tablet (0.4 mg total) under the tongue every 5 (five) minutes as needed for chest pain.   omeprazole 20 MG capsule Commonly known as: PRILOSEC TAKE ONE CAPSULE BY MOUTH EVERY MORNING   ondansetron 4 MG disintegrating tablet Commonly known as: ZOFRAN-ODT Take 1 tablet (4 mg total) by mouth every 8 (eight) hours as needed for nausea or vomiting.   polyethylene glycol 17 g packet Commonly known as: MIRALAX / GLYCOLAX Take 17 g by mouth daily.   predniSONE 10 MG tablet Commonly known as: DELTASONE Take 40 mg by mouth daily.   ranolazine 1000 MG SR tablet Commonly known as: RANEXA Take 1 tablet (1,000 mg total) by mouth 2 (two)  times daily.   rosuvastatin 10 MG tablet Commonly known as: CRESTOR TAKE ONE TABLET BY MOUTH DAILY   tamsulosin 0.4 MG Caps capsule Commonly known as: FLOMAX Take 1 capsule (0.4 mg total) by mouth daily.        Allergies: No Known Allergies  Family History: Family History  Problem Relation Age of Onset   Heart attack Mother 70   Hypertension Mother    Heart attack Father 83   Heart disease Father    Heart disease Brother    Healthy Son     Social History:  reports that he has never smoked. He has been exposed to tobacco smoke. He has never used smokeless tobacco. He reports current alcohol use. He reports that he does not use drugs.   Physical Exam: BP 131/70    Pulse (!) 57    Ht 5\' 6"  (1.676 m)    Wt 166 lb (75.3 kg)    BMI 26.79 kg/m   Constitutional:  Alert and oriented, No acute distress.  Accompanied by wife today.   HEENT: [place] AT, moist mucus membranes.  Trachea midline, no masses. Cardiovascular: No clubbing, cyanosis, or edema. Respiratory: Normal respiratory effort, no increased work of breathing. Skin: No rashes, bruises or suspicious lesions. Neurologic: Grossly intact, no focal deficits, moving all 4 extremities. Psychiatric: Normal mood and affect.  Laboratory Data:  Lab Results  Component Value Date   CREATININE 1.55 (H) 07/06/2021   Lab Results  Component Value Date   PSA 1.10 09/06/2020   PSA 1.06 08/17/2019   PSA 1.08 08/01/2014    Lab Results  Component Value Date   HGBA1C 5.4 05/07/2021   Assessment & Plan:    1. BXO/penile adhesions - Advise patient to use Clobetasol as needed.  - Continue to self reduce any adhesions and be diligent about hygiene  2. BPH with obstruction  - He is doing well symptomatically  - We have deffered rectal exam based on age - Continue on Flomax. We discussed side effects of dizziness on this medication.  - Prescription for Flomax sent   Return in 1 year with PA  I,[name],acting as a  scribe for Hollice Espy, MD.,have documented all relevant documentation on the behalf of Hollice Espy, MD,as directed by  Hollice Espy, MD while in the presence of Hollice Espy, MD.  I have reviewed the above documentation for accuracy and completeness, and I agree with the above.   Hollice Espy, MD   [facility] [address], [place], [place] [address] [phone]

## 2021-07-10 ENCOUNTER — Ambulatory Visit (INDEPENDENT_AMBULATORY_CARE_PROVIDER_SITE_OTHER): Payer: Medicare Other | Admitting: Urology

## 2021-07-10 ENCOUNTER — Other Ambulatory Visit: Payer: Self-pay

## 2021-07-10 DIAGNOSIS — N401 Enlarged prostate with lower urinary tract symptoms: Secondary | ICD-10-CM

## 2021-07-10 MED ORDER — TAMSULOSIN HCL 0.4 MG PO CAPS: 0.4000 mg | ORAL_CAPSULE | Freq: Every day | ORAL | 6 refills | Status: DC

## 2021-07-16 DIAGNOSIS — H90A22 Sensorineural hearing loss, unilateral, left ear, with restricted hearing on the contralateral side: Secondary | ICD-10-CM | POA: Diagnosis not present

## 2021-07-16 DIAGNOSIS — R42 Dizziness and giddiness: Secondary | ICD-10-CM | POA: Diagnosis not present

## 2021-07-16 DIAGNOSIS — H9122 Sudden idiopathic hearing loss, left ear: Secondary | ICD-10-CM | POA: Diagnosis not present

## 2021-07-17 ENCOUNTER — Other Ambulatory Visit: Payer: Self-pay

## 2021-07-17 ENCOUNTER — Telehealth: Payer: Self-pay

## 2021-07-17 ENCOUNTER — Encounter: Payer: Self-pay | Admitting: Internal Medicine

## 2021-07-17 ENCOUNTER — Ambulatory Visit (INDEPENDENT_AMBULATORY_CARE_PROVIDER_SITE_OTHER): Payer: Medicare Other | Admitting: Internal Medicine

## 2021-07-17 VITALS — BP 122/58 | HR 58 | Temp 98.7°F | Ht 66.0 in | Wt 167.8 lb

## 2021-07-17 DIAGNOSIS — R29898 Other symptoms and signs involving the musculoskeletal system: Secondary | ICD-10-CM

## 2021-07-17 DIAGNOSIS — G20 Parkinson's disease: Secondary | ICD-10-CM | POA: Diagnosis not present

## 2021-07-17 DIAGNOSIS — I10 Essential (primary) hypertension: Secondary | ICD-10-CM | POA: Diagnosis not present

## 2021-07-17 DIAGNOSIS — N1832 Chronic kidney disease, stage 3b: Secondary | ICD-10-CM | POA: Diagnosis not present

## 2021-07-17 NOTE — Assessment & Plan Note (Signed)
He has had recurrent malaise with low BP, likely due to parkinson induced orthostasis.  Advised him to take losartan at night instead of AM.  Continue carvedilol and Imdur given CAD and angina

## 2021-07-17 NOTE — Assessment & Plan Note (Signed)
Progressive,  With  anemia . Follow up  labs along with EPO level note improved Cr and stable hgb  Lab Results  Component Value Date   CREATININE 1.55 (H) 07/06/2021   Lab Results  Component Value Date   WBC 7.3 07/06/2021   HGB 10.9 (L) 07/06/2021   HCT 31.4 (L) 07/06/2021   MCV 96.3 07/06/2021   PLT 109 (L) 07/06/2021   .

## 2021-07-17 NOTE — Patient Instructions (Signed)
°  If the appetite does not improve with resolution of constipation,  let me know   Physical therapy referral in progress   I am letting you know that I am referring to our chronic care management team which includes a nurse case manager,   and a Education officer, museum. These women  can help  provide additional services to my patients who are on Medicare and dealing with  chronic diseases , like kidney disease, Parkinson's disease.  I do not expect this referral to cost you anything out of pocket, but I do think they  will be able to help you maximize your BENEFITS.  They  will make contact with  You by phone in the next week

## 2021-07-17 NOTE — Chronic Care Management (AMB) (Signed)
°  Chronic Care Management   Outreach Note  07/17/2021 Name: Johnathan Arnold. MRN: [id number] DOB: 10-20-1945  Johnathan Arnold. is a 76 y.o. year old male who is a primary care patient of Derrel Nip, Aris Everts, MD. I reached out to Johnathan Arnold. by phone today in response to a referral sent by [name]'s primary care provider.  An unsuccessful telephone outreach was attempted today. The patient was referred to the case management team for assistance with care management and care coordination.   Follow Up Plan: A HIPAA compliant phone message was left for the patient providing contact information and requesting a return call.  The care management team will reach out to the patient again over the next 7 days.  If patient returns call to provider office, please advise to call [facility]  at [place], [place], [place], [place] [address] Direct Dial: [phone] [email or web address] Website: [email or web address]

## 2021-07-17 NOTE — Assessment & Plan Note (Signed)
He reports progressive weakness and recent episode of falling due to leg collapse.  Referral to  [facility]  PT for strength training

## 2021-07-17 NOTE — Progress Notes (Signed)
Subjective:  Patient ID: Johnathan Arnold., male    DOB: [birth year]  Age: 76 y.o. MRN: [id number]  CC: The primary encounter diagnosis was Bilateral leg weakness. Diagnoses of Parkinson's disease ([place]), Stage 3b chronic kidney disease ([place]), and Primary hypertension were also pertinent to this visit.   This visit occurred during the SARS-CoV-2 public health emergency.  Safety protocols were in place, including screening questions prior to the visit, additional usage of staff PPE, and extensive cleaning of exam room while observing appropriate contact time as indicated for disinfecting solutions.    HPI Hamp Moreland. presents for  Chief Complaint  Patient presents with   Hospitalization Follow-up        1) TREATED in ER on jan 28 for lower extremity weakness and viral gastroenteritis that started and ended on Jan 23.  Was too sick to keep his  appt with [facility])  on that day and went to ER , received  IV fluids and IV zofran.  Lasted one day,   then developed loose stools several days later that had resolved by time of ER visit. Hgb was stable at 10.8 and GFR stable at 48/hr so he was sent home. [place] Kitchen   Has been having progressively weaker for the last several weeks.  Feels that the COVID booster may be contributing (late December) Yesterday his legs collapsed during  a walk when he stepped off a curb.  Required 3 person assist to help him up. Had not missed any carba dopa doses.Mar[place] Kitchen  tremor was also worse yesterday.  Was able to use the commode yesterday unassisted.  Sons want him to move to [facility]. Appt with Tat is not until the 21st.   Still going to [facility] as much as tolerated,  only once in the last week.. was sore the next day   No appetite.  Constipated.  No bm in one week despite using miralax x 4-5 doses.  Drinking at least 32 ounces of water daily   Currently he is having constipation ,  no BM in one week .  He is losing weight due to decreased appetite.     Outpatient Medications Prior to Visit  Medication Sig Dispense Refill   amLODipine (NORVASC) 10 MG tablet Take 1 tablet (10 mg total) by mouth daily. 30 tablet 1   aspirin EC 81 MG tablet Take 1 tablet (81 mg total) by mouth at bedtime. 30 tablet 11   carbidopa-levodopa (SINEMET IR) 25-100 MG tablet Take 2 tablets by mouth 3 (three) times daily. 2 at 8am, 2 at 11am, 2 at 2 pm, 1 at 5pm 630 tablet 1   clonazePAM (KLONOPIN) 0.5 MG tablet TAKE 1 AND 1/2 TABLET BY MOUTH EVERY NIGHT AT BEDTIME 45 tablet 4   furosemide (LASIX) 20 MG tablet TAKE ONE TABLET BY MOUTH DAILY 90 tablet 2   isosorbide mononitrate (IMDUR) 60 MG 24 hr tablet TAKE ONE TABLET BY MOUTH DAILY 90 tablet 1   losartan (COZAAR) 25 MG tablet Take 1 tablet (25 mg total) by mouth daily. 90 tablet 1   melatonin 3 MG TABS tablet Take 3 mg by mouth at bedtime.     Multiple Vitamin (MULTIVITAMIN) tablet Take 1 tablet by mouth daily.     nitroGLYCERIN (NITROSTAT) 0.4 MG SL tablet Place 1 tablet (0.4 mg total) under the tongue every 5 (five) minutes as needed for chest pain. 25 tablet 1   omeprazole (PRILOSEC) 20 MG capsule TAKE ONE CAPSULE BY  MOUTH EVERY MORNING 90 capsule 0   ondansetron (ZOFRAN-ODT) 4 MG disintegrating tablet Take 1 tablet (4 mg total) by mouth every 8 (eight) hours as needed for nausea or vomiting. 20 tablet 0   polyethylene glycol (MIRALAX / GLYCOLAX) packet Take 17 g by mouth daily.      ranolazine (RANEXA) 1000 MG SR tablet Take 1 tablet (1,000 mg total) by mouth 2 (two) times daily. 60 tablet 5   rosuvastatin (CRESTOR) 10 MG tablet TAKE ONE TABLET BY MOUTH DAILY 90 tablet 1   tamsulosin (FLOMAX) 0.4 MG CAPS capsule Take 1 capsule (0.4 mg total) by mouth daily. 90 capsule 6   predniSONE (DELTASONE) 10 MG tablet Take 40 mg by mouth daily. (Patient not taking: Reported on 07/17/2021)     No facility-administered medications prior to visit.    Review of Systems;  Patient denies headache, fevers, malaise,  unintentional weight loss, skin rash, eye pain, sinus congestion and sinus pain, sore throat, dysphagia,  hemoptysis , cough, dyspnea, wheezing, chest pain, palpitations, orthopnea, edema, abdominal pain, nausea, melena, diarrhea, constipation, flank pain, dysuria, hematuria, urinary  Frequency, nocturia, numbness, tingling, seizures,  Loss of consciousness,  Tremor, insomnia, depression, anxiety, and suicidal ideation.      Objective:  BP (!) 122/58    Pulse (!) 58    Temp 98.7 F (37.1 C) (Oral)    Ht 5\' 6"  (1.676 m)    Wt 167 lb 12.8 oz (76.1 kg)    SpO2 98%    BMI 27.08 kg/m   BP Readings from Last 3 Encounters:  07/17/21 (!) 122/58  07/10/21 131/70  07/06/21 (!) 145/52    Wt Readings from Last 3 Encounters:  07/17/21 167 lb 12.8 oz (76.1 kg)  07/10/21 166 lb (75.3 kg)  07/06/21 165 lb (74.8 kg)    General appearance: alert, cooperative and appears stated age Ears: normal TM's and external ear canals both ears Throat: lips, mucosa, and tongue normal; teeth and gums normal Neck: no adenopathy, no carotid bruit, supple, symmetrical, trachea midline and thyroid not enlarged, symmetric, no tenderness/mass/nodules Back: symmetric, no curvature. ROM normal. No CVA tenderness. Lungs: clear to auscultation bilaterally Heart: regular rate and rhythm, S1, S2 normal, no murmur, click, rub or gallop Abdomen: soft, non-tender; bowel sounds normal; no masses,  no organomegaly Pulses: 2+ and symmetric Skin: Skin color, texture, turgor normal. No rashes or lesions Lymph nodes: Cervical, supraclavicular, and axillary nodes normal.  Lab Results  Component Value Date   HGBA1C 5.4 05/07/2021   HGBA1C 5.3 01/24/2021   HGBA1C 5.8 09/06/2020    Lab Results  Component Value Date   CREATININE 1.55 (H) 07/06/2021   CREATININE 1.74 (H) 05/08/2021   CREATININE 1.65 (H) 05/07/2021    Lab Results  Component Value Date   WBC 7.3 07/06/2021   HGB 10.9 (L) 07/06/2021   HCT 31.4 (L) 07/06/2021    PLT 109 (L) 07/06/2021   GLUCOSE 137 (H) 07/06/2021   CHOL 122 01/24/2021   TRIG 111.0 01/24/2021   HDL 62.80 01/24/2021   LDLDIRECT 38.0 08/06/2015   LDLCALC 37 01/24/2021   ALT 19 05/08/2021   AST 22 05/08/2021   NA 136 07/06/2021   K 4.6 07/06/2021   CL 105 07/06/2021   CREATININE 1.55 (H) 07/06/2021   BUN 13 07/06/2021   CO2 25 07/06/2021   TSH 2.674 05/07/2021   PSA 1.10 09/06/2020   HGBA1C 5.4 05/07/2021   MICROALBUR <0.7 08/17/2019    DG Chest Portable 1 View  Result Date: 07/06/2021 CLINICAL DATA:  Per Triage: Pt via EMS from home. Pt c/o generalized weakness since this AM. Pt states he is normally able to walk and now he cannot. Pt has a hx of parkinson. Pt is AANDOx4 and slow answer EXAM: PORTABLE CHEST 1 VIEW COMPARISON:  05/07/2021. 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 Cardiac silhouette is normal in size. Normal mediastinal and hilar contours. Clear lungs.  No pleural effusion or pneumothorax. Skeletal structures are grossly intact. IMPRESSION: No acute cardiopulmonary disease. Electronically Signed   By: Lajean Manes M.D.   On: 07/06/2021 16:01    Assessment & Plan:   Problem List Items Addressed This Visit     Bilateral leg weakness - Primary    He reports progressive weakness and recent episode of falling due to leg collapse.  Referral to  [facility]  PT for strength training       Relevant Orders   Ambulatory referral to Physical Therapy   CKD (chronic kidney disease) stage 3, GFR 30-59 ml/min (HCC)    Progressive,  With  anemia . Follow up  labs along with EPO level note improved Cr and stable hgb  Lab Results  Component Value Date   CREATININE 1.55 (H) 07/06/2021   Lab Results  Component Value Date   WBC 7.3 07/06/2021   HGB 10.9 (L) 07/06/2021   HCT 31.4 (L) 07/06/2021   MCV 96.3 07/06/2021   PLT 109 (L) 07/06/2021   .      Hypertension    He has had recurrent malaise with low BP, likely due to parkinson induced orthostasis.   Advised him to take losartan at night instead of AM.  Continue carvedilol and Imdur given CAD and angina       Parkinson's disease ([place])   Relevant Orders   AMB Referral to [facility]   Ambulatory referral to Physical Therapy    I spent 40 minutes dedicated to the care of this patient on the date of this encounter to include pre-visit review of patient's medical history,  most recent imaging studies, Face-to-face time with the patient , and post visit ordering of testing and therapeutics.    Follow-up: Return in about 4 weeks (around 08/14/2021).   [name], MD

## 2021-07-22 DIAGNOSIS — H9122 Sudden idiopathic hearing loss, left ear: Secondary | ICD-10-CM | POA: Diagnosis not present

## 2021-07-24 NOTE — Chronic Care Management (AMB) (Signed)
Chronic Care Management   Note  07/24/2021 Name: Johnathan Arnold. MRN: [id number] DOB: 06-Oct-1945  Johnathan Arnold. is a 76 y.o. year old male who is a primary care patient of Derrel Nip, Aris Everts, MD. I reached out to Johnathan Arnold. by phone today in response to a referral sent by Johnathan Arnold PCP.  Johnathan Arnold was given information about Chronic Care Management services today including:  CCM service includes personalized support from designated clinical staff supervised by his physician, including individualized plan of care and coordination with other care providers 24/7 contact phone numbers for assistance for urgent and routine care needs. Service will only be billed when office clinical staff spend 20 minutes or more in a month to coordinate care. Only one practitioner may furnish and bill the service in a calendar month. The patient may stop CCM services at any time (effective at the end of the month) by phone call to the office staff. The patient is responsible for co-pay (up to 20% after annual deductible is met) if co-pay is required by the individual health plan.   Patient did not agree to enrollment in care management services and does not wish to consider at this time.  Follow up plan: Patient declines further follow up and engagement by the care management team. Appropriate care team members and provider have been notified via electronic communication.   Noreene Larsson, [place], [place], [place] [address] Direct Dial: [phone] [email or web address]: [email or web address]

## 2021-07-28 NOTE — Progress Notes (Signed)
Assessment/Plan:   1.  Parkinsons Disease, with worsening of symptoms when he has had systemic illness  -Continue carbidopa/levodopa 25/100 to 2 at 8am, 2 at 11am, 2 at 2 pm, 1 at 5pm  -Continue carbidopa/levodopa 50/200 CR at bedtime.  -We discussed adding entacapone, 200 mg, 1 with first 2 dosages of levodopa.  Discussed risk, benefits, and side effects.  He was agreeable to try it.  -He and I discussed how systemic illness can influence Parkinson's disease.  It does not worsen the disease itself, but can bring out symptoms.   2.  RLS/RBD  -Continue clonazepam 0.5 mg, 1.5 tablets q hs.  Understands addictive postential.  PDMP reviewed  -discussed using bed rails  -melatonin 3 mg at bedtime.   3.  Sleep apnea  -On CPAP.  4.  Pulmonary hypertension  -Follows with cardiology and pulmonary  5  MCI  -pt with neurocog testing in Jan and demonstrated only MCI.  No evidence of a mood disorder.    6.  Pneumonia, possibly aspiration  -Following with pulmonary.  They stated to hold off on MBE given patient has no complaints of dysphagia.  7.  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  -His blood pressure is too high to put him on any medicine if Neurogenic Orthostatic Hypotension contributes.  -Patient on amlodipine, Lasix, losartan, Imdur.  Wonder if the strict blood pressure control is responsible for the "episodes" that he is experiencing in which he feels weak and then a few hours later feels better.  Discussed concept of permissive hypertension and Parkinson's disease, but also would like to leave blood pressure control to his cardiologist and primary care physician.  Will send them a copy of notes and let patient follow-up with them as well.   Subjective:   Johnathan Arnold. was seen today in follow up for  Parkinsons disease.  My previous records were reviewed prior to todays visit as well as outside records available to me. pts wife with patient and supplements the hx.  Pt in the hospital in December for weakness after GI illness and bradycardia.  Carvedilol d/c that admission.  Back in ER very end of Jan for viral gastroenteritis/feeling of weakness after diarrheal illness (similar to prior admission c/o).  Pt feels covid booster at end of December may have contributed to increasing weakness.  Saw Dr. Derrel Nip for f/u on 2/8 and she referred him for PT. She wrote for him to continue the carvidilol (previous hospital notes said it was d/c and pt reports he is not on it).  He does state that amlodipine was added.  Reports few falls since last visit - one was related to stepping off of a curb and falling.  He states that he will have a spell of weakness and after a few hours it will go away.  He has wondered if it is related to his blood pressure.  States that he generally feels much better when his blood pressure is in the mid 130s to 140s.  He noticed at home that he checked it and it was in the 140s sitting and in the 120s standing.  He wonders what happens when it is lower when he is sitting and then stands.  Current prescribed movement disorder medications: Carbidopa/levodopa 25/100, 2/2/2/1 Carbidopa/levodopa 50/200 CR at bedtime  Clonazepam, 0.5 mg, 1.5 tablets  nightly     ALLERGIES:  No Known Allergies  CURRENT MEDICATIONS:  Outpatient Encounter Medications as of 07/30/2021  Medication Sig   amLODipine (NORVASC) 10 MG tablet Take 1 tablet (10 mg total) by mouth daily.   aspirin EC 81 MG tablet Take 1 tablet (81 mg total) by mouth at bedtime.   carbidopa-levodopa (SINEMET IR) 25-100 MG tablet Take 2 tablets by mouth 3 (three) times daily. 2 at 8am, 2 at 11am, 2 at 2 pm, 1 at 5pm   clonazePAM (KLONOPIN) 0.5 MG tablet TAKE 1 AND 1/2 TABLET BY MOUTH EVERY NIGHT AT BEDTIME   furosemide (LASIX) 20 MG  tablet TAKE ONE TABLET BY MOUTH DAILY (Patient taking differently: 40 mg.)   isosorbide mononitrate (IMDUR) 60 MG 24 hr tablet TAKE ONE TABLET BY MOUTH DAILY   losartan (COZAAR) 25 MG tablet Take 1 tablet (25 mg total) by mouth daily.   melatonin 3 MG TABS tablet Take 3 mg by mouth at bedtime.   Multiple Vitamin (MULTIVITAMIN) tablet Take 1 tablet by mouth daily.   omeprazole (PRILOSEC) 20 MG capsule TAKE ONE CAPSULE BY MOUTH EVERY MORNING   polyethylene glycol (MIRALAX / GLYCOLAX) packet Take 17 g by mouth daily.    ranolazine (RANEXA) 1000 MG SR tablet Take 1 tablet (1,000 mg total) by mouth 2 (two) times daily.   rosuvastatin (CRESTOR) 10 MG tablet TAKE ONE TABLET BY MOUTH DAILY   tamsulosin (FLOMAX) 0.4 MG CAPS capsule Take 1 capsule (0.4 mg total) by mouth daily.   nitroGLYCERIN (NITROSTAT) 0.4 MG SL tablet Place 1 tablet (0.4 mg total) under the tongue every 5 (five) minutes as needed for chest pain.   [DISCONTINUED] ondansetron (ZOFRAN-ODT) 4 MG disintegrating tablet Take 1 tablet (4 mg total) by mouth every 8 (eight) hours as needed for nausea or vomiting.   No facility-administered encounter medications on file as of 07/30/2021.    Objective:   PHYSICAL EXAMINATION:    VITALS:   Vitals:   07/30/21 1449  BP: 139/68  Pulse: 63  SpO2: 100%  Weight: 174 lb 9.6 oz (79.2 kg)  Height: 5\' 5"  (1.651 m)   Orthostatic VS for the past 72 hrs (Last 3 readings):  Orthostatic BP Patient Position BP Location Orthostatic Pulse  07/30/21 1516 122/62 Standing Left Arm 66  07/30/21 1515 132/68 Sitting Left Arm 66  07/30/21 1514 138/72 Supine Left Arm 62      GEN:  The patient appears stated age and is in NAD. HEENT:  Normocephalic, atraumatic.  The mucous membranes are moist. The superficial temporal arteries are without ropiness or tenderness.  CV:  Brady at 44bpm.  Regular Lungs: CTAB  Neurological examination:  Orientation: The patient is alert and oriented x3. Cranial nerves:  There is good facial symmetry with facial hypomimia. The speech is fluent and clear. Soft palate rises symmetrically and there is no tongue deviation. Hearing is intact to conversational tone. Sensation: Sensation is intact to light touch throughout Motor: Strength is at least antigravity x4.  Movement examination: Tone: There is nl tone in the ue/le Abnormal movements: there is intermittent RUE rest tremor (same as previous) Coordination:  There is no decremation with RAM's, with any form of RAMS, including alternating supination and pronation of the forearm, hand opening and closing, finger taps, heel taps and toe taps. Gait and Station: The patient has no difficulty arising out of a deep-seated chair without the use of the hands. The patient's stride length is good.  Good arm swing  and no dragging of the legs today    Total time spent on today's visit was 31 minutes, including both face-to-face time and nonface-to-face time.  Time included that spent on review of records (prior notes available to me/labs/imaging if pertinent), discussing treatment and goals, answering patient's questions and coordinating care.  Cc:  [name], MD

## 2021-07-29 DIAGNOSIS — I10 Essential (primary) hypertension: Secondary | ICD-10-CM | POA: Diagnosis not present

## 2021-07-29 DIAGNOSIS — I272 Pulmonary hypertension, unspecified: Secondary | ICD-10-CM | POA: Diagnosis not present

## 2021-07-29 DIAGNOSIS — R809 Proteinuria, unspecified: Secondary | ICD-10-CM | POA: Diagnosis not present

## 2021-07-29 DIAGNOSIS — N1832 Chronic kidney disease, stage 3b: Secondary | ICD-10-CM | POA: Diagnosis not present

## 2021-07-29 DIAGNOSIS — R600 Localized edema: Secondary | ICD-10-CM | POA: Diagnosis not present

## 2021-07-30 ENCOUNTER — Ambulatory Visit (INDEPENDENT_AMBULATORY_CARE_PROVIDER_SITE_OTHER): Payer: Medicare Other | Admitting: Neurology

## 2021-07-30 ENCOUNTER — Other Ambulatory Visit: Payer: Self-pay

## 2021-07-30 ENCOUNTER — Encounter: Payer: Self-pay | Admitting: Neurology

## 2021-07-30 VITALS — BP 139/68 | HR 63 | Ht 65.0 in | Wt 174.6 lb

## 2021-07-30 DIAGNOSIS — G20 Parkinson's disease: Secondary | ICD-10-CM

## 2021-07-30 DIAGNOSIS — H9122 Sudden idiopathic hearing loss, left ear: Secondary | ICD-10-CM | POA: Diagnosis not present

## 2021-07-30 DIAGNOSIS — R531 Weakness: Secondary | ICD-10-CM

## 2021-07-30 DIAGNOSIS — Z0289 Encounter for other administrative examinations: Secondary | ICD-10-CM

## 2021-07-30 MED ORDER — ENTACAPONE 200 MG PO TABS: 200.0000 mg | ORAL_TABLET | Freq: Two times a day (BID) | ORAL | 1 refills | Status: DC

## 2021-07-30 MED ORDER — CARBIDOPA-LEVODOPA ER 50-200 MG PO TBCR: 1.0000 | EXTENDED_RELEASE_TABLET | Freq: Every day | ORAL | 1 refills | Status: DC

## 2021-07-30 NOTE — Patient Instructions (Signed)
Continue carbidopa/levodopa 25/100 to 2 at 8am, 2 at 11am, 2 at 2 pm, 1 at 5pm 2.  Continue carbidopa/levodopa 50/200 CR at bedtime. 3.  ADD entacapone, 200 mg, 1 capsule with first 2 dosages of levodopa.

## 2021-08-04 ENCOUNTER — Other Ambulatory Visit: Payer: Self-pay | Admitting: Internal Medicine

## 2021-08-05 DIAGNOSIS — H903 Sensorineural hearing loss, bilateral: Secondary | ICD-10-CM | POA: Diagnosis not present

## 2021-08-05 DIAGNOSIS — H90A22 Sensorineural hearing loss, unilateral, left ear, with restricted hearing on the contralateral side: Secondary | ICD-10-CM | POA: Diagnosis not present

## 2021-08-06 ENCOUNTER — Encounter: Payer: Self-pay | Admitting: Cardiovascular Disease

## 2021-08-06 ENCOUNTER — Ambulatory Visit (INDEPENDENT_AMBULATORY_CARE_PROVIDER_SITE_OTHER): Payer: Medicare Other | Admitting: Cardiovascular Disease

## 2021-08-06 ENCOUNTER — Other Ambulatory Visit: Payer: Self-pay

## 2021-08-06 VITALS — BP 140/50 | HR 63 | Ht 66.0 in | Wt 176.4 lb

## 2021-08-06 DIAGNOSIS — I779 Disorder of arteries and arterioles, unspecified: Secondary | ICD-10-CM | POA: Diagnosis not present

## 2021-08-06 DIAGNOSIS — I272 Pulmonary hypertension, unspecified: Secondary | ICD-10-CM | POA: Diagnosis not present

## 2021-08-06 DIAGNOSIS — I10 Essential (primary) hypertension: Secondary | ICD-10-CM

## 2021-08-06 DIAGNOSIS — I25118 Atherosclerotic heart disease of native coronary artery with other forms of angina pectoris: Secondary | ICD-10-CM | POA: Diagnosis not present

## 2021-08-06 DIAGNOSIS — E785 Hyperlipidemia, unspecified: Secondary | ICD-10-CM | POA: Diagnosis not present

## 2021-08-06 MED ORDER — AMLODIPINE BESYLATE 2.5 MG PO TABS: 2.5000 mg | ORAL_TABLET | Freq: Every day | ORAL | 2 refills | Status: DC

## 2021-08-06 NOTE — Progress Notes (Signed)
Cardiology Office Note   Date:  08/06/2021   ID:  Johnathan Apfel., DOB 08-16-1945, MRN [id number]  PCP:  [name], MD  Cardiologist:   [name], MD   Chief Complaint  Patient presents with   Other    BP and medication issues. Meds reviewed verbally with pt.       History of Present Illness: Johnathan Musto. is a 76 y.o. male who presents for a followup visit.  He has known history of coronary artery disease status post CABG in 1999. He also has known history of hypertension, chronic kidney disease, hyperlipidemia, Parkinson's disease, sleep apnea on CPAP and type 2 diabetes.  Previous carotid Doppler showed mild nonobstructive bilateral disease.  A right and left cardiac catheterization was done in August 2019  showed occluded native arteries with patent grafts including LIMA to LAD, SVG to diagonal, SVG to OM and SVG to distal RCA.  Right heart catheterization showed normal filling pressures, mild pulmonary hypertension and normal cardiac output. He has chronic bilateral exertional leg pain with normal lower extremity arterial Doppler in 2018.    He has known history of moderate to severe pulmonary hypertension. VQ scan was low probability for pulmonary embolism. Right heart catheterization in October of 2020 showed moderate pulmonary hypertension at 54 /13 mmHg with wedge pressure of 17 mmHg.  Pulmonary vascular resistance was 2.22 [place] units.  Pulmonary hypertension was felt to be of a mixed etiology.   He has underlying sleep apnea but uses CPAP on a regular basis.  He was hospitalized in December with generalized weakness and a fall.  It was felt to be due mostly to medications including benzodiazepines and antihypertensive medications.  Brain MRI was negative.  CT chest was negative for PE or pneumonia.  He did have bradycardia in the 40s and thus carvedilol was discontinued. His Parkinson's disease has been worsening and he follows closely with Dr. Carles Collet.   He continues to complain of weakness and orthostatic dizziness likely due to neurogenic orthostatic hypotension.  He denies chest pain or shortness of breath.  He reports some improvement in orthostatic dizziness but symptoms did not resolve.  He follows with Dr. Candiss Norse for chronic kidney disease.  The dose of furosemide was recently increased to 40 mg daily due to worsening leg edema.  Past Medical History:  Diagnosis Date   3-vessel coronary artery disease    s/p  5 vessel CABG   Diabetes mellitus without complication ([place])    History of cardiac catheterization 2011   [facility]   Hyperlipidemia    Hypertension    Hypertriglyceridemia    Parkinson's disease ([place])    Pneumonia 12/28/2020   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 LIMA to LAD, SVG to D1, OM1 and RPDA   CORONARY ARTERY BYPASS GRAFT  03/1998   5 vessel, [facility]   RIGHT HEART CATH N/A 04/04/2019   Procedure: RIGHT HEART CATH;  Surgeon: Wellington Hampshire, MD;  Location: [place] [facility];  Service: Cardiovascular;  Laterality: N/A;   RIGHT/LEFT HEART CATH AND CORONARY ANGIOGRAPHY N/A 02/01/2018   Procedure: RIGHT/LEFT HEART CATH AND CORONARY ANGIOGRAPHY;  Surgeon: Wellington Hampshire, MD;  Location: [place] [facility];  Service: Cardiovascular;  Laterality: N/A;     Current Outpatient Medications  Medication Sig Dispense Refill   aspirin  EC 81 MG tablet Take 1 tablet (81 mg total) by mouth at bedtime. 30 tablet 11   carbidopa-levodopa (SINEMET CR) 50-200 MG tablet Take 1 tablet by mouth at bedtime. 90 tablet 1   carbidopa-levodopa (SINEMET IR) 25-100 MG tablet Take 2 tablets by mouth 3 (three) times daily. 2 at 8am, 2 at 11am, 2 at 2 pm, 1 at 5pm 630 tablet 1   clonazePAM (KLONOPIN) 0.5 MG tablet TAKE 1 AND 1/2 TABLET BY MOUTH EVERY NIGHT AT BEDTIME 45 tablet 4   furosemide (LASIX) 40 MG tablet Take 40 mg by  mouth daily.     isosorbide mononitrate (IMDUR) 60 MG 24 hr tablet TAKE ONE TABLET BY MOUTH DAILY 90 tablet 1   losartan (COZAAR) 25 MG tablet Take 1 tablet (25 mg total) by mouth daily. 90 tablet 1   melatonin 3 MG TABS tablet Take 3 mg by mouth at bedtime.     Multiple Vitamin (MULTIVITAMIN) tablet Take 1 tablet by mouth daily.     nitroGLYCERIN (NITROSTAT) 0.4 MG SL tablet Place 1 tablet (0.4 mg total) under the tongue every 5 (five) minutes as needed for chest pain. 25 tablet 1   omeprazole (PRILOSEC) 20 MG capsule TAKE ONE CAPSULE BY MOUTH EVERY MORNING 90 capsule 0   polyethylene glycol (MIRALAX / GLYCOLAX) packet Take 17 g by mouth daily.      ranolazine (RANEXA) 1000 MG SR tablet Take 1 tablet (1,000 mg total) by mouth 2 (two) times daily. 60 tablet 5   rosuvastatin (CRESTOR) 10 MG tablet TAKE ONE TABLET BY MOUTH DAILY 90 tablet 1   tamsulosin (FLOMAX) 0.4 MG CAPS capsule Take 1 capsule (0.4 mg total) by mouth daily. 90 capsule 6   amLODipine (NORVASC) 2.5 MG tablet Take 1 tablet (2.5 mg total) by mouth daily. 90 tablet 2   entacapone (COMTAN) 200 MG tablet Take 1 tablet (200 mg total) by mouth 2 (two) times daily. Take with first 2 dosages of carbidopa/levodopa (Patient not taking: Reported on 08/06/2021) 180 tablet 1   No current facility-administered medications for this visit.    Allergies:   Patient has no known allergies.    Social History:  The patient  reports that he has never smoked. He has been exposed to tobacco smoke. He has never used smokeless tobacco. He reports current alcohol use. He reports that he does not use drugs.   Family History:  The patient's family history includes Healthy in his son; Heart attack (age of onset: 3) in his father; Heart attack (age of onset: 26) in his mother; Heart disease in his brother and father; Hypertension in his mother.    ROS:  Please see the history of present illness.   Otherwise, review of systems are positive for none.   All  other systems are reviewed and negative.    PHYSICAL EXAM: VS:  BP (!) 140/50 (BP Location: Left Arm, Patient Position: Sitting, Cuff Size: Normal)    Pulse 63    Ht 5\' 6"  (1.676 m)    Wt 176 lb 6 oz (80 kg)    SpO2 98%    BMI 28.47 kg/m  , BMI Body mass index is 28.47 kg/m. GEN: Well nourished, well developed, in no acute distress  HEENT: normal  Neck: no JVD, carotid bruits, or masses Cardiac: RRR; no  rubs, or gallops, 2 out of 6 systolic murmur in the aortic area radiating to the carotid arteries.  Mild bilateral leg edema. Respiratory:  clear to auscultation  bilaterally, normal work of breathing GI: soft, nontender, nondistended, + BS MS: no deformity or atrophy  Skin: warm and dry, no rash Neuro:  Strength and sensation are intact Psych: euthymic mood, full affect   EKG:  EKG is not ordered today.   Recent Labs: 05/07/2021: TSH 2.674 05/08/2021: ALT 19 07/06/2021: BUN 13; Creatinine, Ser 1.55; Hemoglobin 10.9; Platelets 109; Potassium 4.6; Sodium 136    Lipid Panel    Component Value Date/Time   CHOL 122 01/24/2021 0827   TRIG 111.0 01/24/2021 0827   HDL 62.80 01/24/2021 0827   CHOLHDL 2 01/24/2021 0827   VLDL 22.2 01/24/2021 0827   LDLCALC 37 01/24/2021 0827   LDLDIRECT 38.0 08/06/2015 1118      Wt Readings from Last 3 Encounters:  08/06/21 176 lb 6 oz (80 kg)  07/30/21 174 lb 9.6 oz (79.2 kg)  07/17/21 167 lb 12.8 oz (76.1 kg)        ASSESSMENT AND PLAN:  1.  Coronary artery disease involving native coronary arteries with stable angina: Most recent cardiac catheterization in 2019 showed patent grafts.  His symptoms are well controlled with Imdur and Ranexa.  2.  Moderate pulmonary hypertension: His symptoms are overall stable and he appears to be euvolemic on current dose of furosemide.   3. Bilateral carotid artery disease:  Mild nonobstructive disease. Continue treatment of risk factors.  4. Hyperlipidemia: I reviewed most recent lipid profile which  showed an LDL of 37.  Continue treatment with rosuvastatin.  5. Essential hypertension: The patient continues to have orthostatic dizziness which is likely due to neurogenic orthostatic hypotension likely related to Parkinson's disease.  I agree with permissive hypertension to avoid these frequent episodes of dizziness.  Thus, I elected to decrease amlodipine to 2.5 mg once daily.  Hopefully decreasing the dose will also help with his lower extremity edema.    Disposition:   FU with me in 6 months  Signed,  [name], MD  08/06/2021 2:14 PM    [facility]

## 2021-08-06 NOTE — Patient Instructions (Signed)
Medication Instructions:  Your physician has recommended you make the following change in your medication:   REDUCE Amlodipine to 2.5 mg daily. An RX has been sent to your pharmacy.  *If you need a refill on your cardiac medications before your next appointment, please call your pharmacy*   Lab Work: None ordered If you have labs (blood work) drawn today and your tests are completely normal, you will receive your results only by: Johnathan Arnold (if you have MyChart) OR A paper copy in the mail If you have any lab test that is abnormal or we need to change your treatment, we will call you to review the results.   Testing/Procedures: None ordered   Follow-Up: At [facility], you and your health needs are our priority.  As part of our continuing mission to provide you with exceptional heart care, we have created designated Provider Care Teams.  These Care Teams include your primary Cardiologist (physician) and Advanced Practice Providers (APPs -  Physician Assistants and Nurse Practitioners) who all work together to provide you with the care you need, when you need it.  We recommend signing up for the patient portal called "MyChart".  Sign up information is provided on this After Visit Summary.  MyChart is used to connect with patients for Virtual Visits (Telemedicine).  Patients are able to view lab/test results, encounter notes, upcoming appointments, etc.  Non-urgent messages can be sent to your provider as well.   To learn more about what you can do with MyChart, go to [email or web address].    Your next appointment:   6 month(s)  The format for your next appointment:   In Person  Provider:   You may see [name], MD or one of the following Advanced Practice Providers on your designated Care Team:   Murray Hodgkins, NP Christell Faith, PA-C Cadence Kathlen Mody, [place]   Other Instructions N/A

## 2021-08-12 ENCOUNTER — Encounter: Payer: Self-pay | Admitting: Internal Medicine

## 2021-08-13 ENCOUNTER — Encounter: Payer: Self-pay | Admitting: Neurology

## 2021-08-20 ENCOUNTER — Ambulatory Visit: Payer: Medicare Other

## 2021-08-20 DIAGNOSIS — Z20822 Contact with and (suspected) exposure to covid-19: Secondary | ICD-10-CM | POA: Diagnosis not present

## 2021-08-26 ENCOUNTER — Other Ambulatory Visit: Payer: Self-pay | Admitting: Cardiovascular Disease

## 2021-08-26 ENCOUNTER — Telehealth: Payer: Self-pay | Admitting: Pulmonary Disease

## 2021-08-26 NOTE — Telephone Encounter (Signed)
Last OV note has been printed and placed up front for pick up. ?Patient is aware and voiced his understanding.  ?Nothing further needed.  ? ?

## 2021-08-30 ENCOUNTER — Other Ambulatory Visit: Payer: Self-pay | Admitting: Internal Medicine

## 2021-09-02 ENCOUNTER — Other Ambulatory Visit: Payer: Self-pay | Admitting: Neurology

## 2021-09-02 NOTE — Telephone Encounter (Signed)
Last office visit 07/30/2021 ?

## 2021-09-05 ENCOUNTER — Encounter: Payer: Self-pay | Admitting: Internal Medicine

## 2021-09-05 ENCOUNTER — Ambulatory Visit (INDEPENDENT_AMBULATORY_CARE_PROVIDER_SITE_OTHER): Payer: Medicare Other | Admitting: Internal Medicine

## 2021-09-05 DIAGNOSIS — D696 Thrombocytopenia, unspecified: Secondary | ICD-10-CM | POA: Diagnosis not present

## 2021-09-05 DIAGNOSIS — R531 Weakness: Secondary | ICD-10-CM

## 2021-09-05 DIAGNOSIS — E1121 Type 2 diabetes mellitus with diabetic nephropathy: Secondary | ICD-10-CM

## 2021-09-05 DIAGNOSIS — E7800 Pure hypercholesterolemia, unspecified: Secondary | ICD-10-CM | POA: Diagnosis not present

## 2021-09-05 DIAGNOSIS — R918 Other nonspecific abnormal finding of lung field: Secondary | ICD-10-CM

## 2021-09-05 DIAGNOSIS — I25118 Atherosclerotic heart disease of native coronary artery with other forms of angina pectoris: Secondary | ICD-10-CM

## 2021-09-05 DIAGNOSIS — G20 Parkinson's disease: Secondary | ICD-10-CM | POA: Diagnosis not present

## 2021-09-05 DIAGNOSIS — I10 Essential (primary) hypertension: Secondary | ICD-10-CM | POA: Diagnosis not present

## 2021-09-05 DIAGNOSIS — D709 Neutropenia, unspecified: Secondary | ICD-10-CM

## 2021-09-05 MED ORDER — ZOSTER VAC RECOMB ADJUVANTED 50 MCG/0.5ML IM SUSR: 0.5000 mL | Freq: Once | INTRAMUSCULAR | 1 refills | Status: AC

## 2021-09-05 MED ORDER — TETANUS-DIPHTH-ACELL PERTUSSIS 5-2.5-18.5 LF-MCG/0.5 IM SUSY: 0.5000 mL | PREFILLED_SYRINGE | Freq: Once | INTRAMUSCULAR | 0 refills | Status: AC

## 2021-09-05 NOTE — Patient Instructions (Addendum)
Salon Pas patch with lidocaine daily for 8 hours  ? ?You can take up to 2000 mg of acetominophen (tylenol) every day safely  In divided doses (500 mg every 6 hours  Or 1000 mg every 12 hours.)  ? ? ?Exercise: (supported back extension, standing) ? ?Stand against a counter or sofa with your buttocks resting on the edge and your hands on the edge as well on either side of your back ? ?Slowly lean backwards,  Bending from the waist, until you feel slight discomfort. ?Restore yourself to vertical position (up straight) ?Repeat the back extension 10 times , each time extending a little farther). ? ?What should you expect? ?The pain should recede from the calf/thigh/buttocks but may localize to the lower back  ?If it does not,  Or if it makes the leg pain worse, STOP doing it.   ?If it results in improvement,  Repeat the exercise every 2-3 hours while awake and STOP THE OTHER EXERCISES that do the opposite motion (back flexion)  ? ? ?If no improvement  in one week,  mychart me for PT referral  ? ?For low blood pressure: ? ?Suspend the amlodipine first.  Continue the losartan if tolerated  ?

## 2021-09-05 NOTE — Progress Notes (Signed)
? ?Subjective:  ?Patient ID: Johnathan Arnold., male    DOB: 18-Nov-1945  Age: 76 y.o. MRN: [id number] ? 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 ? ? ?This visit occurred during the SARS-CoV-2 public health emergency.  Safety protocols were in place, including screening questions prior to the visit, additional usage of staff PPE, and extensive cleaning of exam room while observing appropriate contact time as indicated for disinfecting solutions.   ? ?HPI ?Johnathan Arnold. presents for follow up on hypertension, CAD , hyperliidemia and PD ?Chief Complaint  ?Patient presents with  ? Follow-up  ?  32mof/u - pt reports feelings well. Some back pain, but not new.  ? ? ? ?He feels generally well, is exercising several times per week but limited by back pain, which is chronic  , and parkinson's disease.  No lonver checking blood sugars since he no longer meets criteria for DM based on recurrent a1cs  < 5.8 on diet alone .  ? ?HTN:     Taking losartan  25 mg and amlodipine  2.5 mg  daily as directed. Walking regularly,  has been advised to be aware of orthostatic symptoms and need to moidfy medications in the future. Reviewed the pathophysiology of PD orthostass and the effect of medications on these symptoms  ? ?He reports that he has been having lower back pain for years.  It is non radiating,  it does not include spinal tenderness.  His back pain is aggravated by prolonged standing and by any activities. Pain  is left of center at L2 level.   Had PT at  their outpatient [address] location  in the past with good results,  but has not been doing the home exercises .  ?   ?PD:  has good days and bad days energy level .  Affected by periods of low blood pressure  reviewed his medication regimen and schedule  ? ?CAD:  he denies any recent episodes of chest pressure,  jaw pain and shortness of breath.   He is tolerating losartan, ranexa crestor   ? ?Outpatient Medications Prior to Visit  ?Medication Sig Dispense Refill  ? amLODipine (NORVASC) 2.5 MG tablet Take 1 tablet (2.5 mg total) by mouth daily. 90 tablet 2  ? aspirin EC 81 MG tablet Take 1 tablet (81 mg total) by mouth at bedtime. 30 tablet 11  ? carbidopa-levodopa (SINEMET CR) 50-200 MG tablet Take 1 tablet by mouth at bedtime. 90 tablet 1  ? carbidopa-levodopa (SINEMET IR) 25-100 MG tablet TAKE TWO TABLETS BY MOUTH AT 8AM, TWO TABLETS AT 11AM, TWO TABLETS AT 2PM.  AND 1 TABLET AT 5PM. 630 tablet 0  ? clonazePAM (KLONOPIN) 0.5 MG tablet TAKE 1 AND 1/2 TABLET BY MOUTH EVERY NIGHT AT BEDTIME 45 tablet 4  ? furosemide (LASIX) 40 MG tablet Take 40 mg by mouth daily.    ? isosorbide mononitrate (IMDUR) 60 MG 24 hr tablet TAKE ONE TABLET BY MOUTH DAILY 90 tablet 1  ? losartan (COZAAR) 25 MG tablet TAKE ONE TABLET BY MOUTH DAILY 90 tablet 1  ? melatonin 3 MG TABS tablet Take 3 mg by mouth at bedtime.    ? Multiple Vitamin (MULTIVITAMIN) tablet Take 1 tablet by mouth daily.    ? nitroGLYCERIN (NITROSTAT) 0.4 MG SL tablet Place 1 tablet (  0.4 mg total) under the tongue every 5 (five) minutes as needed for chest pain. 25 tablet 1  ? omeprazole (PRILOSEC) 20 MG capsule TAKE ONE CAPSULE BY MOUTH EVERY MORNING 90 capsule 0  ? polyethylene glycol (MIRALAX / GLYCOLAX) packet Take 17 g by mouth daily.     ? ranolazine (RANEXA) 1000 MG SR tablet TAKE ONE TABLET BY MOUTH TWICE A DAY 60 tablet 4  ? rosuvastatin (CRESTOR) 10 MG tablet TAKE ONE TABLET BY MOUTH DAILY 90 tablet 1  ? tamsulosin (FLOMAX) 0.4 MG CAPS capsule Take 1 capsule (0.4 mg total) by mouth daily. 90 capsule 6  ? entacapone (COMTAN) 200 MG tablet Take 1 tablet (200 mg total) by mouth 2 (two) times daily. Take with first 2 dosages of carbidopa/levodopa (Patient not taking:  Reported on 08/06/2021) 180 tablet 1  ? ?No facility-administered medications prior to visit.  ? ? ?Review of Systems; ? ?Patient denies headache, fevers, malaise, unintentional weight loss, skin rash, eye pain, sinus congestion and sinus pain, sore throat, dysphagia,  hemoptysis , cough, dyspnea, wheezing, chest pain, palpitations, orthopnea, edema, abdominal pain, nausea, melena, diarrhea, constipation, flank pain, dysuria, hematuria, urinary  Frequency, nocturia, numbness, tingling, seizures,  Focal weakness, Loss of consciousness,  Tremor, insomnia, depression, anxiety, and suicidal ideation.   ? ? ? ?Objective:  ?BP 126/80 (BP Location: Left Arm, Patient Position: Sitting, Cuff Size: Small)   Pulse (!) 50   Temp 98.6 ?F (37 ?C) (Oral)   Ht '5\' 6"'$  (1.676 m)   Wt 173 lb 12.8 oz (78.8 kg)   SpO2 98%   BMI 28.05 kg/m?  ? ?BP Readings from Last 3 Encounters:  ?09/05/21 126/80  ?08/06/21 (!) 140/50  ?07/30/21 139/68  ? ? ?Wt Readings from Last 3 Encounters:  ?09/05/21 173 lb 12.8 oz (78.8 kg)  ?08/06/21 176 lb 6 oz (80 kg)  ?07/30/21 174 lb 9.6 oz (79.2 kg)  ? ? ?General appearance: alert, cooperative and appears stated age ?Ears: normal TM's and external ear canals both ears ?Throat: lips, mucosa, and tongue normal; teeth and gums normal ?Neck: no adenopathy, no carotid bruit, supple, symmetrical, trachea midline and thyroid not enlarged, symmetric, no tenderness/mass/nodules ?Back: symmetric, no curvature. ROM normal. No CVA tenderness. ?Lungs: clear to auscultation bilaterally ?Heart: regular rate and rhythm, S1, S2 normal, no murmur, click, rub or gallop ?Abdomen: soft, non-tender; bowel sounds normal; no masses,  no organomegaly ?Pulses: 2+ and symmetric ?Skin: Skin color, texture, turgor normal. No rashes or lesions ?Lymph nodes: Cervical, supraclavicular, and axillary nodes normal. ? ?Lab Results  ?Component Value Date  ? HGBA1C 5.4 05/07/2021  ? HGBA1C 5.3 01/24/2021  ? HGBA1C 5.8 09/06/2020  ? ? ?Lab  Results  ?Component Value Date  ? CREATININE 1.55 (H) 07/06/2021  ? CREATININE 1.74 (H) 05/08/2021  ? CREATININE 1.65 (H) 05/07/2021  ? ? ?Lab Results  ?Component Value Date  ? WBC 7.3 07/06/2021  ? HGB 10.9 (L) 07/06/2021  ? HCT 31.4 (L) 07/06/2021  ? PLT 109 (L) 07/06/2021  ? GLUCOSE 137 (H) 07/06/2021  ? CHOL 122 01/24/2021  ? TRIG 111.0 01/24/2021  ? HDL 62.80 01/24/2021  ? LDLDIRECT 38.0 08/06/2015  ? LDLCALC 37 01/24/2021  ? ALT 19 05/08/2021  ? AST 22 05/08/2021  ? NA 136 07/06/2021  ? K 4.6 07/06/2021  ? CL 105 07/06/2021  ? CREATININE 1.55 (H) 07/06/2021  ? BUN 13 07/06/2021  ? CO2 25 07/06/2021  ? TSH 2.674 05/07/2021  ? PSA 1.10 09/06/2020  ?  HGBA1C 5.4 05/07/2021  ? MICROALBUR <0.7 08/17/2019  ? ? ?DG Chest Portable 1 View ? ?Result Date: 07/06/2021 ?CLINICAL DATA:  Per Triage: Pt via EMS from home. Pt c/o generalized weakness since this AM. Pt states he is normally able to walk and now he cannot. Pt has a hx of parkinson. Pt is AANDOx4 and slow answer EXAM: PORTABLE CHEST 1 VIEW COMPARISON:  05/07/2021. 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 Cardiac silhouette is normal in size. Normal mediastinal and hilar contours. Clear lungs.  No pleural effusion or pneumothorax. Skeletal structures are grossly intact. IMPRESSION: No acute cardiopulmonary disease. Electronically Signed   By: Lajean Manes M.D.   On: 07/06/2021 16:01  ? ? ?Assessment & Plan:  ? ?Problem List Items Addressed This Visit   ? ? 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 Thrombocytopenia ([place])  ? Pulmonary nodules  ?  He has Stable nodules in the  right lung apex,  Right upper lobe, and left lower lobe , last visualized in November 2022 on chest CT.  No follow up needed per radiology  ?  ?  ? Parkinson's disease ([place])  ?  Managed with  Sinemet taken every 4 hours since a freezing episode resulted in a fall.      Attending [employer] program 3 days per week . Marland Kitchen  Advised to keep a walker near the  bed. .  ?  ?  ? Neutropenia ([place])  ? Hypertension  ?  Advised to suspend amlodipine first if he develops orthostasis .  Continue losartan 25 mg if tolerated  ?  ?  ? Hyperlipidemia  ?  LDL remains > 70  on current t

## 2021-09-06 NOTE — Assessment & Plan Note (Signed)
LDL remains > 70  on current therapy, rosuvastatin 10 mg daily ? ?Lab Results  ?Component Value Date  ? CHOL 122 01/24/2021  ? HDL 62.80 01/24/2021  ? LDLCALC 37 01/24/2021  ? LDLDIRECT 38.0 08/06/2015  ? TRIG 111.0 01/24/2021  ? CHOLHDL 2 01/24/2021  ? ? ?

## 2021-09-06 NOTE — Assessment & Plan Note (Signed)
Improving with "[employer]" program participation.  Recommended repeat referral to PT for back pain  ?

## 2021-09-06 NOTE — Assessment & Plan Note (Signed)
Advised to suspend amlodipine first if he develops orthostasis .  Continue losartan 25 mg if tolerated  ?

## 2021-09-06 NOTE — Assessment & Plan Note (Signed)
Managed with  Sinemet taken every 4 hours since a freezing episode resulted in a fall.      Attending [employer] program 3 days per week . Marland Kitchen  Advised to keep a walker near the bed. Marland Kitchen  ?

## 2021-09-06 NOTE — Assessment & Plan Note (Addendum)
He has Stable nodules in the  right lung apex,  Right upper lobe, and left lower lobe , last visualized in November 2022 on chest CT.  No follow up needed per radiology  ?

## 2021-09-23 DIAGNOSIS — Z20822 Contact with and (suspected) exposure to covid-19: Secondary | ICD-10-CM | POA: Diagnosis not present

## 2021-09-26 DIAGNOSIS — Z20822 Contact with and (suspected) exposure to covid-19: Secondary | ICD-10-CM | POA: Diagnosis not present

## 2021-09-30 DIAGNOSIS — R051 Acute cough: Secondary | ICD-10-CM | POA: Diagnosis not present

## 2021-09-30 DIAGNOSIS — R059 Cough, unspecified: Secondary | ICD-10-CM | POA: Diagnosis not present

## 2021-09-30 DIAGNOSIS — Z20822 Contact with and (suspected) exposure to covid-19: Secondary | ICD-10-CM | POA: Diagnosis not present

## 2021-10-03 ENCOUNTER — Other Ambulatory Visit: Payer: Self-pay | Admitting: Cardiovascular Disease

## 2021-10-05 ENCOUNTER — Other Ambulatory Visit: Payer: Self-pay | Admitting: Cardiovascular Disease

## 2021-10-14 DIAGNOSIS — Z20822 Contact with and (suspected) exposure to covid-19: Secondary | ICD-10-CM | POA: Diagnosis not present

## 2021-10-16 DIAGNOSIS — Z20822 Contact with and (suspected) exposure to covid-19: Secondary | ICD-10-CM | POA: Diagnosis not present

## 2021-10-18 ENCOUNTER — Other Ambulatory Visit: Payer: Self-pay | Admitting: Neurology

## 2021-10-24 DIAGNOSIS — R600 Localized edema: Secondary | ICD-10-CM | POA: Diagnosis not present

## 2021-10-24 DIAGNOSIS — R809 Proteinuria, unspecified: Secondary | ICD-10-CM | POA: Diagnosis not present

## 2021-10-24 DIAGNOSIS — N1832 Chronic kidney disease, stage 3b: Secondary | ICD-10-CM | POA: Diagnosis not present

## 2021-10-24 DIAGNOSIS — I272 Pulmonary hypertension, unspecified: Secondary | ICD-10-CM | POA: Diagnosis not present

## 2021-10-24 DIAGNOSIS — I10 Essential (primary) hypertension: Secondary | ICD-10-CM | POA: Diagnosis not present

## 2021-10-28 DIAGNOSIS — D631 Anemia in chronic kidney disease: Secondary | ICD-10-CM | POA: Diagnosis not present

## 2021-10-28 DIAGNOSIS — R600 Localized edema: Secondary | ICD-10-CM | POA: Diagnosis not present

## 2021-10-28 DIAGNOSIS — I10 Essential (primary) hypertension: Secondary | ICD-10-CM | POA: Diagnosis not present

## 2021-10-28 DIAGNOSIS — N1832 Chronic kidney disease, stage 3b: Secondary | ICD-10-CM | POA: Diagnosis not present

## 2021-11-04 ENCOUNTER — Other Ambulatory Visit: Payer: Self-pay | Admitting: Internal Medicine

## 2021-11-12 ENCOUNTER — Telehealth: Payer: Self-pay | Admitting: Internal Medicine

## 2021-11-12 DIAGNOSIS — E119 Type 2 diabetes mellitus without complications: Secondary | ICD-10-CM | POA: Diagnosis not present

## 2021-11-12 NOTE — Telephone Encounter (Signed)
Copied from [place]. Topic: Medicare AWV >> Nov 12, 2021 11:50 AM Harris-Coley, Hannah Beat wrote: Reason for CRM: Left message for patient to schedule Annual Wellness Visit.  Please schedule with Nurse Health Advisor Denisa O'Brien-Blaney, LPN at [facility].  Please call [phone] ask for [facility]

## 2021-11-20 ENCOUNTER — Other Ambulatory Visit: Payer: Self-pay | Admitting: Cardiovascular Disease

## 2021-11-27 ENCOUNTER — Other Ambulatory Visit: Payer: Self-pay | Admitting: Neurology

## 2021-11-28 DIAGNOSIS — Z01818 Encounter for other preprocedural examination: Secondary | ICD-10-CM | POA: Diagnosis not present

## 2021-11-28 DIAGNOSIS — H2512 Age-related nuclear cataract, left eye: Secondary | ICD-10-CM | POA: Diagnosis not present

## 2021-11-28 DIAGNOSIS — H2511 Age-related nuclear cataract, right eye: Secondary | ICD-10-CM | POA: Diagnosis not present

## 2021-11-29 ENCOUNTER — Other Ambulatory Visit: Payer: Self-pay | Admitting: Cardiovascular Disease

## 2021-12-03 DIAGNOSIS — H903 Sensorineural hearing loss, bilateral: Secondary | ICD-10-CM | POA: Diagnosis not present

## 2021-12-03 DIAGNOSIS — R42 Dizziness and giddiness: Secondary | ICD-10-CM | POA: Diagnosis not present

## 2021-12-13 ENCOUNTER — Ambulatory Visit (INDEPENDENT_AMBULATORY_CARE_PROVIDER_SITE_OTHER): Payer: Medicare Other

## 2021-12-13 VITALS — Ht 66.0 in | Wt 173.0 lb

## 2021-12-13 DIAGNOSIS — Z0000 Encounter for general adult medical examination without abnormal findings: Secondary | ICD-10-CM

## 2021-12-13 NOTE — Progress Notes (Signed)
Subjective:   Johnathan Arnold. is a 76 y.o. male who presents for Medicare Annual/Subsequent preventive examination.  Review of Systems    No ROS.  Medicare Wellness Virtual Visit.  Visual/audio telehealth visit, UTA vital signs.   See social history for additional risk factors.   Cardiac Risk Factors include: advanced age (>47mn, >>35women);male gender;hypertension     Objective:    Today's Vitals   12/13/21 1344  Weight: 173 lb (78.5 kg)  Height: '5\' 6"'$  (1.676 m)   Body mass index is 27.92 kg/m.     12/13/2021    1:56 PM 07/06/2021   10:39 AM 05/07/2021    4:12 AM 03/07/2021    1:01 PM 02/07/2021    3:05 PM 01/29/2021    8:52 AM 01/24/2021    9:43 AM  Advanced Directives  Does Patient Have a Medical Advance Directive? Yes No No Yes Yes Yes Yes  Type of Advance Directive    Living will Living will Living will Living will  Does patient want to make changes to medical advance directive? No - Patient declined    No - Patient declined No - Patient declined     Current Medications (verified) Outpatient Encounter Medications as of 12/13/2021  Medication Sig   amLODipine (NORVASC) 2.5 MG tablet Take 1 tablet (2.5 mg total) by mouth daily.   aspirin EC 81 MG tablet Take 1 tablet (81 mg total) by mouth at bedtime.   carbidopa-levodopa (SINEMET CR) 50-200 MG tablet Take 1 tablet by mouth at bedtime.   carbidopa-levodopa (SINEMET IR) 25-100 MG tablet TAKE TWO TABLETS BY MOUTH AT 8AM; TAKE TWO TABLETS BY MOUTH AT 11AM; TAKE TWO TABLETS BY MOUTH AT 2PM.; AND TAKE ONE TABLET BY MOUTH AT 5PM.   clonazePAM (KLONOPIN) 0.5 MG tablet TAKE 1 AND 1/2 TABLET BY MOUTH EVERY NIGHT AT BEDTIME   entacapone (COMTAN) 200 MG tablet Take 1 tablet (200 mg total) by mouth 2 (two) times daily. Take with first 2 dosages of carbidopa/levodopa (Patient not taking: Reported on 08/06/2021)   furosemide (LASIX) 40 MG tablet Take 40 mg by mouth daily.   isosorbide mononitrate (IMDUR) 60 MG 24 hr tablet TAKE ONE  TABLET BY MOUTH DAILY   losartan (COZAAR) 25 MG tablet TAKE ONE TABLET BY MOUTH DAILY   melatonin 3 MG TABS tablet Take 3 mg by mouth at bedtime.   Multiple Vitamin (MULTIVITAMIN) tablet Take 1 tablet by mouth daily.   nitroGLYCERIN (NITROSTAT) 0.4 MG SL tablet Place 1 tablet (0.4 mg total) under the tongue every 5 (five) minutes as needed for chest pain.   omeprazole (PRILOSEC) 20 MG capsule TAKE ONE CAPSULE BY MOUTH EVERY MORNING   polyethylene glycol (MIRALAX / GLYCOLAX) packet Take 17 g by mouth daily.    ranolazine (RANEXA) 1000 MG SR tablet TAKE ONE TABLET BY MOUTH TWICE A DAY   rosuvastatin (CRESTOR) 10 MG tablet TAKE ONE TABLET BY MOUTH DAILY   tamsulosin (FLOMAX) 0.4 MG CAPS capsule Take 1 capsule (0.4 mg total) by mouth daily.   No facility-administered encounter medications on file as of 12/13/2021.    Allergies (verified) Patient has no known allergies.   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 LIMA to LAD, SVG to D1, OM1 and RPDA   CORONARY ARTERY BYPASS GRAFT  03/1998   5 vessel, [facility]   RIGHT HEART CATH N/A 04/04/2019   Procedure: RIGHT HEART CATH;  Surgeon: Wellington Hampshire, MD;  Location: [place] [facility];  Service: Cardiovascular;  Laterality: N/A;   RIGHT/LEFT HEART CATH AND CORONARY ANGIOGRAPHY N/A 02/01/2018   Procedure: RIGHT/LEFT HEART CATH AND CORONARY ANGIOGRAPHY;  Surgeon: Wellington Hampshire, MD;  Location: [place] [facility];  Service: Cardiovascular;  Laterality: N/A;   Family History  Problem Relation Age of Onset   Heart  attack Mother 27   Hypertension Mother    Heart attack Father 30   Heart disease Father    Heart disease Brother    Healthy Son    Social History   Socioeconomic History   Marital status: Married    Spouse name: Not on file   Number of children: 2   Years of education: Not on file   Highest education level: Master's degree (e.g., MA, MS, MEng, MEd, MSW, MBA)  Occupational History   Occupation: retired    Comment: IT work  Tobacco Use   Smoking status: Never    Passive exposure: Yes   Smokeless tobacco: Never  Vaping Use   Vaping Use: Never used  Substance and Sexual Activity   Alcohol use: Yes    Comment: occasional beer   Drug use: No   Sexual activity: Not Currently  Other Topics Concern   Not on file  Social History Narrative   Right Handed    Lives in a two story home    Social Determinants of Health   Financial Resource Strain: Low Risk  (12/13/2021)   Overall Financial Resource Strain (CARDIA)    Difficulty of Paying Living Expenses: Not hard at all  Food Insecurity: No Food Insecurity (12/13/2021)   Hunger Vital Sign    Worried About Running Out of Food in the Last Year: Never true    Foster in the Last Year: Never true  Transportation Needs: No Transportation Needs (12/13/2021)   PRAPARE - Hydrologist (Medical): No    Lack of Transportation (Non-Medical): No  Physical Activity: Sufficiently Active (12/13/2021)   Exercise Vital Sign    Days of Exercise per Week: 3 days    Minutes of Exercise per Session: 60 min  Stress: No Stress Concern Present (12/13/2021)   [place]    Feeling of Stress : Not at all  Social Connections: Unknown (12/13/2021)   Social Connection and Isolation Panel [NHANES]    Frequency of Communication with Friends and Family: More than three times a week    Frequency of Social Gatherings with Friends and Family: More than three times a week     Attends Religious Services: Not on Advertising copywriter or Organizations: Yes    Attends Music therapist: More than 4 times per year    Marital Status: Married    Tobacco Counseling Counseling given: Not Answered   Clinical Intake:  Pre-visit preparation completed: Yes        Diabetes: No  How often do you need to have someone  help you when you read instructions, pamphlets, or other written materials from your doctor or pharmacy?: 1 - Never  Interpreter Needed?: No      Activities of Daily Living    12/13/2021    1:35 PM 05/09/2021    9:33 AM  In your present state of health, do you have any difficulty performing the following activities:  Hearing? 1 1  Comment Hearing aids   Vision? 0 0  Difficulty concentrating or making decisions? 0 0  Walking or climbing stairs? 0 0  Dressing or bathing? 0 0  Doing errands, shopping? 0   Preparing Food and eating ? N   Using the Toilet? N   In the past six months, have you accidently leaked urine? N   Do you have problems with loss of bowel control? N   Managing your Medications? N   Managing your Finances? N   Housekeeping or managing your Housekeeping? N    Patient Care Team: [name], MD as PCP - General (Internal Medicine) Wellington Hampshire, MD as PCP - Cardiology (Cardiology) Isaias Cowman, MD (Internal Medicine) Tat, Eustace Quail, DO as Consulting Physician (Neurology)  Indicate any recent Medical Services you may have received from other than [facility] providers in the past year (date may be approximate).     Assessment:   This is a routine wellness examination for [place].  Virtual Visit via Telephone Note  I connected with  Johnathan Arnold. on 12/13/21 at  1:30 PM EDT by telephone and verified that I am speaking with the correct person using two identifiers.  Persons participating in the virtual visit: patient/Nurse Health Advisor   I discussed the limitations of  performing an evaluation and management service by telehealth. We continued and completed visit with audio only. Some vital signs may be absent or patient reported.   Hearing/Vision screen Hearing Screening - Comments:: Hearing aids, bilateral Vision Screening - Comments:: Followed by [facility] or   Wears corrective lenses Last OV 2018 Visual acuity not assessed per patient preference since they have regular follow up with the ophthalmologist  Dietary issues and exercise activities discussed: Current Exercise Habits: Home exercise routine, Time (Minutes): 60, Frequency (Times/Week): 3, Weekly Exercise (Minutes/Week): 180, Intensity: Mild   Goals Addressed               This Visit's Progress     Patient Stated     Healthy Lifestyle (pt-stated)   On track     Stay active.        Depression Screen    12/13/2021    1:35 PM 05/16/2021   11:05 AM 03/08/2021   10:03 AM 12/20/2020   12:29 PM 09/06/2020   10:28 AM 08/17/2020   11:28 AM 08/17/2019   10:11 AM  PHQ 2/9 Scores  PHQ - 2 Score 0 0 0 0 0 0 0  PHQ- 9 Score     2  1    Fall Risk    12/13/2021    1:35 PM 09/05/2021    8:34 AM 07/30/2021    2:32 PM 05/16/2021   11:04 AM 03/08/2021   10:02 AM  Fall Risk   Falls in the past year? 0 0 '1 1 1  '$ Number falls in past yr:   1 0 1  Injury with Fall?   0 1 0  Risk for fall due to :  No Fall Risks  History of fall(s) History of fall(s)  Follow up Falls evaluation  completed Falls evaluation completed  Falls evaluation completed Falls evaluation completed    FALL RISK PREVENTION PERTAINING TO THE HOME: Home free of loose throw rugs in walkways, pet beds, electrical cords, etc? Yes  Adequate lighting in your home to reduce risk of falls? Yes   ASSISTIVE DEVICES UTILIZED TO PREVENT FALLS: Life alert? No  Use of a cane, walker or w/c? No   TIMED UP AND GO: Was the test performed? No .   Cognitive Function: Patient is alert and oriented x3.      08/05/2016   10:28 AM   MMSE - Mini Mental State Exam  Orientation to time 5  Orientation to Place 5  Registration 3  Attention/ Calculation 5  Recall 3  Language- name 2 objects 2  Language- repeat 1  Language- follow 3 step command 3  Language- read & follow direction 1  Write a sentence 1  Copy design 1  Total score 30      07/03/2020   10:00 AM  Montreal Cognitive Assessment   Visuospatial/ Executive (0/5) 2  Naming (0/3) 3  Attention: Read list of digits (0/2) 2  Attention: Read list of letters (0/1) 1  Attention: Serial 7 subtraction starting at 100 (0/3) 2  Language: Repeat phrase (0/2) 0  Language : Fluency (0/1) 0  Abstraction (0/2) 2  Delayed Recall (0/5) 3  Orientation (0/6) 5  Total 20  Adjusted Score (based on education) 20      08/17/2019   12:50 PM 08/13/2018   10:00 AM 08/11/2017   10:33 AM  6CIT Screen  What Year? 0 points 0 points 0 points  What month? 0 points 0 points 0 points  What time? 0 points 0 points 0 points  Count back from 20 0 points 0 points 0 points  Months in reverse 0 points 0 points 0 points  Repeat phrase 0 points 0 points   Total Score 0 points 0 points     Immunizations Immunization History  Administered Date(s) Administered   Fluad Quad(high Dose 65+) 03/09/2019, 05/16/2021   Influenza Inj Mdck Quad Pf 03/01/2020   Influenza Split 02/19/2011, 04/10/2014   Influenza, High Dose Seasonal PF 04/01/2013, 02/28/2015, 05/17/2018   Influenza,inj,Quad PF,6+ Mos 03/10/2016, 03/13/2017   Influenza-Unspecified 03/09/2012   PFIZER(Purple Top)SARS-COV-2 Vaccination 07/15/2019, 08/05/2019, 04/01/2020   Pneumococcal Conjugate-13 06/30/2013   Pneumococcal Polysaccharide-23 05/21/2011, 08/06/2016   Tdap 12/19/2010   Zoster Recombinat (Shingrix) 12/07/2021   Zoster, Live 05/09/2012   TDAP status: Due, Education has been provided regarding the importance of this vaccine. Advised may receive this vaccine at local pharmacy or Health Dept. Aware to provide a copy of  the vaccination record if obtained from local pharmacy or Health Dept. Verbalized acceptance and understanding.  Screening Tests Health Maintenance  Topic Date Due   COVID-19 Vaccine (4 - Pfizer series) 12/29/2021 (Originally 05/27/2020)   FOOT EXAM  02/07/2022 (Originally 09/06/2021)   HEMOGLOBIN A1C  02/07/2022 (Originally 11/04/2021)   TETANUS/TDAP  12/14/2022 (Originally 12/18/2020)   INFLUENZA VACCINE  01/07/2022   Zoster Vaccines- Shingrix (2 of 2) 02/01/2022   OPHTHALMOLOGY EXAM  11/22/2022   Pneumonia Vaccine 33+ Years old  Completed   Hepatitis C Screening  Completed   HPV VACCINES  Aged Out   COLONOSCOPY (Pts 45-56yr Insurance coverage will need to be confirmed)  Discontinued   Fecal DNA (Cologuard)  Discontinued   Health Maintenance There are no preventive care reminders to display for this patient.  Lung Cancer Screening: (Low Dose  CT Chest recommended if Age 52-80 years, 30 pack-year currently smoking OR have quit w/in 15years.) does not qualify.   Vision Screening: Recommended annual ophthalmology exams for early detection of glaucoma and other disorders of the eye.  Dental Screening: Recommended annual dental exams for proper oral hygiene  Community Resource Referral / Chronic Care Management: CRR required this visit?  No   CCM required this visit?  No      Plan:   Keep all routine maintenance appointments.   I have personally reviewed and noted the following in the patient's chart:   Medical and social history Use of alcohol, tobacco or illicit drugs  Current medications and supplements including opioid prescriptions. Patient is not currently taking opioid prescriptions. Functional ability and status Nutritional status Physical activity Advanced directives List of other physicians Hospitalizations, surgeries, and ER visits in previous 12 months Vitals Screenings to include cognitive, depression, and falls Referrals and appointments  In addition, I  have reviewed and discussed with patient certain preventive protocols, quality metrics, and best practice recommendations. A written personalized care plan for preventive services as well as general preventive health recommendations were provided to patient.     Varney Biles, LPN   01/11/1323

## 2021-12-13 NOTE — Patient Instructions (Addendum)
  Mr. Slaven , Thank you for taking time to come for your Medicare Wellness Visit. I appreciate your ongoing commitment to your health goals. Please review the following plan we discussed and let me know if I can assist you in the future.   These are the goals we discussed:  Goals       Patient Stated     Healthy Lifestyle (pt-stated)      Stay active.         This is a list of the screening recommended for you and due dates:  Health Maintenance  Topic Date Due   COVID-19 Vaccine (4 - Pfizer series) 12/29/2021*   Complete foot exam   02/07/2022*   Hemoglobin A1C  02/07/2022*   Tetanus Vaccine  12/14/2022*   Flu Shot  01/07/2022   Zoster (Shingles) Vaccine (2 of 2) 02/01/2022   Eye exam for diabetics  11/22/2022   Pneumonia Vaccine  Completed   Hepatitis C Screening: USPSTF Recommendation to screen - Ages 56-79 yo.  Completed   HPV Vaccine  Aged Out   Colon Cancer Screening  Discontinued   Cologuard (Stool DNA test)  Discontinued  *Topic was postponed. The date shown is not the original due date.

## 2021-12-16 DIAGNOSIS — H269 Unspecified cataract: Secondary | ICD-10-CM | POA: Diagnosis not present

## 2021-12-16 DIAGNOSIS — H2511 Age-related nuclear cataract, right eye: Secondary | ICD-10-CM | POA: Diagnosis not present

## 2021-12-24 ENCOUNTER — Other Ambulatory Visit: Payer: Self-pay

## 2021-12-24 ENCOUNTER — Ambulatory Visit: Payer: Medicare Other | Attending: Internal Medicine | Admitting: Physical Therapy

## 2021-12-24 DIAGNOSIS — G20 Parkinson's disease: Secondary | ICD-10-CM | POA: Diagnosis not present

## 2021-12-24 DIAGNOSIS — R262 Difficulty in walking, not elsewhere classified: Secondary | ICD-10-CM | POA: Insufficient documentation

## 2021-12-24 DIAGNOSIS — R269 Unspecified abnormalities of gait and mobility: Secondary | ICD-10-CM | POA: Diagnosis not present

## 2021-12-24 DIAGNOSIS — R29898 Other symptoms and signs involving the musculoskeletal system: Secondary | ICD-10-CM | POA: Diagnosis not present

## 2021-12-24 DIAGNOSIS — M5459 Other low back pain: Secondary | ICD-10-CM | POA: Insufficient documentation

## 2021-12-24 NOTE — Therapy (Signed)
OUTPATIENT PHYSICAL THERAPY THORACOLUMBAR EVALUATION   Patient Name: Johnathan Arnold. MRN: [id number] DOB:1946-05-21, 76 y.o., male Today's Date: 12/25/2021   PT End of Session - 12/25/21 0948     Visit Number 1    Number of Visits 16    Date for PT Re-Evaluation 02/18/22    Authorization Type Medicare    PT Start Time 1330    PT Stop Time 4010    PT Time Calculation (min) 45 min    Activity Tolerance Patient tolerated treatment well    Behavior During Therapy [facility] for tasks assessed/performed             Past Medical History:  Diagnosis Date   3-vessel coronary artery disease    s/p  5 vessel CABG   Diabetes mellitus without complication ([place])    History of cardiac catheterization 2011   [facility]   Hyperlipidemia    Hypertension    Hypertriglyceridemia    Parkinson's disease ([place])    Pneumonia 12/28/2020   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 LIMA to LAD, SVG to D1, OM1 and RPDA   CORONARY ARTERY BYPASS GRAFT  03/1998   5 vessel, [facility]   RIGHT HEART CATH N/A 04/04/2019   Procedure: RIGHT HEART CATH;  Surgeon: Wellington Hampshire, MD;  Location: [place] [facility];  Service: Cardiovascular;  Laterality: N/A;   RIGHT/LEFT HEART CATH AND CORONARY ANGIOGRAPHY N/A 02/01/2018   Procedure: RIGHT/LEFT HEART CATH AND CORONARY ANGIOGRAPHY;  Surgeon: Wellington Hampshire, MD;  Location: [place] [facility];  Service: Cardiovascular;  Laterality: N/A;   Patient Active Problem List   Diagnosis Date Noted   Hospital discharge follow-up 05/16/2021   Generalized weakness 05/07/2021   Fall    Head injury    Bradycardia    Anemia, unspecified 02/07/2021   Neutropenia ([place]) 01/29/2021   Thrombocytopenia ([place]) 01/29/2021   Hyponatremia 12/22/2020   Bilateral leg weakness 12/20/2020   Prostate cancer screening 09/08/2020   Mild neurocognitive disorder due to  Parkinson's disease ([place]) 09/06/2020   Low back pain of over 3 months duration 08/19/2019   Pulmonary hypertension ([place])    Insomnia 12/21/2018   Sleep apnea in adult 12/09/2018   Periodic limb movement disorder 12/09/2018   Pulmonary nodules 05/18/2018   Wears hearing aid in both ears 05/17/2018   Leg pain, bilateral 02/20/2018   Dyspnea    CKD (chronic kidney disease) stage 3, GFR 30-59 ml/min ([place]) 09/21/2017   History of skin cancer in adulthood 08/14/2016   Parkinson's disease ([place]) 08/09/2016   Bilateral carotid artery stenosis 04/02/2015   Vertigo, peripheral 10/17/2014   Benign prostatic hypertrophy with urinary frequency 01/31/2014   Encounter for Medicare annual wellness exam 07/02/2013   Obesity 04/03/2013   Other malaise and fatigue 09/21/2012   Hyperlipidemia    Hypertension    3-vessel coronary artery disease    S/P CABG x 5     PCP: Dr. Derrel Nip   REFERRING PROVIDER: Dr. Merla Riches DIAG: R29.898 (ICD-10-CM) - Bilateral leg weakness M54.59 (ICD-10-CM) - Other low back pain  Rationale for Evaluation and Treatment Rehabilitation  THERAPY DIAG:  Other low back pain  Weakness of both hips  ONSET DATE: 12/25/19  SUBJECTIVE:  SUBJECTIVE STATEMENT: Patient reports that his legs feel weak when standing up and it makes it hard to do anything standing up. He was seeing a chiropractor 8 months to a year ago and it was not helping so he quit going. He believes that the low back pain stems from a fall he sustained years ago off of a ladder.  PERTINENT HISTORY:  Per Dr. Lupita Dawn note on 09/05/21   He feels generally well, is exercising several times per week but limited by back pain, which is chronic  , and parkinson's disease.  No lonver checking blood sugars since he no longer meets  criteria for DM based on recurrent a1cs  < 5.8 on diet alone .    HTN:     Taking losartan  25 mg and amlodipine  2.5 mg  daily as directed. Walking regularly,  has been advised to be aware of orthostatic symptoms and need to moidfy medications in the future. Reviewed the pathophysiology of PD orthostass and the effect of medications on these symptoms    He reports that he has been having lower back pain for years.  It is non radiating,  it does not include spinal tenderness.  His back pain is aggravated by prolonged standing and by any activities. Pain  is left of center at L2 level.   Had PT at  their outpatient [address] location  in the past with good results,  but has not been doing the home exercises .     PD:  has good days and bad days energy level .  Affected by periods of low blood pressure reviewed his medication regimen and schedule    PAIN:  Are you having pain? Yes: NPRS scale: 6/10 Pain location: Left sided low back pain  Pain description: Achy  Aggravating factors: Standing  Relieving factors: Sitting    PRECAUTIONS: None  WEIGHT BEARING RESTRICTIONS No  FALLS:  Has patient fallen in last 6 months? No  LIVING ENVIRONMENT: Lives with: lives with their spouse Lives in: House/apartment Stairs: Yes: Internal: 16 steps; on right going up and External: 1 steps; none Has following equipment at home: None  OCCUPATION: Retired   PLOF: Independent  PATIENT GOALS Reduce low back pain and strengthen his legs    OBJECTIVE:               VITALS: BP 122/49 HR 58 SpO2 100  DIAGNOSTIC FINDINGS:  CLINICAL DATA:  Low back pain, left leg and buttock pain   EXAM: MRI LUMBAR SPINE WITHOUT CONTRAST   TECHNIQUE: Multiplanar, multisequence MR imaging of the lumbar spine was performed. No intravenous contrast was administered.   COMPARISON:  None.   FINDINGS: Motion artifact is present.   Segmentation: Standard.   Alignment:  Trace retrolisthesis at L1-L2, L2-L3,  and L3-L4.   Vertebrae: There is chronic compression deformity of L1 with less than 50% loss of height at superior and inferior endplates. Mild posterior osseous retropulsion. Additional degenerative endplate irregularity at several levels. There is no substantial marrow edema. No suspicious osseous lesion.   Conus medullaris and cauda equina: Conus extends to the L1 level. Conus and cauda equina appear normal.   Paraspinal and other soft tissues: Unremarkable.   Disc levels:   T12-L1: Minimal disc bulge. Mild posterior retropulsion of L1. No significant canal or foraminal stenosis.   L1-L2: Disc bulge.  No significant canal or foraminal stenosis.   L2-L3:  Disc bulge.  No significant canal or foraminal stenosis.   L3-L4:  Disc bulge. Facet arthropathy with ligamentum flavum infolding. Minor canal stenosis. No significant right foraminal stenosis. Minor left foraminal stenosis.   L4-L5: Disc bulge. Facet arthropathy with ligamentum flavum infolding. Minor canal stenosis. Slight effacement of the left greater than right subarticular recesses. Minor right greater than left foraminal stenosis.   L5-S1: Very small central disc protrusion. No significant canal or foraminal stenosis.   IMPRESSION: Multilevel degenerative changes as detailed above without high-grade stenosis.     Electronically Signed   By: Macy Mis M.D.   On: 11/15/2020 15:07  PATIENT SURVEYS:  FOTO 52/100  SCREENING FOR RED FLAGS: Bowel or bladder incontinence: No Spinal tumors: No Cauda equina syndrome: No Compression fracture: No Abdominal aneurysm: No  COGNITION:  Overall cognitive status: Within functional limits for tasks assessed     SENSATION: WFL  MUSCLE LENGTH: Hamstrings: Right 60 deg; Left 60 deg Thomas test: Negative bilateral   POSTURE: rounded shoulders and forward head  PALPATION: Left glute and L2 central spinous process   LUMBAR ROM:   Active  A/PROM  eval   Flexion 80%  Extension 100%  Right lateral flexion 100%  Left lateral flexion 100%  Right rotation 100%  Left rotation 100%   (Blank rows = not tested)  LOWER EXTREMITY ROM:       Active  Right 12/25/2021 Left 12/25/2021  Hip flexion 120 120  Hip extension 30 30  Hip abduction 45 45  Hip adduction 30 30  Hip internal rotation 45 45  Hip external rotation 45 45  Knee flexion 135 135  Knee extension 0 0  Ankle dorsiflexion 20 20  Ankle plantarflexion 50 50  Ankle inversion 35 35  Ankle eversion 15 15   (Blank rows = not tested)     LOWER EXTREMITY MMT:    MMT Right eval Left eval  Hip flexion 5 5  Hip extension 4 4  Hip abduction 4- 4-  Hip adduction 4- 4-  Hip internal rotation 5 5  Hip external rotation 5 5  Knee flexion 4+ 4+  Knee extension 5 5  Ankle dorsiflexion 5 5  Ankle plantarflexion 5 5  Ankle inversion    Ankle eversion     (Blank rows = not tested)  LUMBAR SPECIAL TESTS:  FABER test: Negative and FADIR test: Negative   FUNCTIONAL TESTS:  5 times sit to stand: 11.5 sec  30 seconds chair stand test 13 reps -<11 reps for males 75-79 are at increased risk for falls  GAIT: Distance walked: 40 FT  Assistive device utilized: None Level of assistance: Complete Independence Comments: FHRSP/kyphotic posture and decreased stride length     TODAY'S TREATMENT  Initial on 12/24/21  Lower Trunk Rotations 1 x 10  Butterfly with forward flexion 3 x 60 sec  Seated HS Stretch 2 x 60 sec    PATIENT EDUCATION:  Education details: form and technique and explanation of functional outcome measures  Person educated: Patient Education method: Explanation, Handout Education comprehension: verbalized understanding, returned demonstration, and verbal cues required   HOME EXERCISE PROGRAM: Access Code: [email or web address] URL: [email or web address] Date: 12/24/2021 Prepared by: Bradly Chris  Exercises - Supine Lower Trunk Rotation  - 1 x daily  - 7 x weekly - 3 sets - 10 reps - 3 hold - Seated Table Hamstring Stretch  - 1 x daily - 7 x weekly - 1 sets - 3 reps - 60 hold - Bound Angle Hands Forward   - 1 x daily - 7 x weekly -  1 sets - 3 reps - 60 hold  ASSESSMENT:  CLINICAL IMPRESSION: Patient is a 76 y.o. white male who was seen today for physical therapy evaluation and treatment for generalized LE weakness and low back pain. He exhibits signs and symptoms that make him appropriate for classification in the movement control treatment group with moderate to low pain, stable symptoms, and moderate disability. Static balance and LE strength deficits as causes for potential falls ruled out with 4 stage screen with patient able to achieve tandem stance. His deficits include decreased hip strength and flexibility, chronic low back pain with activity that place him at an increased risk for falls. He will benefit from PT to address the aforementioned deficits to return to completing standing activities like walking and self-care tasks without needing rest breaks and without feeling severe pain.     OBJECTIVE IMPAIRMENTS Abnormal gait, decreased endurance, decreased mobility, difficulty walking, decreased ROM, decreased strength, impaired flexibility, impaired sensation, and pain.   ACTIVITY LIMITATIONS carrying, lifting, bending, standing, squatting, sleeping, and locomotion level  PARTICIPATION LIMITATIONS: shopping, community activity, yard work, and school  PERSONAL FACTORS Age, Past/current experiences, Time since onset of injury/illness/exacerbation, and 3+ comorbidities: h/o PD, T2DM, and HTN   are also affecting patient's functional outcome.   REHAB POTENTIAL: Good  CLINICAL DECISION MAKING: Evolving/moderate complexity  EVALUATION COMPLEXITY: Moderate   GOALS: Goals reviewed with patient? No  SHORT TERM GOALS: Target date: 01/08/2022  Pt will be independent with HEP in order to improve strength and balance in order to decrease  fall risk and improve function at home and work. Baseline: NT  Goal status: INITIAL  2.  Patient will achieve full lumbar flexion as evidence of improved hamstring flexibility and improved spinal mobility to be able to bend down and pickup objects in his environment for self-care tasks  Baseline: 80% Flexion  Goal status: INITIAL  3.  Pt will increase 6MWT by at least 75m(1611f in order to demonstrate clinically significant improvement in cardiopulmonary endurance and community ambulation Baseline: NT  Goal status: INITIAL     LONG TERM GOALS: Target date: 02/19/2022  Patient will have improved function and activity level as evidenced by an increase in FOTO score by 10 points or more.  Baseline: 52/100 Target 60   Goal status: INITIAL  2.  Patient will improve hip strength by 1/2 grade MMT to offload surrounding structures of spine in order to improve symptoms.  Baseline: Hip Ext R/L 4/4, Hip Abd R/L 4-/4-, Hip Add R/L 4-/4- Goal status: INITIAL  3.  Patient will improve aerobic endurance on 64m41mto 1,000 ft in order to show community ambulator status and improved low back pain to tolerate upright activity.   Baseline: NT   Goal status: INITIAL  4. Patient will demonstrate improved core strength as evidenced by maintaining 40 mmHg pressure during stabilizer biofeedback measurement of TA in supine and with leg loading.  Baseline: Not performed  Goal status: INITIAL   PLAN: PT FREQUENCY: 1-2x/week  PT DURATION: 8 weeks  PLANNED INTERVENTIONS: Therapeutic exercises, Therapeutic activity, Gait training, Patient/Family education, Self Care, Joint mobilization, Joint manipulation, Stair training, Vestibular training, Canalith repositioning, Aquatic Therapy, Dry Needling, Electrical stimulation, Spinal manipulation, Spinal mobilization, Cryotherapy, Moist heat, Traction, Manual therapy, and Re-evaluation.  PLAN FOR NEXT SESSION: 64mW73mprogress hip strengthening and stretching  exercises, access abdominal strength with BP cuff.   DaniBradly Chris DPT  12/25/2021, 9:50 AM

## 2021-12-26 ENCOUNTER — Ambulatory Visit: Payer: Medicare Other | Admitting: Physical Therapy

## 2021-12-26 ENCOUNTER — Encounter: Payer: Self-pay | Admitting: Physical Therapy

## 2021-12-26 DIAGNOSIS — R269 Unspecified abnormalities of gait and mobility: Secondary | ICD-10-CM

## 2021-12-26 DIAGNOSIS — M5459 Other low back pain: Secondary | ICD-10-CM | POA: Diagnosis not present

## 2021-12-26 DIAGNOSIS — R262 Difficulty in walking, not elsewhere classified: Secondary | ICD-10-CM | POA: Diagnosis not present

## 2021-12-26 DIAGNOSIS — R29898 Other symptoms and signs involving the musculoskeletal system: Secondary | ICD-10-CM | POA: Diagnosis not present

## 2021-12-26 DIAGNOSIS — G20 Parkinson's disease: Secondary | ICD-10-CM | POA: Diagnosis not present

## 2021-12-26 NOTE — Therapy (Signed)
OUTPATIENT PHYSICAL THERAPY TREATMENT NOTE   Patient Name: Johnathan Arnold. MRN: [id number] DOB:1945/06/18, 76 y.o., male Today's Date: 12/26/2021  PCP: Dr. Derrel Nip  REFERRING PROVIDER: Dr. Derrel Nip   END OF SESSION:   PT End of Session - 12/26/21 0945     Visit Number 2    Number of Visits 16    Date for PT Re-Evaluation 02/18/22    Authorization Type Medicare    PT Start Time 0850    PT Stop Time 0930    PT Time Calculation (min) 40 min    Activity Tolerance Patient tolerated treatment well    Behavior During Therapy [facility] for tasks assessed/performed             Past Medical History:  Diagnosis Date   3-vessel coronary artery disease    s/p  5 vessel CABG   Diabetes mellitus without complication ([place])    History of cardiac catheterization 2011   [facility]   Hyperlipidemia    Hypertension    Hypertriglyceridemia    Parkinson's disease ([place])    Pneumonia 12/28/2020   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 LIMA to LAD, SVG to D1, OM1 and RPDA   CORONARY ARTERY BYPASS GRAFT  03/1998   5 vessel, [facility]   RIGHT HEART CATH N/A 04/04/2019   Procedure: RIGHT HEART CATH;  Surgeon: Wellington Hampshire, MD;  Location: [place] [facility];  Service: Cardiovascular;  Laterality: N/A;   RIGHT/LEFT HEART CATH AND CORONARY ANGIOGRAPHY N/A 02/01/2018   Procedure: RIGHT/LEFT HEART CATH AND CORONARY ANGIOGRAPHY;  Surgeon: Wellington Hampshire, MD;  Location: [place] [facility];  Service: Cardiovascular;  Laterality: N/A;   Patient Active Problem List   Diagnosis Date Noted   Hospital discharge follow-up 05/16/2021   Generalized weakness 05/07/2021   Fall    Head injury    Bradycardia    Anemia, unspecified 02/07/2021   Neutropenia ([place]) 01/29/2021   Thrombocytopenia ([place]) 01/29/2021   Hyponatremia 12/22/2020   Bilateral leg weakness 12/20/2020   Prostate cancer screening  09/08/2020   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[place]) 09/06/2020   Low back pain of over 3 months duration 08/19/2019   Pulmonary hypertension ([place])    Insomnia 12/21/2018   Sleep apnea in adult 12/09/2018   Periodic limb movement disorder 12/09/2018   Pulmonary nodules 05/18/2018   Wears hearing aid in both ears 05/17/2018   Leg pain, bilateral 02/20/2018   Dyspnea    CKD (chronic kidney disease) stage 3, GFR 30-59 ml/min ([place]) 09/21/2017   History of skin cancer in adulthood 08/14/2016   Parkinson's disease ([place]) 08/09/2016   Bilateral carotid artery stenosis 04/02/2015   Vertigo, peripheral 10/17/2014   Benign prostatic hypertrophy with urinary frequency 01/31/2014   Encounter for Medicare annual wellness exam 07/02/2013   Obesity 04/03/2013   Other malaise and fatigue 09/21/2012   Hyperlipidemia    Hypertension    3-vessel coronary artery disease    S/P CABG x 5     REFERRING DIAG: Chronic low back pain   THERAPY DIAG:  Other low back pain  Difficulty in walking, not elsewhere classified  Abnormality of gait and mobility  Rationale for Evaluation and Treatment Rehabilitation  PERTINENT HISTORY: Per Dr. Lupita Dawn note on 09/05/21   He feels generally well, is exercising several times per week but limited by back  pain, which is chronic  , and parkinson's disease.  No lonver checking blood sugars since he no longer meets criteria for DM based on recurrent a1cs  < 5.8 on diet alone .    HTN:     Taking losartan  25 mg and amlodipine  2.5 mg  daily as directed. Walking regularly,  has been advised to be aware of orthostatic symptoms and need to moidfy medications in the future. Reviewed the pathophysiology of PD orthostass and the effect of medications on these symptoms    He reports that he has been having lower back pain for years.  It is non radiating,  it does not include spinal tenderness.  His back pain is aggravated by prolonged standing and by any  activities. Pain  is left of center at L2 level.   Had PT at  their outpatient [address] location  in the past with good results,  but has not been doing the home exercises .     PD:  has good days and bad days energy level .  Affected by periods of low blood pressure reviewed his medication regimen and schedule   PRECAUTIONS: None   SUBJECTIVE: Patient has reports having a bad day with his Parkinson's disease and he was mostly sitting around the house yesterday.   PAIN:  Are you having pain? No   OBJECTIVE:   VITALS: BP 122/49 HR 58 SpO2 100   DIAGNOSTIC FINDINGS:  CLINICAL DATA:  Low back pain, left leg and buttock pain   EXAM: MRI LUMBAR SPINE WITHOUT CONTRAST   TECHNIQUE: Multiplanar, multisequence MR imaging of the lumbar spine was performed. No intravenous contrast was administered.   COMPARISON:  None.   FINDINGS: Motion artifact is present.   Segmentation: Standard.   Alignment:  Trace retrolisthesis at L1-L2, L2-L3, and L3-L4.   Vertebrae: There is chronic compression deformity of L1 with less than 50% loss of height at superior and inferior endplates. Mild posterior osseous retropulsion. Additional degenerative endplate irregularity at several levels. There is no substantial marrow edema. No suspicious osseous lesion.   Conus medullaris and cauda equina: Conus extends to the L1 level. Conus and cauda equina appear normal.   Paraspinal and other soft tissues: Unremarkable.   Disc levels:   T12-L1: Minimal disc bulge. Mild posterior retropulsion of L1. No significant canal or foraminal stenosis.   L1-L2: Disc bulge.  No significant canal or foraminal stenosis.   L2-L3:  Disc bulge.  No significant canal or foraminal stenosis.   L3-L4: Disc bulge. Facet arthropathy with ligamentum flavum infolding. Minor canal stenosis. No significant right foraminal stenosis. Minor left foraminal stenosis.   L4-L5: Disc bulge. Facet arthropathy with ligamentum  flavum infolding. Minor canal stenosis. Slight effacement of the left greater than right subarticular recesses. Minor right greater than left foraminal stenosis.   L5-S1: Very small central disc protrusion. No significant canal or foraminal stenosis.   IMPRESSION: Multilevel degenerative changes as detailed above without high-grade stenosis.     Electronically Signed   By: Macy Mis M.D.   On: 11/15/2020 15:07   PATIENT SURVEYS:  FOTO 52/100   SCREENING FOR RED FLAGS: Bowel or bladder incontinence: No Spinal tumors: No Cauda equina syndrome: No Compression fracture: No Abdominal aneurysm: No   COGNITION:           Overall cognitive status: Within functional limits for tasks assessed  SENSATION: WFL   MUSCLE LENGTH: Hamstrings: Right 60 deg; Left 60 deg Thomas test: Negative bilateral    POSTURE: rounded shoulders and forward head   PALPATION: Left glute and L2 central spinous process    LUMBAR ROM:    Active  A/PROM  eval  Flexion 80%  Extension 100%  Right lateral flexion 100%  Left lateral flexion 100%  Right rotation 100%  Left rotation 100%   (Blank rows = not tested)   LOWER EXTREMITY ROM:          Active  Right 12/25/2021 Left 12/25/2021  Hip flexion 120 120  Hip extension 30 30  Hip abduction 45 45  Hip adduction 30 30  Hip internal rotation 45 45  Hip external rotation 45 45  Knee flexion 135 135  Knee extension 0 0  Ankle dorsiflexion 20 20  Ankle plantarflexion 50 50  Ankle inversion 35 35  Ankle eversion 15 15   (Blank rows = not tested)        LOWER EXTREMITY MMT:     MMT Right eval Left eval  Hip flexion 5 5  Hip extension 4 4  Hip abduction 4- 4-  Hip adduction 4- 4-  Hip internal rotation 5 5  Hip external rotation 5 5  Knee flexion 4+ 4+  Knee extension 5 5  Ankle dorsiflexion 5 5  Ankle plantarflexion 5 5  Ankle inversion      Ankle eversion       (Blank rows = not tested)    LUMBAR SPECIAL TESTS:  FABER test: Negative and FADIR test: Negative    FUNCTIONAL TESTS:  5 times sit to stand: 11.5 sec  30 seconds chair stand test 13 reps -<11 reps for males 75-79 are at increased risk for falls TA Activation with BP Test: Average about 60 mmHg    GAIT: Distance walked: 40 FT  Assistive device utilized: None Level of assistance: Complete Independence Comments: FHRSP/kyphotic posture and decreased stride length        TODAY'S TREATMENT  12/26/21 3 min on Nu-Step with Level 2 resistance level 7 for arms and pedals  70mT: 1,200 feet NPS 1/10 low back pain   TA Activation with  Supine Double Bent Leg Lifts 1 x 10  Supine Double Bent Leg Lifts with leg extension 2 x 10  -min VC for leg extension hold  Prone Push Ups on Elbow 1 x 10 with 5 sec hold  Modified Thomas Stretch 4 x 60 sec with addition of strap  -Pt provided overpressure for portion of exercise   Supine Bridges 1 x 10  Supine Bridges 2 x 10 from 8 inch platform     Initial on 12/24/21  Lower Trunk Rotations 1 x 10  Butterfly with forward flexion 3 x 60 sec  Seated HS Stretch 2 x 60 sec      PATIENT EDUCATION:  Education details: form and technique and explanation of functional outcome measures  Person educated: Patient Education method: Explanation, Handout Education comprehension: verbalized understanding, returned demonstration, and verbal cues required     HOME EXERCISE PROGRAM: Access Code: [email or web address]: [email or web address] Date: 12/26/2021 Prepared by: DBradly Chris Exercises - Supine Lower Trunk Rotation  - 1 x daily - 7 x weekly - 3 sets - 10 reps - 3 hold - Seated Table Hamstring Stretch  - 1 x daily - 7 x weekly - 1 sets - 3 reps - 60 hold - Bound Angle Hands Forward   -  1 x daily - 7 x weekly - 1 sets - 3 reps - 60 hold - Supine Bent Leg Lift with Knee Extension  - 1 x daily - 3 x weekly - 3 sets - 5 reps - 1 hold - Supine Bridge  - 1 x daily - 3 x  weekly - 3 sets - 10 reps - Prone Scapular Protraction Retraction AROM on Forearms  - 1 x daily - 3 x weekly - 3 sets - 10 reps - 5 hold   ASSESSMENT:   CLINICAL IMPRESSION: Pt exhibits aerobic endurance of healthy adult and community ambulator status as evidenced by performance on 33mT. He does show decreased abdominal strength and increased hip flexor and abdominal tension because of postural dysfunction from PD. Exercises modified to stretch muscular tightness and target hip and abdominal weakness without an increase in pt's pain. He will benefit from PT to address the aforementioned deficits to return to completing standing activities like walking and self-care tasks without needing rest breaks and without feeling severe pain.       OBJECTIVE IMPAIRMENTS Abnormal gait, decreased endurance, decreased mobility, difficulty walking, decreased ROM, decreased strength, impaired flexibility, impaired sensation, and pain.    ACTIVITY LIMITATIONS carrying, lifting, bending, standing, squatting, sleeping, and locomotion level   PARTICIPATION LIMITATIONS: shopping, community activity, yard work, and school   PERSONAL FACTORS Age, Past/current experiences, Time since onset of injury/illness/exacerbation, and 3+ comorbidities: h/o PD, T2DM, and HTN   are also affecting patient's functional outcome.    REHAB POTENTIAL: Good   CLINICAL DECISION MAKING: Evolving/moderate complexity   EVALUATION COMPLEXITY: Moderate     GOALS: Goals reviewed with patient? No   SHORT TERM GOALS: Target date: 01/08/2022   Pt will be independent with HEP in order to improve strength and balance in order to decrease fall risk and improve function at home and work. Baseline: NT  Goal status: Ongoing    2.  Patient will achieve full lumbar flexion as evidence of improved hamstring flexibility and improved spinal mobility to be able to bend down and pickup objects in his environment for self-care tasks  Baseline: 80%  Flexion  Goal status: Ongoing    3.  Pt will increase 6MWT by at least [id number] order to demonstrate clinically significant improvement in cardiopulmonary endurance and community ambulation Baseline: NT  Goal status: Ongoing          LONG TERM GOALS: Target date: 02/19/2022   Patient will have improved function and activity level as evidenced by an increase in FOTO score by 10 points or more.  Baseline: 52/100 Target 60   Goal status: INITIAL   2.  Patient will improve hip strength by 1/2 grade MMT to offload surrounding structures of spine in order to improve symptoms.  Baseline: Hip Ext R/L 4/4, Hip Abd R/L 4-/4-, Hip Add R/L 4-/4- Goal status: INITIAL   3.  Patient will improve aerobic endurance on 6mW5mo 1,000 ft in order to show community ambulator status and improved low back pain to tolerate upright activity.   Baseline: NT   Goal status: INITIAL   4. Patient will demonstrate improved core strength as evidenced by maintaining 40 mmHg pressure during stabilizer biofeedback measurement of TA in supine and with leg loading.  Baseline: Not performed  Goal status: INITIAL     PLAN: PT FREQUENCY: 1-2x/week   PT DURATION: 8 weeks   PLANNED INTERVENTIONS: Therapeutic exercises, Therapeutic activity, Gait training, Patient/Family education, Self Care, Joint mobilization, Joint  manipulation, Stair training, Vestibular training, Canalith repositioning, Aquatic Therapy, Dry Needling, Electrical stimulation, Spinal manipulation, Spinal mobilization, Cryotherapy, Moist heat, Traction, Manual therapy, and Re-evaluation.   PLAN FOR NEXT SESSION: Progress hip strengthening and stretching exercises, rotational and postural exercises     Bradly Chris PT, DPT  12/26/2021, 9:46 AM

## 2021-12-30 ENCOUNTER — Ambulatory Visit: Payer: Medicare Other | Admitting: Physical Therapy

## 2021-12-30 DIAGNOSIS — H2512 Age-related nuclear cataract, left eye: Secondary | ICD-10-CM | POA: Diagnosis not present

## 2021-12-30 DIAGNOSIS — H269 Unspecified cataract: Secondary | ICD-10-CM | POA: Diagnosis not present

## 2021-12-31 ENCOUNTER — Encounter: Payer: Medicare Other | Admitting: Physical Therapy

## 2022-01-01 ENCOUNTER — Ambulatory Visit: Payer: Medicare Other | Admitting: Physical Therapy

## 2022-01-01 ENCOUNTER — Encounter: Payer: Medicare Other | Admitting: Physical Therapy

## 2022-01-01 ENCOUNTER — Encounter: Payer: Self-pay | Admitting: Physical Therapy

## 2022-01-01 DIAGNOSIS — R29898 Other symptoms and signs involving the musculoskeletal system: Secondary | ICD-10-CM | POA: Diagnosis not present

## 2022-01-01 DIAGNOSIS — R262 Difficulty in walking, not elsewhere classified: Secondary | ICD-10-CM

## 2022-01-01 DIAGNOSIS — M5459 Other low back pain: Secondary | ICD-10-CM

## 2022-01-01 DIAGNOSIS — R269 Unspecified abnormalities of gait and mobility: Secondary | ICD-10-CM

## 2022-01-01 DIAGNOSIS — G20 Parkinson's disease: Secondary | ICD-10-CM | POA: Diagnosis not present

## 2022-01-01 NOTE — Therapy (Signed)
OUTPATIENT PHYSICAL THERAPY TREATMENT NOTE   Patient Name: Johnathan Arnold. MRN: [id number] DOB:10-05-1945, 76 y.o., male Today's Date: 01/01/2022  PCP: Dr. Derrel Nip  REFERRING PROVIDER: Dr. Derrel Nip   END OF SESSION:   PT End of Session - 01/01/22 1333     Visit Number 3    Number of Visits 16    Date for PT Re-Evaluation 02/18/22    Authorization Type Medicare    PT Start Time 1330    PT Stop Time 2595    PT Time Calculation (min) 45 min    Activity Tolerance Patient tolerated treatment well    Behavior During Therapy [facility] for tasks assessed/performed             Past Medical History:  Diagnosis Date   3-vessel coronary artery disease    s/p  5 vessel CABG   Diabetes mellitus without complication ([place])    History of cardiac catheterization 2011   [facility]   Hyperlipidemia    Hypertension    Hypertriglyceridemia    Parkinson's disease ([place])    Pneumonia 12/28/2020   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 LIMA to LAD, SVG to D1, OM1 and RPDA   CORONARY ARTERY BYPASS GRAFT  03/1998   5 vessel, [facility]   RIGHT HEART CATH N/A 04/04/2019   Procedure: RIGHT HEART CATH;  Surgeon: Wellington Hampshire, MD;  Location: [place] [facility];  Service: Cardiovascular;  Laterality: N/A;   RIGHT/LEFT HEART CATH AND CORONARY ANGIOGRAPHY N/A 02/01/2018   Procedure: RIGHT/LEFT HEART CATH AND CORONARY ANGIOGRAPHY;  Surgeon: Wellington Hampshire, MD;  Location: [place] [facility];  Service: Cardiovascular;  Laterality: N/A;   Patient Active Problem List   Diagnosis Date Noted   Hospital discharge follow-up 05/16/2021   Generalized weakness 05/07/2021   Fall    Head injury    Bradycardia    Anemia, unspecified 02/07/2021   Neutropenia ([place]) 01/29/2021   Thrombocytopenia ([place]) 01/29/2021   Hyponatremia 12/22/2020   Bilateral leg weakness 12/20/2020   Prostate cancer screening  09/08/2020   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[place]) 09/06/2020   Low back pain of over 3 months duration 08/19/2019   Pulmonary hypertension ([place])    Insomnia 12/21/2018   Sleep apnea in adult 12/09/2018   Periodic limb movement disorder 12/09/2018   Pulmonary nodules 05/18/2018   Wears hearing aid in both ears 05/17/2018   Leg pain, bilateral 02/20/2018   Dyspnea    CKD (chronic kidney disease) stage 3, GFR 30-59 ml/min ([place]) 09/21/2017   History of skin cancer in adulthood 08/14/2016   Parkinson's disease ([place]) 08/09/2016   Bilateral carotid artery stenosis 04/02/2015   Vertigo, peripheral 10/17/2014   Benign prostatic hypertrophy with urinary frequency 01/31/2014   Encounter for Medicare annual wellness exam 07/02/2013   Obesity 04/03/2013   Other malaise and fatigue 09/21/2012   Hyperlipidemia    Hypertension    3-vessel coronary artery disease    S/P CABG x 5     REFERRING DIAG: Chronic low back pain   THERAPY DIAG:  Other low back pain  Difficulty in walking, not elsewhere classified  Abnormality of gait and mobility  Rationale for Evaluation and Treatment Rehabilitation  PERTINENT HISTORY: Per Dr. Lupita Dawn note on 09/05/21   He feels generally well, is exercising several times per week but limited by back  pain, which is chronic  , and parkinson's disease.  No lonver checking blood sugars since he no longer meets criteria for DM based on recurrent a1cs  < 5.8 on diet alone .    HTN:     Taking losartan  25 mg and amlodipine  2.5 mg  daily as directed. Walking regularly,  has been advised to be aware of orthostatic symptoms and need to moidfy medications in the future. Reviewed the pathophysiology of PD orthostass and the effect of medications on these symptoms    He reports that he has been having lower back pain for years.  It is non radiating,  it does not include spinal tenderness.  His back pain is aggravated by prolonged standing and by any  activities. Pain  is left of center at L2 level.   Had PT at  their outpatient [address] location  in the past with good results,  but has not been doing the home exercises .     PD:  has good days and bad days energy level .  Affected by periods of low blood pressure reviewed his medication regimen and schedule   PRECAUTIONS: None   SUBJECTIVE: Patient reports not feeling well this past week and he thinks it has something to do with his PD.   PAIN:  Are you having pain? No   OBJECTIVE:   VITALS: BP 122/49 HR 58 SpO2 100   DIAGNOSTIC FINDINGS:  CLINICAL DATA:  Low back pain, left leg and buttock pain   EXAM: MRI LUMBAR SPINE WITHOUT CONTRAST   TECHNIQUE: Multiplanar, multisequence MR imaging of the lumbar spine was performed. No intravenous contrast was administered.   COMPARISON:  None.   FINDINGS: Motion artifact is present.   Segmentation: Standard.   Alignment:  Trace retrolisthesis at L1-L2, L2-L3, and L3-L4.   Vertebrae: There is chronic compression deformity of L1 with less than 50% loss of height at superior and inferior endplates. Mild posterior osseous retropulsion. Additional degenerative endplate irregularity at several levels. There is no substantial marrow edema. No suspicious osseous lesion.   Conus medullaris and cauda equina: Conus extends to the L1 level. Conus and cauda equina appear normal.   Paraspinal and other soft tissues: Unremarkable.   Disc levels:   T12-L1: Minimal disc bulge. Mild posterior retropulsion of L1. No significant canal or foraminal stenosis.   L1-L2: Disc bulge.  No significant canal or foraminal stenosis.   L2-L3:  Disc bulge.  No significant canal or foraminal stenosis.   L3-L4: Disc bulge. Facet arthropathy with ligamentum flavum infolding. Minor canal stenosis. No significant right foraminal stenosis. Minor left foraminal stenosis.   L4-L5: Disc bulge. Facet arthropathy with ligamentum flavum infolding. Minor  canal stenosis. Slight effacement of the left greater than right subarticular recesses. Minor right greater than left foraminal stenosis.   L5-S1: Very small central disc protrusion. No significant canal or foraminal stenosis.   IMPRESSION: Multilevel degenerative changes as detailed above without high-grade stenosis.     Electronically Signed   By: Macy Mis M.D.   On: 11/15/2020 15:07   PATIENT SURVEYS:  FOTO 52/100   SCREENING FOR RED FLAGS: Bowel or bladder incontinence: No Spinal tumors: No Cauda equina syndrome: No Compression fracture: No Abdominal aneurysm: No   COGNITION:           Overall cognitive status: Within functional limits for tasks assessed  SENSATION: WFL   MUSCLE LENGTH: Hamstrings: Right 60 deg; Left 60 deg Thomas test: Negative bilateral    POSTURE: rounded shoulders and forward head   PALPATION: Left glute and L2 central spinous process    LUMBAR ROM:    Active  A/PROM  eval  Flexion 80%  Extension 100%  Right lateral flexion 100%  Left lateral flexion 100%  Right rotation 100%  Left rotation 100%   (Blank rows = not tested)   LOWER EXTREMITY ROM:          Active  Right 12/25/2021 Left 12/25/2021  Hip flexion 120 120  Hip extension 30 30  Hip abduction 45 45  Hip adduction 30 30  Hip internal rotation 45 45  Hip external rotation 45 45  Knee flexion 135 135  Knee extension 0 0  Ankle dorsiflexion 20 20  Ankle plantarflexion 50 50  Ankle inversion 35 35  Ankle eversion 15 15   (Blank rows = not tested)        LOWER EXTREMITY MMT:     MMT Right eval Left eval  Hip flexion 5 5  Hip extension 4 4  Hip abduction 4- 4-  Hip adduction 4- 4-  Hip internal rotation 5 5  Hip external rotation 5 5  Knee flexion 4+ 4+  Knee extension 5 5  Ankle dorsiflexion 5 5  Ankle plantarflexion 5 5  Ankle inversion      Ankle eversion       (Blank rows = not tested)   LUMBAR SPECIAL TESTS:   FABER test: Negative and FADIR test: Negative    FUNCTIONAL TESTS:  5 times sit to stand: 11.5 sec  30 seconds chair stand test 13 reps -<11 reps for males 75-79 are at increased risk for falls TA Activation with BP Test: Average about 60 mmHg    GAIT: Distance walked: 40 FT  Assistive device utilized: None Level of assistance: Complete Independence Comments: FHRSP/kyphotic posture and decreased stride length        TODAY'S TREATMENT  01/01/22  TM 1.5 mph 5 min  Lower Trunk Rotations 3 x 10  Thoracic Rotation 3 x 10  Mini-Squats 1 x 10  Mini-Squats 2 x 10 with jug of water  AAROM Lumbar Flexion Center, Right, and Left Silver Ball Rolls 3 x 10  Forward Flexion 3 x 30 sec  Seated Rows with Blue TB 3 x 10  Shoulder Horizontal Abduction at 90 degrees with red TB 3 x 10    12/26/21 3 min on Nu-Step with Level 2 resistance level 7 for arms and pedals  107mT: 1,200 feet NPS 1/10 low back pain   TA Activation with  Supine Double Bent Leg Lifts 1 x 10  Supine Double Bent Leg Lifts with leg extension 2 x 10  -min VC for leg extension hold  Prone Push Ups on Elbow 1 x 10 with 5 sec hold  Modified Thomas Stretch 4 x 60 sec with addition of strap  -Pt provided overpressure for portion of exercise   Supine Bridges 1 x 10  Supine Bridges 2 x 10 from 8 inch platform   Initial on 12/24/21  Lower Trunk Rotations 1 x 10  Butterfly with forward flexion 3 x 60 sec  Seated HS Stretch 2 x 60 sec      PATIENT EDUCATION:  Education details: form and technique and explanation of functional outcome measures  Person educated: Patient Education method: Explanation, Handout Education comprehension: verbalized understanding, returned demonstration,  and verbal cues required     HOME EXERCISE PROGRAM: Access Code: [email or web address] URL: [email or web address] Date: 01/01/2022 Prepared by: Bradly Chris  Exercises - Supine Lower Trunk Rotation  - 1 x daily - 7 x weekly - 3 sets - 10  reps - 3 hold - Seated Table Hamstring Stretch  - 1 x daily - 7 x weekly - 1 sets - 3 reps - 60 hold - Bound Angle Hands Forward   - 1 x daily - 7 x weekly - 1 sets - 3 reps - 60 hold - Supine Bent Leg Lift with Knee Extension  - 1 x daily - 3 x weekly - 3 sets - 5 reps - 1 hold - Prone Scapular Protraction Retraction AROM on Forearms  - 1 x daily - 3 x weekly - 3 sets - 10 reps - 5 hold - Sidelying Open Book Thoracic Lumbar Rotation and Extension  - 1 x daily - 7 x weekly - 3 sets - 10 reps - Mini Squat  - 1 x daily - 3 x weekly - 3 sets - 10 reps - Seated Shoulder Row with Anchored Resistance  - 1 x daily - 3 x weekly - 3 sets - 10 reps - Standing Shoulder Horizontal Abduction with Resistance  - 1 x daily - 3 x weekly - 3 sets - 10 reps   ASSESSMENT:   CLINICAL IMPRESSION:  Pt shows ability to perform all exercises without an increase in his low back pain. He was able to show an improvement in LE strength with ability to perform mini-squat while holding jug for increased resistance. He will benefit from PT to address the aforementioned deficits to return to completing standing activities like walking and self-care tasks without needing rest breaks and without feeling severe pain.      OBJECTIVE IMPAIRMENTS Abnormal gait, decreased endurance, decreased mobility, difficulty walking, decreased ROM, decreased strength, impaired flexibility, impaired sensation, and pain.    ACTIVITY LIMITATIONS carrying, lifting, bending, standing, squatting, sleeping, and locomotion level   PARTICIPATION LIMITATIONS: shopping, community activity, yard work, and school   PERSONAL FACTORS Age, Past/current experiences, Time since onset of injury/illness/exacerbation, and 3+ comorbidities: h/o PD, T2DM, and HTN   are also affecting patient's functional outcome.    REHAB POTENTIAL: Good   CLINICAL DECISION MAKING: Evolving/moderate complexity   EVALUATION COMPLEXITY: Moderate     GOALS: Goals reviewed with  patient? No   SHORT TERM GOALS: Target date: 01/08/2022   Pt will be independent with HEP in order to improve strength and balance in order to decrease fall risk and improve function at home and work. Baseline: NT  Goal status: Ongoing    2.  Patient will achieve full lumbar flexion as evidence of improved hamstring flexibility and improved spinal mobility to be able to bend down and pickup objects in his environment for self-care tasks  Baseline: 80% Flexion  Goal status: Ongoing    3.  Pt will increase 6MWT by at least 14m(1690f in order to demonstrate clinically significant improvement in cardiopulmonary endurance and community ambulation Baseline: 1,200 ft  Goal status: Ongoing          LONG TERM GOALS: Target date: 02/19/2022   Patient will have improved function and activity level as evidenced by an increase in FOTO score by 10 points or more.  Baseline: 52/100 Target 60   Goal status: INITIAL   2.  Patient will improve hip strength by 1/2 grade MMT to offload surrounding structures of  spine in order to improve symptoms.  Baseline: Hip Ext R/L 4/4, Hip Abd R/L 4-/4-, Hip Add R/L 4-/4- Goal status: INITIAL   3.  Patient will improve aerobic endurance on 38mT to 1,000 ft in order to show community ambulator status and improved low back pain to tolerate upright activity.   Baseline: 1,200 ft  Goal status: Deferred    4. Patient will demonstrate improved core strength as evidenced by maintaining 40 mmHg pressure during stabilizer biofeedback measurement of TA in supine and with leg loading.  Baseline: Not performed  Goal status: INITIAL     PLAN: PT FREQUENCY: 1-2x/week   PT DURATION: 8 weeks   PLANNED INTERVENTIONS: Therapeutic exercises, Therapeutic activity, Gait training, Patient/Family education, Self Care, Joint mobilization, Joint manipulation, Stair training, Vestibular training, Canalith repositioning, Aquatic Therapy, Dry Needling, Electrical stimulation, Spinal  manipulation, Spinal mobilization, Cryotherapy, Moist heat, Traction, Manual therapy, and Re-evaluation.   PLAN FOR NEXT SESSION: Progress parascapular and  hip strengthening and stretching exercises, rotational and postural exercises. Bird dogs     DBradly ChrisPT, DPT  01/01/2022, 1:34 PM

## 2022-01-03 ENCOUNTER — Other Ambulatory Visit: Payer: Self-pay | Admitting: Cardiovascular Disease

## 2022-01-06 ENCOUNTER — Encounter: Payer: Medicare Other | Admitting: Physical Therapy

## 2022-01-07 ENCOUNTER — Telehealth: Payer: Self-pay | Admitting: Physical Therapy

## 2022-01-07 NOTE — Telephone Encounter (Signed)
Called pt to inquiry about coming into earlier slots because of increased availability. Pt did not respond so VM instructing pt to call back if he wanted to come in earlier.

## 2022-01-08 ENCOUNTER — Ambulatory Visit: Payer: Medicare Other | Attending: Internal Medicine | Admitting: Physical Therapy

## 2022-01-08 ENCOUNTER — Other Ambulatory Visit: Payer: Self-pay | Admitting: Cardiovascular Disease

## 2022-01-08 DIAGNOSIS — M5459 Other low back pain: Secondary | ICD-10-CM | POA: Diagnosis not present

## 2022-01-08 DIAGNOSIS — R269 Unspecified abnormalities of gait and mobility: Secondary | ICD-10-CM | POA: Diagnosis not present

## 2022-01-08 DIAGNOSIS — R262 Difficulty in walking, not elsewhere classified: Secondary | ICD-10-CM | POA: Insufficient documentation

## 2022-01-08 NOTE — Therapy (Signed)
OUTPATIENT PHYSICAL THERAPY TREATMENT NOTE   Patient Name: Johnathan Arnold. MRN: [id number] DOB:[birth year], 76 y.o., male Today's Date: 01/08/2022  PCP: Dr. Derrel Nip  REFERRING PROVIDER: Dr. Derrel Nip   END OF SESSION:   PT End of Session - 01/08/22 1147     Visit Number 4    Number of Visits 16    Date for PT Re-Evaluation 02/18/22    Authorization Type Medicare    PT Start Time 1145    PT Stop Time 1230    PT Time Calculation (min) 45 min    Activity Tolerance Patient tolerated treatment well    Behavior During Therapy [facility] for tasks assessed/performed             Past Medical History:  Diagnosis Date   3-vessel coronary artery disease    s/p  5 vessel CABG   Diabetes mellitus without complication ([place])    History of cardiac catheterization 2011   [facility]   Hyperlipidemia    Hypertension    Hypertriglyceridemia    Parkinson's disease ([place])    Pneumonia 12/28/2020   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 LIMA to LAD, SVG to D1, OM1 and RPDA   CORONARY ARTERY BYPASS GRAFT  03/1998   5 vessel, [facility]   RIGHT HEART CATH N/A 04/04/2019   Procedure: RIGHT HEART CATH;  Surgeon: Wellington Hampshire, MD;  Location: [facility];  Service: Cardiovascular;  Laterality: N/A;   RIGHT/LEFT HEART CATH AND CORONARY ANGIOGRAPHY N/A 02/01/2018   Procedure: RIGHT/LEFT HEART CATH AND CORONARY ANGIOGRAPHY;  Surgeon: Wellington Hampshire, MD;  Location: [place] [facility];  Service: Cardiovascular;  Laterality: N/A;   Patient Active Problem List   Diagnosis Date Noted   Hospital discharge follow-up 05/16/2021   Generalized weakness 05/07/2021   Fall    Head injury    Bradycardia    Anemia, unspecified 02/07/2021   Neutropenia ([place]) 01/29/2021   Thrombocytopenia ([place]) 01/29/2021   Hyponatremia 12/22/2020   Bilateral leg weakness 12/20/2020   Prostate cancer screening  09/08/2020   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[place]) 09/06/2020   Low back pain of over 3 months duration 08/19/2019   Pulmonary hypertension ([place])    Insomnia 12/21/2018   Sleep apnea in adult 12/09/2018   Periodic limb movement disorder 12/09/2018   Pulmonary nodules 05/18/2018   Wears hearing aid in both ears 05/17/2018   Leg pain, bilateral 02/20/2018   Dyspnea    CKD (chronic kidney disease) stage 3, GFR 30-59 ml/min ([place]) 09/21/2017   History of skin cancer in adulthood 08/14/2016   Parkinson's disease ([place]) 08/09/2016   Bilateral carotid artery stenosis 04/02/2015   Vertigo, peripheral 10/17/2014   Benign prostatic hypertrophy with urinary frequency 01/31/2014   Encounter for Medicare annual wellness exam 07/02/2013   Obesity 04/03/2013   Other malaise and fatigue 09/21/2012   Hyperlipidemia    Hypertension    3-vessel coronary artery disease    S/P CABG x 5     REFERRING DIAG: Chronic low back pain   THERAPY DIAG:  Other low back pain  Difficulty in walking, not elsewhere classified  Rationale for Evaluation and Treatment Rehabilitation  PERTINENT HISTORY: Per Dr. Lupita Dawn note on 09/05/21   He feels generally well, is exercising several times per week but limited by back pain, which is chronic  ,  and parkinson's disease.  No lonver checking blood sugars since he no longer meets criteria for DM based on recurrent a1cs  < 5.8 on diet alone .    HTN:     Taking losartan  25 mg and amlodipine  2.5 mg  daily as directed. Walking regularly,  has been advised to be aware of orthostatic symptoms and need to moidfy medications in the future. Reviewed the pathophysiology of PD orthostass and the effect of medications on these symptoms    He reports that he has been having lower back pain for years.  It is non radiating,  it does not include spinal tenderness.  His back pain is aggravated by prolonged standing and by any activities. Pain  is left of center at  L2 level.   Had PT at  their outpatient [address] location  in the past with good results,  but has not been doing the home exercises .     PD:  has good days and bad days energy level .  Affected by periods of low blood pressure reviewed his medication regimen and schedule   PRECAUTIONS: None   SUBJECTIVE: Patient had a rough weekend with his PD and low blood pressure. He has not had increase in his back pain.   PAIN:  Are you having pain? No   OBJECTIVE:   VITALS: BP 122/49 HR 58 SpO2 100   DIAGNOSTIC FINDINGS:  CLINICAL DATA:  Low back pain, left leg and buttock pain   EXAM: MRI LUMBAR SPINE WITHOUT CONTRAST   TECHNIQUE: Multiplanar, multisequence MR imaging of the lumbar spine was performed. No intravenous contrast was administered.   COMPARISON:  None.   FINDINGS: Motion artifact is present.   Segmentation: Standard.   Alignment:  Trace retrolisthesis at L1-L2, L2-L3, and L3-L4.   Vertebrae: There is chronic compression deformity of L1 with less than 50% loss of height at superior and inferior endplates. Mild posterior osseous retropulsion. Additional degenerative endplate irregularity at several levels. There is no substantial marrow edema. No suspicious osseous lesion.   Conus medullaris and cauda equina: Conus extends to the L1 level. Conus and cauda equina appear normal.   Paraspinal and other soft tissues: Unremarkable.   Disc levels:   T12-L1: Minimal disc bulge. Mild posterior retropulsion of L1. No significant canal or foraminal stenosis.   L1-L2: Disc bulge.  No significant canal or foraminal stenosis.   L2-L3:  Disc bulge.  No significant canal or foraminal stenosis.   L3-L4: Disc bulge. Facet arthropathy with ligamentum flavum infolding. Minor canal stenosis. No significant right foraminal stenosis. Minor left foraminal stenosis.   L4-L5: Disc bulge. Facet arthropathy with ligamentum flavum infolding. Minor canal stenosis. Slight  effacement of the left greater than right subarticular recesses. Minor right greater than left foraminal stenosis.   L5-S1: Very small central disc protrusion. No significant canal or foraminal stenosis.   IMPRESSION: Multilevel degenerative changes as detailed above without high-grade stenosis.     Electronically Signed   By: Macy Mis M.D.   On: 11/15/2020 15:07   PATIENT SURVEYS:  FOTO 52/100   SCREENING FOR RED FLAGS: Bowel or bladder incontinence: No Spinal tumors: No Cauda equina syndrome: No Compression fracture: No Abdominal aneurysm: No   COGNITION:           Overall cognitive status: Within functional limits for tasks assessed  SENSATION: WFL   MUSCLE LENGTH: Hamstrings: Right 60 deg; Left 60 deg Thomas test: Negative bilateral    POSTURE: rounded shoulders and forward head   PALPATION: Left glute and L2 central spinous process    LUMBAR ROM:    Active  A/PROM  eval  Flexion 80%  Extension 100%  Right lateral flexion 100%  Left lateral flexion 100%  Right rotation 100%  Left rotation 100%   (Blank rows = not tested)   LOWER EXTREMITY ROM:          Active  Right 12/25/2021 Left 12/25/2021  Hip flexion 120 120  Hip extension 30 30  Hip abduction 45 45  Hip adduction 30 30  Hip internal rotation 45 45  Hip external rotation 45 45  Knee flexion 135 135  Knee extension 0 0  Ankle dorsiflexion 20 20  Ankle plantarflexion 50 50  Ankle inversion 35 35  Ankle eversion 15 15   (Blank rows = not tested)        LOWER EXTREMITY MMT:     MMT Right eval Left eval  Hip flexion 5 5  Hip extension 4 4  Hip abduction 4- 4-  Hip adduction 4- 4-  Hip internal rotation 5 5  Hip external rotation 5 5  Knee flexion 4+ 4+  Knee extension 5 5  Ankle dorsiflexion 5 5  Ankle plantarflexion 5 5  Ankle inversion      Ankle eversion       (Blank rows = not tested)   LUMBAR SPECIAL TESTS:  FABER test: Negative and  FADIR test: Negative    FUNCTIONAL TESTS:  5 times sit to stand: 11.5 sec  30 seconds chair stand test 13 reps -<11 reps for males 75-79 are at increased risk for falls TA Activation with BP Test: Average about 60 mmHg    GAIT: Distance walked: 40 FT  Assistive device utilized: None Level of assistance: Complete Independence Comments: FHRSP/kyphotic posture and decreased stride length        TODAY'S TREATMENT   01/08/22 Nu-Step seat at 7 and arms at 4 for 5 min   Omega Seated Rows #25 1 x 10  Omega Seated Rows #35 1 x 8  Omega Seated Rows #40 1 x 8   Bird Dogs 3 x 8  -min VC to maintain level back to avoid pelvic tilt   Oblique Bicycle Crunch 3 x 8  - min VC for sequence of exercise   Shoulder Horizontal Abduction at 90 deg 3 x 8 with Green band   Seated Rows with Black TB 3 x 10    01/01/22  TM 1.5 mph 5 min  Lower Trunk Rotations 3 x 10  Thoracic Rotation 3 x 10  Mini-Squats 1 x 10  Mini-Squats 2 x 10 with jug of water  AAROM Lumbar Flexion Center, Right, and Left Silver Ball Rolls 3 x 10  Forward Flexion 3 x 30 sec  Seated Rows with Blue TB 3 x 10  Shoulder Horizontal Abduction at 90 degrees with red TB 3 x 10    12/26/21 3 min on Nu-Step with Level 2 resistance level 7 for arms and pedals  81mT: 1,200 feet NPS 1/10 low back pain   TA Activation with  Supine Double Bent Leg Lifts 1 x 10  Supine Double Bent Leg Lifts with leg extension 2 x 10  -min VC for leg extension hold  Prone Push Ups on Elbow 1 x 10 with 5 sec hold  Modified Thomas Stretch 4 x 60 sec with addition of strap  -Pt provided overpressure for portion of exercise   Supine Bridges 1 x 10  Supine Bridges 2 x 10 from 8 inch platform   Initial on 12/24/21  Lower Trunk Rotations 1 x 10  Butterfly with forward flexion 3 x 60 sec  Seated HS Stretch 2 x 60 sec      PATIENT EDUCATION:  Education details: form and technique and explanation of functional outcome measures  Person educated:  Patient Education method: Explanation, Handout Education comprehension: verbalized understanding, returned demonstration, and verbal cues required     HOME EXERCISE PROGRAM: Access Code: [email or web address] URL: [email or web address] Date: 01/01/2022 Prepared by: Bradly Chris  Exercises - Supine Lower Trunk Rotation  - 1 x daily - 7 x weekly - 3 sets - 10 reps - 3 hold - Seated Table Hamstring Stretch  - 1 x daily - 7 x weekly - 1 sets - 3 reps - 60 hold - Bound Angle Hands Forward   - 1 x daily - 7 x weekly - 1 sets - 3 reps - 60 hold - Supine Bent Leg Lift with Knee Extension  - 1 x daily - 3 x weekly - 3 sets - 5 reps - 1 hold - Prone Scapular Protraction Retraction AROM on Forearms  - 1 x daily - 3 x weekly - 3 sets - 10 reps - 5 hold - Sidelying Open Book Thoracic Lumbar Rotation and Extension  - 1 x daily - 7 x weekly - 3 sets - 10 reps - Mini Squat  - 1 x daily - 3 x weekly - 3 sets - 10 reps - Seated Shoulder Row with Anchored Resistance  - 1 x daily - 3 x weekly - 3 sets - 10 reps - Standing Shoulder Horizontal Abduction with Resistance  - 1 x daily - 3 x weekly - 3 sets - 10 reps   ASSESSMENT:   CLINICAL IMPRESSION: Pt exhibits an increase in his parascapular and abdominal strength with ability to perform crunches and increased resistance with shoulder horizontal abduction and seated rows. He will benefit from PT to address the aforementioned deficits to return to completing standing activities like walking and self-care tasks without needing rest breaks and without feeling severe pain.        OBJECTIVE IMPAIRMENTS Abnormal gait, decreased endurance, decreased mobility, difficulty walking, decreased ROM, decreased strength, impaired flexibility, impaired sensation, and pain.    ACTIVITY LIMITATIONS carrying, lifting, bending, standing, squatting, sleeping, and locomotion level   PARTICIPATION LIMITATIONS: shopping, community activity, yard work, and school    PERSONAL FACTORS Age, Past/current experiences, Time since onset of injury/illness/exacerbation, and 3+ comorbidities: h/o PD, T2DM, and HTN   are also affecting patient's functional outcome.    REHAB POTENTIAL: Good   CLINICAL DECISION MAKING: Evolving/moderate complexity   EVALUATION COMPLEXITY: Moderate     GOALS: Goals reviewed with patient? No   SHORT TERM GOALS: Target date: 01/08/2022   Pt will be independent with HEP in order to improve strength and balance in order to decrease fall risk and improve function at home and work. Baseline: NT  Goal status: Ongoing    2.  Patient will achieve full lumbar flexion as evidence of improved hamstring flexibility and improved spinal mobility to be able to bend down and pickup objects in his environment for self-care tasks  Baseline: 80% Flexion  Goal status: Ongoing    3.  Pt will increase 6MWT by at least  63m(163f in order to demonstrate clinically significant improvement in cardiopulmonary endurance and community ambulation Baseline: 1,200 ft  Goal status: ONGOING          LONG TERM GOALS: Target date: 02/19/2022   Patient will have improved function and activity level as evidenced by an increase in FOTO score by 10 points or more.  Baseline: 52/100 Target 60   Goal status: ONGOING    2.  Patient will improve hip strength by 1/2 grade MMT to offload surrounding structures of spine in order to improve symptoms.  Baseline: Hip Ext R/L 4/4, Hip Abd R/L 4-/4-, Hip Add R/L 4-/4- Goal status: ONGOING    3.  Patient will improve aerobic endurance on 43m79mto 1,000 ft in order to show community ambulator status and improved low back pain to tolerate upright activity.   Baseline: 1,200 ft  Goal status: Deferred    4. Patient will demonstrate improved core strength as evidenced by maintaining 40 mmHg pressure during stabilizer biofeedback measurement of TA in supine and with leg loading.  Baseline: Not performed  Goal status:  Deferred      PLAN: PT FREQUENCY: 1-2x/week   PT DURATION: 8 weeks   PLANNED INTERVENTIONS: Therapeutic exercises, Therapeutic activity, Gait training, Patient/Family education, Self Care, Joint mobilization, Joint manipulation, Stair training, Vestibular training, Canalith repositioning, Aquatic Therapy, Dry Needling, Electrical stimulation, Spinal manipulation, Spinal mobilization, Cryotherapy, Moist heat, Traction, Manual therapy, and Re-evaluation.   PLAN FOR NEXT SESSION: Take FOTO. Progress parascapular and  hip strengthening and stretching exercises, rotational and postural exercises.     DanBradly Chris, DPT  01/08/2022, 11:48 AM

## 2022-01-14 ENCOUNTER — Encounter: Payer: Self-pay | Admitting: Physical Therapy

## 2022-01-14 ENCOUNTER — Ambulatory Visit: Payer: Medicare Other | Admitting: Physical Therapy

## 2022-01-14 DIAGNOSIS — M5459 Other low back pain: Secondary | ICD-10-CM | POA: Diagnosis not present

## 2022-01-14 DIAGNOSIS — R262 Difficulty in walking, not elsewhere classified: Secondary | ICD-10-CM | POA: Diagnosis not present

## 2022-01-14 DIAGNOSIS — R269 Unspecified abnormalities of gait and mobility: Secondary | ICD-10-CM

## 2022-01-14 NOTE — Therapy (Signed)
OUTPATIENT PHYSICAL THERAPY TREATMENT NOTE   Patient Name: Johnathan Arnold. MRN: [id number] DOB:[birth year], 76 y.o., male Today's Date: 01/14/2022  PCP: Dr. Derrel Nip  REFERRING PROVIDER: Dr. Derrel Nip   END OF SESSION:   PT End of Session - 01/14/22 1334     Visit Number 5    Number of Visits 16    Date for PT Re-Evaluation 02/18/22    Authorization Type Medicare    PT Start Time 1335    PT Stop Time 1415    PT Time Calculation (min) 40 min    Activity Tolerance Patient tolerated treatment well    Behavior During Therapy [facility] for tasks assessed/performed             Past Medical History:  Diagnosis Date   3-vessel coronary artery disease    s/p  5 vessel CABG   Diabetes mellitus without complication ([place])    History of cardiac catheterization 2011   [facility]   Hyperlipidemia    Hypertension    Hypertriglyceridemia    Parkinson's disease ([place])    Pneumonia 12/28/2020   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 LIMA to LAD, SVG to D1, OM1 and RPDA   CORONARY ARTERY BYPASS GRAFT  03/1998   5 vessel, [facility]   RIGHT HEART CATH N/A 04/04/2019   Procedure: RIGHT HEART CATH;  Surgeon: Wellington Hampshire, MD;  Location: [facility];  Service: Cardiovascular;  Laterality: N/A;   RIGHT/LEFT HEART CATH AND CORONARY ANGIOGRAPHY N/A 02/01/2018   Procedure: RIGHT/LEFT HEART CATH AND CORONARY ANGIOGRAPHY;  Surgeon: Wellington Hampshire, MD;  Location: [facility];  Service: Cardiovascular;  Laterality: N/A;   Patient Active Problem List   Diagnosis Date Noted   Hospital discharge follow-up 05/16/2021   Generalized weakness 05/07/2021   Fall    Head injury    Bradycardia    Anemia, unspecified 02/07/2021   Neutropenia ([place]) 01/29/2021   Thrombocytopenia ([place]) 01/29/2021   Hyponatremia 12/22/2020   Bilateral leg weakness 12/20/2020   Prostate cancer screening  09/08/2020   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[place]) 09/06/2020   Low back pain of over 3 months duration 08/19/2019   Pulmonary hypertension ([place])    Insomnia 12/21/2018   Sleep apnea in adult 12/09/2018   Periodic limb movement disorder 12/09/2018   Pulmonary nodules 05/18/2018   Wears hearing aid in both ears 05/17/2018   Leg pain, bilateral 02/20/2018   Dyspnea    CKD (chronic kidney disease) stage 3, GFR 30-59 ml/min ([place]) 09/21/2017   History of skin cancer in adulthood 08/14/2016   Parkinson's disease ([place]) 08/09/2016   Bilateral carotid artery stenosis 04/02/2015   Vertigo, peripheral 10/17/2014   Benign prostatic hypertrophy with urinary frequency 01/31/2014   Encounter for Medicare annual wellness exam 07/02/2013   Obesity 04/03/2013   Other malaise and fatigue 09/21/2012   Hyperlipidemia    Hypertension    3-vessel coronary artery disease    S/P CABG x 5     REFERRING DIAG: Chronic low back pain   THERAPY DIAG:  Other low back pain  Difficulty in walking, not elsewhere classified  Abnormality of gait and mobility  Rationale for Evaluation and Treatment Rehabilitation  PERTINENT HISTORY: Per Dr. Lupita Dawn note on 09/05/21   He feels generally well, is exercising several times per week but limited by back  pain, which is chronic  , and parkinson's disease.  No lonver checking blood sugars since he no longer meets criteria for DM based on recurrent a1cs  < 5.8 on diet alone .    HTN:     Taking losartan  25 mg and amlodipine  2.5 mg  daily as directed. Walking regularly,  has been advised to be aware of orthostatic symptoms and need to moidfy medications in the future. Reviewed the pathophysiology of PD orthostass and the effect of medications on these symptoms    He reports that he has been having lower back pain for years.  It is non radiating,  it does not include spinal tenderness.  His back pain is aggravated by prolonged standing and by any  activities. Pain  is left of center at L2 level.   Had PT at  their outpatient [address] location  in the past with good results,  but has not been doing the home exercises .     PD:  has good days and bad days energy level .  Affected by periods of low blood pressure reviewed his medication regimen and schedule   PRECAUTIONS: None   SUBJECTIVE: Patient reports that his back pain has been improving and that he feels slightly more today then he did this weekend.   PAIN:  Are you having pain? No   OBJECTIVE:   VITALS: BP 122/49 HR 58 SpO2 100   DIAGNOSTIC FINDINGS:  CLINICAL DATA:  Low back pain, left leg and buttock pain   EXAM: MRI LUMBAR SPINE WITHOUT CONTRAST   TECHNIQUE: Multiplanar, multisequence MR imaging of the lumbar spine was performed. No intravenous contrast was administered.   COMPARISON:  None.   FINDINGS: Motion artifact is present.   Segmentation: Standard.   Alignment:  Trace retrolisthesis at L1-L2, L2-L3, and L3-L4.   Vertebrae: There is chronic compression deformity of L1 with less than 50% loss of height at superior and inferior endplates. Mild posterior osseous retropulsion. Additional degenerative endplate irregularity at several levels. There is no substantial marrow edema. No suspicious osseous lesion.   Conus medullaris and cauda equina: Conus extends to the L1 level. Conus and cauda equina appear normal.   Paraspinal and other soft tissues: Unremarkable.   Disc levels:   T12-L1: Minimal disc bulge. Mild posterior retropulsion of L1. No significant canal or foraminal stenosis.   L1-L2: Disc bulge.  No significant canal or foraminal stenosis.   L2-L3:  Disc bulge.  No significant canal or foraminal stenosis.   L3-L4: Disc bulge. Facet arthropathy with ligamentum flavum infolding. Minor canal stenosis. No significant right foraminal stenosis. Minor left foraminal stenosis.   L4-L5: Disc bulge. Facet arthropathy with ligamentum  flavum infolding. Minor canal stenosis. Slight effacement of the left greater than right subarticular recesses. Minor right greater than left foraminal stenosis.   L5-S1: Very small central disc protrusion. No significant canal or foraminal stenosis.   IMPRESSION: Multilevel degenerative changes as detailed above without high-grade stenosis.     Electronically Signed   By: Macy Mis M.D.   On: 11/15/2020 15:07   PATIENT SURVEYS:  FOTO 52/100   SCREENING FOR RED FLAGS: Bowel or bladder incontinence: No Spinal tumors: No Cauda equina syndrome: No Compression fracture: No Abdominal aneurysm: No   COGNITION:           Overall cognitive status: Within functional limits for tasks assessed  SENSATION: WFL   MUSCLE LENGTH: Hamstrings: Right 60 deg; Left 60 deg Thomas test: Negative bilateral    POSTURE: rounded shoulders and forward head   PALPATION: Left glute and L2 central spinous process    LUMBAR ROM:    Active  A/PROM  eval  Flexion 80%  Extension 100%  Right lateral flexion 100%  Left lateral flexion 100%  Right rotation 100%  Left rotation 100%   (Blank rows = not tested)   LOWER EXTREMITY ROM:          Active  Right 12/25/2021 Left 12/25/2021  Hip flexion 120 120  Hip extension 30 30  Hip abduction 45 45  Hip adduction 30 30  Hip internal rotation 45 45  Hip external rotation 45 45  Knee flexion 135 135  Knee extension 0 0  Ankle dorsiflexion 20 20  Ankle plantarflexion 50 50  Ankle inversion 35 35  Ankle eversion 15 15   (Blank rows = not tested)        LOWER EXTREMITY MMT:     MMT Right eval Left eval  Hip flexion 5 5  Hip extension 4 4  Hip abduction 4- 4-  Hip adduction 4- 4-  Hip internal rotation 5 5  Hip external rotation 5 5  Knee flexion 4+ 4+  Knee extension 5 5  Ankle dorsiflexion 5 5  Ankle plantarflexion 5 5  Ankle inversion      Ankle eversion       (Blank rows = not tested)    LUMBAR SPECIAL TESTS:  FABER test: Negative and FADIR test: Negative    FUNCTIONAL TESTS:  5 times sit to stand: 11.5 sec  30 seconds chair stand test 13 reps -<11 reps for males 75-79 are at increased risk for falls TA Activation with BP Test: Average about 60 mmHg    GAIT: Distance walked: 40 FT  Assistive device utilized: None Level of assistance: Complete Independence Comments: FHRSP/kyphotic posture and decreased stride length        TODAY'S TREATMENT   01/14/22  THEREX   TM at 1.5 mph with BUE support for  5 min  FOTO: 56/100 with target of 60  Lower Trunk Rotation 3 x 10 with 3 sec hold  Prone Push Ups 1 x 10  Standing Hip Abduction 3 x 10  -min VC to decrease distance of swing leg Mini-Squat with extended elbows and flexed shoulders to 90 with #8 Jug of water 2 x 10 Mini-Squat with extended elbows and flexed shoulders to 90 with #5 DB 1 x 10    MANUAL THERAPY    Prone Quad Stretch with PT overpressure  Prone Hip Flexor Stretch with PT overpressure   01/08/22 Nu-Step seat at 7 and arms at 4 for 5 min   Omega Seated Rows #25 1 x 10  Omega Seated Rows #35 1 x 8  Omega Seated Rows #40 1 x 8   Bird Dogs 3 x 8  -min VC to maintain level back to avoid pelvic tilt   Oblique Bicycle Crunch 3 x 8  - min VC for sequence of exercise   Shoulder Horizontal Abduction at 90 deg 3 x 8 with Green band   Seated Rows with Black TB 3 x 10    01/01/22  TM 1.5 mph 5 min  Lower Trunk Rotations 3 x 10  Thoracic Rotation 3 x 10  Mini-Squats 1 x 10  Mini-Squats 2 x 10 with jug of water  AAROM Lumbar  Flexion Center, Right, and Left Silver Ball Rolls 3 x 10  Forward Flexion 3 x 30 sec  Seated Rows with Blue TB 3 x 10  Shoulder Horizontal Abduction at 90 degrees with red TB 3 x 10    12/26/21 3 min on Nu-Step with Level 2 resistance level 7 for arms and pedals  91mT: 1,200 feet NPS 1/10 low back pain   TA Activation with  Supine Double Bent Leg Lifts 1 x 10  Supine  Double Bent Leg Lifts with leg extension 2 x 10  -min VC for leg extension hold  Prone Push Ups on Elbow 1 x 10 with 5 sec hold  Modified Thomas Stretch 4 x 60 sec with addition of strap  -Pt provided overpressure for portion of exercise   Supine Bridges 1 x 10  Supine Bridges 2 x 10 from 8 inch platform   Initial on 12/24/21  Lower Trunk Rotations 1 x 10  Butterfly with forward flexion 3 x 60 sec  Seated HS Stretch 2 x 60 sec      PATIENT EDUCATION:  Education details: form and technique and explanation of functional outcome measures  Person educated: Patient Education method: Explanation, Handout Education comprehension: verbalized understanding, returned demonstration, and verbal cues required     HOME EXERCISE PROGRAM: Access Code: [email or web address]: [email or web address] Date: 01/14/2022 Prepared by: DBradly Chris Exercises - Supine Lower Trunk Rotation  - 1 x daily - 7 x weekly - 3 sets - 10 reps - 3 hold - Seated Table Hamstring Stretch  - 1 x daily - 7 x weekly - 1 sets - 3 reps - 60 hold - Bound Angle Hands Forward   - 1 x daily - 7 x weekly - 1 sets - 3 reps - 60 hold - Sidelying Open Book Thoracic Lumbar Rotation and Extension  - 1 x daily - 7 x weekly - 3 sets - 10 reps - Mini Squat  - 1 x daily - 3 x weekly - 3 sets - 10 reps - Seated Shoulder Row with Anchored Resistance  - 1 x daily - 3 x weekly - 3 sets - 10 reps - Standing Shoulder Horizontal Abduction with Resistance  - 1 x daily - 3 x weekly - 3 sets - 8 reps - Bird Dog  - 1 x daily - 3 x weekly - 3 sets - 10 reps - Oblique Bicycle Crunch  - 1 x daily - 3 x weekly - 3 sets - 8 reps   ASSESSMENT:   CLINICAL IMPRESSION: Pt continues to exhibit an increase in his parascapular and hip strength with ability to perform crunches and increased resistance with ability to perform shoulder horizontal abduction with increased resistance and mini-squats with increased resistance. Mini-squat modified to  include decreased weight because of pt's shoulder weakness. He will continue to benefit from PT to address the aforementioned deficits to return to completing standing activities like walking and self-care tasks without needing rest breaks and without feeling severe pain.    OBJECTIVE IMPAIRMENTS Abnormal gait, decreased endurance, decreased mobility, difficulty walking, decreased ROM, decreased strength, impaired flexibility, impaired sensation, and pain.    ACTIVITY LIMITATIONS carrying, lifting, bending, standing, squatting, sleeping, and locomotion level   PARTICIPATION LIMITATIONS: shopping, community activity, yard work, and school   PERSONAL FACTORS Age, Past/current experiences, Time since onset of injury/illness/exacerbation, and 3+ comorbidities: h/o PD, T2DM, and HTN   are also affecting patient's functional outcome.    REHAB POTENTIAL: Good  CLINICAL DECISION MAKING: Evolving/moderate complexity   EVALUATION COMPLEXITY: Moderate     GOALS: Goals reviewed with patient? No   SHORT TERM GOALS: Target date: 01/08/2022   Pt will be independent with HEP in order to improve strength and balance in order to decrease fall risk and improve function at home and work. Baseline: NT  Goal status: Ongoing    2.  Patient will achieve full lumbar flexion as evidence of improved hamstring flexibility and improved spinal mobility to be able to bend down and pickup objects in his environment for self-care tasks  Baseline: 80% Flexion  Goal status: Ongoing    3.  Pt will increase 6MWT by at least 70m(1610f in order to demonstrate clinically significant improvement in cardiopulmonary endurance and community ambulation Baseline: 1,200 ft  Goal status: ONGOING          LONG TERM GOALS: Target date: 02/19/2022   Patient will have improved function and activity level as evidenced by an increase in FOTO score by 10 points or more.  Baseline: 52/100 Target 60  01/14/22: 56 Goal status: ONGOING     2.  Patient will improve hip strength by 1/2 grade MMT to offload surrounding structures of spine in order to improve symptoms.  Baseline: Hip Ext R/L 4/4, Hip Abd R/L 4-/4-, Hip Add R/L 4-/4- Goal status: ONGOING    3.  Patient will improve aerobic endurance on 68m99mto 1,000 ft in order to show community ambulator status and improved low back pain to tolerate upright activity.   Baseline: 1,200 ft  Goal status: Deferred    4. Patient will demonstrate improved core strength as evidenced by maintaining 40 mmHg pressure during stabilizer biofeedback measurement of TA in supine and with leg loading.  Baseline: Not performed  Goal status: Deferred      PLAN: PT FREQUENCY: 1-2x/week   PT DURATION: 8 weeks   PLANNED INTERVENTIONS: Therapeutic exercises, Therapeutic activity, Gait training, Patient/Family education, Self Care, Joint mobilization, Joint manipulation, Stair training, Vestibular training, Canalith repositioning, Aquatic Therapy, Dry Needling, Electrical stimulation, Spinal manipulation, Spinal mobilization, Cryotherapy, Moist heat, Traction, Manual therapy, and Re-evaluation.   PLAN FOR NEXT SESSION:  Progress parascapular and  hip strengthening and stretching exercises, rotational and postural exercises.     DanBradly Chris, DPT  01/14/2022, 1:35 PM

## 2022-01-16 ENCOUNTER — Encounter: Payer: Self-pay | Admitting: Physical Therapy

## 2022-01-16 ENCOUNTER — Ambulatory Visit: Payer: Medicare Other | Admitting: Physical Therapy

## 2022-01-16 DIAGNOSIS — M5459 Other low back pain: Secondary | ICD-10-CM

## 2022-01-16 DIAGNOSIS — R262 Difficulty in walking, not elsewhere classified: Secondary | ICD-10-CM

## 2022-01-16 DIAGNOSIS — R269 Unspecified abnormalities of gait and mobility: Secondary | ICD-10-CM | POA: Diagnosis not present

## 2022-01-16 NOTE — Therapy (Signed)
OUTPATIENT PHYSICAL THERAPY TREATMENT NOTE   Patient Name: Johnathan Arnold. MRN: [id number] DOB:1945-12-13, 76 y.o., male Today's Date: 01/16/2022  PCP: Dr. Derrel Nip  REFERRING PROVIDER: Dr. Derrel Nip   END OF SESSION:   PT End of Session - 01/16/22 1337     Visit Number 6    Number of Visits 16    Date for PT Re-Evaluation 02/18/22    Authorization Type Medicare    PT Start Time 1330    PT Stop Time 6294    PT Time Calculation (min) 45 min    Activity Tolerance Patient tolerated treatment well    Behavior During Therapy [facility] for tasks assessed/performed             Past Medical History:  Diagnosis Date   3-vessel coronary artery disease    s/p  5 vessel CABG   Diabetes mellitus without complication ([place])    History of cardiac catheterization 2011   [facility]   Hyperlipidemia    Hypertension    Hypertriglyceridemia    Parkinson's disease ([place])    Pneumonia 12/28/2020   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 LIMA to LAD, SVG to D1, OM1 and RPDA   CORONARY ARTERY BYPASS GRAFT  03/1998   5 vessel, [facility]   RIGHT HEART CATH N/A 04/04/2019   Procedure: RIGHT HEART CATH;  Surgeon: Wellington Hampshire, MD;  Location: [facility];  Service: Cardiovascular;  Laterality: N/A;   RIGHT/LEFT HEART CATH AND CORONARY ANGIOGRAPHY N/A 02/01/2018   Procedure: RIGHT/LEFT HEART CATH AND CORONARY ANGIOGRAPHY;  Surgeon: Wellington Hampshire, MD;  Location: [facility];  Service: Cardiovascular;  Laterality: N/A;   Patient Active Problem List   Diagnosis Date Noted   Hospital discharge follow-up 05/16/2021   Generalized weakness 05/07/2021   Fall    Head injury    Bradycardia    Anemia, unspecified 02/07/2021   Neutropenia ([place]) 01/29/2021   Thrombocytopenia ([place]) 01/29/2021   Hyponatremia 12/22/2020   Bilateral leg weakness 12/20/2020   Prostate cancer screening  09/08/2020   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[place]) 09/06/2020   Low back pain of over 3 months duration 08/19/2019   Pulmonary hypertension ([place])    Insomnia 12/21/2018   Sleep apnea in adult 12/09/2018   Periodic limb movement disorder 12/09/2018   Pulmonary nodules 05/18/2018   Wears hearing aid in both ears 05/17/2018   Leg pain, bilateral 02/20/2018   Dyspnea    CKD (chronic kidney disease) stage 3, GFR 30-59 ml/min ([place]) 09/21/2017   History of skin cancer in adulthood 08/14/2016   Parkinson's disease ([place]) 08/09/2016   Bilateral carotid artery stenosis 04/02/2015   Vertigo, peripheral 10/17/2014   Benign prostatic hypertrophy with urinary frequency 01/31/2014   Encounter for Medicare annual wellness exam 07/02/2013   Obesity 04/03/2013   Other malaise and fatigue 09/21/2012   Hyperlipidemia    Hypertension    3-vessel coronary artery disease    S/P CABG x 5     REFERRING DIAG: Chronic low back pain   THERAPY DIAG:  Other low back pain  Difficulty in walking, not elsewhere classified  Rationale for Evaluation and Treatment Rehabilitation  PERTINENT HISTORY: Per Dr. Lupita Dawn note on 09/05/21   He feels generally well, is exercising several times per week but limited by back pain, which is chronic  ,  and parkinson's disease.  No lonver checking blood sugars since he no longer meets criteria for DM based on recurrent a1cs  < 5.8 on diet alone .    HTN:     Taking losartan  25 mg and amlodipine  2.5 mg  daily as directed. Walking regularly,  has been advised to be aware of orthostatic symptoms and need to moidfy medications in the future. Reviewed the pathophysiology of PD orthostass and the effect of medications on these symptoms    He reports that he has been having lower back pain for years.  It is non radiating,  it does not include spinal tenderness.  His back pain is aggravated by prolonged standing and by any activities. Pain  is left of center at  L2 level.   Had PT at  their outpatient [address] location  in the past with good results,  but has not been doing the home exercises .     PD:  has good days and bad days energy level .  Affected by periods of low blood pressure reviewed his medication regimen and schedule   PRECAUTIONS: None   SUBJECTIVE: Patient states that his back pain feels better. He was feeling increased fatigue yesterday after completing rock steady to point where he had to go to bed earlier.  PAIN:  Are you having pain? No   OBJECTIVE:   VITALS: BP 122/49 HR 58 SpO2 100   DIAGNOSTIC FINDINGS:  CLINICAL DATA:  Low back pain, left leg and buttock pain   EXAM: MRI LUMBAR SPINE WITHOUT CONTRAST   TECHNIQUE: Multiplanar, multisequence MR imaging of the lumbar spine was performed. No intravenous contrast was administered.   COMPARISON:  None.   FINDINGS: Motion artifact is present.   Segmentation: Standard.   Alignment:  Trace retrolisthesis at L1-L2, L2-L3, and L3-L4.   Vertebrae: There is chronic compression deformity of L1 with less than 50% loss of height at superior and inferior endplates. Mild posterior osseous retropulsion. Additional degenerative endplate irregularity at several levels. There is no substantial marrow edema. No suspicious osseous lesion.   Conus medullaris and cauda equina: Conus extends to the L1 level. Conus and cauda equina appear normal.   Paraspinal and other soft tissues: Unremarkable.   Disc levels:   T12-L1: Minimal disc bulge. Mild posterior retropulsion of L1. No significant canal or foraminal stenosis.   L1-L2: Disc bulge.  No significant canal or foraminal stenosis.   L2-L3:  Disc bulge.  No significant canal or foraminal stenosis.   L3-L4: Disc bulge. Facet arthropathy with ligamentum flavum infolding. Minor canal stenosis. No significant right foraminal stenosis. Minor left foraminal stenosis.   L4-L5: Disc bulge. Facet arthropathy with ligamentum  flavum infolding. Minor canal stenosis. Slight effacement of the left greater than right subarticular recesses. Minor right greater than left foraminal stenosis.   L5-S1: Very small central disc protrusion. No significant canal or foraminal stenosis.   IMPRESSION: Multilevel degenerative changes as detailed above without high-grade stenosis.     Electronically Signed   By: Macy Mis M.D.   On: 11/15/2020 15:07   PATIENT SURVEYS:  FOTO 52/100   SCREENING FOR RED FLAGS: Bowel or bladder incontinence: No Spinal tumors: No Cauda equina syndrome: No Compression fracture: No Abdominal aneurysm: No   COGNITION:           Overall cognitive status: Within functional limits for tasks assessed  SENSATION: WFL   MUSCLE LENGTH: Hamstrings: Right 60 deg; Left 60 deg Thomas test: Negative bilateral    POSTURE: rounded shoulders and forward head   PALPATION: Left glute and L2 central spinous process    LUMBAR ROM:    Active  A/PROM  eval  Flexion 80%  Extension 100%  Right lateral flexion 100%  Left lateral flexion 100%  Right rotation 100%  Left rotation 100%   (Blank rows = not tested)   LOWER EXTREMITY ROM:          Active  Right 12/25/2021 Left 12/25/2021  Hip flexion 120 120  Hip extension 30 30  Hip abduction 45 45  Hip adduction 30 30  Hip internal rotation 45 45  Hip external rotation 45 45  Knee flexion 135 135  Knee extension 0 0  Ankle dorsiflexion 20 20  Ankle plantarflexion 50 50  Ankle inversion 35 35  Ankle eversion 15 15   (Blank rows = not tested)        LOWER EXTREMITY MMT:     MMT Right eval Left eval  Hip flexion 5 5  Hip extension 4 4  Hip abduction 4- 4-  Hip adduction 4- 4-  Hip internal rotation 5 5  Hip external rotation 5 5  Knee flexion 4+ 4+  Knee extension 5 5  Ankle dorsiflexion 5 5  Ankle plantarflexion 5 5  Ankle inversion      Ankle eversion       (Blank rows = not tested)    LUMBAR SPECIAL TESTS:  FABER test: Negative and FADIR test: Negative    FUNCTIONAL TESTS:  5 times sit to stand: 11.5 sec  30 seconds chair stand test 13 reps -<11 reps for males 75-79 are at increased risk for falls TA Activation with BP Test: Average about 60 mmHg    GAIT: Distance walked: 40 FT  Assistive device utilized: None Level of assistance: Complete Independence Comments: FHRSP/kyphotic posture and decreased stride length        TODAY'S TREATMENT   01/16/22 TM at 1.5 mph with BUE support for 5 min   Educated on use of RPE scale: -Moderate Activity= cannot sing favorite song=4-6 on RPE scale  -Vigorous Activity-Talk test= cannot say more than a few words out loud= 7-8 on RPE scale               Hip Hinges 1 x 10  -min VC for sequence of exercise and to maintain slight knee bend   Good Mornings with Blue TB 1 x 10  Good Morning with #8 1 gal water jug 1 x 10  Good Morning with 2 x #8 DB 1 x 10  Weighted Ab Twist #5 2 x 10   MANUAL THERAPY   HS Stretch with contract relax 4 x 30 sec   01/14/22  THEREX   TM at 1.5 mph with BUE support for  5 min  FOTO: 56/100 with target of 60  Lower Trunk Rotation 3 x 10 with 3 sec hold  Prone Push Ups 1 x 10  Standing Hip Abduction 3 x 10  -min VC to decrease distance of swing leg Mini-Squat with extended elbows and flexed shoulders to 90 with #8 Jug of water 2 x 10 Mini-Squat with extended elbows and flexed shoulders to 90 with #5 DB 1 x 10    MANUAL THERAPY    Prone Quad Stretch with PT overpressure  Prone Hip Flexor Stretch with PT overpressure  01/08/22 Nu-Step seat at 7 and arms at 4 for 5 min   Omega Seated Rows #25 1 x 10  Omega Seated Rows #35 1 x 8  Omega Seated Rows #40 1 x 8   Bird Dogs 3 x 8  -min VC to maintain level back to avoid pelvic tilt   Oblique Bicycle Crunch 3 x 8  - min VC for sequence of exercise   Shoulder Horizontal Abduction at 90 deg 3 x 8 with Green band   Seated Rows with  Black TB 3 x 10    01/01/22  TM 1.5 mph 5 min  Lower Trunk Rotations 3 x 10  Thoracic Rotation 3 x 10  Mini-Squats 1 x 10  Mini-Squats 2 x 10 with jug of water  AAROM Lumbar Flexion Center, Right, and Left Silver Ball Rolls 3 x 10  Forward Flexion 3 x 30 sec  Seated Rows with Blue TB 3 x 10  Shoulder Horizontal Abduction at 90 degrees with red TB 3 x 10         PATIENT EDUCATION:  Education details: form and technique and explanation of functional outcome measures  Person educated: Patient Education method: Explanation, Handout Education comprehension: verbalized understanding, returned demonstration, and verbal cues required     HOME EXERCISE PROGRAM: Access Code: [email or web address] URL: [email or web address] Date: 01/14/2022 Prepared by: Bradly Chris  Exercises - Supine Lower Trunk Rotation  - 1 x daily - 7 x weekly - 3 sets - 10 reps - 3 hold - Seated Table Hamstring Stretch  - 1 x daily - 7 x weekly - 1 sets - 3 reps - 60 hold - Bound Angle Hands Forward   - 1 x daily - 7 x weekly - 1 sets - 3 reps - 60 hold - Sidelying Open Book Thoracic Lumbar Rotation and Extension  - 1 x daily - 7 x weekly - 3 sets - 10 reps - Mini Squat  - 1 x daily - 3 x weekly - 3 sets - 10 reps - Seated Shoulder Row with Anchored Resistance  - 1 x daily - 3 x weekly - 3 sets - 10 reps - Standing Shoulder Horizontal Abduction with Resistance  - 1 x daily - 3 x weekly - 3 sets - 8 reps - Bird Dog  - 1 x daily - 3 x weekly - 3 sets - 10 reps - Oblique Bicycle Crunch  - 1 x daily - 3 x weekly - 3 sets - 8 reps   ASSESSMENT:   CLINICAL IMPRESSION:  Pt continues to show an improvement in hip, low back , and abdominal strength with ability to complete exercises with increased resistance and without an increase in his back pain. Hip hinges ROM movement for good mornings limited by hamstring length.  Given consistent decreased low back pain, pt is likely nearing end of POC and he should be ready  for discharge soon. He will continue to benefit from PT to address the aforementioned deficits to return to completing standing activities like walking and self-care tasks without needing rest breaks and without feeling severe pain.    OBJECTIVE IMPAIRMENTS Abnormal gait, decreased endurance, decreased mobility, difficulty walking, decreased ROM, decreased strength, impaired flexibility, impaired sensation, and pain.    ACTIVITY LIMITATIONS carrying, lifting, bending, standing, squatting, sleeping, and locomotion level   PARTICIPATION LIMITATIONS: shopping, community activity, yard work, and school   PERSONAL FACTORS Age, Past/current experiences, Time since onset of injury/illness/exacerbation, and 3+ comorbidities: h/o PD, T2DM, and  HTN   are also affecting patient's functional outcome.    REHAB POTENTIAL: Good   CLINICAL DECISION MAKING: Evolving/moderate complexity   EVALUATION COMPLEXITY: Moderate     GOALS: Goals reviewed with patient? No   SHORT TERM GOALS: Target date: 01/08/2022   Pt will be independent with HEP in order to improve strength and balance in order to decrease fall risk and improve function at home and work. Baseline: NT  Goal status: Ongoing    2.  Patient will achieve full lumbar flexion as evidence of improved hamstring flexibility and improved spinal mobility to be able to bend down and pickup objects in his environment for self-care tasks  Baseline: 80% Flexion  Goal status: Ongoing    3.  Pt will increase 6MWT by at least 63m(1660f in order to demonstrate clinically significant improvement in cardiopulmonary endurance and community ambulation Baseline: 1,200 ft  Goal status: ONGOING          LONG TERM GOALS: Target date: 02/19/2022   Patient will have improved function and activity level as evidenced by an increase in FOTO score by 10 points or more.  Baseline: 52/100 Target 60  01/14/22: 56 Goal status: ONGOING    2.  Patient will improve hip  strength by 1/2 grade MMT to offload surrounding structures of spine in order to improve symptoms.  Baseline: Hip Ext R/L 4/4, Hip Abd R/L 4-/4-, Hip Add R/L 4-/4- Goal status: ONGOING    3.  Patient will improve aerobic endurance on 68m71mto 1,000 ft in order to show community ambulator status and improved low back pain to tolerate upright activity.   Baseline: 1,200 ft  Goal status: Deferred    4. Patient will demonstrate improved core strength as evidenced by maintaining 40 mmHg pressure during stabilizer biofeedback measurement of TA in supine and with leg loading.  Baseline: Not performed  Goal status: Deferred      PLAN: PT FREQUENCY: 1-2x/week   PT DURATION: 8 weeks   PLANNED INTERVENTIONS: Therapeutic exercises, Therapeutic activity, Gait training, Patient/Family education, Self Care, Joint mobilization, Joint manipulation, Stair training, Vestibular training, Canalith repositioning, Aquatic Therapy, Dry Needling, Electrical stimulation, Spinal manipulation, Spinal mobilization, Cryotherapy, Moist heat, Traction, Manual therapy, and Re-evaluation.   PLAN FOR NEXT SESSION:  SLS exercises for balance and strength. Progress parascapular and  hip strengthening and stretching exercises, rotational and postural exercises.     DanBradly Chris, DPT  01/16/2022, 1:38 PM

## 2022-01-21 ENCOUNTER — Ambulatory Visit: Payer: Medicare Other | Admitting: Physical Therapy

## 2022-01-21 ENCOUNTER — Encounter: Payer: Self-pay | Admitting: Physical Therapy

## 2022-01-21 DIAGNOSIS — R269 Unspecified abnormalities of gait and mobility: Secondary | ICD-10-CM | POA: Diagnosis not present

## 2022-01-21 DIAGNOSIS — M5459 Other low back pain: Secondary | ICD-10-CM | POA: Diagnosis not present

## 2022-01-21 DIAGNOSIS — R262 Difficulty in walking, not elsewhere classified: Secondary | ICD-10-CM | POA: Diagnosis not present

## 2022-01-21 NOTE — Therapy (Signed)
OUTPATIENT PHYSICAL THERAPY TREATMENT NOTE   Patient Name: Johnathan Arnold. MRN: [id number] DOB:1945/09/22, 76 y.o., male Today's Date: 01/21/2022  PCP: Dr. Derrel Nip  REFERRING PROVIDER: Dr. Derrel Nip   END OF SESSION:   PT End of Session - 01/21/22 1334     Visit Number 7    Number of Visits 16    Date for PT Re-Evaluation 02/18/22    Authorization Type Medicare    PT Start Time 1330    PT Stop Time 6295    PT Time Calculation (min) 45 min    Activity Tolerance Patient tolerated treatment well    Behavior During Therapy [facility] for tasks assessed/performed             Past Medical History:  Diagnosis Date   3-vessel coronary artery disease    s/p  5 vessel CABG   Diabetes mellitus without complication ([place])    History of cardiac catheterization 2011   [facility]   Hyperlipidemia    Hypertension    Hypertriglyceridemia    Parkinson's disease ([place])    Pneumonia 12/28/2020   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 LIMA to LAD, SVG to D1, OM1 and RPDA   CORONARY ARTERY BYPASS GRAFT  03/1998   5 vessel, [facility]   RIGHT HEART CATH N/A 04/04/2019   Procedure: RIGHT HEART CATH;  Surgeon: Wellington Hampshire, MD;  Location: [place] [facility];  Service: Cardiovascular;  Laterality: N/A;   RIGHT/LEFT HEART CATH AND CORONARY ANGIOGRAPHY N/A 02/01/2018   Procedure: RIGHT/LEFT HEART CATH AND CORONARY ANGIOGRAPHY;  Surgeon: Wellington Hampshire, MD;  Location: [place] [facility];  Service: Cardiovascular;  Laterality: N/A;   Patient Active Problem List   Diagnosis Date Noted   Hospital discharge follow-up 05/16/2021   Generalized weakness 05/07/2021   Fall    Head injury    Bradycardia    Anemia, unspecified 02/07/2021   Neutropenia ([place]) 01/29/2021   Thrombocytopenia ([place]) 01/29/2021   Hyponatremia 12/22/2020   Bilateral leg weakness 12/20/2020   Prostate cancer screening  09/08/2020   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[place]) 09/06/2020   Low back pain of over 3 months duration 08/19/2019   Pulmonary hypertension ([place])    Insomnia 12/21/2018   Sleep apnea in adult 12/09/2018   Periodic limb movement disorder 12/09/2018   Pulmonary nodules 05/18/2018   Wears hearing aid in both ears 05/17/2018   Leg pain, bilateral 02/20/2018   Dyspnea    CKD (chronic kidney disease) stage 3, GFR 30-59 ml/min ([place]) 09/21/2017   History of skin cancer in adulthood 08/14/2016   Parkinson's disease ([place]) 08/09/2016   Bilateral carotid artery stenosis 04/02/2015   Vertigo, peripheral 10/17/2014   Benign prostatic hypertrophy with urinary frequency 01/31/2014   Encounter for Medicare annual wellness exam 07/02/2013   Obesity 04/03/2013   Other malaise and fatigue 09/21/2012   Hyperlipidemia    Hypertension    3-vessel coronary artery disease    S/P CABG x 5     REFERRING DIAG: Chronic low back pain   THERAPY DIAG:  Other low back pain  Difficulty in walking, not elsewhere classified  Rationale for Evaluation and Treatment Rehabilitation  PERTINENT HISTORY: Per Dr. Lupita Dawn note on 09/05/21   He feels generally well, is exercising several times per week but limited by back pain, which is chronic  ,  and parkinson's disease.  No lonver checking blood sugars since he no longer meets criteria for DM based on recurrent a1cs  < 5.8 on diet alone .    HTN:     Taking losartan  25 mg and amlodipine  2.5 mg  daily as directed. Walking regularly,  has been advised to be aware of orthostatic symptoms and need to moidfy medications in the future. Reviewed the pathophysiology of PD orthostass and the effect of medications on these symptoms    He reports that he has been having lower back pain for years.  It is non radiating,  it does not include spinal tenderness.  His back pain is aggravated by prolonged standing and by any activities. Pain  is left of center at  L2 level.   Had PT at  their outpatient [address] location  in the past with good results,  but has not been doing the home exercises .     PD:  has good days and bad days energy level .  Affected by periods of low blood pressure reviewed his medication regimen and schedule   PRECAUTIONS: None   SUBJECTIVE: Patient reports that he feels like his back pain is improving. He only occasionally feels the pain, but it seems to resolve itself.  PAIN:  Are you having pain? No   OBJECTIVE:   VITALS: BP 122/49 HR 58 SpO2 100   DIAGNOSTIC FINDINGS:  CLINICAL DATA:  Low back pain, left leg and buttock pain   EXAM: MRI LUMBAR SPINE WITHOUT CONTRAST   TECHNIQUE: Multiplanar, multisequence MR imaging of the lumbar spine was performed. No intravenous contrast was administered.   COMPARISON:  None.   FINDINGS: Motion artifact is present.   Segmentation: Standard.   Alignment:  Trace retrolisthesis at L1-L2, L2-L3, and L3-L4.   Vertebrae: There is chronic compression deformity of L1 with less than 50% loss of height at superior and inferior endplates. Mild posterior osseous retropulsion. Additional degenerative endplate irregularity at several levels. There is no substantial marrow edema. No suspicious osseous lesion.   Conus medullaris and cauda equina: Conus extends to the L1 level. Conus and cauda equina appear normal.   Paraspinal and other soft tissues: Unremarkable.   Disc levels:   T12-L1: Minimal disc bulge. Mild posterior retropulsion of L1. No significant canal or foraminal stenosis.   L1-L2: Disc bulge.  No significant canal or foraminal stenosis.   L2-L3:  Disc bulge.  No significant canal or foraminal stenosis.   L3-L4: Disc bulge. Facet arthropathy with ligamentum flavum infolding. Minor canal stenosis. No significant right foraminal stenosis. Minor left foraminal stenosis.   L4-L5: Disc bulge. Facet arthropathy with ligamentum flavum infolding. Minor canal  stenosis. Slight effacement of the left greater than right subarticular recesses. Minor right greater than left foraminal stenosis.   L5-S1: Very small central disc protrusion. No significant canal or foraminal stenosis.   IMPRESSION: Multilevel degenerative changes as detailed above without high-grade stenosis.     Electronically Signed   By: Macy Mis M.D.   On: 11/15/2020 15:07   PATIENT SURVEYS:  FOTO 52/100   SCREENING FOR RED FLAGS: Bowel or bladder incontinence: No Spinal tumors: No Cauda equina syndrome: No Compression fracture: No Abdominal aneurysm: No   COGNITION:           Overall cognitive status: Within functional limits for tasks assessed  SENSATION: WFL   MUSCLE LENGTH: Hamstrings: Right 60 deg; Left 60 deg Thomas test: Negative bilateral    POSTURE: rounded shoulders and forward head   PALPATION: Left glute and L2 central spinous process    LUMBAR ROM:    Active  A/PROM  eval  Flexion 80%  Extension 100%  Right lateral flexion 100%  Left lateral flexion 100%  Right rotation 100%  Left rotation 100%   (Blank rows = not tested)   LOWER EXTREMITY ROM:          Active  Right 12/25/2021 Left 12/25/2021  Hip flexion 120 120  Hip extension 30 30  Hip abduction 45 45  Hip adduction 30 30  Hip internal rotation 45 45  Hip external rotation 45 45  Knee flexion 135 135  Knee extension 0 0  Ankle dorsiflexion 20 20  Ankle plantarflexion 50 50  Ankle inversion 35 35  Ankle eversion 15 15   (Blank rows = not tested)        LOWER EXTREMITY MMT:     MMT Right eval Left eval  Hip flexion 5 5  Hip extension 4 4  Hip abduction 4- 4-  Hip adduction 4- 4-  Hip internal rotation 5 5  Hip external rotation 5 5  Knee flexion 4+ 4+  Knee extension 5 5  Ankle dorsiflexion 5 5  Ankle plantarflexion 5 5  Ankle inversion      Ankle eversion       (Blank rows = not tested)   LUMBAR SPECIAL TESTS:  FABER  test: Negative and FADIR test: Negative    FUNCTIONAL TESTS:  5 times sit to stand: 11.5 sec  30 seconds chair stand test 13 reps -<11 reps for males 75-79 are at increased risk for falls TA Activation with BP Test: Average about 60 mmHg    GAIT: Distance walked: 40 FT  Assistive device utilized: None Level of assistance: Complete Independence Comments: FHRSP/kyphotic posture and decreased stride length        TODAY'S TREATMENT   01/21/22               TM at 1.5 mph with BUE support for 5 min                              MMT: Hip Ext R/L 4+/4+, Hip Abd R/L 5/5, Hip Add R/L 4+/4+                Bird Dogs with elbow to knee taps 3 x 8                Side Lying Hip Adduction bilateral 3 x 10                Seated Scapular Rows with Gray TB 3 x 10    01/16/22 TM at 1.5 mph with BUE support for 5 min   Educated on use of RPE scale: -Moderate Activity= cannot sing favorite song=4-6 on RPE scale  -Vigorous Activity-Talk test= cannot say more than a few words out loud= 7-8 on RPE scale               Hip Hinges 1 x 10  -min VC for sequence of exercise and to maintain slight knee bend   Good Mornings with Blue TB 1 x 10  Good Morning with #8 1 gal water jug 1 x 10  Good Morning with 2 x #8  DB 1 x 10  Weighted Ab Twist #5 2 x 10   MANUAL THERAPY   HS Stretch with contract relax 4 x 30 sec   01/14/22  THEREX   TM at 1.5 mph with BUE support for  5 min  FOTO: 56/100 with target of 60  Lower Trunk Rotation 3 x 10 with 3 sec hold  Prone Push Ups 1 x 10  Standing Hip Abduction 3 x 10  -min VC to decrease distance of swing leg Mini-Squat with extended elbows and flexed shoulders to 90 with #8 Jug of water 2 x 10 Mini-Squat with extended elbows and flexed shoulders to 90 with #5 DB 1 x 10    MANUAL THERAPY    Prone Quad Stretch with PT overpressure  Prone Hip Flexor Stretch with PT overpressure   01/08/22 Nu-Step seat at 7 and arms at 4 for 5 min   Omega Seated Rows #25 1  x 10  Omega Seated Rows #35 1 x 8  Omega Seated Rows #40 1 x 8   Bird Dogs 3 x 8  -min VC to maintain level back to avoid pelvic tilt   Oblique Bicycle Crunch 3 x 8  - min VC for sequence of exercise   Shoulder Horizontal Abduction at 90 deg 3 x 8 with Green band   Seated Rows with Black TB 3 x 10    01/01/22  TM 1.5 mph 5 min  Lower Trunk Rotations 3 x 10  Thoracic Rotation 3 x 10  Mini-Squats 1 x 10  Mini-Squats 2 x 10 with jug of water  AAROM Lumbar Flexion Center, Right, and Left Silver Ball Rolls 3 x 10  Forward Flexion 3 x 30 sec  Seated Rows with Blue TB 3 x 10  Shoulder Horizontal Abduction at 90 degrees with red TB 3 x 10         PATIENT EDUCATION:  Education details: form and technique and explanation of functional outcome measures  Person educated: Patient Education method: Explanation, Handout Education comprehension: verbalized understanding, returned demonstration, and verbal cues required     HOME EXERCISE PROGRAM: Access Code: [email or web address] URL: [email or web address] Date: 01/21/2022 Prepared by: Bradly Chris  Exercises - Supine Lower Trunk Rotation  - 1 x daily - 7 x weekly - 3 sets - 10 reps - 3 hold - Seated Table Hamstring Stretch  - 1 x daily - 7 x weekly - 1 sets - 3 reps - 60 hold - Bound Angle Hands Forward   - 1 x daily - 7 x weekly - 1 sets - 3 reps - 60 hold - Sidelying Open Book Thoracic Lumbar Rotation and Extension  - 1 x daily - 7 x weekly - 3 sets - 10 reps - Mini Squat  - 1 x daily - 3 x weekly - 3 sets - 10 reps - Seated Shoulder Row with Anchored Resistance  - 1 x daily - 3 x weekly - 3 sets - 10 reps - Standing Shoulder Horizontal Abduction with Resistance  - 1 x daily - 3 x weekly - 3 sets - 8 reps - Oblique Bicycle Crunch  - 1 x daily - 3 x weekly - 3 sets - 8 reps - Good Morning with Resistance  - 1 x daily - 3 x weekly - 3 sets - 10 reps - Bird Dog with Knee Taps  - 1 x daily - 3 x weekly - 3 sets - 8 reps -  Sidelying Hip Adduction  -  1 x daily - 3 x weekly - 3 sets - 10 reps   ASSESSMENT:   CLINICAL IMPRESSION:  Pt exhibits improved hip strength from initial eval and he continues to report low back pain scores. He has met most of his goals with the exception of FOTO. He continues to show decreased lumbar flexion which is likely the result of shortened HS limiting lumbar flexion. Progressed parascapular and posterior chain exercises during today's session without increasing patient's pain. He will continue to benefit from PT to address the aforementioned deficits to return to completing standing activities like walking and self-care tasks without needing rest breaks and without feeling severe pain.     OBJECTIVE IMPAIRMENTS Abnormal gait, decreased endurance, decreased mobility, difficulty walking, decreased ROM, decreased strength, impaired flexibility, impaired sensation, and pain.    ACTIVITY LIMITATIONS carrying, lifting, bending, standing, squatting, sleeping, and locomotion level   PARTICIPATION LIMITATIONS: shopping, community activity, yard work, and school   PERSONAL FACTORS Age, Past/current experiences, Time since onset of injury/illness/exacerbation, and 3+ comorbidities: h/o PD, T2DM, and HTN   are also affecting patient's functional outcome.    REHAB POTENTIAL: Good   CLINICAL DECISION MAKING: Evolving/moderate complexity   EVALUATION COMPLEXITY: Moderate     GOALS: Goals reviewed with patient? No   SHORT TERM GOALS: Target date: 01/08/2022   Pt will be independent with HEP in order to improve strength and balance in order to decrease fall risk and improve function at home and work. Baseline: NT  Goal status: Ongoing    2.  Patient will achieve full lumbar flexion as evidence of improved hamstring flexibility and improved spinal mobility to be able to bend down and pickup objects in his environment for self-care tasks  Baseline: 80% Flexion, 85% Flexion  Goal status:  Partially me     3.  Pt will increase 6MWT by at least 32m(1672f in order to demonstrate clinically significant improvement in cardiopulmonary endurance and community ambulation Baseline: 1,200 ft  Goal status: Deferred          LONG TERM GOALS: Target date: 02/19/2022   Patient will have improved function and activity level as evidenced by an increase in FOTO score by 10 points or more.  Baseline: 52/100 Target 60  01/14/22: 56 Goal status: ONGOING    2.  Patient will improve hip strength by 1/2 grade MMT to offload surrounding structures of spine in order to improve symptoms.  Baseline: Hip Ext R/L 4/4, Hip Abd R/L 4-/4-, Hip Add R/L 4-/4- 01/21/22: Hip Ext R/L 4+/4+, Hip Abd R/L 5/5, Hip Add R/L 4+/4+ Goal status: ACHIEVED    3.  Patient will improve aerobic endurance on [id number] 1,000 ft in order to show community ambulator status and improved low back pain to tolerate upright activity.   Baseline: 1,200 ft  Goal status: Deferred    4. Patient will demonstrate improved core strength as evidenced by maintaining 40 mmHg pressure during stabilizer biofeedback measurement of TA in supine and with leg loading.  Baseline: Not performed  Goal status: Deferred      PLAN: PT FREQUENCY: 1-2x/week   PT DURATION: 8 weeks   PLANNED INTERVENTIONS: Therapeutic exercises, Therapeutic activity, Gait training, Patient/Family education, Self Care, Joint mobilization, Joint manipulation, Stair training, Vestibular training, Canalith repositioning, Aquatic Therapy, Dry Needling, Electrical stimulation, Spinal manipulation, Spinal mobilization, Cryotherapy, Moist heat, Traction, Manual therapy, and Re-evaluation.   PLAN FOR NEXT SESSION:  SLS exercises for balance and strength. Reassess lumbar flexion with bent knee.  Progress parascapular and  hip strengthening and stretching exercises, rotational and postural exercises.     Bradly Chris PT, DPT  01/21/2022, 1:37 PM

## 2022-01-23 ENCOUNTER — Encounter: Payer: Self-pay | Admitting: Physical Therapy

## 2022-01-23 ENCOUNTER — Ambulatory Visit: Payer: Medicare Other | Admitting: Physical Therapy

## 2022-01-23 DIAGNOSIS — R262 Difficulty in walking, not elsewhere classified: Secondary | ICD-10-CM | POA: Diagnosis not present

## 2022-01-23 DIAGNOSIS — M5459 Other low back pain: Secondary | ICD-10-CM | POA: Diagnosis not present

## 2022-01-23 DIAGNOSIS — R269 Unspecified abnormalities of gait and mobility: Secondary | ICD-10-CM | POA: Diagnosis not present

## 2022-01-23 NOTE — Therapy (Signed)
OUTPATIENT PHYSICAL THERAPY TREATMENT NOTE   Patient Name: Johnathan Arnold. MRN: [id number] DOB:05-23-1946, 76 y.o., male Today's Date: 01/23/2022  PCP: Dr. Derrel Nip  REFERRING PROVIDER: Dr. Derrel Nip   END OF SESSION:   PT End of Session - 01/23/22 1335     Visit Number 8    Number of Visits 16    Date for PT Re-Evaluation 02/18/22    Authorization Type Medicare    PT Start Time 1330    PT Stop Time 2505    PT Time Calculation (min) 45 min    Activity Tolerance Patient tolerated treatment well    Behavior During Therapy [facility] for tasks assessed/performed             Past Medical History:  Diagnosis Date   3-vessel coronary artery disease    s/p  5 vessel CABG   Diabetes mellitus without complication ([place])    History of cardiac catheterization 2011   [facility]   Hyperlipidemia    Hypertension    Hypertriglyceridemia    Parkinson's disease ([place])    Pneumonia 12/28/2020   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 LIMA to LAD, SVG to D1, OM1 and RPDA   CORONARY ARTERY BYPASS GRAFT  03/1998   5 vessel, [facility]   RIGHT HEART CATH N/A 04/04/2019   Procedure: RIGHT HEART CATH;  Surgeon: Wellington Hampshire, MD;  Location: [place] [facility];  Service: Cardiovascular;  Laterality: N/A;   RIGHT/LEFT HEART CATH AND CORONARY ANGIOGRAPHY N/A 02/01/2018   Procedure: RIGHT/LEFT HEART CATH AND CORONARY ANGIOGRAPHY;  Surgeon: Wellington Hampshire, MD;  Location: [place] [facility];  Service: Cardiovascular;  Laterality: N/A;   Patient Active Problem List   Diagnosis Date Noted   Hospital discharge follow-up 05/16/2021   Generalized weakness 05/07/2021   Fall    Head injury    Bradycardia    Anemia, unspecified 02/07/2021   Neutropenia ([place]) 01/29/2021   Thrombocytopenia ([place]) 01/29/2021   Hyponatremia 12/22/2020   Bilateral leg weakness 12/20/2020   Prostate cancer screening  09/08/2020   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[place]) 09/06/2020   Low back pain of over 3 months duration 08/19/2019   Pulmonary hypertension ([place])    Insomnia 12/21/2018   Sleep apnea in adult 12/09/2018   Periodic limb movement disorder 12/09/2018   Pulmonary nodules 05/18/2018   Wears hearing aid in both ears 05/17/2018   Leg pain, bilateral 02/20/2018   Dyspnea    CKD (chronic kidney disease) stage 3, GFR 30-59 ml/min ([place]) 09/21/2017   History of skin cancer in adulthood 08/14/2016   Parkinson's disease ([place]) 08/09/2016   Bilateral carotid artery stenosis 04/02/2015   Vertigo, peripheral 10/17/2014   Benign prostatic hypertrophy with urinary frequency 01/31/2014   Encounter for Medicare annual wellness exam 07/02/2013   Obesity 04/03/2013   Other malaise and fatigue 09/21/2012   Hyperlipidemia    Hypertension    3-vessel coronary artery disease    S/P CABG x 5     REFERRING DIAG: Chronic low back pain   THERAPY DIAG:  Other low back pain  Difficulty in walking, not elsewhere classified  Rationale for Evaluation and Treatment Rehabilitation  PERTINENT HISTORY: Per Dr. Lupita Dawn note on 09/05/21   He feels generally well, is exercising several times per week but limited by back pain, which is chronic  ,  and parkinson's disease.  No lonver checking blood sugars since he no longer meets criteria for DM based on recurrent a1cs  < 5.8 on diet alone .    HTN:     Taking losartan  25 mg and amlodipine  2.5 mg  daily as directed. Walking regularly,  has been advised to be aware of orthostatic symptoms and need to moidfy medications in the future. Reviewed the pathophysiology of PD orthostass and the effect of medications on these symptoms    He reports that he has been having lower back pain for years.  It is non radiating,  it does not include spinal tenderness.  His back pain is aggravated by prolonged standing and by any activities. Pain  is left of center at  L2 level.   Had PT at  their outpatient [address] location  in the past with good results,  but has not been doing the home exercises .     PD:  has good days and bad days energy level .  Affected by periods of low blood pressure reviewed his medication regimen and schedule   PRECAUTIONS: None   SUBJECTIVE: Patient reports increased back pain yesterday after doing hours of yard work cleaning up branches and debris. His back now feels better.   PAIN:  Are you having pain? No   OBJECTIVE:   VITALS: BP 122/49 HR 58 SpO2 100   DIAGNOSTIC FINDINGS:  CLINICAL DATA:  Low back pain, left leg and buttock pain   EXAM: MRI LUMBAR SPINE WITHOUT CONTRAST   TECHNIQUE: Multiplanar, multisequence MR imaging of the lumbar spine was performed. No intravenous contrast was administered.   COMPARISON:  None.   FINDINGS: Motion artifact is present.   Segmentation: Standard.   Alignment:  Trace retrolisthesis at L1-L2, L2-L3, and L3-L4.   Vertebrae: There is chronic compression deformity of L1 with less than 50% loss of height at superior and inferior endplates. Mild posterior osseous retropulsion. Additional degenerative endplate irregularity at several levels. There is no substantial marrow edema. No suspicious osseous lesion.   Conus medullaris and cauda equina: Conus extends to the L1 level. Conus and cauda equina appear normal.   Paraspinal and other soft tissues: Unremarkable.   Disc levels:   T12-L1: Minimal disc bulge. Mild posterior retropulsion of L1. No significant canal or foraminal stenosis.   L1-L2: Disc bulge.  No significant canal or foraminal stenosis.   L2-L3:  Disc bulge.  No significant canal or foraminal stenosis.   L3-L4: Disc bulge. Facet arthropathy with ligamentum flavum infolding. Minor canal stenosis. No significant right foraminal stenosis. Minor left foraminal stenosis.   L4-L5: Disc bulge. Facet arthropathy with ligamentum flavum infolding. Minor  canal stenosis. Slight effacement of the left greater than right subarticular recesses. Minor right greater than left foraminal stenosis.   L5-S1: Very small central disc protrusion. No significant canal or foraminal stenosis.   IMPRESSION: Multilevel degenerative changes as detailed above without high-grade stenosis.     Electronically Signed   By: Macy Mis M.D.   On: 11/15/2020 15:07   PATIENT SURVEYS:  FOTO 52/100   SCREENING FOR RED FLAGS: Bowel or bladder incontinence: No Spinal tumors: No Cauda equina syndrome: No Compression fracture: No Abdominal aneurysm: No   COGNITION:           Overall cognitive status: Within functional limits for tasks assessed  SENSATION: WFL   MUSCLE LENGTH: Hamstrings: Right 60 deg; Left 60 deg Thomas test: Negative bilateral    POSTURE: rounded shoulders and forward head   PALPATION: Left glute and L2 central spinous process    LUMBAR ROM:    Active  A/PROM  eval  Flexion 80%  Extension 100%  Right lateral flexion 100%  Left lateral flexion 100%  Right rotation 100%  Left rotation 100%   (Blank rows = not tested)   LOWER EXTREMITY ROM:          Active  Right 12/25/2021 Left 12/25/2021  Hip flexion 120 120  Hip extension 30 30  Hip abduction 45 45  Hip adduction 30 30  Hip internal rotation 45 45  Hip external rotation 45 45  Knee flexion 135 135  Knee extension 0 0  Ankle dorsiflexion 20 20  Ankle plantarflexion 50 50  Ankle inversion 35 35  Ankle eversion 15 15   (Blank rows = not tested)        LOWER EXTREMITY MMT:     MMT Right eval Left eval  Hip flexion 5 5  Hip extension 4 4  Hip abduction 4- 4-  Hip adduction 4- 4-  Hip internal rotation 5 5  Hip external rotation 5 5  Knee flexion 4+ 4+  Knee extension 5 5  Ankle dorsiflexion 5 5  Ankle plantarflexion 5 5  Ankle inversion      Ankle eversion       (Blank rows = not tested)   LUMBAR SPECIAL TESTS:   FABER test: Negative and FADIR test: Negative    FUNCTIONAL TESTS:  5 times sit to stand: 11.5 sec  30 seconds chair stand test 13 reps -<11 reps for males 75-79 are at increased risk for falls TA Activation with BP Test: Average about 60 mmHg    GAIT: Distance walked: 40 FT  Assistive device utilized: None Level of assistance: Complete Independence Comments: FHRSP/kyphotic posture and decreased stride length        TODAY'S TREATMENT   01/23/22 Nu-Step Level 1 Resistance with arms and seat at 9 for 5 min  Mini-Squat with #8 water jug 1 x 10  Mini-Squat with 2 x #8 DB 2 x 10  Shoulder Horizontal Abduction at 90 deg with Blue TB 1 x 10  D2 Flexion with yellow band 3 x 10  Wall Push Up with Plus 1 x 10  -Pt reports exercise being too easy  Supine Scapular Protraction with #5 DB 3 x 10   01/21/22               TM at 1.5 mph with BUE support for 5 min                              MMT: Hip Ext R/L 4+/4+, Hip Abd R/L 5/5, Hip Add R/L 4+/4+                Bird Dogs with elbow to knee taps 3 x 8                Side Lying Hip Adduction bilateral 3 x 10                Seated Scapular Rows with Gray TB 3 x 10    01/16/22 TM at 1.5 mph with BUE support for 5 min   Educated on use of RPE scale: -Moderate Activity= cannot sing  favorite song=4-6 on RPE scale  -Vigorous Activity-Talk test= cannot say more than a few words out loud= 7-8 on RPE scale               Hip Hinges 1 x 10  -min VC for sequence of exercise and to maintain slight knee bend   Good Mornings with Blue TB 1 x 10  Good Morning with #8 1 gal water jug 1 x 10  Good Morning with 2 x #8 DB 1 x 10  Weighted Ab Twist #5 2 x 10   MANUAL THERAPY   HS Stretch with contract relax 4 x 30 sec   01/14/22  THEREX   TM at 1.5 mph with BUE support for  5 min  FOTO: 56/100 with target of 60  Lower Trunk Rotation 3 x 10 with 3 sec hold  Prone Push Ups 1 x 10  Standing Hip Abduction 3 x 10  -min VC to decrease distance of  swing leg Mini-Squat with extended elbows and flexed shoulders to 90 with #8 Jug of water 2 x 10 Mini-Squat with extended elbows and flexed shoulders to 90 with #5 DB 1 x 10    MANUAL THERAPY    Prone Quad Stretch with PT overpressure  Prone Hip Flexor Stretch with PT overpressure   01/08/22 Nu-Step seat at 7 and arms at 4 for 5 min   Omega Seated Rows #25 1 x 10  Omega Seated Rows #35 1 x 8  Omega Seated Rows #40 1 x 8   Bird Dogs 3 x 8  -min VC to maintain level back to avoid pelvic tilt   Oblique Bicycle Crunch 3 x 8  - min VC for sequence of exercise   Shoulder Horizontal Abduction at 90 deg 3 x 8 with Green band   Seated Rows with Black TB 3 x 10         PATIENT EDUCATION:  Education details: form and technique and explanation of functional outcome measures  Person educated: Patient Education method: Explanation, Handout Education comprehension: verbalized understanding, returned demonstration, and verbal cues required     HOME EXERCISE PROGRAM: Access Code: [email or web address] URL: [email or web address] Date: 01/21/2022 Prepared by: Bradly Chris  Exercises - Supine Lower Trunk Rotation  - 1 x daily - 7 x weekly - 3 sets - 10 reps - 3 hold - Seated Table Hamstring Stretch  - 1 x daily - 7 x weekly - 1 sets - 3 reps - 60 hold - Bound Angle Hands Forward   - 1 x daily - 7 x weekly - 1 sets - 3 reps - 60 hold - Sidelying Open Book Thoracic Lumbar Rotation and Extension  - 1 x daily - 7 x weekly - 3 sets - 10 reps - Mini Squat  - 1 x daily - 3 x weekly - 3 sets - 10 reps - Seated Shoulder Row with Anchored Resistance  - 1 x daily - 3 x weekly - 3 sets - 10 reps - Standing Shoulder Horizontal Abduction with Resistance  - 1 x daily - 3 x weekly - 3 sets - 8 reps - Oblique Bicycle Crunch  - 1 x daily - 3 x weekly - 3 sets - 8 reps - Good Morning with Resistance  - 1 x daily - 3 x weekly - 3 sets - 10 reps - Bird Dog with Knee Taps  - 1 x daily - 3 x weekly - 3  sets - 8 reps - Sidelying  Hip Adduction  - 1 x daily - 3 x weekly - 3 sets - 10 reps   ASSESSMENT:   CLINICAL IMPRESSION: Pt shows improved LE and parascapular strength with ability to increase strengthening exercises at increased resistance and without increasing his low back pain. He will continue to benefit from PT to address the aforementioned deficits to return to completing standing activities like walking and self-care tasks without needing rest breaks and without feeling severe pain.    OBJECTIVE IMPAIRMENTS Abnormal gait, decreased endurance, decreased mobility, difficulty walking, decreased ROM, decreased strength, impaired flexibility, impaired sensation, and pain.    ACTIVITY LIMITATIONS carrying, lifting, bending, standing, squatting, sleeping, and locomotion level   PARTICIPATION LIMITATIONS: shopping, community activity, yard work, and school   PERSONAL FACTORS Age, Past/current experiences, Time since onset of injury/illness/exacerbation, and 3+ comorbidities: h/o PD, T2DM, and HTN   are also affecting patient's functional outcome.    REHAB POTENTIAL: Good   CLINICAL DECISION MAKING: Evolving/moderate complexity   EVALUATION COMPLEXITY: Moderate     GOALS: Goals reviewed with patient? No   SHORT TERM GOALS: Target date: 01/08/2022   Pt will be independent with HEP in order to improve strength and balance in order to decrease fall risk and improve function at home and work. Baseline: NT  Goal status: Ongoing    2.  Patient will achieve full lumbar flexion as evidence of improved hamstring flexibility and improved spinal mobility to be able to bend down and pickup objects in his environment for self-care tasks  Baseline: 80% Flexion, 85% Flexion  Goal status: Partially me     3.  Pt will increase 6MWT by at least 65m(1658f in order to demonstrate clinically significant improvement in cardiopulmonary endurance and community ambulation Baseline: 1,200 ft  Goal  status: Deferred          LONG TERM GOALS: Target date: 02/19/2022   Patient will have improved function and activity level as evidenced by an increase in FOTO score by 10 points or more.  Baseline: 52/100 Target 60  01/14/22: 56 Goal status: ONGOING    2.  Patient will improve hip strength by 1/2 grade MMT to offload surrounding structures of spine in order to improve symptoms.  Baseline: Hip Ext R/L 4/4, Hip Abd R/L 4-/4-, Hip Add R/L 4-/4- 01/21/22: Hip Ext R/L 4+/4+, Hip Abd R/L 5/5, Hip Add R/L 4+/4+ Goal status: ACHIEVED    3.  Patient will improve aerobic endurance on 87m2mto 1,000 ft in order to show community ambulator status and improved low back pain to tolerate upright activity.   Baseline: 1,200 ft  Goal status: Deferred    4. Patient will demonstrate improved core strength as evidenced by maintaining 40 mmHg pressure during stabilizer biofeedback measurement of TA in supine and with leg loading.  Baseline: Not performed  Goal status: Deferred      PLAN: PT FREQUENCY: 1-2x/week   PT DURATION: 8 weeks   PLANNED INTERVENTIONS: Therapeutic exercises, Therapeutic activity, Gait training, Patient/Family education, Self Care, Joint mobilization, Joint manipulation, Stair training, Vestibular training, Canalith repositioning, Aquatic Therapy, Dry Needling, Electrical stimulation, Spinal manipulation, Spinal mobilization, Cryotherapy, Moist heat, Traction, Manual therapy, and Re-evaluation.   PLAN FOR NEXT SESSION:  SLS exercises for balance and strength. Reassess lumbar flexion with bent knee.  Progress parascapular and  hip strengthening and stretching exercises, rotational and postural exercises.     DanBradly Chris, DPT  01/23/2022, 1:36 PM

## 2022-01-25 ENCOUNTER — Other Ambulatory Visit: Payer: Self-pay | Admitting: Neurology

## 2022-01-25 ENCOUNTER — Other Ambulatory Visit: Payer: Self-pay | Admitting: Cardiovascular Disease

## 2022-01-28 ENCOUNTER — Ambulatory Visit: Payer: Medicare Other | Admitting: Physical Therapy

## 2022-01-28 ENCOUNTER — Encounter: Payer: Self-pay | Admitting: Physical Therapy

## 2022-01-28 DIAGNOSIS — M5459 Other low back pain: Secondary | ICD-10-CM

## 2022-01-28 DIAGNOSIS — R262 Difficulty in walking, not elsewhere classified: Secondary | ICD-10-CM | POA: Diagnosis not present

## 2022-01-28 DIAGNOSIS — R269 Unspecified abnormalities of gait and mobility: Secondary | ICD-10-CM | POA: Diagnosis not present

## 2022-01-28 NOTE — Therapy (Signed)
OUTPATIENT PHYSICAL THERAPY TREATMENT NOTE   Patient Name: Johnathan Arnold. MRN: [id number] DOB:04/03/1946, 76 y.o., male Today's Date: 01/28/2022  PCP: Dr. Derrel Nip  REFERRING PROVIDER: Dr. Derrel Nip   END OF SESSION:   PT End of Session - 01/28/22 1335     Visit Number 9    Number of Visits 16    Date for PT Re-Evaluation 02/18/22    Authorization Type Medicare    PT Start Time 1330    PT Stop Time 0160    PT Time Calculation (min) 45 min    Activity Tolerance Patient tolerated treatment well    Behavior During Therapy [facility] for tasks assessed/performed             Past Medical History:  Diagnosis Date   3-vessel coronary artery disease    s/p  5 vessel CABG   Diabetes mellitus without complication ([place])    History of cardiac catheterization 2011   [facility]   Hyperlipidemia    Hypertension    Hypertriglyceridemia    Parkinson's disease ([place])    Pneumonia 12/28/2020   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 LIMA to LAD, SVG to D1, OM1 and RPDA   CORONARY ARTERY BYPASS GRAFT  03/1998   5 vessel, [facility]   RIGHT HEART CATH N/A 04/04/2019   Procedure: RIGHT HEART CATH;  Surgeon: Wellington Hampshire, MD;  Location: [place] [facility];  Service: Cardiovascular;  Laterality: N/A;   RIGHT/LEFT HEART CATH AND CORONARY ANGIOGRAPHY N/A 02/01/2018   Procedure: RIGHT/LEFT HEART CATH AND CORONARY ANGIOGRAPHY;  Surgeon: Wellington Hampshire, MD;  Location: [place] [facility];  Service: Cardiovascular;  Laterality: N/A;   Patient Active Problem List   Diagnosis Date Noted   Hospital discharge follow-up 05/16/2021   Generalized weakness 05/07/2021   Fall    Head injury    Bradycardia    Anemia, unspecified 02/07/2021   Neutropenia ([place]) 01/29/2021   Thrombocytopenia ([place]) 01/29/2021   Hyponatremia 12/22/2020   Bilateral leg weakness 12/20/2020   Prostate cancer screening  09/08/2020   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[place]) 09/06/2020   Low back pain of over 3 months duration 08/19/2019   Pulmonary hypertension ([place])    Insomnia 12/21/2018   Sleep apnea in adult 12/09/2018   Periodic limb movement disorder 12/09/2018   Pulmonary nodules 05/18/2018   Wears hearing aid in both ears 05/17/2018   Leg pain, bilateral 02/20/2018   Dyspnea    CKD (chronic kidney disease) stage 3, GFR 30-59 ml/min ([place]) 09/21/2017   History of skin cancer in adulthood 08/14/2016   Parkinson's disease ([place]) 08/09/2016   Bilateral carotid artery stenosis 04/02/2015   Vertigo, peripheral 10/17/2014   Benign prostatic hypertrophy with urinary frequency 01/31/2014   Encounter for Medicare annual wellness exam 07/02/2013   Obesity 04/03/2013   Other malaise and fatigue 09/21/2012   Hyperlipidemia    Hypertension    3-vessel coronary artery disease    S/P CABG x 5     REFERRING DIAG: Chronic low back pain   THERAPY DIAG:  Other low back pain  Difficulty in walking, not elsewhere classified  Rationale for Evaluation and Treatment Rehabilitation  PERTINENT HISTORY: Per Dr. Lupita Dawn note on 09/05/21   He feels generally well, is exercising several times per week but limited by back pain, which is chronic  ,  and parkinson's disease.  No lonver checking blood sugars since he no longer meets criteria for DM based on recurrent a1cs  < 5.8 on diet alone .    HTN:     Taking losartan  25 mg and amlodipine  2.5 mg  daily as directed. Walking regularly,  has been advised to be aware of orthostatic symptoms and need to moidfy medications in the future. Reviewed the pathophysiology of PD orthostass and the effect of medications on these symptoms    He reports that he has been having lower back pain for years.  It is non radiating,  it does not include spinal tenderness.  His back pain is aggravated by prolonged standing and by any activities. Pain  is left of center at  L2 level.   Had PT at  their outpatient [address] location  in the past with good results,  but has not been doing the home exercises .     PD:  has good days and bad days energy level .  Affected by periods of low blood pressure reviewed his medication regimen and schedule   PRECAUTIONS: None   SUBJECTIVE: Patient states he has increased back pain since last session.   PAIN:  Are you having pain? Yes: NPRS scale: 2-3/10 Pain location: Left sided low back pain  Pain description: Achy  Aggravating factors: Standing  Relieving factors: Sitting down and laying down    OBJECTIVE:   VITALS: BP 122/49 HR 58 SpO2 100   DIAGNOSTIC FINDINGS:  CLINICAL DATA:  Low back pain, left leg and buttock pain   EXAM: MRI LUMBAR SPINE WITHOUT CONTRAST   TECHNIQUE: Multiplanar, multisequence MR imaging of the lumbar spine was performed. No intravenous contrast was administered.   COMPARISON:  None.   FINDINGS: Motion artifact is present.   Segmentation: Standard.   Alignment:  Trace retrolisthesis at L1-L2, L2-L3, and L3-L4.   Vertebrae: There is chronic compression deformity of L1 with less than 50% loss of height at superior and inferior endplates. Mild posterior osseous retropulsion. Additional degenerative endplate irregularity at several levels. There is no substantial marrow edema. No suspicious osseous lesion.   Conus medullaris and cauda equina: Conus extends to the L1 level. Conus and cauda equina appear normal.   Paraspinal and other soft tissues: Unremarkable.   Disc levels:   T12-L1: Minimal disc bulge. Mild posterior retropulsion of L1. No significant canal or foraminal stenosis.   L1-L2: Disc bulge.  No significant canal or foraminal stenosis.   L2-L3:  Disc bulge.  No significant canal or foraminal stenosis.   L3-L4: Disc bulge. Facet arthropathy with ligamentum flavum infolding. Minor canal stenosis. No significant right foraminal stenosis. Minor left  foraminal stenosis.   L4-L5: Disc bulge. Facet arthropathy with ligamentum flavum infolding. Minor canal stenosis. Slight effacement of the left greater than right subarticular recesses. Minor right greater than left foraminal stenosis.   L5-S1: Very small central disc protrusion. No significant canal or foraminal stenosis.   IMPRESSION: Multilevel degenerative changes as detailed above without high-grade stenosis.     Electronically Signed   By: Macy Mis M.D.   On: 11/15/2020 15:07   PATIENT SURVEYS:  FOTO 52/100   SCREENING FOR RED FLAGS: Bowel or bladder incontinence: No Spinal tumors: No Cauda equina syndrome: No Compression fracture: No Abdominal aneurysm: No   COGNITION:           Overall cognitive status: Within functional limits for tasks assessed  SENSATION: WFL   MUSCLE LENGTH: Hamstrings: Right 60 deg; Left 60 deg Thomas test: Negative bilateral    POSTURE: rounded shoulders and forward head   PALPATION: Left glute and L2 central spinous process    LUMBAR ROM:    Active  A/PROM  eval  Flexion 80%  Extension 100%  Right lateral flexion 100%  Left lateral flexion 100%  Right rotation 100%  Left rotation 100%   (Blank rows = not tested)   LOWER EXTREMITY ROM:          Active  Right 12/25/2021 Left 12/25/2021  Hip flexion 120 120  Hip extension 30 30  Hip abduction 45 45  Hip adduction 30 30  Hip internal rotation 45 45  Hip external rotation 45 45  Knee flexion 135 135  Knee extension 0 0  Ankle dorsiflexion 20 20  Ankle plantarflexion 50 50  Ankle inversion 35 35  Ankle eversion 15 15   (Blank rows = not tested)        LOWER EXTREMITY MMT:     MMT Right eval Left eval  Hip flexion 5 5  Hip extension 4 4  Hip abduction 4- 4-  Hip adduction 4- 4-  Hip internal rotation 5 5  Hip external rotation 5 5  Knee flexion 4+ 4+  Knee extension 5 5  Ankle dorsiflexion 5 5  Ankle plantarflexion 5 5   Ankle inversion      Ankle eversion       (Blank rows = not tested)   LUMBAR SPECIAL TESTS:  FABER test: Negative and FADIR test: Negative    FUNCTIONAL TESTS:  5 times sit to stand: 11.5 sec  30 seconds chair stand test 13 reps -<11 reps for males 75-79 are at increased risk for falls TA Activation with BP Test: Average about 60 mmHg    GAIT: Distance walked: 40 FT  Assistive device utilized: None Level of assistance: Complete Independence Comments: FHRSP/kyphotic posture and decreased stride length        TODAY'S TREATMENT   01/28/22 TM 1.5 mph with BUE support- 5 min  Oblique Bicycle Crunch 3 x 10  Scapular Protraction with #5 DB 2 x 10  Bound [place] 2 x 30 sec              Bound Angle Hands Forward Right 2 x 30 sec              Standing Hip Abduction 3 x 10              -min VC for increased upright posture   MANUAL THERAPY   HS Stretch with PT providing overpressure 3 x 60 sec  Hip Abduction with PT providing overpressure 3 x 60 sec   NPS 0/10 post exercise    01/23/22 Nu-Step Level 1 Resistance with arms and seat at 9 for 5 min  Mini-Squat with #8 water jug 1 x 10  Mini-Squat with 2 x #8 DB 2 x 10  Shoulder Horizontal Abduction at 90 deg with Blue TB 1 x 10  D2 Flexion with yellow band 3 x 10  Wall Push Up with Plus 1 x 10  -Pt reports exercise being too easy  Supine Scapular Protraction with #5 DB 3 x 10   01/21/22               TM at 1.5 mph with BUE support for 5 min  MMT: Hip Ext R/L 4+/4+, Hip Abd R/L 5/5, Hip Add R/L 4+/4+                Bird Dogs with elbow to knee taps 3 x 8                Side Lying Hip Adduction bilateral 3 x 10                Seated Scapular Rows with Gray TB 3 x 10    01/16/22 TM at 1.5 mph with BUE support for 5 min   Educated on use of RPE scale: -Moderate Activity= cannot sing favorite song=4-6 on RPE scale  -Vigorous Activity-Talk test= cannot say more than a few words  out loud= 7-8 on RPE scale               Hip Hinges 1 x 10  -min VC for sequence of exercise and to maintain slight knee bend   Good Mornings with Blue TB 1 x 10  Good Morning with #8 1 gal water jug 1 x 10  Good Morning with 2 x #8 DB 1 x 10  Weighted Ab Twist #5 2 x 10   MANUAL THERAPY   HS Stretch with contract relax 4 x 30 sec   01/14/22  THEREX   TM at 1.5 mph with BUE support for  5 min  FOTO: 56/100 with target of 60  Lower Trunk Rotation 3 x 10 with 3 sec hold  Prone Push Ups 1 x 10  Standing Hip Abduction 3 x 10  -min VC to decrease distance of swing leg Mini-Squat with extended elbows and flexed shoulders to 90 with #8 Jug of water 2 x 10 Mini-Squat with extended elbows and flexed shoulders to 90 with #5 DB 1 x 10    MANUAL THERAPY    Prone Quad Stretch with PT overpressure  Prone Hip Flexor Stretch with PT overpressure   01/08/22 Nu-Step seat at 7 and arms at 4 for 5 min   Omega Seated Rows #25 1 x 10  Omega Seated Rows #35 1 x 8  Omega Seated Rows #40 1 x 8   Bird Dogs 3 x 8  -min VC to maintain level back to avoid pelvic tilt   Oblique Bicycle Crunch 3 x 8  - min VC for sequence of exercise   Shoulder Horizontal Abduction at 90 deg 3 x 8 with Green band   Seated Rows with Black TB 3 x 10         PATIENT EDUCATION:  Education details: form and technique and explanation of functional outcome measures  Person educated: Patient Education method: Explanation, Handout Education comprehension: verbalized understanding, returned demonstration, and verbal cues required     HOME EXERCISE PROGRAM: Access Code: [email or web address] URL: [email or web address] Date: 01/28/2022 Prepared by: Bradly Chris  Exercises - Supine Lower Trunk Rotation  - 1 x daily - 7 x weekly - 3 sets - 10 reps - 3 hold - Oblique Bicycle Crunch  - 1 x daily - 3 x weekly - 3 sets - 8 reps - Bird Dog with Knee Taps  - 1 x daily - 3 x weekly - 3 sets - 8 reps - Seated Table  Hamstring Stretch  - 1 x daily - 7 x weekly - 1 sets - 3 reps - 60 hold - Bound Angle Hands Forward   - 1 x daily - 7 x weekly - 1 sets - 3 reps -  60 hold - Sidelying Open Book Thoracic Lumbar Rotation and Extension  - 1 x daily - 7 x weekly - 3 sets - 10 reps - Sidelying Hip Adduction  - 1 x daily - 3 x weekly - 3 sets - 10 reps - Mini Squat  - 1 x daily - 3 x weekly - 3 sets - 10 reps - Seated Shoulder Row with Anchored Resistance  - 1 x daily - 3 x weekly - 3 sets - 10 reps - Standing Shoulder Horizontal Abduction with Resistance  - 1 x daily - 3 x weekly - 3 sets - 8 reps - Good Morning with Resistance  - 1 x daily - 3 x weekly - 3 sets - 10 reps - Standing Shoulder Single Arm PNF D2 Flexion with Resistance  - 1 x daily - 3 x weekly - 3 sets - 10 reps - Supine Scapular Protraction in Flexion with Dumbbells  - 1 x daily - 3 x weekly - 3 sets - 15 reps - Standing Hip Abduction with Counter Support  - 1 x daily - 3 x weekly - 3 sets - 10 reps   ASSESSMENT:   CLINICAL IMPRESSION:  Pt exhibits an improvement in left sided low back pain after completing hip and low back stretches given that he was likely experiencing pain from muscular stiffness from inactivity. He was able to complete all strengthening exercises without an increase in his low back pain. He will continue to benefit from PT to address the aforementioned deficits to return to completing standing activities like walking and self-care tasks without needing rest breaks and without feeling severe pain.     ACTIVITY LIMITATIONS carrying, lifting, bending, standing, squatting, sleeping, and locomotion level   PARTICIPATION LIMITATIONS: shopping, community activity, yard work, and school   PERSONAL FACTORS Age, Past/current experiences, Time since onset of injury/illness/exacerbation, and 3+ comorbidities: h/o PD, T2DM, and HTN   are also affecting patient's functional outcome.    REHAB POTENTIAL: Good   CLINICAL DECISION MAKING:  Evolving/moderate complexity   EVALUATION COMPLEXITY: Moderate     GOALS: Goals reviewed with patient? No   SHORT TERM GOALS: Target date: 01/08/2022   Pt will be independent with HEP in order to improve strength and balance in order to decrease fall risk and improve function at home and work. Baseline: NT  Goal status: Ongoing    2.  Patient will achieve full lumbar flexion as evidence of improved hamstring flexibility and improved spinal mobility to be able to bend down and pickup objects in his environment for self-care tasks  Baseline: 80% Flexion, 85% Flexion  Goal status: Partially me     3.  Pt will increase 6MWT by at least 25m(1650f in order to demonstrate clinically significant improvement in cardiopulmonary endurance and community ambulation Baseline: 1,200 ft  Goal status: Deferred          LONG TERM GOALS: Target date: 02/19/2022   Patient will have improved function and activity level as evidenced by an increase in FOTO score by 10 points or more.  Baseline: 52/100 Target 60  01/14/22: 56 Goal status: ONGOING    2.  Patient will improve hip strength by 1/2 grade MMT to offload surrounding structures of spine in order to improve symptoms.  Baseline: Hip Ext R/L 4/4, Hip Abd R/L 4-/4-, Hip Add R/L 4-/4- 01/21/22: Hip Ext R/L 4+/4+, Hip Abd R/L 5/5, Hip Add R/L 4+/4+ Goal status: ACHIEVED    3.  Patient will  improve aerobic endurance on 44mT to 1,000 ft in order to show community ambulator status and improved low back pain to tolerate upright activity.   Baseline: 1,200 ft  Goal status: Deferred    4. Patient will demonstrate improved core strength as evidenced by maintaining 40 mmHg pressure during stabilizer biofeedback measurement of TA in supine and with leg loading.  Baseline: Not performed  Goal status: Deferred      PLAN: PT FREQUENCY: 1-2x/week   PT DURATION: 8 weeks   PLANNED INTERVENTIONS: Therapeutic exercises, Therapeutic activity, Gait training,  Patient/Family education, Self Care, Joint mobilization, Joint manipulation, Stair training, Vestibular training, Canalith repositioning, Aquatic Therapy, Dry Needling, Electrical stimulation, Spinal manipulation, Spinal mobilization, Cryotherapy, Moist heat, Traction, Manual therapy, and Re-evaluation.   PLAN FOR NEXT SESSION:  Reassess goals. SLS exercises for balance and strength. Reassess lumbar flexion with bent knee.  Progress parascapular and  hip strengthening and stretching exercises, rotational and postural exercises.     DBradly ChrisPT, DPT  01/28/2022, 1:37 PM

## 2022-01-30 ENCOUNTER — Ambulatory Visit: Payer: Medicare Other | Admitting: Physical Therapy

## 2022-02-01 DIAGNOSIS — Z03818 Encounter for observation for suspected exposure to other biological agents ruled out: Secondary | ICD-10-CM | POA: Diagnosis not present

## 2022-02-01 DIAGNOSIS — U071 COVID-19: Secondary | ICD-10-CM | POA: Diagnosis not present

## 2022-02-04 ENCOUNTER — Ambulatory Visit: Payer: Medicare Other | Admitting: Neurology

## 2022-02-04 ENCOUNTER — Encounter: Payer: Medicare Other | Admitting: Physical Therapy

## 2022-02-06 ENCOUNTER — Ambulatory Visit: Payer: Medicare Other | Admitting: Cardiovascular Disease

## 2022-02-06 ENCOUNTER — Encounter: Payer: Medicare Other | Admitting: Physical Therapy

## 2022-02-07 DIAGNOSIS — U071 COVID-19: Secondary | ICD-10-CM | POA: Diagnosis not present

## 2022-02-11 ENCOUNTER — Ambulatory Visit: Payer: Medicare Other | Admitting: Neurology

## 2022-02-24 ENCOUNTER — Other Ambulatory Visit: Payer: Self-pay | Admitting: Cardiovascular Disease

## 2022-02-24 NOTE — Progress Notes (Unsigned)
Cardiology Office Note    Date:  02/26/2022   ID:  Pj Zehner., DOB [birth year], MRN [id number]  PCP:  [name], MD  Cardiologist:  [name], MD  Electrophysiologist:  None   Chief Complaint: Follow-up  History of Present Illness:   Johnathan Fariss. is a 76 y.o. 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  R/LHC in 01/2018 showed occluded native arteries with patent grafts including LIMA to LAD, SVG to diagonal, SVG to OM, and SVG to distal RCA.  RHC showed normal filling pressures, mild pulmonary hypertension, and normal cardiac output.  Previous carotid Doppler showed mild nonobstructive bilateral disease.  He has a history of chronic bilateral exertional leg pain with normal lower extremity arterial Doppler in 2018.  In the setting of moderate to severe pulmonary hypertension, prior VQ scan was low probability for PE.  RHC in 03/2019 showed moderate pulmonary hypertension at 54 over 13 mmHg with a wedge pressure of 17 mmHg, and a PVR 2.22.  Pulmonary hypertension was felt to be of a mixed etiology.  He was admitted to the hospital in 05/2021 with a fall and generalized weakness, felt to be mostly polypharmacy-induced, including benzodiazepines and antihypertensive medications.  MRI of the brain was nonacute.  CT of the chest was negative for PE.  During the admission, he was noted to be bradycardic in the 40s bpm leading carvedilol to be discontinued.  At his last visit in 07/2021, he was without symptoms of angina or decompensation.  He continued to note weakness and some improvement in orthostatic dizziness felt to likely be neurogenic.  Permissive hypertension was recommended leading to the decrease of amlodipine to 2.5 mg, which was subsequently discontinued.  He comes in today accompanied by his  wife.  Overall, he is doing reasonably well from a cardiac perspective and is without symptoms of angina or decompensation.  His baseline tremor is slightly worse following his recent COVID illness.  He also reports an episode a couple months ago where he was standing in his kitchen and his legs became weak causing him to fall to the ground.  He was without syncope and did not hit his head or suffer LOC.  He indicates his legs have a history of becoming weak similar to this.   Labs independently reviewed: 06/2021 - Hgb 10.9, PLT 109, potassium 4.6, BUN 13, serum creatinine 1.55 04/2021 - albumin 3.8, AST/ALT normal, A1c 5.4, TSH normal 01/2021 - TC 122, TG 111, HDL 62, LDL 37  Past Medical History:  Diagnosis Date   3-vessel coronary artery disease    s/p  5 vessel CABG   Diabetes mellitus without complication ([place])    History of cardiac catheterization 2011   [facility]   Hyperlipidemia    Hypertension    Hypertriglyceridemia    Parkinson's disease ([place])    Pneumonia 12/28/2020   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 LIMA to LAD, SVG to D1, OM1 and RPDA   CORONARY ARTERY BYPASS GRAFT  03/1998   5 vessel, [facility]   RIGHT HEART CATH N/A 04/04/2019   Procedure: RIGHT HEART CATH;  Surgeon: Wellington Hampshire, MD;  Location: [place]  CV [facility];  Service: Cardiovascular;  Laterality: N/A;   RIGHT/LEFT HEART CATH AND CORONARY ANGIOGRAPHY N/A 02/01/2018   Procedure: RIGHT/LEFT HEART CATH AND CORONARY ANGIOGRAPHY;  Surgeon: Wellington Hampshire, MD;  Location: [place] [facility];  Service: Cardiovascular;  Laterality: N/A;    Current Medications: Current Meds  Medication Sig   aspirin EC 81 MG tablet Take 1 tablet (81 mg total) by mouth at bedtime.   carbidopa-levodopa (SINEMET CR) 50-200 MG tablet TAKE ONE TABLET BY MOUTH AT BEDTIME   carbidopa-levodopa (SINEMET IR) 25-100 MG tablet  TAKE 2 TABLETS AT 8 IN THE MORNING AND 11AM AND TAKE TWO TABLETS AT 2:00PM AND 1 TABLET AT5PM   clonazePAM (KLONOPIN) 0.5 MG tablet TAKE 1 AND 1/2 TABLET BY MOUTH EVERY NIGHT AT BEDTIME   furosemide (LASIX) 40 MG tablet Take 40 mg by mouth daily.   isosorbide mononitrate (IMDUR) 60 MG 24 hr tablet TAKE ONE TABLET BY MOUTH DAILY   losartan (COZAAR) 25 MG tablet TAKE ONE TABLET BY MOUTH DAILY   melatonin 3 MG TABS tablet Take 3 mg by mouth at bedtime.   Multiple Vitamin (MULTIVITAMIN) tablet Take 1 tablet by mouth daily.   nitroGLYCERIN (NITROSTAT) 0.4 MG SL tablet Place 1 tablet (0.4 mg total) under the tongue every 5 (five) minutes as needed for chest pain.   omeprazole (PRILOSEC) 20 MG capsule TAKE ONE CAPSULE BY MOUTH EVERY MORNING   polyethylene glycol (MIRALAX / GLYCOLAX) packet Take 17 g by mouth daily.    ranolazine (RANEXA) 1000 MG SR tablet TAKE 1 TABLET BY MOUTH TWICE A DAY   rosuvastatin (CRESTOR) 10 MG tablet TAKE ONE TABLET BY MOUTH DAILY   tamsulosin (FLOMAX) 0.4 MG CAPS capsule Take 1 capsule (0.4 mg total) by mouth daily.    Allergies:   Patient has no known allergies.   Social History   Socioeconomic History   Marital status: Married    Spouse name: Not on file   Number of children: 2   Years of education: Not on file   Highest education level: Master's degree (e.g., MA, MS, MEng, MEd, MSW, MBA)  Occupational History   Occupation: retired    Comment: IT work  Tobacco Use   Smoking status: Never    Passive exposure: Yes   Smokeless tobacco: Never  Vaping Use   Vaping Use: Never used  Substance and Sexual Activity   Alcohol use: Yes    Comment: occasional beer   Drug use: No   Sexual activity: Not Currently  Other Topics Concern   Not on file  Social History Narrative   Right Handed    Lives in a two story home    Social Determinants of Health   Financial Resource Strain: Low Risk  (12/13/2021)   Overall Financial Resource Strain (CARDIA)    Difficulty of  Paying Living Expenses: Not hard at all  Food Insecurity: No Food Insecurity (12/13/2021)   Hunger Vital Sign    Worried About Running Out of Food in the Last Year: Never true    [place] in the Last Year: Never true  Transportation Needs: No Transportation Needs (12/13/2021)   PRAPARE - Hydrologist (Medical): No    Lack of Transportation (Non-Medical): No  Physical Activity: Sufficiently Active (12/13/2021)   Exercise Vital Sign    Days of Exercise per Week: 3 days    Minutes of Exercise per Session: 60 min  Stress: No Stress Concern Present (12/13/2021)  [place]    Feeling of Stress : Not at all  Social Connections: Unknown (12/13/2021)   Social Connection and Isolation Panel [NHANES]    Frequency of Communication with Friends and Family: More than three times a week    Frequency of Social Gatherings with Friends and Family: More than three times a week    Attends Religious Services: Not on Advertising copywriter or Organizations: Yes    Attends Music therapist: More than 4 times per year    Marital Status: Married     Family History:  The patient's family history includes Healthy in his son; Heart attack (age of onset: 76) in his father; Heart attack (age of onset: 51) in his mother; Heart disease in his brother and father; Hypertension in his mother.  ROS:   12-point review of systems is negative unless otherwise noted in the HPI.   EKGs/Labs/Other Studies Reviewed:    Studies reviewed were summarized above. The additional studies were reviewed today:  2D echo 01/31/2021:  1. Left ventricular ejection fraction, by estimation, is 60 to 65%. The  left ventricle has normal function. The left ventricle has no regional  wall motion abnormalities. Left ventricular diastolic parameters are  consistent with Grade II diastolic  dysfunction (pseudonormalization).    2. Right ventricular systolic function is low normal. The right  ventricular size is normal. There is moderately elevated pulmonary artery  systolic pressure. The estimated right ventricular systolic pressure is  56.3 mmHg.   3. Left atrial size was mildly dilated.   4. The mitral valve is normal in structure. Mild mitral valve  regurgitation.   5. The aortic valve is tricuspid. Aortic valve regurgitation is not  visualized.   6. The inferior vena cava is dilated in size with >50% respiratory  variability, suggesting right atrial pressure of 8 mmHg. __________  Aortic and iliac artery ultrasound 11/15/2020: Summary:  Abdominal Aorta:  No evidence of an abdominal aortic aneurysm was visualized.  The largest aortic measurement is 1.9 cm.   Stenosis:  Brisk bi/triphasic flow in the common and external iliac arteries, without  significant plaque or stenosis identified. __________  ABIs 11/15/2020: ABI/TBIToday's ABIToday's TBIPrevious ABIPrevious TBI  +-------+-----------+-----------+------------+------------+  Right  1.15       1.00       1.25        1.16          +-------+-----------+-----------+------------+------------+  Left   1.13       0.87       1.29        1.08          +-------+-----------+-----------+------------+------------+   Bilateral ABIs appear essentially unchanged compared to prior study on  02/18/17.     Summary:  Right: Resting right ankle-brachial index is within normal range.  No evidence of significant right lower extremity arterial disease. The  right toe-brachial index is normal.   Left: Resting left ankle-brachial index is within normal range.  No evidence of significant left lower extremity arterial disease. The left  toe-brachial index is normal. __________  RHC 04/04/2019: Successful right heart catheterization via the right brachial vein. RA pressure: 10/15 with a mean of 13 mmHg. RV: 56/4 with an end-diastolic pressure of 16  mmHg PA: 54/13 with a mean of 29 mmHg.  Pulmonary vascular resistance is 2.22 Woods units Pulmonary capillary wedge pressure is 17 mmHg with prominent V waves. Cardiac output  is 5.4 L/min with a cardiac index of 2.79.   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  He does have prominent V waves suggestive of significant mitral regurgitation.  I did review his recent echocardiogram done in September and his moderate regurgitation was moderate at most. His volume status appears to be reasonable and I am hesitant to diurese him further given underlying chronic kidney disease. __________  2D echo 02/23/2019: 1. The left ventricle has normal systolic function with an ejection  fraction of 60-65%. The cavity size was normal. Left ventricular diastolic  Doppler parameters are consistent with pseudonormalization.   2. The right ventricle has normal systolic function. The cavity was  moderately enlarged. There is Right vetricular wall thickness was not  assessed. Right ventricular systolic pressure is severely elevated with an  estimated pressure of 61 mmHg.   3. Left atrial size was mild-moderately dilated.   4. Right atrial size was mildly dilated.   5. No evidence of mitral valve stenosis.   6. Pulmonic valve regurgitation is mild by color flow Doppler.   7. The aorta is normal unless otherwise noted.   8. The inferior vena cava was is dilated in size with <50% respiratory  variability, suggesting right atrial pressure of 15 mmHg. __________  [facility] 02/01/2018: Prox Cx lesion is 100% stenosed. Prox LAD lesion is 100% stenosed. Prox RCA to Mid RCA lesion is 90% stenosed. SVG and is normal in caliber. The graft exhibits no disease. SVG and is normal in caliber. The graft exhibits no disease. Ost 2nd Diag lesion is 100% stenosed. SVG and is normal in  caliber. The graft exhibits no disease. Dist RCA lesion is 100% stenosed. LIMA graft was visualized by angiography and is normal in caliber. The graft exhibits no disease. Mid LAD lesion is 60% stenosed. Ost RPDA lesion is 60% stenosed.   1.  Occluded native coronary arteries with patent grafts including LIMA to LAD, SVG to diagonal, SVG to OM and SVG to distal RCA.  No significant graft disease.  Suspect proximal artery ischemia given occlusion of native vessels. 2.  Left ventricular angiography was not performed due to chronic kidney disease. 3.  Right heart catheterization showed normal filling pressures, mild pulmonary hypertension and normal cardiac output.  Mean PA pressure was 23 mmHg, pulmonary capillary wedge pressure was 9 mmHg and cardiac output was 7.7 L/min.   Recommendations: Continue medical therapy. __________  2D echo 03/19/2017: - Left ventricle: The cavity size was normal. There was mild    concentric hypertrophy. Systolic function was normal. The    estimated ejection fraction was in the range of 60% to 65%. Wall    motion was normal; there were no regional wall motion    abnormalities. Left ventricular diastolic function parameters    were normal.  - Mitral valve: There was mild regurgitation.  - Left atrium: The atrium was mildly dilated.  - Pulmonary arteries: Systolic pressure was mildly increased. PA    peak pressure: 45 mm Hg (S). __________  [facility] 03/17/2017: There is a small in size, mild in severity, reversible defect involving the mid anteroseptal segment consistent with mild ischemia. There is a small in size, severe, fixed defect at the apex that most likely represents artifact (attenuation and apical thinning) and less likely scar. The left ventricular ejection fraction is normal (60%). Patient was unable to reach target  heart rate with exercise; the study was converted to [place]. Upsloping ST segment depression ST segment depression of 1 mm  was noted during stress in the I, II, V5 and V6 leads after administration of Lexiscan. __________  See Epic for remaining images.    EKG:  EKG is ordered today.  The EKG ordered today demonstrates sinus bradycardia, 55 bpm, prior inferior infarct, possible anterior infarct versus lead placement, nonspecific ST-T changes  Recent Labs: 05/07/2021: TSH 2.674 05/08/2021: ALT 19 07/06/2021: BUN 13; Creatinine, Ser 1.55; Hemoglobin 10.9; Platelets 109; Potassium 4.6; Sodium 136  Recent Lipid Panel    Component Value Date/Time   CHOL 122 01/24/2021 0827   TRIG 111.0 01/24/2021 0827   HDL 62.80 01/24/2021 0827   CHOLHDL 2 01/24/2021 0827   VLDL 22.2 01/24/2021 0827   LDLCALC 37 01/24/2021 0827   LDLDIRECT 38.0 08/06/2015 1118    PHYSICAL EXAM:    VS:  BP 130/70 (BP Location: Left Arm, Patient Position: Sitting, Cuff Size: Normal)   Pulse (!) 55   Ht '5\' 6"'$  (1.676 m)   Wt 176 lb (79.8 kg)   SpO2 99%   BMI 28.41 kg/m   BMI: Body mass index is 28.41 kg/m.  Physical Exam Vitals reviewed.  Constitutional:      Appearance: He is well-developed.  HENT:     Head: Normocephalic and atraumatic.  Eyes:     General:        Right eye: No discharge.        Left eye: No discharge.  Neck:     Vascular: No JVD.  Cardiovascular:     Rate and Rhythm: Normal rate and regular rhythm.     Pulses:          Posterior tibial pulses are 2+ on the right side and 2+ on the left side.     Heart sounds: S1 normal and S2 normal. Heart sounds not distant. No midsystolic click and no opening snap. Murmur heard.     Systolic murmur is present with a grade of 1/6 at the upper right sternal border.     No friction rub.  Pulmonary:     Effort: Pulmonary effort is normal. No respiratory distress.     Breath sounds: Normal breath sounds. No decreased breath sounds, wheezing or rales.  Chest:     Chest wall: No tenderness.  Abdominal:     General: There is no distension.  Musculoskeletal:     Cervical  back: Normal range of motion.  Skin:    General: Skin is warm and dry.     Nails: There is no clubbing.  Neurological:     Mental Status: He is alert and oriented to person, place, and time.  Psychiatric:        Speech: Speech normal.        Behavior: Behavior normal.        Thought Content: Thought content normal.        Judgment: Judgment normal.     Wt Readings from Last 3 Encounters:  02/26/22 176 lb (79.8 kg)  12/13/21 173 lb (78.5 kg)  09/05/21 173 lb 12.8 oz (78.8 kg)     ASSESSMENT & PLAN:   CAD status post CABG with stable angina: He is doing reasonably well with stable symptoms on current medical therapy including aspirin, Imdur, and ranolazine.  Moderate pulmonary hypertension: Stable and euvolemic on low-dose furosemide.  HTN: Blood pressure is well controlled in the office today.  Continue permissive hypertension.  HLD: LDL 37 in 01/2021.  He remains on rosuvastatin 10 mg.  Bilateral carotid artery disease: 1 to 39% bilateral ICA stenoses with antegrade flow of the bilateral vertebral arteries, and normal flow hemodynamics within the bilateral subclavian arteries by ultrasound in 2017.  Parkinson's disease with near syncope and generalized lower extremity weakness: Place Zio patch.  Recent echo demonstrated no significant structural abnormalities.  Continue to allow for a degree of permissive hypertension.  Followed by neurology.    Disposition: F/u with [name] or an APP in 6 months.   Medication Adjustments/Labs and Tests Ordered: Current medicines are reviewed at length with the patient today.  Concerns regarding medicines are outlined above. Medication changes, Labs and Tests ordered today are summarized above and listed in the Patient Instructions accessible in Encounters.   Signed, Christell Faith, PA-C 02/26/2022 2:56 PM     [address] [place], [place] [id number] [phone]

## 2022-02-26 ENCOUNTER — Other Ambulatory Visit: Payer: Self-pay | Admitting: Neurology

## 2022-02-26 ENCOUNTER — Ambulatory Visit (INDEPENDENT_AMBULATORY_CARE_PROVIDER_SITE_OTHER): Payer: Medicare Other

## 2022-02-26 ENCOUNTER — Other Ambulatory Visit: Payer: Self-pay | Admitting: Internal Medicine

## 2022-02-26 ENCOUNTER — Encounter: Payer: Self-pay | Admitting: Physician Assistant

## 2022-02-26 ENCOUNTER — Ambulatory Visit: Payer: Medicare Other | Attending: Cardiovascular Disease | Admitting: Physician Assistant

## 2022-02-26 VITALS — BP 130/70 | HR 55 | Ht 66.0 in | Wt 176.0 lb

## 2022-02-26 DIAGNOSIS — I272 Pulmonary hypertension, unspecified: Secondary | ICD-10-CM | POA: Diagnosis not present

## 2022-02-26 DIAGNOSIS — I10 Essential (primary) hypertension: Secondary | ICD-10-CM | POA: Insufficient documentation

## 2022-02-26 DIAGNOSIS — I779 Disorder of arteries and arterioles, unspecified: Secondary | ICD-10-CM | POA: Diagnosis not present

## 2022-02-26 DIAGNOSIS — E785 Hyperlipidemia, unspecified: Secondary | ICD-10-CM | POA: Diagnosis not present

## 2022-02-26 DIAGNOSIS — I25118 Atherosclerotic heart disease of native coronary artery with other forms of angina pectoris: Secondary | ICD-10-CM | POA: Insufficient documentation

## 2022-02-26 DIAGNOSIS — R55 Syncope and collapse: Secondary | ICD-10-CM

## 2022-02-26 DIAGNOSIS — G20 Parkinson's disease: Secondary | ICD-10-CM | POA: Insufficient documentation

## 2022-02-26 NOTE — Patient Instructions (Signed)
Medication Instructions:   Your physician recommends that you continue on your current medications as directed. Please refer to the Current Medication list given to you today.  *If you need a refill on your cardiac medications before your next appointment, please call your pharmacy*   Lab Work:  None Ordered  If you have labs (blood work) drawn today and your tests are completely normal, you will receive your results only by: [place] (if you have MyChart) OR A paper copy in the mail If you have any lab test that is abnormal or we need to change your treatment, we will call you to review the results.   Testing/Procedures:  Your physician has recommended that you wear a Zio monitor.   This monitor is a medical device that records the heart's electrical activity. Doctors most often use these monitors to diagnose arrhythmias. Arrhythmias are problems with the speed or rhythm of the heartbeat. The monitor is a small device applied to your chest. You can wear one while you do your normal daily activities. While wearing this monitor if you have any symptoms to push the button and record what you felt. Once you have worn this monitor for the period of time provider prescribed (Usually 14 days), you will return the monitor device in the postage paid box. Once it is returned they will download the data collected and provide [place] with a report which the provider will then review and we will call you with those results. Important tips:  Avoid showering during the first 24 hours of wearing the monitor. Avoid excessive sweating to help maximize wear time. Do not submerge the device, no hot tubs, and no swimming pools. Keep any lotions or oils away from the patch. After 24 hours you may shower with the patch on. Take brief showers with your back facing the shower head.  Do not remove patch once it has been placed because that will interrupt data and decrease adhesive wear time. Push the button  when you have any symptoms and write down what you were feeling. Once you have completed wearing your monitor, remove and place into box which has postage paid and place in your outgoing mailbox.  If for some reason you have misplaced your box then call our office and we can provide another box and/or mail it off for you.   Follow-Up: At [facility], you and your health needs are our priority.  As part of our continuing mission to provide you with exceptional heart care, we have created designated Provider Care Teams.  These Care Teams include your primary Cardiologist (physician) and Advanced Practice Providers (APPs -  Physician Assistants and Nurse Practitioners) who all work together to provide you with the care you need, when you need it.  We recommend signing up for the patient portal called "MyChart".  Sign up information is provided on this After Visit Summary.  MyChart is used to connect with patients for Virtual Visits (Telemedicine).  Patients are able to view lab/test results, encounter notes, upcoming appointments, etc.  Non-urgent messages can be sent to your provider as well.   To learn more about what you can do with MyChart, go to [email or web address].    Your next appointment:   6 month(s)  The format for your next appointment:   In Person  Provider:   You may see [name], MD or one of the following Advanced Practice Providers on your designated Care Team:   Murray Hodgkins, NP Christell Faith, PA-C  Cadence Kathlen Mody, PA-C Gerrie Nordmann, NP  Important Information About Sugar

## 2022-03-03 DIAGNOSIS — R55 Syncope and collapse: Secondary | ICD-10-CM | POA: Diagnosis not present

## 2022-03-04 DIAGNOSIS — R809 Proteinuria, unspecified: Secondary | ICD-10-CM | POA: Diagnosis not present

## 2022-03-04 DIAGNOSIS — I129 Hypertensive chronic kidney disease with stage 1 through stage 4 chronic kidney disease, or unspecified chronic kidney disease: Secondary | ICD-10-CM | POA: Diagnosis not present

## 2022-03-04 DIAGNOSIS — R600 Localized edema: Secondary | ICD-10-CM | POA: Diagnosis not present

## 2022-03-04 DIAGNOSIS — N1832 Chronic kidney disease, stage 3b: Secondary | ICD-10-CM | POA: Diagnosis not present

## 2022-03-04 DIAGNOSIS — D631 Anemia in chronic kidney disease: Secondary | ICD-10-CM | POA: Diagnosis not present

## 2022-03-04 LAB — BASIC METABOLIC PANEL
BUN: 17 (ref 4–21)
CO2: 30 — AB (ref 13–22)
Chloride: 103 (ref 99–108)
Creatinine: 2 — AB (ref 0.6–1.3)
Glucose: 145
Sodium: 139 (ref 137–147)

## 2022-03-04 LAB — MICROALBUMIN / CREATININE URINE RATIO: Microalb Creat Ratio: 8.4

## 2022-03-04 LAB — CBC AND DIFFERENTIAL
HCT: 31 — AB (ref 41–53)
Hemoglobin: 10.7 — AB (ref 13.5–17.5)
Platelets: 161 10*3/uL (ref 150–400)
WBC: 4.5

## 2022-03-04 LAB — COMPREHENSIVE METABOLIC PANEL
Albumin: 4.2 (ref 3.5–5.0)
Calcium: 8.9 (ref 8.7–10.7)

## 2022-03-04 LAB — PROTEIN / CREATININE RATIO, URINE: Creatinine, Urine: 119

## 2022-03-07 ENCOUNTER — Ambulatory Visit: Payer: Medicare Other | Admitting: Internal Medicine

## 2022-03-17 DIAGNOSIS — Z85828 Personal history of other malignant neoplasm of skin: Secondary | ICD-10-CM | POA: Diagnosis not present

## 2022-03-17 DIAGNOSIS — D2271 Melanocytic nevi of right lower limb, including hip: Secondary | ICD-10-CM | POA: Diagnosis not present

## 2022-03-17 DIAGNOSIS — L570 Actinic keratosis: Secondary | ICD-10-CM | POA: Diagnosis not present

## 2022-03-17 DIAGNOSIS — D2262 Melanocytic nevi of left upper limb, including shoulder: Secondary | ICD-10-CM | POA: Diagnosis not present

## 2022-03-17 DIAGNOSIS — D2261 Melanocytic nevi of right upper limb, including shoulder: Secondary | ICD-10-CM | POA: Diagnosis not present

## 2022-03-18 ENCOUNTER — Ambulatory Visit (INDEPENDENT_AMBULATORY_CARE_PROVIDER_SITE_OTHER): Payer: Medicare Other | Admitting: Internal Medicine

## 2022-03-18 ENCOUNTER — Encounter: Payer: Self-pay | Admitting: Internal Medicine

## 2022-03-18 VITALS — BP 134/54 | HR 63 | Temp 97.8°F | Ht 66.0 in | Wt 174.0 lb

## 2022-03-18 DIAGNOSIS — E1121 Type 2 diabetes mellitus with diabetic nephropathy: Secondary | ICD-10-CM

## 2022-03-18 DIAGNOSIS — F5101 Primary insomnia: Secondary | ICD-10-CM

## 2022-03-18 DIAGNOSIS — Z23 Encounter for immunization: Secondary | ICD-10-CM

## 2022-03-18 DIAGNOSIS — E7800 Pure hypercholesterolemia, unspecified: Secondary | ICD-10-CM | POA: Diagnosis not present

## 2022-03-18 DIAGNOSIS — I25118 Atherosclerotic heart disease of native coronary artery with other forms of angina pectoris: Secondary | ICD-10-CM

## 2022-03-18 DIAGNOSIS — Z8639 Personal history of other endocrine, nutritional and metabolic disease: Secondary | ICD-10-CM

## 2022-03-18 DIAGNOSIS — Z8616 Personal history of COVID-19: Secondary | ICD-10-CM

## 2022-03-18 DIAGNOSIS — N1832 Chronic kidney disease, stage 3b: Secondary | ICD-10-CM | POA: Diagnosis not present

## 2022-03-18 DIAGNOSIS — R5383 Other fatigue: Secondary | ICD-10-CM | POA: Diagnosis not present

## 2022-03-18 DIAGNOSIS — I10 Essential (primary) hypertension: Secondary | ICD-10-CM | POA: Diagnosis not present

## 2022-03-18 LAB — HEPATIC FUNCTION PANEL
ALT: 5 U/L (ref 0–53)
AST: 17 U/L (ref 0–37)
Albumin: 4.3 g/dL (ref 3.5–5.2)
Alkaline Phosphatase: 58 U/L (ref 39–117)
Bilirubin, Direct: 0.2 mg/dL (ref 0.0–0.3)
Total Bilirubin: 0.7 mg/dL (ref 0.2–1.2)
Total Protein: 6.5 g/dL (ref 6.0–8.3)

## 2022-03-18 LAB — LIPID PANEL
Cholesterol: 119 mg/dL (ref 0–200)
HDL: 59.1 mg/dL (ref >39.00)
LDL Cholesterol: 39 mg/dL (ref 0–99)
NonHDL: 60.18
Total CHOL/HDL Ratio: 2
Triglycerides: 108 mg/dL (ref 0.0–149.0)
VLDL: 21.6 mg/dL (ref 0.0–40.0)

## 2022-03-18 LAB — HEMOGLOBIN A1C: Hgb A1c MFr Bld: 5.7 % (ref 4.6–6.5)

## 2022-03-18 LAB — LDL CHOLESTEROL, DIRECT: Direct LDL: 37 mg/dL

## 2022-03-18 NOTE — Assessment & Plan Note (Signed)
His diabetes has been "cured" for over a year,  But he has developed a "sweet tooth".  Rechecking ac today

## 2022-03-18 NOTE — Progress Notes (Signed)
Subjective:  Patient ID: Johnathan Arnold., male    DOB: [birth year]  Age: 76 y.o. MRN: [id number]  CC: The primary encounter diagnosis was Primary hypertension. 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   HPI Johnathan Arnold. presents for follow up on multiple issues  Chief Complaint  Patient presents with   Follow-up    6 month follow up on hypertension, diabetes, hyperlipidemia   1) type 2 DM with CKD.:  seeing nephrology,  recent labs reviewed.   Does not check blood sugars   Lab Results  Component Value Date   HGBA1C 5.4 05/07/2021     2) CKD with anemia: sees Candiss Norse,  cr has improved by sept labs compared to May .  Hgb 10.7   (down from May)  BUN/CR 17/2.0  (improved from May) urine protein/c ratio 0.084 (ormal)    PTH   61   HTN:  BP elevated since losartan dose was reduced and amlodipine was stopped by nephrology.  Last home reading was Sunday and was 134/54   PD:  taking clonazepam by Tat for RLS.  Taking Sinemet.  Had 2 falls out of bed during "animated drams"  struck head on nightstand  ,  has bed rails ordered  Vertigo recent episode occurred last week  while at rest,  with recurrent vomiting of dry heaves.  Occurred 4 or 5 days ago,  xiphoid process still tender  Had a case of covid in late august.   Several in the dinner group   Symptoms were mild,  but prolonged.  His  home test was done and positive.  Johnathan Arnold confirmed it and prescribed an anti viral,  cough suppression .  HAS DECIDED NOT TO GET ANY MORE BOOSTERS DUE TO PROLONGED MALAISE AFTER EACH PREVIOUS VACCINE   Received PT for low back pain,  core strengthening   Outpatient Medications Prior to Visit  Medication Sig Dispense Refill    aspirin EC 81 MG tablet Take 1 tablet (81 mg total) by mouth at bedtime. 30 tablet 11   carbidopa-levodopa (SINEMET CR) 50-200 MG tablet TAKE ONE TABLET BY MOUTH AT BEDTIME 90 tablet 0   carbidopa-levodopa (SINEMET IR) 25-100 MG tablet TAKE 2 TABLETS AT 8 IN THE MORNING AND 11AM AND TAKE TWO TABLETS AT 2:00PM AND 1 TABLET AT5PM 630 tablet 0   clonazePAM (KLONOPIN) 0.5 MG tablet TAKE 1 AND 1/2 TABLET BY MOUTH EVERY NIGHT AT BEDTIME 45 tablet 4   entacapone (COMTAN) 200 MG tablet Take 1 tablet (200 mg total) by mouth 2 (two) times daily. Take with first 2 dosages of carbidopa/levodopa 180 tablet 1   furosemide (LASIX) 40 MG tablet Take 40 mg by mouth daily.     isosorbide mononitrate (IMDUR) 60 MG 24 hr tablet TAKE ONE TABLET BY MOUTH DAILY 90 tablet 0   losartan (COZAAR) 25 MG tablet TAKE ONE TABLET BY MOUTH DAILY 90 tablet 1   melatonin 3 MG TABS tablet Take 3 mg by mouth at bedtime.     Multiple Vitamin (MULTIVITAMIN) tablet Take 1 tablet by mouth daily.     nitroGLYCERIN (NITROSTAT) 0.4 MG SL tablet Place 1 tablet (0.4 mg total) under the tongue every 5 (five) minutes as needed for chest pain. 25 tablet 1  omeprazole (PRILOSEC) 20 MG capsule TAKE ONE CAPSULE BY MOUTH EVERY MORNING 90 capsule 3   polyethylene glycol (MIRALAX / GLYCOLAX) packet Take 17 g by mouth daily.      ranolazine (RANEXA) 1000 MG SR tablet TAKE 1 TABLET BY MOUTH TWICE A DAY 60 tablet 0   rosuvastatin (CRESTOR) 10 MG tablet TAKE ONE TABLET BY MOUTH DAILY 90 tablet 1   tamsulosin (FLOMAX) 0.4 MG CAPS capsule Take 1 capsule (0.4 mg total) by mouth daily. 90 capsule 6   No facility-administered medications prior to visit.    Review of Systems;  Patient denies headache, fevers, malaise, unintentional weight loss, skin rash, eye pain, sinus congestion and sinus pain, sore throat, dysphagia,  hemoptysis , cough, dyspnea, wheezing, chest pain, palpitations, orthopnea, edema, abdominal pain, nausea, melena, diarrhea,  constipation, flank pain, dysuria, hematuria, urinary  Frequency, nocturia, numbness, tingling, seizures,  Focal weakness, Loss of consciousness,  Tremor, insomnia, depression, anxiety, and suicidal ideation.      Objective:  BP (!) 134/54 (BP Location: Left Arm, Patient Position: Sitting, Cuff Size: Normal)   Pulse 63   Temp 97.8 F (36.6 C) (Oral)   Ht '5\' 6"'$  (1.676 m)   Wt 174 lb (78.9 kg)   SpO2 97%   BMI 28.08 kg/m   BP Readings from Last 3 Encounters:  03/18/22 (!) 134/54  02/26/22 130/70  09/05/21 126/80    Wt Readings from Last 3 Encounters:  03/18/22 174 lb (78.9 kg)  02/26/22 176 lb (79.8 kg)  12/13/21 173 lb (78.5 kg)    General appearance: alert, cooperative and appears stated age Ears: normal TM's and external ear canals both ears Throat: lips, mucosa, and tongue normal; teeth and gums normal Neck: no adenopathy, no carotid bruit, supple, symmetrical, trachea midline and thyroid not enlarged, symmetric, no tenderness/mass/nodules Back: symmetric, no curvature. ROM normal. No CVA tenderness. Lungs: clear to auscultation bilaterally ChestL  bilateral gynecomastial   Heart: regular rate and rhythm, S1, S2 normal, no murmur, click, rub or gallop Abdomen: soft, non-tender; bowel sounds normal; no masses,  no organomegaly Pulses: 2+ and symmetric Skin: Skin color, texture, turgor normal. No rashes or lesions Lymph nodes: Cervical, supraclavicular, and axillary nodes normal. Neuro:  awake and interactive with normal mood and affect. Higher cortical functions are normal. Speech is clear without word-finding difficulty or dysarthria. Extraocular movements are intact. Visual fields of both eyes are grossly intact. Sensation to light touch is grossly intact bilaterally of upper and lower extremities. Motor examination shows 4+/5 symmetric hand grip and upper extremity and 5/5 lower extremity strength. There is no pronation or drift. Gait is non-ataxic .  Resting pill rolling  tremor of both hands noted   Lab Results  Component Value Date   HGBA1C 5.4 05/07/2021   HGBA1C 5.3 01/24/2021   HGBA1C 5.8 09/06/2020    Lab Results  Component Value Date   CREATININE 2.0 (A) 03/04/2022   CREATININE 1.55 (H) 07/06/2021   CREATININE 1.74 (H) 05/08/2021    Lab Results  Component Value Date   WBC 4.5 03/04/2022   HGB 10.7 (A) 03/04/2022   HCT 31 (A) 03/04/2022   PLT 161 03/04/2022   GLUCOSE 137 (H) 07/06/2021   CHOL 122 01/24/2021   TRIG 111.0 01/24/2021   HDL 62.80 01/24/2021   LDLDIRECT 38.0 08/06/2015   LDLCALC 37 01/24/2021   ALT 19 05/08/2021   AST 22 05/08/2021   NA 139 03/04/2022   K 4.6 07/06/2021   CL 103 03/04/2022  CREATININE 2.0 (A) 03/04/2022   BUN 17 03/04/2022   CO2 30 (A) 03/04/2022   TSH 2.674 05/07/2021   PSA 1.10 09/06/2020   HGBA1C 5.4 05/07/2021   MICROALBUR <0.7 08/17/2019    DG Chest Portable 1 View  Result Date: 07/06/2021 CLINICAL DATA:  Per Triage: Pt via EMS from home. Pt c/o generalized weakness since this AM. Pt states he is normally able to walk and now he cannot. Pt has a hx of parkinson. Pt is AANDOx4 and slow answer EXAM: PORTABLE CHEST 1 VIEW COMPARISON:  05/07/2021. 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 Cardiac silhouette is normal in size. Normal mediastinal and hilar contours. Clear lungs.  No pleural effusion or pneumothorax. Skeletal structures are grossly intact. IMPRESSION: No acute cardiopulmonary disease. Electronically Signed   By: Lajean Manes M.D.   On: 07/06/2021 16:01    Assessment & Plan:   Problem List Items Addressed This Visit     Hypertension - Primary    Previous attempts at tight control has resulted in dcreased GFR  and increased imbalance resulting in falls.  No change today given recent reading at home of 140/90 .  Reminded tocontinue to  monitor readings at home       Relevant Orders   HgB A1c   Hyperlipidemia   Relevant Orders   Lipid Profile   Direct LDL   CKD  (chronic kidney disease) stage 3, GFR 30-59 ml/min (HCC)    Improved GFR with liberalization of Blood pressure . Last cr was 20 sept 26 by nephrology's labs       Insomnia    MANAGED WITH clonazepam used concurrenty for RLS.       History of COVID-19    He had a mild case in August,  Treated by [facility] clinic.  He has decided to forego any future vaccinations against COVID due to past adverse reactions to the vaccines      History of diabetes mellitus, type II    His diabetes has been "cured" for over a year,  But he has developed a "sweet tooth".  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    Follow-up: No follow-ups on file.   [name], MD

## 2022-03-18 NOTE — Assessment & Plan Note (Signed)
Improved GFR with liberalization of Blood pressure . Last cr was 20 sept 26 by nephrology's labs

## 2022-03-18 NOTE — Assessment & Plan Note (Signed)
He had a mild case in August,  Treated by [facility] clinic.  He has decided to forego any future vaccinations against COVID due to past adverse reactions to the vaccines

## 2022-03-18 NOTE — Assessment & Plan Note (Addendum)
Previous attempts at tight control has resulted in dcreased GFR  and increased imbalance resulting in falls.  No change today given recent reading at home of 140/90 .  Reminded tocontinue to  monitor readings at home

## 2022-03-18 NOTE — Patient Instructions (Addendum)
Check to see if you still have ondansetron which was prescribed Jan 28 by ED for nausea and vomiting    The new goals for optimal blood pressure management are 130/80 to 140/90 .  Please  continue to check your blood pressure at home and send me the readings so I can determine if you need a change in medication

## 2022-03-18 NOTE — Assessment & Plan Note (Signed)
MANAGED WITH clonazepam used concurrenty for RLS.

## 2022-03-21 NOTE — Progress Notes (Unsigned)
Assessment/Plan:   1.  Parkinsons Disease, with worsening of symptoms when he has had systemic illness  -Continue carbidopa/levodopa 25/100 to 2 at 8am, 2 at 11am, 2 at 2 pm, 1 at 5pm  -Continue carbidopa/levodopa 50/200 CR at bedtime.  -We discussed adding entacapone, 200 mg, 1 with first 2 dosages of levodopa.  Discussed risk, benefits, and side effects.  He was agreeable to try it.  -He and I discussed how systemic illness can influence Parkinson's disease.  It does not worsen the disease itself, but can bring out symptoms.   2.  RLS/RBD  -Continue clonazepam 0.5 mg, 1.5 tablets q hs.  Understands addictive postential.  PDMP reviewed  -discussed using bed rails  -melatonin 3 mg at bedtime.   3.  Sleep apnea  -On CPAP.  4.  Pulmonary hypertension  -Follows with cardiology and pulmonary  5  MCI  -pt with neurocog testing in Jan and demonstrated only MCI.  No evidence of a mood disorder.    6.  Pneumonia, possibly aspiration  -Following with pulmonary.  They stated to hold off on MBE given patient has no complaints of dysphagia.  7.  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  -His blood pressure is too high to put him on any medicine if Neurogenic Orthostatic Hypotension contributes.  -Patient on amlodipine, Lasix, losartan, Imdur.  [name] has been working with me to loosen blood pressure control.  He decreased the patient's amlodipine   Subjective:   Johnathan Arnold. was seen today in follow up for Parkinsons disease.  My previous records were reviewed prior to todays visit as well as outside records available to me. pts wife with patient and supplements the hx. last visit, patient was having episodes of weakness that I thought were orthostatic in nature, and thought were due to strict blood pressure  control.  I spoke with his cardiologist after our last visit and he was in agreement.  He saw the patient not long after I saw the patient.  He decreased the amlodipine.  Current prescribed movement disorder medications: Carbidopa/levodopa 25/100, 2/2/2/1 Carbidopa/levodopa 50/200 CR at bedtime  Clonazepam, 0.5 mg, 1.5 tablets nightly     ALLERGIES:  No Known Allergies  CURRENT MEDICATIONS:  Outpatient Encounter Medications as of 03/24/2022  Medication Sig   aspirin EC 81 MG tablet Take 1 tablet (81 mg total) by mouth at bedtime.   carbidopa-levodopa (SINEMET CR) 50-200 MG tablet TAKE ONE TABLET BY MOUTH AT BEDTIME   carbidopa-levodopa (SINEMET IR) 25-100 MG tablet TAKE 2 TABLETS AT 8 IN THE MORNING AND 11AM AND TAKE TWO TABLETS AT 2:00PM AND 1 TABLET AT5PM   clonazePAM (KLONOPIN) 0.5 MG tablet TAKE 1 AND 1/2 TABLET BY MOUTH EVERY NIGHT AT BEDTIME   entacapone (COMTAN) 200 MG tablet Take 1 tablet (200 mg total) by mouth 2 (two) times daily. Take with first 2 dosages of carbidopa/levodopa   furosemide (LASIX) 40 MG tablet Take 40 mg by mouth daily.   isosorbide mononitrate (IMDUR) 60 MG 24 hr tablet TAKE ONE TABLET BY MOUTH DAILY   losartan (COZAAR) 25 MG tablet TAKE ONE TABLET BY MOUTH DAILY   melatonin 3 MG TABS tablet Take 3 mg by mouth at bedtime.   Multiple Vitamin (MULTIVITAMIN) tablet Take 1 tablet by mouth daily.   nitroGLYCERIN (NITROSTAT) 0.4 MG SL tablet  Place 1 tablet (0.4 mg total) under the tongue every 5 (five) minutes as needed for chest pain.   omeprazole (PRILOSEC) 20 MG capsule TAKE ONE CAPSULE BY MOUTH EVERY MORNING   polyethylene glycol (MIRALAX / GLYCOLAX) packet Take 17 g by mouth daily.    ranolazine (RANEXA) 1000 MG SR tablet TAKE 1 TABLET BY MOUTH TWICE A DAY   rosuvastatin (CRESTOR) 10 MG tablet TAKE ONE TABLET BY MOUTH DAILY   tamsulosin (FLOMAX) 0.4 MG CAPS capsule Take 1 capsule (0.4 mg total) by mouth daily.   No facility-administered encounter medications  on file as of 03/24/2022.    Objective:   PHYSICAL EXAMINATION:    VITALS:   There were no vitals filed for this visit.  No data found.     GEN:  The patient appears stated age and is in NAD. HEENT:  Normocephalic, atraumatic.  The mucous membranes are moist. The superficial temporal arteries are without ropiness or tenderness.  CV:  Brady at 44bpm.  Regular Lungs: CTAB  Neurological examination:  Orientation: The patient is alert and oriented x3. Cranial nerves: There is good facial symmetry with facial hypomimia. The speech is fluent and clear. Soft palate rises symmetrically and there is no tongue deviation. Hearing is intact to conversational tone. Sensation: Sensation is intact to light touch throughout Motor: Strength is at least antigravity x4.  Movement examination: Tone: There is nl tone in the ue/le Abnormal movements: there is intermittent RUE rest tremor (same as previous) Coordination:  There is no decremation with RAM's, with any form of RAMS, including alternating supination and pronation of the forearm, hand opening and closing, finger taps, heel taps and toe taps. Gait and Station: The patient has no difficulty arising out of a deep-seated chair without the use of the hands. The patient's stride length is good.  Good arm swing and no dragging of the legs today    Total time spent on today's visit was *** minutes, including both face-to-face time and nonface-to-face time.  Time included that spent on review of records (prior notes available to me/labs/imaging if pertinent), discussing treatment and goals, answering patient's questions and coordinating care.  Cc:  [name], MD

## 2022-03-24 ENCOUNTER — Ambulatory Visit (INDEPENDENT_AMBULATORY_CARE_PROVIDER_SITE_OTHER): Payer: Medicare Other | Admitting: Neurology

## 2022-03-24 ENCOUNTER — Encounter: Payer: Self-pay | Admitting: Neurology

## 2022-03-24 VITALS — BP 127/72 | HR 59 | Ht 66.0 in | Wt 178.0 lb

## 2022-03-24 DIAGNOSIS — G20A1 Parkinson's disease without dyskinesia, without mention of fluctuations: Secondary | ICD-10-CM | POA: Diagnosis not present

## 2022-03-24 DIAGNOSIS — I25118 Atherosclerotic heart disease of native coronary artery with other forms of angina pectoris: Secondary | ICD-10-CM

## 2022-03-24 MED ORDER — CLONAZEPAM 1 MG PO TABS: 1.0000 mg | ORAL_TABLET | Freq: Every day | ORAL | 4 refills | Status: DC

## 2022-03-24 NOTE — Patient Instructions (Addendum)
Increase your clonazepam, 0.5 mg , TWO of those at bedtime until you run out of your current RX and your new RX will be for double the dose so only take ONE of the 1 mg dosage  The physicians and staff at [facility] are committed to providing excellent care. You may receive a survey requesting feedback about your experience at our office. We strive to receive "very good" responses to the survey questions. If you feel that your experience would prevent you from giving the office a "very good " response, please contact our office to try to remedy the situation. We may be reached at [phone]. Thank you for taking the time out of your busy day to complete the survey.  Local and Online Resources for Power over Parkinson's Group  October 2023    LOCAL [place] PARKINSON'S GROUPS   Power over Parkinson's Group:    Power Over Parkinson's Patient Education Group will be Wednesday, October 11th-*Hybrid meting*- in person at [facility] location and via [facility] at 2 pm.   Upcoming [employer] over [employer] Meetings:  2nd Wednesdays of the month at 2 pm:   October 11th, November 8th, December 13th  Contact Amy Marriott at [email or web address] if interested in participating in this group    [place]! Moves [employer] Instructor-Led Classes offering at [employer]!  TUESDAYS and Wednesdays 1-2 pm.   Contact Vonna Kotyk at  [email or web address] or Caron Presume at [place], [email or web address]  Dance for Parkinson 's classes will be on Tuesdays 9:30am-10:30am starting October 3-December 12 with a break the week of November 21st. Located in the December 04 which is in the first floor of the [employer]  ([place].) To register:  [email or web address] or [phone]  Drumming for Parkinson's will be held on 2nd and 4th Mondays at 11:00 am.   Located at the [place]  ([place]  [place] at [email or web address] or [phone]  Through support from the [place] for Parkinson's classes are free for both patients and caregivers.    [employer] Parkinson's Tai Chi Class, Mondays at 11 am.  Call [phone] for details   [place]:  www.[email or web address]  PD Health at Home continues:  Mindfulness Mondays, Wellness Wednesdays, Fitness Fridays   Upcoming Education:    Parkinson's 101:  What you and your family should know.  Wednesday, Oct. 4th 1-2 pm  Expert Briefing:     Parkinson's and the Gut-Brain Connection.  Wednesday, Oct. 11th 1-2 pm  Hallucinations and Delusions in Parkinson's.  Wednesday, Nov. 8th, 1-2 pm  Register for expert briefings (webinars) at 10-11-1987  Please check out their website to sign up for emails and see their full online offerings      [place]:  [email or web address]   Third Thursday Webinars:  On the third Thursday of every month at 12 p.m. ET, join our free live webinars to learn about various aspects of living with Parkinson's disease and our work to speed medical breakthroughs.  Upcoming Webinar:  Monday for Surveyor, mining. ([place]).  Thursday, Oct. 12th at 12 noon  Check out additional information on their website to see their full online offerings    [place]:  [email or web address]  Upcoming Webinar:   Stay tuned  Webinar Series:  Living with Parkinson's Meetup.   Third Thursdays each month,  3 pm  Care Partner Monthly Meetup.  With Robin Searing Phinney.  First Tuesday of each month, 2 pm  Check out additional information to Live Well Today on their website    Parkinson and Movement Disorders (PMD) Alliance:  www.[email or web address].org  [employer]Life Online:   Online Education Events  Sign up for emails, which are sent weekly to give you updates on programming and online offerings    Parkinson's Association of the [place]:  www.[email or web address]  Information on online support groups, education events, and online exercises including Yoga, Parkinson's exercises and more-LOTS of information on links to PD resources and online events  Virtual Support Group through Parkinson's Association of the [place]; next one is scheduled for Wednesday, October 4th at 2 pm.  (These are typically scheduled for the 1st Wednesday of the month at 2 pm).  Visit website for details.   MOVEMENT AND EXERCISE OPPORTUNITIES  PWR! Moves Classes at [place].  Wednesdays 10 and 11 am.   Contact Amy Marriott, PT [email or web address] if interested.  NEW PWR! Moves Class offerings at [employer].  *TUESDAYS* and Wednesdays 1-2 pm.  Contact Vonna Kotyk at  [email or web address] or Caron Presume at [place],  [email or web address]  Parkinson's Wellness Recovery (PWR! Moves)  www.[email or web address]  Info on the PWR! Virtual Experience:  You will have access to our expertise?through self-assessment, guided plans that start with the PD-specific fundamentals, educational content, tips, Q&A with an expert, and a growing Art therapist of PD-specific pre-recorded and live exercise classes of varying types and intensity - both physical and cognitive! If that is not enough, we offer 1:1 wellness consultations (in-person or virtual) to personalize your PWR! Research scientist (medical).   [place] Fridays:   As part of the PD Health @ Home program, this free video series focuses each week on one aspect of fitness designed to support people living with Parkinson's.? These weekly videos highlight the [place] fitness guidelines for people with Parkinson's disease.  ModemGamers.si  Dance for  PD website is offering free, live-stream classes throughout the week, as well as links to [employer] of classes:  [email or web address]  Virtual dance and Pilates for Parkinson's classes: Click on the Community Tab> Parkinson's Movement Initiative Tab.  To register for classes and for more information, visit [email or web address] and click the "community" tab.   [employer] Parkinson's Cycling Classes   [facility]:  Thursdays @ Noon-Live classes at [employer] ([employer] at [email or web address]?or [phone])  [facility]: Virtual Classes Mondays and Thursdays Jeanette Caprice classes Tuesday, Wednesday and Thursday (contact [place] at [email or web address] ?or [phone])  [place]  Varied levels of classes are offered Tuesdays and Thursdays at [employer].   Stretching with Verdis Frederickson weekly class is also offered for people with Parkinson's  To observe a class or for more information, call [phone] or email Hezzie Bump at [email or web address]   ADDITIONAL SUPPORT AND RESOURCES  [employer]:Online Caregiver Education Opportunities:  www.well-springsolutions.org/caregiver-education/caregiver-support-group.  You may also contact Vickki Muff at [email or web address] or [phone].     Well-Spring Navigator:  Just1Navigator program, a?free service to help individuals and families through the journey of determining care for older adults.  The "Navigator" is a [phone], Education officer, museum, who will speak with a prospective client and/or loved ones to provide an assessment of the situation and a set of recommendations for a personalized care plan -- all free of charge, and whether?[employer] offers the needed service or not. If the need is not a service we provide, we are  well-connected with reputable programs in town that we can refer you to.  www.well-springsolutions.org or to speak with the  Navigator, call [phone].

## 2022-03-27 DIAGNOSIS — R55 Syncope and collapse: Secondary | ICD-10-CM | POA: Diagnosis not present

## 2022-04-05 ENCOUNTER — Other Ambulatory Visit: Payer: Self-pay | Admitting: Cardiovascular Disease

## 2022-04-07 ENCOUNTER — Other Ambulatory Visit: Payer: Self-pay | Admitting: Cardiovascular Disease

## 2022-04-23 ENCOUNTER — Other Ambulatory Visit: Payer: Self-pay | Admitting: Neurology

## 2022-04-23 DIAGNOSIS — G20A1 Parkinson's disease without dyskinesia, without mention of fluctuations: Secondary | ICD-10-CM

## 2022-04-25 ENCOUNTER — Other Ambulatory Visit: Payer: Self-pay | Admitting: Neurology

## 2022-04-25 DIAGNOSIS — G20A1 Parkinson's disease without dyskinesia, without mention of fluctuations: Secondary | ICD-10-CM

## 2022-05-12 ENCOUNTER — Other Ambulatory Visit: Payer: Self-pay | Admitting: Internal Medicine

## 2022-05-14 NOTE — Telephone Encounter (Signed)
Looks like medication was discontinued at discharge.

## 2022-05-20 NOTE — Telephone Encounter (Signed)
Error

## 2022-05-29 ENCOUNTER — Ambulatory Visit: Payer: Medicare Other | Admitting: Neurology

## 2022-06-04 ENCOUNTER — Other Ambulatory Visit: Payer: Self-pay | Admitting: Neurology

## 2022-06-04 ENCOUNTER — Other Ambulatory Visit: Payer: Self-pay | Admitting: Cardiovascular Disease

## 2022-07-02 DIAGNOSIS — I129 Hypertensive chronic kidney disease with stage 1 through stage 4 chronic kidney disease, or unspecified chronic kidney disease: Secondary | ICD-10-CM | POA: Diagnosis not present

## 2022-07-02 DIAGNOSIS — R600 Localized edema: Secondary | ICD-10-CM | POA: Diagnosis not present

## 2022-07-02 DIAGNOSIS — D631 Anemia in chronic kidney disease: Secondary | ICD-10-CM | POA: Diagnosis not present

## 2022-07-02 DIAGNOSIS — R809 Proteinuria, unspecified: Secondary | ICD-10-CM | POA: Diagnosis not present

## 2022-07-02 DIAGNOSIS — N1832 Chronic kidney disease, stage 3b: Secondary | ICD-10-CM | POA: Diagnosis not present

## 2022-07-07 DIAGNOSIS — Z961 Presence of intraocular lens: Secondary | ICD-10-CM | POA: Diagnosis not present

## 2022-07-09 DIAGNOSIS — I129 Hypertensive chronic kidney disease with stage 1 through stage 4 chronic kidney disease, or unspecified chronic kidney disease: Secondary | ICD-10-CM | POA: Diagnosis not present

## 2022-07-09 DIAGNOSIS — D631 Anemia in chronic kidney disease: Secondary | ICD-10-CM | POA: Diagnosis not present

## 2022-07-09 DIAGNOSIS — N1832 Chronic kidney disease, stage 3b: Secondary | ICD-10-CM | POA: Diagnosis not present

## 2022-07-09 NOTE — Progress Notes (Deleted)
07/09/22 10:12 AM   [name] Jun 17, 1945 [id number]  Referring provider:  [name], MD [place],  [place] [address]  Urological history 1. BXO/penile adhesions -s/p penile biopsy, cystoscopy and meatal dilation in 2015 -Clobetasol  2. BPH with LU TS -PSA on 09/06/2020 was 1.10.  -I PSS *** - tamsulosin 0.4 mg daily   No chief complaint on file.   HPI: Johnathan Arnold. is a 77 y.o.male with a personal history of BXO/penile adhensions and BPH with obstruction, who presents today for refill of medication.   I PSS ***    Score:  1-7 Mild 8-19 Moderate 20-35 Severe      PMH: Past Medical History:  Diagnosis Date   3-vessel coronary artery disease    s/p  5 vessel CABG   Diabetes mellitus without complication ([place])    History of cardiac catheterization 2011   [facility]   Hyperlipidemia    Hypertension    Hypertriglyceridemia    Parkinson's disease    Pneumonia 12/28/2020   S/P CABG x 5 11-99   Vertigo     Surgical History: Past Surgical History:  Procedure Laterality Date   CARDIAC CATHETERIZATION  05-19-2010   [facility]: Patent grafts. LIMA to LAD, SVG to D1, OM1 and RPDA   CORONARY ARTERY BYPASS GRAFT  03/1998   5 vessel, [facility]   RIGHT HEART CATH N/A 04/04/2019   Procedure: RIGHT HEART CATH;  Surgeon: Wellington Hampshire, MD;  Location: [place] [facility];  Service: Cardiovascular;  Laterality: N/A;   RIGHT/LEFT HEART CATH AND CORONARY ANGIOGRAPHY N/A 02/01/2018   Procedure: RIGHT/LEFT HEART CATH AND CORONARY ANGIOGRAPHY;  Surgeon: Wellington Hampshire, MD;  Location: [place] [facility];  Service: Cardiovascular;  Laterality: N/A;    Home Medications:  Allergies as of 07/10/2022   No Known Allergies      Medication List        Accurate as of July 09, 2022 10:12 AM. If you have any questions, ask your nurse or doctor.          aspirin EC 81 MG tablet Take 1 tablet (81 mg total) by mouth at  bedtime.   carbidopa-levodopa 50-200 MG tablet Commonly known as: SINEMET CR TAKE 1 TABLET BY MOUTH AT BEDTIME   carbidopa-levodopa 25-100 MG tablet Commonly known as: SINEMET IR TAKE TWO TABLETS BY MOUTH AT 8AM; TAKE TWO TABLETS BY MOUTH AT 11AM; TAKE TWO TABLETS BY MOUTH AT 2PM.; TAKE ONE TABLET BY MOUTH AT 5PM.   clonazePAM 1 MG tablet Commonly known as: KlonoPIN Take 1 tablet (1 mg total) by mouth at bedtime.   entacapone 200 MG tablet Commonly known as: COMTAN Take 1 tablet (200 mg total) by mouth 2 (two) times daily. Take with first 2 dosages of carbidopa/levodopa   furosemide 40 MG tablet Commonly known as: LASIX Take 40 mg by mouth daily.   isosorbide mononitrate 60 MG 24 hr tablet Commonly known as: IMDUR TAKE 1 TABLET BY MOUTH DAILY   losartan 25 MG tablet Commonly known as: COZAAR TAKE ONE TABLET BY MOUTH DAILY   melatonin 3 MG Tabs tablet Take 3 mg by mouth at bedtime.   multivitamin tablet Take 1 tablet by mouth daily.   nitroGLYCERIN 0.4 MG SL tablet Commonly known as: NITROSTAT Place 1 tablet (0.4 mg total) under the tongue every 5 (five) minutes as needed for chest pain.   omeprazole 20 MG capsule Commonly known as: PRILOSEC TAKE  ONE CAPSULE BY MOUTH EVERY MORNING   polyethylene glycol 17 g packet Commonly known as: MIRALAX / GLYCOLAX Take 17 g by mouth daily.   ranolazine 1000 MG SR tablet Commonly known as: RANEXA TAKE 1 TABLET BY MOUTH TWICE A DAY   rosuvastatin 10 MG tablet Commonly known as: CRESTOR TAKE 1 TABLET BY MOUTH DAILY   tamsulosin 0.4 MG Caps capsule Commonly known as: FLOMAX Take 1 capsule (0.4 mg total) by mouth daily.        Allergies: No Known Allergies  Family History: Family History  Problem Relation Age of Onset   Heart attack Mother 27   Hypertension Mother    Heart attack Father 39   Heart disease Father    Heart disease Brother    Healthy Son     Social History:  reports that he has never smoked. He  has been exposed to tobacco smoke. He has never used smokeless tobacco. He reports current alcohol use. He reports that he does not use drugs.   Physical Exam: There were no vitals taken for this visit.  Constitutional:  Well nourished. Alert and oriented, No acute distress. HEENT: [place] AT, moist mucus membranes.  Trachea midline Cardiovascular: No clubbing, cyanosis, or edema. Respiratory: Normal respiratory effort, no increased work of breathing. GU: No CVA tenderness.  No bladder fullness or masses.  Patient with circumcised/uncircumcised phallus. ***Foreskin easily retracted***  Urethral meatus is patent.  No penile discharge. No penile lesions or rashes. Scrotum without lesions, cysts, rashes and/or edema.  Testicles are located scrotally bilaterally. No masses are appreciated in the testicles. Left and right epididymis are normal. Rectal: Patient with  normal sphincter tone. Anus and perineum without scarring or rashes. No rectal masses are appreciated. Prostate is approximately *** grams, *** nodules are appreciated. Seminal vesicles are normal. Neurologic: Grossly intact, no focal deficits, moving all 4 extremities. Psychiatric: Normal mood and affect.   Laboratory Data: Serum creatinine (06/2022) 1.79 Lab Results  Component Value Date   HGBA1C 5.7 03/18/2022   Color, Urine YELLOW YELLOW  Appearance Urine CLEAR CLEAR  Specific Gravity, UA 1.001 - 1.035 1.010  pH Urine 5.0 - 8.0 7.0  Glucose, Ur NEGATIVE NEGATIVE  Bilirubin, Urine NEGATIVE NEGATIVE  Ketones, Urine NEGATIVE NEGATIVE  Hemoglobin Ur Ql Strip NEGATIVE NEGATIVE  Protein, Ur NEGATIVE NEGATIVE  Nitrite, Urine NEGATIVE NEGATIVE  WBC Esterase Urine NEGATIVE TRACE Abnormal   WBC, Urine < OR = 5 /HPF NONE SEEN  RBC, Urine < OR = 2 /HPF NONE SEEN  Epithelial Cells in Urine < OR = 5 /HPF NONE SEEN  Trans Epithelial, Urine < OR = 5 /HPF CANCELED  Comment: Result canceled by the ancillary.  Renal Epithelial Cells, Urine <  OR = 3 /HPF CANCELED  Comment: Result canceled by the ancillary.  Bacteria NONE SEEN /HPF NONE SEEN  Calcium Oxalate Crystals, Urine NONE OR FEW /HPF CANCELED  Comment: Result canceled by the ancillary.  Triple Phosphate Crystals, Urine NONE OR FEW /HPF CANCELED  Comment: Result canceled by the ancillary.  Uric Acid Crystals, Urine NONE OR FEW /HPF CANCELED  Comment: Result canceled by the ancillary.  Amorphous Sediments NONE OR FEW /HPF CANCELED  Comment: Result canceled by the ancillary.  Crystals NONE SEEN /HPF CANCELED  Comment: Result canceled by the ancillary.  Hyaline Casts, Urine NONE SEEN /LPF NONE SEEN  Granular Casts, Urine NONE SEEN /LPF CANCELED  Comment: Result canceled by the ancillary.  Casts NONE SEEN /LPF CANCELED  Comment: Result canceled  by the ancillary.  Yeast, UA NONE SEEN /HPF CANCELED  Comment: Result canceled by the ancillary.  Note:    Comment:      This urine was analyzed for the presence of WBC,      RBC, bacteria, casts, and other formed elements.      Only those elements seen were reported.                Resulting Agency  [facility]   Specimen Collected: 07/02/22 09:21   Performed by: Yvetta Coder Last Resulted: 07/03/22 09:58  Received From: [facility]  Result Received: 07/09/22 10:12  I have reviewed the labs.   Assessment & Plan:    1. BXO/penile adhesions - Advise patient to use Clobetasol as needed.  - Continue to self reduce any adhesions and be diligent about hygiene  2. BPH with obstruction  - He is doing well symptomatically  - We have deffered rectal exam based on age - Continue on Flomax. We discussed side effects of dizziness on this medication.  - Prescription for Flomax sent   No follow-ups on file.   Gerado Nabers, [place] [address] [place], [place] [address] [phone]

## 2022-07-10 ENCOUNTER — Ambulatory Visit: Payer: Medicare Other | Admitting: Urology

## 2022-07-10 DIAGNOSIS — N401 Enlarged prostate with lower urinary tract symptoms: Secondary | ICD-10-CM

## 2022-07-10 DIAGNOSIS — N480 Leukoplakia of penis: Secondary | ICD-10-CM

## 2022-07-17 ENCOUNTER — Other Ambulatory Visit: Payer: Self-pay | Admitting: Urology

## 2022-07-17 ENCOUNTER — Other Ambulatory Visit: Payer: Self-pay | Admitting: *Deleted

## 2022-07-17 DIAGNOSIS — N401 Enlarged prostate with lower urinary tract symptoms: Secondary | ICD-10-CM

## 2022-07-17 NOTE — Telephone Encounter (Signed)
Patient missed his 1 yr f/u with Larene Beach, needs to reschedule. Left message to call back

## 2022-07-22 NOTE — Progress Notes (Signed)
07/23/22 1:26 PM   Johnathan Arnold 1946-05-01 [id number]  Referring provider:  [name], MD [place],  [place] [address]  Urological history 1. BXO/penile adhesions -s/p penile biopsy, cystoscopy and meatal dilation in 2015  2. BPH with LU TS -PSA on 09/06/2020 was 1.10.  -I PSS 7/1 - tamsulosin 0.4 mg daily   Chief Complaint  Patient presents with   Follow-up    1 year follow-up   Benign Prostatic Hypertrophy    HPI: Johnathan Arnold. is a 77 y.o.male with a personal history of BXO/penile adhensions and BPH with obstruction, who presents today for refill of medication.   I PSS 7/1   He has no urinary complaints at this time.  Patient denies any modifying or aggravating factors.  Patient denies any gross hematuria, dysuria or suprapubic/flank pain.  Patient denies any fevers, chills, nausea or vomiting.     IPSS     Row Name 07/23/22 1300         International Prostate Symptom Score   How often have you had the sensation of not emptying your bladder? Not at All     How often have you had to urinate less than every two hours? Less than half the time     How often have you found you stopped and started again several times when you urinated? Not at All     How often have you found it difficult to postpone urination? More than half the time     How often have you had a weak urinary stream? Not at All     How often have you had to strain to start urination? Not at All     How many times did you typically get up at night to urinate? 1 Time     Total IPSS Score 7       Quality of Life due to urinary symptoms   If you were to spend the rest of your life with your urinary condition just the way it is now how would you feel about that? Pleased              Score:  1-7 Mild 8-19 Moderate 20-35 Severe    PMH: Past Medical History:  Diagnosis Date   3-vessel coronary artery disease    s/p  5 vessel CABG   Diabetes  mellitus without complication ([place])    History of cardiac catheterization 2011   [facility]   Hyperlipidemia    Hypertension    Hypertriglyceridemia    Parkinson's disease    Pneumonia 12/28/2020   S/P CABG x 5 11-99   Vertigo     Surgical History: Past Surgical History:  Procedure Laterality Date   CARDIAC CATHETERIZATION  05-19-2010   [facility]: Patent grafts. LIMA to LAD, SVG to D1, OM1 and RPDA   CORONARY ARTERY BYPASS GRAFT  03/1998   5 vessel, [facility]   RIGHT HEART CATH N/A 04/04/2019   Procedure: RIGHT HEART CATH;  Surgeon: Wellington Hampshire, MD;  Location: [place] [facility];  Service: Cardiovascular;  Laterality: N/A;   RIGHT/LEFT HEART CATH AND CORONARY ANGIOGRAPHY N/A 02/01/2018   Procedure: RIGHT/LEFT HEART CATH AND CORONARY ANGIOGRAPHY;  Surgeon: Wellington Hampshire, MD;  Location: [place] [facility];  Service: Cardiovascular;  Laterality: N/A;    Home Medications:  Allergies as of 07/23/2022   No Known Allergies      Medication List  Accurate as of July 23, 2022  1:26 PM. If you have any questions, ask your nurse or doctor.          STOP taking these medications    entacapone 200 MG tablet Commonly known as: COMTAN Stopped by: Zara Council, PA-C       TAKE these medications    aspirin EC 81 MG tablet Take 1 tablet (81 mg total) by mouth at bedtime.   carbidopa-levodopa 50-200 MG tablet Commonly known as: SINEMET CR TAKE 1 TABLET BY MOUTH AT BEDTIME   carbidopa-levodopa 25-100 MG tablet Commonly known as: SINEMET IR TAKE TWO TABLETS BY MOUTH AT 8AM; TAKE TWO TABLETS BY MOUTH AT 11AM; TAKE TWO TABLETS BY MOUTH AT 2PM.; TAKE ONE TABLET BY MOUTH AT 5PM.   clonazePAM 1 MG tablet Commonly known as: KlonoPIN Take 1 tablet (1 mg total) by mouth at bedtime.   furosemide 40 MG tablet Commonly known as: LASIX Take 40 mg by mouth daily.   isosorbide mononitrate 60 MG 24 hr tablet Commonly known as: IMDUR TAKE 1 TABLET BY MOUTH DAILY    losartan 25 MG tablet Commonly known as: COZAAR TAKE ONE TABLET BY MOUTH DAILY   melatonin 3 MG Tabs tablet Take 1.5 mg by mouth at bedtime as needed.   multivitamin tablet Take 1 tablet by mouth daily.   nitroGLYCERIN 0.4 MG SL tablet Commonly known as: NITROSTAT Place 1 tablet (0.4 mg total) under the tongue every 5 (five) minutes as needed for chest pain.   omeprazole 20 MG capsule Commonly known as: PRILOSEC TAKE ONE CAPSULE BY MOUTH EVERY MORNING   polyethylene glycol 17 g packet Commonly known as: MIRALAX / GLYCOLAX Take 17 g by mouth daily as needed.   ranolazine 1000 MG SR tablet Commonly known as: RANEXA TAKE 1 TABLET BY MOUTH TWICE A DAY   rosuvastatin 10 MG tablet Commonly known as: CRESTOR TAKE 1 TABLET BY MOUTH DAILY   tamsulosin 0.4 MG Caps capsule Commonly known as: FLOMAX Take 1 capsule (0.4 mg total) by mouth daily.        Allergies: No Known Allergies  Family History: Family History  Problem Relation Age of Onset   Heart attack Mother 53   Hypertension Mother    Heart attack Father 4   Heart disease Father    Heart disease Brother    Healthy Son     Social History:  reports that he has never smoked. He has been exposed to tobacco smoke. He has never used smokeless tobacco. He reports current alcohol use. He reports that he does not use drugs.   Physical Exam: BP (!) 148/72   Pulse (!) 57   Ht 5' 6"$  (1.676 m)   Wt 172 lb (78 kg)   BMI 27.76 kg/m   Constitutional:  Well nourished. Alert and oriented, No acute distress. HEENT: [place] AT, moist mucus membranes.  Trachea midline Cardiovascular: No clubbing, cyanosis, or edema. Respiratory: Normal respiratory effort, no increased work of breathing. GU: No CVA tenderness.  No bladder fullness or masses.  Patient with circumcised phallus. Urethral meatus is patent.  No penile discharge. No penile lesions or rashes.   Atrophic ivory, white, plaques on the glans,  Scrotum without lesions, cysts,  rashes and/or edema. Neurologic: Grossly intact, no focal deficits, moving all 4 extremities. Psychiatric: Normal mood and affect.   Laboratory Data: Serum creatinine (06/2022) 1.79 Lab Results  Component Value Date   HGBA1C 5.7 03/18/2022   Color, Urine YELLOW YELLOW  Appearance Urine CLEAR CLEAR  Specific Gravity, UA 1.001 - 1.035 1.010  pH Urine 5.0 - 8.0 7.0  Glucose, Ur NEGATIVE NEGATIVE  Bilirubin, Urine NEGATIVE NEGATIVE  Ketones, Urine NEGATIVE NEGATIVE  Hemoglobin Ur Ql Strip NEGATIVE NEGATIVE  Protein, Ur NEGATIVE NEGATIVE  Nitrite, Urine NEGATIVE NEGATIVE  WBC Esterase Urine NEGATIVE TRACE Abnormal   WBC, Urine < OR = 5 /HPF NONE SEEN  RBC, Urine < OR = 2 /HPF NONE SEEN  Epithelial Cells in Urine < OR = 5 /HPF NONE SEEN  Trans Epithelial, Urine < OR = 5 /HPF CANCELED  Comment: Result canceled by the ancillary.  Renal Epithelial Cells, Urine < OR = 3 /HPF CANCELED  Comment: Result canceled by the ancillary.  Bacteria NONE SEEN /HPF NONE SEEN  Calcium Oxalate Crystals, Urine NONE OR FEW /HPF CANCELED  Comment: Result canceled by the ancillary.  Triple Phosphate Crystals, Urine NONE OR FEW /HPF CANCELED  Comment: Result canceled by the ancillary.  Uric Acid Crystals, Urine NONE OR FEW /HPF CANCELED  Comment: Result canceled by the ancillary.  Amorphous Sediments NONE OR FEW /HPF CANCELED  Comment: Result canceled by the ancillary.  Crystals NONE SEEN /HPF CANCELED  Comment: Result canceled by the ancillary.  Hyaline Casts, Urine NONE SEEN /LPF NONE SEEN  Granular Casts, Urine NONE SEEN /LPF CANCELED  Comment: Result canceled by the ancillary.  Casts NONE SEEN /LPF CANCELED  Comment: Result canceled by the ancillary.  Yeast, UA NONE SEEN /HPF CANCELED  Comment: Result canceled by the ancillary.  Note:    Comment:      This urine was analyzed for the presence of WBC,      RBC, bacteria, casts, and other formed elements.      Only those elements seen were  reported.                Resulting Agency  [facility]   Specimen Collected: 07/02/22 09:21   Performed by: Yvetta Coder Last Resulted: 07/03/22 09:58  Received From: [facility]  Result Received: 07/09/22 10:12  I have reviewed the labs.   Assessment & Plan:    1. BXO/penile adhesions - Advise patient to use Clobetasol as needed.  - Continue to self reduce any adhesions and be diligent about hygiene  2. BPH with obstruction  - He is doing well symptomatically  - Continue on Flomax. We discussed side effects of dizziness on this medication.   Return for 1 year follow-up with IPSS.   Olia Hinderliter, [place] [address] [place], [place] [address] [phone]

## 2022-07-23 ENCOUNTER — Encounter: Payer: Self-pay | Admitting: Urology

## 2022-07-23 ENCOUNTER — Ambulatory Visit (INDEPENDENT_AMBULATORY_CARE_PROVIDER_SITE_OTHER): Payer: Medicare Other | Admitting: Urology

## 2022-07-23 VITALS — BP 148/72 | HR 57 | Ht 66.0 in | Wt 172.0 lb

## 2022-07-23 DIAGNOSIS — N401 Enlarged prostate with lower urinary tract symptoms: Secondary | ICD-10-CM

## 2022-07-23 DIAGNOSIS — N138 Other obstructive and reflux uropathy: Secondary | ICD-10-CM

## 2022-07-23 DIAGNOSIS — N4889 Other specified disorders of penis: Secondary | ICD-10-CM | POA: Diagnosis not present

## 2022-07-29 ENCOUNTER — Other Ambulatory Visit: Payer: Self-pay | Admitting: Neurology

## 2022-07-29 DIAGNOSIS — G20A1 Parkinson's disease without dyskinesia, without mention of fluctuations: Secondary | ICD-10-CM

## 2022-08-14 ENCOUNTER — Other Ambulatory Visit: Payer: Self-pay | Admitting: Urology

## 2022-08-14 DIAGNOSIS — N401 Enlarged prostate with lower urinary tract symptoms: Secondary | ICD-10-CM

## 2022-08-28 ENCOUNTER — Ambulatory Visit: Payer: Medicare Other | Attending: Cardiovascular Disease | Admitting: Cardiovascular Disease

## 2022-08-28 ENCOUNTER — Encounter: Payer: Self-pay | Admitting: Cardiovascular Disease

## 2022-08-28 VITALS — BP 124/60 | HR 59 | Ht 66.0 in | Wt 177.2 lb

## 2022-08-28 DIAGNOSIS — I779 Disorder of arteries and arterioles, unspecified: Secondary | ICD-10-CM | POA: Diagnosis not present

## 2022-08-28 DIAGNOSIS — I10 Essential (primary) hypertension: Secondary | ICD-10-CM

## 2022-08-28 DIAGNOSIS — I25118 Atherosclerotic heart disease of native coronary artery with other forms of angina pectoris: Secondary | ICD-10-CM | POA: Diagnosis not present

## 2022-08-28 DIAGNOSIS — E785 Hyperlipidemia, unspecified: Secondary | ICD-10-CM | POA: Insufficient documentation

## 2022-08-28 DIAGNOSIS — I272 Pulmonary hypertension, unspecified: Secondary | ICD-10-CM

## 2022-08-28 NOTE — Progress Notes (Signed)
Cardiology Office Note   Date:  08/28/2022   ID:  Aseel Laws., DOB [birth year], MRN [id number]  PCP:  [name], MD  Cardiologist:   [name], MD   Chief Complaint  Patient presents with   Other    6 Month fu no complaints today. Meds reviewed verbally with pt.       History of Present Illness: Johnathan Arnold. is a 77 y.o. male who presents for a followup visit.  He has known history of coronary artery disease status post CABG in 1999. He also has known history of hypertension, chronic kidney disease, hyperlipidemia, Parkinson's disease, sleep apnea on CPAP and type 2 diabetes.  Previous carotid Doppler showed mild nonobstructive bilateral disease.  A right and left cardiac catheterization was done in August 2019  showed occluded native arteries with patent grafts including LIMA to LAD, SVG to diagonal, SVG to OM and SVG to distal RCA.  Right heart catheterization showed normal filling pressures, mild pulmonary hypertension and normal cardiac output. He has chronic bilateral exertional leg pain with normal lower extremity arterial Doppler in 2018.    He has known history of moderate to severe pulmonary hypertension. VQ scan was low probability for pulmonary embolism. Right heart catheterization in October of 2020 showed moderate pulmonary hypertension at 54 /13 mmHg with wedge pressure of 17 mmHg.  Pulmonary vascular resistance was 2.22 Woods units.  Pulmonary hypertension was felt to be of a mixed etiology.   He has underlying sleep apnea but uses CPAP on a regular basis.   He did have bradycardia in the 40s and thus carvedilol was discontinued. He has Parkinson's disease followed by Dr. Carles Collet.   He has known history of orthostatic hypotension and dizziness that improved after stopping some of his antihypertensive medications.  Amlodipine was discontinued.  He was most recently seen by Christell Faith in September and was relatively stable at that time.  He underwent  outpatient monitor due to dizziness which showed mainly sinus rhythm with an average heart rate of 58 bpm and 7 short runs of SVT. He is doing reasonably well with no chest pain or shortness of breath.  Past Medical History:  Diagnosis Date   3-vessel coronary artery disease    s/p  5 vessel CABG   Diabetes mellitus without complication ([place])    History of cardiac catheterization 2011   [facility]   Hyperlipidemia    Hypertension    Hypertriglyceridemia    Parkinson's disease    Pneumonia 12/28/2020   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 LIMA to LAD, SVG to D1, OM1 and RPDA   CORONARY ARTERY BYPASS GRAFT  03/1998   5 vessel, [facility]   RIGHT HEART CATH N/A 04/04/2019   Procedure: RIGHT HEART CATH;  Surgeon: Wellington Hampshire, MD;  Location: [place] [facility];  Service: Cardiovascular;  Laterality: N/A;   RIGHT/LEFT HEART CATH AND CORONARY ANGIOGRAPHY N/A 02/01/2018   Procedure: RIGHT/LEFT HEART CATH AND CORONARY ANGIOGRAPHY;  Surgeon: Wellington Hampshire, MD;  Location: [place] [facility];  Service: Cardiovascular;  Laterality: N/A;     Current Outpatient Medications  Medication Sig Dispense Refill   aspirin EC 81 MG tablet Take 1 tablet (81 mg total) by mouth at bedtime. 30 tablet 11   carbidopa-levodopa (SINEMET CR) 50-200 MG tablet TAKE 1  TABLET BY MOUTH AT BEDTIME 90 tablet 0   carbidopa-levodopa (SINEMET IR) 25-100 MG tablet TAKE TWO TABLETS BY MOUTH AT 8AM; TAKE TWO TABLETS BY MOUTH AT 11AM; TAKE TWO TABLETS BY MOUTH AT 2PM.; TAKE ONE TABLET BY MOUTH AT 5PM. 630 tablet 0   clonazePAM (KLONOPIN) 1 MG tablet Take 1 tablet (1 mg total) by mouth at bedtime. 30 tablet 4   furosemide (LASIX) 40 MG tablet Take 40 mg by mouth daily.     isosorbide mononitrate (IMDUR) 60 MG 24 hr tablet TAKE 1 TABLET BY MOUTH DAILY 90 tablet 1   losartan (COZAAR) 25 MG tablet TAKE ONE TABLET  BY MOUTH DAILY 90 tablet 1   melatonin 3 MG TABS tablet Take 1.5 mg by mouth at bedtime as needed.     Multiple Vitamin (MULTIVITAMIN) tablet Take 1 tablet by mouth daily.     nitroGLYCERIN (NITROSTAT) 0.4 MG SL tablet Place 1 tablet (0.4 mg total) under the tongue every 5 (five) minutes as needed for chest pain. 25 tablet 1   omeprazole (PRILOSEC) 20 MG capsule TAKE ONE CAPSULE BY MOUTH EVERY MORNING 90 capsule 3   polyethylene glycol (MIRALAX / GLYCOLAX) packet Take 17 g by mouth daily as needed.     ranolazine (RANEXA) 1000 MG SR tablet TAKE 1 TABLET BY MOUTH TWICE A DAY 60 tablet 4   rosuvastatin (CRESTOR) 10 MG tablet TAKE 1 TABLET BY MOUTH DAILY 90 tablet 0   tamsulosin (FLOMAX) 0.4 MG CAPS capsule TAKE ONE CAPSULE BY MOUTH DAILY 90 capsule 3   No current facility-administered medications for this visit.    Allergies:   Patient has no known allergies.    Social History:  The patient  reports that he has never smoked. He has been exposed to tobacco smoke. He has never used smokeless tobacco. He reports current alcohol use. He reports that he does not use drugs.   Family History:  The patient's family history includes Healthy in his son; Heart attack (age of onset: 42) in his father; Heart attack (age of onset: 35) in his mother; Heart disease in his brother and father; Hypertension in his mother.    ROS:  Please see the history of present illness.   Otherwise, review of systems are positive for none.   All other systems are reviewed and negative.    PHYSICAL EXAM: VS:  BP 124/60 (BP Location: Left Arm, Patient Position: Sitting, Cuff Size: Normal)   Pulse (!) 59   Ht 5\' 6"  (1.676 m)   Wt 177 lb 4 oz (80.4 kg)   SpO2 98%   BMI 28.61 kg/m  , BMI Body mass index is 28.61 kg/m. GEN: Well nourished, well developed, in no acute distress  HEENT: normal  Neck: no JVD, carotid bruits, or masses Cardiac: RRR; no  rubs, or gallops, 2 out of 6 systolic murmur in the aortic area radiating  to the carotid arteries.  Mild bilateral leg edema. Respiratory:  clear to auscultation bilaterally, normal work of breathing GI: soft, nontender, nondistended, + BS MS: no deformity or atrophy  Skin: warm and dry, no rash Neuro:  Strength and sensation are intact Psych: euthymic mood, full affect   EKG:  EKG is ordered today. Sinus bradycardia with nonspecific T wave changes.  Recent Labs: 03/04/2022: BUN 17; Creatinine 2.0; Hemoglobin 10.7; Platelets 161; Sodium 139 03/18/2022: ALT 5    Lipid Panel    Component Value Date/Time   CHOL 119 03/18/2022 1120   TRIG 108.0  03/18/2022 1120   HDL 59.10 03/18/2022 1120   CHOLHDL 2 03/18/2022 1120   VLDL 21.6 03/18/2022 1120   LDLCALC 39 03/18/2022 1120   LDLDIRECT 37.0 03/18/2022 1120      Wt Readings from Last 3 Encounters:  08/28/22 177 lb 4 oz (80.4 kg)  07/23/22 172 lb (78 kg)  03/24/22 178 lb (80.7 kg)        ASSESSMENT AND PLAN:  1.  Coronary artery disease involving native coronary arteries with stable angina: Most recent cardiac catheterization in 2019 showed patent grafts.  His symptoms are well controlled with Imdur and Ranexa.  2.  Moderate pulmonary hypertension: His symptoms are overall stable and he appears to be euvolemic on current dose of furosemide.   3. Bilateral carotid artery disease:  Mild nonobstructive disease. Continue treatment of risk factors.  4. Hyperlipidemia: I reviewed most recent lipid profile which showed an LDL of 37.  Continue treatment with rosuvastatin.  5. Essential hypertension: His blood pressure is well-controlled on losartan.  Amlodipine was discontinued to allow blood pressure to run higher in the setting of orthostatic dizziness.    Disposition:   FU with me in 6 months  Signed,  [name], MD  08/28/2022 1:27 PM    [facility]

## 2022-08-28 NOTE — Patient Instructions (Signed)
Medication Instructions:  No changes *If you need a refill on your cardiac medications before your next appointment, please call your pharmacy*   Lab Work: None ordered If you have labs (blood work) drawn today and your tests are completely normal, you will receive your results only by: MyChart Message (if you have MyChart) OR A paper copy in the mail If you have any lab test that is abnormal or we need to change your treatment, we will call you to review the results.   Testing/Procedures: None ordered   Follow-Up: At [facility], you and your health needs are our priority.  As part of our continuing mission to provide you with exceptional heart care, we have created designated Provider Care Teams.  These Care Teams include your primary Cardiologist (physician) and Advanced Practice Providers (APPs -  Physician Assistants and Nurse Practitioners) who all work together to provide you with the care you need, when you need it.  We recommend signing up for the patient portal called "MyChart".  Sign up information is provided on this After Visit Summary.  MyChart is used to connect with patients for Virtual Visits (Telemedicine).  Patients are able to view lab/test results, encounter notes, upcoming appointments, etc.  Non-urgent messages can be sent to your provider as well.   To learn more about what you can do with MyChart, go to [email or web address].    Your next appointment:   6 month(s)  Provider:   You may see Muhammad Arida, MD or one of the following Advanced Practice Providers on your designated Care Team:   Christopher Berge, NP Ryan Dunn, PA-C Cadence Furth, PA-C Sheri Hammock, NP    

## 2022-09-01 ENCOUNTER — Other Ambulatory Visit: Payer: Self-pay | Admitting: Neurology

## 2022-09-02 DIAGNOSIS — H903 Sensorineural hearing loss, bilateral: Secondary | ICD-10-CM | POA: Diagnosis not present

## 2022-09-02 DIAGNOSIS — H6123 Impacted cerumen, bilateral: Secondary | ICD-10-CM | POA: Diagnosis not present

## 2022-09-02 DIAGNOSIS — J301 Allergic rhinitis due to pollen: Secondary | ICD-10-CM | POA: Diagnosis not present

## 2022-09-04 ENCOUNTER — Other Ambulatory Visit: Payer: Self-pay | Admitting: Cardiovascular Disease

## 2022-09-05 ENCOUNTER — Other Ambulatory Visit: Payer: Self-pay | Admitting: Neurology

## 2022-09-05 ENCOUNTER — Other Ambulatory Visit: Payer: Self-pay | Admitting: Internal Medicine

## 2022-09-05 ENCOUNTER — Other Ambulatory Visit: Payer: Self-pay | Admitting: Cardiovascular Disease

## 2022-09-08 NOTE — Progress Notes (Unsigned)
Assessment/Plan:   1.  Parkinsons Disease, with worsening of symptoms when he has had systemic illness  -Continue carbidopa/levodopa 25/100 to 2 at 8am, 2 at 11am, 2 at 2 pm, 1 at 5pm  -Continue carbidopa/levodopa 50/200 CR at bedtime.  -proud of him for all the exercise with the RSB  -We discussed that it used to be thought that levodopa would increase risk of melanoma but now it is believed that Parkinsons itself likely increases risk of melanoma. he is to get regular skin checks.  He is doing this.  He sees Dr. Evorn Gong  2.  RLS/RBD  -Continue clonazepam 1 mg q hs.  Understands addictive postential.  PDMP reviewed  -he has bed rails now and uses them  -melatonin 3 mg at bedtime.  3.  Sleep apnea  -On CPAP.  4.  Pulmonary hypertension  -Follows with cardiology and pulmonary  5  MCI  -pt with neurocog testing in Jan, 2022 and demonstrated only MCI.  No evidence of a mood disorder.  I do not suspect that this has gotten worse.  6.  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  -Patient off of amlodipine.  -Patient on Lasix, losartan, Imdur.  [name] has been working with me to loosen blood pressure control and appreciate the care coordination.   Subjective:   [name]. was seen today in follow up for Parkinsons disease.  My previous records were reviewed prior to todays visit as well as outside records available to me. pts wife with patient and supplements the hx. last visit.  Last visit, we did cautiously increase his clonazepam to 1 mg at bed (he was previously 0.75) because of increasing RBD symptoms.  He reports that he is doing okay in that regard.  Patient has had intermittent neurogenic orthostatic hypotension.  Cardiology records from recently are reviewed.  Patient now off of amlodipine per cardiology, but  patient thinks he is still on the medication at reduced dose..  Current prescribed movement disorder medications: Carbidopa/levodopa 25/100, 2/2/2/1 Carbidopa/levodopa 50/200 CR at bedtime  Clonazepam, 1 mg at bed  Prior meds:  entacapone (given but not taken)    ALLERGIES:  No Known Allergies  CURRENT MEDICATIONS:  Outpatient Encounter Medications as of 09/09/2022  Medication Sig   aspirin EC 81 MG tablet Take 1 tablet (81 mg total) by mouth at bedtime.   carbidopa-levodopa (SINEMET CR) 50-200 MG tablet TAKE 1 TABLET BY MOUTH AT BEDTIME   carbidopa-levodopa (SINEMET IR) 25-100 MG tablet TAKE TWO TABLETS BY MOUTH AT 8AM; TAKE TWO TABLETS BY MOUTH AT 11AM; TAKE TWO TABLETS BY MOUTH AT 2PM.; TAKE ONE TABLET BY MOUTH AT 5PM.   clonazePAM (KLONOPIN) 1 MG tablet TAKE 1 TABLET BY MOUTH AT BEDTIME   furosemide (LASIX) 40 MG tablet Take 40 mg by mouth daily.   isosorbide mononitrate (IMDUR) 60 MG 24 hr tablet TAKE 1 TABLET BY MOUTH DAILY   losartan (COZAAR) 25 MG tablet TAKE 1 TABLET BY MOUTH DAILY   melatonin 3 MG TABS tablet Take 1.5 mg by mouth at bedtime as needed.   Multiple Vitamin (MULTIVITAMIN) tablet Take 1 tablet by mouth daily.   nitroGLYCERIN (NITROSTAT) 0.4 MG SL tablet Place 1 tablet (0.4 mg total) under the tongue every 5 (five) minutes as needed for chest pain.   omeprazole (PRILOSEC) 20 MG capsule TAKE  ONE CAPSULE BY MOUTH EVERY MORNING   polyethylene glycol (MIRALAX / GLYCOLAX) packet Take 17 g by mouth daily as needed.   ranolazine (RANEXA) 1000 MG SR tablet TAKE 1 TABLET BY MOUTH TWICE A DAY   rosuvastatin (CRESTOR) 10 MG tablet TAKE 1 TABLET BY MOUTH DAILY   tamsulosin (FLOMAX) 0.4 MG CAPS capsule TAKE ONE CAPSULE BY MOUTH DAILY   [DISCONTINUED] losartan (COZAAR) 25 MG tablet TAKE ONE TABLET BY MOUTH DAILY   No facility-administered encounter medications on file as of 09/09/2022.    Objective:   PHYSICAL EXAMINATION:    VITALS:   Vitals:   09/09/22 1423  BP: 116/78   Pulse: 62  SpO2: 98%  Weight: 177 lb 3.2 oz (80.4 kg)  Height: 5\' 6"  (1.676 m)    No data found.   GEN:  The patient appears stated age and is in NAD. HEENT:  Normocephalic, atraumatic.  The mucous membranes are moist. The superficial temporal arteries are without ropiness or tenderness.  CV:  Loletha Grayer.  regular Lungs: CTAB  Neurological examination:  Orientation: The patient is alert and oriented x3. Cranial nerves: There is good facial symmetry with facial hypomimia. The speech is fluent and clear. Soft palate rises symmetrically and there is no tongue deviation. Hearing is intact to conversational tone. Sensation: Sensation is intact to light touch throughout Motor: Strength is at least antigravity x4.  Movement examination: Tone: There is nl tone in the ue/le Abnormal movements: there is intermittent RUE rest tremor Coordination:  There is no decremation with RAM's, with any form of RAMS, including alternating supination and pronation of the forearm, hand opening and closing, finger taps, heel taps and toe taps. Gait and Station: The patient has no difficulty arising out of a deep-seated chair without the use of the hands. The patient's stride length is good.  Good arm swing and no dragging of the legs today.  This is same as last visit and he is at end of dosage   Cc:  Derrel Nip Aris Everts, MD

## 2022-09-09 ENCOUNTER — Encounter: Payer: Self-pay | Admitting: Neurology

## 2022-09-09 ENCOUNTER — Ambulatory Visit (INDEPENDENT_AMBULATORY_CARE_PROVIDER_SITE_OTHER): Payer: Medicare Other | Admitting: Neurology

## 2022-09-09 VITALS — BP 116/78 | HR 62 | Ht 66.0 in | Wt 177.2 lb

## 2022-09-09 DIAGNOSIS — G903 Multi-system degeneration of the autonomic nervous system: Secondary | ICD-10-CM

## 2022-09-09 DIAGNOSIS — G20A1 Parkinson's disease without dyskinesia, without mention of fluctuations: Secondary | ICD-10-CM | POA: Diagnosis not present

## 2022-09-09 NOTE — Patient Instructions (Signed)
You look great today!  Local and Online Resources for Power over Parkinson's Group  April 2024   LOCAL [place] PARKINSON'S GROUPS   Power over Parkinson's Group:    Power Over Parkinson's Patient Education Group will be Wednesday, April 10th-*Hybrid meting*- in person at [facility] location and via [facility], 2:00-3:00 pm.   Power over [employer] and Care Partner Groups will meet together, with plans for separate break out session for caregivers, depending on topic/speaker Upcoming Power over Parkinson's Meetings/Care Partner Support:  2nd Wednesdays of the month at 2 pm:   April 10th, May 8th [place] at [email or web address] if interested in participating in this group    [place] OFFERINGS  NEW:  Parkinson's Social Game Night.  First Thursday of each month, 2:00-4:00 pm.  *Next date is April 4th*.  [place], [employer].  Contact [email or web address] if interested. Parkinson's CarePartner Group for Men is in the works, if interested email [email or web address] ACT FITNESS Chair Yoga classes "Train and Gain", Fridays 10 am, [employer].  Contact Gina at [phone].  Mining engineer Classes offering at [employer]!  TUESDAYS (Chair Yoga)  and Wednesdays (PWR! Moves)  1:00 pm.   Contact Vonna Kotyk at  [email or web address]  or [phone]  Drumming for Parkinson's will be held on 2nd and 4th Mondays at 11:00 am.   Located at the [place] ([place].)  Contact Doylene Canning at [email or web address] or [phone]  Dance for Parkinson 's classes will be on Tuesdays 10-11 am. Located in the [employer] , in the first floor of the [employer] ([place].) To register:  [email or web address] or [phone] [facility] Class, Mondays at 11 am.  Call  [phone] for details Hamil-Kerr Challenge.  Bike, Run, [employer] for [employer].  Saturday, April 6th at [facility].  To register, visit [email or web address] Moving Day [facility].  Saturday, May 4th, 10 am start.  Register at [employer].[place]:  [email or web address]  PD Health at Home continues:  Mindfulness Mondays, Wellness Wednesdays, Fitness Fridays  (PWR! Moves as part of Fitness Fridays March 22nd, 1-1:45 pm) Upcoming Education:   Parkinson's 101.  Wednesday, April 3rd, 1-2 pm Movement for Parkinson's.  Wednesday, May 1st, 1-2 pm Expert Briefing:  Research Update:  Working to [employer] PD.  Wednesday, April 10th, 1-2 pm Trouble with Zzz's:  Sleep Challenges with Parkinson's.  Wed, May 8th 1-2 pm Register for virtual education and expert briefings (webinars) at [email or web address] Please check out their website to sign up for emails and see their full online offerings     [employer]:  [email or web address]   Third Thursday Webinars:  On the third Thursday of every month at 12 p.m. ET, join our free live webinars to learn about various aspects of living with Parkinson's disease and our work to speed medical breakthroughs.  Upcoming Webinar:  Let's Talk Taboos:  Hard-to-Discuss Parkinson's Symptoms.  Thursday, April 18th at 12 noon. Check out additional information on their website to see their full online offerings    [facility]:  [email or web address]  Upcoming Webinar:   Emergent Therapies.  Wednesday, April 2nd, 4 pm Series:  Living with Parkinson's Meetup.   Third Thursdays each month, 3 pm  Care Partner Monthly Meetup.  With Robin Searing Phinney.  First Tuesday of each month, 2  pm  Check out additional information to Live Well Today on their website    Parkinson and Movement Disorders (PMD) Alliance:  [email or web address]   [employer] Online:  Online Education Events  Sign up for emails, which are sent weekly to give you updates on programming and online offerings    Parkinson's Association of the [place]:  www.[email or web address]  Information on online support groups, education events, and online exercises including Yoga, Parkinson's exercises and more-LOTS of information on links to PD resources and online events  Virtual Support Group through Parkinson's Association of the [place]; next one is scheduled for Wednesday, April 3rd  MOVEMENT AND EXERCISE OPPORTUNITIES  PWR! Moves Classes at [place].  Wednesdays 10 and 11 am.   Contact Amy Marriott, PT [email or web address] if interested.  Parkinson's Exercise Class offerings at [employer]. *TUESDAYS* (Chair yoga) and Wednesdays (PWR! Moves)  1:00 pm.    Contact Vonna Kotyk at [email or web address]    Parkinson's Wellness Recovery (PWR! Moves)  [email or web address]  Info on the PWR! Virtual Experience:  You will have access to our expertise?through self-assessment, guided plans that start with the PD-specific fundamentals, educational content, tips, Q&A with an expert, and a growing Art therapist of PD-specific pre-recorded and live exercise classes of varying types and intensity - both physical and cognitive! If that is not enough, we offer 1:1 wellness consultations (in-person or virtual) to personalize your PWR! Research scientist (medical).   [place] Fridays:   As part of the PD Health @ Home program, this free video series focuses each week on one aspect of fitness designed to support people living with Parkinson's.? These weekly videos highlight the [place] fitness guidelines for people with Parkinson's disease.  ModemG[employer].si  Dance for PD website is offering free, live-stream classes throughout the week, as well as links to [employer] of  classes:  [email or web address]  Virtual dance and Pilates for Parkinson's classes: Click on the Community Tab> Parkinson's Movement Initiative Tab.  To register for classes and for more information, visit [email or web address] and click the "community" tab.   [employer] Parkinson's Cycling Classes   [employer]:  Thursdays @ Noon-Live classes at [employer] ([employer] at [email or web address]?or [phone])  [employer]: Classes Tuesday, Wednesday and Thursday (contact [place] at [email or web address] ?or [phone])  [place]  Varied levels of classes are offered Tuesdays and Thursdays at [employer].   Stretching with Verdis Frederickson weekly class is also offered for people with Parkinson's  To observe a class or for more information, call [phone] or email Hezzie Bump at [email or web address]   ADDITIONAL SUPPORT AND RESOURCES  [employer]:  Online Caregiver Education Opportunities:  www.well-[email or web address]/caregiver-education/caregiver-support-group.  You may also contact Vickki Muff at [email or web address] or [phone].     [employer] April May Decisions Day:  The Most Critical Legal and Medical Decisions to Consider Now!  Tuesday, April 16th, 1-3 pm at [facility] at [facility].  Contact [facility] at [email or web address] or [phone] Powerful Tools for Caregivers.  6 week educational series for caregivers.  April 18-May 23, 10:30 am-12:15 pm at [facility] [employer] [facility] 3rd Floor Conference Room.   Contact 10-11-1993 at [email or web address] or [phone] to register Well-Spring Navigator:  Just1Navigator program, a?free service to help individuals and families through the journey of determining care for older adults.  The "Navigator" is a [facility], 08-09-1989, who will speak with a prospective client and/or  loved ones to provide an assessment of the situation and a set of  recommendations for a personalized care plan -- all free of charge, and whether?[employer] offers the needed service or not. If the need is not a service we provide, we are well-connected with reputable programs in town that we can refer you to.  [email or web address] or to speak with the Navigator, call [phone].

## 2022-09-10 ENCOUNTER — Other Ambulatory Visit: Payer: Self-pay | Admitting: Cardiovascular Disease

## 2022-09-17 ENCOUNTER — Ambulatory Visit (INDEPENDENT_AMBULATORY_CARE_PROVIDER_SITE_OTHER): Payer: Medicare Other | Admitting: Internal Medicine

## 2022-09-17 ENCOUNTER — Encounter: Payer: Self-pay | Admitting: Internal Medicine

## 2022-09-17 VITALS — BP 138/78 | HR 53 | Temp 98.1°F | Ht 66.0 in | Wt 176.2 lb

## 2022-09-17 DIAGNOSIS — R5383 Other fatigue: Secondary | ICD-10-CM

## 2022-09-17 DIAGNOSIS — Z8639 Personal history of other endocrine, nutritional and metabolic disease: Secondary | ICD-10-CM | POA: Diagnosis not present

## 2022-09-17 DIAGNOSIS — M545 Low back pain, unspecified: Secondary | ICD-10-CM | POA: Diagnosis not present

## 2022-09-17 DIAGNOSIS — E7800 Pure hypercholesterolemia, unspecified: Secondary | ICD-10-CM | POA: Diagnosis not present

## 2022-09-17 DIAGNOSIS — G8929 Other chronic pain: Secondary | ICD-10-CM | POA: Diagnosis not present

## 2022-09-17 DIAGNOSIS — G4730 Sleep apnea, unspecified: Secondary | ICD-10-CM | POA: Diagnosis not present

## 2022-09-17 DIAGNOSIS — I10 Essential (primary) hypertension: Secondary | ICD-10-CM | POA: Diagnosis not present

## 2022-09-17 DIAGNOSIS — N1832 Chronic kidney disease, stage 3b: Secondary | ICD-10-CM

## 2022-09-17 DIAGNOSIS — G20A1 Parkinson's disease without dyskinesia, without mention of fluctuations: Secondary | ICD-10-CM

## 2022-09-17 LAB — LIPID PANEL
Cholesterol: 113 mg/dL (ref 0–200)
HDL: 60.1 mg/dL (ref >39.00)
LDL Cholesterol: 36 mg/dL (ref 0–99)
NonHDL: 52.97
Total CHOL/HDL Ratio: 2
Triglycerides: 86 mg/dL (ref 0.0–149.0)
VLDL: 17.2 mg/dL (ref 0.0–40.0)

## 2022-09-17 LAB — CBC WITH DIFFERENTIAL/PLATELET
Basophils Absolute: 0 10*3/uL (ref 0.0–0.1)
Basophils Relative: 0.6 % (ref 0.0–3.0)
Eosinophils Absolute: 0.1 10*3/uL (ref 0.0–0.7)
Eosinophils Relative: 1.5 % (ref 0.0–5.0)
HCT: 32.4 % — ABNORMAL LOW (ref 39.0–52.0)
Hemoglobin: 11.3 g/dL — ABNORMAL LOW (ref 13.0–17.0)
Lymphocytes Relative: 43.7 % (ref 12.0–46.0)
Lymphs Abs: 1.9 10*3/uL (ref 0.7–4.0)
MCHC: 34.8 g/dL (ref 30.0–36.0)
MCV: 99.2 fl (ref 78.0–100.0)
Monocytes Absolute: 0.2 10*3/uL (ref 0.1–1.0)
Monocytes Relative: 4 % (ref 3.0–12.0)
Neutro Abs: 2.1 10*3/uL (ref 1.4–7.7)
Neutrophils Relative %: 50.2 % (ref 43.0–77.0)
Platelets: 151 10*3/uL (ref 150.0–400.0)
RBC: 3.27 Mil/uL — ABNORMAL LOW (ref 4.22–5.81)
RDW: 13 % (ref 11.5–15.5)
WBC: 4.3 10*3/uL (ref 4.0–10.5)

## 2022-09-17 LAB — COMPREHENSIVE METABOLIC PANEL
ALT: 5 U/L (ref 0–53)
AST: 14 U/L (ref 0–37)
Albumin: 4.3 g/dL (ref 3.5–5.2)
Alkaline Phosphatase: 46 U/L (ref 39–117)
BUN: 22 mg/dL (ref 6–23)
CO2: 28 mEq/L (ref 19–32)
Calcium: 9 mg/dL (ref 8.4–10.5)
Chloride: 103 mEq/L (ref 96–112)
Creatinine, Ser: 1.88 mg/dL — ABNORMAL HIGH (ref 0.40–1.50)
GFR: 33.99 mL/min — ABNORMAL LOW (ref >60.00)
Glucose, Bld: 107 mg/dL — ABNORMAL HIGH (ref 70–99)
Potassium: 4.6 mEq/L (ref 3.5–5.1)
Sodium: 140 mEq/L (ref 135–145)
Total Bilirubin: 0.7 mg/dL (ref 0.2–1.2)
Total Protein: 6 g/dL (ref 6.0–8.3)

## 2022-09-17 LAB — HEMOGLOBIN A1C: Hgb A1c MFr Bld: 5.9 % (ref 4.6–6.5)

## 2022-09-17 LAB — TSH: TSH: 2.04 u[IU]/mL (ref 0.35–5.50)

## 2022-09-17 LAB — LDL CHOLESTEROL, DIRECT: Direct LDL: 32 mg/dL

## 2022-09-17 MED ORDER — OMEPRAZOLE 20 MG PO CPDR: 20.0000 mg | DELAYED_RELEASE_CAPSULE | Freq: Every morning | ORAL | 3 refills | Status: DC

## 2022-09-17 NOTE — Patient Instructions (Addendum)
  You are OVERDUE for your tetanus-diptheria-pertussis vaccine   (TDaP)   You can rget it for FREE  at your pharmacy     Exercise: (supported back extension, standing)  Stand against a counter or sofa with your buttocks resting on the edge and your hands on the edge as well on either side of your back  Slowly lean backwards,  Bending from the waist, until you feel slight discomfort. Restore yourself to vertical position (up straight) Repeat the back extension 10 times , each time extending a little farther).  What should you expect? The pain should recede from the calf/thigh/buttocks but may localize to the lower back  If it does not,  Or if it makes the leg pain worse, STOP doing it.   If it results in improvement,  Repeat the exercise every 2-3 hours while awake and STOP THE OTHER EXERCISES that do the opposite motion (back flexion)   If you would like PT referral,  send me a message    You can add up to 2000 mg of acetominophen (tylenol) every day safely  In divided doses (500 mg every 6 hours  Or 1000 mg every 12 hours.)

## 2022-09-17 NOTE — Assessment & Plan Note (Addendum)
Previous attempts at tight control has resulted in dcreased GFR  and increased imbalance resulting in falls.  No change today given recent readings at home  being < 140/90 .  Reminded to continue to  monitor readings at home

## 2022-09-17 NOTE — Progress Notes (Unsigned)
Subjective:  Patient ID: Johnathan Arnold., male    DOB: [birth year]  Age: 77 y.o. MRN: [id number]  CC: The primary encounter diagnosis was Primary hypertension. 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   HPI Johnathan Arnold. presents for  Chief Complaint  Patient presents with   Medical Management of Chronic Issues    6 month follow up    1) low back pain , midline without radiation .  Chronic, aggravated by activity . Relieved with extension exercises.   2) OSA:  on CPAP   using it most of the night every night until 5 am.  Side sleeper   3)  HTN controlled without carvedilol.  Denies orthostasis .  Has not had losartan or hctz today bc of visit home reads <130/80  4) Recent episode of vertigo brought on by position change while walking.  Lasted 2 days . Had nausea with it.  Took diazepam and went to bed.  Declines offer for rx'd  anti emetics. "I'm on enough meds"    5) Parkinson's Disease :  attending [employer] ,at [employer]   and seeing Tat .  Taking sinemet , no recent changes   6) H/o type 2 DM: maintaining healthy weight . No t checking sugars.    Outpatient Medications Prior to Visit  Medication Sig Dispense Refill   aspirin EC 81 MG tablet Take 1 tablet (81 mg total) by mouth at bedtime. 30 tablet 11   carbidopa-levodopa (SINEMET CR) 50-200 MG tablet TAKE 1 TABLET BY MOUTH AT BEDTIME 90 tablet 0   carbidopa-levodopa (SINEMET IR) 25-100 MG tablet TAKE 2 TABLETS BY MOUTH DAILY AT 8 IN THE MORNING, TAKE 2 TABLETS BY MOUTH DAILY AT 11 IN THE MORNING, TAKE 2 TABLETS BY MOUTH DAILY AT 2 IN THE AFTERNOON, TAKE 1 TABLET BY MOUTH DAILY AT 5 IN THE EVENING 630 tablet 0   clonazePAM (KLONOPIN) 1 MG tablet  TAKE 1 TABLET BY MOUTH AT BEDTIME 30 tablet 2   diazepam (VALIUM) 5 MG tablet Take 5 mg by mouth 3 (three) times daily as needed.     furosemide (LASIX) 40 MG tablet Take 40 mg by mouth daily.     isosorbide mononitrate (IMDUR) 60 MG 24 hr tablet TAKE 1 TABLET BY MOUTH DAILY 90 tablet 1   losartan (COZAAR) 25 MG tablet TAKE 1 TABLET BY MOUTH DAILY 90 tablet 1   melatonin 3 MG TABS tablet Take 1.5 mg by mouth at bedtime as needed.     Multiple Vitamin (MULTIVITAMIN) tablet Take 1 tablet by mouth daily.     nitroGLYCERIN (NITROSTAT) 0.4 MG SL tablet Place 1 tablet (0.4 mg total) under the tongue every 5 (five) minutes as needed for chest pain. 25 tablet 1   polyethylene glycol (MIRALAX / GLYCOLAX) packet Take 17 g by mouth daily as needed.     ranolazine (RANEXA) 1000 MG SR tablet TAKE 1 TABLET BY MOUTH TWICE A DAY 60 tablet 4   rosuvastatin (CRESTOR) 10 MG tablet TAKE 1 TABLET BY MOUTH DAILY 90 tablet 2   tamsulosin (FLOMAX) 0.4 MG CAPS capsule TAKE ONE CAPSULE BY MOUTH DAILY 90 capsule 3   omeprazole (PRILOSEC) 20 MG capsule TAKE ONE CAPSULE BY MOUTH EVERY MORNING 90 capsule 3  No facility-administered medications prior to visit.    Review of Systems;  Patient denies headache, fevers, malaise, unintentional weight loss, skin rash, eye pain, sinus congestion and sinus pain, sore throat, dysphagia,  hemoptysis , cough, dyspnea, wheezing, chest pain, palpitations, orthopnea, edema, abdominal pain, nausea, melena, diarrhea, constipation, flank pain, dysuria, hematuria, urinary  Frequency, nocturia, numbness, tingling, seizures,  Focal weakness, Loss of consciousness,  Tremor, insomnia, depression, anxiety, and suicidal ideation.      Objective:  BP 138/78   Pulse (!) 53   Temp 98.1 F (36.7 C) (Oral)   Ht 5\' 6"  (1.676 m)   Wt 176 lb 3.2 oz (79.9 kg)   SpO2 95%   BMI 28.44 kg/m   BP Readings from Last 3 Encounters:  09/17/22 138/78  09/09/22 116/78  08/28/22 124/60    Wt Readings  from Last 3 Encounters:  09/17/22 176 lb 3.2 oz (79.9 kg)  09/09/22 177 lb 3.2 oz (80.4 kg)  08/28/22 177 lb 4 oz (80.4 kg)    Physical Exam Vitals reviewed.  Constitutional:      General: He is not in acute distress.    Appearance: Normal appearance. He is normal weight. He is not ill-appearing, toxic-appearing or diaphoretic.  HENT:     Head: Normocephalic.  Eyes:     General: No scleral icterus.       Right eye: No discharge.        Left eye: No discharge.     Conjunctiva/sclera: Conjunctivae normal.  Cardiovascular:     Rate and Rhythm: Normal rate and regular rhythm.     Heart sounds: Normal heart sounds.  Pulmonary:     Effort: Pulmonary effort is normal. No respiratory distress.     Breath sounds: Normal breath sounds.  Musculoskeletal:        General: Normal range of motion.     Cervical back: Normal range of motion.  Skin:    General: Skin is warm and dry.  Neurological:     General: No focal deficit present.     Mental Status: He is alert and oriented to person, place, and time. Mental status is at baseline.     Motor: Tremor present.     Gait: Gait abnormal.     Comments: Slight resting tremor of hnds   Psychiatric:        Mood and Affect: Mood normal.        Behavior: Behavior normal.        Thought Content: Thought content normal.        Judgment: Judgment normal.    Lab Results  Component Value Date   HGBA1C 5.9 09/17/2022   HGBA1C 5.7 03/18/2022   HGBA1C 5.4 05/07/2021    Lab Results  Component Value Date   CREATININE 1.88 (H) 09/17/2022   CREATININE 2.0 (A) 03/04/2022   CREATININE 1.55 (H) 07/06/2021    Lab Results  Component Value Date   WBC 4.3 09/17/2022   HGB 11.3 (L) 09/17/2022   HCT 32.4 (L) 09/17/2022   PLT 151.0 09/17/2022   GLUCOSE 107 (H) 09/17/2022   CHOL 113 09/17/2022   TRIG 86.0 09/17/2022   HDL 60.10 09/17/2022   LDLDIRECT 32.0 09/17/2022   LDLCALC 36 09/17/2022   ALT 5 09/17/2022   AST 14 09/17/2022   NA 140  09/17/2022   K 4.6 09/17/2022   CL 103 09/17/2022   CREATININE 1.88 (H) 09/17/2022   BUN 22 09/17/2022   CO2 28 09/17/2022   TSH 2.04  09/17/2022   PSA 1.10 09/06/2020   HGBA1C 5.9 09/17/2022   MICROALBUR <0.7 08/17/2019    DG Chest Portable 1 View  Result Date: 07/06/2021 CLINICAL DATA:  Per Triage: Pt via EMS from home. Pt c/o generalized weakness since this AM. Pt states he is normally able to walk and now he cannot. Pt has a hx of parkinson. Pt is AANDOx4 and slow answer EXAM: PORTABLE CHEST 1 VIEW COMPARISON:  05/07/2021. 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 Cardiac silhouette is normal in size. Normal mediastinal and hilar contours. Clear lungs.  No pleural effusion or pneumothorax. Skeletal structures are grossly intact. IMPRESSION: No acute cardiopulmonary disease. Electronically Signed   By: Amie Portland M.D.   On: 07/06/2021 16:01    Assessment & Plan:  .Primary hypertension Assessment & Plan: Previous attempts at tight control has resulted in dcreased GFR  and increased imbalance resulting in falls.  No change today given recent readings at home  being < 140/90 .  Reminded to continue to  monitor readings at home   Orders: -     Comprehensive metabolic panel  Pure hypercholesterolemia -     Lipid panel -     LDL cholesterol, direct  Other fatigue -     CBC with Differential/Platelet -     TSH  History of diabetes mellitus, type II Assessment & Plan: His diabetes has been "cured" for over a year,  But he has developed a "sweet tooth".  A1c is climbing but fasting glucose non diagnostic   Lab Results  Component Value Date   HGBA1C 5.9 09/17/2022     Orders: -     Hemoglobin A1c  Chronic midline low back pain without sciatica Assessment & Plan: NON RADIATING .  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  Continue tylenol,  add back extension exercises and refer to PT upon request   Stage 3b chronic kidney disease Assessment &  Plan: Improved GFR with liberalization of Blood pressure .  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      Attending [employer] program 3 days per week . Marland Kitchen  Advised to keep a walker near the bed. .    Sleep apnea in adult Assessment & Plan: Diagnosed by sleep study. he is wearing her CPAP every night a minimum of 6 hours per night and notes improved daytime wakefulness and decreased fatigue     Other orders -     Omeprazole; Take 1 capsule (20 mg total) by mouth every morning.  Dispense: 90 capsule; Refill: 3   Followw-up: Return in about 6 months (around 03/19/2023) for follow up diabetes.   [name], MD

## 2022-09-17 NOTE — Assessment & Plan Note (Signed)
NON RADIATING .  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  Continue tylenol,  add back extension exercises and refer to PT upon request

## 2022-09-18 NOTE — Assessment & Plan Note (Signed)
Diagnosed by sleep study. he is wearing her CPAP every night a minimum of 6 hours per night and notes improved daytime wakefulness and decreased fatigue  

## 2022-09-18 NOTE — Assessment & Plan Note (Signed)
His diabetes has been "cured" for over a year,  But he has developed a "sweet tooth".  A1c is climbing but fasting glucose non diagnostic   Lab Results  Component Value Date   HGBA1C 5.9 09/17/2022

## 2022-09-18 NOTE — Assessment & Plan Note (Signed)
Managed with  Sinemet taken every 4 hours since a freezing episode resulted in a fall.      Attending [employer] program 3 days per week . .  Advised to keep a walker near the bed. .  ?

## 2022-09-18 NOTE — Assessment & Plan Note (Signed)
Improved GFR with liberalization of Blood pressure .  Lab Results  Component Value Date   CREATININE 1.88 (H) 09/17/2022   Lab Results  Component Value Date   NA 140 09/17/2022   K 4.6 09/17/2022   CL 103 09/17/2022   CO2 28 09/17/2022

## 2022-09-24 IMAGING — CR DG CHEST 2V
1 series · 2 of 2 positions shown · non-contrast
Comparison: CT 11/21/2020.  Chest x-ray 11/16/2020.

CLINICAL DATA: Pneumonia.  Follow-up exam.

EXAM:
CHEST - 2 VIEW

[Series 1: w chest pa · 0.14mm/px · 2 of 2 slices shown]
[im 1/2]
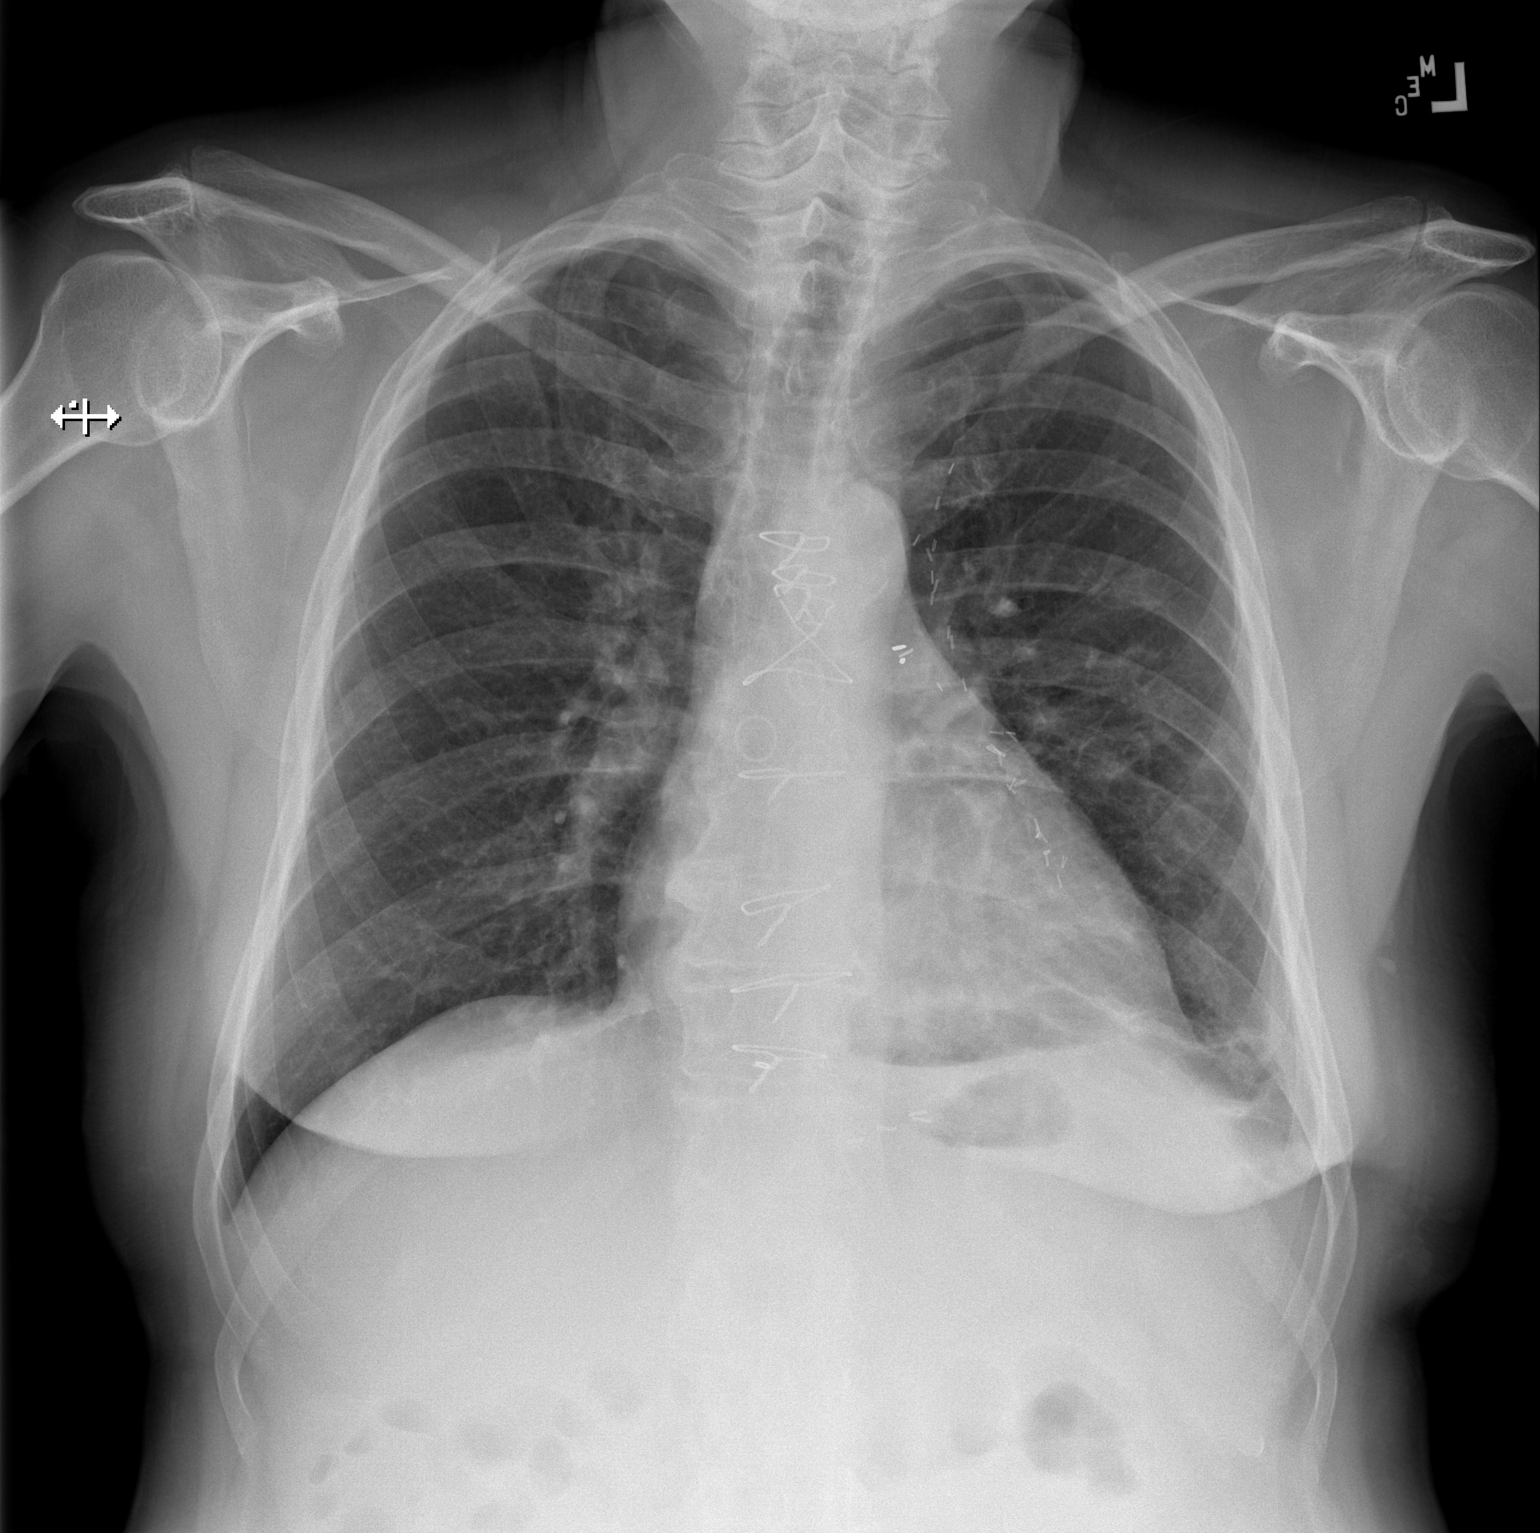
[im 2/2]
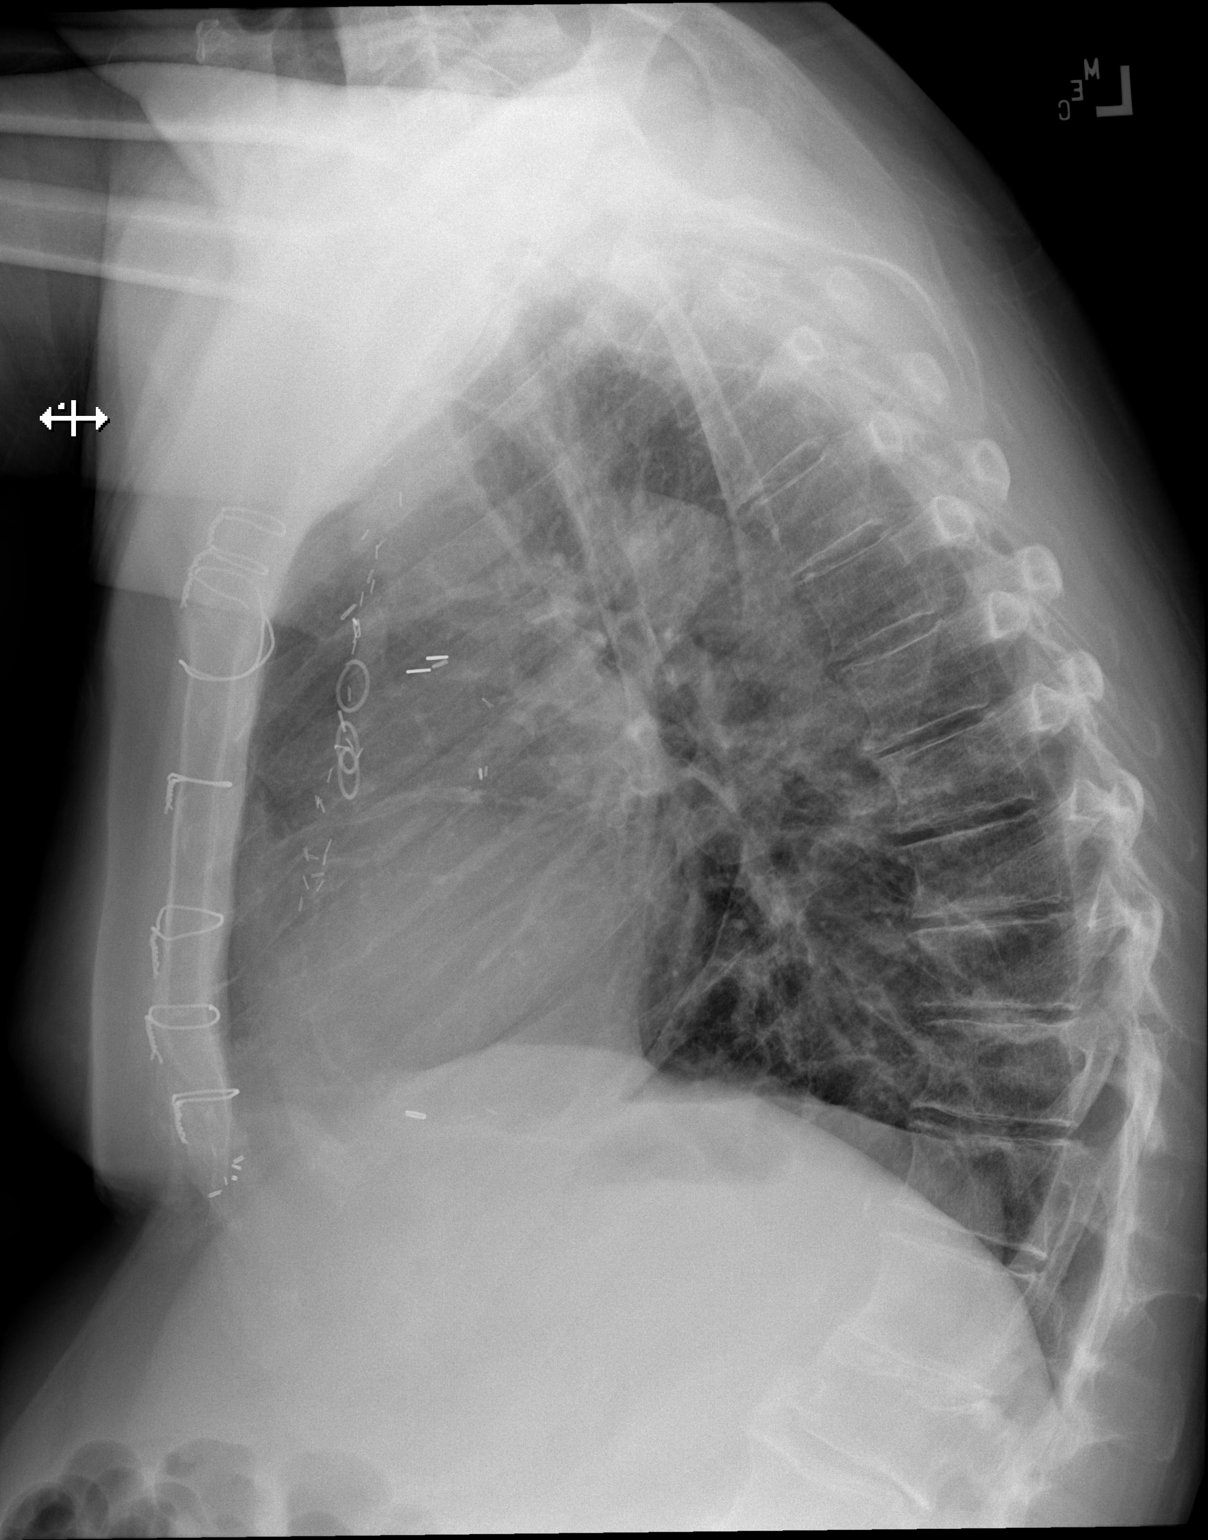

[2 of 2 positions shown; findings below may reference images not displayed]

FINDINGS: Prior CABG. Heart size stable. Persistent but improved left base
infiltrate and small left pleural effusion. Degenerative change
thoracic spine.
IMPRESSION: Persistent but improved left base infiltrate and small left pleural
effusion.

## 2022-10-07 ENCOUNTER — Other Ambulatory Visit: Payer: Self-pay | Admitting: Cardiovascular Disease

## 2022-10-10 ENCOUNTER — Other Ambulatory Visit: Payer: Self-pay | Admitting: Cardiovascular Disease

## 2022-11-12 ENCOUNTER — Emergency Department
Admission: EM | Admit: 2022-11-12 | Discharge: 2022-11-12 | Disposition: A | Discharge status: Home / Self Care | Payer: Medicare Other | Attending: Emergency Medicine | Admitting: Emergency Medicine

## 2022-11-12 ENCOUNTER — Emergency Department: Payer: Medicare Other

## 2022-11-12 ENCOUNTER — Other Ambulatory Visit: Payer: Self-pay

## 2022-11-12 DIAGNOSIS — I959 Hypotension, unspecified: Secondary | ICD-10-CM | POA: Diagnosis not present

## 2022-11-12 DIAGNOSIS — S0081XA Abrasion of other part of head, initial encounter: Secondary | ICD-10-CM

## 2022-11-12 DIAGNOSIS — G20C Parkinsonism, unspecified: Secondary | ICD-10-CM | POA: Insufficient documentation

## 2022-11-12 DIAGNOSIS — S0993XA Unspecified injury of face, initial encounter: Secondary | ICD-10-CM | POA: Diagnosis present

## 2022-11-12 DIAGNOSIS — S0083XA Contusion of other part of head, initial encounter: Secondary | ICD-10-CM | POA: Diagnosis not present

## 2022-11-12 DIAGNOSIS — S0181XA Laceration without foreign body of other part of head, initial encounter: Secondary | ICD-10-CM | POA: Diagnosis not present

## 2022-11-12 DIAGNOSIS — W19XXXA Unspecified fall, initial encounter: Secondary | ICD-10-CM

## 2022-11-12 DIAGNOSIS — Z043 Encounter for examination and observation following other accident: Secondary | ICD-10-CM | POA: Diagnosis not present

## 2022-11-12 DIAGNOSIS — W01198A Fall on same level from slipping, tripping and stumbling with subsequent striking against other object, initial encounter: Secondary | ICD-10-CM | POA: Insufficient documentation

## 2022-11-12 NOTE — ED Provider Notes (Signed)
[facility] Provider Note  Patient Contact: 3:26 PM (approximate)   History   Fall   HPI  Johnathan Arnold. is a 77 y.o. male who presents the emergency department after a mechanical fall.  Patient has Parkinson's, sometimes walks with a shuffling gait.  Patient caught his toe causing him to fall forward.  He did hit his head but did not lose consciousness.  Patient has remained neurologically intact.  He presents to the ED for evaluation to ensure no evidence of underlying fracture or head bleed.     Physical Exam   Triage Vital Signs: ED Triage Vitals  Enc Vitals Group     BP 11/12/22 1257 (!) 146/55     Pulse Rate 11/12/22 1257 60     Resp 11/12/22 1257 18     Temp 11/12/22 1257 97.9 F (36.6 C)     Temp Source 11/12/22 1257 Oral     SpO2 11/12/22 1257 98 %     Weight 11/12/22 1258 174 lb 2.6 oz (79 kg)     Height --      Head Circumference --      Peak Flow --      Pain Score 11/12/22 1258 0     Pain Loc --      Pain Edu? --      Excl. in GC? --     Most recent vital signs: Vitals:   11/12/22 1257  BP: (!) 146/55  Pulse: 60  Resp: 18  Temp: 97.9 F (36.6 C)  SpO2: 98%     General: Alert and in no acute distress. Eyes:  PERRL. EOMI. Head: Patient has abrasion to the left eyebrow and left upper lip.  No active bleeding.  Area scabbed over.  No evidence of retained foreign body.  No frank laceration requiring closure.  Patient has tenderness in this area as to be expected but no palpable abnormality or crepitus underlying.  No battle signs, raccoon eyes or serosanguineous wound drainage from ears or nares.  Patient does have left eye periorbital ecchymosis.  Neck: No stridor. No cervical spine tenderness to palpation.  Cardiovascular:  Good peripheral perfusion Respiratory: Normal respiratory effort without tachypnea or retractions. Lungs CTAB. Musculoskeletal: Full range of motion to all extremities.  Neurologic:  No gross focal  neurologic deficits are appreciated.  Patient is neurologically intact on exam.  Cranial nerves II to XII grossly intact. Skin:   No rash noted Other:   ED Results / Procedures / Treatments   Labs (all labs ordered are listed, but only abnormal results are displayed) Labs Reviewed - No data to display   EKG     RADIOLOGY  I personally viewed, evaluated, and interpreted these images as part of my medical decision making, as well as reviewing the written report by the radiologist.  ED Provider Interpretation: CT scans of head, face, neck are reassuring with no acute underlying fracture or intracranial hemorrhage.  CT Head Wo Contrast  Result Date: 11/12/2022 CLINICAL DATA:  Trip and fall EXAM: CT HEAD WITHOUT CONTRAST CT MAXILLOFACIAL WITHOUT CONTRAST CT CERVICAL SPINE WITHOUT CONTRAST TECHNIQUE: Multidetector CT imaging of the head, cervical spine, and maxillofacial structures were performed using the standard protocol without intravenous contrast. Multiplanar CT image reconstructions of the cervical spine and maxillofacial structures were also generated. RADIATION DOSE REDUCTION: This exam was performed according to the departmental dose-optimization program which includes automated exposure control, adjustment of the mA and/or kV according to patient size  and/or use of iterative reconstruction technique. COMPARISON:  05/07/2021 FINDINGS: CT HEAD FINDINGS Brain: No evidence of acute infarction, hemorrhage, hydrocephalus, extra-axial collection or mass lesion/mass effect. Periventricular white matter hypodensity. Vascular: No hyperdense vessel or unexpected calcification. CT FACIAL BONES FINDINGS Skull: Normal. Negative for fracture or focal lesion. Facial bones: No displaced fractures or dislocations. Sinuses/Orbits: No acute finding. Other: None. CT CERVICAL SPINE FINDINGS Alignment: Normal. Skull base and vertebrae: No acute fracture. No primary bone lesion or focal pathologic process. Soft  tissues and spinal canal: No prevertebral fluid or swelling. No visible canal hematoma. Disc levels: Moderate cervical disc degenerative disease throughout. Upper chest: Negative. Other: None. IMPRESSION: 1. No acute intracranial pathology. Small-vessel white matter disease. 2. No displaced fracture or dislocation of the facial bones. 3. No fracture or static subluxation of the cervical spine. 4. Moderate cervical disc degenerative disease throughout. Electronically Signed   By: Jearld Lesch M.D.   On: 11/12/2022 16:16   CT Cervical Spine Wo Contrast  Result Date: 11/12/2022 CLINICAL DATA:  Trip and fall EXAM: CT HEAD WITHOUT CONTRAST CT MAXILLOFACIAL WITHOUT CONTRAST CT CERVICAL SPINE WITHOUT CONTRAST TECHNIQUE: Multidetector CT imaging of the head, cervical spine, and maxillofacial structures were performed using the standard protocol without intravenous contrast. Multiplanar CT image reconstructions of the cervical spine and maxillofacial structures were also generated. RADIATION DOSE REDUCTION: This exam was performed according to the departmental dose-optimization program which includes automated exposure control, adjustment of the mA and/or kV according to patient size and/or use of iterative reconstruction technique. COMPARISON:  05/07/2021 FINDINGS: CT HEAD FINDINGS Brain: No evidence of acute infarction, hemorrhage, hydrocephalus, extra-axial collection or mass lesion/mass effect. Periventricular white matter hypodensity. Vascular: No hyperdense vessel or unexpected calcification. CT FACIAL BONES FINDINGS Skull: Normal. Negative for fracture or focal lesion. Facial bones: No displaced fractures or dislocations. Sinuses/Orbits: No acute finding. Other: None. CT CERVICAL SPINE FINDINGS Alignment: Normal. Skull base and vertebrae: No acute fracture. No primary bone lesion or focal pathologic process. Soft tissues and spinal canal: No prevertebral fluid or swelling. No visible canal hematoma. Disc levels:  Moderate cervical disc degenerative disease throughout. Upper chest: Negative. Other: None. IMPRESSION: 1. No acute intracranial pathology. Small-vessel white matter disease. 2. No displaced fracture or dislocation of the facial bones. 3. No fracture or static subluxation of the cervical spine. 4. Moderate cervical disc degenerative disease throughout. Electronically Signed   By: Jearld Lesch M.D.   On: 11/12/2022 16:16   CT Maxillofacial Wo Contrast  Result Date: 11/12/2022 CLINICAL DATA:  Trip and fall EXAM: CT HEAD WITHOUT CONTRAST CT MAXILLOFACIAL WITHOUT CONTRAST CT CERVICAL SPINE WITHOUT CONTRAST TECHNIQUE: Multidetector CT imaging of the head, cervical spine, and maxillofacial structures were performed using the standard protocol without intravenous contrast. Multiplanar CT image reconstructions of the cervical spine and maxillofacial structures were also generated. RADIATION DOSE REDUCTION: This exam was performed according to the departmental dose-optimization program which includes automated exposure control, adjustment of the mA and/or kV according to patient size and/or use of iterative reconstruction technique. COMPARISON:  05/07/2021 FINDINGS: CT HEAD FINDINGS Brain: No evidence of acute infarction, hemorrhage, hydrocephalus, extra-axial collection or mass lesion/mass effect. Periventricular white matter hypodensity. Vascular: No hyperdense vessel or unexpected calcification. CT FACIAL BONES FINDINGS Skull: Normal. Negative for fracture or focal lesion. Facial bones: No displaced fractures or dislocations. Sinuses/Orbits: No acute finding. Other: None. CT CERVICAL SPINE FINDINGS Alignment: Normal. Skull base and vertebrae: No acute fracture. No primary bone lesion or focal pathologic process. Soft tissues and  spinal canal: No prevertebral fluid or swelling. No visible canal hematoma. Disc levels: Moderate cervical disc degenerative disease throughout. Upper chest: Negative. Other: None. IMPRESSION:  1. No acute intracranial pathology. Small-vessel white matter disease. 2. No displaced fracture or dislocation of the facial bones. 3. No fracture or static subluxation of the cervical spine. 4. Moderate cervical disc degenerative disease throughout. Electronically Signed   By: Jearld Lesch M.D.   On: 11/12/2022 16:16    PROCEDURES:  Critical Care performed: No  Procedures   MEDICATIONS ORDERED IN ED: Medications - No data to display   IMPRESSION / MDM / ASSESSMENT AND PLAN / ED COURSE  I reviewed the triage vital signs and the nursing notes.                                 Differential diagnosis includes, but is not limited to, fall, skull fracture, intracranial hemorrhage, patient fracture, laceration, abrasion   Patient's presentation is most consistent with acute presentation with potential threat to life or bodily function.   Patient's diagnosis is consistent with fall, facial abrasion, facial contusion.  Patient presents emergency department after taking a mechanical fall.  Patient does have Parkinson's, sometimes walks with a shuffling gait and caught his toe on an edge in the walkway.  Patient fell and hit his head.  No loss of consciousness.  Has had no concerning neurodeficits.  Patient has abrasions noted to the eyebrow and to the upper lip.  No wounds requiring immediate closure.  Patient was neurologically intact but given the mechanism CT scan of the head, face and cervical spine were obtained.  These are reassuring without acute traumatic finding.  At this time patient is stable for discharge.  Tylenol/Motrin for any headache or pain complaints.  Concerning signs and symptoms and return precautions discussed with the patient and his wife.  Follow-up primary care as needed. Patient is given ED precautions to return to the ED for any worsening or new symptoms.     FINAL CLINICAL IMPRESSION(S) / ED DIAGNOSES   Final diagnoses:  Fall, initial encounter  Contusion of  face, initial encounter  Abrasion of face, initial encounter     Rx / DC Orders   ED Discharge Orders     None        Note:  This document was prepared using Dragon voice recognition software and may include unintentional dictation errors.   Racheal Patches, PA-C 11/12/22 1816    Georga Hacking, MD 11/12/22 [phone]

## 2022-11-12 NOTE — ED Triage Notes (Signed)
Pt lost balance walking too fast today and tripped and fell hit head. Abrasion above left eye, left cheek and above lip. Pt bleeding controlled. Denies any pain. No blood thinners. No LOC.

## 2022-11-12 NOTE — ED Triage Notes (Signed)
First Nurse Note: Pt to ED [facility] for fall trip at [facility]. No blood thinners. Laceration above left eye. Denies LOC.  Hx parkinsons

## 2022-11-17 ENCOUNTER — Telehealth: Payer: Self-pay

## 2022-11-17 NOTE — Telephone Encounter (Signed)
Transition Care Management Unsuccessful Follow-up Telephone Call  Date of discharge and from where:  [place] [facility] 6/5  Attempts:  1st Attempt  Reason for unsuccessful TCM follow-up call:  Left voice message .  Lenard Forth [facility] Guide, [facility] [phone] [address] [place], [place], [place] [address] Phone: [phone] Email: [email or web address]

## 2022-11-18 ENCOUNTER — Telehealth: Payer: Self-pay

## 2022-11-18 NOTE — Telephone Encounter (Signed)
Transition Care Management Unsuccessful Follow-up Telephone Call  Date of discharge and from where:  [place] 6/5  Attempts:  2nd Attempt  Reason for unsuccessful TCM follow-up call:  Left voice message.   Lenard Forth [facility] Guide, [facility] [phone] [address] [place], [place], [place] [address] Phone: [phone] Email: [email or web address]

## 2022-11-25 ENCOUNTER — Telehealth: Payer: Self-pay | Admitting: Internal Medicine

## 2022-11-25 NOTE — Telephone Encounter (Signed)
Copied from CRM [id number]. Topic: Medicare AWV >> Nov 25, 2022 10:41 AM Payton Doughty wrote: Reason for CRM: LM 11/25/2022 to schedule AWV   Verlee Rossetti; Care Guide Ambulatory Clinical Support [place] [facility] Direct Dial: [phone]

## 2022-12-04 ENCOUNTER — Other Ambulatory Visit: Payer: Self-pay | Admitting: Neurology

## 2022-12-05 ENCOUNTER — Other Ambulatory Visit: Payer: Self-pay | Admitting: Neurology

## 2022-12-05 NOTE — Telephone Encounter (Signed)
Call patient and tell him I refilled his klonopin but it looks like his ENT (dr. Andee Poles) is intermittently giving him valium.  I don't want him taking this with klonopin.  Its dangerous and they are essentially the same class of medication

## 2022-12-17 DIAGNOSIS — E119 Type 2 diabetes mellitus without complications: Secondary | ICD-10-CM | POA: Diagnosis not present

## 2022-12-30 ENCOUNTER — Encounter: Payer: Self-pay | Admitting: Neurology

## 2023-01-05 LAB — HM DIABETES EYE EXAM

## 2023-01-09 DIAGNOSIS — U071 COVID-19: Secondary | ICD-10-CM | POA: Diagnosis not present

## 2023-01-11 DIAGNOSIS — I2510 Atherosclerotic heart disease of native coronary artery without angina pectoris: Secondary | ICD-10-CM | POA: Diagnosis not present

## 2023-01-11 DIAGNOSIS — K920 Hematemesis: Secondary | ICD-10-CM | POA: Diagnosis not present

## 2023-01-11 DIAGNOSIS — I959 Hypotension, unspecified: Secondary | ICD-10-CM | POA: Diagnosis not present

## 2023-01-11 DIAGNOSIS — R001 Bradycardia, unspecified: Secondary | ICD-10-CM | POA: Diagnosis not present

## 2023-01-11 DIAGNOSIS — R531 Weakness: Secondary | ICD-10-CM | POA: Diagnosis not present

## 2023-01-11 DIAGNOSIS — R1111 Vomiting without nausea: Secondary | ICD-10-CM | POA: Diagnosis not present

## 2023-01-11 DIAGNOSIS — U071 COVID-19: Secondary | ICD-10-CM | POA: Diagnosis not present

## 2023-01-16 ENCOUNTER — Encounter: Payer: Self-pay | Admitting: Neurology

## 2023-01-16 ENCOUNTER — Encounter: Payer: Self-pay | Admitting: Internal Medicine

## 2023-01-16 NOTE — Telephone Encounter (Signed)
Spoke with pt and scheduled him for next tuesday.

## 2023-01-20 ENCOUNTER — Encounter: Payer: Self-pay | Admitting: Internal Medicine

## 2023-01-20 ENCOUNTER — Ambulatory Visit (INDEPENDENT_AMBULATORY_CARE_PROVIDER_SITE_OTHER): Payer: Medicare Other | Admitting: Internal Medicine

## 2023-01-20 VITALS — BP 176/66 | HR 51 | Ht 66.0 in | Wt 164.2 lb

## 2023-01-20 DIAGNOSIS — N1832 Chronic kidney disease, stage 3b: Secondary | ICD-10-CM

## 2023-01-20 DIAGNOSIS — Z8616 Personal history of COVID-19: Secondary | ICD-10-CM

## 2023-01-20 DIAGNOSIS — R001 Bradycardia, unspecified: Secondary | ICD-10-CM

## 2023-01-20 DIAGNOSIS — R29898 Other symptoms and signs involving the musculoskeletal system: Secondary | ICD-10-CM

## 2023-01-20 DIAGNOSIS — T50905A Adverse effect of unspecified drugs, medicaments and biological substances, initial encounter: Secondary | ICD-10-CM | POA: Insufficient documentation

## 2023-01-20 DIAGNOSIS — Z8639 Personal history of other endocrine, nutritional and metabolic disease: Secondary | ICD-10-CM

## 2023-01-20 DIAGNOSIS — I10 Essential (primary) hypertension: Secondary | ICD-10-CM | POA: Diagnosis not present

## 2023-01-20 DIAGNOSIS — T50905S Adverse effect of unspecified drugs, medicaments and biological substances, sequela: Secondary | ICD-10-CM

## 2023-01-20 LAB — HM DIABETES FOOT EXAM: HM Diabetic Foot Exam: NORMAL

## 2023-01-20 MED ORDER — LOSARTAN POTASSIUM 25 MG PO TABS: 25.0000 mg | ORAL_TABLET | Freq: Every day | ORAL | 1 refills | Status: DC

## 2023-01-20 NOTE — Assessment & Plan Note (Signed)
Previous attempts at tight control has resulted in dcreased GFR  and increased imbalance resulting in falls.  No change today given recent readings at home  being < 140/90 .  Reminded to continue to  monitor readings at home , take the losartan at night and Imdur in the morning

## 2023-01-20 NOTE — Patient Instructions (Addendum)
Change losartan to evening dosing (after dinner   or before bed) and continue isosorbide in the morning  Check BP readings once daily around 10 am and send me the readings   I agree:  no losartan if BP is < 135  Only use 1/2 tablet of  furosemide when needed for fluid retention

## 2023-01-20 NOTE — Progress Notes (Signed)
Subjective:  Patient ID: Johnathan Arnold., male    DOB: [birth year]  Age: 77 y.o. MRN: [id number]  CC: The primary encounter diagnosis was Weakness of both lower extremities. Diagnoses of History of diabetes mellitus, type II, Adverse effect of drug, sequela, Bilateral leg weakness, Bradycardia, Stage 3b chronic kidney disease (HCC), History of COVID-19, and Primary hypertension were also pertinent to this visit.   HPI Johnathan Arnold. presents for  Chief Complaint  Patient presents with   Hospitalization Follow-up    ED follow up    [place] and developed COVID infection. Treated at local urgent care ER on August 4  and offered PAXLOVID. Unclear if labs were done .  After 3 doses  he developed increased tremor and significant leg weakness which rendered him unable to rise from chair or walk. .  States hat he remained in bed for 3 days , but was taken to EMS on August 5 and CBC, UA and BMET were  done.  He has not recovered his pre illness strength,  but is able to walk using a walker .  Needs PT    HTN/CAD:   Although he has been prescribed. Imdur, losartan and furosemide, he does not take them regularly because he becomes weak if BP drops below 130.   He checks BP 2-3 times daily and omits the losartan if BP is < 140.  He is using the furosemide once a week on average .  ECHO reviewed from 2022:  normal EF   Type 2 DM: not checking sugars.  Eye exam 2 weeks ago at [facility]  foot exam normal today ,   HM: declines Tdap.    Outpatient Medications Prior to Visit  Medication Sig Dispense Refill   aspirin EC 81 MG tablet Take 1 tablet (81 mg total) by mouth at bedtime. 30 tablet 11   carbidopa-levodopa (SINEMET CR) 50-200 MG tablet TAKE 1 TABLET BY MOUTH AT BEDTIME 90 tablet 0   carbidopa-levodopa (SINEMET IR) 25-100 MG tablet TAKE TAKE 2 TABLETS BY MOUTH EVERY MORNING AT 8, TAKE TAKE 2 TABLETS BY MOUTH DAILY AT 11AM, TAKE 2 TABLETS BY MOUTH  DAILY AT 2 IN THE AFTERNOON AND TAKE ONE TABLET BY MOUTH EVERY EVENING AT 5 630 tablet 0   clonazePAM (KLONOPIN) 1 MG tablet TAKE 1 TABLET BY MOUTH AT BEDTIME 30 tablet 3   diazepam (VALIUM) 5 MG tablet Take 5 mg by mouth 3 (three) times daily as needed.     furosemide (LASIX) 40 MG tablet Take 40 mg by mouth daily.     isosorbide mononitrate (IMDUR) 60 MG 24 hr tablet TAKE 1 TABLET BY MOUTH DAILY 90 tablet 1   melatonin 3 MG TABS tablet Take 1.5 mg by mouth at bedtime as needed.     Multiple Vitamin (MULTIVITAMIN) tablet Take 1 tablet by mouth daily.     nitroGLYCERIN (NITROSTAT) 0.4 MG SL tablet Place 1 tablet (0.4 mg total) under the tongue every 5 (five) minutes as needed for chest pain. 25 tablet 1   omeprazole (PRILOSEC) 20 MG capsule Take 1 capsule (20 mg total) by mouth every morning. 90 capsule 3   polyethylene glycol (MIRALAX / GLYCOLAX) packet Take 17 g by mouth daily as needed.     ranolazine (RANEXA) 1000 MG SR tablet TAKE 1 TABLET BY MOUTH TWICE A DAY 60 tablet 4   rosuvastatin (CRESTOR) 10 MG tablet TAKE 1 TABLET BY  MOUTH DAILY 90 tablet 2   tamsulosin (FLOMAX) 0.4 MG CAPS capsule TAKE ONE CAPSULE BY MOUTH DAILY 90 capsule 3   losartan (COZAAR) 25 MG tablet TAKE 1 TABLET BY MOUTH DAILY 90 tablet 1   No facility-administered medications prior to visit.    Review of Systems;  Patient denies headache, fevers, malaise, unintentional weight loss, skin rash, eye pain, sinus congestion and sinus pain, sore throat, dysphagia,  hemoptysis , cough, dyspnea, wheezing, chest pain, palpitations, orthopnea, edema, abdominal pain, nausea, melena, diarrhea, constipation, flank pain, dysuria, hematuria, urinary  Frequency, nocturia, numbness, tingling, seizures,  Focal weakness, Loss of consciousness,  Tremor, insomnia, depression, anxiety, and suicidal ideation.      Objective:  BP (!) 176/66   Pulse (!) 51   Ht 5\' 6"  (1.676 m)   Wt 164 lb 3.2 oz (74.5 kg)   SpO2 99%   BMI 26.50 kg/m    BP Readings from Last 3 Encounters:  01/20/23 (!) 176/66  11/12/22 (!) 146/55  09/17/22 138/78    Wt Readings from Last 3 Encounters:  01/20/23 164 lb 3.2 oz (74.5 kg)  11/12/22 174 lb 2.6 oz (79 kg)  09/17/22 176 lb 3.2 oz (79.9 kg)    Physical Exam Vitals reviewed.  Constitutional:      General: He is not in acute distress.    Appearance: Normal appearance. He is normal weight. He is not ill-appearing, toxic-appearing or diaphoretic.  HENT:     Head: Normocephalic.  Eyes:     General: No scleral icterus.       Right eye: No discharge.        Left eye: No discharge.     Conjunctiva/sclera: Conjunctivae normal.  Cardiovascular:     Rate and Rhythm: Normal rate and regular rhythm.     Heart sounds: Normal heart sounds.  Pulmonary:     Effort: Pulmonary effort is normal. No respiratory distress.     Breath sounds: Normal breath sounds.  Musculoskeletal:        General: Normal range of motion.     Cervical back: Normal range of motion.  Skin:    General: Skin is warm and dry.  Neurological:     General: No focal deficit present.     Mental Status: He is alert and oriented to person, place, and time. Mental status is at baseline.     Cranial Nerves: Cranial nerves 2-12 are intact.     Sensory: Sensation is intact.     Motor: Tremor present.     Comments: Able to rise from check without assistance   Psychiatric:        Mood and Affect: Mood normal.        Behavior: Behavior normal.        Thought Content: Thought content normal.        Judgment: Judgment normal.     Lab Results  Component Value Date   HGBA1C 5.9 09/17/2022   HGBA1C 5.7 03/18/2022   HGBA1C 5.4 05/07/2021    Lab Results  Component Value Date   CREATININE 1.88 (H) 09/17/2022   CREATININE 2.0 (A) 03/04/2022   CREATININE 1.55 (H) 07/06/2021    Lab Results  Component Value Date   WBC 4.3 09/17/2022   HGB 11.3 (L) 09/17/2022   HCT 32.4 (L) 09/17/2022   PLT 151.0 09/17/2022   GLUCOSE 107  (H) 09/17/2022   CHOL 113 09/17/2022   TRIG 86.0 09/17/2022   HDL 60.10 09/17/2022   LDLDIRECT 32.0 09/17/2022  LDLCALC 36 09/17/2022   ALT 5 09/17/2022   AST 14 09/17/2022   NA 140 09/17/2022   K 4.6 09/17/2022   CL 103 09/17/2022   CREATININE 1.88 (H) 09/17/2022   BUN 22 09/17/2022   CO2 28 09/17/2022   TSH 2.04 09/17/2022   PSA 1.10 09/06/2020   HGBA1C 5.9 09/17/2022   MICROALBUR <0.7 08/17/2019    CT Head Wo Contrast  Result Date: 11/12/2022 CLINICAL DATA:  Trip and fall EXAM: CT HEAD WITHOUT CONTRAST CT MAXILLOFACIAL WITHOUT CONTRAST CT CERVICAL SPINE WITHOUT CONTRAST TECHNIQUE: Multidetector CT imaging of the head, cervical spine, and maxillofacial structures were performed using the standard protocol without intravenous contrast. Multiplanar CT image reconstructions of the cervical spine and maxillofacial structures were also generated. RADIATION DOSE REDUCTION: This exam was performed according to the departmental dose-optimization program which includes automated exposure control, adjustment of the mA and/or kV according to patient size and/or use of iterative reconstruction technique. COMPARISON:  05/07/2021 FINDINGS: CT HEAD FINDINGS Brain: No evidence of acute infarction, hemorrhage, hydrocephalus, extra-axial collection or mass lesion/mass effect. Periventricular white matter hypodensity. Vascular: No hyperdense vessel or unexpected calcification. CT FACIAL BONES FINDINGS Skull: Normal. Negative for fracture or focal lesion. Facial bones: No displaced fractures or dislocations. Sinuses/Orbits: No acute finding. Other: None. CT CERVICAL SPINE FINDINGS Alignment: Normal. Skull base and vertebrae: No acute fracture. No primary bone lesion or focal pathologic process. Soft tissues and spinal canal: No prevertebral fluid or swelling. No visible canal hematoma. Disc levels: Moderate cervical disc degenerative disease throughout. Upper chest: Negative. Other: None. IMPRESSION: 1. No acute  intracranial pathology. Small-vessel white matter disease. 2. No displaced fracture or dislocation of the facial bones. 3. No fracture or static subluxation of the cervical spine. 4. Moderate cervical disc degenerative disease throughout. Electronically Signed   By: Jearld Lesch M.D.   On: 11/12/2022 16:16   CT Cervical Spine Wo Contrast  Result Date: 11/12/2022 CLINICAL DATA:  Trip and fall EXAM: CT HEAD WITHOUT CONTRAST CT MAXILLOFACIAL WITHOUT CONTRAST CT CERVICAL SPINE WITHOUT CONTRAST TECHNIQUE: Multidetector CT imaging of the head, cervical spine, and maxillofacial structures were performed using the standard protocol without intravenous contrast. Multiplanar CT image reconstructions of the cervical spine and maxillofacial structures were also generated. RADIATION DOSE REDUCTION: This exam was performed according to the departmental dose-optimization program which includes automated exposure control, adjustment of the mA and/or kV according to patient size and/or use of iterative reconstruction technique. COMPARISON:  05/07/2021 FINDINGS: CT HEAD FINDINGS Brain: No evidence of acute infarction, hemorrhage, hydrocephalus, extra-axial collection or mass lesion/mass effect. Periventricular white matter hypodensity. Vascular: No hyperdense vessel or unexpected calcification. CT FACIAL BONES FINDINGS Skull: Normal. Negative for fracture or focal lesion. Facial bones: No displaced fractures or dislocations. Sinuses/Orbits: No acute finding. Other: None. CT CERVICAL SPINE FINDINGS Alignment: Normal. Skull base and vertebrae: No acute fracture. No primary bone lesion or focal pathologic process. Soft tissues and spinal canal: No prevertebral fluid or swelling. No visible canal hematoma. Disc levels: Moderate cervical disc degenerative disease throughout. Upper chest: Negative. Other: None. IMPRESSION: 1. No acute intracranial pathology. Small-vessel white matter disease. 2. No displaced fracture or dislocation of  the facial bones. 3. No fracture or static subluxation of the cervical spine. 4. Moderate cervical disc degenerative disease throughout. Electronically Signed   By: Jearld Lesch M.D.   On: 11/12/2022 16:16   CT Maxillofacial Wo Contrast  Result Date: 11/12/2022 CLINICAL DATA:  Trip and fall EXAM: CT HEAD WITHOUT CONTRAST CT  MAXILLOFACIAL WITHOUT CONTRAST CT CERVICAL SPINE WITHOUT CONTRAST TECHNIQUE: Multidetector CT imaging of the head, cervical spine, and maxillofacial structures were performed using the standard protocol without intravenous contrast. Multiplanar CT image reconstructions of the cervical spine and maxillofacial structures were also generated. RADIATION DOSE REDUCTION: This exam was performed according to the departmental dose-optimization program which includes automated exposure control, adjustment of the mA and/or kV according to patient size and/or use of iterative reconstruction technique. COMPARISON:  05/07/2021 FINDINGS: CT HEAD FINDINGS Brain: No evidence of acute infarction, hemorrhage, hydrocephalus, extra-axial collection or mass lesion/mass effect. Periventricular white matter hypodensity. Vascular: No hyperdense vessel or unexpected calcification. CT FACIAL BONES FINDINGS Skull: Normal. Negative for fracture or focal lesion. Facial bones: No displaced fractures or dislocations. Sinuses/Orbits: No acute finding. Other: None. CT CERVICAL SPINE FINDINGS Alignment: Normal. Skull base and vertebrae: No acute fracture. No primary bone lesion or focal pathologic process. Soft tissues and spinal canal: No prevertebral fluid or swelling. No visible canal hematoma. Disc levels: Moderate cervical disc degenerative disease throughout. Upper chest: Negative. Other: None. IMPRESSION: 1. No acute intracranial pathology. Small-vessel white matter disease. 2. No displaced fracture or dislocation of the facial bones. 3. No fracture or static subluxation of the cervical spine. 4. Moderate cervical disc  degenerative disease throughout. Electronically Signed   By: Jearld Lesch M.D.   On: 11/12/2022 16:16    Assessment & Plan:  .Weakness of both lower extremities -     Ambulatory referral to Physical Therapy  History of diabetes mellitus, type II -     Microalbumin / creatinine urine ratio; Future  Adverse effect of drug, sequela Assessment & Plan: He developed significant bilateral LE weakness after taking Paxlovid for mild COVID reaction resulting in need for cane.  PT referral made.    Bilateral leg weakness Assessment & Plan: He reports progressive weakness and recent episode of falling after taking Paxlovid.Marland Kitchen  Referral to  [facility]  PT for strength training    Bradycardia Assessment & Plan: Also aggravated by use of Paxlovid. Improved from August 5 pulse of 47   Stage 3b chronic kidney disease (HCC) Assessment & Plan: Improved GFR with liberalization of Blood pressure . Cr at recent ER visit was 1.77  Lab Results  Component Value Date   CREATININE 1.88 (H) 09/17/2022   Lab Results  Component Value Date   NA 140 09/17/2022   K 4.6 09/17/2022   CL 103 09/17/2022   CO2 28 09/17/2022      History of COVID-19 Assessment & Plan: He had another  mild case in August 2024,  [place] ., which aggravated his tremor and leg weakness .  have reminded him tp  forego any future vaccinations against COVID due to past adverse reactions to the vaccines   Primary hypertension Assessment & Plan: Previous attempts at tight control has resulted in dcreased GFR  and increased imbalance resulting in falls.  No change today given recent readings at home  being < 140/90 .  Reminded to continue to  monitor readings at home , take the losartan at night and Imdur in the morning    Other orders -     Losartan Potassium; Take 1 tablet (25 mg total) by mouth daily.  Dispense: 90 tablet; Refill: 1     I provided  40 minutes on the day of this  face-to-face  encounter reviewing patient's last visit with me, patient's  most recent  ER visit ,  last nephrology visit.  Last ECHO in 2022.  previous  labs and imaging studies, counseling on currently addressed issues,  and post visit ordering to diagnostics and therapeutics .    Follow-up: Return in about 3 months (around 04/22/2023) for follow up diabetes.   [name], MD

## 2023-01-20 NOTE — Assessment & Plan Note (Signed)
Also aggravated by use of Paxlovid. Improved from August 5 pulse of 47

## 2023-01-20 NOTE — Assessment & Plan Note (Signed)
He had another  mild case in August 2024,  [place] ., which aggravated his tremor and leg weakness .  have reminded him tp  forego any future vaccinations against COVID due to past adverse reactions to the vaccines

## 2023-01-20 NOTE — Assessment & Plan Note (Signed)
Improved GFR with liberalization of Blood pressure . Cr at recent ER visit was 1.77  Lab Results  Component Value Date   CREATININE 1.88 (H) 09/17/2022   Lab Results  Component Value Date   NA 140 09/17/2022   K 4.6 09/17/2022   CL 103 09/17/2022   CO2 28 09/17/2022

## 2023-01-20 NOTE — Assessment & Plan Note (Addendum)
He developed significant bilateral LE weakness after taking Paxlovid for mild COVID reaction resulting in need for cane.  PT referral made.

## 2023-01-20 NOTE — Assessment & Plan Note (Signed)
He reports progressive weakness and recent episode of falling after taking Paxlovid.Marland Kitchen  Referral to  [facility]  PT for strength training

## 2023-01-21 LAB — MICROALBUMIN / CREATININE URINE RATIO
Creatinine,U: 204.8 mg/dL
Microalb Creat Ratio: 5.3 mg/g (ref 0.0–30.0)
Microalb, Ur: 10.9 mg/dL — ABNORMAL HIGH (ref 0.0–1.9)

## 2023-01-27 NOTE — Therapy (Incomplete)
OUTPATIENT PHYSICAL THERAPY NEURO EVALUATION   Patient Name: Johnathan Arnold. 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 LIMA to LAD, SVG to D1, OM1 and RPDA   CORONARY ARTERY BYPASS GRAFT  03/1998   5 vessel, [facility]   RIGHT HEART CATH N/A 04/04/2019   Procedure: RIGHT HEART CATH;  Surgeon: Iran Ouch, MD;  Location: [facility] INVASIVE CV LAB;  Service: Cardiovascular;  Laterality: N/A;   RIGHT/LEFT HEART CATH AND CORONARY ANGIOGRAPHY N/A 02/01/2018   Procedure: RIGHT/LEFT HEART CATH AND CORONARY ANGIOGRAPHY;  Surgeon: Iran Ouch, MD;  Location: [facility] INVASIVE CV LAB;  Service: Cardiovascular;  Laterality: N/A;   Patient Active Problem List   Diagnosis Date Noted   Adverse drug reaction 01/20/2023   History of COVID-19 03/18/2022   History of diabetes mellitus, type II 03/18/2022   Hospital discharge follow-up 05/16/2021   Generalized weakness 05/07/2021   Fall    Head injury    Bradycardia    Anemia, unspecified 02/07/2021   Neutropenia (HCC) 01/29/2021   Thrombocytopenia (HCC) 01/29/2021   Hyponatremia 12/22/2020   Bilateral leg weakness 12/20/2020   Prostate cancer screening 09/08/2020   Mild neurocognitive disorder due to Parkinson's disease 09/06/2020   Low back pain 08/19/2019   Pulmonary hypertension (HCC)    Insomnia 12/21/2018   Sleep apnea in adult 12/09/2018   Periodic limb movement disorder 12/09/2018   Pulmonary  nodules 05/18/2018   Wears hearing aid in both ears 05/17/2018   Leg pain, bilateral 02/20/2018   Dyspnea    CKD (chronic kidney disease) stage 3, GFR 30-59 ml/min (HCC) 09/21/2017   History of skin cancer in adulthood 08/14/2016   Parkinson's disease 08/09/2016   Bilateral carotid artery stenosis 04/02/2015   Vertigo, peripheral 10/17/2014   Benign prostatic hypertrophy with urinary frequency 01/31/2014   Encounter for Medicare annual wellness exam 07/02/2013   Obesity 04/03/2013   Other malaise and fatigue 09/21/2012   Hyperlipidemia    Hypertension    3-vessel coronary artery disease    S/P CABG x 5     ONSET DATE: ***  REFERRING DIAG: ***  THERAPY DIAG:  No diagnosis found.  Rationale for Evaluation and Treatment: {HABREHAB:27488}  SUBJECTIVE:  SUBJECTIVE STATEMENT: *** Pt accompanied by: {accompnied:27141}  PERTINENT HISTORY: ***  PAIN:  Are you having pain? {OPRCPAIN:27236}  PRECAUTIONS: {Therapy precautions:24002}  RED FLAGS: {PT Red Flags:29287}   WEIGHT BEARING RESTRICTIONS: {Yes ***/No:24003}  FALLS: Has patient fallen in last 6 months? {fallsyesno:27318}  LIVING ENVIRONMENT: Lives with: {OPRC lives with:25569::"lives with their family"} Lives in: {Lives in:25570} Stairs: {opstairs:27293} Has following equipment at home: {Assistive devices:23999}  PLOF: {PLOF:24004}  PATIENT GOALS: ***  OBJECTIVE:   DIAGNOSTIC FINDINGS: ***  COGNITION: Overall cognitive status: {cognition:24006}   SENSATION: {sensation:27233}  COORDINATION: ***  EDEMA:  {edema:24020}  MUSCLE TONE: {LE tone:25568}  MUSCLE LENGTH: Hamstrings: Right *** deg; Left *** deg Thomas test: Right *** deg; Left *** deg  DTRs:  {DTR SITE:24025}  POSTURE: {posture:25561}  LOWER  EXTREMITY ROM:     {AROM/PROM:27142}  Right Eval Left Eval  Hip flexion    Hip extension    Hip abduction    Hip adduction    Hip internal rotation    Hip external rotation    Knee flexion    Knee extension    Ankle dorsiflexion    Ankle plantarflexion    Ankle inversion    Ankle eversion     (Blank rows = not tested)  LOWER EXTREMITY MMT:    MMT Right Eval Left Eval  Hip flexion    Hip extension    Hip abduction    Hip adduction    Hip internal rotation    Hip external rotation    Knee flexion    Knee extension    Ankle dorsiflexion    Ankle plantarflexion    Ankle inversion    Ankle eversion    (Blank rows = not tested)  BED MOBILITY:  {Bed mobility:24027}  TRANSFERS: Assistive device utilized: {Assistive devices:23999}  Sit to stand: {Levels of assistance:24026} Stand to sit: {Levels of assistance:24026} Chair to chair: {Levels of assistance:24026} Floor: {Levels of assistance:24026}  RAMP:  Level of Assistance: {Levels of assistance:24026} Assistive device utilized: {Assistive devices:23999} Ramp Comments: ***  CURB:  Level of Assistance: {Levels of assistance:24026} Assistive device utilized: {Assistive devices:23999} Curb Comments: ***  STAIRS: Level of Assistance: {Levels of assistance:24026} Stair Negotiation Technique: {Stair Technique:27161} with {Rail Assistance:27162} Number of Stairs: ***  Height of Stairs: ***  Comments: ***  GAIT: Gait pattern: {gait characteristics:25376} Distance walked: *** Assistive device utilized: {Assistive devices:23999} Level of assistance: {Levels of assistance:24026} Comments: ***  FUNCTIONAL TESTS:  {Functional tests:24029}  PATIENT SURVEYS:  {rehab surveys:24030}  TODAY'S TREATMENT:                                                                                                                              DATE: ***    PATIENT EDUCATION: Education details: *** Person educated: {Person  educated:25204} Education method: {Education Method:25205} Education comprehension: {Education Comprehension:25206}  HOME EXERCISE PROGRAM: ***  GOALS: Goals reviewed with patient? {yes/no:20286}  SHORT TERM GOALS: Target date: ***  ***  Baseline: Goal status: INITIAL  2.  *** Baseline:  Goal status: INITIAL  3.  *** Baseline:  Goal status: INITIAL  4.  *** Baseline:  Goal status: INITIAL  5.  *** Baseline:  Goal status: INITIAL  6.  *** Baseline:  Goal status: INITIAL  LONG TERM GOALS: Target date: ***  *** Baseline:  Goal status: INITIAL  2.  *** Baseline:  Goal status: INITIAL  3.  *** Baseline:  Goal status: INITIAL  4.  *** Baseline:  Goal status: INITIAL  5.  *** Baseline:  Goal status: INITIAL  6.  *** Baseline:  Goal status: INITIAL  ASSESSMENT:  CLINICAL IMPRESSION: Patient is a *** y.o. *** who was seen today for physical therapy evaluation and treatment for ***.   OBJECTIVE IMPAIRMENTS: {opptimpairments:25111}.   ACTIVITY LIMITATIONS: {activitylimitations:27494}  PARTICIPATION LIMITATIONS: {participationrestrictions:25113}  PERSONAL FACTORS: {Personal factors:25162} are also affecting patient's functional outcome.   REHAB POTENTIAL: {rehabpotential:25112}  CLINICAL DECISION MAKING: {clinical decision making:25114}  EVALUATION COMPLEXITY: {Evaluation complexity:25115}  PLAN:  PT FREQUENCY: {rehab frequency:25116}  PT DURATION: {rehab duration:25117}  PLANNED INTERVENTIONS: {rehab planned interventions:25118::"Therapeutic exercises","Therapeutic activity","Neuromuscular re-education","Balance training","Gait training","Patient/Family education","Self Care","Joint mobilization"}  PLAN FOR NEXT SESSION: Norman Herrlich, PT 01/27/2023, 11:29 AM

## 2023-01-28 ENCOUNTER — Ambulatory Visit: Payer: Medicare Other | Attending: Internal Medicine | Admitting: Physical Therapy

## 2023-01-28 DIAGNOSIS — R2689 Other abnormalities of gait and mobility: Secondary | ICD-10-CM | POA: Insufficient documentation

## 2023-01-28 DIAGNOSIS — R29898 Other symptoms and signs involving the musculoskeletal system: Secondary | ICD-10-CM | POA: Insufficient documentation

## 2023-01-28 DIAGNOSIS — M6281 Muscle weakness (generalized): Secondary | ICD-10-CM | POA: Diagnosis not present

## 2023-01-28 DIAGNOSIS — R269 Unspecified abnormalities of gait and mobility: Secondary | ICD-10-CM | POA: Diagnosis not present

## 2023-01-28 DIAGNOSIS — R2681 Unsteadiness on feet: Secondary | ICD-10-CM | POA: Diagnosis not present

## 2023-01-28 DIAGNOSIS — R262 Difficulty in walking, not elsewhere classified: Secondary | ICD-10-CM | POA: Insufficient documentation

## 2023-01-28 NOTE — Therapy (Unsigned)
OUTPATIENT PHYSICAL THERAPY NEURO EVALUATION   Patient Name: Johnathan Arnold. MRN: [id number] DOB:Aug 16, 1945, 77 y.o., male Today's Date: 01/28/2023   PCP: Johnathan Shams, MD   REFERRING PROVIDER: [name], MD   END OF SESSION:  PT End of Session - 01/28/23 1107     Visit Number 1    Number of Visits 20    Date for PT Re-Evaluation 02/18/22    Authorization Type Medicare    Progress Note Due on Visit 10    PT Start Time 1103    PT Stop Time 1144    PT Time Calculation (min) 41 min    Activity Tolerance Patient tolerated treatment well    Behavior During Therapy [facility] for tasks assessed/performed             Past Medical History:  Diagnosis Date   3-vessel coronary artery disease    s/p  5 vessel CABG   Diabetes mellitus without complication (HCC)    History of cardiac catheterization 2011   [facility]   Hyperlipidemia    Hypertension    Hypertriglyceridemia    Parkinson's disease    Pneumonia 12/28/2020   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 LIMA to LAD, SVG to D1, OM1 and RPDA   CORONARY ARTERY BYPASS GRAFT  03/1998   5 vessel, [facility]   RIGHT HEART CATH N/A 04/04/2019   Procedure: RIGHT HEART CATH;  Surgeon: Johnathan Ouch, MD;  Location: [facility] INVASIVE CV LAB;  Service: Cardiovascular;  Laterality: N/A;   RIGHT/LEFT HEART CATH AND CORONARY ANGIOGRAPHY N/A 02/01/2018   Procedure: RIGHT/LEFT HEART CATH AND CORONARY ANGIOGRAPHY;  Surgeon: Johnathan Ouch, MD;  Location: [facility] INVASIVE CV LAB;  Service: Cardiovascular;  Laterality: N/A;   Patient Active Problem List   Diagnosis Date Noted   Adverse drug reaction 01/20/2023   History of COVID-19 03/18/2022   History of diabetes mellitus, type II 03/18/2022   Hospital discharge follow-up 05/16/2021   Generalized weakness 05/07/2021   Fall    Head injury    Bradycardia    Anemia,  unspecified 02/07/2021   Neutropenia (HCC) 01/29/2021   Thrombocytopenia (HCC) 01/29/2021   Hyponatremia 12/22/2020   Bilateral leg weakness 12/20/2020   Prostate cancer screening 09/08/2020   Mild neurocognitive disorder due to Parkinson's disease 09/06/2020   Low back pain 08/19/2019   Pulmonary hypertension (HCC)    Insomnia 12/21/2018   Sleep apnea in adult 12/09/2018   Periodic limb movement disorder 12/09/2018   Pulmonary nodules 05/18/2018   Wears hearing aid in both ears 05/17/2018   Leg pain, bilateral 02/20/2018   Dyspnea    CKD (chronic kidney disease) stage 3, GFR 30-59 ml/min (HCC) 09/21/2017   History of skin cancer in adulthood 08/14/2016   Parkinson's disease 08/09/2016   Bilateral carotid artery stenosis 04/02/2015   Vertigo, peripheral 10/17/2014   Benign prostatic hypertrophy with urinary frequency 01/31/2014   Encounter for Medicare annual wellness exam 07/02/2013   Obesity 04/03/2013   Other malaise and fatigue 09/21/2012   Hyperlipidemia    Hypertension    3-vessel coronary artery disease    S/P CABG x 5     ONSET DATE: 01/20/23  REFERRING DIAG: U44.034 (ICD-10-CM) - Weakness of both lower extremities   THERAPY DIAG:  Difficulty in walking, not elsewhere classified  Abnormality of gait and mobility  Unsteadiness  on feet  Muscle weakness (generalized)  Other abnormalities of gait and mobility  Rationale for Evaluation and Treatment: Rehabilitation  SUBJECTIVE:                                                                                                                                                                                             SUBJECTIVE STATEMENT: Pt reports having COVID and having significant weakness.  Pt accompanied by: self  PERTINENT HISTORY: PD, history of COVID and significant weakness present following this diagnosis ( August 5 dx)   PAIN:  Are you having pain? Yes: NPRS scale: 6-7/10 Pain location: lower back   Pain description: pin when standing upright for prolonged periods  Aggravating factors: standing prolonged periods  Relieving factors: sitting completely alleviates pain   PRECAUTIONS: Fall  RED FLAGS: None   WEIGHT BEARING RESTRICTIONS: No  FALLS: Has patient fallen in last 6 months? Yes. Number of falls 2-3  LIVING ENVIRONMENT: Lives with: lives with their family and lives with their spouse Lives in: House/apartment Stairs: Yes: Internal: 15 steps; on left going up and External: 1 steps; none Has following equipment at home: Quad cane large base  PLOF: Independent  PATIENT GOALS: To improve strength to prior to his COVID diagnosis   OBJECTIVE:   DIAGNOSTIC FINDINGS: n/a  COGNITION: Overall cognitive status: Within functional limits for tasks assessed   SENSATION: Not tested      POSTURE: rounded shoulders, forward head, and posterior pelvic tilt   LOWER EXTREMITY MMT:    MMT Right Eval Left Eval  Hip flexion 4 4  Hip extension    Hip abduction 4 4  Hip adduction 4 4  Hip internal rotation    Hip external rotation    Knee flexion 4+ 4+  Knee extension 4+ 4+  Ankle dorsiflexion 4 4  Ankle plantarflexion    Ankle inversion 4 4  Ankle eversion 4 4  (Blank rows = not tested)  BED MOBILITY:  No limitations per pt   TRANSFERS: Assistive device utilized: None  Sit to stand: Complete Independence Stand to sit: Complete Independence Chair to chair: Complete Independence Floor:  Not tested     STAIRS: Level of Assistance: Complete Independence Stair Negotiation Technique: Alternating Pattern  with Bilateral Rails Number of Stairs: 4  Height of Stairs: 6in  Comments:   GAIT: Gait pattern: step through pattern Distance walked: 60 ft Assistive device utilized: None Level of assistance: Complete Independence Comments:   FUNCTIONAL TESTS:  Pt scores *** / 28 on mini BEST balance test. Scores < 16 indicate increased risk for falls Johnathan Arnold,  Johnathan Arnold, & Johnathan Arnold)  and MCID is 4 (Johnathan Arnold,et al, 2013)    [name] PT Assessment - 01/28/23 0001       Standardized Balance Assessment   Standardized Balance Assessment Mini-BESTest      Mini-BESTest   Sit To Stand Normal: Comes to stand without use of hands and stabilizes independently.    Rise to Toes Moderate: Heels up, but not full range (smaller than when holding hands), OR noticeable instability for 3 s.    Stand on one leg (left) Moderate: < 20 s    Stand on one leg (right) Moderate: < 20 s    Stand on one leg - lowest score 1    Compensatory Stepping Correction - Forward Normal: Recovers independently with a single, large step (second realignement is allowed).    Compensatory Stepping Correction - Backward Moderate: More than one step is required to recover equilibrium    Compensatory Stepping Correction - Left Lateral Normal: Recovers independently with 1 step (crossover or lateral OK)    Compensatory Stepping Correction - Right Lateral Moderate: Several steps to recover equilibrium    Stepping Corredtion Lateral - lowest score 1    Stance - Feet together, eyes open, firm surface  Normal: 30s    Stance - Feet together, eyes closed, foam surface  Moderate: < 30s    Incline - Eyes Closed Normal: Stands independently 30s and aligns with gravity    Change in Gait Speed Normal: Significantly changes walkling speed without imbalance    Walk with head turns - Horizontal Normal: performs head turns with no change in gait speed and good balance    Walk with pivot turns Moderate:Turns with feet close SLOW (>4 steps) with good balance.    Step over obstacles Moderate: Steps over box but touches box OR displays cautious behavior by slowing gait.    Timed UP & GO with Dual Task Normal: No noticeable change in sitting, standing or walking while backward counting when compared to TUG without    Mini-BEST total score 21             10 Meter Walk Test: Patient instructed to walk 10 meters (32.8  ft) as quickly and as safely as possible at their normal speed x2 Time measured from 2 meter mark to 8 meter mark to accommodate ramp-up and ramp-down.  Normal speed 1: 1 m/s Normal speed 2: 1.01 m/s Average Normal speed: 1 m/s  Cut off scores: <0.4 m/s = household Ambulator, 0.4-0.8 m/s = limited community Ambulator, >0.8 m/s = community Ambulator, >1.2 m/s = crossing a street, <1.0 = increased fall risk MCID 0.05 m/s (small), 0.13 m/s (moderate) (ANPTA Core Set of Outcome Measures for Adults with Neurologic Conditions, 2018)  Five times Sit to Stand Test (FTSS)  TIME: 11.16 sec  Cut off scores indicative of increased fall risk: >12 sec CVA, >16 sec PD, >13 sec vestibular (ANPTA Core Set of Outcome Measures for Adults with Neurologic Conditions, 2018)   PATIENT SURVEYS:  53  TODAY'S TREATMENT:  DATE: Eval Only     PATIENT EDUCATION: Education details: POC  Person educated: Patient Education method: Explanation Education comprehension: verbalized understanding  HOME EXERCISE PROGRAM: Establish visit 2,   GOALS: Goals reviewed with patient? Yes  SHORT TERM GOALS: Target date: 02/25/2023       Patient will be independent in home exercise program to improve strength/mobility for better functional independence with ADLs. Baseline: No HEP currently  Goal status: INITIAL   LONG TERM GOALS: Target date: 03/25/2023      1.  Patient will increase FOTO score to equal to or greater than  62   to demonstrate statistically significant improvement in mobility and quality of life.  Baseline: 53 Goal status: INITIAL   2.  Patient will increase Mini BEST Balance score by > 4  points to demonstrate decreased fall risk during functional activities. Baseline: 21 Goal status: INITIAL   3.   Patient will improve LE muscle strength to > 4+/5 in hip, knee  and ankle strength in order to indicate return to prior level of LE strength  Baseline: see eval chart  Goal status: INITIAL  ***.   Patient will increase 10 meter walk test to >1.43m/s as to improve gait speed for better community ambulation and to reduce fall risk. Baseline: *** Goal status: INITIAL  ***.   Patient will increase six minute walk test distance to >1000 for progression to community ambulator and improve gait ability Baseline: *** Goal status: INITIAL    ASSESSMENT:  CLINICAL IMPRESSION: Patient is a *** y.o. *** who was seen today for physical therapy evaluation and treatment for ***.   OBJECTIVE IMPAIRMENTS: decreased activity tolerance, decreased balance, decreased mobility, difficulty walking, and decreased strength.   ACTIVITY LIMITATIONS: standing, squatting, stairs, and locomotion level  PARTICIPATION LIMITATIONS: community activity  PERSONAL FACTORS: {Personal factors:25162} are also affecting patient's functional outcome.   REHAB POTENTIAL: Good  CLINICAL DECISION MAKING: Stable/uncomplicated  EVALUATION COMPLEXITY: Low  PLAN:  PT FREQUENCY: 2x/week  PT DURATION: 12 weeks  PLANNED INTERVENTIONS: {rehab planned interventions:25118::"Therapeutic exercises","Therapeutic activity","Neuromuscular re-education","Balance training","Gait training","Patient/Family education","Self Care","Joint mobilization"}  PLAN FOR NEXT SESSION: ***   Norman Herrlich, PT 01/28/2023, 11:10 AM

## 2023-01-30 ENCOUNTER — Encounter: Payer: Self-pay | Admitting: Internal Medicine

## 2023-01-30 ENCOUNTER — Ambulatory Visit: Payer: Medicare Other | Admitting: Physical Therapy

## 2023-01-30 ENCOUNTER — Encounter: Payer: Self-pay | Admitting: Physical Therapy

## 2023-01-30 DIAGNOSIS — R262 Difficulty in walking, not elsewhere classified: Secondary | ICD-10-CM

## 2023-01-30 DIAGNOSIS — R269 Unspecified abnormalities of gait and mobility: Secondary | ICD-10-CM

## 2023-01-30 DIAGNOSIS — R2681 Unsteadiness on feet: Secondary | ICD-10-CM

## 2023-01-30 DIAGNOSIS — R29898 Other symptoms and signs involving the musculoskeletal system: Secondary | ICD-10-CM | POA: Diagnosis not present

## 2023-01-30 DIAGNOSIS — R2689 Other abnormalities of gait and mobility: Secondary | ICD-10-CM

## 2023-01-30 DIAGNOSIS — M6281 Muscle weakness (generalized): Secondary | ICD-10-CM | POA: Diagnosis not present

## 2023-01-30 NOTE — Therapy (Signed)
OUTPATIENT PHYSICAL THERAPY NEURO TREATMENT   Patient Name: Johnathan Arnold. MRN: [id number] DOB:18-Jun-1945, 77 y.o., male Today's Date: 01/30/2023   PCP: [name], MD   REFERRING PROVIDER: [name], MD   END OF SESSION:  PT End of Session - 01/30/23 1105     Visit Number 2    Number of Visits 16    Date for PT Re-Evaluation 03/25/23    Authorization Type Medicare    Progress Note Due on Visit 10    PT Start Time 0930    PT Stop Time 1015    PT Time Calculation (min) 45 min    Activity Tolerance Patient tolerated treatment well    Behavior During Therapy [facility] for tasks assessed/performed              Past Medical History:  Diagnosis Date   3-vessel coronary artery disease    s/p  5 vessel CABG   Diabetes mellitus without complication (HCC)    History of cardiac catheterization 2011   [facility]   Hyperlipidemia    Hypertension    Hypertriglyceridemia    Parkinson's disease    Pneumonia 12/28/2020   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 LIMA to LAD, SVG to D1, OM1 and RPDA   CORONARY ARTERY BYPASS GRAFT  03/1998   5 vessel, [facility]   RIGHT HEART CATH N/A 04/04/2019   Procedure: RIGHT HEART CATH;  Surgeon: Iran Ouch, MD;  Location: [facility] INVASIVE CV LAB;  Service: Cardiovascular;  Laterality: N/A;   RIGHT/LEFT HEART CATH AND CORONARY ANGIOGRAPHY N/A 02/01/2018   Procedure: RIGHT/LEFT HEART CATH AND CORONARY ANGIOGRAPHY;  Surgeon: Iran Ouch, MD;  Location: [facility] INVASIVE CV LAB;  Service: Cardiovascular;  Laterality: N/A;   Patient Active Problem List   Diagnosis Date Noted   Adverse drug reaction 01/20/2023   History of COVID-19 03/18/2022   History of diabetes mellitus, type II 03/18/2022   Hospital discharge follow-up 05/16/2021   Generalized weakness 05/07/2021   Fall    Head injury    Bradycardia    Anemia,  unspecified 02/07/2021   Neutropenia (HCC) 01/29/2021   Thrombocytopenia (HCC) 01/29/2021   Hyponatremia 12/22/2020   Bilateral leg weakness 12/20/2020   Prostate cancer screening 09/08/2020   Mild neurocognitive disorder due to Parkinson's disease 09/06/2020   Low back pain 08/19/2019   Pulmonary hypertension (HCC)    Insomnia 12/21/2018   Sleep apnea in adult 12/09/2018   Periodic limb movement disorder 12/09/2018   Pulmonary nodules 05/18/2018   Wears hearing aid in both ears 05/17/2018   Leg pain, bilateral 02/20/2018   Dyspnea    CKD (chronic kidney disease) stage 3, GFR 30-59 ml/min (HCC) 09/21/2017   History of skin cancer in adulthood 08/14/2016   Parkinson's disease 08/09/2016   Bilateral carotid artery stenosis 04/02/2015   Vertigo, peripheral 10/17/2014   Benign prostatic hypertrophy with urinary frequency 01/31/2014   Encounter for Medicare annual wellness exam 07/02/2013   Obesity 04/03/2013   Other malaise and fatigue 09/21/2012   Hyperlipidemia    Hypertension    3-vessel coronary artery disease    S/P CABG x 5     ONSET DATE: 01/20/23  REFERRING DIAG: B14.782 (ICD-10-CM) - Weakness of both lower extremities   THERAPY DIAG:  Difficulty in walking, not elsewhere classified  Abnormality of gait and mobility  Unsteadiness on feet  Muscle weakness (generalized)  Other abnormalities of gait and mobility  Rationale for Evaluation and Treatment: Rehabilitation  SUBJECTIVE:                                                                                                                                                                                             SUBJECTIVE STATEMENT: Patient reports no significant changes since last session.  Patient reports no back pain at the initiation of today's session.  Pt accompanied by: self  PERTINENT HISTORY: PD, history of COVID and significant weakness present following this diagnosis and the use of paxlovid (  August 5 dx date, thinks Paxlovid may have exacerbated PD symptoms)   Pt reports having COVID and having significant weakness.  Patient reports he also has back pain that is not present for a long time but was exacerbated more recently.  Patient previously did physical therapy for his back and experienced some relief but has not been consistent with the exercises.  Patient also has history of going to Parkinson's rock steady classes multiple times per week prior to onset of his COVID but he has not been back since. Patient previously ambulated without an assistive device but is now ambulating with a straight point cane.  Patient reports increased foot and ankle weakness in comparison with his hips and knees.  Patient also reports low back pain that is exacerbated with prolonged standing. PAIN:  Are you having pain? Yes: NPRS scale: 6-7/10 Pain location: lower back  Pain description: pin when standing upright for prolonged periods  Aggravating factors: standing prolonged periods  Relieving factors: sitting completely alleviates pain   PRECAUTIONS: Fall  RED FLAGS: None   WEIGHT BEARING RESTRICTIONS: No  FALLS: Has patient fallen in last 6 months? Yes. Number of falls 2-3  LIVING ENVIRONMENT: Lives with: lives with their family and lives with their spouse Lives in: House/apartment Stairs: Yes: Internal: 15 steps; on left going up and External: 1 steps; none Has following equipment at home: Quad cane large base  PLOF: Independent  PATIENT GOALS: To improve strength to prior to his COVID diagnosis   OBJECTIVE:   DIAGNOSTIC FINDINGS: n/a  COGNITION: Overall cognitive status: Within functional limits for tasks assessed   SENSATION: Not tested      POSTURE: rounded shoulders, forward head, and posterior pelvic tilt   LOWER EXTREMITY MMT:    MMT Right Eval Left Eval  Hip flexion 4 4  Hip extension    Hip abduction 4 4  Hip adduction 4 4  Hip internal rotation    Hip  external rotation  Knee flexion 4+ 4+  Knee extension 4+ 4+  Ankle dorsiflexion 4 4  Ankle plantarflexion    Ankle inversion 4 4  Ankle eversion 4 4  (Blank rows = not tested)  BED MOBILITY:  No limitations per pt   TRANSFERS: Assistive device utilized: None  Sit to stand: Complete Independence Stand to sit: Complete Independence Chair to chair: Complete Independence Floor:  Not tested     STAIRS: Level of Assistance: Complete Independence Stair Negotiation Technique: Alternating Pattern  with Bilateral Rails Number of Stairs: 4  Height of Stairs: 6in  Comments:   GAIT: Gait pattern: step through pattern Distance walked: 60 ft Assistive device utilized: None Level of assistance: Complete Independence Comments:   FUNCTIONAL TESTS:  Pt scores 21 / 28 on mini BEST balance test. Scores < 16 indicate increased risk for falls Para [place], [place], & [place]) and MCID is 4 (Godi,et al, 2013)      10 Meter Walk Test: Patient instructed to walk 10 meters (32.8 ft) as quickly and as safely as possible at their normal speed x2 Time measured from 2 meter mark to 8 meter mark to accommodate ramp-up and ramp-down.  Normal speed 1: 1 m/s Normal speed 2: 1.01 m/s Average Normal speed: 1 m/s  Cut off scores: <0.4 m/s = household Ambulator, 0.4-0.8 m/s = limited community Ambulator, >0.8 m/s = community Ambulator, >1.2 m/s = crossing a street, <1.0 = increased fall risk MCID 0.05 m/s (small), 0.13 m/s (moderate) (ANPTA Core Set of Outcome Measures for Adults with Neurologic Conditions, 2018)  Five times Sit to Stand Test (FTSS)  TIME: 11.16 sec  Cut off scores indicative of increased fall risk: >12 sec CVA, >16 sec PD, >13 sec vestibular (ANPTA Core Set of Outcome Measures for Adults with Neurologic Conditions, 2018)   PATIENT SURVEYS:  FOTO: 53  TODAY'S TREATMENT:                                                                                                                               DATE:  : 890 ft in 5 minutes, througohut test but more profoundly at 4 minutes pt began to have stooped kyphotic posture. This worsened and was followed by back pain that caused the test to be suspended at 5 minutes.  PWR! Up works on postural strengthening and antigravity extension, PRW! Rock working on [employer] shifting, PRW! Twist targeting trunk rotation and PRW! Step targeting transition movements. PWR! Moves target bradykinesia, rigidity, and dyskinesia through targeted functional movements that address four core movement difficulties for people with Parkinson's disease. NMR Seated PWR! Up x 15 reps  Seated PWR! Twist with focus on postural activation each rep  Standing PWR! Up  TE Standing shoulder extensions 2 x 10, cues for scapular muscle activaiton Blue TB Standing row 2 x 10 Blue TB Seated horizontal ABD 2 x 12 with RTB Seated Bilateral ER with RTB x10 NMR Standing PWR! [address], Standing PWR! Step  x 10   PATIENT EDUCATION: Education details: POC  Person educated: Patient Education method: Explanation Education comprehension: verbalized understanding  HOME EXERCISE PROGRAM: Access Code: [facility] URL: [email or web address] Date: 01/30/2023 Prepared by: Thresa Ross  Exercises - Shoulder extension with resistance - Neutral  - 1 x daily - 7 x weekly - 2 sets - 12 reps - Standing Shoulder Row with Anchored Resistance  - 1 x daily - 7 x weekly - 2 sets - 15 reps - Shoulder External Rotation and Scapular Retraction with Resistance  - 1 x daily - 7 x weekly - 2 sets - 10 reps - Seated Shoulder Horizontal Abduction with Resistance - Palms Down  - 1 x daily - 7 x weekly - 2 sets - 12 reps -Standing PWR! Moves x 10 ea (UP, ROCK, TWIST,STEP)  GOALS: Goals reviewed with patient? Yes  SHORT TERM GOALS: Target date: 02/25/2023       Patient will be independent in home exercise program to improve strength/mobility for better functional  independence with ADLs. Baseline: No HEP currently  Goal status: INITIAL   LONG TERM GOALS: Target date: 03/25/2023      1.  Patient will increase FOTO score to equal to or greater than  62   to demonstrate statistically significant improvement in mobility and quality of life.  Baseline: 53 Goal status: INITIAL   2.  Patient will increase Mini BEST Balance score by > 4  points to demonstrate decreased fall risk during functional activities. Baseline: 21 Goal status: INITIAL   3.   Patient will improve LE muscle strength to > 4+/5 in hip, knee and ankle strength in order to indicate return to prior level of LE strength  Baseline: see eval chart  Goal status: INITIAL  5.   Patient will increase six minute walk test distance to >1100 with pain less than 5/10 throughout for progression to community ambulator and improve gait ability without discomfort Baseline: 890 ft in 5 minutes, througohut test but more profoundly at 4 minutes pt began to have stooped kyphotic posture. This worsened and was followed by back pain (6/10) that caused the test to be suspended at 5 minutes.  Goal status: INITIAL    ASSESSMENT:  CLINICAL IMPRESSION: Patient presents with good motivation for completion of physical therapy activities.  Patient began with 6-minute walk test.  Throughout walk test patient displayed increased kyphotic posture which eventually resulted in back pain.  This is likely due to mechanical force but on low back with greater amounts of kyphosis.  Patient was introduced to several exercises to improve his truncal and posterior chain stability and endurance with home exercise program was also introduced to Parkinson's wellness recovery with specific cues for postural focus.  Patient provided with HEP handout as well as therapy band to complete the above activities pt will continue to benefit from skilled physical therapy intervention to address impairments, improve QOL, and attain therapy  goals.     OBJECTIVE IMPAIRMENTS: decreased activity tolerance, decreased balance, decreased mobility, difficulty walking, and decreased strength.   ACTIVITY LIMITATIONS: standing, squatting, stairs, and locomotion level  PARTICIPATION LIMITATIONS: community activity  PERSONAL FACTORS: 3+ comorbidities: PD, HLD, HTN, Back pain  are also affecting patient's functional outcome.   REHAB POTENTIAL: Good  CLINICAL DECISION MAKING: Stable/uncomplicated  EVALUATION COMPLEXITY: Low  PLAN:  PT FREQUENCY: 2x/week  PT DURATION: 12 weeks  PLANNED INTERVENTIONS: Therapeutic exercises, Therapeutic activity, Neuromuscular re-education, Balance training, Gait training, Patient/Family education, Self Care, Joint mobilization, Joint manipulation, Spinal  manipulation, Spinal mobilization, Moist heat, and Manual therapy  PLAN FOR NEXT SESSION: Review low back HEP from discharge at sports PT, begin POC for endurance and balance, with monitor of back pain throughout and update goal   Norman Herrlich, PT 01/30/2023, 11:06 AM

## 2023-02-05 ENCOUNTER — Other Ambulatory Visit: Payer: Self-pay | Admitting: Neurology

## 2023-02-05 DIAGNOSIS — G20A1 Parkinson's disease without dyskinesia, without mention of fluctuations: Secondary | ICD-10-CM

## 2023-02-06 ENCOUNTER — Ambulatory Visit: Payer: Medicare Other | Admitting: Physical Therapy

## 2023-02-06 DIAGNOSIS — R2689 Other abnormalities of gait and mobility: Secondary | ICD-10-CM

## 2023-02-06 DIAGNOSIS — R269 Unspecified abnormalities of gait and mobility: Secondary | ICD-10-CM | POA: Diagnosis not present

## 2023-02-06 DIAGNOSIS — R262 Difficulty in walking, not elsewhere classified: Secondary | ICD-10-CM | POA: Diagnosis not present

## 2023-02-06 DIAGNOSIS — M6281 Muscle weakness (generalized): Secondary | ICD-10-CM

## 2023-02-06 DIAGNOSIS — R2681 Unsteadiness on feet: Secondary | ICD-10-CM

## 2023-02-06 DIAGNOSIS — R29898 Other symptoms and signs involving the musculoskeletal system: Secondary | ICD-10-CM | POA: Diagnosis not present

## 2023-02-06 NOTE — Therapy (Signed)
OUTPATIENT PHYSICAL THERAPY NEURO TREATMENT   Patient Name: Johnathan Arnold. MRN: [id number] DOB:[birth year], 77 y.o., male Today's Date: 02/06/2023   PCP: [name], MD   REFERRING PROVIDER: [name], MD   END OF SESSION:  PT End of Session - 02/06/23 0805     Visit Number 3    Number of Visits 16    Date for PT Re-Evaluation 03/25/23    Authorization Type Medicare    Progress Note Due on Visit 10    PT Start Time 0800    PT Stop Time 0843    PT Time Calculation (min) 43 min    Activity Tolerance Patient tolerated treatment well    Behavior During Therapy [facility] for tasks assessed/performed              Past Medical History:  Diagnosis Date   3-vessel coronary artery disease    s/p  5 vessel CABG   Diabetes mellitus without complication (HCC)    History of cardiac catheterization 2011   [facility]   Hyperlipidemia    Hypertension    Hypertriglyceridemia    Parkinson's disease    Pneumonia 12/28/2020   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 LIMA to LAD, SVG to D1, OM1 and RPDA   CORONARY ARTERY BYPASS GRAFT  03/1998   5 vessel, [facility]   RIGHT HEART CATH N/A 04/04/2019   Procedure: RIGHT HEART CATH;  Surgeon: Iran Ouch, MD;  Location: [facility] INVASIVE CV LAB;  Service: Cardiovascular;  Laterality: N/A;   RIGHT/LEFT HEART CATH AND CORONARY ANGIOGRAPHY N/A 02/01/2018   Procedure: RIGHT/LEFT HEART CATH AND CORONARY ANGIOGRAPHY;  Surgeon: Iran Ouch, MD;  Location: [facility] INVASIVE CV LAB;  Service: Cardiovascular;  Laterality: N/A;   Patient Active Problem List   Diagnosis Date Noted   Adverse drug reaction 01/20/2023   History of COVID-19 03/18/2022   History of diabetes mellitus, type II 03/18/2022   Hospital discharge follow-up 05/16/2021   Generalized weakness 05/07/2021   Fall    Head injury    Bradycardia    Anemia,  unspecified 02/07/2021   Neutropenia (HCC) 01/29/2021   Thrombocytopenia (HCC) 01/29/2021   Hyponatremia 12/22/2020   Bilateral leg weakness 12/20/2020   Prostate cancer screening 09/08/2020   Mild neurocognitive disorder due to Parkinson's disease 09/06/2020   Low back pain 08/19/2019   Pulmonary hypertension (HCC)    Insomnia 12/21/2018   Sleep apnea in adult 12/09/2018   Periodic limb movement disorder 12/09/2018   Pulmonary nodules 05/18/2018   Wears hearing aid in both ears 05/17/2018   Leg pain, bilateral 02/20/2018   Dyspnea    CKD (chronic kidney disease) stage 3, GFR 30-59 ml/min (HCC) 09/21/2017   History of skin cancer in adulthood 08/14/2016   Parkinson's disease 08/09/2016   Bilateral carotid artery stenosis 04/02/2015   Vertigo, peripheral 10/17/2014   Benign prostatic hypertrophy with urinary frequency 01/31/2014   Encounter for Medicare annual wellness exam 07/02/2013   Obesity 04/03/2013   Other malaise and fatigue 09/21/2012   Hyperlipidemia    Hypertension    3-vessel coronary artery disease    S/P CABG x 5     ONSET DATE: 01/20/23  REFERRING DIAG: W29.562 (ICD-10-CM) - Weakness of both lower extremities   THERAPY DIAG:  Difficulty in walking, not elsewhere classified  Abnormality of gait and mobility  Unsteadiness on feet  Muscle weakness (generalized)  Other abnormalities of gait and mobility  Rationale for Evaluation and Treatment: Rehabilitation  SUBJECTIVE:                                                                                                                                                                                             SUBJECTIVE STATEMENT: Patient reports no significant changes since last session.  Patient reports no back pain at the initiation of today's session.  Pt accompanied by: self  PERTINENT HISTORY: PD, history of COVID and significant weakness present following this diagnosis and the use of paxlovid (  August 5 dx date, thinks Paxlovid may have exacerbated PD symptoms)   Pt reports having COVID and having significant weakness.  Patient reports he also has back pain that is not present for a long time but was exacerbated more recently.  Patient previously did physical therapy for his back and experienced some relief but has not been consistent with the exercises.  Patient also has history of going to Parkinson's rock steady classes multiple times per week prior to onset of his COVID but he has not been back since. Patient previously ambulated without an assistive device but is now ambulating with a straight point cane.  Patient reports increased foot and ankle weakness in comparison with his hips and knees.  Patient also reports low back pain that is exacerbated with prolonged standing. PAIN:  Are you having pain? Yes: NPRS scale: 6-7/10 Pain location: lower back  Pain description: pin when standing upright for prolonged periods  Aggravating factors: standing prolonged periods  Relieving factors: sitting completely alleviates pain   PRECAUTIONS: Fall  RED FLAGS: None   WEIGHT BEARING RESTRICTIONS: No  FALLS: Has patient fallen in last 6 months? Yes. Number of falls 2-3  LIVING ENVIRONMENT: Lives with: lives with their family and lives with their spouse Lives in: House/apartment Stairs: Yes: Internal: 15 steps; on left going up and External: 1 steps; none Has following equipment at home: Quad cane large base  PLOF: Independent  PATIENT GOALS: To improve strength to prior to his COVID diagnosis   OBJECTIVE:   DIAGNOSTIC FINDINGS: n/a  COGNITION: Overall cognitive status: Within functional limits for tasks assessed   SENSATION: Not tested      POSTURE: rounded shoulders, forward head, and posterior pelvic tilt   LOWER EXTREMITY MMT:    MMT Right Eval Left Eval  Hip flexion 4 4  Hip extension    Hip abduction 4 4  Hip adduction 4 4  Hip internal rotation    Hip  external rotation  Knee flexion 4+ 4+  Knee extension 4+ 4+  Ankle dorsiflexion 4 4  Ankle plantarflexion    Ankle inversion 4 4  Ankle eversion 4 4  (Blank rows = not tested)  BED MOBILITY:  No limitations per pt   TRANSFERS: Assistive device utilized: None  Sit to stand: Complete Independence Stand to sit: Complete Independence Chair to chair: Complete Independence Floor:  Not tested     STAIRS: Level of Assistance: Complete Independence Stair Negotiation Technique: Alternating Pattern  with Bilateral Rails Number of Stairs: 4  Height of Stairs: 6in  Comments:   GAIT: Gait pattern: step through pattern Distance walked: 60 ft Assistive device utilized: None Level of assistance: Complete Independence Comments:   FUNCTIONAL TESTS:  Pt scores 21 / 28 on mini BEST balance test. Scores < 16 indicate increased risk for falls [place], [place], & [place]) and MCID is 4 (Godi,et al, 2013)      10 Meter Walk Test: Patient instructed to walk 10 meters (32.8 ft) as quickly and as safely as possible at their normal speed x2 Time measured from 2 meter mark to 8 meter mark to accommodate ramp-up and ramp-down.  Normal speed 1: 1 m/s Normal speed 2: 1.01 m/s Average Normal speed: 1 m/s  Cut off scores: <0.4 m/s = household Ambulator, 0.4-0.8 m/s = limited community Ambulator, >0.8 m/s = community Ambulator, >1.2 m/s = crossing a street, <1.0 = increased fall risk MCID 0.05 m/s (small), 0.13 m/s (moderate) (ANPTA Core Set of Outcome Measures for Adults with Neurologic Conditions, 2018)  Five times Sit to Stand Test (FTSS)  TIME: 11.16 sec  Cut off scores indicative of increased fall risk: >12 sec CVA, >16 sec PD, >13 sec vestibular (ANPTA Core Set of Outcome Measures for Adults with Neurologic Conditions, 2018)   PATIENT SURVEYS:  FOTO: 53  TODAY'S TREATMENT:                                                                                                                               DATE: TE Octane fitness level 3 x 6 min for aerobic priming and for reciprocal movement training  NMR Seated PWR! Up x 15 with 5 sec hold at end range to target antigravity extension.   Standing on airex beam PWR! Up holding YTB for horizontal abduction resistance 2 x 10 reps with 5 sec hold in antigravity extension   TE TRX row 2 x 10, about 45 degree in end point of row   Trx postural extension exercise ( step forward facing away from anchor point and allow arms to go into horizontal abduction) 2 x 10   Deltoid row on matrix cable machine 2 x 10 reps at 2.5# ea UE   NMR Stance on airex beam PWR! Twist x 10 ea side   TE Deadlift with KB on 24 inch block, practice reps without weight for ensured proper form. 2 x 10 once form was adequate for  safe and effective completion   PATIENT EDUCATION: Education details: POC  Person educated: Patient Education method: Explanation Education comprehension: verbalized understanding  HOME EXERCISE PROGRAM: Access Code: [facility] URL: [email or web address] Date: 01/30/2023 Prepared by: Thresa Ross  Exercises - Shoulder extension with resistance - Neutral  - 1 x daily - 7 x weekly - 2 sets - 12 reps - Standing Shoulder Row with Anchored Resistance  - 1 x daily - 7 x weekly - 2 sets - 15 reps - Shoulder External Rotation and Scapular Retraction with Resistance  - 1 x daily - 7 x weekly - 2 sets - 10 reps - Seated Shoulder Horizontal Abduction with Resistance - Palms Down  - 1 x daily - 7 x weekly - 2 sets - 12 reps -Standing PWR! Moves x 10 ea (UP, ROCK, TWIST,STEP)  GOALS: Goals reviewed with patient? Yes  SHORT TERM GOALS: Target date: 02/25/2023       Patient will be independent in home exercise program to improve strength/mobility for better functional independence with ADLs. Baseline: No HEP currently  Goal status: INITIAL   LONG TERM GOALS: Target date: 03/25/2023      1.  Patient will  increase FOTO score to equal to or greater than  62   to demonstrate statistically significant improvement in mobility and quality of life.  Baseline: 53 Goal status: INITIAL   2.  Patient will increase Mini BEST Balance score by > 4  points to demonstrate decreased fall risk during functional activities. Baseline: 21 Goal status: INITIAL   3.   Patient will improve LE muscle strength to > 4+/5 in hip, knee and ankle strength in order to indicate return to prior level of LE strength  Baseline: see eval chart  Goal status: INITIAL  5.   Patient will increase six minute walk test distance to >1100 with pain less than 5/10 throughout for progression to community ambulator and improve gait ability without discomfort Baseline: 890 ft in 5 minutes, througohut test but more profoundly at 4 minutes pt began to have stooped kyphotic posture. This worsened and was followed by back pain (6/10) that caused the test to be suspended at 5 minutes.  Goal status: INITIAL    ASSESSMENT:  CLINICAL IMPRESSION:  Patient arrived with good motivation form completion of pt activities.  Pt continues with PD specific activities to target antigravity extension as well as general exercise to improve target muscle groups. Interventions involving prolonged standing while focussing on posture did not cause increase in back pain that pt subjectively has with prolonged standing activities. Pt will continue to benefit from skilled physical therapy intervention to address impairments, improve QOL, and attain therapy goals.       OBJECTIVE IMPAIRMENTS: decreased activity tolerance, decreased balance, decreased mobility, difficulty walking, and decreased strength.   ACTIVITY LIMITATIONS: standing, squatting, stairs, and locomotion level  PARTICIPATION LIMITATIONS: community activity  PERSONAL FACTORS: 3+ comorbidities: PD, HLD, HTN, Back pain  are also affecting patient's functional outcome.   REHAB POTENTIAL:  Good  CLINICAL DECISION MAKING: Stable/uncomplicated  EVALUATION COMPLEXITY: Low  PLAN:  PT FREQUENCY: 2x/week  PT DURATION: 12 weeks  PLANNED INTERVENTIONS: Therapeutic exercises, Therapeutic activity, Neuromuscular re-education, Balance training, Gait training, Patient/Family education, Self Care, Joint mobilization, Joint manipulation, Spinal manipulation, Spinal mobilization, Moist heat, and Manual therapy  PLAN FOR NEXT SESSION: Antigravity extension activities to improve posture with prolonged ambulation, PD specific exercises with balance focus and postural focus.    Norman Herrlich, PT 02/06/2023, 8:07 AM

## 2023-02-10 DIAGNOSIS — N1832 Chronic kidney disease, stage 3b: Secondary | ICD-10-CM | POA: Diagnosis not present

## 2023-02-10 DIAGNOSIS — D631 Anemia in chronic kidney disease: Secondary | ICD-10-CM | POA: Diagnosis not present

## 2023-02-10 DIAGNOSIS — I129 Hypertensive chronic kidney disease with stage 1 through stage 4 chronic kidney disease, or unspecified chronic kidney disease: Secondary | ICD-10-CM | POA: Diagnosis not present

## 2023-02-11 ENCOUNTER — Ambulatory Visit: Payer: Medicare Other | Attending: Internal Medicine | Admitting: Physical Therapy

## 2023-02-11 DIAGNOSIS — R278 Other lack of coordination: Secondary | ICD-10-CM | POA: Insufficient documentation

## 2023-02-11 DIAGNOSIS — R29898 Other symptoms and signs involving the musculoskeletal system: Secondary | ICD-10-CM | POA: Insufficient documentation

## 2023-02-11 DIAGNOSIS — M6281 Muscle weakness (generalized): Secondary | ICD-10-CM | POA: Insufficient documentation

## 2023-02-11 DIAGNOSIS — R269 Unspecified abnormalities of gait and mobility: Secondary | ICD-10-CM | POA: Insufficient documentation

## 2023-02-11 DIAGNOSIS — R2689 Other abnormalities of gait and mobility: Secondary | ICD-10-CM | POA: Diagnosis not present

## 2023-02-11 DIAGNOSIS — M5459 Other low back pain: Secondary | ICD-10-CM | POA: Insufficient documentation

## 2023-02-11 DIAGNOSIS — R2681 Unsteadiness on feet: Secondary | ICD-10-CM | POA: Diagnosis not present

## 2023-02-11 DIAGNOSIS — R262 Difficulty in walking, not elsewhere classified: Secondary | ICD-10-CM | POA: Insufficient documentation

## 2023-02-11 NOTE — Therapy (Signed)
OUTPATIENT PHYSICAL THERAPY NEURO TREATMENT   Patient Name: Johnathan Arnold. MRN: [id number] DOB:[birth year], 77 y.o., male Today's Date: 02/11/2023   PCP: [name], MD   REFERRING PROVIDER: [name], MD   END OF SESSION:  PT End of Session - 02/11/23 1104     Visit Number 4    Number of Visits 16    Date for PT Re-Evaluation 03/25/23    Authorization Type Medicare    Progress Note Due on Visit 10    PT Start Time 1105    PT Stop Time 1145    PT Time Calculation (min) 40 min    Activity Tolerance Patient tolerated treatment well    Behavior During Therapy [facility] for tasks assessed/performed              Past Medical History:  Diagnosis Date   3-vessel coronary artery disease    s/p  5 vessel CABG   Diabetes mellitus without complication (HCC)    History of cardiac catheterization 2011   [facility]   Hyperlipidemia    Hypertension    Hypertriglyceridemia    Parkinson's disease    Pneumonia 12/28/2020   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 LIMA to LAD, SVG to D1, OM1 and RPDA   CORONARY ARTERY BYPASS GRAFT  03/1998   5 vessel, [facility]   RIGHT HEART CATH N/A 04/04/2019   Procedure: RIGHT HEART CATH;  Surgeon: Iran Ouch, MD;  Location: [facility] INVASIVE CV LAB;  Service: Cardiovascular;  Laterality: N/A;   RIGHT/LEFT HEART CATH AND CORONARY ANGIOGRAPHY N/A 02/01/2018   Procedure: RIGHT/LEFT HEART CATH AND CORONARY ANGIOGRAPHY;  Surgeon: Iran Ouch, MD;  Location: [facility] INVASIVE CV LAB;  Service: Cardiovascular;  Laterality: N/A;   Patient Active Problem List   Diagnosis Date Noted   Adverse drug reaction 01/20/2023   History of COVID-19 03/18/2022   History of diabetes mellitus, type II 03/18/2022   Hospital discharge follow-up 05/16/2021   Generalized weakness 05/07/2021   Fall    Head injury    Bradycardia    Anemia,  unspecified 02/07/2021   Neutropenia (HCC) 01/29/2021   Thrombocytopenia (HCC) 01/29/2021   Hyponatremia 12/22/2020   Bilateral leg weakness 12/20/2020   Prostate cancer screening 09/08/2020   Mild neurocognitive disorder due to Parkinson's disease 09/06/2020   Low back pain 08/19/2019   Pulmonary hypertension (HCC)    Insomnia 12/21/2018   Sleep apnea in adult 12/09/2018   Periodic limb movement disorder 12/09/2018   Pulmonary nodules 05/18/2018   Wears hearing aid in both ears 05/17/2018   Leg pain, bilateral 02/20/2018   Dyspnea    CKD (chronic kidney disease) stage 3, GFR 30-59 ml/min (HCC) 09/21/2017   History of skin cancer in adulthood 08/14/2016   Parkinson's disease 08/09/2016   Bilateral carotid artery stenosis 04/02/2015   Vertigo, peripheral 10/17/2014   Benign prostatic hypertrophy with urinary frequency 01/31/2014   Encounter for Medicare annual wellness exam 07/02/2013   Obesity 04/03/2013   Other malaise and fatigue 09/21/2012   Hyperlipidemia    Hypertension    3-vessel coronary artery disease    S/P CABG x 5     ONSET DATE: 01/20/23  REFERRING DIAG: A41.660 (ICD-10-CM) - Weakness of both lower extremities   THERAPY DIAG:  Difficulty in walking, not elsewhere classified  Unsteadiness on feet  Abnormality of  gait and mobility  Rationale for Evaluation and Treatment: Rehabilitation  SUBJECTIVE:                                                                                                                                                                                             SUBJECTIVE STATEMENT: Patient continues to report no significant changes since last session.  Patient reports no back pain at the initiation of today's session. Mild low BP this AM. But has improved per pt.   Pt accompanied by: self  PERTINENT HISTORY: PD, history of COVID and significant weakness present following this diagnosis and the use of paxlovid ( August 5 dx date,  thinks Paxlovid may have exacerbated PD symptoms)   Pt reports having COVID and having significant weakness.  Patient reports he also has back pain that is not present for a long time but was exacerbated more recently.  Patient previously did physical therapy for his back and experienced some relief but has not been consistent with the exercises.  Patient also has history of going to Parkinson's rock steady classes multiple times per week prior to onset of his COVID but he has not been back since. Patient previously ambulated without an assistive device but is now ambulating with a straight point cane.  Patient reports increased foot and ankle weakness in comparison with his hips and knees.  Patient also reports low back pain that is exacerbated with prolonged standing. PAIN:  Are you having pain? Yes: NPRS scale: 6-7/10 Pain location: lower back  Pain description: pin when standing upright for prolonged periods  Aggravating factors: standing prolonged periods  Relieving factors: sitting completely alleviates pain   PRECAUTIONS: Fall  RED FLAGS: None   WEIGHT BEARING RESTRICTIONS: No  FALLS: Has patient fallen in last 6 months? Yes. Number of falls 2-3  LIVING ENVIRONMENT: Lives with: lives with their family and lives with their spouse Lives in: House/apartment Stairs: Yes: Internal: 15 steps; on left going up and External: 1 steps; none Has following equipment at home: Quad cane large base  PLOF: Independent  PATIENT GOALS: To improve strength to prior to his COVID diagnosis   OBJECTIVE:   DIAGNOSTIC FINDINGS: n/a  COGNITION: Overall cognitive status: Within functional limits for tasks assessed   SENSATION: Not tested      POSTURE: rounded shoulders, forward head, and posterior pelvic tilt   LOWER EXTREMITY MMT:    MMT Right Eval Left Eval  Hip flexion 4 4  Hip extension    Hip abduction 4 4  Hip adduction 4 4  Hip internal rotation    Hip external rotation  Knee flexion 4+ 4+  Knee extension 4+ 4+  Ankle dorsiflexion 4 4  Ankle plantarflexion    Ankle inversion 4 4  Ankle eversion 4 4  (Blank rows = not tested)  BED MOBILITY:  No limitations per pt   TRANSFERS: Assistive device utilized: None  Sit to stand: Complete Independence Stand to sit: Complete Independence Chair to chair: Complete Independence Floor:  Not tested     STAIRS: Level of Assistance: Complete Independence Stair Negotiation Technique: Alternating Pattern  with Bilateral Rails Number of Stairs: 4  Height of Stairs: 6in  Comments:   GAIT: Gait pattern: step through pattern Distance walked: 60 ft Assistive device utilized: None Level of assistance: Complete Independence Comments:   FUNCTIONAL TESTS:  Pt scores 21 / 28 on mini BEST balance test. Scores < 16 indicate increased risk for falls Para March, [place], & [place]) and MCID is 4 (Godi,et al, 2013)      10 Meter Walk Test: Patient instructed to walk 10 meters (32.8 ft) as quickly and as safely as possible at their normal speed x2 Time measured from 2 meter mark to 8 meter mark to accommodate ramp-up and ramp-down.  Normal speed 1: 1 m/s Normal speed 2: 1.01 m/s Average Normal speed: 1 m/s  Cut off scores: <0.4 m/s = household Ambulator, 0.4-0.8 m/s = limited community Ambulator, >0.8 m/s = community Ambulator, >1.2 m/s = crossing a street, <1.0 = increased fall risk MCID 0.05 m/s (small), 0.13 m/s (moderate) (ANPTA Core Set of Outcome Measures for Adults with Neurologic Conditions, 2018)  Five times Sit to Stand Test (FTSS)  TIME: 11.16 sec  Cut off scores indicative of increased fall risk: >12 sec CVA, >16 sec PD, >13 sec vestibular (ANPTA Core Set of Outcome Measures for Adults with Neurologic Conditions, 2018)   PATIENT SURVEYS:  FOTO: 53  TODAY'S TREATMENT:                                                                                                                               DATE:  BP assessment:  Sitting: 115/53 HR 48.  Standing: 125/47 HR 51  PT placed Gait belt and 3# ankle weights in place at start of PT session. CGA provided throughout session for standing activities to improve safety and reduce   NMR Seated PWR! Up x 15 with 5 sec hold at end range to target antigravity extension.  Seated PWR twist x 10 bil  Seated power step x 10 bil   Standing PWR! [employer]! Step Sit<>stand with 15 # kettle bell x 10   Forward reverse gait 4x 13ft  Side stepping R and L 22ft x 3  Step up/hip flexion x 10 bil with UE support on rail.     Gait with 3# ankle weights x 478ft with supervision  assist from PT for safety and cues for posture and step length  on the LLE. No overt LOB throughout session.  PATIENT EDUCATION: Education details: POC .  Pt educated throughout session about proper posture and technique with exercises. Improved exercise technique, movement at target joints, use of target muscles after min to mod verbal, visual, tactile cues.  Person educated: Patient Education method: Explanation Education comprehension: verbalized understanding  HOME EXERCISE PROGRAM: Access Code: [facility] URL: [email or web address] Date: 01/30/2023 Prepared by: Thresa Ross  Exercises - Shoulder extension with resistance - Neutral  - 1 x daily - 7 x weekly - 2 sets - 12 reps - Standing Shoulder Row with Anchored Resistance  - 1 x daily - 7 x weekly - 2 sets - 15 reps - Shoulder External Rotation and Scapular Retraction with Resistance  - 1 x daily - 7 x weekly - 2 sets - 10 reps - Seated Shoulder Horizontal Abduction with Resistance - Palms Down  - 1 x daily - 7 x weekly - 2 sets - 12 reps -Standing PWR! Moves x 10 ea (UP, ROCK, TWIST,STEP)  GOALS: Goals reviewed with patient? Yes  SHORT TERM GOALS: Target date: 02/25/2023       Patient will be independent in home exercise program to improve strength/mobility for better  functional independence with ADLs. Baseline: No HEP currently  Goal status: INITIAL   LONG TERM GOALS: Target date: 03/25/2023      1.  Patient will increase FOTO score to equal to or greater than  62   to demonstrate statistically significant improvement in mobility and quality of life.  Baseline: 53 Goal status: INITIAL   2.  Patient will increase Mini BEST Balance score by > 4  points to demonstrate decreased fall risk during functional activities. Baseline: 21 Goal status: INITIAL   3.   Patient will improve LE muscle strength to > 4+/5 in hip, knee and ankle strength in order to indicate return to prior level of LE strength  Baseline: see eval chart  Goal status: INITIAL  5.   Patient will increase six minute walk test distance to >1100 with pain less than 5/10 throughout for progression to community ambulator and improve gait ability without discomfort Baseline: 890 ft in 5 minutes, througohut test but more profoundly at 4 minutes pt began to have stooped kyphotic posture. This worsened and was followed by back pain (6/10) that caused the test to be suspended at 5 minutes.  Goal status: INITIAL    ASSESSMENT:  CLINICAL IMPRESSION:  Patient arrived with good motivation form completion of pt activities. PT treatment focused on PD deficits to improve extension movement and improved step height and length. Pt states that BP Is a little low, but WNL when assessed by PT. No s/s of orthostatic hypotension. Pt will continue to benefit from skilled physical therapy intervention to address impairments, improve QOL, and attain therapy goals.       OBJECTIVE IMPAIRMENTS: decreased activity tolerance, decreased balance, decreased mobility, difficulty walking, and decreased strength.   ACTIVITY LIMITATIONS: standing, squatting, stairs, and locomotion level  PARTICIPATION LIMITATIONS: community activity  PERSONAL FACTORS: 3+ comorbidities: PD, HLD, HTN, Back pain  are also  affecting patient's functional outcome.   REHAB POTENTIAL: Good  CLINICAL DECISION MAKING: Stable/uncomplicated  EVALUATION COMPLEXITY: Low  PLAN:  PT FREQUENCY: 2x/week  PT DURATION: 12 weeks  PLANNED INTERVENTIONS: Therapeutic exercises, Therapeutic activity, Neuromuscular re-education, Balance training, Gait training, Patient/Family education, Self Care, Joint mobilization, Joint manipulation, Spinal manipulation, Spinal mobilization, Moist heat, and Manual therapy  PLAN FOR NEXT SESSION:   Continue Antigravity extension activities to improve posture with prolonged  ambulation, PD specific exercises with balance focus and postural focus.   Grier Rocher PT, DPT  Physical Therapist - [facility]  11:52 AM 02/11/23

## 2023-02-12 DIAGNOSIS — R809 Proteinuria, unspecified: Secondary | ICD-10-CM | POA: Diagnosis not present

## 2023-02-12 DIAGNOSIS — D631 Anemia in chronic kidney disease: Secondary | ICD-10-CM | POA: Diagnosis not present

## 2023-02-12 DIAGNOSIS — I10 Essential (primary) hypertension: Secondary | ICD-10-CM | POA: Diagnosis not present

## 2023-02-12 DIAGNOSIS — I129 Hypertensive chronic kidney disease with stage 1 through stage 4 chronic kidney disease, or unspecified chronic kidney disease: Secondary | ICD-10-CM | POA: Diagnosis not present

## 2023-02-12 DIAGNOSIS — N1832 Chronic kidney disease, stage 3b: Secondary | ICD-10-CM | POA: Diagnosis not present

## 2023-02-13 ENCOUNTER — Ambulatory Visit: Payer: Medicare Other | Admitting: Physical Therapy

## 2023-02-13 ENCOUNTER — Encounter: Payer: Self-pay | Admitting: Physical Therapy

## 2023-02-13 DIAGNOSIS — M6281 Muscle weakness (generalized): Secondary | ICD-10-CM | POA: Diagnosis not present

## 2023-02-13 DIAGNOSIS — R262 Difficulty in walking, not elsewhere classified: Secondary | ICD-10-CM | POA: Diagnosis not present

## 2023-02-13 DIAGNOSIS — R2681 Unsteadiness on feet: Secondary | ICD-10-CM | POA: Diagnosis not present

## 2023-02-13 DIAGNOSIS — R269 Unspecified abnormalities of gait and mobility: Secondary | ICD-10-CM | POA: Diagnosis not present

## 2023-02-13 DIAGNOSIS — R2689 Other abnormalities of gait and mobility: Secondary | ICD-10-CM

## 2023-02-13 DIAGNOSIS — M5459 Other low back pain: Secondary | ICD-10-CM | POA: Diagnosis not present

## 2023-02-13 NOTE — Therapy (Signed)
OUTPATIENT PHYSICAL THERAPY NEURO TREATMENT   Patient Name: Johnathan Arnold. MRN: [id number] DOB:[birth year], 77 y.o., male Today's Date: 02/13/2023   PCP: [name], MD   REFERRING PROVIDER: [name], MD   END OF SESSION:  PT End of Session - 02/13/23 0803     Visit Number 5    Number of Visits 16    Date for PT Re-Evaluation 03/25/23    Authorization Type Medicare    Progress Note Due on Visit 10    PT Start Time 0801    PT Stop Time 0844    PT Time Calculation (min) 43 min    Activity Tolerance Patient tolerated treatment well    Behavior During Therapy [facility] for tasks assessed/performed               Past Medical History:  Diagnosis Date   3-vessel coronary artery disease    s/p  5 vessel CABG   Diabetes mellitus without complication (HCC)    History of cardiac catheterization 2011   [facility]   Hyperlipidemia    Hypertension    Hypertriglyceridemia    Parkinson's disease    Pneumonia 12/28/2020   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 LIMA to LAD, SVG to D1, OM1 and RPDA   CORONARY ARTERY BYPASS GRAFT  03/1998   5 vessel, [facility]   RIGHT HEART CATH N/A 04/04/2019   Procedure: RIGHT HEART CATH;  Surgeon: Iran Ouch, MD;  Location: [facility] INVASIVE CV LAB;  Service: Cardiovascular;  Laterality: N/A;   RIGHT/LEFT HEART CATH AND CORONARY ANGIOGRAPHY N/A 02/01/2018   Procedure: RIGHT/LEFT HEART CATH AND CORONARY ANGIOGRAPHY;  Surgeon: Iran Ouch, MD;  Location: [facility] INVASIVE CV LAB;  Service: Cardiovascular;  Laterality: N/A;   Patient Active Problem List   Diagnosis Date Noted   Adverse drug reaction 01/20/2023   History of COVID-19 03/18/2022   History of diabetes mellitus, type II 03/18/2022   Hospital discharge follow-up 05/16/2021   Generalized weakness 05/07/2021   Fall    Head injury    Bradycardia    Anemia,  unspecified 02/07/2021   Neutropenia (HCC) 01/29/2021   Thrombocytopenia (HCC) 01/29/2021   Hyponatremia 12/22/2020   Bilateral leg weakness 12/20/2020   Prostate cancer screening 09/08/2020   Mild neurocognitive disorder due to Parkinson's disease 09/06/2020   Low back pain 08/19/2019   Pulmonary hypertension (HCC)    Insomnia 12/21/2018   Sleep apnea in adult 12/09/2018   Periodic limb movement disorder 12/09/2018   Pulmonary nodules 05/18/2018   Wears hearing aid in both ears 05/17/2018   Leg pain, bilateral 02/20/2018   Dyspnea    CKD (chronic kidney disease) stage 3, GFR 30-59 ml/min (HCC) 09/21/2017   History of skin cancer in adulthood 08/14/2016   Parkinson's disease 08/09/2016   Bilateral carotid artery stenosis 04/02/2015   Vertigo, peripheral 10/17/2014   Benign prostatic hypertrophy with urinary frequency 01/31/2014   Encounter for Medicare annual wellness exam 07/02/2013   Obesity 04/03/2013   Other malaise and fatigue 09/21/2012   Hyperlipidemia    Hypertension    3-vessel coronary artery disease    S/P CABG x 5     ONSET DATE: 01/20/23  REFERRING DIAG: W09.811 (ICD-10-CM) - Weakness of both lower extremities   THERAPY DIAG:  No diagnosis found.  Rationale for Evaluation and Treatment: Rehabilitation  SUBJECTIVE:  SUBJECTIVE STATEMENT: Patient continues to report no significant changes since last session.  Patient reports no back pain at the initiation of today's session. Pt had a few instances of back pain over the weekend.   Pt accompanied by: self  PERTINENT HISTORY: PD, history of COVID and significant weakness present following this diagnosis and the use of paxlovid ( August 5 dx date, thinks Paxlovid may have exacerbated PD symptoms)   Pt reports having COVID and  having significant weakness.  Patient reports he also has back pain that is not present for a long time but was exacerbated more recently.  Patient previously did physical therapy for his back and experienced some relief but has not been consistent with the exercises.  Patient also has history of going to Parkinson's rock steady classes multiple times per week prior to onset of his COVID but he has not been back since. Patient previously ambulated without an assistive device but is now ambulating with a straight point cane.  Patient reports increased foot and ankle weakness in comparison with his hips and knees.  Patient also reports low back pain that is exacerbated with prolonged standing. PAIN:  Are you having pain? Yes: NPRS scale: 6-7/10 Pain location: lower back  Pain description: pin when standing upright for prolonged periods  Aggravating factors: standing prolonged periods  Relieving factors: sitting completely alleviates pain   PRECAUTIONS: Fall  RED FLAGS: None   WEIGHT BEARING RESTRICTIONS: No  FALLS: Has patient fallen in last 6 months? Yes. Number of falls 2-3  LIVING ENVIRONMENT: Lives with: lives with their family and lives with their spouse Lives in: House/apartment Stairs: Yes: Internal: 15 steps; on left going up and External: 1 steps; none Has following equipment at home: Quad cane large base  PLOF: Independent  PATIENT GOALS: To improve strength to prior to his COVID diagnosis   OBJECTIVE:   DIAGNOSTIC FINDINGS: n/a  COGNITION: Overall cognitive status: Within functional limits for tasks assessed   SENSATION: Not tested      POSTURE: rounded shoulders, forward head, and posterior pelvic tilt   LOWER EXTREMITY MMT:    MMT Right Eval Left Eval  Hip flexion 4 4  Hip extension    Hip abduction 4 4  Hip adduction 4 4  Hip internal rotation    Hip external rotation    Knee flexion 4+ 4+  Knee extension 4+ 4+  Ankle dorsiflexion 4 4  Ankle  plantarflexion    Ankle inversion 4 4  Ankle eversion 4 4  (Blank rows = not tested)  BED MOBILITY:  No limitations per pt   TRANSFERS: Assistive device utilized: None  Sit to stand: Complete Independence Stand to sit: Complete Independence Chair to chair: Complete Independence Floor:  Not tested     STAIRS: Level of Assistance: Complete Independence Stair Negotiation Technique: Alternating Pattern  with Bilateral Rails Number of Stairs: 4  Height of Stairs: 6in  Comments:   GAIT: Gait pattern: step through pattern Distance walked: 60 ft Assistive device utilized: None Level of assistance: Complete Independence Comments:   FUNCTIONAL TESTS:  Pt scores 21 / 28 on mini BEST balance test. Scores < 16 indicate increased risk for falls Para March, [place], & [place]) and MCID is 4 (Godi,et al, 2013)      10 Meter Walk Test: Patient instructed to walk 10 meters (32.8 ft) as quickly and as safely as possible at their normal speed x2 Time measured from 2 meter mark to 8 meter mark to accommodate  ramp-up and ramp-down.  Normal speed 1: 1 m/s Normal speed 2: 1.01 m/s Average Normal speed: 1 m/s  Cut off scores: <0.4 m/s = household Ambulator, 0.4-0.8 m/s = limited community Ambulator, >0.8 m/s = community Ambulator, >1.2 m/s = crossing a street, <1.0 = increased fall risk MCID 0.05 m/s (small), 0.13 m/s (moderate) (ANPTA Core Set of Outcome Measures for Adults with Neurologic Conditions, 2018)  Five times Sit to Stand Test (FTSS)  TIME: 11.16 sec  Cut off scores indicative of increased fall risk: >12 sec CVA, >16 sec PD, >13 sec vestibular (ANPTA Core Set of Outcome Measures for Adults with Neurologic Conditions, 2018)   PATIENT SURVEYS:  FOTO: 53  TODAY'S TREATMENT:                                                                                                                               TE:  Octane fitness level 3 x 6 min for aerobic priming and for UE and lE  reciprocal large movement initiation   NMR Seated PWR! Up x 10 with RTB in hands for resistance with HADD Seated PWR twist x 10 bil  On airex beam PWR! Step x 10 to ea side, increased imbalance noted this date compared to previous completion of these   Standing on airex beam PWR! Up holding YTB for horizontal abduction resistance  x 10 reps with 5 sec hold in antigravity extension   TE TRX row 2 x 10, about 45 degree in end point of row   Trx postural extension exercise ( step forward facing away from anchor point and allow arms to go into horizontal abduction)  x 10   Rear delt row x 10 ea side with 2.5# resistance, significant cues for proper performance .   Deltoid row on matrix cable machine 2 x 10 reps at 2.5# ea UE   Pt showing increased weakness this date and required more frequent therapeutic rest breaks    PATIENT EDUCATION: Education details: POC .  Pt educated throughout session about proper posture and technique with exercises. Improved exercise technique, movement at target joints, use of target muscles after min to mod verbal, visual, tactile cues.  Person educated: Patient Education method: Explanation Education comprehension: verbalized understanding  HOME EXERCISE PROGRAM: Access Code: [facility] URL: [email or web address] Date: 01/30/2023 Prepared by: Thresa Ross  Exercises - Shoulder extension with resistance - Neutral  - 1 x daily - 7 x weekly - 2 sets - 12 reps - Standing Shoulder Row with Anchored Resistance  - 1 x daily - 7 x weekly - 2 sets - 15 reps - Shoulder External Rotation and Scapular Retraction with Resistance  - 1 x daily - 7 x weekly - 2 sets - 10 reps - Seated Shoulder Horizontal Abduction with Resistance - Palms Down  - 1 x daily - 7 x weekly - 2 sets - 12 reps -Standing PWR! Moves x 10 ea (UP, ROCK, TWIST,STEP)  GOALS:  Goals reviewed with patient? Yes  SHORT TERM GOALS: Target date: 02/25/2023       Patient will  be independent in home exercise program to improve strength/mobility for better functional independence with ADLs. Baseline: No HEP currently  Goal status: INITIAL   LONG TERM GOALS: Target date: 03/25/2023      1.  Patient will increase FOTO score to equal to or greater than  62   to demonstrate statistically significant improvement in mobility and quality of life.  Baseline: 53 Goal status: INITIAL   2.  Patient will increase Mini BEST Balance score by > 4  points to demonstrate decreased fall risk during functional activities. Baseline: 21 Goal status: INITIAL   3.   Patient will improve LE muscle strength to > 4+/5 in hip, knee and ankle strength in order to indicate return to prior level of LE strength  Baseline: see eval chart  Goal status: INITIAL  5.   Patient will increase six minute walk test distance to >1100 with pain less than 5/10 throughout for progression to community ambulator and improve gait ability without discomfort Baseline: 890 ft in 5 minutes, througohut test but more profoundly at 4 minutes pt began to have stooped kyphotic posture. This worsened and was followed by back pain (6/10) that caused the test to be suspended at 5 minutes.  Goal status: INITIAL    ASSESSMENT:  CLINICAL IMPRESSION:  Continued with current plan of care as laid out in evaluation and recent prior sessions. Pt remains motivated to advance progress toward goals in order to maximize independence and safety at home. Pt requires high level assistance and cuing for completion of exercises in order to provide adequate level of stimulation challenge while minimizing pain and discomfort when possible. Pt closely monitored throughout session pt response and to maximize patient safety during interventions. Pt continues to demonstrate progress toward goals AEB progression of interventions this date either in volume or intensity. Pt will continue to benefit from skilled physical therapy  intervention to address impairments, improve QOL, and attain therapy goals.       OBJECTIVE IMPAIRMENTS: decreased activity tolerance, decreased balance, decreased mobility, difficulty walking, and decreased strength.   ACTIVITY LIMITATIONS: standing, squatting, stairs, and locomotion level  PARTICIPATION LIMITATIONS: community activity  PERSONAL FACTORS: 3+ comorbidities: PD, HLD, HTN, Back pain  are also affecting patient's functional outcome.   REHAB POTENTIAL: Good  CLINICAL DECISION MAKING: Stable/uncomplicated  EVALUATION COMPLEXITY: Low  PLAN:  PT FREQUENCY: 2x/week  PT DURATION: 12 weeks  PLANNED INTERVENTIONS: Therapeutic exercises, Therapeutic activity, Neuromuscular re-education, Balance training, Gait training, Patient/Family education, Self Care, Joint mobilization, Joint manipulation, Spinal manipulation, Spinal mobilization, Moist heat, and Manual therapy  PLAN FOR NEXT SESSION:   Continue Antigravity extension activities to improve posture with prolonged ambulation, PD specific exercises with balance focus and postural focus.   Norman Herrlich PT ,DPT Physical Therapist- [facility]   8:08 AM 02/13/23

## 2023-02-16 ENCOUNTER — Ambulatory Visit: Payer: Medicare Other | Admitting: Physical Therapy

## 2023-02-16 DIAGNOSIS — M5459 Other low back pain: Secondary | ICD-10-CM | POA: Diagnosis not present

## 2023-02-16 DIAGNOSIS — R2689 Other abnormalities of gait and mobility: Secondary | ICD-10-CM

## 2023-02-16 DIAGNOSIS — R262 Difficulty in walking, not elsewhere classified: Secondary | ICD-10-CM

## 2023-02-16 DIAGNOSIS — R269 Unspecified abnormalities of gait and mobility: Secondary | ICD-10-CM | POA: Diagnosis not present

## 2023-02-16 DIAGNOSIS — M6281 Muscle weakness (generalized): Secondary | ICD-10-CM

## 2023-02-16 DIAGNOSIS — R2681 Unsteadiness on feet: Secondary | ICD-10-CM | POA: Diagnosis not present

## 2023-02-16 NOTE — Therapy (Signed)
OUTPATIENT PHYSICAL THERAPY NEURO TREATMENT   Patient Name: Johnathan Arnold. MRN: [id number] DOB:March 25, 1946, 77 y.o., male Today's Date: 02/16/2023   PCP: [name], MD   REFERRING PROVIDER: [name], MD   END OF SESSION:  PT End of Session - 02/16/23 1040     Visit Number 6    Number of Visits 16    Date for PT Re-Evaluation 03/25/23    Authorization Type Medicare    Progress Note Due on Visit 10    PT Start Time 1102    PT Stop Time 1143    PT Time Calculation (min) 41 min    Equipment Utilized During Treatment Gait belt    Activity Tolerance Patient tolerated treatment well    Behavior During Therapy [facility] for tasks assessed/performed               Past Medical History:  Diagnosis Date   3-vessel coronary artery disease    s/p  5 vessel CABG   Diabetes mellitus without complication (HCC)    History of cardiac catheterization 2011   [facility]   Hyperlipidemia    Hypertension    Hypertriglyceridemia    Parkinson's disease    Pneumonia 12/28/2020   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 LIMA to LAD, SVG to D1, OM1 and RPDA   CORONARY ARTERY BYPASS GRAFT  03/1998   5 vessel, [facility]   RIGHT HEART CATH N/A 04/04/2019   Procedure: RIGHT HEART CATH;  Surgeon: Iran Ouch, MD;  Location: [facility] INVASIVE CV LAB;  Service: Cardiovascular;  Laterality: N/A;   RIGHT/LEFT HEART CATH AND CORONARY ANGIOGRAPHY N/A 02/01/2018   Procedure: RIGHT/LEFT HEART CATH AND CORONARY ANGIOGRAPHY;  Surgeon: Iran Ouch, MD;  Location: [facility] INVASIVE CV LAB;  Service: Cardiovascular;  Laterality: N/A;   Patient Active Problem List   Diagnosis Date Noted   Adverse drug reaction 01/20/2023   History of COVID-19 03/18/2022   History of diabetes mellitus, type II 03/18/2022   Hospital discharge follow-up 05/16/2021   Generalized weakness 05/07/2021   Fall     Head injury    Bradycardia    Anemia, unspecified 02/07/2021   Neutropenia (HCC) 01/29/2021   Thrombocytopenia (HCC) 01/29/2021   Hyponatremia 12/22/2020   Bilateral leg weakness 12/20/2020   Prostate cancer screening 09/08/2020   Mild neurocognitive disorder due to Parkinson's disease 09/06/2020   Low back pain 08/19/2019   Pulmonary hypertension (HCC)    Insomnia 12/21/2018   Sleep apnea in adult 12/09/2018   Periodic limb movement disorder 12/09/2018   Pulmonary nodules 05/18/2018   Wears hearing aid in both ears 05/17/2018   Leg pain, bilateral 02/20/2018   Dyspnea    CKD (chronic kidney disease) stage 3, GFR 30-59 ml/min (HCC) 09/21/2017   History of skin cancer in adulthood 08/14/2016   Parkinson's disease 08/09/2016   Bilateral carotid artery stenosis 04/02/2015   Vertigo, peripheral 10/17/2014   Benign prostatic hypertrophy with urinary frequency 01/31/2014   Encounter for Medicare annual wellness exam 07/02/2013   Obesity 04/03/2013   Other malaise and fatigue 09/21/2012   Hyperlipidemia    Hypertension    3-vessel coronary artery disease    S/P CABG x 5     ONSET DATE: 01/20/23  REFERRING DIAG: U27.253 (ICD-10-CM) - Weakness of both lower extremities   THERAPY DIAG:  Difficulty in walking,  not elsewhere classified  Unsteadiness on feet  Abnormality of gait and mobility  Muscle weakness (generalized)  Other abnormalities of gait and mobility  Rationale for Evaluation and Treatment: Rehabilitation  SUBJECTIVE:                                                                                                                                                                                             SUBJECTIVE STATEMENT: Patient continues to report no significant changes since last session.  Patient reports no back pain at the initiation of today's session. Pt feeling better compared to last session.   Pt accompanied by: self  PERTINENT HISTORY: PD,  history of COVID and significant weakness present following this diagnosis and the use of paxlovid ( August 5 dx date, thinks Paxlovid may have exacerbated PD symptoms)   Pt reports having COVID and having significant weakness.  Patient reports he also has back pain that is not present for a long time but was exacerbated more recently.  Patient previously did physical therapy for his back and experienced some relief but has not been consistent with the exercises.  Patient also has history of going to Parkinson's rock steady classes multiple times per week prior to onset of his COVID but he has not been back since. Patient previously ambulated without an assistive device but is now ambulating with a straight point cane.  Patient reports increased foot and ankle weakness in comparison with his hips and knees.  Patient also reports low back pain that is exacerbated with prolonged standing. PAIN:  Are you having pain? Yes: NPRS scale: 6-7/10 Pain location: lower back  Pain description: pin when standing upright for prolonged periods  Aggravating factors: standing prolonged periods  Relieving factors: sitting completely alleviates pain   PRECAUTIONS: Fall  RED FLAGS: None   WEIGHT BEARING RESTRICTIONS: No  FALLS: Has patient fallen in last 6 months? Yes. Number of falls 2-3  LIVING ENVIRONMENT: Lives with: lives with their family and lives with their spouse Lives in: House/apartment Stairs: Yes: Internal: 15 steps; on left going up and External: 1 steps; none Has following equipment at home: Quad cane large base  PLOF: Independent  PATIENT GOALS: To improve strength to prior to his COVID diagnosis   OBJECTIVE:   DIAGNOSTIC FINDINGS: n/a  COGNITION: Overall cognitive status: Within functional limits for tasks assessed   SENSATION: Not tested      POSTURE: rounded shoulders, forward head, and posterior pelvic tilt   LOWER EXTREMITY MMT:    MMT Right Eval Left Eval  Hip  flexion 4 4  Hip extension    Hip abduction  4 4  Hip adduction 4 4  Hip internal rotation    Hip external rotation    Knee flexion 4+ 4+  Knee extension 4+ 4+  Ankle dorsiflexion 4 4  Ankle plantarflexion    Ankle inversion 4 4  Ankle eversion 4 4  (Blank rows = not tested)  BED MOBILITY:  No limitations per pt   TRANSFERS: Assistive device utilized: None  Sit to stand: Complete Independence Stand to sit: Complete Independence Chair to chair: Complete Independence Floor:  Not tested     STAIRS: Level of Assistance: Complete Independence Stair Negotiation Technique: Alternating Pattern  with Bilateral Rails Number of Stairs: 4  Height of Stairs: 6in  Comments:   GAIT: Gait pattern: step through pattern Distance walked: 60 ft Assistive device utilized: None Level of assistance: Complete Independence Comments:   FUNCTIONAL TESTS:  Pt scores 21 / 28 on mini BEST balance test. Scores < 16 indicate increased risk for falls Para March, Eit[place], & [place]) and MCID is 4 (Godi,et al, 2013)      10 Meter Walk Test: Patient instructed to walk 10 meters (32.8 ft) as quickly and as safely as possible at their normal speed x2 Time measured from 2 meter mark to 8 meter mark to accommodate ramp-up and ramp-down.  Normal speed 1: 1 m/s Normal speed 2: 1.01 m/s Average Normal speed: 1 m/s  Cut off scores: <0.4 m/s = household Ambulator, 0.4-0.8 m/s = limited community Ambulator, >0.8 m/s = community Ambulator, >1.2 m/s = crossing a street, <1.0 = increased fall risk MCID 0.05 m/s (small), 0.13 m/s (moderate) (ANPTA Core Set of Outcome Measures for Adults with Neurologic Conditions, 2018)  Five times Sit to Stand Test (FTSS)  TIME: 11.16 sec  Cut off scores indicative of increased fall risk: >12 sec CVA, >16 sec PD, >13 sec vestibular (ANPTA Core Set of Outcome Measures for Adults with Neurologic Conditions, 2018)   PATIENT SURVEYS:  FOTO: 53  TODAY'S TREATMENT:                                                                                                                                TE:  Octane fitness level 3 x 6 min for aerobic priming and for UE and lE reciprocal large movement initiation   NMR PWR! Flow from seated step to standing PWR! Up   Seated PWR! Up 2x 10 with RTB in hands for resistance with HADD Seated PWR twist x 10 bil  On airex beam PWR! Step x 10 to ea side, increased imbalance noted this date compared to previous completion of these   Standing PWR! Up holding RTB for horizontal abduction resistance  x 10 reps   TE  TRX row 2 x 10, about 45 degree in end point of row   Trx postural extension exercise ( step forward facing away from anchor point and allow arms to go into horizontal abduction)  2 x 10  Deltoid row/ HADD on matrix cable machine 2 x 10 reps at 2.5# ea UE   Dead lift from 24 in box with 15# KB 2 x 10 reps, cues for knee and back positioning to isolate hip hinge movement.   PATIENT EDUCATION: Education details: POC .  Pt educated throughout session about proper posture and technique with exercises. Improved exercise technique, movement at target joints, use of target muscles after min to mod verbal, visual, tactile cues.  Person educated: Patient Education method: Explanation Education comprehension: verbalized understanding  HOME EXERCISE PROGRAM: Access Code: [facility] URL: [email or web address] Date: 01/30/2023 Prepared by: Thresa Ross  Exercises - Shoulder extension with resistance - Neutral  - 1 x daily - 7 x weekly - 2 sets - 12 reps - Standing Shoulder Row with Anchored Resistance  - 1 x daily - 7 x weekly - 2 sets - 15 reps - Shoulder External Rotation and Scapular Retraction with Resistance  - 1 x daily - 7 x weekly - 2 sets - 10 reps - Seated Shoulder Horizontal Abduction with Resistance - Palms Down  - 1 x daily - 7 x weekly - 2 sets - 12 reps -Standing PWR! Moves x 10 ea (UP,  ROCK, TWIST,STEP)  GOALS: Goals reviewed with patient? Yes  SHORT TERM GOALS: Target date: 02/25/2023       Patient will be independent in home exercise program to improve strength/mobility for better functional independence with ADLs. Baseline: No HEP currently  Goal status: INITIAL   LONG TERM GOALS: Target date: 03/25/2023      1.  Patient will increase FOTO score to equal to or greater than  62   to demonstrate statistically significant improvement in mobility and quality of life.  Baseline: 53 Goal status: INITIAL   2.  Patient will increase Mini BEST Balance score by > 4  points to demonstrate decreased fall risk during functional activities. Baseline: 21 Goal status: INITIAL   3.   Patient will improve LE muscle strength to > 4+/5 in hip, knee and ankle strength in order to indicate return to prior level of LE strength  Baseline: see eval chart  Goal status: INITIAL  5.   Patient will increase six minute walk test distance to >1100 with pain less than 5/10 throughout for progression to community ambulator and improve gait ability without discomfort Baseline: 890 ft in 5 minutes, througohut test but more profoundly at 4 minutes pt began to have stooped kyphotic posture. This worsened and was followed by back pain (6/10) that caused the test to be suspended at 5 minutes.  Goal status: INITIAL    ASSESSMENT:  CLINICAL IMPRESSION:  Continued with current plan of care as laid out in evaluation and recent prior sessions. Pt remains motivated to advance progress toward goals in order to maximize independence and safety at home. Pt requires high level assistance and cuing for completion of exercises in order to provide adequate level of stimulation challenge while minimizing pain and discomfort when possible. Pt closely monitored throughout session pt response and to maximize patient safety during interventions. Pt continues to demonstrate progress toward goals AEB  progression of interventions this date either in volume or intensity. Pt will continue to benefit from skilled physical therapy intervention to address impairments, improve QOL, and attain therapy goals.       OBJECTIVE IMPAIRMENTS: decreased activity tolerance, decreased balance, decreased mobility, difficulty walking, and decreased strength.   ACTIVITY LIMITATIONS: standing, squatting, stairs, and locomotion level  PARTICIPATION LIMITATIONS: community  activity  PERSONAL FACTORS: 3+ comorbidities: PD, HLD, HTN, Back pain  are also affecting patient's functional outcome.   REHAB POTENTIAL: Good  CLINICAL DECISION MAKING: Stable/uncomplicated  EVALUATION COMPLEXITY: Low  PLAN:  PT FREQUENCY: 2x/week  PT DURATION: 12 weeks  PLANNED INTERVENTIONS: Therapeutic exercises, Therapeutic activity, Neuromuscular re-education, Balance training, Gait training, Patient/Family education, Self Care, Joint mobilization, Joint manipulation, Spinal manipulation, Spinal mobilization, Moist heat, and Manual therapy  PLAN FOR NEXT SESSION:   Continue Antigravity extension activities to improve posture with prolonged ambulation, PD specific exercises with balance focus and postural focus.   Norman Herrlich PT ,DPT Physical Therapist- [place]  [facility]   10:46 AM 02/16/23

## 2023-02-24 ENCOUNTER — Ambulatory Visit: Payer: Medicare Other

## 2023-02-24 ENCOUNTER — Ambulatory Visit: Payer: Medicare Other | Admitting: Cardiovascular Disease

## 2023-02-26 ENCOUNTER — Encounter: Payer: Self-pay | Admitting: Internal Medicine

## 2023-02-26 ENCOUNTER — Ambulatory Visit: Payer: Medicare Other | Admitting: Physical Therapy

## 2023-02-26 DIAGNOSIS — R269 Unspecified abnormalities of gait and mobility: Secondary | ICD-10-CM

## 2023-02-26 DIAGNOSIS — R2681 Unsteadiness on feet: Secondary | ICD-10-CM | POA: Diagnosis not present

## 2023-02-26 DIAGNOSIS — R2689 Other abnormalities of gait and mobility: Secondary | ICD-10-CM | POA: Diagnosis not present

## 2023-02-26 DIAGNOSIS — M5459 Other low back pain: Secondary | ICD-10-CM | POA: Diagnosis not present

## 2023-02-26 DIAGNOSIS — M6281 Muscle weakness (generalized): Secondary | ICD-10-CM

## 2023-02-26 DIAGNOSIS — R262 Difficulty in walking, not elsewhere classified: Secondary | ICD-10-CM | POA: Diagnosis not present

## 2023-02-26 NOTE — Therapy (Signed)
OUTPATIENT PHYSICAL THERAPY NEURO TREATMENT   Patient Name: Johnathan Arnold. MRN: [id number] DOB:1945-11-06, 77 y.o., male Today's Date: 02/26/2023   PCP: [name], MD   REFERRING PROVIDER: [name], MD   END OF SESSION:  PT End of Session - 02/26/23 1022     Visit Number 7    Number of Visits 16    Date for PT Re-Evaluation 03/25/23    Authorization Type Medicare    Progress Note Due on Visit 10    PT Start Time 1017    PT Stop Time 1058    PT Time Calculation (min) 41 min    Equipment Utilized During Treatment Gait belt    Activity Tolerance Patient tolerated treatment well    Behavior During Therapy WFL for tasks assessed/performed               Past Medical History:  Diagnosis Date   3-vessel coronary artery disease    s/p  5 vessel CABG   Diabetes mellitus without complication (HCC)    History of cardiac catheterization 2011   [facility]   Hyperlipidemia    Hypertension    Hypertriglyceridemia    Parkinson's disease    Pneumonia 12/28/2020   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 LIMA to LAD, SVG to D1, OM1 and RPDA   CORONARY ARTERY BYPASS GRAFT  03/1998   5 vessel, [facility]   RIGHT HEART CATH N/A 04/04/2019   Procedure: RIGHT HEART CATH;  Surgeon: Iran Ouch, MD;  Location: [facility] INVASIVE CV LAB;  Service: Cardiovascular;  Laterality: N/A;   RIGHT/LEFT HEART CATH AND CORONARY ANGIOGRAPHY N/A 02/01/2018   Procedure: RIGHT/LEFT HEART CATH AND CORONARY ANGIOGRAPHY;  Surgeon: Iran Ouch, MD;  Location: [facility] INVASIVE CV LAB;  Service: Cardiovascular;  Laterality: N/A;   Patient Active Problem List   Diagnosis Date Noted   Adverse drug reaction 01/20/2023   History of COVID-19 03/18/2022   History of diabetes mellitus, type II 03/18/2022   Hospital discharge follow-up 05/16/2021   Generalized weakness 05/07/2021    Fall    Head injury    Bradycardia    Anemia, unspecified 02/07/2021   Neutropenia (HCC) 01/29/2021   Thrombocytopenia (HCC) 01/29/2021   Hyponatremia 12/22/2020   Bilateral leg weakness 12/20/2020   Prostate cancer screening 09/08/2020   Mild neurocognitive disorder due to Parkinson's disease 09/06/2020   Low back pain 08/19/2019   Pulmonary hypertension (HCC)    Insomnia 12/21/2018   Sleep apnea in adult 12/09/2018   Periodic limb movement disorder 12/09/2018   Pulmonary nodules 05/18/2018   Wears hearing aid in both ears 05/17/2018   Leg pain, bilateral 02/20/2018   Dyspnea    CKD (chronic kidney disease) stage 3, GFR 30-59 ml/min (HCC) 09/21/2017   History of skin cancer in adulthood 08/14/2016   Parkinson's disease 08/09/2016   Bilateral carotid artery stenosis 04/02/2015   Vertigo, peripheral 10/17/2014   Benign prostatic hypertrophy with urinary frequency 01/31/2014   Encounter for Medicare annual wellness exam 07/02/2013   Obesity 04/03/2013   Other malaise and fatigue 09/21/2012   Hyperlipidemia    Hypertension    3-vessel coronary artery disease    S/P CABG x 5     ONSET DATE: 01/20/23  REFERRING DIAG: W41.324 (ICD-10-CM) - Weakness of both lower extremities   THERAPY DIAG:  Difficulty in walking,  not elsewhere classified  Unsteadiness on feet  Abnormality of gait and mobility  Muscle weakness (generalized)  Other abnormalities of gait and mobility  Rationale for Evaluation and Treatment: Rehabilitation  SUBJECTIVE:                                                                                                                                                                                             SUBJECTIVE STATEMENT: Patient reports no back pain at the initiation of today's session but did have some debilitating back pain over the weekend but is feeling better now.   Pt accompanied by: self  PERTINENT HISTORY: PD, history of COVID and  significant weakness present following this diagnosis and the use of paxlovid ( August 5 dx date, thinks Paxlovid may have exacerbated PD symptoms)   Pt reports having COVID and having significant weakness.  Patient reports he also has back pain that is not present for a long time but was exacerbated more recently.  Patient previously did physical therapy for his back and experienced some relief but has not been consistent with the exercises.  Patient also has history of going to Parkinson's rock steady classes multiple times per week prior to onset of his COVID but he has not been back since. Patient previously ambulated without an assistive device but is now ambulating with a straight point cane.  Patient reports increased foot and ankle weakness in comparison with his hips and knees.  Patient also reports low back pain that is exacerbated with prolonged standing. PAIN:  Are you having pain? Yes: NPRS scale: 6-7/10 Pain location: lower back  Pain description: pin when standing upright for prolonged periods  Aggravating factors: standing prolonged periods  Relieving factors: sitting completely alleviates pain   PRECAUTIONS: Fall  RED FLAGS: None   WEIGHT BEARING RESTRICTIONS: No  FALLS: Has patient fallen in last 6 months? Yes. Number of falls 2-3  LIVING ENVIRONMENT: Lives with: lives with their family and lives with their spouse Lives in: House/apartment Stairs: Yes: Internal: 15 steps; on left going up and External: 1 steps; none Has following equipment at home: Quad cane large base  PLOF: Independent  PATIENT GOALS: To improve strength to prior to his COVID diagnosis   OBJECTIVE:   DIAGNOSTIC FINDINGS: n/a  COGNITION: Overall cognitive status: Within functional limits for tasks assessed   SENSATION: Not tested      POSTURE: rounded shoulders, forward head, and posterior pelvic tilt   LOWER EXTREMITY MMT:    MMT Right Eval Left Eval  Hip flexion 4 4  Hip  extension    Hip abduction 4 4  Hip adduction 4 4  Hip internal rotation    Hip external rotation    Knee flexion 4+ 4+  Knee extension 4+ 4+  Ankle dorsiflexion 4 4  Ankle plantarflexion    Ankle inversion 4 4  Ankle eversion 4 4  (Blank rows = not tested)  BED MOBILITY:  No limitations per pt   TRANSFERS: Assistive device utilized: None  Sit to stand: Complete Independence Stand to sit: Complete Independence Chair to chair: Complete Independence Floor:  Not tested     STAIRS: Level of Assistance: Complete Independence Stair Negotiation Technique: Alternating Pattern  with Bilateral Rails Number of Stairs: 4  Height of Stairs: 6in  Comments:   GAIT: Gait pattern: step through pattern Distance walked: 60 ft Assistive device utilized: None Level of assistance: Complete Independence Comments:   FUNCTIONAL TESTS:  Pt scores 21 / 28 on mini BEST balance test. Scores < 16 indicate increased risk for falls Para March, [place], & Walker) and MCID is 4 (Godi,et al, 2013)      10 Meter Walk Test: Patient instructed to walk 10 meters (32.8 ft) as quickly and as safely as possible at their normal speed x2 Time measured from 2 meter mark to 8 meter mark to accommodate ramp-up and ramp-down.  Normal speed 1: 1 m/s Normal speed 2: 1.01 m/s Average Normal speed: 1 m/s  Cut off scores: <0.4 m/s = household Ambulator, 0.4-0.8 m/s = limited community Ambulator, >0.8 m/s = community Ambulator, >1.2 m/s = crossing a street, <1.0 = increased fall risk MCID 0.05 m/s (small), 0.13 m/s (moderate) (ANPTA Core Set of Outcome Measures for Adults with Neurologic Conditions, 2018)  Five times Sit to Stand Test (FTSS)  TIME: 11.16 sec  Cut off scores indicative of increased fall risk: >12 sec CVA, >16 sec PD, >13 sec vestibular (ANPTA Core Set of Outcome Measures for Adults with Neurologic Conditions, 2018)   PATIENT SURVEYS:  FOTO: 53  TODAY'S TREATMENT:                                                                                                                                TE:  Nustep level 3 x 6 min for aerobic priming and for UE and LE reciprocal large movement initiation   NMR Seated PWR! Up x 15  PWR! Rock seated x 10 ea side  PWR! Twist seated x 10 ea side    PWR! Flow from seated step to standing PWR! Up to standing PWR! Step x 10 total reps   On airex beam PWR! Step x 10 to ea side, increased imbalance noted this date compared to previous completion of these   Standing PWR! Up holding RTB for horizontal abduction resistance  x 10 reps   TE  TRX row 2 x 10, about 45 degree in end point of row   Trx postural extension exercise ( step forward facing away from anchor point and allow arms to go  into horizontal abduction)  2 x 10   Deltoid row/ HADD on matrix cable machine 2 x 10 reps at 2.5# ea UE   NMR Standing PWR! Moves on airex beam  - Twist and Step x 10 ea to ea side   PATIENT EDUCATION: Education details: POC .  Pt educated throughout session about proper posture and technique with exercises. Improved exercise technique, movement at target joints, use of target muscles after min to mod verbal, visual, tactile cues.  Person educated: Patient Education method: Explanation Education comprehension: verbalized understanding  HOME EXERCISE PROGRAM: Access Code: [facility] URL: [email or web address] Date: 01/30/2023 Prepared by: Thresa Ross  Exercises - Shoulder extension with resistance - Neutral  - 1 x daily - 7 x weekly - 2 sets - 12 reps - Standing Shoulder Row with Anchored Resistance  - 1 x daily - 7 x weekly - 2 sets - 15 reps - Shoulder External Rotation and Scapular Retraction with Resistance  - 1 x daily - 7 x weekly - 2 sets - 10 reps - Seated Shoulder Horizontal Abduction with Resistance - Palms Down  - 1 x daily - 7 x weekly - 2 sets - 12 reps -Standing PWR! Moves x 10 ea (UP, ROCK, TWIST,STEP)  GOALS: Goals  reviewed with patient? Yes  SHORT TERM GOALS: Target date: 02/25/2023       Patient will be independent in home exercise program to improve strength/mobility for better functional independence with ADLs. Baseline: No HEP currently  Goal status: INITIAL   LONG TERM GOALS: Target date: 03/25/2023      1.  Patient will increase FOTO score to equal to or greater than  62   to demonstrate statistically significant improvement in mobility and quality of life.  Baseline: 53 Goal status: INITIAL   2.  Patient will increase Mini BEST Balance score by > 4  points to demonstrate decreased fall risk during functional activities. Baseline: 21 Goal status: INITIAL   3.   Patient will improve LE muscle strength to > 4+/5 in hip, knee and ankle strength in order to indicate return to prior level of LE strength  Baseline: see eval chart  Goal status: INITIAL  5.   Patient will increase six minute walk test distance to >1100 with pain less than 5/10 throughout for progression to community ambulator and improve gait ability without discomfort Baseline: 890 ft in 5 minutes, througohut test but more profoundly at 4 minutes pt began to have stooped kyphotic posture. This worsened and was followed by back pain (6/10) that caused the test to be suspended at 5 minutes.  Goal status: INITIAL    ASSESSMENT:  CLINICAL IMPRESSION:  Continued with current plan of care as laid out in evaluation and recent prior sessions. Pt remains motivated to advance progress toward goals in order to maximize independence and safety at home. Pt requires high level assistance and cuing for completion of exercises in order to provide adequate level of stimulation challenge while minimizing pain and discomfort when possible. Pt closely monitored throughout session pt response and to maximize patient safety during interventions. Pt continues to demonstrate progress toward goals AEB progression of interventions this date  either in volume or intensity. Pt will continue to benefit from skilled physical therapy intervention to address impairments, improve QOL, and attain therapy goals.       OBJECTIVE IMPAIRMENTS: decreased activity tolerance, decreased balance, decreased mobility, difficulty walking, and decreased strength.   ACTIVITY LIMITATIONS: standing, squatting, stairs, and locomotion level  PARTICIPATION LIMITATIONS: community activity  PERSONAL FACTORS: 3+ comorbidities: PD, HLD, HTN, Back pain  are also affecting patient's functional outcome.   REHAB POTENTIAL: Good  CLINICAL DECISION MAKING: Stable/uncomplicated  EVALUATION COMPLEXITY: Low  PLAN:  PT FREQUENCY: 2x/week  PT DURATION: 12 weeks  PLANNED INTERVENTIONS: Therapeutic exercises, Therapeutic activity, Neuromuscular re-education, Balance training, Gait training, Patient/Family education, Self Care, Joint mobilization, Joint manipulation, Spinal manipulation, Spinal mobilization, Moist heat, and Manual therapy  PLAN FOR NEXT SESSION:   Continue Antigravity extension activities to improve posture with prolonged ambulation, PD specific exercises with balance focus and postural focus.   Norman Herrlich PT ,DPT Physical Therapist- [facility]  [facility]   10:23 AM 02/26/23

## 2023-02-28 NOTE — Progress Notes (Unsigned)
Cardiology Office Note    Date:  03/02/2023  ID:  Johnathan Arnold., DOB April 18, 1946, MRN [id number] PCP:  [name], MD  Cardiologist:  Lorine Bears, MD  Electrophysiologist:  None   Chief Complaint: Follow up for CAD  History of Present Illness: Marland Kitchen    Johnathan Parlee. is a 77 y.o. male with visit-pertinent history of CAD s/p CABG in 1999, progressive Parkinson's disease followed by neurology, CKD stage IIIb followed by nephrology, HLD, DM2 and sleep apnea on CPAP.  Right and left heart cath in 01/2018 showed occluded native arteries with patent grafts including LIMA to LAD, SVG to diagonal, SVG to OM and SVG to distal RCA.  RHC showed normal filling pressures, mild pulmonary hypertension and normal cardiac output.  He has carotid Doppler showed mild nonobstructive bilateral disease.  He has a history of chronic bilateral exertional leg pain with normal lower extremity arterial Doppler in 2018.  In the setting of moderate to severe pulmonary hypertension, prior VQ scan was low probability for PE.  RHC in 03/2019 showed moderate pulmonary hypertension at 54/13 and Hg with a wedge pressure of 17 mmHg and a PVR 2.22.  Pulmonary hypertension was felt to be of a mixed etiology.  He was admitted to the hospital in 05/2021 with a fall and generalized weakness, felt to be mostly polypharmacy induced, including benzodiazepines and antihypertensive medications.  MRI of the brain was nonacute.  CT of the chest was negative for PE.  During admission he was noted be bradycardic in the 40s resulting in carvedilol being discontinued.  On OV in 02/2022 patient reported near syncope and weakness.  Zio monitor indicated at rate of 58 bpm.  Predominant underlying rhythm was sinus rhythm.  There were 7 runs of SVT, fastest lasting 4 beats with a max rate of 176 bpm, the longest lasting 13.8 seconds with an average rate of 110 bpm.  Rare PACs and rare PVCs. He was last seen in office on 08/28/22 by Dr. Kirke Corin, he  had remained stable from a cardiac perspective.   On 11/12/22 he presented to the ED following a mechanical fall. Per notes patient caught his toe causing him to fall forward. He was evaluated in the ED, CT scan of the head, face and cervical spine were reassuring.   Today Johnathan Arnold presents for follow-up.  He reports that he is doing well. Notes he had Covid in August, when taking Paxlovid he became extremely weak, was bed ridden for three days. He notes that he is otherwise doing well. He denies any further falls, in June he was walking to fast and tripped resulting in the fall. He denies chest pain and feels his breathing is at his baseline. Notes he tries to stay active and has recently been attending physical therapy.   Labwork independently reviewed: 09/17/22: Sodium 140, potassium 4.6, creatinine 1.88, AST 14, ALT 5, HGB 11.3, HCT 32.4, TSH 2.04  02/10/23: HGB 11.2, HCT 33.4, creatinine 1.79, sodium 138, potassium 4.9, mag 1.7  ROS: .    Today he denies chest pain, shortness of breath, lower extremity edema, fatigue, palpitations, melena, hematuria, hemoptysis, diaphoresis, weakness, presyncope, syncope, orthopnea, and PND.  All other systems are reviewed and otherwise negative.  Studies Reviewed: Marland Kitchen    EKG:  EKG is ordered today, personally reviewed, demonstrating  EKG Interpretation Date/Time:  Monday March 02 2023 13:31:58 EDT Ventricular Rate:  54 PR Interval:  166 QRS Duration:  84 QT Interval:  448  QTC Calculation: 424 R Axis:   13  Text Interpretation: Sinus bradycardia No significant change since last tracing Confirmed by Reather Littler [phone]) on 03/02/2023 1:40:50 PM    CV Studies: Cardiac studies reviewed are outlined and summarized above. Otherwise please see EMR for full report.  Current Reported Medications:.    Current Meds  Medication Sig   aspirin EC 81 MG tablet Take 1 tablet (81 mg total) by mouth at bedtime.   carbidopa-levodopa (SINEMET CR) 50-200 MG tablet  TAKE 1 TABLET BY MOUTH AT BEDTIME   carbidopa-levodopa (SINEMET IR) 25-100 MG tablet TAKE TAKE 2 TABLETS BY MOUTH EVERY MORNING AT 8, TAKE TAKE 2 TABLETS BY MOUTH DAILY AT 11AM, TAKE 2 TABLETS BY MOUTH DAILY AT 2 IN THE AFTERNOON AND TAKE ONE TABLET BY MOUTH EVERY EVENING AT 5   clonazePAM (KLONOPIN) 1 MG tablet TAKE 1 TABLET BY MOUTH AT BEDTIME   diazepam (VALIUM) 5 MG tablet Take 5 mg by mouth 3 (three) times daily as needed.   isosorbide mononitrate (IMDUR) 60 MG 24 hr tablet TAKE 1 TABLET BY MOUTH DAILY   melatonin 3 MG TABS tablet Take 1.5 mg by mouth at bedtime as needed.   Multiple Vitamin (MULTIVITAMIN) tablet Take 1 tablet by mouth daily.   nitroGLYCERIN (NITROSTAT) 0.4 MG SL tablet Place 1 tablet (0.4 mg total) under the tongue every 5 (five) minutes as needed for chest pain.   omeprazole (PRILOSEC) 20 MG capsule Take 1 capsule (20 mg total) by mouth every morning.   polyethylene glycol (MIRALAX / GLYCOLAX) packet Take 17 g by mouth daily as needed.   ranolazine (RANEXA) 1000 MG SR tablet TAKE 1 TABLET BY MOUTH TWICE A DAY   rosuvastatin (CRESTOR) 10 MG tablet TAKE 1 TABLET BY MOUTH DAILY   tamsulosin (FLOMAX) 0.4 MG CAPS capsule TAKE ONE CAPSULE BY MOUTH DAILY   [DISCONTINUED] furosemide (LASIX) 40 MG tablet Take 40 mg by mouth daily.   [DISCONTINUED] losartan (COZAAR) 25 MG tablet Take 1 tablet (25 mg total) by mouth daily.    Physical Exam:    VS:  BP (!) 144/60 (BP Location: Left Arm, Patient Position: Sitting)   Pulse (!) 54   Ht 5\' 7"  (1.702 m)   Wt 168 lb 9.6 oz (76.5 kg)   SpO2 99%   BMI 26.41 kg/m    Wt Readings from Last 3 Encounters:  03/02/23 168 lb 9.6 oz (76.5 kg)  01/20/23 164 lb 3.2 oz (74.5 kg)  11/12/22 174 lb 2.6 oz (79 kg)    GEN: Well nourished, well developed in no acute distress NECK: No JVD; No carotid bruits CARDIAC: RRR, 2/6 systolic murmur, no rubs, gallops RESPIRATORY:  Clear to auscultation without rales, wheezing or rhonchi  ABDOMEN: Soft,  non-tender, non-distended EXTREMITIES:  No edema; No acute deformity     Asessement and Plan:.    CAD: s/p CABG in 1999. Right and left heart cath in 01/2018 showed occluded native arteries with patent grafts including LIMA to LAD, SVG to diagonal, SVG to OM and SVG to distal RCA. Stable with no anginal symptoms. No indication for ischemic evaluation. Heart healthy diet and regular cardiovascular exercise encouraged. Continue aspirin 81 mg daily, isosorbide mononitrate 60 mg daily, Losartan 25 mg as needed, Ranexa 1000mg  twice daily, rosuvastatin 10 mg daily. Sublingual nitroglycerin as needed for chest pain.   Moderate pulmonary hypertension: RHC in 03/2019 showed moderate pulmonary hypertension at 54/13 and Hg with a wedge pressure of 17 mmHg and a PVR  2.22.  Pulmonary hypertension was felt to be of a mixed etiology. Stable and euvolemic on exam today, reports his breathing is at his baseline. Notes he is taking furosemide as needed for swelling in his hands per his PCP, tolerating well.   HTN: Initial blood pressure today 152/60 on recheck was 144/60. Reports he feels poorly if his systolic BP is less than 120. Notes his PCP recently changes his Losartan to as needed for systolic BP greater than 140 mmHg at night and changes his furosemide to 20 mg daily as needed.  Reviewed home blood pressures, range from 104/48 to 165/52, appears to average in the 130's/50's. Continue to allow permissive hypertension. Continue antihypertensive regimen as noted above.   HLD: Last lipid profile on 03/2023 indicated total cholesterol of 113 HDL 60, triglycerides 86, LDL 36. Continue rosuvastatin 10 mg daily.   Bilateral carotid artery disease: Bilateral carotid ultrasound in 04/2021 indicated less than 50% stenosis of the right ICA and less than 50% stenosis of the left ICA. Will repeat bilateral carotid ultrasound for monitoring.   Parkinsons disease: Notes increased weakness following recent Covid-19 and taking  Paxlovid. Followed by neurology.      Disposition: F/u with Dr. Kirke Corin in 6 months.   Signed, Rip Harbour, NP

## 2023-03-02 ENCOUNTER — Encounter: Payer: Self-pay | Admitting: Physician Assistant

## 2023-03-02 ENCOUNTER — Ambulatory Visit: Payer: Medicare Other | Attending: Cardiovascular Disease | Admitting: Cardiology

## 2023-03-02 ENCOUNTER — Ambulatory Visit: Payer: Medicare Other | Admitting: Physical Therapy

## 2023-03-02 VITALS — BP 144/60 | HR 54 | Ht 67.0 in | Wt 168.6 lb

## 2023-03-02 DIAGNOSIS — R269 Unspecified abnormalities of gait and mobility: Secondary | ICD-10-CM

## 2023-03-02 DIAGNOSIS — R278 Other lack of coordination: Secondary | ICD-10-CM

## 2023-03-02 DIAGNOSIS — E785 Hyperlipidemia, unspecified: Secondary | ICD-10-CM | POA: Insufficient documentation

## 2023-03-02 DIAGNOSIS — R2681 Unsteadiness on feet: Secondary | ICD-10-CM | POA: Diagnosis not present

## 2023-03-02 DIAGNOSIS — I6523 Occlusion and stenosis of bilateral carotid arteries: Secondary | ICD-10-CM | POA: Insufficient documentation

## 2023-03-02 DIAGNOSIS — M5459 Other low back pain: Secondary | ICD-10-CM

## 2023-03-02 DIAGNOSIS — R29898 Other symptoms and signs involving the musculoskeletal system: Secondary | ICD-10-CM

## 2023-03-02 DIAGNOSIS — I272 Pulmonary hypertension, unspecified: Secondary | ICD-10-CM | POA: Insufficient documentation

## 2023-03-02 DIAGNOSIS — I2510 Atherosclerotic heart disease of native coronary artery without angina pectoris: Secondary | ICD-10-CM | POA: Diagnosis not present

## 2023-03-02 DIAGNOSIS — R2689 Other abnormalities of gait and mobility: Secondary | ICD-10-CM | POA: Diagnosis not present

## 2023-03-02 DIAGNOSIS — M6281 Muscle weakness (generalized): Secondary | ICD-10-CM

## 2023-03-02 DIAGNOSIS — R262 Difficulty in walking, not elsewhere classified: Secondary | ICD-10-CM | POA: Diagnosis not present

## 2023-03-02 DIAGNOSIS — I10 Essential (primary) hypertension: Secondary | ICD-10-CM | POA: Diagnosis not present

## 2023-03-02 MED ORDER — FUROSEMIDE 20 MG PO TABS: 20.0000 mg | ORAL_TABLET | Freq: Every day | ORAL | 3 refills | Status: DC | PRN

## 2023-03-02 MED ORDER — LOSARTAN POTASSIUM 25 MG PO TABS: 25.0000 mg | ORAL_TABLET | Freq: Every day | ORAL | 3 refills | Status: DC | PRN

## 2023-03-02 NOTE — Therapy (Signed)
OUTPATIENT PHYSICAL THERAPY NEURO TREATMENT   Patient Name: Johnathan Arnold. MRN: [id number] DOB:1945/09/16, 77 y.o., male Today's Date: 03/02/2023   PCP: [name], MD   REFERRING PROVIDER: [name], MD   END OF SESSION:  PT End of Session - 03/02/23 0930     Visit Number 8    Number of Visits 16    Date for PT Re-Evaluation 03/25/23    Authorization Type Medicare    Progress Note Due on Visit 10    PT Start Time 0932    PT Stop Time 1015    PT Time Calculation (min) 43 min    Equipment Utilized During Treatment Gait belt    Activity Tolerance Patient tolerated treatment well    Behavior During Therapy [facility] for tasks assessed/performed                Past Medical History:  Diagnosis Date   3-vessel coronary artery disease    s/p  5 vessel CABG   Diabetes mellitus without complication (HCC)    History of cardiac catheterization 2011   [facility]   Hyperlipidemia    Hypertension    Hypertriglyceridemia    Parkinson's disease    Pneumonia 12/28/2020   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 LIMA to LAD, SVG to D1, OM1 and RPDA   CORONARY ARTERY BYPASS GRAFT  03/1998   5 vessel, [facility]   RIGHT HEART CATH N/A 04/04/2019   Procedure: RIGHT HEART CATH;  Surgeon: Iran Ouch, MD;  Location: [facility] INVASIVE CV LAB;  Service: Cardiovascular;  Laterality: N/A;   RIGHT/LEFT HEART CATH AND CORONARY ANGIOGRAPHY N/A 02/01/2018   Procedure: RIGHT/LEFT HEART CATH AND CORONARY ANGIOGRAPHY;  Surgeon: Iran Ouch, MD;  Location: [facility] INVASIVE CV LAB;  Service: Cardiovascular;  Laterality: N/A;   Patient Active Problem List   Diagnosis Date Noted   Adverse drug reaction 01/20/2023   History of COVID-19 03/18/2022   History of diabetes mellitus, type II 03/18/2022   Hospital discharge follow-up 05/16/2021   Generalized weakness 05/07/2021    Fall    Head injury    Bradycardia    Anemia, unspecified 02/07/2021   Neutropenia (HCC) 01/29/2021   Thrombocytopenia (HCC) 01/29/2021   Hyponatremia 12/22/2020   Bilateral leg weakness 12/20/2020   Prostate cancer screening 09/08/2020   Mild neurocognitive disorder due to Parkinson's disease 09/06/2020   Low back pain 08/19/2019   Pulmonary hypertension (HCC)    Insomnia 12/21/2018   Sleep apnea in adult 12/09/2018   Periodic limb movement disorder 12/09/2018   Pulmonary nodules 05/18/2018   Wears hearing aid in both ears 05/17/2018   Leg pain, bilateral 02/20/2018   Dyspnea    CKD (chronic kidney disease) stage 3, GFR 30-59 ml/min (HCC) 09/21/2017   History of skin cancer in adulthood 08/14/2016   Parkinson's disease 08/09/2016   Bilateral carotid artery stenosis 04/02/2015   Vertigo, peripheral 10/17/2014   Benign prostatic hypertrophy with urinary frequency 01/31/2014   Encounter for Medicare annual wellness exam 07/02/2013   Obesity 04/03/2013   Other malaise and fatigue 09/21/2012   Hyperlipidemia    Hypertension    3-vessel coronary artery disease    S/P CABG x 5     ONSET DATE: 01/20/23  REFERRING DIAG: W41.324 (ICD-10-CM) - Weakness of both lower extremities   THERAPY DIAG:  Unsteadiness on  feet  Abnormality of gait and mobility  Difficulty in walking, not elsewhere classified  Muscle weakness (generalized)  Other low back pain  Other abnormalities of gait and mobility  Weakness of both hips  Other lack of coordination  Rationale for Evaluation and Treatment: Rehabilitation  SUBJECTIVE:                                                                                                                                                                                             SUBJECTIVE STATEMENT:  Pt reports that he is doing well, no pain on this day. Reports that back was tight over the weekend. Feels a little slow this morning.   Pt accompanied  by: self  PERTINENT HISTORY: PD, history of COVID and significant weakness present following this diagnosis and the use of paxlovid ( August 5 dx date, thinks Paxlovid may have exacerbated PD symptoms)   Pt reports having COVID and having significant weakness.  Patient reports he also has back pain that is not present for a long time but was exacerbated more recently.  Patient previously did physical therapy for his back and experienced some relief but has not been consistent with the exercises.  Patient also has history of going to Parkinson's rock steady classes multiple times per week prior to onset of his COVID but he has not been back since. Patient previously ambulated without an assistive device but is now ambulating with a straight point cane.  Patient reports increased foot and ankle weakness in comparison with his hips and knees.  Patient also reports low back pain that is exacerbated with prolonged standing. PAIN:  Are you having pain? Yes: NPRS scale: 6-7/10 Pain location: lower back  Pain description: pin when standing upright for prolonged periods  Aggravating factors: standing prolonged periods  Relieving factors: sitting completely alleviates pain   PRECAUTIONS: Fall  RED FLAGS: None   WEIGHT BEARING RESTRICTIONS: No  FALLS: Has patient fallen in last 6 months? Yes. Number of falls 2-3  LIVING ENVIRONMENT: Lives with: lives with their family and lives with their spouse Lives in: House/apartment Stairs: Yes: Internal: 15 steps; on left going up and External: 1 steps; none Has following equipment at home: Quad cane large base  PLOF: Independent  PATIENT GOALS: To improve strength to prior to his COVID diagnosis   OBJECTIVE:   DIAGNOSTIC FINDINGS: n/a  COGNITION: Overall cognitive status: Within functional limits for tasks assessed   SENSATION: Not tested      POSTURE: rounded shoulders, forward head, and posterior pelvic tilt   LOWER EXTREMITY MMT:     MMT Right Eval Left  Eval  Hip flexion 4 4  Hip extension    Hip abduction 4 4  Hip adduction 4 4  Hip internal rotation    Hip external rotation    Knee flexion 4+ 4+  Knee extension 4+ 4+  Ankle dorsiflexion 4 4  Ankle plantarflexion    Ankle inversion 4 4  Ankle eversion 4 4  (Blank rows = not tested)  BED MOBILITY:  No limitations per pt   TRANSFERS: Assistive device utilized: None  Sit to stand: Complete Independence Stand to sit: Complete Independence Chair to chair: Complete Independence Floor:  Not tested     STAIRS: Level of Assistance: Complete Independence Stair Negotiation Technique: Alternating Pattern  with Bilateral Rails Number of Stairs: 4  Height of Stairs: 6in  Comments:   GAIT: Gait pattern: step through pattern Distance walked: 60 ft Assistive device utilized: None Level of assistance: Complete Independence Comments:   FUNCTIONAL TESTS:  Pt scores 21 / 28 on mini BEST balance test. Scores < 16 indicate increased risk for falls Para [place], [place], & [place]) and MCID is 4 (Godi,et al, 2013)      10 Meter Walk Test: Patient instructed to walk 10 meters (32.8 ft) as quickly and as safely as possible at their normal speed x2 Time measured from 2 meter mark to 8 meter mark to accommodate ramp-up and ramp-down.  Normal speed 1: 1 m/s Normal speed 2: 1.01 m/s Average Normal speed: 1 m/s  Cut off scores: <0.4 m/s = household Ambulator, 0.4-0.8 m/s = limited community Ambulator, >0.8 m/s = community Ambulator, >1.2 m/s = crossing a street, <1.0 = increased fall risk MCID 0.05 m/s (small), 0.13 m/s (moderate) (ANPTA Core Set of Outcome Measures for Adults with Neurologic Conditions, 2018)  Five times Sit to Stand Test (FTSS)  TIME: 11.16 sec  Cut off scores indicative of increased fall risk: >12 sec CVA, >16 sec PD, >13 sec vestibular (ANPTA Core Set of Outcome Measures for Adults with Neurologic Conditions, 2018)   PATIENT SURVEYS:  FOTO:  53  TODAY'S TREATMENT:                                                                                                                               NMR:  octane level 4 x 5 min for aerobic priming and for UE and LE reciprocal large movement initiation   Stepping over cane in floor forward x 10 bil Lateral step BLE over bolster Lateral step with 1 LE with 1 UE(1.5# wrist weight) over bolster abduction x 10 bil   Standing on wide airex beam, with 1.5# wrist weights.  Ball raise overhead.  Trunk rotation to tap ball on target x 10 bil   1.5# wrist weight and 2.5# ankle weights.  Side stepping with ball toss off wall  x 15 bil.  Forward reverse gait with wrist and ankle weights 88ft x 4  Stepping over canes and bolsters in floor forward/reverse; leading  with RLE x 2 each and leading with LLE x 2 reach  Side stepping R and L over canes and bolster on floor x 5   PATIENT EDUCATION: Education details: POC .  Pt educated throughout session about proper posture and technique with exercises. Improved exercise technique, movement at target joints, use of target muscles after min to mod verbal, visual, tactile cues.  Person educated: Patient Education method: Explanation Education comprehension: verbalized understanding  HOME EXERCISE PROGRAM: Access Code: [facility] URL: [email or web address] Date: 01/30/2023 Prepared by: Thresa Ross  Exercises - Shoulder extension with resistance - Neutral  - 1 x daily - 7 x weekly - 2 sets - 12 reps - Standing Shoulder Row with Anchored Resistance  - 1 x daily - 7 x weekly - 2 sets - 15 reps - Shoulder External Rotation and Scapular Retraction with Resistance  - 1 x daily - 7 x weekly - 2 sets - 10 reps - Seated Shoulder Horizontal Abduction with Resistance - Palms Down  - 1 x daily - 7 x weekly - 2 sets - 12 reps -Standing PWR! Moves x 10 ea (UP, ROCK, TWIST,STEP)  GOALS: Goals reviewed with patient? Yes  SHORT TERM GOALS:  Target date: 02/25/2023       Patient will be independent in home exercise program to improve strength/mobility for better functional independence with ADLs. Baseline: No HEP currently  Goal status: INITIAL   LONG TERM GOALS: Target date: 03/25/2023      1.  Patient will increase FOTO score to equal to or greater than  62   to demonstrate statistically significant improvement in mobility and quality of life.  Baseline: 53 Goal status: INITIAL   2.  Patient will increase Mini BEST Balance score by > 4  points to demonstrate decreased fall risk during functional activities. Baseline: 21 Goal status: INITIAL   3.   Patient will improve LE muscle strength to > 4+/5 in hip, knee and ankle strength in order to indicate return to prior level of LE strength  Baseline: see eval chart  Goal status: INITIAL  5.   Patient will increase six minute walk test distance to >1100 with pain less than 5/10 throughout for progression to community ambulator and improve gait ability without discomfort Baseline: 890 ft in 5 minutes, througohut test but more profoundly at 4 minutes pt began to have stooped kyphotic posture. This worsened and was followed by back pain (6/10) that caused the test to be suspended at 5 minutes.  Goal status: INITIAL    ASSESSMENT:  CLINICAL IMPRESSION:  Continued with current plan of care as laid out in evaluation and recent prior sessions. Pt remains motivated to advance progress toward goals in order to maximize independence and safety at home. PT treatment focused on dynamic balance and large amplitude movements with min cues for posture and increased force of muscle activation with ball toss and step length in various planes to navigate obstacles with ankle weights in place. Pt will continue to benefit from skilled physical therapy intervention to address impairments, improve QOL, and attain therapy goals.       OBJECTIVE IMPAIRMENTS: decreased activity  tolerance, decreased balance, decreased mobility, difficulty walking, and decreased strength.   ACTIVITY LIMITATIONS: standing, squatting, stairs, and locomotion level  PARTICIPATION LIMITATIONS: community activity  PERSONAL FACTORS: 3+ comorbidities: PD, HLD, HTN, Back pain  are also affecting patient's functional outcome.   REHAB POTENTIAL: Good  CLINICAL DECISION MAKING: Stable/uncomplicated  EVALUATION COMPLEXITY: Low  PLAN:  PT  FREQUENCY: 2x/week  PT DURATION: 12 weeks  PLANNED INTERVENTIONS: Therapeutic exercises, Therapeutic activity, Neuromuscular re-education, Balance training, Gait training, Patient/Family education, Self Care, Joint mobilization, Joint manipulation, Spinal manipulation, Spinal mobilization, Moist heat, and Manual therapy  PLAN FOR NEXT SESSION:   Continue Antigravity extension activities to improve posture with prolonged ambulation, PD specific exercises with balance focus and postural focus.   Golden Pop PT ,DPT Physical Therapist- [facility]   10:19 AM 03/02/23

## 2023-03-02 NOTE — Patient Instructions (Addendum)
Medication Instructions:  Your Physician recommend you continue on your current medication as directed.    *If you need a refill on your cardiac medications before your next appointment, please call your pharmacy*   Lab Work: None If you have labs (blood work) drawn today and your tests are completely normal, you will receive your results only by: MyChart Message (if you have MyChart) OR A paper copy in the mail If you have any lab test that is abnormal or we need to change your treatment, we will call you to review the results.  TESTING: Your physician has requested that you have a carotid duplex. This test is an ultrasound of the carotid arteries in your neck. It looks at blood flow through these arteries that supply the brain with blood.   Allow one hour for this exam.  There are no restrictions or special instructions.  This will take place at [address], [place] [address]    Follow-Up: At [facility], you and your health needs are our priority.  As part of our continuing mission to provide you with exceptional heart care, we have created designated Provider Care Teams.  These Care Teams include your primary Cardiologist (physician) and Advanced Practice Providers (APPs -  Physician Assistants and Nurse Practitioners) who all work together to provide you with the care you need, when you need it.  We recommend signing up for the patient portal called "MyChart".  Sign up information is provided on this After Visit Summary.  MyChart is used to connect with patients for Virtual Visits (Telemedicine).  Patients are able to view lab/test results, encounter notes, upcoming appointments, etc.  Non-urgent messages can be sent to your provider as well.   To learn more about what you can do with MyChart, go to [email or web address].    Your next appointment:   6 month(s)  Provider:   You may see Lorine Bears, MD or one of the following  Advanced Practice Providers on your designated Care Team:

## 2023-03-04 ENCOUNTER — Ambulatory Visit: Payer: Medicare Other | Admitting: Physical Therapy

## 2023-03-04 DIAGNOSIS — R262 Difficulty in walking, not elsewhere classified: Secondary | ICD-10-CM | POA: Diagnosis not present

## 2023-03-04 DIAGNOSIS — M6281 Muscle weakness (generalized): Secondary | ICD-10-CM

## 2023-03-04 DIAGNOSIS — R2681 Unsteadiness on feet: Secondary | ICD-10-CM

## 2023-03-04 DIAGNOSIS — R2689 Other abnormalities of gait and mobility: Secondary | ICD-10-CM | POA: Diagnosis not present

## 2023-03-04 DIAGNOSIS — R269 Unspecified abnormalities of gait and mobility: Secondary | ICD-10-CM | POA: Diagnosis not present

## 2023-03-04 DIAGNOSIS — M5459 Other low back pain: Secondary | ICD-10-CM | POA: Diagnosis not present

## 2023-03-04 NOTE — Therapy (Signed)
OUTPATIENT PHYSICAL THERAPY NEURO TREATMENT   Patient Name: Johnathan Arnold. MRN: [id number] DOB:May 18, 1946, 77 y.o., male Today's Date: 03/04/2023   PCP: [name], MD   REFERRING PROVIDER: [name], MD   END OF SESSION:  PT End of Session - 03/04/23 0800     Visit Number 9    Number of Visits 16    Date for PT Re-Evaluation 03/25/23    Authorization Type Medicare    Progress Note Due on Visit 10    PT Start Time 0801    PT Stop Time 0843    PT Time Calculation (min) 42 min    Equipment Utilized During Treatment Gait belt    Activity Tolerance Patient tolerated treatment well    Behavior During Therapy [facility] for tasks assessed/performed                Past Medical History:  Diagnosis Date   3-vessel coronary artery disease    s/p  5 vessel CABG   Diabetes mellitus without complication (HCC)    History of cardiac catheterization 2011   [facility]   Hyperlipidemia    Hypertension    Hypertriglyceridemia    Parkinson's disease    Pneumonia 12/28/2020   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 LIMA to LAD, SVG to D1, OM1 and RPDA   CORONARY ARTERY BYPASS GRAFT  03/1998   5 vessel, [facility]   RIGHT HEART CATH N/A 04/04/2019   Procedure: RIGHT HEART CATH;  Surgeon: Iran Ouch, MD;  Location: [facility] INVASIVE CV LAB;  Service: Cardiovascular;  Laterality: N/A;   RIGHT/LEFT HEART CATH AND CORONARY ANGIOGRAPHY N/A 02/01/2018   Procedure: RIGHT/LEFT HEART CATH AND CORONARY ANGIOGRAPHY;  Surgeon: Iran Ouch, MD;  Location: [facility] INVASIVE CV LAB;  Service: Cardiovascular;  Laterality: N/A;   Patient Active Problem List   Diagnosis Date Noted   Adverse drug reaction 01/20/2023   History of COVID-19 03/18/2022   History of diabetes mellitus, type II 03/18/2022   Hospital discharge follow-up 05/16/2021   Generalized weakness 05/07/2021    Fall    Head injury    Bradycardia    Anemia, unspecified 02/07/2021   Neutropenia (HCC) 01/29/2021   Thrombocytopenia (HCC) 01/29/2021   Hyponatremia 12/22/2020   Bilateral leg weakness 12/20/2020   Prostate cancer screening 09/08/2020   Mild neurocognitive disorder due to Parkinson's disease 09/06/2020   Low back pain 08/19/2019   Pulmonary hypertension (HCC)    Insomnia 12/21/2018   Sleep apnea in adult 12/09/2018   Periodic limb movement disorder 12/09/2018   Pulmonary nodules 05/18/2018   Wears hearing aid in both ears 05/17/2018   Leg pain, bilateral 02/20/2018   Dyspnea    CKD (chronic kidney disease) stage 3, GFR 30-59 ml/min (HCC) 09/21/2017   History of skin cancer in adulthood 08/14/2016   Parkinson's disease 08/09/2016   Bilateral carotid artery stenosis 04/02/2015   Vertigo, peripheral 10/17/2014   Benign prostatic hypertrophy with urinary frequency 01/31/2014   Encounter for Medicare annual wellness exam 07/02/2013   Obesity 04/03/2013   Other malaise and fatigue 09/21/2012   Hyperlipidemia    Hypertension    3-vessel coronary artery disease    S/P CABG x 5     ONSET DATE: 01/20/23  REFERRING DIAG: W09.811 (ICD-10-CM) - Weakness of both lower extremities   THERAPY DIAG:  Unsteadiness on  feet  Abnormality of gait and mobility  Difficulty in walking, not elsewhere classified  Muscle weakness (generalized)  Rationale for Evaluation and Treatment: Rehabilitation  SUBJECTIVE:                                                                                                                                                                                             SUBJECTIVE STATEMENT:  Pt reports that he is doing well, no pain on this day. Reports no changes since last session.   Pt accompanied by: self  PERTINENT HISTORY: PD, history of COVID and significant weakness present following this diagnosis and the use of paxlovid ( August 5 dx date, thinks  Paxlovid may have exacerbated PD symptoms)   Pt reports having COVID and having significant weakness.  Patient reports he also has back pain that is not present for a long time but was exacerbated more recently.  Patient previously did physical therapy for his back and experienced some relief but has not been consistent with the exercises.  Patient also has history of going to Parkinson's rock steady classes multiple times per week prior to onset of his COVID but he has not been back since. Patient previously ambulated without an assistive device but is now ambulating with a straight point cane.  Patient reports increased foot and ankle weakness in comparison with his hips and knees.  Patient also reports low back pain that is exacerbated with prolonged standing. PAIN:  Are you having pain? Yes: NPRS scale: 6-7/10 Pain location: lower back  Pain description: pin when standing upright for prolonged periods  Aggravating factors: standing prolonged periods  Relieving factors: sitting completely alleviates pain   PRECAUTIONS: Fall  RED FLAGS: None   WEIGHT BEARING RESTRICTIONS: No  FALLS: Has patient fallen in last 6 months? Yes. Number of falls 2-3  LIVING ENVIRONMENT: Lives with: lives with their family and lives with their spouse Lives in: House/apartment Stairs: Yes: Internal: 15 steps; on left going up and External: 1 steps; none Has following equipment at home: Quad cane large base  PLOF: Independent  PATIENT GOALS: To improve strength to prior to his COVID diagnosis   OBJECTIVE:   DIAGNOSTIC FINDINGS: n/a  COGNITION: Overall cognitive status: Within functional limits for tasks assessed   SENSATION: Not tested      POSTURE: rounded shoulders, forward head, and posterior pelvic tilt   LOWER EXTREMITY MMT:    MMT Right Eval Left Eval  Hip flexion 4 4  Hip extension    Hip abduction 4 4  Hip adduction 4 4  Hip internal rotation    Hip external rotation  Knee  flexion 4+ 4+  Knee extension 4+ 4+  Ankle dorsiflexion 4 4  Ankle plantarflexion    Ankle inversion 4 4  Ankle eversion 4 4  (Blank rows = not tested)  BED MOBILITY:  No limitations per pt   TRANSFERS: Assistive device utilized: None  Sit to stand: Complete Independence Stand to sit: Complete Independence Chair to chair: Complete Independence Floor:  Not tested     STAIRS: Level of Assistance: Complete Independence Stair Negotiation Technique: Alternating Pattern  with Bilateral Rails Number of Stairs: 4  Height of Stairs: 6in  Comments:   GAIT: Gait pattern: step through pattern Distance walked: 60 ft Assistive device utilized: None Level of assistance: Complete Independence Comments:   FUNCTIONAL TESTS:  Pt scores 21 / 28 on mini BEST balance test. Scores < 16 indicate increased risk for falls Para March, [place], & [place]) and MCID is 4 (Godi,et al, 2013)      10 Meter Walk Test: Patient instructed to walk 10 meters (32.8 ft) as quickly and as safely as possible at their normal speed x2 Time measured from 2 meter mark to 8 meter mark to accommodate ramp-up and ramp-down.  Normal speed 1: 1 m/s Normal speed 2: 1.01 m/s Average Normal speed: 1 m/s  Cut off scores: <0.4 m/s = household Ambulator, 0.4-0.8 m/s = limited community Ambulator, >0.8 m/s = community Ambulator, >1.2 m/s = crossing a street, <1.0 = increased fall risk MCID 0.05 m/s (small), 0.13 m/s (moderate) (ANPTA Core Set of Outcome Measures for Adults with Neurologic Conditions, 2018)  Five times Sit to Stand Test (FTSS)  TIME: 11.16 sec  Cut off scores indicative of increased fall risk: >12 sec CVA, >16 sec PD, >13 sec vestibular (ANPTA Core Set of Outcome Measures for Adults with Neurologic Conditions, 2018)   PATIENT SURVEYS:  FOTO: 53  TODAY'S TREATMENT:                                                                                                                               NMR:   octane level 3 x 6 min for aerobic priming and for UE and LE reciprocal large movement initiation   TE  TRX row 2 x 10, about 45 degree in end point of row   Trx postural extension exercise ( step forward facing away from anchor point and allow arms to go into horizontal abduction)  2 x 10   NMR Seated PWR! Up with RTB resistance 2 x 10 reps  Seated PWR! Rock with 1.5# wrist weights x 10 ea  Seated PWR! Rock with 1.5# wrist weights x 10 ea side   Standing on wide airex beam, with 1.5# wrist weights.  -PWR! Step 2 x 10 ea side   1.5# wrist weight and 2.5# ankle weights.  Standing PWR! Step over cane with flow to PWR! Twist x 10 ea side   TE 4# wrist weights donned 2 x 10 horizontal abduction  in bent over row posture with focus on posture ( bending at hips) throughout   PATIENT EDUCATION: Education details: POC .  Pt educated throughout session about proper posture and technique with exercises. Improved exercise technique, movement at target joints, use of target muscles after min to mod verbal, visual, tactile cues.  Person educated: Patient Education method: Explanation Education comprehension: verbalized understanding  HOME EXERCISE PROGRAM: Access Code: [facility] URL: [email or web address] Date: 01/30/2023 Prepared by: Thresa Ross  Exercises - Shoulder extension with resistance - Neutral  - 1 x daily - 7 x weekly - 2 sets - 12 reps - Standing Shoulder Row with Anchored Resistance  - 1 x daily - 7 x weekly - 2 sets - 15 reps - Shoulder External Rotation and Scapular Retraction with Resistance  - 1 x daily - 7 x weekly - 2 sets - 10 reps - Seated Shoulder Horizontal Abduction with Resistance - Palms Down  - 1 x daily - 7 x weekly - 2 sets - 12 reps -Standing PWR! Moves x 10 ea (UP, ROCK, TWIST,STEP)  GOALS: Goals reviewed with patient? Yes  SHORT TERM GOALS: Target date: 02/25/2023       Patient will be independent in home exercise program to  improve strength/mobility for better functional independence with ADLs. Baseline: No HEP currently  Goal status: INITIAL   LONG TERM GOALS: Target date: 03/25/2023      1.  Patient will increase FOTO score to equal to or greater than  62   to demonstrate statistically significant improvement in mobility and quality of life.  Baseline: 53 Goal status: INITIAL   2.  Patient will increase Mini BEST Balance score by > 4  points to demonstrate decreased fall risk during functional activities. Baseline: 21 Goal status: INITIAL   3.   Patient will improve LE muscle strength to > 4+/5 in hip, knee and ankle strength in order to indicate return to prior level of LE strength  Baseline: see eval chart  Goal status: INITIAL  5.   Patient will increase six minute walk test distance to >1100 with pain less than 5/10 throughout for progression to community ambulator and improve gait ability without discomfort Baseline: 890 ft in 5 minutes, througohut test but more profoundly at 4 minutes pt began to have stooped kyphotic posture. This worsened and was followed by back pain (6/10) that caused the test to be suspended at 5 minutes.  Goal status: INITIAL    ASSESSMENT:  CLINICAL IMPRESSION:  Continued with current plan of care as laid out in evaluation and recent prior sessions. Pt remains motivated to advance progress toward goals in order to maximize independence and safety at home. PT treatment focussed on postural strength and endurance and PD specific movements. PWR! Up works on postural strengthening and antigravity extension, PRW! Rock working on [employer] shifting, PRW! Twist targeting trunk rotation and PRW! Step targeting transition movements. PWR! Moves target bradykinesia, rigidity, and dyskinesia through targeted functional movements that address four core movement difficulties for people with Parkinson's disease.  Pt will continue to benefit from skilled physical therapy  intervention to address impairments, improve QOL, and attain therapy goals.       OBJECTIVE IMPAIRMENTS: decreased activity tolerance, decreased balance, decreased mobility, difficulty walking, and decreased strength.   ACTIVITY LIMITATIONS: standing, squatting, stairs, and locomotion level  PARTICIPATION LIMITATIONS: community activity  PERSONAL FACTORS: 3+ comorbidities: PD, HLD, HTN, Back pain  are also affecting patient's functional outcome.   REHAB POTENTIAL: Good  CLINICAL DECISION MAKING: Stable/uncomplicated  EVALUATION COMPLEXITY: Low  PLAN:  PT FREQUENCY: 2x/week  PT DURATION: 12 weeks  PLANNED INTERVENTIONS: Therapeutic exercises, Therapeutic activity, Neuromuscular re-education, Balance training, Gait training, Patient/Family education, Self Care, Joint mobilization, Joint manipulation, Spinal manipulation, Spinal mobilization, Moist heat, and Manual therapy  PLAN FOR NEXT SESSION:   Continue Antigravity extension activities to improve posture with prolonged ambulation, PD specific exercises with balance focus and postural focus.   Norman Herrlich PT ,DPT Physical Therapist- [facility]  8:08 AM 03/04/23

## 2023-03-05 ENCOUNTER — Other Ambulatory Visit: Payer: Self-pay | Admitting: Cardiovascular Disease

## 2023-03-05 ENCOUNTER — Other Ambulatory Visit: Payer: Self-pay | Admitting: Neurology

## 2023-03-05 DIAGNOSIS — G20A1 Parkinson's disease without dyskinesia, without mention of fluctuations: Secondary | ICD-10-CM

## 2023-03-11 ENCOUNTER — Encounter: Payer: Self-pay | Admitting: Physical Therapy

## 2023-03-11 ENCOUNTER — Ambulatory Visit: Payer: Medicare Other | Attending: Internal Medicine | Admitting: Physical Therapy

## 2023-03-11 DIAGNOSIS — R2681 Unsteadiness on feet: Secondary | ICD-10-CM | POA: Diagnosis not present

## 2023-03-11 DIAGNOSIS — R262 Difficulty in walking, not elsewhere classified: Secondary | ICD-10-CM | POA: Insufficient documentation

## 2023-03-11 DIAGNOSIS — R278 Other lack of coordination: Secondary | ICD-10-CM | POA: Diagnosis not present

## 2023-03-11 DIAGNOSIS — M6281 Muscle weakness (generalized): Secondary | ICD-10-CM | POA: Diagnosis not present

## 2023-03-11 DIAGNOSIS — R269 Unspecified abnormalities of gait and mobility: Secondary | ICD-10-CM | POA: Insufficient documentation

## 2023-03-11 NOTE — Therapy (Addendum)
OUTPATIENT PHYSICAL THERAPY NEURO TREATMENT /Physical Therapy Progress Note   Dates of reporting period  01/28/23   to   03/11/23    Patient Name: Johnathan Arnold. MRN: [id number] DOB:12/31/1945, 77 y.o., male Today's Date: 03/11/2023   PCP: [name], MD   REFERRING PROVIDER: [name], MD   END OF SESSION:  PT End of Session - 03/11/23 1027     Visit Number 10    Number of Visits 16    Date for PT Re-Evaluation 03/25/23    Authorization Type Medicare    Progress Note Due on Visit 10    PT Start Time 1018    PT Stop Time 1100    PT Time Calculation (min) 42 min    Equipment Utilized During Treatment Gait belt    Activity Tolerance Patient tolerated treatment well    Behavior During Therapy WFL for tasks assessed/performed                  Past Medical History:  Diagnosis Date   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 LIMA to LAD, SVG to D1, OM1 and RPDA   CORONARY ARTERY BYPASS GRAFT  03/1998   5 vessel, [facility]   RIGHT HEART CATH N/A 04/04/2019   Procedure: RIGHT HEART CATH;  Surgeon: Iran Ouch, MD;  Location: [facility] INVASIVE CV LAB;  Service: Cardiovascular;  Laterality: N/A;   RIGHT/LEFT HEART CATH AND CORONARY ANGIOGRAPHY N/A 02/01/2018   Procedure: RIGHT/LEFT HEART CATH AND CORONARY ANGIOGRAPHY;  Surgeon: Iran Ouch, MD;  Location: [facility] INVASIVE CV LAB;  Service: Cardiovascular;  Laterality: N/A;   Patient Active Problem List   Diagnosis Date Noted   Adverse drug reaction 01/20/2023   History of COVID-19 03/18/2022   History of diabetes mellitus,  type II 03/18/2022   Hospital discharge follow-up 05/16/2021   Generalized weakness 05/07/2021   Fall    Head injury    Bradycardia    Anemia, unspecified 02/07/2021   Neutropenia (HCC) 01/29/2021   Thrombocytopenia (HCC) 01/29/2021   Hyponatremia 12/22/2020   Bilateral leg weakness 12/20/2020   Prostate cancer screening 09/08/2020   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) 09/06/2020   Low back pain 08/19/2019   Pulmonary hypertension (HCC)    Insomnia 12/21/2018   Sleep apnea in adult 12/09/2018   Periodic limb movement disorder 12/09/2018   Pulmonary nodules 05/18/2018   Wears hearing aid in both ears 05/17/2018   Leg pain, bilateral 02/20/2018   Dyspnea    CKD (chronic kidney disease) stage 3, GFR 30-59 ml/min (HCC) 09/21/2017   History of skin cancer in adulthood 08/14/2016   Parkinson's disease (HCC) 08/09/2016   Bilateral carotid artery stenosis 04/02/2015   Vertigo, peripheral 10/17/2014   Benign prostatic hyperplasia with urinary frequency 01/31/2014   Encounter for Medicare annual wellness exam 07/02/2013   Obesity 04/03/2013   Other malaise and fatigue 09/21/2012   Hyperlipidemia    Hypertension    3-vessel coronary artery disease    S/P CABG x 5  ONSET DATE: 01/20/23  REFERRING DIAG: Z61.096 (ICD-10-CM) - Weakness of both lower extremities   THERAPY DIAG:  Unsteadiness on feet  Abnormality of gait and mobility  Difficulty in walking, not elsewhere classified  Muscle weakness (generalized)  Rationale for Evaluation and Treatment: Rehabilitation  SUBJECTIVE:                                                                                                                                                                                             SUBJECTIVE STATEMENT:  Pt reports that he is doing well, had some increase in pain over the weekend after moving a potted plant. Pain is now subsided.   Pt accompanied by:  self  PERTINENT HISTORY: PD, history of COVID and significant weakness present following this diagnosis and the use of paxlovid ( August 5 dx date, thinks Paxlovid may have exacerbated PD symptoms)   Pt reports having COVID and having significant weakness.  Patient reports he also has back pain that is not present for a long time but was exacerbated more recently.  Patient previously did physical therapy for his back and experienced some relief but has not been consistent with the exercises.  Patient also has history of going to Parkinson's rock steady classes multiple times per week prior to onset of his COVID but he has not been back since. Patient previously ambulated without an assistive device but is now ambulating with a straight point cane.  Patient reports increased foot and ankle weakness in comparison with his hips and knees.  Patient also reports low back pain that is exacerbated with prolonged standing. PAIN:  Are you having pain? Yes: NPRS scale: 6-7/10 Pain location: lower back  Pain description: pin when standing upright for prolonged periods  Aggravating factors: standing prolonged periods  Relieving factors: sitting completely alleviates pain   PRECAUTIONS: Fall  RED FLAGS: None   WEIGHT BEARING RESTRICTIONS: No  FALLS: Has patient fallen in last 6 months? Yes. Number of falls 2-3  LIVING ENVIRONMENT: Lives with: lives with their family and lives with their spouse Lives in: House/apartment Stairs: Yes: Internal: 15 steps; on left going up and External: 1 steps; none Has following equipment at home: Quad cane large base  PLOF: Independent  PATIENT GOALS: To improve strength to prior to his COVID diagnosis   OBJECTIVE:   DIAGNOSTIC FINDINGS: n/a  COGNITION: Overall cognitive status: Within functional limits for tasks assessed   SENSATION: Not tested      POSTURE: rounded shoulders, forward head, and posterior pelvic tilt   LOWER EXTREMITY MMT:    MMT  Right Eval Left Eval Right Eval  Left Eval   Hip flexion 4 4 4+ 4+  Hip extension      Hip abduction 4 4 5 5   Hip adduction 4 4 5 5   Hip internal rotation      Hip external rotation      Knee flexion 4+ 4+ 4+ 4+  Knee extension 4+ 4+ 4+ 4+  Ankle dorsiflexion 4 4 4+ 4+  Ankle plantarflexion      Ankle inversion 4 4 4+ 4+  Ankle eversion 4 4 4+ 4+  (Blank rows = not tested)  BED MOBILITY:  No limitations per pt   TRANSFERS: Assistive device utilized: None  Sit to stand: Complete Independence Stand to sit: Complete Independence Chair to chair: Complete Independence Floor:  Not tested     STAIRS: Level of Assistance: Complete Independence Stair Negotiation Technique: Alternating Pattern  with Bilateral Rails Number of Stairs: 4  Height of Stairs: 6in  Comments:   GAIT: Gait pattern: step through pattern Distance walked: 60 ft Assistive device utilized: None Level of assistance: Complete Independence Comments:   FUNCTIONAL TESTS:  Pt scores 21 / 28 on mini BEST balance test. Scores < 16 indicate increased risk for falls Para March, Hoffman, & [place]) and MCID is 4 (Godi,et al, 2013)      10 Meter Walk Test: Patient instructed to walk 10 meters (32.8 ft) as quickly and as safely as possible at their normal speed x2 Time measured from 2 meter mark to 8 meter mark to accommodate ramp-up and ramp-down.  Normal speed 1: 1 m/s Normal speed 2: 1.01 m/s Average Normal speed: 1 m/s  Cut off scores: <0.4 m/s = household Ambulator, 0.4-0.8 m/s = limited community Ambulator, >0.8 m/s = community Ambulator, >1.2 m/s = crossing a street, <1.0 = increased fall risk MCID 0.05 m/s (small), 0.13 m/s (moderate) (ANPTA Core Set of Outcome Measures for Adults with Neurologic Conditions, 2018)  Five times Sit to Stand Test (FTSS)  TIME: 11.16 sec  Cut off scores indicative of increased fall risk: >12 sec CVA, >16 sec PD, >13 sec vestibular (ANPTA Core Set of Outcome Measures for  Adults with Neurologic Conditions, 2018)   PATIENT SURVEYS:  FOTO: 53  TODAY'S TREATMENT:                                                                                                                               NMR:  octane level 3 x 6 min for aerobic priming and for UE and LE reciprocal large movement initiation   Physical therapy treatment session today consisted of completing assessment of goals and administration of testing as demonstrated and documented in flow sheet, treatment, and goals section of this note. Addition treatments may be found below.   990 ft with LBP reaching 5/10 at 5:30  -hunched/kyphotic posture worsened with prolonged ambulation    [facility] PT Assessment - 03/11/23 0001       Mini-BESTest   Sit To  Stand Normal: Comes to stand without use of hands and stabilizes independently.    Rise to Toes Moderate: Heels up, but not full range (smaller than when holding hands), OR noticeable instability for 3 s.    Stand on one leg (left) Normal: 20 s.    Stand on one leg (right) Normal: 20 s.    Stand on one leg - lowest score 2    Compensatory Stepping Correction - Forward Normal: Recovers independently with a single, large step (second realignement is allowed).    Compensatory Stepping Correction - Backward Moderate: More than one step is required to recover equilibrium    Compensatory Stepping Correction - Left Lateral Normal: Recovers independently with 1 step (crossover or lateral OK)    Compensatory Stepping Correction - Right Lateral Normal: Recovers independently with 1 step (crossover or lateral OK)    Stepping Corredtion Lateral - lowest score 2    Stance - Feet together, eyes open, firm surface  Normal: 30s    Stance - Feet together, eyes closed, foam surface  Normal: 30s    Incline - Eyes Closed Normal: Stands independently 30s and aligns with gravity    Change in Gait Speed Normal: Significantly changes walkling speed without imbalance    Walk with  head turns - Horizontal Moderate: performs head turns with reduction in gait speed.    Walk with pivot turns Normal: Turns with feet close FAST (< 3 steps) with good balance.    Step over obstacles Normal: Able to step over box with minimal change of gait speed and with good balance.    Timed UP & GO with Dual Task Moderate: Dual Task affects either counting OR walking (>10%) when compared to the TUG without Dual Task.    Mini-BEST total score 24              PATIENT EDUCATION: Education details: POC .  Pt educated throughout session about proper posture and technique with exercises. Improved exercise technique, movement at target joints, use of target muscles after min to mod verbal, visual, tactile cues.  Person educated: Patient Education method: Explanation Education comprehension: verbalized understanding  HOME EXERCISE PROGRAM: Access Code: [facility] URL: [email or web address] Date: 01/30/2023 Prepared by: Thresa Ross  Exercises - Shoulder extension with resistance - Neutral  - 1 x daily - 7 x weekly - 2 sets - 12 reps - Standing Shoulder Row with Anchored Resistance  - 1 x daily - 7 x weekly - 2 sets - 15 reps - Shoulder External Rotation and Scapular Retraction with Resistance  - 1 x daily - 7 x weekly - 2 sets - 10 reps - Seated Shoulder Horizontal Abduction with Resistance - Palms Down  - 1 x daily - 7 x weekly - 2 sets - 12 reps -Standing PWR! Moves x 10 ea (UP, ROCK, TWIST,STEP)  GOALS: Goals reviewed with patient? Yes  SHORT TERM GOALS: Target date: 02/25/2023       Patient will be independent in home exercise program to improve strength/mobility for better functional independence with ADLs. Baseline: No HEP currently  10/2: Was doing them but has not been lately  Goal status: NOT MET    LONG TERM GOALS: Target date: 03/25/2023   1.  Patient will increase FOTO score to equal to or greater than  62   to demonstrate statistically  significant improvement in mobility and quality of life.  Baseline: 53  10/2:52 Goal status: ONGOING   2.  Patient will increase Mini BEST Balance  score by > 4  points to demonstrate decreased fall risk during functional activities. Baseline: 21 Goal status: ONGOING   3.   Patient will improve LE muscle strength to > 4+/5 in hip, knee and ankle strength in order to indicate return to prior level of LE strength  Baseline: see eval chart  Goal status: MET  5.   Patient will increase six minute walk test distance to >1100 with pain less than 5/10 throughout for progression to community ambulator and improve gait ability without discomfort Baseline: 890 ft in 5 minutes, througohut test but more profoundly at 4 minutes pt began to have stooped kyphotic posture. This worsened and was followed by back pain (6/10) that caused the test to be suspended at 5 minutes.  10/2: 990 ft with similar progression of flexed posture throughout. Pain 5/10 at 5:30  Goal status: INITIAL    ASSESSMENT:  CLINICAL IMPRESSION:  Patient presents to physical therapy for progress note this date.  Patient shows progress with both balance and manual muscle testing goals showing improved safety with ambulation as well as improved lower extremity strength.  Patient makes some progress with 6-minute walk test but still has increase in back pain and kyphotic posture that worsens with prolonged ambulation.  Patient instructed to be more diligent with his home exercise program to attenuate progress with this goal.  Patient's focus on therapeutic outcome survey goal did not show any improvements, however objective balance measures to show improvements in patient's mobility.  Patient will continue to benefit from skilled physical therapy to improve his pain and posture with mobility, balance, his confidence with everyday activities.Patient's condition has the potential to improve in response to therapy. Maximum improvement is yet  to be obtained. The anticipated improvement is attainable and reasonable in a generally predictable time.         OBJECTIVE IMPAIRMENTS: decreased activity tolerance, decreased balance, decreased mobility, difficulty walking, and decreased strength.   ACTIVITY LIMITATIONS: standing, squatting, stairs, and locomotion level  PARTICIPATION LIMITATIONS: community activity  PERSONAL FACTORS: 3+ comorbidities: PD, HLD, HTN, Back pain  are also affecting patient's functional outcome.   REHAB POTENTIAL: Good  CLINICAL DECISION MAKING: Stable/uncomplicated  EVALUATION COMPLEXITY: Low  PLAN:  PT FREQUENCY: 2x/week  PT DURATION: 12 weeks  PLANNED INTERVENTIONS: Therapeutic exercises, Therapeutic activity, Neuromuscular re-education, Balance training, Gait training, Patient/Family education, Self Care, Joint mobilization, Joint manipulation, Spinal manipulation, Spinal mobilization, Moist heat, and Manual therapy  PLAN FOR NEXT SESSION:   Continue Antigravity extension activities to improve posture with prolonged ambulation, PD specific exercises with balance focus and postural focus.   Norman Herrlich PT ,DPT Physical Therapist- [facility]  [facility]  10:27 AM 03/11/23

## 2023-03-12 NOTE — Therapy (Deleted)
OUTPATIENT PHYSICAL THERAPY NEURO TREATMENT     Patient Name: Johnathan Arnold. MRN: [id number] DOB:Oct 31, 1945, 77 y.o., male Today's Date: 03/12/2023   PCP: [name], MD   REFERRING PROVIDER: [name], MD   END OF SESSION:         Past Medical History:  Diagnosis Date   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 LIMA to LAD, SVG to D1, OM1 and RPDA   CORONARY ARTERY BYPASS GRAFT  03/1998   5 vessel, [facility]   RIGHT HEART CATH N/A 04/04/2019   Procedure: RIGHT HEART CATH;  Surgeon: Iran Ouch, MD;  Location: [facility] INVASIVE CV LAB;  Service: Cardiovascular;  Laterality: N/A;   RIGHT/LEFT HEART CATH AND CORONARY ANGIOGRAPHY N/A 02/01/2018   Procedure: RIGHT/LEFT HEART CATH AND CORONARY ANGIOGRAPHY;  Surgeon: Iran Ouch, MD;  Location: [facility] INVASIVE CV LAB;  Service: Cardiovascular;  Laterality: N/A;   Patient Active Problem List   Diagnosis Date Noted   Adverse drug reaction 01/20/2023   History of COVID-19 03/18/2022   History of diabetes mellitus, type II 03/18/2022   Hospital discharge follow-up 05/16/2021   Generalized weakness 05/07/2021   Fall    Head injury    Bradycardia    Anemia, unspecified 02/07/2021   Neutropenia (HCC) 01/29/2021   Thrombocytopenia (HCC) 01/29/2021   Hyponatremia 12/22/2020   Bilateral leg weakness 12/20/2020   Prostate cancer screening 09/08/2020   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) 09/06/2020   Low back pain 08/19/2019   Pulmonary hypertension (HCC)    Insomnia 12/21/2018   Sleep apnea in adult  12/09/2018   Periodic limb movement disorder 12/09/2018   Pulmonary nodules 05/18/2018   Wears hearing aid in both ears 05/17/2018   Leg pain, bilateral 02/20/2018   Dyspnea    CKD (chronic kidney disease) stage 3, GFR 30-59 ml/min (HCC) 09/21/2017   History of skin cancer in adulthood 08/14/2016   Parkinson's disease (HCC) 08/09/2016   Bilateral carotid artery stenosis 04/02/2015   Vertigo, peripheral 10/17/2014   Benign prostatic hyperplasia with urinary frequency 01/31/2014   Encounter for Medicare annual wellness exam 07/02/2013   Obesity 04/03/2013   Other malaise and fatigue 09/21/2012   Hyperlipidemia    Hypertension    3-vessel coronary artery disease    S/P CABG x 5     ONSET DATE: 01/20/23  REFERRING DIAG: O53.664 (ICD-10-CM) - Weakness of both lower extremities   THERAPY DIAG:  No diagnosis found.  Rationale for Evaluation and Treatment: Rehabilitation  SUBJECTIVE:  SUBJECTIVE STATEMENT:  Pt reports that he is doing well, had some increase in pain over the weekend after moving a potted plant. Pain is now subsided.   Pt accompanied by: self  PERTINENT HISTORY: PD, history of COVID and significant weakness present following this diagnosis and the use of paxlovid ( August 5 dx date, thinks Paxlovid may have exacerbated PD symptoms)   Pt reports having COVID and having significant weakness.  Patient reports he also has back pain that is not present for a long time but was exacerbated more recently.  Patient previously did physical therapy for his back and experienced some relief but has not been consistent with the exercises.  Patient also has history of going to Parkinson's rock steady classes multiple times per week prior to onset of his COVID but he has not been back since. Patient  previously ambulated without an assistive device but is now ambulating with a straight point cane.  Patient reports increased foot and ankle weakness in comparison with his hips and knees.  Patient also reports low back pain that is exacerbated with prolonged standing. PAIN:  Are you having pain? Yes: NPRS scale: 6-7/10 Pain location: lower back  Pain description: pin when standing upright for prolonged periods  Aggravating factors: standing prolonged periods  Relieving factors: sitting completely alleviates pain   PRECAUTIONS: Fall  RED FLAGS: None   WEIGHT BEARING RESTRICTIONS: No  FALLS: Has patient fallen in last 6 months? Yes. Number of falls 2-3  LIVING ENVIRONMENT: Lives with: lives with their family and lives with their spouse Lives in: House/apartment Stairs: Yes: Internal: 15 steps; on left going up and External: 1 steps; none Has following equipment at home: Quad cane large base  PLOF: Independent  PATIENT GOALS: To improve strength to prior to his COVID diagnosis   OBJECTIVE:   DIAGNOSTIC FINDINGS: n/a  COGNITION: Overall cognitive status: Within functional limits for tasks assessed   SENSATION: Not tested      POSTURE: rounded shoulders, forward head, and posterior pelvic tilt   LOWER EXTREMITY MMT:    MMT Right Eval Left Eval Right Eval  Left Eval   Hip flexion 4 4 4+ 4+  Hip extension      Hip abduction 4 4 5 5   Hip adduction 4 4 5 5   Hip internal rotation      Hip external rotation      Knee flexion 4+ 4+ 4+ 4+  Knee extension 4+ 4+ 4+ 4+  Ankle dorsiflexion 4 4 4+ 4+  Ankle plantarflexion      Ankle inversion 4 4 4+ 4+  Ankle eversion 4 4 4+ 4+  (Blank rows = not tested)  BED MOBILITY:  No limitations per pt   TRANSFERS: Assistive device utilized: None  Sit to stand: Complete Independence Stand to sit: Complete Independence Chair to chair: Complete Independence Floor:  Not tested     STAIRS: Level of Assistance: Complete  Independence Stair Negotiation Technique: Alternating Pattern  with Bilateral Rails Number of Stairs: 4  Height of Stairs: 6in  Comments:   GAIT: Gait pattern: step through pattern Distance walked: 60 ft Assistive device utilized: None Level of assistance: Complete Independence Comments:   FUNCTIONAL TESTS:  Pt scores 21 / 28 on mini BEST balance test. Scores < 16 indicate increased risk for falls [place], [place], & [place]) and MCID is 4 (Godi,et al, 2013)      10 Meter Walk Test: Patient instructed to walk 10 meters (32.8 ft) as quickly  and as safely as possible at their normal speed x2 Time measured from 2 meter mark to 8 meter mark to accommodate ramp-up and ramp-down.  Normal speed 1: 1 m/s Normal speed 2: 1.01 m/s Average Normal speed: 1 m/s  Cut off scores: <0.4 m/s = household Ambulator, 0.4-0.8 m/s = limited community Ambulator, >0.8 m/s = community Ambulator, >1.2 m/s = crossing a street, <1.0 = increased fall risk MCID 0.05 m/s (small), 0.13 m/s (moderate) (ANPTA Core Set of Outcome Measures for Adults with Neurologic Conditions, 2018)  Five times Sit to Stand Test (FTSS)  TIME: 11.16 sec  Cut off scores indicative of increased fall risk: >12 sec CVA, >16 sec PD, >13 sec vestibular (ANPTA Core Set of Outcome Measures for Adults with Neurologic Conditions, 2018)   PATIENT SURVEYS:  FOTO: 53  TODAY'S TREATMENT:                                                                                                                               NMR:  octane level 3 x 6 min for aerobic priming and for UE and LE reciprocal large movement initiation   Physical therapy treatment session today consisted of completing assessment of goals and administration of testing as demonstrated and documented in flow sheet, treatment, and goals section of this note. Addition treatments may be found below.   990 ft with LBP reaching 5/10 at 5:30  -hunched/kyphotic posture worsened  with prolonged ambulation       PATIENT EDUCATION: Education details: POC .  Pt educated throughout session about proper posture and technique with exercises. Improved exercise technique, movement at target joints, use of target muscles after min to mod verbal, visual, tactile cues.  Person educated: Patient Education method: Explanation Education comprehension: verbalized understanding  HOME EXERCISE PROGRAM: Access Code: [facility] URL: [email or web address] Date: 01/30/2023 Prepared by: Thresa Ross  Exercises - Shoulder extension with resistance - Neutral  - 1 x daily - 7 x weekly - 2 sets - 12 reps - Standing Shoulder Row with Anchored Resistance  - 1 x daily - 7 x weekly - 2 sets - 15 reps - Shoulder External Rotation and Scapular Retraction with Resistance  - 1 x daily - 7 x weekly - 2 sets - 10 reps - Seated Shoulder Horizontal Abduction with Resistance - Palms Down  - 1 x daily - 7 x weekly - 2 sets - 12 reps -Standing PWR! Moves x 10 ea (UP, ROCK, TWIST,STEP)  GOALS: Goals reviewed with patient? Yes  SHORT TERM GOALS: Target date: 02/25/2023       Patient will be independent in home exercise program to improve strength/mobility for better functional independence with ADLs. Baseline: No HEP currently  10/2: Was doing them but has not been lately  Goal status: NOT MET    LONG TERM GOALS: Target date: 03/25/2023   1.  Patient will increase FOTO score to equal to or greater than  62  to demonstrate statistically significant improvement in mobility and quality of life.  Baseline: 53  10/2:52 Goal status: ONGOING   2.  Patient will increase Mini BEST Balance score by > 4  points to demonstrate decreased fall risk during functional activities. Baseline: 21 Goal status: ONGOING   3.   Patient will improve LE muscle strength to > 4+/5 in hip, knee and ankle strength in order to indicate return to prior level of LE strength  Baseline: see eval  chart  Goal status: MET  5.   Patient will increase six minute walk test distance to >1100 with pain less than 5/10 throughout for progression to community ambulator and improve gait ability without discomfort Baseline: 890 ft in 5 minutes, througohut test but more profoundly at 4 minutes pt began to have stooped kyphotic posture. This worsened and was followed by back pain (6/10) that caused the test to be suspended at 5 minutes.  10/2: 990 ft with similar progression of flexed posture throughout. Pain 5/10 at 5:30  Goal status: INITIAL    ASSESSMENT:  CLINICAL IMPRESSION:  Patient presents to physical therapy for progress note this date.  Patient shows progress with both balance and manual muscle testing goals showing improved safety with ambulation as well as improved lower extremity strength.  Patient makes some progress with 6-minute walk test but still has increase in back pain and kyphotic posture that worsens with prolonged ambulation.  Patient instructed to be more diligent with his home exercise program to attenuate progress with this goal.  Patient's focus on therapeutic outcome survey goal did not show any improvements, however objective balance measures to show improvements in patient's mobility.  Patient will continue to benefit from skilled physical therapy to improve his pain and posture with mobility, balance, his confidence with everyday activities.Patient's condition has the potential to improve in response to therapy. Maximum improvement is yet to be obtained. The anticipated improvement is attainable and reasonable in a generally predictable time.         OBJECTIVE IMPAIRMENTS: decreased activity tolerance, decreased balance, decreased mobility, difficulty walking, and decreased strength.   ACTIVITY LIMITATIONS: standing, squatting, stairs, and locomotion level  PARTICIPATION LIMITATIONS: community activity  PERSONAL FACTORS: 3+ comorbidities: PD, HLD, HTN, Back pain   are also affecting patient's functional outcome.   REHAB POTENTIAL: Good  CLINICAL DECISION MAKING: Stable/uncomplicated  EVALUATION COMPLEXITY: Low  PLAN:  PT FREQUENCY: 2x/week  PT DURATION: 12 weeks  PLANNED INTERVENTIONS: Therapeutic exercises, Therapeutic activity, Neuromuscular re-education, Balance training, Gait training, Patient/Family education, Self Care, Joint mobilization, Joint manipulation, Spinal manipulation, Spinal mobilization, Moist heat, and Manual therapy  PLAN FOR NEXT SESSION:   Continue Antigravity extension activities to improve posture with prolonged ambulation, PD specific exercises with balance focus and postural focus.   Norman Herrlich PT ,DPT Physical Therapist- [employer]  [facility]  4:45 PM 03/12/23

## 2023-03-13 ENCOUNTER — Ambulatory Visit: Payer: Medicare Other | Admitting: Physical Therapy

## 2023-03-17 ENCOUNTER — Ambulatory Visit: Payer: Medicare Other | Admitting: Pulmonary Disease

## 2023-03-17 ENCOUNTER — Encounter: Payer: Self-pay | Admitting: Pulmonary Disease

## 2023-03-17 VITALS — BP 128/64 | HR 54 | Temp 98.0°F | Ht 67.0 in | Wt 170.2 lb

## 2023-03-17 DIAGNOSIS — G4733 Obstructive sleep apnea (adult) (pediatric): Secondary | ICD-10-CM

## 2023-03-17 DIAGNOSIS — Z23 Encounter for immunization: Secondary | ICD-10-CM

## 2023-03-17 NOTE — Progress Notes (Unsigned)
Subjective:    Patient ID: Johnathan Arnold., male    DOB: December 19, 1945, 77 y.o.   MRN: [id number]  Patient Care Team: [name], MD as PCP - General (Internal Medicine) Iran Ouch, MD as PCP - Cardiology (Cardiology) Marcina Millard, MD (Internal Medicine) Tat, Octaviano Batty, DO as Consulting Physician (Neurology)  Chief Complaint  Patient presents with   Follow-up    No SOB, wheezing or cough. CPAP mask leaks. Pressure is good.     HPI Johnathan Arnold is a 77 year old lifelong never smoker with a history as noted below who presents today strictly due to issues with his CPAP.  He has known obstructive sleep apnea.  He had not been seen in this clinic since 06 March 2021.  At that time issues with lung nodules had been resolved deeming that these were stable since 2020 and likely benign.  Because of his issues with obstructive sleep apnea he was referred to one of our sleep medicine providers however, he was not scheduled for this.  He presents today because he has been having issues with his CPAP mask leaking has an AutoSet CPAP AirSense 10 set at 5 to 20 cm H2O.  He has poor compliance with the device due to the leaks.  He does not endorse any other symptomatology.  He has not had any dysphagia of late, recall that he had previously had an episode of aspiration with aspiration pneumonitis however he has not had any of these issues recurred.  As noted the previously noted lung nodules have been stable since 2020 and deemed benign.  He has not had any shortness of breath, wheezing or cough.  No chest pain, no lower extremity edema, no calf tenderness.  No orthopnea or paroxysmal nocturnal dyspnea.  No weight loss nor anorexia.  Review of Systems A 10 point review of systems was performed and it is as noted above otherwise negative.   Past Medical History:  Diagnosis Date   3-vessel coronary artery disease    s/p  5 vessel CABG   Diabetes mellitus without complication (HCC)     History of cardiac catheterization 2011   [facility]   Hyperlipidemia    Hypertension    Hypertriglyceridemia    Parkinson's disease (HCC)    Pneumonia 12/28/2020   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 LIMA to LAD, SVG to D1, OM1 and RPDA   CORONARY ARTERY BYPASS GRAFT  03/1998   5 vessel, [facility]   RIGHT HEART CATH N/A 04/04/2019   Procedure: RIGHT HEART CATH;  Surgeon: Iran Ouch, MD;  Location: [facility] INVASIVE CV LAB;  Service: Cardiovascular;  Laterality: N/A;   RIGHT/LEFT HEART CATH AND CORONARY ANGIOGRAPHY N/A 02/01/2018   Procedure: RIGHT/LEFT HEART CATH AND CORONARY ANGIOGRAPHY;  Surgeon: Iran Ouch, MD;  Location: [facility] INVASIVE CV LAB;  Service: Cardiovascular;  Laterality: N/A;    Patient Active Problem List   Diagnosis Date Noted   Adverse drug reaction 01/20/2023   History of COVID-19 03/18/2022   History of diabetes mellitus, type II 03/18/2022   Hospital discharge follow-up 05/16/2021   Generalized weakness 05/07/2021   Fall    Head injury    Bradycardia    Anemia, unspecified 02/07/2021   Neutropenia (HCC) 01/29/2021   Thrombocytopenia (HCC) 01/29/2021   Hyponatremia 12/22/2020   Bilateral leg weakness 12/20/2020  Prostate cancer screening 09/08/2020   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) 09/06/2020   Low back pain 08/19/2019   Pulmonary hypertension (HCC)    Insomnia 12/21/2018   Sleep apnea in adult 12/09/2018   Periodic limb movement disorder 12/09/2018   Pulmonary nodules 05/18/2018   Wears hearing aid in both ears 05/17/2018   Leg pain, bilateral 02/20/2018   Dyspnea    CKD (chronic kidney disease) stage 3, GFR 30-59 ml/min (HCC) 09/21/2017   History of skin cancer in adulthood 08/14/2016   Parkinson's disease (HCC) 08/09/2016   Bilateral carotid artery stenosis 04/02/2015   Vertigo, peripheral  10/17/2014   Benign prostatic hyperplasia with urinary frequency 01/31/2014   Encounter for Medicare annual wellness exam 07/02/2013   Obesity 04/03/2013   Other malaise and fatigue 09/21/2012   Hyperlipidemia    Hypertension    3-vessel coronary artery disease    S/P CABG x 5     Family History  Problem Relation Age of Onset   Heart attack Mother 70   Hypertension Mother    Heart attack Father 28   Heart disease Father    Heart disease Brother    Healthy Son     Social History   Tobacco Use   Smoking status: Never    Passive exposure: Yes   Smokeless tobacco: Never  Substance Use Topics   Alcohol use: Yes    Comment: occasional beer    Allergies  Allergen Reactions   Paxlovid [Nirmatrelvir-Ritonavir]     Current Meds  Medication Sig   aspirin EC 81 MG tablet Take 1 tablet (81 mg total) by mouth at bedtime.   carbidopa-levodopa (SINEMET CR) 50-200 MG tablet TAKE 1 TABLET BY MOUTH AT BEDTIME   carbidopa-levodopa (SINEMET IR) 25-100 MG tablet Take 2 at 8am, 2 at 11am, 2 at 2 pm, 1 at 5pm   clonazePAM (KLONOPIN) 1 MG tablet TAKE 1 TABLET BY MOUTH AT BEDTIME   diazepam (VALIUM) 5 MG tablet Take 5 mg by mouth 3 (three) times daily as needed.   furosemide (LASIX) 20 MG tablet Take 1 tablet (20 mg total) by mouth daily as needed (for swelling).   isosorbide mononitrate (IMDUR) 60 MG 24 hr tablet TAKE 1 TABLET BY MOUTH DAILY   losartan (COZAAR) 25 MG tablet Take 1 tablet (25 mg total) by mouth daily as needed (at night for systolic greater than 140).   melatonin 3 MG TABS tablet Take 1.5 mg by mouth at bedtime as needed.   Multiple Vitamin (MULTIVITAMIN) tablet Take 1 tablet by mouth daily.   nitroGLYCERIN (NITROSTAT) 0.4 MG SL tablet Place 1 tablet (0.4 mg total) under the tongue every 5 (five) minutes as needed for chest pain.   omeprazole (PRILOSEC) 20 MG capsule Take 1 capsule (20 mg total) by mouth every morning.   polyethylene glycol (MIRALAX / GLYCOLAX) packet Take  17 g by mouth daily as needed.   ranolazine (RANEXA) 1000 MG SR tablet TAKE 1 TABLET BY MOUTH 2 TIMES A DAY   rosuvastatin (CRESTOR) 10 MG tablet TAKE 1 TABLET BY MOUTH DAILY   tamsulosin (FLOMAX) 0.4 MG CAPS capsule TAKE ONE CAPSULE BY MOUTH DAILY    Immunization History  Administered Date(s) Administered   Fluad Quad(high Dose 65+) 03/09/2019, 05/16/2021, 03/18/2022   Influenza Inj Mdck Quad Pf 03/01/2020   Influenza Split 02/19/2011, 04/10/2014   Influenza, High Dose Seasonal PF 04/01/2013, 02/28/2015, 05/17/2018   Influenza,inj,Quad PF,6+ Mos 03/10/2016, 03/13/2017   Influenza-Unspecified 03/09/2012  PFIZER(Purple Top)SARS-COV-2 Vaccination 07/15/2019, 08/05/2019, 04/01/2020   Pneumococcal Conjugate-13 06/30/2013   Pneumococcal Polysaccharide-23 05/21/2011, 08/06/2016   Tdap 12/19/2010   Zoster Recombinant(Shingrix) 12/07/2021, 03/25/2022   Zoster, Live 05/09/2012        Objective:     BP 128/64 (BP Location: Right Arm, Cuff Size: Normal)   Pulse (!) 54   SpO2 99%   SpO2: 99 % O2 Device: None (Room air)  GENERAL: Elderly gentleman, no acute distress.  Well-developed, well-nourished, presents fully ambulatory  HEAD: Normocephalic, atraumatic. EYES: Pupils equal, round, reactive to light.  No scleral icterus. MOUTH: Nose/mouth/throat not examined due to masking requirements for COVID 19. NECK: Supple. No thyromegaly. Trachea midline. No JVD.  No adenopathy. PULMONARY: Good air entry bilaterally.  No adventitious sounds. CARDIOVASCULAR: S1 and S2. Regular rate and rhythm.  No rubs, murmurs or gallops heard. ABDOMEN: Benign. MUSCULOSKELETAL: No joint deformity, no clubbing, no edema. NEUROLOGIC: Pill rolling resting tremor noted particularly on the right upper extremity no other focal abnormality.  Gait is unassisted today.  Speech is fluent. SKIN: Intact,warm,dry.  On limited exam no rashes. PSYCH: Flat affect, behavior normal.   Assessment & Plan:     ICD-10-CM    1. OSA on CPAP  G47.33 Cpap titration   Will proceed with titration study Mask fitting during titration study Follow-up with Dr.Kasa with regards to his OSA    2. Need for influenza vaccination  Z23 Flu Vaccine Trivalent High Dose (Fluad)   Received flu vaccine today      Orders Placed This Encounter  Procedures   Flu Vaccine Trivalent High Dose (Fluad)   Cpap titration    Please try Dream Wear Mask    Standing Status:   Future    Standing Expiration Date:   03/16/2024    Order Specific Question:   Where should this test be performed:    Answer:   [place]    The patient has no urine pulmonary issues.  Main issues are related to obstructive sleep apnea.  Will obtain a titration study in lab.  The patient will follow-up with one of our nurse practitioners or with Dr. Belia Heman after the titration study is performed.  He is to contact us prior to that time should any new difficulties arise.   Gailen Shelter, MD Advanced Bronchoscopy PCCM [facility]-    *This note was dictated using voice recognition software/Dragon.  Despite best efforts to proofread, errors can occur which can change the meaning. Any transcriptional errors that result from this process are unintentional and may not be fully corrected at the time of dictation.

## 2023-03-17 NOTE — Patient Instructions (Signed)
We are going to schedule you for a titration study.  This will be in lab.  This way we can see what your optimal pressures are for your CPAP and also they can fit you with a mask that will be appropriate for you.  You received your flu shot today.  We will arrange follow-up here with our nurse practitioners who manage sleep and with Dr. Belia Heman to follow-up on your sleep issues.

## 2023-03-19 ENCOUNTER — Ambulatory Visit: Payer: Medicare Other | Admitting: Internal Medicine

## 2023-03-19 DIAGNOSIS — D2261 Melanocytic nevi of right upper limb, including shoulder: Secondary | ICD-10-CM | POA: Diagnosis not present

## 2023-03-19 DIAGNOSIS — D2271 Melanocytic nevi of right lower limb, including hip: Secondary | ICD-10-CM | POA: Diagnosis not present

## 2023-03-19 DIAGNOSIS — Z85828 Personal history of other malignant neoplasm of skin: Secondary | ICD-10-CM | POA: Diagnosis not present

## 2023-03-19 DIAGNOSIS — D2272 Melanocytic nevi of left lower limb, including hip: Secondary | ICD-10-CM | POA: Diagnosis not present

## 2023-03-19 DIAGNOSIS — L570 Actinic keratosis: Secondary | ICD-10-CM | POA: Diagnosis not present

## 2023-03-19 DIAGNOSIS — L821 Other seborrheic keratosis: Secondary | ICD-10-CM | POA: Diagnosis not present

## 2023-03-19 DIAGNOSIS — Z08 Encounter for follow-up examination after completed treatment for malignant neoplasm: Secondary | ICD-10-CM | POA: Diagnosis not present

## 2023-03-19 DIAGNOSIS — D2262 Melanocytic nevi of left upper limb, including shoulder: Secondary | ICD-10-CM | POA: Diagnosis not present

## 2023-03-19 DIAGNOSIS — D225 Melanocytic nevi of trunk: Secondary | ICD-10-CM | POA: Diagnosis not present

## 2023-03-20 ENCOUNTER — Ambulatory Visit: Payer: Medicare Other | Attending: Physician Assistant

## 2023-03-20 DIAGNOSIS — I6523 Occlusion and stenosis of bilateral carotid arteries: Secondary | ICD-10-CM | POA: Diagnosis not present

## 2023-03-23 NOTE — Progress Notes (Unsigned)
Assessment/Plan:   1.  Parkinsons Disease, with worsening of symptoms when he has had systemic illness  -Patient's Parkinson's really looks adequately controlled today.  -Continue carbidopa/levodopa 25/100 to 2 at 8am, 2 at 11am, 2 at 2 pm, 1 at 5pm  -Continue carbidopa/levodopa 50/200 CR at bedtime.  -proud of him for all the exercise with the RSB, but talk to him about adding core exercises in  -Discussed use of walker and son actually ordered him one while we were in the room.  -We discussed that it used to be thought that levodopa would increase risk of melanoma but now it is believed that Parkinsons itself likely increases risk of melanoma. he is to get regular skin checks.  He is doing this.  He sees Dr. Adolphus Birchwood  2.  RLS/RBD  -Continue clonazepam 1 mg q hs.  Understands addictive postential.  PDMP reviewed and refills provided  -he has bed rails now and uses them  -melatonin 3 mg at bedtime.  3.  Sleep apnea  -On CPAP.  4.  Pulmonary hypertension  -Follows with cardiology and pulmonary  5  MCI  -pt with neurocog testing in Jan, 2022 and demonstrated only MCI.  No evidence of a mood disorder.  I do not suspect that this has gotten worse.  6.  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  -Patient off of amlodipine.  -Patient not taking losartan only if blood pressure systolically is greater than 140.  Only takes the Lasix as needed.  Is taking Imdur daily.  Cardiology and primary care are working together with me for allowing for permissive hypertension.  7.  Carotid bruit, left  -u/s on 10/11 was normal  8.  Weakness since Covid  -Suspect that this is just deconditioning.  He is back in therapy.  -Will proceed with MRI brain given that he is listing to the left.  Otherwise, his strength really is normal and equal  bilaterally.  COVID does cause hypercoagulability and I want to make sure that he did not have small stroke.  Subjective:   Johnathan Arnold. was seen today in follow up for Parkinsons disease.  My previous records were reviewed prior to todays visit as well as outside records available to me. pts wife and son with patient and supplements the hx. patient had COVID back in August and started Paxlovid, but unfortunately had significant symptoms from it, including weakness.  He had significant difficulty walking and had a fall after taking the Paxlovid.  He also had more bradycardia than baseline with paxlovid.  He did follow-up with his primary care physician not long thereafter and she made a referral for physical therapy.  He started to keep blood pressure logs for his primary care physician and he sent them to her in September and they started to trend upwards.  I reviewed those as well.  He is only taking his losartan as needed for systolic greater than 140 and only takes the Lasix as needed.  He feels that he hasn't done well since he had Covid and took the Paxlovid.  He feels that the Parkinsons Disease has been more significant since the covid/paxlovid and is having more tremor on both sides, which is more unusual for him.  He tried to increase his morning dose to 3 tablets and it didn't help so he went to  2 tablets.  He is in PT at [facility].  He has been in it x 6 weeks.  He has had 2-3 episodes of falling since covid.  Before covid, he was at [employer] in the parking lot and he had festination with slanted parking lot and he fell forward and he hit his pace.  He went to the ER and was evaluated.  He is doing RSB 3 days per week.  He is no longer having any dizziness.  Current prescribed movement disorder medications: Carbidopa/levodopa 25/100, 2/2/2/1 Carbidopa/levodopa 50/200 CR at bedtime  Clonazepam, 1 mg at bed  Prior meds:  entacapone (given but not taken)    ALLERGIES:   Allergies   Allergen Reactions   Paxlovid [Nirmatrelvir-Ritonavir]     CURRENT MEDICATIONS:  Outpatient Encounter Medications as of 03/24/2023  Medication Sig   acetaminophen (TYLENOL) 500 MG tablet Take 500 mg by mouth as needed.   aspirin EC 81 MG tablet Take 1 tablet (81 mg total) by mouth at bedtime.   carbidopa-levodopa (SINEMET CR) 50-200 MG tablet TAKE 1 TABLET BY MOUTH AT BEDTIME   carbidopa-levodopa (SINEMET IR) 25-100 MG tablet Take 2 at 8am, 2 at 11am, 2 at 2 pm, 1 at 5pm   clonazePAM (KLONOPIN) 1 MG tablet TAKE 1 TABLET BY MOUTH AT BEDTIME   diazepam (VALIUM) 5 MG tablet Take 5 mg by mouth 3 (three) times daily as needed.   furosemide (LASIX) 20 MG tablet Take 1 tablet (20 mg total) by mouth daily as needed (for swelling).   isosorbide mononitrate (IMDUR) 60 MG 24 hr tablet TAKE 1 TABLET BY MOUTH DAILY   losartan (COZAAR) 25 MG tablet Take 1 tablet (25 mg total) by mouth daily as needed (at night for systolic greater than 140).   melatonin 3 MG TABS tablet Take 1.5 mg by mouth at bedtime as needed.   Multiple Vitamin (MULTIVITAMIN) tablet Take 1 tablet by mouth daily.   nitroGLYCERIN (NITROSTAT) 0.4 MG SL tablet Place 1 tablet (0.4 mg total) under the tongue every 5 (five) minutes as needed for chest pain.   omeprazole (PRILOSEC) 20 MG capsule Take 1 capsule (20 mg total) by mouth every morning.   polyethylene glycol (MIRALAX / GLYCOLAX) packet Take 17 g by mouth daily as needed.   ranolazine (RANEXA) 1000 MG SR tablet TAKE 1 TABLET BY MOUTH 2 TIMES A DAY   rosuvastatin (CRESTOR) 10 MG tablet TAKE 1 TABLET BY MOUTH DAILY   tamsulosin (FLOMAX) 0.4 MG CAPS capsule TAKE ONE CAPSULE BY MOUTH DAILY   No facility-administered encounter medications on file as of 03/24/2023.    Objective:   PHYSICAL EXAMINATION:    VITALS:   Vitals:   03/24/23 1456  BP: (!) 122/58  Pulse: (!) 59  SpO2: 100%  Weight: 169 lb (76.7 kg)  Height: 5\' 7"  (1.702 m)    No data found.  GEN:  The patient  appears stated age and is in NAD. HEENT:  Normocephalic, atraumatic.  The mucous membranes are moist. The superficial temporal arteries are without ropiness or tenderness.  CV:  Huston Foley.  regular Lungs: CTAB Neck:  there is L carotid bruit  Neurological examination:  Orientation: The patient is alert and oriented x3. Cranial nerves: There is good facial symmetry with minimal facial hypomimia. The speech is fluent and clear. Soft palate rises symmetrically and there is no tongue deviation. Hearing is slightly decreased to conversational tone. Sensation: Sensation is intact to light touch throughout Motor: Strength is 5/5 in the  bilateral UE/LE  Movement examination: Tone: There is nl tone in the ue/le Abnormal movements: there is intermittent RUE rest tremor Coordination:  There is no decremation with RAM's, with any form of RAMS, including alternating supination and pronation of the forearm, hand opening and closing, finger taps, heel taps and toe taps. Gait and Station: The patient has no difficulty arising out of a deep-seated chair without the use of the hands. The patient's stride length is good.  He is leaned to the left and just slightly drags the L leg  Total time spent on today's visit was 44 minutes, including both face-to-face time and nonface-to-face time.  Time included that spent on review of records (prior notes available to me/labs/imaging if pertinent), discussing treatment and goals, answering patient's questions and coordinating care.    Cc:  [name], MD

## 2023-03-24 ENCOUNTER — Ambulatory Visit (INDEPENDENT_AMBULATORY_CARE_PROVIDER_SITE_OTHER): Payer: Medicare Other | Admitting: Neurology

## 2023-03-24 ENCOUNTER — Encounter: Payer: Self-pay | Admitting: Neurology

## 2023-03-24 VITALS — BP 122/58 | HR 59 | Ht 67.0 in | Wt 169.0 lb

## 2023-03-24 DIAGNOSIS — G20A1 Parkinson's disease without dyskinesia, without mention of fluctuations: Secondary | ICD-10-CM | POA: Diagnosis not present

## 2023-03-24 DIAGNOSIS — R531 Weakness: Secondary | ICD-10-CM

## 2023-03-24 DIAGNOSIS — G903 Multi-system degeneration of the autonomic nervous system: Secondary | ICD-10-CM | POA: Diagnosis not present

## 2023-03-24 DIAGNOSIS — R296 Repeated falls: Secondary | ICD-10-CM

## 2023-03-24 MED ORDER — CLONAZEPAM 1 MG PO TABS: 1.0000 mg | ORAL_TABLET | Freq: Every day | ORAL | 1 refills | Status: DC

## 2023-03-26 ENCOUNTER — Ambulatory Visit: Payer: Medicare Other | Admitting: Physical Therapy

## 2023-03-26 ENCOUNTER — Encounter: Payer: Self-pay | Admitting: Physical Therapy

## 2023-03-26 DIAGNOSIS — M6281 Muscle weakness (generalized): Secondary | ICD-10-CM | POA: Diagnosis not present

## 2023-03-26 DIAGNOSIS — R269 Unspecified abnormalities of gait and mobility: Secondary | ICD-10-CM

## 2023-03-26 DIAGNOSIS — R2681 Unsteadiness on feet: Secondary | ICD-10-CM | POA: Diagnosis not present

## 2023-03-26 DIAGNOSIS — R262 Difficulty in walking, not elsewhere classified: Secondary | ICD-10-CM

## 2023-03-26 DIAGNOSIS — R278 Other lack of coordination: Secondary | ICD-10-CM | POA: Diagnosis not present

## 2023-03-26 NOTE — Therapy (Signed)
OUTPATIENT PHYSICAL THERAPY NEURO TREATMENT / RECERT     Patient Name: Johnathan Arnold. MRN: [id number] DOB:1945/09/30, 77 y.o., male Today's Date: 03/26/2023   PCP: [name], MD   REFERRING PROVIDER: [name], MD   END OF SESSION:  PT End of Session - 03/26/23 1058     Visit Number 11    Number of Visits 16    Date for PT Re-Evaluation 05/07/23    Authorization Type Medicare    Progress Note Due on Visit 20    PT Start Time 0939    PT Stop Time 1015    PT Time Calculation (min) 36 min    Equipment Utilized During Treatment Gait belt    Activity Tolerance Patient tolerated treatment well    Behavior During Therapy [facility] for tasks assessed/performed                   Past Medical History:  Diagnosis Date   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 LIMA to LAD, SVG to D1, OM1 and RPDA   CORONARY ARTERY BYPASS GRAFT  03/1998   5 vessel, [facility]   RIGHT HEART CATH N/A 04/04/2019   Procedure: RIGHT HEART CATH;  Surgeon: Iran Ouch, MD;  Location: [facility] INVASIVE CV LAB;  Service: Cardiovascular;  Laterality: N/A;   RIGHT/LEFT HEART CATH AND CORONARY ANGIOGRAPHY N/A 02/01/2018   Procedure: RIGHT/LEFT HEART CATH AND CORONARY ANGIOGRAPHY;  Surgeon: Iran Ouch, MD;  Location: [facility] INVASIVE CV LAB;  Service: Cardiovascular;  Laterality: N/A;   Patient Active Problem List   Diagnosis Date Noted   Adverse drug reaction 01/20/2023   History of COVID-19 03/18/2022   History of diabetes mellitus, type II 03/18/2022   Hospital discharge follow-up 05/16/2021   Generalized  weakness 05/07/2021   Fall    Head injury    Bradycardia    Anemia, unspecified 02/07/2021   Neutropenia (HCC) 01/29/2021   Thrombocytopenia (HCC) 01/29/2021   Hyponatremia 12/22/2020   Bilateral leg weakness 12/20/2020   Prostate cancer screening 09/08/2020   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) 09/06/2020   Low back pain 08/19/2019   Pulmonary hypertension (HCC)    Insomnia 12/21/2018   Sleep apnea in adult 12/09/2018   Periodic limb movement disorder 12/09/2018   Pulmonary nodules 05/18/2018   Wears hearing aid in both ears 05/17/2018   Leg pain, bilateral 02/20/2018   Dyspnea    CKD (chronic kidney disease) stage 3, GFR 30-59 ml/min (HCC) 09/21/2017   History of skin cancer in adulthood 08/14/2016   Parkinson's disease (HCC) 08/09/2016   Bilateral carotid artery stenosis 04/02/2015   Vertigo, peripheral 10/17/2014   Benign prostatic hyperplasia with urinary frequency 01/31/2014   Encounter for Medicare annual wellness exam 07/02/2013   Obesity 04/03/2013   Other malaise and fatigue 09/21/2012   Hyperlipidemia    Hypertension    3-vessel coronary artery disease    S/P CABG x 5     ONSET DATE: 01/20/23  REFERRING DIAG: Y86.578 (ICD-10-CM) - Weakness of  both lower extremities   THERAPY DIAG:  Unsteadiness on feet  Abnormality of gait and mobility  Difficulty in walking, not elsewhere classified  Muscle weakness (generalized)  Rationale for Evaluation and Treatment: Rehabilitation  SUBJECTIVE:                                                                                                                                                                                             SUBJECTIVE STATEMENT:  Pt reports that he is doing well, is sorry he is running late for appointment. Intermittent back pain since last visit and intermittent compliance with HEP.   Pt accompanied by: self  PERTINENT HISTORY: PD, history of COVID and significant  weakness present following this diagnosis and the use of paxlovid ( August 5 dx date, thinks Paxlovid may have exacerbated PD symptoms)   Pt reports having COVID and having significant weakness.  Patient reports he also has back pain that is not present for a long time but was exacerbated more recently.  Patient previously did physical therapy for his back and experienced some relief but has not been consistent with the exercises.  Patient also has history of going to Parkinson's rock steady classes multiple times per week prior to onset of his COVID but he has not been back since. Patient previously ambulated without an assistive device but is now ambulating with a straight point cane.  Patient reports increased foot and ankle weakness in comparison with his hips and knees.  Patient also reports low back pain that is exacerbated with prolonged standing. PAIN:  Are you having pain? Yes: NPRS scale: 6-7/10 Pain location: lower back  Pain description: pin when standing upright for prolonged periods  Aggravating factors: standing prolonged periods  Relieving factors: sitting completely alleviates pain   PRECAUTIONS: Fall  RED FLAGS: None   WEIGHT BEARING RESTRICTIONS: No  FALLS: Has patient fallen in last 6 months? Yes. Number of falls 2-3  LIVING ENVIRONMENT: Lives with: lives with their family and lives with their spouse Lives in: House/apartment Stairs: Yes: Internal: 15 steps; on left going up and External: 1 steps; none Has following equipment at home: Quad cane large base  PLOF: Independent  PATIENT GOALS: To improve strength to prior to his COVID diagnosis   OBJECTIVE:   DIAGNOSTIC FINDINGS: n/a  COGNITION: Overall cognitive status: Within functional limits for tasks assessed   SENSATION: Not tested      POSTURE: rounded shoulders, forward head, and posterior pelvic tilt   LOWER EXTREMITY MMT:    MMT Right Eval Left Eval Right Eval  Left Eval   Hip flexion 4 4  4+ 4+  Hip extension      Hip abduction 4 4 5 5   Hip adduction 4 4 5 5   Hip internal rotation      Hip external rotation      Knee flexion 4+ 4+ 4+ 4+  Knee extension 4+ 4+ 4+ 4+  Ankle dorsiflexion 4 4 4+ 4+  Ankle plantarflexion      Ankle inversion 4 4 4+ 4+  Ankle eversion 4 4 4+ 4+  (Blank rows = not tested)  BED MOBILITY:  No limitations per pt   TRANSFERS: Assistive device utilized: None  Sit to stand: Complete Independence Stand to sit: Complete Independence Chair to chair: Complete Independence Floor:  Not tested     STAIRS: Level of Assistance: Complete Independence Stair Negotiation Technique: Alternating Pattern  with Bilateral Rails Number of Stairs: 4  Height of Stairs: 6in  Comments:   GAIT: Gait pattern: step through pattern Distance walked: 60 ft Assistive device utilized: None Level of assistance: Complete Independence Comments:   FUNCTIONAL TESTS:  Pt scores 21 / 28 on mini BEST balance test. Scores < 16 indicate increased risk for falls [place], [place], [place]) and MCID is 4 (Godi,et al, 2013)      10 Meter Walk Test: Patient instructed to walk 10 meters (32.8 ft) as quickly and as safely as possible at their normal speed x2 Time measured from 2 meter mark to 8 meter mark to accommodate ramp-up and ramp-down.  Normal speed 1: 1 m/s Normal speed 2: 1.01 m/s Average Normal speed: 1 m/s  Cut off scores: <0.4 m/s = household Ambulator, 0.4-0.8 m/s = limited community Ambulator, >0.8 m/s = community Ambulator, >1.2 m/s = crossing a street, <1.0 = increased fall risk MCID 0.05 m/s (small), 0.13 m/s (moderate) (ANPTA Core Set of Outcome Measures for Adults with Neurologic Conditions, 2018)  Five times Sit to Stand Test (FTSS)  TIME: 11.16 sec  Cut off scores indicative of increased fall risk: >12 sec CVA, >16 sec PD, >13 sec vestibular (ANPTA Core Set of Outcome Measures for Adults with Neurologic Conditions, 2018)   PATIENT SURVEYS:   FOTO: 53  TODAY'S TREATMENT:                                                                                                                               TE  octane level 3 x 6 min for aerobic priming and for UE and LE reciprocal large movement initiation    Standing good morning with focus on postural extension 2 x 10 reps   Seated PWR! Up with GTB for resistance with HABD 2 x 10   TRX row 2 x 10, about 45 degree in end point of row   Trx postural extension exercise ( step forward facing away from anchor point and allow arms to go into horizontal abduction)  2 x 10    Bent over HADD with 2.5# 2 x 10  Pt session was cut a little short today due to pt arriving late to scheduled appointment time    PATIENT EDUCATION: Education details: POC .  Pt educated throughout session about proper posture and technique with exercises. Improved exercise technique, movement at target joints, use of target muscles after min to mod verbal, visual, tactile cues.  Person educated: Patient Education method: Explanation Education comprehension: verbalized understanding  HOME EXERCISE PROGRAM: Access Code: [facility] URL: [email or web address] Date: 01/30/2023 Prepared by: Thresa Ross  Exercises - Shoulder extension with resistance - Neutral  - 1 x daily - 7 x weekly - 2 sets - 12 reps - Standing Shoulder Row with Anchored Resistance  - 1 x daily - 7 x weekly - 2 sets - 15 reps - Shoulder External Rotation and Scapular Retraction with Resistance  - 1 x daily - 7 x weekly - 2 sets - 10 reps - Seated Shoulder Horizontal Abduction with Resistance - Palms Down  - 1 x daily - 7 x weekly - 2 sets - 12 reps -Standing PWR! Moves x 10 ea (UP, ROCK, TWIST,STEP)  GOALS: Goals reviewed with patient? Yes  SHORT TERM GOALS: Target date: 04/09/2023    Patient will be independent in home exercise program to improve strength/mobility for better functional independence with  ADLs. Baseline: No HEP currently  10/2: Was doing them but has not been lately  Goal status: NOT MET    LONG TERM GOALS: Target date: 05/07/2023  1.  Patient will increase FOTO score to equal to or greater than  62   to demonstrate statistically significant improvement in mobility and quality of life.  Baseline: 53  10/2:52 Goal status: ONGOING   2.  Patient will increase Mini BEST Balance score by > 4  points to demonstrate decreased fall risk during functional activities. Baseline: 21 Goal status: ONGOING   3.   Patient will improve LE muscle strength to > 4+/5 in hip, knee and ankle strength in order to indicate return to prior level of LE strength  Baseline: see eval chart  Goal status: MET  5.   Patient will increase six minute walk test distance to >1100 with pain less than 5/10 throughout for progression to community ambulator and improve gait ability without discomfort Baseline: 890 ft in 5 minutes, througohut test but more profoundly at 4 minutes pt began to have stooped kyphotic posture. This worsened and was followed by back pain (6/10) that caused the test to be suspended at 5 minutes.  10/2: 990 ft with similar progression of flexed posture throughout. Pain 5/10 at 5:30  Goal status: INITIAL    ASSESSMENT:  CLINICAL IMPRESSION:  Pt presents for recert note this date. Last session spend completing progress note and goals updated at that time. See the below clinical impression from progress note for details copied from the last visit pt was here where progress note was completed and goals were assessed.   "Patient presents to physical therapy for progress note this date.  Patient shows progress with both balance and manual muscle testing goals showing improved safety with ambulation as well as improved lower extremity strength.  Patient makes some progress with 6-minute walk test but still has increase in back pain and kyphotic posture that worsens with prolonged  ambulation.  Patient instructed to be more diligent with his home exercise program to attenuate progress with this goal.  Patient's focus on therapeutic outcome survey goal did not show any improvements, however objective balance measures to show improvements in  patient's mobility.  Patient will continue to benefit from skilled physical therapy to improve his pain and posture with mobility, balance, his confidence with everyday activities.Patient's condition has the potential to improve in response to therapy. Maximum improvement is yet to be obtained. The anticipated improvement is attainable and reasonable in a generally predictable time."   Patient arrived with good motivation form completion of pt activities. Pt session was cut a little short today due to pt arriving late to scheduled appointment time. Pt continues with postural extension training both strength and endurance. Pt showing increased signs of fatigue this date compared to previously but no overt LOB noted with anything, however, balance was not focus of treatment and was thus not challenged as heavily compared to previous sessions. Pt will continue to benefit from skilled physical therapy intervention to address impairments, improve QOL, and attain therapy goals.         OBJECTIVE IMPAIRMENTS: decreased activity tolerance, decreased balance, decreased mobility, difficulty walking, and decreased strength.   ACTIVITY LIMITATIONS: standing, squatting, stairs, and locomotion level  PARTICIPATION LIMITATIONS: community activity  PERSONAL FACTORS: 3+ comorbidities: PD, HLD, HTN, Back pain  are also affecting patient's functional outcome.   REHAB POTENTIAL: Good  CLINICAL DECISION MAKING: Stable/uncomplicated  EVALUATION COMPLEXITY: Low  PLAN:  PT FREQUENCY: 2x/week  PT DURATION: 12 weeks  PLANNED INTERVENTIONS: Therapeutic exercises, Therapeutic activity, Neuromuscular re-education, Balance training, Gait training,  Patient/Family education, Self Care, Joint mobilization, Joint manipulation, Spinal manipulation, Spinal mobilization, Moist heat, and Manual therapy  PLAN FOR NEXT SESSION:   Continue Antigravity extension activities to improve posture with prolonged ambulation, PD specific exercises with balance focus and postural focus.   Norman Herrlich PT ,DPT Physical Therapist- [place]  [facility]  3:11 PM 03/26/23

## 2023-03-30 ENCOUNTER — Ambulatory Visit
Admission: RE | Admit: 2023-03-30 | Discharge: 2023-03-30 | Disposition: A | Discharge status: Home / Self Care | Payer: Medicare Other | Source: Ambulatory Visit | Attending: Neurology | Admitting: Neurology

## 2023-03-30 DIAGNOSIS — R9089 Other abnormal findings on diagnostic imaging of central nervous system: Secondary | ICD-10-CM | POA: Diagnosis not present

## 2023-03-30 DIAGNOSIS — R296 Repeated falls: Secondary | ICD-10-CM | POA: Diagnosis not present

## 2023-03-30 DIAGNOSIS — G20C Parkinsonism, unspecified: Secondary | ICD-10-CM | POA: Diagnosis not present

## 2023-03-30 DIAGNOSIS — R531 Weakness: Secondary | ICD-10-CM | POA: Diagnosis not present

## 2023-04-01 ENCOUNTER — Ambulatory Visit: Payer: Medicare Other | Admitting: Physical Therapy

## 2023-04-01 DIAGNOSIS — R278 Other lack of coordination: Secondary | ICD-10-CM | POA: Diagnosis not present

## 2023-04-01 DIAGNOSIS — R2681 Unsteadiness on feet: Secondary | ICD-10-CM | POA: Diagnosis not present

## 2023-04-01 DIAGNOSIS — M6281 Muscle weakness (generalized): Secondary | ICD-10-CM | POA: Diagnosis not present

## 2023-04-01 DIAGNOSIS — R262 Difficulty in walking, not elsewhere classified: Secondary | ICD-10-CM

## 2023-04-01 DIAGNOSIS — R269 Unspecified abnormalities of gait and mobility: Secondary | ICD-10-CM

## 2023-04-01 NOTE — Therapy (Signed)
OUTPATIENT PHYSICAL THERAPY NEURO TREATMENT    Patient Name: Johnathan Arnold. MRN: [id number] DOB:[birth year], 77 y.o., male Today's Date: 04/01/2023   PCP: [name], MD   REFERRING PROVIDER: [name], MD   END OF SESSION:  PT End of Session - 04/01/23 1021     Visit Number 12    Number of Visits 16    Date for PT Re-Evaluation 05/07/23    Authorization Type Medicare    Progress Note Due on Visit 20    PT Start Time 1016    PT Stop Time 1057    PT Time Calculation (min) 41 min    Equipment Utilized During Treatment Gait belt    Activity Tolerance Patient tolerated treatment well    Behavior During Therapy WFL for tasks assessed/performed                    Past Medical History:  Diagnosis Date   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 LIMA to LAD, SVG to D1, OM1 and RPDA   CORONARY ARTERY BYPASS GRAFT  03/1998   5 vessel, [facility]   RIGHT HEART CATH N/A 04/04/2019   Procedure: RIGHT HEART CATH;  Surgeon: Iran Ouch, MD;  Location: [facility] INVASIVE CV LAB;  Service: Cardiovascular;  Laterality: N/A;   RIGHT/LEFT HEART CATH AND CORONARY ANGIOGRAPHY N/A 02/01/2018   Procedure: RIGHT/LEFT HEART CATH AND CORONARY ANGIOGRAPHY;  Surgeon: Iran Ouch, MD;  Location: [facility] INVASIVE CV LAB;  Service: Cardiovascular;  Laterality: N/A;   Patient Active Problem List   Diagnosis Date Noted   Adverse drug reaction 01/20/2023   History of COVID-19 03/18/2022   History of diabetes mellitus, type II 03/18/2022   Hospital discharge follow-up 05/16/2021   Generalized  weakness 05/07/2021   Fall    Head injury    Bradycardia    Anemia, unspecified 02/07/2021   Neutropenia (HCC) 01/29/2021   Thrombocytopenia (HCC) 01/29/2021   Hyponatremia 12/22/2020   Bilateral leg weakness 12/20/2020   Prostate cancer screening 09/08/2020   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) 09/06/2020   Low back pain 08/19/2019   Pulmonary hypertension (HCC)    Insomnia 12/21/2018   Sleep apnea in adult 12/09/2018   Periodic limb movement disorder 12/09/2018   Pulmonary nodules 05/18/2018   Wears hearing aid in both ears 05/17/2018   Leg pain, bilateral 02/20/2018   Dyspnea    CKD (chronic kidney disease) stage 3, GFR 30-59 ml/min (HCC) 09/21/2017   History of skin cancer in adulthood 08/14/2016   Parkinson's disease (HCC) 08/09/2016   Bilateral carotid artery stenosis 04/02/2015   Vertigo, peripheral 10/17/2014   Benign prostatic hyperplasia with urinary frequency 01/31/2014   Encounter for Medicare annual wellness exam 07/02/2013   Obesity 04/03/2013   Other malaise and fatigue 09/21/2012   Hyperlipidemia    Hypertension    3-vessel coronary artery disease    S/P CABG x 5     ONSET DATE: 01/20/23  REFERRING DIAG: V78.469 (ICD-10-CM) - Weakness of both lower  extremities   THERAPY DIAG:  Unsteadiness on feet  Abnormality of gait and mobility  Difficulty in walking, not elsewhere classified  Muscle weakness (generalized)  Rationale for Evaluation and Treatment: Rehabilitation  SUBJECTIVE:                                                                                                                                                                                             SUBJECTIVE STATEMENT:  Pt reports that he is doing well, no back pain at this time. No other changes to note at this time.   Pt accompanied by: self  PERTINENT HISTORY: PD, history of COVID and significant weakness present following this diagnosis and the use of  paxlovid ( August 5 dx date, thinks Paxlovid may have exacerbated PD symptoms)   Pt reports having COVID and having significant weakness.  Patient reports he also has back pain that is not present for a long time but was exacerbated more recently.  Patient previously did physical therapy for his back and experienced some relief but has not been consistent with the exercises.  Patient also has history of going to Parkinson's rock steady classes multiple times per week prior to onset of his COVID but he has not been back since. Patient previously ambulated without an assistive device but is now ambulating with a straight point cane.  Patient reports increased foot and ankle weakness in comparison with his hips and knees.  Patient also reports low back pain that is exacerbated with prolonged standing. PAIN:  Are you having pain? Yes: NPRS scale: 6-7/10 Pain location: lower back  Pain description: pin when standing upright for prolonged periods  Aggravating factors: standing prolonged periods  Relieving factors: sitting completely alleviates pain   PRECAUTIONS: Fall  RED FLAGS: None   WEIGHT BEARING RESTRICTIONS: No  FALLS: Has patient fallen in last 6 months? Yes. Number of falls 2-3  LIVING ENVIRONMENT: Lives with: lives with their family and lives with their spouse Lives in: House/apartment Stairs: Yes: Internal: 15 steps; on left going up and External: 1 steps; none Has following equipment at home: Quad cane large base  PLOF: Independent  PATIENT GOALS: To improve strength to prior to his COVID diagnosis   OBJECTIVE:   DIAGNOSTIC FINDINGS: n/a  COGNITION: Overall cognitive status: Within functional limits for tasks assessed   SENSATION: Not tested      POSTURE: rounded shoulders, forward head, and posterior pelvic tilt   LOWER EXTREMITY MMT:    MMT Right Eval Left Eval Right Eval  Left Eval   Hip flexion 4 4 4+ 4+  Hip extension  Hip abduction 4 4 5 5   Hip  adduction 4 4 5 5   Hip internal rotation      Hip external rotation      Knee flexion 4+ 4+ 4+ 4+  Knee extension 4+ 4+ 4+ 4+  Ankle dorsiflexion 4 4 4+ 4+  Ankle plantarflexion      Ankle inversion 4 4 4+ 4+  Ankle eversion 4 4 4+ 4+  (Blank rows = not tested)  BED MOBILITY:  No limitations per pt   TRANSFERS: Assistive device utilized: None  Sit to stand: Complete Independence Stand to sit: Complete Independence Chair to chair: Complete Independence Floor:  Not tested     STAIRS: Level of Assistance: Complete Independence Stair Negotiation Technique: Alternating Pattern  with Bilateral Rails Number of Stairs: 4  Height of Stairs: 6in  Comments:   GAIT: Gait pattern: step through pattern Distance walked: 60 ft Assistive device utilized: None Level of assistance: Complete Independence Comments:   FUNCTIONAL TESTS:  Pt scores 21 / 28 on mini BEST balance test. Scores < 16 indicate increased risk for falls Para [place], [place], & [place]) and MCID is 4 (Godi,et al, 2013)      10 Meter Walk Test: Patient instructed to walk 10 meters (32.8 ft) as quickly and as safely as possible at their normal speed x2 Time measured from 2 meter mark to 8 meter mark to accommodate ramp-up and ramp-down.  Normal speed 1: 1 m/s Normal speed 2: 1.01 m/s Average Normal speed: 1 m/s  Cut off scores: <0.4 m/s = household Ambulator, 0.4-0.8 m/s = limited community Ambulator, >0.8 m/s = community Ambulator, >1.2 m/s = crossing a street, <1.0 = increased fall risk MCID 0.05 m/s (small), 0.13 m/s (moderate) (ANPTA Core Set of Outcome Measures for Adults with Neurologic Conditions, 2018)  Five times Sit to Stand Test (FTSS)  TIME: 11.16 sec  Cut off scores indicative of increased fall risk: >12 sec CVA, >16 sec PD, >13 sec vestibular (ANPTA Core Set of Outcome Measures for Adults with Neurologic Conditions, 2018)   PATIENT SURVEYS:  FOTO: 53  TODAY'S TREATMENT:                                                                                                                                TE  octane level 3 x 6 min for aerobic priming and for UE and LE reciprocal large movement initiation   Standing good morning with focus on postural extension 2 x 10 reps with 3# hand weights in ea UE   TRX row 2 x 10, about 45 degree in end point of row   Trx postural extension exercise ( step forward facing away from anchor point and allow arms to go into horizontal abduction)  2 x 10   NMR  Seated PWR! Up with GTB for resistance with HABD 2 x 10  Seated PWR! Twist  with 1.5# wrist weights x 10 ea  Seated  PWR! Rock with 1.5# wrist weights x 10 ea side    Standing PWR! Moves on airex balance beam x 10 ea of up, rock, twist, and step  - GTB resistance for HABD on PWR! Up     Pt session was cut a little short today due to pt arriving late to scheduled appointment time    PATIENT EDUCATION: Education details: POC .  Pt educated throughout session about proper posture and technique with exercises. Improved exercise technique, movement at target joints, use of target muscles after min to mod verbal, visual, tactile cues.  Person educated: Patient Education method: Explanation Education comprehension: verbalized understanding  HOME EXERCISE PROGRAM: Access Code: [facility] URL: [email or web address] Date: 01/30/2023 Prepared by: Thresa Ross  Exercises - Shoulder extension with resistance - Neutral  - 1 x daily - 7 x weekly - 2 sets - 12 reps - Standing Shoulder Row with Anchored Resistance  - 1 x daily - 7 x weekly - 2 sets - 15 reps - Shoulder External Rotation and Scapular Retraction with Resistance  - 1 x daily - 7 x weekly - 2 sets - 10 reps - Seated Shoulder Horizontal Abduction with Resistance - Palms Down  - 1 x daily - 7 x weekly - 2 sets - 12 reps -Standing PWR! Moves x 10 ea (UP, ROCK, TWIST,STEP)  GOALS: Goals reviewed with patient? Yes  SHORT  TERM GOALS: Target date: 04/09/2023    Patient will be independent in home exercise program to improve strength/mobility for better functional independence with ADLs. Baseline: No HEP currently  10/2: Was doing them but has not been lately  Goal status: NOT MET    LONG TERM GOALS: Target date: 05/07/2023  1.  Patient will increase FOTO score to equal to or greater than  62   to demonstrate statistically significant improvement in mobility and quality of life.  Baseline: 53  10/2:52 Goal status: ONGOING   2.  Patient will increase Mini BEST Balance score by > 4  points to demonstrate decreased fall risk during functional activities. Baseline: 21 Goal status: ONGOING   3.   Patient will improve LE muscle strength to > 4+/5 in hip, knee and ankle strength in order to indicate return to prior level of LE strength  Baseline: see eval chart  Goal status: MET  5.   Patient will increase six minute walk test distance to >1100 with pain less than 5/10 throughout for progression to community ambulator and improve gait ability without discomfort Baseline: 890 ft in 5 minutes, througohut test but more profoundly at 4 minutes pt began to have stooped kyphotic posture. This worsened and was followed by back pain (6/10) that caused the test to be suspended at 5 minutes.  10/2: 990 ft with similar progression of flexed posture throughout. Pain 5/10 at 5:30  Goal status: INITIAL    ASSESSMENT:  CLINICAL IMPRESSION:   Patient presents to physical therapy with excellent motivation for completion of physical therapy activities.  Patient continues with postural posterior chain extension exercises as well as exercises to improve his balance and improve his symptoms related to Parkinson's disease.  Patient tolerates all interventions well without reports of increased back pain.  Patient will continue to benefit from skilled physical therapy to improve his posture, ambulatory endurance without back  pain, and overall quality of life.   OBJECTIVE IMPAIRMENTS: decreased activity tolerance, decreased balance, decreased mobility, difficulty walking, and decreased strength.   ACTIVITY LIMITATIONS: standing, squatting, stairs, and locomotion level  PARTICIPATION LIMITATIONS: community activity  PERSONAL FACTORS: 3+ comorbidities: PD, HLD, HTN, Back pain  are also affecting patient's functional outcome.   REHAB POTENTIAL: Good  CLINICAL DECISION MAKING: Stable/uncomplicated  EVALUATION COMPLEXITY: Low  PLAN:  PT FREQUENCY: 2x/week  PT DURATION: 12 weeks  PLANNED INTERVENTIONS: Therapeutic exercises, Therapeutic activity, Neuromuscular re-education, Balance training, Gait training, Patient/Family education, Self Care, Joint mobilization, Joint manipulation, Spinal manipulation, Spinal mobilization, Moist heat, and Manual therapy  PLAN FOR NEXT SESSION:   Continue Antigravity extension activities to improve posture with prolonged ambulation, PD specific exercises with balance focus and postural focus.   Norman Herrlich PT ,DPT Physical Therapist- [facility]  10:25 AM 04/01/23

## 2023-04-03 ENCOUNTER — Ambulatory Visit: Payer: Medicare Other | Admitting: Physical Therapy

## 2023-04-03 ENCOUNTER — Encounter: Payer: Self-pay | Admitting: Physical Therapy

## 2023-04-03 DIAGNOSIS — R262 Difficulty in walking, not elsewhere classified: Secondary | ICD-10-CM

## 2023-04-03 DIAGNOSIS — R278 Other lack of coordination: Secondary | ICD-10-CM | POA: Diagnosis not present

## 2023-04-03 DIAGNOSIS — R2681 Unsteadiness on feet: Secondary | ICD-10-CM | POA: Diagnosis not present

## 2023-04-03 DIAGNOSIS — M6281 Muscle weakness (generalized): Secondary | ICD-10-CM

## 2023-04-03 DIAGNOSIS — R269 Unspecified abnormalities of gait and mobility: Secondary | ICD-10-CM

## 2023-04-03 NOTE — Therapy (Signed)
OUTPATIENT PHYSICAL THERAPY NEURO TREATMENT    Patient Name: Johnathan Arnold. MRN: [id number] DOB:November 21, 1945, 77 y.o., male Today's Date: 04/03/2023   PCP: [name], MD   REFERRING PROVIDER: [name], MD   END OF SESSION:  PT End of Session - 04/03/23 1013     Visit Number 13    Number of Visits 16    Date for PT Re-Evaluation 05/07/23    Authorization Type Medicare    Progress Note Due on Visit 20    PT Start Time 1015    PT Stop Time 1058    PT Time Calculation (min) 43 min    Equipment Utilized During Treatment Gait belt    Activity Tolerance Patient tolerated treatment well    Behavior During Therapy WFL for tasks assessed/performed                    Past Medical History:  Diagnosis Date   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 LIMA to LAD, SVG to D1, OM1 and RPDA   CORONARY ARTERY BYPASS GRAFT  03/1998   5 vessel, [facility]   RIGHT HEART CATH N/A 04/04/2019   Procedure: RIGHT HEART CATH;  Surgeon: Iran Ouch, MD;  Location: [facility] INVASIVE CV LAB;  Service: Cardiovascular;  Laterality: N/A;   RIGHT/LEFT HEART CATH AND CORONARY ANGIOGRAPHY N/A 02/01/2018   Procedure: RIGHT/LEFT HEART CATH AND CORONARY ANGIOGRAPHY;  Surgeon: Iran Ouch, MD;  Location: [facility] INVASIVE CV LAB;  Service: Cardiovascular;  Laterality: N/A;   Patient Active Problem List   Diagnosis Date Noted   Adverse drug reaction 01/20/2023   History of COVID-19 03/18/2022   History of diabetes mellitus, type II 03/18/2022   Hospital discharge follow-up 05/16/2021   Generalized  weakness 05/07/2021   Fall    Head injury    Bradycardia    Anemia, unspecified 02/07/2021   Neutropenia (HCC) 01/29/2021   Thrombocytopenia (HCC) 01/29/2021   Hyponatremia 12/22/2020   Bilateral leg weakness 12/20/2020   Prostate cancer screening 09/08/2020   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) 09/06/2020   Low back pain 08/19/2019   Pulmonary hypertension (HCC)    Insomnia 12/21/2018   Sleep apnea in adult 12/09/2018   Periodic limb movement disorder 12/09/2018   Pulmonary nodules 05/18/2018   Wears hearing aid in both ears 05/17/2018   Leg pain, bilateral 02/20/2018   Dyspnea    CKD (chronic kidney disease) stage 3, GFR 30-59 ml/min (HCC) 09/21/2017   History of skin cancer in adulthood 08/14/2016   Parkinson's disease (HCC) 08/09/2016   Bilateral carotid artery stenosis 04/02/2015   Vertigo, peripheral 10/17/2014   Benign prostatic hyperplasia with urinary frequency 01/31/2014   Encounter for Medicare annual wellness exam 07/02/2013   Obesity 04/03/2013   Other malaise and fatigue 09/21/2012   Hyperlipidemia    Hypertension    3-vessel coronary artery disease    S/P CABG x 5     ONSET DATE: 01/20/23  REFERRING DIAG: V42.595 (ICD-10-CM) - Weakness of both lower  extremities   THERAPY DIAG:  Unsteadiness on feet  Abnormality of gait and mobility  Difficulty in walking, not elsewhere classified  Muscle weakness (generalized)  Other lack of coordination  Rationale for Evaluation and Treatment: Rehabilitation  SUBJECTIVE:                                                                                                                                                                                             SUBJECTIVE STATEMENT:  Pt reported having back spasms yesterday, but back is not bothering him as much today. Pt reported 5/10 on NPS referring to back spasms yesterday that lasted 2-3 seconds apart.   Pt accompanied by:  self  PERTINENT HISTORY: PD, history of COVID and significant weakness present following this diagnosis and the use of paxlovid ( August 5 dx date, thinks Paxlovid may have exacerbated PD symptoms)   Pt reports having COVID and having significant weakness.  Patient reports he also has back pain that is not present for a long time but was exacerbated more recently.  Patient previously did physical therapy for his back and experienced some relief but has not been consistent with the exercises.  Patient also has history of going to Parkinson's rock steady classes multiple times per week prior to onset of his COVID but he has not been back since. Patient previously ambulated without an assistive device but is now ambulating with a straight point cane.  Patient reports increased foot and ankle weakness in comparison with his hips and knees.  Patient also reports low back pain that is exacerbated with prolonged standing. PAIN:  Are you having pain? Yes: NPRS scale: 6-7/10 Pain location: lower back  Pain description: pin when standing upright for prolonged periods  Aggravating factors: standing prolonged periods  Relieving factors: sitting completely alleviates pain   PRECAUTIONS: Fall  RED FLAGS: None   WEIGHT BEARING RESTRICTIONS: No  FALLS: Has patient fallen in last 6 months? Yes. Number of falls 2-3  LIVING ENVIRONMENT: Lives with: lives with their family and lives with their spouse Lives in: House/apartment Stairs: Yes: Internal: 15 steps; on left going up and External: 1 steps; none Has following equipment at home: Quad cane large base  PLOF: Independent  PATIENT GOALS: To improve strength to prior to his COVID diagnosis   OBJECTIVE:   DIAGNOSTIC FINDINGS: n/a  COGNITION: Overall cognitive status: Within functional limits for tasks assessed   SENSATION: Not tested      POSTURE: rounded shoulders, forward head, and posterior pelvic tilt   LOWER EXTREMITY MMT:    MMT  Right Eval Left Eval Right Eval  Left  Eval   Hip flexion 4 4 4+ 4+  Hip extension      Hip abduction 4 4 5 5   Hip adduction 4 4 5 5   Hip internal rotation      Hip external rotation      Knee flexion 4+ 4+ 4+ 4+  Knee extension 4+ 4+ 4+ 4+  Ankle dorsiflexion 4 4 4+ 4+  Ankle plantarflexion      Ankle inversion 4 4 4+ 4+  Ankle eversion 4 4 4+ 4+  (Blank rows = not tested)  BED MOBILITY:  No limitations per pt   TRANSFERS: Assistive device utilized: None  Sit to stand: Complete Independence Stand to sit: Complete Independence Chair to chair: Complete Independence Floor:  Not tested     STAIRS: Level of Assistance: Complete Independence Stair Negotiation Technique: Alternating Pattern  with Bilateral Rails Number of Stairs: 4  Height of Stairs: 6in  Comments:   GAIT: Gait pattern: step through pattern Distance walked: 60 ft Assistive device utilized: None Level of assistance: Complete Independence Comments:   FUNCTIONAL TESTS:  Pt scores 21 / 28 on mini BEST balance test. Scores < 16 indicate increased risk for falls Para March, [place], & [place]) and MCID is 4 (Godi,et al, 2013)      10 Meter Walk Test: Patient instructed to walk 10 meters (32.8 ft) as quickly and as safely as possible at their normal speed x2 Time measured from 2 meter mark to 8 meter mark to accommodate ramp-up and ramp-down.  Normal speed 1: 1 m/s Normal speed 2: 1.01 m/s Average Normal speed: 1 m/s  Cut off scores: <0.4 m/s = household Ambulator, 0.4-0.8 m/s = limited community Ambulator, >0.8 m/s = community Ambulator, >1.2 m/s = crossing a street, <1.0 = increased fall risk MCID 0.05 m/s (small), 0.13 m/s (moderate) (ANPTA Core Set of Outcome Measures for Adults with Neurologic Conditions, 2018)  Five times Sit to Stand Test (FTSS)  TIME: 11.16 sec  Cut off scores indicative of increased fall risk: >12 sec CVA, >16 sec PD, >13 sec vestibular (ANPTA Core Set of Outcome Measures for  Adults with Neurologic Conditions, 2018)   PATIENT SURVEYS:  FOTO: 53  TODAY'S TREATMENT:                                                                                                                               TE  octane level 3 x 6 min for aerobic priming and for UE and LE reciprocal large movement initiation   TRX row 2 x 10, about 45 degree in end point of row    NMR   Seated PWR! Twist x 10 ea side Seated PWR! Rock x 10 ea side  Seated PWR! Step x 10 ea side  Supine PWR! Up x 10 reps  Supine PWR! Rock x 10 ea side Supine PWR! Twist x 10 ea side Supine PWR! Step x 10 ea side Prone PWR! Twist x  10 ea side   Standing PWR! Moves on airex balance beam 1x10 ea of rock, twist, and step, 2x10 reps of PWR! Up   - GTB resistance for HABD on PWR! Up   PWR! Up targets postural strengthening and antigravity extension, PRW! Rock targets functional weight shifting, PRW! Twist targets trunk rotation and PRW! Step targets transition movements. PWR! Moves target bradykinesia, rigidity, and dyskinesia through targeted functional movements that address four core movement difficulties for people with Parkinson's disease.  PATIENT EDUCATION: Education details: POC .  Pt educated throughout session about proper posture and technique with exercises. Improved exercise technique, movement at target joints, use of target muscles after min to mod verbal, visual, tactile cues.  Person educated: Patient Education method: Explanation Education comprehension: verbalized understanding  HOME EXERCISE PROGRAM: Access Code: [facility] URL: [email or web address] Date: 01/30/2023 Prepared by: Thresa Ross  Exercises - Shoulder extension with resistance - Neutral  - 1 x daily - 7 x weekly - 2 sets - 12 reps - Standing Shoulder Row with Anchored Resistance  - 1 x daily - 7 x weekly - 2 sets - 15 reps - Shoulder External Rotation and Scapular Retraction with Resistance  - 1 x daily -  7 x weekly - 2 sets - 10 reps - Seated Shoulder Horizontal Abduction with Resistance - Palms Down  - 1 x daily - 7 x weekly - 2 sets - 12 reps -Standing PWR! Moves x 10 ea (UP, ROCK, TWIST,STEP)  GOALS: Goals reviewed with patient? Yes  SHORT TERM GOALS: Target date: 04/09/2023    Patient will be independent in home exercise program to improve strength/mobility for better functional independence with ADLs. Baseline: No HEP currently  10/2: Was doing them but has not been lately  Goal status: NOT MET    LONG TERM GOALS: Target date: 05/07/2023  1.  Patient will increase FOTO score to equal to or greater than  62   to demonstrate statistically significant improvement in mobility and quality of life.  Baseline: 53  10/2:52 Goal status: ONGOING   2.  Patient will increase Mini BEST Balance score by > 4  points to demonstrate decreased fall risk during functional activities. Baseline: 21 Goal status: ONGOING   3.   Patient will improve LE muscle strength to > 4+/5 in hip, knee and ankle strength in order to indicate return to prior level of LE strength  Baseline: see eval chart  Goal status: MET  5.   Patient will increase six minute walk test distance to >1100 with pain less than 5/10 throughout for progression to community ambulator and improve gait ability without discomfort Baseline: 890 ft in 5 minutes, througohut test but more profoundly at 4 minutes pt began to have stooped kyphotic posture. This worsened and was followed by back pain (6/10) that caused the test to be suspended at 5 minutes.  10/2: 990 ft with similar progression of flexed posture throughout. Pain 5/10 at 5:30  Goal status: INITIAL    ASSESSMENT:  CLINICAL IMPRESSION: Patient presents to physical therapy with good motivation for completion of physical therapy activities. Although patient reported experiencing back spasms yesterday, it did not affect his participation in today's session. Patient continues  to participate in activities outside of physical therapy that improve his overall strength and balance as a result of his Parkinson's Disease diagnosis. Patient responded well to all interventions without reports of pain. Patient will continue to benefit from skilled physical therapy to improve his posture, ambulatory endurance without back  pain, and overall quality of life.    OBJECTIVE IMPAIRMENTS: decreased activity tolerance, decreased balance, decreased mobility, difficulty walking, and decreased strength.   ACTIVITY LIMITATIONS: standing, squatting, stairs, and locomotion level  PARTICIPATION LIMITATIONS: community activity  PERSONAL FACTORS: 3+ comorbidities: PD, HLD, HTN, Back pain  are also affecting patient's functional outcome.   REHAB POTENTIAL: Good  CLINICAL DECISION MAKING: Stable/uncomplicated  EVALUATION COMPLEXITY: Low  PLAN:  PT FREQUENCY: 2x/week  PT DURATION: 12 weeks  PLANNED INTERVENTIONS: Therapeutic exercises, Therapeutic activity, Neuromuscular re-education, Balance training, Gait training, Patient/Family education, Self Care, Joint mobilization, Joint manipulation, Spinal manipulation, Spinal mobilization, Moist heat, and Manual therapy  PLAN FOR NEXT SESSION:   Continue Antigravity extension activities to improve posture with prolonged ambulation, PD specific exercises with balance focus and postural focus.   Norman Herrlich PT ,DPT Physical Therapist- [facility]  10:19 AM 04/03/23

## 2023-04-04 ENCOUNTER — Other Ambulatory Visit: Payer: Self-pay | Admitting: Cardiovascular Disease

## 2023-04-06 ENCOUNTER — Ambulatory Visit: Payer: Medicare Other | Admitting: Physical Therapy

## 2023-04-06 DIAGNOSIS — R2681 Unsteadiness on feet: Secondary | ICD-10-CM

## 2023-04-06 DIAGNOSIS — R262 Difficulty in walking, not elsewhere classified: Secondary | ICD-10-CM

## 2023-04-06 DIAGNOSIS — M6281 Muscle weakness (generalized): Secondary | ICD-10-CM

## 2023-04-06 DIAGNOSIS — R278 Other lack of coordination: Secondary | ICD-10-CM | POA: Diagnosis not present

## 2023-04-06 DIAGNOSIS — R269 Unspecified abnormalities of gait and mobility: Secondary | ICD-10-CM

## 2023-04-06 NOTE — Therapy (Signed)
OUTPATIENT PHYSICAL THERAPY NEURO TREATMENT    Patient Name: Johnathan Arnold. MRN: [id number] DOB:April 20, 1946, 77 y.o., male Today's Date: 04/06/2023   PCP: [name], MD   REFERRING PROVIDER: [name], MD   END OF SESSION:  PT End of Session - 04/06/23 1107     Visit Number 14    Number of Visits 16    Date for PT Re-Evaluation 05/07/23    Authorization Type Medicare    Progress Note Due on Visit 20    PT Start Time 1102    PT Stop Time 1142    PT Time Calculation (min) 40 min    Equipment Utilized During Treatment Gait belt    Activity Tolerance Patient tolerated treatment well    Behavior During Therapy WFL for tasks assessed/performed                    Past Medical History:  Diagnosis Date   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 LIMA to LAD, SVG to D1, OM1 and RPDA   CORONARY ARTERY BYPASS GRAFT  03/1998   5 vessel, [facility]   RIGHT HEART CATH N/A 04/04/2019   Procedure: RIGHT HEART CATH;  Surgeon: Iran Ouch, MD;  Location: [facility] INVASIVE CV LAB;  Service: Cardiovascular;  Laterality: N/A;   RIGHT/LEFT HEART CATH AND CORONARY ANGIOGRAPHY N/A 02/01/2018   Procedure: RIGHT/LEFT HEART CATH AND CORONARY ANGIOGRAPHY;  Surgeon: Iran Ouch, MD;  Location: [facility] INVASIVE CV LAB;  Service: Cardiovascular;  Laterality: N/A;   Patient Active Problem List   Diagnosis Date Noted   Adverse drug reaction 01/20/2023   History of COVID-19 03/18/2022   History of diabetes mellitus, type II 03/18/2022   Hospital discharge follow-up 05/16/2021   Generalized  weakness 05/07/2021   Fall    Head injury    Bradycardia    Anemia, unspecified 02/07/2021   Neutropenia (HCC) 01/29/2021   Thrombocytopenia (HCC) 01/29/2021   Hyponatremia 12/22/2020   Bilateral leg weakness 12/20/2020   Prostate cancer screening 09/08/2020   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) 09/06/2020   Low back pain 08/19/2019   Pulmonary hypertension (HCC)    Insomnia 12/21/2018   Sleep apnea in adult 12/09/2018   Periodic limb movement disorder 12/09/2018   Pulmonary nodules 05/18/2018   Wears hearing aid in both ears 05/17/2018   Leg pain, bilateral 02/20/2018   Dyspnea    CKD (chronic kidney disease) stage 3, GFR 30-59 ml/min (HCC) 09/21/2017   History of skin cancer in adulthood 08/14/2016   Parkinson's disease (HCC) 08/09/2016   Bilateral carotid artery stenosis 04/02/2015   Vertigo, peripheral 10/17/2014   Benign prostatic hyperplasia with urinary frequency 01/31/2014   Encounter for Medicare annual wellness exam 07/02/2013   Obesity 04/03/2013   Other malaise and fatigue 09/21/2012   Hyperlipidemia    Hypertension    3-vessel coronary artery disease    S/P CABG x 5     ONSET DATE: 01/20/23  REFERRING DIAG: H47.425 (ICD-10-CM) - Weakness of both lower  extremities   THERAPY DIAG:  Unsteadiness on feet  Abnormality of gait and mobility  Difficulty in walking, not elsewhere classified  Muscle weakness (generalized)  Rationale for Evaluation and Treatment: Rehabilitation  SUBJECTIVE:                                                                                                                                                                                             SUBJECTIVE STATEMENT:  Pt reported having back spasms yesterday, but back is not bothering him as much today. Pt reported 5/10 on NPS referring to back spasms yesterday that lasted 2-3 seconds apart.   Pt accompanied by: self  PERTINENT HISTORY: PD, history of  COVID and significant weakness present following this diagnosis and the use of paxlovid ( August 5 dx date, thinks Paxlovid may have exacerbated PD symptoms)   Pt reports having COVID and having significant weakness.  Patient reports he also has back pain that is not present for a long time but was exacerbated more recently.  Patient previously did physical therapy for his back and experienced some relief but has not been consistent with the exercises.  Patient also has history of going to Parkinson's rock steady classes multiple times per week prior to onset of his COVID but he has not been back since. Patient previously ambulated without an assistive device but is now ambulating with a straight point cane.  Patient reports increased foot and ankle weakness in comparison with his hips and knees.  Patient also reports low back pain that is exacerbated with prolonged standing. PAIN:  Are you having pain? Yes: NPRS scale: 6-7/10 Pain location: lower back  Pain description: pin when standing upright for prolonged periods  Aggravating factors: standing prolonged periods  Relieving factors: sitting completely alleviates pain   PRECAUTIONS: Fall  RED FLAGS: None   WEIGHT BEARING RESTRICTIONS: No  FALLS: Has patient fallen in last 6 months? Yes. Number of falls 2-3  LIVING ENVIRONMENT: Lives with: lives with their family and lives with their spouse Lives in: House/apartment Stairs: Yes: Internal: 15 steps; on left going up and External: 1 steps; none Has following equipment at home: Quad cane large base  PLOF: Independent  PATIENT GOALS: To improve strength to prior to his COVID diagnosis   OBJECTIVE:   DIAGNOSTIC FINDINGS: n/a  COGNITION: Overall cognitive status: Within functional limits for tasks assessed   SENSATION: Not tested      POSTURE: rounded shoulders, forward head, and posterior pelvic tilt   LOWER EXTREMITY MMT:    MMT Right Eval Left Eval Right Eval  Left  Eval   Hip flexion  4 4 4+ 4+  Hip extension      Hip abduction 4 4 5 5   Hip adduction 4 4 5 5   Hip internal rotation      Hip external rotation      Knee flexion 4+ 4+ 4+ 4+  Knee extension 4+ 4+ 4+ 4+  Ankle dorsiflexion 4 4 4+ 4+  Ankle plantarflexion      Ankle inversion 4 4 4+ 4+  Ankle eversion 4 4 4+ 4+  (Blank rows = not tested)  BED MOBILITY:  No limitations per pt   TRANSFERS: Assistive device utilized: None  Sit to stand: Complete Independence Stand to sit: Complete Independence Chair to chair: Complete Independence Floor:  Not tested     STAIRS: Level of Assistance: Complete Independence Stair Negotiation Technique: Alternating Pattern  with Bilateral Rails Number of Stairs: 4  Height of Stairs: 6in  Comments:   GAIT: Gait pattern: step through pattern Distance walked: 60 ft Assistive device utilized: None Level of assistance: Complete Independence Comments:   FUNCTIONAL TESTS:  Pt scores 21 / 28 on mini BEST balance test. Scores < 16 indicate increased risk for falls [employer] [place] [place]) and MCID is 4 (Godi,et al, 2013)      10 Meter Walk Test: Patient instructed to walk 10 meters (32.8 ft) as quickly and as safely as possible at their normal speed x2 Time measured from 2 meter mark to 8 meter mark to accommodate ramp-up and ramp-down.  Normal speed 1: 1 m/s Normal speed 2: 1.01 m/s Average Normal speed: 1 m/s  Cut off scores: <0.4 m/s = household Ambulator, 0.4-0.8 m/s = limited community Ambulator, >0.8 m/s = community Ambulator, >1.2 m/s = crossing a street, <1.0 = increased fall risk MCID 0.05 m/s (small), 0.13 m/s (moderate) (ANPTA Core Set of Outcome Measures for Adults with Neurologic Conditions, 2018)  Five times Sit to Stand Test (FTSS)  TIME: 11.16 sec  Cut off scores indicative of increased fall risk: >12 sec CVA, >16 sec PD, >13 sec vestibular (ANPTA Core Set of Outcome Measures for Adults with Neurologic Conditions,  2018)   PATIENT SURVEYS:  FOTO: 53  TODAY'S TREATMENT:                                                                                                                               TE  Octane level 3 x 6 min for aerobic priming and for UE and LE reciprocal large movement initiation   Seated horizontal abduction with GTB 2 x 10 reps   LTR 10 x ea side for 5 sec holds   Manual  2 x 45 sec piriformis stretch ea LE  X 4 min ea side, Stm and ischemic TP release to paraspinals and to gluteal origin  - L > R tender to palpation   NMR   Prone PWR! Twist x 10 ea side   Seated PWR! Twist and step x 10 ea  to ea side  Standing PWR! Step on airex beam, multiple LOB when stepping to the right x 10 ea side       PWR! Up targets postural strengthening and antigravity extension, PRW! Rock targets functional weight shifting, PRW! Twist targets trunk rotation and PRW! Step targets transition movements. PWR! Moves target bradykinesia, rigidity, and dyskinesia through targeted functional movements that address four core movement difficulties for people with Parkinson's disease.  PATIENT EDUCATION: Education details: POC .  Pt educated throughout session about proper posture and technique with exercises. Improved exercise technique, movement at target joints, use of target muscles after min to mod verbal, visual, tactile cues.  Person educated: Patient Education method: Explanation Education comprehension: verbalized understanding  HOME EXERCISE PROGRAM: Access Code: [facility] URL: [email or web address] Date: 01/30/2023 Prepared by: Thresa Ross  Exercises - Shoulder extension with resistance - Neutral  - 1 x daily - 7 x weekly - 2 sets - 12 reps - Standing Shoulder Row with Anchored Resistance  - 1 x daily - 7 x weekly - 2 sets - 15 reps - Shoulder External Rotation and Scapular Retraction with Resistance  - 1 x daily - 7 x weekly - 2 sets - 10 reps - Seated Shoulder  Horizontal Abduction with Resistance - Palms Down  - 1 x daily - 7 x weekly - 2 sets - 12 reps -Standing PWR! Moves x 10 ea (UP, ROCK, TWIST,STEP)  GOALS: Goals reviewed with patient? Yes  SHORT TERM GOALS: Target date: 04/09/2023    Patient will be independent in home exercise program to improve strength/mobility for better functional independence with ADLs. Baseline: No HEP currently  10/2: Was doing them but has not been lately  Goal status: NOT MET    LONG TERM GOALS: Target date: 05/07/2023  1.  Patient will increase FOTO score to equal to or greater than  62   to demonstrate statistically significant improvement in mobility and quality of life.  Baseline: 53  10/2:52 Goal status: ONGOING   2.  Patient will increase Mini BEST Balance score by > 4  points to demonstrate decreased fall risk during functional activities. Baseline: 21 Goal status: ONGOING   3.   Patient will improve LE muscle strength to > 4+/5 in hip, knee and ankle strength in order to indicate return to prior level of LE strength  Baseline: see eval chart  Goal status: MET  5.   Patient will increase six minute walk test distance to >1100 with pain less than 5/10 throughout for progression to community ambulator and improve gait ability without discomfort Baseline: 890 ft in 5 minutes, througohut test but more profoundly at 4 minutes pt began to have stooped kyphotic posture. This worsened and was followed by back pain (6/10) that caused the test to be suspended at 5 minutes.  10/2: 990 ft with similar progression of flexed posture throughout. Pain 5/10 at 5:30  Goal status: INITIAL    ASSESSMENT:  CLINICAL IMPRESSION: Patient presents to physical therapy with good motivation for completion of physical therapy activities. Although patient reported experiencing back spasms yesterday, it did not affect his participation in today's session. Patient continues to participate in activities outside of physical  therapy that improve his overall strength and balance as a result of his Parkinson's Disease diagnosis. Patient responded well to all interventions without reports of pain. Patient will continue to benefit from skilled physical therapy to improve his posture, ambulatory endurance without back pain, and overall quality of life.  OBJECTIVE IMPAIRMENTS: decreased activity tolerance, decreased balance, decreased mobility, difficulty walking, and decreased strength.   ACTIVITY LIMITATIONS: standing, squatting, stairs, and locomotion level  PARTICIPATION LIMITATIONS: community activity  PERSONAL FACTORS: 3+ comorbidities: PD, HLD, HTN, Back pain  are also affecting patient's functional outcome.   REHAB POTENTIAL: Good  CLINICAL DECISION MAKING: Stable/uncomplicated  EVALUATION COMPLEXITY: Low  PLAN:  PT FREQUENCY: 2x/week  PT DURATION: 12 weeks  PLANNED INTERVENTIONS: Therapeutic exercises, Therapeutic activity, Neuromuscular re-education, Balance training, Gait training, Patient/Family education, Self Care, Joint mobilization, Joint manipulation, Spinal manipulation, Spinal mobilization, Moist heat, and Manual therapy  PLAN FOR NEXT SESSION:   Continue Antigravity extension activities to improve posture with prolonged ambulation, PD specific exercises with balance focus and postural focus.   Norman Herrlich PT ,DPT Physical Therapist- [facility]  11:08 AM 04/06/23

## 2023-04-08 ENCOUNTER — Ambulatory Visit: Payer: Medicare Other | Admitting: Physical Therapy

## 2023-04-08 DIAGNOSIS — R278 Other lack of coordination: Secondary | ICD-10-CM | POA: Diagnosis not present

## 2023-04-08 DIAGNOSIS — R2681 Unsteadiness on feet: Secondary | ICD-10-CM | POA: Diagnosis not present

## 2023-04-08 DIAGNOSIS — R262 Difficulty in walking, not elsewhere classified: Secondary | ICD-10-CM | POA: Diagnosis not present

## 2023-04-08 DIAGNOSIS — M6281 Muscle weakness (generalized): Secondary | ICD-10-CM | POA: Diagnosis not present

## 2023-04-08 DIAGNOSIS — R269 Unspecified abnormalities of gait and mobility: Secondary | ICD-10-CM | POA: Diagnosis not present

## 2023-04-08 NOTE — Therapy (Signed)
OUTPATIENT PHYSICAL THERAPY NEURO TREATMENT    Patient Name: Johnathan Arnold. MRN: [id number] DOB:[birth year], 77 y.o., male Today's Date: 04/08/2023   PCP: [name], MD   REFERRING PROVIDER: [name], MD   END OF SESSION:  PT End of Session - 04/08/23 1108     Visit Number 15    Number of Visits 16    Date for PT Re-Evaluation 05/07/23    Authorization Type Medicare    Progress Note Due on Visit 20    PT Start Time 1101    PT Stop Time 1143    PT Time Calculation (min) 42 min    Equipment Utilized During Treatment Gait belt    Activity Tolerance Patient tolerated treatment well    Behavior During Therapy WFL for tasks assessed/performed                     Past Medical History:  Diagnosis Date   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 LIMA to LAD, SVG to D1, OM1 and RPDA   CORONARY ARTERY BYPASS GRAFT  03/1998   5 vessel, [facility]   RIGHT HEART CATH N/A 04/04/2019   Procedure: RIGHT HEART CATH;  Surgeon: Iran Ouch, MD;  Location: [facility] INVASIVE CV LAB;  Service: Cardiovascular;  Laterality: N/A;   RIGHT/LEFT HEART CATH AND CORONARY ANGIOGRAPHY N/A 02/01/2018   Procedure: RIGHT/LEFT HEART CATH AND CORONARY ANGIOGRAPHY;  Surgeon: Iran Ouch, MD;  Location: [facility] INVASIVE CV LAB;  Service: Cardiovascular;  Laterality: N/A;   Patient Active Problem List   Diagnosis Date Noted   Adverse drug reaction 01/20/2023   History of COVID-19 03/18/2022   History of diabetes mellitus, type II 03/18/2022   Hospital discharge follow-up 05/16/2021   Generalized  weakness 05/07/2021   Fall    Head injury    Bradycardia    Anemia, unspecified 02/07/2021   Neutropenia (HCC) 01/29/2021   Thrombocytopenia (HCC) 01/29/2021   Hyponatremia 12/22/2020   Bilateral leg weakness 12/20/2020   Prostate cancer screening 09/08/2020   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) 09/06/2020   Low back pain 08/19/2019   Pulmonary hypertension (HCC)    Insomnia 12/21/2018   Sleep apnea in adult 12/09/2018   Periodic limb movement disorder 12/09/2018   Pulmonary nodules 05/18/2018   Wears hearing aid in both ears 05/17/2018   Leg pain, bilateral 02/20/2018   Dyspnea    CKD (chronic kidney disease) stage 3, GFR 30-59 ml/min (HCC) 09/21/2017   History of skin cancer in adulthood 08/14/2016   Parkinson's disease (HCC) 08/09/2016   Bilateral carotid artery stenosis 04/02/2015   Vertigo, peripheral 10/17/2014   Benign prostatic hyperplasia with urinary frequency 01/31/2014   Encounter for Medicare annual wellness exam 07/02/2013   Obesity 04/03/2013   Other malaise and fatigue 09/21/2012   Hyperlipidemia    Hypertension    3-vessel coronary artery disease    S/P CABG x 5     ONSET DATE: 01/20/23  REFERRING DIAG: W09.811 (ICD-10-CM) - Weakness of both  lower extremities   THERAPY DIAG:  Unsteadiness on feet  Abnormality of gait and mobility  Difficulty in walking, not elsewhere classified  Muscle weakness (generalized)  Rationale for Evaluation and Treatment: Rehabilitation  SUBJECTIVE:                                                                                                                                                                                             SUBJECTIVE STATEMENT:  Pt reports his back is feeing better today. Reports pain was not as bad after last session. No falls or other updates.   Pt accompanied by: self  PERTINENT HISTORY: PD, history of COVID and significant weakness present following this  diagnosis and the use of paxlovid ( August 5 dx date, thinks Paxlovid may have exacerbated PD symptoms)   Pt reports having COVID and having significant weakness.  Patient reports he also has back pain that is not present for a long time but was exacerbated more recently.  Patient previously did physical therapy for his back and experienced some relief but has not been consistent with the exercises.  Patient also has history of going to Parkinson's rock steady classes multiple times per week prior to onset of his COVID but he has not been back since. Patient previously ambulated without an assistive device but is now ambulating with a straight point cane.  Patient reports increased foot and ankle weakness in comparison with his hips and knees.  Patient also reports low back pain that is exacerbated with prolonged standing. PAIN:  Are you having pain? Yes: NPRS scale: 6-7/10 Pain location: lower back  Pain description: pin when standing upright for prolonged periods  Aggravating factors: standing prolonged periods  Relieving factors: sitting completely alleviates pain   PRECAUTIONS: Fall  RED FLAGS: None   WEIGHT BEARING RESTRICTIONS: No  FALLS: Has patient fallen in last 6 months? Yes. Number of falls 2-3  LIVING ENVIRONMENT: Lives with: lives with their family and lives with their spouse Lives in: House/apartment Stairs: Yes: Internal: 15 steps; on left going up and External: 1 steps; none Has following equipment at home: Quad cane large base  PLOF: Independent  PATIENT GOALS: To improve strength to prior to his COVID diagnosis   OBJECTIVE:   DIAGNOSTIC FINDINGS: n/a  COGNITION: Overall cognitive status: Within functional limits for tasks assessed   SENSATION: Not tested      POSTURE: rounded shoulders, forward head, and posterior pelvic tilt   LOWER EXTREMITY MMT:    MMT Right Eval Left Eval Right Eval  Left Eval   Hip flexion 4 4 4+ 4+  Hip extension  Hip  abduction 4 4 5 5   Hip adduction 4 4 5 5   Hip internal rotation      Hip external rotation      Knee flexion 4+ 4+ 4+ 4+  Knee extension 4+ 4+ 4+ 4+  Ankle dorsiflexion 4 4 4+ 4+  Ankle plantarflexion      Ankle inversion 4 4 4+ 4+  Ankle eversion 4 4 4+ 4+  (Blank rows = not tested)  BED MOBILITY:  No limitations per pt   TRANSFERS: Assistive device utilized: None  Sit to stand: Complete Independence Stand to sit: Complete Independence Chair to chair: Complete Independence Floor:  Not tested     STAIRS: Level of Assistance: Complete Independence Stair Negotiation Technique: Alternating Pattern  with Bilateral Rails Number of Stairs: 4  Height of Stairs: 6in  Comments:   GAIT: Gait pattern: step through pattern Distance walked: 60 ft Assistive device utilized: None Level of assistance: Complete Independence Comments:   FUNCTIONAL TESTS:  Pt scores 21 / 28 on mini BEST balance test. Scores < 16 indicate increased risk for falls Para March, [place], & [place]) and MCID is 4 (Godi,et al, 2013)      10 Meter Walk Test: Patient instructed to walk 10 meters (32.8 ft) as quickly and as safely as possible at their normal speed x2 Time measured from 2 meter mark to 8 meter mark to accommodate ramp-up and ramp-down.  Normal speed 1: 1 m/s Normal speed 2: 1.01 m/s Average Normal speed: 1 m/s  Cut off scores: <0.4 m/s = household Ambulator, 0.4-0.8 m/s = limited community Ambulator, >0.8 m/s = community Ambulator, >1.2 m/s = crossing a street, <1.0 = increased fall risk MCID 0.05 m/s (small), 0.13 m/s (moderate) (ANPTA Core Set of Outcome Measures for Adults with Neurologic Conditions, 2018)  Five times Sit to Stand Test (FTSS)  TIME: 11.16 sec  Cut off scores indicative of increased fall risk: >12 sec CVA, >16 sec PD, >13 sec vestibular (ANPTA Core Set of Outcome Measures for Adults with Neurologic Conditions, 2018)   PATIENT SURVEYS:  FOTO: 53  TODAY'S TREATMENT:                                                                                                                                TE  Octane level 3 x 6 min for aerobic priming and for UE and LE reciprocal large movement initiation   Trx lunge with HABD for posture 2 x 10 ea LE  Trx row 2 x 12 reps   NMR   Ambulation with 2.5# AW and metronome at 80-85 BPM x 2 rounds with the below between sets  - STS with PWR! Up mod and with GTB resistance for HABD x 2 sets of 10 reps   Seated PWR! Twist and step x 10 ea to ea side  - 2.5# AW donned for resistance with step, cues for quick amplitude for twist   Standing  PWR! Step on airex beam x 10 ea side      PWR! Up targets postural strengthening and antigravity extension, PRW! Rock targets functional weight shifting, PRW! Twist targets trunk rotation and PRW! Step targets transition movements. PWR! Moves target bradykinesia, rigidity, and dyskinesia through targeted functional movements that address four core movement difficulties for people with Parkinson's disease.  PATIENT EDUCATION: Education details: POC .  Pt educated throughout session about proper posture and technique with exercises. Improved exercise technique, movement at target joints, use of target muscles after min to mod verbal, visual, tactile cues.  Person educated: Patient Education method: Explanation Education comprehension: verbalized understanding  HOME EXERCISE PROGRAM: Access Code: [facility] URL: [email or web address] Date: 01/30/2023 Prepared by: Thresa Ross  Exercises - Shoulder extension with resistance - Neutral  - 1 x daily - 7 x weekly - 2 sets - 12 reps - Standing Shoulder Row with Anchored Resistance  - 1 x daily - 7 x weekly - 2 sets - 15 reps - Shoulder External Rotation and Scapular Retraction with Resistance  - 1 x daily - 7 x weekly - 2 sets - 10 reps - Seated Shoulder Horizontal Abduction with Resistance - Palms Down  - 1 x daily - 7 x  weekly - 2 sets - 12 reps -Standing PWR! Moves x 10 ea (UP, ROCK, TWIST,STEP)  GOALS: Goals reviewed with patient? Yes  SHORT TERM GOALS: Target date: 04/09/2023    Patient will be independent in home exercise program to improve strength/mobility for better functional independence with ADLs. Baseline: No HEP currently  10/2: Was doing them but has not been lately  Goal status: NOT MET    LONG TERM GOALS: Target date: 05/07/2023  1.  Patient will increase FOTO score to equal to or greater than  62   to demonstrate statistically significant improvement in mobility and quality of life.  Baseline: 53  10/2:52 Goal status: ONGOING   2.  Patient will increase Mini BEST Balance score by > 4  points to demonstrate decreased fall risk during functional activities. Baseline: 21 Goal status: ONGOING   3.   Patient will improve LE muscle strength to > 4+/5 in hip, knee and ankle strength in order to indicate return to prior level of LE strength  Baseline: see eval chart  Goal status: MET  5.   Patient will increase six minute walk test distance to >1100 with pain less than 5/10 throughout for progression to community ambulator and improve gait ability without discomfort Baseline: 890 ft in 5 minutes, througohut test but more profoundly at 4 minutes pt began to have stooped kyphotic posture. This worsened and was followed by back pain (6/10) that caused the test to be suspended at 5 minutes.  10/2: 990 ft with similar progression of flexed posture throughout. Pain 5/10 at 5:30  Goal status: INITIAL    ASSESSMENT:  CLINICAL IMPRESSION:  Pt reports his low back feeling closer to his baseline pain and more of his postural and PD specific activities were better tolerated this date. Pt showing improved posture and no pain with interval walking with metronome. Will continue this at future sessions as pt low back pain allows. Patient will continue to benefit from skilled physical therapy to  improve his posture, ambulatory endurance without back pain, and overall quality of life.    OBJECTIVE IMPAIRMENTS: decreased activity tolerance, decreased balance, decreased mobility, difficulty walking, and decreased strength.   ACTIVITY LIMITATIONS: standing, squatting, stairs, and locomotion level  PARTICIPATION LIMITATIONS: community  activity  PERSONAL FACTORS: 3+ comorbidities: PD, HLD, HTN, Back pain  are also affecting patient's functional outcome.   REHAB POTENTIAL: Good  CLINICAL DECISION MAKING: Stable/uncomplicated  EVALUATION COMPLEXITY: Low  PLAN:  PT FREQUENCY: 2x/week  PT DURATION: 6 weeks  PLANNED INTERVENTIONS: Therapeutic exercises, Therapeutic activity, Neuromuscular re-education, Balance training, Gait training, Patient/Family education, Self Care, Joint mobilization, Joint manipulation, Spinal manipulation, Spinal mobilization, Moist heat, and Manual therapy  PLAN FOR NEXT SESSION:   Continue Antigravity extension activities to improve posture with prolonged ambulation, PD specific exercises with balance focus and postural focus.   Norman Herrlich PT ,DPT Physical Therapist- [facility]  11:08 AM 04/08/23

## 2023-04-13 ENCOUNTER — Ambulatory Visit: Payer: Medicare Other | Attending: Internal Medicine | Admitting: Physical Therapy

## 2023-04-13 ENCOUNTER — Encounter: Payer: Self-pay | Admitting: Physical Therapy

## 2023-04-13 DIAGNOSIS — R2681 Unsteadiness on feet: Secondary | ICD-10-CM | POA: Diagnosis not present

## 2023-04-13 DIAGNOSIS — R2689 Other abnormalities of gait and mobility: Secondary | ICD-10-CM | POA: Insufficient documentation

## 2023-04-13 DIAGNOSIS — R278 Other lack of coordination: Secondary | ICD-10-CM | POA: Insufficient documentation

## 2023-04-13 DIAGNOSIS — M5459 Other low back pain: Secondary | ICD-10-CM | POA: Diagnosis not present

## 2023-04-13 DIAGNOSIS — R29898 Other symptoms and signs involving the musculoskeletal system: Secondary | ICD-10-CM | POA: Diagnosis not present

## 2023-04-13 DIAGNOSIS — M6281 Muscle weakness (generalized): Secondary | ICD-10-CM | POA: Insufficient documentation

## 2023-04-13 DIAGNOSIS — R269 Unspecified abnormalities of gait and mobility: Secondary | ICD-10-CM | POA: Insufficient documentation

## 2023-04-13 DIAGNOSIS — R262 Difficulty in walking, not elsewhere classified: Secondary | ICD-10-CM | POA: Diagnosis not present

## 2023-04-13 NOTE — Therapy (Signed)
OUTPATIENT PHYSICAL THERAPY NEURO TREATMENT    Patient Name: Johnathan Arnold. MRN: [id number] DOB:[birth year], 77 y.o., male Today's Date: 04/13/2023   PCP: [name], MD   REFERRING PROVIDER: [name], MD   END OF SESSION:  PT End of Session - 04/13/23 1019     Visit Number 16    Number of Visits 23   reflecting most recent recert   Date for PT Re-Evaluation 05/07/23    Authorization Type Medicare    Progress Note Due on Visit 20    PT Start Time 1017    PT Stop Time 1058    PT Time Calculation (min) 41 min    Equipment Utilized During Treatment Gait belt    Activity Tolerance Patient tolerated treatment well    Behavior During Therapy WFL for tasks assessed/performed                      Past Medical History:  Diagnosis Date   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 LIMA to LAD, SVG to D1, OM1 and RPDA   CORONARY ARTERY BYPASS GRAFT  03/1998   5 vessel, [facility]   RIGHT HEART CATH N/A 04/04/2019   Procedure: RIGHT HEART CATH;  Surgeon: Iran Ouch, MD;  Location: [facility] INVASIVE CV LAB;  Service: Cardiovascular;  Laterality: N/A;   RIGHT/LEFT HEART CATH AND CORONARY ANGIOGRAPHY N/A 02/01/2018   Procedure: RIGHT/LEFT HEART CATH AND CORONARY ANGIOGRAPHY;  Surgeon: Iran Ouch, MD;  Location: [facility] INVASIVE CV LAB;  Service: Cardiovascular;  Laterality: N/A;   Patient Active Problem List   Diagnosis Date Noted   Adverse drug reaction 01/20/2023   History of COVID-19 03/18/2022   History of diabetes mellitus, type II 03/18/2022   Hospital discharge follow-up  05/16/2021   Generalized weakness 05/07/2021   Fall    Head injury    Bradycardia    Anemia, unspecified 02/07/2021   Neutropenia (HCC) 01/29/2021   Thrombocytopenia (HCC) 01/29/2021   Hyponatremia 12/22/2020   Bilateral leg weakness 12/20/2020   Prostate cancer screening 09/08/2020   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) 09/06/2020   Low back pain 08/19/2019   Pulmonary hypertension (HCC)    Insomnia 12/21/2018   Sleep apnea in adult 12/09/2018   Periodic limb movement disorder 12/09/2018   Pulmonary nodules 05/18/2018   Wears hearing aid in both ears 05/17/2018   Leg pain, bilateral 02/20/2018   Dyspnea    CKD (chronic kidney disease) stage 3, GFR 30-59 ml/min (HCC) 09/21/2017   History of skin cancer in adulthood 08/14/2016   Parkinson's disease (HCC) 08/09/2016   Bilateral carotid artery stenosis 04/02/2015   Vertigo, peripheral 10/17/2014   Benign prostatic hyperplasia with urinary frequency 01/31/2014   Encounter for Medicare annual wellness exam 07/02/2013   Obesity 04/03/2013   Other malaise and fatigue 09/21/2012   Hyperlipidemia    Hypertension    3-vessel coronary artery disease    S/P CABG x 5     ONSET DATE: 01/20/23  REFERRING DIAG:  R29.898 (ICD-10-CM) - Weakness of both lower extremities   THERAPY DIAG:  Unsteadiness on feet  Abnormality of gait and mobility  Difficulty in walking, not elsewhere classified  Muscle weakness (generalized)  Rationale for Evaluation and Treatment: Rehabilitation  SUBJECTIVE:                                                                                                                                                                                             SUBJECTIVE STATEMENT:  Pt reports he had a busy [place] with his [place] for about 4 hours. No updates since last session.   Pt accompanied by: self  PERTINENT HISTORY: PD, history of COVID and significant  weakness present following this diagnosis and the use of paxlovid ( August 5 dx date, thinks Paxlovid may have exacerbated PD symptoms)   Pt reports having COVID and having significant weakness.  Patient reports he also has back pain that is not present for a long time but was exacerbated more recently.  Patient previously did physical therapy for his back and experienced some relief but has not been consistent with the exercises.  Patient also has history of going to Parkinson's rock steady classes multiple times per week prior to onset of his COVID but he has not been back since. Patient previously ambulated without an assistive device but is now ambulating with a straight point cane.  Patient reports increased foot and ankle weakness in comparison with his hips and knees.  Patient also reports low back pain that is exacerbated with prolonged standing. PAIN:  Are you having pain? Yes: NPRS scale: 6-7/10 Pain location: lower back  Pain description: pin when standing upright for prolonged periods  Aggravating factors: standing prolonged periods  Relieving factors: sitting completely alleviates pain   PRECAUTIONS: Fall  RED FLAGS: None   WEIGHT BEARING RESTRICTIONS: No  FALLS: Has patient fallen in last 6 months? Yes. Number of falls 2-3  LIVING ENVIRONMENT: Lives with: lives with their family and lives with their spouse Lives in: House/apartment Stairs: Yes: Internal: 15 steps; on left going up and External: 1 steps; none Has following equipment at home: Quad cane large base  PLOF: Independent  PATIENT GOALS: To improve strength to prior to his COVID diagnosis   OBJECTIVE:   DIAGNOSTIC FINDINGS: n/a  COGNITION: Overall cognitive status: Within functional limits for tasks assessed   SENSATION: Not tested      POSTURE: rounded shoulders, forward head, and posterior pelvic tilt   LOWER EXTREMITY MMT:    MMT Right Eval Left Eval Right Eval  Left Eval   Hip flexion  4 4  4+ 4+  Hip extension      Hip abduction 4 4 5 5   Hip adduction 4 4 5 5   Hip internal rotation      Hip external rotation      Knee flexion 4+ 4+ 4+ 4+  Knee extension 4+ 4+ 4+ 4+  Ankle dorsiflexion 4 4 4+ 4+  Ankle plantarflexion      Ankle inversion 4 4 4+ 4+  Ankle eversion 4 4 4+ 4+  (Blank rows = not tested)  BED MOBILITY:  No limitations per pt   TRANSFERS: Assistive device utilized: None  Sit to stand: Complete Independence Stand to sit: Complete Independence Chair to chair: Complete Independence Floor:  Not tested     STAIRS: Level of Assistance: Complete Independence Stair Negotiation Technique: Alternating Pattern  with Bilateral Rails Number of Stairs: 4  Height of Stairs: 6in  Comments:   GAIT: Gait pattern: step through pattern Distance walked: 60 ft Assistive device utilized: None Level of assistance: Complete Independence Comments:   FUNCTIONAL TESTS:  Pt scores 21 / 28 on mini BEST balance test. Scores < 16 indicate increased risk for falls [place], [place], [place]) and MCID is 4 (Godi,et al, 2013)      10 Meter Walk Test: Patient instructed to walk 10 meters (32.8 ft) as quickly and as safely as possible at their normal speed x2 Time measured from 2 meter mark to 8 meter mark to accommodate ramp-up and ramp-down.  Normal speed 1: 1 m/s Normal speed 2: 1.01 m/s Average Normal speed: 1 m/s  Cut off scores: <0.4 m/s = household Ambulator, 0.4-0.8 m/s = limited community Ambulator, >0.8 m/s = community Ambulator, >1.2 m/s = crossing a street, <1.0 = increased fall risk MCID 0.05 m/s (small), 0.13 m/s (moderate) (ANPTA Core Set of Outcome Measures for Adults with Neurologic Conditions, 2018)  Five times Sit to Stand Test (FTSS)  TIME: 11.16 sec  Cut off scores indicative of increased fall risk: >12 sec CVA, >16 sec PD, >13 sec vestibular (ANPTA Core Set of Outcome Measures for Adults with Neurologic Conditions, 2018)   PATIENT SURVEYS:   FOTO: 53  TODAY'S TREATMENT:                                                                                                                               TE  Octane level 3 x 6 min for aerobic priming and for UE and LE reciprocal large movement initiation   Trx lunge with HABD for posture 2 x 10 ea LE   Trx row 2 x 12 reps   Seated HABD 2 x 10 with GTB   NMR   Ambulation with 5# AW and metronome at 80 BPM x 2 rounds with the below between sets x 400 ft each round, no reports of back pain.   Seated PWR! Up, rock, twist and step with 1# wrist weights donned x 10 ea side  and 2 x 10 PWR! Up    PWR! Up targets postural strengthening and antigravity extension, PRW! Rock targets functional weight shifting, PRW! Twist targets trunk rotation and PRW! Step targets transition movements. PWR! Moves target bradykinesia, rigidity, and dyskinesia through targeted functional movements that address four core movement difficulties for people with Parkinson's disease.  PATIENT EDUCATION: Education details: POC .  Pt educated throughout session about proper posture and technique with exercises. Improved exercise technique, movement at target joints, use of target muscles after min to mod verbal, visual, tactile cues.  Person educated: Patient Education method: Explanation Education comprehension: verbalized understanding  HOME EXERCISE PROGRAM: Access Code: [facility] URL: [email or web address] Date: 01/30/2023 Prepared by: Thresa Ross  Exercises - Shoulder extension with resistance - Neutral  - 1 x daily - 7 x weekly - 2 sets - 12 reps - Standing Shoulder Row with Anchored Resistance  - 1 x daily - 7 x weekly - 2 sets - 15 reps - Shoulder External Rotation and Scapular Retraction with Resistance  - 1 x daily - 7 x weekly - 2 sets - 10 reps - Seated Shoulder Horizontal Abduction with Resistance - Palms Down  - 1 x daily - 7 x weekly - 2 sets - 12 reps -Standing PWR! Moves x  10 ea (UP, ROCK, TWIST,STEP)  GOALS: Goals reviewed with patient? Yes  SHORT TERM GOALS: Target date: 04/09/2023    Patient will be independent in home exercise program to improve strength/mobility for better functional independence with ADLs. Baseline: No HEP currently  10/2: Was doing them but has not been lately  Goal status: NOT MET    LONG TERM GOALS: Target date: 05/07/2023  1.  Patient will increase FOTO score to equal to or greater than  62   to demonstrate statistically significant improvement in mobility and quality of life.  Baseline: 53  10/2:52 Goal status: ONGOING   2.  Patient will increase Mini BEST Balance score by > 4  points to demonstrate decreased fall risk during functional activities. Baseline: 21 Goal status: ONGOING   3.   Patient will improve LE muscle strength to > 4+/5 in hip, knee and ankle strength in order to indicate return to prior level of LE strength  Baseline: see eval chart  Goal status: MET  5.   Patient will increase six minute walk test distance to >1100 with pain less than 5/10 throughout for progression to community ambulator and improve gait ability without discomfort Baseline: 890 ft in 5 minutes, througohut test but more profoundly at 4 minutes pt began to have stooped kyphotic posture. This worsened and was followed by back pain (6/10) that caused the test to be suspended at 5 minutes.  10/2: 990 ft with similar progression of flexed posture throughout. Pain 5/10 at 5:30  Goal status: INITIAL    ASSESSMENT:  CLINICAL IMPRESSION:  Patient arrived with good motivation form completion of pt activities.  Pt progresses with ambulation with 5# AW and increased distance without reports of back pain exacerbation. Pt shows no imbalance with ambulation and good ability to maintain erect trunk posture. Pt will continue to benefit from skilled physical therapy intervention to address impairments, improve QOL, and attain therapy goals.      OBJECTIVE IMPAIRMENTS: decreased activity tolerance, decreased balance, decreased mobility, difficulty walking, and decreased strength.   ACTIVITY LIMITATIONS: standing, squatting, stairs, and locomotion level  PARTICIPATION LIMITATIONS: community activity  PERSONAL FACTORS: 3+ comorbidities: PD, HLD, HTN, Back pain  are also affecting patient's  functional outcome.   REHAB POTENTIAL: Good  CLINICAL DECISION MAKING: Stable/uncomplicated  EVALUATION COMPLEXITY: Low  PLAN:  PT FREQUENCY: 2x/week  PT DURATION: 6 weeks  PLANNED INTERVENTIONS: Therapeutic exercises, Therapeutic activity, Neuromuscular re-education, Balance training, Gait training, Patient/Family education, Self Care, Joint mobilization, Joint manipulation, Spinal manipulation, Spinal mobilization, Moist heat, and Manual therapy  PLAN FOR NEXT SESSION:   Continue Antigravity extension activities to improve posture with prolonged ambulation, PD specific exercises with balance focus and postural focus.   Norman Herrlich PT ,DPT Physical Therapist- [employer]  10:20 AM 04/13/23

## 2023-04-15 ENCOUNTER — Ambulatory Visit: Payer: Medicare Other | Admitting: Physical Therapy

## 2023-04-15 ENCOUNTER — Encounter: Payer: Self-pay | Admitting: Physical Therapy

## 2023-04-15 DIAGNOSIS — R2681 Unsteadiness on feet: Secondary | ICD-10-CM | POA: Diagnosis not present

## 2023-04-15 DIAGNOSIS — R269 Unspecified abnormalities of gait and mobility: Secondary | ICD-10-CM

## 2023-04-15 DIAGNOSIS — R262 Difficulty in walking, not elsewhere classified: Secondary | ICD-10-CM

## 2023-04-15 DIAGNOSIS — M6281 Muscle weakness (generalized): Secondary | ICD-10-CM | POA: Diagnosis not present

## 2023-04-15 DIAGNOSIS — R2689 Other abnormalities of gait and mobility: Secondary | ICD-10-CM | POA: Diagnosis not present

## 2023-04-15 DIAGNOSIS — R278 Other lack of coordination: Secondary | ICD-10-CM | POA: Diagnosis not present

## 2023-04-15 DIAGNOSIS — M5459 Other low back pain: Secondary | ICD-10-CM

## 2023-04-15 NOTE — Therapy (Signed)
OUTPATIENT PHYSICAL THERAPY NEURO TREATMENT    Patient Name: Johnathan Arnold. MRN: [id number] DOB:1946/03/04, 77 y.o., male Today's Date: 04/15/2023   PCP: [name], MD   REFERRING PROVIDER: [name], MD   END OF SESSION:  PT End of Session - 04/15/23 1004     Visit Number 17    Number of Visits 23    Date for PT Re-Evaluation 05/07/23    Authorization Type Medicare    Progress Note Due on Visit 20    PT Start Time 1012    PT Stop Time 1054    PT Time Calculation (min) 42 min    Equipment Utilized During Treatment Gait belt    Activity Tolerance Patient tolerated treatment well    Behavior During Therapy WFL for tasks assessed/performed                      Past Medical History:  Diagnosis Date   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 LIMA to LAD, SVG to D1, OM1 and RPDA   CORONARY ARTERY BYPASS GRAFT  03/1998   5 vessel, [facility]   RIGHT HEART CATH N/A 04/04/2019   Procedure: RIGHT HEART CATH;  Surgeon: Iran Ouch, MD;  Location: [facility] INVASIVE CV LAB;  Service: Cardiovascular;  Laterality: N/A;   RIGHT/LEFT HEART CATH AND CORONARY ANGIOGRAPHY N/A 02/01/2018   Procedure: RIGHT/LEFT HEART CATH AND CORONARY ANGIOGRAPHY;  Surgeon: Iran Ouch, MD;  Location: [facility] INVASIVE CV LAB;  Service: Cardiovascular;  Laterality: N/A;   Patient Active Problem List   Diagnosis Date Noted   Adverse drug reaction 01/20/2023   History of COVID-19 03/18/2022   History of diabetes mellitus, type II 03/18/2022   Hospital discharge follow-up 05/16/2021   Generalized  weakness 05/07/2021   Fall    Head injury    Bradycardia    Anemia, unspecified 02/07/2021   Neutropenia (HCC) 01/29/2021   Thrombocytopenia (HCC) 01/29/2021   Hyponatremia 12/22/2020   Bilateral leg weakness 12/20/2020   Prostate cancer screening 09/08/2020   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) 09/06/2020   Low back pain 08/19/2019   Pulmonary hypertension (HCC)    Insomnia 12/21/2018   Sleep apnea in adult 12/09/2018   Periodic limb movement disorder 12/09/2018   Pulmonary nodules 05/18/2018   Wears hearing aid in both ears 05/17/2018   Leg pain, bilateral 02/20/2018   Dyspnea    CKD (chronic kidney disease) stage 3, GFR 30-59 ml/min (HCC) 09/21/2017   History of skin cancer in adulthood 08/14/2016   Parkinson's disease (HCC) 08/09/2016   Bilateral carotid artery stenosis 04/02/2015   Vertigo, peripheral 10/17/2014   Benign prostatic hyperplasia with urinary frequency 01/31/2014   Encounter for Medicare annual wellness exam 07/02/2013   Obesity 04/03/2013   Other malaise and fatigue 09/21/2012   Hyperlipidemia    Hypertension    3-vessel coronary artery disease    S/P CABG x 5     ONSET DATE: 01/20/23  REFERRING DIAG: N02.725 (ICD-10-CM) - Weakness of  both lower extremities   THERAPY DIAG:  Unsteadiness on feet  Abnormality of gait and mobility  Muscle weakness (generalized)  Difficulty in walking, not elsewhere classified  Other low back pain  Rationale for Evaluation and Treatment: Rehabilitation  SUBJECTIVE:                                                                                                                                                                                             SUBJECTIVE STATEMENT:  Pt reported no changes since last visit. Pt reported 0/10 on NPS.   Pt accompanied by: self  PERTINENT HISTORY: PD, history of COVID and significant weakness present following this diagnosis and the use of paxlovid (  August 5 dx date, thinks Paxlovid may have exacerbated PD symptoms)   Pt reports having COVID and having significant weakness.  Patient reports he also has back pain that is not present for a long time but was exacerbated more recently.  Patient previously did physical therapy for his back and experienced some relief but has not been consistent with the exercises.  Patient also has history of going to Parkinson's rock steady classes multiple times per week prior to onset of his COVID but he has not been back since. Patient previously ambulated without an assistive device but is now ambulating with a straight point cane.  Patient reports increased foot and ankle weakness in comparison with his hips and knees.  Patient also reports low back pain that is exacerbated with prolonged standing. PAIN:  Are you having pain? Yes: NPRS scale: 6-7/10 Pain location: lower back  Pain description: pin when standing upright for prolonged periods  Aggravating factors: standing prolonged periods  Relieving factors: sitting completely alleviates pain   PRECAUTIONS: Fall  RED FLAGS: None   WEIGHT BEARING RESTRICTIONS: No  FALLS: Has patient fallen in last 6 months? Yes. Number of falls 2-3  LIVING ENVIRONMENT: Lives with: lives with their family and lives with their spouse Lives in: House/apartment Stairs: Yes: Internal: 15 steps; on left going up and External: 1 steps; none Has following equipment at home: Quad cane large base  PLOF: Independent  PATIENT GOALS: To improve strength to prior to his COVID diagnosis   OBJECTIVE:   DIAGNOSTIC FINDINGS: n/a  COGNITION: Overall cognitive status: Within functional limits for tasks assessed   SENSATION: Not tested      POSTURE: rounded shoulders, forward head, and posterior pelvic tilt   LOWER EXTREMITY MMT:    MMT Right Eval Left Eval Right Eval  Left Eval   Hip flexion 4 4 4+ 4+  Hip extension  Hip abduction 4 4 5 5   Hip adduction 4 4  5 5   Hip internal rotation      Hip external rotation      Knee flexion 4+ 4+ 4+ 4+  Knee extension 4+ 4+ 4+ 4+  Ankle dorsiflexion 4 4 4+ 4+  Ankle plantarflexion      Ankle inversion 4 4 4+ 4+  Ankle eversion 4 4 4+ 4+  (Blank rows = not tested)  BED MOBILITY:  No limitations per pt   TRANSFERS: Assistive device utilized: None  Sit to stand: Complete Independence Stand to sit: Complete Independence Chair to chair: Complete Independence Floor:  Not tested     STAIRS: Level of Assistance: Complete Independence Stair Negotiation Technique: Alternating Pattern  with Bilateral Rails Number of Stairs: 4  Height of Stairs: 6in  Comments:   GAIT: Gait pattern: step through pattern Distance walked: 60 ft Assistive device utilized: None Level of assistance: Complete Independence Comments:   FUNCTIONAL TESTS:  Pt scores 21 / 28 on mini BEST balance test. Scores < 16 indicate increased risk for falls [place], [place], [facility] [place]) and MCID is 4 (Godi,et al, 2013)      10 Meter Walk Test: Patient instructed to walk 10 meters (32.8 ft) as quickly and as safely as possible at their normal speed x2 Time measured from 2 meter mark to 8 meter mark to accommodate ramp-up and ramp-down.  Normal speed 1: 1 m/s Normal speed 2: 1.01 m/s Average Normal speed: 1 m/s  Cut off scores: <0.4 m/s = household Ambulator, 0.4-0.8 m/s = limited community Ambulator, >0.8 m/s = community Ambulator, >1.2 m/s = crossing a street, <1.0 = increased fall risk MCID 0.05 m/s (small), 0.13 m/s (moderate) (ANPTA Core Set of Outcome Measures for Adults with Neurologic Conditions, 2018)  Five times Sit to Stand Test (FTSS)  TIME: 11.16 sec  Cut off scores indicative of increased fall risk: >12 sec CVA, >16 sec PD, >13 sec vestibular (ANPTA Core Set of Outcome Measures for Adults with Neurologic Conditions, 2018)   PATIENT SURVEYS:  FOTO: 53  TODAY'S TREATMENT:                                                                                                                                TE  Octane level 3 x 6 min for aerobic priming and for UE and LE reciprocal large movement initiation   Trx lunge with HABD for posture 2 x 10 ea LE   Trx row 2 x 12 reps   Seated HABD 2 x 10 with BTB   NMR   Ambulation with 5# AW and metronome at 80 BPM x 2 rounds with the below between sets x 450 ft each round, no reports of back pain.   Seated PWR! Up, rock, twist and step with 1# wrist weights donned x 10 ea side and 2 x 10 PWR! Up    PWR! Up targets  postural strengthening and antigravity extension, PRW! Rock targets functional weight shifting, PRW! Twist targets trunk rotation and PRW! Step targets transition movements. PWR! Moves target bradykinesia, rigidity, and dyskinesia through targeted functional movements that address four core movement difficulties for people with Parkinson's disease.  PATIENT EDUCATION: Education details: POC .  Pt educated throughout session about proper posture and technique with exercises. Improved exercise technique, movement at target joints, use of target muscles after min to mod verbal, visual, tactile cues.  Person educated: Patient Education method: Explanation Education comprehension: verbalized understanding  HOME EXERCISE PROGRAM: Access Code: [facility] URL: [email or web address] Date: 01/30/2023 Prepared by: Thresa Ross  Exercises - Shoulder extension with resistance - Neutral  - 1 x daily - 7 x weekly - 2 sets - 12 reps - Standing Shoulder Row with Anchored Resistance  - 1 x daily - 7 x weekly - 2 sets - 15 reps - Shoulder External Rotation and Scapular Retraction with Resistance  - 1 x daily - 7 x weekly - 2 sets - 10 reps - Seated Shoulder Horizontal Abduction with Resistance - Palms Down  - 1 x daily - 7 x weekly - 2 sets - 12 reps -Standing PWR! Moves x 10 ea (UP, ROCK, TWIST,STEP)  GOALS: Goals reviewed with patient?  Yes  SHORT TERM GOALS: Target date: 04/09/2023    Patient will be independent in home exercise program to improve strength/mobility for better functional independence with ADLs. Baseline: No HEP currently  10/2: Was doing them but has not been lately  Goal status: NOT MET    LONG TERM GOALS: Target date: 05/07/2023  1.  Patient will increase FOTO score to equal to or greater than  62   to demonstrate statistically significant improvement in mobility and quality of life.  Baseline: 53  10/2:52 Goal status: ONGOING   2.  Patient will increase Mini BEST Balance score by > 4  points to demonstrate decreased fall risk during functional activities. Baseline: 21 Goal status: ONGOING   3.   Patient will improve LE muscle strength to > 4+/5 in hip, knee and ankle strength in order to indicate return to prior level of LE strength  Baseline: see eval chart  Goal status: MET  5.   Patient will increase six minute walk test distance to >1100 with pain less than 5/10 throughout for progression to community ambulator and improve gait ability without discomfort Baseline: 890 ft in 5 minutes, througohut test but more profoundly at 4 minutes pt began to have stooped kyphotic posture. This worsened and was followed by back pain (6/10) that caused the test to be suspended at 5 minutes.  10/2: 990 ft with similar progression of flexed posture throughout. Pain 5/10 at 5:30  Goal status: INITIAL    ASSESSMENT:  CLINICAL IMPRESSION:  Pt tolerated all tasks during today's session. Pt was able to progress to ambulating 50 feet further during each round of ambulation compared to last visit. Also, pt was able to progress to the BTB with seated HABD. Pt reported no signs of fatigue or pain throughout today's visit. Pt will continue to benefit from skilled physical therapy intervention to address impairments, improve QOL, and attain therapy goals.     OBJECTIVE IMPAIRMENTS: decreased activity tolerance,  decreased balance, decreased mobility, difficulty walking, and decreased strength.   ACTIVITY LIMITATIONS: standing, squatting, stairs, and locomotion level  PARTICIPATION LIMITATIONS: community activity  PERSONAL FACTORS: 3+ comorbidities: PD, HLD, HTN, Back pain  are also affecting patient's functional outcome.  REHAB POTENTIAL: Good  CLINICAL DECISION MAKING: Stable/uncomplicated  EVALUATION COMPLEXITY: Low  PLAN:  PT FREQUENCY: 2x/week  PT DURATION: 6 weeks  PLANNED INTERVENTIONS: Therapeutic exercises, Therapeutic activity, Neuromuscular re-education, Balance training, Gait training, Patient/Family education, Self Care, Joint mobilization, Joint manipulation, Spinal manipulation, Spinal mobilization, Moist heat, and Manual therapy  PLAN FOR NEXT SESSION:   Continue Antigravity extension activities to improve posture with prolonged ambulation, PD specific exercises with balance focus and postural focus.   Debara Pickett, SPT   Norman Herrlich PT ,DPT Physical Therapist- [facility]  [facility]  11:55 AM 04/15/23

## 2023-04-20 ENCOUNTER — Ambulatory Visit: Payer: Medicare Other | Admitting: Physical Therapy

## 2023-04-20 ENCOUNTER — Encounter: Payer: Self-pay | Admitting: Physical Therapy

## 2023-04-20 DIAGNOSIS — R2689 Other abnormalities of gait and mobility: Secondary | ICD-10-CM | POA: Diagnosis not present

## 2023-04-20 DIAGNOSIS — R2681 Unsteadiness on feet: Secondary | ICD-10-CM | POA: Diagnosis not present

## 2023-04-20 DIAGNOSIS — R269 Unspecified abnormalities of gait and mobility: Secondary | ICD-10-CM | POA: Diagnosis not present

## 2023-04-20 DIAGNOSIS — R278 Other lack of coordination: Secondary | ICD-10-CM

## 2023-04-20 DIAGNOSIS — M6281 Muscle weakness (generalized): Secondary | ICD-10-CM | POA: Diagnosis not present

## 2023-04-20 DIAGNOSIS — R262 Difficulty in walking, not elsewhere classified: Secondary | ICD-10-CM

## 2023-04-20 NOTE — Therapy (Signed)
OUTPATIENT PHYSICAL THERAPY NEURO TREATMENT    Patient Name: Johnathan Arnold. MRN: [id number] DOB:1945-12-22, 77 y.o., male Today's Date: 04/20/2023   PCP: [name], MD   REFERRING PROVIDER: [name], MD   END OF SESSION:  PT End of Session - 04/20/23 1021     Visit Number 18    Number of Visits 23    Date for PT Re-Evaluation 05/07/23    Authorization Type Medicare    Progress Note Due on Visit 20    PT Start Time 1015    PT Stop Time 1058    PT Time Calculation (min) 43 min    Equipment Utilized During Treatment Gait belt    Activity Tolerance Patient tolerated treatment well    Behavior During Therapy WFL for tasks assessed/performed                       Past Medical History:  Diagnosis Date   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 LIMA to LAD, SVG to D1, OM1 and RPDA   CORONARY ARTERY BYPASS GRAFT  03/1998   5 vessel, [facility]   RIGHT HEART CATH N/A 04/04/2019   Procedure: RIGHT HEART CATH;  Surgeon: Iran Ouch, MD;  Location: [facility] INVASIVE CV LAB;  Service: Cardiovascular;  Laterality: N/A;   RIGHT/LEFT HEART CATH AND CORONARY ANGIOGRAPHY N/A 02/01/2018   Procedure: RIGHT/LEFT HEART CATH AND CORONARY ANGIOGRAPHY;  Surgeon: Iran Ouch, MD;  Location: [facility] INVASIVE CV LAB;  Service: Cardiovascular;  Laterality: N/A;   Patient Active Problem List   Diagnosis Date Noted   Adverse drug reaction 01/20/2023   History of COVID-19 03/18/2022   History of diabetes mellitus, type II 03/18/2022   Hospital discharge follow-up 05/16/2021   Generalized  weakness 05/07/2021   Fall    Head injury    Bradycardia    Anemia, unspecified 02/07/2021   Neutropenia (HCC) 01/29/2021   Thrombocytopenia (HCC) 01/29/2021   Hyponatremia 12/22/2020   Bilateral leg weakness 12/20/2020   Prostate cancer screening 09/08/2020   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) 09/06/2020   Low back pain 08/19/2019   Pulmonary hypertension (HCC)    Insomnia 12/21/2018   Sleep apnea in adult 12/09/2018   Periodic limb movement disorder 12/09/2018   Pulmonary nodules 05/18/2018   Wears hearing aid in both ears 05/17/2018   Leg pain, bilateral 02/20/2018   Dyspnea    CKD (chronic kidney disease) stage 3, GFR 30-59 ml/min (HCC) 09/21/2017   History of skin cancer in adulthood 08/14/2016   Parkinson's disease (HCC) 08/09/2016   Bilateral carotid artery stenosis 04/02/2015   Vertigo, peripheral 10/17/2014   Benign prostatic hyperplasia with urinary frequency 01/31/2014   Encounter for Medicare annual wellness exam 07/02/2013   Obesity 04/03/2013   Other malaise and fatigue 09/21/2012   Hyperlipidemia    Hypertension    3-vessel coronary artery disease    S/P CABG x 5     ONSET DATE: 01/20/23  REFERRING DIAG: H08.657 (ICD-10-CM) - Weakness  of both lower extremities   THERAPY DIAG:  Unsteadiness on feet  Difficulty in walking, not elsewhere classified  Other abnormalities of gait and mobility  Abnormality of gait and mobility  Muscle weakness (generalized)  Other lack of coordination  Rationale for Evaluation and Treatment: Rehabilitation  SUBJECTIVE:                                                                                                                                                                                             SUBJECTIVE STATEMENT:  Pt reported no changes since last visit. Pt reported 0/10 on NPS, but restless.   Pt accompanied by: self  PERTINENT HISTORY: PD, history of COVID and significant  weakness present following this diagnosis and the use of paxlovid ( August 5 dx date, thinks Paxlovid may have exacerbated PD symptoms)   Pt reports having COVID and having significant weakness.  Patient reports he also has back pain that is not present for a long time but was exacerbated more recently.  Patient previously did physical therapy for his back and experienced some relief but has not been consistent with the exercises.  Patient also has history of going to Parkinson's rock steady classes multiple times per week prior to onset of his COVID but he has not been back since. Patient previously ambulated without an assistive device but is now ambulating with a straight point cane.  Patient reports increased foot and ankle weakness in comparison with his hips and knees.  Patient also reports low back pain that is exacerbated with prolonged standing. PAIN:  Are you having pain? Yes: NPRS scale: 6-7/10 Pain location: lower back  Pain description: pin when standing upright for prolonged periods  Aggravating factors: standing prolonged periods  Relieving factors: sitting completely alleviates pain   PRECAUTIONS: Fall  RED FLAGS: None   WEIGHT BEARING RESTRICTIONS: No  FALLS: Has patient fallen in last 6 months? Yes. Number of falls 2-3  LIVING ENVIRONMENT: Lives with: lives with their family and lives with their spouse Lives in: House/apartment Stairs: Yes: Internal: 15 steps; on left going up and External: 1 steps; none Has following equipment at home: Quad cane large base  PLOF: Independent  PATIENT GOALS: To improve strength to prior to his COVID diagnosis   OBJECTIVE:   DIAGNOSTIC FINDINGS: n/a  COGNITION: Overall cognitive status: Within functional limits for tasks assessed   SENSATION: Not tested      POSTURE: rounded shoulders, forward head, and posterior pelvic tilt   LOWER EXTREMITY MMT:    MMT Right Eval Left Eval Right Eval  Left Eval   Hip flexion 4 4  4+ 4+  Hip extension      Hip abduction 4 4 5 5   Hip adduction 4 4 5 5   Hip internal rotation      Hip external rotation      Knee flexion 4+ 4+ 4+ 4+  Knee extension 4+ 4+ 4+ 4+  Ankle dorsiflexion 4 4 4+ 4+  Ankle plantarflexion      Ankle inversion 4 4 4+ 4+  Ankle eversion 4 4 4+ 4+  (Blank rows = not tested)  BED MOBILITY:  No limitations per pt   TRANSFERS: Assistive device utilized: None  Sit to stand: Complete Independence Stand to sit: Complete Independence Chair to chair: Complete Independence Floor:  Not tested     STAIRS: Level of Assistance: Complete Independence Stair Negotiation Technique: Alternating Pattern  with Bilateral Rails Number of Stairs: 4  Height of Stairs: 6in  Comments:   GAIT: Gait pattern: step through pattern Distance walked: 60 ft Assistive device utilized: None Level of assistance: Complete Independence Comments:   FUNCTIONAL TESTS:  Pt scores 21 / 28 on mini BEST balance test. Scores < 16 indicate increased risk for falls Para [place], [place], & [place]) and MCID is 4 (Godi,et al, 2013)      10 Meter Walk Test: Patient instructed to walk 10 meters (32.8 ft) as quickly and as safely as possible at their normal speed x2 Time measured from 2 meter mark to 8 meter mark to accommodate ramp-up and ramp-down.  Normal speed 1: 1 m/s Normal speed 2: 1.01 m/s Average Normal speed: 1 m/s  Cut off scores: <0.4 m/s = household Ambulator, 0.4-0.8 m/s = limited community Ambulator, >0.8 m/s = community Ambulator, >1.2 m/s = crossing a street, <1.0 = increased fall risk MCID 0.05 m/s (small), 0.13 m/s (moderate) (ANPTA Core Set of Outcome Measures for Adults with Neurologic Conditions, 2018)  Five times Sit to Stand Test (FTSS)  TIME: 11.16 sec  Cut off scores indicative of increased fall risk: >12 sec CVA, >16 sec PD, >13 sec vestibular (ANPTA Core Set of Outcome Measures for Adults with Neurologic Conditions, 2018)   PATIENT SURVEYS:   FOTO: 53  TODAY'S TREATMENT:                                                                                                                               TE  Octane level 3 x 6 min for aerobic priming and for UE and LE reciprocal large movement initiation   Trx lunge with HABD for posture 2 x 10 ea LE   Trx row 2 x 12 reps   Seated HABD 2 x 10 with BTB   Seated punching punch mitts 3x10, with last 2 rounds incorporating a cognitive task involving VC's of which mitt to punch  NMR   Ambulation with 5# AW and metronome at 80 BPM x 700 feet, reports of fatigue but no reports of back pain.  -Pt required seated rest break  at about 450 ft and had one fall/ stumble requiring Min A to prevent more severe LOB  Seated PWR! Up, rock, twist and step with 1.5# wrist weights donned x 10 ea side and 2 x 10 PWR! Up    PWR! Up targets postural strengthening and antigravity extension, PRW! Rock targets functional weight shifting, PRW! Twist targets trunk rotation and PRW! Step targets transition movements. PWR! Moves target bradykinesia, rigidity, and dyskinesia through targeted functional movements that address four core movement difficulties for people with Parkinson's disease.  PATIENT EDUCATION: Education details: POC .  Pt educated throughout session about proper posture and technique with exercises. Improved exercise technique, movement at target joints, use of target muscles after min to mod verbal, visual, tactile cues.  Person educated: Patient Education method: Explanation Education comprehension: verbalized understanding  HOME EXERCISE PROGRAM: Access Code: [facility] URL: [email or web address] Date: 01/30/2023 Prepared by: Thresa Ross  Exercises - Shoulder extension with resistance - Neutral  - 1 x daily - 7 x weekly - 2 sets - 12 reps - Standing Shoulder Row with Anchored Resistance  - 1 x daily - 7 x weekly - 2 sets - 15 reps - Shoulder External Rotation and  Scapular Retraction with Resistance  - 1 x daily - 7 x weekly - 2 sets - 10 reps - Seated Shoulder Horizontal Abduction with Resistance - Palms Down  - 1 x daily - 7 x weekly - 2 sets - 12 reps -Standing PWR! Moves x 10 ea (UP, ROCK, TWIST,STEP)  GOALS: Goals reviewed with patient? Yes  SHORT TERM GOALS: Target date: 04/09/2023    Patient will be independent in home exercise program to improve strength/mobility for better functional independence with ADLs. Baseline: No HEP currently  10/2: Was doing them but has not been lately  Goal status: NOT MET    LONG TERM GOALS: Target date: 05/07/2023  1.  Patient will increase FOTO score to equal to or greater than  62   to demonstrate statistically significant improvement in mobility and quality of life.  Baseline: 53  10/2:52 Goal status: ONGOING   2.  Patient will increase Mini BEST Balance score by > 4  points to demonstrate decreased fall risk during functional activities. Baseline: 21 Goal status: ONGOING   3.   Patient will improve LE muscle strength to > 4+/5 in hip, knee and ankle strength in order to indicate return to prior level of LE strength  Baseline: see eval chart  Goal status: MET  5.   Patient will increase six minute walk test distance to >1100 with pain less than 5/10 throughout for progression to community ambulator and improve gait ability without discomfort Baseline: 890 ft in 5 minutes, througohut test but more profoundly at 4 minutes pt began to have stooped kyphotic posture. This worsened and was followed by back pain (6/10) that caused the test to be suspended at 5 minutes.  10/2: 990 ft with similar progression of flexed posture throughout. Pain 5/10 at 5:30  Goal status: INITIAL    ASSESSMENT:  CLINICAL IMPRESSION:  Pt was able to tolerate increased wrist weights with all PWR! Moves, but did not ambulate as far as last session, due to fatigue. Pt tolerated cognitive dual task involving punching punch  mitts. Pt stated during the visit that he felt restless when he woke up this morning, which may be the reason he felt fatigued during today's visit. Pt will continue to benefit from skilled physical therapy intervention to address impairments, improve QOL,  and attain therapy goals.     OBJECTIVE IMPAIRMENTS: decreased activity tolerance, decreased balance, decreased mobility, difficulty walking, and decreased strength.   ACTIVITY LIMITATIONS: standing, squatting, stairs, and locomotion level  PARTICIPATION LIMITATIONS: community activity  PERSONAL FACTORS: 3+ comorbidities: PD, HLD, HTN, Back pain  are also affecting patient's functional outcome.   REHAB POTENTIAL: Good  CLINICAL DECISION MAKING: Stable/uncomplicated  EVALUATION COMPLEXITY: Low  PLAN:  PT FREQUENCY: 2x/week  PT DURATION: 6 weeks  PLANNED INTERVENTIONS: Therapeutic exercises, Therapeutic activity, Neuromuscular re-education, Balance training, Gait training, Patient/Family education, Self Care, Joint mobilization, Joint manipulation, Spinal manipulation, Spinal mobilization, Moist heat, and Manual therapy  PLAN FOR NEXT SESSION:   Continue Antigravity extension activities to improve posture with prolonged ambulation, PD specific exercises with balance focus and postural focus.   Debara Pickett, SPT   This entire session was performed under direct supervision and direction of a licensed Estate agent . I have personally read, edited and approve of the note as written.    This licensed clinician was present and actively directing care throughout the session at all times.  Norman Herrlich PT ,DPT Physical Therapist- [facility]   11:12 AM 04/20/23

## 2023-04-22 ENCOUNTER — Ambulatory Visit: Payer: Medicare Other | Admitting: Internal Medicine

## 2023-04-22 ENCOUNTER — Ambulatory Visit: Payer: Medicare Other | Admitting: Physical Therapy

## 2023-04-27 ENCOUNTER — Ambulatory Visit: Payer: Medicare Other | Admitting: Physical Therapy

## 2023-04-27 DIAGNOSIS — R2689 Other abnormalities of gait and mobility: Secondary | ICD-10-CM | POA: Diagnosis not present

## 2023-04-27 DIAGNOSIS — R2681 Unsteadiness on feet: Secondary | ICD-10-CM | POA: Diagnosis not present

## 2023-04-27 DIAGNOSIS — M6281 Muscle weakness (generalized): Secondary | ICD-10-CM

## 2023-04-27 DIAGNOSIS — R262 Difficulty in walking, not elsewhere classified: Secondary | ICD-10-CM

## 2023-04-27 DIAGNOSIS — R278 Other lack of coordination: Secondary | ICD-10-CM | POA: Diagnosis not present

## 2023-04-27 DIAGNOSIS — R269 Unspecified abnormalities of gait and mobility: Secondary | ICD-10-CM | POA: Diagnosis not present

## 2023-04-27 NOTE — Therapy (Signed)
OUTPATIENT PHYSICAL THERAPY NEURO TREATMENT    Patient Name: Johnathan Arnold. MRN: [id number] DOB:11-Mar-1946, 77 y.o., male Today's Date: 04/27/2023   PCP: [name], MD   REFERRING PROVIDER: [name], MD   END OF SESSION:  PT End of Session - 04/27/23 1355     Visit Number 19    Number of Visits 23    Date for PT Re-Evaluation 05/07/23    Authorization Type Medicare    Progress Note Due on Visit 20    PT Start Time 1015    PT Stop Time 1059    PT Time Calculation (min) 44 min    Equipment Utilized During Treatment Gait belt    Activity Tolerance Patient tolerated treatment well    Behavior During Therapy WFL for tasks assessed/performed                        Past Medical History:  Diagnosis Date   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 LIMA to LAD, SVG to D1, OM1 and RPDA   CORONARY ARTERY BYPASS GRAFT  03/1998   5 vessel, [facility]   RIGHT HEART CATH N/A 04/04/2019   Procedure: RIGHT HEART CATH;  Surgeon: Iran Ouch, MD;  Location: [facility] INVASIVE CV LAB;  Service: Cardiovascular;  Laterality: N/A;   RIGHT/LEFT HEART CATH AND CORONARY ANGIOGRAPHY N/A 02/01/2018   Procedure: RIGHT/LEFT HEART CATH AND CORONARY ANGIOGRAPHY;  Surgeon: Iran Ouch, MD;  Location: [facility] INVASIVE CV LAB;  Service: Cardiovascular;  Laterality: N/A;   Patient Active Problem List   Diagnosis Date Noted   Adverse drug reaction 01/20/2023   History of COVID-19 03/18/2022   History of diabetes mellitus, type II 03/18/2022   Hospital discharge follow-up 05/16/2021   Generalized  weakness 05/07/2021   Fall    Head injury    Bradycardia    Anemia, unspecified 02/07/2021   Neutropenia (HCC) 01/29/2021   Thrombocytopenia (HCC) 01/29/2021   Hyponatremia 12/22/2020   Bilateral leg weakness 12/20/2020   Prostate cancer screening 09/08/2020   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) 09/06/2020   Low back pain 08/19/2019   Pulmonary hypertension (HCC)    Insomnia 12/21/2018   Sleep apnea in adult 12/09/2018   Periodic limb movement disorder 12/09/2018   Pulmonary nodules 05/18/2018   Wears hearing aid in both ears 05/17/2018   Leg pain, bilateral 02/20/2018   Dyspnea    CKD (chronic kidney disease) stage 3, GFR 30-59 ml/min (HCC) 09/21/2017   History of skin cancer in adulthood 08/14/2016   Parkinson's disease (HCC) 08/09/2016   Bilateral carotid artery stenosis 04/02/2015   Vertigo, peripheral 10/17/2014   Benign prostatic hyperplasia with urinary frequency 01/31/2014   Encounter for Medicare annual wellness exam 07/02/2013   Obesity 04/03/2013   Other malaise and fatigue 09/21/2012   Hyperlipidemia    Hypertension    3-vessel coronary artery disease    S/P CABG x 5     ONSET DATE: 01/20/23  REFERRING DIAG: U27.253 (ICD-10-CM) -  Weakness of both lower extremities   THERAPY DIAG:  No diagnosis found.  Rationale for Evaluation and Treatment: Rehabilitation  SUBJECTIVE:                                                                                                                                                                                             SUBJECTIVE STATEMENT:  Pt reported no changes since last visit. Pt reported 0/10 on NPS, but restless.   Pt accompanied by: self  PERTINENT HISTORY: PD, history of COVID and significant weakness present following this diagnosis and the use of paxlovid ( August 5 dx date, thinks Paxlovid may have exacerbated PD symptoms)   Pt reports having COVID and having significant weakness.   Patient reports he also has back pain that is not present for a long time but was exacerbated more recently.  Patient previously did physical therapy for his back and experienced some relief but has not been consistent with the exercises.  Patient also has history of going to Parkinson's rock steady classes multiple times per week prior to onset of his COVID but he has not been back since. Patient previously ambulated without an assistive device but is now ambulating with a straight point cane.  Patient reports increased foot and ankle weakness in comparison with his hips and knees.  Patient also reports low back pain that is exacerbated with prolonged standing. PAIN:  Are you having pain? Yes: NPRS scale: 6-7/10 Pain location: lower back  Pain description: pin when standing upright for prolonged periods  Aggravating factors: standing prolonged periods  Relieving factors: sitting completely alleviates pain   PRECAUTIONS: Fall  RED FLAGS: None   WEIGHT BEARING RESTRICTIONS: No  FALLS: Has patient fallen in last 6 months? Yes. Number of falls 2-3  LIVING ENVIRONMENT: Lives with: lives with their family and lives with their spouse Lives in: House/apartment Stairs: Yes: Internal: 15 steps; on left going up and External: 1 steps; none Has following equipment at home: Quad cane large base  PLOF: Independent  PATIENT GOALS: To improve strength to prior to his COVID diagnosis   OBJECTIVE:   DIAGNOSTIC FINDINGS: n/a  COGNITION: Overall cognitive status: Within functional limits for tasks assessed   SENSATION: Not tested      POSTURE: rounded shoulders, forward head, and posterior pelvic tilt   LOWER EXTREMITY MMT:    MMT Right Eval Left Eval Right Eval  Left Eval   Hip flexion 4 4 4+ 4+  Hip extension      Hip abduction 4 4 5 5   Hip adduction 4 4 5 5   Hip internal rotation  Hip external rotation      Knee flexion 4+ 4+ 4+ 4+  Knee extension 4+ 4+ 4+ 4+  Ankle  dorsiflexion 4 4 4+ 4+  Ankle plantarflexion      Ankle inversion 4 4 4+ 4+  Ankle eversion 4 4 4+ 4+  (Blank rows = not tested)  BED MOBILITY:  No limitations per pt   TRANSFERS: Assistive device utilized: None  Sit to stand: Complete Independence Stand to sit: Complete Independence Chair to chair: Complete Independence Floor:  Not tested     STAIRS: Level of Assistance: Complete Independence Stair Negotiation Technique: Alternating Pattern  with Bilateral Rails Number of Stairs: 4  Height of Stairs: 6in  Comments:   GAIT: Gait pattern: step through pattern Distance walked: 60 ft Assistive device utilized: None Level of assistance: Complete Independence Comments:   FUNCTIONAL TESTS:  Pt scores 21 / 28 on mini BEST balance test. Scores < 16 indicate increased risk for falls Para March, Byron, & Hollins) and MCID is 4 (Godi,et al, 2013)      10 Meter Walk Test: Patient instructed to walk 10 meters (32.8 ft) as quickly and as safely as possible at their normal speed x2 Time measured from 2 meter mark to 8 meter mark to accommodate ramp-up and ramp-down.  Normal speed 1: 1 m/s Normal speed 2: 1.01 m/s Average Normal speed: 1 m/s  Cut off scores: <0.4 m/s = household Ambulator, 0.4-0.8 m/s = limited community Ambulator, >0.8 m/s = community Ambulator, >1.2 m/s = crossing a street, <1.0 = increased fall risk MCID 0.05 m/s (small), 0.13 m/s (moderate) (ANPTA Core Set of Outcome Measures for Adults with Neurologic Conditions, 2018)  Five times Sit to Stand Test (FTSS)  TIME: 11.16 sec  Cut off scores indicative of increased fall risk: >12 sec CVA, >16 sec PD, >13 sec vestibular (ANPTA Core Set of Outcome Measures for Adults with Neurologic Conditions, 2018)   PATIENT SURVEYS:  FOTO: 53  TODAY'S TREATMENT:                                                                                                                               TE  Octane level 3 x 6 min for  aerobic priming and for UE and LE reciprocal large movement initiation   Lunge with HABD 2 x 10 reps ( 5 ea side)   Seated TB row 2 x 15 BTB    Seated HABD 2 x 12 with GTB   Seated punching punch mitts x 1 round of 3 hit combos and 1 round of 4 hit combos, pt challenged to recall and complete combo oftar PT calls it out.   NMR  Seated PWR! Up, rock, twist and step with 1.5# wrist weights donned x 10 ea side and 2 x 10 PWR! Up    PWR! Up targets postural strengthening and antigravity extension, PRW! Rock targets functional weight shifting, PRW! Twist targets trunk rotation and PRW!  Step targets transition movements. PWR! Moves target bradykinesia, rigidity, and dyskinesia through targeted functional movements that address four core movement difficulties for people with Parkinson's disease.  PATIENT EDUCATION: Education details: POC .  Pt educated throughout session about proper posture and technique with exercises. Improved exercise technique, movement at target joints, use of target muscles after min to mod verbal, visual, tactile cues.  Person educated: Patient Education method: Explanation Education comprehension: verbalized understanding  HOME EXERCISE PROGRAM: Access Code: [facility] URL: [email or web address] Date: 01/30/2023 Prepared by: Thresa Ross  Exercises - Shoulder extension with resistance - Neutral  - 1 x daily - 7 x weekly - 2 sets - 12 reps - Standing Shoulder Row with Anchored Resistance  - 1 x daily - 7 x weekly - 2 sets - 15 reps - Shoulder External Rotation and Scapular Retraction with Resistance  - 1 x daily - 7 x weekly - 2 sets - 10 reps - Seated Shoulder Horizontal Abduction with Resistance - Palms Down  - 1 x daily - 7 x weekly - 2 sets - 12 reps -Standing PWR! Moves x 10 ea (UP, ROCK, TWIST,STEP)  GOALS: Goals reviewed with patient? Yes  SHORT TERM GOALS: Target date: 04/09/2023    Patient will be independent in home exercise  program to improve strength/mobility for better functional independence with ADLs. Baseline: No HEP currently  10/2: Was doing them but has not been lately  Goal status: NOT MET    LONG TERM GOALS: Target date: 05/07/2023  1.  Patient will increase FOTO score to equal to or greater than  62   to demonstrate statistically significant improvement in mobility and quality of life.  Baseline: 53  10/2:52 Goal status: ONGOING   2.  Patient will increase Mini BEST Balance score by > 4  points to demonstrate decreased fall risk during functional activities. Baseline: 21 Goal status: ONGOING   3.   Patient will improve LE muscle strength to > 4+/5 in hip, knee and ankle strength in order to indicate return to prior level of LE strength  Baseline: see eval chart  Goal status: MET  5.   Patient will increase six minute walk test distance to >1100 with pain less than 5/10 throughout for progression to community ambulator and improve gait ability without discomfort Baseline: 890 ft in 5 minutes, througohut test but more profoundly at 4 minutes pt began to have stooped kyphotic posture. This worsened and was followed by back pain (6/10) that caused the test to be suspended at 5 minutes.  10/2: 990 ft with similar progression of flexed posture throughout. Pain 5/10 at 5:30  Goal status: INITIAL    ASSESSMENT:  CLINICAL IMPRESSION:   Pt was able to tolerate increased wrist weights with all seated PWR! Moves. Pt reported general fatigue this AM so more frequent therapeutic rest breaks were provided.  Pt tolerated progressed cognitive dual task involving punching punch mitts. Pt stated during the visit that he felt restless when he woke up this morning, which may be the reason he felt fatigued during today's visit. Pt will continue to benefit from skilled physical therapy intervention to address impairments, improve QOL, and attain therapy goals.   OBJECTIVE IMPAIRMENTS: decreased activity  tolerance, decreased balance, decreased mobility, difficulty walking, and decreased strength.   ACTIVITY LIMITATIONS: standing, squatting, stairs, and locomotion level  PARTICIPATION LIMITATIONS: community activity  PERSONAL FACTORS: 3+ comorbidities: PD, HLD, HTN, Back pain  are also affecting patient's functional outcome.   REHAB POTENTIAL: Good  CLINICAL DECISION MAKING: Stable/uncomplicated  EVALUATION COMPLEXITY: Low  PLAN:  PT FREQUENCY: 2x/week  PT DURATION: 6 weeks  PLANNED INTERVENTIONS: Therapeutic exercises, Therapeutic activity, Neuromuscular re-education, Balance training, Gait training, Patient/Family education, Self Care, Joint mobilization, Joint manipulation, Spinal manipulation, Spinal mobilization, Moist heat, and Manual therapy  PLAN FOR NEXT SESSION:   Continue Antigravity extension activities to improve posture with prolonged ambulation, PD specific exercises with balance focus and postural focus.   Norman Herrlich PT ,DPT Physical Therapist- [facility]   1:55 PM 04/27/23

## 2023-04-29 ENCOUNTER — Ambulatory Visit: Payer: Medicare Other | Admitting: Physical Therapy

## 2023-04-29 DIAGNOSIS — R269 Unspecified abnormalities of gait and mobility: Secondary | ICD-10-CM

## 2023-04-29 DIAGNOSIS — R262 Difficulty in walking, not elsewhere classified: Secondary | ICD-10-CM | POA: Diagnosis not present

## 2023-04-29 DIAGNOSIS — R2689 Other abnormalities of gait and mobility: Secondary | ICD-10-CM

## 2023-04-29 DIAGNOSIS — M6281 Muscle weakness (generalized): Secondary | ICD-10-CM | POA: Diagnosis not present

## 2023-04-29 DIAGNOSIS — R2681 Unsteadiness on feet: Secondary | ICD-10-CM | POA: Diagnosis not present

## 2023-04-29 DIAGNOSIS — R278 Other lack of coordination: Secondary | ICD-10-CM | POA: Diagnosis not present

## 2023-04-29 NOTE — Therapy (Signed)
OUTPATIENT PHYSICAL THERAPY NEURO TREATMENT/ Physical Therapy Progress Note   Dates of reporting period  03/11/23   to   04/29/23     Patient Name: Johnathan Arnold. MRN: [id number] DOB:02-18-1946, 77 y.o., male Today's Date: 04/29/2023   PCP: [name], MD   REFERRING PROVIDER: [name], MD   END OF SESSION:  PT End of Session - 04/29/23 1025     Visit Number 20    Number of Visits 23    Date for PT Re-Evaluation 05/07/23    Authorization Type Medicare    Progress Note Due on Visit 20    PT Start Time 1016    PT Stop Time 1058    PT Time Calculation (min) 42 min    Equipment Utilized During Treatment Gait belt    Activity Tolerance Patient tolerated treatment well    Behavior During Therapy WFL for tasks assessed/performed                         Past Medical History:  Diagnosis Date   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 LIMA to LAD, SVG to D1, OM1 and RPDA   CORONARY ARTERY BYPASS GRAFT  03/1998   5 vessel, [facility]   RIGHT HEART CATH N/A 04/04/2019   Procedure: RIGHT HEART CATH;  Surgeon: Iran Ouch, MD;  Location: [facility] INVASIVE CV LAB;  Service: Cardiovascular;  Laterality: N/A;   RIGHT/LEFT HEART CATH AND CORONARY ANGIOGRAPHY N/A 02/01/2018   Procedure: RIGHT/LEFT HEART CATH AND CORONARY ANGIOGRAPHY;  Surgeon: Iran Ouch, MD;  Location: [facility] INVASIVE CV LAB;  Service: Cardiovascular;  Laterality: N/A;   Patient Active Problem List   Diagnosis Date Noted   Adverse drug reaction 01/20/2023   History of COVID-19 03/18/2022   History of  diabetes mellitus, type II 03/18/2022   Hospital discharge follow-up 05/16/2021   Generalized weakness 05/07/2021   Fall    Head injury    Bradycardia    Anemia, unspecified 02/07/2021   Neutropenia (HCC) 01/29/2021   Thrombocytopenia (HCC) 01/29/2021   Hyponatremia 12/22/2020   Bilateral leg weakness 12/20/2020   Prostate cancer screening 09/08/2020   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) 09/06/2020   Low back pain 08/19/2019   Pulmonary hypertension (HCC)    Insomnia 12/21/2018   Sleep apnea in adult 12/09/2018   Periodic limb movement disorder 12/09/2018   Pulmonary nodules 05/18/2018   Wears hearing aid in both ears 05/17/2018   Leg pain, bilateral 02/20/2018   Dyspnea    CKD (chronic kidney disease) stage 3, GFR 30-59 ml/min (HCC) 09/21/2017   History of skin cancer in adulthood 08/14/2016   Parkinson's disease (HCC) 08/09/2016   Bilateral carotid artery stenosis 04/02/2015   Vertigo, peripheral 10/17/2014   Benign prostatic hyperplasia with urinary frequency 01/31/2014   Encounter for Medicare annual wellness exam 07/02/2013   Obesity 04/03/2013   Other malaise and fatigue 09/21/2012   Hyperlipidemia    Hypertension    3-vessel coronary artery disease  S/P CABG x 5     ONSET DATE: 01/20/23  REFERRING DIAG: P29.518 (ICD-10-CM) - Weakness of both lower extremities   THERAPY DIAG:  Unsteadiness on feet  Difficulty in walking, not elsewhere classified  Other abnormalities of gait and mobility  Abnormality of gait and mobility  Muscle weakness (generalized)  Rationale for Evaluation and Treatment: Rehabilitation  SUBJECTIVE:                                                                                                                                                                                             SUBJECTIVE STATEMENT:  Pt reported no changes since last visit. Reports today is a "good day". Had some back pain yesterday.  Pt wants to focus more on addressing his strength deficits in future visits.   Pt accompanied by: self  PERTINENT HISTORY: PD, history of COVID and significant weakness present following this diagnosis and the use of paxlovid ( August 5 dx date, thinks Paxlovid may have exacerbated PD symptoms)   Pt reports having COVID and having significant weakness.  Patient reports he also has back pain that is not present for a long time but was exacerbated more recently.  Patient previously did physical therapy for his back and experienced some relief but has not been consistent with the exercises.  Patient also has history of going to Parkinson's rock steady classes multiple times per week prior to onset of his COVID but he has not been back since. Patient previously ambulated without an assistive device but is now ambulating with a straight point cane.  Patient reports increased foot and ankle weakness in comparison with his hips and knees.  Patient also reports low back pain that is exacerbated with prolonged standing. PAIN:  Are you having pain? Yes: NPRS scale: 6-7/10 Pain location: lower back  Pain description: pin when standing upright for prolonged periods  Aggravating factors: standing prolonged periods  Relieving factors: sitting completely alleviates pain   PRECAUTIONS: Fall  RED FLAGS: None   WEIGHT BEARING RESTRICTIONS: No  FALLS: Has patient fallen in last 6 months? Yes. Number of falls 2-3  LIVING ENVIRONMENT: Lives with: lives with their family and lives with their spouse Lives in: House/apartment Stairs: Yes: Internal: 15 steps; on left going up and External: 1 steps; none Has following equipment at home: Quad cane large base  PLOF: Independent  PATIENT GOALS: To improve strength to prior to his COVID diagnosis   OBJECTIVE:   DIAGNOSTIC FINDINGS: n/a  COGNITION: Overall cognitive status: Within functional limits for tasks assessed   SENSATION: Not  tested      POSTURE: rounded  shoulders, forward head, and posterior pelvic tilt   LOWER EXTREMITY MMT:    MMT Right Eval Left Eval Right Eval  Left Eval   Hip flexion 4 4 4+ 4+  Hip extension      Hip abduction 4 4 5 5   Hip adduction 4 4 5 5   Hip internal rotation      Hip external rotation      Knee flexion 4+ 4+ 4+ 4+  Knee extension 4+ 4+ 4+ 4+  Ankle dorsiflexion 4 4 4+ 4+  Ankle plantarflexion      Ankle inversion 4 4 4+ 4+  Ankle eversion 4 4 4+ 4+  (Blank rows = not tested)  BED MOBILITY:  No limitations per pt   TRANSFERS: Assistive device utilized: None  Sit to stand: Complete Independence Stand to sit: Complete Independence Chair to chair: Complete Independence Floor:  Not tested     STAIRS: Level of Assistance: Complete Independence Stair Negotiation Technique: Alternating Pattern  with Bilateral Rails Number of Stairs: 4  Height of Stairs: 6in  Comments:   GAIT: Gait pattern: step through pattern Distance walked: 60 ft Assistive device utilized: None Level of assistance: Complete Independence Comments:   FUNCTIONAL TESTS:  Pt scores 21 / 28 on mini BEST balance test. Scores < 16 indicate increased risk for falls Para March, Dunean, & Yutan) and MCID is 4 (Godi,et al, 2013)    [facility] PT Assessment - 04/29/23 0001       Mini-BESTest   Sit To Stand Normal: Comes to stand without use of hands and stabilizes independently.    Rise to Toes Normal: Stable for 3 s with maximum height.    Stand on one leg (left) Moderate: < 20 s    Stand on one leg (right) Moderate: < 20 s    Stand on one leg - lowest score 1    Compensatory Stepping Correction - Forward Normal: Recovers independently with a single, large step (second realignement is allowed).    Compensatory Stepping Correction - Backward Normal: Recovers independently with a single, large step    Compensatory Stepping Correction - Left Lateral Normal: Recovers independently with 1 step (crossover  or lateral OK)    Compensatory Stepping Correction - Right Lateral Normal: Recovers independently with 1 step (crossover or lateral OK)    Stepping Corredtion Lateral - lowest score 2    Stance - Feet together, eyes open, firm surface  Normal: 30s    Stance - Feet together, eyes closed, foam surface  Normal: 30s    Incline - Eyes Closed Normal: Stands independently 30s and aligns with gravity    Change in Gait Speed Normal: Significantly changes walkling speed without imbalance    Walk with head turns - Horizontal Normal: performs head turns with no change in gait speed and good balance    Walk with pivot turns Normal: Turns with feet close FAST (< 3 steps) with good balance.    Step over obstacles Normal: Able to step over box with minimal change of gait speed and with good balance.    Timed UP & GO with Dual Task Severe: Stops counting while walking OR stops walking while counting.   11.76 nor,al TUG 11/20:18.6 sec   Mini-BEST total score 25              10 Meter Walk Test: Patient instructed to walk 10 meters (32.8 ft) as quickly and as safely as possible at their normal speed x2 Time measured from 2 meter  mark to 8 meter mark to accommodate ramp-up and ramp-down.  Normal speed 1: 1 m/s Normal speed 2: 1.01 m/s Average Normal speed: 1 m/s  Cut off scores: <0.4 m/s = household Ambulator, 0.4-0.8 m/s = limited community Ambulator, >0.8 m/s = community Ambulator, >1.2 m/s = crossing a street, <1.0 = increased fall risk MCID 0.05 m/s (small), 0.13 m/s (moderate) (ANPTA Core Set of Outcome Measures for Adults with Neurologic Conditions, 2018)  Five times Sit to Stand Test (FTSS)  TIME: 11.16 sec  Cut off scores indicative of increased fall risk: >12 sec CVA, >16 sec PD, >13 sec vestibular (ANPTA Core Set of Outcome Measures for Adults with Neurologic Conditions, 2018)   PATIENT SURVEYS:  FOTO: 53  TODAY'S TREATMENT:                                                                                                                                Note: Portions of this document were prepared using Dragon voice recognition software and although reviewed may contain unintentional dictation errors in syntax, grammar, or spelling.  Physical therapy treatment session today consisted of completing assessment of goals and administration of testing as demonstrated and documented in flow sheet, treatment, and goals section of this note. Addition treatments may be found below.   5XSTS 16.2 sec  30 sec chair stand: 10 reps, age norm 26 reps   PATIENT EDUCATION: Education details: POC .  Pt educated throughout session about proper posture and technique with exercises. Improved exercise technique, movement at target joints, use of target muscles after min to mod verbal, visual, tactile cues.  Person educated: Patient Education method: Explanation Education comprehension: verbalized understanding  HOME EXERCISE PROGRAM: Access Code: [facility] URL: [email or web address] Date: 01/30/2023 Prepared by: Thresa Ross  Exercises - Shoulder extension with resistance - Neutral  - 1 x daily - 7 x weekly - 2 sets - 12 reps - Standing Shoulder Row with Anchored Resistance  - 1 x daily - 7 x weekly - 2 sets - 15 reps - Shoulder External Rotation and Scapular Retraction with Resistance  - 1 x daily - 7 x weekly - 2 sets - 10 reps - Seated Shoulder Horizontal Abduction with Resistance - Palms Down  - 1 x daily - 7 x weekly - 2 sets - 12 reps -Standing PWR! Moves x 10 ea (UP, ROCK, TWIST,STEP)  GOALS: Goals reviewed with patient? Yes  SHORT TERM GOALS: Target date: 04/09/2023    Patient will be independent in home exercise program to improve strength/mobility for better functional independence with ADLs. Baseline: No HEP currently  10/2: Was doing them but has not been lately  Goal status:MET     LONG TERM GOALS: Target date: 05/07/2023  1.  Patient will increase  FOTO score to equal to or greater than  62   to demonstrate statistically significant improvement in mobility and quality of life.  Baseline: 53  10/2:52 11/20: 66 Goal  status: MET   2.  Patient will increase Mini BEST Balance score by > 4  points to demonstrate decreased fall risk during functional activities. Baseline: 2111/20: 25  Goal status: MET   3.   Patient will improve LE muscle strength to > 4+/5 in hip, knee and ankle strength in order to indicate return to prior level of LE strength  Baseline: see eval chart  Goal status: MET  4.   Patient will increase six minute walk test distance to >1100 with pain less than 5/10 throughout for progression to community ambulator and improve gait ability without discomfort Baseline: 890 ft in 5 minutes, througohut test but more profoundly at 4 minutes pt began to have stooped kyphotic posture. This worsened and was followed by back pain (6/10) that caused the test to be suspended at 5 minutes.  10/2: 990 ft with similar progression of flexed posture throughout. Pain 5/10 at 5:30  11/20: 750 ft, back pain starts, 600 ft kyphotic posture starts  Goal status: ONGOING  5.  Patient will improve 32nd chair stand to 14 repetitions or greater in order to indicate improved lower extremity strength and power as well as to improve his result to that of his age-matched norms. Baseline: 11/20: 10 Goal status: INITIAL    ASSESSMENT:  CLINICAL IMPRESSION:  Pt presents to PT for progress note this date.  Patient shows good progress both with his balance assessments as well as his focus on therapeutic outcomes survey scale.  Patient still having difficulty with ambulation at prolonged distances and is continuing to experience kyphotic posture which send results and exacerbation of low back pain and ending of test.  Patient reports although his balance has gotten better he still feels as though his legs are weak, physical therapist tests lower extremity  strength and power with 30 second chair stand up 5 Sit to stand and patient does have some limitations compared to age-matched norms and goals were added to further addressed his lower extremity strength and power in future visits.  Patient and this will continue to focus on his postural muscle endurance in hopes to improve his ambulatory capacity without onset of kyphotic posture and low back pain.Patient's condition has the potential to improve in response to therapy. Maximum improvement is yet to be obtained. The anticipated improvement is attainable and reasonable in a generally predictable time.  Pt will continue to benefit from skilled physical therapy intervention to address impairments, improve QOL, and attain therapy goals.    OBJECTIVE IMPAIRMENTS: decreased activity tolerance, decreased balance, decreased mobility, difficulty walking, and decreased strength.   ACTIVITY LIMITATIONS: standing, squatting, stairs, and locomotion level  PARTICIPATION LIMITATIONS: community activity  PERSONAL FACTORS: 3+ comorbidities: PD, HLD, HTN, Back pain  are also affecting patient's functional outcome.   REHAB POTENTIAL: Good  CLINICAL DECISION MAKING: Stable/uncomplicated  EVALUATION COMPLEXITY: Low  PLAN:  PT FREQUENCY: 2x/week  PT DURATION: 6 weeks  PLANNED INTERVENTIONS: Therapeutic exercises, Therapeutic activity, Neuromuscular re-education, Balance training, Gait training, Patient/Family education, Self Care, Joint mobilization, Joint manipulation, Spinal manipulation, Spinal mobilization, Moist heat, and Manual therapy  PLAN FOR NEXT SESSION:  LE strength program- squats, lunges, leg press, weighted heel raises, try to superset with postural endurance and training exercise.   Continue Antigravity extension activities to improve posture with prolonged ambulation, PD specific exercises with balance focus and postural focus.   Norman Herrlich PT ,DPT Physical Therapist- [facility]   12:54 PM 04/29/23

## 2023-05-04 ENCOUNTER — Ambulatory Visit: Payer: Medicare Other

## 2023-05-04 ENCOUNTER — Other Ambulatory Visit: Payer: Self-pay | Admitting: Neurology

## 2023-05-04 DIAGNOSIS — R2681 Unsteadiness on feet: Secondary | ICD-10-CM | POA: Diagnosis not present

## 2023-05-04 DIAGNOSIS — R278 Other lack of coordination: Secondary | ICD-10-CM | POA: Diagnosis not present

## 2023-05-04 DIAGNOSIS — M6281 Muscle weakness (generalized): Secondary | ICD-10-CM

## 2023-05-04 DIAGNOSIS — R2689 Other abnormalities of gait and mobility: Secondary | ICD-10-CM | POA: Diagnosis not present

## 2023-05-04 DIAGNOSIS — R269 Unspecified abnormalities of gait and mobility: Secondary | ICD-10-CM | POA: Diagnosis not present

## 2023-05-04 DIAGNOSIS — R262 Difficulty in walking, not elsewhere classified: Secondary | ICD-10-CM | POA: Diagnosis not present

## 2023-05-04 DIAGNOSIS — G20A1 Parkinson's disease without dyskinesia, without mention of fluctuations: Secondary | ICD-10-CM

## 2023-05-04 NOTE — Therapy (Signed)
OUTPATIENT PHYSICAL THERAPY TREATMENT   Patient Name: Johnathan Arnold. MRN: [id number] DOB:05/15/1946, 77 y.o., male Today's Date: 05/04/2023   PCP: [name], MD   REFERRING PROVIDER: [name], MD   END OF SESSION:  PT End of Session - 05/04/23 1108     Visit Number 21    Number of Visits 23    Date for PT Re-Evaluation 06/24/23    Authorization Type Medicare, BCBS supplement    Progress Note Due on Visit 30    PT Start Time 1103    PT Stop Time 1143    PT Time Calculation (min) 40 min    Activity Tolerance Patient tolerated treatment well;No increased pain    Behavior During Therapy [facility] for tasks assessed/performed             Past Medical History:  Diagnosis Date   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 LIMA to LAD, SVG to D1, OM1 and RPDA   CORONARY ARTERY BYPASS GRAFT  03/1998   5 vessel, [facility]   RIGHT HEART CATH N/A 04/04/2019   Procedure: RIGHT HEART CATH;  Surgeon: Iran Ouch, MD;  Location: [facility] INVASIVE CV LAB;  Service: Cardiovascular;  Laterality: N/A;   RIGHT/LEFT HEART CATH AND CORONARY ANGIOGRAPHY N/A 02/01/2018   Procedure: RIGHT/LEFT HEART CATH AND CORONARY ANGIOGRAPHY;  Surgeon: Iran Ouch, MD;  Location: [facility] INVASIVE CV LAB;  Service: Cardiovascular;  Laterality: N/A;   Patient Active Problem List   Diagnosis Date Noted   Adverse drug reaction 01/20/2023   History of COVID-19 03/18/2022   History of diabetes mellitus, type II 03/18/2022   Hospital discharge follow-up 05/16/2021   Generalized weakness 05/07/2021   Fall    Head injury     Bradycardia    Anemia, unspecified 02/07/2021   Neutropenia (HCC) 01/29/2021   Thrombocytopenia (HCC) 01/29/2021   Hyponatremia 12/22/2020   Bilateral leg weakness 12/20/2020   Prostate cancer screening 09/08/2020   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) 09/06/2020   Low back pain 08/19/2019   Pulmonary hypertension (HCC)    Insomnia 12/21/2018   Sleep apnea in adult 12/09/2018   Periodic limb movement disorder 12/09/2018   Pulmonary nodules 05/18/2018   Wears hearing aid in both ears 05/17/2018   Leg pain, bilateral 02/20/2018   Dyspnea    CKD (chronic kidney disease) stage 3, GFR 30-59 ml/min (HCC) 09/21/2017   History of skin cancer in adulthood 08/14/2016   Parkinson's disease (HCC) 08/09/2016   Bilateral carotid artery stenosis 04/02/2015   Vertigo, peripheral 10/17/2014   Benign prostatic hyperplasia with urinary frequency 01/31/2014   Encounter for Medicare annual wellness exam 07/02/2013   Obesity 04/03/2013   Other malaise and fatigue 09/21/2012   Hyperlipidemia    Hypertension    3-vessel coronary artery disease    S/P CABG x 5     ONSET DATE: 01/20/23  REFERRING DIAG: D66.440 (ICD-10-CM) - Weakness of both lower extremities   THERAPY DIAG:  Unsteadiness on feet  Difficulty in walking, not  elsewhere classified  Other abnormalities of gait and mobility  Abnormality of gait and mobility  Muscle weakness (generalized)  Rationale for Evaluation and Treatment: Rehabilitation  SUBJECTIVE:                                                                                                                                                                                             SUBJECTIVE STATEMENT:  Pt reported no changes since last visit. No pain today.   Pt accompanied by: self  PERTINENT HISTORY: PD, history of COVID and significant weakness present following this diagnosis and the use of paxlovid ( August 5 dx date, thinks  Paxlovid may have exacerbated PD symptoms)   Pt reports having COVID and having significant weakness.  Patient reports he also has back pain that is not present for a long time but was exacerbated more recently.  Patient previously did physical therapy for his back and experienced some relief but has not been consistent with the exercises.  Patient also has history of going to Parkinson's rock steady classes multiple times per week prior to onset of his COVID but he has not been back since. Patient previously ambulated without an assistive device but is now ambulating with a straight point cane.  Patient reports increased foot and ankle weakness in comparison with his hips and knees.  Patient also reports low back pain that is exacerbated with prolonged standing. PAIN:  Are you having pain? No pain   PRECAUTIONS: Fall  RED FLAGS: None   WEIGHT BEARING RESTRICTIONS: No  FALLS: Has patient fallen in last 6 months? Yes. Number of falls 2-3  LIVING ENVIRONMENT: Lives with: lives with their family and lives with their spouse Lives in: House/apartment Stairs: Yes: Internal: 15 steps; on left going up and External: 1 steps; none Has following equipment at home: Quad cane large base  PLOF: Independent  PATIENT GOALS: To improve strength to prior to his COVID diagnosis   OBJECTIVE:   TODAY'S TREATMENT:  05/04/23  Over to the [facility] Leg press 4 plates O96 -Hoist mid-row 2 plates E95  -Hoist Chest Press 1 plate [id number]  -Hoist Leg press 4 plates L24 (-Hoist mid-row 2 plates M01 ) occupado -Hoist Chest Press 1 plate [id number]  Back to rehab gym -seated cable row 12.5lb 1x15 -STS from chair + airex BOOSTER x10  -seated cable lat pulldown 12.5lb on each side 1x15  -hooklying marching c 5lb AW bilat 1x15 -hooklying clam GTB 1x12 -hooklying marching c 5lb AW bilat  1x15 -hooklying clam GTB 1x12    PATIENT EDUCATION: Education details: POC .  Pt educated throughout session about proper posture and technique with exercises. Improved exercise technique, movement at target joints, use of target muscles after min to mod verbal, visual, tactile cues.  Person educated: Patient Education method: Explanation Education comprehension: verbalized understanding  HOME EXERCISE PROGRAM: Access Code: [facility] URL: [email or web address] Date: 01/30/2023 Prepared by: Thresa Ross  Exercises - Shoulder extension with resistance - Neutral  - 1 x daily - 7 x weekly - 2 sets - 12 reps - Standing Shoulder Row with Anchored Resistance  - 1 x daily - 7 x weekly - 2 sets - 15 reps - Shoulder External Rotation and Scapular Retraction with Resistance  - 1 x daily - 7 x weekly - 2 sets - 10 reps - Seated Shoulder Horizontal Abduction with Resistance - Palms Down  - 1 x daily - 7 x weekly - 2 sets - 12 reps -Standing PWR! Moves x 10 ea (UP, ROCK, TWIST,STEP)  GOALS: Goals reviewed with patient? Yes  SHORT TERM GOALS: Target date: 04/09/2023    Patient will be independent in home exercise program to improve strength/mobility for better functional independence with ADLs. Baseline: No HEP currently  10/2: Was doing them but has not been lately  Goal status:MET     LONG TERM GOALS: Target date: 05/07/2023  1.  Patient will increase FOTO score to equal to or greater than  62   to demonstrate statistically significant improvement in mobility and quality of life.  Baseline: 53  10/2:52 11/20: 66 Goal status: MET   2.  Patient will increase Mini BEST Balance score by > 4  points to demonstrate decreased fall risk during functional activities. Baseline: 2111/20: 25  Goal status: MET   3.   Patient will improve LE muscle strength to > 4+/5 in hip, knee and ankle strength in order to indicate return to prior level of LE strength  Baseline: see  eval chart  Goal status: MET  4.   Patient will increase six minute walk test distance to >1100 with pain less than 5/10 throughout for progression to community ambulator and improve gait ability without discomfort Baseline: 890 ft in 5 minutes, througohut test but more profoundly at 4 minutes pt began to have stooped kyphotic posture. This worsened and was followed by back pain (6/10) that caused the test to be suspended at 5 minutes.  10/2: 990 ft with similar progression of flexed posture throughout. Pain 5/10 at 5:30  11/20: 750 ft, back pain starts, 600 ft kyphotic posture starts  Goal status: ONGOING  5.  Patient will improve 32nd chair stand to 14 repetitions or greater in order to indicate improved lower extremity strength and power as well as to improve his result to that of his age-matched norms. Baseline: 11/20: 10 Goal status: INITIAL    ASSESSMENT:  CLINICAL IMPRESSION:   Patient's condition has the potential to improve in  response to therapy. Maximum improvement is yet to be obtained. The anticipated improvement is attainable and reasonable in a generally predictable time.  Pt will continue to benefit from skilled physical therapy intervention to address impairments, improve QOL, and attain therapy goals.    OBJECTIVE IMPAIRMENTS: decreased activity tolerance, decreased balance, decreased mobility, difficulty walking, and decreased strength.   ACTIVITY LIMITATIONS: standing, squatting, stairs, and locomotion level  PARTICIPATION LIMITATIONS: community activity  PERSONAL FACTORS: 3+ comorbidities: PD, HLD, HTN, Back pain  are also affecting patient's functional outcome.   REHAB POTENTIAL: Good  CLINICAL DECISION MAKING: Stable/uncomplicated  EVALUATION COMPLEXITY: Low  PLAN:  PT FREQUENCY: 2x/week  PT DURATION: 6 weeks  PLANNED INTERVENTIONS: Therapeutic exercises, Therapeutic activity, Neuromuscular re-education, Balance training, Gait training,  Patient/Family education, Self Care, Joint mobilization, Joint manipulation, Spinal manipulation, Spinal mobilization, Moist heat, and Manual therapy  PLAN FOR NEXT SESSION:  LE strength program- squats, lunges, leg press, weighted heel raises, try to superset with postural endurance and training exercise.   Continue Antigravity extension activities to improve posture with prolonged ambulation, PD specific exercises with balance focus and postural focus.   Rosamaria Lints PT ,DPT Physical Therapist- [facility]   11:09 AM 05/04/23

## 2023-05-06 ENCOUNTER — Ambulatory Visit: Payer: Medicare Other | Admitting: Physical Therapy

## 2023-05-06 DIAGNOSIS — R2689 Other abnormalities of gait and mobility: Secondary | ICD-10-CM | POA: Diagnosis not present

## 2023-05-06 DIAGNOSIS — R2681 Unsteadiness on feet: Secondary | ICD-10-CM

## 2023-05-06 DIAGNOSIS — R262 Difficulty in walking, not elsewhere classified: Secondary | ICD-10-CM | POA: Diagnosis not present

## 2023-05-06 DIAGNOSIS — R29898 Other symptoms and signs involving the musculoskeletal system: Secondary | ICD-10-CM

## 2023-05-06 DIAGNOSIS — R278 Other lack of coordination: Secondary | ICD-10-CM | POA: Diagnosis not present

## 2023-05-06 DIAGNOSIS — M5459 Other low back pain: Secondary | ICD-10-CM

## 2023-05-06 DIAGNOSIS — M6281 Muscle weakness (generalized): Secondary | ICD-10-CM | POA: Diagnosis not present

## 2023-05-06 DIAGNOSIS — R269 Unspecified abnormalities of gait and mobility: Secondary | ICD-10-CM

## 2023-05-06 NOTE — Therapy (Signed)
OUTPATIENT PHYSICAL THERAPY TREATMENT   Patient Name: Johnathan Arnold. MRN: [id number] DOB:07-11-1945, 77 y.o., male Today's Date: 05/06/2023   PCP: [name], MD   REFERRING PROVIDER: [name], MD   END OF SESSION:  PT End of Session - 05/06/23 1100     Visit Number 22    Number of Visits 35    Date for PT Re-Evaluation 06/24/23    Authorization Type Medicare, BCBS supplement    Progress Note Due on Visit 30    PT Start Time 1104    PT Stop Time 1145    PT Time Calculation (min) 41 min    Activity Tolerance Patient tolerated treatment well;No increased pain    Behavior During Therapy [facility] for tasks assessed/performed             Past Medical History:  Diagnosis Date   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 LIMA to LAD, SVG to D1, OM1 and RPDA   CORONARY ARTERY BYPASS GRAFT  03/1998   5 vessel, [facility]   RIGHT HEART CATH N/A 04/04/2019   Procedure: RIGHT HEART CATH;  Surgeon: Iran Ouch, MD;  Location: [facility] INVASIVE CV LAB;  Service: Cardiovascular;  Laterality: N/A;   RIGHT/LEFT HEART CATH AND CORONARY ANGIOGRAPHY N/A 02/01/2018   Procedure: RIGHT/LEFT HEART CATH AND CORONARY ANGIOGRAPHY;  Surgeon: Iran Ouch, MD;  Location: [facility] INVASIVE CV LAB;  Service: Cardiovascular;  Laterality: N/A;   Patient Active Problem List   Diagnosis Date Noted   Adverse drug reaction 01/20/2023   History of COVID-19 03/18/2022   History of diabetes mellitus, type II 03/18/2022   Hospital discharge follow-up 05/16/2021   Generalized weakness 05/07/2021   Fall    Head injury     Bradycardia    Anemia, unspecified 02/07/2021   Neutropenia (HCC) 01/29/2021   Thrombocytopenia (HCC) 01/29/2021   Hyponatremia 12/22/2020   Bilateral leg weakness 12/20/2020   Prostate cancer screening 09/08/2020   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) 09/06/2020   Low back pain 08/19/2019   Pulmonary hypertension (HCC)    Insomnia 12/21/2018   Sleep apnea in adult 12/09/2018   Periodic limb movement disorder 12/09/2018   Pulmonary nodules 05/18/2018   Wears hearing aid in both ears 05/17/2018   Leg pain, bilateral 02/20/2018   Dyspnea    CKD (chronic kidney disease) stage 3, GFR 30-59 ml/min (HCC) 09/21/2017   History of skin cancer in adulthood 08/14/2016   Parkinson's disease (HCC) 08/09/2016   Bilateral carotid artery stenosis 04/02/2015   Vertigo, peripheral 10/17/2014   Benign prostatic hyperplasia with urinary frequency 01/31/2014   Encounter for Medicare annual wellness exam 07/02/2013   Obesity 04/03/2013   Other malaise and fatigue 09/21/2012   Hyperlipidemia    Hypertension    3-vessel coronary artery disease    S/P CABG x 5     ONSET DATE: 01/20/23  REFERRING DIAG: E33.295 (ICD-10-CM) - Weakness of both lower extremities   THERAPY DIAG:  Unsteadiness on feet  Difficulty in walking, not  elsewhere classified  Other abnormalities of gait and mobility  Abnormality of gait and mobility  Muscle weakness (generalized)  Other lack of coordination  Other low back pain  Weakness of both hips  Rationale for Evaluation and Treatment: Rehabilitation  SUBJECTIVE:                                                                                                                                                                                             SUBJECTIVE STATEMENT:  Pt reported some back soreness after last PT session with increased focus on strength training in well zone. Pain/soreness resolved and reports no pain at start PT  session.   Pt accompanied by: self  PERTINENT HISTORY: PD, history of COVID and significant weakness present following this diagnosis and the use of paxlovid ( August 5 dx date, thinks Paxlovid may have exacerbated PD symptoms)   Pt reports having COVID and having significant weakness.  Patient reports he also has back pain that is not present for a long time but was exacerbated more recently.  Patient previously did physical therapy for his back and experienced some relief but has not been consistent with the exercises.  Patient also has history of going to Parkinson's rock steady classes multiple times per week prior to onset of his COVID but he has not been back since. Patient previously ambulated without an assistive device but is now ambulating with a straight point cane.  Patient reports increased foot and ankle weakness in comparison with his hips and knees.  Patient also reports low back pain that is exacerbated with prolonged standing. PAIN:  Are you having pain? No pain   PRECAUTIONS: Fall  RED FLAGS: None   WEIGHT BEARING RESTRICTIONS: No  FALLS: Has patient fallen in last 6 months? Yes. Number of falls 2-3  LIVING ENVIRONMENT: Lives with: lives with their family and lives with their spouse Lives in: House/apartment Stairs: Yes: Internal: 15 steps; on left going up and External: 1 steps; none Has following equipment at home: Quad cane large base  PLOF: Independent  PATIENT GOALS: To improve strength to prior to his COVID diagnosis   OBJECTIVE:   TODAY'S TREATMENT:  05/06/23  Octane level 4 x 6 min for aerobic priming and for UE and LE reciprocal large movement initiation   Donned 5# AW remained in place for entire treatment session.  Forward lunge over bolster x 8 bil UE supported on parallel bars for pec stretch Lateral lunge over 2 bolsters x  8 bil UE supported on parallel bars fo rsafety.   Reciprocal foot tap on 8 inch step x 12 bil no Ue support Step up 8 inch step x 8 bil light UE support for parallel bars   Standing lat pull down 16# x 10; 24# x 10 bil  Standing row 12 # x10 x 2   Forward Step with over head ball toss x 8 bil performed in parallel bars with 3 LOB, requiring UE support for balance to prevent fall.    With with 5# AW 2 x 340ft. With cues for reciprocal arm swing noted to have increased reciprocal arm swing on second bout.  PATIENT EDUCATION: Education details: POC .  Pt educated throughout session about proper posture and technique with exercises. Improved exercise technique, movement at target joints, use of target muscles after min to mod verbal, visual, tactile cues.  Person educated: Patient Education method: Explanation Education comprehension: verbalized understanding  HOME EXERCISE PROGRAM: Access Code: [facility] URL: [email or web address] Date: 01/30/2023 Prepared by: Thresa Ross  Exercises - Shoulder extension with resistance - Neutral  - 1 x daily - 7 x weekly - 2 sets - 12 reps - Standing Shoulder Row with Anchored Resistance  - 1 x daily - 7 x weekly - 2 sets - 15 reps - Shoulder External Rotation and Scapular Retraction with Resistance  - 1 x daily - 7 x weekly - 2 sets - 10 reps - Seated Shoulder Horizontal Abduction with Resistance - Palms Down  - 1 x daily - 7 x weekly - 2 sets - 12 reps -Standing PWR! Moves x 10 ea (UP, ROCK, TWIST,STEP)  GOALS: Goals reviewed with patient? Yes  SHORT TERM GOALS: Target date: 04/09/2023    Patient will be independent in home exercise program to improve strength/mobility for better functional independence with ADLs. Baseline: No HEP currently  10/2: Was doing them but has not been lately  Goal status:MET     LONG TERM GOALS: Target date: 05/07/2023  1.  Patient will increase FOTO score to equal to or greater than  62   to  demonstrate statistically significant improvement in mobility and quality of life.  Baseline: 53  10/2:52 11/20: 66 Goal status: MET   2.  Patient will increase Mini BEST Balance score by > 4  points to demonstrate decreased fall risk during functional activities. Baseline: 2111/20: 25  Goal status: MET   3.   Patient will improve LE muscle strength to > 4+/5 in hip, knee and ankle strength in order to indicate return to prior level of LE strength  Baseline: see eval chart  Goal status: MET  4.   Patient will increase six minute walk test distance to >1100 with pain less than 5/10 throughout for progression to community ambulator and improve gait ability without discomfort Baseline: 890 ft in 5 minutes, througohut test but more profoundly at 4 minutes pt began to have stooped kyphotic posture. This worsened and was followed by back pain (6/10) that caused the test to be suspended at 5 minutes.  10/2: 990 ft with similar progression of flexed posture throughout. Pain 5/10 at 5:30  11/20: 750 ft, back pain starts,  600 ft kyphotic posture starts  Goal status: ONGOING  5.  Patient will improve 32nd chair stand to 14 repetitions or greater in order to indicate improved lower extremity strength and power as well as to improve his result to that of his age-matched norms. Baseline: 11/20: 10 Goal status: INITIAL    ASSESSMENT:  CLINICAL IMPRESSION:   PT treatment focused on large amplitude movements and increased trunkal opening as well as weighted gait with 5# AW on BLE throughout session. Pt demonstrates improved step length and righting reactions with LOB intermittently throughout session. Tactile cues for increased scapular retraction with pull down and row. Pt will continue to benefit from skilled physical therapy intervention to address impairments, improve QOL, and attain therapy goals.    OBJECTIVE IMPAIRMENTS: decreased activity tolerance, decreased balance, decreased mobility,  difficulty walking, and decreased strength.   ACTIVITY LIMITATIONS: standing, squatting, stairs, and locomotion level  PARTICIPATION LIMITATIONS: community activity  PERSONAL FACTORS: 3+ comorbidities: PD, HLD, HTN, Back pain  are also affecting patient's functional outcome.   REHAB POTENTIAL: Good  CLINICAL DECISION MAKING: Stable/uncomplicated  EVALUATION COMPLEXITY: Low  PLAN:  PT FREQUENCY: 2x/week  PT DURATION: 6 weeks  PLANNED INTERVENTIONS: Therapeutic exercises, Therapeutic activity, Neuromuscular re-education, Balance training, Gait training, Patient/Family education, Self Care, Joint mobilization, Joint manipulation, Spinal manipulation, Spinal mobilization, Moist heat, and Manual therapy  PLAN FOR NEXT SESSION:  LE strength program- squats, lunges, leg press, weighted heel raises, try to superset with postural endurance and training exercise.   Continue Antigravity extension activities to improve posture with prolonged ambulation, PD specific exercises with balance focus and postural focus.   Golden Pop PT ,DPT Physical Therapist- [facility]   11:01 AM 05/06/23

## 2023-05-09 DIAGNOSIS — Z03818 Encounter for observation for suspected exposure to other biological agents ruled out: Secondary | ICD-10-CM | POA: Diagnosis not present

## 2023-05-09 DIAGNOSIS — J069 Acute upper respiratory infection, unspecified: Secondary | ICD-10-CM | POA: Diagnosis not present

## 2023-05-11 ENCOUNTER — Ambulatory Visit: Payer: Medicare Other | Attending: Internal Medicine | Admitting: Physical Therapy

## 2023-05-11 ENCOUNTER — Encounter: Payer: Self-pay | Admitting: Physical Therapy

## 2023-05-11 DIAGNOSIS — R2689 Other abnormalities of gait and mobility: Secondary | ICD-10-CM | POA: Insufficient documentation

## 2023-05-11 DIAGNOSIS — R2681 Unsteadiness on feet: Secondary | ICD-10-CM | POA: Insufficient documentation

## 2023-05-11 DIAGNOSIS — R262 Difficulty in walking, not elsewhere classified: Secondary | ICD-10-CM | POA: Insufficient documentation

## 2023-05-11 DIAGNOSIS — M5459 Other low back pain: Secondary | ICD-10-CM | POA: Insufficient documentation

## 2023-05-11 DIAGNOSIS — R278 Other lack of coordination: Secondary | ICD-10-CM | POA: Diagnosis not present

## 2023-05-11 DIAGNOSIS — R269 Unspecified abnormalities of gait and mobility: Secondary | ICD-10-CM | POA: Insufficient documentation

## 2023-05-11 DIAGNOSIS — R29898 Other symptoms and signs involving the musculoskeletal system: Secondary | ICD-10-CM | POA: Insufficient documentation

## 2023-05-11 DIAGNOSIS — M6281 Muscle weakness (generalized): Secondary | ICD-10-CM | POA: Diagnosis not present

## 2023-05-11 NOTE — Therapy (Signed)
OUTPATIENT PHYSICAL THERAPY TREATMENT   Patient Name: Johnathan Arnold. MRN: [id number] DOB:[birth year], 77 y.o., male Today's Date: 05/11/2023   PCP: [name], MD   REFERRING PROVIDER: [name], MD   END OF SESSION:  PT End of Session - 05/11/23 1007     Visit Number 23    Number of Visits 35    Date for PT Re-Evaluation 06/24/23    Authorization Type Medicare, BCBS supplement    Progress Note Due on Visit 30    PT Start Time 1015    PT Stop Time 1058    PT Time Calculation (min) 43 min    Activity Tolerance Patient tolerated treatment well;No increased pain    Behavior During Therapy [facility] for tasks assessed/performed              Past Medical History:  Diagnosis Date   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 LIMA to LAD, SVG to D1, OM1 and RPDA   CORONARY ARTERY BYPASS GRAFT  03/1998   5 vessel, [facility]   RIGHT HEART CATH N/A 04/04/2019   Procedure: RIGHT HEART CATH;  Surgeon: Iran Ouch, MD;  Location: [facility] INVASIVE CV LAB;  Service: Cardiovascular;  Laterality: N/A;   RIGHT/LEFT HEART CATH AND CORONARY ANGIOGRAPHY N/A 02/01/2018   Procedure: RIGHT/LEFT HEART CATH AND CORONARY ANGIOGRAPHY;  Surgeon: Iran Ouch, MD;  Location: [facility] INVASIVE CV LAB;  Service: Cardiovascular;  Laterality: N/A;   Patient Active Problem List   Diagnosis Date Noted   Adverse drug reaction 01/20/2023   History of COVID-19 03/18/2022   History of diabetes mellitus, type II 03/18/2022   Hospital discharge follow-up 05/16/2021   Generalized weakness 05/07/2021   Fall    Head  injury    Bradycardia    Anemia, unspecified 02/07/2021   Neutropenia (HCC) 01/29/2021   Thrombocytopenia (HCC) 01/29/2021   Hyponatremia 12/22/2020   Bilateral leg weakness 12/20/2020   Prostate cancer screening 09/08/2020   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) 09/06/2020   Low back pain 08/19/2019   Pulmonary hypertension (HCC)    Insomnia 12/21/2018   Sleep apnea in adult 12/09/2018   Periodic limb movement disorder 12/09/2018   Pulmonary nodules 05/18/2018   Wears hearing aid in both ears 05/17/2018   Leg pain, bilateral 02/20/2018   Dyspnea    CKD (chronic kidney disease) stage 3, GFR 30-59 ml/min (HCC) 09/21/2017   History of skin cancer in adulthood 08/14/2016   Parkinson's disease (HCC) 08/09/2016   Bilateral carotid artery stenosis 04/02/2015   Vertigo, peripheral 10/17/2014   Benign prostatic hyperplasia with urinary frequency 01/31/2014   Encounter for Medicare annual wellness exam 07/02/2013   Obesity 04/03/2013   Other malaise and fatigue 09/21/2012   Hyperlipidemia    Hypertension    3-vessel coronary artery disease    S/P CABG x 5     ONSET DATE: 01/20/23  REFERRING DIAG: Q65.784 (ICD-10-CM) - Weakness of both lower extremities   THERAPY DIAG:  Unsteadiness on feet  Difficulty in walking,  not elsewhere classified  Other abnormalities of gait and mobility  Abnormality of gait and mobility  Muscle weakness (generalized)  Rationale for Evaluation and Treatment: Rehabilitation  SUBJECTIVE:                                                                                                                                                                                             SUBJECTIVE STATEMENT:  Pt reports doing well today. Pt denies any recent falls/stumbles since prior session. Pt denies any updates to medications or medical appointment since prior session. Pt reports good compliance with HEP when time permits.   Pt  accompanied by: self  PERTINENT HISTORY: PD, history of COVID and significant weakness present following this diagnosis and the use of paxlovid ( August 5 dx date, thinks Paxlovid may have exacerbated PD symptoms)   Pt reports having COVID and having significant weakness.  Patient reports he also has back pain that is not present for a long time but was exacerbated more recently.  Patient previously did physical therapy for his back and experienced some relief but has not been consistent with the exercises.  Patient also has history of going to Parkinson's rock steady classes multiple times per week prior to onset of his COVID but he has not been back since. Patient previously ambulated without an assistive device but is now ambulating with a straight point cane.  Patient reports increased foot and ankle weakness in comparison with his hips and knees.  Patient also reports low back pain that is exacerbated with prolonged standing. PAIN:  Are you having pain? No pain   PRECAUTIONS: Fall  RED FLAGS: None   WEIGHT BEARING RESTRICTIONS: No  FALLS: Has patient fallen in last 6 months? Yes. Number of falls 2-3  LIVING ENVIRONMENT: Lives with: lives with their family and lives with their spouse Lives in: House/apartment Stairs: Yes: Internal: 15 steps; on left going up and External: 1 steps; none Has following equipment at home: Quad cane large base  PLOF: Independent  PATIENT GOALS: To improve strength to prior to his COVID diagnosis   OBJECTIVE:   TODAY'S TREATMENT:  05/11/23  [place] level 5 x 6 min for aerobic priming and for UE and LE reciprocal large movement initiation   Leg press 3 x 10 reps @ 55# ( precor leg press), pt reports hard   Donned 5# AW remained in place for entire treatment session.  LAQ x 10 reps ea LE with 3 sec hold Immediate 450 ft walk,  cues for picking up feet and maintaining posture  LAQ x 10 ea LE  Forward lunge over bolster x 10 bil UE supported  Lateral lunge over hurdle 10 bil UE supported on parallel bars fo rsafety.   Seated PWR! Up with GTB for resistance with HABD   With with 5# AW  x 457ft. Fatigue in [place] noted, provided rest at end of session prior to pt walking out.   PATIENT EDUCATION: Education details: POC .  Pt educated throughout session about proper posture and technique with exercises. Improved exercise technique, movement at target joints, use of target muscles after min to mod verbal, visual, tactile cues.  Person educated: Patient Education method: Explanation Education comprehension: verbalized understanding  HOME EXERCISE PROGRAM: Access Code: [facility] URL: [email or web address] Date: 01/30/2023 Prepared by: Thresa Ross  Exercises - Shoulder extension with resistance - Neutral  - 1 x daily - 7 x weekly - 2 sets - 12 reps - Standing Shoulder Row with Anchored Resistance  - 1 x daily - 7 x weekly - 2 sets - 15 reps - Shoulder External Rotation and Scapular Retraction with Resistance  - 1 x daily - 7 x weekly - 2 sets - 10 reps - Seated Shoulder Horizontal Abduction with Resistance - Palms Down  - 1 x daily - 7 x weekly - 2 sets - 12 reps -Standing PWR! Moves x 10 ea (UP, ROCK, TWIST,STEP)  GOALS: Goals reviewed with patient? Yes  SHORT TERM GOALS: Target date: 04/09/2023    Patient will be independent in home exercise program to improve strength/mobility for better functional independence with ADLs. Baseline: No HEP currently  10/2: Was doing them but has not been lately  Goal status:MET     LONG TERM GOALS: Target date: 05/07/2023  1.  Patient will increase FOTO score to equal to or greater than  62   to demonstrate statistically significant improvement in mobility and quality of life.  Baseline: 53  10/2:52 11/20: 66 Goal status: MET   2.  Patient will  increase Mini BEST Balance score by > 4  points to demonstrate decreased fall risk during functional activities. Baseline: 2111/20: 25  Goal status: MET   3.   Patient will improve LE muscle strength to > 4+/5 in hip, knee and ankle strength in order to indicate return to prior level of LE strength  Baseline: see eval chart  Goal status: MET  4.   Patient will increase six minute walk test distance to >1100 with pain less than 5/10 throughout for progression to community ambulator and improve gait ability without discomfort Baseline: 890 ft in 5 minutes, througohut test but more profoundly at 4 minutes pt began to have stooped kyphotic posture. This worsened and was followed by back pain (6/10) that caused the test to be suspended at 5 minutes.  10/2: 990 ft with similar progression of flexed posture throughout. Pain 5/10 at 5:30  11/20: 750 ft, back pain starts, 600 ft kyphotic posture starts  Goal status: ONGOING  5.  Patient will improve 32nd chair stand to 14 repetitions or greater in order to indicate improved  lower extremity strength and power as well as to improve his result to that of his age-matched norms. Baseline: 11/20: 10 Goal status: INITIAL    ASSESSMENT:  CLINICAL IMPRESSION:   PT treatment focused on LE strength and postural extension activities for his kyphotic posture from PD. Pt progressed with ambulation distance with AW and more LE specific exercises to work on his leg weakness. Pt will continue to benefit from skilled physical therapy intervention to address impairments, improve QOL, and attain therapy goals.    OBJECTIVE IMPAIRMENTS: decreased activity tolerance, decreased balance, decreased mobility, difficulty walking, and decreased strength.   ACTIVITY LIMITATIONS: standing, squatting, stairs, and locomotion level  PARTICIPATION LIMITATIONS: community activity  PERSONAL FACTORS: 3+ comorbidities: PD, HLD, HTN, Back pain  are also affecting patient's  functional outcome.   REHAB POTENTIAL: Good  CLINICAL DECISION MAKING: Stable/uncomplicated  EVALUATION COMPLEXITY: Low  PLAN:  PT FREQUENCY: 2x/week  PT DURATION: 6 weeks  PLANNED INTERVENTIONS: Therapeutic exercises, Therapeutic activity, Neuromuscular re-education, Balance training, Gait training, Patient/Family education, Self Care, Joint mobilization, Joint manipulation, Spinal manipulation, Spinal mobilization, Moist heat, and Manual therapy  PLAN FOR NEXT SESSION:  LE strength program- squats, lunges, leg press, weighted heel raises, try to superset with postural endurance and training exercise.   Continue Antigravity extension activities to improve posture with prolonged ambulation, PD specific exercises with balance focus and postural focus.   Norman Herrlich PT ,DPT Physical Therapist- [facility]   10:08 AM 05/11/23

## 2023-05-13 ENCOUNTER — Ambulatory Visit: Payer: Medicare Other | Admitting: Physical Therapy

## 2023-05-18 ENCOUNTER — Ambulatory Visit: Payer: Medicare Other | Admitting: Physical Therapy

## 2023-05-20 ENCOUNTER — Ambulatory Visit: Payer: Medicare Other | Admitting: Physical Therapy

## 2023-05-22 ENCOUNTER — Emergency Department
Admission: EM | Admit: 2023-05-22 | Discharge: 2023-05-22 | Discharge status: Against Medical Advice | Payer: Medicare Other | Attending: Emergency Medicine | Admitting: Emergency Medicine

## 2023-05-22 ENCOUNTER — Other Ambulatory Visit: Payer: Self-pay

## 2023-05-22 ENCOUNTER — Encounter: Payer: Self-pay | Admitting: Internal Medicine

## 2023-05-22 ENCOUNTER — Ambulatory Visit (INDEPENDENT_AMBULATORY_CARE_PROVIDER_SITE_OTHER): Payer: Medicare Other | Admitting: Internal Medicine

## 2023-05-22 VITALS — BP 118/56 | HR 60 | Ht 67.0 in | Wt 161.4 lb

## 2023-05-22 DIAGNOSIS — Z5321 Procedure and treatment not carried out due to patient leaving prior to being seen by health care provider: Secondary | ICD-10-CM | POA: Diagnosis not present

## 2023-05-22 DIAGNOSIS — I951 Orthostatic hypotension: Secondary | ICD-10-CM | POA: Insufficient documentation

## 2023-05-22 DIAGNOSIS — R82998 Other abnormal findings in urine: Secondary | ICD-10-CM | POA: Diagnosis not present

## 2023-05-22 DIAGNOSIS — I959 Hypotension, unspecified: Secondary | ICD-10-CM | POA: Diagnosis not present

## 2023-05-22 DIAGNOSIS — R55 Syncope and collapse: Secondary | ICD-10-CM | POA: Insufficient documentation

## 2023-05-22 DIAGNOSIS — E860 Dehydration: Secondary | ICD-10-CM | POA: Insufficient documentation

## 2023-05-22 DIAGNOSIS — E7800 Pure hypercholesterolemia, unspecified: Secondary | ICD-10-CM | POA: Diagnosis not present

## 2023-05-22 DIAGNOSIS — E1121 Type 2 diabetes mellitus with diabetic nephropathy: Secondary | ICD-10-CM | POA: Diagnosis not present

## 2023-05-22 DIAGNOSIS — I10 Essential (primary) hypertension: Secondary | ICD-10-CM

## 2023-05-22 LAB — CBC
HCT: 32.8 % — ABNORMAL LOW (ref 39.0–52.0)
Hemoglobin: 11.3 g/dL — ABNORMAL LOW (ref 13.0–17.0)
MCH: 34.1 pg — ABNORMAL HIGH (ref 26.0–34.0)
MCHC: 34.5 g/dL (ref 30.0–36.0)
MCV: 99.1 fL (ref 80.0–100.0)
Platelets: 189 10*3/uL (ref 150–400)
RBC: 3.31 MIL/uL — ABNORMAL LOW (ref 4.22–5.81)
RDW: 12.2 % (ref 11.5–15.5)
WBC: 5.2 10*3/uL (ref 4.0–10.5)
nRBC: 0 % (ref 0.0–0.2)

## 2023-05-22 LAB — BASIC METABOLIC PANEL
Anion gap: 8 (ref 5–15)
BUN: 20 mg/dL (ref 8–23)
CO2: 25 mmol/L (ref 22–32)
Calcium: 8.8 mg/dL — ABNORMAL LOW (ref 8.9–10.3)
Chloride: 103 mmol/L (ref 98–111)
Creatinine, Ser: 1.85 mg/dL — ABNORMAL HIGH (ref 0.61–1.24)
GFR, Estimated: 37 mL/min — ABNORMAL LOW (ref >60)
Glucose, Bld: 144 mg/dL — ABNORMAL HIGH (ref 70–99)
Potassium: 4.8 mmol/L (ref 3.5–5.1)
Sodium: 136 mmol/L (ref 135–145)

## 2023-05-22 MED ORDER — OMEPRAZOLE 20 MG PO CPDR: 20.0000 mg | DELAYED_RELEASE_CAPSULE | Freq: Every morning | ORAL | 3 refills | Status: DC

## 2023-05-22 MED ORDER — ONDANSETRON HCL 4 MG PO TABS: 4.0000 mg | ORAL_TABLET | Freq: Three times a day (TID) | ORAL | 0 refills | Status: DC | PRN

## 2023-05-22 NOTE — Progress Notes (Signed)
Subjective:  Patient ID: Johnathan Mintzer., male    DOB: Nov 17, 1945  Age: 77 y.o. MRN: [id number]  CC: The primary encounter diagnosis was Primary hypertension. 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   HPI Johnathan Arnold. presents for  Chief Complaint  Patient presents with   Medical Management of Chronic Issues   Gastroenteritis : Johnathan Arnold is a 77 yr old male with CAD, type 2 DM  CKD and Parkinson's disease who presents today with a syncopal episode witnessed during my exam.  He presented with his  wife after being ill for the past week.  His entire family has been ill after son visited from [facility] and brought it with him.  Patient HAS BEEN feeling poorly for the past week   He had one day of vomiting  last week followed by several days of  loose stools  which resolved 4 days ago with use of  imodium.  NO BM in 4 days   urine output is dimished and urine is ""brown  "  unable to void for [place] today . Has been uanable to get up without assistance from floor.  Marland Kitchen   Appetite is currently diminished  eating soup,  drinking gatorade .     Patient stood up to go to lab,  became syncopal and fell back into his chair.          Outpatient Medications Prior to Visit  Medication Sig Dispense Refill   acetaminophen (TYLENOL) 500 MG tablet Take 500 mg by mouth as needed.     aspirin EC 81 MG tablet Take 1 tablet (81 mg total) by mouth at bedtime. 30 tablet 11   carbidopa-levodopa (SINEMET CR) 50-200 MG tablet TAKE 1 TABLET BY MOUTH AT BEDTIME 90 tablet 0   carbidopa-levodopa (SINEMET IR) 25-100 MG tablet Take 2 at 8am, 2 at 11am, 2 at 2 pm, 1 at 5pm 630 tablet 0   clonazePAM (KLONOPIN) 1 MG tablet Take 1 tablet (1 mg total) by mouth at bedtime. 90 tablet 1   diazepam (VALIUM) 5 MG tablet Take 5 mg by mouth 3 (three) times daily as  needed.     furosemide (LASIX) 20 MG tablet Take 1 tablet (20 mg total) by mouth daily as needed (for swelling). 90 tablet 3   isosorbide mononitrate (IMDUR) 60 MG 24 hr tablet TAKE 1 TABLET BY MOUTH DAILY 90 tablet 2   losartan (COZAAR) 25 MG tablet Take 1 tablet (25 mg total) by mouth daily as needed (at night for systolic greater than 140). 90 tablet 3   melatonin 3 MG TABS tablet Take 1.5 mg by mouth at bedtime as needed.     Multiple Vitamin (MULTIVITAMIN) tablet Take 1 tablet by mouth daily.     nitroGLYCERIN (NITROSTAT) 0.4 MG SL tablet Place 1 tablet (0.4 mg total) under the tongue every 5 (five) minutes as needed for chest pain. 25 tablet 1   polyethylene glycol (MIRALAX / GLYCOLAX) packet Take 17 g by mouth daily as needed.     ranolazine (RANEXA) 1000 MG SR tablet TAKE 1 TABLET BY MOUTH 2 TIMES A DAY 60 tablet 5   rosuvastatin (CRESTOR) 10 MG tablet TAKE 1 TABLET BY MOUTH DAILY 90 tablet 2   tamsulosin (FLOMAX) 0.4 MG CAPS capsule TAKE ONE CAPSULE BY MOUTH DAILY 90 capsule 3   omeprazole (PRILOSEC)  20 MG capsule Take 1 capsule (20 mg total) by mouth every morning. 90 capsule 3   No facility-administered medications prior to visit.    Review of Systems;  Patient denies headache, fevers, malaise, unintentional weight loss, skin rash, eye pain, sinus congestion and sinus pain, sore throat, dysphagia,  hemoptysis , cough, dyspnea, wheezing, chest pain, palpitations, orthopnea, edema, abdominal pain, nausea, melena, diarrhea, constipation, flank pain, dysuria, hematuria, urinary  Frequency, nocturia, numbness, tingling, seizures,  Focal weakness, Loss of consciousness,  Tremor, insomnia, depression, anxiety, and suicidal ideation.      Objective:  BP (!) 118/56   Pulse 60   Ht 5\' 7"  (1.702 m)   Wt 161 lb 6.4 oz (73.2 kg)   SpO2 97%   BMI 25.28 kg/m   BP Readings from Last 3 Encounters:  05/22/23 (!) 118/56  03/24/23 (!) 122/58  03/17/23 128/64    Wt Readings from Last 3  Encounters:  05/22/23 161 lb 6.4 oz (73.2 kg)  03/24/23 169 lb (76.7 kg)  03/17/23 170 lb 3.2 oz (77.2 kg)    Physical Exam Vitals reviewed.  Constitutional:      General: He is not in acute distress.    Appearance: He is ill-appearing. He is not toxic-appearing or diaphoretic.  HENT:     Head: Normocephalic.  Eyes:     General: No scleral icterus.       Right eye: No discharge.        Left eye: No discharge.     Conjunctiva/sclera: Conjunctivae normal.  Musculoskeletal:        General: Normal range of motion.     Cervical back: Normal range of motion.  Skin:    General: Skin is warm and dry.     Coloration: Skin is pale.  Neurological:     General: No focal deficit present.     Mental Status: He is alert and oriented to person, place, and time. Mental status is at baseline.     Motor: Weakness present.  Psychiatric:        Mood and Affect: Mood normal.        Behavior: Behavior normal.        Thought Content: Thought content normal.        Judgment: Judgment normal.    Lab Results  Component Value Date   HGBA1C 5.9 09/17/2022   HGBA1C 5.7 03/18/2022   HGBA1C 5.4 05/07/2021    Lab Results  Component Value Date   CREATININE 1.88 (H) 09/17/2022   CREATININE 2.0 (A) 03/04/2022   CREATININE 1.55 (H) 07/06/2021    Lab Results  Component Value Date   WBC 4.3 09/17/2022   HGB 11.3 (L) 09/17/2022   HCT 32.4 (L) 09/17/2022   PLT 151.0 09/17/2022   GLUCOSE 107 (H) 09/17/2022   CHOL 113 09/17/2022   TRIG 86.0 09/17/2022   HDL 60.10 09/17/2022   LDLDIRECT 32.0 09/17/2022   LDLCALC 36 09/17/2022   ALT 5 09/17/2022   AST 14 09/17/2022   NA 140 09/17/2022   K 4.6 09/17/2022   CL 103 09/17/2022   CREATININE 1.88 (H) 09/17/2022   BUN 22 09/17/2022   CO2 28 09/17/2022   TSH 2.04 09/17/2022   PSA 1.10 09/06/2020   HGBA1C 5.9 09/17/2022   MICROALBUR 10.9 (H) 01/21/2023    MR BRAIN WO CONTRAST Result Date: 04/23/2023 CLINICAL DATA:  Weakness and falling since  previous coronavirus infection. Parkinson's disease. EXAM: MRI HEAD WITHOUT CONTRAST TECHNIQUE: Multiplanar, multiecho pulse sequences of  the brain and surrounding structures were obtained without intravenous contrast. COMPARISON:  CT 11/12/2022.  MRI 05/07/2021. FINDINGS: Brain: Diffusion imaging does not show any acute or subacute infarction or other cause of restricted diffusion. No focal abnormality affects the brainstem or cerebellum. No finding specific for Parkinson's disease. Cerebral hemispheres show age related volume loss with mild chronic small-vessel ischemic change of the hemispheric white matter. Findings are minimally progressive over time. No cortical or large vessel territory infarction. No mass, acute hemorrhage, hydrocephalus or extra-axial collection. Multiple punctate foci of hemosiderin deposition are noted associated with some of the old small vessel insults. Vascular: Major vessels at the base of the brain show flow. Skull and upper cervical spine: Negative Sinuses/Orbits: Clear/normal Other: None IMPRESSION: No acute or reversible finding. Age related volume loss with mild chronic small-vessel ischemic change of the hemispheric white matter, minimally progressive over time. Electronically Signed   By: Paulina Fusi M.D.   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   Syncope and collapse Assessment & Plan: Secondary  to orthostasis from severe dehydration .  Transferring  to ER via EMS for iv fluid resuscitation I have ordered and reviewed a 12 lead EKG and find that there are no acute changes and patient is in sinus r bradycardia       Other orders -     Omeprazole; Take 1 capsule (20 mg total) by mouth every morning.  Dispense: 90 capsule; Refill: 3 -     Ondansetron HCl; Take 1 tablet (4 mg total) by mouth every 8 (eight) hours as needed for nausea or vomiting.  Dispense: 20 tablet; Refill: 0     I provided 30 minutes of face-to-face time during this encounter reviewing patient's last visit with me, patient's  most recent visit with cardiology,  nephrology,  and neurology,  recent surgical and non surgical procedures, previous  labs and imaging studies, counseling on currently addressed issues,  and post visit ordering to diagnostics and therapeutics .   Follow-up: No follow-ups on file.   [name], MD

## 2023-05-22 NOTE — ED Triage Notes (Signed)
Pt reports syncope at doctors office. Denies fall to the ground or hitting head. States is dehydrated from being sick last week. Denies pain. Alert and oriented.

## 2023-05-22 NOTE — Patient Instructions (Signed)
You look dehydrated!    Your goal is 60 ounces of liquid daily  Do not use aritficial sweeteners,  use regular gatorade ,  ginger ale etc water,    If miralax bothers stomach ,  switch to colace stools softener   Keep diet simple :  bananas,  rice  applesauce.  Toast  Add grilled or baked chicken pasta,  potatoes    Avoid salads and dairy   Suspend the losartan until your home BP is 140 or higher

## 2023-05-22 NOTE — Assessment & Plan Note (Addendum)
Secondary to orthostasis from severe dehydration .  Transferring  to ER via EMS for iv fluid resuscitation I have ordered and reviewed a 12 lead EKG and find that there are no acute changes and patient is in sinus r bradycardia

## 2023-05-22 NOTE — Assessment & Plan Note (Signed)
Secondary to dehdyration from recent GI illness   in the office he had a systolic  drop  from 142 to 116  with no increase  in BP and patient became syncopal when he stood up .

## 2023-05-25 ENCOUNTER — Ambulatory Visit: Payer: Medicare Other | Admitting: Physical Therapy

## 2023-05-25 DIAGNOSIS — R2689 Other abnormalities of gait and mobility: Secondary | ICD-10-CM

## 2023-05-25 DIAGNOSIS — R269 Unspecified abnormalities of gait and mobility: Secondary | ICD-10-CM | POA: Diagnosis not present

## 2023-05-25 DIAGNOSIS — R2681 Unsteadiness on feet: Secondary | ICD-10-CM

## 2023-05-25 DIAGNOSIS — R278 Other lack of coordination: Secondary | ICD-10-CM | POA: Diagnosis not present

## 2023-05-25 DIAGNOSIS — R262 Difficulty in walking, not elsewhere classified: Secondary | ICD-10-CM

## 2023-05-25 DIAGNOSIS — M6281 Muscle weakness (generalized): Secondary | ICD-10-CM | POA: Diagnosis not present

## 2023-05-25 NOTE — Therapy (Signed)
OUTPATIENT PHYSICAL THERAPY TREATMENT   Patient Name: Johnathan Arnold. MRN: [id number] DOB:12/09/1945, 77 y.o., male Today's Date: 05/25/2023   PCP: [name], MD   REFERRING PROVIDER: [name], MD   END OF SESSION:  PT End of Session - 05/25/23 1021     Visit Number 24    Number of Visits 35    Date for PT Re-Evaluation 06/24/23    Authorization Type Medicare, BCBS supplement    Progress Note Due on Visit 30    PT Start Time 1016    PT Stop Time 1057    PT Time Calculation (min) 41 min    Equipment Utilized During Treatment Gait belt    Activity Tolerance Patient tolerated treatment well;No increased pain    Behavior During Therapy [facility] for tasks assessed/performed               Past Medical History:  Diagnosis Date   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 LIMA to LAD, SVG to D1, OM1 and RPDA   CORONARY ARTERY BYPASS GRAFT  03/1998   5 vessel, [facility]   RIGHT HEART CATH N/A 04/04/2019   Procedure: RIGHT HEART CATH;  Surgeon: Iran Ouch, MD;  Location: [facility] INVASIVE CV LAB;  Service: Cardiovascular;  Laterality: N/A;   RIGHT/LEFT HEART CATH AND CORONARY ANGIOGRAPHY N/A 02/01/2018   Procedure: RIGHT/LEFT HEART CATH AND CORONARY ANGIOGRAPHY;  Surgeon: Iran Ouch, MD;  Location: [facility] INVASIVE CV LAB;  Service: Cardiovascular;  Laterality: N/A;   Patient Active Problem List   Diagnosis Date Noted   Orthostatic hypotension 05/22/2023   Syncope and collapse 05/22/2023   Adverse drug reaction 01/20/2023   History of COVID-19 03/18/2022   History of diabetes  mellitus, type II 03/18/2022   Hospital discharge follow-up 05/16/2021   Generalized weakness 05/07/2021   Fall    Head injury    Bradycardia    Anemia, unspecified 02/07/2021   Neutropenia (HCC) 01/29/2021   Thrombocytopenia (HCC) 01/29/2021   Hyponatremia 12/22/2020   Bilateral leg weakness 12/20/2020   Prostate cancer screening 09/08/2020   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) 09/06/2020   Low back pain 08/19/2019   Pulmonary hypertension (HCC)    Insomnia 12/21/2018   Sleep apnea in adult 12/09/2018   Periodic limb movement disorder 12/09/2018   Pulmonary nodules 05/18/2018   Wears hearing aid in both ears 05/17/2018   Leg pain, bilateral 02/20/2018   Dyspnea    CKD (chronic kidney disease) stage 3, GFR 30-59 ml/min (HCC) 09/21/2017   History of skin cancer in adulthood 08/14/2016   Parkinson's disease (HCC) 08/09/2016   Bilateral carotid artery stenosis 04/02/2015   Vertigo, peripheral 10/17/2014   Benign prostatic hyperplasia with urinary frequency 01/31/2014   Encounter for Medicare annual wellness exam 07/02/2013   Obesity 04/03/2013   Other malaise and fatigue 09/21/2012   Hyperlipidemia    Hypertension    3-vessel coronary artery disease    S/P CABG x 5     ONSET DATE: 01/20/23  REFERRING  DIAG: R29.898 (ICD-10-CM) - Weakness of both lower extremities   THERAPY DIAG:  Unsteadiness on feet  Difficulty in walking, not elsewhere classified  Other abnormalities of gait and mobility  Abnormality of gait and mobility  Muscle weakness (generalized)  Rationale for Evaluation and Treatment: Rehabilitation  SUBJECTIVE:                                                                                                                                                                                             SUBJECTIVE STATEMENT:  Pt reports having recent illness. Reports he feels weaker as a result but is generally feeling better.   Pt  accompanied by: self  PERTINENT HISTORY: PD, history of COVID and significant weakness present following this diagnosis and the use of paxlovid ( August 5 dx date, thinks Paxlovid may have exacerbated PD symptoms)   Pt reports having COVID and having significant weakness.  Patient reports he also has back pain that is not present for a long time but was exacerbated more recently.  Patient previously did physical therapy for his back and experienced some relief but has not been consistent with the exercises.  Patient also has history of going to Parkinson's rock steady classes multiple times per week prior to onset of his COVID but he has not been back since. Patient previously ambulated without an assistive device but is now ambulating with a straight point cane.  Patient reports increased foot and ankle weakness in comparison with his hips and knees.  Patient also reports low back pain that is exacerbated with prolonged standing. PAIN:  Are you having pain? No pain   PRECAUTIONS: Fall  RED FLAGS: None   WEIGHT BEARING RESTRICTIONS: No  FALLS: Has patient fallen in last 6 months? Yes. Number of falls 2-3  LIVING ENVIRONMENT: Lives with: lives with their family and lives with their spouse Lives in: House/apartment Stairs: Yes: Internal: 15 steps; on left going up and External: 1 steps; none Has following equipment at home: Quad cane large base  PLOF: Independent  PATIENT GOALS: To improve strength to prior to his COVID diagnosis   OBJECTIVE:   TODAY'S TREATMENT:  05/25/23  Octane level 5 x 6 min for aerobic priming and for UE and LE reciprocal large movement initiation  -attempted leg press mode for LE strength x 2 rounds   Donned 4# AW  LAQ x 10 reps ea LE with 3 sec hold Immediate 150 ft walk, cues for picking up feet and maintaining posture  LAQ x 10 ea  LE  STS x 10 from standard chair Standing march x 10  STS with feet elevated 1 inch x 10 reps  Standing march x 10  Doffed 4# AW   Forward lunge x 10 bil UE supported  Water breaks throughout as pt feeling thirsty and recently was treated for dehydration.    PATIENT EDUCATION: Education details: POC .  Pt educated throughout session about proper posture and technique with exercises. Improved exercise technique, movement at target joints, use of target muscles after min to mod verbal, visual, tactile cues.  Person educated: Patient Education method: Explanation Education comprehension: verbalized understanding  HOME EXERCISE PROGRAM: Access Code: [facility] URL: [email or web address] Date: 01/30/2023 Prepared by: Thresa Ross  Exercises - Shoulder extension with resistance - Neutral  - 1 x daily - 7 x weekly - 2 sets - 12 reps - Standing Shoulder Row with Anchored Resistance  - 1 x daily - 7 x weekly - 2 sets - 15 reps - Shoulder External Rotation and Scapular Retraction with Resistance  - 1 x daily - 7 x weekly - 2 sets - 10 reps - Seated Shoulder Horizontal Abduction with Resistance - Palms Down  - 1 x daily - 7 x weekly - 2 sets - 12 reps -Standing PWR! Moves x 10 ea (UP, ROCK, TWIST,STEP)  GOALS: Goals reviewed with patient? Yes  SHORT TERM GOALS: Target date: 04/09/2023    Patient will be independent in home exercise program to improve strength/mobility for better functional independence with ADLs. Baseline: No HEP currently  10/2: Was doing them but has not been lately  Goal status:MET     LONG TERM GOALS: Target date: 05/07/2023  1.  Patient will increase FOTO score to equal to or greater than  62   to demonstrate statistically significant improvement in mobility and quality of life.  Baseline: 53  10/2:52 11/20: 66 Goal status: MET   2.  Patient will increase Mini BEST Balance score by > 4  points to demonstrate decreased fall risk during  functional activities. Baseline: 2111/20: 25  Goal status: MET   3.   Patient will improve LE muscle strength to > 4+/5 in hip, knee and ankle strength in order to indicate return to prior level of LE strength  Baseline: see eval chart  Goal status: MET  4.   Patient will increase six minute walk test distance to >1100 with pain less than 5/10 throughout for progression to community ambulator and improve gait ability without discomfort Baseline: 890 ft in 5 minutes, througohut test but more profoundly at 4 minutes pt began to have stooped kyphotic posture. This worsened and was followed by back pain (6/10) that caused the test to be suspended at 5 minutes.  10/2: 990 ft with similar progression of flexed posture throughout. Pain 5/10 at 5:30  11/20: 750 ft, back pain starts, 600 ft kyphotic posture starts  Goal status: ONGOING  5.  Patient will improve 32nd chair stand to 14 repetitions or greater in order to indicate improved lower extremity strength and power as well as to improve his result to that of his age-matched norms.  Baseline: 11/20: 10 Goal status: INITIAL    ASSESSMENT:  CLINICAL IMPRESSION:   Patient presents with good motivation for completion of physical therapy activities.  Patient experiencing some weakness following recent illness where he was bedbound for a few days.  Patient is feeling better today and was able to tolerate various lower extremity strengthening exercises but at a light and load compared to previous physical therapy session.Pt will continue to benefit from skilled physical therapy intervention to address impairments, improve QOL, and attain therapy goals.     OBJECTIVE IMPAIRMENTS: decreased activity tolerance, decreased balance, decreased mobility, difficulty walking, and decreased strength.   ACTIVITY LIMITATIONS: standing, squatting, stairs, and locomotion level  PARTICIPATION LIMITATIONS: community activity  PERSONAL FACTORS: 3+  comorbidities: PD, HLD, HTN, Back pain  are also affecting patient's functional outcome.   REHAB POTENTIAL: Good  CLINICAL DECISION MAKING: Stable/uncomplicated  EVALUATION COMPLEXITY: Low  PLAN:  PT FREQUENCY: 2x/week  PT DURATION: 6 weeks  PLANNED INTERVENTIONS: Therapeutic exercises, Therapeutic activity, Neuromuscular re-education, Balance training, Gait training, Patient/Family education, Self Care, Joint mobilization, Joint manipulation, Spinal manipulation, Spinal mobilization, Moist heat, and Manual therapy  PLAN FOR NEXT SESSION:  LE strength program- squats, lunges, leg press, weighted heel raises, try to superset with postural endurance and training exercise.   Continue Antigravity extension activities to improve posture with prolonged ambulation, PD specific exercises with balance focus and postural focus.   Norman Herrlich PT ,DPT Physical Therapist- [place]  [facility]   1:51 PM 05/25/23

## 2023-05-27 ENCOUNTER — Ambulatory Visit: Payer: Medicare Other | Admitting: Physical Therapy

## 2023-05-27 DIAGNOSIS — R278 Other lack of coordination: Secondary | ICD-10-CM | POA: Diagnosis not present

## 2023-05-27 DIAGNOSIS — R29898 Other symptoms and signs involving the musculoskeletal system: Secondary | ICD-10-CM

## 2023-05-27 DIAGNOSIS — R2681 Unsteadiness on feet: Secondary | ICD-10-CM | POA: Diagnosis not present

## 2023-05-27 DIAGNOSIS — R2689 Other abnormalities of gait and mobility: Secondary | ICD-10-CM

## 2023-05-27 DIAGNOSIS — R269 Unspecified abnormalities of gait and mobility: Secondary | ICD-10-CM

## 2023-05-27 DIAGNOSIS — M6281 Muscle weakness (generalized): Secondary | ICD-10-CM

## 2023-05-27 DIAGNOSIS — M5459 Other low back pain: Secondary | ICD-10-CM

## 2023-05-27 DIAGNOSIS — R262 Difficulty in walking, not elsewhere classified: Secondary | ICD-10-CM | POA: Diagnosis not present

## 2023-05-27 NOTE — Therapy (Signed)
OUTPATIENT PHYSICAL THERAPY TREATMENT   Patient Name: Johnathan Arnold. MRN: [id number] DOB:1946-04-23, 77 y.o., male Today's Date: 05/27/2023   PCP: [name], MD   REFERRING PROVIDER: [name], MD   END OF SESSION:  PT End of Session - 05/27/23 1021     Visit Number 25    Number of Visits 35    Date for PT Re-Evaluation 06/24/23    Authorization Type Medicare, BCBS supplement    Progress Note Due on Visit 30    PT Start Time 1022    PT Stop Time 1100    PT Time Calculation (min) 38 min    Equipment Utilized During Treatment Gait belt    Activity Tolerance Patient tolerated treatment well;No increased pain    Behavior During Therapy [facility] for tasks assessed/performed               Past Medical History:  Diagnosis Date   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 LIMA to LAD, SVG to D1, OM1 and RPDA   CORONARY ARTERY BYPASS GRAFT  03/1998   5 vessel, [facility]   RIGHT HEART CATH N/A 04/04/2019   Procedure: RIGHT HEART CATH;  Surgeon: Iran Ouch, MD;  Location: [facility] INVASIVE CV LAB;  Service: Cardiovascular;  Laterality: N/A;   RIGHT/LEFT HEART CATH AND CORONARY ANGIOGRAPHY N/A 02/01/2018   Procedure: RIGHT/LEFT HEART CATH AND CORONARY ANGIOGRAPHY;  Surgeon: Iran Ouch, MD;  Location: [facility] INVASIVE CV LAB;  Service: Cardiovascular;  Laterality: N/A;   Patient Active Problem List   Diagnosis Date Noted   Orthostatic hypotension 05/22/2023   Syncope and collapse 05/22/2023   Adverse drug reaction 01/20/2023   History of COVID-19 03/18/2022   History of diabetes  mellitus, type II 03/18/2022   Hospital discharge follow-up 05/16/2021   Generalized weakness 05/07/2021   Fall    Head injury    Bradycardia    Anemia, unspecified 02/07/2021   Neutropenia (HCC) 01/29/2021   Thrombocytopenia (HCC) 01/29/2021   Hyponatremia 12/22/2020   Bilateral leg weakness 12/20/2020   Prostate cancer screening 09/08/2020   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) 09/06/2020   Low back pain 08/19/2019   Pulmonary hypertension (HCC)    Insomnia 12/21/2018   Sleep apnea in adult 12/09/2018   Periodic limb movement disorder 12/09/2018   Pulmonary nodules 05/18/2018   Wears hearing aid in both ears 05/17/2018   Leg pain, bilateral 02/20/2018   Dyspnea    CKD (chronic kidney disease) stage 3, GFR 30-59 ml/min (HCC) 09/21/2017   History of skin cancer in adulthood 08/14/2016   Parkinson's disease (HCC) 08/09/2016   Bilateral carotid artery stenosis 04/02/2015   Vertigo, peripheral 10/17/2014   Benign prostatic hyperplasia with urinary frequency 01/31/2014   Encounter for Medicare annual wellness exam 07/02/2013   Obesity 04/03/2013   Other malaise and fatigue 09/21/2012   Hyperlipidemia    Hypertension    3-vessel coronary artery disease    S/P CABG x 5     ONSET DATE: 01/20/23  REFERRING  DIAG: R29.898 (ICD-10-CM) - Weakness of both lower extremities   THERAPY DIAG:  Unsteadiness on feet  Difficulty in walking, not elsewhere classified  Other abnormalities of gait and mobility  Abnormality of gait and mobility  Muscle weakness (generalized)  Other lack of coordination  Other low back pain  Weakness of both hips  Rationale for Evaluation and Treatment: Rehabilitation  SUBJECTIVE:                                                                                                                                                                                             SUBJECTIVE STATEMENT:  Pt reports having recent illness.  Reports he feels weaker as a result but is generally feeling better.   Pt accompanied by: self  PERTINENT HISTORY: PD, history of COVID and significant weakness present following this diagnosis and the use of paxlovid ( August 5 dx date, thinks Paxlovid may have exacerbated PD symptoms)   Pt reports having COVID and having significant weakness.  Patient reports he also has back pain that is not present for a long time but was exacerbated more recently.  Patient previously did physical therapy for his back and experienced some relief but has not been consistent with the exercises.  Patient also has history of going to Parkinson's rock steady classes multiple times per week prior to onset of his COVID but he has not been back since. Patient previously ambulated without an assistive device but is now ambulating with a straight point cane.  Patient reports increased foot and ankle weakness in comparison with his hips and knees.  Patient also reports low back pain that is exacerbated with prolonged standing. PAIN:  Are you having pain? No pain   PRECAUTIONS: Fall  RED FLAGS: None   WEIGHT BEARING RESTRICTIONS: No  FALLS: Has patient fallen in last 6 months? Yes. Number of falls 2-3  LIVING ENVIRONMENT: Lives with: lives with their family and lives with their spouse Lives in: House/apartment Stairs: Yes: Internal: 15 steps; on left going up and External: 1 steps; none Has following equipment at home: Quad cane large base  PLOF: Independent  PATIENT GOALS: To improve strength to prior to his COVID diagnosis   OBJECTIVE:   TODAY'S TREATMENT:  05/27/23  Octane level 5 x 6 min for aerobic priming and for UE and LE reciprocal large movement initiation    Donned 4# AW  LAQ x 15 reps ea LE with 3 sec hold Hip flexion/abduction/adduction over 4inch bolster. X 12 bil   Stair ascent/descent x 3 bouts, 4 steps each.  Sit<>stand with bil overhead reach 2 x10  Sit<>stand with Bil arm swing from 90 deg flexion into extension in standing.  300 ft x2 walk, cues for picking up feet and maintaining posture  Seated mid row RTB x 12  Seated scaption/diagonal lift with RTB x 12   Four square stepping clockwise x 5 and CCW x 5 with 5W AW. Min cues for posture. Min assist x 1 to prevent posterior LOB with lateral step to the L   Water breaks throughout as pt feeling thirsty and recently was treated for dehydration.    PATIENT EDUCATION: Education details: POC .  Pt educated throughout session about proper posture and technique with exercises. Improved exercise technique, movement at target joints, use of target muscles after min to mod verbal, visual, tactile cues.  Person educated: Patient Education method: Explanation Education comprehension: verbalized understanding  HOME EXERCISE PROGRAM: Access Code: [facility] URL: [email or web address] Date: 01/30/2023 Prepared by: Thresa Ross  Exercises - Shoulder extension with resistance - Neutral  - 1 x daily - 7 x weekly - 2 sets - 12 reps - Standing Shoulder Row with Anchored Resistance  - 1 x daily - 7 x weekly - 2 sets - 15 reps - Shoulder External Rotation and Scapular Retraction with Resistance  - 1 x daily - 7 x weekly - 2 sets - 10 reps - Seated Shoulder Horizontal Abduction with Resistance - Palms Down  - 1 x daily - 7 x weekly - 2 sets - 12 reps -Standing PWR! Moves x 10 ea (UP, ROCK, TWIST,STEP)  GOALS: Goals reviewed with patient? Yes  SHORT TERM GOALS: Target date: 04/09/2023    Patient will be independent in home exercise program to improve strength/mobility for better functional independence with ADLs. Baseline: No HEP currently  10/2: Was doing them but has not been lately  Goal status:MET     LONG TERM GOALS: Target date: 05/07/2023  1.  Patient will increase FOTO  score to equal to or greater than  62   to demonstrate statistically significant improvement in mobility and quality of life.  Baseline: 53  10/2:52 11/20: 66 Goal status: MET   2.  Patient will increase Mini BEST Balance score by > 4  points to demonstrate decreased fall risk during functional activities. Baseline: 2111/20: 25  Goal status: MET   3.   Patient will improve LE muscle strength to > 4+/5 in hip, knee and ankle strength in order to indicate return to prior level of LE strength  Baseline: see eval chart  Goal status: MET  4.   Patient will increase six minute walk test distance to >1100 with pain less than 5/10 throughout for progression to community ambulator and improve gait ability without discomfort Baseline: 890 ft in 5 minutes, througohut test but more profoundly at 4 minutes pt began to have stooped kyphotic posture. This worsened and was followed by back pain (6/10) that caused the test to be suspended at 5 minutes.  10/2: 990 ft with similar progression of flexed posture throughout. Pain 5/10 at 5:30  11/20: 750 ft, back pain starts, 600 ft kyphotic posture starts  Goal status: ONGOING  5.  Patient will improve 32nd chair stand to 14 repetitions or greater in order to indicate improved lower extremity strength and power as well as to improve his result to that of his age-matched norms. Baseline: 11/20: 10 Goal status: INITIAL    ASSESSMENT:  CLINICAL IMPRESSION:   Patient presents with good motivation for completion of physical therapy activities.  Continued weakness noted from recent illness as well as sustained back pain. Patient is feeling better today and was able to tolerate various lower extremity strengthening exercises but at a light and load compared to previous physical therapy session. Tolerated increased weight compared to prior session and increased distance with weighted gait training. Min cues for posture and reciprocal arm swing throughout  session.  Pt will continue to benefit from skilled physical therapy intervention to address impairments, improve QOL, and attain therapy goals.     OBJECTIVE IMPAIRMENTS: decreased activity tolerance, decreased balance, decreased mobility, difficulty walking, and decreased strength.   ACTIVITY LIMITATIONS: standing, squatting, stairs, and locomotion level  PARTICIPATION LIMITATIONS: community activity  PERSONAL FACTORS: 3+ comorbidities: PD, HLD, HTN, Back pain  are also affecting patient's functional outcome.   REHAB POTENTIAL: Good  CLINICAL DECISION MAKING: Stable/uncomplicated  EVALUATION COMPLEXITY: Low  PLAN:  PT FREQUENCY: 2x/week  PT DURATION: 6 weeks  PLANNED INTERVENTIONS: Therapeutic exercises, Therapeutic activity, Neuromuscular re-education, Balance training, Gait training, Patient/Family education, Self Care, Joint mobilization, Joint manipulation, Spinal manipulation, Spinal mobilization, Moist heat, and Manual therapy  PLAN FOR NEXT SESSION:    Continue Antigravity extension activities to improve posture with prolonged ambulation, PD specific exercises with balance focus and postural focus.   Golden Pop PT ,DPT Physical Therapist- [place]  [facility]   10:22 AM 05/27/23       +

## 2023-06-01 ENCOUNTER — Encounter: Payer: Self-pay | Admitting: Physical Therapy

## 2023-06-01 ENCOUNTER — Ambulatory Visit: Payer: Medicare Other | Admitting: Physical Therapy

## 2023-06-01 DIAGNOSIS — R2689 Other abnormalities of gait and mobility: Secondary | ICD-10-CM | POA: Diagnosis not present

## 2023-06-01 DIAGNOSIS — R262 Difficulty in walking, not elsewhere classified: Secondary | ICD-10-CM | POA: Diagnosis not present

## 2023-06-01 DIAGNOSIS — R2681 Unsteadiness on feet: Secondary | ICD-10-CM

## 2023-06-01 DIAGNOSIS — R269 Unspecified abnormalities of gait and mobility: Secondary | ICD-10-CM

## 2023-06-01 DIAGNOSIS — R278 Other lack of coordination: Secondary | ICD-10-CM | POA: Diagnosis not present

## 2023-06-01 DIAGNOSIS — M6281 Muscle weakness (generalized): Secondary | ICD-10-CM | POA: Diagnosis not present

## 2023-06-01 NOTE — Therapy (Signed)
OUTPATIENT PHYSICAL THERAPY TREATMENT   Patient Name: Johnathan Arnold. MRN: [id number] DOB:05-Feb-1946, 77 y.o., male Today's Date: 06/01/2023   PCP: [name], MD   REFERRING PROVIDER: [name], MD   END OF SESSION:  PT End of Session - 06/01/23 1141     Visit Number 26    Number of Visits 35    Date for PT Re-Evaluation 06/24/23    Authorization Type Medicare, BCBS supplement    Progress Note Due on Visit 30    PT Start Time 1135    PT Stop Time 1216    PT Time Calculation (min) 41 min    Equipment Utilized During Treatment Gait belt    Activity Tolerance Patient tolerated treatment well;No increased pain    Behavior During Therapy [facility] for tasks assessed/performed               Past Medical History:  Diagnosis Date   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 LIMA to LAD, SVG to D1, OM1 and RPDA   CORONARY ARTERY BYPASS GRAFT  03/1998   5 vessel, [facility]   RIGHT HEART CATH N/A 04/04/2019   Procedure: RIGHT HEART CATH;  Surgeon: Iran Ouch, MD;  Location: [facility] INVASIVE CV LAB;  Service: Cardiovascular;  Laterality: N/A;   RIGHT/LEFT HEART CATH AND CORONARY ANGIOGRAPHY N/A 02/01/2018   Procedure: RIGHT/LEFT HEART CATH AND CORONARY ANGIOGRAPHY;  Surgeon: Iran Ouch, MD;  Location: [facility] INVASIVE CV LAB;  Service: Cardiovascular;  Laterality: N/A;   Patient Active Problem List   Diagnosis Date Noted   Orthostatic hypotension 05/22/2023   Syncope and collapse 05/22/2023   Adverse drug reaction 01/20/2023   History of COVID-19 03/18/2022   History of diabetes  mellitus, type II 03/18/2022   Hospital discharge follow-up 05/16/2021   Generalized weakness 05/07/2021   Fall    Head injury    Bradycardia    Anemia, unspecified 02/07/2021   Neutropenia (HCC) 01/29/2021   Thrombocytopenia (HCC) 01/29/2021   Hyponatremia 12/22/2020   Bilateral leg weakness 12/20/2020   Prostate cancer screening 09/08/2020   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) 09/06/2020   Low back pain 08/19/2019   Pulmonary hypertension (HCC)    Insomnia 12/21/2018   Sleep apnea in adult 12/09/2018   Periodic limb movement disorder 12/09/2018   Pulmonary nodules 05/18/2018   Wears hearing aid in both ears 05/17/2018   Leg pain, bilateral 02/20/2018   Dyspnea    CKD (chronic kidney disease) stage 3, GFR 30-59 ml/min (HCC) 09/21/2017   History of skin cancer in adulthood 08/14/2016   Parkinson's disease (HCC) 08/09/2016   Bilateral carotid artery stenosis 04/02/2015   Vertigo, peripheral 10/17/2014   Benign prostatic hyperplasia with urinary frequency 01/31/2014   Encounter for Medicare annual wellness exam 07/02/2013   Obesity 04/03/2013   Other malaise and fatigue 09/21/2012   Hyperlipidemia    Hypertension    3-vessel coronary artery disease    S/P CABG x 5     ONSET DATE: 01/20/23  REFERRING  DIAG: R29.898 (ICD-10-CM) - Weakness of both lower extremities   THERAPY DIAG:  Unsteadiness on feet  Difficulty in walking, not elsewhere classified  Other abnormalities of gait and mobility  Abnormality of gait and mobility  Rationale for Evaluation and Treatment: Rehabilitation  SUBJECTIVE:                                                                                                                                                                                             SUBJECTIVE STATEMENT:  Pt reports no new concerns since last session. No falls or illness.   Pt accompanied by: self  PERTINENT HISTORY: PD, history of COVID and  significant weakness present following this diagnosis and the use of paxlovid ( August 5 dx date, thinks Paxlovid may have exacerbated PD symptoms)   Pt reports having COVID and having significant weakness.  Patient reports he also has back pain that is not present for a long time but was exacerbated more recently.  Patient previously did physical therapy for his back and experienced some relief but has not been consistent with the exercises.  Patient also has history of going to Parkinson's rock steady classes multiple times per week prior to onset of his COVID but he has not been back since. Patient previously ambulated without an assistive device but is now ambulating with a straight point cane.  Patient reports increased foot and ankle weakness in comparison with his hips and knees.  Patient also reports low back pain that is exacerbated with prolonged standing. PAIN:  Are you having pain? No pain   PRECAUTIONS: Fall  RED FLAGS: None   WEIGHT BEARING RESTRICTIONS: No  FALLS: Has patient fallen in last 6 months? Yes. Number of falls 2-3  LIVING ENVIRONMENT: Lives with: lives with their family and lives with their spouse Lives in: House/apartment Stairs: Yes: Internal: 15 steps; on left going up and External: 1 steps; none Has following equipment at home: Quad cane large base  PLOF: Independent  PATIENT GOALS: To improve strength to prior to his COVID diagnosis   OBJECTIVE:   TODAY'S TREATMENT:  06/01/23  Octane x 3 min level 1 then x 3 min Leg press mode with HIIT focuses on LE strength   Leg press 3x10 @ 55 lb on precor leg press   Sewated PWR! Up with boost of resisted HABD 2 x 10   Donned 4# AW  LAQ 2 x 12 reps ea LE with 3 sec hold Steps x 16 steps  ascending and descending  Sidestep up steps x 8 ea side  Hip flexion/abduction/adduction over 1/2  bolster . 2 X 12 bil  Sit<>stand with bil overhead reach with 5# weighted bar 2 x10   Knee flexion 2 x 10 in standing Standing heel raises 2 x 15 reps     PATIENT EDUCATION: Education details: POC .  Pt educated throughout session about proper posture and technique with exercises. Improved exercise technique, movement at target joints, use of target muscles after min to mod verbal, visual, tactile cues.  Person educated: Patient Education method: Explanation Education comprehension: verbalized understanding  HOME EXERCISE PROGRAM: Access Code: [facility] URL: [email or web address] Date: 01/30/2023 Prepared by: Thresa Ross  Exercises - Shoulder extension with resistance - Neutral  - 1 x daily - 7 x weekly - 2 sets - 12 reps - Standing Shoulder Row with Anchored Resistance  - 1 x daily - 7 x weekly - 2 sets - 15 reps - Shoulder External Rotation and Scapular Retraction with Resistance  - 1 x daily - 7 x weekly - 2 sets - 10 reps - Seated Shoulder Horizontal Abduction with Resistance - Palms Down  - 1 x daily - 7 x weekly - 2 sets - 12 reps -Standing PWR! Moves x 10 ea (UP, ROCK, TWIST,STEP)  GOALS: Goals reviewed with patient? Yes  SHORT TERM GOALS: Target date: 04/09/2023    Patient will be independent in home exercise program to improve strength/mobility for better functional independence with ADLs. Baseline: No HEP currently  10/2: Was doing them but has not been lately  Goal status:MET     LONG TERM GOALS: Target date: 05/07/2023  1.  Patient will increase FOTO score to equal to or greater than  62   to demonstrate statistically significant improvement in mobility and quality of life.  Baseline: 53  10/2:52 11/20: 66 Goal status: MET   2.  Patient will increase Mini BEST Balance score by > 4  points to demonstrate decreased fall risk during functional activities. Baseline: 2111/20: 25  Goal status: MET   3.   Patient will improve LE muscle  strength to > 4+/5 in hip, knee and ankle strength in order to indicate return to prior level of LE strength  Baseline: see eval chart  Goal status: MET  4.   Patient will increase six minute walk test distance to >1100 with pain less than 5/10 throughout for progression to community ambulator and improve gait ability without discomfort Baseline: 890 ft in 5 minutes, througohut test but more profoundly at 4 minutes pt began to have stooped kyphotic posture. This worsened and was followed by back pain (6/10) that caused the test to be suspended at 5 minutes.  10/2: 990 ft with similar progression of flexed posture throughout. Pain 5/10 at 5:30  11/20: 750 ft, back pain starts, 600 ft kyphotic posture starts  Goal status: ONGOING  5.  Patient will improve 32nd chair stand to 14 repetitions or greater in order to indicate improved lower extremity strength and power as well as to improve his result to that of his age-matched  norms. Baseline: 11/20: 10 Goal status: INITIAL    ASSESSMENT:  CLINICAL IMPRESSION:   Patient presents with good motivation for completion of physical therapy activities.  Today's treatment focused on heavy lower extremity strength training as well as Parkinson's specific movement patterns.  Patient responded with improved tolerance to exercise today compared to last session with this physical therapist.Pt will continue to benefit from skilled physical therapy intervention to address impairments, improve QOL, and attain therapy goals.     OBJECTIVE IMPAIRMENTS: decreased activity tolerance, decreased balance, decreased mobility, difficulty walking, and decreased strength.   ACTIVITY LIMITATIONS: standing, squatting, stairs, and locomotion level  PARTICIPATION LIMITATIONS: community activity  PERSONAL FACTORS: 3+ comorbidities: PD, HLD, HTN, Back pain  are also affecting patient's functional outcome.   REHAB POTENTIAL: Good  CLINICAL DECISION MAKING:  Stable/uncomplicated  EVALUATION COMPLEXITY: Low  PLAN:  PT FREQUENCY: 2x/week  PT DURATION: 6 weeks  PLANNED INTERVENTIONS: Therapeutic exercises, Therapeutic activity, Neuromuscular re-education, Balance training, Gait training, Patient/Family education, Self Care, Joint mobilization, Joint manipulation, Spinal manipulation, Spinal mobilization, Moist heat, and Manual therapy  PLAN FOR NEXT SESSION:    Continue Antigravity extension activities to improve posture with prolonged ambulation, PD specific exercises with balance focus and postural focus.   Norman Herrlich PT ,DPT Physical Therapist- [facility]   3:08 PM 06/01/23       +

## 2023-06-03 ENCOUNTER — Other Ambulatory Visit: Payer: Self-pay | Admitting: Cardiovascular Disease

## 2023-06-03 ENCOUNTER — Other Ambulatory Visit: Payer: Self-pay | Admitting: Neurology

## 2023-06-03 DIAGNOSIS — G20A1 Parkinson's disease without dyskinesia, without mention of fluctuations: Secondary | ICD-10-CM

## 2023-06-08 DIAGNOSIS — H6123 Impacted cerumen, bilateral: Secondary | ICD-10-CM | POA: Diagnosis not present

## 2023-06-08 DIAGNOSIS — H903 Sensorineural hearing loss, bilateral: Secondary | ICD-10-CM | POA: Diagnosis not present

## 2023-06-09 ENCOUNTER — Ambulatory Visit: Payer: Medicare Other

## 2023-06-09 DIAGNOSIS — R278 Other lack of coordination: Secondary | ICD-10-CM | POA: Diagnosis not present

## 2023-06-09 DIAGNOSIS — R262 Difficulty in walking, not elsewhere classified: Secondary | ICD-10-CM

## 2023-06-09 DIAGNOSIS — R2689 Other abnormalities of gait and mobility: Secondary | ICD-10-CM | POA: Diagnosis not present

## 2023-06-09 DIAGNOSIS — R2681 Unsteadiness on feet: Secondary | ICD-10-CM | POA: Diagnosis not present

## 2023-06-09 DIAGNOSIS — R269 Unspecified abnormalities of gait and mobility: Secondary | ICD-10-CM | POA: Diagnosis not present

## 2023-06-09 DIAGNOSIS — M6281 Muscle weakness (generalized): Secondary | ICD-10-CM | POA: Diagnosis not present

## 2023-06-09 NOTE — Therapy (Signed)
 OUTPATIENT PHYSICAL THERAPY TREATMENT   Patient Name: Johnathan Arnold. MRN: [id number] DOB:13-Oct-1945, 77 y.o., male Today's Date: 06/09/2023   PCP: Marylynn Verneita CROME, MD  REFERRING PROVIDER: Marylynn Verneita CROME, MD   END OF SESSION:  PT End of Session - 06/09/23 1146     Visit Number 27    Number of Visits 35    Date for PT Re-Evaluation 06/24/23    Authorization Type Medicare, BCBS supplement    Progress Note Due on Visit 30    PT Start Time 1146    PT Stop Time 1230    PT Time Calculation (min) 44 min    Equipment Utilized During Treatment Gait belt    Activity Tolerance Patient tolerated treatment well;No increased pain    Behavior During Therapy [facility] for tasks assessed/performed              Past Medical History:  Diagnosis Date   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 LIMA to LAD, SVG to D1, OM1 and RPDA   CORONARY ARTERY BYPASS GRAFT  03/1998   5 vessel, [facility]   RIGHT HEART CATH N/A 04/04/2019   Procedure: RIGHT HEART CATH;  Surgeon: Darron Deatrice LABOR, MD;  Location: [facility] INVASIVE CV LAB;  Service: Cardiovascular;  Laterality: N/A;   RIGHT/LEFT HEART CATH AND CORONARY ANGIOGRAPHY N/A 02/01/2018   Procedure: RIGHT/LEFT HEART CATH AND CORONARY ANGIOGRAPHY;  Surgeon: Darron Deatrice LABOR, MD;  Location: [facility] INVASIVE CV LAB;  Service: Cardiovascular;  Laterality: N/A;   Patient Active Problem List   Diagnosis Date Noted   Orthostatic hypotension 05/22/2023   Syncope and collapse 05/22/2023   Adverse drug reaction 01/20/2023   History of COVID-19 03/18/2022   History of diabetes  mellitus, type II 03/18/2022   Hospital discharge follow-up 05/16/2021   Generalized weakness 05/07/2021   Fall    Head injury    Bradycardia    Anemia, unspecified 02/07/2021   Neutropenia (HCC) 01/29/2021   Thrombocytopenia (HCC) 01/29/2021   Hyponatremia 12/22/2020   Bilateral leg weakness 12/20/2020   Prostate cancer screening 09/08/2020   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) 09/06/2020   Low back pain 08/19/2019   Pulmonary hypertension (HCC)    Insomnia 12/21/2018   Sleep apnea in adult 12/09/2018   Periodic limb movement disorder 12/09/2018   Pulmonary nodules 05/18/2018   Wears hearing aid in both ears 05/17/2018   Leg pain, bilateral 02/20/2018   Dyspnea    CKD (chronic kidney disease) stage 3, GFR 30-59 ml/min (HCC) 09/21/2017   History of skin cancer in adulthood 08/14/2016   Parkinson's disease (HCC) 08/09/2016   Bilateral carotid artery stenosis 04/02/2015   Vertigo, peripheral 10/17/2014   Benign prostatic hyperplasia with urinary frequency 01/31/2014   Encounter for Medicare annual wellness exam 07/02/2013   Obesity 04/03/2013   Other malaise and fatigue 09/21/2012   Hyperlipidemia    Hypertension    3-vessel coronary artery disease    S/P CABG x 5     ONSET DATE: 01/20/23  REFERRING DIAG: M70.101 (  ICD-10-CM) - Weakness of both lower extremities   THERAPY DIAG:  Unsteadiness on feet  Difficulty in walking, not elsewhere classified  Other abnormalities of gait and mobility  Abnormality of gait and mobility  Rationale for Evaluation and Treatment: Rehabilitation  SUBJECTIVE:                                                                                                                                                                                             SUBJECTIVE STATEMENT:  Pt denies any complications upon arrival.  Pt is doing well otherwise and is ready for the new year.  Pt accompanied by: self  PERTINENT  HISTORY: PD, history of COVID and significant weakness present following this diagnosis and the use of paxlovid ( August 5 dx date, thinks Paxlovid may have exacerbated PD symptoms)   Pt reports having COVID and having significant weakness.  Patient reports he also has back pain that is not present for a long time but was exacerbated more recently.  Patient previously did physical therapy for his back and experienced some relief but has not been consistent with the exercises.  Patient also has history of going to Parkinson's rock steady classes multiple times per week prior to onset of his COVID but he has not been back since. Patient previously ambulated without an assistive device but is now ambulating with a straight point cane.  Patient reports increased foot and ankle weakness in comparison with his hips and knees.  Patient also reports low back pain that is exacerbated with prolonged standing.  PAIN:  Are you having pain? No pain   PRECAUTIONS: Fall  RED FLAGS: None   WEIGHT BEARING RESTRICTIONS: No  FALLS: Has patient fallen in last 6 months? Yes. Number of falls 2-3  LIVING ENVIRONMENT: Lives with: lives with their family and lives with their spouse Lives in: House/apartment Stairs: Yes: Internal: 15 steps; on left going up and External: 1 steps; none Has following equipment at home: Quad cane large base  PLOF: Independent  PATIENT GOALS: To improve strength to prior to his COVID diagnosis   OBJECTIVE:   TODAY'S TREATMENT: Date:  06/09/23   TherEx:  Octane x 3 min level 1 then x 3 min Leg press mode with HIIT focuses on LE strength   Leg press 3x10 @ 55 lb on precor leg press   Donned 5# AW  LAQ 2x10 reps ea LE with 3 sec hold Steps x 16 steps  ascending and descending  Sidestep up 6 step, x10 each direction Standing UE supported hamstring curls, 2x10 each LE Standing UE supported hip extensions, 2x10 each LE  Standing heel raises with UE support, 2x10 Standing  toe raises with UE support, 2x10 Standing backwards lunge with TRX straps, x10 each LE leading    PATIENT EDUCATION: Education details: POC .  Pt educated throughout session about proper posture and technique with exercises. Improved exercise technique, movement at target joints, use of target muscles after min to mod verbal, visual, tactile cues.  Person educated: Patient Education method: Explanation Education comprehension: verbalized understanding  HOME EXERCISE PROGRAM: Access Code: [facility] URL: [email or web address] Date: 01/30/2023 Prepared by: Lonni Gainer  Exercises - Shoulder extension with resistance - Neutral  - 1 x daily - 7 x weekly - 2 sets - 12 reps - Standing Shoulder Row with Anchored Resistance  - 1 x daily - 7 x weekly - 2 sets - 15 reps - Shoulder External Rotation and Scapular Retraction with Resistance  - 1 x daily - 7 x weekly - 2 sets - 10 reps - Seated Shoulder Horizontal Abduction with Resistance - Palms Down  - 1 x daily - 7 x weekly - 2 sets - 12 reps -Standing PWR! Moves x 10 ea (UP, ROCK, TWIST,STEP)  GOALS: Goals reviewed with patient? Yes  SHORT TERM GOALS: Target date: 04/09/2023    Patient will be independent in home exercise program to improve strength/mobility for better functional independence with ADLs. Baseline: No HEP currently  10/2: Was doing them but has not been lately  Goal status:MET     LONG TERM GOALS: Target date: 05/07/2023  1.  Patient will increase FOTO score to equal to or greater than  62   to demonstrate statistically significant improvement in mobility and quality of life.  Baseline: 53  10/2:52 11/20: 66 Goal status: MET   2.  Patient will increase Mini BEST Balance score by > 4  points to demonstrate decreased fall risk during functional activities. Baseline: 2111/20: 25  Goal status: MET   3.  Patient will improve LE muscle strength to > 4+/5 in hip, knee and ankle strength in order to  indicate return to prior level of LE strength  Baseline: see eval chart  Goal status: MET  4.  Patient will increase six minute walk test distance to >1100 with pain less than 5/10 throughout for progression to community ambulator and improve gait ability without discomfort Baseline: 890 ft in 5 minutes, througohut test but more profoundly at 4 minutes pt began to have stooped kyphotic posture. This worsened and was followed by back pain (6/10) that caused the test to be suspended at 5 minutes.  10/2: 990 ft with similar progression of flexed posture throughout. Pain 5/10 at 5:30  11/20: 750 ft, back pain starts, 600 ft kyphotic posture starts  Goal status: ONGOING  5.  Patient will improve 32nd chair stand to 14 repetitions or greater in order to indicate improved lower extremity strength and power as well as to improve his result to that of his age-matched norms. Baseline: 11/20: 10 Goal status: INITIAL    ASSESSMENT:  CLINICAL IMPRESSION:  Pt performed well with the exercises noting an increased ability to perform with increased resistance.  Pt noted to have some fatigue at the conclusion of the session, however performed well throughout.  Pt does still need some verbal cuing for proper form with exercises, such as the backwards lunge utilizing the TRX straps.   Pt will continue to benefit from skilled therapy to address remaining deficits in order to improve overall QoL and return to PLOF.  OBJECTIVE IMPAIRMENTS: decreased activity tolerance, decreased balance, decreased mobility, difficulty walking, and decreased strength.   ACTIVITY LIMITATIONS: standing, squatting, stairs, and locomotion level  PARTICIPATION LIMITATIONS: community activity  PERSONAL FACTORS: 3+ comorbidities: PD, HLD, HTN, Back pain  are also affecting patient's functional outcome.   REHAB POTENTIAL: Good  CLINICAL DECISION MAKING: Stable/uncomplicated  EVALUATION COMPLEXITY: Low  PLAN:  PT  FREQUENCY: 2x/week  PT DURATION: 6 weeks  PLANNED INTERVENTIONS: Therapeutic exercises, Therapeutic activity, Neuromuscular re-education, Balance training, Gait training, Patient/Family education, Self Care, Joint mobilization, Joint manipulation, Spinal manipulation, Spinal mobilization, Moist heat, and Manual therapy  PLAN FOR NEXT SESSION:    Continue Antigravity extension activities to improve posture with prolonged ambulation, PD specific exercises with balance focus and postural focus.    Fonda Simpers, PT, DPT Physical Therapist - [facility]  06/09/23, 1:13 PM

## 2023-06-12 ENCOUNTER — Ambulatory Visit: Payer: Medicare Other | Admitting: Physical Therapy

## 2023-06-16 ENCOUNTER — Ambulatory Visit: Payer: Medicare Other | Attending: Internal Medicine | Admitting: Physical Therapy

## 2023-06-16 DIAGNOSIS — R269 Unspecified abnormalities of gait and mobility: Secondary | ICD-10-CM | POA: Insufficient documentation

## 2023-06-16 DIAGNOSIS — R262 Difficulty in walking, not elsewhere classified: Secondary | ICD-10-CM | POA: Insufficient documentation

## 2023-06-16 DIAGNOSIS — R2689 Other abnormalities of gait and mobility: Secondary | ICD-10-CM | POA: Insufficient documentation

## 2023-06-16 DIAGNOSIS — M6281 Muscle weakness (generalized): Secondary | ICD-10-CM | POA: Insufficient documentation

## 2023-06-16 DIAGNOSIS — R2681 Unsteadiness on feet: Secondary | ICD-10-CM | POA: Diagnosis not present

## 2023-06-16 NOTE — Therapy (Signed)
 OUTPATIENT PHYSICAL THERAPY TREATMENT   Patient Name: Johnathan Arnold. MRN: [id number] DOB:14-Mar-1946, 78 y.o., male Today's Date: 06/16/2023   PCP: Marylynn Verneita CROME, MD  REFERRING PROVIDER: Marylynn Verneita CROME, MD   END OF SESSION:  PT End of Session - 06/16/23 1347     Visit Number 28    Number of Visits 35    Date for PT Re-Evaluation 06/24/23    Authorization Type Medicare, BCBS supplement    Progress Note Due on Visit 30    PT Start Time 1141    PT Stop Time 1225    PT Time Calculation (min) 44 min    Equipment Utilized During Treatment Gait belt    Activity Tolerance Patient tolerated treatment well;No increased pain    Behavior During Therapy [facility] for tasks assessed/performed               Past Medical History:  Diagnosis Date   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 LIMA to LAD, SVG to D1, OM1 and RPDA   CORONARY ARTERY BYPASS GRAFT  03/1998   5 vessel, [facility]   RIGHT HEART CATH N/A 04/04/2019   Procedure: RIGHT HEART CATH;  Surgeon: Darron Deatrice LABOR, MD;  Location: [facility] INVASIVE CV LAB;  Service: Cardiovascular;  Laterality: N/A;   RIGHT/LEFT HEART CATH AND CORONARY ANGIOGRAPHY N/A 02/01/2018   Procedure: RIGHT/LEFT HEART CATH AND CORONARY ANGIOGRAPHY;  Surgeon: Darron Deatrice LABOR, MD;  Location: [facility] INVASIVE CV LAB;  Service: Cardiovascular;  Laterality: N/A;   Patient Active Problem List   Diagnosis Date Noted   Orthostatic hypotension 05/22/2023   Syncope and collapse 05/22/2023   Adverse drug reaction 01/20/2023   History of COVID-19 03/18/2022   History of diabetes  mellitus, type II 03/18/2022   Hospital discharge follow-up 05/16/2021   Generalized weakness 05/07/2021   Fall    Head injury    Bradycardia    Anemia, unspecified 02/07/2021   Neutropenia (HCC) 01/29/2021   Thrombocytopenia (HCC) 01/29/2021   Hyponatremia 12/22/2020   Bilateral leg weakness 12/20/2020   Prostate cancer screening 09/08/2020   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) 09/06/2020   Low back pain 08/19/2019   Pulmonary hypertension (HCC)    Insomnia 12/21/2018   Sleep apnea in adult 12/09/2018   Periodic limb movement disorder 12/09/2018   Pulmonary nodules 05/18/2018   Wears hearing aid in both ears 05/17/2018   Leg pain, bilateral 02/20/2018   Dyspnea    CKD (chronic kidney disease) stage 3, GFR 30-59 ml/min (HCC) 09/21/2017   History of skin cancer in adulthood 08/14/2016   Parkinson's disease (HCC) 08/09/2016   Bilateral carotid artery stenosis 04/02/2015   Vertigo, peripheral 10/17/2014   Benign prostatic hyperplasia with urinary frequency 01/31/2014   Encounter for Medicare annual wellness exam 07/02/2013   Obesity 04/03/2013   Other malaise and fatigue 09/21/2012   Hyperlipidemia    Hypertension    3-vessel coronary artery disease    S/P CABG x 5     ONSET DATE: 01/20/23  REFERRING DIAG:  R29.898 (ICD-10-CM) - Weakness of both lower extremities   THERAPY DIAG:  Unsteadiness on feet  Difficulty in walking, not elsewhere classified  Other abnormalities of gait and mobility  Abnormality of gait and mobility  Muscle weakness (generalized)  Rationale for Evaluation and Treatment: Rehabilitation  SUBJECTIVE:                                                                                                                                                                                             SUBJECTIVE STATEMENT:  Pt denies any complications upon arrival.  Pt is doing well otherwise and is ready for the new year. Still feels  weakness in his quad muscles.  Pt accompanied by: self  PERTINENT HISTORY: PD, history of COVID and significant weakness present following this diagnosis and the use of paxlovid ( August 5 dx date, thinks Paxlovid may have exacerbated PD symptoms)   Pt reports having COVID and having significant weakness.  Patient reports he also has back pain that is not present for a long time but was exacerbated more recently.  Patient previously did physical therapy for his back and experienced some relief but has not been consistent with the exercises.  Patient also has history of going to Parkinson's rock steady classes multiple times per week prior to onset of his COVID but he has not been back since. Patient previously ambulated without an assistive device but is now ambulating with a straight point cane.  Patient reports increased foot and ankle weakness in comparison with his hips and knees.  Patient also reports low back pain that is exacerbated with prolonged standing.  PAIN:  Are you having pain? No pain   PRECAUTIONS: Fall  RED FLAGS: None   WEIGHT BEARING RESTRICTIONS: No  FALLS: Has patient fallen in last 6 months? Yes. Number of falls 2-3  LIVING ENVIRONMENT: Lives with: lives with their family and lives with their spouse Lives in: House/apartment Stairs: Yes: Internal: 15 steps; on left going up and External: 1 steps; none Has following equipment at home: Quad cane large base  PLOF: Independent  PATIENT GOALS: To improve strength to prior to his COVID diagnosis   OBJECTIVE:   TODAY'S TREATMENT: Date:  06/16/23   TherEx:  Octane x 3 min level 1 then x 5 min Leg press mode with HIIT focuses on LE strength   Step up to 6 in step holding 9# dumbells bilateally  2 x 10 reps   Sidestepping with resistance 2 x 2 laps with RTB around ankles  Monster walk 2 x 4 laps with RTB   Leg press 3x10 @ 55# plus  extra 7.5# extra plate on precor leg press   Forward lunge 2 x 16 reps  with UE support for balance   Donned 5# AW  LAQ 2x10 reps ea LE with 3 sec hold Standing heel raises with UE support, 2*15     PATIENT EDUCATION: Education details: POC .  Pt educated throughout session about proper posture and technique with exercises. Improved exercise technique, movement at target joints, use of target muscles after min to mod verbal, visual, tactile cues.  Person educated: Patient Education method: Explanation Education comprehension: verbalized understanding  HOME EXERCISE PROGRAM: Access Code: [facility] URL: [email or web address] Date: 01/30/2023 Prepared by: Lonni Gainer  Exercises - Shoulder extension with resistance - Neutral  - 1 x daily - 7 x weekly - 2 sets - 12 reps - Standing Shoulder Row with Anchored Resistance  - 1 x daily - 7 x weekly - 2 sets - 15 reps - Shoulder External Rotation and Scapular Retraction with Resistance  - 1 x daily - 7 x weekly - 2 sets - 10 reps - Seated Shoulder Horizontal Abduction with Resistance - Palms Down  - 1 x daily - 7 x weekly - 2 sets - 12 reps -Standing PWR! Moves x 10 ea (UP, ROCK, TWIST,STEP)  GOALS: Goals reviewed with patient? Yes  SHORT TERM GOALS: Target date: 04/09/2023    Patient will be independent in home exercise program to improve strength/mobility for better functional independence with ADLs. Baseline: No HEP currently  10/2: Was doing them but has not been lately  Goal status:MET     LONG TERM GOALS: Target date: 05/07/2023  1.  Patient will increase FOTO score to equal to or greater than  62   to demonstrate statistically significant improvement in mobility and quality of life.  Baseline: 53  10/2:52 11/20: 66 Goal status: MET   2.  Patient will increase Mini BEST Balance score by > 4  points to demonstrate decreased fall risk during functional activities. Baseline: 2111/20: 25  Goal status: MET   3.  Patient will improve LE muscle strength to > 4+/5 in hip, knee  and ankle strength in order to indicate return to prior level of LE strength  Baseline: see eval chart  Goal status: MET  4.  Patient will increase six minute walk test distance to >1100 with pain less than 5/10 throughout for progression to community ambulator and improve gait ability without discomfort Baseline: 890 ft in 5 minutes, througohut test but more profoundly at 4 minutes pt began to have stooped kyphotic posture. This worsened and was followed by back pain (6/10) that caused the test to be suspended at 5 minutes.  10/2: 990 ft with similar progression of flexed posture throughout. Pain 5/10 at 5:30  11/20: 750 ft, back pain starts, 600 ft kyphotic posture starts  Goal status: ONGOING  5.  Patient will improve 30 second chair stand to 14 repetitions or greater in order to indicate improved lower extremity strength and power as well as to improve his result to that of his age-matched norms. Baseline: 11/20: 10 Goal status: INITIAL    ASSESSMENT:  CLINICAL IMPRESSION:  Pt performed well with the exercises noting an increased ability to perform with increased resistance.  Physical therapy session continues to focus on lower extremity strengthening per patient's report of still having lower extremity weakness.  Pt will continue to benefit from skilled therapy to address remaining deficits in order to improve overall QoL and return to PLOF.  OBJECTIVE IMPAIRMENTS: decreased activity tolerance, decreased balance, decreased mobility, difficulty walking, and decreased strength.   ACTIVITY LIMITATIONS: standing, squatting, stairs, and locomotion level  PARTICIPATION LIMITATIONS: community activity  PERSONAL FACTORS: 3+ comorbidities: PD, HLD, HTN, Back pain  are also affecting patient's functional outcome.   REHAB POTENTIAL: Good  CLINICAL DECISION MAKING: Stable/uncomplicated  EVALUATION COMPLEXITY: Low  PLAN:  PT FREQUENCY: 2x/week  PT DURATION: 6 weeks  PLANNED  INTERVENTIONS: Therapeutic exercises, Therapeutic activity, Neuromuscular re-education, Balance training, Gait training, Patient/Family education, Self Care, Joint mobilization, Joint manipulation, Spinal manipulation, Spinal mobilization, Moist heat, and Manual therapy  PLAN FOR NEXT SESSION:    Continue Antigravity extension activities to improve posture with prolonged ambulation, PD specific exercises with balance focus and postural focus.   Lonni KATHEE Gainer PT ,DPT Physical Therapist- [facility]   06/16/23, 1:48 PM

## 2023-06-18 ENCOUNTER — Ambulatory Visit: Payer: Medicare Other | Admitting: Physical Therapy

## 2023-06-18 ENCOUNTER — Ambulatory Visit (INDEPENDENT_AMBULATORY_CARE_PROVIDER_SITE_OTHER): Payer: Medicare Other | Admitting: Neurology

## 2023-06-18 VITALS — BP 122/62 | HR 77 | Ht 66.0 in | Wt 163.4 lb

## 2023-06-18 DIAGNOSIS — R269 Unspecified abnormalities of gait and mobility: Secondary | ICD-10-CM | POA: Diagnosis not present

## 2023-06-18 DIAGNOSIS — M545 Low back pain, unspecified: Secondary | ICD-10-CM | POA: Diagnosis not present

## 2023-06-18 DIAGNOSIS — R262 Difficulty in walking, not elsewhere classified: Secondary | ICD-10-CM | POA: Diagnosis not present

## 2023-06-18 DIAGNOSIS — M6281 Muscle weakness (generalized): Secondary | ICD-10-CM | POA: Diagnosis not present

## 2023-06-18 DIAGNOSIS — G20A1 Parkinson's disease without dyskinesia, without mention of fluctuations: Secondary | ICD-10-CM

## 2023-06-18 DIAGNOSIS — R2681 Unsteadiness on feet: Secondary | ICD-10-CM | POA: Diagnosis not present

## 2023-06-18 DIAGNOSIS — R2689 Other abnormalities of gait and mobility: Secondary | ICD-10-CM | POA: Diagnosis not present

## 2023-06-18 DIAGNOSIS — G903 Multi-system degeneration of the autonomic nervous system: Secondary | ICD-10-CM

## 2023-06-18 MED ORDER — ENTACAPONE 200 MG PO TABS: ORAL_TABLET | ORAL | Status: DC

## 2023-06-18 NOTE — Progress Notes (Signed)
 Assessment/Plan:   1.  Parkinsons Disease, with worsening of symptoms when he has had systemic illness  -Patient's Parkinson's really looks adequately controlled today.  -Continue carbidopa /levodopa  25/100 to 2 at 8am, 2 at 11am, 2 at 2 pm, 1 at 5pm  -add entacapone  200 mg , 1 at 8am and 11am.  He actually already had a prescription at home from when we prescribed this a long time ago and he had not yet taken it.  -Continue carbidopa /levodopa  50/200 CR at bedtime.  -use walker at all times.  -Long discussion with the patient today regarding Parkinson's disease and his response to illness.  Patient often will have worsening of symptoms of Parkinson's disease as he develops other illnesses.  However, we discussed that this is not the Parkinson's disease itself getting worse, but rather its reaction to other illnesses.  He always gets better as the other illnesses get better.  Today, he primarily just looks deconditioned.  I am happy to see that he is back in physical therapy.  -We discussed that it used to be thought that levodopa  would increase risk of melanoma but now it is believed that Parkinsons itself likely increases risk of melanoma. he is to get regular skin checks.  He is doing this.  He sees Dr. Dela  2.  RLS/RBD  -Continue clonazepam  1 mg q hs.  Understands addictive postential.  PDMP reviewed and refills provided  -he has bed rails now and uses them  -melatonin 3 mg at bedtime.  3.  Sleep apnea  -On CPAP.  4.  Pulmonary hypertension  -Follows with cardiology and pulmonary  5  MCI  -pt with neurocog testing in Jan, 2022 and demonstrated only MCI.  No evidence of a mood disorder.  I do not suspect that this has gotten worse.  6.  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  -Patient off of amlodipine .  -Patient  restarted losartan  last week.  It is unclear why, but he reports that he does not feel worse taking it.  Only takes the Lasix  as needed.  Is taking Imdur  daily.  Cardiology and primary care are working together with me for allowing for permissive hypertension.  -Patient with syncope/near syncope in primary care office December 13.  Patient had been sick with vomiting and diarrheal illness and stood up to go to the lab and had the episode.  This was likely due to dehydration associated with orthostasis.  7.  Carotid bruit, left  -u/s on 10/11 was normal  8.  LBP with hyperreflexia  -do MRI lumbar spine    Subjective:   Johnathan Arnold. was seen today in follow up for Parkinsons disease.  My previous records were reviewed prior to todays visit as well as outside records available to me. pts wife and son with patient and supplements the hx. patient worked in today.  Patient last seen at the end of October.  Patient had been ill and went to his primary care physician because of nausea and vomiting illness during the second week of December.  He also had loose stools.  While in the primary care office being examined, the patient stood up to go to the lab and became syncopal due to severe dehydration.  They ended up sending him to the emergency room for evaluation.  However, the patient did not end up  waiting to be seen.  He is currently in physical therapy.  He reports that since that time, he has felt weak and shaky.  Not really feeling dizzy but he's not taking his BP.  He did take it this AM and it was high.  He isn't taking the lasix .  He did restart the losarten back last week - I don't know why I started it back but thought maybe I needed to.  No confusion/hallucinations.  Having a lot of back pain.  Thinks that is part of the issue.  States that he will have back pain and then he will start to bend forward, made worse by the Parkinson's disease.  Current prescribed movement disorder  medications: Carbidopa /levodopa  25/100, 2/2/2/1 Carbidopa /levodopa  50/200 CR at bedtime  Clonazepam , 1 mg at bed  Prior meds:  entacapone  (given but not taken)    ALLERGIES:   Allergies  Allergen Reactions   Paxlovid [Nirmatrelvir-Ritonavir]     CURRENT MEDICATIONS:  Outpatient Encounter Medications as of 06/18/2023  Medication Sig   acetaminophen  (TYLENOL ) 500 MG tablet Take 500 mg by mouth as needed.   aspirin  EC 81 MG tablet Take 1 tablet (81 mg total) by mouth at bedtime.   carbidopa -levodopa  (SINEMET  CR) 50-200 MG tablet TAKE 1 TABLET BY MOUTH AT BEDTIME   carbidopa -levodopa  (SINEMET  IR) 25-100 MG tablet TAKE 2 TABLETS BY MOUTH AT 8AM; TAKE 2 TABLETS BY MOUTH AT 11AM; TAKE 2 TABLETS BY MOUTH AT 2PM.; TAKE 1 TABLET BY MOUTH AT 5PM.   clonazePAM  (KLONOPIN ) 1 MG tablet Take 1 tablet (1 mg total) by mouth at bedtime.   furosemide  (LASIX ) 20 MG tablet Take 1 tablet (20 mg total) by mouth daily as needed (for swelling).   isosorbide  mononitrate (IMDUR ) 60 MG 24 hr tablet TAKE 1 TABLET BY MOUTH DAILY   losartan  (COZAAR ) 25 MG tablet Take 1 tablet (25 mg total) by mouth daily as needed (at night for systolic greater than 140).   melatonin 3 MG TABS tablet Take 1.5 mg by mouth at bedtime as needed.   Multiple Vitamin (MULTIVITAMIN) tablet Take 1 tablet by mouth daily.   nitroGLYCERIN  (NITROSTAT ) 0.4 MG SL tablet Place 1 tablet (0.4 mg total) under the tongue every 5 (five) minutes as needed for chest pain.   omeprazole  (PRILOSEC) 20 MG capsule Take 1 capsule (20 mg total) by mouth every morning.   polyethylene glycol (MIRALAX  / GLYCOLAX ) packet Take 17 g by mouth daily as needed.   ranolazine  (RANEXA ) 1000 MG SR tablet TAKE 1 TABLET BY MOUTH 2 TIMES A DAY   rosuvastatin  (CRESTOR ) 10 MG tablet TAKE 1 TABLET BY MOUTH DAILY   tamsulosin  (FLOMAX ) 0.4 MG CAPS capsule TAKE ONE CAPSULE BY MOUTH DAILY   [DISCONTINUED] diazepam  (VALIUM ) 5 MG tablet Take 5 mg by mouth 3 (three) times daily as  needed.   [DISCONTINUED] ondansetron  (ZOFRAN ) 4 MG tablet Take 1 tablet (4 mg total) by mouth every 8 (eight) hours as needed for nausea or vomiting.   No facility-administered encounter medications on file as of 06/18/2023.    Objective:   PHYSICAL EXAMINATION:    VITALS:   Vitals:   06/18/23 1457  BP: 122/62  Pulse: 77  SpO2: 98%  Weight: 163 lb 6.4 oz (74.1 kg)  Height: 5' 6 (1.676 m)     No data found.  GEN:  The patient appears stated age and is in NAD. HEENT:  Normocephalic, atraumatic.  The mucous membranes are moist. The superficial temporal arteries are  without ropiness or tenderness.  CV:  [place].  regular Lungs: CTAB Neck:  there is L carotid bruit  Neurological examination:  Orientation: The patient is alert and oriented x3. Cranial nerves: There is good facial symmetry with minimal facial hypomimia. The speech is fluent and clear. Soft palate rises symmetrically and there is no tongue deviation. Hearing is slightly decreased to conversational tone. Sensation: Sensation is intact to light touch throughout Motor: Strength is 5/5 in the bilateral UE/LE DTR;s 2/4 at the bilateral biceps, triceps, brachioradialis, 2+-3- at the bilateral patella with cross adductor reflexes.    Movement examination: Tone: There is nl tone in the ue/le Abnormal movements: there is intermittent RUE rest tremor Coordination:  There is no decremation with RAM's, with any form of RAMS, including alternating supination and pronation of the forearm, hand opening and closing, finger taps, heel taps and toe taps. Gait and Station: The patient has difficulty arising out of a deep-seated chair without the use of the hands.  He makes several unsuccessful attempts and then pushes off with the use of his hands.  He is leaning to the left.  He slightly drags the left leg.  He gets back to the chair and sits down and then tries again to get up without the use of his hands and it is successful at this  time.    Total time spent on today's visit was 40 minutes, including both face-to-face time and nonface-to-face time.  Time included that spent on review of records (prior notes available to me/labs/imaging if pertinent), discussing treatment and goals, answering patient's questions and coordinating care.    Cc:  Marylynn Verneita CROME, MD

## 2023-06-18 NOTE — Therapy (Signed)
 OUTPATIENT PHYSICAL THERAPY TREATMENT   Patient Name: Johnathan Arnold. MRN: [id number] DOB:1945/09/30, 78 y.o., male Today's Date: 06/18/2023   PCP: Marylynn Verneita CROME, MD  REFERRING PROVIDER: Marylynn Verneita CROME, MD   END OF SESSION:  PT End of Session - 06/18/23 1152     Visit Number 29    Number of Visits 35    Date for PT Re-Evaluation 06/24/23    Authorization Type Medicare, BCBS supplement    Progress Note Due on Visit 30    PT Start Time 1145    PT Stop Time 1227    PT Time Calculation (min) 42 min    Equipment Utilized During Treatment Gait belt    Activity Tolerance Patient tolerated treatment well;No increased pain    Behavior During Therapy [facility] for tasks assessed/performed               Past Medical History:  Diagnosis Date   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 LIMA to LAD, SVG to D1, OM1 and RPDA   CORONARY ARTERY BYPASS GRAFT  03/1998   5 vessel, [facility]   RIGHT HEART CATH N/A 04/04/2019   Procedure: RIGHT HEART CATH;  Surgeon: Darron Deatrice LABOR, MD;  Location: [facility] INVASIVE CV LAB;  Service: Cardiovascular;  Laterality: N/A;   RIGHT/LEFT HEART CATH AND CORONARY ANGIOGRAPHY N/A 02/01/2018   Procedure: RIGHT/LEFT HEART CATH AND CORONARY ANGIOGRAPHY;  Surgeon: Darron Deatrice LABOR, MD;  Location: [facility] INVASIVE CV LAB;  Service: Cardiovascular;  Laterality: N/A;   Patient Active Problem List   Diagnosis Date Noted   Orthostatic hypotension 05/22/2023   Syncope and collapse 05/22/2023   Adverse drug reaction 01/20/2023   History of COVID-19 03/18/2022   History of diabetes  mellitus, type II 03/18/2022   Hospital discharge follow-up 05/16/2021   Generalized weakness 05/07/2021   Fall    Head injury    Bradycardia    Anemia, unspecified 02/07/2021   Neutropenia (HCC) 01/29/2021   Thrombocytopenia (HCC) 01/29/2021   Hyponatremia 12/22/2020   Bilateral leg weakness 12/20/2020   Prostate cancer screening 09/08/2020   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) 09/06/2020   Low back pain 08/19/2019   Pulmonary hypertension (HCC)    Insomnia 12/21/2018   Sleep apnea in adult 12/09/2018   Periodic limb movement disorder 12/09/2018   Pulmonary nodules 05/18/2018   Wears hearing aid in both ears 05/17/2018   Leg pain, bilateral 02/20/2018   Dyspnea    CKD (chronic kidney disease) stage 3, GFR 30-59 ml/min (HCC) 09/21/2017   History of skin cancer in adulthood 08/14/2016   Parkinson's disease (HCC) 08/09/2016   Bilateral carotid artery stenosis 04/02/2015   Vertigo, peripheral 10/17/2014   Benign prostatic hyperplasia with urinary frequency 01/31/2014   Encounter for Medicare annual wellness exam 07/02/2013   Obesity 04/03/2013   Other malaise and fatigue 09/21/2012   Hyperlipidemia    Hypertension    3-vessel coronary artery disease    S/P CABG x 5     ONSET DATE: 01/20/23  REFERRING DIAG:  R29.898 (ICD-10-CM) - Weakness of both lower extremities   THERAPY DIAG:  Unsteadiness on feet  Difficulty in walking, not elsewhere classified  Other abnormalities of gait and mobility  Abnormality of gait and mobility  Muscle weakness (generalized)  Rationale for Evaluation and Treatment: Rehabilitation  SUBJECTIVE:                                                                                                                                                                                             SUBJECTIVE STATEMENT:  Pt denies any complications upon arrival.  Still feels weakness in his quad muscles.  Pt accompanied by:  self  PERTINENT HISTORY: PD, history of COVID and significant weakness present following this diagnosis and the use of paxlovid ( August 5 dx date, thinks Paxlovid may have exacerbated PD symptoms)   Pt reports having COVID and having significant weakness.  Patient reports he also has back pain that is not present for a long time but was exacerbated more recently.  Patient previously did physical therapy for his back and experienced some relief but has not been consistent with the exercises.  Patient also has history of going to Parkinson's rock steady classes multiple times per week prior to onset of his COVID but he has not been back since. Patient previously ambulated without an assistive device but is now ambulating with a straight point cane.  Patient reports increased foot and ankle weakness in comparison with his hips and knees.  Patient also reports low back pain that is exacerbated with prolonged standing.  PAIN:  Are you having pain? No pain   PRECAUTIONS: Fall  RED FLAGS: None   WEIGHT BEARING RESTRICTIONS: No  FALLS: Has patient fallen in last 6 months? Yes. Number of falls 2-3  LIVING ENVIRONMENT: Lives with: lives with their family and lives with their spouse Lives in: House/apartment Stairs: Yes: Internal: 15 steps; on left going up and External: 1 steps; none Has following equipment at home: Quad cane large base  PLOF: Independent  PATIENT GOALS: To improve strength to prior to his COVID diagnosis   OBJECTIVE:   TODAY'S TREATMENT: Date:  06/18/23   TherEx:  Octane x 3 min level 1 then x 5 min Leg press mode with HIIT focuses on LE strength   Step up to 6 in step holding 9# dumbells bilateally   x 10 reps ea LE, imbalance on the R LE  BP assessment 149/ 59 HR 58  Sidestepping with resistance 2 rounds of 12 sidesteps to each side laps with RTB around ankles  Leg press 3x12 @ 55# plus extra 7.5# extra plate  on precor leg press   Donned 5# AW  LAQ x12 reps  ea LE with 3 sec hold Standing heel raises with UE support, 2*15 Standing toe raises 2 x 15 Seated 1/2 bolster step over 2 x 10 ea side ( hip flex/add/abd challenge)     PATIENT EDUCATION: Education details: POC .  Pt educated throughout session about proper posture and technique with exercises. Improved exercise technique, movement at target joints, use of target muscles after min to mod verbal, visual, tactile cues.  Person educated: Patient Education method: Explanation Education comprehension: verbalized understanding  HOME EXERCISE PROGRAM: Access Code: [facility] URL: [email or web address] Date: 01/30/2023 Prepared by: Lonni Gainer  Exercises - Shoulder extension with resistance - Neutral  - 1 x daily - 7 x weekly - 2 sets - 12 reps - Standing Shoulder Row with Anchored Resistance  - 1 x daily - 7 x weekly - 2 sets - 15 reps - Shoulder External Rotation and Scapular Retraction with Resistance  - 1 x daily - 7 x weekly - 2 sets - 10 reps - Seated Shoulder Horizontal Abduction with Resistance - Palms Down  - 1 x daily - 7 x weekly - 2 sets - 12 reps -Standing PWR! Moves x 10 ea (UP, ROCK, TWIST,STEP)  GOALS: Goals reviewed with patient? Yes  SHORT TERM GOALS: Target date: 04/09/2023    Patient will be independent in home exercise program to improve strength/mobility for better functional independence with ADLs. Baseline: No HEP currently  10/2: Was doing them but has not been lately  Goal status:MET     LONG TERM GOALS: Target date: 05/07/2023  1.  Patient will increase FOTO score to equal to or greater than  62   to demonstrate statistically significant improvement in mobility and quality of life.  Baseline: 53  10/2:52 11/20: 66 Goal status: MET   2.  Patient will increase Mini BEST Balance score by > 4  points to demonstrate decreased fall risk during functional activities. Baseline: 2111/20: 25  Goal status: MET   3.  Patient will improve  LE muscle strength to > 4+/5 in hip, knee and ankle strength in order to indicate return to prior level of LE strength  Baseline: see eval chart  Goal status: MET  4.  Patient will increase six minute walk test distance to >1100 with pain less than 5/10 throughout for progression to community ambulator and improve gait ability without discomfort Baseline: 890 ft in 5 minutes, througohut test but more profoundly at 4 minutes pt began to have stooped kyphotic posture. This worsened and was followed by back pain (6/10) that caused the test to be suspended at 5 minutes.  10/2: 990 ft with similar progression of flexed posture throughout. Pain 5/10 at 5:30  11/20: 750 ft, back pain starts, 600 ft kyphotic posture starts  Goal status: ONGOING  5.  Patient will improve 30 second chair stand to 14 repetitions or greater in order to indicate improved lower extremity strength and power as well as to improve his result to that of his age-matched norms. Baseline: 11/20: 10 Goal status: INITIAL    ASSESSMENT:  CLINICAL IMPRESSION:  Pt performed well with the exercises noting an increased ability to perform with increased resistance.  Physical therapy session continues to focus on lower extremity strengthening per patient's report of still having lower extremity weakness. Goals will be assessed next week for progress toward strength goal of patient. Pt will continue to benefit from skilled therapy to address  remaining deficits in order to improve overall QoL and return to PLOF.       OBJECTIVE IMPAIRMENTS: decreased activity tolerance, decreased balance, decreased mobility, difficulty walking, and decreased strength.   ACTIVITY LIMITATIONS: standing, squatting, stairs, and locomotion level  PARTICIPATION LIMITATIONS: community activity  PERSONAL FACTORS: 3+ comorbidities: PD, HLD, HTN, Back pain  are also affecting patient's functional outcome.   REHAB POTENTIAL: Good  CLINICAL DECISION MAKING:  Stable/uncomplicated  EVALUATION COMPLEXITY: Low  PLAN:  PT FREQUENCY: 2x/week  PT DURATION: 6 weeks  PLANNED INTERVENTIONS: Therapeutic exercises, Therapeutic activity, Neuromuscular re-education, Balance training, Gait training, Patient/Family education, Self Care, Joint mobilization, Joint manipulation, Spinal manipulation, Spinal mobilization, Moist heat, and Manual therapy  PLAN FOR NEXT SESSION:    Continue Antigravity extension activities to improve posture with prolonged ambulation, PD specific exercises with balance focus and postural focus.   Lonni KATHEE Gainer PT ,DPT Physical Therapist- [facility]   06/18/23, 11:53 AM

## 2023-06-18 NOTE — Patient Instructions (Signed)
 Increase your water intake! Use your walker! Take entacapone  200 mg, 1 at 8am and 1 with your 11am dose of levodopa  Continue with your physical therapy.  Once PT is done, you need to continue with physical exercise We ordered an MRI lumbar spine  The physicians and staff at [facility] are committed to providing excellent care. You may receive a survey requesting feedback about your experience at our office. We strive to receive very good responses to the survey questions. If you feel that your experience would prevent you from giving the office a very good  response, please contact our office to try to remedy the situation. We may be reached at [phone]. Thank you for taking the time out of your busy day to complete the survey.

## 2023-06-23 ENCOUNTER — Ambulatory Visit: Payer: Medicare Other | Admitting: Physical Therapy

## 2023-06-23 ENCOUNTER — Encounter: Payer: Self-pay | Admitting: Neurology

## 2023-06-23 DIAGNOSIS — R269 Unspecified abnormalities of gait and mobility: Secondary | ICD-10-CM

## 2023-06-23 DIAGNOSIS — R2681 Unsteadiness on feet: Secondary | ICD-10-CM | POA: Diagnosis not present

## 2023-06-23 DIAGNOSIS — M6281 Muscle weakness (generalized): Secondary | ICD-10-CM | POA: Diagnosis not present

## 2023-06-23 DIAGNOSIS — R262 Difficulty in walking, not elsewhere classified: Secondary | ICD-10-CM | POA: Diagnosis not present

## 2023-06-23 DIAGNOSIS — R2689 Other abnormalities of gait and mobility: Secondary | ICD-10-CM | POA: Diagnosis not present

## 2023-06-23 NOTE — Therapy (Signed)
 OUTPATIENT PHYSICAL THERAPY TREATMENT/ Physical Therapy Progress Note   Dates of reporting period  04/29/23   to   06/23/23    Patient Name: Johnathan Arnold. MRN: [id number] DOB:May 19, 1946, 78 y.o., male Today's Date: 06/23/2023   PCP: Marylynn Verneita CROME, MD  REFERRING PROVIDER: Marylynn Verneita CROME, MD   END OF SESSION:  PT End of Session - 06/23/23 1107     Visit Number 30    Number of Visits 35    Date for PT Re-Evaluation 06/24/23    Authorization Type Medicare, BCBS supplement    Progress Note Due on Visit 30    PT Start Time 1100    PT Stop Time 1144    PT Time Calculation (min) 44 min    Equipment Utilized During Treatment Gait belt    Activity Tolerance Patient tolerated treatment well;No increased pain    Behavior During Therapy [facility] for tasks assessed/performed               Past Medical History:  Diagnosis Date   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 LIMA to LAD, SVG to D1, OM1 and RPDA   CORONARY ARTERY BYPASS GRAFT  03/1998   5 vessel, [facility]   RIGHT HEART CATH N/A 04/04/2019   Procedure: RIGHT HEART CATH;  Surgeon: Darron Deatrice LABOR, MD;  Location: [facility] INVASIVE CV LAB;  Service: Cardiovascular;  Laterality: N/A;   RIGHT/LEFT HEART CATH AND CORONARY ANGIOGRAPHY N/A 02/01/2018   Procedure: RIGHT/LEFT HEART CATH AND CORONARY ANGIOGRAPHY;  Surgeon: Darron Deatrice LABOR, MD;  Location: [facility] INVASIVE CV LAB;  Service: Cardiovascular;  Laterality: N/A;   Patient Active Problem List   Diagnosis Date Noted   Orthostatic hypotension 05/22/2023   Syncope and collapse 05/22/2023    Adverse drug reaction 01/20/2023   History of COVID-19 03/18/2022   History of diabetes mellitus, type II 03/18/2022   Hospital discharge follow-up 05/16/2021   Generalized weakness 05/07/2021   Fall    Head injury    Bradycardia    Anemia, unspecified 02/07/2021   Neutropenia (HCC) 01/29/2021   Thrombocytopenia (HCC) 01/29/2021   Hyponatremia 12/22/2020   Bilateral leg weakness 12/20/2020   Prostate cancer screening 09/08/2020   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) 09/06/2020   Low back pain 08/19/2019   Pulmonary hypertension (HCC)    Insomnia 12/21/2018   Sleep apnea in adult 12/09/2018   Periodic limb movement disorder 12/09/2018   Pulmonary nodules 05/18/2018   Wears hearing aid in both ears 05/17/2018   Leg pain, bilateral 02/20/2018   Dyspnea    CKD (chronic kidney disease) stage 3, GFR 30-59 ml/min (HCC) 09/21/2017   History of skin cancer in adulthood 08/14/2016   Parkinson's disease (HCC) 08/09/2016   Bilateral carotid artery stenosis 04/02/2015   Vertigo, peripheral 10/17/2014   Benign prostatic hyperplasia with urinary frequency 01/31/2014   Encounter for Medicare annual wellness exam 07/02/2013   Obesity 04/03/2013   Other malaise and fatigue 09/21/2012   Hyperlipidemia    Hypertension    3-vessel coronary  artery disease    S/P CABG x 5     ONSET DATE: 01/20/23  REFERRING DIAG: M70.101 (ICD-10-CM) - Weakness of both lower extremities   THERAPY DIAG:  Unsteadiness on feet  Difficulty in walking, not elsewhere classified  Other abnormalities of gait and mobility  Abnormality of gait and mobility  Muscle weakness (generalized)  Rationale for Evaluation and Treatment: Rehabilitation  SUBJECTIVE:                                                                                                                                                                                             SUBJECTIVE STATEMENT:  Pt and caregiver  report weakness since last week. Seems to correlate with recent med change and pt was implored to inform his prescribing doctor of these symptoms so modifications may be made if necessary.   Pt accompanied by: self  PERTINENT HISTORY: PD, history of COVID and significant weakness present following this diagnosis and the use of paxlovid ( August 5 dx date, thinks Paxlovid may have exacerbated PD symptoms)   Pt reports having COVID and having significant weakness.  Patient reports he also has back pain that is not present for a long time but was exacerbated more recently.  Patient previously did physical therapy for his back and experienced some relief but has not been consistent with the exercises.  Patient also has history of going to Parkinson's rock steady classes multiple times per week prior to onset of his COVID but he has not been back since. Patient previously ambulated without an assistive device but is now ambulating with a straight point cane.  Patient reports increased foot and ankle weakness in comparison with his hips and knees.  Patient also reports low back pain that is exacerbated with prolonged standing.  PAIN:  Are you having pain? No pain   PRECAUTIONS: Fall  RED FLAGS: None   WEIGHT BEARING RESTRICTIONS: No  FALLS: Has patient fallen in last 6 months? Yes. Number of falls 2-3  LIVING ENVIRONMENT: Lives with: lives with their family and lives with their spouse Lives in: House/apartment Stairs: Yes: Internal: 15 steps; on left going up and External: 1 steps; none Has following equipment at home: Quad cane large base  PLOF: Independent  PATIENT GOALS: To improve strength to prior to his COVID diagnosis   OBJECTIVE:   TODAY'S TREATMENT: Date:  06/23/23  Began with BP assessment BP: 138/60  TherEx: Pt and caregiver report he is feeling weak today and has felt weak for several days. Pt led through more light version of his HEP compared to last sessions. not  assessed  but 30 second chair stand assessed and pt has some regression. Pt instructed to check with his MD regarding this.   Nustep level 2 x 6 min for reciprocal B UE an dLE movements.   Step up to 6 in step  2 x 10 with UE support  Donned # AW  LAQ 2x12 reps ea LE with 3 sec hold Seated march 2 x 12 with 3# AW      PATIENT EDUCATION: Education details: POC .  Pt educated throughout session about proper posture and technique with exercises. Improved exercise technique, movement at target joints, use of target muscles after min to mod verbal, visual, tactile cues.  Person educated: Patient Education method: Explanation Education comprehension: verbalized understanding  HOME EXERCISE PROGRAM: Access Code: [facility] URL: [email or web address] Date: 01/30/2023 Prepared by: Lonni Gainer  Exercises - Shoulder extension with resistance - Neutral  - 1 x daily - 7 x weekly - 2 sets - 12 reps - Standing Shoulder Row with Anchored Resistance  - 1 x daily - 7 x weekly - 2 sets - 15 reps - Shoulder External Rotation and Scapular Retraction with Resistance  - 1 x daily - 7 x weekly - 2 sets - 10 reps - Seated Shoulder Horizontal Abduction with Resistance - Palms Down  - 1 x daily - 7 x weekly - 2 sets - 12 reps -Standing PWR! Moves x 10 ea (UP, ROCK, TWIST,STEP)  GOALS: Goals reviewed with patient? Yes  SHORT TERM GOALS: Target date: 04/09/2023    Patient will be independent in home exercise program to improve strength/mobility for better functional independence with ADLs. Baseline: No HEP currently  10/2: Was doing them but has not been lately  Goal status:MET     LONG TERM GOALS: Target date: 05/07/2023  1.  Patient will increase FOTO score to equal to or greater than  62   to demonstrate statistically significant improvement in mobility and quality of life.  Baseline: 53  10/2:52 11/20: 66 Goal status: MET   2.  Patient will increase Mini BEST Balance  score by > 4  points to demonstrate decreased fall risk during functional activities. Baseline: 2111/20: 25  Goal status: MET   3.  Patient will improve LE muscle strength to > 4+/5 in hip, knee and ankle strength in order to indicate return to prior level of LE strength  Baseline: see eval chart  Goal status: MET  4.  Patient will increase six minute walk test distance to >1100 with pain less than 5/10 throughout for progression to community ambulator and improve gait ability without discomfort Baseline: 890 ft in 5 minutes, througohut test but more profoundly at 4 minutes pt began to have stooped kyphotic posture. This worsened and was followed by back pain (6/10) that caused the test to be suspended at 5 minutes.  10/2: 990 ft with similar progression of flexed posture throughout. Pain 5/10 at 5:30  11/20: 750 ft, back pain starts, 600 ft kyphotic posture starts  Goal status: ONGOING  5.  Patient will improve 30 second chair stand to 14 repetitions or greater in order to indicate improved lower extremity strength and power as well as to improve his result to that of his age-matched norms. Baseline: 11/20: 10 1/14:8 ( pt feeling weak at start of session, has been feeling off since new medicine) Goal status: INITIAL    ASSESSMENT:  CLINICAL IMPRESSION:  Patient presents for progress note this date.  Patient feeling very overall weak this date and  may be having a new symptom from new Parkinson's medication he started last week.  Patient instructed to inform his doctor of these changes.  Only assessed 1 goal this date and patient showed decreased strength compared to last assessment.  Patient was showing progress prior to his last session where he was demonstrating similar signs of weakness.  Patient's condition has the potential to improve in response to therapy. Maximum improvement is yet to be obtained. The anticipated improvement is attainable and reasonable in a generally predictable  time. Pt will continue to benefit from skilled therapy to address remaining deficits in order to improve overall QoL and return to PLOF.       OBJECTIVE IMPAIRMENTS: decreased activity tolerance, decreased balance, decreased mobility, difficulty walking, and decreased strength.   ACTIVITY LIMITATIONS: standing, squatting, stairs, and locomotion level  PARTICIPATION LIMITATIONS: community activity  PERSONAL FACTORS: 3+ comorbidities: PD, HLD, HTN, Back pain  are also affecting patient's functional outcome.   REHAB POTENTIAL: Good  CLINICAL DECISION MAKING: Stable/uncomplicated  EVALUATION COMPLEXITY: Low  PLAN:  PT FREQUENCY: 2x/week  PT DURATION: 6 weeks  PLANNED INTERVENTIONS: Therapeutic exercises, Therapeutic activity, Neuromuscular re-education, Balance training, Gait training, Patient/Family education, Self Care, Joint mobilization, Joint manipulation, Spinal manipulation, Spinal mobilization, Moist heat, and Manual therapy  PLAN FOR NEXT SESSION:    Continue Antigravity extension activities to improve posture with prolonged ambulation, PD specific exercises with balance focus and postural focus.   Lonni KATHEE Gainer PT ,DPT Physical Therapist- [facility]   06/23/23, 1:49 PM

## 2023-06-25 ENCOUNTER — Ambulatory Visit: Payer: Medicare Other | Admitting: Physical Therapy

## 2023-06-25 ENCOUNTER — Telehealth: Payer: Self-pay | Admitting: Physical Therapy

## 2023-06-25 NOTE — Telephone Encounter (Signed)
Pt contacted via telephone and author left voice mail informing of missed appointment and informed pt of future PT appointment date and time.   Christopher Byrd PT, DPT   

## 2023-06-30 ENCOUNTER — Ambulatory Visit: Payer: Medicare Other | Admitting: Physical Therapy

## 2023-07-02 ENCOUNTER — Other Ambulatory Visit: Payer: Self-pay

## 2023-07-02 ENCOUNTER — Inpatient Hospital Stay
Admission: EM | Admit: 2023-07-02 | Discharge: 2023-07-13 | DRG: 690 | Disposition: A | Discharge status: Skilled Nursing Facility | Payer: Medicare Other | Attending: Internal Medicine | Admitting: Internal Medicine

## 2023-07-02 ENCOUNTER — Encounter: Payer: Self-pay | Admitting: Emergency Medicine

## 2023-07-02 ENCOUNTER — Ambulatory Visit: Payer: Medicare Other | Admitting: Physical Therapy

## 2023-07-02 ENCOUNTER — Emergency Department: Payer: Medicare Other

## 2023-07-02 DIAGNOSIS — R262 Difficulty in walking, not elsewhere classified: Secondary | ICD-10-CM | POA: Diagnosis not present

## 2023-07-02 DIAGNOSIS — N401 Enlarged prostate with lower urinary tract symptoms: Secondary | ICD-10-CM | POA: Diagnosis not present

## 2023-07-02 DIAGNOSIS — N179 Acute kidney failure, unspecified: Secondary | ICD-10-CM | POA: Diagnosis not present

## 2023-07-02 DIAGNOSIS — R001 Bradycardia, unspecified: Secondary | ICD-10-CM | POA: Diagnosis not present

## 2023-07-02 DIAGNOSIS — E1122 Type 2 diabetes mellitus with diabetic chronic kidney disease: Secondary | ICD-10-CM | POA: Diagnosis present

## 2023-07-02 DIAGNOSIS — E7800 Pure hypercholesterolemia, unspecified: Secondary | ICD-10-CM | POA: Diagnosis not present

## 2023-07-02 DIAGNOSIS — R251 Tremor, unspecified: Secondary | ICD-10-CM | POA: Diagnosis not present

## 2023-07-02 DIAGNOSIS — I951 Orthostatic hypotension: Secondary | ICD-10-CM | POA: Diagnosis not present

## 2023-07-02 DIAGNOSIS — N3000 Acute cystitis without hematuria: Secondary | ICD-10-CM | POA: Diagnosis not present

## 2023-07-02 DIAGNOSIS — Z8249 Family history of ischemic heart disease and other diseases of the circulatory system: Secondary | ICD-10-CM | POA: Diagnosis not present

## 2023-07-02 DIAGNOSIS — K5900 Constipation, unspecified: Secondary | ICD-10-CM | POA: Diagnosis not present

## 2023-07-02 DIAGNOSIS — J309 Allergic rhinitis, unspecified: Secondary | ICD-10-CM | POA: Diagnosis not present

## 2023-07-02 DIAGNOSIS — Z751 Person awaiting admission to adequate facility elsewhere: Secondary | ICD-10-CM | POA: Diagnosis not present

## 2023-07-02 DIAGNOSIS — Z79899 Other long term (current) drug therapy: Secondary | ICD-10-CM

## 2023-07-02 DIAGNOSIS — R350 Frequency of micturition: Secondary | ICD-10-CM | POA: Diagnosis present

## 2023-07-02 DIAGNOSIS — I10 Essential (primary) hypertension: Secondary | ICD-10-CM | POA: Diagnosis present

## 2023-07-02 DIAGNOSIS — I129 Hypertensive chronic kidney disease with stage 1 through stage 4 chronic kidney disease, or unspecified chronic kidney disease: Secondary | ICD-10-CM | POA: Diagnosis not present

## 2023-07-02 DIAGNOSIS — N1831 Chronic kidney disease, stage 3a: Secondary | ICD-10-CM | POA: Diagnosis not present

## 2023-07-02 DIAGNOSIS — E781 Pure hyperglyceridemia: Secondary | ICD-10-CM | POA: Diagnosis not present

## 2023-07-02 DIAGNOSIS — Z7401 Bed confinement status: Secondary | ICD-10-CM | POA: Diagnosis not present

## 2023-07-02 DIAGNOSIS — I2510 Atherosclerotic heart disease of native coronary artery without angina pectoris: Secondary | ICD-10-CM | POA: Diagnosis present

## 2023-07-02 DIAGNOSIS — E872 Acidosis, unspecified: Secondary | ICD-10-CM | POA: Diagnosis not present

## 2023-07-02 DIAGNOSIS — R531 Weakness: Secondary | ICD-10-CM | POA: Diagnosis not present

## 2023-07-02 DIAGNOSIS — G20A1 Parkinson's disease without dyskinesia, without mention of fluctuations: Secondary | ICD-10-CM | POA: Diagnosis present

## 2023-07-02 DIAGNOSIS — Z951 Presence of aortocoronary bypass graft: Secondary | ICD-10-CM | POA: Diagnosis not present

## 2023-07-02 DIAGNOSIS — G2581 Restless legs syndrome: Secondary | ICD-10-CM | POA: Diagnosis not present

## 2023-07-02 DIAGNOSIS — I959 Hypotension, unspecified: Secondary | ICD-10-CM | POA: Diagnosis not present

## 2023-07-02 DIAGNOSIS — N390 Urinary tract infection, site not specified: Principal | ICD-10-CM

## 2023-07-02 DIAGNOSIS — Z7982 Long term (current) use of aspirin: Secondary | ICD-10-CM | POA: Diagnosis not present

## 2023-07-02 DIAGNOSIS — K219 Gastro-esophageal reflux disease without esophagitis: Secondary | ICD-10-CM | POA: Diagnosis not present

## 2023-07-02 DIAGNOSIS — E860 Dehydration: Secondary | ICD-10-CM | POA: Diagnosis present

## 2023-07-02 DIAGNOSIS — G4700 Insomnia, unspecified: Secondary | ICD-10-CM | POA: Diagnosis not present

## 2023-07-02 DIAGNOSIS — E785 Hyperlipidemia, unspecified: Secondary | ICD-10-CM | POA: Diagnosis present

## 2023-07-02 DIAGNOSIS — G20A2 Parkinson's disease without dyskinesia, with fluctuations: Secondary | ICD-10-CM | POA: Diagnosis not present

## 2023-07-02 DIAGNOSIS — I272 Pulmonary hypertension, unspecified: Secondary | ICD-10-CM | POA: Diagnosis present

## 2023-07-02 DIAGNOSIS — R7881 Bacteremia: Secondary | ICD-10-CM | POA: Diagnosis not present

## 2023-07-02 DIAGNOSIS — R778 Other specified abnormalities of plasma proteins: Secondary | ICD-10-CM | POA: Diagnosis not present

## 2023-07-02 DIAGNOSIS — N400 Enlarged prostate without lower urinary tract symptoms: Secondary | ICD-10-CM | POA: Diagnosis not present

## 2023-07-02 DIAGNOSIS — R7989 Other specified abnormal findings of blood chemistry: Secondary | ICD-10-CM

## 2023-07-02 LAB — BASIC METABOLIC PANEL
Anion gap: 18 — ABNORMAL HIGH (ref 5–15)
BUN: 36 mg/dL — ABNORMAL HIGH (ref 8–23)
CO2: 19 mmol/L — ABNORMAL LOW (ref 22–32)
Calcium: 9.1 mg/dL (ref 8.9–10.3)
Chloride: 100 mmol/L (ref 98–111)
Creatinine, Ser: 2.27 mg/dL — ABNORMAL HIGH (ref 0.61–1.24)
GFR, Estimated: 29 mL/min — ABNORMAL LOW (ref >60)
Glucose, Bld: 72 mg/dL (ref 70–99)
Potassium: 4.5 mmol/L (ref 3.5–5.1)
Sodium: 137 mmol/L (ref 135–145)

## 2023-07-02 LAB — CBC
HCT: 36.6 % — ABNORMAL LOW (ref 39.0–52.0)
Hemoglobin: 12 g/dL — ABNORMAL LOW (ref 13.0–17.0)
MCH: 34.5 pg — ABNORMAL HIGH (ref 26.0–34.0)
MCHC: 32.8 g/dL (ref 30.0–36.0)
MCV: 105.2 fL — ABNORMAL HIGH (ref 80.0–100.0)
Platelets: 152 10*3/uL (ref 150–400)
RBC: 3.48 MIL/uL — ABNORMAL LOW (ref 4.22–5.81)
RDW: 12.9 % (ref 11.5–15.5)
WBC: 5.4 10*3/uL (ref 4.0–10.5)
nRBC: 0 % (ref 0.0–0.2)

## 2023-07-02 LAB — HEPATIC FUNCTION PANEL
ALT: 12 U/L (ref 0–44)
AST: 20 U/L (ref 15–41)
Albumin: 4.3 g/dL (ref 3.5–5.0)
Alkaline Phosphatase: 35 U/L — ABNORMAL LOW (ref 38–126)
Bilirubin, Direct: 0.1 mg/dL (ref 0.0–0.2)
Indirect Bilirubin: 1.6 mg/dL — ABNORMAL HIGH (ref 0.3–0.9)
Total Bilirubin: 1.7 mg/dL — ABNORMAL HIGH (ref 0.0–1.2)
Total Protein: 6.8 g/dL (ref 6.5–8.1)

## 2023-07-02 LAB — URINALYSIS, ROUTINE W REFLEX MICROSCOPIC
Bacteria, UA: NONE SEEN
Bilirubin Urine: NEGATIVE
Glucose, UA: NEGATIVE mg/dL
Ketones, ur: 20 mg/dL — AB
Nitrite: NEGATIVE
Protein, ur: 30 mg/dL — AB
Specific Gravity, Urine: 1.017 (ref 1.005–1.030)
Squamous Epithelial / HPF: 0 /[HPF] (ref 0–5)
pH: 5 (ref 5.0–8.0)

## 2023-07-02 LAB — LACTIC ACID, PLASMA
Lactic Acid, Venous: 1 mmol/L (ref 0.5–1.9)
Lactic Acid, Venous: 1 mmol/L (ref 0.5–1.9)

## 2023-07-02 LAB — TROPONIN I (HIGH SENSITIVITY)
Troponin I (High Sensitivity): 28 ng/L — ABNORMAL HIGH (ref <18)
Troponin I (High Sensitivity): 49 ng/L — ABNORMAL HIGH (ref <18)

## 2023-07-02 LAB — CK: Total CK: 70 U/L (ref 49–397)

## 2023-07-02 MED ORDER — PANTOPRAZOLE SODIUM 40 MG PO TBEC
40.0000 mg | DELAYED_RELEASE_TABLET | Freq: Every day | ORAL | Status: DC
  Administered 2023-07-03 – 2023-07-13 (×11): 40 mg via ORAL
  Filled 2023-07-02 (×11): qty 1

## 2023-07-02 MED ORDER — CARBIDOPA-LEVODOPA 25-100 MG PO TABS
1.0000 | ORAL_TABLET | Freq: Every day | ORAL | Status: DC
  Administered 2023-07-03 – 2023-07-13 (×10): 1 via ORAL
  Filled 2023-07-02 (×10): qty 1

## 2023-07-02 MED ORDER — CARBIDOPA-LEVODOPA 25-100 MG PO TABS: 2.0000 | ORAL_TABLET | Freq: Four times a day (QID) | ORAL | Status: DC

## 2023-07-02 MED ORDER — SODIUM CHLORIDE 0.9 % IV SOLN: INTRAVENOUS | Status: AC

## 2023-07-02 MED ORDER — CLONAZEPAM 0.5 MG PO TABS
1.0000 mg | ORAL_TABLET | Freq: Every day | ORAL | Status: DC
  Administered 2023-07-02 – 2023-07-04 (×3): 1 mg via ORAL
  Filled 2023-07-02 (×3): qty 2

## 2023-07-02 MED ORDER — POLYETHYLENE GLYCOL 3350 17 G PO PACK
17.0000 g | PACK | Freq: Every day | ORAL | Status: DC | PRN
  Administered 2023-07-12: 17 g via ORAL
  Filled 2023-07-02: qty 1

## 2023-07-02 MED ORDER — HEPARIN SODIUM (PORCINE) 5000 UNIT/ML IJ SOLN
5000.0000 [IU] | Freq: Two times a day (BID) | INTRAMUSCULAR | Status: DC
  Administered 2023-07-02 – 2023-07-13 (×22): 5000 [IU] via SUBCUTANEOUS
  Filled 2023-07-02 (×21): qty 1

## 2023-07-02 MED ORDER — LACTATED RINGERS IV BOLUS
1000.0000 mL | Freq: Once | INTRAVENOUS | Status: AC
  Administered 2023-07-02: 1000 mL via INTRAVENOUS

## 2023-07-02 MED ORDER — CARBIDOPA-LEVODOPA ER 50-200 MG PO TBCR
1.0000 | EXTENDED_RELEASE_TABLET | Freq: Every day | ORAL | Status: DC
  Administered 2023-07-02 – 2023-07-12 (×11): 1 via ORAL
  Filled 2023-07-02 (×13): qty 1

## 2023-07-02 MED ORDER — SODIUM CHLORIDE 0.9 % IV SOLN: INTRAVENOUS | Status: DC

## 2023-07-02 MED ORDER — CEFTRIAXONE SODIUM 1 G IJ SOLR
1.0000 g | INTRAMUSCULAR | Status: DC
  Administered 2023-07-03 – 2023-07-06 (×4): 1 g via INTRAVENOUS
  Filled 2023-07-02 (×4): qty 10

## 2023-07-02 MED ORDER — ACETAMINOPHEN 500 MG PO TABS
500.0000 mg | ORAL_TABLET | ORAL | Status: DC | PRN
  Administered 2023-07-02: 500 mg via ORAL
  Filled 2023-07-02: qty 1

## 2023-07-02 MED ORDER — CARBIDOPA-LEVODOPA ER 25-100 MG PO TBCR: 1.0000 | EXTENDED_RELEASE_TABLET | Freq: Every day | ORAL | Status: DC

## 2023-07-02 MED ORDER — ENTACAPONE 200 MG PO TABS
200.0000 mg | ORAL_TABLET | Freq: Two times a day (BID) | ORAL | Status: DC
  Administered 2023-07-03 – 2023-07-07 (×10): 200 mg via ORAL
  Filled 2023-07-02 (×10): qty 1

## 2023-07-02 MED ORDER — CARBIDOPA-LEVODOPA 25-100 MG PO TABS
2.0000 | ORAL_TABLET | Freq: Once | ORAL | Status: AC
  Administered 2023-07-02: 2 via ORAL
  Filled 2023-07-02 (×2): qty 2

## 2023-07-02 MED ORDER — MELATONIN 5 MG PO TABS: 2.5000 mg | ORAL_TABLET | Freq: Every evening | ORAL | Status: DC | PRN

## 2023-07-02 MED ORDER — RANOLAZINE ER 500 MG PO TB12
1000.0000 mg | ORAL_TABLET | Freq: Two times a day (BID) | ORAL | Status: DC
  Administered 2023-07-02 – 2023-07-13 (×22): 1000 mg via ORAL
  Filled 2023-07-02 (×23): qty 2

## 2023-07-02 MED ORDER — ENTACAPONE 200 MG PO TABS: 200.0000 mg | ORAL_TABLET | Freq: Two times a day (BID) | ORAL | Status: DC

## 2023-07-02 MED ORDER — CARBIDOPA-LEVODOPA 25-100 MG PO TABS
2.0000 | ORAL_TABLET | Freq: Three times a day (TID) | ORAL | Status: DC
  Administered 2023-07-03 – 2023-07-13 (×32): 2 via ORAL
  Filled 2023-07-02 (×29): qty 2

## 2023-07-02 MED ORDER — ISOSORBIDE MONONITRATE ER 30 MG PO TB24
60.0000 mg | ORAL_TABLET | Freq: Every day | ORAL | Status: DC
  Administered 2023-07-03 – 2023-07-13 (×11): 60 mg via ORAL
  Filled 2023-07-02 (×3): qty 1
  Filled 2023-07-02: qty 2
  Filled 2023-07-02: qty 1
  Filled 2023-07-02: qty 2
  Filled 2023-07-02: qty 1
  Filled 2023-07-02 (×4): qty 2

## 2023-07-02 MED ORDER — ASPIRIN 81 MG PO TBEC
81.0000 mg | DELAYED_RELEASE_TABLET | Freq: Every day | ORAL | Status: DC
  Administered 2023-07-02 – 2023-07-12 (×11): 81 mg via ORAL
  Filled 2023-07-02 (×11): qty 1

## 2023-07-02 MED ORDER — TAMSULOSIN HCL 0.4 MG PO CAPS
0.4000 mg | ORAL_CAPSULE | Freq: Every day | ORAL | Status: DC
  Administered 2023-07-03 – 2023-07-13 (×11): 0.4 mg via ORAL
  Filled 2023-07-02 (×11): qty 1

## 2023-07-02 MED ORDER — ROSUVASTATIN CALCIUM 10 MG PO TABS
10.0000 mg | ORAL_TABLET | Freq: Every day | ORAL | Status: DC
  Administered 2023-07-03 – 2023-07-13 (×11): 10 mg via ORAL
  Filled 2023-07-02 (×3): qty 1
  Filled 2023-07-02: qty 0.5
  Filled 2023-07-02 (×3): qty 1
  Filled 2023-07-02 (×2): qty 0.5
  Filled 2023-07-02 (×3): qty 1
  Filled 2023-07-02: qty 0.5
  Filled 2023-07-02: qty 1
  Filled 2023-07-02: qty 0.5

## 2023-07-02 MED ORDER — SODIUM CHLORIDE 0.9 % IV SOLN
1.0000 g | Freq: Once | INTRAVENOUS | Status: AC
  Administered 2023-07-02: 1 g via INTRAVENOUS
  Filled 2023-07-02: qty 10

## 2023-07-02 NOTE — H&P (Signed)
History and Physical    Johnathan Arnold. QIH:[id number] DOB: July 30, 1945 DOA: 07/02/2023  PCP: [name], MD (Confirm with patient/family/NH records and if not entered, this has to be entered at [facility] point of entry) Patient coming from: Home  I have personally briefly reviewed patient's old medical records in [facility]  Chief Complaint: Legs are weak  HPI: Johnathan Arnold. is a 78 y.o. male with medical history significant of CAD/CABG, HTN, CKD stage IIIa, Parkinson's disease, presented with worsening of generalized weakness.  Patient was diagnosed with Parkinson disease and has not been taking Sinemet since.  It appears that the patient Parkinson's symptoms has been poorly controlled, with mainly tremors on bilateral lower extremities affecting his ambulation and since last year patient has been using roller walker to ambulate and attend intensive outpatient PT sessions at [facility] outpatient PT center.  2 weeks ago patient went to see neurology who started patient on entacapone twice daily in addition to Sinemet.  Starting 1 week ago patient started to feel weaker lower extremities, denied any numbness of lower extremities and no weakness of upper extremities.  Wife also found the patient has had increased nocturnal urination and he does feel " little burning" when he urinates.  Denied any abdominal pain no fever chills no back pains.  Last few days patient has significant decreased oral intake including meals and fluid due to worsening of ambulation.  He stopped taking Lasix and losartan for " "for my kidney"  ED Course: Afebrile, nontachycardic blood pressure borderline low not hypoxic.  Blood work showed creatinine 2.2 compared to baseline about 1.8-1.9, bicarb 19, K4.5.  Chest x-ray negative for acute infiltrates.  UA showed WBC 11-20.  Patient was given IV bolus x 1 and started on ceftriaxone.  Review of Systems: As per HPI otherwise 14 point review of systems negative.     Past Medical History:  Diagnosis Date   3-vessel coronary artery disease    s/p  5 vessel CABG   Diabetes mellitus without complication (HCC)    History of cardiac catheterization 2011   [facility]   Hyperlipidemia    Hypertension    Hypertriglyceridemia    Parkinson's disease (HCC)    Pneumonia 12/28/2020   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 LIMA to LAD, SVG to D1, OM1 and RPDA   CORONARY ARTERY BYPASS GRAFT  03/1998   5 vessel, [facility]   RIGHT HEART CATH N/A 04/04/2019   Procedure: RIGHT HEART CATH;  Surgeon: Iran Ouch, MD;  Location: [facility] INVASIVE CV LAB;  Service: Cardiovascular;  Laterality: N/A;   RIGHT/LEFT HEART CATH AND CORONARY ANGIOGRAPHY N/A 02/01/2018   Procedure: RIGHT/LEFT HEART CATH AND CORONARY ANGIOGRAPHY;  Surgeon: Iran Ouch, MD;  Location: [facility] INVASIVE CV LAB;  Service: Cardiovascular;  Laterality: N/A;     reports that he has never smoked. He has been exposed to tobacco smoke. He has never used smokeless tobacco. He reports current alcohol use. He reports that he does not use drugs.  Allergies  Allergen Reactions   Paxlovid [Nirmatrelvir-Ritonavir]     Family History  Problem Relation Age of Onset   Heart attack Mother 61   Hypertension Mother    Heart attack Father 80   Heart disease Father    Heart disease Brother    Healthy Son  Prior to Admission medications   Medication Sig Start Date End Date Taking? Authorizing Provider  acetaminophen (TYLENOL) 500 MG tablet Take 500 mg by mouth as needed.    [provider]  aspirin EC 81 MG tablet Take 1 tablet (81 mg total) by mouth at bedtime. 09/28/20   Alver Sorrow, NP  carbidopa-levodopa (SINEMET CR) 50-200 MG tablet TAKE 1 TABLET BY MOUTH AT BEDTIME 05/04/23   Tat, Rebecca S, DO  carbidopa-levodopa (SINEMET IR) 25-100 MG tablet TAKE 2 TABLETS BY MOUTH AT  8AM; TAKE 2 TABLETS BY MOUTH AT 11AM; TAKE 2 TABLETS BY MOUTH AT 2PM.; TAKE 1 TABLET BY MOUTH AT 5PM. 06/04/23   Tat, Octaviano Batty, DO  clonazePAM (KLONOPIN) 1 MG tablet Take 1 tablet (1 mg total) by mouth at bedtime. 03/24/23   Tat, Octaviano Batty, DO  entacapone (COMTAN) 200 MG tablet 1 with 8am and 1 with 11am dose of levodopa 06/18/23   Tat, Octaviano Batty, DO  furosemide (LASIX) 20 MG tablet Take 1 tablet (20 mg total) by mouth daily as needed (for swelling). 03/02/23   Reather Littler D, NP  isosorbide mononitrate (IMDUR) 60 MG 24 hr tablet TAKE 1 TABLET BY MOUTH DAILY 04/06/23   Iran Ouch, MD  losartan (COZAAR) 25 MG tablet Take 1 tablet (25 mg total) by mouth daily as needed (at night for systolic greater than 140). 03/02/23   Reather Littler D, NP  melatonin 3 MG TABS tablet Take 1.5 mg by mouth at bedtime as needed.    [provider]  Multiple Vitamin (MULTIVITAMIN) tablet Take 1 tablet by mouth daily.    [provider]  nitroGLYCERIN (NITROSTAT) 0.4 MG SL tablet Place 1 tablet (0.4 mg total) under the tongue every 5 (five) minutes as needed for chest pain. 09/28/20   Alver Sorrow, NP  omeprazole (PRILOSEC) 20 MG capsule Take 1 capsule (20 mg total) by mouth every morning. 05/22/23   [name], MD  polyethylene glycol (MIRALAX / GLYCOLAX) packet Take 17 g by mouth daily as needed.    [provider]  ranolazine (RANEXA) 1000 MG SR tablet TAKE 1 TABLET BY MOUTH 2 TIMES A DAY 03/05/23   Iran Ouch, MD  rosuvastatin (CRESTOR) 10 MG tablet TAKE 1 TABLET BY MOUTH DAILY 06/04/23   Iran Ouch, MD  tamsulosin (FLOMAX) 0.4 MG CAPS capsule TAKE ONE CAPSULE BY MOUTH DAILY 08/14/22   Harle Battiest, PA-C    Physical Exam: Vitals:   07/02/23 1041 07/02/23 1043  BP:  117/80  Pulse:  61  Resp:  18  Temp:  97.7 F (36.5 C)  TempSrc:  Oral  SpO2:  100%  Weight: 74 kg   Height: 5\' 6"  (1.676 m)     Constitutional: NAD, calm, comfortable Vitals:   07/02/23  1041 07/02/23 1043  BP:  117/80  Pulse:  61  Resp:  18  Temp:  97.7 F (36.5 C)  TempSrc:  Oral  SpO2:  100%  Weight: 74 kg   Height: 5\' 6"  (1.676 m)    Eyes: PERRL, lids and conjunctivae normal ENMT: Mucous membranes are moist. Posterior pharynx clear of any exudate or lesions.Normal dentition.  Neck: normal, supple, no masses, no thyromegaly Respiratory: clear to auscultation bilaterally, no wheezing, no crackles. Normal respiratory effort. No accessory muscle use.  Cardiovascular: Regular rate and rhythm, no murmurs / rubs / gallops. No extremity edema. 2+ pedal pulses. No carotid bruits.  Abdomen: no tenderness, no masses  palpated. No hepatosplenomegaly. Bowel sounds positive.  Musculoskeletal: no clubbing / cyanosis. No joint deformity upper and lower extremities. Good ROM, no contractures. Normal muscle tone.  Skin: no rashes, lesions, ulcers. No induration Neurologic: CN 2-12 grossly intact. Sensation intact, DTR normal. Strength 5/5 in all 4.  Tremors 3 to 4 Hz found predominantly on bilateral lower extremities with normal rigidity and muscle tone Psychiatric: Normal judgment and insight. Alert and oriented x 3. Normal mood.     Labs on Admission: I have personally reviewed following labs and imaging studies  CBC: Recent Labs  Lab 07/02/23 1045  WBC 5.4  HGB 12.0*  HCT 36.6*  MCV 105.2*  PLT 152   Basic Metabolic Panel: Recent Labs  Lab 07/02/23 1045  NA 137  K 4.5  CL 100  CO2 19*  GLUCOSE 72  BUN 36*  CREATININE 2.27*  CALCIUM 9.1   GFR: Estimated Creatinine Clearance: 24.2 mL/min (A) (by C-G formula based on SCr of 2.27 mg/dL (H)). Liver Function Tests: Recent Labs  Lab 07/02/23 1045  AST 20  ALT 12  ALKPHOS 35*  BILITOT 1.7*  PROT 6.8  ALBUMIN 4.3   No results for input(s): "LIPASE", "AMYLASE" in the last 168 hours. No results for input(s): "AMMONIA" in the last 168 hours. Coagulation Profile: No results for input(s): "INR", "PROTIME" in  the last 168 hours. Cardiac Enzymes: Recent Labs  Lab 07/02/23 1045  CKTOTAL 70   BNP (last 3 results) No results for input(s): "PROBNP" in the last 8760 hours. HbA1C: No results for input(s): "HGBA1C" in the last 72 hours. CBG: No results for input(s): "GLUCAP" in the last 168 hours. Lipid Profile: No results for input(s): "CHOL", "HDL", "LDLCALC", "TRIG", "CHOLHDL", "LDLDIRECT" in the last 72 hours. Thyroid Function Tests: No results for input(s): "TSH", "T4TOTAL", "FREET4", "T3FREE", "THYROIDAB" in the last 72 hours. Anemia Panel: No results for input(s): "VITAMINB12", "FOLATE", "FERRITIN", "TIBC", "IRON", "RETICCTPCT" in the last 72 hours. Urine analysis:    Component Value Date/Time   COLORURINE AMBER (A) 07/02/2023 1045   APPEARANCEUR CLEAR (A) 07/02/2023 1045   APPEARANCEUR Clear 04/15/2018 0906   LABSPEC 1.017 07/02/2023 1045   LABSPEC 1.009 03/15/2014 1005   PHURINE 5.0 07/02/2023 1045   GLUCOSEU NEGATIVE 07/02/2023 1045   GLUCOSEU Negative 03/15/2014 1005   HGBUR SMALL (A) 07/02/2023 1045   BILIRUBINUR NEGATIVE 07/02/2023 1045   BILIRUBINUR Negative 04/15/2018 0906   BILIRUBINUR Negative 03/15/2014 1005   KETONESUR 20 (A) 07/02/2023 1045   PROTEINUR 30 (A) 07/02/2023 1045   UROBILINOGEN 1.0 03/16/2012 1630   NITRITE NEGATIVE 07/02/2023 1045   LEUKOCYTESUR TRACE (A) 07/02/2023 1045   LEUKOCYTESUR Negative 03/15/2014 1005    Radiological Exams on Admission: DG Chest 2 View Result Date: 07/02/2023 CLINICAL DATA:  Weakness. EXAM: CHEST - 2 VIEW COMPARISON:  Chest radiograph dated 07/06/2021. FINDINGS: No focal consolidation, pleural effusion, or pneumothorax. Stable cardiac silhouette. Median sternotomy wires and CABG vascular clips. No acute osseous pathology. IMPRESSION: No active cardiopulmonary disease. Electronically Signed   By: Elgie Collard M.D.   On: 07/02/2023 12:31    EKG: Independently reviewed.  Sinus, similar QRS changes V1 through V3 as  before  Assessment/Plan Principal Problem:   Impaired ambulation Active Problems:   UTI (urinary tract infection)  (please populate well all problems here in Problem List. (For example, if patient is on BP meds at home and you resume or decide to hold them, it is a problem that needs to be her. Same for CAD,  COPD, HLD and so on)  Acute on chronic ambulation impairment -Probably multiple factorial, the main driving force appears to be worsening of Parkinson's disease control, on top of that patient has a concurrent UTI. -Patient's neurology already adjusted his Parkinson's medications, by initiating entacapone 2 weeks ago. -Continue current Sinemet regimen -PT OT evaluation  UTI -Continue ceftriaxone -Check PVR, may need to consider restart Flomax  AKI on CKD stage IIIa -Clinically appears to be volume contracted, plan to continue to hold Lasix and valsartan -Gentle hydration x 10 hours then reevaluate kidney function and volume status.  CAD with history of CABG -No chest pains -EKG showed chronic QRS changes on V1-V3.  Troponin= 20 on first set -Continue aspirin and statin  HTN -Hold off Lasix and losartan  DVT prophylaxis: Heparin subcu Code Status: Full code Family Communication: Wife at bedside Disposition Plan: Patient is sick with significant worsening of ambulation impairment, requiring inpatient PT evaluation, with concurrent UTI as well as AKI, expect more than 2 midnight hospital stay Consults called: None Admission status: Medsurg admit   Emeline General MD [facility] Pager [phone]  07/02/2023, 3:29 PM

## 2023-07-02 NOTE — ED Provider Notes (Signed)
[facility] Provider Note    Event Date/Time   First MD Initiated Contact with Patient 07/02/23 1136     (approximate)   History   Weakness   HPI  Johnathan Arnold. is a 78 y.o. male with history of Parkinson's, restless leg syndrome, sleep apnea, pulmonary hypertension, presenting with generalized weakness for the last 5 days.  Independent history obtained from wife, he has been feeling weak for last 5 days, was complaining of dysuria, has been having decreased p.o. intake.  Typically walks with a walker but has not been able to do so due to his generalized weakness.  They live at home alone and she is his main caregiver.  Per EMS he was orthostatic for them, was given 500 cc of IV fluids.  Independent history obtained from chart, patient does have prior history of syncope, was suspected by neurology to have intermittent neurogenic orthostatic hypotension.     Physical Exam   Triage Vital Signs: ED Triage Vitals  Encounter Vitals Group     BP 07/02/23 1043 117/80     Systolic BP Percentile --      Diastolic BP Percentile --      Pulse Rate 07/02/23 1043 61     Resp 07/02/23 1043 18     Temp 07/02/23 1043 97.7 F (36.5 C)     Temp Source 07/02/23 1043 Oral     SpO2 07/02/23 1043 100 %     Weight 07/02/23 1041 163 lb 2.3 oz (74 kg)     Height 07/02/23 1041 5\' 6"  (1.676 m)     Head Circumference --      Peak Flow --      Pain Score 07/02/23 1041 0     Pain Loc --      Pain Education --      Exclude from Growth Chart --     Most recent vital signs: Vitals:   07/02/23 1043  BP: 117/80  Pulse: 61  Resp: 18  Temp: 97.7 F (36.5 C)  SpO2: 100%     General: Awake, no distress.  CV:  Good peripheral perfusion.  Resp:  Normal effort.  Clear to auscultation bilaterally Abd:  No distention.  Soft nontender Other:  No cranial nerve deficits, pupils equal and reactive, extraocular movements are intact, no focal weakness or numbness.  He is  dry on exam.   ED Results / Procedures / Treatments   Labs (all labs ordered are listed, but only abnormal results are displayed) Labs Reviewed  BASIC METABOLIC PANEL - Abnormal; Notable for the following components:      Result Value   CO2 19 (*)    BUN 36 (*)    Creatinine, Ser 2.27 (*)    GFR, Estimated 29 (*)    Anion gap 18 (*)    All other components within normal limits  CBC - Abnormal; Notable for the following components:   RBC 3.48 (*)    Hemoglobin 12.0 (*)    HCT 36.6 (*)    MCV 105.2 (*)    MCH 34.5 (*)    All other components within normal limits  URINALYSIS, ROUTINE W REFLEX MICROSCOPIC - Abnormal; Notable for the following components:   Color, Urine AMBER (*)    APPearance CLEAR (*)    Hgb urine dipstick SMALL (*)    Ketones, ur 20 (*)    Protein, ur 30 (*)    Leukocytes,Ua TRACE (*)    All  other components within normal limits  HEPATIC FUNCTION PANEL - Abnormal; Notable for the following components:   Alkaline Phosphatase 35 (*)    Total Bilirubin 1.7 (*)    Indirect Bilirubin 1.6 (*)    All other components within normal limits  TROPONIN I (HIGH SENSITIVITY) - Abnormal; Notable for the following components:   Troponin I (High Sensitivity) 28 (*)    All other components within normal limits  CULTURE, BLOOD (ROUTINE X 2)  CULTURE, BLOOD (ROUTINE X 2)  LACTIC ACID, PLASMA  LACTIC ACID, PLASMA  CK  CBG MONITORING, ED  TROPONIN I (HIGH SENSITIVITY)     EKG  Sinus rhythm, rate of 69, normal QRS, normal QTc, T wave flattening in lateral leads, no ischemic ST elevation, not significant change compared to prior   RADIOLOGY Chest x-ray on my interpretation without any obvious consolidation   PROCEDURES:  Critical Care performed: No  Procedures   MEDICATIONS ORDERED IN ED: Medications  cefTRIAXone (ROCEPHIN) 1 g in sodium chloride 0.9 % 100 mL IVPB (1 g Intravenous New Bag/Given 07/02/23 1435)  carbidopa-levodopa (SINEMET IR) 25-100 MG per  tablet immediate release 2 tablet (has no administration in time range)  lactated ringers bolus 1,000 mL (0 mLs Intravenous Stopped 07/02/23 1352)     IMPRESSION / MDM / ASSESSMENT AND PLAN / ED COURSE  I reviewed the triage vital signs and the nursing notes.                              Differential diagnosis includes, but is not limited to, UTI, pneumonia, viral illness, electrolyte derangements, atypical ACS, dehydration, neurogenic orthostasis.  Will get labs, EKG, troponin, chest x-ray, UA, IV fluids.  Patient's presentation is most consistent with acute presentation with potential threat to life or bodily function.  Patient with history of Parkinson's, presenting with dysuria, generalized weakness, was found to be orthostatic for EMS.  Also dry on exam.  Given fluids, found to have an AKI, mild shortly, UA positive.  No urine micro sensitivities to compare.  Will give him a dose of ceftriaxone here.  Given that he is high risk and that his wife with expressive aphasia is home alone with him and his primary caregiver, and he has no too weak to walk with walker, he is at high risk and will need to come in for further management and treatment.  Consult the hospitalist was agreeable plan for admission will evaluate the patient.  He is admitted.  Clinical Course as of 07/02/23 1456  Thu Jul 02, 2023  1243 Basic metabolic panel(!) Electrolytes not severely deranged, creatinine is mildly elevated compared to prior [TT]  1244 CBC(!) No leukocytosis, H&H is stable [TT]  1307 DG Chest 2 View No active cardiopulmonary disease.  [TT]  1311 Troponin I (High Sensitivity)(!): 28 Mildly elevated, could be due to demand.  Patient has no chest pain or shortness of breath.  EKG is nonischemic. [TT]  1311 Lactic Acid, Venous: 1.0 Normal [TT]  1450 Urinalysis, Routine w reflex microscopic -Urine, Clean Catch(!) UA with trace leuk esterase, small amount WBCs, no bacteria the patient does have reports of  dysuria.  Will treat for UTI, IV ceftriaxone. [TT]  1450 Also give him his home dose of Sinemet. [TT]    Clinical Course User Index [TT] Jodie Echevaria Franchot Erichsen, MD     FINAL CLINICAL IMPRESSION(S) / ED DIAGNOSES   Final diagnoses:  Weakness  Dehydration  Urinary  tract infection without hematuria, site unspecified  AKI (acute kidney injury) (HCC)  Elevated troponin     Rx / DC Orders   ED Discharge Orders     None        Note:  This document was prepared using Dragon voice recognition software and may include unintentional dictation errors.    Claybon Jabs, MD 07/02/23 [phone]

## 2023-07-02 NOTE — ED Triage Notes (Signed)
First Nurse Note: Patient to ED via [facility] from home for generalized weakness since Friday. Has not ate since then. Positive orthostatics with EMS.  Cbg 78 62 HR 169/59  Given 500 mL NaCL- 18 L AC

## 2023-07-03 DIAGNOSIS — G20A2 Parkinson's disease without dyskinesia, with fluctuations: Secondary | ICD-10-CM

## 2023-07-03 DIAGNOSIS — N3000 Acute cystitis without hematuria: Secondary | ICD-10-CM | POA: Diagnosis not present

## 2023-07-03 DIAGNOSIS — R262 Difficulty in walking, not elsewhere classified: Secondary | ICD-10-CM | POA: Diagnosis not present

## 2023-07-03 DIAGNOSIS — R7881 Bacteremia: Secondary | ICD-10-CM | POA: Diagnosis not present

## 2023-07-03 DIAGNOSIS — N179 Acute kidney failure, unspecified: Secondary | ICD-10-CM

## 2023-07-03 DIAGNOSIS — R531 Weakness: Secondary | ICD-10-CM

## 2023-07-03 DIAGNOSIS — N1831 Chronic kidney disease, stage 3a: Secondary | ICD-10-CM

## 2023-07-03 LAB — BLOOD CULTURE ID PANEL (REFLEXED) - BCID2

## 2023-07-03 LAB — CBC
HCT: 30.9 % — ABNORMAL LOW (ref 39.0–52.0)
Hemoglobin: 10.8 g/dL — ABNORMAL LOW (ref 13.0–17.0)
MCH: 35 pg — ABNORMAL HIGH (ref 26.0–34.0)
MCHC: 35 g/dL (ref 30.0–36.0)
MCV: 100 fL (ref 80.0–100.0)
Platelets: 129 10*3/uL — ABNORMAL LOW (ref 150–400)
RBC: 3.09 MIL/uL — ABNORMAL LOW (ref 4.22–5.81)
RDW: 13 % (ref 11.5–15.5)
WBC: 5 10*3/uL (ref 4.0–10.5)
nRBC: 0 % (ref 0.0–0.2)

## 2023-07-03 LAB — GLUCOSE, CAPILLARY: Glucose-Capillary: 139 mg/dL — ABNORMAL HIGH (ref 70–99)

## 2023-07-03 LAB — BASIC METABOLIC PANEL
Anion gap: 15 (ref 5–15)
BUN: 26 mg/dL — ABNORMAL HIGH (ref 8–23)
CO2: 18 mmol/L — ABNORMAL LOW (ref 22–32)
Calcium: 8.9 mg/dL (ref 8.9–10.3)
Chloride: 106 mmol/L (ref 98–111)
Creatinine, Ser: 1.74 mg/dL — ABNORMAL HIGH (ref 0.61–1.24)
GFR, Estimated: 40 mL/min — ABNORMAL LOW (ref >60)
Glucose, Bld: 62 mg/dL — ABNORMAL LOW (ref 70–99)
Potassium: 4.2 mmol/L (ref 3.5–5.1)
Sodium: 139 mmol/L (ref 135–145)

## 2023-07-03 NOTE — Evaluation (Signed)
Physical Therapy Evaluation Patient Details Name: Johnathan Arnold. MRN: [id number] DOB: 07/15/1945 Today's Date: 07/03/2023  History of Present Illness  Pt is a 78 y.o. male with medical history significant of CAD/CABG, HTN, CKD stage IIIa, Parkinson's disease, presented with worsening of generalized weakness.  MD assessment includes: acute on chronic ambulation impairment, AKI on CKD IIIa, and UTI.   Clinical Impression  Pt was pleasant and motivated to participate during the session and put forth good effort throughout. Pt required no physical assistance during the session and presented with good control and stability with transfers and with no overt LOB during gait with a RW.  Pt was able to amb 100 feet with relative ease speaking throughout and was able to navigate tight spaces and make sharp turns with no instability noted.  Pt's SpO2 and HR both WNL on room air during the session.  Pt does have an extensive fall history while ambulating with his QC with education provided to use his RW going forward until cleared at OPPT to return to QC use.  Pt will benefit from continued PT services upon discharge to safely address deficits listed in patient problem list for decreased caregiver assistance and eventual return to PLOF.          If plan is discharge home, recommend the following: A little help with walking and/or transfers;A little help with bathing/dressing/bathroom;Assistance with cooking/housework;Assist for transportation;Help with stairs or ramp for entrance   Can travel by private vehicle        Equipment Recommendations None recommended by PT  Recommendations for Other Services       Functional Status Assessment Patient has had a recent decline in their functional status and demonstrates the ability to make significant improvements in function in a reasonable and predictable amount of time.     Precautions / Restrictions Precautions Precautions: Fall Restrictions Weight  Bearing Restrictions Per Provider Order: No      Mobility  Bed Mobility Overal bed mobility: Modified Independent             General bed mobility comments: Extra time and effort only    Transfers Overall transfer level: Needs assistance Equipment used: Rolling walker (2 wheels) Transfers: Sit to/from Stand Sit to Stand: Supervision           General transfer comment: Good eccentric and concentric control and stability    Ambulation/Gait Ambulation/Gait assistance: Supervision Gait Distance (Feet): 100 Feet Assistive device: Rolling walker (2 wheels) Gait Pattern/deviations: Step-through pattern, Decreased step length - right, Decreased step length - left Gait velocity: decreased     General Gait Details: Slow cadence but steady with the RW with no overt LOB or adverse symptoms  Stairs            Wheelchair Mobility     Tilt Bed    Modified Rankin (Stroke Patients Only)       Balance Overall balance assessment: Needs assistance   Sitting balance-Leahy Scale: Normal     Standing balance support: Bilateral upper extremity supported, During functional activity Standing balance-Leahy Scale: Good                               Pertinent Vitals/Pain Pain Assessment Pain Assessment: No/denies pain    Home Living Family/patient expects to be discharged to:: Private residence Living Arrangements: Spouse/significant other Available Help at Discharge: Family;Available 24 hours/day Type of Home: House Home Access: Stairs to enter Entrance  Stairs-Rails: None Entrance Stairs-Number of Steps: 1   Home Layout: Two level;Able to live on main level with bedroom/bathroom Home Equipment: Rolling Walker (2 wheels);Cane - quad      Prior Function Prior Level of Function : Independent/Modified Independent;History of Falls (last six months)             Mobility Comments: Mod Ind amb with a QC limited community distances, 10 falls in the  last 6 months secondary to LOB, currently working with OPPT at [facility] ADLs Comments: Ind with ADLs     Extremity/Trunk Assessment   Upper Extremity Assessment Upper Extremity Assessment: Defer to OT evaluation    Lower Extremity Assessment Lower Extremity Assessment: Generalized weakness       Communication   Communication Communication: No apparent difficulties Cueing Techniques: Verbal cues  Cognition Arousal: Alert Behavior During Therapy: WFL for tasks assessed/performed Overall Cognitive Status: Within Functional Limits for tasks assessed                                          General Comments      Exercises Other Exercises Other Exercises: Pt education provided on use of RW upon discharge instead of his QC until cleard by OPPT to return to QC   Assessment/Plan    PT Assessment Patient needs continued PT services  PT Problem List Decreased strength;Decreased activity tolerance;Decreased balance;Decreased mobility;Decreased knowledge of use of DME       PT Treatment Interventions DME instruction;Gait training;Stair training;Functional mobility training;Therapeutic activities;Therapeutic exercise;Balance training;Patient/family education    PT Goals (Current goals can be found in the Care Plan section)  Acute Rehab PT Goals Patient Stated Goal: To get stronger PT Goal Formulation: With patient Time For Goal Achievement: 07/16/23 Potential to Achieve Goals: Good    Frequency Min 1X/week     Co-evaluation               AM-PAC PT "6 Clicks" Mobility  Outcome Measure Help needed turning from your back to your side while in a flat bed without using bedrails?: None Help needed moving from lying on your back to sitting on the side of a flat bed without using bedrails?: None Help needed moving to and from a bed to a chair (including a wheelchair)?: A Little Help needed standing up from a chair using your arms (e.g., wheelchair or bedside  chair)?: A Little Help needed to walk in hospital room?: A Little Help needed climbing 3-5 steps with a railing? : A Little 6 Click Score: 20    End of Session Equipment Utilized During Treatment: Gait belt Activity Tolerance: Patient tolerated treatment well Patient left: in chair;with call bell/phone within reach;with chair alarm set;with family/visitor present Nurse Communication: Mobility status PT Visit Diagnosis: Unsteadiness on feet (R26.81);History of falling (Z91.81);Difficulty in walking, not elsewhere classified (R26.2);Muscle weakness (generalized) (M62.81)    Time: 9563-8756 PT Time Calculation (min) (ACUTE ONLY): 29 min   Charges:   PT Evaluation $PT Eval Moderate Complexity: 1 Mod PT Treatments $Gait Training: 8-22 mins PT General Charges $$ ACUTE PT VISIT: 1 Visit       D. Scott Kaemon Barnett PT, DPT 07/03/23, 11:13 AM

## 2023-07-03 NOTE — Plan of Care (Signed)
  Problem: Education: Goal: Knowledge of General Education information will improve Description: Including pain rating scale, medication(s)/side effects and non-pharmacologic comfort measures Outcome: Progressing   Problem: Health Behavior/Discharge Planning: Goal: Ability to manage health-related needs will improve Outcome: Progressing   Problem: Clinical Measurements: Goal: Ability to maintain clinical measurements within normal limits will improve Outcome: Progressing Goal: Will remain free from infection Outcome: Progressing Goal: Diagnostic test results will improve Outcome: Progressing Goal: Respiratory complications will improve Outcome: Progressing Goal: Cardiovascular complication will be avoided Outcome: Progressing   Problem: Nutrition: Goal: Adequate nutrition will be maintained Outcome: Progressing   Problem: Activity: Goal: Risk for activity intolerance will decrease Outcome: Progressing   Problem: Coping: Goal: Level of anxiety will decrease Outcome: Progressing   Problem: Elimination: Goal: Will not experience complications related to bowel motility Outcome: Progressing Goal: Will not experience complications related to urinary retention Outcome: Progressing   Problem: Safety: Goal: Ability to remain free from injury will improve Outcome: Progressing   Problem: Pain Managment: Goal: General experience of comfort will improve and/or be controlled Outcome: Progressing   Problem: Skin Integrity: Goal: Risk for impaired skin integrity will decrease Outcome: Progressing

## 2023-07-03 NOTE — Evaluation (Signed)
Occupational Therapy Evaluation Patient Details Name: Johnathan Arnold. MRN: [id number] DOB: [birth year] Today's Date: 07/03/2023   History of Present Illness 78 y.o. male  presented with worsening of generalized weakness. MD assessment includes: acute on chronic ambulation impairment, AKI on CKD IIIa, and UTI. PMHx: CAD/CABG, HTN, CKD stage IIIa, Parkinson's disease-BLE tremors.   Clinical Impression   Pt was seen for OT evaluation this date. Prior to hospital admission, pt was living at home with his wife and ambulating with a QC with multiple recent falls. He was attending OPPT at [facility]. IND with ADLs.   Pt presents to acute OT demonstrating impaired ADL performance and functional mobility 2/2 weakness, pain and mild balance deficits (See OT problem list for additional functional deficits). Pt currently requires SUP for LB dressing to don boxer briefs via seated lateral leans and sit to stand. SUP for STS and SBA for in room mobility to the bathroom and back using RW with no LOB and good safety. SUP for toilet transfer, although he pulls on walker to stand despite cues to use grab bar or push from toilet. Edu wife and pt on AE/AD and other safety technique ans home modifications to safely and IND perform ADLs. Pt would benefit from skilled OT services to address noted impairments and functional limitations (see below for any additional details) in order to maximize safety and independence while minimizing falls risk and caregiver burden. Do not anticipate the need for follow up OT services upon acute hospital DC.        If plan is discharge home, recommend the following: A little help with walking and/or transfers;A little help with bathing/dressing/bathroom;Assist for transportation;Assistance with cooking/housework    Functional Status Assessment  Patient has had a recent decline in their functional status and demonstrates the ability to make significant improvements in function in a  reasonable and predictable amount of time.  Equipment Recommendations  Tub/shower seat    Recommendations for Other Services       Precautions / Restrictions Precautions Precautions: Fall Restrictions Weight Bearing Restrictions Per Provider Order: No      Mobility Bed Mobility               General bed mobility comments: NT up in bed pre/post session    Transfers Overall transfer level: Needs assistance Equipment used: Rolling walker (2 wheels) Transfers: Sit to/from Stand Sit to Stand: Supervision           General transfer comment: SUP for STS and in room mobility using RW to the bathroom and back      Balance Overall balance assessment: Needs assistance   Sitting balance-Leahy Scale: Normal     Standing balance support: Bilateral upper extremity supported, During functional activity Standing balance-Leahy Scale: Good Standing balance comment: RW use with no LOB and good safety                           ADL either performed or assessed with clinical judgement   ADL Overall ADL's : Needs assistance/impaired                     Lower Body Dressing: Supervision/safety;Sit to/from stand Lower Body Dressing Details (indicate cue type and reason): to don boxer briefs Toilet Transfer: Hospital doctor Details (indicate cue type and reason): BSC over toilet, but pt pulls on RW despite cues not to         Functional mobility  during ADLs: Supervision/safety;Rolling walker (2 wheels)       Vision         Perception         Praxis         Pertinent Vitals/Pain Pain Assessment Pain Assessment: No/denies pain     Extremity/Trunk Assessment Upper Extremity Assessment Upper Extremity Assessment: Overall WFL for tasks assessed   Lower Extremity Assessment Lower Extremity Assessment: Generalized weakness       Communication Communication Communication: No apparent  difficulties Cueing Techniques: Verbal cues   Cognition Arousal: Alert Behavior During Therapy: WFL for tasks assessed/performed Overall Cognitive Status: Within Functional Limits for tasks assessed                                       General Comments       Exercises     Shoulder Instructions      Home Living Family/patient expects to be discharged to:: Private residence Living Arrangements: Spouse/significant other Available Help at Discharge: Family;Available 24 hours/day Type of Home: House Home Access: Stairs to enter [employer] of Steps: 1 Entrance Stairs-Rails: None Home Layout: Two level;Able to live on main level with bedroom/bathroom     Bathroom Shower/Tub: Arts development officer Toilet: Handicapped height     Home Equipment: Agricultural consultant (2 wheels);Cane - quad          Prior Functioning/Environment Prior Level of Function : Independent/Modified Independent;History of Falls (last six months)             Mobility Comments: Mod Ind amb with a QC limited community distances, 10 falls in the last 6 months secondary to LOB, currently working with OPPT at [facility] ADLs Comments: Ind with ADLs        OT Problem List: Decreased strength;Decreased activity tolerance      OT Treatment/Interventions: Self-care/ADL training;Therapeutic exercise;Therapeutic activities;Patient/family education;Balance training;DME and/or AE instruction    OT Goals(Current goals can be found in the care plan section) Acute Rehab OT Goals Patient Stated Goal: improve LE strength OT Goal Formulation: With patient Time For Goal Achievement: 07/17/23 Potential to Achieve Goals: Good ADL Goals Pt Will Perform Lower Body Bathing: with supervision;sitting/lateral leans;sit to/from stand Pt Will Perform Lower Body Dressing: with set-up;sitting/lateral leans;sit to/from stand Pt Will Transfer to Toilet: with supervision;ambulating;regular height  toilet Pt Will Perform Toileting - Clothing Manipulation and hygiene: sit to/from stand;sitting/lateral leans;with set-up  OT Frequency: Min 1X/week    Co-evaluation              AM-PAC OT "6 Clicks" Daily Activity     Outcome Measure Help from another person eating meals?: None Help from another person taking care of personal grooming?: None Help from another person toileting, which includes using toliet, bedpan, or urinal?: A Little Help from another person bathing (including washing, rinsing, drying)?: A Little Help from another person to put on and taking off regular upper body clothing?: None Help from another person to put on and taking off regular lower body clothing?: None 6 Click Score: 22   End of Session Equipment Utilized During Treatment: Rolling walker (2 wheels) Nurse Communication: Mobility status  Activity Tolerance: Patient tolerated treatment well Patient left: in chair;with call bell/phone within reach;with chair alarm set;with family/visitor present  OT Visit Diagnosis: Other abnormalities of gait and mobility (R26.89);Repeated falls (R29.6)  Time: 8295-6213 OT Time Calculation (min): 30 min Charges:  OT General Charges $OT Visit: 1 Visit OT Evaluation $OT Eval Low Complexity: 1 Low OT Treatments $Self Care/Home Management : 8-22 mins Adeline Petitfrere, OTR/L  07/03/23, 12:08 PM  Tyneshia Stivers E Tristin Vandeusen 07/03/2023, 12:05 PM

## 2023-07-03 NOTE — Progress Notes (Signed)
Progress Note   Patient: Johnathan Arnold. ZOX:[id number] DOB: 12/05/1945 DOA: 07/02/2023     1 DOS: the patient was seen and examined on 07/03/2023   Brief hospital course: Johnathan Arnold. is a 78 y.o. male with medical history significant of CAD/CABG, HTN, CKD stage IIIa, Parkinson's disease, presented with worsening of generalized weakness.  Patient is admitted to the hospitalist service for worsening generalized weakness in the setting of UTI.  Assessment and Plan: Generalized weakness- Ambulatory impairment- Multifactorial in the setting of Parkinson's disease, UTI. Continue entacapone, Sinemet therapy. PT OT evaluation.  Positive blood cultures with staph species. Likely contamination. Follow-up final culture sensitivities. Discussed with pharmacist.  UTI- continue Rocephin therapy. Follow urine cultures.  Acute on CKD stage IIIa. Patient's kidney function improved with gentle hydration. Avoid nephrotoxic drugs. Monitor daily renal function. Metabolic acidosis noted.  Will give sodium bicarb.  CAD with history of CABG- No active chest pain. Continue aspirin, statin.  Hypertension: Blood pressure stable. Hold losartan due to AKI. Will start Lasix from tomorrow.     Out of bed to chair. Incentive spirometry. Nursing supportive care. Fall, aspiration precautions. DVT prophylaxis   Code Status: Full Code  Subjective: Patient is seen and examined today morning.  Patient is sitting in chair, wife at bedside.  He is working with occupational therapist.  Feels better today.  Eating fair.  Physical Exam: Vitals:   07/03/23 0407 07/03/23 0753 07/03/23 1141 07/03/23 1529  BP: (!) 145/52 (!) 157/61 (!) 140/54 (!) 145/57  Pulse: 67 61 60 66  Resp: 20 18 18 18   Temp: 98.3 F (36.8 C) 98.6 F (37 C) (!) 96.9 F (36.1 C) 98.2 F (36.8 C)  TempSrc: Oral  Axillary   SpO2: 98% 98% 100% 100%  Weight:      Height:        General - Elderly Caucasian male, no  apparent distress HEENT - PERRLA, EOMI, atraumatic head, non tender sinuses. Lung - Clear, diffuse rales, rhonchi, wheezes. Heart - S1, S2 heard, no murmurs, rubs, trace pedal edema. Abdomen - Soft, non tender, bowel sounds good Neuro - Alert, awake and oriented, lower extremity weak Skin - Warm and dry.  Data Reviewed:      Latest Ref Rng & Units 07/03/2023    6:18 AM 07/02/2023   10:45 AM 05/22/2023    4:31 PM  CBC  WBC 4.0 - 10.5 K/uL 5.0  5.4  5.2   Hemoglobin 13.0 - 17.0 g/dL 81.1  91.4  78.2   Hematocrit 39.0 - 52.0 % 30.9  36.6  32.8   Platelets 150 - 400 K/uL 129  152  189       Latest Ref Rng & Units 07/03/2023    6:18 AM 07/02/2023   10:45 AM 05/22/2023    4:31 PM  BMP  Glucose 70 - 99 mg/dL 62  72  956   BUN 8 - 23 mg/dL 26  36  20   Creatinine 0.61 - 1.24 mg/dL 2.13  0.86  5.78   Sodium 135 - 145 mmol/L 139  137  136   Potassium 3.5 - 5.1 mmol/L 4.2  4.5  4.8   Chloride 98 - 111 mmol/L 106  100  103   CO2 22 - 32 mmol/L 18  19  25    Calcium 8.9 - 10.3 mg/dL 8.9  9.1  8.8    DG Chest 2 View Result Date: 07/02/2023 CLINICAL DATA:  Weakness. EXAM: CHEST - 2 VIEW  COMPARISON:  Chest radiograph dated 07/06/2021. FINDINGS: No focal consolidation, pleural effusion, or pneumothorax. Stable cardiac silhouette. Median sternotomy wires and CABG vascular clips. No acute osseous pathology. IMPRESSION: No active cardiopulmonary disease. Electronically Signed   By: Elgie Collard M.D.   On: 07/02/2023 12:31   Family Communication: Discussed with patient, wife at bedside, they understand and agree. All questions answereed.  Disposition: Status is: Inpatient Remains inpatient appropriate because: weakness, UTI  Planned Discharge Destination: Home with Home Health     Time spent: 41 minutes  Author: Marcelino Duster, MD 07/03/2023 4:16 PM Secure chat 7am to 7pm For on call review [email or web address].

## 2023-07-03 NOTE — Progress Notes (Signed)
PHARMACY - PHYSICIAN COMMUNICATION CRITICAL VALUE ALERT - BLOOD CULTURE IDENTIFICATION (BCID)  Johnathan Bruna. is an 78 y.o. male who presented to [facility] on 07/02/2023 with a chief complaint of weakness, decreased oral intake and dysuria  Assessment:  blood culture from 1/23 with GPC in 1 of 2 sets (both bottles on the one set), BCID detects staphylococcus species (Not S aureus or S epidermidis).    Name of physician (or Provider) Contacted: Dr Clide Dales  Current antibiotics: Ceftriaxone  Changes to prescribed antibiotics recommended:  Blood culture likely contaminant, monitor on current therapy  Results for orders placed or performed during the hospital encounter of 07/02/23  Blood Culture ID Panel (Reflexed) (Collected: 07/02/2023 12:20 PM)  Result Value Ref Range   Enterococcus faecalis NOT DETECTED NOT DETECTED   Enterococcus Faecium NOT DETECTED NOT DETECTED   Listeria monocytogenes NOT DETECTED NOT DETECTED   Staphylococcus species DETECTED (A) NOT DETECTED   Staphylococcus aureus (BCID) NOT DETECTED NOT DETECTED   Staphylococcus epidermidis NOT DETECTED NOT DETECTED   Staphylococcus lugdunensis NOT DETECTED NOT DETECTED   Streptococcus species NOT DETECTED NOT DETECTED   Streptococcus agalactiae NOT DETECTED NOT DETECTED   Streptococcus pneumoniae NOT DETECTED NOT DETECTED   Streptococcus pyogenes NOT DETECTED NOT DETECTED   A.calcoaceticus-baumannii NOT DETECTED NOT DETECTED   Bacteroides fragilis NOT DETECTED NOT DETECTED   Enterobacterales NOT DETECTED NOT DETECTED   Enterobacter cloacae complex NOT DETECTED NOT DETECTED   Escherichia coli NOT DETECTED NOT DETECTED   Klebsiella aerogenes NOT DETECTED NOT DETECTED   Klebsiella oxytoca NOT DETECTED NOT DETECTED   Klebsiella pneumoniae NOT DETECTED NOT DETECTED   Proteus species NOT DETECTED NOT DETECTED   Salmonella species NOT DETECTED NOT DETECTED   Serratia marcescens NOT DETECTED NOT DETECTED   Haemophilus  influenzae NOT DETECTED NOT DETECTED   Neisseria meningitidis NOT DETECTED NOT DETECTED   Pseudomonas aeruginosa NOT DETECTED NOT DETECTED   Stenotrophomonas maltophilia NOT DETECTED NOT DETECTED   Candida albicans NOT DETECTED NOT DETECTED   Candida auris NOT DETECTED NOT DETECTED   Candida glabrata NOT DETECTED NOT DETECTED   Candida krusei NOT DETECTED NOT DETECTED   Candida parapsilosis NOT DETECTED NOT DETECTED   Candida tropicalis NOT DETECTED NOT DETECTED   Cryptococcus neoformans/gattii NOT DETECTED NOT DETECTED   Juliette Alcide, PharmD, BCPS, BCIDP Work Cell: [phone] 07/03/2023 10:10 AM

## 2023-07-04 DIAGNOSIS — R262 Difficulty in walking, not elsewhere classified: Secondary | ICD-10-CM | POA: Diagnosis not present

## 2023-07-04 DIAGNOSIS — N3000 Acute cystitis without hematuria: Secondary | ICD-10-CM | POA: Diagnosis not present

## 2023-07-04 DIAGNOSIS — R7881 Bacteremia: Secondary | ICD-10-CM | POA: Diagnosis not present

## 2023-07-04 DIAGNOSIS — R531 Weakness: Secondary | ICD-10-CM | POA: Diagnosis not present

## 2023-07-04 LAB — GLUCOSE, CAPILLARY
Glucose-Capillary: 113 mg/dL — ABNORMAL HIGH (ref 70–99)
Glucose-Capillary: 147 mg/dL — ABNORMAL HIGH (ref 70–99)
Glucose-Capillary: 136 mg/dL — ABNORMAL HIGH (ref 70–99)
Glucose-Capillary: 130 mg/dL — ABNORMAL HIGH (ref 70–99)

## 2023-07-04 MED ORDER — FUROSEMIDE 20 MG PO TABS: 20.0000 mg | ORAL_TABLET | Freq: Every day | ORAL | Status: DC | PRN

## 2023-07-04 MED ORDER — SODIUM BICARBONATE 650 MG PO TABS
650.0000 mg | ORAL_TABLET | Freq: Two times a day (BID) | ORAL | Status: DC
Start: 2023-07-04 — End: 2023-07-05
  Administered 2023-07-04 – 2023-07-05 (×2): 650 mg via ORAL
  Filled 2023-07-04 (×2): qty 1

## 2023-07-04 NOTE — Progress Notes (Signed)
Progress Note   Patient: Johnathan Arnold. ZOX:[id number] DOB: 1946/03/10 DOA: 07/02/2023     2 DOS: the patient was seen and examined on 07/04/2023   Brief hospital course: Johnathan Arnold. is a 78 y.o. male with medical history significant of CAD/CABG, HTN, CKD stage IIIa, Parkinson's disease, presented with worsening of generalized weakness.  Patient is admitted to the hospitalist service for worsening generalized weakness in the setting of UTI.  Assessment and Plan: Generalized weakness- Ambulatory impairment- Multifactorial in the setting of Parkinson's disease, UTI. Continue entacapone, Sinemet therapy. PT OT follow up for safe dc plan.  Positive blood cultures with staph species. 1/2 bottle with staph hemolyticus, epidermidis, this is likely contamination. Repeat cultures ordered for tomorrow. Continue rocephin for UTI.  UTI- continue Rocephin therapy for 3 days. No urine cultures obtained.  Acute on CKD stage IIIa. Patient's kidney function improved with gentle hydration. Avoid nephrotoxic drugs. Monitor daily renal function. Metabolic acidosis noted.  Will give sodium bicarb.  CAD with history of CABG- No active chest pain. Continue aspirin, statin.  Hypertension: Blood pressure stable. Hold losartan due to AKI. Will start Lasix from tomorrow.     Out of bed to chair. Incentive spirometry. Nursing supportive care. Fall, aspiration precautions. DVT prophylaxis   Code Status: Full Code  Subjective: Patient is seen and examined today morning.  Patient is lying in bed, wife at bedside.  He feels better, ate fair. Asks about discharge plan.   Physical Exam: Vitals:   07/03/23 1141 07/03/23 1529 07/03/23 1959 07/04/23 0803  BP: (!) 140/54 (!) 145/57 (!) 151/59 (!) 159/63  Pulse: 60 66 66 (!) 54  Resp: 18 18 20 16   Temp: (!) 96.9 F (36.1 C) 98.2 F (36.8 C) 97.7 F (36.5 C) 97.9 F (36.6 C)  TempSrc: Axillary  Oral Oral  SpO2: 100% 100% 98% 98%   Weight:      Height:        General - Elderly Caucasian male, no apparent distress HEENT - PERRLA, EOMI, atraumatic head, non tender sinuses. Lung - Clear, diffuse rales, rhonchi, wheezes. Heart - S1, S2 heard, no murmurs, rubs, trace pedal edema. Abdomen - Soft, non tender, bowel sounds good Neuro - Alert, awake and oriented, lower extremity weak Skin - Warm and dry.  Data Reviewed:      Latest Ref Rng & Units 07/03/2023    6:18 AM 07/02/2023   10:45 AM 05/22/2023    4:31 PM  CBC  WBC 4.0 - 10.5 K/uL 5.0  5.4  5.2   Hemoglobin 13.0 - 17.0 g/dL 81.1  91.4  78.2   Hematocrit 39.0 - 52.0 % 30.9  36.6  32.8   Platelets 150 - 400 K/uL 129  152  189       Latest Ref Rng & Units 07/03/2023    6:18 AM 07/02/2023   10:45 AM 05/22/2023    4:31 PM  BMP  Glucose 70 - 99 mg/dL 62  72  956   BUN 8 - 23 mg/dL 26  36  20   Creatinine 0.61 - 1.24 mg/dL 2.13  0.86  5.78   Sodium 135 - 145 mmol/L 139  137  136   Potassium 3.5 - 5.1 mmol/L 4.2  4.5  4.8   Chloride 98 - 111 mmol/L 106  100  103   CO2 22 - 32 mmol/L 18  19  25    Calcium 8.9 - 10.3 mg/dL 8.9  9.1  8.8  No results found.  Family Communication: Discussed with patient, wife at bedside, they understand and agree. All questions answereed.  Disposition: Status is: Inpatient Remains inpatient appropriate because: weakness, UTI  Planned Discharge Destination: Home with Home Health     Time spent: 38 minutes  Author: Marcelino Duster, MD 07/04/2023 3:19 PM Secure chat 7am to 7pm For on call review [email or web address].

## 2023-07-05 DIAGNOSIS — R531 Weakness: Secondary | ICD-10-CM | POA: Diagnosis not present

## 2023-07-05 DIAGNOSIS — N3000 Acute cystitis without hematuria: Secondary | ICD-10-CM | POA: Diagnosis not present

## 2023-07-05 DIAGNOSIS — R262 Difficulty in walking, not elsewhere classified: Secondary | ICD-10-CM | POA: Diagnosis not present

## 2023-07-05 DIAGNOSIS — R7881 Bacteremia: Secondary | ICD-10-CM | POA: Diagnosis not present

## 2023-07-05 LAB — CBC
HCT: 33.1 % — ABNORMAL LOW (ref 39.0–52.0)
Hemoglobin: 11.5 g/dL — ABNORMAL LOW (ref 13.0–17.0)
MCH: 34.8 pg — ABNORMAL HIGH (ref 26.0–34.0)
MCHC: 34.7 g/dL (ref 30.0–36.0)
MCV: 100.3 fL — ABNORMAL HIGH (ref 80.0–100.0)
Platelets: 123 10*3/uL — ABNORMAL LOW (ref 150–400)
RBC: 3.3 MIL/uL — ABNORMAL LOW (ref 4.22–5.81)
RDW: 12.6 % (ref 11.5–15.5)
WBC: 3.8 10*3/uL — ABNORMAL LOW (ref 4.0–10.5)
nRBC: 0 % (ref 0.0–0.2)

## 2023-07-05 LAB — BASIC METABOLIC PANEL
Anion gap: 10 (ref 5–15)
BUN: 14 mg/dL (ref 8–23)
CO2: 24 mmol/L (ref 22–32)
Calcium: 9.2 mg/dL (ref 8.9–10.3)
Chloride: 103 mmol/L (ref 98–111)
Creatinine, Ser: 1.34 mg/dL — ABNORMAL HIGH (ref 0.61–1.24)
GFR, Estimated: 54 mL/min — ABNORMAL LOW (ref >60)
Glucose, Bld: 115 mg/dL — ABNORMAL HIGH (ref 70–99)
Potassium: 4.2 mmol/L (ref 3.5–5.1)
Sodium: 137 mmol/L (ref 135–145)

## 2023-07-05 LAB — GLUCOSE, CAPILLARY
Glucose-Capillary: 149 mg/dL — ABNORMAL HIGH (ref 70–99)
Glucose-Capillary: 124 mg/dL — ABNORMAL HIGH (ref 70–99)
Glucose-Capillary: 110 mg/dL — ABNORMAL HIGH (ref 70–99)

## 2023-07-05 NOTE — Plan of Care (Signed)

## 2023-07-05 NOTE — Progress Notes (Signed)
Progress Note   Patient: Johnathan Arnold. RUE:[id number] DOB: 11/11/1945 DOA: 07/02/2023     3 DOS: the patient was seen and examined on 07/05/2023   Brief hospital course: Johnathan Arnold. is a 78 y.o. male with medical history significant of CAD/CABG, HTN, CKD stage IIIa, Parkinson's disease, presented with worsening of generalized weakness.  Patient is admitted to the hospitalist service for worsening generalized weakness in the setting of UTI.  Assessment and Plan: Generalized weakness- Ambulatory impairment- More sleepy and lethargic today - hold sedative medications. Continue neurochecks. Multifactorial in the setting of Parkinson's disease, UTI. Continue entacapone, Sinemet therapy. PT OT follow up for safe dc plan.  Positive blood cultures with staph species. 1/2 bottle with staph hemolyticus, epidermidis, this is likely contamination. Repeat cultures ordered for tomorrow. Continue rocephin for UTI.  UTI- continue Rocephin therapy for 3 days. No urine cultures obtained.  Acute on CKD stage IIIa. Kidney function better, encourage oral fluids. Avoid nephrotoxic drugs. Monitor daily renal function. Metabolic acidosis improved  CAD with history of CABG- No active chest pain. Continue aspirin, statin.  Hypertension: Blood pressure stable. Hold losartan due to AKI. Hold Lasix as he is eating poor.    Out of bed to chair. Incentive spirometry. Nursing supportive care. Fall, aspiration precautions. DVT prophylaxis   Code Status: Full Code  Subjective: Patient is seen and examined today morning.  Patient is more sleepy today, wife at bedside.  Encouraged him to eat. Did not get out of bed.  Physical Exam: Vitals:   07/04/23 2007 07/05/23 0610 07/05/23 1003 07/05/23 1257  BP: (!) 152/61 (!) 196/68 (!) 166/57 (!) 161/64  Pulse: 62 (!) 57 (!) 53 61  Resp: 16 (!) 24 20   Temp: 97.9 F (36.6 C) 98.5 F (36.9 C) 98.7 F (37.1 C)   TempSrc: Oral Oral Oral    SpO2: 97% 100% 100% 98%  Weight:      Height:        General - Elderly Caucasian male, no apparent distress HEENT - PERRLA, EOMI, atraumatic head, non tender sinuses. Lung - Clear, diffuse rales, rhonchi, wheezes. Heart - S1, S2 heard, no murmurs, rubs, trace pedal edema. Abdomen - Soft, non tender, bowel sounds good Neuro - Alert, awake and oriented, lower extremity weak Skin - Warm and dry.  Data Reviewed:      Latest Ref Rng & Units 07/05/2023    6:11 AM 07/03/2023    6:18 AM 07/02/2023   10:45 AM  CBC  WBC 4.0 - 10.5 K/uL 3.8  5.0  5.4   Hemoglobin 13.0 - 17.0 g/dL 14.7  82.9  56.2   Hematocrit 39.0 - 52.0 % 33.1  30.9  36.6   Platelets 150 - 400 K/uL 123  129  152       Latest Ref Rng & Units 07/05/2023    6:11 AM 07/03/2023    6:18 AM 07/02/2023   10:45 AM  BMP  Glucose 70 - 99 mg/dL 130  62  72   BUN 8 - 23 mg/dL 14  26  36   Creatinine 0.61 - 1.24 mg/dL 8.65  7.84  6.96   Sodium 135 - 145 mmol/L 137  139  137   Potassium 3.5 - 5.1 mmol/L 4.2  4.2  4.5   Chloride 98 - 111 mmol/L 103  106  100   CO2 22 - 32 mmol/L 24  18  19    Calcium 8.9 - 10.3 mg/dL 9.2  8.9  9.1    No results found.  Family Communication: Discussed with patient, wife at bedside, they understand and agree. All questions answereed.  Disposition: Status is: Inpatient Remains inpatient appropriate because: weakness, UTI  Planned Discharge Destination: Home with Home Health and Rehab     Time spent: 39 minutes  Author: Marcelino Duster, MD 07/05/2023 1:18 PM Secure chat 7am to 7pm For on call review [email or web address].

## 2023-07-05 NOTE — TOC CM/SW Note (Signed)
Transition of Care [facility]) - Inpatient Brief Assessment   Patient Details  Name: Johnathan Arnold. MRN: [id number] Date of Birth: 11-21-1945  Transition [facility]) CM/SW Contact:    Rodney Langton, RN Phone Number: 07/05/2023, 1:52 PM   Clinical Narrative:  Brief assessment done.  Noted that patient has been active with outpatient PT services at [facility], recommendations are to continue current treatment. TOC team will continue to follow for potential needs.    Transition of Care Asessment: Insurance and Status: Insurance coverage has been reviewed Patient has primary care physician: Yes Home environment has been reviewed: Yes Prior level of function:: Indpendent, active with OPPT Prior/Current Home Services: No current home services Social Drivers of Health Review: SDOH reviewed no interventions necessary Readmission risk has been reviewed: Yes Transition of care needs: transition of care needs identified, TOC will continue to follow

## 2023-07-05 NOTE — Plan of Care (Signed)

## 2023-07-06 DIAGNOSIS — R531 Weakness: Secondary | ICD-10-CM | POA: Diagnosis not present

## 2023-07-06 DIAGNOSIS — R262 Difficulty in walking, not elsewhere classified: Secondary | ICD-10-CM | POA: Diagnosis not present

## 2023-07-06 DIAGNOSIS — R7881 Bacteremia: Secondary | ICD-10-CM | POA: Diagnosis not present

## 2023-07-06 DIAGNOSIS — N3000 Acute cystitis without hematuria: Secondary | ICD-10-CM | POA: Diagnosis not present

## 2023-07-06 LAB — CULTURE, BLOOD (ROUTINE X 2): Special Requests: ADEQUATE

## 2023-07-06 LAB — GLUCOSE, CAPILLARY
Glucose-Capillary: 116 mg/dL — ABNORMAL HIGH (ref 70–99)
Glucose-Capillary: 131 mg/dL — ABNORMAL HIGH (ref 70–99)
Glucose-Capillary: 96 mg/dL (ref 70–99)

## 2023-07-06 NOTE — Progress Notes (Signed)
Physical Therapy Treatment Patient Details Name: Johnathan Arnold. MRN: [id number] DOB: [birth year] Today's Date: 07/06/2023   History of Present Illness 78 y.o. male presented with worsening of generalized weakness. MD assessment includes: acute on chronic ambulation impairment, AKI on CKD IIIa, and UTI. PMHx: CAD/CABG, HTN, CKD stage IIIa, Parkinson's disease-BLE tremors.    PT Comments  Pt lethargic but awakes with min verbal and tactile stimuli, less conversive this session with increased cues to follow commands.  Pt required increased assistance with all functional tasks compared to prior session and was only able to walk short bouts between seated rest breaks with significant cuing for sequencing and increased step length.  Pt was generally steady in standing but did require min A for stability on two occasions.  MD and nursing notified of decline in functional status.  Pt will benefit from continued PT services upon discharge to safely address deficits listed in patient problem list for decreased caregiver assistance and eventual return to PLOF.    If plan is discharge home, recommend the following: Assistance with cooking/housework;Assist for transportation;Help with stairs or ramp for entrance;A lot of help with walking and/or transfers;A lot of help with bathing/dressing/bathroom   Can travel by private vehicle     No  Equipment Recommendations  None recommended by PT    Recommendations for Other Services       Precautions / Restrictions Precautions Precautions: Fall Restrictions Weight Bearing Restrictions Per Provider Order: No     Mobility  Bed Mobility Overal bed mobility: Needs Assistance Bed Mobility: Supine to Sit     Supine to sit: Min assist, Mod assist     General bed mobility comments: Min A for BLE and trunk control to come to sitting and then Mod A to get scooted forward to the EOB    Transfers Overall transfer level: Needs assistance Equipment used:  Rolling walker (2 wheels) Transfers: Sit to/from Stand Sit to Stand: From elevated surface, Mod assist           General transfer comment: Multi-modal cues for sequencing with mod A to come to full upright standing    Ambulation/Gait Ambulation/Gait assistance: Min assist Gait Distance (Feet): 5 Feet x 3 Assistive device: Rolling walker (2 wheels) Gait Pattern/deviations: Step-through pattern, Decreased step length - right, Decreased step length - left, Shuffle Gait velocity: decreased     General Gait Details: Heavy cues for sequencing with very short often shuffling steps and min A for stability   Stairs             Wheelchair Mobility     Tilt Bed    Modified Rankin (Stroke Patients Only)       Balance Overall balance assessment: Needs assistance   Sitting balance-Leahy Scale: Fair     Standing balance support: Bilateral upper extremity supported, During functional activity, Reliant on assistive device for balance Standing balance-Leahy Scale: Poor Standing balance comment: Min A for stability in standing                            Cognition Arousal: Lethargic Behavior During Therapy: WFL for tasks assessed/performed Overall Cognitive Status: Within Functional Limits for tasks assessed                                          Exercises  General Comments        Pertinent Vitals/Pain Pain Assessment Pain Assessment: No/denies pain    Home Living                          Prior Function            PT Goals (current goals can now be found in the care plan section) Progress towards PT goals: Not progressing toward goals - comment (decline in functional status this session)    Frequency    Min 1X/week      PT Plan      Co-evaluation              AM-PAC PT "6 Clicks" Mobility   Outcome Measure  Help needed turning from your back to your side while in a flat bed without using  bedrails?: A Little Help needed moving from lying on your back to sitting on the side of a flat bed without using bedrails?: A Lot Help needed moving to and from a bed to a chair (including a wheelchair)?: A Lot Help needed standing up from a chair using your arms (e.g., wheelchair or bedside chair)?: A Lot Help needed to walk in hospital room?: A Lot Help needed climbing 3-5 steps with a railing? : Total 6 Click Score: 12    End of Session Equipment Utilized During Treatment: Gait belt Activity Tolerance: Patient tolerated treatment well Patient left: in chair;with call bell/phone within reach;with chair alarm set;with family/visitor present Nurse Communication: Mobility status (MD and nursing notified of decline in functional status) PT Visit Diagnosis: Unsteadiness on feet (R26.81);History of falling (Z91.81);Difficulty in walking, not elsewhere classified (R26.2);Muscle weakness (generalized) (M62.81)     Time: 5784-6962 PT Time Calculation (min) (ACUTE ONLY): 22 min  Charges:    $Gait Training: 8-22 mins PT General Charges $$ ACUTE PT VISIT: 1 Visit                     D. Scott Cherri Yera PT, DPT 07/06/23, 11:07 AM

## 2023-07-06 NOTE — Care Management Important Message (Signed)
Important Message  Patient Details  Name: Johnathan Arnold. MRN: [id number] Date of Birth: [birth year]   Important Message Given:  Yes - Medicare IM     Sherilyn Banker 07/06/2023, 11:14 AM

## 2023-07-06 NOTE — Progress Notes (Signed)
Progress Note   Patient: Johnathan Arnold. ZOX:[id number] DOB: [birth year] DOA: 07/02/2023     4 DOS: the patient was seen and examined on 07/06/2023   Brief hospital course: Ky Rumple. is a 78 y.o. male with medical history significant of CAD/CABG, HTN, CKD stage IIIa, Parkinson's disease, presented with worsening of generalized weakness.  Patient is admitted to the hospitalist service for worsening generalized weakness in the setting of UTI, Parkinson disease.  Assessment and Plan: Generalized weakness- Ambulatory impairment- Weak, able to answer slowly. Multifactorial in the setting of Parkinson's disease, UTI. PT advised SNF placement. Wife wishes to take him home. Not safe to go home with [facility].  Continue neurochecks. Continue entacapone, Sinemet therapy. SNF placement process initiated.  Positive blood cultures with staph species. likely contamination. Repeat cultures so far negative.  UTI- Finished Rocephin therapy. No urine cultures obtained.  Acute on CKD stage IIIa. Kidney function improved, encourage oral fluids. Avoid nephrotoxic drugs. Monitor daily renal function. Metabolic acidosis improved  CAD with history of CABG- No active chest pain. Continue aspirin, statin.  Hypertension: Blood pressure stable. Hold losartan due to AKI. Hold Lasix as he is eating poor.    Out of bed to chair. Incentive spirometry. Nursing supportive care. Fall, aspiration precautions. DVT prophylaxis   Code Status: Full Code  Subjective: Patient is seen and examined today morning.  Patient is sleepy but arousable, able to answer me, sitting in chair. Wife at bedside concerned about his weakness.  Encouraged him to eat.   Physical Exam: Vitals:   07/05/23 1257 07/05/23 1704 07/05/23 2021 07/06/23 0244  BP: (!) 161/64 (!) 160/57 (!) 182/67 (!) 149/60  Pulse: 61 (!) 56 (!) 54 (!) 55  Resp:  20 18 18   Temp:  98.8 F (37.1 C) 97.8 F (36.6 C) 98 F (36.7 C)   TempSrc:  Oral Oral Oral  SpO2: 98% 96% 98% 99%  Weight:      Height:        General - Elderly weak Caucasian male, sitting, no apparent distress HEENT - PERRLA, EOMI, atraumatic head, non tender sinuses. Lung - Clear, diffuse rales, rhonchi, wheezes. Heart - S1, S2 heard, no murmurs, rubs, trace pedal edema. Abdomen - Soft, non tender, bowel sounds good Neuro - Sleepy but arousable, weak, non focal exam Skin - Warm and dry.  Data Reviewed:      Latest Ref Rng & Units 07/05/2023    6:11 AM 07/03/2023    6:18 AM 07/02/2023   10:45 AM  CBC  WBC 4.0 - 10.5 K/uL 3.8  5.0  5.4   Hemoglobin 13.0 - 17.0 g/dL 81.1  91.4  78.2   Hematocrit 39.0 - 52.0 % 33.1  30.9  36.6   Platelets 150 - 400 K/uL 123  129  152       Latest Ref Rng & Units 07/05/2023    6:11 AM 07/03/2023    6:18 AM 07/02/2023   10:45 AM  BMP  Glucose 70 - 99 mg/dL 956  62  72   BUN 8 - 23 mg/dL 14  26  36   Creatinine 0.61 - 1.24 mg/dL 2.13  0.86  5.78   Sodium 135 - 145 mmol/L 137  139  137   Potassium 3.5 - 5.1 mmol/L 4.2  4.2  4.5   Chloride 98 - 111 mmol/L 103  106  100   CO2 22 - 32 mmol/L 24  18  19    Calcium 8.9 -  10.3 mg/dL 9.2  8.9  9.1    No results found.  Family Communication: Discussed with patient, wife at bedside, they understand and agree. All questions answereed.  Disposition: Status is: Inpatient Remains inpatient appropriate because: weakness, safe discharge plan  Planned Discharge Destination: Home with Home Health and Rehab     Time spent: 37 minutes  Author: Marcelino Duster, MD 07/06/2023 12:52 PM Secure chat 7am to 7pm For on call review [email or web address].

## 2023-07-06 NOTE — TOC Initial Note (Signed)
Transition of Care (TOC) - Initial/Assessment Note    Patient Details  Name: Johnathan Arnold. MRN: [id number] Date of Birth: [birth year]  Transition of Care [facility]) CM/SW Contact:    Margarito Liner, LCSW Phone Number: 07/06/2023, 12:49 PM  Clinical Narrative:   CSW met with patient. Wife at bedside. CSW introduced role and explained that PT recommendations would be discussed. Patient and wife are willing to consider SNF placement. CSW will send out referral to determine what his options are. PASARR under manual review. CSW uploaded requested clinicals. No further concerns. CSW will continue to follow patient and his wife for support and facilitate discharge once medically stable.                 Expected Discharge Plan: Skilled Nursing Facility Barriers to Discharge: Continued Medical Work up   Patient Goals and CMS Choice            Expected Discharge Plan and Services     Post Acute Care Choice:  (TBD) Living arrangements for the past 2 months: Single Family Home                                      Prior Living Arrangements/Services Living arrangements for the past 2 months: Single Family Home Lives with:: Spouse Patient language and need for interpreter reviewed:: Yes Do you feel safe going back to the place where you live?: Yes      Need for Family Participation in Patient Care: Yes (Comment) Care giver support system in place?: Yes (comment)   Criminal Activity/Legal Involvement Pertinent to Current Situation/Hospitalization: No - Comment as needed  Activities of Daily Living   ADL Screening (condition at time of admission) Independently performs ADLs?: No Does the patient have a NEW difficulty with bathing/dressing/toileting/self-feeding that is expected to last >3 days?: Yes (Initiates electronic notice to provider for possible OT consult) Does the patient have a NEW difficulty with getting in/out of bed, walking, or climbing stairs that is expected to  last >3 days?: Yes (Initiates electronic notice to provider for possible PT consult) Does the patient have a NEW difficulty with communication that is expected to last >3 days?: No Is the patient deaf or have difficulty hearing?: No Does the patient have difficulty seeing, even when wearing glasses/contacts?: No Does the patient have difficulty concentrating, remembering, or making decisions?: No  Permission Sought/Granted Permission sought to share information with : Facility Medical sales representative, Family Supports    Share Information with NAME: Kaydon Husby  Permission granted to share info w AGENCY: SNF's  Permission granted to share info w Relationship: Wife  Permission granted to share info w Contact Information: [phone]  Emotional Assessment Appearance:: Appears stated age Attitude/Demeanor/Rapport: Engaged Affect (typically observed): Appropriate, Calm Orientation: : Oriented to Self, Oriented to Place, Oriented to Situation Alcohol / Substance Use: Not Applicable Psych Involvement: No (comment)  Admission diagnosis:  Dehydration [E86.0] Weakness [R53.1] Elevated troponin [R79.89] AKI (acute kidney injury) (HCC) [N17.9] Urinary tract infection without hematuria, site unspecified [N39.0] Impaired ambulation [R26.2] Patient Active Problem List   Diagnosis Date Noted   UTI (urinary tract infection) 07/02/2023   Impaired ambulation 07/02/2023   Orthostatic hypotension 05/22/2023   Syncope and collapse 05/22/2023   Adverse drug reaction 01/20/2023   History of COVID-19 03/18/2022   History of diabetes mellitus, type II 03/18/2022   Hospital discharge follow-up 05/16/2021   Generalized weakness  05/07/2021   Fall    Head injury    Bradycardia    Anemia, unspecified 02/07/2021   Neutropenia (HCC) 01/29/2021   Thrombocytopenia (HCC) 01/29/2021   Hyponatremia 12/22/2020   Bilateral leg weakness 12/20/2020   Prostate cancer screening 09/08/2020   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) 09/06/2020   Low back pain 08/19/2019   Pulmonary hypertension (HCC)    Insomnia 12/21/2018   Sleep apnea in adult 12/09/2018   Periodic limb movement disorder 12/09/2018   Pulmonary nodules 05/18/2018   Wears hearing aid in both ears 05/17/2018   Leg pain, bilateral 02/20/2018   Dyspnea    CKD (chronic kidney disease) stage 3, GFR 30-59 ml/min (HCC) 09/21/2017   History of skin cancer in adulthood 08/14/2016   Parkinson's disease (HCC) 08/09/2016   Bilateral carotid artery stenosis 04/02/2015   Vertigo, peripheral 10/17/2014   Benign prostatic hyperplasia with urinary frequency 01/31/2014   Encounter for Medicare annual wellness exam 07/02/2013   Obesity 04/03/2013   Other malaise and fatigue 09/21/2012   Hyperlipidemia    Hypertension    3-vessel coronary artery disease    S/P CABG x 5    PCP:  [name], MD Pharmacy:   [facility] [id number] [place], [place] - [address] [place] [address] Phone: [phone] Fax: [phone]     Social Drivers of Health (SDOH) Social History: SDOH Screenings   Food Insecurity: No Food Insecurity (07/02/2023)  Housing: Low Risk  (07/02/2023)  Transportation Needs: No Transportation Needs (07/02/2023)  Utilities: Not At Risk (07/02/2023)  Alcohol Screen: Low Risk  (05/19/2023)  Depression (PHQ2-9): Low Risk  (01/20/2023)  Financial Resource Strain: Low Risk  (05/19/2023)  Physical Activity: Sufficiently Active (05/19/2023)  Social Connections: Socially Integrated (07/02/2023)  Stress: No Stress Concern Present (05/19/2023)  Tobacco Use: Medium Risk (07/02/2023)   SDOH Interventions:     Readmission Risk Interventions     No data to display

## 2023-07-06 NOTE — [place] FL2 (Signed)
[place] MEDICAID FL2 LEVEL OF CARE FORM     IDENTIFICATION  Patient Name: Johnathan Arnold. Birthdate: [birth year] Sex: male Admission Date (Current Location): 07/02/2023  [facility] and [place] Number:  Chiropodist and Address:  [facility] [address] [place], [place] [address]      Provider Number: [id number]  Attending Physician Name and Address:  Marcelino Duster, MD  Relative Name and Phone Number:       Current Level of Care: Hospital Recommended Level of Care: Skilled Nursing Facility Prior Approval Number:    Date Approved/Denied:   PASRR Number: Manual review  Discharge Plan: SNF    Current Diagnoses: Patient Active Problem List   Diagnosis Date Noted   UTI (urinary tract infection) 07/02/2023   Impaired ambulation 07/02/2023   Orthostatic hypotension 05/22/2023   Syncope and collapse 05/22/2023   Adverse drug reaction 01/20/2023   History of COVID-19 03/18/2022   History of diabetes mellitus, type II 03/18/2022   Hospital discharge follow-up 05/16/2021   Generalized weakness 05/07/2021   Fall    Head injury    Bradycardia    Anemia, unspecified 02/07/2021   Neutropenia (HCC) 01/29/2021   Thrombocytopenia (HCC) 01/29/2021   Hyponatremia 12/22/2020   Bilateral leg weakness 12/20/2020   Prostate cancer screening 09/08/2020   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) 09/06/2020   Low back pain 08/19/2019   Pulmonary hypertension (HCC)    Insomnia 12/21/2018   Sleep apnea in adult 12/09/2018   Periodic limb movement disorder 12/09/2018   Pulmonary nodules 05/18/2018   Wears hearing aid in both ears 05/17/2018   Leg pain, bilateral 02/20/2018   Dyspnea    CKD (chronic kidney disease) stage 3, GFR 30-59 ml/min (HCC) 09/21/2017   History of skin cancer in adulthood 08/14/2016   Parkinson's disease (HCC) 08/09/2016   Bilateral carotid artery stenosis 04/02/2015   Vertigo, peripheral  10/17/2014   Benign prostatic hyperplasia with urinary frequency 01/31/2014   Encounter for Medicare annual wellness exam 07/02/2013   Obesity 04/03/2013   Other malaise and fatigue 09/21/2012   Hyperlipidemia    Hypertension    3-vessel coronary artery disease    S/P CABG x 5     Orientation RESPIRATION BLADDER Height & Weight     Self, Situation, Place  Normal Incontinent Weight: 163 lb 2.3 oz (74 kg) Height:  5\' 6"  (167.6 cm)  BEHAVIORAL SYMPTOMS/MOOD NEUROLOGICAL BOWEL NUTRITION STATUS   (None)  (Parkinson's) Continent Diet (Heart healthy)  AMBULATORY STATUS COMMUNICATION OF NEEDS Skin   Limited Assist Verbally Normal (None)                       Personal Care Assistance Level of Assistance  Bathing, Feeding, Dressing Bathing Assistance: Limited assistance Feeding assistance: Limited assistance Dressing Assistance: Limited assistance     Functional Limitations Info  Sight, Hearing, Speech Sight Info: Adequate Hearing Info: Adequate Speech Info: Adequate    SPECIAL CARE FACTORS FREQUENCY  PT (By licensed PT), OT (By licensed OT)     PT Frequency: 5 x week OT Frequency: 5 x week            Contractures Contractures Info: Not present    Additional Factors Info  Code Status, Allergies Code Status Info: Full code Allergies Info: Paxlovid (Nirmatrelvir-ritonavir)           Current Medications (07/06/2023):  This is the current hospital active medication list Current Facility-Administered Medications  Medication Dose Route Frequency Provider Last Rate Last Admin   acetaminophen (TYLENOL) tablet 500 mg  500 mg Oral PRN Mikey College T, MD   500 mg at 07/02/23 2151   aspirin EC tablet 81 mg  81 mg Oral QHS Mikey College T, MD   81 mg at 07/05/23 2129   carbidopa-levodopa (SINEMET CR) 50-200 MG per tablet controlled release 1 tablet  1 tablet Oral QHS Mikey College T, MD   1 tablet at 07/05/23 2129   carbidopa-levodopa (SINEMET IR) 25-100 MG per tablet immediate  release 1 tablet  1 tablet Oral Daily Lowella Bandy, RPH   1 tablet at 07/05/23 1703   carbidopa-levodopa (SINEMET IR) 25-100 MG per tablet immediate release 2 tablet  2 tablet Oral TID Lowella Bandy, RPH   2 tablet at 07/06/23 1104   entacapone (COMTAN) tablet 200 mg  200 mg Oral BID Lowella Bandy, RPH   200 mg at 07/06/23 1106   heparin injection 5,000 Units  5,000 Units Subcutaneous Q12H Mikey College T, MD   5,000 Units at 07/06/23 1100   isosorbide mononitrate (IMDUR) 24 hr tablet 60 mg  60 mg Oral Daily Mikey College T, MD   60 mg at 07/06/23 1101   pantoprazole (PROTONIX) EC tablet 40 mg  40 mg Oral Daily Mikey College T, MD   40 mg at 07/06/23 1059   polyethylene glycol (MIRALAX / GLYCOLAX) packet 17 g  17 g Oral Daily PRN Mikey College T, MD       ranolazine (RANEXA) 12 hr tablet 1,000 mg  1,000 mg Oral BID Mikey College T, MD   1,000 mg at 07/06/23 1101   rosuvastatin (CRESTOR) tablet 10 mg  10 mg Oral Daily Mikey College T, MD   10 mg at 07/06/23 1058   tamsulosin (FLOMAX) capsule 0.4 mg  0.4 mg Oral Daily Mikey College T, MD   0.4 mg at 07/06/23 1059     Discharge Medications: Please see discharge summary for a list of discharge medications.  Relevant Imaging Results:  Relevant Lab Results:   Additional Information SS#: [id number]  Margarito Liner, LCSW

## 2023-07-06 NOTE — TOC PASRR Note (Signed)
RE: Johnathan Arnold Date of Birth: 04/20/1946 Date: 07/05/2022   To Whom It May Concern:  Please be advised that the above-named patient will require a short-term nursing home stay - anticipated 30 days or less for rehabilitation and strengthening.  The plan is for return home.

## 2023-07-07 ENCOUNTER — Ambulatory Visit: Payer: Medicare Other | Admitting: Physical Therapy

## 2023-07-07 DIAGNOSIS — N3000 Acute cystitis without hematuria: Secondary | ICD-10-CM | POA: Diagnosis not present

## 2023-07-07 DIAGNOSIS — R531 Weakness: Secondary | ICD-10-CM | POA: Diagnosis not present

## 2023-07-07 DIAGNOSIS — R262 Difficulty in walking, not elsewhere classified: Secondary | ICD-10-CM | POA: Diagnosis not present

## 2023-07-07 DIAGNOSIS — R7881 Bacteremia: Secondary | ICD-10-CM | POA: Diagnosis not present

## 2023-07-07 LAB — GLUCOSE, CAPILLARY
Glucose-Capillary: 105 mg/dL — ABNORMAL HIGH (ref 70–99)
Glucose-Capillary: 110 mg/dL — ABNORMAL HIGH (ref 70–99)
Glucose-Capillary: 111 mg/dL — ABNORMAL HIGH (ref 70–99)

## 2023-07-07 LAB — RESPIRATORY PANEL BY PCR

## 2023-07-07 LAB — CULTURE, BLOOD (ROUTINE X 2)
Culture: NO GROWTH
Special Requests: ADEQUATE

## 2023-07-07 MED ORDER — CIPROFLOXACIN HCL 0.3 % OP SOLN
1.0000 [drp] | OPHTHALMIC | Status: DC
  Administered 2023-07-07 – 2023-07-10 (×15): 1 [drp] via OPHTHALMIC
  Filled 2023-07-07 (×2): qty 2.5

## 2023-07-07 NOTE — Progress Notes (Signed)
Progress Note   Patient: Johnathan Arnold. EAV:[id number] DOB: Aug 13, 1945 DOA: 07/02/2023     5 DOS: the patient was seen and examined on 07/07/2023   Brief hospital course: Johnathan Arnold. is a 78 y.o. male with medical history significant of CAD/CABG, HTN, CKD stage IIIa, Parkinson's disease, presented with worsening of generalized weakness.  Patient is admitted to the hospitalist service for worsening generalized weakness in the setting of UTI, Parkinson disease.  Assessment and Plan: Generalized weakness- Ambulatory impairment- Weak, able to answer slowly. Multifactorial in the setting of Parkinson's disease, UTI. He thinks Entacapone causing symptoms, and wishes to stop it. Continue neurochecks. Continue Sinemet therapy. TOC working on SNF placement.  Positive blood cultures with staph species. likely contamination. Repeat cultures so far negative. Respiratory panel ordered.  UTI- Finished Rocephin therapy. No urine cultures obtained.  Acute on CKD stage IIIa. Kidney function improved, encourage oral fluids. Avoid nephrotoxic drugs. Monitor daily renal function. Metabolic acidosis improved  CAD with history of CABG- No active chest pain. Continue aspirin, statin.  Hypertension: Blood pressure stable. Hold losartan due to AKI. Hold Lasix as he is eating poor.    Out of bed to chair. Incentive spirometry. Nursing supportive care. Fall, aspiration precautions. DVT prophylaxis   Code Status: Full Code  Subjective: Patient is seen and examined today morning.  Patient is more alert today, able to answer me, lying in bed. He thinks Entacapone started recently causing his to be weak. Wife at bedside waxing waning mental status.  Encouraged him to eat, work with PT.  Physical Exam: Vitals:   07/06/23 0244 07/06/23 2034 07/07/23 0411 07/07/23 0939  BP: (!) 149/60 (!) 125/44 (!) 191/68 (!) 194/64  Pulse: (!) 55 (!) 57 64 (!) 57  Resp: 18 20 20 20   Temp: 98 F  (36.7 C) 98 F (36.7 C) 98.1 F (36.7 C) 97.7 F (36.5 C)  TempSrc: Oral Oral Oral Oral  SpO2: 99% 97% 99% 99%  Weight:      Height:        General - Elderly weak Caucasian male, sitting, no apparent distress HEENT - PERRLA, EOMI, atraumatic head, non tender sinuses. Lung - Clear, diffuse rales, rhonchi, wheezes. Heart - S1, S2 heard, no murmurs, rubs, trace pedal edema. Abdomen - Soft, non tender, bowel sounds good Neuro - alert, awake and oriented, weak, non focal exam Skin - Warm and dry.  Data Reviewed:      Latest Ref Rng & Units 07/05/2023    6:11 AM 07/03/2023    6:18 AM 07/02/2023   10:45 AM  CBC  WBC 4.0 - 10.5 K/uL 3.8  5.0  5.4   Hemoglobin 13.0 - 17.0 g/dL 78.2  95.6  21.3   Hematocrit 39.0 - 52.0 % 33.1  30.9  36.6   Platelets 150 - 400 K/uL 123  129  152       Latest Ref Rng & Units 07/05/2023    6:11 AM 07/03/2023    6:18 AM 07/02/2023   10:45 AM  BMP  Glucose 70 - 99 mg/dL 086  62  72   BUN 8 - 23 mg/dL 14  26  36   Creatinine 0.61 - 1.24 mg/dL 5.78  4.69  6.29   Sodium 135 - 145 mmol/L 137  139  137   Potassium 3.5 - 5.1 mmol/L 4.2  4.2  4.5   Chloride 98 - 111 mmol/L 103  106  100   CO2 22 - 32  mmol/L 24  18  19    Calcium 8.9 - 10.3 mg/dL 9.2  8.9  9.1    No results found.  Family Communication: Discussed with patient, wife at bedside, they understand and agree. All questions answereed.  Disposition: Status is: Inpatient Remains inpatient appropriate because: weakness, safe discharge plan  Planned Discharge Destination: Home with Home Health and Rehab     Time spent: 39 minutes  Author: Marcelino Duster, MD 07/07/2023 1:30 PM Secure chat 7am to 7pm For on call review [email or web address].

## 2023-07-07 NOTE — TOC Progression Note (Signed)
Transition of Care (TOC) - Progression Note    Patient Details  Name: Johnathan Arnold. MRN: [id number] Date of Birth: [birth year]  Transition of Care [facility]) CM/SW Contact  Chapman Fitch, RN Phone Number: 07/07/2023, 6:16 PM  Clinical Narrative:     Bed offers provided to patient and wife.  Copy of bed offers printed, and bedside RN to provide to patient.  Wife and patient to discuss and request that [facility] follow up tomorrow   Expected Discharge Plan: Skilled Nursing Facility Barriers to Discharge: Continued Medical Work up  Expected Discharge Plan and Services     Post Acute Care Choice:  (TBD) Living arrangements for the past 2 months: Single Family Home                                       Social Determinants of Health (SDOH) Interventions SDOH Screenings   Food Insecurity: No Food Insecurity (07/02/2023)  Housing: Low Risk  (07/02/2023)  Transportation Needs: No Transportation Needs (07/02/2023)  Utilities: Not At Risk (07/02/2023)  Alcohol Screen: Low Risk  (05/19/2023)  Depression (PHQ2-9): Low Risk  (01/20/2023)  Financial Resource Strain: Low Risk  (05/19/2023)  Physical Activity: Sufficiently Active (05/19/2023)  Social Connections: Socially Integrated (07/02/2023)  Stress: No Stress Concern Present (05/19/2023)  Tobacco Use: Medium Risk (07/02/2023)    Readmission Risk Interventions     No data to display

## 2023-07-07 NOTE — TOC Progression Note (Signed)
Transition of Care (TOC) - Progression Note    Patient Details  Name: Johnathan Arnold. MRN: [id number] Date of Birth: 08/09/1945  [facility]) CM/SW Contact  Margarito Liner, LCSW Phone Number: 07/07/2023, 8:23 AM  Clinical Narrative:  PASARR obtained: [id number].   Expected Discharge Plan: Skilled Nursing Facility Barriers to Discharge: Continued Medical Work up  Expected Discharge Plan and Services     Post Acute Care Choice:  (TBD) Living arrangements for the past 2 months: Single Family Home                                       Social Determinants of Health (SDOH) Interventions SDOH Screenings   Food Insecurity: No Food Insecurity (07/02/2023)  Housing: Low Risk  (07/02/2023)  Transportation Needs: No Transportation Needs (07/02/2023)  Utilities: Not At Risk (07/02/2023)  Alcohol Screen: Low Risk  (05/19/2023)  Depression (PHQ2-9): Low Risk  (01/20/2023)  Financial Resource Strain: Low Risk  (05/19/2023)  Physical Activity: Sufficiently Active (05/19/2023)  Social Connections: Socially Integrated (07/02/2023)  Stress: No Stress Concern Present (05/19/2023)  Tobacco Use: Medium Risk (07/02/2023)    Readmission Risk Interventions     No data to display

## 2023-07-07 NOTE — Plan of Care (Signed)

## 2023-07-07 NOTE — Progress Notes (Signed)
Physical Therapy Treatment Patient Details Name: Johnathan Arnold. MRN: [id number] DOB: 1945/09/09 Today's Date: 07/07/2023   History of Present Illness Pt is a 78 y.o. male presented with worsening of generalized weakness. MD assessment includes: acute on chronic ambulation impairment, AKI on CKD IIIa, and UTI. PMHx: CAD/CABG, HTN, CKD stage IIIa, Parkinson's disease-BLE tremors.    PT Comments  Pt was pleasant and motivated to participate during the session and put forth good effort throughout. Pt required min A with all functional tasks including for stability in both sitting and standing.  Pt presented with mild left lateral lean in both sitting and standing that both improved after R lateral weight shifting activities.  Pt was able to amb 8' x 1 and 5' x 1 with min A for stability and cues for sequencing but further amb limited by onset of nausea with nursing notified.  Pt will benefit from continued PT services upon discharge to safely address deficits listed in patient problem list for decreased caregiver assistance and eventual return to PLOF.      If plan is discharge home, recommend the following: Assistance with cooking/housework;Assist for transportation;Help with stairs or ramp for entrance;A lot of help with walking and/or transfers;A lot of help with bathing/dressing/bathroom   Can travel by private vehicle     No  Equipment Recommendations  None recommended by PT    Recommendations for Other Services       Precautions / Restrictions Precautions Precautions: Fall Restrictions Weight Bearing Restrictions Per Provider Order: No     Mobility  Bed Mobility Overal bed mobility: Needs Assistance Bed Mobility: Supine to Sit     Supine to sit: Min assist     General bed mobility comments: Min A for BLE and trunk control to come to sitting and for stability upon initial stand    Transfers Overall transfer level: Needs assistance Equipment used: Rolling walker (2  wheels) Transfers: Sit to/from Stand Sit to Stand: From elevated surface, Min assist           General transfer comment: Multi-modal cues for sequencing with min A to come to full upright standing    Ambulation/Gait Ambulation/Gait assistance: Min assist Gait Distance (Feet): 8 Feet x 1, 5 Feet x 1 Assistive device: Rolling walker (2 wheels) Gait Pattern/deviations: Step-through pattern, Decreased step length - right, Decreased step length - left, Shuffle, Trunk flexed Gait velocity: decreased     General Gait Details: Heavy cues for sequencing with very short, shuffling steps and min A for stability   Stairs             Wheelchair Mobility     Tilt Bed    Modified Rankin (Stroke Patients Only)       Balance Overall balance assessment: Needs assistance Sitting-balance support: Feet supported Sitting balance-Leahy Scale: Poor Sitting balance - Comments: Left lateral lean initially requiring min A to correct but improved with R lateral weight shifting activities Postural control: Left lateral lean Standing balance support: Bilateral upper extremity supported, During functional activity, Reliant on assistive device for balance Standing balance-Leahy Scale: Poor Standing balance comment: Min A for stability in standing                            Cognition Arousal: Alert Behavior During Therapy: WFL for tasks assessed/performed Overall Cognitive Status: Within Functional Limits for tasks assessed  Exercises Other Exercises Other Exercises: R lateral weight shifting in sitting and standing to address L lateral lean    General Comments        Pertinent Vitals/Pain Pain Assessment Pain Assessment: No/denies pain    Home Living                          Prior Function            PT Goals (current goals can now be found in the care plan section) Progress towards PT goals:  Not progressing toward goals - comment    Frequency    Min 1X/week      PT Plan      Co-evaluation              AM-PAC PT "6 Clicks" Mobility   Outcome Measure  Help needed turning from your back to your side while in a flat bed without using bedrails?: A Little Help needed moving from lying on your back to sitting on the side of a flat bed without using bedrails?: A Little Help needed moving to and from a bed to a chair (including a wheelchair)?: A Little Help needed standing up from a chair using your arms (e.g., wheelchair or bedside chair)?: A Little Help needed to walk in hospital room?: A Lot Help needed climbing 3-5 steps with a railing? : Total 6 Click Score: 15    End of Session Equipment Utilized During Treatment: Gait belt Activity Tolerance: Other (comment) (limited by nausea) Patient left: in chair;with call bell/phone within reach;with chair alarm set;with family/visitor present Nurse Communication: Mobility status;Other (comment) (Nursing notified of nausea, MD notified of left lateral instability) PT Visit Diagnosis: Unsteadiness on feet (R26.81);History of falling (Z91.81);Difficulty in walking, not elsewhere classified (R26.2);Muscle weakness (generalized) (M62.81)     Time: 1449-1510 PT Time Calculation (min) (ACUTE ONLY): 21 min  Charges:    $Therapeutic Activity: 8-22 mins PT General Charges $$ ACUTE PT VISIT: 1 Visit                     D. Elly Modena PT, DPT 07/07/23, 4:22 PM

## 2023-07-08 ENCOUNTER — Telehealth: Payer: Self-pay | Admitting: Neurology

## 2023-07-08 DIAGNOSIS — R531 Weakness: Secondary | ICD-10-CM | POA: Diagnosis not present

## 2023-07-08 DIAGNOSIS — R7881 Bacteremia: Secondary | ICD-10-CM | POA: Diagnosis not present

## 2023-07-08 DIAGNOSIS — N3000 Acute cystitis without hematuria: Secondary | ICD-10-CM | POA: Diagnosis not present

## 2023-07-08 DIAGNOSIS — R262 Difficulty in walking, not elsewhere classified: Secondary | ICD-10-CM | POA: Diagnosis not present

## 2023-07-08 LAB — GLUCOSE, CAPILLARY
Glucose-Capillary: 95 mg/dL (ref 70–99)
Glucose-Capillary: 100 mg/dL — ABNORMAL HIGH (ref 70–99)
Glucose-Capillary: 127 mg/dL — ABNORMAL HIGH (ref 70–99)
Glucose-Capillary: 111 mg/dL — ABNORMAL HIGH (ref 70–99)

## 2023-07-08 MED ORDER — ONDANSETRON HCL 4 MG/2ML IJ SOLN: 4.0000 mg | Freq: Three times a day (TID) | INTRAMUSCULAR | Status: DC | PRN

## 2023-07-08 MED ORDER — DOCUSATE SODIUM 100 MG PO CAPS
100.0000 mg | ORAL_CAPSULE | Freq: Two times a day (BID) | ORAL | Status: DC
  Administered 2023-07-08 – 2023-07-13 (×7): 100 mg via ORAL
  Filled 2023-07-08 (×10): qty 1

## 2023-07-08 MED ORDER — SENNA 8.6 MG PO TABS
2.0000 | ORAL_TABLET | Freq: Every day | ORAL | Status: DC
  Administered 2023-07-11: 17.2 mg via ORAL
  Filled 2023-07-08 (×2): qty 2

## 2023-07-08 NOTE — Plan of Care (Signed)

## 2023-07-08 NOTE — Telephone Encounter (Signed)
Pt called in stating he is in the hospital due to not being able to walk. He wants Dr. Arbutus Leas to be aware since he had this same thing happen 2 years ago when he took Paxlovid. They think he needs outpatient therapy. He is afraid the change in medication might be contributing to this problem.

## 2023-07-08 NOTE — Plan of Care (Signed)
  Problem: Clinical Measurements: Goal: Ability to maintain clinical measurements within normal limits will improve 07/08/2023 2008 by Risa Grill, RN Outcome: Progressing 07/08/2023 2007 by Risa Grill, RN Outcome: Progressing 07/08/2023 2007 by Risa Grill, RN Outcome: Progressing Goal: Will remain free from infection 07/08/2023 2008 by Risa Grill, RN Outcome: Progressing 07/08/2023 2007 by Risa Grill, RN Outcome: Progressing 07/08/2023 2007 by Risa Grill, RN Outcome: Progressing Goal: Diagnostic test results will improve 07/08/2023 2008 by Risa Grill, RN Outcome: Progressing 07/08/2023 2007 by Risa Grill, RN Outcome: Progressing 07/08/2023 2007 by Risa Grill, RN Outcome: Progressing Goal: Respiratory complications will improve 07/08/2023 2008 by Risa Grill, RN Outcome: Progressing 07/08/2023 2007 by Risa Grill, RN Outcome: Progressing 07/08/2023 2007 by Risa Grill, RN Outcome: Progressing Goal: Cardiovascular complication will be avoided 07/08/2023 2008 by Risa Grill, RN Outcome: Progressing 07/08/2023 2007 by Risa Grill, RN Outcome: Progressing 07/08/2023 2007 by Risa Grill, RN Outcome: Progressing   Problem: Clinical Measurements: Goal: Diagnostic test results will improve 07/08/2023 2008 by Risa Grill, RN Outcome: Progressing 07/08/2023 2007 by Risa Grill, RN Outcome: Progressing 07/08/2023 2007 by Risa Grill, RN Outcome: Progressing   Problem: Clinical Measurements: Goal: Respiratory complications will improve 07/08/2023 2008 by Risa Grill, RN Outcome: Progressing 07/08/2023 2007 by Risa Grill, RN Outcome: Progressing 07/08/2023 2007 by Risa Grill, RN Outcome:  Progressing   Problem: Activity: Goal: Risk for activity intolerance will decrease 07/08/2023 2008 by Risa Grill, RN Outcome: Progressing 07/08/2023 2007 by Risa Grill, RN Outcome: Progressing 07/08/2023 2007 by Risa Grill, RN Outcome: Progressing

## 2023-07-08 NOTE — Progress Notes (Signed)
Mobility Specialist - Progress Note   07/08/23 1558  Therapy Vitals  Pulse Rate 62  BP (!) 113/53  Oxygen Therapy  SpO2 100 %  Mobility  Activity Ambulated with assistance in room  Level of Assistance Minimal assist, patient does 75% or more  Assistive Device Front wheel walker  Distance Ambulated (ft) 20 ft  Activity Response Tolerated well  Mobility visit 1 Mobility     Pt lying in bed upon arrival, utilizing RA. Pt agreeable to activity. Denied pain and nausea initially. Pt completed bed mobility with minA. STS with CGA. Pt ambulated to door when author noticed increased response time from pt. Going from minA to [place] for balance. Pt became nauseous. Author called out for chair and lowered pt to sitting position. VS checked, recorded above, and stable. Pt returned to bed with alarm set, needs in reach. RN notified.   Filiberto Pinks Mobility Specialist 07/08/23, 4:25 PM

## 2023-07-08 NOTE — Telephone Encounter (Signed)
Tried to call no answer

## 2023-07-08 NOTE — Plan of Care (Signed)

## 2023-07-08 NOTE — TOC Progression Note (Signed)
Transition of Care (TOC) - Progression Note    Patient Details  Name: Johnathan Arnold. MRN: [id number] Date of Birth: 02/08/1946  Transition of Care [facility]) CM/SW Contact  Margarito Liner, LCSW Phone Number: 07/08/2023, 12:08 PM  Clinical Narrative:  CSW provided bed offers to patient and wife. They will review and make decision.   Expected Discharge Plan: Skilled Nursing Facility Barriers to Discharge: Continued Medical Work up  Expected Discharge Plan and Services     Post Acute Care Choice:  (TBD) Living arrangements for the past 2 months: Single Family Home                                       Social Determinants of Health (SDOH) Interventions SDOH Screenings   Food Insecurity: No Food Insecurity (07/02/2023)  Housing: Low Risk  (07/02/2023)  Transportation Needs: No Transportation Needs (07/02/2023)  Utilities: Not At Risk (07/02/2023)  Alcohol Screen: Low Risk  (05/19/2023)  Depression (PHQ2-9): Low Risk  (01/20/2023)  Financial Resource Strain: Low Risk  (05/19/2023)  Physical Activity: Sufficiently Active (05/19/2023)  Social Connections: Socially Integrated (07/02/2023)  Stress: No Stress Concern Present (05/19/2023)  Tobacco Use: Medium Risk (07/02/2023)    Readmission Risk Interventions     No data to display

## 2023-07-08 NOTE — Progress Notes (Signed)
Progress Note   Patient: Johnathan Arnold. ZOX:[id number] DOB: July 28, 1945 DOA: 07/02/2023     6 DOS: the patient was seen and examined on 07/08/2023   Brief hospital course: Jahlani Lorentz. is a 78 y.o. male with medical history significant of CAD/CABG, HTN, CKD stage IIIa, Parkinson's disease, presented with worsening of generalized weakness.  Patient is admitted to the hospitalist service for worsening generalized weakness in the setting of UTI, Parkinson disease.  Assessment and Plan: Generalized weakness- Ambulatory impairment- Weak, able to work with PT. Multifactorial in the setting of Parkinson's disease, UTI. He thinks Entacapone causing symptoms, and wishes to stop it. Sent secure chat to Dr. Arbutus Leas about stopping Entacapone. Continue Sinemet therapy. TOC working on SNF placement.  Positive blood cultures with staph species. likely contamination. Repeat cultures so far negative. Respiratory panel negative.  UTI- Finished Rocephin therapy. No urine cultures obtained.  Acute on CKD stage IIIa. Kidney function baseline now, encourage oral fluids. Avoid nephrotoxic drugs. Monitor daily renal function. Metabolic acidosis improved  CAD with history of CABG- No active chest pain. Continue aspirin, statin.  Hypertension: Blood pressure stable. Hold losartan due to AKI. Hold Lasix as he is eating poor.   Continue Cipro eyedrops for 2 more days. Continue zofran for nausea. Constipation regimen ordered. Out of bed to chair. Incentive spirometry. Nursing supportive care. Fall, aspiration precautions. DVT prophylaxis   Code Status: Full Code  Subjective: Patient is seen and examined today morning.  Patient is weak, lying in bed. He complained of nausea since last night.  Encouraged him to eat, work with PT. Wife at bedside.  Physical Exam: Vitals:   07/07/23 1633 07/07/23 1859 07/08/23 0439 07/08/23 0840  BP: (!) 167/61 (!) 166/58 (!) 193/62 (!) 152/98  Pulse:  (!) 55 62 66 64  Resp: 18 16 16 16   Temp: 98.3 F (36.8 C) 98.4 F (36.9 C) 98.4 F (36.9 C) 97.7 F (36.5 C)  TempSrc: Oral Oral Oral Oral  SpO2: 98% 99% 97% 99%  Weight:      Height:        General - Elderly weak Caucasian male, sitting, no apparent distress HEENT - PERRLA, EOMI, atraumatic head, non tender sinuses. Lung - Clear, diffuse rales, rhonchi, wheezes. Heart - S1, S2 heard, no murmurs, rubs, trace pedal edema. Abdomen - Soft, non tender, bowel sounds good Neuro - alert, awake and oriented, weak, non focal exam Skin - Warm and dry.  Data Reviewed:      Latest Ref Rng & Units 07/05/2023    6:11 AM 07/03/2023    6:18 AM 07/02/2023   10:45 AM  CBC  WBC 4.0 - 10.5 K/uL 3.8  5.0  5.4   Hemoglobin 13.0 - 17.0 g/dL 81.1  91.4  78.2   Hematocrit 39.0 - 52.0 % 33.1  30.9  36.6   Platelets 150 - 400 K/uL 123  129  152       Latest Ref Rng & Units 07/05/2023    6:11 AM 07/03/2023    6:18 AM 07/02/2023   10:45 AM  BMP  Glucose 70 - 99 mg/dL 956  62  72   BUN 8 - 23 mg/dL 14  26  36   Creatinine 0.61 - 1.24 mg/dL 2.13  0.86  5.78   Sodium 135 - 145 mmol/L 137  139  137   Potassium 3.5 - 5.1 mmol/L 4.2  4.2  4.5   Chloride 98 - 111 mmol/L 103  106  100   CO2 22 - 32 mmol/L 24  18  19    Calcium 8.9 - 10.3 mg/dL 9.2  8.9  9.1    No results found.  Family Communication: Discussed with patient, wife at bedside, they understand and agree. All questions answereed.  Disposition: Status is: Inpatient Remains inpatient appropriate because: weakness, safe discharge plan  Planned Discharge Destination: Skilled nursing facility     Time spent: 38 minutes  Author: Marcelino Duster, MD 07/08/2023 1:39 PM Secure chat 7am to 7pm For on call review [email or web address].

## 2023-07-08 NOTE — Plan of Care (Signed)
  Problem: Clinical Measurements: Goal: Diagnostic test results will improve 07/08/2023 2007 by Risa Grill, RN Outcome: Progressing 07/08/2023 2007 by Risa Grill, RN Outcome: Progressing   Problem: Clinical Measurements: Goal: Respiratory complications will improve 07/08/2023 2007 by Risa Grill, RN Outcome: Progressing 07/08/2023 2007 by Risa Grill, RN Outcome: Progressing   Problem: Clinical Measurements: Goal: Cardiovascular complication will be avoided 07/08/2023 2007 by Risa Grill, RN Outcome: Progressing 07/08/2023 2007 by Risa Grill, RN Outcome: Progressing   Problem: Activity: Goal: Risk for activity intolerance will decrease 07/08/2023 2007 by Risa Grill, RN Outcome: Progressing 07/08/2023 2007 by Risa Grill, RN Outcome: Progressing   Problem: Nutrition: Goal: Adequate nutrition will be maintained 07/08/2023 2007 by Risa Grill, RN Outcome: Progressing 07/08/2023 2007 by Risa Grill, RN Outcome: Progressing   Problem: Elimination: Goal: Will not experience complications related to bowel motility 07/08/2023 2007 by Risa Grill, RN Outcome: Progressing 07/08/2023 2007 by Risa Grill, RN Outcome: Progressing Goal: Will not experience complications related to urinary retention 07/08/2023 2007 by Risa Grill, RN Outcome: Progressing 07/08/2023 2007 by Risa Grill, RN Outcome: Progressing   Problem: Coping: Goal: Level of anxiety will decrease 07/08/2023 2007 by Risa Grill, RN Outcome: Progressing 07/08/2023 2007 by Risa Grill, RN Outcome: Progressing   Problem: Elimination: Goal: Will not experience complications related to urinary retention 07/08/2023 2007 by Risa Grill, RN Outcome: Progressing 07/08/2023 2007 by Risa Grill, RN Outcome: Progressing   Problem: Safety: Goal: Ability to remain free from injury will improve 07/08/2023 2007 by Risa Grill, RN Outcome: Progressing 07/08/2023 2007 by Risa Grill, RN Outcome: Progressing   Problem: Skin Integrity: Goal: Risk for impaired skin integrity will decrease 07/08/2023 2007 by Risa Grill, RN Outcome: Progressing 07/08/2023 2007 by Risa Grill, RN Outcome: Progressing

## 2023-07-09 DIAGNOSIS — N3000 Acute cystitis without hematuria: Secondary | ICD-10-CM | POA: Diagnosis not present

## 2023-07-09 DIAGNOSIS — R262 Difficulty in walking, not elsewhere classified: Secondary | ICD-10-CM | POA: Diagnosis not present

## 2023-07-09 DIAGNOSIS — R531 Weakness: Secondary | ICD-10-CM | POA: Diagnosis not present

## 2023-07-09 DIAGNOSIS — R7881 Bacteremia: Secondary | ICD-10-CM | POA: Diagnosis not present

## 2023-07-09 LAB — URINALYSIS, ROUTINE W REFLEX MICROSCOPIC
Bacteria, UA: NONE SEEN
Bilirubin Urine: NEGATIVE
Glucose, UA: NEGATIVE mg/dL
Hgb urine dipstick: NEGATIVE
Ketones, ur: NEGATIVE mg/dL
Leukocytes,Ua: NEGATIVE
Nitrite: NEGATIVE
Protein, ur: 30 mg/dL — AB
Specific Gravity, Urine: 1.008 (ref 1.005–1.030)
Squamous Epithelial / HPF: 0 /[HPF] (ref 0–5)
pH: 6 (ref 5.0–8.0)

## 2023-07-09 LAB — BASIC METABOLIC PANEL
Anion gap: 12 (ref 5–15)
BUN: 13 mg/dL (ref 8–23)
CO2: 27 mmol/L (ref 22–32)
Calcium: 9.3 mg/dL (ref 8.9–10.3)
Chloride: 98 mmol/L (ref 98–111)
Creatinine, Ser: 1.56 mg/dL — ABNORMAL HIGH (ref 0.61–1.24)
GFR, Estimated: 45 mL/min — ABNORMAL LOW (ref >60)
Glucose, Bld: 106 mg/dL — ABNORMAL HIGH (ref 70–99)
Potassium: 3.3 mmol/L — ABNORMAL LOW (ref 3.5–5.1)
Sodium: 137 mmol/L (ref 135–145)

## 2023-07-09 LAB — GLUCOSE, CAPILLARY
Glucose-Capillary: 106 mg/dL — ABNORMAL HIGH (ref 70–99)
Glucose-Capillary: 115 mg/dL — ABNORMAL HIGH (ref 70–99)
Glucose-Capillary: 108 mg/dL — ABNORMAL HIGH (ref 70–99)
Glucose-Capillary: 127 mg/dL — ABNORMAL HIGH (ref 70–99)

## 2023-07-09 LAB — CBC
HCT: 33.3 % — ABNORMAL LOW (ref 39.0–52.0)
Hemoglobin: 11.9 g/dL — ABNORMAL LOW (ref 13.0–17.0)
MCH: 35 pg — ABNORMAL HIGH (ref 26.0–34.0)
MCHC: 35.7 g/dL (ref 30.0–36.0)
MCV: 97.9 fL (ref 80.0–100.0)
Platelets: 143 10*3/uL — ABNORMAL LOW (ref 150–400)
RBC: 3.4 MIL/uL — ABNORMAL LOW (ref 4.22–5.81)
RDW: 12.5 % (ref 11.5–15.5)
WBC: 3.7 10*3/uL — ABNORMAL LOW (ref 4.0–10.5)
nRBC: 0 % (ref 0.0–0.2)

## 2023-07-09 MED ORDER — POTASSIUM CHLORIDE 20 MEQ PO PACK
40.0000 meq | PACK | Freq: Once | ORAL | Status: AC
  Administered 2023-07-09: 40 meq via ORAL
  Filled 2023-07-09: qty 2

## 2023-07-09 MED ORDER — LACTATED RINGERS IV SOLN: INTRAVENOUS | Status: AC

## 2023-07-09 NOTE — Telephone Encounter (Signed)
Called and spoke to patient they stopped Entacapone two days ago. Patient going to therapy and possible SNF. He thanked me for calling

## 2023-07-09 NOTE — Progress Notes (Signed)
Occupational Therapy Treatment Patient Details Name: Johnathan Arnold. MRN: [id number] DOB: 04-Nov-1945 Today's Date: 07/09/2023   History of present illness Pt is a 78 y.o. male presented with worsening of generalized weakness. MD assessment includes: acute on chronic ambulation impairment, AKI on CKD IIIa, and UTI. PMHx: CAD/CABG, HTN, CKD stage IIIa, Parkinson's disease-BLE tremors.   OT comments  Pt is supine in bed on arrival. Wife present, pt pleasant and agreeable to OT session-motivated to get better. He denies pain. Pt performed bed mobility with CGA, no physical assist, but HOB elevated and use of bed rails with increased time. Forward scoot with SUP. STS from EOB to RW with Min A, cueing for pushing up from bed versus pulling on RW and for anterior weight shift. Posterior bias with standing attempts. Pt ambulated ~4-5 feet before needing seated RB. OT took orthostatic vitals d/t continued symptoms of weakness/nausea with prolonged standing/walking. Nurse and MD notified of below findings.  Orthostatic VS:   BP- Sitting BP- Standing at 0 minutes BP- Standing at 3 minutes  07/09/23 1600 129/60 -- --  07/09/23 1558 138/61 113/45 (!) 84/46   Once BP returned stable seated, pt ambulated back to EOB ~4-5 feet and returned to supine. Edu on importance of bed level exercises to maintain strength/prevent further decline. Wife may bring his red theraband from home. Pt left with all needs in place and will cont to require skilled acute OT services to maximize his safety and IND to return to PLOF.       If plan is discharge home, recommend the following:  Assist for transportation;Assistance with cooking/housework;A little help with walking and/or transfers;A lot of help with bathing/dressing/bathroom;Help with stairs or ramp for entrance   Equipment Recommendations  Tub/shower seat    Recommendations for Other Services      Precautions / Restrictions Precautions Precautions:  Fall Precaution Comments: orthostatic positive this date Restrictions Weight Bearing Restrictions Per Provider Order: No       Mobility Bed Mobility Overal bed mobility: Needs Assistance Bed Mobility: Supine to Sit, Sit to Supine     Supine to sit: Supervision, HOB elevated, Used rails, Contact guard Sit to supine: Supervision   General bed mobility comments: CGA for bed mobility this date with HOB elevated and use of rails    Transfers Overall transfer level: Needs assistance Equipment used: Rolling walker (2 wheels) Transfers: Sit to/from Stand Sit to Stand: Min assist           General transfer comment: Min A for STS from EOB with bed at lowest height; cueing for anterior weight shift and pushing from bed/chair vs pulling on RW; only ambulated ~4-5 ft x2  d/t orthostatic +     Balance Overall balance assessment: Needs assistance Sitting-balance support: Feet supported Sitting balance-Leahy Scale: Good Sitting balance - Comments: steady seated at EOB   Standing balance support: Bilateral upper extremity supported, During functional activity, Reliant on assistive device for balance Standing balance-Leahy Scale: Fair Standing balance comment: CGA and RW use for mobility                           ADL either performed or assessed with clinical judgement   ADL Overall ADL's : Needs assistance/impaired  General ADL Comments: none performed this date as pt declined however likely to need more assist d/t weakness and orthostatic vitals; Max/Mod A for LB and SUP/set up for UB    Extremity/Trunk Assessment              Vision       Perception     Praxis      Cognition Arousal: Alert Behavior During Therapy: WFL for tasks assessed/performed Overall Cognitive Status: Within Functional Limits for tasks assessed                                          Exercises Other  Exercises Other Exercises: Edu on BUE/BLE HEP at bed level to prevent further decline. Wife may bring his red theraband from home to utilize.    Shoulder Instructions       General Comments orthostatic positive vitals with nurse and MD notified via secure chat    Pertinent Vitals/ Pain       Pain Assessment Pain Assessment: No/denies pain  Home Living                                          Prior Functioning/Environment              Frequency  Min 1X/week        Progress Toward Goals  OT Goals(current goals can now be found in the care plan section)  Progress towards OT goals: Progressing toward goals  Acute Rehab OT Goals Patient Stated Goal: improve strength OT Goal Formulation: With patient/family Time For Goal Achievement: 07/17/23 Potential to Achieve Goals: Good  Plan      Co-evaluation                 AM-PAC OT "6 Clicks" Daily Activity     Outcome Measure   Help from another person eating meals?: None Help from another person taking care of personal grooming?: None Help from another person toileting, which includes using toliet, bedpan, or urinal?: A Lot Help from another person bathing (including washing, rinsing, drying)?: A Lot Help from another person to put on and taking off regular upper body clothing?: A Little Help from another person to put on and taking off regular lower body clothing?: A Lot 6 Click Score: 17    End of Session Equipment Utilized During Treatment: Rolling walker (2 wheels);Gait belt  OT Visit Diagnosis: Other abnormalities of gait and mobility (R26.89);Repeated falls (R29.6);Muscle weakness (generalized) (M62.81);Unsteadiness on feet (R26.81)   Activity Tolerance Patient tolerated treatment well;Treatment limited secondary to medical complications (Comment) (orthostatic)   Patient Left in bed;with call bell/phone within reach;with bed alarm set;with family/visitor present   Nurse  Communication Mobility status        Time: 1520-1549 OT Time Calculation (min): 29 min  Charges: OT General Charges $OT Visit: 1 Visit OT Treatments $Therapeutic Activity: 23-37 mins  Karilyn Wind, OTR/L  07/09/23, 4:06 PM   Jalysa Swopes E Wiley Flicker 07/09/2023, 4:02 PM

## 2023-07-09 NOTE — Plan of Care (Signed)
Problem: Activity: Goal: Risk for activity intolerance will decrease Outcome: Progressing   Problem: Nutrition: Goal: Adequate nutrition will be maintained Outcome: Progressing   Problem: Safety: Goal: Ability to remain free from injury will improve Outcome: Progressing   Problem: Skin Integrity: Goal: Risk for impaired skin integrity will decrease Outcome: Progressing

## 2023-07-09 NOTE — Progress Notes (Signed)
Progress Note   Patient: Johnathan Arnold. ZOX:[id number] DOB: 1946-04-24 DOA: 07/02/2023     7 DOS: the patient was seen and examined on 07/09/2023   Brief hospital course: Johnathan Arnold. is a 78 y.o. male with medical history significant of CAD/CABG, HTN, CKD stage IIIa, Parkinson's disease, presented with worsening of generalized weakness.  Patient is admitted to the hospitalist service for worsening generalized weakness in the setting of UTI, Parkinson disease.  Assessment and Plan: Generalized weakness- Ambulatory impairment- Weak, able to work with PT. Multifactorial in the setting of Parkinson's disease, UTI. He thinks Entacapone causing symptoms, and wishes to stop it. Sent secure chat to Dr. Arbutus Leas about stopping Entacapone 07/07/23. Continue Sinemet therapy. TOC working on SNF placement.  Positive blood cultures with staph species. likely contamination. Repeat cultures so far negative. Respiratory panel negative.  UTI- Finished Rocephin therapy. No urine cultures obtained.  Orthostatic hypotension- Noted while working with OT. Will give gentle IV fluids as he is eating poor Parkinsons can have autonomic dysfunction. Continue to hold antihypertensives. Fall precautions. Continue PT/ OT.  Acute on CKD stage IIIa. Kidney function baseline now, encourage oral fluids. Avoid nephrotoxic drugs. Monitor daily renal function. Metabolic acidosis improved  CAD with history of CABG- No active chest pain. Continue aspirin, statin.  Hypertension: Blood pressure stable. Hold losartan due to AKI. Hold Lasix as he is eating poor.   Continue Cipro eyedrops for 2 more days. Continue zofran for nausea. Constipation regimen ordered. Out of bed to chair. Incentive spirometry. Nursing supportive care. Fall, aspiration precautions. DVT prophylaxis   Code Status: Full Code  Subjective: Patient is seen and examined today morning.  Patient is weak, lying in bed.  Nausea  better.  He had good bowel movement since last 2 days.  Appetite poor as per wife at bedside.  Physical Exam: Vitals:   07/08/23 1558 07/08/23 2007 07/09/23 0441 07/09/23 0806  BP: (!) 113/53 (!) 156/63 (!) 167/67 (!) 186/63  Pulse: 62 65 60 (!) 55  Resp:  16 16 14   Temp:  98.4 F (36.9 C) 98.1 F (36.7 C) 98.3 F (36.8 C)  TempSrc:  Oral Oral Oral  SpO2: 100% 98% 94% 100%  Weight:      Height:        General - Elderly weak Caucasian male, lying, no apparent distress HEENT - PERRLA, EOMI, atraumatic head, non tender sinuses. Lung - Clear, diffuse rales, rhonchi, wheezes. Heart - S1, S2 heard, no murmurs, rubs, trace pedal edema. Abdomen - Soft, non tender, bowel sounds good Neuro - alert, awake and oriented, weak, non focal exam Skin - Warm and dry.  Data Reviewed:      Latest Ref Rng & Units 07/09/2023    6:08 AM 07/05/2023    6:11 AM 07/03/2023    6:18 AM  CBC  WBC 4.0 - 10.5 K/uL 3.7  3.8  5.0   Hemoglobin 13.0 - 17.0 g/dL 81.1  91.4  78.2   Hematocrit 39.0 - 52.0 % 33.3  33.1  30.9   Platelets 150 - 400 K/uL 143  123  129       Latest Ref Rng & Units 07/09/2023    6:08 AM 07/05/2023    6:11 AM 07/03/2023    6:18 AM  BMP  Glucose 70 - 99 mg/dL 956  213  62   BUN 8 - 23 mg/dL 13  14  26    Creatinine 0.61 - 1.24 mg/dL 0.86  5.78  4.69  Sodium 135 - 145 mmol/L 137  137  139   Potassium 3.5 - 5.1 mmol/L 3.3  4.2  4.2   Chloride 98 - 111 mmol/L 98  103  106   CO2 22 - 32 mmol/L 27  24  18    Calcium 8.9 - 10.3 mg/dL 9.3  9.2  8.9    No results found.  Family Communication: Discussed with patient, wife at bedside, they understand and agree. All questions answereed.  Disposition: Status is: Inpatient Remains inpatient appropriate because: weakness, safe discharge plan  Planned Discharge Destination: Skilled nursing facility     Time spent: 37 minutes  Author: Marcelino Duster, MD 07/09/2023 3:56 PM Secure chat 7am to 7pm For on call review  [email or web address].

## 2023-07-10 DIAGNOSIS — N3000 Acute cystitis without hematuria: Secondary | ICD-10-CM | POA: Diagnosis not present

## 2023-07-10 DIAGNOSIS — R262 Difficulty in walking, not elsewhere classified: Secondary | ICD-10-CM | POA: Diagnosis not present

## 2023-07-10 DIAGNOSIS — R7881 Bacteremia: Secondary | ICD-10-CM | POA: Diagnosis not present

## 2023-07-10 DIAGNOSIS — R531 Weakness: Secondary | ICD-10-CM | POA: Diagnosis not present

## 2023-07-10 LAB — CULTURE, BLOOD (ROUTINE X 2)
Culture: NO GROWTH
Culture: NO GROWTH
Special Requests: ADEQUATE

## 2023-07-10 LAB — GLUCOSE, CAPILLARY
Glucose-Capillary: 111 mg/dL — ABNORMAL HIGH (ref 70–99)
Glucose-Capillary: 135 mg/dL — ABNORMAL HIGH (ref 70–99)
Glucose-Capillary: 130 mg/dL — ABNORMAL HIGH (ref 70–99)
Glucose-Capillary: 136 mg/dL — ABNORMAL HIGH (ref 70–99)

## 2023-07-10 NOTE — TOC Progression Note (Addendum)
Transition of Care (TOC) - Progression Note    Patient Details  Name: Johnathan Arnold. MRN: [id number] Date of Birth: [birth year]  Transition [facility]) CM/SW Contact  Lorri Frederick, LCSW Phone Number: 07/10/2023, 12:05 PM  Clinical Narrative:   CSW met with pt and wife regarding SNF choice.  Medicare choice document provided.  [facility] has declined but they have spoken to someone at [facility] and think they may have a bed.  If not [facility], they will accept offer at [facility].  1000: message from [facility].  Pt is on wait list for their independent living, but she still does not have available bed today.  Message sent to [facility] to see if he can receive pt today.   1230: TC [facility].  Will not have bed for this pt until Monday.    Expected Discharge Plan: Skilled Nursing Facility Barriers to Discharge: Continued Medical Work up  Expected Discharge Plan and Services     Post Acute Care Choice:  (TBD) Living arrangements for the past 2 months: Single Family Home                                       Social Determinants of Health (SDOH) Interventions SDOH Screenings   Food Insecurity: No Food Insecurity (07/02/2023)  Housing: Low Risk  (07/02/2023)  Transportation Needs: No Transportation Needs (07/02/2023)  Utilities: Not At Risk (07/02/2023)  Alcohol Screen: Low Risk  (05/19/2023)  Depression (PHQ2-9): Low Risk  (01/20/2023)  Financial Resource Strain: Low Risk  (05/19/2023)  Physical Activity: Sufficiently Active (05/19/2023)  Social Connections: Socially Integrated (07/02/2023)  Stress: No Stress Concern Present (05/19/2023)  Tobacco Use: Medium Risk (07/02/2023)    Readmission Risk Interventions     No data to display

## 2023-07-10 NOTE — Progress Notes (Signed)
Progress Note   Patient: Johnathan Arnold. ZOX:[id number] DOB: 06-Jun-1946 DOA: 07/02/2023     8 DOS: the patient was seen and examined on 07/10/2023   Brief hospital course: Youcef Klas. is a 78 y.o. male with medical history significant of CAD/CABG, HTN, CKD stage IIIa, Parkinson's disease, presented with worsening of generalized weakness.  Patient is admitted to the hospitalist service for worsening generalized weakness in the setting of UTI, Parkinson disease.  During hospital stay, he is noted to be weak, not eating well. Finished antibiotics of UTI, entacapone stopped (notified his neurologist). He is evaluated by PT advised SNF. He had nausea 07/07/24 which did improve. Encourage oral diet, out of bed, currently awaiting SNF placement.  Assessment and Plan: Generalized weakness- Ambulatory impairment- Weak, able to work with PT. Multifactorial in the setting of Parkinson's disease, UTI. He thinks Entacapone causing symptoms, and wished to stop it. Sent secure chat to Dr. Arbutus Leas about dc Entacapone 07/07/23. Continue Sinemet therapy. TOC working on SNF placement.  Positive blood cultures with staph species. likely contamination. Repeat cultures so far negative. Respiratory panel negative.  UTI- Finished Rocephin therapy. No urine cultures obtained. Repeat UA obtained due to dysuria is unremarkable. Encouraged oral fluids.  Orthostatic hypotension- In the setting of dehydration and parkinsons autonomic dysfunction. Will give gentle IV fluids as he is eating poor Continue to hold antihypertensives. Fall precautions. Continue PT/ OT.  Acute on CKD stage IIIa. Kidney function baseline now, encourage oral fluids. Avoid nephrotoxic drugs. Monitor daily renal function. Metabolic acidosis improved  CAD with history of CABG- No active chest pain. Continue aspirin, statin.  Hypertension: Blood pressure stable. Hold losartan due to AKI. Hold Lasix as he is eating  poor.    Continue zofran for nausea. Constipation regimen ordered. Out of bed to chair. Incentive spirometry. Nursing supportive care. Fall, aspiration precautions. DVT prophylaxis   Code Status: Full Code  Subjective: Patient is seen and examined today morning.  Patient feels better, nausea improved.  Eating better than before. He is waiting for SNF placement.  Physical Exam: Vitals:   07/09/23 1604 07/09/23 1936 07/10/23 0501 07/10/23 0735  BP: (!) 169/63 (!) 160/61 (!) 160/66 (!) 180/56  Pulse: (!) 57 65 65 61  Resp: 16 16 16 18   Temp: 97.8 F (36.6 C) 98.2 F (36.8 C) 98.4 F (36.9 C) 98.2 F (36.8 C)  TempSrc: Oral Oral Oral Oral  SpO2: 98% 100% 98% 99%  Weight:      Height:        General - Elderly weak Caucasian male, lying, no apparent distress HEENT - PERRLA, EOMI, atraumatic head, non tender sinuses. Lung - Clear, diffuse rales, rhonchi, wheezes. Heart - S1, S2 heard, no murmurs, rubs, trace pedal edema. Abdomen - Soft, non tender, bowel sounds good Neuro - alert, awake and oriented, weak, non focal exam Skin - Warm and dry.  Data Reviewed:      Latest Ref Rng & Units 07/09/2023    6:08 AM 07/05/2023    6:11 AM 07/03/2023    6:18 AM  CBC  WBC 4.0 - 10.5 K/uL 3.7  3.8  5.0   Hemoglobin 13.0 - 17.0 g/dL 81.1  91.4  78.2   Hematocrit 39.0 - 52.0 % 33.3  33.1  30.9   Platelets 150 - 400 K/uL 143  123  129       Latest Ref Rng & Units 07/09/2023    6:08 AM 07/05/2023    6:11 AM 07/03/2023  6:18 AM  BMP  Glucose 70 - 99 mg/dL 161  096  62   BUN 8 - 23 mg/dL 13  14  26    Creatinine 0.61 - 1.24 mg/dL 0.45  4.09  8.11   Sodium 135 - 145 mmol/L 137  137  139   Potassium 3.5 - 5.1 mmol/L 3.3  4.2  4.2   Chloride 98 - 111 mmol/L 98  103  106   CO2 22 - 32 mmol/L 27  24  18    Calcium 8.9 - 10.3 mg/dL 9.3  9.2  8.9    No results found.  Family Communication: Discussed with patient, wife at bedside, they understand and agree. All questions  answereed.  Disposition: Status is: Inpatient Remains inpatient appropriate because: weakness, safe discharge plan  Planned Discharge Destination: Skilled nursing facility     Time spent: 38 minutes  Author: Marcelino Duster, MD 07/10/2023 5:26 PM Secure chat 7am to 7pm For on call review [email or web address]

## 2023-07-10 NOTE — Plan of Care (Signed)
  Problem: Nutrition: Goal: Adequate nutrition will be maintained Outcome: Progressing   Problem: Coping: Goal: Level of anxiety will decrease Outcome: Progressing   Problem: Safety: Goal: Ability to remain free from injury will improve Outcome: Progressing   Problem: Skin Integrity: Goal: Risk for impaired skin integrity will decrease Outcome: Progressing   

## 2023-07-10 NOTE — Progress Notes (Signed)
Physical Therapy Treatment Patient Details Name: Johnathan Arnold. MRN: [id number] DOB: 1945-09-18 Today's Date: 07/10/2023   History of Present Illness Pt is a 78 y.o. male presented with worsening of generalized weakness. MD assessment includes: acute on chronic ambulation impairment, AKI on CKD IIIa, and UTI. PMHx: CAD/CABG, HTN, CKD stage IIIa, Parkinson's disease-BLE tremors.    PT Comments  Pt was pleasant and motivated to participate during the session and put forth good effort throughout. Orthostatic BPs and (HR) taken as follows:  supine 174/70 (59), sitting 157/58 (62), standing x 0 min 128/64 (66), standing x 3 min 108/59 (68), and once back in sitting in recliner at end of session 198/70 (56).  Pt was "a little dizzy" in sitting and reported "medium" dizziness in standing along with weakness.  Pt presented with min posterior instability in sitting unsupported at the EOB that resolved as the session progressed but mod instability in standing that persisted during the session requiring frequent min A to correct.  Pt is at a very high risk for falls and will benefit from continued PT services upon discharge to safely address deficits listed in patient problem list for decreased caregiver assistance and eventual return to PLOF.      If plan is discharge home, recommend the following: Assistance with cooking/housework;Assist for transportation;Help with stairs or ramp for entrance;A lot of help with walking and/or transfers;A lot of help with bathing/dressing/bathroom   Can travel by private vehicle     No  Equipment Recommendations  None recommended by PT    Recommendations for Other Services       Precautions / Restrictions Precautions Precautions: Fall Precaution Comments: orthostatic Restrictions Weight Bearing Restrictions Per Provider Order: No     Mobility  Bed Mobility Overal bed mobility: Needs Assistance       Supine to sit: Supervision     General bed  mobility comments: Extra time, effort, and use of the bed rail    Transfers Overall transfer level: Needs assistance Equipment used: Rolling walker (2 wheels) Transfers: Sit to/from Stand Sit to Stand: Min assist           General transfer comment: Min A to prevent posterior LOB    Ambulation/Gait Ambulation/Gait assistance: Min assist Gait Distance (Feet): 8 Feet Assistive device: Rolling walker (2 wheels) Gait Pattern/deviations: Step-through pattern, Decreased step length - right, Decreased step length - left, Shuffle, Trunk flexed Gait velocity: decreased     General Gait Details: Min A to prevent posterior LOB during limited amb near EOB and from bed to chair   Stairs             Wheelchair Mobility     Tilt Bed    Modified Rankin (Stroke Patients Only)       Balance Overall balance assessment: Needs assistance Sitting-balance support: Feet supported Sitting balance-Leahy Scale: Fair Sitting balance - Comments: Min posterior lean in sitting that resolved during the session   Standing balance support: Bilateral upper extremity supported, During functional activity, Reliant on assistive device for balance Standing balance-Leahy Scale: Poor Standing balance comment: Frequent min A to prevent posterior LOB                            Cognition Arousal: Alert Behavior During Therapy: WFL for tasks assessed/performed Overall Cognitive Status: Within Functional Limits for tasks assessed  Exercises Total Joint Exercises Ankle Circles/Pumps: AROM, Strengthening, Both, 10 reps Quad Sets: Strengthening, Both, 10 reps Gluteal Sets: Strengthening, Both, 10 reps Long Arc Quad: Strengthening, Both, 10 reps Knee Flexion: Strengthening, Both, 10 reps Marching in Standing: Strengthening, Both, 10 reps, Standing Other Exercises: standing anterior weight shifting activities to address posterior  lean  Other Exercises: BLE heel raises in standing x 10    General Comments        Pertinent Vitals/Pain Pain Assessment Pain Assessment: No/denies pain    Home Living                          Prior Function            PT Goals (current goals can now be found in the care plan section) Progress towards PT goals: Not progressing toward goals - comment (limited by orthostatic BP)    Frequency    Min 1X/week      PT Plan      Co-evaluation              AM-PAC PT "6 Clicks" Mobility   Outcome Measure  Help needed turning from your back to your side while in a flat bed without using bedrails?: A Little Help needed moving from lying on your back to sitting on the side of a flat bed without using bedrails?: A Little Help needed moving to and from a bed to a chair (including a wheelchair)?: A Little Help needed standing up from a chair using your arms (e.g., wheelchair or bedside chair)?: A Little Help needed to walk in hospital room?: A Lot Help needed climbing 3-5 steps with a railing? : Total 6 Click Score: 15    End of Session Equipment Utilized During Treatment: Gait belt Activity Tolerance: Other (comment) (limited by orthostatic hypotension) Patient left: in chair;with call bell/phone within reach;with chair alarm set Nurse Communication: Mobility status;Other (comment) (nsg and MD notified of orthostatic BPs per above) PT Visit Diagnosis: Unsteadiness on feet (R26.81);History of falling (Z91.81);Difficulty in walking, not elsewhere classified (R26.2);Muscle weakness (generalized) (M62.81)     Time: 1610-9604 PT Time Calculation (min) (ACUTE ONLY): 32 min  Charges:    $Therapeutic Exercise: 8-22 mins $Therapeutic Activity: 8-22 mins PT General Charges $$ ACUTE PT VISIT: 1 Visit                     D. Scott Odell Fasching PT, DPT 07/10/23, 10:57 AM

## 2023-07-11 DIAGNOSIS — R350 Frequency of micturition: Secondary | ICD-10-CM

## 2023-07-11 DIAGNOSIS — G20A1 Parkinson's disease without dyskinesia, without mention of fluctuations: Secondary | ICD-10-CM | POA: Diagnosis not present

## 2023-07-11 DIAGNOSIS — E7800 Pure hypercholesterolemia, unspecified: Secondary | ICD-10-CM

## 2023-07-11 DIAGNOSIS — I10 Essential (primary) hypertension: Secondary | ICD-10-CM

## 2023-07-11 DIAGNOSIS — N401 Enlarged prostate with lower urinary tract symptoms: Secondary | ICD-10-CM

## 2023-07-11 DIAGNOSIS — N1831 Chronic kidney disease, stage 3a: Secondary | ICD-10-CM | POA: Diagnosis not present

## 2023-07-11 DIAGNOSIS — R262 Difficulty in walking, not elsewhere classified: Secondary | ICD-10-CM | POA: Diagnosis not present

## 2023-07-11 LAB — BASIC METABOLIC PANEL
Anion gap: 7 (ref 5–15)
BUN: 11 mg/dL (ref 8–23)
CO2: 28 mmol/L (ref 22–32)
Calcium: 8.7 mg/dL — ABNORMAL LOW (ref 8.9–10.3)
Chloride: 102 mmol/L (ref 98–111)
Creatinine, Ser: 1.36 mg/dL — ABNORMAL HIGH (ref 0.61–1.24)
GFR, Estimated: 53 mL/min — ABNORMAL LOW (ref >60)
Glucose, Bld: 108 mg/dL — ABNORMAL HIGH (ref 70–99)
Potassium: 3.9 mmol/L (ref 3.5–5.1)
Sodium: 137 mmol/L (ref 135–145)

## 2023-07-11 LAB — CBC
HCT: 29.6 % — ABNORMAL LOW (ref 39.0–52.0)
Hemoglobin: 10.3 g/dL — ABNORMAL LOW (ref 13.0–17.0)
MCH: 34.8 pg — ABNORMAL HIGH (ref 26.0–34.0)
MCHC: 34.8 g/dL (ref 30.0–36.0)
MCV: 100 fL (ref 80.0–100.0)
Platelets: 133 10*3/uL — ABNORMAL LOW (ref 150–400)
RBC: 2.96 MIL/uL — ABNORMAL LOW (ref 4.22–5.81)
RDW: 12.6 % (ref 11.5–15.5)
WBC: 3.5 10*3/uL — ABNORMAL LOW (ref 4.0–10.5)
nRBC: 0 % (ref 0.0–0.2)

## 2023-07-11 LAB — GLUCOSE, CAPILLARY
Glucose-Capillary: 106 mg/dL — ABNORMAL HIGH (ref 70–99)
Glucose-Capillary: 129 mg/dL — ABNORMAL HIGH (ref 70–99)
Glucose-Capillary: 143 mg/dL — ABNORMAL HIGH (ref 70–99)

## 2023-07-11 MED ORDER — AMLODIPINE BESYLATE 10 MG PO TABS
10.0000 mg | ORAL_TABLET | Freq: Every day | ORAL | Status: DC
  Administered 2023-07-11 – 2023-07-12 (×2): 10 mg via ORAL
  Filled 2023-07-11 (×2): qty 1

## 2023-07-11 MED ORDER — OLOPATADINE HCL 0.1 % OP SOLN
1.0000 [drp] | Freq: Two times a day (BID) | OPHTHALMIC | Status: DC
  Administered 2023-07-11 – 2023-07-13 (×5): 1 [drp] via OPHTHALMIC
  Filled 2023-07-11: qty 5

## 2023-07-11 NOTE — Assessment & Plan Note (Signed)
07-11-2023 baseline Scr 1.3-1.7  07-12-2023 stable.

## 2023-07-11 NOTE — Progress Notes (Signed)
PROGRESS NOTE    Johnathan Arnold.  NWG:[id number] DOB: [birth year] DOA: 07/02/2023 PCP: [name], MD  Subjective: Pt seen and examined. Awaiting SNF placement on Monday. Wife at bedside. They are watching [facility]s [employer] game.  No complaints.    Hospital Course: HPI with Brief Hospital Course Johnathan Arnold. is a 78 y.o. male with medical history significant of CAD/CABG, HTN, CKD stage IIIa, Parkinson's disease, presented with worsening of generalized weakness.   Patient was diagnosed with Parkinson disease and has not been taking Sinemet since.  It appears that the patient Parkinson's symptoms has been poorly controlled, with mainly tremors on bilateral lower extremities affecting his ambulation and since last year patient has been using roller walker to ambulate and attend intensive outpatient PT sessions at [facility] outpatient PT center.  2 weeks ago patient went to see neurology who started patient on entacapone twice daily in addition to Sinemet.  Starting 1 week ago patient started to feel weaker lower extremities, denied any numbness of lower extremities and no weakness of upper extremities.  Wife also found the patient has had increased nocturnal urination and he does feel " little burning" when he urinates.  Denied any abdominal pain no fever chills no back pains.  Last few days patient has significant decreased oral intake including meals and fluid due to worsening of ambulation.  He stopped taking Lasix and losartan for " "for my kidney"  During hospital stay, he is noted to be weak, not eating well. Finished antibiotics of UTI, entacapone stopped (notified his neurologist). He is evaluated by PT advised SNF. He had nausea 07/07/24 which did improve. Encourage oral diet, out of bed, currently awaiting SNF placement.   Significant Events: Admitted 07/02/2023 for UTI   Significant Labs: WBC 5.4, Hgb 12, plt 152 BMP 137, K 4.5, CO2 19, BUN 36, scr 2.27 UA shows trace  KE, WBC 11-20 TP 6.8, alb 4.3, AST 20, ALT 12, Alk Phos 35. T. Bili 1.7  Significant Imaging Studies: CXR No active cardiopulmonary disease   Antibiotic Therapy: Anti-infectives (From admission, onward)    Start     Dose/Rate Route Frequency Ordered Stop   07/03/23 1000  cefTRIAXone (ROCEPHIN) 1 g in sodium chloride 0.9 % 100 mL IVPB  Status:  Discontinued        1 g 200 mL/hr over 30 Minutes Intravenous Every 24 hours 07/02/23 1515 07/06/23 1144   07/02/23 1430  cefTRIAXone (ROCEPHIN) 1 g in sodium chloride 0.9 % 100 mL IVPB        1 g 200 mL/hr over 30 Minutes Intravenous  Once 07/02/23 1429 07/02/23 1600       Procedures:   Consultants:     Assessment and Plan: * Impaired ambulation Since admission. Weak, able to work with PT.. Multifactorial in the setting of Parkinson's disease, UTI. He thinks Entacapone causing symptoms, and wished to stop it. Sent secure chat to Dr. Arbutus Leas about dc Entacapone 07/07/23. Continue Sinemet therapy. TOC working on SNF placement.  07-11-2023 stable. Awaiting SNF placement.    UTI (urinary tract infection) Completed 5 day course of IV Rocephin 07-02-2023 through 07-06-2023.   Resolved.  Parkinson's disease (HCC) 07-11-2023 continue with Sinemet.  Pt wanted to stop Entacapone.  Orthostatic hypotension 07-11-2023 stable. Not on any midodrine.  Bradycardia 07-11-2023 chronic. No betablockers or AVN blockers  CKD stage 3a, GFR 45-59 ml/min (HCC) 07-11-2023 baseline Scr 1.3-1.7  Benign prostatic hyperplasia with urinary frequency 07-11-2023 continue with flomax.  Hyperlipidemia 07-11-2023 continue with crestor 10 mg qday.  Essential hypertension 07-11-2023 on imdur 60 mg daily. Add norvasc 10 mg daily.   DVT prophylaxis: heparin injection 5,000 Units Start: 07/02/23 2200    Code Status: Full Code Family Communication: discussed with pt. Wife appears to be suffering from effects from a brain injury Disposition Plan: SNF Reason  for continuing need for hospitalization: medically stable.  Objective: Vitals:   07/10/23 2041 07/11/23 0343 07/11/23 0355 07/11/23 0838  BP: (!) 143/52 (!) 194/64 (!) 189/69 (!) 188/55  Pulse: (!) 56 (!) 59  (!) 53  Resp: 20 16  18   Temp: 98.9 F (37.2 C) 98.4 F (36.9 C)  98 F (36.7 C)  TempSrc: Oral Oral  Oral  SpO2: 99% 99%  99%  Weight:      Height:        Intake/Output Summary (Last 24 hours) at 07/11/2023 1448 Last data filed at 07/11/2023 1300 Gross per 24 hour  Intake 720 ml  Output 1375 ml  Net -655 ml   Filed Weights   07/02/23 1041  Weight: 74 kg   Examination:  Physical Exam Vitals and nursing note reviewed.  HENT:     Head: Normocephalic and atraumatic.     Nose: Nose normal.  Eyes:     General: No scleral icterus. Cardiovascular:     Rate and Rhythm: Normal rate and regular rhythm.     Heart sounds: Murmur heard.  Pulmonary:     Effort: Pulmonary effort is normal.  Abdominal:     General: Bowel sounds are normal. There is no distension.     Tenderness: There is no abdominal tenderness.  Musculoskeletal:     Right lower leg: No edema.     Left lower leg: No edema.  Skin:    General: Skin is warm and dry.     Capillary Refill: Capillary refill takes less than 2 seconds.  Neurological:     Mental Status: He is alert and oriented to person, place, and time.    Data Reviewed: I have personally reviewed following labs and imaging studies  CBC: Recent Labs  Lab 07/05/23 0611 07/09/23 0608 07/11/23 0553  WBC 3.8* 3.7* 3.5*  HGB 11.5* 11.9* 10.3*  HCT 33.1* 33.3* 29.6*  MCV 100.3* 97.9 100.0  PLT 123* 143* 133*   Basic Metabolic Panel: Recent Labs  Lab 07/05/23 0611 07/09/23 0608 07/11/23 0553  NA 137 137 137  K 4.2 3.3* 3.9  CL 103 98 102  CO2 24 27 28   GLUCOSE 115* 106* 108*  BUN 14 13 11   CREATININE 1.34* 1.56* 1.36*  CALCIUM 9.2 9.3 8.7*   GFR: Estimated Creatinine Clearance: 40.4 mL/min (A) (by C-G formula based on SCr of  1.36 mg/dL (H)).  CBG: Recent Labs  Lab 07/10/23 1139 07/10/23 1734 07/10/23 2043 07/11/23 0840 07/11/23 1143  GLUCAP 135* 130* 136* 106* 129*   Recent Results (from the past 240 hours)  Blood culture (routine x 2)     Status: None   Collection Time: 07/02/23 12:20 PM   Specimen: BLOOD  Result Value Ref Range Status   Specimen Description BLOOD RIGHT ANTECUBITAL  Final   Special Requests   Final    BOTTLES DRAWN AEROBIC AND ANAEROBIC Blood Culture adequate volume   Culture   Final    NO GROWTH 5 DAYS Performed at [facility], [address], [place], [place] [address]    Report Status 07/07/2023 FINAL  Final  Blood culture (routine  x 2)     Status: Abnormal   Collection Time: 07/02/23 12:20 PM   Specimen: BLOOD  Result Value Ref Range Status   Specimen Description   Final    BLOOD LEFT ANTECUBITAL Performed at [facility], [address], [place], [place] [address]    Special Requests   Final    BOTTLES DRAWN AEROBIC AND ANAEROBIC Blood Culture adequate volume Performed at [facility], [address] [place], [place] [address]    Culture  Setup Time   Final    GRAM POSITIVE COCCI IN BOTH AEROBIC AND ANAEROBIC BOTTLES CRITICAL RESULT CALLED TO, READ BACK BY AND VERIFIED WITH: Mila Merry AT 8295 07/03/23.PMF    Culture (A)  Final    STAPHYLOCOCCUS HAEMOLYTICUS STAPHYLOCOCCUS EPIDERMIDIS THE SIGNIFICANCE OF ISOLATING THIS ORGANISM FROM A SINGLE SET OF BLOOD CULTURES WHEN MULTIPLE SETS ARE DRAWN IS UNCERTAIN. PLEASE NOTIFY THE MICROBIOLOGY DEPARTMENT WITHIN ONE WEEK IF SPECIATION AND SENSITIVITIES ARE REQUIRED. Performed at [facility], [address] [place], [place] [address]    Report Status 07/06/2023 FINAL  Final  Blood Culture ID Panel (Reflexed)     Status: Abnormal   Collection Time: 07/02/23 12:20 PM  Result Value Ref Range Status   Enterococcus faecalis NOT DETECTED NOT DETECTED Final   Enterococcus Faecium NOT  DETECTED NOT DETECTED Final   Listeria monocytogenes NOT DETECTED NOT DETECTED Final   Staphylococcus species DETECTED (A) NOT DETECTED Final    Comment: CRITICAL RESULT CALLED TO, READ BACK BY AND VERIFIED WITH: Mila Merry AT 8657 07/03/23.PMF    Staphylococcus aureus (BCID) NOT DETECTED NOT DETECTED Final   Staphylococcus epidermidis NOT DETECTED NOT DETECTED Final   Staphylococcus lugdunensis NOT DETECTED NOT DETECTED Final   Streptococcus species NOT DETECTED NOT DETECTED Final   Streptococcus agalactiae NOT DETECTED NOT DETECTED Final   Streptococcus pneumoniae NOT DETECTED NOT DETECTED Final   Streptococcus pyogenes NOT DETECTED NOT DETECTED Final   A.calcoaceticus-baumannii NOT DETECTED NOT DETECTED Final   Bacteroides fragilis NOT DETECTED NOT DETECTED Final   Enterobacterales NOT DETECTED NOT DETECTED Final   Enterobacter cloacae complex NOT DETECTED NOT DETECTED Final   Escherichia coli NOT DETECTED NOT DETECTED Final   Klebsiella aerogenes NOT DETECTED NOT DETECTED Final   Klebsiella oxytoca NOT DETECTED NOT DETECTED Final   Klebsiella pneumoniae NOT DETECTED NOT DETECTED Final   Proteus species NOT DETECTED NOT DETECTED Final   Salmonella species NOT DETECTED NOT DETECTED Final   Serratia marcescens NOT DETECTED NOT DETECTED Final   Haemophilus influenzae NOT DETECTED NOT DETECTED Final   Neisseria meningitidis NOT DETECTED NOT DETECTED Final   Pseudomonas aeruginosa NOT DETECTED NOT DETECTED Final   Stenotrophomonas maltophilia NOT DETECTED NOT DETECTED Final   Candida albicans NOT DETECTED NOT DETECTED Final   Candida auris NOT DETECTED NOT DETECTED Final   Candida glabrata NOT DETECTED NOT DETECTED Final   Candida krusei NOT DETECTED NOT DETECTED Final   Candida parapsilosis NOT DETECTED NOT DETECTED Final   Candida tropicalis NOT DETECTED NOT DETECTED Final   Cryptococcus neoformans/gattii NOT DETECTED NOT DETECTED Final    Comment: Performed at [facility], [address] [place], [place] [address]  Culture, blood (Routine X 2) w Reflex to ID Panel     Status: None   Collection Time: 07/05/23  6:11 AM   Specimen: BLOOD  Result Value Ref Range Status   Specimen Description BLOOD BLOOD RIGHT HAND  Final   Special Requests  Final    BOTTLES DRAWN AEROBIC AND ANAEROBIC Blood Culture results may not be optimal due to an inadequate volume of blood received in culture bottles   Culture   Final    NO GROWTH 5 DAYS Performed at [facility], [address] [place], [place] [address]    Report Status 07/10/2023 FINAL  Final  Culture, blood (Routine X 2) w Reflex to ID Panel     Status: None   Collection Time: 07/05/23  6:11 AM   Specimen: BLOOD  Result Value Ref Range Status   Specimen Description BLOOD BLOOD LEFT HAND  Final   Special Requests   Final    BOTTLES DRAWN AEROBIC AND ANAEROBIC Blood Culture adequate volume   Culture   Final    NO GROWTH 5 DAYS Performed at [facility], [address] [place], [place] [address]    Report Status 07/10/2023 FINAL  Final  Respiratory (~20 pathogens) panel by PCR     Status: None   Collection Time: 07/07/23 11:58 AM   Specimen: Nasopharyngeal Swab; Respiratory  Result Value Ref Range Status   Adenovirus NOT DETECTED NOT DETECTED Final   Coronavirus 229E NOT DETECTED NOT DETECTED Final    Comment: (NOTE) The Coronavirus on the Respiratory Panel, DOES NOT test for the novel  Coronavirus (2019 nCoV)    Coronavirus HKU1 NOT DETECTED NOT DETECTED Final   Coronavirus NL63 NOT DETECTED NOT DETECTED Final   Coronavirus OC43 NOT DETECTED NOT DETECTED Final   Metapneumovirus NOT DETECTED NOT DETECTED Final   Rhinovirus / Enterovirus NOT DETECTED NOT DETECTED Final   Influenza A NOT DETECTED NOT DETECTED Final   Influenza B NOT DETECTED NOT DETECTED Final   Parainfluenza Virus 1 NOT DETECTED NOT DETECTED Final   Parainfluenza Virus 2 NOT DETECTED NOT DETECTED Final    Parainfluenza Virus 3 NOT DETECTED NOT DETECTED Final   Parainfluenza Virus 4 NOT DETECTED NOT DETECTED Final   Respiratory Syncytial Virus NOT DETECTED NOT DETECTED Final   Bordetella pertussis NOT DETECTED NOT DETECTED Final   Bordetella Parapertussis NOT DETECTED NOT DETECTED Final   Chlamydophila pneumoniae NOT DETECTED NOT DETECTED Final   Mycoplasma pneumoniae NOT DETECTED NOT DETECTED Final    Comment: Performed at [facility], [address], [place], [place] [address]    Scheduled Meds:  amLODipine  10 mg Oral Daily   aspirin EC  81 mg Oral QHS   carbidopa-levodopa  1 tablet Oral QHS   carbidopa-levodopa  1 tablet Oral Daily   carbidopa-levodopa  2 tablet Oral TID   docusate sodium  100 mg Oral BID   heparin  5,000 Units Subcutaneous Q12H   isosorbide mononitrate  60 mg Oral Daily   olopatadine  1 drop Both Eyes BID   pantoprazole  40 mg Oral Daily   ranolazine  1,000 mg Oral BID   rosuvastatin  10 mg Oral Daily   senna  2 tablet Oral QHS   tamsulosin  0.4 mg Oral Daily   Continuous Infusions:   LOS: 9 days   Time spent: 40 minutes  Carollee Herter, DO  [facility]  07/11/2023, 2:48 PM

## 2023-07-11 NOTE — Assessment & Plan Note (Signed)
07-11-2023 continue with Sinemet.  Pt wanted to stop Entacapone. 07-12-2023 stable. On sinemet 50-200 @ 8AM, 11AM, 2PM, Sinemet 25-100 @ 5 pm, Sinemet CR 50-200 @ 10 pm.  07-13-2023 stable. Continue sinemet 5 times a day. sinemet 50-200 @ 8AM, 11AM, 2PM, Sinemet 25-100 @ 5 pm, Sinemet CR 50-200 @ 10 pm

## 2023-07-11 NOTE — Assessment & Plan Note (Signed)
Completed 5 day course of IV Rocephin 07-02-2023 through 07-06-2023.   Resolved.

## 2023-07-11 NOTE — Assessment & Plan Note (Signed)
07-11-2023 stable. Not on any midodrine.  07-12-2023 stable.

## 2023-07-11 NOTE — Assessment & Plan Note (Signed)
Since admission. Weak, able to work with PT.. Multifactorial in the setting of Parkinson's disease, UTI. He thinks Entacapone causing symptoms, and wished to stop it. Sent secure chat to Dr. Arbutus Leas about dc Entacapone 07/07/23. Continue Sinemet therapy. TOC working on SNF placement. 07-11-2023 stable. Awaiting SNF placement. 07-12-2023 awaiting SNF placement.  07-13-2023 stable. Awaiting SNF bed.

## 2023-07-11 NOTE — Assessment & Plan Note (Signed)
07-11-2023 continue with flomax.  07-12-2023 stable.

## 2023-07-11 NOTE — Subjective & Objective (Signed)
Pt seen and examined. Awaiting SNF placement on Monday. Wife at bedside. They are watching [facility] Vs [employer] game.  No complaints.

## 2023-07-11 NOTE — Assessment & Plan Note (Signed)
07-11-2023 continue with crestor 10 mg qday.  07-12-2023 stable.

## 2023-07-11 NOTE — Assessment & Plan Note (Signed)
07-11-2023 on imdur 60 mg daily. Add norvasc 10 mg daily.  07-12-2023 add prn hydralazine 50 mg q6h prn SBP >170 or DBP >100

## 2023-07-11 NOTE — Assessment & Plan Note (Signed)
07-11-2023 chronic. No betablockers or AVN blockers  07-12-2023 stable.

## 2023-07-11 NOTE — Hospital Course (Signed)
HPI with Brief Hospital Course Johnathan Arnold. is a 78 y.o. male with medical history significant of CAD/CABG, HTN, CKD stage IIIa, Parkinson's disease, presented with worsening of generalized weakness.   Patient was diagnosed with Parkinson disease and has not been taking Sinemet since.  It appears that the patient Parkinson's symptoms has been poorly controlled, with mainly tremors on bilateral lower extremities affecting his ambulation and since last year patient has been using roller walker to ambulate and attend intensive outpatient PT sessions at [facility] outpatient PT center.  2 weeks ago patient went to see neurology who started patient on entacapone twice daily in addition to Sinemet.  Starting 1 week ago patient started to feel weaker lower extremities, denied any numbness of lower extremities and no weakness of upper extremities.  Wife also found the patient has had increased nocturnal urination and he does feel " little burning" when he urinates.  Denied any abdominal pain no fever chills no back pains.  Last few days patient has significant decreased oral intake including meals and fluid due to worsening of ambulation.  He stopped taking Lasix and losartan for " "for my kidney"  During hospital stay, he is noted to be weak, not eating well. Finished antibiotics of UTI, entacapone stopped (notified his neurologist). He is evaluated by PT advised SNF. He had nausea 07/07/24 which did improve. Encourage oral diet, out of bed, currently awaiting SNF placement.   Significant Events: Admitted 07/02/2023 for UTI   Significant Labs: WBC 5.4, Hgb 12, plt 152 BMP 137, K 4.5, CO2 19, BUN 36, scr 2.27 UA shows trace KE, WBC 11-20 TP 6.8, alb 4.3, AST 20, ALT 12, Alk Phos 35. T. Bili 1.7  Significant Imaging Studies: CXR No active cardiopulmonary disease   Antibiotic Therapy: Anti-infectives (From admission, onward)    Start     Dose/Rate Route Frequency Ordered Stop   07/03/23 1000   cefTRIAXone (ROCEPHIN) 1 g in sodium chloride 0.9 % 100 mL IVPB  Status:  Discontinued        1 g 200 mL/hr over 30 Minutes Intravenous Every 24 hours 07/02/23 1515 07/06/23 1144   07/02/23 1430  cefTRIAXone (ROCEPHIN) 1 g in sodium chloride 0.9 % 100 mL IVPB        1 g 200 mL/hr over 30 Minutes Intravenous  Once 07/02/23 1429 07/02/23 1600       Procedures:   Consultants:

## 2023-07-12 DIAGNOSIS — G20A1 Parkinson's disease without dyskinesia, without mention of fluctuations: Secondary | ICD-10-CM | POA: Diagnosis not present

## 2023-07-12 DIAGNOSIS — R001 Bradycardia, unspecified: Secondary | ICD-10-CM

## 2023-07-12 DIAGNOSIS — N401 Enlarged prostate with lower urinary tract symptoms: Secondary | ICD-10-CM | POA: Diagnosis not present

## 2023-07-12 DIAGNOSIS — R262 Difficulty in walking, not elsewhere classified: Secondary | ICD-10-CM | POA: Diagnosis not present

## 2023-07-12 LAB — GLUCOSE, CAPILLARY
Glucose-Capillary: 103 mg/dL — ABNORMAL HIGH (ref 70–99)
Glucose-Capillary: 128 mg/dL — ABNORMAL HIGH (ref 70–99)
Glucose-Capillary: 185 mg/dL — ABNORMAL HIGH (ref 70–99)
Glucose-Capillary: 104 mg/dL — ABNORMAL HIGH (ref 70–99)

## 2023-07-12 MED ORDER — HYDRALAZINE HCL 50 MG PO TABS: 50.0000 mg | ORAL_TABLET | Freq: Four times a day (QID) | ORAL | Status: DC | PRN

## 2023-07-12 MED ORDER — ONDANSETRON HCL 4 MG PO TABS: 4.0000 mg | ORAL_TABLET | Freq: Four times a day (QID) | ORAL | Status: DC | PRN

## 2023-07-12 MED ORDER — LACTULOSE 10 GM/15ML PO SOLN
30.0000 g | Freq: Once | ORAL | Status: AC
Start: 2023-07-12 — End: 2023-07-12
  Administered 2023-07-12: 30 g via ORAL
  Filled 2023-07-12: qty 60

## 2023-07-12 NOTE — Progress Notes (Signed)
PROGRESS NOTE    Johnathan Arnold.  WUJ:[id number] DOB: [birth year] DOA: 07/02/2023 PCP: Johnathan Shams, MD  Subjective: Pt seen and examined.  Awaiting SNF placement on Monday.  Wife at bedside.  Pt wanting to take a shower. Wife wants him to shave.   Hospital Course: HPI with Brief Hospital Course Johnathan Arnold. is a 78 y.o. male with medical history significant of CAD/CABG, HTN, CKD stage IIIa, Parkinson's disease, presented with worsening of generalized weakness.   Patient was diagnosed with Parkinson disease and has not been taking Sinemet since.  It appears that the patient Parkinson's symptoms has been poorly controlled, with mainly tremors on bilateral lower extremities affecting his ambulation and since last year patient has been using roller walker to ambulate and attend intensive outpatient PT sessions at [facility] outpatient PT center.  2 weeks ago patient went to see neurology who started patient on entacapone twice daily in addition to Sinemet.  Starting 1 week ago patient started to feel weaker lower extremities, denied any numbness of lower extremities and no weakness of upper extremities.  Wife also found the patient has had increased nocturnal urination and he does feel " little burning" when he urinates.  Denied any abdominal pain no fever chills no back pains.  Last few days patient has significant decreased oral intake including meals and fluid due to worsening of ambulation.  He stopped taking Lasix and losartan for " "for my kidney"  During hospital stay, he is noted to be weak, not eating well. Finished antibiotics of UTI, entacapone stopped (notified his neurologist). He is evaluated by PT advised SNF. He had nausea 07/07/24 which did improve. Encourage oral diet, out of bed, currently awaiting SNF placement.   Significant Events: Admitted 07/02/2023 for UTI   Significant Labs: WBC 5.4, Hgb 12, plt 152 BMP 137, K 4.5, CO2 19, BUN 36, scr 2.27 UA shows trace KE,  WBC 11-20 TP 6.8, alb 4.3, AST 20, ALT 12, Alk Phos 35. T. Bili 1.7  Significant Imaging Studies: CXR No active cardiopulmonary disease   Antibiotic Therapy: Anti-infectives (From admission, onward)    Start     Dose/Rate Route Frequency Ordered Stop   07/03/23 1000  cefTRIAXone (ROCEPHIN) 1 g in sodium chloride 0.9 % 100 mL IVPB  Status:  Discontinued        1 g 200 mL/hr over 30 Minutes Intravenous Every 24 hours 07/02/23 1515 07/06/23 1144   07/02/23 1430  cefTRIAXone (ROCEPHIN) 1 g in sodium chloride 0.9 % 100 mL IVPB        1 g 200 mL/hr over 30 Minutes Intravenous  Once 07/02/23 1429 07/02/23 1600       Procedures:   Consultants:     Assessment and Plan: * Impaired ambulation Since admission. Weak, able to work with PT.. Multifactorial in the setting of Parkinson's disease, UTI. He thinks Entacapone causing symptoms, and wished to stop it. Sent secure chat to Dr. Arbutus Leas about dc Entacapone 07/07/23. Continue Sinemet therapy. TOC working on SNF placement. 07-11-2023 stable. Awaiting SNF placement.  07-12-2023 awaiting SNF placement.    UTI (urinary tract infection) Completed 5 day course of IV Rocephin 07-02-2023 through 07-06-2023.   Resolved.  Parkinson's disease (HCC) 07-11-2023 continue with Sinemet.  Pt wanted to stop Entacapone.  07-12-2023 stable. On sinemet 50-200 @ 8AM, 11AM, 2PM, Sinemet 25-100 @ 5 pm, Sinemet CR 50-200 @ 10 pm.  Orthostatic hypotension 07-11-2023 stable. Not on any midodrine.  07-12-2023 stable.  Bradycardia  07-11-2023 chronic. No betablockers or AVN blockers  07-12-2023 stable.  CKD stage 3a, GFR 45-59 ml/min (HCC) 07-11-2023 baseline Scr 1.3-1.7  07-12-2023 stable.  Benign prostatic hyperplasia with urinary frequency 07-11-2023 continue with flomax.  07-12-2023 stable.  Hyperlipidemia 07-11-2023 continue with crestor 10 mg qday.  07-12-2023 stable.  Essential hypertension 07-11-2023 on imdur 60 mg daily. Add norvasc  10 mg daily.  07-12-2023 add prn hydralazine 50 mg q6h prn SBP >170 or DBP >100   DVT prophylaxis: heparin injection 5,000 Units Start: 07/02/23 2200    Code Status: Full Code Family Communication: discussed with pt and wife at bedside Disposition Plan: SNF Reason for continuing need for hospitalization: medically stable for DC.  Objective: Vitals:   07/11/23 1505 07/11/23 2013 07/12/23 0328 07/12/23 0833  BP: (!) 133/51 (!) 157/59 (!) 145/69 (!) 187/55  Pulse: (!) 55 63 64 (!) 54  Resp: 18 16 16 20   Temp: 98.3 F (36.8 C) 98.6 F (37 C) 98.8 F (37.1 C) 99.1 F (37.3 C)  TempSrc: Oral Oral Oral Oral  SpO2: 100% 98% 100% 99%  Weight:      Height:        Intake/Output Summary (Last 24 hours) at 07/12/2023 1329 Last data filed at 07/12/2023 1310 Gross per 24 hour  Intake --  Output 1325 ml  Net -1325 ml   Filed Weights   07/02/23 1041  Weight: 74 kg    Examination:  Physical Exam Vitals and nursing note reviewed.  Constitutional:      General: He is not in acute distress.    Appearance: He is normal weight. He is not toxic-appearing or diaphoretic.  HENT:     Head: Normocephalic and atraumatic.     Nose: Nose normal.  Eyes:     General: No scleral icterus. Cardiovascular:     Rate and Rhythm: Normal rate and regular rhythm.  Pulmonary:     Effort: Pulmonary effort is normal. No respiratory distress.     Breath sounds: Normal breath sounds. No wheezing.  Abdominal:     General: Bowel sounds are normal.     Palpations: Abdomen is soft.  Musculoskeletal:     Right lower leg: No edema.     Left lower leg: No edema.  Skin:    General: Skin is warm and dry.     Capillary Refill: Capillary refill takes less than 2 seconds.  Neurological:     Mental Status: He is alert and oriented to person, place, and time.     Comments: Having LE tremors of feet bilaterally     Data Reviewed: I have personally reviewed following labs and imaging studies  CBC: Recent  Labs  Lab 07/09/23 0608 07/11/23 0553  WBC 3.7* 3.5*  HGB 11.9* 10.3*  HCT 33.3* 29.6*  MCV 97.9 100.0  PLT 143* 133*   Basic Metabolic Panel: Recent Labs  Lab 07/09/23 0608 07/11/23 0553  NA 137 137  K 3.3* 3.9  CL 98 102  CO2 27 28  GLUCOSE 106* 108*  BUN 13 11  CREATININE 1.56* 1.36*  CALCIUM 9.3 8.7*   GFR: Estimated Creatinine Clearance: 40.4 mL/min (A) (by C-G formula based on SCr of 1.36 mg/dL (H)). CBG: Recent Labs  Lab 07/11/23 0840 07/11/23 1143 07/11/23 2008 07/12/23 0833 07/12/23 1200  GLUCAP 106* 129* 143* 103* 128*    Recent Results (from the past 240 hours)  Culture, blood (Routine X 2) w Reflex to ID Panel     Status: None  Collection Time: 07/05/23  6:11 AM   Specimen: BLOOD  Result Value Ref Range Status   Specimen Description BLOOD BLOOD RIGHT HAND  Final   Special Requests   Final    BOTTLES DRAWN AEROBIC AND ANAEROBIC Blood Culture results may not be optimal due to an inadequate volume of blood received in culture bottles   Culture   Final    NO GROWTH 5 DAYS Performed at [facility], [address] [place], [place] [address]    Report Status 07/10/2023 FINAL  Final  Culture, blood (Routine X 2) w Reflex to ID Panel     Status: None   Collection Time: 07/05/23  6:11 AM   Specimen: BLOOD  Result Value Ref Range Status   Specimen Description BLOOD BLOOD LEFT HAND  Final   Special Requests   Final    BOTTLES DRAWN AEROBIC AND ANAEROBIC Blood Culture adequate volume   Culture   Final    NO GROWTH 5 DAYS Performed at [facility], [address] [place], [place] [address]    Report Status 07/10/2023 FINAL  Final  Respiratory (~20 pathogens) panel by PCR     Status: None   Collection Time: 07/07/23 11:58 AM   Specimen: Nasopharyngeal Swab; Respiratory  Result Value Ref Range Status   Adenovirus NOT DETECTED NOT DETECTED Final   Coronavirus 229E NOT DETECTED NOT DETECTED Final    Comment: (NOTE) The Coronavirus  on the Respiratory Panel, DOES NOT test for the novel  Coronavirus (2019 nCoV)    Coronavirus HKU1 NOT DETECTED NOT DETECTED Final   Coronavirus NL63 NOT DETECTED NOT DETECTED Final   Coronavirus OC43 NOT DETECTED NOT DETECTED Final   Metapneumovirus NOT DETECTED NOT DETECTED Final   Rhinovirus / Enterovirus NOT DETECTED NOT DETECTED Final   Influenza A NOT DETECTED NOT DETECTED Final   Influenza B NOT DETECTED NOT DETECTED Final   Parainfluenza Virus 1 NOT DETECTED NOT DETECTED Final   Parainfluenza Virus 2 NOT DETECTED NOT DETECTED Final   Parainfluenza Virus 3 NOT DETECTED NOT DETECTED Final   Parainfluenza Virus 4 NOT DETECTED NOT DETECTED Final   Respiratory Syncytial Virus NOT DETECTED NOT DETECTED Final   Bordetella pertussis NOT DETECTED NOT DETECTED Final   Bordetella Parapertussis NOT DETECTED NOT DETECTED Final   Chlamydophila pneumoniae NOT DETECTED NOT DETECTED Final   Mycoplasma pneumoniae NOT DETECTED NOT DETECTED Final    Comment: Performed at [facility], [address] [place], [place] [address]     Radiology Studies: No results found.  Scheduled Meds:  amLODipine  10 mg Oral Daily   aspirin EC  81 mg Oral QHS   carbidopa-levodopa  1 tablet Oral QHS   carbidopa-levodopa  1 tablet Oral Daily   carbidopa-levodopa  2 tablet Oral TID   docusate sodium  100 mg Oral BID   heparin  5,000 Units Subcutaneous Q12H   isosorbide mononitrate  60 mg Oral Daily   olopatadine  1 drop Both Eyes BID   pantoprazole  40 mg Oral Daily   ranolazine  1,000 mg Oral BID   rosuvastatin  10 mg Oral Daily   senna  2 tablet Oral QHS   tamsulosin  0.4 mg Oral Daily   Continuous Infusions:   LOS: 10 days   Time spent: 40 minutes  Carollee Herter, DO  [facility]  07/12/2023, 1:29 PM

## 2023-07-12 NOTE — Plan of Care (Signed)
   Problem: Education: Goal: Knowledge of General Education information will improve Description Including pain rating scale, medication(s)/side effects and non-pharmacologic comfort measures Outcome: Progressing   Problem: Health Behavior/Discharge Planning: Goal: Ability to manage health-related needs will improve Outcome: Progressing

## 2023-07-13 DIAGNOSIS — N401 Enlarged prostate with lower urinary tract symptoms: Secondary | ICD-10-CM | POA: Diagnosis not present

## 2023-07-13 DIAGNOSIS — J309 Allergic rhinitis, unspecified: Secondary | ICD-10-CM | POA: Diagnosis not present

## 2023-07-13 DIAGNOSIS — N390 Urinary tract infection, site not specified: Secondary | ICD-10-CM | POA: Diagnosis not present

## 2023-07-13 DIAGNOSIS — N1831 Chronic kidney disease, stage 3a: Secondary | ICD-10-CM | POA: Diagnosis not present

## 2023-07-13 DIAGNOSIS — Z7401 Bed confinement status: Secondary | ICD-10-CM | POA: Diagnosis not present

## 2023-07-13 DIAGNOSIS — R251 Tremor, unspecified: Secondary | ICD-10-CM | POA: Diagnosis not present

## 2023-07-13 DIAGNOSIS — R262 Difficulty in walking, not elsewhere classified: Secondary | ICD-10-CM | POA: Diagnosis not present

## 2023-07-13 DIAGNOSIS — Z7982 Long term (current) use of aspirin: Secondary | ICD-10-CM | POA: Diagnosis not present

## 2023-07-13 DIAGNOSIS — I129 Hypertensive chronic kidney disease with stage 1 through stage 4 chronic kidney disease, or unspecified chronic kidney disease: Secondary | ICD-10-CM | POA: Diagnosis not present

## 2023-07-13 DIAGNOSIS — N183 Chronic kidney disease, stage 3 unspecified: Secondary | ICD-10-CM | POA: Diagnosis not present

## 2023-07-13 DIAGNOSIS — I951 Orthostatic hypotension: Secondary | ICD-10-CM | POA: Diagnosis not present

## 2023-07-13 DIAGNOSIS — E7800 Pure hypercholesterolemia, unspecified: Secondary | ICD-10-CM | POA: Diagnosis not present

## 2023-07-13 DIAGNOSIS — Z951 Presence of aortocoronary bypass graft: Secondary | ICD-10-CM | POA: Diagnosis not present

## 2023-07-13 DIAGNOSIS — G2581 Restless legs syndrome: Secondary | ICD-10-CM | POA: Diagnosis not present

## 2023-07-13 DIAGNOSIS — N400 Enlarged prostate without lower urinary tract symptoms: Secondary | ICD-10-CM | POA: Diagnosis not present

## 2023-07-13 DIAGNOSIS — G4700 Insomnia, unspecified: Secondary | ICD-10-CM | POA: Diagnosis not present

## 2023-07-13 DIAGNOSIS — K219 Gastro-esophageal reflux disease without esophagitis: Secondary | ICD-10-CM | POA: Diagnosis not present

## 2023-07-13 DIAGNOSIS — R001 Bradycardia, unspecified: Secondary | ICD-10-CM | POA: Diagnosis not present

## 2023-07-13 DIAGNOSIS — I10 Essential (primary) hypertension: Secondary | ICD-10-CM | POA: Diagnosis not present

## 2023-07-13 DIAGNOSIS — K5900 Constipation, unspecified: Secondary | ICD-10-CM | POA: Diagnosis not present

## 2023-07-13 DIAGNOSIS — N3000 Acute cystitis without hematuria: Secondary | ICD-10-CM | POA: Diagnosis not present

## 2023-07-13 DIAGNOSIS — R350 Frequency of micturition: Secondary | ICD-10-CM | POA: Diagnosis not present

## 2023-07-13 DIAGNOSIS — G20A1 Parkinson's disease without dyskinesia, without mention of fluctuations: Secondary | ICD-10-CM | POA: Diagnosis not present

## 2023-07-13 DIAGNOSIS — I2510 Atherosclerotic heart disease of native coronary artery without angina pectoris: Secondary | ICD-10-CM | POA: Diagnosis not present

## 2023-07-13 LAB — GLUCOSE, CAPILLARY
Glucose-Capillary: 111 mg/dL — ABNORMAL HIGH (ref 70–99)
Glucose-Capillary: 171 mg/dL — ABNORMAL HIGH (ref 70–99)

## 2023-07-13 MED ORDER — ONDANSETRON HCL 4 MG PO TABS: 4.0000 mg | ORAL_TABLET | Freq: Four times a day (QID) | ORAL | 0 refills | Status: DC | PRN

## 2023-07-13 MED ORDER — HYDRALAZINE HCL 50 MG PO TABS: 50.0000 mg | ORAL_TABLET | Freq: Four times a day (QID) | ORAL | Status: DC | PRN

## 2023-07-13 MED ORDER — DOCUSATE SODIUM 100 MG PO CAPS: 100.0000 mg | ORAL_CAPSULE | Freq: Two times a day (BID) | ORAL | 0 refills | Status: DC

## 2023-07-13 MED ORDER — AMLODIPINE BESYLATE 10 MG PO TABS
10.0000 mg | ORAL_TABLET | Freq: Every day | ORAL | Status: DC
  Administered 2023-07-13: 10 mg via ORAL
  Filled 2023-07-13: qty 1

## 2023-07-13 MED ORDER — OLOPATADINE HCL 0.1 % OP SOLN: 1.0000 [drp] | Freq: Two times a day (BID) | OPHTHALMIC | 12 refills | Status: DC

## 2023-07-13 MED ORDER — SENNA 8.6 MG PO TABS: 2.0000 | ORAL_TABLET | Freq: Every day | ORAL | 0 refills | Status: DC

## 2023-07-13 MED ORDER — AMLODIPINE BESYLATE 10 MG PO TABS: 10.0000 mg | ORAL_TABLET | Freq: Every day | ORAL | Status: DC

## 2023-07-13 NOTE — Progress Notes (Signed)
PROGRESS NOTE    Johnathan Arnold.  GNF:[id number] DOB: 1946/02/13 DOA: 07/02/2023 PCP: [name], MD  Subjective: Pt seen and examined.  Wife at bedside. Pt shaved his beard off yesterday. Awaiting SNF bed.   Hospital Course: HPI with Brief Hospital Course Daaiel Arnold. is a 78 y.o. male with medical history significant of CAD/CABG, HTN, CKD stage IIIa, Parkinson's disease, presented with worsening of generalized weakness.   Patient was diagnosed with Parkinson disease and has not been taking Sinemet since.  It appears that the patient Parkinson's symptoms has been poorly controlled, with mainly tremors on bilateral lower extremities affecting his ambulation and since last year patient has been using roller walker to ambulate and attend intensive outpatient PT sessions at [facility] outpatient PT center.  2 weeks ago patient went to see neurology who started patient on entacapone twice daily in addition to Sinemet.  Starting 1 week ago patient started to feel weaker lower extremities, denied any numbness of lower extremities and no weakness of upper extremities.  Wife also found the patient has had increased nocturnal urination and he does feel " little burning" when he urinates.  Denied any abdominal pain no fever chills no back pains.  Last few days patient has significant decreased oral intake including meals and fluid due to worsening of ambulation.  He stopped taking Lasix and losartan for " "for my kidney"  During hospital stay, he is noted to be weak, not eating well. Finished antibiotics of UTI, entacapone stopped (notified his neurologist). He is evaluated by PT advised SNF. He had nausea 07/07/24 which did improve. Encourage oral diet, out of bed, currently awaiting SNF placement.   Significant Events: Admitted 07/02/2023 for UTI   Significant Labs: WBC 5.4, Hgb 12, plt 152 BMP 137, K 4.5, CO2 19, BUN 36, scr 2.27 UA shows trace KE, WBC 11-20 TP 6.8, alb 4.3, AST 20, ALT  12, Alk Phos 35. T. Bili 1.7  Significant Imaging Studies: CXR No active cardiopulmonary disease   Antibiotic Therapy: Anti-infectives (From admission, onward)    Start     Dose/Rate Route Frequency Ordered Stop   07/03/23 1000  cefTRIAXone (ROCEPHIN) 1 g in sodium chloride 0.9 % 100 mL IVPB  Status:  Discontinued        1 g 200 mL/hr over 30 Minutes Intravenous Every 24 hours 07/02/23 1515 07/06/23 1144   07/02/23 1430  cefTRIAXone (ROCEPHIN) 1 g in sodium chloride 0.9 % 100 mL IVPB        1 g 200 mL/hr over 30 Minutes Intravenous  Once 07/02/23 1429 07/02/23 1600       Procedures:   Consultants:     Assessment and Plan: * Impaired ambulation Since admission. Weak, able to work with PT.. Multifactorial in the setting of Parkinson's disease, UTI. He thinks Entacapone causing symptoms, and wished to stop it. Sent secure chat to Dr. Arbutus Leas about dc Entacapone 07/07/23. Continue Sinemet therapy. TOC working on SNF placement. 07-11-2023 stable. Awaiting SNF placement. 07-12-2023 awaiting SNF placement.  07-13-2023 stable. Awaiting SNF bed.    UTI (urinary tract infection) Completed 5 day course of IV Rocephin 07-02-2023 through 07-06-2023.   Resolved.  Parkinson's disease (HCC) 07-11-2023 continue with Sinemet.  Pt wanted to stop Entacapone. 07-12-2023 stable. On sinemet 50-200 @ 8AM, 11AM, 2PM, Sinemet 25-100 @ 5 pm, Sinemet CR 50-200 @ 10 pm.  07-13-2023 stable. Continue sinemet 5 times a day. sinemet 50-200 @ 8AM, 11AM, 2PM, Sinemet 25-100 @ 5  pm, Sinemet CR 50-200 @ 10 pm  Orthostatic hypotension 07-11-2023 stable. Not on any midodrine.  07-12-2023 stable.  Bradycardia 07-11-2023 chronic. No betablockers or AVN blockers  07-12-2023 stable.  CKD stage 3a, GFR 45-59 ml/min (HCC) 07-11-2023 baseline Scr 1.3-1.7  07-12-2023 stable.  Benign prostatic hyperplasia with urinary frequency 07-11-2023 continue with flomax.  07-12-2023  stable.  Hyperlipidemia 07-11-2023 continue with crestor 10 mg qday.  07-12-2023 stable.  Essential hypertension 07-11-2023 on imdur 60 mg daily. Add norvasc 10 mg daily.  07-12-2023 add prn hydralazine 50 mg q6h prn SBP >170 or DBP >100       DVT prophylaxis: heparin injection 5,000 Units Start: 07/02/23 2200    Code Status: Full Code Family Communication: discussed with pt and wife at bedside Disposition Plan: SNF Reason for continuing need for hospitalization: medically stable for DC.  Objective: Vitals:   07/12/23 1401 07/12/23 2035 07/13/23 0400 07/13/23 0743  BP: (!) 143/62 (!) 138/57 (!) 167/62 (!) 188/54  Pulse: 64 61 61 (!) 56  Resp: 14 16 17 18   Temp: 99.3 F (37.4 C) 98.1 F (36.7 C) 97.8 F (36.6 C) 98.3 F (36.8 C)  TempSrc: Oral  Oral   SpO2: 95% 100% 100% 100%  Weight:      Height:        Intake/Output Summary (Last 24 hours) at 07/13/2023 1317 Last data filed at 07/13/2023 0745 Gross per 24 hour  Intake 240 ml  Output 900 ml  Net -660 ml   Filed Weights   07/02/23 1041  Weight: 74 kg    Examination:  Physical Exam Vitals and nursing note reviewed.  Constitutional:      General: He is not in acute distress.    Appearance: He is not toxic-appearing or diaphoretic.  HENT:     Head: Normocephalic and atraumatic.     Comments: Shaved off his beard since yesterday    Nose: Nose normal.  Eyes:     General: No scleral icterus. Pulmonary:     Effort: Pulmonary effort is normal. No respiratory distress.  Abdominal:     General: Abdomen is flat. There is no distension.  Neurological:     Mental Status: He is alert and oriented to person, place, and time.     Comments: Tremors of bilateral feet     Data Reviewed: I have personally reviewed following labs and imaging studies  CBC: Recent Labs  Lab 07/09/23 0608 07/11/23 0553  WBC 3.7* 3.5*  HGB 11.9* 10.3*  HCT 33.3* 29.6*  MCV 97.9 100.0  PLT 143* 133*   Basic Metabolic  Panel: Recent Labs  Lab 07/09/23 0608 07/11/23 0553  NA 137 137  K 3.3* 3.9  CL 98 102  CO2 27 28  GLUCOSE 106* 108*  BUN 13 11  CREATININE 1.56* 1.36*  CALCIUM 9.3 8.7*   GFR: Estimated Creatinine Clearance: 40.4 mL/min (A) (by C-G formula based on SCr of 1.36 mg/dL (H)).  CBG: Recent Labs  Lab 07/12/23 1200 07/12/23 1703 07/12/23 2134 07/13/23 0744 07/13/23 1143  GLUCAP 128* 185* 104* 111* 171*   Recent Results (from the past 240 hours)  Culture, blood (Routine X 2) w Reflex to ID Panel     Status: None   Collection Time: 07/05/23  6:11 AM   Specimen: BLOOD  Result Value Ref Range Status   Specimen Description BLOOD BLOOD RIGHT HAND  Final   Special Requests   Final    BOTTLES DRAWN AEROBIC AND ANAEROBIC Blood Culture results  may not be optimal due to an inadequate volume of blood received in culture bottles   Culture   Final    NO GROWTH 5 DAYS Performed at [facility], [address] [place], [place] [address]    Report Status 07/10/2023 FINAL  Final  Culture, blood (Routine X 2) w Reflex to ID Panel     Status: None   Collection Time: 07/05/23  6:11 AM   Specimen: BLOOD  Result Value Ref Range Status   Specimen Description BLOOD BLOOD LEFT HAND  Final   Special Requests   Final    BOTTLES DRAWN AEROBIC AND ANAEROBIC Blood Culture adequate volume   Culture   Final    NO GROWTH 5 DAYS Performed at [facility], [address] [place], [place] [address]    Report Status 07/10/2023 FINAL  Final  Respiratory (~20 pathogens) panel by PCR     Status: None   Collection Time: 07/07/23 11:58 AM   Specimen: Nasopharyngeal Swab; Respiratory  Result Value Ref Range Status   Adenovirus NOT DETECTED NOT DETECTED Final   Coronavirus 229E NOT DETECTED NOT DETECTED Final    Comment: (NOTE) The Coronavirus on the Respiratory Panel, DOES NOT test for the novel  Coronavirus (2019 nCoV)    Coronavirus HKU1 NOT DETECTED NOT DETECTED Final    Coronavirus NL63 NOT DETECTED NOT DETECTED Final   Coronavirus OC43 NOT DETECTED NOT DETECTED Final   Metapneumovirus NOT DETECTED NOT DETECTED Final   Rhinovirus / Enterovirus NOT DETECTED NOT DETECTED Final   Influenza A NOT DETECTED NOT DETECTED Final   Influenza B NOT DETECTED NOT DETECTED Final   Parainfluenza Virus 1 NOT DETECTED NOT DETECTED Final   Parainfluenza Virus 2 NOT DETECTED NOT DETECTED Final   Parainfluenza Virus 3 NOT DETECTED NOT DETECTED Final   Parainfluenza Virus 4 NOT DETECTED NOT DETECTED Final   Respiratory Syncytial Virus NOT DETECTED NOT DETECTED Final   Bordetella pertussis NOT DETECTED NOT DETECTED Final   Bordetella Parapertussis NOT DETECTED NOT DETECTED Final   Chlamydophila pneumoniae NOT DETECTED NOT DETECTED Final   Mycoplasma pneumoniae NOT DETECTED NOT DETECTED Final    Comment: Performed at [facility], [address], [place], [place] [address]     Radiology Studies: No results found.  Scheduled Meds:  amLODipine  10 mg Oral Daily   aspirin EC  81 mg Oral QHS   carbidopa-levodopa  1 tablet Oral QHS   carbidopa-levodopa  1 tablet Oral Daily   carbidopa-levodopa  2 tablet Oral TID   docusate sodium  100 mg Oral BID   heparin  5,000 Units Subcutaneous Q12H   isosorbide mononitrate  60 mg Oral Daily   olopatadine  1 drop Both Eyes BID   pantoprazole  40 mg Oral Daily   ranolazine  1,000 mg Oral BID   rosuvastatin  10 mg Oral Daily   senna  2 tablet Oral QHS   tamsulosin  0.4 mg Oral Daily   Continuous Infusions:   LOS: 11 days   Time spent: 40 minutes  Carollee Herter, DO  [facility]  07/13/2023, 1:17 PM

## 2023-07-13 NOTE — TOC Transition Note (Signed)
Transition of Care [facility]) - Discharge Note   Patient Details  Name: Johnathan Arnold. MRN: [id number] Date of Birth: 1946-03-25  Transition of [facility]) CM/SW Contact:  Chapman Fitch, RN Phone Number: 07/13/2023, 2:55 PM   Clinical Narrative:     Patient will DC [id number]:[facility] Anticipated DC date: 07/13/23  Family notified: wife Transport [id number]  Per MD patient ready for DC to . RN, patient, patient's family, and facility notified of DC. Discharge Summary sent to facility. RN given number for report. DC packet on chart. Ambulance transport requested for patient.  TOC signing off.     Barriers to Discharge: Continued Medical Work up   Patient Goals and CMS Choice            Discharge Placement                       Discharge Plan and Services Additional resources added to the After Visit Summary for       Post Acute Care Choice:  (TBD)                               Social Drivers of Health (SDOH) Interventions SDOH Screenings   Food Insecurity: No Food Insecurity (07/02/2023)  Housing: Low Risk  (07/02/2023)  Transportation Needs: No Transportation Needs (07/02/2023)  Utilities: Not At Risk (07/02/2023)  Alcohol Screen: Low Risk  (05/19/2023)  Depression (PHQ2-9): Low Risk  (01/20/2023)  Financial Resource Strain: Low Risk  (05/19/2023)  Physical Activity: Sufficiently Active (05/19/2023)  Social Connections: Socially Integrated (07/02/2023)  Stress: No Stress Concern Present (05/19/2023)  Tobacco Use: Medium Risk (07/02/2023)     Readmission Risk Interventions     No data to display

## 2023-07-13 NOTE — Plan of Care (Signed)

## 2023-07-13 NOTE — Care Management Important Message (Signed)
Important Message  Patient Details  Name: Johnathan Arnold. MRN: [id number] Date of Birth: 09-Oct-1945   Important Message Given:  Yes - Medicare IM     Sherilyn Banker 07/13/2023, 1:52 PM

## 2023-07-13 NOTE — Plan of Care (Signed)

## 2023-07-13 NOTE — Consult Note (Signed)
Value-Based Care [facility] Liaison Consult Note    07/13/2023  Johnathan Arnold May 07, 1946 [id number]  *Coverage for [name] remote review for [facility]   Insurance: Medicare ACO REACH   Primary Care Provider: [name], MD  this provider is listed for the transition of care follow up appointments  and [facility] calls   [facility] Liaison screened the patient remotely at [facility].    The patient was screened for LLOS 11-day readmission hospitalization with noted medium risk score for unplanned readmission risk 1 hospital admissions in 6 months.  The patient was assessed for potential Community Care Coordination service needs for post hospital transition for care coordination. Review of patient's electronic medical record reveals patient is for SNF rehab at [facility] is noted from Inpatient TOC.   Plan: [facility] Liaison will continue to follow progress and disposition to asess for post hospital community care coordination/management needs.  Referral request for community care coordination: We can reach out to [facility] RN to follow for community follow up care if needed.   VBCI Community Care, Population Health does not replace or interfere with any arrangements made by the Inpatient Transition of Care team.   For questions contact:   Charlesetta Shanks, RN, BSN, CCM [place]  [facility], [facility] Liaison Direct Dial: [phone] or secure chat Email: [email or web address]

## 2023-07-13 NOTE — Discharge Summary (Signed)
[facility] Phys[facility] Discharge Summary   Patient name: Johnathan Arnold.  Admit date:     07/02/2023  Discharge date: 07/13/2023  Attending Physician: Emeline General [[id number]]  Discharge Physician: Carollee Herter   PCP: [name], MD  Admitted From: Home  Disposition:   [facility]  Recommendations for Outpatient Follow-up:  Follow up with PCP in 1-2 weeks Follow up with neurology in 2-4 weeks after discharge  Home Health:No Equipment/Devices: None  Discharge Condition:Stable CODE STATUS:FULL Diet recommendation: Heart Healthy Fluid Restriction: None  Hospital Summary: HPI with Brief Hospital Course Johnathan Arnold. is a 78 y.o. male with medical history significant of CAD/CABG, HTN, CKD stage IIIa, Parkinson's disease, presented with worsening of generalized weakness.   Patient was diagnosed with Parkinson disease and has not been taking Sinemet since.  It appears that the patient Parkinson's symptoms has been poorly controlled, with mainly tremors on bilateral lower extremities affecting his ambulation and since last year patient has been using roller walker to ambulate and attend intensive outpatient PT sessions at [facility] outpatient [facility] center.  2 weeks ago patient went to see neurology who started patient on entacapone twice daily in addition to Sinemet.  Starting 1 week ago patient started to feel weaker lower extremities, denied any numbness of lower extremities and no weakness of upper extremities.  Wife also found the patient has had increased nocturnal urination and he does feel " little burning" when he urinates.  Denied any abdominal pain no fever chills no back pains.  Last few days patient has significant decreased oral intake including meals and fluid due to worsening of ambulation.  He stopped taking Lasix and losartan for " "for my kidney"  During hospital stay, he is noted to be weak, not eating well. Finished antibiotics of UTI, entacapone stopped  (notified his neurologist). He is evaluated by PT advised SNF. He had nausea 07/07/24 which did improve. Encourage oral diet, out of bed, currently awaiting SNF placement.   Significant Events: Admitted 07/02/2023 for UTI   Significant Labs: WBC 5.4, Hgb 12, plt 152 BMP 137, K 4.5, CO2 19, BUN 36, scr 2.27 UA shows trace KE, WBC 11-20 TP 6.8, alb 4.3, AST 20, ALT 12, Alk Phos 35. T. Bili 1.7  Significant Imaging Studies: CXR No active cardiopulmonary disease   Antibiotic Therapy: Anti-infectives (From admission, onward)    Start     Dose/Rate Route Frequency Ordered Stop   07/03/23 1000  cefTRIAXone (ROCEPHIN) 1 g in sodium chloride 0.9 % 100 mL IVPB  Status:  Discontinued        1 g 200 mL/hr over 30 Minutes Intravenous Every 24 hours 07/02/23 1515 07/06/23 1144   07/02/23 1430  cefTRIAXone (ROCEPHIN) 1 g in sodium chloride 0.9 % 100 mL IVPB        1 g 200 mL/hr over 30 Minutes Intravenous  Once 07/02/23 1429 07/02/23 1600       Procedures:   Consultants:    Hospital Course by Problem: * Impaired ambulation Since admission. Weak, able to work with PT.. Multifactorial in the setting of Parkinson's disease, UTI. He thinks Entacapone causing symptoms, and wished to stop it. Sent secure chat to Dr. Arbutus Leas about dc Entacapone 07/07/23. Continue Sinemet therapy. TOC working on SNF placement. 07-11-2023 stable. Awaiting SNF placement. 07-12-2023 awaiting SNF placement.  07-13-2023 stable. Awaiting SNF bed.    UTI (urinary tract infection) Completed 5 day course of IV Rocephin 07-02-2023 through 07-06-2023.  Resolved.  Parkinson's disease (HCC) 07-11-2023 continue with Sinemet.  Pt wanted to stop Entacapone. 07-12-2023 stable. On sinemet 50-200 @ 8AM, 11AM, 2PM, Sinemet 25-100 @ 5 pm, Sinemet CR 50-200 @ 10 pm.  07-13-2023 stable. Continue sinemet 5 times a day. sinemet 50-200 @ 8AM, 11AM, 2PM, Sinemet 25-100 @ 5 pm, Sinemet CR 50-200 @ 10 pm  Orthostatic  hypotension 07-11-2023 stable. Not on any midodrine.  07-12-2023 stable.  Bradycardia 07-11-2023 chronic. No betablockers or AVN blockers  07-12-2023 stable.  CKD stage 3a, GFR 45-59 ml/min (HCC) 07-11-2023 baseline Scr 1.3-1.7  07-12-2023 stable.  Benign prostatic hyperplasia with urinary frequency 07-11-2023 continue with flomax.  07-12-2023 stable.  Hyperlipidemia 07-11-2023 continue with crestor 10 mg qday.  07-12-2023 stable.  Essential hypertension 07-11-2023 on imdur 60 mg daily. Add norvasc 10 mg daily.  07-12-2023 add prn hydralazine 50 mg q6h prn SBP >170 or DBP >100    Discharge Diagnoses:  Principal Problem:   Impaired ambulation Active Problems:   Parkinson's disease (HCC)   UTI (urinary tract infection)   Essential hypertension   Hyperlipidemia   Benign prostatic hyperplasia with urinary frequency   CKD stage 3a, GFR 45-59 ml/min (HCC)   Bradycardia   Orthostatic hypotension   Discharge Instructions  Discharge Instructions     Call MD for:  difficulty breathing, headache or visual disturbances   Complete by: As directed    Call MD for:  extreme fatigue   Complete by: As directed    Call MD for:  hives   Complete by: As directed    Call MD for:  persistant dizziness or light-headedness   Complete by: As directed    Call MD for:  persistant nausea and vomiting   Complete by: As directed    Call MD for:  redness, tenderness, or signs of infection (pain, swelling, redness, odor or green/yellow discharge around incision site)   Complete by: As directed    Call MD for:  severe uncontrolled pain   Complete by: As directed    Call MD for:  temperature >100.4   Complete by: As directed    Diet - low sodium heart healthy   Complete by: As directed    Discharge instructions   Complete by: As directed    1. Follow up with neurology in 2-4 weeks after discharge from hospital   Increase activity slowly   Complete by: As directed        Allergies as of 07/13/2023       Reactions   Paxlovid [nirmatrelvir-ritonavir]         Medication List     STOP taking these medications    clonazePAM 1 MG tablet Commonly known as: KLONOPIN   entacapone 200 MG tablet Commonly known as: COMTAN   losartan 25 MG tablet Commonly known as: COZAAR       TAKE these medications    acetaminophen 500 MG tablet Commonly known as: TYLENOL Take 500 mg by mouth as needed.   amLODipine 10 MG tablet Commonly known as: NORVASC Take 1 tablet (10 mg total) by mouth daily. Start taking on: July 14, 2023   aspirin EC 81 MG tablet Take 1 tablet (81 mg total) by mouth at bedtime.   carbidopa-levodopa 50-200 MG tablet Commonly known as: SINEMET CR TAKE 1 TABLET BY MOUTH AT BEDTIME   carbidopa-levodopa 25-100 MG tablet Commonly known as: SINEMET IR TAKE 2 TABLETS BY MOUTH AT 8AM; TAKE 2 TABLETS BY MOUTH AT 11AM; TAKE 2 TABLETS BY  MOUTH AT 2PM.; TAKE 1 TABLET BY MOUTH AT 5PM.   docusate sodium 100 MG capsule Commonly known as: COLACE Take 1 capsule (100 mg total) by mouth 2 (two) times daily.   fluticasone 50 MCG/ACT nasal spray Commonly known as: FLONASE Place 1 spray into both nostrils daily.   furosemide 20 MG tablet Commonly known as: LASIX Take 1 tablet (20 mg total) by mouth daily as needed (for swelling).   hydrALAZINE 50 MG tablet Commonly known as: APRESOLINE Take 1 tablet (50 mg total) by mouth every 6 (six) hours as needed (SBP >170 or DBP >100).   isosorbide mononitrate 60 MG 24 hr tablet Commonly known as: IMDUR TAKE 1 TABLET BY MOUTH DAILY   melatonin 3 MG Tabs tablet Take 1.5 mg by mouth at bedtime as needed.   multivitamin tablet Take 1 tablet by mouth daily.   nitroGLYCERIN 0.4 MG SL tablet Commonly known as: NITROSTAT Place 1 tablet (0.4 mg total) under the tongue every 5 (five) minutes as needed for chest pain.   olopatadine 0.1 % ophthalmic solution Commonly known as: PATANOL Place 1 drop  into both eyes 2 (two) times daily.   omeprazole 20 MG capsule Commonly known as: PRILOSEC Take 1 capsule (20 mg total) by mouth every morning.   ondansetron 4 MG tablet Commonly known as: ZOFRAN Take 1 tablet (4 mg total) by mouth every 6 (six) hours as needed for nausea or vomiting.   polyethylene glycol 17 g packet Commonly known as: MIRALAX / GLYCOLAX Take 17 g by mouth daily as needed.   ranolazine 1000 MG SR tablet Commonly known as: RANEXA TAKE 1 TABLET BY MOUTH 2 TIMES A DAY   rosuvastatin 10 MG tablet Commonly known as: CRESTOR TAKE 1 TABLET BY MOUTH DAILY   senna 8.6 MG Tabs tablet Commonly known as: SENOKOT Take 2 tablets (17.2 mg total) by mouth at bedtime.   tamsulosin 0.4 MG Caps capsule Commonly known as: FLOMAX TAKE ONE CAPSULE BY MOUTH DAILY        Allergies  Allergen Reactions   Paxlovid [Nirmatrelvir-Ritonavir]     Discharge Exam: Vitals:   07/13/23 0400 07/13/23 0743  BP: (!) 167/62 (!) 188/54  Pulse: 61 (!) 56  Resp: 17 18  Temp: 97.8 F (36.6 C) 98.3 F (36.8 C)  SpO2: 100% 100%    Physical Exam Vitals and nursing note reviewed.  Constitutional:      General: He is not in acute distress.    Appearance: He is not toxic-appearing or diaphoretic.  HENT:     Head: Normocephalic and atraumatic.     Comments: Shaved off his beard since yesterday    Nose: Nose normal.  Eyes:     General: No scleral icterus. Pulmonary:     Effort: Pulmonary effort is normal. No respiratory distress.  Abdominal:     General: Abdomen is flat. There is no distension.  Neurological:     Mental Status: He is alert and oriented to person, place, and time.     Comments: Tremors of bilateral feet     The results of significant diagnostics from this hospitalization (including imaging, microbiology, ancillary and laboratory) are listed below for reference.    Microbiology: Recent Results (from the past 240 hours)  Culture, blood (Routine X 2) w Reflex to  ID Panel     Status: None   Collection Time: 07/05/23  6:11 AM   Specimen: BLOOD  Result Value Ref Range Status   Specimen Description BLOOD BLOOD  RIGHT HAND  Final   Special Requests   Final    BOTTLES DRAWN AEROBIC AND ANAEROBIC Blood Culture results may not be optimal due to an inadequate volume of blood received in culture bottles   Culture   Final    NO GROWTH 5 DAYS Performed at [facility], [address] [place], [place] [address]    Report Status 07/10/2023 FINAL  Final  Culture, blood (Routine X 2) w Reflex to ID Panel     Status: None   Collection Time: 07/05/23  6:11 AM   Specimen: BLOOD  Result Value Ref Range Status   Specimen Description BLOOD BLOOD LEFT HAND  Final   Special Requests   Final    BOTTLES DRAWN AEROBIC AND ANAEROBIC Blood Culture adequate volume   Culture   Final    NO GROWTH 5 DAYS Performed at [facility], [address] [place], [place] [address]    Report Status 07/10/2023 FINAL  Final  Respiratory (~20 pathogens) panel by PCR     Status: None   Collection Time: 07/07/23 11:58 AM   Specimen: Nasopharyngeal Swab; Respiratory  Result Value Ref Range Status   Adenovirus NOT DETECTED NOT DETECTED Final   Coronavirus 229E NOT DETECTED NOT DETECTED Final    Comment: (NOTE) The Coronavirus on the Respiratory Panel, DOES NOT test for the novel  Coronavirus (2019 nCoV)    Coronavirus HKU1 NOT DETECTED NOT DETECTED Final   Coronavirus NL63 NOT DETECTED NOT DETECTED Final   Coronavirus OC43 NOT DETECTED NOT DETECTED Final   Metapneumovirus NOT DETECTED NOT DETECTED Final   Rhinovirus / Enterovirus NOT DETECTED NOT DETECTED Final   Influenza A NOT DETECTED NOT DETECTED Final   Influenza B NOT DETECTED NOT DETECTED Final   Parainfluenza Virus 1 NOT DETECTED NOT DETECTED Final   Parainfluenza Virus 2 NOT DETECTED NOT DETECTED Final   Parainfluenza Virus 3 NOT DETECTED NOT DETECTED Final   Parainfluenza Virus 4 NOT DETECTED NOT  DETECTED Final   Respiratory Syncytial Virus NOT DETECTED NOT DETECTED Final   Bordetella pertussis NOT DETECTED NOT DETECTED Final   Bordetella Parapertussis NOT DETECTED NOT DETECTED Final   Chlamydophila pneumoniae NOT DETECTED NOT DETECTED Final   Mycoplasma pneumoniae NOT DETECTED NOT DETECTED Final    Comment: Performed at [facility], [address], [place], [place] [address]     Labs: Basic Metabolic Panel: Recent Labs  Lab 07/09/23 0608 07/11/23 0553  NA 137 137  K 3.3* 3.9  CL 98 102  CO2 27 28  GLUCOSE 106* 108*  BUN 13 11  CREATININE 1.56* 1.36*  CALCIUM 9.3 8.7*   CBC: Recent Labs  Lab 07/09/23 0608 07/11/23 0553  WBC 3.7* 3.5*  HGB 11.9* 10.3*  HCT 33.3* 29.6*  MCV 97.9 100.0  PLT 143* 133*   CBG: Recent Labs  Lab 07/12/23 1200 07/12/23 1703 07/12/23 2134 07/13/23 0744 07/13/23 1143  GLUCAP 128* 185* 104* 111* 171*   Urinalysis    Component Value Date/Time   COLORURINE YELLOW (A) 07/09/2023 2106   APPEARANCEUR CLEAR (A) 07/09/2023 2106   APPEARANCEUR Clear 04/15/2018 0906   LABSPEC 1.008 07/09/2023 2106   LABSPEC 1.009 03/15/2014 1005   PHURINE 6.0 07/09/2023 2106   GLUCOSEU NEGATIVE 07/09/2023 2106   GLUCOSEU Negative 03/15/2014 1005   HGBUR NEGATIVE 07/09/2023 2106   BILIRUBINUR NEGATIVE 07/09/2023 2106   BILIRUBINUR Negative 04/15/2018 0906   BILIRUBINUR Negative 03/15/2014 1005   KETONESUR NEGATIVE 07/09/2023 2106  PROTEINUR 30 (A) 07/09/2023 2106   UROBILINOGEN 1.0 03/16/2012 1630   NITRITE NEGATIVE 07/09/2023 2106   LEUKOCYTESUR NEGATIVE 07/09/2023 2106   LEUKOCYTESUR Negative 03/15/2014 1005   Sepsis Labs Recent Labs  Lab 07/09/23 0608 07/11/23 0553  WBC 3.7* 3.5*   Microbiology Recent Results (from the past 240 hours)  Culture, blood (Routine X 2) w Reflex to ID Panel     Status: None   Collection Time: 07/05/23  6:11 AM   Specimen: BLOOD  Result Value Ref Range Status   Specimen Description BLOOD BLOOD  RIGHT HAND  Final   Special Requests   Final    BOTTLES DRAWN AEROBIC AND ANAEROBIC Blood Culture results may not be optimal due to an inadequate volume of blood received in culture bottles   Culture   Final    NO GROWTH 5 DAYS Performed at [facility], [address] [place], [place] [address]    Report Status 07/10/2023 FINAL  Final  Culture, blood (Routine X 2) w Reflex to ID Panel     Status: None   Collection Time: 07/05/23  6:11 AM   Specimen: BLOOD  Result Value Ref Range Status   Specimen Description BLOOD BLOOD LEFT HAND  Final   Special Requests   Final    BOTTLES DRAWN AEROBIC AND ANAEROBIC Blood Culture adequate volume   Culture   Final    NO GROWTH 5 DAYS Performed at [facility], [address] [place], [place] [address]    Report Status 07/10/2023 FINAL  Final  Respiratory (~20 pathogens) panel by PCR     Status: None   Collection Time: 07/07/23 11:58 AM   Specimen: Nasopharyngeal Swab; Respiratory  Result Value Ref Range Status   Adenovirus NOT DETECTED NOT DETECTED Final   Coronavirus 229E NOT DETECTED NOT DETECTED Final    Comment: (NOTE) The Coronavirus on the Respiratory Panel, DOES NOT test for the novel  Coronavirus (2019 nCoV)    Coronavirus HKU1 NOT DETECTED NOT DETECTED Final   Coronavirus NL63 NOT DETECTED NOT DETECTED Final   Coronavirus OC43 NOT DETECTED NOT DETECTED Final   Metapneumovirus NOT DETECTED NOT DETECTED Final   Rhinovirus / Enterovirus NOT DETECTED NOT DETECTED Final   Influenza A NOT DETECTED NOT DETECTED Final   Influenza B NOT DETECTED NOT DETECTED Final   Parainfluenza Virus 1 NOT DETECTED NOT DETECTED Final   Parainfluenza Virus 2 NOT DETECTED NOT DETECTED Final   Parainfluenza Virus 3 NOT DETECTED NOT DETECTED Final   Parainfluenza Virus 4 NOT DETECTED NOT DETECTED Final   Respiratory Syncytial Virus NOT DETECTED NOT DETECTED Final   Bordetella pertussis NOT DETECTED NOT DETECTED Final   Bordetella  Parapertussis NOT DETECTED NOT DETECTED Final   Chlamydophila pneumoniae NOT DETECTED NOT DETECTED Final   Mycoplasma pneumoniae NOT DETECTED NOT DETECTED Final    Comment: Performed at [facility], [address] [place], [place] [address]    Procedures/Studies: DG Chest 2 View Result Date: 07/02/2023 CLINICAL DATA:  Weakness. EXAM: CHEST - 2 VIEW COMPARISON:  Chest radiograph dated 07/06/2021. FINDINGS: No focal consolidation, pleural effusion, or pneumothorax. Stable cardiac silhouette. Median sternotomy wires and CABG vascular clips. No acute osseous pathology. IMPRESSION: No active cardiopulmonary disease. Electronically Signed   By: Elgie Collard M.D.   On: 07/02/2023 12:31    Time coordinating discharge: 45 mins  SIGNED:  Carollee Herter, DO [facility] 07/13/23, 1:40 PM

## 2023-07-15 ENCOUNTER — Ambulatory Visit: Payer: Medicare Other | Admitting: Physical Therapy

## 2023-07-15 DIAGNOSIS — G20A1 Parkinson's disease without dyskinesia, without mention of fluctuations: Secondary | ICD-10-CM | POA: Diagnosis not present

## 2023-07-15 DIAGNOSIS — N390 Urinary tract infection, site not specified: Secondary | ICD-10-CM | POA: Diagnosis not present

## 2023-07-15 DIAGNOSIS — E7800 Pure hypercholesterolemia, unspecified: Secondary | ICD-10-CM | POA: Diagnosis not present

## 2023-07-15 DIAGNOSIS — N400 Enlarged prostate without lower urinary tract symptoms: Secondary | ICD-10-CM | POA: Diagnosis not present

## 2023-07-15 DIAGNOSIS — R001 Bradycardia, unspecified: Secondary | ICD-10-CM | POA: Diagnosis not present

## 2023-07-15 DIAGNOSIS — G2581 Restless legs syndrome: Secondary | ICD-10-CM | POA: Diagnosis not present

## 2023-07-15 DIAGNOSIS — N183 Chronic kidney disease, stage 3 unspecified: Secondary | ICD-10-CM | POA: Diagnosis not present

## 2023-07-15 DIAGNOSIS — K219 Gastro-esophageal reflux disease without esophagitis: Secondary | ICD-10-CM | POA: Diagnosis not present

## 2023-07-15 DIAGNOSIS — I129 Hypertensive chronic kidney disease with stage 1 through stage 4 chronic kidney disease, or unspecified chronic kidney disease: Secondary | ICD-10-CM | POA: Diagnosis not present

## 2023-07-15 DIAGNOSIS — K5900 Constipation, unspecified: Secondary | ICD-10-CM | POA: Diagnosis not present

## 2023-07-15 DIAGNOSIS — I951 Orthostatic hypotension: Secondary | ICD-10-CM | POA: Diagnosis not present

## 2023-07-15 DIAGNOSIS — I2510 Atherosclerotic heart disease of native coronary artery without angina pectoris: Secondary | ICD-10-CM | POA: Diagnosis not present

## 2023-07-17 DIAGNOSIS — K219 Gastro-esophageal reflux disease without esophagitis: Secondary | ICD-10-CM | POA: Diagnosis not present

## 2023-07-17 DIAGNOSIS — R001 Bradycardia, unspecified: Secondary | ICD-10-CM | POA: Diagnosis not present

## 2023-07-17 DIAGNOSIS — N183 Chronic kidney disease, stage 3 unspecified: Secondary | ICD-10-CM | POA: Diagnosis not present

## 2023-07-17 DIAGNOSIS — I129 Hypertensive chronic kidney disease with stage 1 through stage 4 chronic kidney disease, or unspecified chronic kidney disease: Secondary | ICD-10-CM | POA: Diagnosis not present

## 2023-07-17 DIAGNOSIS — G2581 Restless legs syndrome: Secondary | ICD-10-CM | POA: Diagnosis not present

## 2023-07-17 DIAGNOSIS — G20A1 Parkinson's disease without dyskinesia, without mention of fluctuations: Secondary | ICD-10-CM | POA: Diagnosis not present

## 2023-07-17 DIAGNOSIS — K5900 Constipation, unspecified: Secondary | ICD-10-CM | POA: Diagnosis not present

## 2023-07-17 DIAGNOSIS — N390 Urinary tract infection, site not specified: Secondary | ICD-10-CM | POA: Diagnosis not present

## 2023-07-17 DIAGNOSIS — I2510 Atherosclerotic heart disease of native coronary artery without angina pectoris: Secondary | ICD-10-CM | POA: Diagnosis not present

## 2023-07-17 DIAGNOSIS — N400 Enlarged prostate without lower urinary tract symptoms: Secondary | ICD-10-CM | POA: Diagnosis not present

## 2023-07-17 DIAGNOSIS — E7800 Pure hypercholesterolemia, unspecified: Secondary | ICD-10-CM | POA: Diagnosis not present

## 2023-07-17 DIAGNOSIS — I951 Orthostatic hypotension: Secondary | ICD-10-CM | POA: Diagnosis not present

## 2023-07-20 ENCOUNTER — Telehealth: Payer: Self-pay

## 2023-07-20 NOTE — Progress Notes (Signed)
07/22/23 1:24 PM   Johnathan Arnold 1945-07-01 [id number]  Referring provider:  [name], MD [address] [place],  [place] [address]  Urological history 1. BXO/penile adhesions -s/p penile biopsy, cystoscopy and meatal dilation in 2015  2. BPH with LU TS -PSA on 09/06/2020 was 1.10.  - tamsulosin 0.4 mg daily   Chief Complaint  Patient presents with   Follow-up   HPI: Johnathan Arnold. is a 78 y.o.male who presents today for yearly visit with his wife, Claris Che.  Previous records reviewed.     He had recently been hospitalized for multifactorial weakness.  He was also found to have a UTI.  I PSS 15/3  PVR 16 mL   He has no UTI symptoms today.  Patient denies any modifying or aggravating factors.  He has nocturia x 4, but he uses a urinal at night.  He does have sleep apnea, but he does not sleep with CPAP machine.  He has very little urine output during the day.  Patient denies any gross hematuria, dysuria or suprapubic/flank pain.  Patient denies any fevers, chills, nausea or vomiting.     IPSS     Row Name 07/22/23 1300         International Prostate Symptom Score   How often have you had the sensation of not emptying your bladder? Almost always  Simultaneous filing. User may not have seen previous data.     How often have you had to urinate less than every two hours? Less than half the time  Simultaneous filing. User may not have seen previous data.     How often have you found you stopped and started again several times when you urinated? Less than half the time  Simultaneous filing. User may not have seen previous data.     How often have you found it difficult to postpone urination? Not at All  Simultaneous filing. User may not have seen previous data.     How often have you had a weak urinary stream? Less than half the time  Simultaneous filing. User may not have seen previous data.     How often have you had to strain to start  urination? Less than 1 in 5 times  Simultaneous filing. User may not have seen previous data.     How many times did you typically get up at night to urinate? 3 Times  Simultaneous filing. User may not have seen previous data.     Total IPSS Score 15  Simultaneous filing. User may not have seen previous data.       Quality of Life due to urinary symptoms   If you were to spend the rest of your life with your urinary condition just the way it is now how would you feel about that? Mixed  Simultaneous filing. User may not have seen previous data.               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 LIMA to LAD, SVG to D1, OM1 and RPDA   CORONARY ARTERY BYPASS GRAFT  03/1998   5 vessel, [facility]   RIGHT HEART CATH N/A 04/04/2019   Procedure: RIGHT HEART CATH;  Surgeon: Iran Ouch, MD;  Location: [facility] INVASIVE CV LAB;  Service: Cardiovascular;  Laterality: N/A;   RIGHT/LEFT HEART CATH AND CORONARY ANGIOGRAPHY N/A 02/01/2018   Procedure: RIGHT/LEFT HEART CATH AND CORONARY ANGIOGRAPHY;  Surgeon: Iran Ouch, MD;  Location: [facility] INVASIVE CV LAB;  Service: Cardiovascular;  Laterality: N/A;    Home Medications:  Allergies as of 07/22/2023       Reactions   Paxlovid [nirmatrelvir-ritonavir]         Medication List        Accurate as of July 22, 2023  1:24 PM. If you have any questions, ask your nurse or doctor.          STOP taking these medications    docusate sodium  100 MG capsule Commonly known as: COLACE   fluticasone 50 MCG/ACT nasal spray Commonly known as: FLONASE   olopatadine 0.1 % ophthalmic solution Commonly known as: PATANOL   senna 8.6 MG Tabs tablet Commonly known as: SENOKOT       TAKE these medications    acetaminophen 500 MG tablet Commonly known as: TYLENOL Take 500 mg by mouth as needed.   amLODipine 10 MG tablet Commonly known as: NORVASC Take 1 tablet (10 mg total) by mouth daily.   aspirin EC 81 MG tablet Take 1 tablet (81 mg total) by mouth at bedtime.   carbidopa-levodopa 50-200 MG tablet Commonly known as: SINEMET CR TAKE 1 TABLET BY MOUTH AT BEDTIME   carbidopa-levodopa 25-100 MG tablet Commonly known as: SINEMET IR TAKE 2 TABLETS BY MOUTH AT 8AM; TAKE 2 TABLETS BY MOUTH AT 11AM; TAKE 2 TABLETS BY MOUTH AT 2PM.; TAKE 1 TABLET BY MOUTH AT 5PM.   clonazePAM 1 MG tablet Commonly known as: KLONOPIN Take 1 mg by mouth at bedtime as needed.   furosemide 20 MG tablet Commonly known as: LASIX Take 1 tablet (20 mg total) by mouth daily as needed (for swelling).   hydrALAZINE 50 MG tablet Commonly known as: APRESOLINE Take 1 tablet (50 mg total) by mouth every 6 (six) hours as needed (SBP >170 or DBP >100).   isosorbide mononitrate 60 MG 24 hr tablet Commonly known as: IMDUR TAKE 1 TABLET BY MOUTH DAILY   melatonin 3 MG Tabs tablet Take 1.5 mg by mouth at bedtime as needed.   multivitamin tablet Take 1 tablet by mouth daily.   nitroGLYCERIN 0.4 MG SL tablet Commonly known as: NITROSTAT Place 1 tablet (0.4 mg total) under the tongue every 5 (five) minutes as needed for chest pain.   omeprazole 20 MG capsule Commonly known as: PRILOSEC Take 1 capsule (20 mg total) by mouth every morning.   ondansetron 4 MG tablet Commonly known as: ZOFRAN Take 1 tablet (4 mg total) by mouth every 6 (six) hours as needed for nausea or vomiting.   polyethylene glycol 17 g packet Commonly known as: MIRALAX /  GLYCOLAX Take 17 g by mouth daily as needed.   ranolazine 1000 MG SR tablet Commonly known as: RANEXA TAKE 1 TABLET BY MOUTH 2 TIMES A DAY   rosuvastatin 10 MG tablet Commonly known as: CRESTOR TAKE 1 TABLET BY MOUTH DAILY   tamsulosin 0.4  MG Caps capsule Commonly known as: FLOMAX TAKE ONE CAPSULE BY MOUTH DAILY        Allergies:  Allergies  Allergen Reactions   Paxlovid [Nirmatrelvir-Ritonavir]     Family History: Family History  Problem Relation Age of Onset   Heart attack Mother 16   Hypertension Mother    Heart attack Father 17   Heart disease Father    Heart disease Brother    Healthy Son     Social History:  reports that he has never smoked. He has been exposed to tobacco smoke. He has never used smokeless tobacco. He reports current alcohol use. He reports that he does not use drugs.   Physical Exam: BP (!) 111/55   Pulse 61   Constitutional:  Well nourished. Alert and oriented, No acute distress. HEENT: [place] AT, moist mucus membranes.  Trachea midline, no masses. Cardiovascular: No clubbing, cyanosis, or edema. Respiratory: Normal respiratory effort, no increased work of breathing. Neurologic: Grossly intact, no focal deficits, moving all 4 extremities. Psychiatric: Normal mood and affect.   Laboratory Data: BMET    Component Value Date/Time   NA 137 07/11/2023 0553   NA 139 03/04/2022 0000   NA 138 03/15/2014 1005   K 3.9 07/11/2023 0553   K 4.4 03/15/2014 1005   CL 102 07/11/2023 0553   CL 108 (H) 03/15/2014 1005   CO2 28 07/11/2023 0553   CO2 30 03/15/2014 1005   GLUCOSE 108 (H) 07/11/2023 0553   GLUCOSE 110 (H) 03/15/2014 1005   BUN 11 07/11/2023 0553   BUN 17 03/04/2022 0000   BUN 12 03/15/2014 1005   CREATININE 1.36 (H) 07/11/2023 0553   CREATININE 1.76 (H) 02/20/2017 1045   CALCIUM 8.7 (L) 07/11/2023 0553   CALCIUM 9.1 03/15/2014 1005   GFRNONAA 53 (L) 07/11/2023 0553   GFRNONAA 38 (L) 02/20/2017 1045    CBC    Component Value  Date/Time   WBC 3.5 (L) 07/11/2023 0553   RBC 2.96 (L) 07/11/2023 0553   HGB 10.3 (L) 07/11/2023 0553   HGB 11.8 (L) 01/14/2018 1031   HCT 29.6 (L) 07/11/2023 0553   HCT 33.8 (L) 01/14/2018 1031   PLT 133 (L) 07/11/2023 0553   PLT 162 01/14/2018 1031   MCV 100.0 07/11/2023 0553   MCV 96 01/14/2018 1031   MCH 34.8 (H) 07/11/2023 0553   MCHC 34.8 07/11/2023 0553   RDW 12.6 07/11/2023 0553   RDW 13.2 01/14/2018 1031   LYMPHSABS 1.9 09/17/2022 1003   MONOABS 0.2 09/17/2022 1003   EOSABS 0.1 09/17/2022 1003   BASOSABS 0.0 09/17/2022 1003  I have reviewed the labs.   Pertinent imaging:  07/22/23 13:07  Scan Result 16 ml    Assessment & Plan:    1. BXO/penile adhesions - no issues at this time  2. BPH with obstruction  -Explained that his nocturia is due to him not sleeping with the CPAP machine and it is sequela of untreated sleep apnea -He does not find it bothersome, so we will continue using the urinal  3. UTI -Difficult to know if this was just an incidental finding during admission and he is chronically colonized versus an actual UTI as he still has weakness in his legs and states he needs follow-up regarding his back.  He states he was supposed to have an MRI of his lumbar spine, but they never called him to schedule this.  I advised him to call back to get follow-up on this and he states  he has appointment with them in the near future.  Return in about 1 year (around 07/21/2024) for I PSS, PVR .   Cloretta Ned  [facility] [address], [address] [place], [place] [address] [phone]

## 2023-07-20 NOTE — Transitions of Care (Post Inpatient/ED Visit) (Signed)
 07/20/2023  Name: Johnathan Arnold. MRN: [id number] DOB: [birth year]  Today's TOC FU Call Status: Today's TOC FU Call Status:: Successful TOC FU Call Completed TOC FU Call Complete Date: 07/20/23 Patient's Name and Date of Birth confirmed.  Transition Care Management Follow-up Telephone Call Date of Discharge: 07/17/23 Discharge Facility: Other ([facility]) Name of Other ([facility]) Discharge Facility: [facility] Type of Discharge: Inpatient Admission Primary Inpatient Discharge Diagnosis:: weakness How have you been since you were released from the hospital?: Better Any questions or concerns?: No  Items Reviewed: Did you receive and understand the discharge instructions provided?: Yes Medications obtained,verified, and reconciled?: Yes (Medications Reviewed) Any new allergies since your discharge?: No Dietary orders reviewed?: Yes Do you have support at home?: Yes People in Home: spouse  Medications Reviewed Today: Medications Reviewed Today     Reviewed by Darrall Ellison, LPN (Licensed Practical Nurse) on 07/20/23 at 1131  Med List Status: <None>   Medication Order Taking? Sig Documenting Provider Last Dose Status Informant  acetaminophen  (TYLENOL ) 500 MG tablet [id number]  Take 500 mg by mouth as needed. [provider]  Active Other, Pharmacy Records           Med Note [facility], [place] [facility]   Thu Jul 09, 2023  7:45 PM) prn  amLODipine  (NORVASC ) 10 MG tablet [id number]  Take 1 tablet (10 mg total) by mouth daily. Unk Garb, DO  Active   aspirin  EC 81 MG tablet [id number] No Take 1 tablet (81 mg total) by mouth at bedtime. Clearnce Curia, NP Past Month Active Other, Pharmacy Records  carbidopa -levodopa  (SINEMET  CR) 50-200 MG tablet [id number] No TAKE 1 TABLET BY MOUTH AT BEDTIME  Patient not taking: Reported on 07/09/2023   Tat, Von Grumbling, DO Not Taking Active Other, Pharmacy Records           Med Note [facility] A   Thu Jul 09, 2023  7:42 PM) Patient has not  picked up this strength since May of 2024  carbidopa -levodopa  (SINEMET  IR) 25-100 MG tablet [id number] No TAKE 2 TABLETS BY MOUTH AT 8AM; TAKE 2 TABLETS BY MOUTH AT 11AM; TAKE 2 TABLETS BY MOUTH AT 2PM.; TAKE 1 TABLET BY MOUTH AT 5PM. Shirline Dover, DO Past Month Active Other, Pharmacy Records  docusate sodium  (COLACE) 100 MG capsule [id number]  Take 1 capsule (100 mg total) by mouth 2 (two) times daily. Unk Garb, DO  Active   fluticasone [facility]) 50 MCG/ACT nasal spray [id number]  Place 1 spray into both nostrils daily. [provider]  Active Other, Pharmacy Records           Med Note [facility], [place] [facility]   Thu Jul 09, 2023  7:45 PM) prn  furosemide  (LASIX ) 20 MG tablet [id number] No Take 1 tablet (20 mg total) by mouth daily as needed (for swelling). West, Katlyn D, NP Taking Active Other, Pharmacy Records           Med Note [facility], [place] [facility]   Thu Jul 09, 2023  7:43 PM) prn  hydrALAZINE  (APRESOLINE ) 50 MG tablet [id number]  Take 1 tablet (50 mg total) by mouth every 6 (six) hours as needed (SBP >170 or DBP >100). Unk Garb, DO  Active   isosorbide  mononitrate (IMDUR ) 60 MG 24 hr tablet [id number] No TAKE 1 TABLET BY MOUTH DAILY Wenona Bradly Sangiovanni, MD Past Month Active Other, Pharmacy Records  melatonin 3 MG TABS tablet [id number] No Take 1.5 mg by mouth at bedtime as needed. [provider] Taking Active Other, Pharmacy Records           Med Note [facility], [place] [facility]   Thu Jul 09, 2023  7:46 PM) prn  Multiple Vitamin (MULTIVITAMIN) tablet [id number] No Take 1 tablet by mouth daily. [provider] Past Month Active Other, Pharmacy Records  nitroGLYCERIN  (NITROSTAT ) 0.4 MG SL tablet [id number] No Place 1 tablet (0.4 mg total) under the tongue every 5 (five) minutes as needed for chest pain. Clearnce Curia, NP Taking Active Other, Pharmacy Records           Med Note [facility], [place] [facility]   Thu Jul 09, 2023  7:44 PM) prn  olopatadine  (PATANOL) 0.1 % ophthalmic solution [id number]   Place 1 drop into both eyes 2 (two) times daily. Unk Garb, DO  Active   omeprazole  (PRILOSEC) 20 MG capsule [id number] No Take 1 capsule (20 mg total) by mouth every morning. Thersia Flax, MD Past Month Active Other, Pharmacy Records  ondansetron  (ZOFRAN ) 4 MG tablet [id number]  Take 1 tablet (4 mg total) by mouth every 6 (six) hours as needed for nausea or vomiting. Unk Garb, DO  Active   polyethylene glycol (MIRALAX  / GLYCOLAX ) packet [id number] No Take 17 g by mouth daily as needed. [provider] Taking Active Other, Pharmacy Records           Med Note [facility], [place] [facility]   Thu Jul 09, 2023  7:46 PM) prn  ranolazine  (RANEXA ) 1000 MG SR tablet [id number] No TAKE 1 TABLET BY MOUTH 2 TIMES A DAY  Patient not taking: Reported on 07/09/2023   Wenona Brindy Higginbotham, MD Not Taking Active Other, Pharmacy Records  rosuvastatin  (CRESTOR ) 10 MG tablet [id number] No TAKE 1 TABLET BY MOUTH DAILY Wenona Barrett Holthaus, MD Past Month Active Other, Pharmacy Records  senna (SENOKOT) 8.6 MG TABS tablet [id number]  Take 2 tablets (17.2 mg total) by mouth at bedtime. Unk Garb, DO  Active   tamsulosin  (FLOMAX ) 0.4 MG CAPS capsule [id number] No TAKE ONE CAPSULE BY MOUTH DAILY McGowan, Danne Dustman, PA-C Past Month Active Other, Pharmacy Records            Home Care and Equipment/Supplies: Were Home Health Services Ordered?: Yes Name of Home Health Agency:: unknown Has Agency set up a time to come to your home?: No Any new equipment or medical supplies ordered?: NA  Functional Questionnaire: Do you need assistance with bathing/showering or dressing?: No Do you need assistance with meal preparation?: No Do you need assistance with eating?: No Do you have difficulty maintaining continence: No Do you need assistance with getting out of bed/getting out of a chair/moving?: No Do you have difficulty managing or taking your medications?: No  Follow up appointments reviewed: PCP Follow-up appointment  confirmed?: Yes Date of PCP follow-up appointment?: 07/29/23 Follow-up Provider: [facility] Follow-up appointment confirmed?: NA Do you need transportation to your follow-up appointment?: No Do you understand care options if your condition(s) worsen?: Yes-patient verbalized understanding    SIGNATURE Darrall Ellison, LPN [facility] Nurse Health Advisor Direct Dial [phone]

## 2023-07-21 ENCOUNTER — Ambulatory Visit: Payer: Medicare Other | Admitting: Physical Therapy

## 2023-07-21 ENCOUNTER — Telehealth: Payer: Self-pay

## 2023-07-21 NOTE — Telephone Encounter (Signed)
Verbal orders given

## 2023-07-21 NOTE — Telephone Encounter (Signed)
Copied from CRM [id number]. Topic: Clinical - Home Health Verbal Orders >> Jul 21, 2023  3:34 PM Fredrich Romans wrote: Caller/Agency: [facility] Callback Number: [phone] Service Requested: Skilled Nursing,(1wk1,1q wk 4x2),  PT1(wk 1 eval),   OT(1wk1 eval)  Any new concerns about the patient? No

## 2023-07-22 ENCOUNTER — Ambulatory Visit (INDEPENDENT_AMBULATORY_CARE_PROVIDER_SITE_OTHER): Payer: Medicare Other | Admitting: Urology

## 2023-07-22 ENCOUNTER — Encounter: Payer: Self-pay | Admitting: Urology

## 2023-07-22 VITALS — BP 111/55 | HR 61

## 2023-07-22 DIAGNOSIS — N138 Other obstructive and reflux uropathy: Secondary | ICD-10-CM

## 2023-07-22 DIAGNOSIS — N401 Enlarged prostate with lower urinary tract symptoms: Secondary | ICD-10-CM

## 2023-07-22 DIAGNOSIS — N480 Leukoplakia of penis: Secondary | ICD-10-CM

## 2023-07-22 DIAGNOSIS — R351 Nocturia: Secondary | ICD-10-CM | POA: Diagnosis not present

## 2023-07-22 LAB — BLADDER SCAN AMB NON-IMAGING: Scan Result: 16

## 2023-07-22 NOTE — Progress Notes (Signed)
In and Out Catheterization  Patient is present today for a I & O catheterization due to contaminated UA. Patient was cleaned and prepped in a sterile fashion with betadine . A 14FR cath was inserted no complications were noted , 40 ml of urine return was noted, urine was yellow  in color. A clean urine sample was collected for UA/CUX. Bladder was drained  And catheter was removed with out difficulty.    Performed by: Vela Prose

## 2023-07-26 ENCOUNTER — Inpatient Hospital Stay
Admission: EM | Admit: 2023-07-26 | Discharge: 2023-07-31 | DRG: 178 | Disposition: A | Discharge status: Home / Self Care | Payer: Medicare Other | Attending: Internal Medicine | Admitting: Internal Medicine

## 2023-07-26 ENCOUNTER — Other Ambulatory Visit: Payer: Self-pay

## 2023-07-26 ENCOUNTER — Emergency Department: Payer: Medicare Other

## 2023-07-26 DIAGNOSIS — R636 Underweight: Secondary | ICD-10-CM | POA: Diagnosis present

## 2023-07-26 DIAGNOSIS — D61818 Other pancytopenia: Secondary | ICD-10-CM | POA: Diagnosis present

## 2023-07-26 DIAGNOSIS — G9349 Other encephalopathy: Secondary | ICD-10-CM | POA: Diagnosis present

## 2023-07-26 DIAGNOSIS — Z6826 Body mass index (BMI) 26.0-26.9, adult: Secondary | ICD-10-CM | POA: Diagnosis not present

## 2023-07-26 DIAGNOSIS — Z789 Other specified health status: Secondary | ICD-10-CM

## 2023-07-26 DIAGNOSIS — Z888 Allergy status to other drugs, medicaments and biological substances status: Secondary | ICD-10-CM | POA: Diagnosis not present

## 2023-07-26 DIAGNOSIS — Z951 Presence of aortocoronary bypass graft: Secondary | ICD-10-CM

## 2023-07-26 DIAGNOSIS — K5792 Diverticulitis of intestine, part unspecified, without perforation or abscess without bleeding: Secondary | ICD-10-CM | POA: Diagnosis not present

## 2023-07-26 DIAGNOSIS — R918 Other nonspecific abnormal finding of lung field: Secondary | ICD-10-CM | POA: Diagnosis not present

## 2023-07-26 DIAGNOSIS — R001 Bradycardia, unspecified: Secondary | ICD-10-CM | POA: Diagnosis present

## 2023-07-26 DIAGNOSIS — Z8249 Family history of ischemic heart disease and other diseases of the circulatory system: Secondary | ICD-10-CM

## 2023-07-26 DIAGNOSIS — R4182 Altered mental status, unspecified: Secondary | ICD-10-CM | POA: Diagnosis not present

## 2023-07-26 DIAGNOSIS — G4733 Obstructive sleep apnea (adult) (pediatric): Secondary | ICD-10-CM | POA: Diagnosis present

## 2023-07-26 DIAGNOSIS — J121 Respiratory syncytial virus pneumonia: Principal | ICD-10-CM

## 2023-07-26 DIAGNOSIS — E1122 Type 2 diabetes mellitus with diabetic chronic kidney disease: Secondary | ICD-10-CM | POA: Diagnosis not present

## 2023-07-26 DIAGNOSIS — E781 Pure hyperglyceridemia: Secondary | ICD-10-CM | POA: Diagnosis present

## 2023-07-26 DIAGNOSIS — E041 Nontoxic single thyroid nodule: Secondary | ICD-10-CM | POA: Diagnosis not present

## 2023-07-26 DIAGNOSIS — G20A1 Parkinson's disease without dyskinesia, without mention of fluctuations: Secondary | ICD-10-CM | POA: Diagnosis present

## 2023-07-26 DIAGNOSIS — Z7982 Long term (current) use of aspirin: Secondary | ICD-10-CM

## 2023-07-26 DIAGNOSIS — R531 Weakness: Secondary | ICD-10-CM | POA: Diagnosis not present

## 2023-07-26 DIAGNOSIS — K5732 Diverticulitis of large intestine without perforation or abscess without bleeding: Secondary | ICD-10-CM | POA: Diagnosis present

## 2023-07-26 DIAGNOSIS — I129 Hypertensive chronic kidney disease with stage 1 through stage 4 chronic kidney disease, or unspecified chronic kidney disease: Secondary | ICD-10-CM | POA: Diagnosis not present

## 2023-07-26 DIAGNOSIS — Z79899 Other long term (current) drug therapy: Secondary | ICD-10-CM

## 2023-07-26 DIAGNOSIS — I10 Essential (primary) hypertension: Secondary | ICD-10-CM | POA: Diagnosis not present

## 2023-07-26 DIAGNOSIS — J90 Pleural effusion, not elsewhere classified: Secondary | ICD-10-CM | POA: Diagnosis not present

## 2023-07-26 DIAGNOSIS — N1831 Chronic kidney disease, stage 3a: Secondary | ICD-10-CM | POA: Diagnosis present

## 2023-07-26 DIAGNOSIS — B338 Other specified viral diseases: Secondary | ICD-10-CM

## 2023-07-26 DIAGNOSIS — I2510 Atherosclerotic heart disease of native coronary artery without angina pectoris: Secondary | ICD-10-CM | POA: Diagnosis present

## 2023-07-26 DIAGNOSIS — J690 Pneumonitis due to inhalation of food and vomit: Principal | ICD-10-CM | POA: Diagnosis present

## 2023-07-26 DIAGNOSIS — K8020 Calculus of gallbladder without cholecystitis without obstruction: Secondary | ICD-10-CM | POA: Diagnosis not present

## 2023-07-26 DIAGNOSIS — R590 Localized enlarged lymph nodes: Secondary | ICD-10-CM | POA: Diagnosis not present

## 2023-07-26 DIAGNOSIS — J181 Lobar pneumonia, unspecified organism: Secondary | ICD-10-CM | POA: Diagnosis not present

## 2023-07-26 DIAGNOSIS — G4730 Sleep apnea, unspecified: Secondary | ICD-10-CM | POA: Diagnosis present

## 2023-07-26 DIAGNOSIS — I700 Atherosclerosis of aorta: Secondary | ICD-10-CM | POA: Diagnosis not present

## 2023-07-26 DIAGNOSIS — I959 Hypotension, unspecified: Secondary | ICD-10-CM | POA: Diagnosis not present

## 2023-07-26 DIAGNOSIS — J189 Pneumonia, unspecified organism: Secondary | ICD-10-CM | POA: Diagnosis not present

## 2023-07-26 DIAGNOSIS — R109 Unspecified abdominal pain: Secondary | ICD-10-CM | POA: Diagnosis not present

## 2023-07-26 HISTORY — DX: Respiratory syncytial virus pneumonia: J12.1

## 2023-07-26 LAB — CBC WITH DIFFERENTIAL/PLATELET
Abs Immature Granulocytes: 0.01 10*3/uL (ref 0.00–0.07)
Basophils Absolute: 0 10*3/uL (ref 0.0–0.1)
Basophils Relative: 0 %
Eosinophils Absolute: 0 10*3/uL (ref 0.0–0.5)
Eosinophils Relative: 0 %
HCT: 36 % — ABNORMAL LOW (ref 39.0–52.0)
Hemoglobin: 12.5 g/dL — ABNORMAL LOW (ref 13.0–17.0)
Immature Granulocytes: 0 %
Lymphocytes Relative: 22 %
Lymphs Abs: 0.8 10*3/uL (ref 0.7–4.0)
MCH: 34.4 pg — ABNORMAL HIGH (ref 26.0–34.0)
MCHC: 34.7 g/dL (ref 30.0–36.0)
MCV: 99.2 fL (ref 80.0–100.0)
Monocytes Absolute: 0.2 10*3/uL (ref 0.1–1.0)
Monocytes Relative: 5 %
Neutro Abs: 2.5 10*3/uL (ref 1.7–7.7)
Neutrophils Relative %: 73 %
Platelets: 135 10*3/uL — ABNORMAL LOW (ref 150–400)
RBC: 3.63 MIL/uL — ABNORMAL LOW (ref 4.22–5.81)
RDW: 12.4 % (ref 11.5–15.5)
WBC: 3.5 10*3/uL — ABNORMAL LOW (ref 4.0–10.5)
nRBC: 0 % (ref 0.0–0.2)

## 2023-07-26 LAB — URINALYSIS, W/ REFLEX TO CULTURE (INFECTION SUSPECTED)
Bacteria, UA: NONE SEEN
Bilirubin Urine: NEGATIVE
Glucose, UA: NEGATIVE mg/dL
Hgb urine dipstick: NEGATIVE
Ketones, ur: 20 mg/dL — AB
Leukocytes,Ua: NEGATIVE
Nitrite: NEGATIVE
Protein, ur: >=300 mg/dL — AB
Specific Gravity, Urine: 1.029 (ref 1.005–1.030)
Squamous Epithelial / HPF: 0 /[HPF] (ref 0–5)
pH: 5 (ref 5.0–8.0)

## 2023-07-26 LAB — COMPREHENSIVE METABOLIC PANEL
ALT: 22 U/L (ref 0–44)
AST: 30 U/L (ref 15–41)
Albumin: 3.9 g/dL (ref 3.5–5.0)
Alkaline Phosphatase: 40 U/L (ref 38–126)
Anion gap: 12 (ref 5–15)
BUN: 19 mg/dL (ref 8–23)
CO2: 24 mmol/L (ref 22–32)
Calcium: 9.4 mg/dL (ref 8.9–10.3)
Chloride: 99 mmol/L (ref 98–111)
Creatinine, Ser: 1.51 mg/dL — ABNORMAL HIGH (ref 0.61–1.24)
GFR, Estimated: 47 mL/min — ABNORMAL LOW (ref >60)
Glucose, Bld: 93 mg/dL (ref 70–99)
Potassium: 4.1 mmol/L (ref 3.5–5.1)
Sodium: 135 mmol/L (ref 135–145)
Total Bilirubin: 1.5 mg/dL — ABNORMAL HIGH (ref 0.0–1.2)
Total Protein: 6.9 g/dL (ref 6.5–8.1)

## 2023-07-26 LAB — TROPONIN I (HIGH SENSITIVITY)
Troponin I (High Sensitivity): 27 ng/L — ABNORMAL HIGH (ref <18)
Troponin I (High Sensitivity): 22 ng/L — ABNORMAL HIGH (ref <18)

## 2023-07-26 LAB — RESP PANEL BY RT-PCR (RSV, FLU A&B, COVID)  RVPGX2
Influenza A by PCR: NEGATIVE
Influenza B by PCR: NEGATIVE
Resp Syncytial Virus by PCR: POSITIVE — AB
SARS Coronavirus 2 by RT PCR: NEGATIVE

## 2023-07-26 LAB — CK: Total CK: 317 U/L (ref 49–397)

## 2023-07-26 LAB — LACTIC ACID, PLASMA
Lactic Acid, Venous: 1.1 mmol/L (ref 0.5–1.9)
Lactic Acid, Venous: 0.9 mmol/L (ref 0.5–1.9)

## 2023-07-26 MED ORDER — ONDANSETRON HCL 4 MG/2ML IJ SOLN: 4.0000 mg | Freq: Four times a day (QID) | INTRAMUSCULAR | Status: DC | PRN

## 2023-07-26 MED ORDER — LACTATED RINGERS IV BOLUS
1000.0000 mL | Freq: Once | INTRAVENOUS | Status: AC
Start: 2023-07-26 — End: 2023-07-26
  Administered 2023-07-26: 1000 mL via INTRAVENOUS

## 2023-07-26 MED ORDER — ENOXAPARIN SODIUM 40 MG/0.4ML IJ SOSY
40.0000 mg | PREFILLED_SYRINGE | INTRAMUSCULAR | Status: DC
  Administered 2023-07-26 – 2023-07-30 (×5): 40 mg via SUBCUTANEOUS
  Filled 2023-07-26 (×5): qty 0.4

## 2023-07-26 MED ORDER — METRONIDAZOLE 500 MG/100ML IV SOLN
500.0000 mg | Freq: Once | INTRAVENOUS | Status: DC
  Filled 2023-07-26: qty 100

## 2023-07-26 MED ORDER — SODIUM CHLORIDE 0.9 % IV SOLN: INTRAVENOUS | Status: AC

## 2023-07-26 MED ORDER — HYDRALAZINE HCL 20 MG/ML IJ SOLN
5.0000 mg | Freq: Once | INTRAMUSCULAR | Status: AC
  Administered 2023-07-26: 5 mg via INTRAVENOUS
  Filled 2023-07-26: qty 1

## 2023-07-26 MED ORDER — PIPERACILLIN-TAZOBACTAM 3.375 G IVPB
3.3750 g | Freq: Three times a day (TID) | INTRAVENOUS | Status: DC
  Administered 2023-07-26 – 2023-07-28 (×6): 3.375 g via INTRAVENOUS
  Filled 2023-07-26 (×6): qty 50

## 2023-07-26 MED ORDER — ONDANSETRON HCL 4 MG PO TABS: 4.0000 mg | ORAL_TABLET | Freq: Four times a day (QID) | ORAL | Status: DC | PRN

## 2023-07-26 MED ORDER — SODIUM CHLORIDE 0.9 % IV SOLN
1.0000 g | Freq: Once | INTRAVENOUS | Status: DC
  Filled 2023-07-26: qty 10

## 2023-07-26 MED ORDER — IOHEXOL 300 MG/ML  SOLN
75.0000 mL | Freq: Once | INTRAMUSCULAR | Status: AC | PRN
  Administered 2023-07-26: 75 mL via INTRAVENOUS

## 2023-07-26 NOTE — Assessment & Plan Note (Signed)
Noted generalized abdominal pain with mild diverticulitis on CT imaging IV Zosyn Antiemetics Pain control IV fluids Monitor

## 2023-07-26 NOTE — H&P (Signed)
History and Physical    Patient: Johnathan Arnold. WUX:[id number] DOB: [birth year] DOA: 07/26/2023 DOS: the patient was seen and examined on 07/26/2023 PCP: [name], MD  Patient coming from: Home  Chief Complaint:  Chief Complaint  Patient presents with   Weakness   HPI: Johnathan Arnold. is a 78 y.o. male with medical history significant of CAD status post CABG, type 2 diabetes, hyperlipidemia, hypertension, Parkinson's disease presenting with weakness, diverticulitis, RSV.  Limited history in the setting of generalized Parkinson disease and mild encephalopathy.  Per report, EMS called to home for worsening generalized weakness.  Patient is been sick x 5 days and unable to get out of bed.  Noted baseline general weakness in the setting of Parkinson disease.  Last admission January 23 through February 3 for similar issues with predominant concern for Parkinson's and medical noncompliance.  No reported fevers or chills.  Per report patient with?  Urinary symptoms.  Patient denies any active dysuria or increased urinary frequency.  Minimal abdominal pain. Presented to the ER Tmax 99.3, hemodynamically stable.  Satting well on room air.  White count 3.5, hemoglobin 12.5, platelets 135, lactate 0.9, troponin in the 20s, urinalysis not indicative of infection.  RSV positive.  Creatinine 1.5.  CT abdomen pelvis with noted acute diverticulitis of the sigmoid colon.  Masslike area in the right lower lobe concerning for potential neoplasm. Review of Systems: As mentioned in the history of present illness. All other systems reviewed and are negative. Past Medical History:  Diagnosis Date   3-vessel coronary artery disease    s/p  5 vessel CABG   Diabetes mellitus without complication (HCC)    History of cardiac catheterization 2011   [facility]   Hyperlipidemia    Hypertension    Hypertriglyceridemia    Parkinson's disease (HCC)    Pneumonia 12/28/2020   S/P CABG x 5 11-99   Vertigo    Past  Surgical History:  Procedure Laterality Date   CARDIAC CATHETERIZATION  05-19-2010   [facility]: Patent grafts. LIMA to LAD, SVG to D1, OM1 and RPDA   CORONARY ARTERY BYPASS GRAFT  03/1998   5 vessel, [facility]   RIGHT HEART CATH N/A 04/04/2019   Procedure: RIGHT HEART CATH;  Surgeon: Iran Ouch, MD;  Location: [facility] INVASIVE CV LAB;  Service: Cardiovascular;  Laterality: N/A;   RIGHT/LEFT HEART CATH AND CORONARY ANGIOGRAPHY N/A 02/01/2018   Procedure: RIGHT/LEFT HEART CATH AND CORONARY ANGIOGRAPHY;  Surgeon: Iran Ouch, MD;  Location: [facility] INVASIVE CV LAB;  Service: Cardiovascular;  Laterality: N/A;   Social History:  reports that he has never smoked. He has been exposed to tobacco smoke. He has never used smokeless tobacco. He reports current alcohol use. He reports that he does not use drugs.  Allergies  Allergen Reactions   Paxlovid [Nirmatrelvir-Ritonavir]     Family History  Problem Relation Age of Onset   Heart attack Mother 66   Hypertension Mother    Heart attack Father 71   Heart disease Father    Heart disease Brother    Healthy Son     Prior to Admission medications   Medication Sig Start Date End Date Taking? Authorizing Provider  acetaminophen (TYLENOL) 500 MG tablet Take 500 mg by mouth as needed.    [provider]  amLODipine (NORVASC) 10 MG tablet Take 1 tablet (10 mg total) by mouth daily. 07/14/23 08/13/23  Carollee Herter, DO  aspirin EC 81 MG tablet Take 1  tablet (81 mg total) by mouth at bedtime. 09/28/20   Alver Sorrow, NP  carbidopa-levodopa (SINEMET CR) 50-200 MG tablet TAKE 1 TABLET BY MOUTH AT BEDTIME Patient not taking: Reported on 07/09/2023 05/04/23   Tat, Octaviano Batty, DO  carbidopa-levodopa (SINEMET IR) 25-100 MG tablet TAKE 2 TABLETS BY MOUTH AT 8AM; TAKE 2 TABLETS BY MOUTH AT 11AM; TAKE 2 TABLETS BY MOUTH AT 2PM.; TAKE 1 TABLET BY MOUTH AT 5PM. 06/04/23   Tat, Octaviano Batty, DO  clonazePAM (KLONOPIN) 1 MG tablet Take 1 mg by mouth at  bedtime as needed. 07/17/23   [provider]  furosemide (LASIX) 20 MG tablet Take 1 tablet (20 mg total) by mouth daily as needed (for swelling). 03/02/23   Reather Littler D, NP  hydrALAZINE (APRESOLINE) 50 MG tablet Take 1 tablet (50 mg total) by mouth every 6 (six) hours as needed (SBP >170 or DBP >100). 07/13/23 08/12/23  Carollee Herter, DO  isosorbide mononitrate (IMDUR) 60 MG 24 hr tablet TAKE 1 TABLET BY MOUTH DAILY 04/06/23   Iran Ouch, MD  melatonin 3 MG TABS tablet Take 1.5 mg by mouth at bedtime as needed.    [provider]  Multiple Vitamin (MULTIVITAMIN) tablet Take 1 tablet by mouth daily.    [provider]  nitroGLYCERIN (NITROSTAT) 0.4 MG SL tablet Place 1 tablet (0.4 mg total) under the tongue every 5 (five) minutes as needed for chest pain. 09/28/20   Alver Sorrow, NP  omeprazole (PRILOSEC) 20 MG capsule Take 1 capsule (20 mg total) by mouth every morning. 05/22/23   [name], MD  ondansetron (ZOFRAN) 4 MG tablet Take 1 tablet (4 mg total) by mouth every 6 (six) hours as needed for nausea or vomiting. 07/13/23   Carollee Herter, DO  polyethylene glycol [facility] / GLYCOLAX) packet Take 17 g by mouth daily as needed.    [provider]  ranolazine (RANEXA) 1000 MG SR tablet TAKE 1 TABLET BY MOUTH 2 TIMES A DAY Patient not taking: Reported on 07/09/2023 03/05/23   Iran Ouch, MD  rosuvastatin (CRESTOR) 10 MG tablet TAKE 1 TABLET BY MOUTH DAILY 06/04/23   Iran Ouch, MD  tamsulosin (FLOMAX) 0.4 MG CAPS capsule TAKE ONE CAPSULE BY MOUTH DAILY 08/14/22   Harle Battiest, PA-C    Physical Exam: Vitals:   07/26/23 0839 07/26/23 0845 07/26/23 0938  BP:  (!) 179/45 (!) 175/52  Pulse: (!) 56  68  Resp: 20  19  Temp:  99.3 F (37.4 C)   TempSrc:  Rectal   SpO2: 96%  98%  Weight: 74 kg    Height: 5\' 6"  (1.676 m)     Physical Exam Constitutional:      Appearance: He is normal weight.     Comments: Underweight  Mildly lethargic    HENT:     Head: Normocephalic and atraumatic.     Nose: Nose normal.     Mouth/Throat:     Mouth: Mucous membranes are dry.  Eyes:     Pupils: Pupils are equal, round, and reactive to light.  Cardiovascular:     Rate and Rhythm: Normal rate and regular rhythm.  Pulmonary:     Effort: Pulmonary effort is normal.  Abdominal:     General: Bowel sounds are normal.  Musculoskeletal:     Comments: + generalized weakness   Neurological:     Comments: + generalized weakness    Psychiatric:  Mood and Affect: Mood normal.     Data Reviewed:  There are no new results to review at this time.  CT ABDOMEN PELVIS W CONTRAST CLINICAL DATA:  77 year old male with history of left lower quadrant abdominal pain. Generalized weakness.  EXAM: CT ABDOMEN AND PELVIS WITH CONTRAST  TECHNIQUE: Multidetector CT imaging of the abdomen and pelvis was performed using the standard protocol following bolus administration of intravenous contrast.  RADIATION DOSE REDUCTION: This exam was performed according to the departmental dose-optimization program which includes automated exposure control, adjustment of the mA and/or kV according to patient size and/or use of iterative reconstruction technique.  CONTRAST:  75mL OMNIPAQUE IOHEXOL 300 MG/ML  SOLN  COMPARISON:  CT of the abdomen and pelvis 11/21/2020.  FINDINGS: Lower chest: Mass-like area in the right lower lobe (axial image 4 of series 4) measuring 3.2 x 2.4 cm with some surrounding ground-glass attenuation and septal thickening. Small bilateral pleural effusions lying dependently. Atherosclerotic calcifications are noted in the left anterior descending and right coronary arteries. Status post median sternotomy.  Hepatobiliary: No suspicious cystic or solid hepatic lesions. No intra or extrahepatic biliary ductal dilatation. Tiny noncalcified gallstones are noted lying dependently in the gallbladder. Gallbladder is moderately  distended. No definite pericholecystic fluid or surrounding inflammatory changes.  Pancreas: No pancreatic mass. No pancreatic ductal dilatation. No pancreatic or peripancreatic fluid collections or inflammatory changes.  Spleen: Unremarkable.  Adrenals/Urinary Tract: Tiny subcentimeter low-attenuation lesions in the right kidney, too small to definitively characterize, but statistically likely tiny cysts (no imaging follow-up recommended). Left kidney and bilateral adrenal glands are otherwise normal in appearance. No hydroureteronephrosis. Urinary bladder is unremarkable in appearance.  Stomach/Bowel: The appearance of the stomach is normal. No pathologic dilatation of small bowel or colon. Numerous colonic diverticuli are noted. Trace volume of fluid and minimal fat stranding adjacent to diverticuli in the region of the sigmoid colon, which could indicate very mild acute diverticulitis. No definite diverticular abscess or signs of frank perforation are noted at this time. Normal appendix.  Vascular/Lymphatic: Atherosclerosis in the abdominal aorta and pelvic vasculature, without evidence of aneurysm or dissection. No lymphadenopathy noted in the abdomen or pelvis.  Reproductive: Prostate gland and seminal vesicles are unremarkable in appearance.  Other: Trace volume of ascites.  No pneumoperitoneum.  Musculoskeletal: There are no aggressive appearing lytic or blastic lesions noted in the visualized portions of the skeleton.  IMPRESSION: 1. There are imaging findings which could suggest very mild acute diverticulitis in the sigmoid colon. No diverticular abscess or signs of frank perforation are noted at this time. 2. Mass-like area in the right lower lobe concerning for potential neoplasm. Further evaluation with dedicated contrast-enhanced chest CT should be considered in the near future to better evaluate these findings. 3. Small bilateral pleural effusions lying  dependently. 4. Cholelithiasis without evidence of acute cholecystitis. 5. Aortic atherosclerosis, in addition to at least 2 vessel coronary artery disease.  Aortic Atherosclerosis (ICD10-I70.0).  Electronically Signed   By: Trudie Reed M.D.   On: 07/26/2023 10:34 DG Chest 2 View CLINICAL DATA:  78 year old male with history of generalized weakness and altered mental status.  EXAM: CHEST - 2 VIEW  COMPARISON:  Chest x-ray 07/02/2023.  FINDINGS: Lung volumes are normal. No consolidative airspace disease. No pleural effusions. No pneumothorax. No pulmonary nodule or mass noted. Pulmonary vasculature and the cardiomediastinal silhouette are within normal limits. Atherosclerotic calcifications are noted in the thoracic aorta. Status post median sternotomy for CABG.  IMPRESSION: 1.  No  radiographic evidence of acute cardiopulmonary disease. 2. Aortic atherosclerosis.  Electronically Signed   By: Trudie Reed M.D.   On: 07/26/2023 09:39  Lab Results  Component Value Date   WBC 3.5 (L) 07/26/2023   HGB 12.5 (L) 07/26/2023   HCT 36.0 (L) 07/26/2023   MCV 99.2 07/26/2023   PLT 135 (L) 07/26/2023   Last metabolic panel Lab Results  Component Value Date   GLUCOSE 93 07/26/2023   NA 135 07/26/2023   K 4.1 07/26/2023   CL 99 07/26/2023   CO2 24 07/26/2023   BUN 19 07/26/2023   CREATININE 1.51 (H) 07/26/2023   GFRNONAA 47 (L) 07/26/2023   CALCIUM 9.4 07/26/2023   PHOS 4.3 11/23/2020   PROT 6.9 07/26/2023   ALBUMIN 3.9 07/26/2023   LABGLOB 2.0 (L) 02/07/2021   AGRATIO 2.0 (H) 02/07/2021   BILITOT 1.5 (H) 07/26/2023   ALKPHOS 40 07/26/2023   AST 30 07/26/2023   ALT 22 07/26/2023   ANIONGAP 12 07/26/2023    Assessment and Plan: * Diverticulitis Noted generalized abdominal pain with mild diverticulitis on CT imaging IV Zosyn Antiemetics Pain control IV fluids Monitor  CKD stage 3a, GFR 45-59 ml/min (HCC) Cr 1.5 w/ GFR in 40s  Appears near baseline   Monitor   Essential hypertension BP stable  Titrate home regimen    RSV (respiratory syncytial virus pneumonia) RSV positive without overt pneumonia on imaging or hypoxia Supportive care Monitor  Generalized weakness Decompensated generalized weakness in the setting of diverticulitis, RSV, baseline Parkinson's disease Mild overlapping encephalopathy-toxic metabolic Unclear general baseline IV fluid hydration Continue home Parkinson's regimen PT OT evaluation For precautions Monitor  3-vessel coronary artery disease Baseline CAD s/p CABG  No active CP  Cont home regimen  Monitor   Sleep apnea in adult OSA      Advance Care Planning:   Code Status: Full Code   Consults: None   Family Communication: No family at the bedside   Severity of Illness: The appropriate patient status for this patient is INPATIENT. Inpatient status is judged to be reasonable and necessary in order to provide the required intensity of service to ensure the patient's safety. The patient's presenting symptoms, physical exam findings, and initial radiographic and laboratory data in the context of their chronic comorbidities is felt to place them at high risk for further clinical deterioration. Furthermore, it is not anticipated that the patient will be medically stable for discharge from the hospital within 2 midnights of admission.   * I certify that at the point of admission it is my clinical judgment that the patient will require inpatient hospital care spanning beyond 2 midnights from the point of admission due to high intensity of service, high risk for further deterioration and high frequency of surveillance required.*  Author: Floydene Flock, MD 07/26/2023 1:23 PM  For on call review [email or web address].

## 2023-07-26 NOTE — ED Provider Notes (Signed)
[facility] Provider Note    Event Date/Time   First MD Initiated Contact with Patient 07/26/23 [phone]     (approximate)   History   Weakness   HPI  Johnathan Arnold. is a 78 y.o. male with history of CAD, hypertension, CKD, Parkinson's, here with generalized weakness.  Patient states that he does have abdominal pain, cough.  Denies any chest pain or shortness of breath.  No pain anywhere else, he denies any trauma or falls.  No headaches.  Per EMS they found him lying in his bed, covered in his urine.  Only other person in the house was his wife who has history of a stroke.  He was ANO x 3 for them.  Wife does not appear to be able to take care of patient at home.     Physical Exam   Triage Vital Signs: ED Triage Vitals  Encounter Vitals Group     BP 07/26/23 0845 (!) 179/45     Systolic BP Percentile --      Diastolic BP Percentile --      Pulse Rate 07/26/23 0839 (!) 56     Resp 07/26/23 0839 20     Temp 07/26/23 0845 99.3 F (37.4 C)     Temp Source 07/26/23 0845 Rectal     SpO2 07/26/23 0839 96 %     Weight 07/26/23 0839 163 lb 2.3 oz (74 kg)     Height 07/26/23 0839 5\' 6"  (1.676 m)     Head Circumference --      Peak Flow --      Pain Score --      Pain Loc --      Pain Education --      Exclude from Growth Chart --     Most recent vital signs: Vitals:   07/26/23 0845 07/26/23 0938  BP: (!) 179/45 (!) 175/52  Pulse:  68  Resp:  19  Temp: 99.3 F (37.4 C)   SpO2:  98%     General: Awake, no distress.  ANO x 3, knows the month. CV:  Good peripheral perfusion.  Resp:  Normal effort.  Clear Abd:  No distention.  Soft, tender at the left lower quadrant without guarding or rebound Other:  No CVA tenderness, no obvious sacral ulcers, he does have stool in his underwear, not melanotic or bloody.  No midline spinal tenderness, no palpable skull deformities or tenderness, no tenderness to his extremities.   ED Results /  Procedures / Treatments   Labs (all labs ordered are listed, but only abnormal results are displayed) Labs Reviewed  RESP PANEL BY RT-PCR (RSV, FLU A&B, COVID)  RVPGX2 - Abnormal; Notable for the following components:      Result Value   Resp Syncytial Virus by PCR POSITIVE (*)    All other components within normal limits  COMPREHENSIVE METABOLIC PANEL - Abnormal; Notable for the following components:   Creatinine, Ser 1.51 (*)    Total Bilirubin 1.5 (*)    GFR, Estimated 47 (*)    All other components within normal limits  CBC WITH DIFFERENTIAL/PLATELET - Abnormal; Notable for the following components:   WBC 3.5 (*)    RBC 3.63 (*)    Hemoglobin 12.5 (*)    HCT 36.0 (*)    MCH 34.4 (*)    Platelets 135 (*)    All other components within normal limits  URINALYSIS, W/ REFLEX TO CULTURE (INFECTION SUSPECTED) -  Abnormal; Notable for the following components:   Color, Urine AMBER (*)    APPearance CLEAR (*)    Ketones, ur 20 (*)    Protein, ur >=300 (*)    All other components within normal limits  TROPONIN I (HIGH SENSITIVITY) - Abnormal; Notable for the following components:   Troponin I (High Sensitivity) 27 (*)    All other components within normal limits  LACTIC ACID, PLASMA  CK  LACTIC ACID, PLASMA  TROPONIN I (HIGH SENSITIVITY)     EKG  EKG shows sinus bradycardia, normal QRS, normal QTc, T wave flattening to 1, aVL, V6, no ischemic ST elevation, not significant by the prior   RADIOLOGY Chest x-ray on my interpretation without focal consolidation   PROCEDURES:  Critical Care performed: No  Procedures   MEDICATIONS ORDERED IN ED: Medications  cefTRIAXone (ROCEPHIN) 1 g in sodium chloride 0.9 % 100 mL IVPB (has no administration in time range)  metroNIDAZOLE (FLAGYL) IVPB 500 mg (has no administration in time range)  lactated ringers bolus 1,000 mL (0 mLs Intravenous Stopped 07/26/23 0939)  iohexol (OMNIPAQUE) 300 MG/ML solution 75 mL (75 mLs Intravenous  Contrast Given 07/26/23 1001)     IMPRESSION / MDM / ASSESSMENT AND PLAN / ED COURSE  I reviewed the triage vital signs and the nursing notes.                              Differential diagnosis includes, but is not limited to, UTI, pneumonia, RSV, influenza, COVID, dehydration, electrolyte derangements, diverticulitis, colitis, rhabdomyolysis, atypical ACS.  Will get labs, EKG, troponin, chest x-ray, CT abdomen pelvis, IV fluids.  He will likely need to be admitted or stay for TOC.  Patient's presentation is most consistent with acute presentation with potential threat to life or bodily function.  Independent review of labs and imaging below.  Given the diverticulitis, we will start him on ceftriaxone and Flagyl IV.  Given that he has multiple infections as well as not having a safe discharge since wife is unable to care for him at home, will have him admitted for further management.  Consult to hospitalist was agreeable with plan for admission and will evaluate the patient.  He is admitted.  Clinical Course as of 07/26/23 1131  Sun Jul 26, 2023  1610 DG Chest 2 View IMPRESSION: 1.  No radiographic evidence of acute cardiopulmonary disease. 2. Aortic atherosclerosis.   [TT]  1107 CT ABDOMEN PELVIS W CONTRAST IMPRESSION: 1. There are imaging findings which could suggest very mild acute diverticulitis in the sigmoid colon. No diverticular abscess or signs of frank perforation are noted at this time. 2. Mass-like area in the right lower lobe concerning for potential neoplasm. Further evaluation with dedicated contrast-enhanced chest CT should be considered in the near future to better evaluate these findings. 3. Small bilateral pleural effusions lying dependently. 4. Cholelithiasis without evidence of acute cholecystitis. 5. Aortic atherosclerosis, in addition to at least 2 vessel coronary artery disease.  Aortic Atherosclerosis (ICD10-I70.0).   [TT]  1107 DG Chest 2  View IMPRESSION: 1.  No radiographic evidence of acute cardiopulmonary disease. 2. Aortic atherosclerosis.   [TT]  1107 DG Chest 2 View [TT]    Clinical Course User Index [TT] Jodie Echevaria, Franchot Erichsen, MD     FINAL CLINICAL IMPRESSION(S) / ED DIAGNOSES   Final diagnoses:  Diverticulitis  RSV infection  Lung mass     Rx / DC Orders  ED Discharge Orders     None        Note:  This document was prepared using Dragon voice recognition software and may include unintentional dictation errors.    Claybon Jabs, MD 07/26/23 [phone]

## 2023-07-26 NOTE — ED Triage Notes (Signed)
pt to ED via [facility] from home for CC generalized weakness sick since 5 days, not OOB UTI symptoms oriented X3 (not to time) family poor historians. lives with wife who cannot help care for pt  EMS VS: 180/68, CBG 128, 35 etco2, HR 57, 95% RA  EDP at bedside

## 2023-07-26 NOTE — Assessment & Plan Note (Signed)
RSV positive without overt pneumonia on imaging or hypoxia Supportive care Monitor

## 2023-07-26 NOTE — ED Notes (Signed)
1 set blood cultures drawn and sent at this time.

## 2023-07-26 NOTE — Assessment & Plan Note (Signed)
 OSA

## 2023-07-26 NOTE — Consult Note (Signed)
Pharmacy Antibiotic Note  Johnathan Reser. is a 78 y.o. male admitted on 07/26/2023 with diverticulitis. Pharmacy has been consulted for Zosyn dosing.  Today, 07/26/2023 Day 1 of Zosyn  WBC 3.5 Afebrile  Scr 1.51 today with estimated CrCl of 36.4 mL/min  02/16 CT abdomen/pelvis There are imaging findings which could suggest very mild acute diverticulitis in the sigmoid colon. No diverticular abscess or signs of frank perforation are noted at this time. Plan: Start Zosyn 3.375 gm IV Q8H based on current renal function  Pharmacy will continue to monitor and dose adjust appropriately   Height: 5\' 6"  (167.6 cm) Weight: 74 kg (163 lb 2.3 oz) IBW/kg (Calculated) : 63.8  Temp (24hrs), Avg:99.3 F (37.4 C), Min:99.3 F (37.4 C), Max:99.3 F (37.4 C)  Recent Labs  Lab 07/26/23 0847  WBC 3.5*  CREATININE 1.51*  LATICACIDVEN 1.1    Estimated Creatinine Clearance: 36.4 mL/min (A) (by C-G formula based on SCr of 1.51 mg/dL (H)).    Allergies  Allergen Reactions   Paxlovid [Nirmatrelvir-Ritonavir]    Antimicrobials this admission: 02/16 Zosyn >>   Dose adjustments this admission:  Microbiology results: 02/16 Resp panel: (+) RSV 02/16 BCx: to be collected  Thank you for allowing pharmacy to be a part of this patient's care.  Littie Deeds, PharmD Pharmacy Resident  07/26/2023 11:53 AM

## 2023-07-26 NOTE — Assessment & Plan Note (Signed)
Decompensated generalized weakness in the setting of diverticulitis, RSV, baseline Parkinson's disease Mild overlapping encephalopathy-toxic metabolic Unclear general baseline IV fluid hydration Continue home Parkinson's regimen PT OT evaluation For precautions Monitor

## 2023-07-26 NOTE — Assessment & Plan Note (Signed)
 BP stable Titrate home regimen

## 2023-07-26 NOTE — Assessment & Plan Note (Signed)
Baseline CAD s/p CABG  No active CP  Cont home regimen  Monitor

## 2023-07-26 NOTE — Assessment & Plan Note (Signed)
Cr 1.5 w/ GFR in 40s  Appears near baseline  Monitor

## 2023-07-27 ENCOUNTER — Ambulatory Visit: Payer: Medicare Other | Admitting: Physical Therapy

## 2023-07-27 ENCOUNTER — Encounter: Payer: Self-pay | Admitting: Family Medicine

## 2023-07-27 ENCOUNTER — Inpatient Hospital Stay: Payer: Medicare Other

## 2023-07-27 ENCOUNTER — Other Ambulatory Visit: Payer: Self-pay

## 2023-07-27 DIAGNOSIS — K5792 Diverticulitis of intestine, part unspecified, without perforation or abscess without bleeding: Secondary | ICD-10-CM | POA: Diagnosis not present

## 2023-07-27 LAB — CBC
HCT: 31.8 % — ABNORMAL LOW (ref 39.0–52.0)
Hemoglobin: 11 g/dL — ABNORMAL LOW (ref 13.0–17.0)
MCH: 34.9 pg — ABNORMAL HIGH (ref 26.0–34.0)
MCHC: 34.6 g/dL (ref 30.0–36.0)
MCV: 101 fL — ABNORMAL HIGH (ref 80.0–100.0)
Platelets: 127 10*3/uL — ABNORMAL LOW (ref 150–400)
RBC: 3.15 MIL/uL — ABNORMAL LOW (ref 4.22–5.81)
RDW: 12.5 % (ref 11.5–15.5)
WBC: 2.9 10*3/uL — ABNORMAL LOW (ref 4.0–10.5)
nRBC: 0 % (ref 0.0–0.2)

## 2023-07-27 LAB — COMPREHENSIVE METABOLIC PANEL
ALT: 23 U/L (ref 0–44)
AST: 27 U/L (ref 15–41)
Albumin: 3.2 g/dL — ABNORMAL LOW (ref 3.5–5.0)
Alkaline Phosphatase: 34 U/L — ABNORMAL LOW (ref 38–126)
Anion gap: 13 (ref 5–15)
BUN: 22 mg/dL (ref 8–23)
CO2: 21 mmol/L — ABNORMAL LOW (ref 22–32)
Calcium: 8.4 mg/dL — ABNORMAL LOW (ref 8.9–10.3)
Chloride: 101 mmol/L (ref 98–111)
Creatinine, Ser: 1.5 mg/dL — ABNORMAL HIGH (ref 0.61–1.24)
GFR, Estimated: 47 mL/min — ABNORMAL LOW (ref >60)
Glucose, Bld: 89 mg/dL (ref 70–99)
Potassium: 3.8 mmol/L (ref 3.5–5.1)
Sodium: 135 mmol/L (ref 135–145)
Total Bilirubin: 1.1 mg/dL (ref 0.0–1.2)
Total Protein: 6 g/dL — ABNORMAL LOW (ref 6.5–8.1)

## 2023-07-27 MED ORDER — HYDRALAZINE HCL 50 MG PO TABS
50.0000 mg | ORAL_TABLET | Freq: Four times a day (QID) | ORAL | Status: DC | PRN
  Administered 2023-07-29 – 2023-07-30 (×3): 50 mg via ORAL
  Filled 2023-07-27 (×3): qty 1

## 2023-07-27 MED ORDER — CLONAZEPAM 1 MG PO TABS
1.0000 mg | ORAL_TABLET | Freq: Every evening | ORAL | Status: DC | PRN
  Administered 2023-07-28 – 2023-07-30 (×3): 1 mg via ORAL
  Filled 2023-07-27 (×3): qty 1

## 2023-07-27 MED ORDER — ISOSORBIDE MONONITRATE ER 30 MG PO TB24
60.0000 mg | ORAL_TABLET | Freq: Every day | ORAL | Status: DC
  Administered 2023-07-27 – 2023-07-31 (×5): 60 mg via ORAL
  Filled 2023-07-27: qty 1
  Filled 2023-07-27 (×4): qty 2

## 2023-07-27 MED ORDER — NITROGLYCERIN 0.4 MG SL SUBL: 0.4000 mg | SUBLINGUAL_TABLET | SUBLINGUAL | Status: DC | PRN

## 2023-07-27 MED ORDER — ROSUVASTATIN CALCIUM 10 MG PO TABS
10.0000 mg | ORAL_TABLET | Freq: Every day | ORAL | Status: DC
  Administered 2023-07-27 – 2023-07-31 (×5): 10 mg via ORAL
  Filled 2023-07-27 (×6): qty 1

## 2023-07-27 MED ORDER — CARBIDOPA-LEVODOPA 25-100 MG PO TABS
2.0000 | ORAL_TABLET | Freq: Four times a day (QID) | ORAL | Status: DC
  Administered 2023-07-27 – 2023-07-31 (×18): 2 via ORAL
  Filled 2023-07-27 (×20): qty 2

## 2023-07-27 MED ORDER — TAMSULOSIN HCL 0.4 MG PO CAPS
0.4000 mg | ORAL_CAPSULE | Freq: Every day | ORAL | Status: DC
  Administered 2023-07-27 – 2023-07-30 (×4): 0.4 mg via ORAL
  Filled 2023-07-27 (×4): qty 1

## 2023-07-27 MED ORDER — IOHEXOL 300 MG/ML  SOLN
75.0000 mL | Freq: Once | INTRAMUSCULAR | Status: AC | PRN
  Administered 2023-07-27: 75 mL via INTRAVENOUS

## 2023-07-27 MED ORDER — POLYETHYLENE GLYCOL 3350 17 G PO PACK: 17.0000 g | PACK | Freq: Every day | ORAL | Status: DC | PRN

## 2023-07-27 MED ORDER — ASPIRIN 81 MG PO TBEC
81.0000 mg | DELAYED_RELEASE_TABLET | Freq: Every day | ORAL | Status: DC
  Administered 2023-07-27 – 2023-07-30 (×4): 81 mg via ORAL
  Filled 2023-07-27 (×4): qty 1

## 2023-07-27 MED ORDER — PANTOPRAZOLE SODIUM 40 MG PO TBEC
40.0000 mg | DELAYED_RELEASE_TABLET | Freq: Every day | ORAL | Status: DC
  Administered 2023-07-27 – 2023-07-31 (×5): 40 mg via ORAL
  Filled 2023-07-27 (×5): qty 1

## 2023-07-27 NOTE — Progress Notes (Signed)
Progress Note   Patient: Johnathan Arnold. NWG:[id number] DOB: [birth year] DOA: 07/26/2023     1 DOS: the patient was seen and examined on 07/27/2023   Brief hospital course:  Johnathan Arnold. is a 78 y.o. male with medical history significant of CAD status post CABG, type 2 diabetes, hyperlipidemia, hypertension, Parkinson's disease presenting with weakness, diverticulitis, RSV.  Limited history in the setting of generalized Parkinson disease and mild encephalopathy.  Per report, EMS called to home for worsening generalized weakness.  Patient is been sick x 5 days and unable to get out of bed.  Noted baseline general weakness in the setting of Parkinson disease.  Last admission January 23 through February 3 for similar issues with predominant concern for Parkinson's and medical noncompliance.  No reported fevers or chills.  Per report patient with?  Urinary symptoms.  Patient denies any active dysuria or increased urinary frequency.  Minimal abdominal pain. Presented to the ER Tmax 99.3, hemodynamically stable.  Satting well on room air.  White count 3.5, hemoglobin 12.5, platelets 135, lactate 0.9, troponin in the 20s, urinalysis not indicative of infection.  RSV positive.  Creatinine 1.5.  CT abdomen pelvis with noted acute diverticulitis of the sigmoid colon.  Masslike area in the right lower lobe concerning for potential neoplasm.       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/min (HCC) Cr 1.5 w/ GFR in 40s  Appears near baseline  Monitor    Essential hypertension Continue Imdur and hydralazine     RSV (respiratory syncytial virus pneumonia) RSV positive without overt pneumonia on imaging or hypoxia Supportive care Monitor   Generalized weakness Decompensated generalized weakness in  the setting of diverticulitis, RSV, baseline Parkinson's disease Mild overlapping encephalopathy-which has improved, patient is back to his baseline Unclear general baseline IV fluid hydration Continue home Parkinson's regimen PT OT evaluation    3-vessel coronary artery disease Baseline CAD s/p CABG  No active CP  Cont home regimen  Monitor    Sleep apnea in adult OSA   Lung mass Patient had a CT scan of abdomen and pelvis which showed mass-like area in the right lower lobe concerning for potential neoplasm. Further evaluation with dedicated contrast-enhanced chest CT should be considered in the near future to better evaluate these findings.   Pancytopenia Most likely secondary to viral illness Monitor closely    Subjective: Feels better.  Physical Exam: Vitals:   07/27/23 1300 07/27/23 1308 07/27/23 1309 07/27/23 1309  BP: (!) 168/59   (!) 168/59  Pulse:   61 61  Resp:  20  20  Temp:      TempSrc:      SpO2:    96%  Weight:      Height:      Constitutional:      Appearance: He is normal weight.     Comments: Underweight  Mildly lethargic   HENT:     Head: Normocephalic and atraumatic.     Nose: Nose normal.     Mouth/Throat:     Mouth: Mucous membranes are dry.  Eyes:     Pupils: Pupils are equal, round, and reactive to light.  Cardiovascular:     Rate and Rhythm: Normal rate and regular rhythm.  Pulmonary:     Effort: Pulmonary effort is  normal.  Abdominal:     General: Bowel sounds are normal.  Musculoskeletal:     Comments: + generalized weakness   Neurological:     Comments: + generalized weakness    Psychiatric:        Mood and Affect: Mood normal.        Data Reviewed: Labs reviewed.  Pancytopenia There are no new results to review at this time.  Family Communication: Plan of care discussed with patient's wife at the bedside.  All questions and concerns have been addressed  Disposition: Status is: Inpatient Remains inpatient  appropriate because: Further evaluation of lung mass, PT evaluation  Planned Discharge Destination:  TBD    Time spent: 36 minutes  Author: Lucile Shutters, MD 07/27/2023 2:43 PM  For on call review [email or web address].

## 2023-07-27 NOTE — ED Notes (Signed)
This RN minimally assisted pt to bedside commode. Pt had large BM.

## 2023-07-27 NOTE — ED Notes (Signed)
Called dinning services about missing breakfast tray.

## 2023-07-27 NOTE — Evaluation (Signed)
Physical Therapy Evaluation Patient Details Name: Johnathan Arnold. MRN: [id number] DOB: 04-26-1946 Today's Date: 07/27/2023  History of Present Illness  78 y.o. male presenting with weakness, diverticulitis and RSV. PMHx: CAD/CABG, HTN, CKD stage IIIa, Parkinson's disease-BLE tremors.  Clinical Impression  Patient resting on stretcher upon arrival to room; supportive wife present at bedside. Patient sleeping, but easily awakens to voice and light touch.  Oriented to self, location and general situation; follows simple commands and eager for participation with session.  Slightly HOH, does often require repetition of verbal commands (and does better when can see face/lips). Generally weak and deconditioned throughout all extremities, but no focal weakness/asymmetry appreciated. Notable tremor (baseline) throughout R hemi-body, unchanged with this admission and does calm with WBing and functional activities. Currently requiring min assist for bed mobility; min assist for sit/stand, basic transfers and gait (20') with RW. Demonstrates forward flexed posture; fair/good position and use of RW; does maintain crouched posture with decreased heel strike/toe off and overall foot clearance throughout gait cycle. Limited balance reactions noted, but no overt buckling or LOB.  Of note, does demonstrate significant drop in BP from supine to sitting (see flowsheet for details); unable to capture standing, as patient eager to initiate movement.  Asymptomatic throughout, maintaining appropriate alertness, conversation throughout gait trial. Would benefit from skilled PT to address above deficits and promote optimal return to PLOF.; recommend post-acute PT follow up as indicated by interdisciplinary care team.          If plan is discharge home, recommend the following: Assistance with cooking/housework;Assist for transportation;Help with stairs or ramp for entrance;A lot of help with walking and/or transfers;A lot  of help with bathing/dressing/bathroom   Can travel by private vehicle   Yes    Equipment Recommendations None recommended by PT  Recommendations for Other Services       Functional Status Assessment Patient has had a recent decline in their functional status and demonstrates the ability to make significant improvements in function in a reasonable and predictable amount of time.     Precautions / Restrictions Precautions Precautions: Fall Restrictions Weight Bearing Restrictions Per Provider Order: No      Mobility  Bed Mobility Overal bed mobility: Needs Assistance Bed Mobility: Supine to Sit, Sit to Supine     Supine to sit: Min assist Sit to supine: Min assist   General bed mobility comments: Extra time, effort, and use of the bed rail    Transfers Overall transfer level: Needs assistance Equipment used: Rolling walker (2 wheels) Transfers: Sit to/from Stand Sit to Stand: Min assist                Ambulation/Gait Ambulation/Gait assistance: Min assist Gait Distance (Feet): 20 Feet Assistive device: Rolling walker (2 wheels)         General Gait Details: forward flexed posture; fair/good position and use of RW; does maintain crouched posture with decreased heel strike/toe off and overall foot clearance throughout gait cycle. Limited balance reactions noted, but no overt buckling or LOB.  Stairs            Wheelchair Mobility     Tilt Bed    Modified Rankin (Stroke Patients Only)       Balance Overall balance assessment: Needs assistance Sitting-balance support: No upper extremity supported, Feet supported Sitting balance-Leahy Scale: Fair     Standing balance support: Bilateral upper extremity supported Standing balance-Leahy Scale: Fair  Pertinent Vitals/Pain Pain Assessment Pain Assessment: No/denies pain    Home Living Family/patient expects to be discharged to:: Private  residence Living Arrangements: Spouse/significant other Available Help at Discharge: Family;Available 24 hours/day Type of Home: House Home Access: Stairs to enter Entrance Stairs-Rails: None Entrance Stairs-Number of Steps: 1 Alternate Level Stairs-Number of Steps: 15 Home Layout: Two level;Able to live on main level with bedroom/bathroom Home Equipment: Rolling Walker (2 wheels);Cane - quad;Shower seat;Hand held shower head      Prior Function Prior Level of Function : Independent/Modified Independent;History of Falls (last six months)             Mobility Comments: Mod Ind amb with a QC limited community distances, at least 5-6 falls in the last 6 months secondary to LOB, currently working with OPPT at [facility] ADLs Comments: Ind with ADLs     Extremity/Trunk Assessment   Upper Extremity Assessment Upper Extremity Assessment: Generalized weakness    Lower Extremity Assessment Lower Extremity Assessment: Generalized weakness (grossly at least 4-/5 throughout; no focal weakness appreciated)       Communication   Communication Communication: No apparent difficulties Factors Affecting Communication: Hearing impaired    Cognition Arousal: Alert Behavior During Therapy: WFL for tasks assessed/performed   PT - Cognitive impairments: No apparent impairments                                 Cueing       General Comments General comments (skin integrity, edema, etc.): BP 169/97 (98), other VS WNL, on RA    Exercises     Assessment/Plan    PT Assessment Patient needs continued PT services  PT Problem List Decreased strength;Decreased activity tolerance;Decreased balance;Decreased mobility;Decreased knowledge of use of DME       PT Treatment Interventions DME instruction;Gait training;Stair training;Functional mobility training;Therapeutic activities;Therapeutic exercise;Balance training;Patient/family education    PT Goals (Current goals can be found in  the Care Plan section)  Acute Rehab PT Goals Patient Stated Goal: to go back home and go back to PT with [name] PT Goal Formulation: With patient Time For Goal Achievement: 08/10/23 Potential to Achieve Goals: Good    Frequency Min 1X/week     Co-evaluation               AM-PAC PT "6 Clicks" Mobility  Outcome Measure Help needed turning from your back to your side while in a flat bed without using bedrails?: None Help needed moving from lying on your back to sitting on the side of a flat bed without using bedrails?: A Little Help needed moving to and from a bed to a chair (including a wheelchair)?: A Little Help needed standing up from a chair using your arms (e.g., wheelchair or bedside chair)?: A Little Help needed to walk in hospital room?: A Little Help needed climbing 3-5 steps with a railing? : A Lot 6 Click Score: 18    End of Session Equipment Utilized During Treatment: Gait belt Activity Tolerance: Patient tolerated treatment well Patient left: in bed;with call bell/phone within reach;with family/visitor present Nurse Communication: Mobility status PT Visit Diagnosis: Unsteadiness on feet (R26.81);History of falling (Z91.81);Difficulty in walking, not elsewhere classified (R26.2);Muscle weakness (generalized) (M62.81)    Time: 4540-9811 PT Time Calculation (min) (ACUTE ONLY): 31 min   Charges:   PT Evaluation $PT Eval Moderate Complexity: 1 Mod   PT General Charges $$ ACUTE PT VISIT: 1 Visit  Gwendlyn Hanback H. Manson Passey, PT, DPT, NCS 07/27/23, 2:06 PM [phone]

## 2023-07-27 NOTE — ED Notes (Signed)
Messaged Agbata, MD about patients hypertension through the night and this AM. No PRN orders at his time.

## 2023-07-27 NOTE — Evaluation (Signed)
Occupational Therapy Evaluation Patient Details Name: Johnathan Arnold. MRN: [id number] DOB: 08/08/1945 Today's Date: 07/27/2023   History of Present Illness   78 y.o. male presenting with weakness, diverticulitis and RSV. PMHx: CAD/CABG, HTN, CKD stage IIIa, Parkinson's disease-BLE tremors.     Clinical Impressions PTA, pt performs mobility with SPC (occasionally uses RW, hx of frequent falls, and performs ADLs mod independently. Pt currently requires minA for bed mobility (use of bed rails and assist for BLEs back to bed), performing sit<>stand transfers with minA first attempt, progressing to CGA, functional mobility in room with RW + CGA, and performs toileting tasks standing with minA for clothing management/urinal. Tremors noted in BUE, decreased FMC/GMC noted bilaterally with generalized weakness. Pt would benefit from skilled OT services to address noted impairments and functional limitations (see below for any additional details) in order to maximize safety and independence while minimizing falls risk and caregiver burden. Anticipate the need for follow up OT services upon acute hospital DC. Patient will benefit from continued inpatient follow up therapy, <3 hours/day      If plan is discharge home, recommend the following:   Assist for transportation;Assistance with cooking/housework;A little help with walking and/or transfers;A lot of help with bathing/dressing/bathroom;Help with stairs or ramp for entrance     Functional Status Assessment   Patient has had a recent decline in their functional status and demonstrates the ability to make significant improvements in function in a reasonable and predictable amount of time.     Equipment Recommendations   None recommended by OT      Precautions/Restrictions   Precautions Precautions: Fall Restrictions Weight Bearing Restrictions Per Provider Order: No     Mobility Bed Mobility Overal bed mobility: Needs  Assistance Bed Mobility: Supine to Sit, Sit to Supine     Supine to sit: Min assist Sit to supine: Min assist   General bed mobility comments: Extra time, effort, and use of the bed rail    Transfers Overall transfer level: Needs assistance Equipment used: Rolling walker (2 wheels) Transfers: Sit to/from Stand Sit to Stand: Min assist           General transfer comment: first attempt minA, x2 trials with CGA      Balance Overall balance assessment: Needs assistance Sitting-balance support: Feet supported Sitting balance-Leahy Scale: Fair     Standing balance support: Bilateral upper extremity supported, During functional activity, Reliant on assistive device for balance Standing balance-Leahy Scale: Fair Standing balance comment: steady in static standing, uses urinal in standing with CGA                           ADL either performed or assessed with clinical judgement   ADL Overall ADL's : Needs assistance/impaired     Grooming: Wash/dry hands;Standing Grooming Details (indicate cue type and reason): CGA for standing balance at sink Upper Body Bathing: Sitting;Set up   Lower Body Bathing: Sit to/from stand;Moderate assistance   Upper Body Dressing : Sitting;Set up   Lower Body Dressing: Moderate assistance   Toilet Transfer: BSC/3in1;Regular Toilet;Contact guard assist;Ambulation;Rolling walker (2 wheels) Toilet Transfer Details (indicate cue type and reason): clinical judgement Toileting- Clothing Manipulation and Hygiene: Minimal assistance;Sit to/from stand Toileting - Clothing Manipulation Details (indicate cue type and reason): minA to use urinal in standing     Functional mobility during ADLs: Contact guard assist;Minimal assistance;Rolling walker (2 wheels) General ADL Comments: pt declined performance, anticipate mod for LB, setup for  UB     Vision Baseline Vision/History: 1 Wears glasses              Pertinent Vitals/Pain Pain  Assessment Pain Assessment: No/denies pain     Extremity/Trunk Assessment Upper Extremity Assessment Upper Extremity Assessment: Generalized weakness;Right hand dominant (tremors due to PD)   Lower Extremity Assessment Lower Extremity Assessment: Defer to PT evaluation       Communication Communication Communication: No apparent difficulties Factors Affecting Communication: Hearing impaired   Cognition Arousal: Alert Behavior During Therapy: WFL for tasks assessed/performed Cognition: No apparent impairments                               Following commands: Intact (occasionally requires increased time to respond likely due to [facility])       Cueing  General Comments   Cueing Techniques: Verbal cues;Tactile cues              Home Living Family/patient expects to be discharged to:: Private residence Living Arrangements: Spouse/significant other Available Help at Discharge: Family;Available 24 hours/day Type of Home: House Home Access: Stairs to enter [employer] of Steps: 1 Entrance Stairs-Rails: None Home Layout: Two level;Able to live on main level with bedroom/bathroom Alternate Level Stairs-Number of Steps: 15 Alternate Level Stairs-Rails: Left;Right Bathroom Shower/Tub: Producer, television/film/video: Standard Bathroom Accessibility: Yes How Accessible: Accessible via walker Home Equipment: Agricultural consultant (2 wheels);Cane - quad;Shower seat;Hand held shower head          Prior Functioning/Environment Prior Level of Function : Independent/Modified Independent;History of Falls (last six months)             Mobility Comments: Mod Ind amb with a QC limited community distances, 10 falls in the last 6 months secondary to LOB, currently working with OPPT at [facility] ADLs Comments: Ind with ADLs    OT Problem List: Decreased strength;Decreased activity tolerance;Impaired balance (sitting and/or standing);Decreased knowledge of use of DME  or AE   OT Treatment/Interventions: Self-care/ADL training;Therapeutic exercise;Therapeutic activities;Patient/family education;Balance training;DME and/or AE instruction      OT Goals(Current goals can be found in the care plan section)   Acute Rehab OT Goals Patient Stated Goal: to go home OT Goal Formulation: With patient/family Time For Goal Achievement: 08/10/23 Potential to Achieve Goals: Good   OT Frequency:  Min 1X/week       AM-PAC OT "6 Clicks" Daily Activity     Outcome Measure Help from another person eating meals?: None Help from another person taking care of personal grooming?: None Help from another person toileting, which includes using toliet, bedpan, or urinal?: A Lot Help from another person bathing (including washing, rinsing, drying)?: A Lot Help from another person to put on and taking off regular upper body clothing?: A Little Help from another person to put on and taking off regular lower body clothing?: A Lot 6 Click Score: 17   End of Session Equipment Utilized During Treatment: Rolling walker (2 wheels);Gait belt Nurse Communication: Mobility status  Activity Tolerance: Patient tolerated treatment well Patient left: in bed;with call bell/phone within reach;with family/visitor present;with nursing/sitter in room  OT Visit Diagnosis: Other abnormalities of gait and mobility (R26.89);Repeated falls (R29.6);Muscle weakness (generalized) (M62.81);Unsteadiness on feet (R26.81)                Time: 2956-2130 OT Time Calculation (min): 30 min Charges:  OT General Charges $OT Visit: 1 Visit OT Evaluation $OT Eval  Low Complexity: 1 Low  Tineka Uriegas L. Matan Steen, OTR/L  07/27/23, 12:18 PM

## 2023-07-27 NOTE — ED Notes (Signed)
 Patient back in room

## 2023-07-27 NOTE — ED Notes (Signed)
Patient transferred to CT by tech

## 2023-07-28 ENCOUNTER — Telehealth: Payer: Self-pay | Admitting: Internal Medicine

## 2023-07-28 DIAGNOSIS — J189 Pneumonia, unspecified organism: Secondary | ICD-10-CM | POA: Diagnosis not present

## 2023-07-28 MED ORDER — SODIUM CHLORIDE 0.9 % IV SOLN
1.0000 g | Freq: Every day | INTRAVENOUS | Status: DC
  Administered 2023-07-28 – 2023-07-31 (×4): 1 g via INTRAVENOUS
  Filled 2023-07-28 (×4): qty 10

## 2023-07-28 MED ORDER — GUAIFENESIN ER 600 MG PO TB12
1200.0000 mg | ORAL_TABLET | Freq: Two times a day (BID) | ORAL | Status: DC
  Administered 2023-07-28 – 2023-07-31 (×7): 1200 mg via ORAL
  Filled 2023-07-28 (×7): qty 2

## 2023-07-28 MED ORDER — SODIUM CHLORIDE 0.9 % IV SOLN
500.0000 mg | INTRAVENOUS | Status: DC
  Administered 2023-07-28: 500 mg via INTRAVENOUS
  Filled 2023-07-28 (×3): qty 5

## 2023-07-28 NOTE — Progress Notes (Signed)
Physical Therapy Treatment Patient Details Name: Johnathan Arnold. MRN: [id number] DOB: 11/14/1945 Today's Date: 07/28/2023   History of Present Illness 78 y.o. male presenting with weakness, diverticulitis and RSV. PMHx: CAD/CABG, HTN, CKD stage IIIa, Parkinson's disease-BLE tremors.    PT Comments  Pt resting in bed upon PT arrival; pt agreeable to PT session; pt's wife present.  During session pt modified independent with bed mobility; min assist progressing to CGA with transfers; and min assist quickly progressing to CGA ambulating 90 feet with RW use.  Improved balance noted with vc's for posture/positioning and technique.  Will continue to focus on strengthening, balance, and progressive functional mobility during hospitalization.    If plan is discharge home, recommend the following: Assistance with cooking/housework;Assist for transportation;Help with stairs or ramp for entrance;A little help with walking and/or transfers;A little help with bathing/dressing/bathroom   Can travel by private vehicle     Yes  Equipment Recommendations  None recommended by PT    Recommendations for Other Services       Precautions / Restrictions Precautions Precautions: Fall Recall of Precautions/Restrictions: Intact Restrictions Weight Bearing Restrictions Per Provider Order: No     Mobility  Bed Mobility Overal bed mobility: Needs Assistance Bed Mobility: Supine to Sit, Sit to Supine     Supine to sit: Modified independent (Device/Increase time), HOB elevated Sit to supine: Modified independent (Device/Increase time), HOB elevated   General bed mobility comments: mild increased effort for pt to perform on own    Transfers Overall transfer level: Needs assistance Equipment used: Rolling walker (2 wheels) Transfers: Sit to/from Stand Sit to Stand: Min assist, Contact guard assist           General transfer comment: x6 trials standing from bed; vc's for scooting to edge of bed,  UE/LE positioning, and shifting weight forward when standing to prevent posterior lean; min assist 1st trial and CGA after that    Ambulation/Gait Ambulation/Gait assistance: Min assist, Contact guard assist Gait Distance (Feet): 90 Feet Assistive device: Rolling walker (2 wheels) Gait Pattern/deviations: Step-through pattern, Decreased step length - right, Decreased step length - left, Shuffle, Trunk flexed Gait velocity: decreased     General Gait Details: min assist first few feet of ambulation but CGA after that; no loss of balance noted   Stairs             Wheelchair Mobility     Tilt Bed    Modified Rankin (Stroke Patients Only)       Balance Overall balance assessment: Needs assistance Sitting-balance support: No upper extremity supported, Feet supported Sitting balance-Leahy Scale: Good Sitting balance - Comments: steady reaching within BOS   Standing balance support: Bilateral upper extremity supported, Reliant on assistive device for balance Standing balance-Leahy Scale: Fair Standing balance comment: steady static standing with B UE support on RW                            Communication Communication Communication: No apparent difficulties Factors Affecting Communication: Hearing impaired  Cognition Arousal: Alert Behavior During Therapy: WFL for tasks assessed/performed   PT - Cognitive impairments: No apparent impairments                         Following commands: Intact      Cueing Cueing Techniques: Verbal cues  Exercises      General Comments  Pt agreeable to PT session.  Pertinent Vitals/Pain Pain Assessment Pain Assessment: No/denies pain HR and SpO2 on room air stable during session.    Home Living                          Prior Function            PT Goals (current goals can now be found in the care plan section) Acute Rehab PT Goals Patient Stated Goal: to go back home and go back  to PT with [name] PT Goal Formulation: With patient Time For Goal Achievement: 08/10/23 Potential to Achieve Goals: Good Progress towards PT goals: Progressing toward goals    Frequency    Min 1X/week      PT Plan      Co-evaluation              AM-PAC PT "6 Clicks" Mobility   Outcome Measure  Help needed turning from your back to your side while in a flat bed without using bedrails?: None Help needed moving from lying on your back to sitting on the side of a flat bed without using bedrails?: None Help needed moving to and from a bed to a chair (including a wheelchair)?: A Little Help needed standing up from a chair using your arms (e.g., wheelchair or bedside chair)?: A Little Help needed to walk in hospital room?: A Little Help needed climbing 3-5 steps with a railing? : A Little 6 Click Score: 20    End of Session Equipment Utilized During Treatment: Gait belt Activity Tolerance: Patient tolerated treatment well Patient left: in bed;with call bell/phone within reach;with bed alarm set;with family/visitor present Nurse Communication: Mobility status;Precautions PT Visit Diagnosis: Unsteadiness on feet (R26.81);History of falling (Z91.81);Difficulty in walking, not elsewhere classified (R26.2);Muscle weakness (generalized) (M62.81)     Time: 1610-9604 PT Time Calculation (min) (ACUTE ONLY): 18 min  Charges:    $Therapeutic Activity: 8-22 mins PT General Charges $$ ACUTE PT VISIT: 1 Visit                     Hendricks Limes, PT 07/28/23, 5:36 PM

## 2023-07-28 NOTE — TOC Initial Note (Signed)
Transition of Care (TOC) - Initial/Assessment Note    Patient Details  Name: Johnathan Arnold. MRN: [id number] Date of Birth: Jan 26, 1946  Transition of Care [facility]) CM/SW Contact:    Chapman Fitch, RN Phone Number: 07/28/2023, 12:03 PM  Clinical Narrative:                    Admitted WUJ:[id number]  Admitted from: home with wife PCP: Darrick Huntsman Current home health/prior home health/DME: RW, can, shower seat  Patient states that he plans to continue to go to outpatient therapy at [facility] at discharge.  Wife states that she provides transport to therapy       Patient Goals and CMS Choice            Expected Discharge Plan and Services                                              Prior Living Arrangements/Services                       Activities of Daily Living   ADL Screening (condition at time of admission) Independently performs ADLs?: No Does the patient have a NEW difficulty with bathing/dressing/toileting/self-feeding that is expected to last >3 days?: Yes (Initiates electronic notice to provider for possible OT consult) Does the patient have a NEW difficulty with getting in/out of bed, walking, or climbing stairs that is expected to last >3 days?: Yes (Initiates electronic notice to provider for possible PT consult) Does the patient have a NEW difficulty with communication that is expected to last >3 days?: No Is the patient deaf or have difficulty hearing?: No Does the patient have difficulty seeing, even when wearing glasses/contacts?: Yes Does the patient have difficulty concentrating, remembering, or making decisions?: No  Permission Sought/Granted                  Emotional Assessment              Admission diagnosis:  Diverticulitis [K57.92] Lung mass [R91.8] RSV infection [B33.8] Patient Active Problem List   Diagnosis Date Noted   Diverticulitis 07/26/2023   RSV (respiratory syncytial virus pneumonia) 07/26/2023    UTI (urinary tract infection) 07/02/2023   Impaired ambulation 07/02/2023   Orthostatic hypotension 05/22/2023   Generalized weakness 05/07/2021   Bradycardia    Anemia, unspecified 02/07/2021   Neutropenia (HCC) 01/29/2021   Thrombocytopenia (HCC) 01/29/2021   Hyponatremia 12/22/2020   Bilateral leg weakness 12/20/2020   Prostate cancer screening 09/08/2020   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) 09/06/2020   Low back pain 08/19/2019   Pulmonary hypertension (HCC)    Insomnia 12/21/2018   Sleep apnea in adult 12/09/2018   Periodic limb movement disorder 12/09/2018   Pulmonary nodules 05/18/2018   Wears hearing aid in both ears 05/17/2018   Leg pain, bilateral 02/20/2018   Dyspnea    CKD stage 3a, GFR 45-59 ml/min (HCC) 09/21/2017   History of skin cancer in adulthood 08/14/2016   Parkinson's disease (HCC) 08/09/2016   Bilateral carotid artery stenosis 04/02/2015   Vertigo, peripheral 10/17/2014   Benign prostatic hyperplasia with urinary frequency 01/31/2014   Obesity 04/03/2013   Other malaise and fatigue 09/21/2012   Hyperlipidemia    Essential hypertension    3-vessel coronary artery disease    S/P CABG x 5  PCP:  [name], MD Pharmacy:   [facility] [id number] - [place], [place] - [address] [place] [address] Phone: [phone] Fax: [phone]     Social Drivers of Health (SDOH) Social History: SDOH Screenings   Food Insecurity: No Food Insecurity (07/27/2023)  Housing: Low Risk  (07/27/2023)  Transportation Needs: No Transportation Needs (07/27/2023)  Utilities: Not At Risk (07/27/2023)  Alcohol Screen: Low Risk  (05/19/2023)  Depression (PHQ2-9): Low Risk  (01/20/2023)  Financial Resource Strain: Low Risk  (05/19/2023)  Physical Activity: Sufficiently Active (05/19/2023)  Social Connections: Socially Isolated (07/27/2023)  Stress: No Stress Concern Present (05/19/2023)  Tobacco Use: Medium  Risk (07/26/2023)   SDOH Interventions:     Readmission Risk Interventions     No data to display

## 2023-07-28 NOTE — Plan of Care (Signed)

## 2023-07-28 NOTE — Plan of Care (Signed)
  Problem: Clinical Measurements: Goal: Diagnostic test results will improve Outcome: Progressing Goal: Respiratory complications will improve Outcome: Progressing Goal: Cardiovascular complication will be avoided Outcome: Progressing   Problem: Activity: Goal: Risk for activity intolerance will decrease Outcome: Progressing   Problem: Nutrition: Goal: Adequate nutrition will be maintained Outcome: Progressing   Problem: Elimination: Goal: Will not experience complications related to bowel motility Outcome: Progressing Goal: Will not experience complications related to urinary retention Outcome: Progressing   Problem: Pain Managment: Goal: General experience of comfort will improve and/or be controlled Outcome: Progressing   Problem: Safety: Goal: Ability to remain free from injury will improve Outcome: Progressing   Problem: Skin Integrity: Goal: Risk for impaired skin integrity will decrease Outcome: Progressing

## 2023-07-28 NOTE — Progress Notes (Signed)
Occupational Therapy Treatment Patient Details Name: Johnathan Arnold. MRN: [id number] DOB: July 14, 1945 Today's Date: 07/28/2023   History of present illness 78 y.o. male presenting with weakness, diverticulitis and RSV. PMHx: CAD/CABG, HTN, CKD stage IIIa, Parkinson's disease-BLE tremors.   OT comments  Pt seen for OT tx this date, spouse present throughout session. Pt denies pain. Bed mobility supervision, transfers and mobility in room with RW initially minA fading to CGA, stands for grooming tasks at sink and performs LB dressing from sit<>stands CGA. Repetitions of STS transfers performed for carryover and focus on anterior weight shift, pt able to complete 5x STS with correct technique and supervision end of session. Pt left with needs in reach, spouse in room, alarm activated. Discharge recommendations updated - pt would benefit from outpatient OT services to focus on UB strength, FMC, and handwriting. OT will continue to follow, pt making good progress towards goals.       If plan is discharge home, recommend the following:  Assist for transportation;Assistance with cooking/housework;A little help with walking and/or transfers;A lot of help with bathing/dressing/bathroom;Help with stairs or ramp for entrance   Equipment Recommendations  None recommended by OT       Precautions / Restrictions Precautions Precautions: Fall Recall of Precautions/Restrictions: Intact Restrictions Weight Bearing Restrictions Per Provider Order: No       Mobility Bed Mobility Overal bed mobility: Needs Assistance Bed Mobility: Supine to Sit, Sit to Supine     Supine to sit: Supervision Sit to supine: Supervision        Transfers Overall transfer level: Needs assistance Equipment used: Rolling walker (2 wheels) Transfers: Sit to/from Stand Sit to Stand: Contact guard assist           General transfer comment: first attempt minA, cues for hand placement, performs 5x STS focusing on  correct technique and anterior vs posterior weight shift (spouse indicates pt has a tendency to fall backwards)     Balance Overall balance assessment: Needs assistance Sitting-balance support: No upper extremity supported, Feet supported Sitting balance-Leahy Scale: Good                                     ADL either performed or assessed with clinical judgement   ADL Overall ADL's : Needs assistance/impaired     Grooming: Wash/dry hands;Wash/dry face;Oral care;Standing Grooming Details (indicate cue type and reason): CGA for standing balance at sink     Lower Body Bathing: Set up;Sit to/from stand Lower Body Bathing Details (indicate cue type and reason): CGA for dynamic standing balance, stands to perform with unilateral support on RW.     Lower Body Dressing: Contact guard assist;Sit to/from stand Lower Body Dressing Details (indicate cue type and reason): to don boxer briefs     Toileting- Clothing Manipulation and Hygiene: Minimal assistance;Sit to/from stand Toileting - Clothing Manipulation Details (indicate cue type and reason): minA to attempt urinal use in standing     Functional mobility during ADLs: Contact guard assist;Rolling walker (2 wheels) General ADL Comments: Pt performs ADLs standing at sink for grooming, LB dressing from sit<>stands, functional mobility in room 2x laps with seated rest break in between     Communication Communication Communication: No apparent difficulties Factors Affecting Communication: Hearing impaired   Cognition Arousal: Alert Behavior During Therapy: WFL for tasks assessed/performed Cognition: No apparent impairments  Following commands: Intact        Cueing   Cueing Techniques: Verbal cues, Tactile cues             Pertinent Vitals/ Pain       Pain Assessment Pain Assessment: No/denies pain   Frequency  Min 1X/week        Progress Toward Goals  OT  Goals(current goals can now be found in the care plan section)  Progress towards OT goals: Progressing toward goals  Acute Rehab OT Goals OT Goal Formulation: With patient/family Time For Goal Achievement: 08/10/23 Potential to Achieve Goals: Good ADL Goals Pt Will Perform Lower Body Bathing: with supervision;sitting/lateral leans;sit to/from stand Pt Will Perform Lower Body Dressing: with set-up;sitting/lateral leans;sit to/from stand Pt Will Transfer to Toilet: with supervision;ambulating;regular height toilet Pt Will Perform Toileting - Clothing Manipulation and hygiene: sit to/from stand;sitting/lateral leans;with set-up  Plan         AM-PAC OT "6 Clicks" Daily Activity     Outcome Measure   Help from another person eating meals?: None Help from another person taking care of personal grooming?: None Help from another person toileting, which includes using toliet, bedpan, or urinal?: A Little Help from another person bathing (including washing, rinsing, drying)?: A Little Help from another person to put on and taking off regular upper body clothing?: A Little Help from another person to put on and taking off regular lower body clothing?: A Little 6 Click Score: 20    End of Session Equipment Utilized During Treatment: Rolling walker (2 wheels);Gait belt  OT Visit Diagnosis: Other abnormalities of gait and mobility (R26.89);Repeated falls (R29.6);Muscle weakness (generalized) (M62.81);Unsteadiness on feet (R26.81)   Activity Tolerance Patient tolerated treatment well   Patient Left in bed;with call bell/phone within reach;with family/visitor present   Nurse Communication Mobility status        Time: 1610-9604 OT Time Calculation (min): 38 min  Charges: OT General Charges $OT Visit: 1 Visit OT Treatments $Self Care/Home Management : 38-52 mins  Shannen Flansburg L. Rayne Cowdrey, OTR/L  07/28/23, 3:38 PM

## 2023-07-28 NOTE — Progress Notes (Signed)
Progress Note   Patient: Johnathan Arnold. GNF:[id number] DOB: 1945/07/24 DOA: 07/26/2023     2 DOS: the patient was seen and examined on 07/28/2023   Brief hospital course:  Johnathan Arnold. is a 78 y.o. male with medical history significant for CAD status post CABG, type 2 diabetes, hyperlipidemia, hypertension, Parkinson's disease presenting with weakness, diverticulitis, RSV.  Limited history in the setting of generalized Parkinson disease and mild encephalopathy.  Per report, EMS called to home for worsening generalized weakness.  Patient is been sick x 5 days and unable to get out of bed.  Noted baseline general weakness in the setting of Parkinson disease.  Last admission January 23 through February 3 for similar issues with predominant concern for Parkinson's and medical noncompliance.  No reported fevers or chills.  Per report patient with?  Urinary symptoms.  Patient denies any active dysuria or increased urinary frequency.  Minimal abdominal pain. Presented to the ER Tmax 99.3, hemodynamically stable.  Satting well on room air.  White count 3.5, hemoglobin 12.5, platelets 135, lactate 0.9, troponin in the 20s, urinalysis not indicative of infection.  RSV positive.  Creatinine 1.5.  CT abdomen pelvis with noted acute diverticulitis of the sigmoid colon.  Masslike area in the right lower lobe concerning for potential neoplasm.    Assessment and Plan:  Right lower lobe pneumonia RSV infection ??  Superimposed bacterial infection in a patient with RSV infection Concern for possible aspiration pneumonia in a patient with known Parkinson's disease Patient had a CT scan of abdomen and pelvis which showed mass-like area in the right lower lobe concerning for potential neoplasm. Further evaluation with dedicated contrast-enhanced chest CT should be considered in the near future to better evaluate these findings. CT chest with IV contrast showed right lower lobe pneumonia.  Patient is not  hypoxic and is afebrile Continue empiric antibiotic therapy with Rocephin and Zithromax patient may be discharged on oral antibiotics Speech therapy consult for swallow function evaluation   * Diverticulitis Patient denies having any abdominal pain Noted to have mild diverticulitis on CT imaging Monitor closely     CKD stage 3a, GFR 45-59 ml/min (HCC) Cr 1.5 w/ GFR in 40s  Appears near baseline  Monitor     Essential hypertension Continue Imdur and hydralazine      Generalized weakness Decompensated generalized weakness in the setting of diverticulitis, RSV, right lower lobe pneumonia, baseline Parkinson's disease Mild overlapping encephalopathy-which has improved, patient is back to his baseline Unclear general baseline IV fluid hydration Continue home Parkinson's regimen PT OT evaluation     3-vessel coronary artery disease Baseline CAD s/p CABG  No active CP  Cont home regimen  Monitor     Sleep apnea in adult OSA        Pancytopenia Most likely secondary to viral illness Monitor closely            Subjective: Complains of a cough.  Denies having abdominal pain  Physical Exam: Vitals:   07/27/23 1750 07/27/23 1939 07/28/23 0350 07/28/23 0804  BP: 135/64 (!) 146/56 (!) 152/63 (!) 147/58  Pulse: 62 60 70 64  Resp: 16 20 20 16   Temp: 98 F (36.7 C) 97.9 F (36.6 C) 98 F (36.7 C) 98 F (36.7 C)  TempSrc: Oral Oral Oral Oral  SpO2: 97% 97% 96% 95%  Weight:      Height:        Appearance: He is normal weight.     Comments:  Underweight  Mildly lethargic   HENT:     Head: Normocephalic and atraumatic.     Nose: Nose normal.     Mouth/Throat:     Mouth: Mucous membranes are moist.  Eyes:     Pupils: Pupils are equal, round, and reactive to light.  Cardiovascular:     Rate and Rhythm: Normal rate and regular rhythm.  Pulmonary:     Effort: Pulmonary effort is normal.  Abdominal:     General: Bowel sounds are normal.  Musculoskeletal:      Comments: + generalized weakness   Neurological:     Comments: + generalized weakness    Psychiatric:        Mood and Affect: Mood normal.      Data Reviewed: Labs reviewed.  Creatinine 1.50 There are no new results to review at this time.  Family Communication: Plan of care discussed with patient's son, Nida Boatman over the phone.  All questions and concerns have been addressed.  Disposition: Status is: Inpatient Remains inpatient appropriate because: Awaiting PT evaluation for discharge disposition  Planned Discharge Destination:  TBD    Time spent: 40 minutes  Author: Lucile Shutters, MD 07/28/2023 2:50 PM  For on call review [email or web address].

## 2023-07-28 NOTE — Telephone Encounter (Signed)
Do to winter weather, your appointment on 07/28/2022 will be a virtual with Dr Darrick Huntsman.If you are unable to do a virtual please call the office at [phone] to reschedule.  Thank you.

## 2023-07-29 ENCOUNTER — Ambulatory Visit: Payer: Medicare Other | Admitting: Internal Medicine

## 2023-07-29 DIAGNOSIS — J189 Pneumonia, unspecified organism: Secondary | ICD-10-CM | POA: Diagnosis not present

## 2023-07-29 LAB — PROCALCITONIN: Procalcitonin: <0.1 ng/mL

## 2023-07-29 MED ORDER — SODIUM CHLORIDE 0.9 % IV SOLN
500.0000 mg | INTRAVENOUS | Status: DC
  Administered 2023-07-29 – 2023-07-31 (×3): 500 mg via INTRAVENOUS
  Filled 2023-07-29 (×3): qty 5

## 2023-07-29 NOTE — Progress Notes (Signed)
PT Cancellation Note  Patient Details Name: Johnathan Arnold. MRN: [id number] DOB: 1945-10-15   Cancelled Treatment:     PT attempt. Pt sleep upon arrival. He easily awakes and seems oriented however pt requested not to get up/ have session at this time." It has not been a good day. Can you come back tomorrow?"   Rushie Chestnut 07/29/2023, 2:42 PM

## 2023-07-29 NOTE — Care Management Important Message (Signed)
Important Message  Patient Details  Name: Johnathan Arnold. MRN: [id number] Date of Birth: April 02, 1946   Important Message Given:  Yes - Medicare IM     Cristela Blue, CMA 07/29/2023, 10:26 AM

## 2023-07-29 NOTE — Evaluation (Signed)
Clinical/Bedside Swallow Evaluation Patient Details  Name: Johnathan Arnold. MRN: [id number] Date of Birth: 1946/06/05  Today's Date: 07/29/2023 Time: SLP Start Time (ACUTE ONLY): 0825 SLP Stop Time (ACUTE ONLY): 0840 SLP Time Calculation (min) (ACUTE ONLY): 15 min  Past Medical History:  Past Medical History:  Diagnosis Date   3-vessel coronary artery disease    s/p  5 vessel CABG   Diabetes mellitus without complication (HCC)    History of cardiac catheterization 2011   [facility]   Hyperlipidemia    Hypertension    Hypertriglyceridemia    Parkinson's disease (HCC)    Pneumonia 12/28/2020   S/P CABG x 5 11-99   Vertigo    Past Surgical History:  Past Surgical History:  Procedure Laterality Date   CARDIAC CATHETERIZATION  05-19-2010   [facility]: Patent grafts. LIMA to LAD, SVG to D1, OM1 and RPDA   CORONARY ARTERY BYPASS GRAFT  03/1998   5 vessel, [facility]   RIGHT HEART CATH N/A 04/04/2019   Procedure: RIGHT HEART CATH;  Surgeon: Iran Ouch, MD;  Location: [facility] INVASIVE CV LAB;  Service: Cardiovascular;  Laterality: N/A;   RIGHT/LEFT HEART CATH AND CORONARY ANGIOGRAPHY N/A 02/01/2018   Procedure: RIGHT/LEFT HEART CATH AND CORONARY ANGIOGRAPHY;  Surgeon: Iran Ouch, MD;  Location: [facility] INVASIVE CV LAB;  Service: Cardiovascular;  Laterality: N/A;   HPI:  Johnathan Arnold. is a 78 y.o. male with medical history significant for CAD status post CABG, type 2 diabetes, hyperlipidemia, hypertension, Parkinson's disease presenting with weakness, diverticulitis, RSV on 07/26/2023. Noted baseline general weakness in the setting of Parkinson disease.  Last admission January 23 through February 3 for similar issues with predominant concern for Parkinson's and medical noncompliance.  RSV positive.  Creatinine 1.5.  CT abdomen pelvis with noted acute diverticulitis of the sigmoid colon.  Masslike area in the right lower lobe concerning for potential neoplasm. Chest CT on 07/27/2023  revealed  Small bilateral pleural effusions with minimal compressive atelectasis of the adjacent lungs. There is a cluster of ground-glass airspace opacity in the right lower lobe and right lung base consistent with pneumonia. Aspiration is not excluded. Clinical correlation and follow-up to resolution recommended.    Assessment / Plan / Recommendation  Clinical Impression  Pt presents with adequate oropharyngeal abilities when consuming mixed consistency cereal with milk. Pt was initially sleeping with SLP entered room but he was easily aroused, able to follow basic directions for re-positioning in be for safe PO intake. With rare Min A for set-up, pt able to consume his cereal with milk without any overt s/s of aspiration. Pt's pharyngeal swallow response appeared swift. While silent aspiration cannot be ruled out at bedside, pt appears at reduced risk of aspiration when following general aspiration precautions. At this time, current diet appears appropriate with current diet. No further services are indicated at this time. SLP Visit Diagnosis: Dysphagia, unspecified (R13.10)    Aspiration Risk  No limitations;Mild aspiration risk    Diet Recommendation Regular;Thin liquid    Liquid Administration via: Cup;Straw Medication Administration: Whole meds with liquid Supervision: Intermittent supervision to cue for compensatory strategies;Patient able to self feed Compensations: Minimize environmental distractions;Slow rate;Small sips/bites Postural Changes: Seated upright at 90 degrees;Remain upright for at least 30 minutes after po intake    Other  Recommendations Oral Care Recommendations: Oral care BID    Recommendations for follow up therapy are one component of a multi-disciplinary discharge planning process, led by the attending physician.  Recommendations  may be updated based on patient status, additional functional criteria and insurance authorization.  Follow up Recommendations No SLP  follow up      Assistance Recommended at Discharge  N/A  Functional Status Assessment Patient has not had a recent decline in their functional status  Frequency and Duration   N/A         Prognosis   N/A     Swallow Study   General Date of Onset: 07/26/23 HPI: Johnathan Arnold. is a 78 y.o. male with medical history significant for CAD status post CABG, type 2 diabetes, hyperlipidemia, hypertension, Parkinson's disease presenting with weakness, diverticulitis, RSV on 07/26/2023. Noted baseline general weakness in the setting of Parkinson disease.  Last admission January 23 through February 3 for similar issues with predominant concern for Parkinson's and medical noncompliance.  RSV positive.  Creatinine 1.5.  CT abdomen pelvis with noted acute diverticulitis of the sigmoid colon.  Masslike area in the right lower lobe concerning for potential neoplasm. Chest CT on 07/27/2023 revealed  Small bilateral pleural effusions with minimal compressive atelectasis of the adjacent lungs. There is a cluster of ground-glass airspace opacity in the right lower lobe and right lung base consistent with pneumonia. Aspiration is not excluded. Clinical correlation and follow-up to resolution recommended. Type of Study: Bedside Swallow Evaluation Previous Swallow Assessment: none in chart Diet Prior to this Study: Regular;Thin liquids (Level 0) Temperature Spikes Noted: No Respiratory Status: Room air History of Recent Intubation: No Behavior/Cognition: Alert;Cooperative;Pleasant mood Oral Cavity Assessment: Within Functional Limits Oral Care Completed by SLP: No Oral Cavity - Dentition: Adequate natural dentition Vision: Functional for self-feeding Self-Feeding Abilities: Needs assist;Needs set up Patient Positioning: Upright in bed Baseline Vocal Quality: Normal Volitional Cough: Strong Volitional Swallow: Able to elicit    Oral/Motor/Sensory Function Overall Oral Motor/Sensory Function: Within  functional limits   Ice Chips Ice chips: Not tested   Thin Liquid Thin Liquid: Within functional limits Presentation: Self Fed;Cup;Straw    Nectar Thick Nectar Thick Liquid: Not tested   Honey Thick Honey Thick Liquid: Not tested   Puree Puree: Not tested   Solid     Solid: Within functional limits Presentation: Self Fed;Spoon Other Comments: mixed consistency - cereal with milk     Shantrice Rodenberg B. Dreama Saa, M.S., CCC-SLP, Tree surgeon Certified Brain Injury Specialist [facility]  [facility] Office [phone] Ascom [phone] Fax [phone]

## 2023-07-29 NOTE — Plan of Care (Signed)

## 2023-07-29 NOTE — Progress Notes (Signed)
PT Cancellation Note  Patient Details Name: Johnathan Arnold. MRN: [id number] DOB: 04-Mar-1946   Cancelled Treatment:     PT attempt. SLP entering room. Author will return later this date and continue to follow per current POC    Rushie Chestnut 07/29/2023, 8:35 AM

## 2023-07-29 NOTE — Progress Notes (Signed)
PROGRESS NOTE    Johnathan Arnold.  NWG:[id number] DOB: 19-Jul-1945 DOA: 07/26/2023 PCP: [name], MD  Assessment & Plan:   Principal Problem:   Right lower lobe pneumonia Active Problems:   Essential hypertension   CKD stage 3a, GFR 45-59 ml/min (HCC)   Diverticulitis   Generalized weakness   RSV (respiratory syncytial virus pneumonia)   3-vessel coronary artery disease   Sleep apnea in adult  Assessment and Plan: Right lower lobe pneumonia: possible aspiration pneumonia vs CAP. Continue on IV rocephin, azithromycin, bronchodilators & encourage incentive spirometry  RSV infection: continue w/ supportive care  Diverticulitis: mild as per CT. Continue on IV rocephin   CKDIIIa: Cr is labile. Avoid nephrotoxic meds    HTN: continue on imdur, hydralazine   Generalized weakness: PT/OT recs outpatient therapy    Hx of CAD: w/ hx of CABG, Continue on imdur, statin    OSA: unclear if pt uses CPAP at home    Pancytopenia: likely secondary to viral illness. Will continue to monitor       DVT prophylaxis: lovenox  Code Status: full  Family Communication: Disposition Plan: likely d/c back home  Level of care: Med-Surg  Status is: Inpatient Remains inpatient appropriate because: severity of illness, requiring IV abxs    Consultants:    Procedures:   Antimicrobials: rocephin    Subjective: Pt c/o malaise  Objective: Vitals:   07/28/23 1824 07/29/23 0226 07/29/23 0236 07/29/23 0335  BP: (!) 142/56 (!) 188/60 (!) 188/60 (!) 160/58  Pulse: 63 (!) 54  (!) 58  Resp: 16 16    Temp:  98.4 F (36.9 C)    TempSrc:  Oral    SpO2: 99% 99%    Weight:      Height:        Intake/Output Summary (Last 24 hours) at 07/29/2023 0857 Last data filed at 07/29/2023 0223 Gross per 24 hour  Intake 475 ml  Output 400 ml  Net 75 ml   Filed Weights   07/26/23 0839  Weight: 74 kg    Examination:  General exam: Appears calm and comfortable  Respiratory system:  diminished breath sounds b/l  Cardiovascular system: S1 & S2+. No rubs, gallops or clicks. Gastrointestinal system: Abdomen is nondistended, soft and nontender. Normal bowel sounds heard. Central nervous system: Alert and oriented. Moves all extremities  Psychiatry: Judgement and insight appear normal. Flat mood and affect     Data Reviewed: I have personally reviewed following labs and imaging studies  CBC: Recent Labs  Lab 07/26/23 0847 07/27/23 0543  WBC 3.5* 2.9*  NEUTROABS 2.5  --   HGB 12.5* 11.0*  HCT 36.0* 31.8*  MCV 99.2 101.0*  PLT 135* 127*   Basic Metabolic Panel: Recent Labs  Lab 07/26/23 0847 07/27/23 0543  NA 135 135  K 4.1 3.8  CL 99 101  CO2 24 21*  GLUCOSE 93 89  BUN 19 22  CREATININE 1.51* 1.50*  CALCIUM 9.4 8.4*   GFR: Estimated Creatinine Clearance: 36.6 mL/min (A) (by C-G formula based on SCr of 1.5 mg/dL (H)). Liver Function Tests: Recent Labs  Lab 07/26/23 0847 07/27/23 0543  AST 30 27  ALT 22 23  ALKPHOS 40 34*  BILITOT 1.5* 1.1  PROT 6.9 6.0*  ALBUMIN 3.9 3.2*   No results for input(s): "LIPASE", "AMYLASE" in the last 168 hours. No results for input(s): "AMMONIA" in the last 168 hours. Coagulation Profile: No results for input(s): "INR", "PROTIME" in the last 168  hours. Cardiac Enzymes: Recent Labs  Lab 07/26/23 0847  CKTOTAL 317   BNP (last 3 results) No results for input(s): "PROBNP" in the last 8760 hours. HbA1C: No results for input(s): "HGBA1C" in the last 72 hours. CBG: No results for input(s): "GLUCAP" in the last 168 hours. Lipid Profile: No results for input(s): "CHOL", "HDL", "LDLCALC", "TRIG", "CHOLHDL", "LDLDIRECT" in the last 72 hours. Thyroid Function Tests: No results for input(s): "TSH", "T4TOTAL", "FREET4", "T3FREE", "THYROIDAB" in the last 72 hours. Anemia Panel: No results for input(s): "VITAMINB12", "FOLATE", "FERRITIN", "TIBC", "IRON", "RETICCTPCT" in the last 72 hours. Sepsis Labs: Recent Labs   Lab 07/26/23 0847 07/26/23 1125  LATICACIDVEN 1.1 0.9    Recent Results (from the past 240 hours)  Resp panel by RT-PCR (RSV, Flu A&B, Covid) Anterior Nasal Swab     Status: Abnormal   Collection Time: 07/26/23  8:48 AM   Specimen: Anterior Nasal Swab  Result Value Ref Range Status   SARS Coronavirus 2 by RT PCR NEGATIVE NEGATIVE Final    Comment: (NOTE) SARS-CoV-2 target nucleic acids are NOT DETECTED.  The SARS-CoV-2 RNA is generally detectable in upper respiratory specimens during the acute phase of infection. The lowest concentration of SARS-CoV-2 viral copies this assay can detect is 138 copies/mL. A negative result does not preclude SARS-Cov-2 infection and should not be used as the sole basis for treatment or other patient management decisions. A negative result may occur with  improper specimen collection/handling, submission of specimen other than nasopharyngeal swab, presence of viral mutation(s) within the areas targeted by this assay, and inadequate number of viral copies(<138 copies/mL). A negative result must be combined with clinical observations, patient history, and epidemiological information. The expected result is Negative.  Fact Sheet for Patients:  BloggerCourse.com  Fact Sheet for Healthcare Providers:  SeriousBroker.it  This test is no t yet approved or cleared by the [place] FDA and  has been authorized for detection and/or diagnosis of SARS-CoV-2 by FDA under an Emergency Use Authorization (EUA). This EUA will remain  in effect (meaning this test can be used) for the duration of the COVID-19 declaration under Section 564(b)(1) of the Act, 21 U.S.C.section 360bbb-3(b)(1), unless the authorization is terminated  or revoked sooner.       Influenza A by PCR NEGATIVE NEGATIVE Final   Influenza B by PCR NEGATIVE NEGATIVE Final    Comment: (NOTE) The Xpert Xpress SARS-CoV-2/FLU/RSV plus assay is  intended as an aid in the diagnosis of influenza from Nasopharyngeal swab specimens and should not be used as a sole basis for treatment. Nasal washings and aspirates are unacceptable for Xpert Xpress SARS-CoV-2/FLU/RSV testing.  Fact Sheet for Patients: BloggerCourse.com  Fact Sheet for Healthcare Providers: SeriousBroker.it  This test is not yet approved or cleared by the [place] FDA and has been authorized for detection and/or diagnosis of SARS-CoV-2 by FDA under an Emergency Use Authorization (EUA). This EUA will remain in effect (meaning this test can be used) for the duration of the COVID-19 declaration under Section 564(b)(1) of the Act, 21 U.S.C. section 360bbb-3(b)(1), unless the authorization is terminated or revoked.     Resp Syncytial Virus by PCR POSITIVE (A) NEGATIVE Final    Comment: (NOTE) Fact Sheet for Patients: BloggerCourse.com  Fact Sheet for Healthcare Providers: SeriousBroker.it  This test is not yet approved or cleared by the [place] FDA and has been authorized for detection and/or diagnosis of SARS-CoV-2 by FDA under an Emergency Use Authorization (EUA). This EUA will remain  in effect (meaning this test can be used) for the duration of the COVID-19 declaration under Section 564(b)(1) of the Act, 21 U.S.C. section 360bbb-3(b)(1), unless the authorization is terminated or revoked.  Performed at [facility], [address], [place], [place] [address]          Radiology Studies: CT CHEST W CONTRAST Result Date: 07/27/2023 CLINICAL DATA:  Concern for a right lower lobe pleural mass. EXAM: CT CHEST WITH CONTRAST TECHNIQUE: Multidetector CT imaging of the chest was performed during intravenous contrast administration. RADIATION DOSE REDUCTION: This exam was performed according to the departmental dose-optimization program which  includes automated exposure control, adjustment of the mA and/or kV according to patient size and/or use of iterative reconstruction technique. CONTRAST:  75mL OMNIPAQUE IOHEXOL 300 MG/ML  SOLN COMPARISON:  CT abdomen pelvis dated 07/26/2023 and chest CT dated 05/07/2021. FINDINGS: Cardiovascular: There is no cardiomegaly or pericardial effusion. Advanced coronary vascular calcification and postsurgical changes of CABG. Mild atherosclerotic calcification of the thoracic aorta. No intimal dilatation or dissection. The origins of the great vessels of the aortic arch appear patent. No pulmonary artery embolus identified. Mediastinum/Nodes: No hilar adenopathy. Enlarged subcarinal lymph node measures 18 mm short axis. The esophagus is grossly unremarkable. Subcentimeter right thyroid hypodense nodule. Not clinically significant; no follow-up imaging recommended (ref: J Am Coll Radiol. 2015 Feb;12(2): 143-50).No mediastinal fluid collection. Lungs/Pleura: Small bilateral pleural effusions with minimal compressive atelectasis of the adjacent lungs. There is a cluster of ground-glass airspace opacity in the right lower lobe and right lung base consistent with pneumonia. Aspiration is not excluded. Clinical correlation and follow-up to resolution recommended. There is no pneumothorax. The central airways are patent. Upper Abdomen: Small gallstones. Musculoskeletal: Osteopenia with degenerative changes of the spine. Median sternotomy wires. No acute osseous pathology. IMPRESSION: 1. Right lower lobe pneumonia. Clinical correlation and follow-up to resolution recommended. 2. Small bilateral pleural effusions. 3. Cholelithiasis. 4.  Aortic Atherosclerosis (ICD10-I70.0). Electronically Signed   By: Elgie Collard M.D.   On: 07/27/2023 19:02        Scheduled Meds:  aspirin EC  81 mg Oral QHS   carbidopa-levodopa  2 tablet Oral QID   enoxaparin (LOVENOX) injection  40 mg Subcutaneous Q24H   guaiFENesin  1,200 mg Oral  BID   isosorbide mononitrate  60 mg Oral Daily   pantoprazole  40 mg Oral Daily   rosuvastatin  10 mg Oral Daily   tamsulosin  0.4 mg Oral QPC supper   Continuous Infusions:  azithromycin     cefTRIAXone (ROCEPHIN)  IV 1 g (07/28/23 1335)     LOS: 3 days     Charise Killian, MD [facility] Pager [phone]  If 7PM-7AM, please contact night-coverage [email or web address] 07/29/2023, 8:57 AM

## 2023-07-30 ENCOUNTER — Ambulatory Visit: Payer: Medicare Other | Admitting: Physical Therapy

## 2023-07-30 DIAGNOSIS — J189 Pneumonia, unspecified organism: Secondary | ICD-10-CM | POA: Diagnosis not present

## 2023-07-30 LAB — CBC
HCT: 30 % — ABNORMAL LOW (ref 39.0–52.0)
Hemoglobin: 10.4 g/dL — ABNORMAL LOW (ref 13.0–17.0)
MCH: 33.8 pg (ref 26.0–34.0)
MCHC: 34.7 g/dL (ref 30.0–36.0)
MCV: 97.4 fL (ref 80.0–100.0)
Platelets: 162 10*3/uL (ref 150–400)
RBC: 3.08 MIL/uL — ABNORMAL LOW (ref 4.22–5.81)
RDW: 11.9 % (ref 11.5–15.5)
WBC: 3.3 10*3/uL — ABNORMAL LOW (ref 4.0–10.5)
nRBC: 0 % (ref 0.0–0.2)

## 2023-07-30 LAB — BASIC METABOLIC PANEL
Anion gap: 9 (ref 5–15)
BUN: 10 mg/dL (ref 8–23)
CO2: 25 mmol/L (ref 22–32)
Calcium: 8.4 mg/dL — ABNORMAL LOW (ref 8.9–10.3)
Chloride: 108 mmol/L (ref 98–111)
Creatinine, Ser: 1.08 mg/dL (ref 0.61–1.24)
GFR, Estimated: >60 mL/min (ref >60)
Glucose, Bld: 100 mg/dL — ABNORMAL HIGH (ref 70–99)
Potassium: 3.4 mmol/L — ABNORMAL LOW (ref 3.5–5.1)
Sodium: 142 mmol/L (ref 135–145)

## 2023-07-30 MED ORDER — POTASSIUM CHLORIDE 20 MEQ PO PACK
20.0000 meq | PACK | Freq: Once | ORAL | Status: AC
  Administered 2023-07-30: 20 meq via ORAL
  Filled 2023-07-30: qty 1

## 2023-07-30 NOTE — Progress Notes (Signed)
Physical Therapy Treatment Patient Details Name: Johnathan Arnold. MRN: [id number] DOB: [birth year] Today's Date: 07/30/2023   History of Present Illness 78 y.o. male presenting with weakness, diverticulitis and RSV. PMHx: CAD/CABG, HTN, CKD stage IIIa, Parkinson's disease-BLE tremors.    PT Comments  Patient alert, agreeable to PT, did report start of headache with walking but declined notifying RN. He was able to perform bed mobility modI, and sit <> stand with supervision and RW. Did don underwear seated EOB, modI but in standing required stabilization of posterior calves against bed rail due to posterior lean when pulling up his underwear.  No further LOB noted, he ambulated ~212ft with RW and CGA-supervision. Returned to room with lunch in reach, pt educated on benefit of continued mobility (like to bathroom later today), verbalized understanding. The patient would benefit from further skilled PT intervention to continue to progress towards goals.    If plan is discharge home, recommend the following: Assistance with cooking/housework;Assist for transportation;Help with stairs or ramp for entrance;A little help with walking and/or transfers;A little help with bathing/dressing/bathroom   Can travel by private vehicle     Yes  Equipment Recommendations  None recommended by PT    Recommendations for Other Services       Precautions / Restrictions Precautions Precautions: Fall Recall of Precautions/Restrictions: Intact Restrictions Weight Bearing Restrictions Per Provider Order: No     Mobility  Bed Mobility Overal bed mobility: Modified Independent Bed Mobility: Supine to Sit, Sit to Supine     Supine to sit: Modified independent (Device/Increase time), HOB elevated Sit to supine: Modified independent (Device/Increase time), HOB elevated        Transfers Overall transfer level: Needs assistance Equipment used: Rolling walker (2 wheels) Transfers: Sit to/from Stand Sit  to Stand: Supervision           General transfer comment: effortful but able to complete with extra time, another effort    Ambulation/Gait   Gait Distance (Feet): 270 Feet Assistive device: Rolling walker (2 wheels)         General Gait Details: CGA to supervision, no LOB   Stairs             Wheelchair Mobility     Tilt Bed    Modified Rankin (Stroke Patients Only)       Balance Overall balance assessment: Needs assistance Sitting-balance support: No upper extremity supported, Feet supported Sitting balance-Leahy Scale: Good Sitting balance - Comments: donned underwear at EOB   Standing balance support: Bilateral upper extremity supported, Reliant on assistive device for balance Standing balance-Leahy Scale: Fair Standing balance comment: did require stabilization of BLE posterior legs on bedrails when pulling up underwear                            Communication Communication Communication: No apparent difficulties Factors Affecting Communication: Hearing impaired  Cognition Arousal: Alert Behavior During Therapy: WFL for tasks assessed/performed   PT - Cognitive impairments: No apparent impairments                                Cueing    Exercises      General Comments        Pertinent Vitals/Pain Pain Assessment Pain Assessment: Faces Faces Pain Scale: No hurt Pain Location: head Pain Descriptors / Indicators: Aching Pain Intervention(s): Limited activity within patient's tolerance, Monitored during session, Repositioned  Home Living                          Prior Function            PT Goals (current goals can now be found in the care plan section) Progress towards PT goals: Progressing toward goals    Frequency    Min 1X/week      PT Plan      Co-evaluation              AM-PAC PT "6 Clicks" Mobility   Outcome Measure  Help needed turning from your back to your side  while in a flat bed without using bedrails?: None Help needed moving from lying on your back to sitting on the side of a flat bed without using bedrails?: None Help needed moving to and from a bed to a chair (including a wheelchair)?: None Help needed standing up from a chair using your arms (e.g., wheelchair or bedside chair)?: None Help needed to walk in hospital room?: None Help needed climbing 3-5 steps with a railing? : A Little 6 Click Score: 23    End of Session Equipment Utilized During Treatment: Gait belt Activity Tolerance: Patient tolerated treatment well Patient left: in bed;with call bell/phone within reach;with bed alarm set;with family/visitor present Nurse Communication: Mobility status PT Visit Diagnosis: Unsteadiness on feet (R26.81);History of falling (Z91.81);Difficulty in walking, not elsewhere classified (R26.2);Muscle weakness (generalized) (M62.81)     Time: 8453-6468 PT Time Calculation (min) (ACUTE ONLY): 16 min  Charges:    $Therapeutic Activity: 8-22 mins PT General Charges $$ ACUTE PT VISIT: 1 Visit                     Olga Coaster PT, DPT 2:12 PM,07/30/23

## 2023-07-30 NOTE — Progress Notes (Signed)
Occupational Therapy Treatment Patient Details Name: Johnathan Arnold. MRN: [id number] DOB: [birth year] Today's Date: 07/30/2023   History of present illness 78 y.o. male presenting with weakness, diverticulitis and RSV. PMHx: CAD/CABG, HTN, CKD stage IIIa, Parkinson's disease-BLE tremors.   OT comments  Pt in bed upon OT arrival, agreeable to OOB activities d/t wanting to stretch his legs.  Spouse present for entire session.  Pt tolerated at least 10 min of standing grooming tasks at sink, transitioning to seated at bedside to finish d/t LE fatigue.  See below for additional ADL details.  Reviewed d/c rec and pt verbalizes wanting to resume his OP PT and wanting to add outpatient OT to address BUE weakness and coordination deficits.  Pt does present with bilat hand weakness limiting performance with ADLs; noting assist needed to open shaving cream top and toothpaste cap during OT session this date.  OT in agreement for OP OT upon d/c.  Will continue to follow in the acute setting to maximize safety, indep, and activity tolerance for ADLs.       If plan is discharge home, recommend the following:  Assist for transportation;Assistance with cooking/housework;A little help with walking and/or transfers;Help with stairs or ramp for entrance;A little help with bathing/dressing/bathroom   Equipment Recommendations  None recommended by OT    Recommendations for Other Services      Precautions / Restrictions Precautions Precautions: Fall Restrictions Weight Bearing Restrictions Per Provider Order: No       Mobility Bed Mobility Overal bed mobility: Needs Assistance Bed Mobility: Supine to Sit, Sit to Supine     Supine to sit: Modified independent (Device/Increase time), HOB elevated Sit to supine: Modified independent (Device/Increase time), HOB elevated     Patient Response: Cooperative  Transfers Overall transfer level: Needs assistance Equipment used: Rolling walker (2  wheels) Transfers: Sit to/from Stand Sit to Stand: Min assist           General transfer comment: min A to encourage anterior weight shift     Balance Overall balance assessment: Needs assistance Sitting-balance support: No upper extremity supported, Feet supported Sitting balance-Leahy Scale: Good Sitting balance - Comments: able to don socks sitting EOB figure 4 position   Standing balance support: Bilateral upper extremity supported, Reliant on assistive device for balance Standing balance-Leahy Scale: Fair Standing balance comment: intermittent min guard while standing at sink for grooming tasks d/t slight posterior lean with more erect standing posture                           ADL either performed or assessed with clinical judgement   ADL Overall ADL's : Needs assistance/impaired     Grooming: Wash/dry face;Oral care;Brushing hair;Standing;Sitting;Contact guard assist Grooming Details (indicate cue type and reason): Tolerated standing at sink with intermittent min guard for shaving, washing face, brushing teeth.  Transitioned to combing hair while seated EOB d/t LE fatigue following previously noted grooming tasks.  Assist needed to open top on shaving cream d/t bilat hand weakness.             Lower Body Dressing: Supervision/safety;Set up;Sitting/lateral leans Lower Body Dressing Details (indicate cue type and reason): Able to don socks figure 4 position while seated EOB     Toileting- Clothing Manipulation and Hygiene: Set up;Bed level Toileting - Clothing Manipulation Details (indicate cue type and reason): Using urinal in supine d/t urgency; encountered wet gown when finished.  Set up assist to don new  hospital gown while sitting EOB.     Functional mobility during ADLs: Contact guard assist;Rolling walker (2 wheels) General ADL Comments: Ambulatory with RW at bedside to perform standing ADLs at sink.    Extremity/Trunk Assessment Upper Extremity  Assessment Upper Extremity Assessment: Generalized weakness   Lower Extremity Assessment Lower Extremity Assessment: Generalized weakness        Vision Baseline Vision/History: 1 Wears glasses Patient Visual Report: No change from baseline     Perception     Praxis     Communication Communication Communication: No apparent difficulties Factors Affecting Communication: Hearing impaired   Cognition Arousal: Alert Behavior During Therapy: WFL for tasks assessed/performed Cognition: No apparent impairments                               Following commands: Intact        Cueing   Cueing Techniques: Verbal cues  Exercises      Shoulder Instructions       General Comments      Pertinent Vitals/ Pain       Pain Assessment Pain Assessment: No/denies pain  Home Living                                          Prior Functioning/Environment              Frequency  Min 1X/week        Progress Toward Goals  OT Goals(current goals can now be found in the care plan section)  Progress towards OT goals: Progressing toward goals  Acute Rehab OT Goals Patient Stated Goal: to go home and resume outpatient therapy services OT Goal Formulation: With patient/family Time For Goal Achievement: 08/10/23 Potential to Achieve Goals: Good  Plan      Co-evaluation                 AM-PAC OT "6 Clicks" Daily Activity     Outcome Measure   Help from another person eating meals?: None Help from another person taking care of personal grooming?: A Little Help from another person toileting, which includes using toliet, bedpan, or urinal?: A Little Help from another person bathing (including washing, rinsing, drying)?: A Little Help from another person to put on and taking off regular upper body clothing?: A Little Help from another person to put on and taking off regular lower body clothing?: A Little 6 Click Score: 19    End of  Session Equipment Utilized During Treatment: Rolling walker (2 wheels);Gait belt  OT Visit Diagnosis: Other abnormalities of gait and mobility (R26.89);Repeated falls (R29.6);Muscle weakness (generalized) (M62.81);Unsteadiness on feet (R26.81)   Activity Tolerance Patient tolerated treatment well   Patient Left in bed;with call bell/phone within reach;with family/visitor present   Nurse Communication          Time: 9629-5284 OT Time Calculation (min): 35 min  Charges: OT General Charges $OT Visit: 1 Visit OT Treatments $Self Care/Home Management : 23-37 mins  Danelle Earthly, MS, OTR/L   Otis Dials 07/30/2023, 12:20 PM

## 2023-07-30 NOTE — Plan of Care (Signed)

## 2023-07-30 NOTE — Progress Notes (Signed)
PROGRESS NOTE    Johnathan Arnold.  ZDG:[id number] DOB: 07-16-1945 DOA: 07/26/2023 PCP: [name], MD  Assessment & Plan:   Principal Problem:   Right lower lobe pneumonia Active Problems:   Essential hypertension   CKD stage 3a, GFR 45-59 ml/min (HCC)   Diverticulitis   Generalized weakness   RSV (respiratory syncytial virus pneumonia)   3-vessel coronary artery disease   Sleep apnea in adult  Assessment and Plan: Right lower lobe pneumonia: possible aspiration pneumonia vs CAP. Continue on IV rocephin, azithromycin, bronchodilators & encourage incentive spirometry   RSV infection: continue w/ supportive care. Droplet precautions   Diverticulitis: mild as per CT. Continue on IV rocephin   CKDIIIa: Cr is trending down from day prior. Avoid nephrotoxic meds    HTN: continue on hydralazine, imdur   Generalized weakness: PT/OT recs outpatient therapy    Hx of CAD: w/ hx of CABG. Continue on statin, imdur    OSA: unclear if pt uses CPAP at home    Bicytopenia: likely secondary to viral illness. Platelets are back WNL today. Will continue to monitor       DVT prophylaxis: lovenox  Code Status: full  Family Communication: discussed pt's care w/ pt's wife, Johnathan Arnold, and answered her questions. Johnathan Arnold has expressive aphasia.  Disposition Plan: likely d/c back home  Level of care: Med-Surg  Status is: Inpatient Remains inpatient appropriate because: severity of illness, will d/c home tomorrow if pt remains stable but pt's wife is not in agreement w/ this     Consultants:    Procedures:   Antimicrobials: rocephin    Subjective: Pt c/o fatigue   Objective: Vitals:   07/29/23 1944 07/30/23 0436 07/30/23 0538 07/30/23 0747  BP: (!) 174/67 (!) 193/67 (!) 180/51 (!) 176/63  Pulse: 63 (!) 57 (!) 54 (!) 58  Resp: 20 16  16   Temp: 98 F (36.7 C) 98 F (36.7 C)  98.3 F (36.8 C)  TempSrc: Oral Oral  Oral  SpO2: 95% 98%  97%  Weight:      Height:         Intake/Output Summary (Last 24 hours) at 07/30/2023 0922 Last data filed at 07/30/2023 5956 Gross per 24 hour  Intake 830 ml  Output 1175 ml  Net -345 ml   Filed Weights   07/26/23 0839  Weight: 74 kg    Examination:  General exam: appears comfortable  Respiratory system: decreased breath sounds b/l  Cardiovascular system: S1/S2+. No rubs or clicks  Gastrointestinal system: abd is soft, NT, ND & hypoactive bowel sounds  Central nervous system: alert & oriented. Moves all extremities  Psychiatry: Judgement and insight appears at baseline. Flat mood and affect     Data Reviewed: I have personally reviewed following labs and imaging studies  CBC: Recent Labs  Lab 07/26/23 0847 07/27/23 0543 07/30/23 0527  WBC 3.5* 2.9* 3.3*  NEUTROABS 2.5  --   --   HGB 12.5* 11.0* 10.4*  HCT 36.0* 31.8* 30.0*  MCV 99.2 101.0* 97.4  PLT 135* 127* 162   Basic Metabolic Panel: Recent Labs  Lab 07/26/23 0847 07/27/23 0543 07/30/23 0527  NA 135 135 142  K 4.1 3.8 3.4*  CL 99 101 108  CO2 24 21* 25  GLUCOSE 93 89 100*  BUN 19 22 10   CREATININE 1.51* 1.50* 1.08  CALCIUM 9.4 8.4* 8.4*   GFR: Estimated Creatinine Clearance: 50.9 mL/min (by C-G formula based on SCr of 1.08 mg/dL). Liver Function Tests:  Recent Labs  Lab 07/26/23 0847 07/27/23 0543  AST 30 27  ALT 22 23  ALKPHOS 40 34*  BILITOT 1.5* 1.1  PROT 6.9 6.0*  ALBUMIN 3.9 3.2*   No results for input(s): "LIPASE", "AMYLASE" in the last 168 hours. No results for input(s): "AMMONIA" in the last 168 hours. Coagulation Profile: No results for input(s): "INR", "PROTIME" in the last 168 hours. Cardiac Enzymes: Recent Labs  Lab 07/26/23 0847  CKTOTAL 317   BNP (last 3 results) No results for input(s): "PROBNP" in the last 8760 hours. HbA1C: No results for input(s): "HGBA1C" in the last 72 hours. CBG: No results for input(s): "GLUCAP" in the last 168 hours. Lipid Profile: No results for input(s): "CHOL",  "HDL", "LDLCALC", "TRIG", "CHOLHDL", "LDLDIRECT" in the last 72 hours. Thyroid Function Tests: No results for input(s): "TSH", "T4TOTAL", "FREET4", "T3FREE", "THYROIDAB" in the last 72 hours. Anemia Panel: No results for input(s): "VITAMINB12", "FOLATE", "FERRITIN", "TIBC", "IRON", "RETICCTPCT" in the last 72 hours. Sepsis Labs: Recent Labs  Lab 07/26/23 0847 07/26/23 1125 07/29/23 0928  PROCALCITON  --   --  <0.10  LATICACIDVEN 1.1 0.9  --     Recent Results (from the past 240 hours)  Resp panel by RT-PCR (RSV, Flu A&B, Covid) Anterior Nasal Swab     Status: Abnormal   Collection Time: 07/26/23  8:48 AM   Specimen: Anterior Nasal Swab  Result Value Ref Range Status   SARS Coronavirus 2 by RT PCR NEGATIVE NEGATIVE Final    Comment: (NOTE) SARS-CoV-2 target nucleic acids are NOT DETECTED.  The SARS-CoV-2 RNA is generally detectable in upper respiratory specimens during the acute phase of infection. The lowest concentration of SARS-CoV-2 viral copies this assay can detect is 138 copies/mL. A negative result does not preclude SARS-Cov-2 infection and should not be used as the sole basis for treatment or other patient management decisions. A negative result may occur with  improper specimen collection/handling, submission of specimen other than nasopharyngeal swab, presence of viral mutation(s) within the areas targeted by this assay, and inadequate number of viral copies(<138 copies/mL). A negative result must be combined with clinical observations, patient history, and epidemiological information. The expected result is Negative.  Fact Sheet for Patients:  BloggerCourse.com  Fact Sheet for Healthcare Providers:  SeriousBroker.it  This test is no t yet approved or cleared by the [place] FDA and  has been authorized for detection and/or diagnosis of SARS-CoV-2 by FDA under an Emergency Use Authorization (EUA). This EUA  will remain  in effect (meaning this test can be used) for the duration of the COVID-19 declaration under Section 564(b)(1) of the Act, 21 U.S.C.section 360bbb-3(b)(1), unless the authorization is terminated  or revoked sooner.       Influenza A by PCR NEGATIVE NEGATIVE Final   Influenza B by PCR NEGATIVE NEGATIVE Final    Comment: (NOTE) The Xpert Xpress SARS-CoV-2/FLU/RSV plus assay is intended as an aid in the diagnosis of influenza from Nasopharyngeal swab specimens and should not be used as a sole basis for treatment. Nasal washings and aspirates are unacceptable for Xpert Xpress SARS-CoV-2/FLU/RSV testing.  Fact Sheet for Patients: BloggerCourse.com  Fact Sheet for Healthcare Providers: SeriousBroker.it  This test is not yet approved or cleared by the [place] FDA and has been authorized for detection and/or diagnosis of SARS-CoV-2 by FDA under an Emergency Use Authorization (EUA). This EUA will remain in effect (meaning this test can be used) for the duration of the COVID-19 declaration under Section  564(b)(1) of the Act, 21 U.S.C. section 360bbb-3(b)(1), unless the authorization is terminated or revoked.     Resp Syncytial Virus by PCR POSITIVE (A) NEGATIVE Final    Comment: (NOTE) Fact Sheet for Patients: BloggerCourse.com  Fact Sheet for Healthcare Providers: SeriousBroker.it  This test is not yet approved or cleared by the [place] FDA and has been authorized for detection and/or diagnosis of SARS-CoV-2 by FDA under an Emergency Use Authorization (EUA). This EUA will remain in effect (meaning this test can be used) for the duration of the COVID-19 declaration under Section 564(b)(1) of the Act, 21 U.S.C. section 360bbb-3(b)(1), unless the authorization is terminated or revoked.  Performed at [facility], [address] [place], [place]  [address]          Radiology Studies: No results found.       Scheduled Meds:  aspirin EC  81 mg Oral QHS   carbidopa-levodopa  2 tablet Oral QID   enoxaparin (LOVENOX) injection  40 mg Subcutaneous Q24H   guaiFENesin  1,200 mg Oral BID   isosorbide mononitrate  60 mg Oral Daily   pantoprazole  40 mg Oral Daily   rosuvastatin  10 mg Oral Daily   tamsulosin  0.4 mg Oral QPC supper   Continuous Infusions:  azithromycin 500 mg (07/29/23 1038)   cefTRIAXone (ROCEPHIN)  IV 1 g (07/29/23 1428)     LOS: 4 days     Charise Killian, MD [facility] Pager [phone]  If 7PM-7AM, please contact night-coverage [email or web address] 07/30/2023, 9:22 AM

## 2023-07-31 DIAGNOSIS — J189 Pneumonia, unspecified organism: Secondary | ICD-10-CM | POA: Diagnosis not present

## 2023-07-31 LAB — CBC
HCT: 29.4 % — ABNORMAL LOW (ref 39.0–52.0)
Hemoglobin: 10.2 g/dL — ABNORMAL LOW (ref 13.0–17.0)
MCH: 33.8 pg (ref 26.0–34.0)
MCHC: 34.7 g/dL (ref 30.0–36.0)
MCV: 97.4 fL (ref 80.0–100.0)
Platelets: 181 10*3/uL (ref 150–400)
RBC: 3.02 MIL/uL — ABNORMAL LOW (ref 4.22–5.81)
RDW: 11.9 % (ref 11.5–15.5)
WBC: 3.7 10*3/uL — ABNORMAL LOW (ref 4.0–10.5)
nRBC: 0 % (ref 0.0–0.2)

## 2023-07-31 LAB — BASIC METABOLIC PANEL
Anion gap: 8 (ref 5–15)
BUN: 7 mg/dL — ABNORMAL LOW (ref 8–23)
CO2: 24 mmol/L (ref 22–32)
Calcium: 8.5 mg/dL — ABNORMAL LOW (ref 8.9–10.3)
Chloride: 110 mmol/L (ref 98–111)
Creatinine, Ser: 1.11 mg/dL (ref 0.61–1.24)
GFR, Estimated: >60 mL/min (ref >60)
Glucose, Bld: 93 mg/dL (ref 70–99)
Potassium: 3.5 mmol/L (ref 3.5–5.1)
Sodium: 142 mmol/L (ref 135–145)

## 2023-07-31 MED ORDER — HYDROCORTISONE 1 % EX CREA
TOPICAL_CREAM | Freq: Two times a day (BID) | CUTANEOUS | Status: DC
  Filled 2023-07-31: qty 28

## 2023-07-31 MED ORDER — HYDROCOD POLI-CHLORPHE POLI ER 10-8 MG/5ML PO SUER: 5.0000 mL | Freq: Two times a day (BID) | ORAL | 0 refills | Status: DC | PRN

## 2023-07-31 MED ORDER — HYDROCORTISONE 1 % EX CREA: TOPICAL_CREAM | Freq: Two times a day (BID) | CUTANEOUS | Status: DC

## 2023-07-31 MED ORDER — AZITHROMYCIN 250 MG PO TABS
250.0000 mg | ORAL_TABLET | Freq: Every day | ORAL | 0 refills | Status: AC
Start: 2023-07-31 — End: 2023-08-02

## 2023-07-31 NOTE — Discharge Summary (Signed)
Physician Discharge Summary  Johnathan Arnold. ZOX:[id number] DOB: 01/17/1946 DOA: 07/26/2023  PCP: [name], MD  Admit date: 07/26/2023 Discharge date: 07/31/2023  Admitted From: home  Disposition:  home   Recommendations for Outpatient Follow-up:  Follow up with PCP in 1-2 weeks Will need to get a repeat CXR in 4-6 weeks to confirm resolution of pneumonia    Home Health: no  Equipment/Devices:  Discharge Condition: stable  CODE STATUS: full  Diet recommendation: Heart Healthy / Carb Modified  Brief/Interim Summary: HPI was taken from Dr. Alvester Morin:  Johnathan Arnold. is a 78 y.o. male with medical history significant of CAD status post CABG, type 2 diabetes, hyperlipidemia, hypertension, Parkinson's disease presenting with weakness, diverticulitis, RSV.  Limited history in the setting of generalized Parkinson disease and mild encephalopathy.  Per report, EMS called to home for worsening generalized weakness.  Patient is been sick x 5 days and unable to get out of bed.  Noted baseline general weakness in the setting of Parkinson disease.  Last admission January 23 through February 3 for similar issues with predominant concern for Parkinson's and medical noncompliance.  No reported fevers or chills.  Per report patient with?  Urinary symptoms.  Patient denies any active dysuria or increased urinary frequency.  Minimal abdominal pain. Presented to the ER Tmax 99.3, hemodynamically stable.  Satting well on room air.  White count 3.5, hemoglobin 12.5, platelets 135, lactate 0.9, troponin in the 20s, urinalysis not indicative of infection.  RSV positive.  Creatinine 1.5.  CT abdomen pelvis with noted acute diverticulitis of the sigmoid colon.  Masslike area in the right lower lobe concerning for potential neoplasm.  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 Continue on IV rocephin, azithromycin, bronchodilators & encourage incentive spirometry. D/c home w/ azithromycin to complete course    RSV infection: continue w/ supportive care. Droplet precautions    Diverticulitis: mild as per CT. Completed abx course    CKDIIIa: Cr is labile. Avoid nephrotoxic meds    HTN: continue on hydralazine, imdur    Generalized weakness: PT/OT recs outpatient therapy    Hx of CAD: w/ hx of CABG. Continue on statin, imdur    OSA: unclear if pt uses CPAP at home    Bicytopenia: likely secondary to viral illness. Platelets are back WNL. WBC is trending up and almost WNL. Will continue to monitor   DM2: well controlled, HbA1c 5.9 in April 2024 & it 6.7 in 2013. Diet controlled   Discharge Instructions  Discharge Instructions     Diet - low sodium heart healthy   Complete by: As directed    Discharge instructions   Complete by: As directed    F/u w/ PCP in 1-2 weeks. Get a repeat CXR in 4-6 weeks outpatient to confirm the resolution of the pneumonia. Your PCP can order this.   Increase activity slowly   Complete by: As directed       Allergies as of 07/31/2023       Reactions   Paxlovid [nirmatrelvir-ritonavir]         Medication List     TAKE these medications    acetaminophen 500  MG tablet Commonly known as: TYLENOL Take 500 mg by mouth as needed.   aspirin EC 81 MG tablet Take 1 tablet (81 mg total) by mouth at bedtime.   azithromycin 250 MG tablet Commonly known as: Zithromax Take 1 tablet (250 mg total) by mouth daily for 2 days.   carbidopa-levodopa 25-100 MG tablet Commonly known as: SINEMET IR TAKE 2 TABLETS BY MOUTH AT 8AM; TAKE 2 TABLETS BY MOUTH AT 11AM; TAKE 2 TABLETS BY MOUTH AT 2PM.; TAKE 1 TABLET BY MOUTH AT 5PM. What  changed: Another medication with the same name was removed. Continue taking this medication, and follow the directions you see here.   chlorpheniramine-HYDROcodone 10-8 MG/5ML Commonly known as: TUSSIONEX Take 5 mLs by mouth every 12 (twelve) hours as needed for up to 14 days for cough.   clonazePAM 1 MG tablet Commonly known as: KLONOPIN Take 1 mg by mouth at bedtime as needed.   furosemide 20 MG tablet Commonly known as: LASIX Take 1 tablet (20 mg total) by mouth daily as needed (for swelling).   hydrALAZINE 50 MG tablet Commonly known as: APRESOLINE Take 1 tablet (50 mg total) by mouth every 6 (six) hours as needed (SBP >170 or DBP >100).   hydrocortisone cream 1 % Apply topically 2 (two) times daily.   isosorbide mononitrate 60 MG 24 hr tablet Commonly known as: IMDUR TAKE 1 TABLET BY MOUTH DAILY   melatonin 3 MG Tabs tablet Take 1.5 mg by mouth at bedtime as needed.   multivitamin tablet Take 1 tablet by mouth daily.   nitroGLYCERIN 0.4 MG SL tablet Commonly known as: NITROSTAT Place 1 tablet (0.4 mg total) under the tongue every 5 (five) minutes as needed for chest pain.   omeprazole 20 MG capsule Commonly known as: PRILOSEC Take 1 capsule (20 mg total) by mouth every morning.   ondansetron 4 MG tablet Commonly known as: ZOFRAN Take 1 tablet (4 mg total) by mouth every 6 (six) hours as needed for nausea or vomiting.   polyethylene glycol 17 g packet Commonly known as: MIRALAX / GLYCOLAX Take 17 g by mouth daily as needed.   ranolazine 1000 MG SR tablet Commonly known as: RANEXA TAKE 1 TABLET BY MOUTH 2 TIMES A DAY   rosuvastatin 10 MG tablet Commonly known as: CRESTOR TAKE 1 TABLET BY MOUTH DAILY   tamsulosin 0.4 MG Caps capsule Commonly known as: FLOMAX TAKE ONE CAPSULE BY MOUTH DAILY        Follow-up Information     [name], MD Follow up.   Specialty: Internal Medicine Why: F/u in 1-2 weeks Contact information: [address] [place] [address] [phone]                Allergies  Allergen Reactions   Paxlovid [Nirmatrelvir-Ritonavir]     Consultations:    Procedures/Studies: CT CHEST W CONTRAST Result Date: 07/27/2023 CLINICAL DATA:  Concern for a right lower lobe pleural mass. EXAM: CT CHEST WITH CONTRAST TECHNIQUE: Multidetector CT imaging of the chest was performed during intravenous contrast administration. RADIATION DOSE REDUCTION: This exam was performed according to the departmental dose-optimization program which includes automated exposure control, adjustment of the mA and/or kV according to patient size and/or use of iterative reconstruction technique. CONTRAST:  75mL OMNIPAQUE IOHEXOL 300 MG/ML  SOLN COMPARISON:  CT abdomen pelvis dated 07/26/2023 and chest CT dated 05/07/2021. FINDINGS: Cardiovascular: There is no cardiomegaly or pericardial effusion. Advanced coronary vascular calcification and postsurgical changes of  CABG. Mild atherosclerotic calcification of the thoracic aorta. No intimal dilatation or dissection. The origins of the great vessels of the aortic arch appear patent. No pulmonary artery embolus identified. Mediastinum/Nodes: No hilar adenopathy. Enlarged subcarinal lymph node measures 18 mm short axis. The esophagus is grossly unremarkable. Subcentimeter right thyroid hypodense nodule. Not clinically significant; no follow-up imaging recommended (ref: J Am Coll Radiol. 2015 Feb;12(2): 143-50).No mediastinal fluid collection. Lungs/Pleura: Small bilateral pleural effusions with minimal compressive atelectasis of the adjacent lungs. There is a cluster of ground-glass airspace opacity in the right lower lobe and right lung base consistent with pneumonia. Aspiration is not excluded. Clinical correlation and follow-up to resolution recommended. There is no pneumothorax. The central airways are patent. Upper Abdomen: Small gallstones. Musculoskeletal: Osteopenia with  degenerative changes of the spine. Median sternotomy wires. No acute osseous pathology. IMPRESSION: 1. Right lower lobe pneumonia. Clinical correlation and follow-up to resolution recommended. 2. Small bilateral pleural effusions. 3. Cholelithiasis. 4.  Aortic Atherosclerosis (ICD10-I70.0). Electronically Signed   By: Elgie Collard M.D.   On: 07/27/2023 19:02   CT ABDOMEN PELVIS W CONTRAST Result Date: 07/26/2023 CLINICAL DATA:  78 year old male with history of left lower quadrant abdominal pain. Generalized weakness. EXAM: CT ABDOMEN AND PELVIS WITH CONTRAST TECHNIQUE: Multidetector CT imaging of the abdomen and pelvis was performed using the standard protocol following bolus administration of intravenous contrast. RADIATION DOSE REDUCTION: This exam was performed according to the departmental dose-optimization program which includes automated exposure control, adjustment of the mA and/or kV according to patient size and/or use of iterative reconstruction technique. CONTRAST:  75mL OMNIPAQUE IOHEXOL 300 MG/ML  SOLN COMPARISON:  CT of the abdomen and pelvis 11/21/2020. FINDINGS: Lower chest: Mass-like area in the right lower lobe (axial image 4 of series 4) measuring 3.2 x 2.4 cm with some surrounding ground-glass attenuation and septal thickening. Small bilateral pleural effusions lying dependently. Atherosclerotic calcifications are noted in the left anterior descending and right coronary arteries. Status post median sternotomy. Hepatobiliary: No suspicious cystic or solid hepatic lesions. No intra or extrahepatic biliary ductal dilatation. Tiny noncalcified gallstones are noted lying dependently in the gallbladder. Gallbladder is moderately distended. No definite pericholecystic fluid or surrounding inflammatory changes. Pancreas: No pancreatic mass. No pancreatic ductal dilatation. No pancreatic or peripancreatic fluid collections or inflammatory changes. Spleen: Unremarkable. Adrenals/Urinary Tract: Tiny  subcentimeter low-attenuation lesions in the right kidney, too small to definitively characterize, but statistically likely tiny cysts (no imaging follow-up recommended). Left kidney and bilateral adrenal glands are otherwise normal in appearance. No hydroureteronephrosis. Urinary bladder is unremarkable in appearance. Stomach/Bowel: The appearance of the stomach is normal. No pathologic dilatation of small bowel or colon. Numerous colonic diverticuli are noted. Trace volume of fluid and minimal fat stranding adjacent to diverticuli in the region of the sigmoid colon, which could indicate very mild acute diverticulitis. No definite diverticular abscess or signs of frank perforation are noted at this time. Normal appendix. Vascular/Lymphatic: Atherosclerosis in the abdominal aorta and pelvic vasculature, without evidence of aneurysm or dissection. No lymphadenopathy noted in the abdomen or pelvis. Reproductive: Prostate gland and seminal vesicles are unremarkable in appearance. Other: Trace volume of ascites.  No pneumoperitoneum. Musculoskeletal: There are no aggressive appearing lytic or blastic lesions noted in the visualized portions of the skeleton. IMPRESSION: 1. There are imaging findings which could suggest very mild acute diverticulitis in the sigmoid colon. No diverticular abscess or signs of frank perforation are noted at this time. 2. Mass-like area in the right lower lobe concerning  for potential neoplasm. Further evaluation with dedicated contrast-enhanced chest CT should be considered in the near future to better evaluate these findings. 3. Small bilateral pleural effusions lying dependently. 4. Cholelithiasis without evidence of acute cholecystitis. 5. Aortic atherosclerosis, in addition to at least 2 vessel coronary artery disease. Aortic Atherosclerosis (ICD10-I70.0). Electronically Signed   By: Trudie Reed M.D.   On: 07/26/2023 10:34   DG Chest 2 View Result Date: 07/26/2023 CLINICAL DATA:   78 year old male with history of generalized weakness and altered mental status. EXAM: CHEST - 2 VIEW COMPARISON:  Chest x-ray 07/02/2023. FINDINGS: Lung volumes are normal. No consolidative airspace disease. No pleural effusions. No pneumothorax. No pulmonary nodule or mass noted. Pulmonary vasculature and the cardiomediastinal silhouette are within normal limits. Atherosclerotic calcifications are noted in the thoracic aorta. Status post median sternotomy for CABG. IMPRESSION: 1.  No radiographic evidence of acute cardiopulmonary disease. 2. Aortic atherosclerosis. Electronically Signed   By: Trudie Reed M.D.   On: 07/26/2023 09:39   DG Chest 2 View Result Date: 07/02/2023 CLINICAL DATA:  Weakness. EXAM: CHEST - 2 VIEW COMPARISON:  Chest radiograph dated 07/06/2021. FINDINGS: No focal consolidation, pleural effusion, or pneumothorax. Stable cardiac silhouette. Median sternotomy wires and CABG vascular clips. No acute osseous pathology. IMPRESSION: No active cardiopulmonary disease. Electronically Signed   By: Elgie Collard M.D.   On: 07/02/2023 12:31   (Echo, Carotid, EGD, Colonoscopy, ERCP)    Subjective: Pt c/o malaise    Discharge Exam: Vitals:   07/31/23 0447 07/31/23 0852  BP: (!) 172/61 (!) 156/59  Pulse: 67 66  Resp: 18 18  Temp: 98.2 F (36.8 C) 98 F (36.7 C)  SpO2:  99%   Vitals:   07/30/23 1500 07/30/23 1947 07/31/23 0447 07/31/23 0852  BP: 139/61 (!) 172/61 (!) 172/61 (!) 156/59  Pulse: 62 61 67 66  Resp: 14 18 18 18   Temp: 98 F (36.7 C) 98.7 F (37.1 C) 98.2 F (36.8 C) 98 F (36.7 C)  TempSrc: Oral Oral Oral   SpO2: 100%   99%  Weight:      Height:        General: Pt is alert, awake, not in acute distress Cardiovascular: S1/S2 +, no rubs, no gallops Respiratory: CTA bilaterally, no wheezing, no rhonchi Abdominal: Soft, NT, ND, bowel sounds + Extremities: no cyanosis    The results of significant diagnostics from this hospitalization (including  imaging, microbiology, ancillary and laboratory) are listed below for reference.     Microbiology: Recent Results (from the past 240 hours)  Resp panel by RT-PCR (RSV, Flu A&B, Covid) Anterior Nasal Swab     Status: Abnormal   Collection Time: 07/26/23  8:48 AM   Specimen: Anterior Nasal Swab  Result Value Ref Range Status   SARS Coronavirus 2 by RT PCR NEGATIVE NEGATIVE Final    Comment: (NOTE) SARS-CoV-2 target nucleic acids are NOT DETECTED.  The SARS-CoV-2 RNA is generally detectable in upper respiratory specimens during the acute phase of infection. The lowest concentration of SARS-CoV-2 viral copies this assay can detect is 138 copies/mL. A negative result does not preclude SARS-Cov-2 infection and should not be used as the sole basis for treatment or other patient management decisions. A negative result may occur with  improper specimen collection/handling, submission of specimen other than nasopharyngeal swab, presence of viral mutation(s) within the areas targeted by this assay, and inadequate number of viral copies(<138 copies/mL). A negative result must be combined with clinical observations, patient history, and  epidemiological information. The expected result is Negative.  Fact Sheet for Patients:  BloggerCourse.com  Fact Sheet for Healthcare Providers:  SeriousBroker.it  This test is no t yet approved or cleared by the [place] FDA and  has been authorized for detection and/or diagnosis of SARS-CoV-2 by FDA under an Emergency Use Authorization (EUA). This EUA will remain  in effect (meaning this test can be used) for the duration of the COVID-19 declaration under Section 564(b)(1) of the Act, 21 U.S.C.section 360bbb-3(b)(1), unless the authorization is terminated  or revoked sooner.       Influenza A by PCR NEGATIVE NEGATIVE Final   Influenza B by PCR NEGATIVE NEGATIVE Final    Comment: (NOTE) The Xpert  Xpress SARS-CoV-2/FLU/RSV plus assay is intended as an aid in the diagnosis of influenza from Nasopharyngeal swab specimens and should not be used as a sole basis for treatment. Nasal washings and aspirates are unacceptable for Xpert Xpress SARS-CoV-2/FLU/RSV testing.  Fact Sheet for Patients: BloggerCourse.com  Fact Sheet for Healthcare Providers: SeriousBroker.it  This test is not yet approved or cleared by the [place] FDA and has been authorized for detection and/or diagnosis of SARS-CoV-2 by FDA under an Emergency Use Authorization (EUA). This EUA will remain in effect (meaning this test can be used) for the duration of the COVID-19 declaration under Section 564(b)(1) of the Act, 21 U.S.C. section 360bbb-3(b)(1), unless the authorization is terminated or revoked.     Resp Syncytial Virus by PCR POSITIVE (A) NEGATIVE Final    Comment: (NOTE) Fact Sheet for Patients: BloggerCourse.com  Fact Sheet for Healthcare Providers: SeriousBroker.it  This test is not yet approved or cleared by the [place] FDA and has been authorized for detection and/or diagnosis of SARS-CoV-2 by FDA under an Emergency Use Authorization (EUA). This EUA will remain in effect (meaning this test can be used) for the duration of the COVID-19 declaration under Section 564(b)(1) of the Act, 21 U.S.C. section 360bbb-3(b)(1), unless the authorization is terminated or revoked.  Performed at [facility], [address] [place], [place] [address]      Labs: BNP (last 3 results) No results for input(s): "BNP" in the last 8760 hours. Basic Metabolic Panel: Recent Labs  Lab 07/26/23 0847 07/27/23 0543 07/30/23 0527 07/31/23 0543  NA 135 135 142 142  K 4.1 3.8 3.4* 3.5  CL 99 101 108 110  CO2 24 21* 25 24  GLUCOSE 93 89 100* 93  BUN 19 22 10  7*  CREATININE 1.51* 1.50* 1.08 1.11   CALCIUM 9.4 8.4* 8.4* 8.5*   Liver Function Tests: Recent Labs  Lab 07/26/23 0847 07/27/23 0543  AST 30 27  ALT 22 23  ALKPHOS 40 34*  BILITOT 1.5* 1.1  PROT 6.9 6.0*  ALBUMIN 3.9 3.2*   No results for input(s): "LIPASE", "AMYLASE" in the last 168 hours. No results for input(s): "AMMONIA" in the last 168 hours. CBC: Recent Labs  Lab 07/26/23 0847 07/27/23 0543 07/30/23 0527 07/31/23 0543  WBC 3.5* 2.9* 3.3* 3.7*  NEUTROABS 2.5  --   --   --   HGB 12.5* 11.0* 10.4* 10.2*  HCT 36.0* 31.8* 30.0* 29.4*  MCV 99.2 101.0* 97.4 97.4  PLT 135* 127* 162 181   Cardiac Enzymes: Recent Labs  Lab 07/26/23 0847  CKTOTAL 317   BNP: Invalid input(s): "POCBNP" CBG: No results for input(s): "GLUCAP" in the last 168 hours. D-Dimer No results for input(s): "DDIMER" in the last 72 hours. Hgb A1c No results  for input(s): "HGBA1C" in the last 72 hours. Lipid Profile No results for input(s): "CHOL", "HDL", "LDLCALC", "TRIG", "CHOLHDL", "LDLDIRECT" in the last 72 hours. Thyroid function studies No results for input(s): "TSH", "T4TOTAL", "T3FREE", "THYROIDAB" in the last 72 hours.  Invalid input(s): "FREET3" Anemia work up No results for input(s): "VITAMINB12", "FOLATE", "FERRITIN", "TIBC", "IRON", "RETICCTPCT" in the last 72 hours. Urinalysis    Component Value Date/Time   COLORURINE AMBER (A) 07/26/2023 1048   APPEARANCEUR CLEAR (A) 07/26/2023 1048   APPEARANCEUR Clear 04/15/2018 0906   LABSPEC 1.029 07/26/2023 1048   LABSPEC 1.009 03/15/2014 1005   PHURINE 5.0 07/26/2023 1048   GLUCOSEU NEGATIVE 07/26/2023 1048   GLUCOSEU Negative 03/15/2014 1005   HGBUR NEGATIVE 07/26/2023 1048   BILIRUBINUR NEGATIVE 07/26/2023 1048   BILIRUBINUR Negative 04/15/2018 0906   BILIRUBINUR Negative 03/15/2014 1005   KETONESUR 20 (A) 07/26/2023 1048   PROTEINUR >=300 (A) 07/26/2023 1048   UROBILINOGEN 1.0 03/16/2012 1630   NITRITE NEGATIVE 07/26/2023 1048   LEUKOCYTESUR NEGATIVE 07/26/2023  1048   LEUKOCYTESUR Negative 03/15/2014 1005   Sepsis Labs Recent Labs  Lab 07/26/23 0847 07/27/23 0543 07/30/23 0527 07/31/23 0543  WBC 3.5* 2.9* 3.3* 3.7*   Microbiology Recent Results (from the past 240 hours)  Resp panel by RT-PCR (RSV, Flu A&B, Covid) Anterior Nasal Swab     Status: Abnormal   Collection Time: 07/26/23  8:48 AM   Specimen: Anterior Nasal Swab  Result Value Ref Range Status   SARS Coronavirus 2 by RT PCR NEGATIVE NEGATIVE Final    Comment: (NOTE) SARS-CoV-2 target nucleic acids are NOT DETECTED.  The SARS-CoV-2 RNA is generally detectable in upper respiratory specimens during the acute phase of infection. The lowest concentration of SARS-CoV-2 viral copies this assay can detect is 138 copies/mL. A negative result does not preclude SARS-Cov-2 infection and should not be used as the sole basis for treatment or other patient management decisions. A negative result may occur with  improper specimen collection/handling, submission of specimen other than nasopharyngeal swab, presence of viral mutation(s) within the areas targeted by this assay, and inadequate number of viral copies(<138 copies/mL). A negative result must be combined with clinical observations, patient history, and epidemiological information. The expected result is Negative.  Fact Sheet for Patients:  BloggerCourse.com  Fact Sheet for Healthcare Providers:  SeriousBroker.it  This test is no t yet approved or cleared by the [place] FDA and  has been authorized for detection and/or diagnosis of SARS-CoV-2 by FDA under an Emergency Use Authorization (EUA). This EUA will remain  in effect (meaning this test can be used) for the duration of the COVID-19 declaration under Section 564(b)(1) of the Act, 21 U.S.C.section 360bbb-3(b)(1), unless the authorization is terminated  or revoked sooner.       Influenza A by PCR NEGATIVE NEGATIVE  Final   Influenza B by PCR NEGATIVE NEGATIVE Final    Comment: (NOTE) The Xpert Xpress SARS-CoV-2/FLU/RSV plus assay is intended as an aid in the diagnosis of influenza from Nasopharyngeal swab specimens and should not be used as a sole basis for treatment. Nasal washings and aspirates are unacceptable for Xpert Xpress SARS-CoV-2/FLU/RSV testing.  Fact Sheet for Patients: BloggerCourse.com  Fact Sheet for Healthcare Providers: SeriousBroker.it  This test is not yet approved or cleared by the [place] FDA and has been authorized for detection and/or diagnosis of SARS-CoV-2 by FDA under an Emergency Use Authorization (EUA). This EUA will remain in effect (meaning this test can be used) for  the duration of the COVID-19 declaration under Section 564(b)(1) of the Act, 21 U.S.C. section 360bbb-3(b)(1), unless the authorization is terminated or revoked.     Resp Syncytial Virus by PCR POSITIVE (A) NEGATIVE Final    Comment: (NOTE) Fact Sheet for Patients: BloggerCourse.com  Fact Sheet for Healthcare Providers: SeriousBroker.it  This test is not yet approved or cleared by the [place] FDA and has been authorized for detection and/or diagnosis of SARS-CoV-2 by FDA under an Emergency Use Authorization (EUA). This EUA will remain in effect (meaning this test can be used) for the duration of the COVID-19 declaration under Section 564(b)(1) of the Act, 21 U.S.C. section 360bbb-3(b)(1), unless the authorization is terminated or revoked.  Performed at [facility], [address], [place], [place] [address]      Time coordinating discharge: Over 30 minutes  SIGNED:   Charise Killian, MD  [facility] 07/31/2023, 12:14 PM Pager   If 7PM-7AM, please contact night-coverage [email or web address]

## 2023-07-31 NOTE — Plan of Care (Signed)

## 2023-08-03 ENCOUNTER — Telehealth: Payer: Self-pay

## 2023-08-03 ENCOUNTER — Other Ambulatory Visit: Payer: Self-pay | Admitting: Neurology

## 2023-08-03 ENCOUNTER — Ambulatory Visit: Payer: Medicare Other | Admitting: Physical Therapy

## 2023-08-03 ENCOUNTER — Inpatient Hospital Stay: Payer: Medicare Other | Admitting: Family Medicine

## 2023-08-03 ENCOUNTER — Encounter: Payer: Self-pay | Admitting: Internal Medicine

## 2023-08-03 DIAGNOSIS — G20A1 Parkinson's disease without dyskinesia, without mention of fluctuations: Secondary | ICD-10-CM

## 2023-08-03 NOTE — Transitions of Care (Post Inpatient/ED Visit) (Signed)
   08/03/2023  Name: Johnathan Arnold. MRN: [id number] DOB: 1946-01-15  Today's TOC FU Call Status: Today's TOC FU Call Status:: Unsuccessful Call (1st Attempt) Unsuccessful Call (1st Attempt) Date: 08/03/23  Attempted to reach the patient regarding the most recent Inpatient/ED visit.  Follow Up Plan: Additional outreach attempts will be made to reach the patient to complete the Transitions of Care (Post Inpatient/ED visit) call.   Deidre Ala, BSN, RN [place]  [facility] - [employer] [facility] RN Care Manager [phone]

## 2023-08-03 NOTE — Telephone Encounter (Signed)
 Copied from CRM [id number]. Topic: Clinical - Medical Advice >> Aug 03, 2023 11:37 AM Isabell A wrote: Reason for CRM: Patients son Refujio Haymer is requesting a call from the PCP Dr.Tullo, states he has questions in regard to the pt going in and out of the hospital.   Callback number: [phone]

## 2023-08-04 ENCOUNTER — Other Ambulatory Visit: Payer: Self-pay

## 2023-08-04 ENCOUNTER — Emergency Department: Payer: Medicare Other

## 2023-08-04 ENCOUNTER — Encounter: Payer: Self-pay | Admitting: *Deleted

## 2023-08-04 ENCOUNTER — Telehealth: Payer: Self-pay | Admitting: *Deleted

## 2023-08-04 ENCOUNTER — Observation Stay
Admission: EM | Admit: 2023-08-04 | Discharge: 2023-08-07 | Disposition: A | Discharge status: Skilled Nursing Facility | Payer: Medicare Other | Attending: Hospitalist | Admitting: Hospitalist

## 2023-08-04 DIAGNOSIS — J90 Pleural effusion, not elsewhere classified: Secondary | ICD-10-CM | POA: Insufficient documentation

## 2023-08-04 DIAGNOSIS — K5730 Diverticulosis of large intestine without perforation or abscess without bleeding: Secondary | ICD-10-CM | POA: Diagnosis not present

## 2023-08-04 DIAGNOSIS — G20C Parkinsonism, unspecified: Secondary | ICD-10-CM | POA: Diagnosis not present

## 2023-08-04 DIAGNOSIS — Z7982 Long term (current) use of aspirin: Secondary | ICD-10-CM | POA: Diagnosis not present

## 2023-08-04 DIAGNOSIS — I10 Essential (primary) hypertension: Secondary | ICD-10-CM | POA: Diagnosis not present

## 2023-08-04 DIAGNOSIS — B338 Other specified viral diseases: Secondary | ICD-10-CM | POA: Diagnosis not present

## 2023-08-04 DIAGNOSIS — I2510 Atherosclerotic heart disease of native coronary artery without angina pectoris: Secondary | ICD-10-CM | POA: Insufficient documentation

## 2023-08-04 DIAGNOSIS — N401 Enlarged prostate with lower urinary tract symptoms: Secondary | ICD-10-CM

## 2023-08-04 DIAGNOSIS — I129 Hypertensive chronic kidney disease with stage 1 through stage 4 chronic kidney disease, or unspecified chronic kidney disease: Secondary | ICD-10-CM | POA: Diagnosis not present

## 2023-08-04 DIAGNOSIS — I6523 Occlusion and stenosis of bilateral carotid arteries: Secondary | ICD-10-CM | POA: Diagnosis not present

## 2023-08-04 DIAGNOSIS — R531 Weakness: Secondary | ICD-10-CM | POA: Diagnosis not present

## 2023-08-04 DIAGNOSIS — B974 Respiratory syncytial virus as the cause of diseases classified elsewhere: Secondary | ICD-10-CM | POA: Diagnosis not present

## 2023-08-04 DIAGNOSIS — G20A1 Parkinson's disease without dyskinesia, without mention of fluctuations: Secondary | ICD-10-CM | POA: Diagnosis present

## 2023-08-04 DIAGNOSIS — M6281 Muscle weakness (generalized): Principal | ICD-10-CM | POA: Insufficient documentation

## 2023-08-04 DIAGNOSIS — D175 Benign lipomatous neoplasm of intra-abdominal organs: Secondary | ICD-10-CM | POA: Insufficient documentation

## 2023-08-04 DIAGNOSIS — K5790 Diverticulosis of intestine, part unspecified, without perforation or abscess without bleeding: Secondary | ICD-10-CM | POA: Insufficient documentation

## 2023-08-04 DIAGNOSIS — I700 Atherosclerosis of aorta: Secondary | ICD-10-CM | POA: Insufficient documentation

## 2023-08-04 DIAGNOSIS — G9340 Encephalopathy, unspecified: Secondary | ICD-10-CM | POA: Diagnosis not present

## 2023-08-04 DIAGNOSIS — N1831 Chronic kidney disease, stage 3a: Secondary | ICD-10-CM | POA: Diagnosis not present

## 2023-08-04 DIAGNOSIS — Z951 Presence of aortocoronary bypass graft: Secondary | ICD-10-CM

## 2023-08-04 DIAGNOSIS — R590 Localized enlarged lymph nodes: Secondary | ICD-10-CM | POA: Diagnosis not present

## 2023-08-04 DIAGNOSIS — Z1152 Encounter for screening for COVID-19: Secondary | ICD-10-CM | POA: Insufficient documentation

## 2023-08-04 DIAGNOSIS — E1122 Type 2 diabetes mellitus with diabetic chronic kidney disease: Secondary | ICD-10-CM | POA: Insufficient documentation

## 2023-08-04 DIAGNOSIS — E041 Nontoxic single thyroid nodule: Secondary | ICD-10-CM | POA: Diagnosis not present

## 2023-08-04 DIAGNOSIS — J121 Respiratory syncytial virus pneumonia: Secondary | ICD-10-CM | POA: Diagnosis present

## 2023-08-04 DIAGNOSIS — R4182 Altered mental status, unspecified: Secondary | ICD-10-CM | POA: Diagnosis not present

## 2023-08-04 DIAGNOSIS — I6782 Cerebral ischemia: Secondary | ICD-10-CM | POA: Diagnosis not present

## 2023-08-04 DIAGNOSIS — J189 Pneumonia, unspecified organism: Secondary | ICD-10-CM | POA: Diagnosis not present

## 2023-08-04 DIAGNOSIS — R000 Tachycardia, unspecified: Secondary | ICD-10-CM | POA: Diagnosis not present

## 2023-08-04 LAB — CBC
HCT: 30.6 % — ABNORMAL LOW (ref 39.0–52.0)
Hemoglobin: 10.7 g/dL — ABNORMAL LOW (ref 13.0–17.0)
MCH: 34.4 pg — ABNORMAL HIGH (ref 26.0–34.0)
MCHC: 35 g/dL (ref 30.0–36.0)
MCV: 98.4 fL (ref 80.0–100.0)
Platelets: 296 10*3/uL (ref 150–400)
RBC: 3.11 MIL/uL — ABNORMAL LOW (ref 4.22–5.81)
RDW: 12.2 % (ref 11.5–15.5)
WBC: 4.4 10*3/uL (ref 4.0–10.5)
nRBC: 0 % (ref 0.0–0.2)

## 2023-08-04 LAB — RESP PANEL BY RT-PCR (RSV, FLU A&B, COVID)  RVPGX2
Influenza A by PCR: NEGATIVE
Influenza B by PCR: NEGATIVE
Resp Syncytial Virus by PCR: POSITIVE — AB
SARS Coronavirus 2 by RT PCR: NEGATIVE

## 2023-08-04 LAB — BASIC METABOLIC PANEL
Anion gap: 8 (ref 5–15)
BUN: 11 mg/dL (ref 8–23)
CO2: 25 mmol/L (ref 22–32)
Calcium: 8.9 mg/dL (ref 8.9–10.3)
Chloride: 104 mmol/L (ref 98–111)
Creatinine, Ser: 1.27 mg/dL — ABNORMAL HIGH (ref 0.61–1.24)
GFR, Estimated: 58 mL/min — ABNORMAL LOW (ref >60)
Glucose, Bld: 100 mg/dL — ABNORMAL HIGH (ref 70–99)
Potassium: 4.4 mmol/L (ref 3.5–5.1)
Sodium: 137 mmol/L (ref 135–145)

## 2023-08-04 LAB — TROPONIN I (HIGH SENSITIVITY): Troponin I (High Sensitivity): 8 ng/L (ref <18)

## 2023-08-04 MED ORDER — IOHEXOL 300 MG/ML  SOLN
100.0000 mL | Freq: Once | INTRAMUSCULAR | Status: AC | PRN
  Administered 2023-08-04: 100 mL via INTRAVENOUS

## 2023-08-04 MED ORDER — TAMSULOSIN HCL 0.4 MG PO CAPS: 0.4000 mg | ORAL_CAPSULE | Freq: Every day | ORAL | 3 refills | Status: AC

## 2023-08-04 MED ORDER — SODIUM CHLORIDE 0.9 % IV BOLUS (SEPSIS)
1000.0000 mL | Freq: Once | INTRAVENOUS | Status: AC
  Administered 2023-08-05: 1000 mL via INTRAVENOUS

## 2023-08-04 NOTE — Telephone Encounter (Signed)
 See my chart message

## 2023-08-04 NOTE — ED Provider Notes (Signed)
 [facility] Provider Note    Event Date/Time   First MD Initiated Contact with Patient 08/04/23 2305     (approximate)   History   Weakness   HPI  Johnathan Willems. is a 78 y.o. male with history of CAD status post CABG, hypertension, diabetes, hyperlipidemia, Parkinson's who presents to the emergency department with his son for concerns for generalized weakness.  Send reports he was just in the hospital and discharged on Friday with RSV, pneumonia, diverticulitis.  He states that his father has finished his antibiotics but when he arrived his father had been in the bed again since discharge and has been very weak.  Patient denies to me any pain but is tender when I palpate his abdomen.  No known fevers, cough, vomiting, diarrhea.  Denies chest pain or shortness of breath.  Son reports patient is normally able to ambulate with a walker.  It is unclear if he has been taking his medications.  He is hypertensive here in the ED.   History provided by patient, son.    Past Medical History:  Diagnosis Date   3-vessel coronary artery disease    s/p  5 vessel CABG   Diabetes mellitus without complication (HCC)    History of cardiac catheterization 2011   [facility]   Hyperlipidemia    Hypertension    Hypertriglyceridemia    Parkinson's disease (HCC)    Pneumonia 12/28/2020   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 LIMA to LAD, SVG to D1, OM1 and RPDA   CORONARY ARTERY BYPASS GRAFT  03/1998   5 vessel, [facility]   RIGHT HEART CATH N/A 04/04/2019   Procedure: RIGHT HEART CATH;  Surgeon: Iran Ouch, MD;  Location: [facility] INVASIVE CV LAB;  Service: Cardiovascular;  Laterality: N/A;   RIGHT/LEFT HEART CATH AND CORONARY ANGIOGRAPHY N/A 02/01/2018   Procedure: RIGHT/LEFT HEART CATH AND CORONARY ANGIOGRAPHY;  Surgeon: Iran Ouch, MD;  Location:  [facility] INVASIVE CV LAB;  Service: Cardiovascular;  Laterality: N/A;    MEDICATIONS:  Prior to Admission medications   Medication Sig Start Date End Date Taking? Authorizing Provider  acetaminophen (TYLENOL) 500 MG tablet Take 500 mg by mouth as needed.    [provider]  aspirin EC 81 MG tablet Take 1 tablet (81 mg total) by mouth at bedtime. 09/28/20   Alver Sorrow, NP  carbidopa-levodopa (SINEMET IR) 25-100 MG tablet TAKE 2 TABLETS BY MOUTH AT 8AM; TAKE 2 TABLETS BY MOUTH AT 11AM; TAKE 2 TABLETS BY MOUTH AT 2PM.; TAKE 1 TABLET BY MOUTH AT 5PM. 06/04/23   Tat, Octaviano Batty, DO  chlorpheniramine-HYDROcodone (TUSSIONEX) 10-8 MG/5ML Take 5 mLs by mouth every 12 (twelve) hours as needed for up to 14 days for cough. 07/31/23 08/14/23  Charise Killian, MD  clonazePAM (KLONOPIN) 1 MG tablet Take 1 mg by mouth at bedtime as needed. 07/17/23   [provider]  furosemide (LASIX) 20 MG tablet Take 1 tablet (20 mg total) by mouth daily as needed (for swelling). 03/02/23   Reather Littler D, NP  hydrALAZINE (APRESOLINE) 50 MG tablet Take 1 tablet (50 mg total) by mouth every 6 (six) hours as needed (SBP >170 or DBP >100). 07/13/23 08/12/23  Carollee Herter, DO  hydrocortisone cream 1 % Apply topically 2 (two) times daily. 07/31/23   Fabienne Bruns  M, MD  isosorbide mononitrate (IMDUR) 60 MG 24 hr tablet TAKE 1 TABLET BY MOUTH DAILY 04/06/23   Iran Ouch, MD  melatonin 3 MG TABS tablet Take 1.5 mg by mouth at bedtime as needed.    [provider]  Multiple Vitamin (MULTIVITAMIN) tablet Take 1 tablet by mouth daily.    [provider]  nitroGLYCERIN (NITROSTAT) 0.4 MG SL tablet Place 1 tablet (0.4 mg total) under the tongue every 5 (five) minutes as needed for chest pain. 09/28/20   Alver Sorrow, NP  omeprazole (PRILOSEC) 20 MG capsule Take 1 capsule (20 mg total) by mouth every morning. 05/22/23   [name], MD  ondansetron (ZOFRAN) 4 MG tablet Take 1 tablet (4 mg  total) by mouth every 6 (six) hours as needed for nausea or vomiting. 07/13/23   Carollee Herter, DO  polyethylene glycol [facility] / GLYCOLAX) packet Take 17 g by mouth daily as needed.    [provider]  ranolazine (RANEXA) 1000 MG SR tablet TAKE 1 TABLET BY MOUTH 2 TIMES A DAY Patient not taking: Reported on 07/09/2023 03/05/23   Iran Ouch, MD  rosuvastatin (CRESTOR) 10 MG tablet TAKE 1 TABLET BY MOUTH DAILY 06/04/23   Iran Ouch, MD  tamsulosin (FLOMAX) 0.4 MG CAPS capsule Take 1 capsule (0.4 mg total) by mouth daily. 08/04/23   Harle Battiest, PA-C    Physical Exam   Triage Vital Signs: ED Triage Vitals [08/04/23 1958]  Encounter Vitals Group     BP (!) 204/57     Systolic BP Percentile      Diastolic BP Percentile      Pulse Rate 61     Resp 20     Temp 98.8 F (37.1 C)     Temp Source Oral     SpO2 97 %     Weight 175 lb (79.4 kg)     Height (!) 6" (0.152 m)     Head Circumference      Peak Flow      Pain Score 0     Pain Loc      Pain Education      Exclude from Growth Chart     Most recent vital signs: Vitals:   08/05/23 0130 08/05/23 0230  BP: (!) 189/125 (!) 157/64  Pulse: 62 70  Resp: (!) 21 15  Temp:    SpO2: 98% 98%    CONSTITUTIONAL: Alert, responds appropriately to questions.  Elderly, nontoxic HEAD: Normocephalic, atraumatic EYES: Conjunctivae clear, pupils appear equal, sclera nonicteric ENT: normal nose; moist mucous membranes NECK: Supple, normal ROM CARD: RRR; S1 and S2 appreciated RESP: Normal chest excursion without splinting or tachypnea; breath sounds clear and equal bilaterally; no wheezes, no rhonchi, no rales, no hypoxia or respiratory distress, speaking full sentences ABD/GI: Non-distended; soft, mild tenderness to palpation diffusely, no guarding or rebound BACK: The back appears normal EXT: Normal ROM in all joints; no deformity noted, no edema SKIN: Normal color for age and race; warm; no rash on exposed  skin NEURO: Moves all extremities equally, normal speech, no facial asymmetry PSYCH: The patient's mood and manner are appropriate.   ED Results / Procedures / Treatments   LABS: (all labs ordered are listed, but only abnormal results are displayed) Labs Reviewed  RESP PANEL BY RT-PCR (RSV, FLU A&B, COVID)  RVPGX2 - Abnormal; Notable for the following components:      Result Value   Resp Syncytial Virus by PCR  POSITIVE (*)    All other components within normal limits  BASIC METABOLIC PANEL - Abnormal; Notable for the following components:   Glucose, Bld 100 (*)    Creatinine, Ser 1.27 (*)    GFR, Estimated 58 (*)    All other components within normal limits  CBC - Abnormal; Notable for the following components:   RBC 3.11 (*)    Hemoglobin 10.7 (*)    HCT 30.6 (*)    MCH 34.4 (*)    All other components within normal limits  HEPATIC FUNCTION PANEL - Abnormal; Notable for the following components:   Total Protein 5.8 (*)    Albumin 3.4 (*)    Bilirubin, Direct 0.3 (*)    All other components within normal limits  URINALYSIS, W/ REFLEX TO CULTURE (INFECTION SUSPECTED) - Abnormal; Notable for the following components:   Color, Urine STRAW (*)    APPearance CLEAR (*)    Specific Gravity, Urine 1.003 (*)    All other components within normal limits  CULTURE, BLOOD (SINGLE)  LACTIC ACID, PLASMA  PROCALCITONIN  LIPASE, BLOOD  TROPONIN I (HIGH SENSITIVITY)  TROPONIN I (HIGH SENSITIVITY)     EKG:  EKG Interpretation Date/Time:  Tuesday August 04 2023 19:59:10 EST Ventricular Rate:  60 PR Interval:  176 QRS Duration:  80 QT Interval:  462 QTC Calculation: 462 R Axis:   73  Text Interpretation: Normal sinus rhythm Possible Anterior infarct (cited on or before 22-May-2023) Abnormal ECG When compared with ECG of 26-Jul-2023 08:58, Questionable change in initial forces of Septal leads Confirmed by Rochele Raring [phone]) on 08/04/2023 11:15:37 PM         RADIOLOGY: My  personal review and interpretation of imaging: Chest x-ray shows no acute abnormality.  CT of the chest, abdomen and pelvis show pneumonia and diverticulitis has resolved.  CT head unremarkable.  I have personally reviewed all radiology reports.   CT HEAD WO CONTRAST ( ) Result Date: 08/05/2023 CLINICAL DATA:  Mental status change EXAM: CT HEAD WITHOUT CONTRAST TECHNIQUE: Contiguous axial images were obtained from the base of the skull through the vertex without intravenous contrast. RADIATION DOSE REDUCTION: This exam was performed according to the departmental dose-optimization program which includes automated exposure control, adjustment of the mA and/or kV according to patient size and/or use of iterative reconstruction technique. COMPARISON:  MRI brain 03/30/2023 FINDINGS: Brain: No evidence of acute infarction, hemorrhage, hydrocephalus, extra-axial collection or mass lesion/mass effect. There is mild periventricular white matter hypodensity, likely chronic small vessel ischemic change. Note is made that there has been recent contrast administration. Vascular: Atherosclerotic calcifications are present within the cavernous internal carotid arteries. Skull: Normal. Negative for fracture or focal lesion. Sinuses/Orbits: Mucosal thickening and air-fluid levels are seen in the right frontal and right maxillary sinus. There is mucosal thickening of the left frontal sinus, sphenoid sinuses and ethmoid air cells. Mastoid air cells are clear. Orbits are within normal limits. Other: None. IMPRESSION: 1. No acute intracranial process. 2. Mild chronic small vessel ischemic changes. 3. Acute right frontal and maxillary sinusitis. Electronically Signed   By: Darliss Cheney M.D.   On: 08/05/2023 00:59   CT CHEST ABDOMEN PELVIS W CONTRAST Result Date: 08/05/2023 CLINICAL DATA:  Sepsis EXAM: CT CHEST, ABDOMEN, AND PELVIS WITH CONTRAST TECHNIQUE: Multidetector CT imaging of the chest, abdomen and pelvis was performed  following the standard protocol during bolus administration of intravenous contrast. RADIATION DOSE REDUCTION: This exam was performed according to the departmental dose-optimization program which includes  automated exposure control, adjustment of the mA and/or kV according to patient size and/or use of iterative reconstruction technique. CONTRAST:  OMNIPAQUE IOHEXOL 300 MG/ML  SOLN COMPARISON:  CT chest 07/27/2023. CT abdomen and pelvis 07/26/2023 CT of the chest 05/07/2021. FINDINGS: CT CHEST FINDINGS Cardiovascular: Heart is mildly enlarged. Aorta is normal in size. Patient is status post cardiac surgery. There are atherosclerotic calcifications of the aorta. There is no pericardial effusion. Mediastinum/Nodes: There is a 3 mm hypodense right thyroid nodule which is unchanged. There is an enlarged subcarinal lymph node measuring 1 cm which has decreased in size. No other enlarged lymph nodes are seen. Lungs/Pleura: There are trace bilateral pleural effusions similar to the prior study. There is a stable 5 mm right apical nodule. Previously identified patchy airspace opacities in the right lower lobe have resolved. Stable focal nodular density measuring 7 mm in the left lung base. These are unchanged from 2022 and favored as benign. No pneumothorax. No new focal lung consolidation. Musculoskeletal: Sternotomy wires are present. No acute fractures are seen. CT ABDOMEN PELVIS FINDINGS Hepatobiliary: No focal liver abnormality is seen. No gallstones, gallbladder wall thickening, or biliary dilatation. Pancreas: There is a fat containing lesion in the tail of the pancreas which is rounded measuring 9 mm compatible with lipoma, unchanged. Otherwise, the pancreas appears within normal limits. Spleen: Normal in size without focal abnormality. Adrenals/Urinary Tract: Adrenal glands are unremarkable. Kidneys are normal, without renal calculi, focal lesion, or hydronephrosis. Bladder is unremarkable. Stomach/Bowel:  Stomach is within normal limits. Appendix appears normal. No evidence of bowel wall thickening, distention, or inflammatory changes. There is diffuse colonic diverticulosis. Vascular/Lymphatic: Aortic atherosclerosis. No enlarged abdominal or pelvic lymph nodes. Reproductive: Prostate gland is heterogeneous and enlarged. Other: There is trace free fluid in the pelvis. No focal abdominal wall hernia. Musculoskeletal: Degenerative changes affect the spine and hips. IMPRESSION: 1. No acute localizing process in the chest, abdomen or pelvis. 2. Stable trace bilateral pleural effusions. 3. Stable pancreatic lipoma. 4. Colonic diverticulosis. 5. Trace free fluid in the pelvis. Aortic Atherosclerosis (ICD10-I70.0). Electronically Signed   By: Darliss Cheney M.D.   On: 08/05/2023 00:03   DG Chest 2 View Result Date: 08/04/2023 CLINICAL DATA:  Weakness EXAM: CHEST - 2 VIEW COMPARISON:  X-ray 07/26/2023 and older.  CT 07/27/2023. FINDINGS: Sternal wires. No consolidation, pneumothorax or effusion. Normal cardiopericardial silhouette. Mild interstitial prominence. Degenerative changes of the spine. IMPRESSION: Postop chest.  Chronic changes. Electronically Signed   By: Karen Kays M.D.   On: 08/04/2023 20:28     PROCEDURES:  Critical Care performed: No      .1-3 Lead EKG Interpretation  Performed by: Aleah Ahlgrim, Layla Maw, DO Authorized by: Terrica Duecker, Layla Maw, DO     Interpretation: normal     ECG rate:  70   ECG rate assessment: normal     Rhythm: sinus rhythm     Ectopy: none     Conduction: normal       IMPRESSION / MDM / ASSESSMENT AND PLAN / ED COURSE  I reviewed the triage vital signs and the nursing notes.    Patient here with generalized weakness, failure to thrive after recent admission for RSV, pneumonia and diverticulitis.  The patient is on the cardiac monitor to evaluate for evidence of arrhythmia and/or significant heart rate changes.   DIFFERENTIAL DIAGNOSIS (includes but not  limited to):   Deconditioning, RSV, pneumonia, worsening diverticulitis, anemia, electrolyte derangement, UTI, dehydration   Patient's presentation is most consistent  with acute presentation with potential threat to life or bodily function.   PLAN: Will obtain labs, urine, CT head, CT of the chest, abdomen pelvis.  Will give IV fluids.  Anticipate admission.  Will give hydralazine for blood pressure.   MEDICATIONS GIVEN IN ED: Medications  sodium chloride 0.9 % bolus 1,000 mL (0 mLs Intravenous Stopped 08/05/23 0135)  iohexol (OMNIPAQUE) 300 MG/ML solution 100 mL (100 mLs Intravenous Contrast Given 08/04/23 2345)  hydrALAZINE (APRESOLINE) injection 5 mg (5 mg Intravenous Given 08/05/23 0135)     ED COURSE: Patient's labs show stable anemia.  Creatinine minimally elevated.  He is giving additional IV fluids here.  Patient is still RSV positive.  Urine shows no infection.  Lactic normal.  Troponin x 2 negative.  EKG nonischemic.  CT scans reviewed and interpreted by myself and the radiologist and showed no acute abnormality.  Blood pressure improved with hydralazine.  Patient is still very weak, unable to get up on his own.  Will discuss with hospitalist for admission.  I suspect some of this is deconditioning due to recent and potentially ongoing RSV infection.  Both son and I suspect that patient will need discharge from the hospital to a rehab facility.   CONSULTS:  Consulted and discussed patient's case with hospitalist, Dr. Arville Care.  I have recommended admission and consulting physician agrees and will place admission orders.  Patient (and family if present) agree with this plan.   I reviewed all nursing notes, vitals, pertinent previous records.  All labs, EKGs, imaging ordered have been independently reviewed and interpreted by myself.    OUTSIDE RECORDS REVIEWED: Reviewed recent admission.       FINAL CLINICAL IMPRESSION(S) / ED DIAGNOSES   Final diagnoses:  Generalized  weakness  Uncontrolled hypertension  RSV infection     Rx / DC Orders   ED Discharge Orders     None        Note:  This document was prepared using Dragon voice recognition software and may include unintentional dictation errors.   Hannelore Bova, Layla Maw, DO 08/05/23 [phone]

## 2023-08-04 NOTE — Transitions of Care (Post Inpatient/ED Visit) (Signed)
   08/04/2023  Name: Johnathan Arnold. MRN: [id number] DOB: Feb 13, 1946  Today's TOC FU Call Status: Today's TOC FU Call Status:: Unsuccessful Call (2nd Attempt) Unsuccessful Call (2nd Attempt) Date: 08/04/23  Attempted to reach the patient regarding the most recent Inpatient visit; left HIPAA compliant voice message requesting call back  Follow Up Plan: Additional outreach attempts will be made to reach the patient to complete the Transitions of Care (Post Inpatient visit) call.   Pls call/ message for questions,  Caryl Pina, RN, BSN, CCRN Alumnus RN Care Manager  Transitions of Care  [facility] [phone]: direct office

## 2023-08-04 NOTE — ED Triage Notes (Signed)
 Pt brought in via ems from home.  Pt recently discharged from [facility] last week with pneumonia.  Pt has increased weakness.  Iv in place.  Pt on stretcher in triage.  Pt resting with eyes closed.  Pt denies any pain.  Pt reports a cough

## 2023-08-05 ENCOUNTER — Emergency Department: Payer: Medicare Other

## 2023-08-05 DIAGNOSIS — R531 Weakness: Secondary | ICD-10-CM

## 2023-08-05 DIAGNOSIS — G20A1 Parkinson's disease without dyskinesia, without mention of fluctuations: Secondary | ICD-10-CM | POA: Diagnosis not present

## 2023-08-05 DIAGNOSIS — G9340 Encephalopathy, unspecified: Secondary | ICD-10-CM | POA: Diagnosis not present

## 2023-08-05 DIAGNOSIS — I10 Essential (primary) hypertension: Secondary | ICD-10-CM

## 2023-08-05 DIAGNOSIS — Z951 Presence of aortocoronary bypass graft: Secondary | ICD-10-CM | POA: Diagnosis not present

## 2023-08-05 DIAGNOSIS — I2510 Atherosclerotic heart disease of native coronary artery without angina pectoris: Secondary | ICD-10-CM | POA: Diagnosis not present

## 2023-08-05 DIAGNOSIS — M6281 Muscle weakness (generalized): Secondary | ICD-10-CM | POA: Diagnosis not present

## 2023-08-05 LAB — URINALYSIS, W/ REFLEX TO CULTURE (INFECTION SUSPECTED)
Bacteria, UA: NONE SEEN
Bilirubin Urine: NEGATIVE
Glucose, UA: NEGATIVE mg/dL
Hgb urine dipstick: NEGATIVE
Ketones, ur: NEGATIVE mg/dL
Leukocytes,Ua: NEGATIVE
Nitrite: NEGATIVE
Protein, ur: NEGATIVE mg/dL
Specific Gravity, Urine: 1.003 — ABNORMAL LOW (ref 1.005–1.030)
Squamous Epithelial / HPF: 0 /[HPF] (ref 0–5)
pH: 6 (ref 5.0–8.0)

## 2023-08-05 LAB — HEPATIC FUNCTION PANEL
ALT: 14 U/L (ref 0–44)
AST: 21 U/L (ref 15–41)
Albumin: 3.4 g/dL — ABNORMAL LOW (ref 3.5–5.0)
Alkaline Phosphatase: 38 U/L (ref 38–126)
Bilirubin, Direct: 0.3 mg/dL — ABNORMAL HIGH (ref 0.0–0.2)
Indirect Bilirubin: 0.6 mg/dL (ref 0.3–0.9)
Total Bilirubin: 0.9 mg/dL (ref 0.0–1.2)
Total Protein: 5.8 g/dL — ABNORMAL LOW (ref 6.5–8.1)

## 2023-08-05 LAB — PROCALCITONIN: Procalcitonin: <0.1 ng/mL

## 2023-08-05 LAB — TROPONIN I (HIGH SENSITIVITY): Troponin I (High Sensitivity): 11 ng/L (ref <18)

## 2023-08-05 LAB — LIPASE, BLOOD: Lipase: 31 U/L (ref 11–51)

## 2023-08-05 LAB — LACTIC ACID, PLASMA: Lactic Acid, Venous: 0.9 mmol/L (ref 0.5–1.9)

## 2023-08-05 MED ORDER — CARBIDOPA-LEVODOPA 25-100 MG PO TABS: 2.0000 | ORAL_TABLET | Freq: Three times a day (TID) | ORAL | Status: DC

## 2023-08-05 MED ORDER — ONDANSETRON HCL 4 MG PO TABS: 4.0000 mg | ORAL_TABLET | Freq: Four times a day (QID) | ORAL | Status: DC | PRN

## 2023-08-05 MED ORDER — ONDANSETRON HCL 4 MG/2ML IJ SOLN: 4.0000 mg | Freq: Four times a day (QID) | INTRAMUSCULAR | Status: DC | PRN

## 2023-08-05 MED ORDER — CARBIDOPA-LEVODOPA 25-100 MG PO TABS
2.0000 | ORAL_TABLET | Freq: Three times a day (TID) | ORAL | Status: DC
  Administered 2023-08-06 – 2023-08-07 (×5): 2 via ORAL
  Filled 2023-08-05 (×5): qty 2

## 2023-08-05 MED ORDER — CARBIDOPA-LEVODOPA 25-100 MG PO TABS
1.0000 | ORAL_TABLET | Freq: Every day | ORAL | Status: DC
  Administered 2023-08-05 – 2023-08-06 (×2): 1 via ORAL
  Filled 2023-08-05 (×2): qty 1

## 2023-08-05 MED ORDER — ENOXAPARIN SODIUM 40 MG/0.4ML IJ SOSY
40.0000 mg | PREFILLED_SYRINGE | INTRAMUSCULAR | Status: DC
  Administered 2023-08-05 – 2023-08-07 (×3): 40 mg via SUBCUTANEOUS
  Filled 2023-08-05 (×3): qty 0.4

## 2023-08-05 MED ORDER — HYDRALAZINE HCL 20 MG/ML IJ SOLN
10.0000 mg | INTRAMUSCULAR | Status: DC | PRN
  Administered 2023-08-05: 10 mg via INTRAVENOUS
  Filled 2023-08-05: qty 1

## 2023-08-05 MED ORDER — HYDRALAZINE HCL 20 MG/ML IJ SOLN
5.0000 mg | Freq: Once | INTRAMUSCULAR | Status: AC
  Administered 2023-08-05: 5 mg via INTRAVENOUS
  Filled 2023-08-05: qty 1

## 2023-08-05 MED ORDER — SODIUM CHLORIDE 0.9 % IV SOLN: INTRAVENOUS | Status: AC

## 2023-08-05 MED ORDER — TAMSULOSIN HCL 0.4 MG PO CAPS
0.4000 mg | ORAL_CAPSULE | Freq: Every day | ORAL | Status: DC
  Administered 2023-08-05 – 2023-08-07 (×3): 0.4 mg via ORAL
  Filled 2023-08-05 (×3): qty 1

## 2023-08-05 NOTE — Assessment & Plan Note (Signed)
 Baseline Parkinson's without overt tremor at present Continue Sinemet Monitor

## 2023-08-05 NOTE — Assessment & Plan Note (Addendum)
 RSV positive for multiple weeks-likely confounded to decompensated weakness S/p course of antibiotics for lobar pna CT Imaging negative for any focal infiltrate  No hypoxia present Monitor for now Reassess as appropriate

## 2023-08-05 NOTE — Progress Notes (Signed)
 PT Cancellation Note  Patient Details Name: Johnathan Arnold. MRN: [id number] DOB: 11/24/1945   Cancelled Treatment:    Reason Eval/Treat Not Completed: Patient not medically ready. Patient has been hypertensive with BP 190/67. Will hold for now and see patient when medically ready.     Tru Rana 08/05/2023, 2:48 PM

## 2023-08-05 NOTE — Assessment & Plan Note (Addendum)
 Encephalopathy  Positive generalized lethargy, weakness on presentation Suspect likely multifactorial with recent admission for RSV associated pneumonia, diverticulitis and weakness  Noted baseline Parkinson's as well as chronic weakness are confounding issues No acute infectious process noted on imaging or lab analysis thus far Nonfocal neuro exam  Does appear clinically dry Gentle IV fluid hydration Hold sedating medications Fall precautions May benefit from inpatient rehab Monitor

## 2023-08-05 NOTE — ED Notes (Signed)
 Pt cleaned of incontinence. Peri-care provided, new brief provided, bed linens changed.

## 2023-08-05 NOTE — Assessment & Plan Note (Signed)
 No active chest pain at present Monitor

## 2023-08-05 NOTE — H&P (Addendum)
 History and Physical    Patient: Johnathan Arnold. XLK:[id number] DOB: 1946-04-26 DOA: 08/04/2023 DOS: the patient was seen and examined on 08/05/2023 PCP: [name], MD  Patient coming from: Home  Chief Complaint:  Chief Complaint  Patient presents with   Weakness   HPI: Johnathan Zuver. is a 78 y.o. male with medical history significant of CAD status post CABG, type 2 diabetes, Parkinson disease, hyperlipidemia, hypertension presenting with weakness.  Patient noted to have been admitted February 16 November 21 for issues including right lower lobe pneumonia, RSV, diverticulitis.  Per the son at the bedside, patient has had progressively worsening weakness and fatigue at home.  No reports of nausea or vomiting, cough or shortness of breath.  No reported falls.  Patient currently lives at home with wife.  Has had some home health visits though full logistics of the visits have been unclear per the son.  Has grown more lethargic over the past 1 to 2 days with patient not getting out of bed.  Unclear if patient has been compliant with home medications including Sinemet, Klonopin, azithromycin. Presented to the ER afebrile, hemodynamically stable.  Satting well on room air.  White count 4.4, hemoglobin 10.7, platelets 296, urinalysis not indicative of infection.  Troponin within normal limits x 2.  COVID flu negative.  Still RSV positive. Review of Systems: As mentioned in the history of present illness. All other systems reviewed and are negative. Past Medical History:  Diagnosis Date   3-vessel coronary artery disease    s/p  5 vessel CABG   Diabetes mellitus without complication (HCC)    History of cardiac catheterization 2011   [facility]   Hyperlipidemia    Hypertension    Hypertriglyceridemia    Parkinson's disease (HCC)    Pneumonia 12/28/2020   S/P CABG x 5 11-99   Vertigo    Past Surgical History:  Procedure Laterality Date   CARDIAC CATHETERIZATION  05-19-2010   [facility]:  Patent grafts. LIMA to LAD, SVG to D1, OM1 and RPDA   CORONARY ARTERY BYPASS GRAFT  03/1998   5 vessel, [facility]   RIGHT HEART CATH N/A 04/04/2019   Procedure: RIGHT HEART CATH;  Surgeon: Iran Ouch, MD;  Location: [facility] INVASIVE CV LAB;  Service: Cardiovascular;  Laterality: N/A;   RIGHT/LEFT HEART CATH AND CORONARY ANGIOGRAPHY N/A 02/01/2018   Procedure: RIGHT/LEFT HEART CATH AND CORONARY ANGIOGRAPHY;  Surgeon: Iran Ouch, MD;  Location: [facility] INVASIVE CV LAB;  Service: Cardiovascular;  Laterality: N/A;   Social History:  reports that he has never smoked. He has been exposed to tobacco smoke. He has never used smokeless tobacco. He reports current alcohol use. He reports that he does not use drugs.  Allergies  Allergen Reactions   Paxlovid [Nirmatrelvir-Ritonavir]     Family History  Problem Relation Age of Onset   Heart attack Mother 51   Hypertension Mother    Heart attack Father 39   Heart disease Father    Heart disease Brother    Healthy Son     Prior to Admission medications   Medication Sig Start Date End Date Taking? Authorizing Provider  acetaminophen (TYLENOL) 500 MG tablet Take 500 mg by mouth as needed.   Yes [provider]  aspirin EC 81 MG tablet Take 1 tablet (81 mg total) by mouth at bedtime. 09/28/20  Yes Alver Sorrow, NP  carbidopa-levodopa (SINEMET IR) 25-100 MG tablet TAKE 2 TABLETS BY MOUTH AT 8AM;  TAKE 2 TABLETS BY MOUTH AT 11AM; TAKE 2 TABLETS BY MOUTH AT 2PM.; TAKE 1 TABLET BY MOUTH AT 5PM. 06/04/23  Yes Tat, Octaviano Batty, DO  chlorpheniramine-HYDROcodone (TUSSIONEX) 10-8 MG/5ML Take 5 mLs by mouth every 12 (twelve) hours as needed for up to 14 days for cough. 07/31/23 08/14/23 Yes Charise Killian, MD  clonazePAM (KLONOPIN) 1 MG tablet Take 1 mg by mouth at bedtime as needed. 07/17/23  Yes [provider]  furosemide (LASIX) 20 MG tablet Take 1 tablet (20 mg total) by mouth daily as needed (for swelling). 03/02/23  Yes West,  Katlyn D, NP  hydrALAZINE (APRESOLINE) 50 MG tablet Take 1 tablet (50 mg total) by mouth every 6 (six) hours as needed (SBP >170 or DBP >100). 07/13/23 08/12/23 Yes Carollee Herter, DO  hydrocortisone cream 1 % Apply topically 2 (two) times daily. 07/31/23  Yes Charise Killian, MD  isosorbide mononitrate (IMDUR) 60 MG 24 hr tablet TAKE 1 TABLET BY MOUTH DAILY 04/06/23  Yes Iran Ouch, MD  melatonin 3 MG TABS tablet Take 1.5 mg by mouth at bedtime as needed.   Yes [provider]  Multiple Vitamin (MULTIVITAMIN) tablet Take 1 tablet by mouth daily.   Yes [provider]  nitroGLYCERIN (NITROSTAT) 0.4 MG SL tablet Place 1 tablet (0.4 mg total) under the tongue every 5 (five) minutes as needed for chest pain. 09/28/20  Yes Alver Sorrow, NP  omeprazole (PRILOSEC) 20 MG capsule Take 1 capsule (20 mg total) by mouth every morning. 05/22/23  Yes [name], MD  ondansetron (ZOFRAN) 4 MG tablet Take 1 tablet (4 mg total) by mouth every 6 (six) hours as needed for nausea or vomiting. 07/13/23  Yes Carollee Herter, DO  polyethylene glycol [facility] / GLYCOLAX) packet Take 17 g by mouth daily as needed.   Yes [provider]  rosuvastatin (CRESTOR) 10 MG tablet TAKE 1 TABLET BY MOUTH DAILY 06/04/23  Yes Iran Ouch, MD  tamsulosin (FLOMAX) 0.4 MG CAPS capsule Take 1 capsule (0.4 mg total) by mouth daily. 08/04/23  Yes McGowan, Carollee Herter A, PA-C  ranolazine (RANEXA) 1000 MG SR tablet TAKE 1 TABLET BY MOUTH 2 TIMES A DAY Patient not taking: Reported on 07/09/2023 03/05/23   Iran Ouch, MD    Physical Exam: Vitals:   08/05/23 0400 08/05/23 0630 08/05/23 0700 08/05/23 0900  BP: (!) 161/66 (!) 159/62 (!) 160/56   Pulse: 65  61   Resp: 13 16 19    Temp: 98.9 F (37.2 C)     TempSrc: Oral     SpO2: 96%  95%   Weight:      Height:    5\' 6"  (1.676 m)   Physical Exam Constitutional:      Appearance: He is normal weight.     Comments: Sleeping    HENT:     Head:  Normocephalic and atraumatic.     Mouth/Throat:     Mouth: Mucous membranes are dry.  Eyes:     Pupils: Pupils are equal, round, and reactive to light.  Cardiovascular:     Rate and Rhythm: Normal rate and regular rhythm.  Pulmonary:     Effort: Pulmonary effort is normal.  Abdominal:     General: Bowel sounds are normal.  Musculoskeletal:     Comments: + generalized weakness    Skin:    General: Skin is dry.  Neurological:     General: No focal deficit present.  Comments: + generalized lethargy    Psychiatric:        Mood and Affect: Mood normal.     Data Reviewed:  There are no new results to review at this time.  CT HEAD WO CONTRAST ( ) CLINICAL DATA:  Mental status change  EXAM: CT HEAD WITHOUT CONTRAST  TECHNIQUE: Contiguous axial images were obtained from the base of the skull through the vertex without intravenous contrast.  RADIATION DOSE REDUCTION: This exam was performed according to the departmental dose-optimization program which includes automated exposure control, adjustment of the mA and/or kV according to patient size and/or use of iterative reconstruction technique.  COMPARISON:  MRI brain 03/30/2023  FINDINGS: Brain: No evidence of acute infarction, hemorrhage, hydrocephalus, extra-axial collection or mass lesion/mass effect. There is mild periventricular white matter hypodensity, likely chronic small vessel ischemic change. Note is made that there has been recent contrast administration.  Vascular: Atherosclerotic calcifications are present within the cavernous internal carotid arteries.  Skull: Normal. Negative for fracture or focal lesion.  Sinuses/Orbits: Mucosal thickening and air-fluid levels are seen in the right frontal and right maxillary sinus. There is mucosal thickening of the left frontal sinus, sphenoid sinuses and ethmoid air cells. Mastoid air cells are clear. Orbits are within normal limits.  Other:  None.  IMPRESSION: 1. No acute intracranial process. 2. Mild chronic small vessel ischemic changes. 3. Acute right frontal and maxillary sinusitis.  Electronically Signed   By: Darliss Cheney M.D.   On: 08/05/2023 00:59 CT CHEST ABDOMEN PELVIS W CONTRAST CLINICAL DATA:  Sepsis  EXAM: CT CHEST, ABDOMEN, AND PELVIS WITH CONTRAST  TECHNIQUE: Multidetector CT imaging of the chest, abdomen and pelvis was performed following the standard protocol during bolus administration of intravenous contrast.  RADIATION DOSE REDUCTION: This exam was performed according to the departmental dose-optimization program which includes automated exposure control, adjustment of the mA and/or kV according to patient size and/or use of iterative reconstruction technique.  CONTRAST:  OMNIPAQUE IOHEXOL 300 MG/ML  SOLN  COMPARISON:  CT chest 07/27/2023. CT abdomen and pelvis 07/26/2023 CT of the chest 05/07/2021.  FINDINGS: CT CHEST FINDINGS  Cardiovascular: Heart is mildly enlarged. Aorta is normal in size. Patient is status post cardiac surgery. There are atherosclerotic calcifications of the aorta. There is no pericardial effusion.  Mediastinum/Nodes: There is a 3 mm hypodense right thyroid nodule which is unchanged. There is an enlarged subcarinal lymph node measuring 1 cm which has decreased in size. No other enlarged lymph nodes are seen.  Lungs/Pleura: There are trace bilateral pleural effusions similar to the prior study. There is a stable 5 mm right apical nodule. Previously identified patchy airspace opacities in the right lower lobe have resolved. Stable focal nodular density measuring 7 mm in the left lung base. These are unchanged from 2022 and favored as benign. No pneumothorax. No new focal lung consolidation.  Musculoskeletal: Sternotomy wires are present. No acute fractures are seen.  CT ABDOMEN PELVIS FINDINGS  Hepatobiliary: No focal liver abnormality is seen. No  gallstones, gallbladder wall thickening, or biliary dilatation.  Pancreas: There is a fat containing lesion in the tail of the pancreas which is rounded measuring 9 mm compatible with lipoma, unchanged. Otherwise, the pancreas appears within normal limits.  Spleen: Normal in size without focal abnormality.  Adrenals/Urinary Tract: Adrenal glands are unremarkable. Kidneys are normal, without renal calculi, focal lesion, or hydronephrosis. Bladder is unremarkable.  Stomach/Bowel: Stomach is within normal limits. Appendix appears normal. No evidence of bowel wall thickening,  distention, or inflammatory changes. There is diffuse colonic diverticulosis.  Vascular/Lymphatic: Aortic atherosclerosis. No enlarged abdominal or pelvic lymph nodes.  Reproductive: Prostate gland is heterogeneous and enlarged.  Other: There is trace free fluid in the pelvis. No focal abdominal wall hernia.  Musculoskeletal: Degenerative changes affect the spine and hips.  IMPRESSION: 1. No acute localizing process in the chest, abdomen or pelvis. 2. Stable trace bilateral pleural effusions. 3. Stable pancreatic lipoma. 4. Colonic diverticulosis. 5. Trace free fluid in the pelvis.  Aortic Atherosclerosis (ICD10-I70.0).  Electronically Signed   By: Darliss Cheney M.D.   On: 08/05/2023 00:03  Lab Results  Component Value Date   WBC 4.4 08/04/2023   HGB 10.7 (L) 08/04/2023   HCT 30.6 (L) 08/04/2023   MCV 98.4 08/04/2023   PLT 296 08/04/2023   Last metabolic panel Lab Results  Component Value Date   GLUCOSE 100 (H) 08/04/2023   NA 137 08/04/2023   K 4.4 08/04/2023   CL 104 08/04/2023   CO2 25 08/04/2023   BUN 11 08/04/2023   CREATININE 1.27 (H) 08/04/2023   GFRNONAA 58 (L) 08/04/2023   CALCIUM 8.9 08/04/2023   PHOS 4.3 11/23/2020   PROT 5.8 (L) 08/04/2023   ALBUMIN 3.4 (L) 08/04/2023   LABGLOB 2.0 (L) 02/07/2021   AGRATIO 2.0 (H) 02/07/2021   BILITOT 0.9 08/04/2023   ALKPHOS 38  08/04/2023   AST 21 08/04/2023   ALT 14 08/04/2023   ANIONGAP 8 08/04/2023    Assessment and Plan: * Generalized weakness Encephalopathy  Positive generalized lethargy, weakness on presentation Suspect likely multifactorial with recent admission for RSV associated pneumonia, diverticulitis and weakness  Noted baseline Parkinson's as well as chronic weakness are confounding issues No acute infectious process noted on imaging or lab analysis thus far Nonfocal neuro exam  Does appear clinically dry Gentle IV fluid hydration Hold sedating medications Fall precautions May benefit from inpatient rehab Monitor  Parkinson's disease [facility]) Baseline Parkinson's without overt tremor at present Continue Sinemet Monitor  CKD stage 3a, GFR 45-59 ml/min (HCC) Creatinine 1.2 with GFR in the 50s At baseline Monitor  Essential hypertension BP stable  Titrate home regimen    RSV (respiratory syncytial virus pneumonia) RSV positive for multiple weeks-likely confounded to decompensated weakness S/p course of antibiotics for lobar pna CT Imaging negative for any focal infiltrate  No hypoxia present Monitor for now Reassess as appropriate  S/P CABG x 5 No active chest pain at present Monitor    Greater than 50% was spent in counseling and coordination of care with patient Total encounter time 80 minutes or more   Advance Care Planning:   Code Status: Full Code   Consults: None   Family Communication: Son Johnathan Arnold) at the bedside   Severity of Illness: The appropriate patient status for this patient is INPATIENT. Inpatient status is judged to be reasonable and necessary in order to provide the required intensity of service to ensure the patient's safety. The patient's presenting symptoms, physical exam findings, and initial radiographic and laboratory data in the context of their chronic comorbidities is felt to place them at high risk for further clinical deterioration.  Furthermore, it is not anticipated that the patient will be medically stable for discharge from the hospital within 2 midnights of admission.   * I certify that at the point of admission it is my clinical judgment that the patient will require inpatient hospital care spanning beyond 2 midnights from the point of admission due to high intensity  of service, high risk for further deterioration and high frequency of surveillance required.*  Author: Floydene Flock, MD 08/05/2023 9:10 AM  For on call review [email or web address].

## 2023-08-05 NOTE — Assessment & Plan Note (Signed)
 Creatinine 1.2 with GFR in the 50s At baseline Monitor

## 2023-08-05 NOTE — Progress Notes (Signed)
 Patients son in concerned that patient could be taking his medication incorrectly at home and this could be causing his lethargy.  Patient stated that he take klonopin every night to help with his restless leg.  Son was going to review medication in parents medicine cabinet and discuss with Dr. Bea Laura

## 2023-08-05 NOTE — ED Notes (Addendum)
 This RN notified Doree Albee, MD of elevated BP.   08/05/23 1230 08/05/23 1300 08/05/23 1330  Vitals  BP (!) 187/58 (!) 185/72 (!) 187/68    08/05/23 1400  Vitals  BP (!) 198/69

## 2023-08-05 NOTE — ED Notes (Signed)
 Transport here at this time to take patient to inpatient room.

## 2023-08-05 NOTE — Progress Notes (Signed)
 OT Cancellation Note  Patient Details Name: Johnathan Arnold. MRN: [id number] DOB: [birth year]   Cancelled Treatment:    Reason Eval/Treat Not Completed: Patient not medically ready. .OT order received and chart reviewed. Pt noted to have most recent BP outside of safe parameters for therapy evlauation at this time (190/67). Will follow remotely, evaluate when medically appropriate.    Kathie Dike, M.S. OTR/L  08/05/23, 3:01 PM  ascom [phone]

## 2023-08-05 NOTE — Assessment & Plan Note (Signed)
 BP stable Titrate home regimen

## 2023-08-06 ENCOUNTER — Encounter: Payer: Self-pay | Admitting: Neurology

## 2023-08-06 ENCOUNTER — Ambulatory Visit: Payer: Medicare Other | Admitting: Physical Therapy

## 2023-08-06 DIAGNOSIS — M6281 Muscle weakness (generalized): Secondary | ICD-10-CM | POA: Diagnosis not present

## 2023-08-06 DIAGNOSIS — R531 Weakness: Secondary | ICD-10-CM | POA: Diagnosis not present

## 2023-08-06 LAB — COMPREHENSIVE METABOLIC PANEL
ALT: 17 U/L (ref 0–44)
AST: 19 U/L (ref 15–41)
Albumin: 3.1 g/dL — ABNORMAL LOW (ref 3.5–5.0)
Alkaline Phosphatase: 33 U/L — ABNORMAL LOW (ref 38–126)
Anion gap: 9 (ref 5–15)
BUN: 12 mg/dL (ref 8–23)
CO2: 23 mmol/L (ref 22–32)
Calcium: 8.4 mg/dL — ABNORMAL LOW (ref 8.9–10.3)
Chloride: 106 mmol/L (ref 98–111)
Creatinine, Ser: 1.32 mg/dL — ABNORMAL HIGH (ref 0.61–1.24)
GFR, Estimated: 55 mL/min — ABNORMAL LOW (ref >60)
Glucose, Bld: 96 mg/dL (ref 70–99)
Potassium: 3.3 mmol/L — ABNORMAL LOW (ref 3.5–5.1)
Sodium: 138 mmol/L (ref 135–145)
Total Bilirubin: 0.6 mg/dL (ref 0.0–1.2)
Total Protein: 5.3 g/dL — ABNORMAL LOW (ref 6.5–8.1)

## 2023-08-06 LAB — CBC
HCT: 28.5 % — ABNORMAL LOW (ref 39.0–52.0)
Hemoglobin: 10.1 g/dL — ABNORMAL LOW (ref 13.0–17.0)
MCH: 34.4 pg — ABNORMAL HIGH (ref 26.0–34.0)
MCHC: 35.4 g/dL (ref 30.0–36.0)
MCV: 96.9 fL (ref 80.0–100.0)
Platelets: 266 10*3/uL (ref 150–400)
RBC: 2.94 MIL/uL — ABNORMAL LOW (ref 4.22–5.81)
RDW: 12.7 % (ref 11.5–15.5)
WBC: 4.4 10*3/uL (ref 4.0–10.5)
nRBC: 0 % (ref 0.0–0.2)

## 2023-08-06 MED ORDER — ISOSORBIDE MONONITRATE ER 30 MG PO TB24
60.0000 mg | ORAL_TABLET | Freq: Every day | ORAL | Status: DC
  Administered 2023-08-06 – 2023-08-07 (×2): 60 mg via ORAL
  Filled 2023-08-06 (×2): qty 2

## 2023-08-06 MED ORDER — PANTOPRAZOLE SODIUM 40 MG PO TBEC
40.0000 mg | DELAYED_RELEASE_TABLET | Freq: Every day | ORAL | Status: DC
  Administered 2023-08-07: 40 mg via ORAL
  Filled 2023-08-06: qty 1

## 2023-08-06 MED ORDER — ASPIRIN 81 MG PO TBEC
81.0000 mg | DELAYED_RELEASE_TABLET | Freq: Every day | ORAL | Status: DC
  Administered 2023-08-06: 81 mg via ORAL
  Filled 2023-08-06: qty 1

## 2023-08-06 MED ORDER — OLOPATADINE HCL 0.1 % OP SOLN
1.0000 [drp] | Freq: Two times a day (BID) | OPHTHALMIC | Status: DC
  Administered 2023-08-06 – 2023-08-07 (×3): 1 [drp] via OPHTHALMIC
  Filled 2023-08-06 (×2): qty 5

## 2023-08-06 MED ORDER — POTASSIUM CHLORIDE CRYS ER 20 MEQ PO TBCR
40.0000 meq | EXTENDED_RELEASE_TABLET | Freq: Once | ORAL | Status: AC
  Administered 2023-08-06: 40 meq via ORAL
  Filled 2023-08-06: qty 2

## 2023-08-06 MED ORDER — ROPINIROLE HCL 1 MG PO TABS: 1.0000 mg | ORAL_TABLET | Freq: Every day | ORAL | Status: DC | PRN

## 2023-08-06 MED ORDER — HYDRALAZINE HCL 50 MG PO TABS
100.0000 mg | ORAL_TABLET | Freq: Three times a day (TID) | ORAL | Status: DC
  Administered 2023-08-06 – 2023-08-07 (×4): 100 mg via ORAL
  Filled 2023-08-06 (×4): qty 2

## 2023-08-06 MED ORDER — MELATONIN 5 MG PO TABS: 5.0000 mg | ORAL_TABLET | Freq: Every evening | ORAL | Status: DC | PRN

## 2023-08-06 NOTE — Plan of Care (Signed)

## 2023-08-06 NOTE — Progress Notes (Signed)
  PROGRESS NOTE    Johnathan Arnold.  ZOX:[id number] DOB: 12-03-1945 DOA: 08/04/2023 PCP: [name], MD  219A/219A-AA  LOS: 0 days   Brief hospital course:   Assessment & Plan: Johnathan Arnold. is a 78 y.o. male with medical history significant of CAD status post CABG, type 2 diabetes, Parkinson disease, hyperlipidemia, hypertension presenting with weakness.    Patient noted to have been admitted February 16 November 21 for issues including right lower lobe pneumonia, RSV, diverticulitis.  Per the son at the bedside, patient has had progressively worsening weakness and fatigue at home.  Has grown more lethargic over the past 1 to 2 days with patient not getting out of bed.    Hypoactive delirium --pt more lethargic and confused since discharge from last hospitalization.  Of note, pt has been taking Klonopin for restless leg for a long time, which may exacerbate delirium. No acute infectious process noted on imaging or lab analysis thus far Nonfocal neuro exam  --home Klonopin held.  Pt was alert and oriented this morning. --rec stopping Klonopin at discharge.  * Generalized weakness --had multiple recent hospitalizations and ill with right lower lobe pneumonia, RSV, diverticulitis.  --PT eval rec home with HH, however, family would like to get pt to SNF rehab with self pay.  Parkinson's disease (HCC) Baseline Parkinson's without overt tremor at present Continue Sinemet  Restless legs --d/c Klonopin --start Requip PRN  CKD stage 3a, GFR 45-59 ml/min (HCC) Creatinine 1.2 with GFR in the 50s At baseline  Essential hypertension --BP elevated --resume home Imdur --start hydralazine 100 mg q8h  RSV (respiratory syncytial virus pneumonia) --Dx on 07/26/23 CT Imaging negative for any focal infiltrate  No hypoxia present  S/P CABG x 5 No active chest pain at present --cont home ASA --resume home statin after discharge   DVT prophylaxis: Lovenox SQ Code Status:  Full code  Family Communication: wife updated at bedside, son updated on the phone today Level of care: Telemetry Medical Dispo:   The patient is from: home Anticipated d/c is to: SNF rehab Anticipated d/c date is: tomorrow   Subjective and Interval History:  Pt was alert, oriented during rounds, no complaints, did not remember how he came to the hospital.  Worked with PT this morning.   Objective: Vitals:   08/06/23 0502 08/06/23 0749 08/06/23 1100 08/06/23 1424  BP: (!) 185/60 (!) 186/64 (!) 147/61 (!) 140/51  Pulse: 65 61  64  Resp: 16 18  16   Temp: 98.4 F (36.9 C) 98 F (36.7 C)  98.1 F (36.7 C)  TempSrc: Oral Oral    SpO2: 97% 97%  98%  Weight:      Height:        Intake/Output Summary (Last 24 hours) at 08/06/2023 1854 Last data filed at 08/06/2023 1239 Gross per 24 hour  Intake 1426.85 ml  Output 650 ml  Net 776.85 ml   Filed Weights   08/04/23 1958  Weight: 79.4 kg    Examination:   Constitutional: NAD, AAOx3 HEENT: conjunctivae and lids normal, EOMI CV: No cyanosis.   RESP: normal respiratory effort, on RA Neuro: II - XII grossly intact.   Psych: Normal mood and affect.  Appropriate judgement and reason   Data Reviewed: I have personally reviewed labs and imaging studies  Time spent: 50 minutes  Darlin Priestly, MD [facility] If 7PM-7AM, please contact night-coverage 08/06/2023, 6:54 PM

## 2023-08-06 NOTE — TOC Initial Note (Signed)
 Transition of Care (TOC) - Initial/Assessment Note    Patient Details  Name: Johnathan Arnold. MRN: [id number] Date of Birth: 1946-01-05  Transition of Care [facility]) CM/SW Contact:    Chapman Fitch, RN Phone Number: 08/06/2023, 4:04 PM  Clinical Narrative:                 Received call from Selena Batten at [facility]. She states that patient has already paid a deposit, and can admit to their rehab side but will be private pay Met with patient, wife, and son brad at bedside. Patient will not have a 3 night qualifying stay, and therapy recommends home health.  Patient and family would like to move forward with private pay at [facility]. Kim at [place] confirms he can admit tomorrow.  Fl2 sent for signature. Son is aware that family will have to provide transport         Patient Goals and CMS Choice            Expected Discharge Plan and Services                                              Prior Living Arrangements/Services                       Activities of Daily Living      Permission Sought/Granted                  Emotional Assessment              Admission diagnosis:  Uncontrolled hypertension [I10] Generalized weakness [R53.1] RSV infection [B33.8] Patient Active Problem List   Diagnosis Date Noted   Diverticulitis 07/26/2023   RSV (respiratory syncytial virus pneumonia) 07/26/2023   UTI (urinary tract infection) 07/02/2023   Impaired ambulation 07/02/2023   Orthostatic hypotension 05/22/2023   Generalized weakness 05/07/2021   Bradycardia    Anemia, unspecified 02/07/2021   Neutropenia (HCC) 01/29/2021   Thrombocytopenia (HCC) 01/29/2021   Right lower lobe pneumonia 12/28/2020   Hyponatremia 12/22/2020   Bilateral leg weakness 12/20/2020   Prostate cancer screening 09/08/2020   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) 09/06/2020   Low back pain 08/19/2019   Pulmonary hypertension  (HCC)    Insomnia 12/21/2018   Sleep apnea in adult 12/09/2018   Periodic limb movement disorder 12/09/2018   Pulmonary nodules 05/18/2018   Wears hearing aid in both ears 05/17/2018   Leg pain, bilateral 02/20/2018   Dyspnea    CKD stage 3a, GFR 45-59 ml/min (HCC) 09/21/2017   History of skin cancer in adulthood 08/14/2016   Parkinson's disease (HCC) 08/09/2016   Bilateral carotid artery stenosis 04/02/2015   Vertigo, peripheral 10/17/2014   Benign prostatic hyperplasia with urinary frequency 01/31/2014   Obesity 04/03/2013   Other malaise and fatigue 09/21/2012   Hyperlipidemia    Essential hypertension    3-vessel coronary artery disease    S/P CABG x 5    PCP:  [name], MD Pharmacy:   [facility] [id number] [place], [place] - [address] [place] [address] Phone: [phone] Fax: [phone]     Social Drivers of Health (SDOH) Social History: SDOH Screenings   Food Insecurity: No Food Insecurity (08/05/2023)  Housing: Low Risk  (08/05/2023)  Transportation Needs: No Transportation Needs (08/06/2023)  Utilities: Not At Risk (08/05/2023)  Alcohol Screen: Low Risk  (05/19/2023)  Depression (PHQ2-9): Low Risk  (01/20/2023)  Financial Resource Strain: Low Risk  (05/19/2023)  Physical Activity: Sufficiently Active (05/19/2023)  Social Connections: Socially Isolated (07/27/2023)  Stress: No Stress Concern Present (05/19/2023)  Tobacco Use: Medium Risk (08/04/2023)   SDOH Interventions:     Readmission Risk Interventions     No data to display

## 2023-08-06 NOTE — [place] FL2 (Signed)
  MEDICAID [id number] LEVEL OF CARE FORM     IDENTIFICATION  Patient Name: Johnathan Arnold. Birthdate: January 03, 1946 Sex: male Admission Date (Current Location): 08/04/2023  [facility] and [place] Number:  Chiropodist and Address:         Provider Number: [id number]  Attending Physician Name and Address:  Darlin Priestly, MD  Relative Name and Phone Number:       Current Level of Care: Hospital Recommended Level of Care: Skilled Nursing Facility Prior Approval Number:    Date Approved/Denied:   PASRR Number: [id number]  Discharge Plan: SNF    Current Diagnoses: Patient Active Problem List   Diagnosis Date Noted   Diverticulitis 07/26/2023   RSV (respiratory syncytial virus pneumonia) 07/26/2023   UTI (urinary tract infection) 07/02/2023   Impaired ambulation 07/02/2023   Orthostatic hypotension 05/22/2023   Generalized weakness 05/07/2021   Bradycardia    Anemia, unspecified 02/07/2021   Neutropenia (HCC) 01/29/2021   Thrombocytopenia (HCC) 01/29/2021   Right lower lobe pneumonia 12/28/2020   Hyponatremia 12/22/2020   Bilateral leg weakness 12/20/2020   Prostate cancer screening 09/08/2020   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) 09/06/2020   Low back pain 08/19/2019   Pulmonary hypertension (HCC)    Insomnia 12/21/2018   Sleep apnea in adult 12/09/2018   Periodic limb movement disorder 12/09/2018   Pulmonary nodules 05/18/2018   Wears hearing aid in both ears 05/17/2018   Leg pain, bilateral 02/20/2018   Dyspnea    CKD stage 3a, GFR 45-59 ml/min (HCC) 09/21/2017   History of skin cancer in adulthood 08/14/2016   Parkinson's disease (HCC) 08/09/2016   Bilateral carotid artery stenosis 04/02/2015   Vertigo, peripheral 10/17/2014   Benign prostatic hyperplasia with urinary frequency 01/31/2014   Obesity 04/03/2013   Other malaise and fatigue 09/21/2012   Hyperlipidemia    Essential hypertension    3-vessel coronary artery  disease    S/P CABG x 5     Orientation RESPIRATION BLADDER Height & Weight     Self, Time, Situation, Place  Normal Continent Weight: 79.4 kg Height:  5\' 6"  (167.6 cm)  BEHAVIORAL SYMPTOMS/MOOD NEUROLOGICAL BOWEL NUTRITION STATUS      Incontinent Diet (carb modified heart healthy)  AMBULATORY STATUS COMMUNICATION OF NEEDS Skin   Limited Assist Verbally Bruising                       Personal Care Assistance Level of Assistance              Functional Limitations Info             SPECIAL CARE FACTORS FREQUENCY  PT (By licensed PT), OT (By licensed OT)                    Contractures Contractures Info: Not present    Additional Factors Info  Code Status, Allergies, Isolation Precautions Code Status Info: Full Allergies Info: Paxlovid     Isolation Precautions Info: Droplet     Current Medications (08/06/2023):  This is the current hospital active medication list Current Facility-Administered Medications  Medication Dose Route Frequency Provider Last Rate Last Admin   carbidopa-levodopa (SINEMET IR) 25-100 MG per tablet immediate release 2 tablet  2 tablet Oral TID Floydene Flock, MD   2 tablet at 08/06/23 1215   And   carbidopa-levodopa (SINEMET IR) 25-100 MG per tablet immediate release 1 tablet  1 tablet Oral Daily Doree Albee  J, MD   1 tablet at 08/05/23 1753   enoxaparin (LOVENOX) injection 40 mg  40 mg Subcutaneous Q24H Floydene Flock, MD   40 mg at 08/06/23 0840   hydrALAZINE (APRESOLINE) injection 10 mg  10 mg Intravenous Q4H PRN Floydene Flock, MD   10 mg at 08/05/23 1438   hydrALAZINE (APRESOLINE) tablet 100 mg  100 mg Oral Q8H Darlin Priestly, MD   100 mg at 08/06/23 0840   isosorbide mononitrate (IMDUR) 24 hr tablet 60 mg  60 mg Oral Daily Darlin Priestly, MD   60 mg at 08/06/23 0840   olopatadine (PATANOL) 0.1 % ophthalmic solution 1 drop  1 drop Both Eyes BID Darlin Priestly, MD   1 drop at 08/06/23 1216   ondansetron (ZOFRAN) tablet 4 mg  4 mg Oral  Q6H PRN Floydene Flock, MD       Or   ondansetron [facility]) injection 4 mg  4 mg Intravenous Q6H PRN Floydene Flock, MD       tamsulosin [facility]) capsule 0.4 mg  0.4 mg Oral Daily Floydene Flock, MD   0.4 mg at 08/06/23 0840     Discharge Medications: Please see discharge summary for a list of discharge medications.  Relevant Imaging Results:  Relevant Lab Results:   Additional Information SS#: [id number]  Chapman Fitch, RN

## 2023-08-06 NOTE — Care Management Obs Status (Signed)
 MEDICARE OBSERVATION STATUS NOTIFICATION   Patient Details  Name: Johnathan Arnold. MRN: [id number] Date of Birth: Aug 02, 1945   Medicare Observation Status Notification Given:  Orland Dec, CMA 08/06/2023, 10:26 AM

## 2023-08-06 NOTE — Evaluation (Signed)
 Occupational Therapy Evaluation Patient Details Name: Johnathan Arnold. MRN: [id number] DOB: [birth year] Today's Date: 08/06/2023   History of Present Illness   Pt is a 78 y.o. male presenting to hospital 08/04/23 with c/o generalized weakness. Recent hospitalizations with RSV, PNA, and diverticulitis.  Pt admitted with generalized weakness, encephalopathy.  PMH includes CAD s/p CABG, htn, DM, HLD, Parkinson's, vertigo.     Clinical Impressions Pt seen for OT eval, handoff from PT session. Pt pleasant and participatory. Pt lives at home with spouse, 1 recent fall since last hospitalization. PTA, pt uses RW for mobility and is mod independent with ADLs. Pt requiring CGA for UB/LB bathing + dressing, mobility, toileting and grooming tasks.  Able to complete bathing in standing with CGA for safety, dons underwear seated and able to stand to pull over hips CGA. Good stability and control throughout with anterior weight shift during transfers. P Pt would benefit from skilled OT services to address noted impairments and functional limitations (see below for any additional details) in order to maximize safety and independence while minimizing falls risk and caregiver burden. Anticipate the need for follow up [facility] OT services upon acute hospital DC (pt would additionally benefit from outpatient OT to address deficits in strength, GMC/FMC).      If plan is discharge home, recommend the following:   Assist for transportation;Assistance with cooking/housework;A little help with walking and/or transfers;Help with stairs or ramp for entrance;A little help with bathing/dressing/bathroom     Functional Status Assessment   Patient has had a recent decline in their functional status and demonstrates the ability to make significant improvements in function in a reasonable and predictable amount of time.     Equipment Recommendations   None recommended by OT     Recommendations for Other Services          Precautions/Restrictions   Precautions Precautions: Fall Restrictions Weight Bearing Restrictions Per Provider Order: No     Mobility Bed Mobility               General bed mobility comments: NT, pt recieved and left in recliner    Transfers Overall transfer level: Needs assistance Equipment used: Rolling walker (2 wheels) Transfers: Sit to/from Stand, Bed to chair/wheelchair/BSC Sit to Stand: Contact guard assist     Step pivot transfers: Contact guard assist     General transfer comment: good carryover of anterior weight shift training from previous session with PT, CGA for safety, pt performing multiple STS transfers from recliner and BSC during session with good control      Balance Overall balance assessment: Needs assistance Sitting-balance support: No upper extremity supported, Feet supported Sitting balance-Leahy Scale: Good     Standing balance support: Bilateral upper extremity supported, Reliant on assistive device for balance Standing balance-Leahy Scale: Good                             ADL either performed or assessed with clinical judgement   ADL Overall ADL's : Needs assistance/impaired     Grooming: Wash/dry hands;Wash/dry face;Oral care;Standing Grooming Details (indicate cue type and reason): standing at sink with CGA after toileting tasks Upper Body Bathing: Standing;Set up Upper Body Bathing Details (indicate cue type and reason): CGA for standing balance, no LOB Lower Body Bathing: Set up;Sit to/from stand Lower Body Bathing Details (indicate cue type and reason): CGA for dynamic standing balance, stands to perform with unilateral support on RW. Upper Body  Dressing : Standing;Minimal assistance Upper Body Dressing Details (indicate cue type and reason): dons gown Lower Body Dressing: Supervision/safety;Sit to/from stand Lower Body Dressing Details (indicate cue type and reason): able to don underwear with  supervision Toilet Transfer: BSC/3in1;Ambulation;Rolling walker (2 wheels);Contact guard assist Toilet Transfer Details (indicate cue type and reason): functional mobility using RW t/f bathroom, CGA for safety, transfer with BSC over commode, good use of grab bars for stability Toileting- Clothing Manipulation and Hygiene: Sit to/from stand;Supervision/safety Toileting - Clothing Manipulation Details (indicate cue type and reason): able to manage gown and urinal with supervision in standing, pulls up underwear and performs pericare using lateral leans     Functional mobility during ADLs: Contact guard assist;Rolling walker (2 wheels)        Pertinent Vitals/Pain Pain Assessment Pain Assessment: No/denies pain Faces Pain Scale: No hurt Breathing: normal Negative Vocalization: none Facial Expression: smiling or inexpressive Body Language: relaxed Consolability: no need to console PAINAD Score: 0     Extremity/Trunk Assessment Upper Extremity Assessment Upper Extremity Assessment: Generalized weakness;RUE deficits/detail;LUE deficits/detail;Right hand dominant (PD at baseline) RUE Coordination: decreased gross motor;decreased fine motor LUE Coordination: decreased fine motor;decreased gross motor   Lower Extremity Assessment Lower Extremity Assessment: Generalized weakness       Communication Communication Communication: Impaired Factors Affecting Communication: Hearing impaired   Cognition Arousal: Alert Behavior During Therapy: WFL for tasks assessed/performed Cognition: No apparent impairments                               Following commands: Intact       Cueing  General Comments   Cueing Techniques: Verbal cues              Home Living Family/patient expects to be discharged to:: Private residence Living Arrangements: Spouse/significant other Available Help at Discharge: Family;Available 24 hours/day Type of Home: House Home Access: Stairs  to enter [employer] of Steps: 1 Entrance Stairs-Rails: None Home Layout: Two level;Able to live on main level with bedroom/bathroom Alternate Level Stairs-Number of Steps: 15 Alternate Level Stairs-Rails: Left;Right Bathroom Shower/Tub: Producer, television/film/video: Standard Bathroom Accessibility: Yes How Accessible: Accessible via walker Home Equipment: Agricultural consultant (2 wheels);Cane - quad;Shower seat;Hand held shower head          Prior Functioning/Environment Prior Level of Function : Independent/Modified Independent;History of Falls (last six months)             Mobility Comments: Pt's wife reports pt with 1 fall in brief time pt was at home between hospitalizations (pt does not remember fall; neighbors came to assist pt); h/o falls; was working with OPPT at [employer].  Pt typically uses RW. ADLs Comments: Independent with ADL's    OT Problem List: Decreased strength;Decreased activity tolerance;Impaired balance (sitting and/or standing);Decreased knowledge of use of DME or AE   OT Treatment/Interventions: Self-care/ADL training;Therapeutic exercise;Therapeutic activities;Patient/family education;Balance training;DME and/or AE instruction      OT Goals(Current goals can be found in the care plan section)   Acute Rehab OT Goals Patient Stated Goal: to go home OT Goal Formulation: With patient/family Time For Goal Achievement: 08/20/23 Potential to Achieve Goals: Good   OT Frequency:  Min 1X/week       AM-PAC OT "6 Clicks" Daily Activity     Outcome Measure Help from another person eating meals?: None Help from another person taking care of personal grooming?: A Little Help from another person toileting, which  includes using toliet, bedpan, or urinal?: A Little Help from another person bathing (including washing, rinsing, drying)?: A Little Help from another person to put on and taking off regular upper body clothing?: A Little Help from another  person to put on and taking off regular lower body clothing?: A Little 6 Click Score: 19   End of Session Equipment Utilized During Treatment: Rolling walker (2 wheels);Gait belt Nurse Communication: Mobility status  Activity Tolerance: Patient tolerated treatment well Patient left: in chair;with call bell/phone within reach;with family/visitor present;with chair alarm set  OT Visit Diagnosis: Other abnormalities of gait and mobility (R26.89);Repeated falls (R29.6);Muscle weakness (generalized) (M62.81);Unsteadiness on feet (R26.81)                Time: 1610-9604 OT Time Calculation (min): 39 min Charges:  OT General Charges $OT Visit: 1 Visit OT Evaluation $OT Eval Low Complexity: 1 Low OT Treatments $Self Care/Home Management : 23-37 mins Dozier Berkovich L. Governor Matos, OTR/L  08/06/23, 3:12 PM

## 2023-08-06 NOTE — Evaluation (Signed)
 Physical Therapy Evaluation Patient Details Name: Johnathan Arnold. MRN: [id number] DOB: August 15, 1945 Today's Date: 08/06/2023  History of Present Illness  Pt is a 78 y.o. male presenting to hospital 08/04/23 with c/o generalized weakness. Recent hospitalizations with RSV, PNA, and diverticulitis.  Pt admitted with generalized weakness, encephalopathy.  PMH includes CAD s/p CABG, htn, DM, HLD, Parkinson's, vertigo.  Clinical Impression  Prior to recent medical concerns and hospitalizations, pt was ambulatory (typically with RW); lives with his wife on main level of home with 1 STE (no railing); h/o falls (including 1 fall since recent hospital discharge).  Currently pt is SBA semi-supine to sitting EOB; CGA with transfers with RW use (pt standing with weight through his heels but able to self correct); and CGA to ambulate 120 feet with RW use.  Pt would currently benefit from skilled PT to address noted impairments and functional limitations (see below for any additional details).  Upon hospital discharge, pt would benefit from ongoing therapy.     If plan is discharge home, recommend the following: A little help with walking and/or transfers;A little help with bathing/dressing/bathroom;Assistance with cooking/housework;Assist for transportation;Help with stairs or ramp for entrance   Can travel by private vehicle        Equipment Recommendations None recommended by PT (Pt has RW at home already)  Recommendations for Other Services       Functional Status Assessment Patient has had a recent decline in their functional status and demonstrates the ability to make significant improvements in function in a reasonable and predictable amount of time.     Precautions / Restrictions Precautions Precautions: Fall Restrictions Weight Bearing Restrictions Per Provider Order: No      Mobility  Bed Mobility Overal bed mobility: Needs Assistance Bed Mobility: Supine to Sit     Supine to sit:  Supervision, HOB elevated     General bed mobility comments: mild increased effort for pt to perform on own    Transfers Overall transfer level: Needs assistance Equipment used: Rolling walker (2 wheels) Transfers: Sit to/from Stand, Bed to chair/wheelchair/BSC Sit to Stand: Contact guard assist   Step pivot transfers: Contact guard assist (stand step turn bed to recliner with RW use)       General transfer comment: x1 trial from bed and x1 trial from recliner (standing); pt tending to stand with weight through his heels but pt able to self correct; vc's for anterior weight shift when standing    Ambulation/Gait Ambulation/Gait assistance: Contact guard assist, Supervision Gait Distance (Feet): 120 Feet Assistive device: Rolling walker (2 wheels) Gait Pattern/deviations: Step-through pattern, Decreased step length - right, Decreased step length - left, Shuffle, Trunk flexed Gait velocity: decreased     General Gait Details: CGA progressing to SBA; steady ambulating with RW use  Stairs            Wheelchair Mobility     Tilt Bed    Modified Rankin (Stroke Patients Only)       Balance Overall balance assessment: Needs assistance Sitting-balance support: No upper extremity supported, Feet supported Sitting balance-Leahy Scale: Good Sitting balance - Comments: steady reaching within BOS   Standing balance support: Bilateral upper extremity supported, Reliant on assistive device for balance Standing balance-Leahy Scale: Good Standing balance comment: steady ambulating with RW use                             Pertinent Vitals/Pain Pain Assessment Pain Assessment:  No/denies pain Pain Intervention(s): Limited activity within patient's tolerance, Monitored during session At rest in bed beginning of session: BP 151/62 (90) with HR 67 bpm; after getting to recliner BP 150/64 (89) with HR 69 bpm in sitting; and post ambulation BP 138/59 (81) with HR 75 bpm  in sitting.  Pt asymptomatic throughout session.    Home Living Family/patient expects to be discharged to:: Private residence Living Arrangements: Spouse/significant other Available Help at Discharge: Family;Available 24 hours/day Type of Home: House Home Access: Stairs to enter Entrance Stairs-Rails: None Entrance Stairs-Number of Steps: 1   Home Layout: Two level;Able to live on main level with bedroom/bathroom Home Equipment: Rolling Walker (2 wheels);Cane - quad;Shower seat;Hand held shower head      Prior Function Prior Level of Function : Independent/Modified Independent;History of Falls (last six months)             Mobility Comments: Pt's wife reports pt with 1 fall in brief time pt was at home between hospitalizations (pt does not remember fall; neighbors came to assist pt); h/o falls; was working with OPPT at [facility] [employer] [facility].  Pt typically uses RW. ADLs Comments: Independent with ADL's     Extremity/Trunk Assessment   Upper Extremity Assessment Upper Extremity Assessment: Generalized weakness    Lower Extremity Assessment Lower Extremity Assessment: Generalized weakness       Communication   Communication Communication: Impaired Factors Affecting Communication: Hearing impaired (HOH)    Cognition Arousal: Alert Behavior During Therapy: WFL for tasks assessed/performed   PT - Cognitive impairments: No apparent impairments                         Following commands: Intact       Cueing Cueing Techniques: Verbal cues     General Comments  Nursing cleared pt for participation in physical therapy.  Pt agreeable to PT session.  Pt's wife and son present during session.    Exercises     Assessment/Plan    PT Assessment Patient needs continued PT services  PT Problem List Decreased strength;Decreased activity tolerance;Decreased balance;Decreased mobility       PT Treatment Interventions DME instruction;Gait training;Stair training;Functional  mobility training;Therapeutic activities;Therapeutic exercise;Balance training;Patient/family education    PT Goals (Current goals can be found in the Care Plan section)  Acute Rehab PT Goals Patient Stated Goal: to go home PT Goal Formulation: With patient Time For Goal Achievement: 08/20/23 Potential to Achieve Goals: Good    Frequency Min 1X/week     Co-evaluation               AM-PAC PT "6 Clicks" Mobility  Outcome Measure Help needed turning from your back to your side while in a flat bed without using bedrails?: None Help needed moving from lying on your back to sitting on the side of a flat bed without using bedrails?: A Little Help needed moving to and from a bed to a chair (including a wheelchair)?: A Little Help needed standing up from a chair using your arms (e.g., wheelchair or bedside chair)?: A Little Help needed to walk in hospital room?: A Little Help needed climbing 3-5 steps with a railing? : A Little 6 Click Score: 19    End of Session Equipment Utilized During Treatment: Gait belt Activity Tolerance: Patient tolerated treatment well Patient left: in chair;with call bell/phone within reach;with family/visitor present (OT present for OT evaluation) Nurse Communication: Mobility status;Precautions PT Visit Diagnosis: Other abnormalities of gait and mobility (  R26.89);Muscle weakness (generalized) (M62.81);History of falling (Z91.81)    Time: 4098-1191 PT Time Calculation (min) (ACUTE ONLY): 20 min   Charges:   PT Evaluation $PT Eval Low Complexity: 1 Low PT Treatments $Therapeutic Activity: 8-22 mins PT General Charges $$ ACUTE PT VISIT: 1 Visit        Hendricks Limes, PT 08/06/23, 1:57 PM

## 2023-08-07 DIAGNOSIS — R531 Weakness: Secondary | ICD-10-CM | POA: Diagnosis not present

## 2023-08-07 DIAGNOSIS — M6281 Muscle weakness (generalized): Secondary | ICD-10-CM | POA: Diagnosis not present

## 2023-08-07 LAB — BASIC METABOLIC PANEL
Anion gap: 8 (ref 5–15)
BUN: 12 mg/dL (ref 8–23)
CO2: 22 mmol/L (ref 22–32)
Calcium: 8.7 mg/dL — ABNORMAL LOW (ref 8.9–10.3)
Chloride: 109 mmol/L (ref 98–111)
Creatinine, Ser: 1.23 mg/dL (ref 0.61–1.24)
GFR, Estimated: >60 mL/min (ref >60)
Glucose, Bld: 97 mg/dL (ref 70–99)
Potassium: 3.6 mmol/L (ref 3.5–5.1)
Sodium: 139 mmol/L (ref 135–145)

## 2023-08-07 LAB — MAGNESIUM: Magnesium: 1.7 mg/dL (ref 1.7–2.4)

## 2023-08-07 MED ORDER — ROPINIROLE HCL 1 MG PO TABS: 1.0000 mg | ORAL_TABLET | Freq: Every day | ORAL | Status: DC | PRN

## 2023-08-07 MED ORDER — HYDRALAZINE HCL 100 MG PO TABS: 100.0000 mg | ORAL_TABLET | Freq: Three times a day (TID) | ORAL | Status: DC

## 2023-08-07 NOTE — Plan of Care (Signed)
  Problem: Education: Goal: Knowledge of General Education information will improve Description: Including pain rating scale, medication(s)/side effects and non-pharmacologic comfort measures Outcome: Adequate for Discharge   Problem: Health Behavior/Discharge Planning: Goal: Ability to manage health-related needs will improve Outcome: Adequate for Discharge   Problem: Clinical Measurements: Goal: Ability to maintain clinical measurements within normal limits will improve Outcome: Adequate for Discharge Goal: Will remain free from infection Outcome: Adequate for Discharge Goal: Diagnostic test results will improve Outcome: Adequate for Discharge Goal: Respiratory complications will improve Outcome: Adequate for Discharge Goal: Cardiovascular complication will be avoided Outcome: Adequate for Discharge   Problem: Activity: Goal: Risk for activity intolerance will decrease Outcome: Adequate for Discharge   Problem: Nutrition: Goal: Adequate nutrition will be maintained Outcome: Adequate for Discharge   Problem: Coping: Goal: Level of anxiety will decrease Outcome: Adequate for Discharge   Problem: Elimination: Goal: Will not experience complications related to bowel motility Outcome: Adequate for Discharge Goal: Will not experience complications related to urinary retention Outcome: Adequate for Discharge   Problem: Pain Managment: Goal: General experience of comfort will improve and/or be controlled Outcome: Adequate for Discharge   Problem: Safety: Goal: Ability to remain free from injury will improve Outcome: Adequate for Discharge   Problem: Skin Integrity: Goal: Risk for impaired skin integrity will decrease Outcome: Adequate for Discharge   Problem: Acute Rehab PT Goals(only PT should resolve) Goal: Pt Will Go Supine/Side To Sit Outcome: Adequate for Discharge Goal: Patient Will Transfer Sit To/From Stand Outcome: Adequate for Discharge Goal: Pt Will  Transfer Bed To Chair/Chair To Bed Outcome: Adequate for Discharge Goal: Pt Will Ambulate Outcome: Adequate for Discharge Goal: Pt Will Go Up/Down Stairs Outcome: Adequate for Discharge   Problem: Acute Rehab OT Goals (only OT should resolve) Goal: Pt. Will Perform Grooming Outcome: Adequate for Discharge Goal: Pt. Will Perform Upper Body Bathing Outcome: Adequate for Discharge Goal: Pt. Will Perform Lower Body Dressing Outcome: Adequate for Discharge Goal: Pt. Will Transfer To Toilet Outcome: Adequate for Discharge Goal: Pt. Will Perform Toileting-Clothing Manipulation Outcome: Adequate for Discharge

## 2023-08-07 NOTE — Telephone Encounter (Signed)
Called and left patient a voicemail message 

## 2023-08-07 NOTE — TOC Transition Note (Signed)
 Transition of Care [facility]) - Discharge Note   Patient Details  Name: Johnathan Arnold. MRN: [id number] Date of Birth: 03-12-1946  Transition of Care [facility]) CM/SW Contact:  Chapman Fitch, RN Phone Number: 08/07/2023, 10:12 AM   Clinical Narrative:      Patient will DC to: [place] Anticipated DC date: 08/07/23  Family notified: son Building control surveyor by: Family  Per MD patient ready for DC to . RN, patient, patient's family, and facility notified of DC. Discharge Summary sent to facility..  TOC signing off.        Patient Goals and CMS Choice            Discharge Placement                       Discharge Plan and Services Additional resources added to the After Visit Summary for                                       Social Drivers of Health (SDOH) Interventions SDOH Screenings   Food Insecurity: No Food Insecurity (08/05/2023)  Housing: Low Risk  (08/05/2023)  Transportation Needs: No Transportation Needs (08/06/2023)  Utilities: Not At Risk (08/05/2023)  Alcohol Screen: Low Risk  (05/19/2023)  Depression (PHQ2-9): Low Risk  (01/20/2023)  Financial Resource Strain: Low Risk  (05/19/2023)  Physical Activity: Sufficiently Active (05/19/2023)  Social Connections: Socially Isolated (07/27/2023)  Stress: No Stress Concern Present (05/19/2023)  Tobacco Use: Medium Risk (08/04/2023)     Readmission Risk Interventions     No data to display

## 2023-08-07 NOTE — Discharge Summary (Signed)
 Physician Discharge Summary   Johnathan Arnold.  male DOB: 10/17/1945  ZOX:[id number]  PCP: [name], MD  Admit date: 08/04/2023 Discharge date: 08/07/2023  Admitted From: home Disposition:  SNF rehab CODE STATUS: Full code   Hospital Course:  For full details, please see H&P, progress notes, consult notes and ancillary notes.  Briefly,  Johnathan Arnold. is a 78 y.o. male with medical history significant of CAD status post CABG, type 2 diabetes, Parkinson disease, hypertension presenting with weakness.     Patient noted to have been admitted 07/26/23, 07/02/23 for weakness and were treated for issues including right lower lobe pneumonia, RSV, diverticulitis.  Per the son, patient has had progressively worsening weakness and fatigue at home.  Has grown more lethargic over the past 1 to 2 days PTA with patient not getting out of bed.    Hypoactive delirium --pt more lethargic and confused since discharge from last hospitalization.  Of note, pt has been taking Klonopin for restless leg for a long time, which may exacerbate delirium. No acute infectious process noted on imaging or lab analysis thus far.  Nonfocal neuro exam. --home Klonopin held.  Pt was alert and oriented next morning. --Klonopin d/c'ed at discharge.   * Generalized weakness --had multiple recent hospitalizations.  --PT eval rec home with HH, however, family would like to get pt to SNF rehab with self pay.   Parkinson's disease (HCC) Continue Sinemet   Restless legs --leg movement could be due to Parkinson's. --Klonopin d/c'ed at discharge. --start Requip PRN   CKD stage 3a, GFR 45-59 ml/min (HCC) At baseline   Essential hypertension --BP elevated --cont home Imdur --increase hydralazine to 100 mg q8h   RSV (respiratory syncytial virus pneumonia) --Dx on 07/26/23. Current CT Imaging negative for any focal infiltrate.  No hypoxia or respiratory distress.   S/P CABG x 5 No active chest pain  at present. --Pt not taking Ranexa PTA. 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  30 Day Unplanned Readmission Risk Score (%) 16.66 Filed at 07/31/2023 1200       This score is the patient's risk of an unplanned readmission within 30 days of being discharged (0 -100%). The score is based on dignosis, age, lab data, medications, orders, and past utilization.   Low:  0-14.9   Medium: 15-21.9   High: 22-29.9   Extreme: 30 and above         Discharge Instructions:  Allergies as of 08/07/2023       Reactions   Paxlovid [nirmatrelvir-ritonavir]         Medication List     STOP taking these medications    chlorpheniramine-HYDROcodone 10-8 MG/5ML Commonly known as: TUSSIONEX   clonazePAM 1 MG tablet Commonly known as: KLONOPIN   ranolazine 1000 MG SR tablet Commonly known as: RANEXA       TAKE these medications    acetaminophen 500 MG tablet Commonly known as: TYLENOL Take 500 mg by mouth as needed.   aspirin EC 81 MG tablet Take 1 tablet (81 mg total) by  mouth at bedtime.   carbidopa-levodopa 25-100 MG tablet Commonly known as: SINEMET IR TAKE 2 TABLETS BY MOUTH AT 8AM; TAKE 2 TABLETS BY MOUTH AT 11AM; TAKE 2 TABLETS BY MOUTH AT 2PM.; TAKE 1 TABLET BY MOUTH AT 5PM.   furosemide 20 MG tablet Commonly known as: LASIX Take 1 tablet (20 mg total) by mouth daily as needed (for swelling).   hydrALAZINE 100 MG tablet Commonly known as: APRESOLINE Take 1 tablet (100 mg total) by mouth 3 (three) times daily. What changed:  medication strength how much  to take when to take this reasons to take this   hydrocortisone cream 1 % Apply topically 2 (two) times daily.   isosorbide mononitrate 60 MG 24 hr tablet Commonly known as: IMDUR TAKE 1 TABLET BY MOUTH DAILY   melatonin 3 MG Tabs tablet Take 1.5 mg by mouth at bedtime as needed.   multivitamin tablet Take 1 tablet by mouth daily.   nitroGLYCERIN 0.4 MG SL tablet Commonly known as: NITROSTAT Place 1 tablet (0.4 mg total) under the tongue every 5 (five) minutes as needed for chest pain.   olopatadine 0.1 % ophthalmic solution Commonly known as: PATANOL Place 1 drop into both eyes 2 (two) times daily.   omeprazole 20 MG capsule Commonly known as: PRILOSEC Take 1 capsule (20 mg total) by mouth every morning.   ondansetron 4 MG tablet Commonly known as: ZOFRAN Take 1 tablet (4 mg total) by mouth every 6 (six) hours as needed for nausea or vomiting.   polyethylene glycol 17 g packet Commonly known as: MIRALAX / GLYCOLAX Take 17 g by mouth daily as needed.   rOPINIRole 1 MG tablet Commonly known as: REQUIP Take 1 tablet (1 mg total) by mouth daily as needed (restless legs).   rosuvastatin 10 MG tablet Commonly known as: CRESTOR TAKE 1 TABLET BY MOUTH DAILY   tamsulosin 0.4 MG Caps capsule Commonly known as: FLOMAX Take 1 capsule (0.4 mg total) by mouth daily.         Contact information for follow-up providers     [name], MD Follow up.   Specialty: Internal Medicine Contact information: [address] [place] [address] [phone]              Contact information for after-discharge care     Destination     [facility]erred [facility] .   Service: Skilled Nursing Contact information: [address] [place] [address] [phone]                     Allergies  Allergen Reactions   Paxlovid [Nirmatrelvir-Ritonavir]      The results of significant diagnostics from this  hospitalization (including imaging, microbiology, ancillary and laboratory) are listed below for reference.   Consultations:   Procedures/Studies: CT HEAD WO CONTRAST ( ) Result Date: 08/05/2023 CLINICAL DATA:  Mental status change EXAM: CT HEAD WITHOUT CONTRAST TECHNIQUE: Contiguous axial images were obtained from the base of the skull through the vertex without intravenous contrast. RADIATION DOSE REDUCTION: This exam was performed according to the departmental dose-optimization program which includes automated exposure control, adjustment of the mA and/or kV according to patient size and/or use of iterative reconstruction technique. COMPARISON:  MRI brain 03/30/2023 FINDINGS: Brain: No evidence of acute infarction, hemorrhage, hydrocephalus, extra-axial collection or mass lesion/mass effect. There is mild periventricular white matter hypodensity, likely chronic small vessel ischemic change. Note is made that there has been recent contrast administration.  Vascular: Atherosclerotic calcifications are present within the cavernous internal carotid arteries. Skull: Normal. Negative for fracture or focal lesion. Sinuses/Orbits: Mucosal thickening and air-fluid levels are seen in the right frontal and right maxillary sinus. There is mucosal thickening of the left frontal sinus, sphenoid sinuses and ethmoid air cells. Mastoid air cells are clear. Orbits are within normal limits. Other: None. IMPRESSION: 1. No acute intracranial process. 2. Mild chronic small vessel ischemic changes. 3. Acute right frontal and maxillary sinusitis. Electronically Signed   By: Darliss Cheney M.D.   On: 08/05/2023 00:59   CT CHEST ABDOMEN PELVIS W CONTRAST Result Date: 08/05/2023 CLINICAL DATA:  Sepsis EXAM: CT CHEST, ABDOMEN, AND PELVIS WITH CONTRAST TECHNIQUE: Multidetector CT imaging of the chest, abdomen and pelvis was performed following the standard protocol during bolus administration of intravenous contrast. RADIATION DOSE  REDUCTION: This exam was performed according to the departmental dose-optimization program which includes automated exposure control, adjustment of the mA and/or kV according to patient size and/or use of iterative reconstruction technique. CONTRAST:  OMNIPAQUE IOHEXOL 300 MG/ML  SOLN COMPARISON:  CT chest 07/27/2023. CT abdomen and pelvis 07/26/2023 CT of the chest 05/07/2021. FINDINGS: CT CHEST FINDINGS Cardiovascular: Heart is mildly enlarged. Aorta is normal in size. Patient is status post cardiac surgery. There are atherosclerotic calcifications of the aorta. There is no pericardial effusion. Mediastinum/Nodes: There is a 3 mm hypodense right thyroid nodule which is unchanged. There is an enlarged subcarinal lymph node measuring 1 cm which has decreased in size. No other enlarged lymph nodes are seen. Lungs/Pleura: There are trace bilateral pleural effusions similar to the prior study. There is a stable 5 mm right apical nodule. Previously identified patchy airspace opacities in the right lower lobe have resolved. Stable focal nodular density measuring 7 mm in the left lung base. These are unchanged from 2022 and favored as benign. No pneumothorax. No new focal lung consolidation. Musculoskeletal: Sternotomy wires are present. No acute fractures are seen. CT ABDOMEN PELVIS FINDINGS Hepatobiliary: No focal liver abnormality is seen. No gallstones, gallbladder wall thickening, or biliary dilatation. Pancreas: There is a fat containing lesion in the tail of the pancreas which is rounded measuring 9 mm compatible with lipoma, unchanged. Otherwise, the pancreas appears within normal limits. Spleen: Normal in size without focal abnormality. Adrenals/Urinary Tract: Adrenal glands are unremarkable. Kidneys are normal, without renal calculi, focal lesion, or hydronephrosis. Bladder is unremarkable. Stomach/Bowel: Stomach is within normal limits. Appendix appears normal. No evidence of bowel wall thickening,  distention, or inflammatory changes. There is diffuse colonic diverticulosis. Vascular/Lymphatic: Aortic atherosclerosis. No enlarged abdominal or pelvic lymph nodes. Reproductive: Prostate gland is heterogeneous and enlarged. Other: There is trace free fluid in the pelvis. No focal abdominal wall hernia. Musculoskeletal: Degenerative changes affect the spine and hips. IMPRESSION: 1. No acute localizing process in the chest, abdomen or pelvis. 2. Stable trace bilateral pleural effusions. 3. Stable pancreatic lipoma. 4. Colonic diverticulosis. 5. Trace free fluid in the pelvis. Aortic Atherosclerosis (ICD10-I70.0). Electronically Signed   By: Darliss Cheney M.D.   On: 08/05/2023 00:03   DG Chest 2 View Result Date: 08/04/2023 CLINICAL DATA:  Weakness EXAM: CHEST - 2 VIEW COMPARISON:  X-ray 07/26/2023 and older.  CT 07/27/2023. FINDINGS: Sternal wires. No consolidation, pneumothorax or effusion. Normal cardiopericardial silhouette. Mild interstitial prominence. Degenerative changes of the spine. IMPRESSION: Postop chest.  Chronic changes. Electronically Signed   By: Karen Kays M.D.   On: 08/04/2023 20:28   CT CHEST W CONTRAST Result Date:  07/27/2023 CLINICAL DATA:  Concern for a right lower lobe pleural mass. EXAM: CT CHEST WITH CONTRAST TECHNIQUE: Multidetector CT imaging of the chest was performed during intravenous contrast administration. RADIATION DOSE REDUCTION: This exam was performed according to the departmental dose-optimization program which includes automated exposure control, adjustment of the mA and/or kV according to patient size and/or use of iterative reconstruction technique. CONTRAST:  75mL OMNIPAQUE IOHEXOL 300 MG/ML  SOLN COMPARISON:  CT abdomen pelvis dated 07/26/2023 and chest CT dated 05/07/2021. FINDINGS: Cardiovascular: There is no cardiomegaly or pericardial effusion. Advanced coronary vascular calcification and postsurgical changes of CABG. Mild atherosclerotic calcification of the  thoracic aorta. No intimal dilatation or dissection. The origins of the great vessels of the aortic arch appear patent. No pulmonary artery embolus identified. Mediastinum/Nodes: No hilar adenopathy. Enlarged subcarinal lymph node measures 18 mm short axis. The esophagus is grossly unremarkable. Subcentimeter right thyroid hypodense nodule. Not clinically significant; no follow-up imaging recommended (ref: J Am Coll Radiol. 2015 Feb;12(2): 143-50).No mediastinal fluid collection. Lungs/Pleura: Small bilateral pleural effusions with minimal compressive atelectasis of the adjacent lungs. There is a cluster of ground-glass airspace opacity in the right lower lobe and right lung base consistent with pneumonia. Aspiration is not excluded. Clinical correlation and follow-up to resolution recommended. There is no pneumothorax. The central airways are patent. Upper Abdomen: Small gallstones. Musculoskeletal: Osteopenia with degenerative changes of the spine. Median sternotomy wires. No acute osseous pathology. IMPRESSION: 1. Right lower lobe pneumonia. Clinical correlation and follow-up to resolution recommended. 2. Small bilateral pleural effusions. 3. Cholelithiasis. 4.  Aortic Atherosclerosis (ICD10-I70.0). Electronically Signed   By: Elgie Collard M.D.   On: 07/27/2023 19:02   CT ABDOMEN PELVIS W CONTRAST Result Date: 07/26/2023 CLINICAL DATA:  78 year old male with history of left lower quadrant abdominal pain. Generalized weakness. EXAM: CT ABDOMEN AND PELVIS WITH CONTRAST TECHNIQUE: Multidetector CT imaging of the abdomen and pelvis was performed using the standard protocol following bolus administration of intravenous contrast. RADIATION DOSE REDUCTION: This exam was performed according to the departmental dose-optimization program which includes automated exposure control, adjustment of the mA and/or kV according to patient size and/or use of iterative reconstruction technique. CONTRAST:  75mL OMNIPAQUE  IOHEXOL 300 MG/ML  SOLN COMPARISON:  CT of the abdomen and pelvis 11/21/2020. FINDINGS: Lower chest: Mass-like area in the right lower lobe (axial image 4 of series 4) measuring 3.2 x 2.4 cm with some surrounding ground-glass attenuation and septal thickening. Small bilateral pleural effusions lying dependently. Atherosclerotic calcifications are noted in the left anterior descending and right coronary arteries. Status post median sternotomy. Hepatobiliary: No suspicious cystic or solid hepatic lesions. No intra or extrahepatic biliary ductal dilatation. Tiny noncalcified gallstones are noted lying dependently in the gallbladder. Gallbladder is moderately distended. No definite pericholecystic fluid or surrounding inflammatory changes. Pancreas: No pancreatic mass. No pancreatic ductal dilatation. No pancreatic or peripancreatic fluid collections or inflammatory changes. Spleen: Unremarkable. Adrenals/Urinary Tract: Tiny subcentimeter low-attenuation lesions in the right kidney, too small to definitively characterize, but statistically likely tiny cysts (no imaging follow-up recommended). Left kidney and bilateral adrenal glands are otherwise normal in appearance. No hydroureteronephrosis. Urinary bladder is unremarkable in appearance. Stomach/Bowel: The appearance of the stomach is normal. No pathologic dilatation of small bowel or colon. Numerous colonic diverticuli are noted. Trace volume of fluid and minimal fat stranding adjacent to diverticuli in the region of the sigmoid colon, which could indicate very mild acute diverticulitis. No definite diverticular abscess or signs of frank perforation are noted at  this time. Normal appendix. Vascular/Lymphatic: Atherosclerosis in the abdominal aorta and pelvic vasculature, without evidence of aneurysm or dissection. No lymphadenopathy noted in the abdomen or pelvis. Reproductive: Prostate gland and seminal vesicles are unremarkable in appearance. Other: Trace volume of  ascites.  No pneumoperitoneum. Musculoskeletal: There are no aggressive appearing lytic or blastic lesions noted in the visualized portions of the skeleton. IMPRESSION: 1. There are imaging findings which could suggest very mild acute diverticulitis in the sigmoid colon. No diverticular abscess or signs of frank perforation are noted at this time. 2. Mass-like area in the right lower lobe concerning for potential neoplasm. Further evaluation with dedicated contrast-enhanced chest CT should be considered in the near future to better evaluate these findings. 3. Small bilateral pleural effusions lying dependently. 4. Cholelithiasis without evidence of acute cholecystitis. 5. Aortic atherosclerosis, in addition to at least 2 vessel coronary artery disease. Aortic Atherosclerosis (ICD10-I70.0). Electronically Signed   By: Trudie Reed M.D.   On: 07/26/2023 10:34   DG Chest 2 View Result Date: 07/26/2023 CLINICAL DATA:  78 year old male with history of generalized weakness and altered mental status. EXAM: CHEST - 2 VIEW COMPARISON:  Chest x-ray 07/02/2023. FINDINGS: Lung volumes are normal. No consolidative airspace disease. No pleural effusions. No pneumothorax. No pulmonary nodule or mass noted. Pulmonary vasculature and the cardiomediastinal silhouette are within normal limits. Atherosclerotic calcifications are noted in the thoracic aorta. Status post median sternotomy for CABG. IMPRESSION: 1.  No radiographic evidence of acute cardiopulmonary disease. 2. Aortic atherosclerosis. Electronically Signed   By: Trudie Reed M.D.   On: 07/26/2023 09:39      Labs: BNP (last 3 results) No results for input(s): "BNP" in the last 8760 hours. Basic Metabolic Panel: Recent Labs  Lab 08/04/23 2002 08/06/23 0545 08/07/23 0514  NA 137 138 139  K 4.4 3.3* 3.6  CL 104 106 109  CO2 25 23 22   GLUCOSE 100* 96 97  BUN 11 12 12   CREATININE 1.27* 1.32* 1.23  CALCIUM 8.9 8.4* 8.7*  MG  --   --  1.7   Liver  Function Tests: Recent Labs  Lab 08/04/23 2001 08/06/23 0545  AST 21 19  ALT 14 17  ALKPHOS 38 33*  BILITOT 0.9 0.6  PROT 5.8* 5.3*  ALBUMIN 3.4* 3.1*   Recent Labs  Lab 08/04/23 2001  LIPASE 31   No results for input(s): "AMMONIA" in the last 168 hours. CBC: Recent Labs  Lab 08/04/23 2002 08/06/23 0545  WBC 4.4 4.4  HGB 10.7* 10.1*  HCT 30.6* 28.5*  MCV 98.4 96.9  PLT 296 266   Cardiac Enzymes: No results for input(s): "CKTOTAL", "CKMB", "CKMBINDEX", "TROPONINI" in the last 168 hours. BNP: Invalid input(s): "POCBNP" CBG: No results for input(s): "GLUCAP" in the last 168 hours. D-Dimer No results for input(s): "DDIMER" in the last 72 hours. Hgb A1c No results for input(s): "HGBA1C" in the last 72 hours. Lipid Profile No results for input(s): "CHOL", "HDL", "LDLCALC", "TRIG", "CHOLHDL", "LDLDIRECT" in the last 72 hours. Thyroid function studies No results for input(s): "TSH", "T4TOTAL", "T3FREE", "THYROIDAB" in the last 72 hours.  Invalid input(s): "FREET3" Anemia work up No results for input(s): "VITAMINB12", "FOLATE", "FERRITIN", "TIBC", "IRON", "RETICCTPCT" in the last 72 hours. Urinalysis    Component Value Date/Time   COLORURINE STRAW (A) 08/05/2023 0013   APPEARANCEUR CLEAR (A) 08/05/2023 0013   APPEARANCEUR Clear 04/15/2018 0906   LABSPEC 1.003 (L) 08/05/2023 0013   LABSPEC 1.009 03/15/2014 1005   PHURINE 6.0 08/05/2023  0013   GLUCOSEU NEGATIVE 08/05/2023 0013   GLUCOSEU Negative 03/15/2014 1005   HGBUR NEGATIVE 08/05/2023 0013   BILIRUBINUR NEGATIVE 08/05/2023 0013   BILIRUBINUR Negative 04/15/2018 0906   BILIRUBINUR Negative 03/15/2014 1005   KETONESUR NEGATIVE 08/05/2023 0013   PROTEINUR NEGATIVE 08/05/2023 0013   UROBILINOGEN 1.0 03/16/2012 1630   NITRITE NEGATIVE 08/05/2023 0013   LEUKOCYTESUR NEGATIVE 08/05/2023 0013   LEUKOCYTESUR Negative 03/15/2014 1005   Sepsis Labs Recent Labs  Lab 08/04/23 2002 08/06/23 0545  WBC 4.4 4.4    Microbiology Recent Results (from the past 240 hours)  Resp panel by RT-PCR (RSV, Flu A&B, Covid) Anterior Nasal Swab     Status: Abnormal   Collection Time: 08/04/23  8:22 PM   Specimen: Anterior Nasal Swab  Result Value Ref Range Status   SARS Coronavirus 2 by RT PCR NEGATIVE NEGATIVE Final    Comment: (NOTE) SARS-CoV-2 target nucleic acids are NOT DETECTED.  The SARS-CoV-2 RNA is generally detectable in upper respiratory specimens during the acute phase of infection. The lowest concentration of SARS-CoV-2 viral copies this assay can detect is 138 copies/mL. A negative result does not preclude SARS-Cov-2 infection and should not be used as the sole basis for treatment or other patient management decisions. A negative result may occur with  improper specimen collection/handling, submission of specimen other than nasopharyngeal swab, presence of viral mutation(s) within the areas targeted by this assay, and inadequate number of viral copies(<138 copies/mL). A negative result must be combined with clinical observations, patient history, and epidemiological information. The expected result is Negative.  Fact Sheet for Patients:  BloggerCourse.com  Fact Sheet for Healthcare Providers:  SeriousBroker.it  This test is no t yet approved or cleared by the [place] FDA and  has been authorized for detection and/or diagnosis of SARS-CoV-2 by FDA under an Emergency Use Authorization (EUA). This EUA will remain  in effect (meaning this test can be used) for the duration of the COVID-19 declaration under Section 564(b)(1) of the Act, 21 U.S.C.section 360bbb-3(b)(1), unless the authorization is terminated  or revoked sooner.       Influenza A by PCR NEGATIVE NEGATIVE Final   Influenza B by PCR NEGATIVE NEGATIVE Final    Comment: (NOTE) The Xpert Xpress SARS-CoV-2/FLU/RSV plus assay is intended as an aid in the diagnosis of  influenza from Nasopharyngeal swab specimens and should not be used as a sole basis for treatment. Nasal washings and aspirates are unacceptable for Xpert Xpress SARS-CoV-2/FLU/RSV testing.  Fact Sheet for Patients: BloggerCourse.com  Fact Sheet for Healthcare Providers: SeriousBroker.it  This test is not yet approved or cleared by the [place] FDA and has been authorized for detection and/or diagnosis of SARS-CoV-2 by FDA under an Emergency Use Authorization (EUA). This EUA will remain in effect (meaning this test can be used) for the duration of the COVID-19 declaration under Section 564(b)(1) of the Act, 21 U.S.C. section 360bbb-3(b)(1), unless the authorization is terminated or revoked.     Resp Syncytial Virus by PCR POSITIVE (A) NEGATIVE Final    Comment: (NOTE) Fact Sheet for Patients: BloggerCourse.com  Fact Sheet for Healthcare Providers: SeriousBroker.it  This test is not yet approved or cleared by the [place] FDA and has been authorized for detection and/or diagnosis of SARS-CoV-2 by FDA under an Emergency Use Authorization (EUA). This EUA will remain in effect (meaning this test can be used) for the duration of the COVID-19 declaration under Section 564(b)(1) of the Act, 21 U.S.C. section 360bbb-3(b)(1), unless  the authorization is terminated or revoked.  Performed at [facility], [address] [place], [place] [address]   Blood culture (single)     Status: None (Preliminary result)   Collection Time: 08/05/23 12:12 AM   Specimen: BLOOD  Result Value Ref Range Status   Specimen Description BLOOD RIGHT FOREARM  Final   Special Requests   Final    BOTTLES DRAWN AEROBIC AND ANAEROBIC Blood Culture adequate volume   Culture   Final    NO GROWTH 2 DAYS Performed at [facility], [address] [place], [place] [address]    Report  Status PENDING  Incomplete     Total time spend on discharging this patient, including the last patient exam, discussing the hospital stay, instructions for ongoing care as it relates to all pertinent caregivers, as well as preparing the medical discharge records, prescriptions, and/or referrals as applicable, is 30 minutes.    Darlin Priestly, MD  [facility] 08/07/2023, 8:40 AM

## 2023-08-07 NOTE — Plan of Care (Signed)

## 2023-08-07 NOTE — Progress Notes (Addendum)
 Approximately 1100--Report called to Selena Batten at [facility]. All questions and concerns answered at this time. Pt to be transported to facility by family.   AVS Discharge Packet provided to pt and family. All questions and concerns answered at this time. All PIVs removed, with sites WDL. All pt belongings taken with pt--pt verified. Pt wheeled down to front of [facility] with [place], [place]. Pt transported to [facility] by son.

## 2023-08-10 ENCOUNTER — Ambulatory Visit: Payer: Medicare Other | Admitting: Physical Therapy

## 2023-08-10 ENCOUNTER — Telehealth: Payer: Self-pay | Admitting: Physical Therapy

## 2023-08-10 DIAGNOSIS — G20A1 Parkinson's disease without dyskinesia, without mention of fluctuations: Secondary | ICD-10-CM | POA: Diagnosis not present

## 2023-08-10 DIAGNOSIS — G20C Parkinsonism, unspecified: Secondary | ICD-10-CM | POA: Diagnosis not present

## 2023-08-10 DIAGNOSIS — R2689 Other abnormalities of gait and mobility: Secondary | ICD-10-CM | POA: Diagnosis not present

## 2023-08-10 DIAGNOSIS — R2681 Unsteadiness on feet: Secondary | ICD-10-CM | POA: Diagnosis not present

## 2023-08-10 DIAGNOSIS — B974 Respiratory syncytial virus as the cause of diseases classified elsewhere: Secondary | ICD-10-CM | POA: Diagnosis not present

## 2023-08-10 DIAGNOSIS — R41841 Cognitive communication deficit: Secondary | ICD-10-CM | POA: Diagnosis not present

## 2023-08-10 DIAGNOSIS — Z9181 History of falling: Secondary | ICD-10-CM | POA: Diagnosis not present

## 2023-08-10 DIAGNOSIS — N1831 Chronic kidney disease, stage 3a: Secondary | ICD-10-CM | POA: Diagnosis not present

## 2023-08-10 DIAGNOSIS — R296 Repeated falls: Secondary | ICD-10-CM | POA: Diagnosis not present

## 2023-08-10 DIAGNOSIS — M6281 Muscle weakness (generalized): Secondary | ICD-10-CM | POA: Diagnosis not present

## 2023-08-10 LAB — CULTURE, BLOOD (SINGLE)
Culture: NO GROWTH
Special Requests: ADEQUATE

## 2023-08-10 NOTE — Telephone Encounter (Signed)
 LVM for wife to call us back. I was calling to let her know we sent off a new order to be signed by PCP and will need this before we can see him and how long we should cancel appoitments until.Marland Kitchen

## 2023-08-11 ENCOUNTER — Ambulatory Visit: Payer: Medicare Other | Admitting: Cardiovascular Disease

## 2023-08-11 DIAGNOSIS — G20A1 Parkinson's disease without dyskinesia, without mention of fluctuations: Secondary | ICD-10-CM | POA: Diagnosis not present

## 2023-08-11 DIAGNOSIS — R2681 Unsteadiness on feet: Secondary | ICD-10-CM | POA: Diagnosis not present

## 2023-08-11 DIAGNOSIS — R41841 Cognitive communication deficit: Secondary | ICD-10-CM | POA: Diagnosis not present

## 2023-08-11 DIAGNOSIS — R296 Repeated falls: Secondary | ICD-10-CM | POA: Diagnosis not present

## 2023-08-11 DIAGNOSIS — Z9181 History of falling: Secondary | ICD-10-CM | POA: Diagnosis not present

## 2023-08-11 DIAGNOSIS — R2689 Other abnormalities of gait and mobility: Secondary | ICD-10-CM | POA: Diagnosis not present

## 2023-08-11 NOTE — Telephone Encounter (Signed)
 noted

## 2023-08-12 DIAGNOSIS — R2681 Unsteadiness on feet: Secondary | ICD-10-CM | POA: Diagnosis not present

## 2023-08-12 DIAGNOSIS — Z9181 History of falling: Secondary | ICD-10-CM | POA: Diagnosis not present

## 2023-08-12 DIAGNOSIS — R2689 Other abnormalities of gait and mobility: Secondary | ICD-10-CM | POA: Diagnosis not present

## 2023-08-12 DIAGNOSIS — G20A1 Parkinson's disease without dyskinesia, without mention of fluctuations: Secondary | ICD-10-CM | POA: Diagnosis not present

## 2023-08-12 DIAGNOSIS — R296 Repeated falls: Secondary | ICD-10-CM | POA: Diagnosis not present

## 2023-08-12 DIAGNOSIS — R41841 Cognitive communication deficit: Secondary | ICD-10-CM | POA: Diagnosis not present

## 2023-08-12 DIAGNOSIS — H53413 Scotoma involving central area, bilateral: Secondary | ICD-10-CM | POA: Diagnosis not present

## 2023-08-13 ENCOUNTER — Ambulatory Visit: Payer: Medicare Other | Admitting: Physical Therapy

## 2023-08-13 DIAGNOSIS — R2681 Unsteadiness on feet: Secondary | ICD-10-CM | POA: Diagnosis not present

## 2023-08-13 DIAGNOSIS — G20A1 Parkinson's disease without dyskinesia, without mention of fluctuations: Secondary | ICD-10-CM | POA: Diagnosis not present

## 2023-08-13 DIAGNOSIS — Z9181 History of falling: Secondary | ICD-10-CM | POA: Diagnosis not present

## 2023-08-13 DIAGNOSIS — R2689 Other abnormalities of gait and mobility: Secondary | ICD-10-CM | POA: Diagnosis not present

## 2023-08-13 DIAGNOSIS — R296 Repeated falls: Secondary | ICD-10-CM | POA: Diagnosis not present

## 2023-08-13 DIAGNOSIS — R41841 Cognitive communication deficit: Secondary | ICD-10-CM | POA: Diagnosis not present

## 2023-08-14 NOTE — Telephone Encounter (Signed)
 Called patients son Tillman Kazmierski [phone] and left a message for a call back.

## 2023-08-17 ENCOUNTER — Ambulatory Visit: Payer: Medicare Other | Attending: Internal Medicine

## 2023-08-17 DIAGNOSIS — R2681 Unsteadiness on feet: Secondary | ICD-10-CM | POA: Diagnosis not present

## 2023-08-17 DIAGNOSIS — R269 Unspecified abnormalities of gait and mobility: Secondary | ICD-10-CM | POA: Insufficient documentation

## 2023-08-17 DIAGNOSIS — R2689 Other abnormalities of gait and mobility: Secondary | ICD-10-CM | POA: Diagnosis not present

## 2023-08-17 DIAGNOSIS — M6281 Muscle weakness (generalized): Secondary | ICD-10-CM | POA: Insufficient documentation

## 2023-08-17 DIAGNOSIS — R262 Difficulty in walking, not elsewhere classified: Secondary | ICD-10-CM | POA: Insufficient documentation

## 2023-08-17 NOTE — Therapy (Signed)
 OUTPATIENT PHYSICAL THERAPY NEURO EVALUATION   Patient Name: Johnathan Arnold. MRN: [id number] DOB:01/16/1946, 78 y.o., male Today's Date: 08/17/2023  PCP: [name], MD  REFERRING PROVIDER: [name], MD   END OF SESSION:  PT End of Session - 08/17/23 1112     Visit Number 1    Number of Visits 24    Date for PT Re-Evaluation 11/09/23    Authorization Type Medicare, BCBS supplement    PT Start Time 1104    PT Stop Time 1145    PT Time Calculation (min) 41 min    Equipment Utilized During Treatment Gait belt    Activity Tolerance Patient tolerated treatment well    Behavior During Therapy WFL for tasks assessed/performed             Past Medical History:  Diagnosis Date   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 LIMA to LAD, SVG to D1, OM1 and RPDA   CORONARY ARTERY BYPASS GRAFT  03/1998   5 vessel, [facility]   RIGHT HEART CATH N/A 04/04/2019   Procedure: RIGHT HEART CATH;  Surgeon: Iran Ouch, MD;  Location: [facility] INVASIVE CV LAB;  Service: Cardiovascular;  Laterality: N/A;   RIGHT/LEFT HEART CATH AND CORONARY ANGIOGRAPHY N/A 02/01/2018   Procedure: RIGHT/LEFT HEART CATH AND CORONARY ANGIOGRAPHY;  Surgeon: Iran Ouch, MD;  Location: [facility] INVASIVE CV LAB;  Service: Cardiovascular;  Laterality: N/A;   Patient Active Problem List   Diagnosis Date Noted   Diverticulitis 07/26/2023   RSV (respiratory syncytial virus pneumonia) 07/26/2023   UTI (urinary tract infection) 07/02/2023   Impaired ambulation 07/02/2023   Orthostatic hypotension 05/22/2023   Generalized  weakness 05/07/2021   Bradycardia    Anemia, unspecified 02/07/2021   Neutropenia (HCC) 01/29/2021   Thrombocytopenia (HCC) 01/29/2021   Right lower lobe pneumonia 12/28/2020   Hyponatremia 12/22/2020   Bilateral leg weakness 12/20/2020   Prostate cancer screening 09/08/2020   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) 09/06/2020   Low back pain 08/19/2019   Pulmonary hypertension (HCC)    Insomnia 12/21/2018   Sleep apnea in adult 12/09/2018   Periodic limb movement disorder 12/09/2018   Pulmonary nodules 05/18/2018   Wears hearing aid in both ears 05/17/2018   Leg pain, bilateral 02/20/2018   Dyspnea    CKD stage 3a, GFR 45-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/min (HCC) 09/21/2017   History of skin cancer in adulthood 08/14/2016   Parkinson's disease (HCC) 08/09/2016   Bilateral carotid artery stenosis 04/02/2015   Vertigo, peripheral 10/17/2014   Benign prostatic hyperplasia with urinary frequency 01/31/2014   Obesity 04/03/2013   Other malaise and fatigue 09/21/2012   Hyperlipidemia    Essential hypertension    3-vessel coronary artery disease    S/P CABG x 5     ONSET DATE: 07/03/23  REFERRING DIAG: I95.188 (ICD-10-CM) - Weakness of both lower extremities   THERAPY DIAG:  Unsteadiness on feet  Difficulty in walking, not elsewhere classified  Other abnormalities of gait and mobility  Abnormality of gait and  mobility  Muscle weakness (generalized)  Rationale for Evaluation and Treatment: Rehabilitation  SUBJECTIVE:                                                                                                                                                                                             SUBJECTIVE STATEMENT:  Pt was recently hospitalized on 3 different occasions and went to a rehab facility following those stays.  Pt reports he is no longer in the shape that he was in prior to his hospitalization.  He has been seen in this clinic for a significant amount of  time.   Pt accompanied by: significant other  PERTINENT HISTORY:  From prior PT evaluation: PD, history of COVID and significant weakness present following this diagnosis and the use of paxlovid ( August 5 dx date, thinks Paxlovid may have exacerbated PD symptoms)    Pt reports having COVID and having significant weakness.  Patient reports he also has back pain that is not present for a long time but was exacerbated more recently.  Patient previously did physical therapy for his back and experienced some relief but has not been consistent with the exercises.  Patient also has history of going to Parkinson's rock steady classes multiple times per week prior to onset of his COVID but he has not been back since. Patient previously ambulated without an assistive device but is now ambulating with a straight point cane.  Patient reports increased foot and ankle weakness in comparison with his hips and knees.  Patient also reports low back pain that is exacerbated with prolonged standing.  PAIN:  Are you having pain? No  PRECAUTIONS: Fall  RED FLAGS: None   WEIGHT BEARING RESTRICTIONS: No  FALLS: Has patient fallen in last 6 months? Yes. Number of falls 6  LIVING ENVIRONMENT: Lives with: lives with their spouse Lives in: House/apartment Stairs: Yes: Internal: 15 steps; on left going up and External: 1 steps; none Has following equipment at home: Quad cane large base and Walker - 2 wheeled  PLOF: Independent  PATIENT GOALS: to improve overall strength, get back to walking more regularly  OBJECTIVE:  Note: Objective measures were completed at Evaluation unless otherwise noted.  DIAGNOSTIC FINDINGS:   EXAM: CT HEAD WITHOUT CONTRAST  IMPRESSION: 1. No acute intracranial process. 2. Mild chronic small vessel ischemic changes. 3. Acute right frontal and maxillary sinusitis.  COGNITION: Overall cognitive status: Within functional limits for tasks  assessed   SENSATION: WFL  COORDINATION: WFL    LOWER EXTREMITY ROM:     Active  Right Eval Left Eval  Hip flexion  Hip extension    Hip abduction    Hip adduction    Hip internal rotation    Hip external rotation    Knee flexion    Knee extension    Ankle dorsiflexion    Ankle plantarflexion    Ankle inversion    Ankle eversion     (Blank rows = not tested)  LOWER EXTREMITY MMT:    MMT Right Eval Left Eval  Hip flexion 4 4  Hip abduction 4 4  Hip adduction 4 4  Knee flexion 3+ 3+  Knee extension 4- 4-  Ankle dorsiflexion 4+ 4  (Blank rows = not tested)  BED MOBILITY:  No limitations according to the pt.  TRANSFERS: Assistive device utilized: None  Sit to stand: Complete Independence Stand to sit: Complete Independence Chair to chair: Complete Independence Floor:  Not tested  FUNCTIONAL TESTS:  5 times sit to stand: 15.03 sec Timed up and go (TUG): 13.38 sec 6 minute walk test: TBD 10 meter walk test: 12.15 sec; 0.82 m/s Dynamic Gait Index: TBD  PATIENT SURVEYS:  ABC scale 70.6%                                                                                                                              TREATMENT DATE: 08/17/23  Self-Care:  Pt and spouse educated on role of therapy and benefits from skilled therapeutic interventions.  Pt was invited to ask questions along with spouse and therapist answered any questions that were asked.  Pt and spouse informed of current weakness and other differential diagnoses that are contributing to his balance and weakness problem.  Pt and spouse informed on rehab potential with therapy, although pt was already aware of it due to being seen in prior sessions.        PATIENT EDUCATION: Education details: Pt educated throughout session about proper posture and technique with exercises. Improved exercise technique, movement at target joints, use of target muscles after min to mod verbal, visual, tactile  cues  Person educated: Patient and Spouse Education method: Explanation, Demonstration, and Verbal cues Education comprehension: verbalized understanding  HOME EXERCISE PROGRAM: Deferred to next session  GOALS: Goals reviewed with patient? Yes  SHORT TERM GOALS: Target date: 09/14/2023    Pt will be independent with HEP in order to demonstrate increased ability to perform tasks related to occupation/hobbies. Baseline:  Pt to be given HEP at next visit. Goal status: INITIAL  LONG TERM GOALS: Target date: 11/09/2023  1.  Patient (> 67 years old) will complete five times sit to stand test in < 15 seconds indicating an increased LE strength and improved balance. Baseline: 15.03 sec Goal status: INITIAL  2.  Patient will increase their ABC scale score to be 80.6% (an increase of 10 points), demonstrating improved balance confidence and reduced fall risk, as measured through weekly assessments. Baseline: 70.6% Goal status: INITIAL   3.  Patient will increase Berg Balance score by >  6 points to demonstrate decreased fall risk during functional activities. Baseline: TBD Goal status: INITIAL   4.  Patient will reduce timed up and go to <11 seconds to reduce fall risk and demonstrate improved transfer/gait ability. Baseline: 13.38 sec Goal status: INITIAL  5.  Patient will increase 10 meter walk test to >1.94m/s as to improve gait speed for better community ambulation and to reduce fall risk. Baseline: 12.15 sec; 0.82 m/s Goal status: INITIAL  6.  Patient will increase six minute walk test distance to >1000 for progression to community ambulator and improve gait ability Baseline: TBD Goal status: INITIAL   ASSESSMENT:  CLINICAL IMPRESSION: Patient is a 78 y.o. male who was seen today for physical therapy evaluation and treatment for B LE weakness.  Pt presents with physical impairments of decreased activity tolerance, reduced balance, and decreased strength in B LE's as noted.  Pt  will benefit from skilled therapy to address tolerance, balance, and strength impairments necessary for improvement in quality of life.  Pt. demonstrates understanding of this plan of care and agrees with this plan.    OBJECTIVE IMPAIRMENTS: Abnormal gait, decreased activity tolerance, decreased balance, decreased endurance, decreased knowledge of use of DME, decreased mobility, difficulty walking, decreased ROM, decreased strength, and decreased safety awareness.   ACTIVITY LIMITATIONS: carrying, lifting, bending, standing, squatting, bathing, and locomotion level  PARTICIPATION LIMITATIONS: cleaning, laundry, driving, shopping, community activity, and yard work  PERSONAL FACTORS: Age, Education, Past/current experiences, Time since onset of injury/illness/exacerbation, and 3+ comorbidities: orthostatic HTN, syncope, falls, anemia, insomnia, dyspnea  are also affecting patient's functional outcome.   REHAB POTENTIAL: Fair pt has been seen in the past.  CLINICAL DECISION MAKING: Evolving/moderate complexity  EVALUATION COMPLEXITY: Moderate  PLAN:  PT FREQUENCY: 2x/week  PT DURATION: 12 weeks  PLANNED INTERVENTIONS: 97110-Therapeutic exercises, 97530- Therapeutic activity, 97112- Neuromuscular re-education, 97535- Self Care, 28413- Manual therapy, [phone]- Gait training, Balance training, Stair training, and Vestibular training  PLAN FOR NEXT SESSION: , BERG Balance Test   Nolon Bussing, PT, DPT Physical Therapist - [facility]  08/17/23, 1:17 PM

## 2023-08-20 ENCOUNTER — Ambulatory Visit: Payer: Medicare Other | Admitting: Physical Therapy

## 2023-08-20 DIAGNOSIS — R269 Unspecified abnormalities of gait and mobility: Secondary | ICD-10-CM

## 2023-08-20 DIAGNOSIS — R2681 Unsteadiness on feet: Secondary | ICD-10-CM

## 2023-08-20 DIAGNOSIS — M6281 Muscle weakness (generalized): Secondary | ICD-10-CM | POA: Diagnosis not present

## 2023-08-20 DIAGNOSIS — R262 Difficulty in walking, not elsewhere classified: Secondary | ICD-10-CM | POA: Diagnosis not present

## 2023-08-20 DIAGNOSIS — R2689 Other abnormalities of gait and mobility: Secondary | ICD-10-CM

## 2023-08-20 NOTE — Therapy (Signed)
 OUTPATIENT PHYSICAL THERAPY NEURO TREATMENT   Patient Name: Johnathan Arnold. MRN: [id number] DOB:January 26, 1946, 78 y.o., male Today's Date: 08/20/2023  PCP: [name], MD  REFERRING PROVIDER: [name], MD   END OF SESSION:  PT End of Session - 08/20/23 1058     Visit Number 2    Number of Visits 24    Date for PT Re-Evaluation 11/09/23    Authorization Type Medicare, BCBS supplement    Progress Note Due on Visit 10    PT Start Time 1102    PT Stop Time 1140    PT Time Calculation (min) 38 min    Equipment Utilized During Treatment Gait belt    Activity Tolerance Patient tolerated treatment well    Behavior During Therapy WFL for tasks assessed/performed              Past Medical History:  Diagnosis Date   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 LIMA to LAD, SVG to D1, OM1 and RPDA   CORONARY ARTERY BYPASS GRAFT  03/1998   5 vessel, [facility]   RIGHT HEART CATH N/A 04/04/2019   Procedure: RIGHT HEART CATH;  Surgeon: Iran Ouch, MD;  Location: [facility] INVASIVE CV LAB;  Service: Cardiovascular;  Laterality: N/A;   RIGHT/LEFT HEART CATH AND CORONARY ANGIOGRAPHY N/A 02/01/2018   Procedure: RIGHT/LEFT HEART CATH AND CORONARY ANGIOGRAPHY;  Surgeon: Iran Ouch, MD;  Location: [facility] INVASIVE CV LAB;  Service: Cardiovascular;  Laterality: N/A;   Patient Active Problem List   Diagnosis Date Noted   Diverticulitis 07/26/2023   RSV (respiratory syncytial virus pneumonia) 07/26/2023   UTI (urinary tract infection) 07/02/2023   Impaired ambulation 07/02/2023   Orthostatic  hypotension 05/22/2023   Generalized weakness 05/07/2021   Bradycardia    Anemia, unspecified 02/07/2021   Neutropenia (HCC) 01/29/2021   Thrombocytopenia (HCC) 01/29/2021   Right lower lobe pneumonia 12/28/2020   Hyponatremia 12/22/2020   Bilateral leg weakness 12/20/2020   Prostate cancer screening 09/08/2020   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) 09/06/2020   Low back pain 08/19/2019   Pulmonary hypertension (HCC)    Insomnia 12/21/2018   Sleep apnea in adult 12/09/2018   Periodic limb movement disorder 12/09/2018   Pulmonary nodules 05/18/2018   Wears hearing aid in both ears 05/17/2018   Leg pain, bilateral 02/20/2018   Dyspnea    CKD stage 3a, GFR 45-59 ml/min (HCC) 09/21/2017   History of skin cancer in adulthood 08/14/2016   Parkinson's disease (HCC) 08/09/2016   Bilateral carotid artery stenosis 04/02/2015   Vertigo, peripheral 10/17/2014   Benign prostatic hyperplasia with urinary frequency 01/31/2014   Obesity 04/03/2013   Other malaise and fatigue 09/21/2012   Hyperlipidemia    Essential hypertension    3-vessel coronary artery disease    S/P CABG x 5     ONSET DATE: 07/03/23  REFERRING DIAG: W09.811 (ICD-10-CM) - Weakness of both lower extremities   THERAPY DIAG:  No diagnosis found.  Rationale for Evaluation and Treatment: Rehabilitation  SUBJECTIVE:  SUBJECTIVE STATEMENT:  Patient reports he is tired today with no significant changes since last session  Pt accompanied by: significant other  PERTINENT HISTORY:  From prior PT evaluation: PD, history of COVID and significant weakness present following this diagnosis and the use of paxlovid ( August 5 dx date, thinks Paxlovid may have exacerbated PD symptoms)    Pt reports having COVID and  having significant weakness.  Patient reports he also has back pain that is not present for a long time but was exacerbated more recently.  Patient previously did physical therapy for his back and experienced some relief but has not been consistent with the exercises.  Patient also has history of going to Parkinson's rock steady classes multiple times per week prior to onset of his COVID but he has not been back since. Patient previously ambulated without an assistive device but is now ambulating with a straight point cane.  Patient reports increased foot and ankle weakness in comparison with his hips and knees.  Patient also reports low back pain that is exacerbated with prolonged standing.  PAIN:  Are you having pain? No  PRECAUTIONS: Fall  RED FLAGS: None   WEIGHT BEARING RESTRICTIONS: No  FALLS: Has patient fallen in last 6 months? Yes. Number of falls 6  LIVING ENVIRONMENT: Lives with: lives with their spouse Lives in: House/apartment Stairs: Yes: Internal: 15 steps; on left going up and External: 1 steps; none Has following equipment at home: Quad cane large base and Walker - 2 wheeled  PLOF: Independent  PATIENT GOALS: to improve overall strength, get back to walking more regularly  OBJECTIVE:  Note: Objective measures were completed at Evaluation unless otherwise noted.  DIAGNOSTIC FINDINGS:   EXAM: CT HEAD WITHOUT CONTRAST  IMPRESSION: 1. No acute intracranial process. 2. Mild chronic small vessel ischemic changes. 3. Acute right frontal and maxillary sinusitis.  COGNITION: Overall cognitive status: Within functional limits for tasks assessed   SENSATION: WFL  COORDINATION: WFL    LOWER EXTREMITY ROM:     Active  Right Eval Left Eval  Hip flexion    Hip extension    Hip abduction    Hip adduction    Hip internal rotation    Hip external rotation    Knee flexion    Knee extension    Ankle dorsiflexion    Ankle plantarflexion    Ankle inversion     Ankle eversion     (Blank rows = not tested)  LOWER EXTREMITY MMT:    MMT Right Eval Left Eval  Hip flexion 4 4  Hip abduction 4 4  Hip adduction 4 4  Knee flexion 3+ 3+  Knee extension 4- 4-  Ankle dorsiflexion 4+ 4  (Blank rows = not tested)  BED MOBILITY:  No limitations according to the pt.  TRANSFERS: Assistive device utilized: None  Sit to stand: Complete Independence Stand to sit: Complete Independence Chair to chair: Complete Independence Floor:  Not tested  FUNCTIONAL TESTS:  5 times sit to stand: 15.03 sec Timed up and go (TUG): 13.38 sec 6 minute walk test: TBD 10 meter walk test: 12.15 sec; 0.82 m/s Dynamic Gait Index: TBD  PATIENT SURVEYS:  ABC scale 70.6%  TREATMENT DATE: 08/20/23 Physical Performance   Patient demonstrates increased fall risk as noted by score of  43 /56 on Berg Balance Scale.  (<36= high risk for falls, close to 100%; 37-45 significant >80%; 46-51 moderate >50%; 52-55 lower >25%)    [facility] PT Assessment - 08/20/23 0001       Standardized Balance Assessment   Standardized Balance Assessment Berg Balance Test      Berg Balance Test   Sit to Stand Able to stand  independently using hands    Standing Unsupported Able to stand 2 minutes with supervision    Sitting with Back Unsupported but Feet Supported on Floor or Stool Able to sit safely and securely 2 minutes    Stand to Sit Controls descent by using hands    Transfers Able to transfer safely, definite need of hands    Standing Unsupported with Eyes Closed Able to stand 10 seconds safely    Standing Unsupported with Feet Together Able to place feet together independently and stand for 1 minute with supervision    From Standing, Reach Forward with Outstretched Arm Can reach forward >12 cm safely (5")    From Standing Position, Pick up Object from Floor Able  to pick up shoe, needs supervision    From Standing Position, Turn to Look Behind Over each Shoulder Looks behind one side only/other side shows less weight shift    Turn 360 Degrees Able to turn 360 degrees safely but slowly    Standing Unsupported, Alternately Place Feet on Step/Stool Able to stand independently and complete 8 steps >20 seconds    Standing Unsupported, One Foot in Front Able to plae foot ahead of the other independently and hold 30 seconds    Standing on One Leg Able to lift leg independently and hold 5-10 seconds    Total Score 43             6 Min Walk Test:  Instructed patient to ambulate as quickly and as safely as possible for 6 minutes using LRAD. Patient was allowed to take standing rest breaks without stopping the test, but if the patient required a sitting rest break the clock would be stopped and the test would be over.  Results: 459 feet using a RW with SBA and stopping after 5:30 sec. Results indicate that the patient has reduced endurance with ambulation compared to age matched norms.  Age Matched Norms: 9-69 yo M: 74 F: 38, 8-79 yo M: 29 F: 471, 72-89 yo M: 417 F: 392 MDC: 58.21 meters (190.98 feet) or 50 meters (ANPTA Core Set of Outcome Measures for Adults with Neurologic Conditions, 2018)   NMR:   Seated PWR! Up, rock twist, step x 10 ea   PWR! Up targets postural strengthening and antigravity extension, PRW! Rock targets functional weight shifting, PRW! Twist targets trunk rotation and PRW! Step targets transition movements. PWR! Moves target bradykinesia, rigidity, and dyskinesia through targeted functional movements that address four core movement difficulties for people with Parkinson's disease.     PATIENT EDUCATION: Education details: Pt educated throughout session about proper posture and technique with exercises. Improved exercise technique, movement at target joints, use of target muscles after min to mod verbal, visual, tactile  cues  Person educated: Patient and Spouse Education method: Explanation, Demonstration, and Verbal cues Education comprehension: verbalized understanding  HOME EXERCISE PROGRAM: Deferred to next session  GOALS: Goals reviewed with patient? Yes  SHORT TERM GOALS: Target date: 09/14/2023    Pt will be  independent with HEP in order to demonstrate increased ability to perform tasks related to occupation/hobbies. Baseline:  Pt to be given HEP at next visit. Goal status: INITIAL  LONG TERM GOALS: Target date: 11/09/2023  1.  Patient (> 60 years old) will complete five times sit to stand test in < 15 seconds indicating an increased LE strength and improved balance. Baseline: 15.03 sec Goal status: INITIAL  2.  Patient will increase their ABC scale score to be 80.6% (an increase of 10 points), demonstrating improved balance confidence and reduced fall risk, as measured through weekly assessments. Baseline: 70.6% Goal status: INITIAL   3.  Patient will increase Berg Balance score by > 6 points to demonstrate decreased fall risk during functional activities. Baseline: TBD Goal status: INITIAL   4.  Patient will reduce timed up and go to <11 seconds to reduce fall risk and demonstrate improved transfer/gait ability. Baseline: 13.38 sec Goal status: INITIAL  5.  Patient will increase 10 meter walk test to >1.48m/s as to improve gait speed for better community ambulation and to reduce fall risk. Baseline: 12.15 sec; 0.82 m/s Goal status: INITIAL  6.  Patient will increase six minute walk test distance to >1000 for progression to community ambulator and improve gait ability Baseline: 459 ft with RW stopping at 5:30 due to fatigue  Goal status: INITIAL   ASSESSMENT:  CLINICAL IMPRESSION: Patient presents with good motivation for completion of physical therapy activities.  Patient assessed with 6-minute walk test and demonstrates significant limitations with community mobility based on  his test results.  Physical therapist did adjust walker for patient which may improve his upper extremity endurance with ambulation with his walker.  Patient also demonstrates significant balance risk with his Berg balance test this demonstrated an flow seat section of this exam.  Both knees are placing patient at high risk of falls.  Patient began with seated Parkinson's wellness recovery moves. PWR! Up targets postural strengthening and antigravity extension, PRW! Rock targets functional weight shifting, PRW! Twist targets trunk rotation and PRW! Step targets transition movements. PWR! Moves target bradykinesia, rigidity, and dyskinesia through targeted functional movements that address four core movement difficulties for people with Parkinson's disease. Pt will continue to benefit from skilled physical therapy intervention to address impairments, improve QOL, and attain therapy goals.     OBJECTIVE IMPAIRMENTS: Abnormal gait, decreased activity tolerance, decreased balance, decreased endurance, decreased knowledge of use of DME, decreased mobility, difficulty walking, decreased ROM, decreased strength, and decreased safety awareness.   ACTIVITY LIMITATIONS: carrying, lifting, bending, standing, squatting, bathing, and locomotion level  PARTICIPATION LIMITATIONS: cleaning, laundry, driving, shopping, community activity, and yard work  PERSONAL FACTORS: Age, Education, Past/current experiences, Time since onset of injury/illness/exacerbation, and 3+ comorbidities: orthostatic HTN, syncope, falls, anemia, insomnia, dyspnea  are also affecting patient's functional outcome.   REHAB POTENTIAL: Fair pt has been seen in the past.  CLINICAL DECISION MAKING: Evolving/moderate complexity  EVALUATION COMPLEXITY: Moderate  PLAN:  PT FREQUENCY: 2x/week  PT DURATION: 12 weeks  PLANNED INTERVENTIONS: 97110-Therapeutic exercises, 97530- Therapeutic activity, 97112- Neuromuscular re-education, 97535- Self  Care, 16109- Manual therapy, [phone]- Gait training, Balance training, Stair training, and Vestibular training  PLAN FOR NEXT SESSION: , BERG Balance Test   Norman Herrlich PT ,DPT Physical Therapist- [facility]  [facility]  08/20/23, 10:58 AM

## 2023-08-21 ENCOUNTER — Encounter: Payer: Self-pay | Admitting: Cardiovascular Disease

## 2023-08-21 ENCOUNTER — Encounter: Payer: Self-pay | Admitting: Neurology

## 2023-08-24 ENCOUNTER — Ambulatory Visit: Payer: Medicare Other | Admitting: Physical Therapy

## 2023-08-24 DIAGNOSIS — R2681 Unsteadiness on feet: Secondary | ICD-10-CM | POA: Diagnosis not present

## 2023-08-24 DIAGNOSIS — R262 Difficulty in walking, not elsewhere classified: Secondary | ICD-10-CM

## 2023-08-24 DIAGNOSIS — M6281 Muscle weakness (generalized): Secondary | ICD-10-CM | POA: Diagnosis not present

## 2023-08-24 DIAGNOSIS — R2689 Other abnormalities of gait and mobility: Secondary | ICD-10-CM

## 2023-08-24 DIAGNOSIS — R269 Unspecified abnormalities of gait and mobility: Secondary | ICD-10-CM | POA: Diagnosis not present

## 2023-08-24 NOTE — Therapy (Signed)
 OUTPATIENT PHYSICAL THERAPY NEURO TREATMENT   Patient Name: Johnathan Arnold. MRN: [id number] DOB:25-Mar-1946, 78 y.o., male Today's Date: 08/24/2023  PCP: [name], MD  REFERRING PROVIDER: [name], MD   END OF SESSION:  PT End of Session - 08/24/23 1409     Visit Number 3    Number of Visits 24    Date for PT Re-Evaluation 11/09/23    Authorization Type Medicare, BCBS supplement    Progress Note Due on Visit 10    PT Start Time 1404    PT Stop Time 1442    PT Time Calculation (min) 38 min    Equipment Utilized During Treatment Gait belt    Activity Tolerance Patient tolerated treatment well    Behavior During Therapy WFL for tasks assessed/performed               Past Medical History:  Diagnosis Date   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 LIMA to LAD, SVG to D1, OM1 and RPDA   CORONARY ARTERY BYPASS GRAFT  03/1998   5 vessel, [facility]   RIGHT HEART CATH N/A 04/04/2019   Procedure: RIGHT HEART CATH;  Surgeon: Iran Ouch, MD;  Location: [facility] INVASIVE CV LAB;  Service: Cardiovascular;  Laterality: N/A;   RIGHT/LEFT HEART CATH AND CORONARY ANGIOGRAPHY N/A 02/01/2018   Procedure: RIGHT/LEFT HEART CATH AND CORONARY ANGIOGRAPHY;  Surgeon: Iran Ouch, MD;  Location: [facility] INVASIVE CV LAB;  Service: Cardiovascular;  Laterality: N/A;   Patient Active Problem List   Diagnosis Date Noted   Diverticulitis 07/26/2023   RSV (respiratory syncytial virus pneumonia) 07/26/2023   UTI (urinary tract infection) 07/02/2023   Impaired ambulation 07/02/2023   Orthostatic  hypotension 05/22/2023   Generalized weakness 05/07/2021   Bradycardia    Anemia, unspecified 02/07/2021   Neutropenia (HCC) 01/29/2021   Thrombocytopenia (HCC) 01/29/2021   Right lower lobe pneumonia 12/28/2020   Hyponatremia 12/22/2020   Bilateral leg weakness 12/20/2020   Prostate cancer screening 09/08/2020   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) 09/06/2020   Low back pain 08/19/2019   Pulmonary hypertension (HCC)    Insomnia 12/21/2018   Sleep apnea in adult 12/09/2018   Periodic limb movement disorder 12/09/2018   Pulmonary nodules 05/18/2018   Wears hearing aid in both ears 05/17/2018   Leg pain, bilateral 02/20/2018   Dyspnea    CKD stage 3a, GFR 45-59 ml/min (HCC) 09/21/2017   History of skin cancer in adulthood 08/14/2016   Parkinson's disease (HCC) 08/09/2016   Bilateral carotid artery stenosis 04/02/2015   Vertigo, peripheral 10/17/2014   Benign prostatic hyperplasia with urinary frequency 01/31/2014   Obesity 04/03/2013   Other malaise and fatigue 09/21/2012   Hyperlipidemia    Essential hypertension    3-vessel coronary artery disease    S/P CABG x 5     ONSET DATE: 07/03/23  REFERRING DIAG: Y40.347 (ICD-10-CM) - Weakness of both lower extremities   THERAPY DIAG:  Unsteadiness on feet  Difficulty in walking, not elsewhere classified  Other abnormalities of gait and mobility  Abnormality of gait and mobility  Muscle weakness (generalized)  Rationale for Evaluation and Treatment: Rehabilitation  SUBJECTIVE:                                                                                                                                                                                             SUBJECTIVE STATEMENT:  Patient reports he is feeling better today.   Pt accompanied by: significant other  PERTINENT HISTORY:  From prior PT evaluation: PD, history of COVID and significant weakness present following this diagnosis  and the use of paxlovid ( August 5 dx date, thinks Paxlovid may have exacerbated PD symptoms)    Pt reports having COVID and having significant weakness.  Patient reports he also has back pain that is not present for a long time but was exacerbated more recently.  Patient previously did physical therapy for his back and experienced some relief but has not been consistent with the exercises.  Patient also has history of going to Parkinson's rock steady classes multiple times per week prior to onset of his COVID but he has not been back since. Patient previously ambulated without an assistive device but is now ambulating with a straight point cane.  Patient reports increased foot and ankle weakness in comparison with his hips and knees.  Patient also reports low back pain that is exacerbated with prolonged standing.  PAIN:  Are you having pain? No  PRECAUTIONS: Fall  RED FLAGS: None   WEIGHT BEARING RESTRICTIONS: No  FALLS: Has patient fallen in last 6 months? Yes. Number of falls 6  LIVING ENVIRONMENT: Lives with: lives with their spouse Lives in: House/apartment Stairs: Yes: Internal: 15 steps; on left going up and External: 1 steps; none Has following equipment at home: Quad cane large base and Walker - 2 wheeled  PLOF: Independent  PATIENT GOALS: to improve overall strength, get back to walking more regularly  OBJECTIVE:  Note: Objective measures were completed at Evaluation unless otherwise noted.  DIAGNOSTIC FINDINGS:   EXAM: CT HEAD WITHOUT CONTRAST  IMPRESSION: 1. No acute intracranial process. 2. Mild chronic small vessel ischemic changes. 3. Acute right frontal and maxillary sinusitis.  COGNITION: Overall cognitive status: Within functional limits for tasks assessed   SENSATION: WFL  COORDINATION: WFL    LOWER EXTREMITY ROM:     Active  Right Eval Left Eval  Hip flexion    Hip extension    Hip abduction    Hip adduction    Hip internal rotation     Hip external rotation    Knee flexion  Knee extension    Ankle dorsiflexion    Ankle plantarflexion    Ankle inversion    Ankle eversion     (Blank rows = not tested)  LOWER EXTREMITY MMT:    MMT Right Eval Left Eval  Hip flexion 4 4  Hip abduction 4 4  Hip adduction 4 4  Knee flexion 3+ 3+  Knee extension 4- 4-  Ankle dorsiflexion 4+ 4  (Blank rows = not tested)  BED MOBILITY:  No limitations according to the pt.  TRANSFERS: Assistive device utilized: None  Sit to stand: Complete Independence Stand to sit: Complete Independence Chair to chair: Complete Independence Floor:  Not tested  FUNCTIONAL TESTS:  5 times sit to stand: 15.03 sec Timed up and go (TUG): 13.38 sec 6 minute walk test: TBD 10 meter walk test: 12.15 sec; 0.82 m/s Dynamic Gait Index: TBD  PATIENT SURVEYS:  ABC scale 70.6%                                                                                                                              TREATMENT DATE: 08/24/23 TE- To improve strength, endurance, mobility, and function of specific targeted muscle groups or improve joint range of motion or improve muscle flexibility Seated LAQ with 4# AW 2 x 10 Seated march with 4# AW 2 x 19  Seated step over 2 x 10 with 4# AW ( hip add/ abd over obstacle)  Seated heel raise 2 x 10 with 3 sec hodl and slow eccentric portion   TA- To improve functional movements patterns for everyday tasks  Resisted gait with 4# AW, cues for focus on foot clearance   NMR:  Octane level 2 x 7 min for B UE and LE reciprocal movement training.   Seated PWR! Up with postural boost with RTB x 10 reps  PWR! Up targets postural strengthening and antigravity extension,      PATIENT EDUCATION: Education details: Pt educated throughout session about proper posture and technique with exercises. Improved exercise technique, movement at target joints, use of target muscles after min to mod verbal, visual, tactile  cues  Person educated: Patient and Spouse Education method: Explanation, Demonstration, and Verbal cues Education comprehension: verbalized understanding  HOME EXERCISE PROGRAM: Deferred to next session  GOALS: Goals reviewed with patient? Yes  SHORT TERM GOALS: Target date: 09/14/2023    Pt will be independent with HEP in order to demonstrate increased ability to perform tasks related to occupation/hobbies. Baseline:  Pt to be given HEP at next visit. Goal status: INITIAL  LONG TERM GOALS: Target date: 11/09/2023  1.  Patient (> 73 years old) will complete five times sit to stand test in < 15 seconds indicating an increased LE strength and improved balance. Baseline: 15.03 sec Goal status: INITIAL  2.  Patient will increase their ABC scale score to be 80.6% (an increase of 10 points), demonstrating improved balance confidence and reduced fall risk, as measured  through weekly assessments. Baseline: 70.6% Goal status: INITIAL   3.  Patient will increase Berg Balance score by > 6 points to demonstrate decreased fall risk during functional activities. Baseline: TBD Goal status: INITIAL   4.  Patient will reduce timed up and go to <11 seconds to reduce fall risk and demonstrate improved transfer/gait ability. Baseline: 13.38 sec Goal status: INITIAL  5.  Patient will increase 10 meter walk test to >1.40m/s as to improve gait speed for better community ambulation and to reduce fall risk. Baseline: 12.15 sec; 0.82 m/s Goal status: INITIAL  6.  Patient will increase six minute walk test distance to >1000 for progression to community ambulator and improve gait ability Baseline: 459 ft with RW stopping at 5:30 due to fatigue  Goal status: INITIAL   ASSESSMENT:  CLINICAL IMPRESSION:  Patient arrived with good motivation for completion of pt activities.  Pt had more energy this date but still had fatigue primarily following walking activities with resistance. Pt did well with  seated strengthening activities and will consider progression to standing activities next date. Pt will continue to benefit from skilled physical therapy intervention to address impairments, improve QOL, and attain therapy goals.      OBJECTIVE IMPAIRMENTS: Abnormal gait, decreased activity tolerance, decreased balance, decreased endurance, decreased knowledge of use of DME, decreased mobility, difficulty walking, decreased ROM, decreased strength, and decreased safety awareness.   ACTIVITY LIMITATIONS: carrying, lifting, bending, standing, squatting, bathing, and locomotion level  PARTICIPATION LIMITATIONS: cleaning, laundry, driving, shopping, community activity, and yard work  PERSONAL FACTORS: Age, Education, Past/current experiences, Time since onset of injury/illness/exacerbation, and 3+ comorbidities: orthostatic HTN, syncope, falls, anemia, insomnia, dyspnea  are also affecting patient's functional outcome.   REHAB POTENTIAL: Fair pt has been seen in the past.  CLINICAL DECISION MAKING: Evolving/moderate complexity  EVALUATION COMPLEXITY: Moderate  PLAN:  PT FREQUENCY: 2x/week  PT DURATION: 12 weeks  PLANNED INTERVENTIONS: 97110-Therapeutic exercises, 97530- Therapeutic activity, 97112- Neuromuscular re-education, 97535- Self Care, [id number]- Manual therapy, [phone]- Gait training, Balance training, Stair training, and Vestibular training  PLAN FOR NEXT SESSION: Balance and strength interventions PD specific interventions as indicated    Norman Herrlich PT ,DPT Physical Therapist- [facility]  08/24/23, 2:09 PM

## 2023-08-27 ENCOUNTER — Ambulatory Visit: Payer: Medicare Other | Admitting: Physical Therapy

## 2023-08-27 DIAGNOSIS — R2689 Other abnormalities of gait and mobility: Secondary | ICD-10-CM

## 2023-08-27 DIAGNOSIS — M6281 Muscle weakness (generalized): Secondary | ICD-10-CM

## 2023-08-27 DIAGNOSIS — R2681 Unsteadiness on feet: Secondary | ICD-10-CM | POA: Diagnosis not present

## 2023-08-27 DIAGNOSIS — R262 Difficulty in walking, not elsewhere classified: Secondary | ICD-10-CM

## 2023-08-27 DIAGNOSIS — R269 Unspecified abnormalities of gait and mobility: Secondary | ICD-10-CM | POA: Diagnosis not present

## 2023-08-27 NOTE — Therapy (Signed)
 OUTPATIENT PHYSICAL THERAPY NEURO TREATMENT   Patient Name: Johnathan Arnold. MRN: [id number] DOB:07/30/1945, 78 y.o., male Today's Date: 08/27/2023  PCP: [name], MD  REFERRING PROVIDER: [name], MD   END OF SESSION:  PT End of Session - 08/27/23 1238     Visit Number 4    Number of Visits 24    Date for PT Re-Evaluation 11/09/23    Authorization Type Medicare, BCBS supplement    Progress Note Due on Visit 10    PT Start Time 1147    PT Stop Time 1228    PT Time Calculation (min) 41 min    Equipment Utilized During Treatment Gait belt    Activity Tolerance Patient tolerated treatment well    Behavior During Therapy WFL for tasks assessed/performed                Past Medical History:  Diagnosis Date   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 LIMA to LAD, SVG to D1, OM1 and RPDA   CORONARY ARTERY BYPASS GRAFT  03/1998   5 vessel, [facility]   RIGHT HEART CATH N/A 04/04/2019   Procedure: RIGHT HEART CATH;  Surgeon: Iran Ouch, MD;  Location: [facility] INVASIVE CV LAB;  Service: Cardiovascular;  Laterality: N/A;   RIGHT/LEFT HEART CATH AND CORONARY ANGIOGRAPHY N/A 02/01/2018   Procedure: RIGHT/LEFT HEART CATH AND CORONARY ANGIOGRAPHY;  Surgeon: Iran Ouch, MD;  Location: [facility] INVASIVE CV LAB;  Service: Cardiovascular;  Laterality: N/A;   Patient Active Problem List   Diagnosis Date Noted   Diverticulitis 07/26/2023   RSV (respiratory syncytial virus pneumonia) 07/26/2023   UTI (urinary tract infection) 07/02/2023   Impaired ambulation 07/02/2023   Orthostatic  hypotension 05/22/2023   Generalized weakness 05/07/2021   Bradycardia    Anemia, unspecified 02/07/2021   Neutropenia (HCC) 01/29/2021   Thrombocytopenia (HCC) 01/29/2021   Right lower lobe pneumonia 12/28/2020   Hyponatremia 12/22/2020   Bilateral leg weakness 12/20/2020   Prostate cancer screening 09/08/2020   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) 09/06/2020   Low back pain 08/19/2019   Pulmonary hypertension (HCC)    Insomnia 12/21/2018   Sleep apnea in adult 12/09/2018   Periodic limb movement disorder 12/09/2018   Pulmonary nodules 05/18/2018   Wears hearing aid in both ears 05/17/2018   Leg pain, bilateral 02/20/2018   Dyspnea    CKD stage 3a, GFR 45-59 ml/min (HCC) 09/21/2017   History of skin cancer in adulthood 08/14/2016   Parkinson's disease (HCC) 08/09/2016   Bilateral carotid artery stenosis 04/02/2015   Vertigo, peripheral 10/17/2014   Benign prostatic hyperplasia with urinary frequency 01/31/2014   Obesity 04/03/2013   Other malaise and fatigue 09/21/2012   Hyperlipidemia    Essential hypertension    3-vessel coronary artery disease    S/P CABG x 5     ONSET DATE: 07/03/23  REFERRING DIAG: M84.132 (ICD-10-CM) - Weakness of both lower extremities   THERAPY DIAG:  No diagnosis found.  Rationale for Evaluation and Treatment: Rehabilitation  SUBJECTIVE:                                                                                                                                                                                             SUBJECTIVE STATEMENT:  Patient reports he is feeling better today. No falls or LOB.   Pt accompanied by: significant other  PERTINENT HISTORY:  From prior PT evaluation: PD, history of COVID and significant weakness present following this diagnosis and the use of paxlovid ( August 5 dx date, thinks Paxlovid may have exacerbated PD symptoms)    Pt reports having COVID and having significant  weakness.  Patient reports he also has back pain that is not present for a long time but was exacerbated more recently.  Patient previously did physical therapy for his back and experienced some relief but has not been consistent with the exercises.  Patient also has history of going to Parkinson's rock steady classes multiple times per week prior to onset of his COVID but he has not been back since. Patient previously ambulated without an assistive device but is now ambulating with a straight point cane.  Patient reports increased foot and ankle weakness in comparison with his hips and knees.  Patient also reports low back pain that is exacerbated with prolonged standing.  PAIN:  Are you having pain? No  PRECAUTIONS: Fall  RED FLAGS: None   WEIGHT BEARING RESTRICTIONS: No  FALLS: Has patient fallen in last 6 months? Yes. Number of falls 6  LIVING ENVIRONMENT: Lives with: lives with their spouse Lives in: House/apartment Stairs: Yes: Internal: 15 steps; on left going up and External: 1 steps; none Has following equipment at home: Quad cane large base and Walker - 2 wheeled  PLOF: Independent  PATIENT GOALS: to improve overall strength, get back to walking more regularly  OBJECTIVE:  Note: Objective measures were completed at Evaluation unless otherwise noted.  DIAGNOSTIC FINDINGS:   EXAM: CT HEAD WITHOUT CONTRAST  IMPRESSION: 1. No acute intracranial process. 2. Mild chronic small vessel ischemic changes. 3. Acute right frontal and maxillary sinusitis.  COGNITION: Overall cognitive status: Within functional limits for tasks assessed   SENSATION: WFL  COORDINATION: WFL    LOWER EXTREMITY ROM:     Active  Right Eval Left Eval  Hip flexion    Hip extension    Hip abduction    Hip adduction    Hip internal rotation    Hip external rotation    Knee flexion    Knee extension    Ankle dorsiflexion    Ankle plantarflexion    Ankle inversion  Ankle eversion      (Blank rows = not tested)  LOWER EXTREMITY MMT:    MMT Right Eval Left Eval  Hip flexion 4 4  Hip abduction 4 4  Hip adduction 4 4  Knee flexion 3+ 3+  Knee extension 4- 4-  Ankle dorsiflexion 4+ 4  (Blank rows = not tested)  BED MOBILITY:  No limitations according to the pt.  TRANSFERS: Assistive device utilized: None  Sit to stand: Complete Independence Stand to sit: Complete Independence Chair to chair: Complete Independence Floor:  Not tested  FUNCTIONAL TESTS:  5 times sit to stand: 15.03 sec Timed up and go (TUG): 13.38 sec 6 minute walk test: TBD 10 meter walk test: 12.15 sec; 0.82 m/s Dynamic Gait Index: TBD  PATIENT SURVEYS:  ABC scale 70.6%                                                                                                                              TREATMENT DATE: 08/27/23 TE- To improve strength, endurance, mobility, and function of specific targeted muscle groups or improve joint range of motion or improve muscle flexibility Seated LAQ with 4# AW 2 x 10 Seated march with 4# AW 2 x 10    TA- To improve functional movements patterns for everyday tasks  Lateral lung and step over with goal of hip rotation 2 x 10 ea LE  Ambulation with RW and 4# AW x 490 ft, good posture, no c/o fatigue  NMR:  Octane level 2 x 6 min for B UE and LE reciprocal movement training.   Seated PWR! Twist x 10 reps ea side  Standing PWR! Up with goal to reach and touch highest possible on wall x 10 ea  Standing PWR! Up with postural boost with RTB x 10   PWR! Up targets postural strengthening and antigravity extension, PRW! Rock targets functional weight shifting, PRW! Twist targets trunk rotation and PRW! Step targets transition movements. PWR! Moves target bradykinesia, rigidity, and dyskinesia through targeted functional movements that address four core movement difficulties for people with Parkinson's disease.     PATIENT EDUCATION: Education details:  Pt educated throughout session about proper posture and technique with exercises. Improved exercise technique, movement at target joints, use of target muscles after min to mod verbal, visual, tactile cues  Person educated: Patient and Spouse Education method: Explanation, Demonstration, and Verbal cues Education comprehension: verbalized understanding  HOME EXERCISE PROGRAM: Deferred to next session  GOALS: Goals reviewed with patient? Yes  SHORT TERM GOALS: Target date: 09/14/2023    Pt will be independent with HEP in order to demonstrate increased ability to perform tasks related to occupation/hobbies. Baseline:  Pt to be given HEP at next visit. Goal status: INITIAL  LONG TERM GOALS: Target date: 11/09/2023  1.  Patient (> 69 years old) will complete five times sit to stand test in < 15 seconds indicating an increased LE strength and improved balance. Baseline:  15.03 sec Goal status: INITIAL  2.  Patient will increase their ABC scale score to be 80.6% (an increase of 10 points), demonstrating improved balance confidence and reduced fall risk, as measured through weekly assessments. Baseline: 70.6% Goal status: INITIAL   3.  Patient will increase Berg Balance score by > 6 points to demonstrate decreased fall risk during functional activities. Baseline: TBD Goal status: INITIAL   4.  Patient will reduce timed up and go to <11 seconds to reduce fall risk and demonstrate improved transfer/gait ability. Baseline: 13.38 sec Goal status: INITIAL  5.  Patient will increase 10 meter walk test to >1.78m/s as to improve gait speed for better community ambulation and to reduce fall risk. Baseline: 12.15 sec; 0.82 m/s Goal status: INITIAL  6.  Patient will increase six minute walk test distance to >1000 for progression to community ambulator and improve gait ability Baseline: 459 ft with RW stopping at 5:30 due to fatigue  Goal status: INITIAL   ASSESSMENT:  CLINICAL  IMPRESSION:  Patient arrived with good motivation for completion of pt activities.  Pt showed improved tolerance for functional and standing activities this date. Pt also has improved speed with ambulation although not officially measured. Progressed many activities this date. Pt will continue to benefit from skilled physical therapy intervention to address impairments, improve QOL, and attain therapy goals.      OBJECTIVE IMPAIRMENTS: Abnormal gait, decreased activity tolerance, decreased balance, decreased endurance, decreased knowledge of use of DME, decreased mobility, difficulty walking, decreased ROM, decreased strength, and decreased safety awareness.   ACTIVITY LIMITATIONS: carrying, lifting, bending, standing, squatting, bathing, and locomotion level  PARTICIPATION LIMITATIONS: cleaning, laundry, driving, shopping, community activity, and yard work  PERSONAL FACTORS: Age, Education, Past/current experiences, Time since onset of injury/illness/exacerbation, and 3+ comorbidities: orthostatic HTN, syncope, falls, anemia, insomnia, dyspnea  are also affecting patient's functional outcome.   REHAB POTENTIAL: Fair pt has been seen in the past.  CLINICAL DECISION MAKING: Evolving/moderate complexity  EVALUATION COMPLEXITY: Moderate  PLAN:  PT FREQUENCY: 2x/week  PT DURATION: 12 weeks  PLANNED INTERVENTIONS: 97110-Therapeutic exercises, 97530- Therapeutic activity, 97112- Neuromuscular re-education, 97535- Self Care, 16109- Manual therapy, [phone]- Gait training, Balance training, Stair training, and Vestibular training  PLAN FOR NEXT SESSION: Balance and strength interventions PD specific interventions as indicated    Norman Herrlich PT ,DPT Physical Therapist- [facility]  08/27/23, 12:39 PM

## 2023-08-31 ENCOUNTER — Other Ambulatory Visit: Payer: Self-pay | Admitting: Cardiovascular Disease

## 2023-08-31 ENCOUNTER — Other Ambulatory Visit: Payer: Self-pay | Admitting: Neurology

## 2023-08-31 ENCOUNTER — Ambulatory Visit: Payer: Medicare Other | Admitting: Physical Therapy

## 2023-08-31 DIAGNOSIS — R2681 Unsteadiness on feet: Secondary | ICD-10-CM | POA: Diagnosis not present

## 2023-08-31 DIAGNOSIS — R269 Unspecified abnormalities of gait and mobility: Secondary | ICD-10-CM

## 2023-08-31 DIAGNOSIS — R2689 Other abnormalities of gait and mobility: Secondary | ICD-10-CM | POA: Diagnosis not present

## 2023-08-31 DIAGNOSIS — M6281 Muscle weakness (generalized): Secondary | ICD-10-CM

## 2023-08-31 DIAGNOSIS — R262 Difficulty in walking, not elsewhere classified: Secondary | ICD-10-CM | POA: Diagnosis not present

## 2023-08-31 DIAGNOSIS — G20A1 Parkinson's disease without dyskinesia, without mention of fluctuations: Secondary | ICD-10-CM

## 2023-08-31 NOTE — Therapy (Signed)
 OUTPATIENT PHYSICAL THERAPY NEURO TREATMENT   Patient Name: Johnathan Arnold. MRN: [id number] DOB:August 05, 1945, 78 y.o., male Today's Date: 08/31/2023  PCP: [name], MD  REFERRING PROVIDER: [name], MD   END OF SESSION:  PT End of Session - 08/31/23 1301     Visit Number 5    Number of Visits 24    Date for PT Re-Evaluation 11/09/23    Authorization Type Medicare, BCBS supplement    Progress Note Due on Visit 10    PT Start Time 1145    PT Stop Time 1225    PT Time Calculation (min) 40 min    Equipment Utilized During Treatment Gait belt    Activity Tolerance Patient tolerated treatment well    Behavior During Therapy WFL for tasks assessed/performed                 Past Medical History:  Diagnosis Date   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 LIMA to LAD, SVG to D1, OM1 and RPDA   CORONARY ARTERY BYPASS GRAFT  03/1998   5 vessel, [facility]   RIGHT HEART CATH N/A 04/04/2019   Procedure: RIGHT HEART CATH;  Surgeon: Iran Ouch, MD;  Location: [facility] INVASIVE CV LAB;  Service: Cardiovascular;  Laterality: N/A;   RIGHT/LEFT HEART CATH AND CORONARY ANGIOGRAPHY N/A 02/01/2018   Procedure: RIGHT/LEFT HEART CATH AND CORONARY ANGIOGRAPHY;  Surgeon: Iran Ouch, MD;  Location: [facility] INVASIVE CV LAB;  Service: Cardiovascular;  Laterality: N/A;   Patient Active Problem List   Diagnosis Date Noted   Diverticulitis 07/26/2023   RSV (respiratory syncytial virus pneumonia) 07/26/2023   UTI (urinary tract infection) 07/02/2023   Impaired ambulation 07/02/2023   Orthostatic  hypotension 05/22/2023   Generalized weakness 05/07/2021   Bradycardia    Anemia, unspecified 02/07/2021   Neutropenia (HCC) 01/29/2021   Thrombocytopenia (HCC) 01/29/2021   Right lower lobe pneumonia 12/28/2020   Hyponatremia 12/22/2020   Bilateral leg weakness 12/20/2020   Prostate cancer screening 09/08/2020   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) 09/06/2020   Low back pain 08/19/2019   Pulmonary hypertension (HCC)    Insomnia 12/21/2018   Sleep apnea in adult 12/09/2018   Periodic limb movement disorder 12/09/2018   Pulmonary nodules 05/18/2018   Wears hearing aid in both ears 05/17/2018   Leg pain, bilateral 02/20/2018   Dyspnea    CKD stage 3a, GFR 45-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/min (HCC) 09/21/2017   History of skin cancer in adulthood 08/14/2016   Parkinson's disease (HCC) 08/09/2016   Bilateral carotid artery stenosis 04/02/2015   Vertigo, peripheral 10/17/2014   Benign prostatic hyperplasia with urinary frequency 01/31/2014   Obesity 04/03/2013   Other malaise and fatigue 09/21/2012   Hyperlipidemia    Essential hypertension    3-vessel coronary artery disease    S/P CABG x 5     ONSET DATE: 07/03/23  REFERRING DIAG: W09.811 (ICD-10-CM) - Weakness of both lower extremities   THERAPY DIAG:  Unsteadiness on feet  Difficulty in walking, not elsewhere  classified  Other abnormalities of gait and mobility  Abnormality of gait and mobility  Muscle weakness (generalized)  Rationale for Evaluation and Treatment: Rehabilitation  SUBJECTIVE:                                                                                                                                                                                             SUBJECTIVE STATEMENT:  Patient reports he is feeling better today. No falls or LOB. Didn't sleep well but otherwise alright.   Pt accompanied by: significant other  PERTINENT HISTORY:  From prior PT evaluation: PD, history of COVID  and significant weakness present following this diagnosis and the use of paxlovid ( August 5 dx date, thinks Paxlovid may have exacerbated PD symptoms)    Pt reports having COVID and having significant weakness.  Patient reports he also has back pain that is not present for a long time but was exacerbated more recently.  Patient previously did physical therapy for his back and experienced some relief but has not been consistent with the exercises.  Patient also has history of going to Parkinson's rock steady classes multiple times per week prior to onset of his COVID but he has not been back since. Patient previously ambulated without an assistive device but is now ambulating with a straight point cane.  Patient reports increased foot and ankle weakness in comparison with his hips and knees.  Patient also reports low back pain that is exacerbated with prolonged standing.  PAIN:  Are you having pain? No  PRECAUTIONS: Fall  RED FLAGS: None   WEIGHT BEARING RESTRICTIONS: No  FALLS: Has patient fallen in last 6 months? Yes. Number of falls 6  LIVING ENVIRONMENT: Lives with: lives with their spouse Lives in: House/apartment Stairs: Yes: Internal: 15 steps; on left going up and External: 1 steps; none Has following equipment at home: Quad cane large base and Walker - 2 wheeled  PLOF: Independent  PATIENT GOALS: to improve overall strength, get back to walking more regularly  OBJECTIVE:  Note: Objective measures were completed at Evaluation unless otherwise noted.  DIAGNOSTIC FINDINGS:   EXAM: CT HEAD WITHOUT CONTRAST  IMPRESSION: 1. No acute intracranial process. 2. Mild chronic small vessel ischemic changes. 3. Acute right frontal and maxillary sinusitis.  COGNITION: Overall cognitive status: Within functional limits for tasks assessed   SENSATION: WFL  COORDINATION: WFL    LOWER EXTREMITY ROM:     Active  Right Eval Left Eval  Hip flexion    Hip extension    Hip  abduction    Hip adduction    Hip internal rotation  Hip external rotation    Knee flexion    Knee extension    Ankle dorsiflexion    Ankle plantarflexion    Ankle inversion    Ankle eversion     (Blank rows = not tested)  LOWER EXTREMITY MMT:    MMT Right Eval Left Eval  Hip flexion 4 4  Hip abduction 4 4  Hip adduction 4 4  Knee flexion 3+ 3+  Knee extension 4- 4-  Ankle dorsiflexion 4+ 4  (Blank rows = not tested)  BED MOBILITY:  No limitations according to the pt.  TRANSFERS: Assistive device utilized: None  Sit to stand: Complete Independence Stand to sit: Complete Independence Chair to chair: Complete Independence Floor:  Not tested  FUNCTIONAL TESTS:  5 times sit to stand: 15.03 sec Timed up and go (TUG): 13.38 sec 6 minute walk test: TBD 10 meter walk test: 12.15 sec; 0.82 m/s Dynamic Gait Index: TBD  PATIENT SURVEYS:  ABC scale 70.6%                                                                                                                              TREATMENT DATE: 08/31/23 TE- To improve strength, endurance, mobility, and function of specific targeted muscle groups or improve joint range of motion or improve muscle flexibility Seated LAQ with 4# AW 2 x 15 Seated march with 4# AW 2 x 15    TA- To improve functional movements patterns for everyday tasks  STS PWR! Up 2 x 10  Lateral lung and step over with goal of hip rotation 2 x 10 ea LE  Ambulation with 4WW and 4# AW x 750 ft, good posture, no c/o fatigue, goood foot clearance  NMR:  Octane level 2 x 6 min for B UE and LE reciprocal movement training.   Seated PWR! Up with posture boost 2 x 12 reps  Seated PWR! Twist x 10 reps ea side  Seated PWR! Twist and step Handout of above provided for home use.   PWR! Up targets postural strengthening and antigravity extension, PRW! Rock targets functional weight shifting, PRW! Twist targets trunk rotation and PRW! Step targets transition  movements. PWR! Moves target bradykinesia, rigidity, and dyskinesia through targeted functional movements that address four core movement difficulties for people with Parkinson's disease.     PATIENT EDUCATION: Education details: Pt educated throughout session about proper posture and technique with exercises. Improved exercise technique, movement at target joints, use of target muscles after min to mod verbal, visual, tactile cues  Person educated: Patient and Spouse Education method: Explanation, Demonstration, and Verbal cues Education comprehension: verbalized understanding  HOME EXERCISE PROGRAM: Seated PWR! Moves x 10 ea, handout provided   GOALS: Goals reviewed with patient? Yes  SHORT TERM GOALS: Target date: 09/14/2023    Pt will be independent with HEP in order to demonstrate increased ability to perform tasks related to occupation/hobbies. Baseline:  Pt to be given HEP at  next visit. Goal status: INITIAL  LONG TERM GOALS: Target date: 11/09/2023  1.  Patient (> 97 years old) will complete five times sit to stand test in < 15 seconds indicating an increased LE strength and improved balance. Baseline: 15.03 sec Goal status: INITIAL  2.  Patient will increase their ABC scale score to be 80.6% (an increase of 10 points), demonstrating improved balance confidence and reduced fall risk, as measured through weekly assessments. Baseline: 70.6% Goal status: INITIAL   3.  Patient will increase Berg Balance score by > 6 points to demonstrate decreased fall risk during functional activities. Baseline: TBD Goal status: INITIAL   4.  Patient will reduce timed up and go to <11 seconds to reduce fall risk and demonstrate improved transfer/gait ability. Baseline: 13.38 sec Goal status: INITIAL  5.  Patient will increase 10 meter walk test to >1.51m/s as to improve gait speed for better community ambulation and to reduce fall risk. Baseline: 12.15 sec; 0.82 m/s Goal status:  INITIAL  6.  Patient will increase six minute walk test distance to >1000 for progression to community ambulator and improve gait ability Baseline: 459 ft with RW stopping at 5:30 due to fatigue  Goal status: INITIAL   ASSESSMENT:  CLINICAL IMPRESSION:  Patient arrived with good motivation for completion of pt activities.  Pt showed continued improvement with ambulatory endurance and fatigue. Pt provided with HEP handout for home use and is eager to start these exercises at home. Pt will continue to benefit from skilled physical therapy intervention to address impairments, improve QOL, and attain therapy goals.      OBJECTIVE IMPAIRMENTS: Abnormal gait, decreased activity tolerance, decreased balance, decreased endurance, decreased knowledge of use of DME, decreased mobility, difficulty walking, decreased ROM, decreased strength, and decreased safety awareness.   ACTIVITY LIMITATIONS: carrying, lifting, bending, standing, squatting, bathing, and locomotion level  PARTICIPATION LIMITATIONS: cleaning, laundry, driving, shopping, community activity, and yard work  PERSONAL FACTORS: Age, Education, Past/current experiences, Time since onset of injury/illness/exacerbation, and 3+ comorbidities: orthostatic HTN, syncope, falls, anemia, insomnia, dyspnea  are also affecting patient's functional outcome.   REHAB POTENTIAL: Fair pt has been seen in the past.  CLINICAL DECISION MAKING: Evolving/moderate complexity  EVALUATION COMPLEXITY: Moderate  PLAN:  PT FREQUENCY: 2x/week  PT DURATION: 12 weeks  PLANNED INTERVENTIONS: 97110-Therapeutic exercises, 97530- Therapeutic activity, 97112- Neuromuscular re-education, 97535- Self Care, 16109- Manual therapy, [phone]- Gait training, Balance training, Stair training, and Vestibular training  PLAN FOR NEXT SESSION: Balance and strength interventions PD specific interventions as indicated    Norman Herrlich PT ,DPT Physical Therapist- [facility]  Health  [facility]  08/31/23, 1:01 PM

## 2023-09-01 ENCOUNTER — Encounter: Payer: Self-pay | Admitting: Internal Medicine

## 2023-09-01 ENCOUNTER — Ambulatory Visit (INDEPENDENT_AMBULATORY_CARE_PROVIDER_SITE_OTHER): Payer: Medicare Other | Admitting: Internal Medicine

## 2023-09-01 VITALS — BP 126/54 | HR 65 | Ht 66.0 in | Wt 154.0 lb

## 2023-09-01 DIAGNOSIS — J189 Pneumonia, unspecified organism: Secondary | ICD-10-CM

## 2023-09-01 DIAGNOSIS — J121 Respiratory syncytial virus pneumonia: Secondary | ICD-10-CM | POA: Diagnosis not present

## 2023-09-01 DIAGNOSIS — I951 Orthostatic hypotension: Secondary | ICD-10-CM | POA: Diagnosis not present

## 2023-09-01 DIAGNOSIS — N1831 Chronic kidney disease, stage 3a: Secondary | ICD-10-CM

## 2023-09-01 DIAGNOSIS — I272 Pulmonary hypertension, unspecified: Secondary | ICD-10-CM | POA: Diagnosis not present

## 2023-09-01 DIAGNOSIS — G4730 Sleep apnea, unspecified: Secondary | ICD-10-CM | POA: Diagnosis not present

## 2023-09-01 DIAGNOSIS — I10 Essential (primary) hypertension: Secondary | ICD-10-CM | POA: Diagnosis not present

## 2023-09-01 MED ORDER — TRAZODONE HCL 50 MG PO TABS: 25.0000 mg | ORAL_TABLET | Freq: Every evening | ORAL | 3 refills | Status: DC | PRN

## 2023-09-01 NOTE — Assessment & Plan Note (Signed)
 Resume CPAP to reduce strain on heart

## 2023-09-01 NOTE — Assessment & Plan Note (Addendum)
 Resuming losartan and suspending hydralazine for elevated UaCr

## 2023-09-01 NOTE — Assessment & Plan Note (Signed)
 Losartan was stopped due to hypotension. . Will resume

## 2023-09-01 NOTE — Assessment & Plan Note (Signed)
 With corrected UACR of 53 (Aguust 2024) no longer taking an ARB .  No prior SGLT 2 inhibitor

## 2023-09-01 NOTE — Patient Instructions (Addendum)
 By now you have probably received the message from [facility] about a miscalculation on the test called the UACR,  this is the annual urine test that measures the albumin to creatinine ratio and  helps [place] determine if early kidney disease is present.   I have reviewed your  previous test which was done in August  .  The corrected ratio is abnormal and warrants a repeat trial of losartan     Stop the hydralazine and resume losartan at 50 mg daily  .   Return in one week for labs  I have included the ones Dr Thedore Mins needs   I will talk with shannon about future use of myrbetriq.  Continue using CPAP    I am prescribing trazodone for insomnia .  Max dose 50 mg .  If not helpful ,  try relaxium . It is available through [employer] and contains all natural supplements:  Melatonin 5 mg  Chamomile 25 mg Passionflower extract 75 mg GABA 100 mg Ashwaganda extract 125 mg Magnesium citrate, glycinate, oxide (100 mg)  L tryptophan 500 mg Valerest (proprietary  ingredient ; probably valeria root extract)

## 2023-09-01 NOTE — Assessment & Plan Note (Signed)
 He was  positive for multiple weeks-likely confounded to decompensated weakness./p  completed acourse of antibiotics for lobar pna. CT Imaging negative for any focal infiltrate .

## 2023-09-01 NOTE — Assessment & Plan Note (Signed)
 Moderate to severe,  By ECHO and right heart  Cath Oct 2020.  May benefit from sildenafil and resume use of CPAP

## 2023-09-01 NOTE — Assessment & Plan Note (Signed)
 Bilateral pneumonia left greater than right-superimposed on left lower lobe nodularity clinically improved on antibiotics.  Chest x-ray last month showed decreased infiltrates in the left lower lobe.  We will repeat chest x-ray today.  And will need follow-up CT scan and 8 weeks.  Patient is at risk for possible aspiration with his underlying Parkinson's.  Currently not complaining of any dysphagia or choking.  If ongoing symptoms or recurrent pneumonia consider setting up for a modified barium swallow/speech evaluation for dysphagia

## 2023-09-01 NOTE — Progress Notes (Signed)
 Subjective:  Patient ID: Johnathan Arnold., male    DOB: 1946/02/09  Age: 78 y.o. MRN: [id number]  CC: The primary encounter diagnosis was CKD stage 3a, GFR 45-59 ml/min (HCC). Diagnoses of Sleep apnea in adult, RSV (respiratory syncytial virus pneumonia), Pulmonary hypertension (HCC), Orthostatic hypotension, Pneumonia of right lower lobe due to infectious organism, and Essential hypertension were also pertinent to this visit.   HPI Johnathan Arnold. presents for  Chief Complaint  Patient presents with   Hospitalization Follow-up   Johnathan Arnold is a 78 yr old male with parkinsons Disease and CAD who has had 3 hospitalizations since January, for pneumonia, diverticulitis, and generalized weakness.    His most recent  hospitalization was for altered mental status which he attributes to taking ed  Tussionex , prescribed after 2nd hospitalization , with chronic  use of clonazepam, which was not discontinued due to chronic daily use.   At discharge he was also prescribed ropinirole as a substitute for clonazepam for RLS but thankfully he  checked with Dr Tat who DISAGREED WITH CONCURRENT USE OF SINEMET and ropinirole.    CC:  NOT RESTING AT NIGHT.  NOT SLEEPING   HAVING NOCTURIA EVERY 1-2 HOURS for the past several months.  Had urology follow up post discharge   was ADVISED to resume  use of CPAP  which he has not been wearing    Outpatient Medications Prior to Visit  Medication Sig Dispense Refill   acetaminophen (TYLENOL) 500 MG tablet Take 500 mg by mouth as needed.     aspirin EC 81 MG tablet Take 1 tablet (81 mg total) by mouth at bedtime. 30 tablet 11   carbidopa-levodopa (SINEMET IR) 25-100 MG tablet TAKE 2 TABLETS AT 8:00AM , 2 TABLETS AT 11:00AM, 2 TABLETS AT 2:00PM AND 1 TABLET AT 5:00PM 630 tablet 0   furosemide (LASIX) 20 MG tablet Take 1 tablet (20 mg total) by mouth daily as needed (for swelling). 90 tablet 3   hydrALAZINE (APRESOLINE) 100 MG tablet Take 1 tablet (100 mg total) by  mouth 3 (three) times daily.     hydrocortisone cream 1 % Apply topically 2 (two) times daily.     isosorbide mononitrate (IMDUR) 60 MG 24 hr tablet TAKE 1 TABLET BY MOUTH DAILY 90 tablet 2   melatonin 3 MG TABS tablet Take 1.5 mg by mouth at bedtime as needed.     Multiple Vitamin (MULTIVITAMIN) tablet Take 1 tablet by mouth daily.     nitroGLYCERIN (NITROSTAT) 0.4 MG SL tablet Place 1 tablet (0.4 mg total) under the tongue every 5 (five) minutes as needed for chest pain. 25 tablet 1   omeprazole (PRILOSEC) 20 MG capsule Take 1 capsule (20 mg total) by mouth every morning. 90 capsule 3   ondansetron (ZOFRAN) 4 MG tablet Take 1 tablet (4 mg total) by mouth every 6 (six) hours as needed for nausea or vomiting. 20 tablet 0   polyethylene glycol (MIRALAX / GLYCOLAX) packet Take 17 g by mouth daily as needed.     rosuvastatin (CRESTOR) 10 MG tablet TAKE 1 TABLET BY MOUTH DAILY 90 tablet 0   tamsulosin (FLOMAX) 0.4 MG CAPS capsule Take 1 capsule (0.4 mg total) by mouth daily. 90 capsule 3   olopatadine (PATANOL) 0.1 % ophthalmic solution Place 1 drop into both eyes 2 (two) times daily. (Patient not taking: Reported on 09/01/2023)     rOPINIRole (REQUIP) 1 MG tablet Take 1 tablet (1 mg total) by mouth  daily as needed (restless legs). (Patient not taking: Reported on 09/01/2023)     No facility-administered medications prior to visit.    Review of Systems;  Patient denies headache, fevers, malaise, unintentional weight loss, skin rash, eye pain, sinus congestion and sinus pain, sore throat, dysphagia,  hemoptysis , cough, dyspnea, wheezing, chest pain, palpitations, orthopnea, edema, abdominal pain, nausea, melena, diarrhea, constipation, flank pain, dysuria, hematuria, urinary  Frequency, nocturia, numbness, tingling, seizures,  Focal weakness, Loss of consciousness,  Tremor, insomnia, depression, anxiety, and suicidal ideation.      Objective:  BP (!) 126/54   Pulse 65   Ht 5\' 6"  (1.676 m)   Wt  154 lb (69.9 kg)   SpO2 98%   BMI 24.86 kg/m   BP Readings from Last 3 Encounters:  09/01/23 (!) 126/54  08/07/23 (!) 142/46  07/31/23 (!) 156/59    Wt Readings from Last 3 Encounters:  09/01/23 154 lb (69.9 kg)  08/04/23 175 lb (79.4 kg)  07/26/23 163 lb 2.3 oz (74 kg)    Physical Exam Vitals reviewed.  Constitutional:      General: He is not in acute distress.    Appearance: Normal appearance. He is normal weight. He is not ill-appearing, toxic-appearing or diaphoretic.  HENT:     Head: Normocephalic.  Eyes:     General: No scleral icterus.       Right eye: No discharge.        Left eye: No discharge.     Conjunctiva/sclera: Conjunctivae normal.  Cardiovascular:     Rate and Rhythm: Normal rate and regular rhythm.     Heart sounds: Normal heart sounds.  Pulmonary:     Effort: Pulmonary effort is normal. No respiratory distress.     Breath sounds: Normal breath sounds.  Musculoskeletal:        General: Normal range of motion.     Cervical back: Normal range of motion.  Skin:    General: Skin is warm and dry.  Neurological:     General: No focal deficit present.     Mental Status: He is alert and oriented to person, place, and time. Mental status is at baseline.  Psychiatric:        Mood and Affect: Mood normal.        Behavior: Behavior normal.        Thought Content: Thought content normal.        Judgment: Judgment normal.    Lab Results  Component Value Date   HGBA1C 5.9 09/17/2022   HGBA1C 5.7 03/18/2022   HGBA1C 5.4 05/07/2021    Lab Results  Component Value Date   CREATININE 1.23 08/07/2023   CREATININE 1.32 (H) 08/06/2023   CREATININE 1.27 (H) 08/04/2023    Lab Results  Component Value Date   WBC 4.4 08/06/2023   HGB 10.1 (L) 08/06/2023   HCT 28.5 (L) 08/06/2023   PLT 266 08/06/2023   GLUCOSE 97 08/07/2023   CHOL 113 09/17/2022   TRIG 86.0 09/17/2022   HDL 60.10 09/17/2022   LDLDIRECT 32.0 09/17/2022   LDLCALC 36 09/17/2022   ALT 17  08/06/2023   AST 19 08/06/2023   NA 139 08/07/2023   K 3.6 08/07/2023   CL 109 08/07/2023   CREATININE 1.23 08/07/2023   BUN 12 08/07/2023   CO2 22 08/07/2023   TSH 2.04 09/17/2022   PSA 1.10 09/06/2020   HGBA1C 5.9 09/17/2022   MICROALBUR 10.9 (H) 01/21/2023    CT HEAD WO CONTRAST (  ) Result Date: 08/05/2023 CLINICAL DATA:  Mental status change EXAM: CT HEAD WITHOUT CONTRAST TECHNIQUE: Contiguous axial images were obtained from the base of the skull through the vertex without intravenous contrast. RADIATION DOSE REDUCTION: This exam was performed according to the departmental dose-optimization program which includes automated exposure control, adjustment of the mA and/or kV according to patient size and/or use of iterative reconstruction technique. COMPARISON:  MRI brain 03/30/2023 FINDINGS: Brain: No evidence of acute infarction, hemorrhage, hydrocephalus, extra-axial collection or mass lesion/mass effect. There is mild periventricular white matter hypodensity, likely chronic small vessel ischemic change. Note is made that there has been recent contrast administration. Vascular: Atherosclerotic calcifications are present within the cavernous internal carotid arteries. Skull: Normal. Negative for fracture or focal lesion. Sinuses/Orbits: Mucosal thickening and air-fluid levels are seen in the right frontal and right maxillary sinus. There is mucosal thickening of the left frontal sinus, sphenoid sinuses and ethmoid air cells. Mastoid air cells are clear. Orbits are within normal limits. Other: None. IMPRESSION: 1. No acute intracranial process. 2. Mild chronic small vessel ischemic changes. 3. Acute right frontal and maxillary sinusitis. Electronically Signed   By: Darliss Cheney M.D.   On: 08/05/2023 00:59   CT CHEST ABDOMEN PELVIS W CONTRAST Result Date: 08/05/2023 CLINICAL DATA:  Sepsis EXAM: CT CHEST, ABDOMEN, AND PELVIS WITH CONTRAST TECHNIQUE: Multidetector CT imaging of the chest, abdomen  and pelvis was performed following the standard protocol during bolus administration of intravenous contrast. RADIATION DOSE REDUCTION: This exam was performed according to the departmental dose-optimization program which includes automated exposure control, adjustment of the mA and/or kV according to patient size and/or use of iterative reconstruction technique. CONTRAST:  OMNIPAQUE IOHEXOL 300 MG/ML  SOLN COMPARISON:  CT chest 07/27/2023. CT abdomen and pelvis 07/26/2023 CT of the chest 05/07/2021. FINDINGS: CT CHEST FINDINGS Cardiovascular: Heart is mildly enlarged. Aorta is normal in size. Patient is status post cardiac surgery. There are atherosclerotic calcifications of the aorta. There is no pericardial effusion. Mediastinum/Nodes: There is a 3 mm hypodense right thyroid nodule which is unchanged. There is an enlarged subcarinal lymph node measuring 1 cm which has decreased in size. No other enlarged lymph nodes are seen. Lungs/Pleura: There are trace bilateral pleural effusions similar to the prior study. There is a stable 5 mm right apical nodule. Previously identified patchy airspace opacities in the right lower lobe have resolved. Stable focal nodular density measuring 7 mm in the left lung base. These are unchanged from 2022 and favored as benign. No pneumothorax. No new focal lung consolidation. Musculoskeletal: Sternotomy wires are present. No acute fractures are seen. CT ABDOMEN PELVIS FINDINGS Hepatobiliary: No focal liver abnormality is seen. No gallstones, gallbladder wall thickening, or biliary dilatation. Pancreas: There is a fat containing lesion in the tail of the pancreas which is rounded measuring 9 mm compatible with lipoma, unchanged. Otherwise, the pancreas appears within normal limits. Spleen: Normal in size without focal abnormality. Adrenals/Urinary Tract: Adrenal glands are unremarkable. Kidneys are normal, without renal calculi, focal lesion, or hydronephrosis. Bladder is  unremarkable. Stomach/Bowel: Stomach is within normal limits. Appendix appears normal. No evidence of bowel wall thickening, distention, or inflammatory changes. There is diffuse colonic diverticulosis. Vascular/Lymphatic: Aortic atherosclerosis. No enlarged abdominal or pelvic lymph nodes. Reproductive: Prostate gland is heterogeneous and enlarged. Other: There is trace free fluid in the pelvis. No focal abdominal wall hernia. Musculoskeletal: Degenerative changes affect the spine and hips. IMPRESSION: 1. No acute localizing process in the chest, abdomen or pelvis. 2.  Stable trace bilateral pleural effusions. 3. Stable pancreatic lipoma. 4. Colonic diverticulosis. 5. Trace free fluid in the pelvis. Aortic Atherosclerosis (ICD10-I70.0). Electronically Signed   By: Darliss Cheney M.D.   On: 08/05/2023 00:03   DG Chest 2 View Result Date: 08/04/2023 CLINICAL DATA:  Weakness EXAM: CHEST - 2 VIEW COMPARISON:  X-ray 07/26/2023 and older.  CT 07/27/2023. FINDINGS: Sternal wires. No consolidation, pneumothorax or effusion. Normal cardiopericardial silhouette. Mild interstitial prominence. Degenerative changes of the spine. IMPRESSION: Postop chest.  Chronic changes. Electronically Signed   By: Karen Kays M.D.   On: 08/04/2023 20:28    Assessment & Plan:  .CKD stage 3a, GFR 45-59 ml/min (HCC) Assessment & Plan: With corrected UACR of 53 (Aguust 2024) no longer taking an ARB .  No prior SGLT 2 inhibitor   Orders: -     CBC with Differential/Platelet; Future -     PTH, intact and calcium; Future -     Magnesium; Future -     Renal function panel; Future -     Protein / creatinine ratio, urine; Future  Sleep apnea in adult Assessment & Plan:  Resume CPAP to reduce strain on heart   RSV (respiratory syncytial virus pneumonia) Assessment & Plan: He was  positive for multiple weeks-likely confounded to decompensated weakness./p  completed acourse of antibiotics for lobar pna. CT Imaging negative for any  focal infiltrate .    Pulmonary hypertension (HCC) Assessment & Plan: Moderate to severe,  By ECHO and right heart  Cath Oct 2020.  May benefit from sildenafil and resume use of CPAP   Orthostatic hypotension Assessment & Plan: Losartan was stopped due to hypotension. . Will resume    Pneumonia of right lower lobe due to infectious organism Assessment & Plan: Bilateral pneumonia left greater than right-superimposed on left lower lobe nodularity clinically improved on antibiotics.  Chest x-ray last month showed decreased infiltrates in the left lower lobe.  We will repeat chest x-ray today.  And will need follow-up CT scan and 8 weeks.  Patient is at risk for possible aspiration with his underlying Parkinson's.  Currently not complaining of any dysphagia or choking.  If ongoing symptoms or recurrent pneumonia consider setting up for a modified barium swallow/speech evaluation for dysphagia      Essential hypertension Assessment & Plan: Resuming losartan and suspending hydralazine for elevated UaCr    Other orders -     traZODone HCl; Take 0.5-1 tablets (25-50 mg total) by mouth at bedtime as needed for sleep.  Dispense: 30 tablet; Refill: 3     I spent 50 minutes on the day of this face to face encounter reviewing patient's last 3 hospitalizations, his most recent visit with neurology and cardiology and nephrology,   prior relevant surgical and non surgical procedures, recent  labs and imaging studies, counseling on hypertension management,  reviewing the assessment and plan with patient, and post visit ordering and reviewing of  diagnostics and therapeutics with patient  .   Follow-up: Return in about 3 months (around 12/02/2023).   [name], MD

## 2023-09-03 ENCOUNTER — Ambulatory Visit: Payer: Medicare Other | Admitting: Physical Therapy

## 2023-09-03 DIAGNOSIS — R2681 Unsteadiness on feet: Secondary | ICD-10-CM | POA: Diagnosis not present

## 2023-09-03 DIAGNOSIS — M6281 Muscle weakness (generalized): Secondary | ICD-10-CM | POA: Diagnosis not present

## 2023-09-03 DIAGNOSIS — R2689 Other abnormalities of gait and mobility: Secondary | ICD-10-CM

## 2023-09-03 DIAGNOSIS — R262 Difficulty in walking, not elsewhere classified: Secondary | ICD-10-CM | POA: Diagnosis not present

## 2023-09-03 DIAGNOSIS — R269 Unspecified abnormalities of gait and mobility: Secondary | ICD-10-CM | POA: Diagnosis not present

## 2023-09-03 NOTE — Therapy (Signed)
 OUTPATIENT PHYSICAL THERAPY NEURO TREATMENT   Patient Name: Johnathan Arnold. MRN: [id number] DOB:1946-04-27, 78 y.o., male Today's Date: 09/03/2023  PCP: [name], MD  REFERRING PROVIDER: [name], MD   END OF SESSION:  PT End of Session - 09/03/23 1109     Visit Number 6    Number of Visits 24    Date for PT Re-Evaluation 11/09/23    Authorization Type Medicare, BCBS supplement    Progress Note Due on Visit 10    PT Start Time 1100    PT Stop Time 1140    PT Time Calculation (min) 40 min    Equipment Utilized During Treatment Gait belt    Activity Tolerance Patient tolerated treatment well    Behavior During Therapy [facility] for tasks assessed/performed                 Past Medical History:  Diagnosis Date   3-vessel coronary artery disease    s/p  5 vessel CABG   Allergy June 2024   Diabetes mellitus without complication (HCC)    History of cardiac catheterization 2011   [facility]   Hyperlipidemia    Hypertension    Hypertriglyceridemia    Neuromuscular disorder [facility]) October 2017   Diagonis Parkinson   Parkinson's disease [facility])    Pneumonia 12/28/2020   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 LIMA to LAD, SVG to D1, OM1 and RPDA   CORONARY ARTERY BYPASS GRAFT  03/1998   5 vessel, [facility]   RIGHT HEART CATH N/A 04/04/2019   Procedure: RIGHT HEART CATH;  Surgeon: Iran Ouch, MD;  Location: [facility] INVASIVE CV LAB;  Service: Cardiovascular;  Laterality: N/A;   RIGHT/LEFT HEART CATH AND CORONARY ANGIOGRAPHY N/A 02/01/2018   Procedure: RIGHT/LEFT HEART CATH AND CORONARY ANGIOGRAPHY;  Surgeon: Iran Ouch, MD;  Location: [facility] INVASIVE CV LAB;  Service: Cardiovascular;  Laterality: N/A;   Patient Active Problem List   Diagnosis Date Noted   Diverticulitis 07/26/2023   RSV (respiratory syncytial virus pneumonia) 07/26/2023    Impaired ambulation 07/02/2023   Orthostatic hypotension 05/22/2023   Generalized weakness 05/07/2021   Bradycardia    Anemia, unspecified 02/07/2021   Neutropenia (HCC) 01/29/2021   Thrombocytopenia (HCC) 01/29/2021   Right lower lobe pneumonia 12/28/2020   Hyponatremia 12/22/2020   Bilateral leg weakness 12/20/2020   Prostate cancer screening 09/08/2020   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) 09/06/2020   Low back pain 08/19/2019   Pulmonary hypertension (HCC)    Insomnia 12/21/2018   Sleep apnea in adult 12/09/2018   Periodic limb movement disorder 12/09/2018   Pulmonary nodules 05/18/2018   Wears hearing aid in both ears 05/17/2018   Leg pain, bilateral 02/20/2018   Dyspnea    CKD stage 3a, GFR 45-59 ml/min (HCC) 09/21/2017   History of skin cancer in adulthood 08/14/2016   Parkinson's disease (HCC) 08/09/2016   Bilateral carotid artery stenosis 04/02/2015   Vertigo, peripheral 10/17/2014   Benign prostatic hyperplasia with urinary frequency 01/31/2014   Obesity 04/03/2013   Other malaise and fatigue 09/21/2012   Hyperlipidemia    Essential hypertension    3-vessel coronary artery disease    S/P CABG x 5     ONSET DATE: 07/03/23  REFERRING DIAG: Y86.578 (ICD-10-CM) - Weakness of both lower extremities   THERAPY DIAG:  Unsteadiness on feet  Difficulty in walking, not elsewhere classified  Other abnormalities of gait and mobility  Abnormality of gait and mobility  Muscle weakness (generalized)  Rationale for Evaluation and Treatment: Rehabilitation  SUBJECTIVE:                                                                                                                                                                                             SUBJECTIVE STATEMENT:  Patient reports he is feeling better today. No falls or LOB. Feels good after finally getting a good night of sleep.   Pt accompanied by: significant other  PERTINENT  HISTORY:  From prior PT evaluation: PD, history of COVID and significant weakness present following this diagnosis and the use of paxlovid ( August 5 dx date, thinks Paxlovid may have exacerbated PD symptoms)    Pt reports having COVID and having significant weakness.  Patient reports he also has back pain that is not present for a long time but was exacerbated more recently.  Patient previously did physical therapy for his back and experienced some relief but has not been consistent with the exercises.  Patient also has history of going to Parkinson's rock steady classes multiple times per week prior to onset of his COVID but he has not been back since. Patient previously ambulated without an assistive device but is now ambulating with a straight point cane.  Patient reports increased foot and ankle weakness in comparison with his hips and knees.  Patient also reports low back pain that is exacerbated with prolonged standing.  PAIN:  Are you having pain? No  PRECAUTIONS: Fall  RED FLAGS: None   WEIGHT BEARING RESTRICTIONS: No  FALLS: Has patient fallen in last 6 months? Yes. Number of falls 6  LIVING ENVIRONMENT: Lives with: lives with their spouse Lives in: House/apartment Stairs: Yes: Internal: 15 steps; on left going up and External: 1 steps; none Has following equipment at home: Quad cane large base and Walker - 2 wheeled  PLOF: Independent  PATIENT GOALS: to improve overall strength, get back to walking more regularly  OBJECTIVE:  Note: Objective measures were completed at Evaluation unless otherwise noted.  DIAGNOSTIC FINDINGS:   EXAM: CT HEAD WITHOUT CONTRAST  IMPRESSION: 1. No acute intracranial process. 2. Mild chronic small vessel ischemic changes. 3. Acute right frontal and maxillary sinusitis.  COGNITION: Overall cognitive status: Within functional limits for tasks assessed   SENSATION: WFL  COORDINATION: WFL    LOWER EXTREMITY ROM:     Active   Right Eval Left Eval  Hip flexion    Hip extension    Hip  abduction    Hip adduction    Hip internal rotation    Hip external rotation    Knee flexion    Knee extension    Ankle dorsiflexion    Ankle plantarflexion    Ankle inversion    Ankle eversion     (Blank rows = not tested)  LOWER EXTREMITY MMT:    MMT Right Eval Left Eval  Hip flexion 4 4  Hip abduction 4 4  Hip adduction 4 4  Knee flexion 3+ 3+  Knee extension 4- 4-  Ankle dorsiflexion 4+ 4  (Blank rows = not tested)  BED MOBILITY:  No limitations according to the pt.  TRANSFERS: Assistive device utilized: None  Sit to stand: Complete Independence Stand to sit: Complete Independence Chair to chair: Complete Independence Floor:  Not tested  FUNCTIONAL TESTS:  5 times sit to stand: 15.03 sec Timed up and go (TUG): 13.38 sec 6 minute walk test: TBD 10 meter walk test: 12.15 sec; 0.82 m/s Dynamic Gait Index: TBD  PATIENT SURVEYS:  ABC scale 70.6%                                                                                                                              TREATMENT DATE: 09/03/23 TE- To improve strength, endurance, mobility, and function of specific targeted muscle groups or improve joint range of motion or improve muscle flexibility  Seated LAQ with 4# AW 3*10    TA- To improve functional movements patterns for everyday tasks   Ambulation with 4WW and 4# AW x 500 ft, good posture, no c/o fatigue, goood foot clearance  NMR:   Octane level 2 x 6 min for B UE and LE reciprocal movement training.  STS PWR! Up 2 x 10 with postural boost with GTB  PWR! Step seated with 4# AW 2 x 10 ea side  1 LE on floor one on step x 30 sec ea  Step taps no UE assist with 4# AW x 15 ea LE  1 LE on step one on floor x 30 sec ea LE   PWR! Up targets postural strengthening and antigravity extension, PRW! Rock targets functional weight shifting, PRW! Twist targets trunk rotation and PRW! Step targets  transition movements. PWR! Moves target bradykinesia, rigidity, and dyskinesia through targeted functional movements that address four core movement difficulties for people with Parkinson's disease.     PATIENT EDUCATION: Education details: Pt educated throughout session about proper posture and technique with exercises. Improved exercise technique, movement at target joints, use of target muscles after min to mod verbal, visual, tactile cues  Person educated: Patient and Spouse Education method: Explanation, Demonstration, and Verbal cues Education comprehension: verbalized understanding  HOME EXERCISE PROGRAM: Seated PWR! Moves x 10 ea, handout provided   GOALS: Goals reviewed with patient? Yes  SHORT TERM GOALS: Target date: 09/14/2023    Pt will be independent with HEP in order to demonstrate increased ability to  perform tasks related to occupation/hobbies. Baseline:  Pt to be given HEP at next visit. Goal status: INITIAL  LONG TERM GOALS: Target date: 11/09/2023  1.  Patient (> 34 years old) will complete five times sit to stand test in < 15 seconds indicating an increased LE strength and improved balance. Baseline: 15.03 sec Goal status: INITIAL  2.  Patient will increase their ABC scale score to be 80.6% (an increase of 10 points), demonstrating improved balance confidence and reduced fall risk, as measured through weekly assessments. Baseline: 70.6% Goal status: INITIAL   3.  Patient will increase Berg Balance score by > 6 points to demonstrate decreased fall risk during functional activities. Baseline: TBD Goal status: INITIAL   4.  Patient will reduce timed up and go to <11 seconds to reduce fall risk and demonstrate improved transfer/gait ability. Baseline: 13.38 sec Goal status: INITIAL  5.  Patient will increase 10 meter walk test to >1.80m/s as to improve gait speed for better community ambulation and to reduce fall risk. Baseline: 12.15 sec; 0.82 m/s Goal  status: INITIAL  6.  Patient will increase six minute walk test distance to >1000 for progression to community ambulator and improve gait ability Baseline: 459 ft with RW stopping at 5:30 due to fatigue  Goal status: INITIAL   ASSESSMENT:  CLINICAL IMPRESSION:  Patient arrived with good motivation for completion of pt activities.  Pt showed continued improvement with ambulatory endurance and fatigue. Pt not nearly as fatigued post session compared to previously. Pt still showing improved tolerance with gait with AD in relation to his low back pain.  Pt will continue to benefit from skilled physical therapy intervention to address impairments, improve QOL, and attain therapy goals.      OBJECTIVE IMPAIRMENTS: Abnormal gait, decreased activity tolerance, decreased balance, decreased endurance, decreased knowledge of use of DME, decreased mobility, difficulty walking, decreased ROM, decreased strength, and decreased safety awareness.   ACTIVITY LIMITATIONS: carrying, lifting, bending, standing, squatting, bathing, and locomotion level  PARTICIPATION LIMITATIONS: cleaning, laundry, driving, shopping, community activity, and yard work  PERSONAL FACTORS: Age, Education, Past/current experiences, Time since onset of injury/illness/exacerbation, and 3+ comorbidities: orthostatic HTN, syncope, falls, anemia, insomnia, dyspnea  are also affecting patient's functional outcome.   REHAB POTENTIAL: Fair pt has been seen in the past.  CLINICAL DECISION MAKING: Evolving/moderate complexity  EVALUATION COMPLEXITY: Moderate  PLAN:  PT FREQUENCY: 2x/week  PT DURATION: 12 weeks  PLANNED INTERVENTIONS: 97110-Therapeutic exercises, 97530- Therapeutic activity, 97112- Neuromuscular re-education, 97535- Self Care, 29528- Manual therapy, [phone]- Gait training, Balance training, Stair training, and Vestibular training  PLAN FOR NEXT SESSION: Balance and strength interventions PD specific interventions as  indicated    Norman Herrlich PT ,DPT Physical Therapist- [facility]  [facility]  09/03/23, 11:09 AM

## 2023-09-07 ENCOUNTER — Ambulatory Visit: Payer: Medicare Other | Admitting: Physical Therapy

## 2023-09-08 ENCOUNTER — Other Ambulatory Visit (INDEPENDENT_AMBULATORY_CARE_PROVIDER_SITE_OTHER)

## 2023-09-08 DIAGNOSIS — N1831 Chronic kidney disease, stage 3a: Secondary | ICD-10-CM

## 2023-09-08 LAB — CBC WITH DIFFERENTIAL/PLATELET
Basophils Absolute: 0 10*3/uL (ref 0.0–0.1)
Basophils Relative: 0.6 % (ref 0.0–3.0)
Eosinophils Absolute: 0 10*3/uL (ref 0.0–0.7)
Eosinophils Relative: 1 % (ref 0.0–5.0)
HCT: 32.2 % — ABNORMAL LOW (ref 39.0–52.0)
Hemoglobin: 10.8 g/dL — ABNORMAL LOW (ref 13.0–17.0)
Lymphocytes Relative: 41.7 % (ref 12.0–46.0)
Lymphs Abs: 1.4 10*3/uL (ref 0.7–4.0)
MCHC: 33.7 g/dL (ref 30.0–36.0)
MCV: 99.3 fl (ref 78.0–100.0)
Monocytes Absolute: 0.1 10*3/uL (ref 0.1–1.0)
Monocytes Relative: 3.6 % (ref 3.0–12.0)
Neutro Abs: 1.8 10*3/uL (ref 1.4–7.7)
Neutrophils Relative %: 53.1 % (ref 43.0–77.0)
Platelets: 149 10*3/uL — ABNORMAL LOW (ref 150.0–400.0)
RBC: 3.24 Mil/uL — ABNORMAL LOW (ref 4.22–5.81)
RDW: 12.9 % (ref 11.5–15.5)
WBC: 3.4 10*3/uL — ABNORMAL LOW (ref 4.0–10.5)

## 2023-09-08 LAB — RENAL FUNCTION PANEL
Albumin: 3.8 g/dL (ref 3.5–5.2)
BUN: 11 mg/dL (ref 6–23)
CO2: 29 meq/L (ref 19–32)
Calcium: 8.7 mg/dL (ref 8.4–10.5)
Chloride: 106 meq/L (ref 96–112)
Creatinine, Ser: 1.26 mg/dL (ref 0.40–1.50)
GFR: 54.57 mL/min — ABNORMAL LOW (ref >60.00)
Glucose, Bld: 159 mg/dL — ABNORMAL HIGH (ref 70–99)
Phosphorus: 3.4 mg/dL (ref 2.3–4.6)
Potassium: 4.2 meq/L (ref 3.5–5.1)
Sodium: 143 meq/L (ref 135–145)

## 2023-09-08 LAB — MAGNESIUM: Magnesium: 1.4 mg/dL — ABNORMAL LOW (ref 1.5–2.5)

## 2023-09-09 LAB — PROTEIN / CREATININE RATIO, URINE
Creatinine, Urine: 134 mg/dL (ref 20–320)
Protein/Creat Ratio: 396 mg/g{creat} — ABNORMAL HIGH (ref 25–148)
Protein/Creatinine Ratio: 0.396 mg/mg{creat} — ABNORMAL HIGH (ref 0.025–0.148)
Total Protein, Urine: 53 mg/dL — ABNORMAL HIGH (ref 5–25)

## 2023-09-10 ENCOUNTER — Ambulatory Visit: Payer: Medicare Other | Attending: Internal Medicine | Admitting: Physical Therapy

## 2023-09-10 DIAGNOSIS — R278 Other lack of coordination: Secondary | ICD-10-CM | POA: Diagnosis not present

## 2023-09-10 DIAGNOSIS — R2689 Other abnormalities of gait and mobility: Secondary | ICD-10-CM | POA: Diagnosis not present

## 2023-09-10 DIAGNOSIS — R29898 Other symptoms and signs involving the musculoskeletal system: Secondary | ICD-10-CM | POA: Diagnosis not present

## 2023-09-10 DIAGNOSIS — M6281 Muscle weakness (generalized): Secondary | ICD-10-CM | POA: Diagnosis not present

## 2023-09-10 DIAGNOSIS — R269 Unspecified abnormalities of gait and mobility: Secondary | ICD-10-CM | POA: Insufficient documentation

## 2023-09-10 DIAGNOSIS — R2681 Unsteadiness on feet: Secondary | ICD-10-CM | POA: Diagnosis not present

## 2023-09-10 DIAGNOSIS — M5459 Other low back pain: Secondary | ICD-10-CM | POA: Diagnosis not present

## 2023-09-10 DIAGNOSIS — R262 Difficulty in walking, not elsewhere classified: Secondary | ICD-10-CM | POA: Insufficient documentation

## 2023-09-10 LAB — PTH, INTACT AND CALCIUM
Calcium: 8.8 mg/dL (ref 8.6–10.3)
PTH: 44 pg/mL (ref 16–77)

## 2023-09-10 NOTE — Therapy (Signed)
 OUTPATIENT PHYSICAL THERAPY NEURO TREATMENT   Patient Name: Johnathan Arnold. MRN: [id number] DOB:1945-08-05, 78 y.o., male Today's Date: 09/10/2023  PCP: [name], MD  REFERRING PROVIDER: [name], MD   END OF SESSION:  PT End of Session - 09/10/23 1300     Visit Number 7    Number of Visits 24    Date for PT Re-Evaluation 11/09/23    Authorization Type Medicare, BCBS supplement    Progress Note Due on Visit 10    PT Start Time 1100    PT Stop Time 1142    PT Time Calculation (min) 42 min    Equipment Utilized During Treatment Gait belt    Activity Tolerance Patient tolerated treatment well    Behavior During Therapy [facility] for tasks assessed/performed                  Past Medical History:  Diagnosis Date   3-vessel coronary artery disease    s/p  5 vessel CABG   Allergy June 2024   Diabetes mellitus without complication (HCC)    History of cardiac catheterization 2011   [facility]   Hyperlipidemia    Hypertension    Hypertriglyceridemia    Neuromuscular disorder [facility]) October 2017   Diagonis Parkinson   Parkinson's disease [facility])    Pneumonia 12/28/2020   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 LIMA to LAD, SVG to D1, OM1 and RPDA   CORONARY ARTERY BYPASS GRAFT  03/1998   5 vessel, [facility]   RIGHT HEART CATH N/A 04/04/2019   Procedure: RIGHT HEART CATH;  Surgeon: Iran Ouch, MD;  Location: [facility] INVASIVE CV LAB;  Service: Cardiovascular;  Laterality: N/A;   RIGHT/LEFT HEART CATH AND CORONARY ANGIOGRAPHY N/A 02/01/2018   Procedure: RIGHT/LEFT HEART CATH AND CORONARY ANGIOGRAPHY;  Surgeon: Iran Ouch, MD;  Location: [facility] INVASIVE CV LAB;  Service: Cardiovascular;  Laterality: N/A;   Patient Active Problem List   Diagnosis Date Noted   Diverticulitis 07/26/2023   RSV (respiratory syncytial virus pneumonia) 07/26/2023    Impaired ambulation 07/02/2023   Orthostatic hypotension 05/22/2023   Generalized weakness 05/07/2021   Bradycardia    Anemia, unspecified 02/07/2021   Neutropenia (HCC) 01/29/2021   Thrombocytopenia (HCC) 01/29/2021   Right lower lobe pneumonia 12/28/2020   Hyponatremia 12/22/2020   Bilateral leg weakness 12/20/2020   Prostate cancer screening 09/08/2020   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) 09/06/2020   Low back pain 08/19/2019   Pulmonary hypertension (HCC)    Insomnia 12/21/2018   Sleep apnea in adult 12/09/2018   Periodic limb movement disorder 12/09/2018   Pulmonary nodules 05/18/2018   Wears hearing aid in both ears 05/17/2018   Leg pain, bilateral 02/20/2018   Dyspnea    CKD stage 3a, GFR 45-59 ml/min (HCC) 09/21/2017   History of skin cancer in adulthood 08/14/2016   Parkinson's disease (HCC) 08/09/2016   Bilateral carotid artery stenosis 04/02/2015   Vertigo, peripheral 10/17/2014   Benign prostatic hyperplasia with urinary frequency 01/31/2014   Obesity 04/03/2013   Other malaise and fatigue 09/21/2012   Hyperlipidemia    Essential hypertension    3-vessel coronary artery disease    S/P CABG x 5     ONSET DATE: 07/03/23  REFERRING DIAG: R51.884 (ICD-10-CM) - Weakness of both lower extremities   THERAPY DIAG:  Unsteadiness on feet  Difficulty in walking, not elsewhere classified  Other abnormalities of gait and mobility  Abnormality of gait and mobility  Muscle weakness (generalized)  Rationale for Evaluation and Treatment: Rehabilitation  SUBJECTIVE:                                                                                                                                                                                             SUBJECTIVE STATEMENT:  Patient reports he is feeling better today. No falls or LOB. Feels better than earlier this week  Pt accompanied by: significant other  PERTINENT HISTORY:  From  prior PT evaluation: PD, history of COVID and significant weakness present following this diagnosis and the use of paxlovid ( August 5 dx date, thinks Paxlovid may have exacerbated PD symptoms)    Pt reports having COVID and having significant weakness.  Patient reports he also has back pain that is not present for a long time but was exacerbated more recently.  Patient previously did physical therapy for his back and experienced some relief but has not been consistent with the exercises.  Patient also has history of going to Parkinson's rock steady classes multiple times per week prior to onset of his COVID but he has not been back since. Patient previously ambulated without an assistive device but is now ambulating with a straight point cane.  Patient reports increased foot and ankle weakness in comparison with his hips and knees.  Patient also reports low back pain that is exacerbated with prolonged standing.  PAIN:  Are you having pain? No  PRECAUTIONS: Fall  RED FLAGS: None   WEIGHT BEARING RESTRICTIONS: No  FALLS: Has patient fallen in last 6 months? Yes. Number of falls 6  LIVING ENVIRONMENT: Lives with: lives with their spouse Lives in: House/apartment Stairs: Yes: Internal: 15 steps; on left going up and External: 1 steps; none Has following equipment at home: Quad cane large base and Walker - 2 wheeled  PLOF: Independent  PATIENT GOALS: to improve overall strength, get back to walking more regularly  OBJECTIVE:  Note: Objective measures were completed at Evaluation unless otherwise noted.  DIAGNOSTIC FINDINGS:   EXAM: CT HEAD WITHOUT CONTRAST  IMPRESSION: 1. No acute intracranial process. 2. Mild chronic small vessel ischemic changes. 3. Acute right frontal and maxillary sinusitis.  COGNITION: Overall cognitive status: Within functional limits for tasks assessed   SENSATION: WFL  COORDINATION: WFL    LOWER EXTREMITY ROM:     Active  Right Eval  Left Eval  Hip flexion    Hip extension    Hip abduction    Hip  adduction    Hip internal rotation    Hip external rotation    Knee flexion    Knee extension    Ankle dorsiflexion    Ankle plantarflexion    Ankle inversion    Ankle eversion     (Blank rows = not tested)  LOWER EXTREMITY MMT:    MMT Right Eval Left Eval  Hip flexion 4 4  Hip abduction 4 4  Hip adduction 4 4  Knee flexion 3+ 3+  Knee extension 4- 4-  Ankle dorsiflexion 4+ 4  (Blank rows = not tested)  BED MOBILITY:  No limitations according to the pt.  TRANSFERS: Assistive device utilized: None  Sit to stand: Complete Independence Stand to sit: Complete Independence Chair to chair: Complete Independence Floor:  Not tested  FUNCTIONAL TESTS:  5 times sit to stand: 15.03 sec Timed up and go (TUG): 13.38 sec 6 minute walk test: TBD 10 meter walk test: 12.15 sec; 0.82 m/s Dynamic Gait Index: TBD  PATIENT SURVEYS:  ABC scale 70.6%                                                                                                                              TREATMENT DATE: 09/10/23 TE- To improve strength, endurance, mobility, and function of specific targeted muscle groups or improve joint range of motion or improve muscle flexibility  Seated LAQ with 4# AW 2*12*3 sec    TA- To improve functional movements patterns for everyday tasks   Ambulation with 4WW and 4# AW x 500 ft, good posture, no c/o fatigue, goood foot clearance  NMR:   Octane level 3 x 6 min for B UE and LE reciprocal movement training.  Sitting PWR! Up 2 x 12 with postural boost with GTB  PWR! Step seated with 4# AW 2 x 10 ea side  Step taps no UE assist with 4# AW 2*10 ea LE  PWR!rock with wall touches to maximize weight shift  PWR! Up targets postural strengthening and antigravity extension, PRW! Rock targets functional weight shifting, PRW! Twist targets trunk rotation and PRW! Step targets transition movements. PWR! Moves  target bradykinesia, rigidity, and dyskinesia through targeted functional movements that address four core movement difficulties for people with Parkinson's disease.     PATIENT EDUCATION: Education details: Pt educated throughout session about proper posture and technique with exercises. Improved exercise technique, movement at target joints, use of target muscles after min to mod verbal, visual, tactile cues  Person educated: Patient and Spouse Education method: Explanation, Demonstration, and Verbal cues Education comprehension: verbalized understanding  HOME EXERCISE PROGRAM: Seated PWR! Moves x 10 ea, handout provided   GOALS: Goals reviewed with patient? Yes  SHORT TERM GOALS: Target date: 09/14/2023    Pt will be independent with HEP in order to demonstrate increased ability to perform tasks related to occupation/hobbies. Baseline:  Pt to be given HEP at next visit. Goal status: INITIAL  LONG TERM GOALS:  Target date: 11/09/2023  1.  Patient (> 48 years old) will complete five times sit to stand test in < 15 seconds indicating an increased LE strength and improved balance. Baseline: 15.03 sec Goal status: INITIAL  2.  Patient will increase their ABC scale score to be 80.6% (an increase of 10 points), demonstrating improved balance confidence and reduced fall risk, as measured through weekly assessments. Baseline: 70.6% Goal status: INITIAL   3.  Patient will increase Berg Balance score by > 6 points to demonstrate decreased fall risk during functional activities. Baseline: TBD Goal status: INITIAL   4.  Patient will reduce timed up and go to <11 seconds to reduce fall risk and demonstrate improved transfer/gait ability. Baseline: 13.38 sec Goal status: INITIAL  5.  Patient will increase 10 meter walk test to >1.81m/s as to improve gait speed for better community ambulation and to reduce fall risk. Baseline: 12.15 sec; 0.82 m/s Goal status: INITIAL  6.  Patient will  increase six minute walk test distance to >1000 for progression to community ambulator and improve gait ability Baseline: 459 ft with RW stopping at 5:30 due to fatigue  Goal status: INITIAL   ASSESSMENT:  CLINICAL IMPRESSION:  Patient arrived with good motivation for completion of pt activities.  Pt showed continued improvement with ambulatory endurance and fatigue.  Pt still showing improved tolerance with gait with AD in relation to his low back pain and fatigue. Continuing to progress with each session with reps, resistance, intensity and overall volume. Pt will continue to benefit from skilled physical therapy intervention to address impairments, improve QOL, and attain therapy goals.      OBJECTIVE IMPAIRMENTS: Abnormal gait, decreased activity tolerance, decreased balance, decreased endurance, decreased knowledge of use of DME, decreased mobility, difficulty walking, decreased ROM, decreased strength, and decreased safety awareness.   ACTIVITY LIMITATIONS: carrying, lifting, bending, standing, squatting, bathing, and locomotion level  PARTICIPATION LIMITATIONS: cleaning, laundry, driving, shopping, community activity, and yard work  PERSONAL FACTORS: Age, Education, Past/current experiences, Time since onset of injury/illness/exacerbation, and 3+ comorbidities: orthostatic HTN, syncope, falls, anemia, insomnia, dyspnea  are also affecting patient's functional outcome.   REHAB POTENTIAL: Fair pt has been seen in the past.  CLINICAL DECISION MAKING: Evolving/moderate complexity  EVALUATION COMPLEXITY: Moderate  PLAN:  PT FREQUENCY: 2x/week  PT DURATION: 12 weeks  PLANNED INTERVENTIONS: 97110-Therapeutic exercises, 97530- Therapeutic activity, 97112- Neuromuscular re-education, 97535- Self Care, 82956- Manual therapy, [phone]- Gait training, Balance training, Stair training, and Vestibular training  PLAN FOR NEXT SESSION: Balance and strength interventions PD specific  interventions as indicated    Norman Herrlich PT ,DPT Physical Therapist- [facility]  [facility]  09/10/23, 1:00 PM

## 2023-09-12 ENCOUNTER — Encounter: Payer: Self-pay | Admitting: Internal Medicine

## 2023-09-14 ENCOUNTER — Ambulatory Visit: Payer: Medicare Other | Admitting: Physical Therapy

## 2023-09-14 ENCOUNTER — Ambulatory Visit: Attending: Physician Assistant | Admitting: Physician Assistant

## 2023-09-14 ENCOUNTER — Encounter: Payer: Self-pay | Admitting: Physician Assistant

## 2023-09-14 VITALS — BP 136/60 | HR 56 | Ht 66.0 in | Wt 158.4 lb

## 2023-09-14 DIAGNOSIS — I10 Essential (primary) hypertension: Secondary | ICD-10-CM | POA: Diagnosis not present

## 2023-09-14 DIAGNOSIS — I2510 Atherosclerotic heart disease of native coronary artery without angina pectoris: Secondary | ICD-10-CM

## 2023-09-14 DIAGNOSIS — R2689 Other abnormalities of gait and mobility: Secondary | ICD-10-CM | POA: Diagnosis not present

## 2023-09-14 DIAGNOSIS — R269 Unspecified abnormalities of gait and mobility: Secondary | ICD-10-CM

## 2023-09-14 DIAGNOSIS — I779 Disorder of arteries and arterioles, unspecified: Secondary | ICD-10-CM

## 2023-09-14 DIAGNOSIS — R262 Difficulty in walking, not elsewhere classified: Secondary | ICD-10-CM | POA: Diagnosis not present

## 2023-09-14 DIAGNOSIS — M6281 Muscle weakness (generalized): Secondary | ICD-10-CM | POA: Diagnosis not present

## 2023-09-14 DIAGNOSIS — R2681 Unsteadiness on feet: Secondary | ICD-10-CM

## 2023-09-14 DIAGNOSIS — I272 Pulmonary hypertension, unspecified: Secondary | ICD-10-CM

## 2023-09-14 DIAGNOSIS — E785 Hyperlipidemia, unspecified: Secondary | ICD-10-CM

## 2023-09-14 DIAGNOSIS — R278 Other lack of coordination: Secondary | ICD-10-CM | POA: Diagnosis not present

## 2023-09-14 MED ORDER — LOSARTAN POTASSIUM 50 MG PO TABS: 50.0000 mg | ORAL_TABLET | Freq: Two times a day (BID) | ORAL | 3 refills | Status: DC

## 2023-09-14 NOTE — Therapy (Signed)
 OUTPATIENT PHYSICAL THERAPY NEURO TREATMENT   Patient Name: Johnathan Arnold. MRN: [id number] DOB:1946/03/13, 78 y.o., male Today's Date: 09/14/2023  PCP: [name], MD  REFERRING PROVIDER: [name], MD   END OF SESSION:  PT End of Session - 09/14/23 1205     Visit Number 8    Number of Visits 24    Date for PT Re-Evaluation 11/09/23    Authorization Type Medicare, BCBS supplement    Progress Note Due on Visit 10    PT Start Time 1146    PT Stop Time 1225    PT Time Calculation (min) 39 min    Equipment Utilized During Treatment Gait belt    Activity Tolerance Patient tolerated treatment well    Behavior During Therapy [facility] for tasks assessed/performed                   Past Medical History:  Diagnosis Date   3-vessel coronary artery disease    s/p  5 vessel CABG   Allergy June 2024   Diabetes mellitus without complication (HCC)    History of cardiac catheterization 2011   [facility]   Hyperlipidemia    Hypertension    Hypertriglyceridemia    Neuromuscular disorder [facility]) October 2017   Diagonis Parkinson   Parkinson's disease [facility])    Pneumonia 12/28/2020   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 LIMA to LAD, SVG to D1, OM1 and RPDA   CORONARY ARTERY BYPASS GRAFT  03/1998   5 vessel, [facility]   RIGHT HEART CATH N/A 04/04/2019   Procedure: RIGHT HEART CATH;  Surgeon: Iran Ouch, MD;  Location: [facility] INVASIVE CV LAB;  Service: Cardiovascular;  Laterality: N/A;   RIGHT/LEFT HEART CATH AND CORONARY ANGIOGRAPHY N/A 02/01/2018   Procedure: RIGHT/LEFT HEART CATH AND CORONARY ANGIOGRAPHY;  Surgeon: Iran Ouch, MD;  Location: [facility] INVASIVE CV LAB;  Service: Cardiovascular;  Laterality: N/A;   Patient Active Problem List   Diagnosis Date Noted   Diverticulitis 07/26/2023   RSV (respiratory syncytial virus pneumonia) 07/26/2023    Impaired ambulation 07/02/2023   Orthostatic hypotension 05/22/2023   Generalized weakness 05/07/2021   Bradycardia    Anemia, unspecified 02/07/2021   Neutropenia (HCC) 01/29/2021   Thrombocytopenia (HCC) 01/29/2021   Right lower lobe pneumonia 12/28/2020   Hyponatremia 12/22/2020   Bilateral leg weakness 12/20/2020   Prostate cancer screening 09/08/2020   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) 09/06/2020   Low back pain 08/19/2019   Pulmonary hypertension (HCC)    Insomnia 12/21/2018   Sleep apnea in adult 12/09/2018   Periodic limb movement disorder 12/09/2018   Pulmonary nodules 05/18/2018   Wears hearing aid in both ears 05/17/2018   Leg pain, bilateral 02/20/2018   Dyspnea    CKD stage 3a, GFR 45-59 ml/min (HCC) 09/21/2017   History of skin cancer in adulthood 08/14/2016   Parkinson's disease (HCC) 08/09/2016   Bilateral carotid artery stenosis 04/02/2015   Vertigo, peripheral 10/17/2014   Benign prostatic hyperplasia with urinary frequency 01/31/2014   Obesity 04/03/2013   Other malaise and fatigue 09/21/2012   Hyperlipidemia    Essential hypertension    3-vessel coronary artery disease    S/P CABG x 5     ONSET DATE: 07/03/23  REFERRING DIAG: Y86.578 (ICD-10-CM) - Weakness of both lower extremities   THERAPY  DIAG:  Unsteadiness on feet  Difficulty in walking, not elsewhere classified  Other abnormalities of gait and mobility  Abnormality of gait and mobility  Muscle weakness (generalized)  Rationale for Evaluation and Treatment: Rehabilitation  SUBJECTIVE:                                                                                                                                                                                             SUBJECTIVE STATEMENT:  Patient reports no changes. No falls or LOB. Has been doing HEP somewhat regularly   Pt accompanied by: significant other  PERTINENT HISTORY:  From prior PT  evaluation: PD, history of COVID and significant weakness present following this diagnosis and the use of paxlovid ( August 5 dx date, thinks Paxlovid may have exacerbated PD symptoms)    Pt reports having COVID and having significant weakness.  Patient reports he also has back pain that is not present for a long time but was exacerbated more recently.  Patient previously did physical therapy for his back and experienced some relief but has not been consistent with the exercises.  Patient also has history of going to Parkinson's rock steady classes multiple times per week prior to onset of his COVID but he has not been back since. Patient previously ambulated without an assistive device but is now ambulating with a straight point cane.  Patient reports increased foot and ankle weakness in comparison with his hips and knees.  Patient also reports low back pain that is exacerbated with prolonged standing.  PAIN:  Are you having pain? No  PRECAUTIONS: Fall  RED FLAGS: None   WEIGHT BEARING RESTRICTIONS: No  FALLS: Has patient fallen in last 6 months? Yes. Number of falls 6  LIVING ENVIRONMENT: Lives with: lives with their spouse Lives in: House/apartment Stairs: Yes: Internal: 15 steps; on left going up and External: 1 steps; none Has following equipment at home: Quad cane large base and Walker - 2 wheeled  PLOF: Independent  PATIENT GOALS: to improve overall strength, get back to walking more regularly  OBJECTIVE:  Note: Objective measures were completed at Evaluation unless otherwise noted.  DIAGNOSTIC FINDINGS:   EXAM: CT HEAD WITHOUT CONTRAST  IMPRESSION: 1. No acute intracranial process. 2. Mild chronic small vessel ischemic changes. 3. Acute right frontal and maxillary sinusitis.  COGNITION: Overall cognitive status: Within functional limits for tasks assessed   SENSATION: WFL  COORDINATION: WFL    LOWER EXTREMITY ROM:     Active  Right Eval Left Eval  Hip  flexion    Hip extension    Hip abduction    Hip  adduction    Hip internal rotation    Hip external rotation    Knee flexion    Knee extension    Ankle dorsiflexion    Ankle plantarflexion    Ankle inversion    Ankle eversion     (Blank rows = not tested)  LOWER EXTREMITY MMT:    MMT Right Eval Left Eval  Hip flexion 4 4  Hip abduction 4 4  Hip adduction 4 4  Knee flexion 3+ 3+  Knee extension 4- 4-  Ankle dorsiflexion 4+ 4  (Blank rows = not tested)  BED MOBILITY:  No limitations according to the pt.  TRANSFERS: Assistive device utilized: None  Sit to stand: Complete Independence Stand to sit: Complete Independence Chair to chair: Complete Independence Floor:  Not tested  FUNCTIONAL TESTS:  5 times sit to stand: 15.03 sec Timed up and go (TUG): 13.38 sec 6 minute walk test: TBD 10 meter walk test: 12.15 sec; 0.82 m/s Dynamic Gait Index: TBD  PATIENT SURVEYS:  ABC scale 70.6%                                                                                                                              TREATMENT DATE: 09/14/23 TE- To improve strength, endurance, mobility, and function of specific targeted muscle groups or improve joint range of motion or improve muscle flexibility  Seated LAQ with 4# AW 2*12*3 sec    TA- To improve functional movements patterns for everyday tasks   Ambulation with 4WW and 4# AW x 900 ft, good posture, no c/o fatigue, goood foot clearance  NMR:   Octane level 4 x 6 min for B UE and LE reciprocal movement training.  Sitting PWR! Up 2 x 12 with postural boost with GTB  PWR! Step seated with 4# AW 2 x 10 ea side  Seated PWR! Rock with 1# wrist weight donned Seated PWR twist with 1# wrist weight donned  Standing PWR! Step x 10 ea  Forward walk at high speed then retro walk x 30 ft x 3 rounds   PWR! Up targets postural strengthening and antigravity extension, PRW! Rock targets functional weight shifting, PRW! Twist targets  trunk rotation and PRW! Step targets transition movements. PWR! Moves target bradykinesia, rigidity, and dyskinesia through targeted functional movements that address four core movement difficulties for people with Parkinson's disease.     PATIENT EDUCATION: Education details: Pt educated throughout session about proper posture and technique with exercises. Improved exercise technique, movement at target joints, use of target muscles after min to mod verbal, visual, tactile cues  Person educated: Patient and Spouse Education method: Explanation, Demonstration, and Verbal cues Education comprehension: verbalized understanding  HOME EXERCISE PROGRAM: Seated PWR! Moves x 10 ea, handout provided   GOALS: Goals reviewed with patient? Yes  SHORT TERM GOALS: Target date: 09/14/2023    Pt will be independent with HEP in order to demonstrate increased ability to perform tasks related  to occupation/hobbies. Baseline:  Pt to be given HEP at next visit. Goal status: INITIAL  LONG TERM GOALS: Target date: 11/09/2023  1.  Patient (> 71 years old) will complete five times sit to stand test in < 15 seconds indicating an increased LE strength and improved balance. Baseline: 15.03 sec Goal status: INITIAL  2.  Patient will increase their ABC scale score to be 80.6% (an increase of 10 points), demonstrating improved balance confidence and reduced fall risk, as measured through weekly assessments. Baseline: 70.6% Goal status: INITIAL   3.  Patient will increase Berg Balance score by > 6 points to demonstrate decreased fall risk during functional activities. Baseline: TBD Goal status: INITIAL   4.  Patient will reduce timed up and go to <11 seconds to reduce fall risk and demonstrate improved transfer/gait ability. Baseline: 13.38 sec Goal status: INITIAL  5.  Patient will increase 10 meter walk test to >1.62m/s as to improve gait speed for better community ambulation and to reduce fall  risk. Baseline: 12.15 sec; 0.82 m/s Goal status: INITIAL  6.  Patient will increase six minute walk test distance to >1000 for progression to community ambulator and improve gait ability Baseline: 459 ft with RW stopping at 5:30 due to fatigue  Goal status: INITIAL   ASSESSMENT:  CLINICAL IMPRESSION:  Patient arrived with good motivation for completion of pt activities.  Pt showed continued improvement with ambulatory endurance and fatigue.  Pt still showing improved tolerance with gait with AD in relation to his low back pain and fatigue. Continuing to progress with each session with reps, resistance, intensity and overall volume. Encouraged pt of progress with ambulatory activities and balance. Pt will continue to benefit from skilled physical therapy intervention to address impairments, improve QOL, and attain therapy goals.      OBJECTIVE IMPAIRMENTS: Abnormal gait, decreased activity tolerance, decreased balance, decreased endurance, decreased knowledge of use of DME, decreased mobility, difficulty walking, decreased ROM, decreased strength, and decreased safety awareness.   ACTIVITY LIMITATIONS: carrying, lifting, bending, standing, squatting, bathing, and locomotion level  PARTICIPATION LIMITATIONS: cleaning, laundry, driving, shopping, community activity, and yard work  PERSONAL FACTORS: Age, Education, Past/current experiences, Time since onset of injury/illness/exacerbation, and 3+ comorbidities: orthostatic HTN, syncope, falls, anemia, insomnia, dyspnea  are also affecting patient's functional outcome.   REHAB POTENTIAL: Fair pt has been seen in the past.  CLINICAL DECISION MAKING: Evolving/moderate complexity  EVALUATION COMPLEXITY: Moderate  PLAN:  PT FREQUENCY: 2x/week  PT DURATION: 12 weeks  PLANNED INTERVENTIONS: 97110-Therapeutic exercises, 97530- Therapeutic activity, 97112- Neuromuscular re-education, 97535- Self Care, 69629- Manual therapy, [phone]- Gait  training, Balance training, Stair training, and Vestibular training  PLAN FOR NEXT SESSION: Balance and strength interventions PD specific interventions as indicated    Norman Herrlich PT ,DPT Physical Therapist- [facility]  09/14/23, 12:38 PM

## 2023-09-14 NOTE — Progress Notes (Signed)
 Cardiology Office Note    Date:  09/14/2023   ID:  Johnathan Callegari., DOB 11-12-1945, MRN [id number]  PCP:  [name], MD  Cardiologist:  Lorine Bears, MD  Electrophysiologist:  None   Chief Complaint: Follow up  History of Present Illness:   Johnathan Schimming. is a 78 y.o. 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  R/LHC in 01/2018 showed occluded native arteries with patent grafts including LIMA to LAD, SVG to diagonal, SVG to OM, and SVG to distal RCA.  RHC showed normal filling pressures, mild pulmonary hypertension, and normal cardiac output.  Previous carotid Doppler showed mild nonobstructive bilateral disease.  He has a history of chronic bilateral exertional leg pain with normal lower extremity arterial Doppler in 2018.  In the setting of moderate to severe pulmonary hypertension, prior VQ scan was low probability for PE.  RHC in 03/2019 showed moderate pulmonary hypertension at 54 over 13 mmHg with a wedge pressure of 17 mmHg, and a PVR 2.22.  Pulmonary hypertension was felt to be of a mixed etiology.  He was admitted to the hospital in 05/2021 with a fall and generalized weakness, felt to be mostly polypharmacy-induced, including benzodiazepines and antihypertensive medications.  MRI of the brain was nonacute.  CT of the chest was negative for PE.  During the admission, he was noted to be bradycardic in the 40s bpm leading carvedilol to be discontinued.   Zio patch in 02/2022 showed a predominant rhythm of sinus with an average rate of 58 bpm with a range of 45 to 176 bpm, 7 episodes of SVT lasting up to 13.8 seconds, and rare PACs and PVCs.  He was last seen in the office in 02/2023 noting COVID infection in 01/2021 for with associated significant weakness.   He was without symptoms of angina or cardiac decompensation.  Carotid artery ultrasound in 03/2023 showed 1 to 39% bilateral ICA stenosis.  Since we last saw him in the office he has been admitted to the hospital several times.  He was admitted in 06/2023 after presenting with generalized weakness and was treated for UTI and underlying Parkinson's.  Losartan was held.  He was admitted in 07/2023 after presenting with generalized weakness and found to have RSV, right lower lobe pneumonia, and diverticulitis.  He was most recently admitted to the hospital in 07/2023, again presenting with generalized weakness and fatigue with recommendation to hold PTA clonazepam.  At time of discharge, ranolazine was held (discharge summary indicates patient was not taking medication).  He comes in accompanied by his wife and is doing well from a cardiac perspective, without symptoms of angina or cardiac decompensation.  No dizziness, presyncope, or syncope.  No falls or symptoms concerning for bleeding.  Now back on losartan 50 mg in the morning with the discontinuation of hydralazine.  Remains off ranolazine.  Blood pressure in the morning prior to taking medications is greater than 200 mmHg systolic at times.  Remains adherent to CPAP.   Labs independently reviewed: 09/2023 - Hgb 10.8, PLT 149, magnesium 1.4, potassium 4.2, BUN 11, serum creatinine 1.26, albumin 3.8 07/2023 - AST/ALT normal 09/2022 - TSH normal, A1c 5.9, direct LDL 32, TC 113, TG 86, HDL 60, LDL 36  Past Medical History:  Diagnosis Date  3-vessel coronary artery disease    s/p  5 vessel CABG   Allergy June 2024   Diabetes mellitus without complication (HCC)    History of cardiac catheterization 2011   [facility]   Hyperlipidemia    Hypertension    Hypertriglyceridemia    Neuromuscular disorder [facility]) October 2017   Diagonis Parkinson   Parkinson's disease [facility])    Pneumonia 12/28/2020   S/P CABG x 5 04/1998   Vertigo     Past Surgical History:   Procedure Laterality Date   CARDIAC CATHETERIZATION  05-19-2010   [facility]: Patent grafts. LIMA to LAD, SVG to D1, OM1 and RPDA   CORONARY ARTERY BYPASS GRAFT  03/1998   5 vessel, [facility]   RIGHT HEART CATH N/A 04/04/2019   Procedure: RIGHT HEART CATH;  Surgeon: Iran Ouch, MD;  Location: [facility] INVASIVE CV LAB;  Service: Cardiovascular;  Laterality: N/A;   RIGHT/LEFT HEART CATH AND CORONARY ANGIOGRAPHY N/A 02/01/2018   Procedure: RIGHT/LEFT HEART CATH AND CORONARY ANGIOGRAPHY;  Surgeon: Iran Ouch, MD;  Location: [facility] INVASIVE CV LAB;  Service: Cardiovascular;  Laterality: N/A;    Current Medications: Current Meds  Medication Sig   acetaminophen (TYLENOL) 500 MG tablet Take 500 mg by mouth as needed.   aspirin EC 81 MG tablet Take 1 tablet (81 mg total) by mouth at bedtime.   carbidopa-levodopa (SINEMET IR) 25-100 MG tablet TAKE 2 TABLETS AT 8:00AM , 2 TABLETS AT 11:00AM, 2 TABLETS AT 2:00PM AND 1 TABLET AT 5:00PM   furosemide (LASIX) 20 MG tablet Take 1 tablet (20 mg total) by mouth daily as needed (for swelling).   hydrALAZINE (APRESOLINE) 100 MG tablet Take 1 tablet (100 mg total) by mouth 3 (three) times daily.   hydrocortisone cream 1 % Apply topically 2 (two) times daily.   isosorbide mononitrate (IMDUR) 60 MG 24 hr tablet TAKE 1 TABLET BY MOUTH DAILY   melatonin 3 MG TABS tablet Take 1.5 mg by mouth at bedtime as needed.   Multiple Vitamin (MULTIVITAMIN) tablet Take 1 tablet by mouth daily.   nitroGLYCERIN (NITROSTAT) 0.4 MG SL tablet Place 1 tablet (0.4 mg total) under the tongue every 5 (five) minutes as needed for chest pain.   omeprazole (PRILOSEC) 20 MG capsule Take 1 capsule (20 mg total) by mouth every morning.   ondansetron (ZOFRAN) 4 MG tablet Take 1 tablet (4 mg total) by mouth every 6 (six) hours as needed for nausea or vomiting.   polyethylene glycol (MIRALAX / GLYCOLAX) packet Take 17 g by mouth daily as needed.   rosuvastatin (CRESTOR) 10 MG tablet TAKE  1 TABLET BY MOUTH DAILY   tamsulosin (FLOMAX) 0.4 MG CAPS capsule Take 1 capsule (0.4 mg total) by mouth daily.   traZODone (DESYREL) 50 MG tablet Take 0.5-1 tablets (25-50 mg total) by mouth at bedtime as needed for sleep.   [DISCONTINUED] losartan (COZAAR) 50 MG tablet Take 50 mg by mouth in the morning and at bedtime.    Allergies:   Paxlovid [nirmatrelvir-ritonavir]   Social History   Socioeconomic History   Marital status: Married    Spouse name: Not on file   Number of children: 2   Years of education: Not on file   Highest education level: Master's degree (e.g., MA, MS, MEng, MEd, MSW, MBA)  Occupational History   Occupation: retired    Comment: IT work  Tobacco Use   Smoking status: Never    Passive exposure: Yes   Smokeless tobacco: Never  Vaping Use   Vaping status: Never Used  Substance and Sexual Activity   Alcohol use: Yes    Comment: occasional beer   Drug use: No   Sexual activity: Not Currently  Other Topics Concern   Not on file  Social History Narrative   Right Handed    Lives in a two story home    Social Drivers of Health   Financial Resource Strain: Low Risk  (05/19/2023)   Overall Financial Resource Strain (CARDIA)    Difficulty of Paying Living Expenses: Not hard at all  Food Insecurity: No Food Insecurity (08/05/2023)   Hunger Vital Sign    Worried About Running Out of Food in the Last Year: Never true    Ran Out of Food in the Last Year: Never true  Transportation Needs: No Transportation Needs (08/06/2023)   PRAPARE - Administrator, Civil Service (Medical): No    Lack of Transportation (Non-Medical): No  Physical Activity: Sufficiently Active (05/19/2023)   Exercise Vital Sign    Days of Exercise per Week: 3 days    Minutes of Exercise per Session: 60 min  Stress: No Stress Concern Present (05/19/2023)   [employer] of Occupational Health - Occupational Stress Questionnaire    Feeling of Stress : Only a little  Social  Connections: Socially Isolated (07/27/2023)   Social Connection and Isolation Panel [NHANES]    Frequency of Communication with Friends and Family: Never    Frequency of Social Gatherings with Friends and Family: Never    Attends Religious Services: Never    Database administrator or Organizations: No    Attends Engineer, structural: Patient declined    Marital Status: Married     Family History:  The patient's family history includes Healthy in his son; Heart attack (age of onset: 37) in his father; Heart attack (age of onset: 51) in his mother; Heart disease in his brother, brother, father, and mother; Hypertension in his mother.  ROS:   12-point review of systems is negative unless otherwise noted in the HPI.   EKGs/Labs/Other Studies Reviewed:    Studies reviewed were summarized above. The additional studies were reviewed today:  Carotid artery ultrasound 03/20/2023: Summary:  Right Carotid: Velocities in the right ICA are consistent with a 1-39%  stenosis.   Left Carotid: Velocities in the left ICA are consistent with a 1-39%  stenosis.  The ECA appears >50% stenosed.   Vertebrals:  Bilateral vertebral arteries demonstrate antegrade flow.  Subclavians: Normal flow hemodynamics were seen in bilateral subclavian arteries.  __________  Luci Bank patch 02/2022: Patient had a min HR of 45 bpm, max HR of 176 bpm, and avg HR of 58 bpm. Predominant underlying rhythm was Sinus Rhythm.  7 Supraventricular Tachycardia runs occurred, the run with the fastest interval lasting 4 beats with a max rate of 176 bpm, the  longest lasting 13.8 secs with an avg rate of 110 bpm.  Rare PACs and rare PVCs. __________  Carotid artery ultrasound 05/07/2021: IMPRESSION: 1. No hemodynamically significant stenosis or occlusive disease within either internal carotid artery. 2. Estimated 50% or greater stenosis at the origin/proximal ECA on the right. ___________  2D echo 01/31/2021:  1. Left  ventricular ejection fraction, by estimation, is 60 to 65%. The  left ventricle has normal function. The left ventricle has no regional  wall motion abnormalities. Left ventricular diastolic parameters are  consistent with Grade II diastolic  dysfunction (pseudonormalization).   2. Right ventricular systolic  function is low normal. The right  ventricular size is normal. There is moderately elevated pulmonary artery  systolic pressure. The estimated right ventricular systolic pressure is  57.0 mmHg.   3. Left atrial size was mildly dilated.   4. The mitral valve is normal in structure. Mild mitral valve  regurgitation.   5. The aortic valve is tricuspid. Aortic valve regurgitation is not  visualized.   6. The inferior vena cava is dilated in size with >50% respiratory  variability, suggesting right atrial pressure of 8 mmHg. __________   Aortic and iliac artery ultrasound 11/15/2020: Summary:  Abdominal Aorta:  No evidence of an abdominal aortic aneurysm was visualized.  The largest aortic measurement is 1.9 cm.   Stenosis:  Brisk bi/triphasic flow in the common and external iliac arteries, without  significant plaque or stenosis identified. __________   ABIs 11/15/2020: ABI/TBIToday's ABIToday's TBIPrevious ABIPrevious TBI  +-------+-----------+-----------+------------+------------+  Right  1.15       1.00       1.25        1.16          +-------+-----------+-----------+------------+------------+  Left   1.13       0.87       1.29        1.08          +-------+-----------+-----------+------------+------------+   Bilateral ABIs appear essentially unchanged compared to prior study on  02/18/17.     Summary:  Right: Resting right ankle-brachial index is within normal range.  No evidence of significant right lower extremity arterial disease. The  right toe-brachial index is normal.   Left: Resting left ankle-brachial index is within normal range.  No evidence of  significant left lower extremity arterial disease. The left  toe-brachial index is normal. __________   RHC 04/04/2019: Successful right heart catheterization via the right brachial vein. RA pressure: 10/15 with a mean of 13 mmHg. RV: 56/4 with an end-diastolic pressure of 16 mmHg PA: 54/13 with a mean of 29 mmHg.  Pulmonary vascular resistance is 2.22 Woods units Pulmonary capillary wedge pressure is 17 mmHg with prominent V waves. Cardiac output is 5.4 L/min with a cardiac index of 2.79.   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  He does have prominent V waves suggestive of significant mitral regurgitation.  I did review his recent echocardiogram done in September and his moderate regurgitation was moderate at most. His volume status appears to be reasonable and I am hesitant to diurese him further given underlying chronic kidney disease. __________   2D echo 02/23/2019: 1. The left ventricle has normal systolic function with an ejection  fraction of 60-65%. The cavity size was normal. Left ventricular diastolic  Doppler parameters are consistent with pseudonormalization.   2. The right ventricle has normal systolic function. The cavity was  moderately enlarged. There is Right vetricular wall thickness was not  assessed. Right ventricular systolic pressure is severely elevated with an  estimated pressure of 61 mmHg.   3. Left atrial size was mild-moderately dilated.   4. Right atrial size was mildly dilated.   5. No evidence of mitral valve stenosis.   6. Pulmonic valve regurgitation is mild by color flow Doppler.   7. The aorta is normal unless otherwise noted.   8. The inferior vena cava was is dilated in size with <50% respiratory  variability, suggesting right atrial pressure  of 15 mmHg. __________   [facility] 02/01/2018: Prox Cx lesion is  100% stenosed. Prox LAD lesion is 100% stenosed. Prox RCA to Mid RCA lesion is 90% stenosed. SVG and is normal in caliber. The graft exhibits no disease. SVG and is normal in caliber. The graft exhibits no disease. Ost 2nd Diag lesion is 100% stenosed. SVG and is normal in caliber. The graft exhibits no disease. Dist RCA lesion is 100% stenosed. LIMA graft was visualized by angiography and is normal in caliber. The graft exhibits no disease. Mid LAD lesion is 60% stenosed. Ost RPDA lesion is 60% stenosed.   1.  Occluded native coronary arteries with patent grafts including LIMA to LAD, SVG to diagonal, SVG to OM and SVG to distal RCA.  No significant graft disease.  Suspect proximal artery ischemia given occlusion of native vessels. 2.  Left ventricular angiography was not performed due to chronic kidney disease. 3.  Right heart catheterization showed normal filling pressures, mild pulmonary hypertension and normal cardiac output.  Mean PA pressure was 23 mmHg, pulmonary capillary wedge pressure was 9 mmHg and cardiac output was 7.7 L/min.   Recommendations: Continue medical therapy. __________   2D echo 03/19/2017: - Left ventricle: The cavity size was normal. There was mild    concentric hypertrophy. Systolic function was normal. The    estimated ejection fraction was in the range of 60% to 65%. Wall    motion was normal; there were no regional wall motion    abnormalities. Left ventricular diastolic function parameters    were normal.  - Mitral valve: There was mild regurgitation.  - Left atrium: The atrium was mildly dilated.  - Pulmonary arteries: Systolic pressure was mildly increased. PA    peak pressure: 45 mm Hg (S). __________   [facility] 03/17/2017: There is a small in size, mild in severity, reversible defect involving the mid anteroseptal segment consistent with mild ischemia. There is a small in size, severe, fixed defect at the apex that most likely represents  artifact (attenuation and apical thinning) and less likely scar. The left ventricular ejection fraction is normal (60%). Patient was unable to reach target heart rate with exercise; the study was converted to Lexiscan. Upsloping ST segment depression ST segment depression of 1 mm was noted during stress in the I, II, V5 and V6 leads after administration of Lexiscan. __________   See Epic for remaining images.  EKG:  EKG is not ordered today.    Recent Labs: 09/17/2022: TSH 2.04 08/06/2023: ALT 17 09/08/2023: BUN 11; Creatinine, Ser 1.26; Hemoglobin 10.8; Magnesium 1.4; Platelets 149.0; Potassium 4.2; Sodium 143  Recent Lipid Panel    Component Value Date/Time   CHOL 113 09/17/2022 1003   TRIG 86.0 09/17/2022 1003   HDL 60.10 09/17/2022 1003   CHOLHDL 2 09/17/2022 1003   VLDL 17.2 09/17/2022 1003   LDLCALC 36 09/17/2022 1003   LDLDIRECT 32.0 09/17/2022 1003    PHYSICAL EXAM:    VS:  BP 136/60   Pulse (!) 56   Ht 5\' 6"  (1.676 m)   Wt 158 lb 6.4 oz (71.8 kg)   SpO2 97%   BMI 25.57 kg/m   BMI: Body mass index is 25.57 kg/m.  Physical Exam Vitals reviewed.  Constitutional:      Appearance: He is well-developed.  HENT:     Head: Normocephalic and atraumatic.  Eyes:     General:        Right eye: No discharge.  Left eye: No discharge.  Cardiovascular:     Rate and Rhythm: Normal rate and regular rhythm.     Heart sounds: S1 normal and S2 normal. Heart sounds not distant. No midsystolic click and no opening snap. Murmur heard.     Systolic murmur is present with a grade of 2/6 at the upper right sternal border.     No friction rub.  Pulmonary:     Effort: Pulmonary effort is normal. No respiratory distress.     Breath sounds: Normal breath sounds. No decreased breath sounds, wheezing, rhonchi or rales.  Chest:     Chest wall: No tenderness.  Musculoskeletal:     Cervical back: Normal range of motion.  Skin:    General: Skin is warm and dry.     Nails: There is  no clubbing.  Neurological:     Mental Status: He is alert and oriented to person, place, and time.     Motor: Tremor present.  Psychiatric:        Speech: Speech normal.        Behavior: Behavior normal.        Thought Content: Thought content normal.        Judgment: Judgment normal.     Wt Readings from Last 3 Encounters:  09/14/23 158 lb 6.4 oz (71.8 kg)  09/01/23 154 lb (69.9 kg)  08/04/23 175 lb (79.4 kg)     ASSESSMENT & PLAN:   CAD status post CABG without angina: He is doing well and without symptoms concerning for angina despite being off ranolazine.  Remains on aspirin and Imdur.  No indication for further ischemic testing at this time.  Pulmonary hypertension: Asymptomatic.  Not needing as needed furosemide.  Carotid artery disease: Ultrasound in 03/2023 showed 1 to 39% bilateral ICA stenosis with antegrade flow of the bilateral vertebral arteries and normal flow hemodynamics in the bilateral subclavian arteries.  Follow-up imaging in 03/2024.  Remains on aspirin and rosuvastatin.  HTN: Blood pressure is well-controlled in the office today.  He reports BP readings in the 200s systolic at times in the mornings prior to taking a.m. medications.  In this setting we will add 50 mg of losartan in the evening with continuation of 50 mg losartan in the morning along with Imdur 60 mg.  HLD: LDL 36 in 09/2022 with normal AST/ALT in 07/2023.  He remains on rosuvastatin 10 mg.   Disposition: F/u with Dr. Kirke Corin or an APP in 3 months.   Medication Adjustments/Labs and Tests Ordered: Current medicines are reviewed at length with the patient today.  Concerns regarding medicines are outlined above. Medication changes, Labs and Tests ordered today are summarized above and listed in the Patient Instructions accessible in Encounters.   Signed, Eula Listen, PA-C 09/14/2023 5:03 PM     [facility] [place] [address] [place], [place] [address] [phone]

## 2023-09-14 NOTE — Patient Instructions (Signed)
 Medication Instructions:  Your physician recommends the following medication changes.  INCREASE: Losartan 50 twice daily (please a 50 mg dose in the evening)   *If you need a refill on your cardiac medications before your next appointment, please call your pharmacy*  Lab Work: None ordered at this time   Follow-Up: At [facility], you and your health needs are our priority.  As part of our continuing mission to provide you with exceptional heart care, our providers are all part of one team.  This team includes your primary Cardiologist (physician) and Advanced Practice Providers or APPs (Physician Assistants and Nurse Practitioners) who all work together to provide you with the care you need, when you need it.  Your next appointment:   3 month(s)  Provider:   You may see Lorine Bears, MD or Eula Listen, PA-C

## 2023-09-14 NOTE — Progress Notes (Unsigned)
 Assessment/Plan:   1.  Parkinsons Disease, with worsening of symptoms when he has had systemic illness  -Patient's Parkinson's really looks adequately controlled today.  -Continue carbidopa/levodopa 25/100 to 2 at 8am, 2 at 11am, 2 at 2 pm, 1 at 5pm  -Continue carbidopa/levodopa 50/200 CR at bedtime.  -use walker at all times.  -Long discussion with the patient today regarding Parkinson's disease and his response to illness.  Patient often will have worsening of symptoms of Parkinson's disease as he develops other illnesses.  However, we discussed that this is not the Parkinson's disease itself getting worse, but rather its reaction to other illnesses.  He always gets better as the other illnesses get better.  Today, he primarily just looks deconditioned.  I am happy to see that he is back in physical therapy.  -We discussed that it used to be thought that levodopa would increase risk of melanoma but now it is believed that Parkinsons itself likely increases risk of melanoma. he is to get regular skin checks.  He is doing this.  He sees Dr. Adolphus Birchwood  2.  RLS/RBD  - His clonazepam was stopped in the hospital when he was hospitalized for 1 episode of confusion.  He thinks the confusion was due to taking Tussionex after a prior hospitalization.  Nonetheless, I did not restart it right now.  -he has bed rails now and uses them  -melatonin 3 mg at bedtime.  3.  Sleep apnea  -On CPAP, but recently has not been wearing  4.  Pulmonary hypertension  -Follows with cardiology and pulmonary  5  MCI  -pt with neurocog testing in Jan, 2022 and demonstrated only MCI.  No evidence of a mood disorder.   6.  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  -Patient with syncope/near syncope in primary care office December 13.  Patient had been sick with vomiting and diarrheal illness and stood up  to go to the lab and had the episode.  This was likely due to dehydration associated with orthostasis.  7.  Carotid bruit, left  -u/s on 10/11 was normal  8.  LBP with hyperreflexia  -do MRI lumbar spine  9.  Nocturia  -Follows with urology  - This is a source of insomnia  - Latest urology visit felt that not wearing the CPAP at night was contributing Subjective:   Johnathan Arnold. was seen today in follow up for Parkinsons disease.  My previous records were reviewed prior to todays visit as well as outside records available to me. pts wife and son with patient and supplements the hx. patient worked in today.  Patient last seen in January.  Unfortunately, he has had multiple hospitalizations since that time.  Records reviewed.  He was hospitalized January 23 to February 3 with weakness.  Patient thought that it was the Entecavir and that we added that was causing it and the entacapone was discontinued.  He was treated for a urinary tract infection during the hospitalization, however.  Orthostasis was also noted, and bradycardia was noted, which has been an issue for him in the past.  He was readmitted to the hospital on February 16 to February 21.  This was also for generalized weakness and confusion.  He was diagnosed with pneumonia, either aspiration or community-acquired.  He also had mild acute diverticulitis which was treated.  When he had a few days later after discharge,  patient was readmitted to the hospital from February 25 to February 28.  Again, this was for progressive weakness, fatigue, lethargy.  This time, it was attributed to the bedtime clonazepam and it was held and they started Requip.  The patient did hold the clonazepam but did not ever start the ropinirole.  Patient states he actually thinks that the confusion was due to taking Tussionex that was prescribed after his prior hospitalization.  He saw primary care on March 25.  He was not sleeping well, but primarily because he  was having nocturia every 1-2 hours.  He was advised by urology after his discharge to resume his CPAP, which she had not done.  His primary care did just start him on trazodone.  Current prescribed movement disorder medications: Carbidopa/levodopa 25/100, 2/2/2/1 Carbidopa/levodopa 50/200 CR at bedtime  Clonazepam, 1 mg at bed  Prior meds:  entacapone (thought it caused weakness, but had urinary tract infection with hospitalization and was discharged and had another hospitalization right after it for pneumonia); clonazepam (I discontinued on one of his hospitalizations for weakness)    ALLERGIES:   Allergies  Allergen Reactions   Paxlovid [Nirmatrelvir-Ritonavir]     CURRENT MEDICATIONS:  Outpatient Encounter Medications as of 09/15/2023  Medication Sig   acetaminophen (TYLENOL) 500 MG tablet Take 500 mg by mouth as needed.   aspirin EC 81 MG tablet Take 1 tablet (81 mg total) by mouth at bedtime.   carbidopa-levodopa (SINEMET IR) 25-100 MG tablet TAKE 2 TABLETS AT 8:00AM , 2 TABLETS AT 11:00AM, 2 TABLETS AT 2:00PM AND 1 TABLET AT 5:00PM   furosemide (LASIX) 20 MG tablet Take 1 tablet (20 mg total) by mouth daily as needed (for swelling).   hydrALAZINE (APRESOLINE) 100 MG tablet Take 1 tablet (100 mg total) by mouth 3 (three) times daily.   hydrocortisone cream 1 % Apply topically 2 (two) times daily.   isosorbide mononitrate (IMDUR) 60 MG 24 hr tablet TAKE 1 TABLET BY MOUTH DAILY   melatonin 3 MG TABS tablet Take 1.5 mg by mouth at bedtime as needed.   Multiple Vitamin (MULTIVITAMIN) tablet Take 1 tablet by mouth daily.   nitroGLYCERIN (NITROSTAT) 0.4 MG SL tablet Place 1 tablet (0.4 mg total) under the tongue every 5 (five) minutes as needed for chest pain.   olopatadine (PATANOL) 0.1 % ophthalmic solution Place 1 drop into both eyes 2 (two) times daily. (Patient not taking: Reported on 09/01/2023)   omeprazole (PRILOSEC) 20 MG capsule Take 1 capsule (20 mg total) by mouth every  morning.   ondansetron (ZOFRAN) 4 MG tablet Take 1 tablet (4 mg total) by mouth every 6 (six) hours as needed for nausea or vomiting.   polyethylene glycol (MIRALAX / GLYCOLAX) packet Take 17 g by mouth daily as needed.   rOPINIRole (REQUIP) 1 MG tablet Take 1 tablet (1 mg total) by mouth daily as needed (restless legs). (Patient not taking: Reported on 09/01/2023)   rosuvastatin (CRESTOR) 10 MG tablet TAKE 1 TABLET BY MOUTH DAILY   tamsulosin (FLOMAX) 0.4 MG CAPS capsule Take 1 capsule (0.4 mg total) by mouth daily.   traZODone (DESYREL) 50 MG tablet Take 0.5-1 tablets (25-50 mg total) by mouth at bedtime as needed for sleep.   No facility-administered encounter medications on file as of 09/15/2023.    Objective:   PHYSICAL EXAMINATION:    VITALS:   There were no vitals filed for this visit.    No data found.  GEN:  The patient appears stated  age and is in NAD. HEENT:  Normocephalic, atraumatic.  The mucous membranes are moist. The superficial temporal arteries are without ropiness or tenderness.  CV:  regular Lungs: CTAB Neck:  there is L carotid bruit  Neurological examination:  Orientation: The patient is alert and oriented x3. Cranial nerves: There is good facial symmetry with minimal facial hypomimia. The speech is fluent and clear. Soft palate rises symmetrically and there is no tongue deviation. Hearing is slightly decreased to conversational tone. Sensation: Sensation is intact to light touch throughout Motor: Strength is 5/5 in the bilateral UE/LE DTR;s 2/4 at the bilateral biceps, triceps, brachioradialis, 2+-3- at the bilateral patella with cross adductor reflexes.    Movement examination: Tone: There is nl tone in the ue/le Abnormal movements: there is intermittent RUE rest tremor Coordination:  There is no decremation with RAM's, with any form of RAMS, including alternating supination and pronation of the forearm, hand opening and closing, finger taps, heel taps and  toe taps. Gait and Station: The patient has difficulty arising out of a deep-seated chair without the use of the hands.  He makes several unsuccessful attempts and then pushes off with the use of his hands.  He is leaning to the left.  He slightly drags the left leg.  He gets back to the chair and sits down and then tries again to get up without the use of his hands and it is successful at this time.    Total time spent on today's visit was *** minutes, including both face-to-face time and nonface-to-face time.  Time included that spent on review of records (prior notes available to me/labs/imaging if pertinent), discussing treatment and goals, answering patient's questions and coordinating care.    Cc:  [name], MD

## 2023-09-15 ENCOUNTER — Ambulatory Visit (INDEPENDENT_AMBULATORY_CARE_PROVIDER_SITE_OTHER): Payer: Medicare Other | Admitting: Neurology

## 2023-09-15 VITALS — BP 144/58 | HR 71 | Wt 156.8 lb

## 2023-09-15 DIAGNOSIS — G4733 Obstructive sleep apnea (adult) (pediatric): Secondary | ICD-10-CM | POA: Diagnosis not present

## 2023-09-15 DIAGNOSIS — G20A1 Parkinson's disease without dyskinesia, without mention of fluctuations: Secondary | ICD-10-CM | POA: Diagnosis not present

## 2023-09-15 DIAGNOSIS — R351 Nocturia: Secondary | ICD-10-CM

## 2023-09-17 ENCOUNTER — Ambulatory Visit: Payer: Medicare Other | Admitting: Physical Therapy

## 2023-09-17 DIAGNOSIS — R262 Difficulty in walking, not elsewhere classified: Secondary | ICD-10-CM | POA: Diagnosis not present

## 2023-09-17 DIAGNOSIS — R269 Unspecified abnormalities of gait and mobility: Secondary | ICD-10-CM | POA: Diagnosis not present

## 2023-09-17 DIAGNOSIS — R278 Other lack of coordination: Secondary | ICD-10-CM | POA: Diagnosis not present

## 2023-09-17 DIAGNOSIS — R2681 Unsteadiness on feet: Secondary | ICD-10-CM

## 2023-09-17 DIAGNOSIS — R2689 Other abnormalities of gait and mobility: Secondary | ICD-10-CM | POA: Diagnosis not present

## 2023-09-17 DIAGNOSIS — M6281 Muscle weakness (generalized): Secondary | ICD-10-CM | POA: Diagnosis not present

## 2023-09-17 NOTE — Therapy (Signed)
 OUTPATIENT PHYSICAL THERAPY NEURO TREATMENT   Patient Name: Johnathan Arnold. MRN: [id number] DOB:September 25, 1945, 78 y.o., male Today's Date: 09/17/2023  PCP: [name], MD  REFERRING PROVIDER: [name], MD   END OF SESSION:  PT End of Session - 09/17/23 1052     Visit Number 9    Number of Visits 24    Date for PT Re-Evaluation 11/09/23    Authorization Type Medicare, BCBS supplement    Progress Note Due on Visit 10    PT Start Time 1055    PT Stop Time 1137    PT Time Calculation (min) 42 min    Equipment Utilized During Treatment Gait belt    Activity Tolerance Patient tolerated treatment well    Behavior During Therapy [facility] for tasks assessed/performed                    Past Medical History:  Diagnosis Date   3-vessel coronary artery disease    s/p  5 vessel CABG   Allergy June 2024   Diabetes mellitus without complication (HCC)    History of cardiac catheterization 2011   [facility]   Hyperlipidemia    Hypertension    Hypertriglyceridemia    Neuromuscular disorder [facility]) October 2017   Diagonis Parkinson   Parkinson's disease [facility])    Pneumonia 12/28/2020   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 LIMA to LAD, SVG to D1, OM1 and RPDA   CORONARY ARTERY BYPASS GRAFT  03/1998   5 vessel, [facility]   RIGHT HEART CATH N/A 04/04/2019   Procedure: RIGHT HEART CATH;  Surgeon: Iran Ouch, MD;  Location: [facility] INVASIVE CV LAB;  Service: Cardiovascular;  Laterality: N/A;   RIGHT/LEFT HEART CATH AND CORONARY ANGIOGRAPHY N/A 02/01/2018   Procedure: RIGHT/LEFT HEART CATH AND CORONARY ANGIOGRAPHY;  Surgeon: Iran Ouch, MD;  Location: [facility] INVASIVE CV LAB;  Service: Cardiovascular;  Laterality: N/A;   Patient Active Problem List   Diagnosis Date Noted   Diverticulitis 07/26/2023   RSV (respiratory syncytial virus pneumonia) 07/26/2023    Impaired ambulation 07/02/2023   Orthostatic hypotension 05/22/2023   Generalized weakness 05/07/2021   Bradycardia    Anemia, unspecified 02/07/2021   Neutropenia (HCC) 01/29/2021   Thrombocytopenia (HCC) 01/29/2021   Right lower lobe pneumonia 12/28/2020   Hyponatremia 12/22/2020   Bilateral leg weakness 12/20/2020   Prostate cancer screening 09/08/2020   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) 09/06/2020   Low back pain 08/19/2019   Pulmonary hypertension (HCC)    Insomnia 12/21/2018   Sleep apnea in adult 12/09/2018   Periodic limb movement disorder 12/09/2018   Pulmonary nodules 05/18/2018   Wears hearing aid in both ears 05/17/2018   Leg pain, bilateral 02/20/2018   Dyspnea    CKD stage 3a, GFR 45-59 ml/min (HCC) 09/21/2017   History of skin cancer in adulthood 08/14/2016   Parkinson's disease (HCC) 08/09/2016   Bilateral carotid artery stenosis 04/02/2015   Vertigo, peripheral 10/17/2014   Benign prostatic hyperplasia with urinary frequency 01/31/2014   Obesity 04/03/2013   Other malaise and fatigue 09/21/2012   Hyperlipidemia    Essential hypertension    3-vessel coronary artery disease    S/P CABG x 5     ONSET DATE: 07/03/23  REFERRING DIAG: W09.811 (ICD-10-CM) - Weakness of both lower extremities  THERAPY DIAG:  Unsteadiness on feet  Difficulty in walking, not elsewhere classified  Other abnormalities of gait and mobility  Abnormality of gait and mobility  Rationale for Evaluation and Treatment: Rehabilitation  SUBJECTIVE:                                                                                                                                                                                             SUBJECTIVE STATEMENT:  Patient reports no changes. No falls or LOB. He slepts good last night and overall has been sleeping better.   Pt accompanied by: significant other  PERTINENT HISTORY:  From prior PT  evaluation: PD, history of COVID and significant weakness present following this diagnosis and the use of paxlovid ( August 5 dx date, thinks Paxlovid may have exacerbated PD symptoms)    Pt reports having COVID and having significant weakness.  Patient reports he also has back pain that is not present for a long time but was exacerbated more recently.  Patient previously did physical therapy for his back and experienced some relief but has not been consistent with the exercises.  Patient also has history of going to Parkinson's rock steady classes multiple times per week prior to onset of his COVID but he has not been back since. Patient previously ambulated without an assistive device but is now ambulating with a straight point cane.  Patient reports increased foot and ankle weakness in comparison with his hips and knees.  Patient also reports low back pain that is exacerbated with prolonged standing.  PAIN:  Are you having pain? No  PRECAUTIONS: Fall  RED FLAGS: None   WEIGHT BEARING RESTRICTIONS: No  FALLS: Has patient fallen in last 6 months? Yes. Number of falls 6  LIVING ENVIRONMENT: Lives with: lives with their spouse Lives in: House/apartment Stairs: Yes: Internal: 15 steps; on left going up and External: 1 steps; none Has following equipment at home: Quad cane large base and Walker - 2 wheeled  PLOF: Independent  PATIENT GOALS: to improve overall strength, get back to walking more regularly  OBJECTIVE:  Note: Objective measures were completed at Evaluation unless otherwise noted.  DIAGNOSTIC FINDINGS:   EXAM: CT HEAD WITHOUT CONTRAST  IMPRESSION: 1. No acute intracranial process. 2. Mild chronic small vessel ischemic changes. 3. Acute right frontal and maxillary sinusitis.  COGNITION: Overall cognitive status: Within functional limits for tasks assessed   SENSATION: WFL  COORDINATION: WFL    LOWER EXTREMITY ROM:     Active  Right Eval Left Eval  Hip  flexion    Hip extension    Hip abduction  Hip adduction    Hip internal rotation    Hip external rotation    Knee flexion    Knee extension    Ankle dorsiflexion    Ankle plantarflexion    Ankle inversion    Ankle eversion     (Blank rows = not tested)  LOWER EXTREMITY MMT:    MMT Right Eval Left Eval  Hip flexion 4 4  Hip abduction 4 4  Hip adduction 4 4  Knee flexion 3+ 3+  Knee extension 4- 4-  Ankle dorsiflexion 4+ 4  (Blank rows = not tested)  BED MOBILITY:  No limitations according to the pt.  TRANSFERS: Assistive device utilized: None  Sit to stand: Complete Independence Stand to sit: Complete Independence Chair to chair: Complete Independence Floor:  Not tested  FUNCTIONAL TESTS:  5 times sit to stand: 15.03 sec Timed up and go (TUG): 13.38 sec 6 minute walk test: TBD 10 meter walk test: 12.15 sec; 0.82 m/s Dynamic Gait Index: TBD  PATIENT SURVEYS:  ABC scale 70.6%                                                                                                                              TREATMENT DATE: 09/17/23 TE- To improve strength, endurance, mobility, and function of specific targeted muscle groups or improve joint range of motion or improve muscle flexibility  Leg press 3 x 10 @ 55#, pt reports hard difficulty    Rows cable column B with 17.5#   TA- To improve functional movements patterns for everyday tasks   Ambulation with 4WW and 4# AW x 1000 ft, good posture, no c/o fatigue, goood foot clearance  NMR:   Octane level 5 x 6 min for B UE and LE reciprocal movement training.   Modified all 4s to leaning on chair PWR! Moves x 10 ea of up, rock, twist, step  Seated PWR! Step x 10 ea with 4# AW  Standing rows on able column 2 x 15 @ 17.5# ( using B uE )  PWR! Up targets postural strengthening and antigravity extension, PRW! Rock targets functional weight shifting, PRW! Twist targets trunk rotation and PRW! Step targets transition  movements. PWR! Moves target bradykinesia, rigidity, and dyskinesia through targeted functional movements that address four core movement difficulties for people with Parkinson's disease.     PATIENT EDUCATION: Education details: Pt educated throughout session about proper posture and technique with exercises. Improved exercise technique, movement at target joints, use of target muscles after min to mod verbal, visual, tactile cues  Person educated: Patient and Spouse Education method: Explanation, Demonstration, and Verbal cues Education comprehension: verbalized understanding  HOME EXERCISE PROGRAM: Seated PWR! Moves x 10 ea, handout provided   GOALS: Goals reviewed with patient? Yes  SHORT TERM GOALS: Target date: 09/14/2023    Pt will be independent with HEP in order to demonstrate increased ability to perform tasks related to occupation/hobbies. Baseline:  Pt to  be given HEP at next visit. Goal status: INITIAL  LONG TERM GOALS: Target date: 11/09/2023  1.  Patient (> 59 years old) will complete five times sit to stand test in < 15 seconds indicating an increased LE strength and improved balance. Baseline: 15.03 sec Goal status: INITIAL  2.  Patient will increase their ABC scale score to be 80.6% (an increase of 10 points), demonstrating improved balance confidence and reduced fall risk, as measured through weekly assessments. Baseline: 70.6% Goal status: INITIAL   3.  Patient will increase Berg Balance score by > 6 points to demonstrate decreased fall risk during functional activities. Baseline: TBD Goal status: INITIAL   4.  Patient will reduce timed up and go to <11 seconds to reduce fall risk and demonstrate improved transfer/gait ability. Baseline: 13.38 sec Goal status: INITIAL  5.  Patient will increase 10 meter walk test to >1.83m/s as to improve gait speed for better community ambulation and to reduce fall risk. Baseline: 12.15 sec; 0.82 m/s Goal status:  INITIAL  6.  Patient will increase six minute walk test distance to >1000 for progression to community ambulator and improve gait ability Baseline: 459 ft with RW stopping at 5:30 due to fatigue  Goal status: INITIAL   ASSESSMENT:  CLINICAL IMPRESSION:  Patient arrived with good motivation for completion of pt activities.  Pt showed continued improvement with ambulatory endurance and fatigue.  Pt still showing improved tolerance with gait with AD in relation to his low back pain and fatigue. Continuing to progress with each session with reps, resistance, intensity and overall volume. Encouraged pt of progress with ambulatory activities and balance. Pt will continue to benefit from skilled physical therapy intervention to address impairments, improve QOL, and attain therapy goals.   OBJECTIVE IMPAIRMENTS: Abnormal gait, decreased activity tolerance, decreased balance, decreased endurance, decreased knowledge of use of DME, decreased mobility, difficulty walking, decreased ROM, decreased strength, and decreased safety awareness.   ACTIVITY LIMITATIONS: carrying, lifting, bending, standing, squatting, bathing, and locomotion level  PARTICIPATION LIMITATIONS: cleaning, laundry, driving, shopping, community activity, and yard work  PERSONAL FACTORS: Age, Education, Past/current experiences, Time since onset of injury/illness/exacerbation, and 3+ comorbidities: orthostatic HTN, syncope, falls, anemia, insomnia, dyspnea  are also affecting patient's functional outcome.   REHAB POTENTIAL: Fair pt has been seen in the past.  CLINICAL DECISION MAKING: Evolving/moderate complexity  EVALUATION COMPLEXITY: Moderate  PLAN:  PT FREQUENCY: 2x/week  PT DURATION: 12 weeks  PLANNED INTERVENTIONS: 97110-Therapeutic exercises, 97530- Therapeutic activity, 97112- Neuromuscular re-education, 97535- Self Care, 16109- Manual therapy, [phone]- Gait training, Balance training, Stair training, and Vestibular  training  PLAN FOR NEXT SESSION: Balance and strength interventions PD specific interventions as indicated    Norman Herrlich PT ,DPT Physical Therapist- [place]  [facility]  09/17/23, 11:02 AM

## 2023-09-21 ENCOUNTER — Ambulatory Visit: Payer: Medicare Other | Admitting: Physical Therapy

## 2023-09-21 DIAGNOSIS — R2689 Other abnormalities of gait and mobility: Secondary | ICD-10-CM

## 2023-09-21 DIAGNOSIS — R269 Unspecified abnormalities of gait and mobility: Secondary | ICD-10-CM | POA: Diagnosis not present

## 2023-09-21 DIAGNOSIS — R2681 Unsteadiness on feet: Secondary | ICD-10-CM

## 2023-09-21 DIAGNOSIS — R262 Difficulty in walking, not elsewhere classified: Secondary | ICD-10-CM | POA: Diagnosis not present

## 2023-09-21 DIAGNOSIS — R278 Other lack of coordination: Secondary | ICD-10-CM | POA: Diagnosis not present

## 2023-09-21 DIAGNOSIS — M6281 Muscle weakness (generalized): Secondary | ICD-10-CM | POA: Diagnosis not present

## 2023-09-21 NOTE — Therapy (Signed)
 OUTPATIENT PHYSICAL THERAPY NEURO TREATMENT/ Physical Therapy Progress Note   Dates of reporting period  08/17/23   to   09/21/23    Patient Name: Johnathan Arnold. MRN: [id number] DOB:[birth year], 78 y.o., male Today's Date: 09/21/2023  PCP: [name], MD  REFERRING PROVIDER: [name], MD   END OF SESSION:  PT End of Session - 09/21/23 1149     Visit Number 10    Number of Visits 24    Date for PT Re-Evaluation 11/09/23    Authorization Type Medicare, BCBS supplement    Progress Note Due on Visit 10    PT Start Time 1100    PT Stop Time 1141    PT Time Calculation (min) 41 min    Equipment Utilized During Treatment Gait belt    Activity Tolerance Patient tolerated treatment well    Behavior During Therapy [facility] for tasks assessed/performed                    Past Medical History:  Diagnosis Date   3-vessel coronary artery disease    s/p  5 vessel CABG   Allergy June 2024   Diabetes mellitus without complication (HCC)    History of cardiac catheterization 2011   [facility]   Hyperlipidemia    Hypertension    Hypertriglyceridemia    Neuromuscular disorder [facility]) October 2017   Diagonis Parkinson   Parkinson's disease [facility])    Pneumonia 12/28/2020   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 LIMA to LAD, SVG to D1, OM1 and RPDA   CORONARY ARTERY BYPASS GRAFT  03/1998   5 vessel, [facility]   RIGHT HEART CATH N/A 04/04/2019   Procedure: RIGHT HEART CATH;  Surgeon: Iran Ouch, MD;  Location: [facility] INVASIVE CV LAB;  Service: Cardiovascular;  Laterality: N/A;   RIGHT/LEFT HEART CATH AND CORONARY ANGIOGRAPHY N/A 02/01/2018   Procedure: RIGHT/LEFT HEART CATH AND CORONARY ANGIOGRAPHY;  Surgeon: Iran Ouch, MD;  Location: [facility] INVASIVE CV LAB;  Service: Cardiovascular;  Laterality: N/A;   Patient Active Problem List   Diagnosis Date  Noted   Diverticulitis 07/26/2023   RSV (respiratory syncytial virus pneumonia) 07/26/2023   Impaired ambulation 07/02/2023   Orthostatic hypotension 05/22/2023   Generalized weakness 05/07/2021   Bradycardia    Anemia, unspecified 02/07/2021   Neutropenia (HCC) 01/29/2021   Thrombocytopenia (HCC) 01/29/2021   Right lower lobe pneumonia 12/28/2020   Hyponatremia 12/22/2020   Bilateral leg weakness 12/20/2020   Prostate cancer screening 09/08/2020   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) 09/06/2020   Low back pain 08/19/2019   Pulmonary hypertension (HCC)    Insomnia 12/21/2018   Sleep apnea in adult 12/09/2018   Periodic limb movement disorder 12/09/2018   Pulmonary nodules 05/18/2018   Wears hearing aid in both ears 05/17/2018   Leg pain, bilateral 02/20/2018   Dyspnea    CKD stage 3a, GFR 45-59 ml/min (HCC) 09/21/2017   History of skin cancer in adulthood 08/14/2016   Parkinson's disease (HCC) 08/09/2016   Bilateral carotid artery stenosis 04/02/2015   Vertigo, peripheral 10/17/2014   Benign prostatic hyperplasia with urinary frequency 01/31/2014   Obesity 04/03/2013   Other malaise and fatigue 09/21/2012   Hyperlipidemia    Essential hypertension    3-vessel coronary artery disease    S/P CABG x 5  ONSET DATE: 07/03/23  REFERRING DIAG: Z61.096 (ICD-10-CM) - Weakness of both lower extremities   THERAPY DIAG:  Unsteadiness on feet  Difficulty in walking, not elsewhere classified  Other abnormalities of gait and mobility  Abnormality of gait and mobility  Rationale for Evaluation and Treatment: Rehabilitation  SUBJECTIVE:                                                                                                                                                                                             SUBJECTIVE STATEMENT:  Patient reports no changes. No falls or LOB.   Pt accompanied by: significant other  PERTINENT HISTORY:   From prior PT evaluation: PD, history of COVID and significant weakness present following this diagnosis and the use of paxlovid ( August 5 dx date, thinks Paxlovid may have exacerbated PD symptoms)    Pt reports having COVID and having significant weakness.  Patient reports he also has back pain that is not present for a long time but was exacerbated more recently.  Patient previously did physical therapy for his back and experienced some relief but has not been consistent with the exercises.  Patient also has history of going to Parkinson's rock steady classes multiple times per week prior to onset of his COVID but he has not been back since. Patient previously ambulated without an assistive device but is now ambulating with a straight point cane.  Patient reports increased foot and ankle weakness in comparison with his hips and knees.  Patient also reports low back pain that is exacerbated with prolonged standing.  PAIN:  Are you having pain? No  PRECAUTIONS: Fall  RED FLAGS: None   WEIGHT BEARING RESTRICTIONS: No  FALLS: Has patient fallen in last 6 months? Yes. Number of falls 6  LIVING ENVIRONMENT: Lives with: lives with their spouse Lives in: House/apartment Stairs: Yes: Internal: 15 steps; on left going up and External: 1 steps; none Has following equipment at home: Quad cane large base and Walker - 2 wheeled  PLOF: Independent  PATIENT GOALS: to improve overall strength, get back to walking more regularly  OBJECTIVE:  Note: Objective measures were completed at Evaluation unless otherwise noted.  DIAGNOSTIC FINDINGS:   EXAM: CT HEAD WITHOUT CONTRAST  IMPRESSION: 1. No acute intracranial process. 2. Mild chronic small vessel ischemic changes. 3. Acute right frontal and maxillary sinusitis.  COGNITION: Overall cognitive status: Within functional limits for tasks assessed   SENSATION: WFL  COORDINATION: WFL    LOWER EXTREMITY ROM:     Active  Right Eval  Left Eval  Hip flexion    Hip extension  Hip abduction    Hip adduction    Hip internal rotation    Hip external rotation    Knee flexion    Knee extension    Ankle dorsiflexion    Ankle plantarflexion    Ankle inversion    Ankle eversion     (Blank rows = not tested)  LOWER EXTREMITY MMT:    MMT Right Eval Left Eval  Hip flexion 4 4  Hip abduction 4 4  Hip adduction 4 4  Knee flexion 3+ 3+  Knee extension 4- 4-  Ankle dorsiflexion 4+ 4  (Blank rows = not tested)  BED MOBILITY:  No limitations according to the pt.  TRANSFERS: Assistive device utilized: None  Sit to stand: Complete Independence Stand to sit: Complete Independence Chair to chair: Complete Independence Floor:  Not tested  FUNCTIONAL TESTS:  5 times sit to stand: 15.03 sec Timed up and go (TUG): 13.38 sec 6 minute walk test: TBD 10 meter walk test: 12.15 sec; 0.82 m/s Dynamic Gait Index: TBD  PATIENT SURVEYS:  ABC scale 70.6%                                                                                                                              TREATMENT DATE: 09/21/23 PHYSICAL PERFORMANCE  10 Meter Walk Test: Patient instructed to walk 10 meters (32.8 ft) as quickly and as safely as possible at their normal speed x2 and at a fast speed x2. Time measured from 2 meter mark to 8 meter mark to accommodate ramp-up and ramp-down.  Normal speed 1: .925 m/s  Normal speed 2: .943 m/s  Average Normal speed: .934 m/s Fast speed 1: 1.12 m/s 8.9 Fast speed 2: 1.06 m/s 9.42 Average Fast speed: 1.09 m/s Cut off scores: <0.4 m/s = household Ambulator, 0.4-0.8 m/s = limited community Ambulator, >0.8 m/s = community Ambulator, >1.2 m/s = crossing a street, <1.0 = increased fall risk MCID 0.05 m/s (small), 0.13 m/s (moderate) (ANPTA Core Set of Outcome Measures for Adults with Neurologic Conditions, 2018)    [facility] PT Assessment - 09/21/23 0001       Berg Balance Test   Sit to Stand Able to stand  without using hands and stabilize independently    Standing Unsupported Able to stand safely 2 minutes    Sitting with Back Unsupported but Feet Supported on Floor or Stool Able to sit safely and securely 2 minutes    Stand to Sit Sits safely with minimal use of hands    Transfers Able to transfer safely, minor use of hands    Standing Unsupported with Eyes Closed Able to stand 10 seconds safely    Standing Unsupported with Feet Together Able to place feet together independently and stand 1 minute safely    From Standing, Reach Forward with Outstretched Arm Can reach confidently >25 cm (10")    From Standing Position, Pick up Object from Floor Able to pick up shoe  safely and easily    From Standing Position, Turn to Look Behind Over each Shoulder Turn sideways only but maintains balance    Turn 360 Degrees Able to turn 360 degrees safely but slowly    Standing Unsupported, Alternately Place Feet on Step/Stool Able to stand independently and safely and complete 8 steps in 20 seconds    Standing Unsupported, One Foot in Front Able to plae foot ahead of the other independently and hold 30 seconds    Standing on One Leg Able to lift leg independently and hold equal to or more than 3 seconds    Total Score 49           PT instructed pt in TUG: 11.4  sec ( >13.5 sec indicates increased fall risk)  6 Min Walk Test:  Instructed patient to ambulate as quickly and as safely as possible for 6 minutes using LRAD. Patient was allowed to take standing rest breaks without stopping the test, but if the patient required a sitting rest break the clock would be stopped and the test would be over.  Results: 1128 feet using a 4WW   Age Matched Norms (in meters): 63-69 yo M: 22 F: 12, 38-79 yo M: 29 F: 471, 70-89 yo M: 417 F: 392 MDC: 58.21 meters (190.98 feet) or 50 meters (ANPTA Core Set of Outcome Measures for Adults with Neurologic Conditions, 2018)  Five times Sit to Stand Test (FTSS)  TIME: 11.79   sec  Cut off scores indicative of increased fall risk: >12 sec CVA, >16 sec PD, >13 sec vestibular (ANPTA Core Set of Outcome Measures for Adults with Neurologic Conditions, 2018)  Patient demonstrates increased fall risk as noted by score of  49 /56 on Berg Balance Scale.  (<36= high risk for falls, close to 100%; 37-45 significant >80%; 46-51 moderate >50%; 52-55 lower >25%)  [facility] PT Assessment - 09/21/23 0001       Berg Balance Test   Sit to Stand Able to stand without using hands and stabilize independently    Standing Unsupported Able to stand safely 2 minutes    Sitting with Back Unsupported but Feet Supported on Floor or Stool Able to sit safely and securely 2 minutes    Stand to Sit Sits safely with minimal use of hands    Transfers Able to transfer safely, minor use of hands    Standing Unsupported with Eyes Closed Able to stand 10 seconds safely    Standing Unsupported with Feet Together Able to place feet together independently and stand 1 minute safely    From Standing, Reach Forward with Outstretched Arm Can reach confidently >25 cm (10")    From Standing Position, Pick up Object from Floor Able to pick up shoe safely and easily    From Standing Position, Turn to Look Behind Over each Shoulder Turn sideways only but maintains balance    Turn 360 Degrees Able to turn 360 degrees safely but slowly    Standing Unsupported, Alternately Place Feet on Step/Stool Able to stand independently and safely and complete 8 steps in 20 seconds    Standing Unsupported, One Foot in Front Able to plae foot ahead of the other independently and hold 30 seconds    Standing on One Leg Able to lift leg independently and hold equal to or more than 3 seconds    Total Score 49             PATIENT EDUCATION: Education details: Pt educated throughout session  about proper posture and technique with exercises. Improved exercise technique, movement at target joints, use of target muscles after min to  mod verbal, visual, tactile cues  Person educated: Patient and Spouse Education method: Explanation, Demonstration, and Verbal cues Education comprehension: verbalized understanding  HOME EXERCISE PROGRAM: Seated PWR! Moves x 10 ea, handout provided   GOALS: Goals reviewed with patient? Yes  SHORT TERM GOALS: Target date: 09/14/2023    Pt will be independent with HEP in order to demonstrate increased ability to perform tasks related to occupation/hobbies. Baseline:  Pt to be given HEP at next visit. Goal status: INITIAL  LONG TERM GOALS: Target date: 11/09/2023  1.  Patient (> 101 years old) will complete five times sit to stand test in < 15 seconds indicating an increased LE strength and improved balance. Baseline: 15.03 sec 4/14: 11.79 sec Goal status: INITIAL  2.  Patient will increase their ABC scale score to be 80.6% (an increase of 10 points), demonstrating improved balance confidence and reduced fall risk, as measured through weekly assessments. Baseline: 70.6% 4/14:72 % Goal status: INITIAL   3.  Patient will increase Berg Balance score by > 6 points to demonstrate decreased fall risk during functional activities. Baseline: 43 on 3/13 4/14:  Goal status: INITIAL   4.  Patient will reduce timed up and go to <11 seconds to reduce fall risk and demonstrate improved transfer/gait ability. Baseline: 13.38 sec 4/14: 11.4 sec no AD Goal status: INITIAL  5.  Patient will increase 10 meter walk test to >1.78m/s as to improve gait speed for better community ambulation and to reduce fall risk. Baseline: 12.15 sec; 0.82 m/s Goal status: INITIAL  6.  Patient will increase six minute walk test distance to >1000 for progression to community ambulator and improve gait ability Baseline: 459 ft with RW stopping at 5:30 due to fatigue  09/21/23:1128 ft with 4WW Goal status: INITIAL   ASSESSMENT:  CLINICAL IMPRESSION:  Pt presents for progress note this date. Pt shows improvement  across the board with his functional outcome measures. Pt improvement in significant compared to evaluation. Pt recommended to use a rollator or walker for prolonged walking to aid with his back discomfort.  Patient shows progress with his Randye Buttner balance score showing decreased risk of falls as well as progress with a 10 m walk test, timed up and go, and 5 times sit to stand.  All these show significant improvement since initial evaluation.  Patient recommended to go back to [facility] steady boxing now that he is functioning closer to what he was prior to his hospitalization.  Patient still making progress toward his goals and will continue to benefit from skilled physical therapy to further improve his strength, mobility, and decrease his risk for falls. Patient's condition has the potential to improve in response to therapy. Maximum improvement is yet to be obtained. The anticipated improvement is attainable and reasonable in a generally predictable time.     OBJECTIVE IMPAIRMENTS: Abnormal gait, decreased activity tolerance, decreased balance, decreased endurance, decreased knowledge of use of DME, decreased mobility, difficulty walking, decreased ROM, decreased strength, and decreased safety awareness.   ACTIVITY LIMITATIONS: carrying, lifting, bending, standing, squatting, bathing, and locomotion level  PARTICIPATION LIMITATIONS: cleaning, laundry, driving, shopping, community activity, and yard work  PERSONAL FACTORS: Age, Education, Past/current experiences, Time since onset of injury/illness/exacerbation, and 3+ comorbidities: orthostatic HTN, syncope, falls, anemia, insomnia, dyspnea  are also affecting patient's functional outcome.   REHAB POTENTIAL: Fair pt has been seen  in the past.  CLINICAL DECISION MAKING: Evolving/moderate complexity  EVALUATION COMPLEXITY: Moderate  PLAN:  PT FREQUENCY: 2x/week  PT DURATION: 12 weeks  PLANNED INTERVENTIONS: 97110-Therapeutic exercises, 97530-  Therapeutic activity, 97112- Neuromuscular re-education, 97535- Self Care, [id number]- Manual therapy, [phone]- Gait training, Balance training, Stair training, and Vestibular training  PLAN FOR NEXT SESSION: Balance and strength interventions PD specific interventions as indicated    Edwina Gram PT ,DPT Physical Therapist- [facility]  09/21/23, 4:03 PM

## 2023-09-24 ENCOUNTER — Ambulatory Visit: Payer: Medicare Other | Admitting: Physical Therapy

## 2023-09-24 DIAGNOSIS — R2681 Unsteadiness on feet: Secondary | ICD-10-CM

## 2023-09-24 DIAGNOSIS — R2689 Other abnormalities of gait and mobility: Secondary | ICD-10-CM

## 2023-09-24 DIAGNOSIS — R269 Unspecified abnormalities of gait and mobility: Secondary | ICD-10-CM | POA: Diagnosis not present

## 2023-09-24 DIAGNOSIS — R278 Other lack of coordination: Secondary | ICD-10-CM | POA: Diagnosis not present

## 2023-09-24 DIAGNOSIS — R262 Difficulty in walking, not elsewhere classified: Secondary | ICD-10-CM | POA: Diagnosis not present

## 2023-09-24 DIAGNOSIS — M6281 Muscle weakness (generalized): Secondary | ICD-10-CM | POA: Diagnosis not present

## 2023-09-24 NOTE — Therapy (Deleted)
 OUTPATIENT PHYSICAL THERAPY NEURO TREATMENT    Patient Name: Johnathan Arnold. MRN: [id number] DOB:[birth year], 78 y.o., male Today's Date: 09/24/2023  PCP: Thersia Flax, MD  REFERRING PROVIDER: Thersia Flax, MD   END OF SESSION:           Past Medical History:  Diagnosis Date   3-vessel coronary artery disease    s/p  5 vessel CABG   Allergy June 2024   Diabetes mellitus without complication (HCC)    History of cardiac catheterization 2011   [facility]   Hyperlipidemia    Hypertension    Hypertriglyceridemia    Neuromuscular disorder [facility]) October 2017   Diagonis Parkinson   Parkinson's disease 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 LIMA to LAD, SVG to D1, OM1 and RPDA   CORONARY ARTERY BYPASS GRAFT  03/1998   5 vessel, [facility]   RIGHT HEART CATH N/A 04/04/2019   Procedure: RIGHT HEART CATH;  Surgeon: Wenona Hamilton, MD;  Location: [facility] INVASIVE CV LAB;  Service: Cardiovascular;  Laterality: N/A;   RIGHT/LEFT HEART CATH AND CORONARY ANGIOGRAPHY N/A 02/01/2018   Procedure: RIGHT/LEFT HEART CATH AND CORONARY ANGIOGRAPHY;  Surgeon: Wenona Hamilton, MD;  Location: [facility] INVASIVE CV LAB;  Service: Cardiovascular;  Laterality: N/A;   Patient Active Problem List   Diagnosis Date Noted   Diverticulitis 07/26/2023   RSV (respiratory syncytial virus pneumonia) 07/26/2023   Impaired ambulation 07/02/2023   Orthostatic hypotension 05/22/2023   Generalized weakness 05/07/2021   Bradycardia    Anemia, unspecified 02/07/2021   Neutropenia (HCC) 01/29/2021   Thrombocytopenia (HCC) 01/29/2021   Right lower lobe pneumonia 12/28/2020   Hyponatremia 12/22/2020   Bilateral leg weakness 12/20/2020   Prostate cancer screening 09/08/2020   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) 09/06/2020   Low back  pain 08/19/2019   Pulmonary hypertension (HCC)    Insomnia 12/21/2018   Sleep apnea in adult 12/09/2018   Periodic limb movement disorder 12/09/2018   Pulmonary nodules 05/18/2018   Wears hearing aid in both ears 05/17/2018   Leg pain, bilateral 02/20/2018   Dyspnea    CKD stage 3a, GFR 45-59 ml/min (HCC) 09/21/2017   History of skin cancer in adulthood 08/14/2016   Parkinson's disease (HCC) 08/09/2016   Bilateral carotid artery stenosis 04/02/2015   Vertigo, peripheral 10/17/2014   Benign prostatic hyperplasia with urinary frequency 01/31/2014   Obesity 04/03/2013   Other malaise and fatigue 09/21/2012   Hyperlipidemia    Essential hypertension    3-vessel coronary artery disease    S/P CABG x 5     ONSET DATE: 07/03/23  REFERRING DIAG: W09.811 (ICD-10-CM) - Weakness of both lower extremities   THERAPY DIAG:  No diagnosis found.  Rationale for Evaluation and Treatment: Rehabilitation  SUBJECTIVE:  SUBJECTIVE STATEMENT:  Patient reports no changes. No falls or LOB.   Pt accompanied by: significant other  PERTINENT HISTORY:  From prior PT evaluation: PD, history of COVID and significant weakness present following this diagnosis and the use of paxlovid ( August 5 dx date, thinks Paxlovid may have exacerbated PD symptoms)    Pt reports having COVID and having significant weakness.  Patient reports he also has back pain that is not present for a long time but was exacerbated more recently.  Patient previously did physical therapy for his back and experienced some relief but has not been consistent with the exercises.  Patient also has history of going to Parkinson's rock steady classes multiple times per week prior to onset of his COVID but he has not been back since. Patient previously  ambulated without an assistive device but is now ambulating with a straight point cane.  Patient reports increased foot and ankle weakness in comparison with his hips and knees.  Patient also reports low back pain that is exacerbated with prolonged standing.  PAIN:  Are you having pain? No  PRECAUTIONS: Fall  RED FLAGS: None   WEIGHT BEARING RESTRICTIONS: No  FALLS: Has patient fallen in last 6 months? Yes. Number of falls 6  LIVING ENVIRONMENT: Lives with: lives with their spouse Lives in: House/apartment Stairs: Yes: Internal: 15 steps; on left going up and External: 1 steps; none Has following equipment at home: Quad cane large base and Walker - 2 wheeled  PLOF: Independent  PATIENT GOALS: to improve overall strength, get back to walking more regularly  OBJECTIVE:  Note: Objective measures were completed at Evaluation unless otherwise noted.  DIAGNOSTIC FINDINGS:   EXAM: CT HEAD WITHOUT CONTRAST  IMPRESSION: 1. No acute intracranial process. 2. Mild chronic small vessel ischemic changes. 3. Acute right frontal and maxillary sinusitis.  COGNITION: Overall cognitive status: Within functional limits for tasks assessed   SENSATION: WFL  COORDINATION: WFL    LOWER EXTREMITY ROM:     Active  Right Eval Left Eval  Hip flexion    Hip extension    Hip abduction    Hip adduction    Hip internal rotation    Hip external rotation    Knee flexion    Knee extension    Ankle dorsiflexion    Ankle plantarflexion    Ankle inversion    Ankle eversion     (Blank rows = not tested)  LOWER EXTREMITY MMT:    MMT Right Eval Left Eval  Hip flexion 4 4  Hip abduction 4 4  Hip adduction 4 4  Knee flexion 3+ 3+  Knee extension 4- 4-  Ankle dorsiflexion 4+ 4  (Blank rows = not tested)  BED MOBILITY:  No limitations according to the pt.  TRANSFERS: Assistive device utilized: None  Sit to stand: Complete Independence Stand to sit: Complete  Independence Chair to chair: Complete Independence Floor:  Not tested  FUNCTIONAL TESTS:  5 times sit to stand: 15.03 sec Timed up and go (TUG): 13.38 sec 6 minute walk test: TBD 10 meter walk test: 12.15 sec; 0.82 m/s Dynamic Gait Index: TBD  PATIENT SURVEYS:  ABC scale 70.6%  TREATMENT DATE: 09/24/23    PATIENT EDUCATION: Education details: Pt educated throughout session about proper posture and technique with exercises. Improved exercise technique, movement at target joints, use of target muscles after min to mod verbal, visual, tactile cues  Person educated: Patient and Spouse Education method: Explanation, Demonstration, and Verbal cues Education comprehension: verbalized understanding  HOME EXERCISE PROGRAM: Seated PWR! Moves x 10 ea, handout provided   GOALS: Goals reviewed with patient? Yes  SHORT TERM GOALS: Target date: 09/14/2023    Pt will be independent with HEP in order to demonstrate increased ability to perform tasks related to occupation/hobbies. Baseline:  Pt to be given HEP at next visit. Goal status: INITIAL  LONG TERM GOALS: Target date: 11/09/2023  1.  Patient (> 77 years old) will complete five times sit to stand test in < 15 seconds indicating an increased LE strength and improved balance. Baseline: 15.03 sec 4/14: 11.79 sec Goal status: INITIAL  2.  Patient will increase their ABC scale score to be 80.6% (an increase of 10 points), demonstrating improved balance confidence and reduced fall risk, as measured through weekly assessments. Baseline: 70.6% 4/14:72 % Goal status: INITIAL   3.  Patient will increase Berg Balance score by > 6 points to demonstrate decreased fall risk during functional activities. Baseline: 43 on 3/13 4/14:  Goal status: INITIAL   4.  Patient will reduce timed up and go to <11 seconds to reduce  fall risk and demonstrate improved transfer/gait ability. Baseline: 13.38 sec 4/14: 11.4 sec no AD Goal status: INITIAL  5.  Patient will increase 10 meter walk test to >1.16m/s as to improve gait speed for better community ambulation and to reduce fall risk. Baseline: 12.15 sec; 0.82 m/s Goal status: INITIAL  6.  Patient will increase six minute walk test distance to >1000 for progression to community ambulator and improve gait ability Baseline: 459 ft with RW stopping at 5:30 due to fatigue  09/21/23:1128 ft with 4WW Goal status: INITIAL   ASSESSMENT:  CLINICAL IMPRESSION:  Pt presents for progress note this date. Pt shows improvement across the board with his functional outcome measures. Pt improvement in significant compared to evaluation. Pt recommended to use a rollator or walker for prolonged walking to aid with his back discomfort.  Patient shows progress with his Randye Buttner balance score showing decreased risk of falls as well as progress with a 10 m walk test, timed up and go, and 5 times sit to stand.  All these show significant improvement since initial evaluation.  Patient recommended to go back to [facility] steady boxing now that he is functioning closer to what he was prior to his hospitalization.  Patient still making progress toward his goals and will continue to benefit from skilled physical therapy to further improve his strength, mobility, and decrease his risk for falls. Patient's condition has the potential to improve in response to therapy. Maximum improvement is yet to be obtained. The anticipated improvement is attainable and reasonable in a generally predictable time.     OBJECTIVE IMPAIRMENTS: Abnormal gait, decreased activity tolerance, decreased balance, decreased endurance, decreased knowledge of use of DME, decreased mobility, difficulty walking, decreased ROM, decreased strength, and decreased safety awareness.   ACTIVITY LIMITATIONS: carrying, lifting, bending,  standing, squatting, bathing, and locomotion level  PARTICIPATION LIMITATIONS: cleaning, laundry, driving, shopping, community activity, and yard work  PERSONAL FACTORS: Age, Education, Past/current experiences, Time since onset of injury/illness/exacerbation, and 3+ comorbidities: orthostatic HTN, syncope, falls, anemia, insomnia, dyspnea  are  also affecting patient's functional outcome.   REHAB POTENTIAL: Fair pt has been seen in the past.  CLINICAL DECISION MAKING: Evolving/moderate complexity  EVALUATION COMPLEXITY: Moderate  PLAN:  PT FREQUENCY: 2x/week  PT DURATION: 12 weeks  PLANNED INTERVENTIONS: 97110-Therapeutic exercises, 97530- Therapeutic activity, 97112- Neuromuscular re-education, 97535- Self Care, 69629- Manual therapy, [phone]- Gait training, Balance training, Stair training, and Vestibular training  PLAN FOR NEXT SESSION: Balance and strength interventions PD specific interventions as indicated    Edwina Gram PT ,DPT Physical Therapist- [facility]  [facility]  09/24/23, 10:41 AM

## 2023-09-24 NOTE — Therapy (Addendum)
 OUTPATIENT PHYSICAL THERAPY NEURO TREATMENT    Patient Name: Johnathan Arnold. MRN: [id number] DOB:Feb 17, 1946, 78 y.o., male Today's Date: 09/24/2023  PCP: Thersia Flax, MD  REFERRING PROVIDER: Thersia Flax, MD   END OF SESSION:  PT End of Session - 09/24/23 1051     Visit Number 11    Number of Visits 24    Date for PT Re-Evaluation 11/09/23    Authorization Type Medicare, BCBS supplement    Progress Note Due on Visit 20    PT Start Time 1052    PT Stop Time 1135    PT Time Calculation (min) 43 min    Equipment Utilized During Treatment Gait belt    Activity Tolerance Patient tolerated treatment well    Behavior During Therapy [facility] for tasks assessed/performed                     Past Medical History:  Diagnosis Date   3-vessel coronary artery disease    s/p  5 vessel CABG   Allergy June 2024   Diabetes mellitus without complication (HCC)    History of cardiac catheterization 2011   [facility]   Hyperlipidemia    Hypertension    Hypertriglyceridemia    Neuromuscular disorder [facility]) October 2017   Diagonis Parkinson   Parkinson's disease [facility])    Pneumonia 12/28/2020   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 LIMA to LAD, SVG to D1, OM1 and RPDA   CORONARY ARTERY BYPASS GRAFT  03/1998   5 vessel, [facility]   RIGHT HEART CATH N/A 04/04/2019   Procedure: RIGHT HEART CATH;  Surgeon: Wenona Hamilton, MD;  Location: [facility] INVASIVE CV LAB;  Service: Cardiovascular;  Laterality: N/A;   RIGHT/LEFT HEART CATH AND CORONARY ANGIOGRAPHY N/A 02/01/2018   Procedure: RIGHT/LEFT HEART CATH AND CORONARY ANGIOGRAPHY;  Surgeon: Wenona Hamilton, MD;  Location: [facility] INVASIVE CV LAB;  Service: Cardiovascular;  Laterality: N/A;   Patient Active Problem List   Diagnosis Date Noted   Diverticulitis 07/26/2023   RSV (respiratory syncytial virus pneumonia)  07/26/2023   Impaired ambulation 07/02/2023   Orthostatic hypotension 05/22/2023   Generalized weakness 05/07/2021   Bradycardia    Anemia, unspecified 02/07/2021   Neutropenia (HCC) 01/29/2021   Thrombocytopenia (HCC) 01/29/2021   Right lower lobe pneumonia 12/28/2020   Hyponatremia 12/22/2020   Bilateral leg weakness 12/20/2020   Prostate cancer screening 09/08/2020   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) 09/06/2020   Low back pain 08/19/2019   Pulmonary hypertension (HCC)    Insomnia 12/21/2018   Sleep apnea in adult 12/09/2018   Periodic limb movement disorder 12/09/2018   Pulmonary nodules 05/18/2018   Wears hearing aid in both ears 05/17/2018   Leg pain, bilateral 02/20/2018   Dyspnea    CKD stage 3a, GFR 45-59 ml/min (HCC) 09/21/2017   History of skin cancer in adulthood 08/14/2016   Parkinson's disease (HCC) 08/09/2016   Bilateral carotid artery stenosis 04/02/2015   Vertigo, peripheral 10/17/2014   Benign prostatic hyperplasia with urinary frequency 01/31/2014   Obesity 04/03/2013   Other malaise and fatigue 09/21/2012   Hyperlipidemia    Essential hypertension    3-vessel coronary artery disease    S/P CABG x 5     ONSET DATE: 07/03/23  REFERRING DIAG: Q65.784 (ICD-10-CM) - Weakness of both lower extremities  THERAPY DIAG:  Unsteadiness on feet  Difficulty in walking, not elsewhere classified  Other abnormalities of gait and mobility  Abnormality of gait and mobility  Muscle weakness (generalized)  Rationale for Evaluation and Treatment: Rehabilitation  SUBJECTIVE:                                                                                                                                                                                             SUBJECTIVE STATEMENT:  Patient reports no changes. No falls or LOB.   Pt accompanied by: significant other  PERTINENT HISTORY:  From prior PT evaluation:  PD, history of  COVID and significant weakness present following this diagnosis and the use of paxlovid ( August 5 dx date, thinks Paxlovid may have exacerbated PD symptoms)    Pt reports having COVID and having significant weakness.  Patient reports he also has back pain that is not present for a long time but was exacerbated more recently.  Patient previously did physical therapy for his back and experienced some relief but has not been consistent with the exercises.  Patient also has history of going to Parkinson's rock steady classes multiple times per week prior to onset of his COVID but he has not been back since. Patient previously ambulated without an assistive device but is now ambulating with a straight point cane.  Patient reports increased foot and ankle weakness in comparison with his hips and knees.  Patient also reports low back pain that is exacerbated with prolonged standing.  PAIN:  Are you having pain? No  PRECAUTIONS: Fall  RED FLAGS: None   WEIGHT BEARING RESTRICTIONS: No  FALLS: Has patient fallen in last 6 months? Yes. Number of falls 6  LIVING ENVIRONMENT: Lives with: lives with their spouse Lives in: House/apartment Stairs: Yes: Internal: 15 steps; on left going up and External: 1 steps; none Has following equipment at home: Quad cane large base and Walker - 2 wheeled  PLOF: Independent  PATIENT GOALS: to improve overall strength, get back to walking more regularly  OBJECTIVE:  Note: Objective measures were completed at Evaluation unless otherwise noted.  DIAGNOSTIC FINDINGS:   EXAM: CT HEAD WITHOUT CONTRAST  IMPRESSION: 1. No acute intracranial process. 2. Mild chronic small vessel ischemic changes. 3. Acute right frontal and maxillary sinusitis.  COGNITION: Overall cognitive status: Within functional limits for tasks assessed   SENSATION: WFL  COORDINATION: WFL    LOWER EXTREMITY ROM:     Active  Right Eval Left Eval  Hip flexion    Hip extension     Hip abduction    Hip adduction  Hip internal rotation    Hip external rotation    Knee flexion    Knee extension    Ankle dorsiflexion    Ankle plantarflexion    Ankle inversion    Ankle eversion     (Blank rows = not tested)  LOWER EXTREMITY MMT:    MMT Right Eval Left Eval  Hip flexion 4 4  Hip abduction 4 4  Hip adduction 4 4  Knee flexion 3+ 3+  Knee extension 4- 4-  Ankle dorsiflexion 4+ 4  (Blank rows = not tested)  BED MOBILITY:  No limitations according to the pt.  TRANSFERS: Assistive device utilized: None  Sit to stand: Complete Independence Stand to sit: Complete Independence Chair to chair: Complete Independence Floor:  Not tested  FUNCTIONAL TESTS:  5 times sit to stand: 15.03 sec Timed up and go (TUG): 13.38 sec 6 minute walk test: TBD 10 meter walk test: 12.15 sec; 0.82 m/s Dynamic Gait Index: TBD  PATIENT SURVEYS:  ABC scale 70.6%                                                                                                                              TREATMENT DATE: 09/24/23  NMR  Standing PWR! Moves on blue airex beam  - up, rock, twist, step x 10 ea   Ladder drills  Forward x 1 foot in ea x 8 times  Lateral 2 feet in ea x 6 reps  Facing perpiindicular to long axis of ladder l2 feet in then retro step out lateral movement   TA  Octane leg press mode x 7 min ( intervals to challenge LE strength and endurance)    TE  Leg press  62.5# 3 x 10 reps    PATIENT EDUCATION: Education details: Pt educated throughout session about proper posture and technique with exercises. Improved exercise technique, movement at target joints, use of target muscles after min to mod verbal, visual, tactile cues  Person educated: Patient and Spouse Education method: Explanation, Demonstration, and Verbal cues Education comprehension: verbalized understanding  HOME EXERCISE PROGRAM: Seated PWR! Moves x 10 ea, handout provided   GOALS: Goals  reviewed with patient? Yes  SHORT TERM GOALS: Target date: 09/14/2023    Pt will be independent with HEP in order to demonstrate increased ability to perform tasks related to occupation/hobbies. Baseline:  Pt to be given HEP at next visit. Goal status: INITIAL  LONG TERM GOALS: Target date: 11/09/2023  1.  Patient (> 51 years old) will complete five times sit to stand test in < 15 seconds indicating an increased LE strength and improved balance. Baseline: 15.03 sec 4/14: 11.79 sec Goal status: INITIAL  2.  Patient will increase their ABC scale score to be 80.6% (an increase of 10 points), demonstrating improved balance confidence and reduced fall risk, as measured through weekly assessments. Baseline: 70.6% 4/14:72 % Goal status: INITIAL   3.  Patient will increase Programmer, systems  score by > 6 points to demonstrate decreased fall risk during functional activities. Baseline: 43 on 3/13 4/14:  Goal status: INITIAL   4.  Patient will reduce timed up and go to <11 seconds to reduce fall risk and demonstrate improved transfer/gait ability. Baseline: 13.38 sec 4/14: 11.4 sec no AD Goal status: INITIAL  5.  Patient will increase 10 meter walk test to >1.81m/s as to improve gait speed for better community ambulation and to reduce fall risk. Baseline: 12.15 sec; 0.82 m/s Goal status: INITIAL  6.  Patient will increase six minute walk test distance to >1000 for progression to community ambulator and improve gait ability Baseline: 459 ft with RW stopping at 5:30 due to fatigue  09/21/23:1128 ft with 4WW Goal status: INITIAL   ASSESSMENT:  CLINICAL IMPRESSION:  Patient arrived with good motivation for completion of pt activities.   Progressed with strengthening and higher level balance intervention this date. Patient recommended to go back to [facility] steady boxing now that he is functioning closer to what he was prior to his hospitalization.  Patient still making progress toward his goals and will  continue to benefit from skilled physical therapy to further improve his strength, mobility, and decrease his risk for falls. Patient's condition has the potential to improve in response to therapy. Maximum improvement is yet to be obtained. The anticipated improvement is attainable and reasonable in a generally predictable time.     OBJECTIVE IMPAIRMENTS: Abnormal gait, decreased activity tolerance, decreased balance, decreased endurance, decreased knowledge of use of DME, decreased mobility, difficulty walking, decreased ROM, decreased strength, and decreased safety awareness.   ACTIVITY LIMITATIONS: carrying, lifting, bending, standing, squatting, bathing, and locomotion level  PARTICIPATION LIMITATIONS: cleaning, laundry, driving, shopping, community activity, and yard work  PERSONAL FACTORS: Age, Education, Past/current experiences, Time since onset of injury/illness/exacerbation, and 3+ comorbidities: orthostatic HTN, syncope, falls, anemia, insomnia, dyspnea  are also affecting patient's functional outcome.   REHAB POTENTIAL: Fair pt has been seen in the past.  CLINICAL DECISION MAKING: Evolving/moderate complexity  EVALUATION COMPLEXITY: Moderate  PLAN:  PT FREQUENCY: 2x/week  PT DURATION: 12 weeks  PLANNED INTERVENTIONS: 97110-Therapeutic exercises, 97530- Therapeutic activity, 97112- Neuromuscular re-education, 97535- Self Care, 16109- Manual therapy, [phone]- Gait training, Balance training, Stair training, and Vestibular training  PLAN FOR NEXT SESSION: Balance and strength interventions PD specific interventions as indicated    Edwina Gram PT ,DPT Physical Therapist- [facility]  [facility]  09/24/23, 1:41 PM

## 2023-09-28 ENCOUNTER — Ambulatory Visit: Payer: Medicare Other | Admitting: Physical Therapy

## 2023-09-29 ENCOUNTER — Ambulatory Visit: Payer: Medicare Other | Admitting: Physical Therapy

## 2023-09-29 ENCOUNTER — Ambulatory Visit: Admitting: Physical Therapy

## 2023-09-29 DIAGNOSIS — R269 Unspecified abnormalities of gait and mobility: Secondary | ICD-10-CM

## 2023-09-29 DIAGNOSIS — M6281 Muscle weakness (generalized): Secondary | ICD-10-CM

## 2023-09-29 DIAGNOSIS — R262 Difficulty in walking, not elsewhere classified: Secondary | ICD-10-CM | POA: Diagnosis not present

## 2023-09-29 DIAGNOSIS — R278 Other lack of coordination: Secondary | ICD-10-CM | POA: Diagnosis not present

## 2023-09-29 DIAGNOSIS — R2681 Unsteadiness on feet: Secondary | ICD-10-CM | POA: Diagnosis not present

## 2023-09-29 DIAGNOSIS — R2689 Other abnormalities of gait and mobility: Secondary | ICD-10-CM

## 2023-09-29 DIAGNOSIS — M5459 Other low back pain: Secondary | ICD-10-CM

## 2023-09-29 DIAGNOSIS — R29898 Other symptoms and signs involving the musculoskeletal system: Secondary | ICD-10-CM

## 2023-09-29 NOTE — Therapy (Signed)
 OUTPATIENT PHYSICAL THERAPY NEURO TREATMENT    Patient Name: Johnathan Arnold. MRN: [id number] DOB:05-Sep-1945, 78 y.o., male Today's Date: 09/29/2023  PCP: Thersia Flax, MD  REFERRING PROVIDER: Thersia Flax, MD   END OF SESSION:  PT End of Session - 09/29/23 1133     Visit Number 12    Number of Visits 24    Date for PT Re-Evaluation 11/09/23    Authorization Type Medicare, BCBS supplement    Progress Note Due on Visit 20    PT Start Time 1146    PT Stop Time 1226    PT Time Calculation (min) 40 min    Equipment Utilized During Treatment Gait belt    Activity Tolerance Patient tolerated treatment well    Behavior During Therapy 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 LIMA to LAD, SVG to D1, OM1 and RPDA   CORONARY ARTERY BYPASS GRAFT  03/1998   5 vessel, [facility]   RIGHT HEART CATH N/A 04/04/2019   Procedure: RIGHT HEART CATH;  Surgeon: Wenona Hamilton, MD;  Location: [facility] INVASIVE CV LAB;  Service: Cardiovascular;  Laterality: N/A;   RIGHT/LEFT HEART CATH AND CORONARY ANGIOGRAPHY N/A 02/01/2018   Procedure: RIGHT/LEFT HEART CATH AND CORONARY ANGIOGRAPHY;  Surgeon: Wenona Hamilton, MD;  Location: [facility] INVASIVE CV LAB;  Service: Cardiovascular;  Laterality: N/A;   Patient Active Problem List   Diagnosis Date Noted   Diverticulitis 07/26/2023   RSV (respiratory syncytial virus pneumonia)  07/26/2023   Impaired ambulation 07/02/2023   Orthostatic hypotension 05/22/2023   Generalized weakness 05/07/2021   Bradycardia    Anemia, unspecified 02/07/2021   Neutropenia (HCC) 01/29/2021   Thrombocytopenia (HCC) 01/29/2021   Right lower lobe pneumonia 12/28/2020   Hyponatremia 12/22/2020   Bilateral leg weakness 12/20/2020   Prostate cancer screening 09/08/2020   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) 09/06/2020   Low back pain 08/19/2019   Pulmonary hypertension (HCC)    Insomnia 12/21/2018   Sleep apnea in adult 12/09/2018   Periodic limb movement disorder 12/09/2018   Pulmonary nodules 05/18/2018   Wears hearing aid in both ears 05/17/2018   Leg pain, bilateral 02/20/2018   Dyspnea    CKD stage 3a, GFR 45-59 ml/min (HCC) 09/21/2017   History of skin cancer in adulthood 08/14/2016   Parkinson's disease (HCC) 08/09/2016   Bilateral carotid artery stenosis 04/02/2015   Vertigo, peripheral 10/17/2014   Benign prostatic hyperplasia with urinary frequency 01/31/2014   Obesity 04/03/2013   Other malaise and fatigue 09/21/2012   Hyperlipidemia    Essential hypertension    3-vessel coronary artery disease    S/P CABG x 5     ONSET DATE: 07/03/23  REFERRING DIAG: W09.811 (ICD-10-CM) - Weakness of both lower extremities  THERAPY DIAG:  Unsteadiness on feet  Difficulty in walking, not elsewhere classified  Other abnormalities of gait and mobility  Abnormality of gait and mobility  Muscle weakness (generalized)  Other lack of coordination  Other low back pain  Weakness of both hips  Rationale for Evaluation and Treatment: Rehabilitation  SUBJECTIVE:                                                                                                                                                                                             SUBJECTIVE STATEMENT:  Patient states that BLE are staying sore all the time rated 4/10 within  therapy session, and 7/10 at worst. Also reports that he is  having a hard time sleeping, despite taking melatonin at night.  Reports that he wakes up to use the restroom and for other reasons every hour or 2 until about 1 in the morning, and then can only sleep for about 4 hours at a time.   States that he restarted RockSteady Boxing last week on Wednesday last week and Monday of this week; is planning at going back again on Wednesday. Reports that the class size has almost doubled since he stopped going in January.   Pt accompanied by: significant other  PERTINENT HISTORY:  From prior PT evaluation:  PD, history of COVID and significant weakness present following this diagnosis and the use of paxlovid ( August 5 dx date, thinks Paxlovid may have exacerbated PD symptoms)    Pt reports having COVID and having significant weakness.  Patient reports he also has back pain that is not present for a long time but was exacerbated more recently.  Patient previously did physical therapy for his back and experienced some relief but has not been consistent with the exercises.  Patient also has history of going to Parkinson's rock steady classes multiple times per week prior to onset of his COVID but he has not been back since. Patient previously ambulated without an assistive device but is now ambulating with a straight point cane.  Patient reports increased foot and ankle weakness in comparison with his hips and knees.  Patient also reports low back pain that is exacerbated with prolonged standing.  PAIN:  Are you having pain? No  PRECAUTIONS: Fall  RED FLAGS: None   WEIGHT BEARING RESTRICTIONS: No  FALLS: Has patient fallen in last 6 months? Yes. Number of falls 6  LIVING ENVIRONMENT: Lives with: lives with their spouse Lives in: House/apartment Stairs: Yes: Internal: 15 steps; on left going up and External: 1 steps; none Has following equipment at home: Quad cane large base and Walker -  2  wheeled  PLOF: Independent  PATIENT GOALS: to improve overall strength, get back to walking more regularly  OBJECTIVE:  Note: Objective measures were completed at Evaluation unless otherwise noted.  DIAGNOSTIC FINDINGS:   EXAM: CT HEAD WITHOUT CONTRAST  IMPRESSION: 1. No acute intracranial process. 2. Mild chronic small vessel ischemic changes. 3. Acute right frontal and maxillary sinusitis.  COGNITION: Overall cognitive status: Within functional limits for tasks assessed   SENSATION: WFL  COORDINATION: WFL    LOWER EXTREMITY ROM:     Active  Right Eval Left Eval  Hip flexion    Hip extension    Hip abduction    Hip adduction    Hip internal rotation    Hip external rotation    Knee flexion    Knee extension    Ankle dorsiflexion    Ankle plantarflexion    Ankle inversion    Ankle eversion     (Blank rows = not tested)  LOWER EXTREMITY MMT:    MMT Right Eval Left Eval  Hip flexion 4 4  Hip abduction 4 4  Hip adduction 4 4  Knee flexion 3+ 3+  Knee extension 4- 4-  Ankle dorsiflexion 4+ 4  (Blank rows = not tested)  BED MOBILITY:  No limitations according to the pt.  TRANSFERS: Assistive device utilized: None  Sit to stand: Complete Independence Stand to sit: Complete Independence Chair to chair: Complete Independence Floor:  Not tested  FUNCTIONAL TESTS:  5 times sit to stand: 15.03 sec Timed up and go (TUG): 13.38 sec 6 minute walk test: TBD 10 meter walk test: 12.15 sec; 0.82 m/s Dynamic Gait Index: TBD  PATIENT SURVEYS:  ABC scale 70.6%                                                                                                                              TREATMENT DATE: 09/29/23  TA  Octane auto x 8 min ( intervals to challenge LE strength and endurance) level 1-6.  Forward/reverse gait 82ft x 4 Gait with 360deg turn around 4 cones x2 turning R, x 2 turning L  Side stepping without UE support 93ft x 3.    NMR  Standing PWR! Moves on level surface this day.  - up, rock, twist, step x 10 ea  Y stepping over tape x 5 CW, x 5CCW. Cues for step length and posture. Gait with 2# AW on BUE x 329ft with tactile cues for improved reciprocal UE swing and trunkal rotation. Able to sustain in straight line, but required continued instruction in turns.    PATIENT EDUCATION: Education details: Pt educated throughout session about proper posture and technique with exercises. Improved exercise technique, movement at target joints, use of target muscles after min to mod verbal, visual, tactile cues  Person educated: Patient and Spouse Education method: Explanation, Demonstration, and Verbal cues Education comprehension: verbalized understanding  HOME EXERCISE PROGRAM: Seated PWR! Moves x 10 ea,  handout provided   GOALS: Goals reviewed with patient? Yes  SHORT TERM GOALS: Target date: 09/14/2023    Pt will be independent with HEP in order to demonstrate increased ability to perform tasks related to occupation/hobbies. Baseline:  Pt to be given HEP at next visit. Goal status: INITIAL  LONG TERM GOALS: Target date: 11/09/2023  1.  Patient (> 104 years old) will complete five times sit to stand test in < 15 seconds indicating an increased LE strength and improved balance. Baseline: 15.03 sec 4/14: 11.79 sec Goal status: INITIAL  2.  Patient will increase their ABC scale score to be 80.6% (an increase of 10 points), demonstrating improved balance confidence and reduced fall risk, as measured through weekly assessments. Baseline: 70.6% 4/14:72 % Goal status: INITIAL   3.  Patient will increase Berg Balance score by > 6 points to demonstrate decreased fall risk during functional activities. Baseline: 43 on 3/13 4/14:  Goal status: INITIAL   4.  Patient will reduce timed up and go to <11 seconds to reduce fall risk and demonstrate improved transfer/gait ability. Baseline: 13.38 sec 4/14: 11.4 sec no  AD Goal status: INITIAL  5.  Patient will increase 10 meter walk test to >1.28m/s as to improve gait speed for better community ambulation and to reduce fall risk. Baseline: 12.15 sec; 0.82 m/s Goal status: INITIAL  6.  Patient will increase six minute walk test distance to >1000 for progression to community ambulator and improve gait ability Baseline: 459 ft with RW stopping at 5:30 due to fatigue  09/21/23:1128 ft with 4WW Goal status: INITIAL   ASSESSMENT:  CLINICAL IMPRESSION:  Patient arrived with good motivation for completion of pt activities.  Pt showed continued improvement with ambulatory endurance and fatigue.  Pt still showing improved tolerance with gait without AD and no complaint of LBP with moderate distances as well as continuation of PWR standing activities. Continuing to progress with each session with reps, resistance, intensity and overall volume. Encouraged pt of progress with ambulatory activities and balance as well as reciprocal  movement pattern when no AD in use. Pt will continue to benefit from skilled physical therapy intervention to address impairments, improve QOL, and attain therapy goals.    OBJECTIVE IMPAIRMENTS: Abnormal gait, decreased activity tolerance, decreased balance, decreased endurance, decreased knowledge of use of DME, decreased mobility, difficulty walking, decreased ROM, decreased strength, and decreased safety awareness.   ACTIVITY LIMITATIONS: carrying, lifting, bending, standing, squatting, bathing, and locomotion level  PARTICIPATION LIMITATIONS: cleaning, laundry, driving, shopping, community activity, and yard work  PERSONAL FACTORS: Age, Education, Past/current experiences, Time since onset of injury/illness/exacerbation, and 3+ comorbidities: orthostatic HTN, syncope, falls, anemia, insomnia, dyspnea  are also affecting patient's functional outcome.   REHAB POTENTIAL: Fair pt has been seen in the past.  CLINICAL DECISION MAKING:  Evolving/moderate complexity  EVALUATION COMPLEXITY: Moderate  PLAN:  PT FREQUENCY: 2x/week  PT DURATION: 12 weeks  PLANNED INTERVENTIONS: 97110-Therapeutic exercises, 97530- Therapeutic activity, 97112- Neuromuscular re-education, 97535- Self Care, 16109- Manual therapy, [phone]- Gait training, Balance training, Stair training, and Vestibular training  PLAN FOR NEXT SESSION:   Balance and strength interventions PD specific interventions as indicated    Barbara Book PT ,DPT Physical Therapist- [facility]  [facility]  09/29/23, 11:34 AM

## 2023-10-01 ENCOUNTER — Ambulatory Visit: Payer: Medicare Other | Admitting: Physical Therapy

## 2023-10-01 DIAGNOSIS — M6281 Muscle weakness (generalized): Secondary | ICD-10-CM

## 2023-10-01 DIAGNOSIS — R262 Difficulty in walking, not elsewhere classified: Secondary | ICD-10-CM | POA: Diagnosis not present

## 2023-10-01 DIAGNOSIS — R2689 Other abnormalities of gait and mobility: Secondary | ICD-10-CM | POA: Diagnosis not present

## 2023-10-01 DIAGNOSIS — R2681 Unsteadiness on feet: Secondary | ICD-10-CM

## 2023-10-01 DIAGNOSIS — R269 Unspecified abnormalities of gait and mobility: Secondary | ICD-10-CM

## 2023-10-01 DIAGNOSIS — R278 Other lack of coordination: Secondary | ICD-10-CM | POA: Diagnosis not present

## 2023-10-01 NOTE — Therapy (Signed)
 OUTPATIENT PHYSICAL THERAPY NEURO TREATMENT    Patient Name: Johnathan Arnold. MRN: [id number] DOB:07-02-1945, 78 y.o., male Today's Date: 10/01/2023  PCP: Thersia Flax, MD  REFERRING PROVIDER: Thersia Flax, MD   END OF SESSION:  PT End of Session - 10/01/23 1301     Visit Number 13    Number of Visits 24    Date for PT Re-Evaluation 11/09/23    Authorization Type Medicare, BCBS supplement    Progress Note Due on Visit 20    PT Start Time 1055    PT Stop Time 1135    PT Time Calculation (min) 40 min    Equipment Utilized During Treatment Gait belt    Activity Tolerance Patient tolerated treatment well    Behavior During Therapy [facility] for tasks assessed/performed                      Past Medical History:  Diagnosis Date   3-vessel coronary artery disease    s/p  5 vessel CABG   Allergy June 2024   Diabetes mellitus without complication (HCC)    History of cardiac catheterization 2011   [facility]   Hyperlipidemia    Hypertension    Hypertriglyceridemia    Neuromuscular disorder [facility]) October 2017   Diagonis Parkinson   Parkinson's disease [facility])    Pneumonia 12/28/2020   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 LIMA to LAD, SVG to D1, OM1 and RPDA   CORONARY ARTERY BYPASS GRAFT  03/1998   5 vessel, [facility]   RIGHT HEART CATH N/A 04/04/2019   Procedure: RIGHT HEART CATH;  Surgeon: Wenona Hamilton, MD;  Location: [facility] INVASIVE CV LAB;  Service: Cardiovascular;  Laterality: N/A;   RIGHT/LEFT HEART CATH AND CORONARY ANGIOGRAPHY N/A 02/01/2018   Procedure: RIGHT/LEFT HEART CATH AND CORONARY ANGIOGRAPHY;  Surgeon: Wenona Hamilton, MD;  Location: [facility] INVASIVE CV LAB;  Service: Cardiovascular;  Laterality: N/A;   Patient Active Problem List   Diagnosis Date Noted   Diverticulitis 07/26/2023   RSV (respiratory syncytial virus pneumonia)  07/26/2023   Impaired ambulation 07/02/2023   Orthostatic hypotension 05/22/2023   Generalized weakness 05/07/2021   Bradycardia    Anemia, unspecified 02/07/2021   Neutropenia (HCC) 01/29/2021   Thrombocytopenia (HCC) 01/29/2021   Right lower lobe pneumonia 12/28/2020   Hyponatremia 12/22/2020   Bilateral leg weakness 12/20/2020   Prostate cancer screening 09/08/2020   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) 09/06/2020   Low back pain 08/19/2019   Pulmonary hypertension (HCC)    Insomnia 12/21/2018   Sleep apnea in adult 12/09/2018   Periodic limb movement disorder 12/09/2018   Pulmonary nodules 05/18/2018   Wears hearing aid in both ears 05/17/2018   Leg pain, bilateral 02/20/2018   Dyspnea    CKD stage 3a, GFR 45-59 ml/min (HCC) 09/21/2017   History of skin cancer in adulthood 08/14/2016   Parkinson's disease (HCC) 08/09/2016   Bilateral carotid artery stenosis 04/02/2015   Vertigo, peripheral 10/17/2014   Benign prostatic hyperplasia with urinary frequency 01/31/2014   Obesity 04/03/2013   Other malaise and fatigue 09/21/2012   Hyperlipidemia    Essential hypertension    3-vessel coronary artery disease    S/P CABG x 5     ONSET DATE: 07/03/23  REFERRING DIAG: W09.811 (ICD-10-CM) - Weakness of both lower  extremities   THERAPY DIAG:  Unsteadiness on feet  Difficulty in walking, not elsewhere classified  Other abnormalities of gait and mobility  Abnormality of gait and mobility  Muscle weakness (generalized)  Rationale for Evaluation and Treatment: Rehabilitation  SUBJECTIVE:                                                                                                                                                                                             SUBJECTIVE STATEMENT:  Pt reports he is tired and sore but has been going to rock steady. Encouraged to use adequate rest as needed. No falls or LOB.   Pt accompanied by:  significant other  PERTINENT HISTORY:  From prior PT evaluation:  PD, history of COVID and significant weakness present following this diagnosis and the use of paxlovid ( August 5 dx date, thinks Paxlovid may have exacerbated PD symptoms)    Pt reports having COVID and having significant weakness.  Patient reports he also has back pain that is not present for a long time but was exacerbated more recently.  Patient previously did physical therapy for his back and experienced some relief but has not been consistent with the exercises.  Patient also has history of going to Parkinson's rock steady classes multiple times per week prior to onset of his COVID but he has not been back since. Patient previously ambulated without an assistive device but is now ambulating with a straight point cane.  Patient reports increased foot and ankle weakness in comparison with his hips and knees.  Patient also reports low back pain that is exacerbated with prolonged standing.  PAIN:  Are you having pain? No  PRECAUTIONS: Fall  RED FLAGS: None   WEIGHT BEARING RESTRICTIONS: No  FALLS: Has patient fallen in last 6 months? Yes. Number of falls 6  LIVING ENVIRONMENT: Lives with: lives with their spouse Lives in: House/apartment Stairs: Yes: Internal: 15 steps; on left going up and External: 1 steps; none Has following equipment at home: Quad cane large base and Walker - 2 wheeled  PLOF: Independent  PATIENT GOALS: to improve overall strength, get back to walking more regularly  OBJECTIVE:  Note: Objective measures were completed at Evaluation unless otherwise noted.  DIAGNOSTIC FINDINGS:   EXAM: CT HEAD WITHOUT CONTRAST  IMPRESSION: 1. No acute intracranial process. 2. Mild chronic small vessel ischemic changes. 3. Acute right frontal and maxillary sinusitis.  COGNITION: Overall cognitive status: Within functional limits for tasks assessed   SENSATION: WFL  COORDINATION: WFL    LOWER  EXTREMITY ROM:     Active  Right Eval Left Eval  Hip flexion    Hip extension    Hip abduction    Hip adduction    Hip internal rotation    Hip external rotation    Knee flexion    Knee extension    Ankle dorsiflexion    Ankle plantarflexion    Ankle inversion    Ankle eversion     (Blank rows = not tested)  LOWER EXTREMITY MMT:    MMT Right Eval Left Eval  Hip flexion 4 4  Hip abduction 4 4  Hip adduction 4 4  Knee flexion 3+ 3+  Knee extension 4- 4-  Ankle dorsiflexion 4+ 4  (Blank rows = not tested)  BED MOBILITY:  No limitations according to the pt.  TRANSFERS: Assistive device utilized: None  Sit to stand: Complete Independence Stand to sit: Complete Independence Chair to chair: Complete Independence Floor:  Not tested  FUNCTIONAL TESTS:  5 times sit to stand: 15.03 sec Timed up and go (TUG): 13.38 sec 6 minute walk test: TBD 10 meter walk test: 12.15 sec; 0.82 m/s Dynamic Gait Index: TBD  PATIENT SURVEYS:  ABC scale 70.6%                                                                                                                              TREATMENT DATE: 10/01/23  TA  Octane auto x  6 min ( intervals to challenge LE strength and endurance) level 1 2 min, leg press mode x 4 min   Activity Description: walking pod to pod in diamond shape with focus on turns, coordination and acceleration  Activity Setting:  random Number of Pods:  4 Cycles/Sets:  3 Duration (Time or Hit Count):  15  Patient Stats  Reaction Time:  2:02, 1:31, 1:28 ( total time)   Activity Description: sidestepping in line of 4 pods for speed and reaction time  Activity Setting:  random Number of Pods:  4 Cycles/Sets:  2 Duration (Time or Hit Count):  15  Patient Stats  Reaction Time:  1:54 sec , 1:27 sec    NMR  Standing PWR! Moves airex beam for balance challenge  - up, rock, twist, step x 10 ea   1/2 foam ant step over with reciprocal large arm swing with  1.5# wrist weights donned x 10 ea side      PATIENT EDUCATION: Education details: Pt educated throughout session about proper posture and technique with exercises. Improved exercise technique, movement at target joints, use of target muscles after min to mod verbal, visual, tactile cues  Person educated: Patient and Spouse Education method: Explanation, Demonstration, and Verbal cues Education comprehension: verbalized understanding  HOME EXERCISE PROGRAM: Seated PWR! Moves x 10 ea, handout provided   GOALS: Goals reviewed with patient? Yes  SHORT TERM GOALS: Target date: 09/14/2023    Pt will be independent with HEP in order to demonstrate increased ability to perform tasks related to occupation/hobbies. Baseline:  Pt to be  given HEP at next visit. Goal status: INITIAL  LONG TERM GOALS: Target date: 11/09/2023  1.  Patient (> 30 years old) will complete five times sit to stand test in < 15 seconds indicating an increased LE strength and improved balance. Baseline: 15.03 sec 4/14: 11.79 sec Goal status: INITIAL  2.  Patient will increase their ABC scale score to be 80.6% (an increase of 10 points), demonstrating improved balance confidence and reduced fall risk, as measured through weekly assessments. Baseline: 70.6% 4/14:72 % Goal status: INITIAL   3.  Patient will increase Berg Balance score by > 6 points to demonstrate decreased fall risk during functional activities. Baseline: 43 on 3/13 4/14:  Goal status: INITIAL   4.  Patient will reduce timed up and go to <11 seconds to reduce fall risk and demonstrate improved transfer/gait ability. Baseline: 13.38 sec 4/14: 11.4 sec no AD Goal status: INITIAL  5.  Patient will increase 10 meter walk test to >1.73m/s as to improve gait speed for better community ambulation and to reduce fall risk. Baseline: 12.15 sec; 0.82 m/s Goal status: INITIAL  6.  Patient will increase six minute walk test distance to >1000 for progression to  community ambulator and improve gait ability Baseline: 459 ft with RW stopping at 5:30 due to fatigue  09/21/23:1128 ft with 4WW Goal status: INITIAL   ASSESSMENT:  CLINICAL IMPRESSION:  Patient arrived with good motivation for completion of pt activities.  Pt challenged with various ambulatory interventions focusing on direction change, reactions and balance. Pt also progresses with use of arm swing with interventions. Pt overall exercise load increasing with participation in rock steady boxing.  Pt will continue to benefit from skilled physical therapy intervention to address impairments, improve QOL, and attain therapy goals.   OBJECTIVE IMPAIRMENTS: Abnormal gait, decreased activity tolerance, decreased balance, decreased endurance, decreased knowledge of use of DME, decreased mobility, difficulty walking, decreased ROM, decreased strength, and decreased safety awareness.   ACTIVITY LIMITATIONS: carrying, lifting, bending, standing, squatting, bathing, and locomotion level  PARTICIPATION LIMITATIONS: cleaning, laundry, driving, shopping, community activity, and yard work  PERSONAL FACTORS: Age, Education, Past/current experiences, Time since onset of injury/illness/exacerbation, and 3+ comorbidities: orthostatic HTN, syncope, falls, anemia, insomnia, dyspnea  are also affecting patient's functional outcome.   REHAB POTENTIAL: Fair pt has been seen in the past.  CLINICAL DECISION MAKING: Evolving/moderate complexity  EVALUATION COMPLEXITY: Moderate  PLAN:  PT FREQUENCY: 2x/week  PT DURATION: 12 weeks  PLANNED INTERVENTIONS: 97110-Therapeutic exercises, 97530- Therapeutic activity, 97112- Neuromuscular re-education, 97535- Self Care, 16109- Manual therapy, [phone]- Gait training, Balance training, Stair training, and Vestibular training  PLAN FOR NEXT SESSION:   Balance and strength interventions PD specific interventions as indicated   Edwina Gram PT ,DPT Physical  Therapist- [facility]  [facility]  10/01/23, 1:02 PM

## 2023-10-05 ENCOUNTER — Ambulatory Visit: Payer: Medicare Other | Admitting: Physical Therapy

## 2023-10-05 DIAGNOSIS — R2689 Other abnormalities of gait and mobility: Secondary | ICD-10-CM

## 2023-10-05 DIAGNOSIS — R2681 Unsteadiness on feet: Secondary | ICD-10-CM | POA: Diagnosis not present

## 2023-10-05 DIAGNOSIS — R269 Unspecified abnormalities of gait and mobility: Secondary | ICD-10-CM

## 2023-10-05 DIAGNOSIS — M6281 Muscle weakness (generalized): Secondary | ICD-10-CM

## 2023-10-05 DIAGNOSIS — R278 Other lack of coordination: Secondary | ICD-10-CM | POA: Diagnosis not present

## 2023-10-05 DIAGNOSIS — R262 Difficulty in walking, not elsewhere classified: Secondary | ICD-10-CM | POA: Diagnosis not present

## 2023-10-05 NOTE — Therapy (Signed)
 OUTPATIENT PHYSICAL THERAPY NEURO TREATMENT    Patient Name: Johnathan Arnold. MRN: [id number] DOB:Oct 07, 1945, 78 y.o., male Today's Date: 10/05/2023  PCP: Thersia Flax, MD  REFERRING PROVIDER: Thersia Flax, MD   END OF SESSION:  PT End of Session - 10/05/23 1325     Visit Number 14    Number of Visits 24    Date for PT Re-Evaluation 11/09/23    Authorization Type Medicare, BCBS supplement    Progress Note Due on Visit 20    PT Start Time 1320    PT Stop Time 1358    PT Time Calculation (min) 38 min    Equipment Utilized During Treatment Gait belt    Activity Tolerance Patient tolerated treatment well    Behavior During Therapy [facility] for tasks assessed/performed                      Past Medical History:  Diagnosis Date   3-vessel coronary artery disease    s/p  5 vessel CABG   Allergy June 2024   Diabetes mellitus without complication (HCC)    History of cardiac catheterization 2011   [facility]   Hyperlipidemia    Hypertension    Hypertriglyceridemia    Neuromuscular disorder [facility]) October 2017   Diagonis Parkinson   Parkinson's disease [facility])    Pneumonia 12/28/2020   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 LIMA to LAD, SVG to D1, OM1 and RPDA   CORONARY ARTERY BYPASS GRAFT  03/1998   5 vessel, [facility]   RIGHT HEART CATH N/A 04/04/2019   Procedure: RIGHT HEART CATH;  Surgeon: Wenona Hamilton, MD;  Location: [facility] INVASIVE CV LAB;  Service: Cardiovascular;  Laterality: N/A;   RIGHT/LEFT HEART CATH AND CORONARY ANGIOGRAPHY N/A 02/01/2018   Procedure: RIGHT/LEFT HEART CATH AND CORONARY ANGIOGRAPHY;  Surgeon: Wenona Hamilton, MD;  Location: [facility] INVASIVE CV LAB;  Service: Cardiovascular;  Laterality: N/A;   Patient Active Problem List   Diagnosis Date Noted   Diverticulitis 07/26/2023   RSV (respiratory syncytial virus pneumonia)  07/26/2023   Impaired ambulation 07/02/2023   Orthostatic hypotension 05/22/2023   Generalized weakness 05/07/2021   Bradycardia    Anemia, unspecified 02/07/2021   Neutropenia (HCC) 01/29/2021   Thrombocytopenia (HCC) 01/29/2021   Right lower lobe pneumonia 12/28/2020   Hyponatremia 12/22/2020   Bilateral leg weakness 12/20/2020   Prostate cancer screening 09/08/2020   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) 09/06/2020   Low back pain 08/19/2019   Pulmonary hypertension (HCC)    Insomnia 12/21/2018   Sleep apnea in adult 12/09/2018   Periodic limb movement disorder 12/09/2018   Pulmonary nodules 05/18/2018   Wears hearing aid in both ears 05/17/2018   Leg pain, bilateral 02/20/2018   Dyspnea    CKD stage 3a, GFR 45-59 ml/min (HCC) 09/21/2017   History of skin cancer in adulthood 08/14/2016   Parkinson's disease (HCC) 08/09/2016   Bilateral carotid artery stenosis 04/02/2015   Vertigo, peripheral 10/17/2014   Benign prostatic hyperplasia with urinary frequency 01/31/2014   Obesity 04/03/2013   Other malaise and fatigue 09/21/2012   Hyperlipidemia    Essential hypertension    3-vessel coronary artery disease    S/P CABG x 5     ONSET DATE: 07/03/23  REFERRING DIAG: W09.811 (ICD-10-CM) - Weakness of both lower  extremities   THERAPY DIAG:  Unsteadiness on feet  Difficulty in walking, not elsewhere classified  Other abnormalities of gait and mobility  Abnormality of gait and mobility  Muscle weakness (generalized)  Rationale for Evaluation and Treatment: Rehabilitation  SUBJECTIVE:                                                                                                                                                                                             SUBJECTIVE STATEMENT:  Pt reports he is tired and sore but has been going to rock steady. Encouraged to use adequate rest as needed. No falls or LOB.   Pt accompanied by:  significant other  PERTINENT HISTORY:  From prior PT evaluation:  PD, history of COVID and significant weakness present following this diagnosis and the use of paxlovid ( August 5 dx date, thinks Paxlovid may have exacerbated PD symptoms)    Pt reports having COVID and having significant weakness.  Patient reports he also has back pain that is not present for a long time but was exacerbated more recently.  Patient previously did physical therapy for his back and experienced some relief but has not been consistent with the exercises.  Patient also has history of going to Parkinson's rock steady classes multiple times per week prior to onset of his COVID but he has not been back since. Patient previously ambulated without an assistive device but is now ambulating with a straight point cane.  Patient reports increased foot and ankle weakness in comparison with his hips and knees.  Patient also reports low back pain that is exacerbated with prolonged standing.  PAIN:  Are you having pain? No  PRECAUTIONS: Fall  RED FLAGS: None   WEIGHT BEARING RESTRICTIONS: No  FALLS: Has patient fallen in last 6 months? Yes. Number of falls 6  LIVING ENVIRONMENT: Lives with: lives with their spouse Lives in: House/apartment Stairs: Yes: Internal: 15 steps; on left going up and External: 1 steps; none Has following equipment at home: Quad cane large base and Walker - 2 wheeled  PLOF: Independent  PATIENT GOALS: to improve overall strength, get back to walking more regularly  OBJECTIVE:  Note: Objective measures were completed at Evaluation unless otherwise noted.  DIAGNOSTIC FINDINGS:   EXAM: CT HEAD WITHOUT CONTRAST  IMPRESSION: 1. No acute intracranial process. 2. Mild chronic small vessel ischemic changes. 3. Acute right frontal and maxillary sinusitis.  COGNITION: Overall cognitive status: Within functional limits for tasks assessed   SENSATION: WFL  COORDINATION: WFL    LOWER  EXTREMITY ROM:     Active  Right Eval Left Eval  Hip flexion    Hip extension    Hip abduction    Hip adduction    Hip internal rotation    Hip external rotation    Knee flexion    Knee extension    Ankle dorsiflexion    Ankle plantarflexion    Ankle inversion    Ankle eversion     (Blank rows = not tested)  LOWER EXTREMITY MMT:    MMT Right Eval Left Eval  Hip flexion 4 4  Hip abduction 4 4  Hip adduction 4 4  Knee flexion 3+ 3+  Knee extension 4- 4-  Ankle dorsiflexion 4+ 4  (Blank rows = not tested)  BED MOBILITY:  No limitations according to the pt.  TRANSFERS: Assistive device utilized: None  Sit to stand: Complete Independence Stand to sit: Complete Independence Chair to chair: Complete Independence Floor:  Not tested  FUNCTIONAL TESTS:  5 times sit to stand: 15.03 sec Timed up and go (TUG): 13.38 sec 6 minute walk test: TBD 10 meter walk test: 12.15 sec; 0.82 m/s Dynamic Gait Index: TBD  PATIENT SURVEYS:  ABC scale 70.6%                                                                                                                              TREATMENT DATE: 10/05/23  TA  Octane auto x  6 min ( intervals to challenge LE strength and endurance) level 1, 2 min, leg press mode x 4 min   NMR  Gait with 1.5# wrist weights and focus on large arm swings 3 x 150 ft   Standing boxing with focus on reciprocal UE and LE movements. 4 x 10 ea UE, had boxing gloves and 1.5# wrist weights donned   1/2 foam ant step over with reciprocal large arm swing with 1.5# wrist weights donned 2 x 10 ea side   Standing PWR! Moves airex beam for balance challenge  - up, rock, twist, step x 10 ea    PATIENT EDUCATION: Education details: Pt educated throughout session about proper posture and technique with exercises. Improved exercise technique, movement at target joints, use of target muscles after min to mod verbal, visual, tactile cues  Person educated: Patient  and Spouse Education method: Explanation, Demonstration, and Verbal cues Education comprehension: verbalized understanding  HOME EXERCISE PROGRAM: Seated PWR! Moves x 10 ea, handout provided   GOALS: Goals reviewed with patient? Yes  SHORT TERM GOALS: Target date: 09/14/2023    Pt will be independent with HEP in order to demonstrate increased ability to perform tasks related to occupation/hobbies. Baseline:  Pt to be given HEP at next visit. Goal status: INITIAL  LONG TERM GOALS: Target date: 11/09/2023  1.  Patient (> 16 years old) will complete five times sit to stand test in < 15 seconds indicating an increased LE strength and improved balance. Baseline: 15.03 sec 4/14: 11.79 sec Goal status: INITIAL  2.  Patient will increase  their ABC scale score to be 80.6% (an increase of 10 points), demonstrating improved balance confidence and reduced fall risk, as measured through weekly assessments. Baseline: 70.6% 4/14:72 % Goal status: INITIAL   3.  Patient will increase Berg Balance score by > 6 points to demonstrate decreased fall risk during functional activities. Baseline: 43 on 3/13 4/14:  Goal status: INITIAL   4.  Patient will reduce timed up and go to <11 seconds to reduce fall risk and demonstrate improved transfer/gait ability. Baseline: 13.38 sec 4/14: 11.4 sec no AD Goal status: INITIAL  5.  Patient will increase 10 meter walk test to >1.57m/s as to improve gait speed for better community ambulation and to reduce fall risk. Baseline: 12.15 sec; 0.82 m/s Goal status: INITIAL  6.  Patient will increase six minute walk test distance to >1000 for progression to community ambulator and improve gait ability Baseline: 459 ft with RW stopping at 5:30 due to fatigue  09/21/23:1128 ft with 4WW Goal status: INITIAL   ASSESSMENT:  CLINICAL IMPRESSION:  Patient arrived with good motivation for completion of pt activities.  Challenged pt to work on reciprocal UE and LE movements  with boxing and walking interventions this date.  Pt will continue to benefit from skilled physical therapy intervention to address impairments, improve QOL, and attain therapy goals.   OBJECTIVE IMPAIRMENTS: Abnormal gait, decreased activity tolerance, decreased balance, decreased endurance, decreased knowledge of use of DME, decreased mobility, difficulty walking, decreased ROM, decreased strength, and decreased safety awareness.   ACTIVITY LIMITATIONS: carrying, lifting, bending, standing, squatting, bathing, and locomotion level  PARTICIPATION LIMITATIONS: cleaning, laundry, driving, shopping, community activity, and yard work  PERSONAL FACTORS: Age, Education, Past/current experiences, Time since onset of injury/illness/exacerbation, and 3+ comorbidities: orthostatic HTN, syncope, falls, anemia, insomnia, dyspnea  are also affecting patient's functional outcome.   REHAB POTENTIAL: Fair pt has been seen in the past.  CLINICAL DECISION MAKING: Evolving/moderate complexity  EVALUATION COMPLEXITY: Moderate  PLAN:  PT FREQUENCY: 2x/week  PT DURATION: 12 weeks  PLANNED INTERVENTIONS: 97110-Therapeutic exercises, 97530- Therapeutic activity, 97112- Neuromuscular re-education, 97535- Self Care, 65784- Manual therapy, [phone]- Gait training, Balance training, Stair training, and Vestibular training  PLAN FOR NEXT SESSION:   Balance and strength interventions PD specific interventions as indicated   Edwina Gram PT ,DPT Physical Therapist- [facility]  [facility]  10/05/23, 1:26 PM

## 2023-10-08 ENCOUNTER — Ambulatory Visit: Payer: Medicare Other | Admitting: Physical Therapy

## 2023-10-12 ENCOUNTER — Ambulatory Visit: Payer: Medicare Other | Attending: Internal Medicine | Admitting: Physical Therapy

## 2023-10-12 DIAGNOSIS — M6281 Muscle weakness (generalized): Secondary | ICD-10-CM | POA: Diagnosis not present

## 2023-10-12 DIAGNOSIS — R269 Unspecified abnormalities of gait and mobility: Secondary | ICD-10-CM | POA: Insufficient documentation

## 2023-10-12 DIAGNOSIS — R262 Difficulty in walking, not elsewhere classified: Secondary | ICD-10-CM | POA: Diagnosis not present

## 2023-10-12 DIAGNOSIS — R2689 Other abnormalities of gait and mobility: Secondary | ICD-10-CM | POA: Diagnosis not present

## 2023-10-12 DIAGNOSIS — R2681 Unsteadiness on feet: Secondary | ICD-10-CM | POA: Diagnosis not present

## 2023-10-12 DIAGNOSIS — R278 Other lack of coordination: Secondary | ICD-10-CM | POA: Insufficient documentation

## 2023-10-12 NOTE — Therapy (Signed)
 OUTPATIENT PHYSICAL THERAPY NEURO TREATMENT    Patient Name: Johnathan Arnold. MRN: [id number] DOB:16-Jun-1945, 78 y.o., male Today's Date: 10/12/2023  PCP: Thersia Flax, MD  REFERRING PROVIDER: Thersia Flax, MD   END OF SESSION:  PT End of Session - 10/12/23 1149     Visit Number 15    Number of Visits 24    Date for PT Re-Evaluation 11/09/23    Authorization Type Medicare, BCBS supplement    Progress Note Due on Visit 20    PT Start Time 1146    PT Stop Time 1227    PT Time Calculation (min) 41 min    Equipment Utilized During Treatment Gait belt    Activity Tolerance Patient tolerated treatment well    Behavior During Therapy [facility] for tasks assessed/performed                      Past Medical History:  Diagnosis Date   3-vessel coronary artery disease    s/p  5 vessel CABG   Allergy June 2024   Diabetes mellitus without complication (HCC)    History of cardiac catheterization 2011   [facility]   Hyperlipidemia    Hypertension    Hypertriglyceridemia    Neuromuscular disorder [facility]) October 2017   Diagonis Parkinson   Parkinson's disease [facility])    Pneumonia 12/28/2020   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 LIMA to LAD, SVG to D1, OM1 and RPDA   CORONARY ARTERY BYPASS GRAFT  03/1998   5 vessel, [facility]   RIGHT HEART CATH N/A 04/04/2019   Procedure: RIGHT HEART CATH;  Surgeon: Wenona Hamilton, MD;  Location: [facility] INVASIVE CV LAB;  Service: Cardiovascular;  Laterality: N/A;   RIGHT/LEFT HEART CATH AND CORONARY ANGIOGRAPHY N/A 02/01/2018   Procedure: RIGHT/LEFT HEART CATH AND CORONARY ANGIOGRAPHY;  Surgeon: Wenona Hamilton, MD;  Location: [facility] INVASIVE CV LAB;  Service: Cardiovascular;  Laterality: N/A;   Patient Active Problem List   Diagnosis Date Noted   Diverticulitis 07/26/2023   RSV (respiratory syncytial virus pneumonia)  07/26/2023   Impaired ambulation 07/02/2023   Orthostatic hypotension 05/22/2023   Generalized weakness 05/07/2021   Bradycardia    Anemia, unspecified 02/07/2021   Neutropenia (HCC) 01/29/2021   Thrombocytopenia (HCC) 01/29/2021   Right lower lobe pneumonia 12/28/2020   Hyponatremia 12/22/2020   Bilateral leg weakness 12/20/2020   Prostate cancer screening 09/08/2020   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) 09/06/2020   Low back pain 08/19/2019   Pulmonary hypertension (HCC)    Insomnia 12/21/2018   Sleep apnea in adult 12/09/2018   Periodic limb movement disorder 12/09/2018   Pulmonary nodules 05/18/2018   Wears hearing aid in both ears 05/17/2018   Leg pain, bilateral 02/20/2018   Dyspnea    CKD stage 3a, GFR 45-59 ml/min (HCC) 09/21/2017   History of skin cancer in adulthood 08/14/2016   Parkinson's disease (HCC) 08/09/2016   Bilateral carotid artery stenosis 04/02/2015   Vertigo, peripheral 10/17/2014   Benign prostatic hyperplasia with urinary frequency 01/31/2014   Obesity 04/03/2013   Other malaise and fatigue 09/21/2012   Hyperlipidemia    Essential hypertension    3-vessel coronary artery disease    S/P CABG x 5     ONSET DATE: 07/03/23  REFERRING DIAG: W09.811 (ICD-10-CM) - Weakness of both lower  extremities   THERAPY DIAG:  Unsteadiness on feet  Difficulty in walking, not elsewhere classified  Other abnormalities of gait and mobility  Abnormality of gait and mobility  Muscle weakness (generalized)  Rationale for Evaluation and Treatment: Rehabilitation  SUBJECTIVE:                                                                                                                                                                                             SUBJECTIVE STATEMENT:  Pt reports doing well today. Pt denies any recent falls/stumbles since prior session. Pt denies any updates to medications or medical appointment since  prior session. Pt reports good compliance with HEP when time permits.   Pt accompanied by: significant other  PERTINENT HISTORY:  From prior PT evaluation:  PD, history of COVID and significant weakness present following this diagnosis and the use of paxlovid ( August 5 dx date, thinks Paxlovid may have exacerbated PD symptoms)    Pt reports having COVID and having significant weakness.  Patient reports he also has back pain that is not present for a long time but was exacerbated more recently.  Patient previously did physical therapy for his back and experienced some relief but has not been consistent with the exercises.  Patient also has history of going to Parkinson's rock steady classes multiple times per week prior to onset of his COVID but he has not been back since. Patient previously ambulated without an assistive device but is now ambulating with a straight point cane.  Patient reports increased foot and ankle weakness in comparison with his hips and knees.  Patient also reports low back pain that is exacerbated with prolonged standing.  PAIN:  Are you having pain? No  PRECAUTIONS: Fall  RED FLAGS: None   WEIGHT BEARING RESTRICTIONS: No  FALLS: Has patient fallen in last 6 months? Yes. Number of falls 6  LIVING ENVIRONMENT: Lives with: lives with their spouse Lives in: House/apartment Stairs: Yes: Internal: 15 steps; on left going up and External: 1 steps; none Has following equipment at home: Quad cane large base and Walker - 2 wheeled  PLOF: Independent  PATIENT GOALS: to improve overall strength, get back to walking more regularly  OBJECTIVE:  Note: Objective measures were completed at Evaluation unless otherwise noted.  DIAGNOSTIC FINDINGS:   EXAM: CT HEAD WITHOUT CONTRAST  IMPRESSION: 1. No acute intracranial process. 2. Mild chronic small vessel ischemic changes. 3. Acute right frontal and maxillary sinusitis.  COGNITION: Overall cognitive status: Within  functional limits for tasks assessed   SENSATION: WFL  COORDINATION: WFL    LOWER EXTREMITY ROM:  Active  Right Eval Left Eval  Hip flexion    Hip extension    Hip abduction    Hip adduction    Hip internal rotation    Hip external rotation    Knee flexion    Knee extension    Ankle dorsiflexion    Ankle plantarflexion    Ankle inversion    Ankle eversion     (Blank rows = not tested)  LOWER EXTREMITY MMT:    MMT Right Eval Left Eval  Hip flexion 4 4  Hip abduction 4 4  Hip adduction 4 4  Knee flexion 3+ 3+  Knee extension 4- 4-  Ankle dorsiflexion 4+ 4  (Blank rows = not tested)  BED MOBILITY:  No limitations according to the pt.  TRANSFERS: Assistive device utilized: None  Sit to stand: Complete Independence Stand to sit: Complete Independence Chair to chair: Complete Independence Floor:  Not tested  FUNCTIONAL TESTS:  5 times sit to stand: 15.03 sec Timed up and go (TUG): 13.38 sec 6 minute walk test: TBD 10 meter walk test: 12.15 sec; 0.82 m/s Dynamic Gait Index: TBD  PATIENT SURVEYS:  ABC scale 70.6%                                                                                                                              TREATMENT DATE: 10/12/23  TA  Octane auto x  6 min ( intervals to challenge LE strength and endurance) level 1, 2 min, leg press mode x 4 min   NMR  Gait with 1.5# wrist weights and focus on large arm swings 3 x 150 ft   Standing boxing with focus on reciprocal UE and LE movements. 4 x 10 ea UE, had boxing gloves and 1.5# wrist weights donned   Walk with same above step/ punch pattern and arm weights x 4 laps of 30 ft   Gait with 1.5# AW and focus on large UE arm swings  2 x 300 ft   TE:   Leg press 3 x 10 @ 70#   PATIENT EDUCATION: Education details: Pt educated throughout session about proper posture and technique with exercises. Improved exercise technique, movement at target joints, use of target  muscles after min to mod verbal, visual, tactile cues  Person educated: Patient and Spouse Education method: Explanation, Demonstration, and Verbal cues Education comprehension: verbalized understanding  HOME EXERCISE PROGRAM: Seated PWR! Moves x 10 ea, handout provided   GOALS: Goals reviewed with patient? Yes  SHORT TERM GOALS: Target date: 09/14/2023    Pt will be independent with HEP in order to demonstrate increased ability to perform tasks related to occupation/hobbies. Baseline:  Pt to be given HEP at next visit. Goal status: INITIAL  LONG TERM GOALS: Target date: 11/09/2023  1.  Patient (> 82 years old) will complete five times sit to stand test in < 15 seconds indicating an increased LE strength and improved balance. Baseline: 15.03  sec 4/14: 11.79 sec Goal status: INITIAL  2.  Patient will increase their ABC scale score to be 80.6% (an increase of 10 points), demonstrating improved balance confidence and reduced fall risk, as measured through weekly assessments. Baseline: 70.6% 4/14:72 % Goal status: INITIAL   3.  Patient will increase Berg Balance score by > 6 points to demonstrate decreased fall risk during functional activities. Baseline: 43 on 3/13 4/14:  Goal status: INITIAL   4.  Patient will reduce timed up and go to <11 seconds to reduce fall risk and demonstrate improved transfer/gait ability. Baseline: 13.38 sec 4/14: 11.4 sec no AD Goal status: INITIAL  5.  Patient will increase 10 meter walk test to >1.73m/s as to improve gait speed for better community ambulation and to reduce fall risk. Baseline: 12.15 sec; 0.82 m/s Goal status: INITIAL  6.  Patient will increase six minute walk test distance to >1000 for progression to community ambulator and improve gait ability Baseline: 459 ft with RW stopping at 5:30 due to fatigue  09/21/23:1128 ft with 4WW Goal status: INITIAL   ASSESSMENT:  CLINICAL IMPRESSION:  Patient arrived with good motivation for  completion of pt activities.  Pt progressing with dynamic gait and balance activities with focus on reciprocal arm swings. Pt gait without AD continues to improve but is recommended to use AD for improving low back pain with prolonged upright posture. Pt will continue to benefit from skilled physical therapy intervention to address impairments, improve QOL, and attain therapy goals.    OBJECTIVE IMPAIRMENTS: Abnormal gait, decreased activity tolerance, decreased balance, decreased endurance, decreased knowledge of use of DME, decreased mobility, difficulty walking, decreased ROM, decreased strength, and decreased safety awareness.   ACTIVITY LIMITATIONS: carrying, lifting, bending, standing, squatting, bathing, and locomotion level  PARTICIPATION LIMITATIONS: cleaning, laundry, driving, shopping, community activity, and yard work  PERSONAL FACTORS: Age, Education, Past/current experiences, Time since onset of injury/illness/exacerbation, and 3+ comorbidities: orthostatic HTN, syncope, falls, anemia, insomnia, dyspnea  are also affecting patient's functional outcome.   REHAB POTENTIAL: Fair pt has been seen in the past.  CLINICAL DECISION MAKING: Evolving/moderate complexity  EVALUATION COMPLEXITY: Moderate  PLAN:  PT FREQUENCY: 2x/week  PT DURATION: 12 weeks  PLANNED INTERVENTIONS: 97110-Therapeutic exercises, 97530- Therapeutic activity, 97112- Neuromuscular re-education, 97535- Self Care, 69629- Manual therapy, [phone]- Gait training, Balance training, Stair training, and Vestibular training  PLAN FOR NEXT SESSION:   Balance and strength interventions PD specific interventions as indicated   Edwina Gram PT ,DPT Physical Therapist- [facility]  [facility]  10/12/23, 11:50 AM

## 2023-10-13 ENCOUNTER — Other Ambulatory Visit: Payer: Self-pay | Admitting: Neurology

## 2023-10-13 DIAGNOSIS — G20A1 Parkinson's disease without dyskinesia, without mention of fluctuations: Secondary | ICD-10-CM

## 2023-10-15 ENCOUNTER — Ambulatory Visit: Payer: Medicare Other | Admitting: Physical Therapy

## 2023-10-15 DIAGNOSIS — R278 Other lack of coordination: Secondary | ICD-10-CM | POA: Diagnosis not present

## 2023-10-15 DIAGNOSIS — M6281 Muscle weakness (generalized): Secondary | ICD-10-CM

## 2023-10-15 DIAGNOSIS — R269 Unspecified abnormalities of gait and mobility: Secondary | ICD-10-CM | POA: Diagnosis not present

## 2023-10-15 DIAGNOSIS — R2681 Unsteadiness on feet: Secondary | ICD-10-CM | POA: Diagnosis not present

## 2023-10-15 DIAGNOSIS — R2689 Other abnormalities of gait and mobility: Secondary | ICD-10-CM | POA: Diagnosis not present

## 2023-10-15 DIAGNOSIS — R262 Difficulty in walking, not elsewhere classified: Secondary | ICD-10-CM

## 2023-10-15 NOTE — Therapy (Signed)
 OUTPATIENT PHYSICAL THERAPY NEURO TREATMENT    Patient Name: Johnathan Arnold. MRN: [id number] DOB:08-10-1945, 78 y.o., male Today's Date: 10/15/2023  PCP: Thersia Flax, MD  REFERRING PROVIDER: Thersia Flax, MD   END OF SESSION:  PT End of Session - 10/15/23 1105     Visit Number 16    Number of Visits 24    Date for PT Re-Evaluation 11/09/23    Authorization Type Medicare, BCBS supplement    Progress Note Due on Visit 20    PT Start Time 1103    PT Stop Time 1143    PT Time Calculation (min) 40 min    Equipment Utilized During Treatment Gait belt    Activity Tolerance Patient tolerated treatment well    Behavior During Therapy [facility] for tasks assessed/performed                      Past Medical History:  Diagnosis Date   3-vessel coronary artery disease    s/p  5 vessel CABG   Allergy June 2024   Diabetes mellitus without complication (HCC)    History of cardiac catheterization 2011   [facility]   Hyperlipidemia    Hypertension    Hypertriglyceridemia    Neuromuscular disorder [facility]) October 2017   Diagonis Parkinson   Parkinson's disease [facility])    Pneumonia 12/28/2020   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 LIMA to LAD, SVG to D1, OM1 and RPDA   CORONARY ARTERY BYPASS GRAFT  03/1998   5 vessel, [facility]   RIGHT HEART CATH N/A 04/04/2019   Procedure: RIGHT HEART CATH;  Surgeon: Wenona Hamilton, MD;  Location: [facility] INVASIVE CV LAB;  Service: Cardiovascular;  Laterality: N/A;   RIGHT/LEFT HEART CATH AND CORONARY ANGIOGRAPHY N/A 02/01/2018   Procedure: RIGHT/LEFT HEART CATH AND CORONARY ANGIOGRAPHY;  Surgeon: Wenona Hamilton, MD;  Location: [facility] INVASIVE CV LAB;  Service: Cardiovascular;  Laterality: N/A;   Patient Active Problem List   Diagnosis Date Noted   Diverticulitis 07/26/2023   RSV (respiratory syncytial virus pneumonia)  07/26/2023   Impaired ambulation 07/02/2023   Orthostatic hypotension 05/22/2023   Generalized weakness 05/07/2021   Bradycardia    Anemia, unspecified 02/07/2021   Neutropenia (HCC) 01/29/2021   Thrombocytopenia (HCC) 01/29/2021   Right lower lobe pneumonia 12/28/2020   Hyponatremia 12/22/2020   Bilateral leg weakness 12/20/2020   Prostate cancer screening 09/08/2020   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) 09/06/2020   Low back pain 08/19/2019   Pulmonary hypertension (HCC)    Insomnia 12/21/2018   Sleep apnea in adult 12/09/2018   Periodic limb movement disorder 12/09/2018   Pulmonary nodules 05/18/2018   Wears hearing aid in both ears 05/17/2018   Leg pain, bilateral 02/20/2018   Dyspnea    CKD stage 3a, GFR 45-59 ml/min (HCC) 09/21/2017   History of skin cancer in adulthood 08/14/2016   Parkinson's disease (HCC) 08/09/2016   Bilateral carotid artery stenosis 04/02/2015   Vertigo, peripheral 10/17/2014   Benign prostatic hyperplasia with urinary frequency 01/31/2014   Obesity 04/03/2013   Other malaise and fatigue 09/21/2012   Hyperlipidemia    Essential hypertension    3-vessel coronary artery disease    S/P CABG x 5     ONSET DATE: 07/03/23  REFERRING DIAG: M57.846 (ICD-10-CM) - Weakness of both lower  extremities   THERAPY DIAG:  Unsteadiness on feet  Difficulty in walking, not elsewhere classified  Other abnormalities of gait and mobility  Abnormality of gait and mobility  Muscle weakness (generalized)  Rationale for Evaluation and Treatment: Rehabilitation  SUBJECTIVE:                                                                                                                                                                                             SUBJECTIVE STATEMENT:  Pt reports doing well today. Pt denies any recent falls/stumbles since prior session. Pt denies any updates to medications or medical appointment since  prior session. Pt reports good compliance with HEP when time permits. Pt sleeping better and feeling well today.   Pt accompanied by: significant other  PERTINENT HISTORY:  From prior PT evaluation:  PD, history of COVID and significant weakness present following this diagnosis and the use of paxlovid ( August 5 dx date, thinks Paxlovid may have exacerbated PD symptoms)    Pt reports having COVID and having significant weakness.  Patient reports he also has back pain that is not present for a long time but was exacerbated more recently.  Patient previously did physical therapy for his back and experienced some relief but has not been consistent with the exercises.  Patient also has history of going to Parkinson's rock steady classes multiple times per week prior to onset of his COVID but he has not been back since. Patient previously ambulated without an assistive device but is now ambulating with a straight point cane.  Patient reports increased foot and ankle weakness in comparison with his hips and knees.  Patient also reports low back pain that is exacerbated with prolonged standing.  PAIN:  Are you having pain? No  PRECAUTIONS: Fall  RED FLAGS: None   WEIGHT BEARING RESTRICTIONS: No  FALLS: Has patient fallen in last 6 months? Yes. Number of falls 6  LIVING ENVIRONMENT: Lives with: lives with their spouse Lives in: House/apartment Stairs: Yes: Internal: 15 steps; on left going up and External: 1 steps; none Has following equipment at home: Quad cane large base and Walker - 2 wheeled  PLOF: Independent  PATIENT GOALS: to improve overall strength, get back to walking more regularly  OBJECTIVE:  Note: Objective measures were completed at Evaluation unless otherwise noted.  DIAGNOSTIC FINDINGS:   EXAM: CT HEAD WITHOUT CONTRAST  IMPRESSION: 1. No acute intracranial process. 2. Mild chronic small vessel ischemic changes. 3. Acute right frontal and maxillary  sinusitis.  COGNITION: Overall cognitive status: Within functional limits for tasks assessed   SENSATION: WFL  COORDINATION: [facility]  LOWER EXTREMITY ROM:     Active  Right Eval Left Eval  Hip flexion    Hip extension    Hip abduction    Hip adduction    Hip internal rotation    Hip external rotation    Knee flexion    Knee extension    Ankle dorsiflexion    Ankle plantarflexion    Ankle inversion    Ankle eversion     (Blank rows = not tested)  LOWER EXTREMITY MMT:    MMT Right Eval Left Eval  Hip flexion 4 4  Hip abduction 4 4  Hip adduction 4 4  Knee flexion 3+ 3+  Knee extension 4- 4-  Ankle dorsiflexion 4+ 4  (Blank rows = not tested)  BED MOBILITY:  No limitations according to the pt.  TRANSFERS: Assistive device utilized: None  Sit to stand: Complete Independence Stand to sit: Complete Independence Chair to chair: Complete Independence Floor: Not tested  FUNCTIONAL TESTS:  5 times sit to stand: 15.03 sec Timed up and go (TUG): 13.38 sec 6 minute walk test: TBD 10 meter walk test: 12.15 sec; 0.82 m/s Dynamic Gait Index: TBD  PATIENT SURVEYS:  ABC scale 70.6%                                                                                                                              TREATMENT DATE: 10/15/23  TA  Octane auto x  6 min ( intervals to challenge LE strength and endurance) level 1, 1.5 min, leg press mode x 4.5 min   Boxing strength, coordination and reaction training with " punch, punch, duck" with a squat to " dodge a punch" after 2 punches . X 10 squats/ 20 punches is one set, completed 2 sets   NMR  Gait with 1.5# wrist weights and focus on large arm swings 3 x 150 ft   Walk with reciprocal step and punch pattern and arm weights  ( 1.5#) x 4 laps of 30 ft    Gait with 1.5# AW and focus on large UE arm swings  2 x 550 ft   TE:   Leg press 3 x 12 @ 70#, cues for proper back positioning for optimal comfort     PATIENT EDUCATION: Education details: Pt educated throughout session about proper posture and technique with exercises. Improved exercise technique, movement at target joints, use of target muscles after min to mod verbal, visual, tactile cues  Person educated: Patient and Spouse Education method: Explanation, Demonstration, and Verbal cues Education comprehension: verbalized understanding  HOME EXERCISE PROGRAM: Seated PWR! Moves x 10 ea, handout provided   GOALS: Goals reviewed with patient? Yes  SHORT TERM GOALS: Target date: 09/14/2023    Pt will be independent with HEP in order to demonstrate increased ability to perform tasks related to occupation/hobbies. Baseline:  Pt to be given HEP at next visit. Goal status: INITIAL  LONG TERM GOALS: Target date: 11/09/2023  1.  Patient (> 29 years old) will complete five times sit to stand test in < 15 seconds indicating an increased LE strength and improved balance. Baseline: 15.03 sec 4/14: 11.79 sec Goal status: INITIAL  2.  Patient will increase their ABC scale score to be 80.6% (an increase of 10 points), demonstrating improved balance confidence and reduced fall risk, as measured through weekly assessments. Baseline: 70.6% 4/14:72 % Goal status: INITIAL   3.  Patient will increase Berg Balance score by > 6 points to demonstrate decreased fall risk during functional activities. Baseline: 43 on 3/13 4/14:  Goal status: INITIAL   4.  Patient will reduce timed up and go to <11 seconds to reduce fall risk and demonstrate improved transfer/gait ability. Baseline: 13.38 sec 4/14: 11.4 sec no AD Goal status: INITIAL  5.  Patient will increase 10 meter walk test to >1.74m/s as to improve gait speed for better community ambulation and to reduce fall risk. Baseline: 12.15 sec; 0.82 m/s Goal status: INITIAL  6.  Patient will increase six minute walk test distance to >1000 for progression to community ambulator and improve gait  ability Baseline: 459 ft with RW stopping at 5:30 due to fatigue  09/21/23:1128 ft with 4WW Goal status: INITIAL   ASSESSMENT:  CLINICAL IMPRESSION:  Patient arrived with good motivation for completion of pt activities.  Pt progresses with gait to a point where he is safe with community ambulation without AD. Pt encouraged to leave walker at home unless he is having a bad day or back pain. Pt progressing with dynamic gait and balance activities with focus on reciprocal arm swings. Pt gait without AD continues to improve but is recommended to use AD for improving low back pain with prolonged upright posture. Pt will continue to benefit from skilled physical therapy intervention to address impairments, improve QOL, and attain therapy goals.    OBJECTIVE IMPAIRMENTS: Abnormal gait, decreased activity tolerance, decreased balance, decreased endurance, decreased knowledge of use of DME, decreased mobility, difficulty walking, decreased ROM, decreased strength, and decreased safety awareness.   ACTIVITY LIMITATIONS: carrying, lifting, bending, standing, squatting, bathing, and locomotion level  PARTICIPATION LIMITATIONS: cleaning, laundry, driving, shopping, community activity, and yard work  PERSONAL FACTORS: Age, Education, Past/current experiences, Time since onset of injury/illness/exacerbation, and 3+ comorbidities: orthostatic HTN, syncope, falls, anemia, insomnia, dyspnea are also affecting patient's functional outcome.   REHAB POTENTIAL: Fair pt has been seen in the past.  CLINICAL DECISION MAKING: Evolving/moderate complexity  EVALUATION COMPLEXITY: Moderate  PLAN:  PT FREQUENCY: 2x/week  PT DURATION: 12 weeks  PLANNED INTERVENTIONS: 97110-Therapeutic exercises, 97530- Therapeutic activity, 97112- Neuromuscular re-education, 97535- Self Care, [id number]- Manual therapy, [phone]- Gait training, Balance training, Stair training, and Vestibular training  PLAN FOR NEXT SESSION:   Balance  and strength interventions PD specific interventions as indicated   Edwina Gram PT ,DPT Physical Therapist-   [facility]  10/15/23, 11:10 AM

## 2023-10-19 ENCOUNTER — Ambulatory Visit: Payer: Medicare Other | Admitting: Physical Therapy

## 2023-10-19 ENCOUNTER — Other Ambulatory Visit: Payer: Self-pay | Admitting: Neurology

## 2023-10-19 DIAGNOSIS — R278 Other lack of coordination: Secondary | ICD-10-CM | POA: Diagnosis not present

## 2023-10-19 DIAGNOSIS — R2689 Other abnormalities of gait and mobility: Secondary | ICD-10-CM

## 2023-10-19 DIAGNOSIS — M6281 Muscle weakness (generalized): Secondary | ICD-10-CM | POA: Diagnosis not present

## 2023-10-19 DIAGNOSIS — R262 Difficulty in walking, not elsewhere classified: Secondary | ICD-10-CM | POA: Diagnosis not present

## 2023-10-19 DIAGNOSIS — R269 Unspecified abnormalities of gait and mobility: Secondary | ICD-10-CM

## 2023-10-19 DIAGNOSIS — G20A1 Parkinson's disease without dyskinesia, without mention of fluctuations: Secondary | ICD-10-CM

## 2023-10-19 DIAGNOSIS — R2681 Unsteadiness on feet: Secondary | ICD-10-CM

## 2023-10-19 NOTE — Therapy (Signed)
 OUTPATIENT PHYSICAL THERAPY NEURO TREATMENT    Patient Name: Johnathan Arnold. MRN: [id number] DOB:1946/05/13, 78 y.o., male Today's Date: 10/19/2023  PCP: Thersia Flax, MD  REFERRING PROVIDER: Thersia Flax, MD   END OF SESSION:  PT End of Session - 10/19/23 1421     Visit Number 17    Number of Visits 24    Date for PT Re-Evaluation 11/09/23    Authorization Type Medicare, BCBS supplement    Progress Note Due on Visit 20    PT Start Time 1146    PT Stop Time 1226    PT Time Calculation (min) 40 min    Equipment Utilized During Treatment Gait belt    Activity Tolerance Patient tolerated treatment well    Behavior During Therapy [facility] for tasks assessed/performed                       Past Medical History:  Diagnosis Date   3-vessel coronary artery disease    s/p  5 vessel CABG   Allergy June 2024   Diabetes mellitus without complication (HCC)    History of cardiac catheterization 2011   [facility]   Hyperlipidemia    Hypertension    Hypertriglyceridemia    Neuromuscular disorder [facility]) October 2017   Diagonis Parkinson   Parkinson's disease [facility])    Pneumonia 12/28/2020   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 LIMA to LAD, SVG to D1, OM1 and RPDA   CORONARY ARTERY BYPASS GRAFT  03/1998   5 vessel, [facility]   RIGHT HEART CATH N/A 04/04/2019   Procedure: RIGHT HEART CATH;  Surgeon: Wenona Hamilton, MD;  Location: [facility] INVASIVE CV LAB;  Service: Cardiovascular;  Laterality: N/A;   RIGHT/LEFT HEART CATH AND CORONARY ANGIOGRAPHY N/A 02/01/2018   Procedure: RIGHT/LEFT HEART CATH AND CORONARY ANGIOGRAPHY;  Surgeon: Wenona Hamilton, MD;  Location: [facility] INVASIVE CV LAB;  Service: Cardiovascular;  Laterality: N/A;   Patient Active Problem List   Diagnosis Date Noted   Diverticulitis 07/26/2023   RSV (respiratory syncytial virus pneumonia)  07/26/2023   Impaired ambulation 07/02/2023   Orthostatic hypotension 05/22/2023   Generalized weakness 05/07/2021   Bradycardia    Anemia, unspecified 02/07/2021   Neutropenia (HCC) 01/29/2021   Thrombocytopenia (HCC) 01/29/2021   Right lower lobe pneumonia 12/28/2020   Hyponatremia 12/22/2020   Bilateral leg weakness 12/20/2020   Prostate cancer screening 09/08/2020   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) 09/06/2020   Low back pain 08/19/2019   Pulmonary hypertension (HCC)    Insomnia 12/21/2018   Sleep apnea in adult 12/09/2018   Periodic limb movement disorder 12/09/2018   Pulmonary nodules 05/18/2018   Wears hearing aid in both ears 05/17/2018   Leg pain, bilateral 02/20/2018   Dyspnea    CKD stage 3a, GFR 45-59 ml/min (HCC) 09/21/2017   History of skin cancer in adulthood 08/14/2016   Parkinson's disease (HCC) 08/09/2016   Bilateral carotid artery stenosis 04/02/2015   Vertigo, peripheral 10/17/2014   Benign prostatic hyperplasia with urinary frequency 01/31/2014   Obesity 04/03/2013   Other malaise and fatigue 09/21/2012   Hyperlipidemia    Essential hypertension    3-vessel coronary artery disease    S/P CABG x 5     ONSET DATE: 07/03/23  REFERRING DIAG: W29.562 (ICD-10-CM) - Weakness of both  lower extremities   THERAPY DIAG:  No diagnosis found.  Rationale for Evaluation and Treatment: Rehabilitation  SUBJECTIVE:                                                                                                                                                                                             SUBJECTIVE STATEMENT:  Pt reports doing well today. Pt denies any recent falls/stumbles since prior session. Pt denies any updates to medications or medical appointment since prior session. Pt reports good compliance with HEP when time permits. Pt a little sore after last visit in legs and back.   Pt accompanied by: significant  other  PERTINENT HISTORY:  From prior PT evaluation:  PD, history of COVID and significant weakness present following this diagnosis and the use of paxlovid ( August 5 dx date, thinks Paxlovid may have exacerbated PD symptoms)    Pt reports having COVID and having significant weakness.  Patient reports he also has back pain that is not present for a long time but was exacerbated more recently.  Patient previously did physical therapy for his back and experienced some relief but has not been consistent with the exercises.  Patient also has history of going to Parkinson's rock steady classes multiple times per week prior to onset of his COVID but he has not been back since. Patient previously ambulated without an assistive device but is now ambulating with a straight point cane.  Patient reports increased foot and ankle weakness in comparison with his hips and knees.  Patient also reports low back pain that is exacerbated with prolonged standing.  PAIN:  Are you having pain? No  PRECAUTIONS: Fall  RED FLAGS: None   WEIGHT BEARING RESTRICTIONS: No  FALLS: Has patient fallen in last 6 months? Yes. Number of falls 6  LIVING ENVIRONMENT: Lives with: lives with their spouse Lives in: House/apartment Stairs: Yes: Internal: 15 steps; on left going up and External: 1 steps; none Has following equipment at home: Quad cane large base and Walker - 2 wheeled  PLOF: Independent  PATIENT GOALS: to improve overall strength, get back to walking more regularly  OBJECTIVE:  Note: Objective measures were completed at Evaluation unless otherwise noted.  DIAGNOSTIC FINDINGS:   EXAM: CT HEAD WITHOUT CONTRAST  IMPRESSION: 1. No acute intracranial process. 2. Mild chronic small vessel ischemic changes. 3. Acute right frontal and maxillary sinusitis.  COGNITION: Overall cognitive status: Within functional limits for tasks assessed   SENSATION: WFL  COORDINATION: WFL    LOWER EXTREMITY  ROM:     Active  Right Eval Left Eval  Hip flexion  Hip extension    Hip abduction    Hip adduction    Hip internal rotation    Hip external rotation    Knee flexion    Knee extension    Ankle dorsiflexion    Ankle plantarflexion    Ankle inversion    Ankle eversion     (Blank rows = not tested)  LOWER EXTREMITY MMT:    MMT Right Eval Left Eval  Hip flexion 4 4  Hip abduction 4 4  Hip adduction 4 4  Knee flexion 3+ 3+  Knee extension 4- 4-  Ankle dorsiflexion 4+ 4  (Blank rows = not tested)  BED MOBILITY:  No limitations according to the pt.  TRANSFERS: Assistive device utilized: None  Sit to stand: Complete Independence Stand to sit: Complete Independence Chair to chair: Complete Independence Floor: Not tested  FUNCTIONAL TESTS:  5 times sit to stand: 15.03 sec Timed up and go (TUG): 13.38 sec 6 minute walk test: TBD 10 meter walk test: 12.15 sec; 0.82 m/s Dynamic Gait Index: TBD  PATIENT SURVEYS:  ABC scale 70.6%                                                                                                                              TREATMENT DATE: 10/19/23  TA  Octane auto x  6 min ( intervals to challenge LE strength and endurance) level 1, 1 min, leg press mode x 5 min   Boxing strength, coordination and reaction training with " punch, punch, duck" with a squat to " dodge a punch" after 2 punches . X 10 squats/ 20 punches is one set, completed 2 sets   NMR  Gait with 1.5# wrist weights and focus on large arm swings 3 x 150 ft   Walk with reciprocal step and punch pattern and arm weights  ( 1.5#) x 4 laps of 30 ft    Gait with 1.5# AW and focus on large UE arm swings  2 x 550 ft   TE:   Leg press 4 x 10 @ 55#, cues for proper back positioning for optimal comfort  -lightened weight due to excess soreness and some low back discomfort last session. Added set    PATIENT EDUCATION: Education details: Pt educated throughout session  about proper posture and technique with exercises. Improved exercise technique, movement at target joints, use of target muscles after min to mod verbal, visual, tactile cues  Person educated: Patient and Spouse Education method: Explanation, Demonstration, and Verbal cues Education comprehension: verbalized understanding  HOME EXERCISE PROGRAM: Seated PWR! Moves x 10 ea, handout provided   GOALS: Goals reviewed with patient? Yes  SHORT TERM GOALS: Target date: 09/14/2023    Pt will be independent with HEP in order to demonstrate increased ability to perform tasks related to occupation/hobbies. Baseline:  Pt to be given HEP at next visit. Goal status: INITIAL  LONG TERM GOALS: Target date: 11/09/2023  1.  Patient (> 62 years old) will complete five times sit to stand test in < 15 seconds indicating an increased LE strength and improved balance. Baseline: 15.03 sec 4/14: 11.79 sec Goal status: INITIAL  2.  Patient will increase their ABC scale score to be 80.6% (an increase of 10 points), demonstrating improved balance confidence and reduced fall risk, as measured through weekly assessments. Baseline: 70.6% 4/14:72 % Goal status: INITIAL   3.  Patient will increase Berg Balance score by > 6 points to demonstrate decreased fall risk during functional activities. Baseline: 43 on 3/13 4/14:  Goal status: INITIAL   4.  Patient will reduce timed up and go to <11 seconds to reduce fall risk and demonstrate improved transfer/gait ability. Baseline: 13.38 sec 4/14: 11.4 sec no AD Goal status: INITIAL  5.  Patient will increase 10 meter walk test to >1.58m/s as to improve gait speed for better community ambulation and to reduce fall risk. Baseline: 12.15 sec; 0.82 m/s Goal status: INITIAL  6.  Patient will increase six minute walk test distance to >1000 for progression to community ambulator and improve gait ability Baseline: 459 ft with RW stopping at 5:30 due to fatigue  09/21/23:1128 ft  with 4WW Goal status: INITIAL   ASSESSMENT:  CLINICAL IMPRESSION:  Patient arrived with good motivation for completion of pt activities. Pt progressing with dynamic gait and balance activities with focus on reciprocal arm swings, requires cues for L arm swing in particular. Pt gait without AD continues to improve but is recommended to use AD for improving low back pain with prolonged upright posture. Pt will continue to benefit from skilled physical therapy intervention to address impairments, improve QOL, and attain therapy goals.    OBJECTIVE IMPAIRMENTS: Abnormal gait, decreased activity tolerance, decreased balance, decreased endurance, decreased knowledge of use of DME, decreased mobility, difficulty walking, decreased ROM, decreased strength, and decreased safety awareness.   ACTIVITY LIMITATIONS: carrying, lifting, bending, standing, squatting, bathing, and locomotion level  PARTICIPATION LIMITATIONS: cleaning, laundry, driving, shopping, community activity, and yard work  PERSONAL FACTORS: Age, Education, Past/current experiences, Time since onset of injury/illness/exacerbation, and 3+ comorbidities: orthostatic HTN, syncope, falls, anemia, insomnia, dyspnea are also affecting patient's functional outcome.   REHAB POTENTIAL: Fair pt has been seen in the past.  CLINICAL DECISION MAKING: Evolving/moderate complexity  EVALUATION COMPLEXITY: Moderate  PLAN:  PT FREQUENCY: 2x/week  PT DURATION: 12 weeks  PLANNED INTERVENTIONS: 97110-Therapeutic exercises, 97530- Therapeutic activity, 97112- Neuromuscular re-education, 97535- Self Care, 96045- Manual therapy, [phone]- Gait training, Balance training, Stair training, and Vestibular training  PLAN FOR NEXT SESSION:   Balance and strength interventions PD specific interventions as indicated   Edwina Gram PT ,DPT Physical Therapist- [facility]  [facility]  10/19/23, 2:22 PM

## 2023-10-19 NOTE — Telephone Encounter (Signed)
 Left message on machine for patient to call back.

## 2023-10-20 ENCOUNTER — Encounter: Payer: Self-pay | Admitting: Neurology

## 2023-10-20 ENCOUNTER — Other Ambulatory Visit: Payer: Self-pay

## 2023-10-20 MED ORDER — CARBIDOPA-LEVODOPA ER 50-200 MG PO TBCR: 1.0000 | EXTENDED_RELEASE_TABLET | Freq: Every day | ORAL | 0 refills | Status: DC

## 2023-10-22 ENCOUNTER — Ambulatory Visit: Payer: Medicare Other | Admitting: Physical Therapy

## 2023-10-22 DIAGNOSIS — R262 Difficulty in walking, not elsewhere classified: Secondary | ICD-10-CM | POA: Diagnosis not present

## 2023-10-22 DIAGNOSIS — R269 Unspecified abnormalities of gait and mobility: Secondary | ICD-10-CM

## 2023-10-22 DIAGNOSIS — R2689 Other abnormalities of gait and mobility: Secondary | ICD-10-CM | POA: Diagnosis not present

## 2023-10-22 DIAGNOSIS — R2681 Unsteadiness on feet: Secondary | ICD-10-CM

## 2023-10-22 DIAGNOSIS — M6281 Muscle weakness (generalized): Secondary | ICD-10-CM | POA: Diagnosis not present

## 2023-10-22 DIAGNOSIS — R278 Other lack of coordination: Secondary | ICD-10-CM | POA: Diagnosis not present

## 2023-10-22 NOTE — Therapy (Signed)
 OUTPATIENT PHYSICAL THERAPY NEURO TREATMENT    Patient Name: Johnathan Arnold. MRN: [id number] DOB:[birth year], 78 y.o., male Today's Date: 10/22/2023  PCP: Thersia Flax, MD  REFERRING PROVIDER: Thersia Flax, MD   END OF SESSION:  PT End of Session - 10/22/23 1151     Visit Number 18    Number of Visits 24    Date for PT Re-Evaluation 11/09/23    Authorization Type Medicare, BCBS supplement    Progress Note Due on Visit 20    PT Start Time 1147    PT Stop Time 1227    PT Time Calculation (min) 40 min    Equipment Utilized During Treatment Gait belt    Activity Tolerance Patient tolerated treatment well    Behavior During Therapy [facility] for tasks assessed/performed                       Past Medical History:  Diagnosis Date   3-vessel coronary artery disease    s/p  5 vessel CABG   Allergy June 2024   Diabetes mellitus without complication (HCC)    History of cardiac catheterization 2011   [facility]   Hyperlipidemia    Hypertension    Hypertriglyceridemia    Neuromuscular disorder [facility]) October 2017   Diagonis Parkinson   Parkinson's disease [facility])    Pneumonia 12/28/2020   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 LIMA to LAD, SVG to D1, OM1 and RPDA   CORONARY ARTERY BYPASS GRAFT  03/1998   5 vessel, [facility]   RIGHT HEART CATH N/A 04/04/2019   Procedure: RIGHT HEART CATH;  Surgeon: Wenona Hamilton, MD;  Location: [facility] INVASIVE CV LAB;  Service: Cardiovascular;  Laterality: N/A;   RIGHT/LEFT HEART CATH AND CORONARY ANGIOGRAPHY N/A 02/01/2018   Procedure: RIGHT/LEFT HEART CATH AND CORONARY ANGIOGRAPHY;  Surgeon: Wenona Hamilton, MD;  Location: [facility] INVASIVE CV LAB;  Service: Cardiovascular;  Laterality: N/A;   Patient Active Problem List   Diagnosis Date Noted   Diverticulitis 07/26/2023   RSV (respiratory syncytial virus pneumonia)  07/26/2023   Impaired ambulation 07/02/2023   Orthostatic hypotension 05/22/2023   Generalized weakness 05/07/2021   Bradycardia    Anemia, unspecified 02/07/2021   Neutropenia (HCC) 01/29/2021   Thrombocytopenia (HCC) 01/29/2021   Right lower lobe pneumonia 12/28/2020   Hyponatremia 12/22/2020   Bilateral leg weakness 12/20/2020   Prostate cancer screening 09/08/2020   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) 09/06/2020   Low back pain 08/19/2019   Pulmonary hypertension (HCC)    Insomnia 12/21/2018   Sleep apnea in adult 12/09/2018   Periodic limb movement disorder 12/09/2018   Pulmonary nodules 05/18/2018   Wears hearing aid in both ears 05/17/2018   Leg pain, bilateral 02/20/2018   Dyspnea    CKD stage 3a, GFR 45-59 ml/min (HCC) 09/21/2017   History of skin cancer in adulthood 08/14/2016   Parkinson's disease (HCC) 08/09/2016   Bilateral carotid artery stenosis 04/02/2015   Vertigo, peripheral 10/17/2014   Benign prostatic hyperplasia with urinary frequency 01/31/2014   Obesity 04/03/2013   Other malaise and fatigue 09/21/2012   Hyperlipidemia    Essential hypertension    3-vessel coronary artery disease    S/P CABG x 5     ONSET DATE: 07/03/23  REFERRING DIAG: O96.295 (ICD-10-CM) - Weakness of both  lower extremities   THERAPY DIAG:  Unsteadiness on feet  Difficulty in walking, not elsewhere classified  Other abnormalities of gait and mobility  Abnormality of gait and mobility  Muscle weakness (generalized)  Rationale for Evaluation and Treatment: Rehabilitation  SUBJECTIVE:                                                                                                                                                                                             SUBJECTIVE STATEMENT:  Pt reports doing well today. Pt denies any recent falls/stumbles since prior session. Pt denies any updates to medications or medical appointment since  prior session. Pt reports good compliance with HEP when time permits.   Pt accompanied by: significant other  PERTINENT HISTORY:  From prior PT evaluation:  PD, history of COVID and significant weakness present following this diagnosis and the use of paxlovid ( August 5 dx date, thinks Paxlovid may have exacerbated PD symptoms)    Pt reports having COVID and having significant weakness.  Patient reports he also has back pain that is not present for a long time but was exacerbated more recently.  Patient previously did physical therapy for his back and experienced some relief but has not been consistent with the exercises.  Patient also has history of going to Parkinson's rock steady classes multiple times per week prior to onset of his COVID but he has not been back since. Patient previously ambulated without an assistive device but is now ambulating with a straight point cane.  Patient reports increased foot and ankle weakness in comparison with his hips and knees.  Patient also reports low back pain that is exacerbated with prolonged standing.  PAIN:  Are you having pain? No  PRECAUTIONS: Fall  RED FLAGS: None   WEIGHT BEARING RESTRICTIONS: No  FALLS: Has patient fallen in last 6 months? Yes. Number of falls 6  LIVING ENVIRONMENT: Lives with: lives with their spouse Lives in: House/apartment Stairs: Yes: Internal: 15 steps; on left going up and External: 1 steps; none Has following equipment at home: Quad cane large base and Walker - 2 wheeled  PLOF: Independent  PATIENT GOALS: to improve overall strength, get back to walking more regularly  OBJECTIVE:  Note: Objective measures were completed at Evaluation unless otherwise noted.  DIAGNOSTIC FINDINGS:   EXAM: CT HEAD WITHOUT CONTRAST  IMPRESSION: 1. No acute intracranial process. 2. Mild chronic small vessel ischemic changes. 3. Acute right frontal and maxillary sinusitis.  COGNITION: Overall cognitive status: Within  functional limits for tasks assessed   SENSATION: WFL  COORDINATION: WFL    LOWER EXTREMITY ROM:  Active  Right Eval Left Eval  Hip flexion    Hip extension    Hip abduction    Hip adduction    Hip internal rotation    Hip external rotation    Knee flexion    Knee extension    Ankle dorsiflexion    Ankle plantarflexion    Ankle inversion    Ankle eversion     (Blank rows = not tested)  LOWER EXTREMITY MMT:    MMT Right Eval Left Eval  Hip flexion 4 4  Hip abduction 4 4  Hip adduction 4 4  Knee flexion 3+ 3+  Knee extension 4- 4-  Ankle dorsiflexion 4+ 4  (Blank rows = not tested)  BED MOBILITY:  No limitations according to the pt.  TRANSFERS: Assistive device utilized: None  Sit to stand: Complete Independence Stand to sit: Complete Independence Chair to chair: Complete Independence Floor: Not tested  FUNCTIONAL TESTS:  5 times sit to stand: 15.03 sec Timed up and go (TUG): 13.38 sec 6 minute walk test: TBD 10 meter walk test: 12.15 sec; 0.82 m/s Dynamic Gait Index: TBD  PATIENT SURVEYS:  ABC scale 70.6%                                                                                                                              TREATMENT DATE: 10/22/23  TA  Octane auto x  6 min ( intervals to challenge LE strength and endurance) level 1, 1 min, leg press mode x 5 min   Boxing strength, coordination and reaction training with " punch, punch, duck" with a squat to " dodge a punch" after 2 punches . X 12 squats/ 24 punches is one set, completed 2 sets   NMR  Standing PWR! Moves with 1.5# wrist x 10 ea -up, rock, twist, step   Boxing with reciprocal UE and LE movements x 20 ft x 3 ( 2 sets)  On airex beam: PRW! twist, PWR! Step x 10 to ea  TE:   Leg press 3 x 10 @ 70#, cues for proper back positioning for optimal comfort     PATIENT EDUCATION: Education details: Pt educated throughout session about proper posture and technique with  exercises. Improved exercise technique, movement at target joints, use of target muscles after min to mod verbal, visual, tactile cues  Person educated: Patient and Spouse Education method: Explanation, Demonstration, and Verbal cues Education comprehension: verbalized understanding  HOME EXERCISE PROGRAM: Seated PWR! Moves x 10 ea, handout provided   GOALS: Goals reviewed with patient? Yes  SHORT TERM GOALS: Target date: 09/14/2023    Pt will be independent with HEP in order to demonstrate increased ability to perform tasks related to occupation/hobbies. Baseline:  Pt to be given HEP at next visit. Goal status: INITIAL  LONG TERM GOALS: Target date: 11/09/2023  1.  Patient (> 66 years old) will complete five times sit to stand test in < 15 seconds  indicating an increased LE strength and improved balance. Baseline: 15.03 sec 4/14: 11.79 sec Goal status: INITIAL  2.  Patient will increase their ABC scale score to be 80.6% (an increase of 10 points), demonstrating improved balance confidence and reduced fall risk, as measured through weekly assessments. Baseline: 70.6% 4/14:72 % Goal status: INITIAL   3.  Patient will increase Berg Balance score by > 6 points to demonstrate decreased fall risk during functional activities. Baseline: 43 on 3/13 4/14:  Goal status: INITIAL   4.  Patient will reduce timed up and go to <11 seconds to reduce fall risk and demonstrate improved transfer/gait ability. Baseline: 13.38 sec 4/14: 11.4 sec no AD Goal status: INITIAL  5.  Patient will increase 10 meter walk test to >1.54m/s as to improve gait speed for better community ambulation and to reduce fall risk. Baseline: 12.15 sec; 0.82 m/s Goal status: INITIAL  6.  Patient will increase six minute walk test distance to >1000 for progression to community ambulator and improve gait ability Baseline: 459 ft with RW stopping at 5:30 due to fatigue  09/21/23:1128 ft with 4WW Goal status:  INITIAL   ASSESSMENT:  CLINICAL IMPRESSION:  Patient arrived with good motivation for completion of pt activities. Pt progressing with dynamic gait and balance activities with focus on reciprocal arm swings, requires cues for L arm swing in particular. Pt gait without AD continues to improve but is recommended to use AD for improving low back pain with prolonged upright posture. Pt will continue to benefit from skilled physical therapy intervention to address impairments, improve QOL, and attain therapy goals.    OBJECTIVE IMPAIRMENTS: Abnormal gait, decreased activity tolerance, decreased balance, decreased endurance, decreased knowledge of use of DME, decreased mobility, difficulty walking, decreased ROM, decreased strength, and decreased safety awareness.   ACTIVITY LIMITATIONS: carrying, lifting, bending, standing, squatting, bathing, and locomotion level  PARTICIPATION LIMITATIONS: cleaning, laundry, driving, shopping, community activity, and yard work  PERSONAL FACTORS: Age, Education, Past/current experiences, Time since onset of injury/illness/exacerbation, and 3+ comorbidities: orthostatic HTN, syncope, falls, anemia, insomnia, dyspnea are also affecting patient's functional outcome.   REHAB POTENTIAL: Fair pt has been seen in the past.  CLINICAL DECISION MAKING: Evolving/moderate complexity  EVALUATION COMPLEXITY: Moderate  PLAN:  PT FREQUENCY: 2x/week  PT DURATION: 12 weeks  PLANNED INTERVENTIONS: 97110-Therapeutic exercises, 97530- Therapeutic activity, 97112- Neuromuscular re-education, 97535- Self Care, 16109- Manual therapy, [phone]- Gait training, Balance training, Stair training, and Vestibular training  PLAN FOR NEXT SESSION:   Balance and strength interventions PD specific interventions as indicated   Edwina Gram PT ,DPT Physical Therapist- [facility]  [facility]  10/22/23, 11:52 AM

## 2023-10-22 NOTE — Telephone Encounter (Signed)
 Called pt he is still taking this medication.

## 2023-10-22 NOTE — Telephone Encounter (Signed)
 Why did we refuse this?

## 2023-10-26 ENCOUNTER — Ambulatory Visit: Payer: Medicare Other | Admitting: Physical Therapy

## 2023-10-27 ENCOUNTER — Ambulatory Visit: Payer: Medicare Other | Admitting: Physical Therapy

## 2023-10-27 DIAGNOSIS — R278 Other lack of coordination: Secondary | ICD-10-CM | POA: Diagnosis not present

## 2023-10-27 DIAGNOSIS — R2689 Other abnormalities of gait and mobility: Secondary | ICD-10-CM

## 2023-10-27 DIAGNOSIS — R262 Difficulty in walking, not elsewhere classified: Secondary | ICD-10-CM | POA: Diagnosis not present

## 2023-10-27 DIAGNOSIS — R269 Unspecified abnormalities of gait and mobility: Secondary | ICD-10-CM

## 2023-10-27 DIAGNOSIS — M6281 Muscle weakness (generalized): Secondary | ICD-10-CM

## 2023-10-27 DIAGNOSIS — R2681 Unsteadiness on feet: Secondary | ICD-10-CM | POA: Diagnosis not present

## 2023-10-27 NOTE — Therapy (Signed)
 OUTPATIENT PHYSICAL THERAPY NEURO TREATMENT    Patient Name: Johnathan Arnold. MRN: [id number] DOB:09/09/1945, 78 y.o., male Today's Date: 10/27/2023  PCP: Thersia Flax, MD  REFERRING PROVIDER: Thersia Flax, MD   END OF SESSION:  PT End of Session - 10/27/23 1456     Visit Number 19    Number of Visits 24    Date for PT Re-Evaluation 11/09/23    Authorization Type Medicare, BCBS supplement    Progress Note Due on Visit 20    PT Start Time 1447    PT Stop Time 1528    PT Time Calculation (min) 41 min    Equipment Utilized During Treatment Gait belt    Activity Tolerance Patient tolerated treatment well    Behavior During Therapy [facility] for tasks assessed/performed                        Past Medical History:  Diagnosis Date   3-vessel coronary artery disease    s/p  5 vessel CABG   Allergy June 2024   Diabetes mellitus without complication (HCC)    History of cardiac catheterization 2011   [facility]   Hyperlipidemia    Hypertension    Hypertriglyceridemia    Neuromuscular disorder [facility]) October 2017   Diagonis Parkinson   Parkinson's disease [facility])    Pneumonia 12/28/2020   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 LIMA to LAD, SVG to D1, OM1 and RPDA   CORONARY ARTERY BYPASS GRAFT  03/1998   5 vessel, [facility]   RIGHT HEART CATH N/A 04/04/2019   Procedure: RIGHT HEART CATH;  Surgeon: Wenona Hamilton, MD;  Location: [facility] INVASIVE CV LAB;  Service: Cardiovascular;  Laterality: N/A;   RIGHT/LEFT HEART CATH AND CORONARY ANGIOGRAPHY N/A 02/01/2018   Procedure: RIGHT/LEFT HEART CATH AND CORONARY ANGIOGRAPHY;  Surgeon: Wenona Hamilton, MD;  Location: [facility] INVASIVE CV LAB;  Service: Cardiovascular;  Laterality: N/A;   Patient Active Problem List   Diagnosis Date Noted   Diverticulitis 07/26/2023   RSV (respiratory syncytial virus pneumonia)  07/26/2023   Impaired ambulation 07/02/2023   Orthostatic hypotension 05/22/2023   Generalized weakness 05/07/2021   Bradycardia    Anemia, unspecified 02/07/2021   Neutropenia (HCC) 01/29/2021   Thrombocytopenia (HCC) 01/29/2021   Right lower lobe pneumonia 12/28/2020   Hyponatremia 12/22/2020   Bilateral leg weakness 12/20/2020   Prostate cancer screening 09/08/2020   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) 09/06/2020   Low back pain 08/19/2019   Pulmonary hypertension (HCC)    Insomnia 12/21/2018   Sleep apnea in adult 12/09/2018   Periodic limb movement disorder 12/09/2018   Pulmonary nodules 05/18/2018   Wears hearing aid in both ears 05/17/2018   Leg pain, bilateral 02/20/2018   Dyspnea    CKD stage 3a, GFR 45-59 ml/min (HCC) 09/21/2017   History of skin cancer in adulthood 08/14/2016   Parkinson's disease (HCC) 08/09/2016   Bilateral carotid artery stenosis 04/02/2015   Vertigo, peripheral 10/17/2014   Benign prostatic hyperplasia with urinary frequency 01/31/2014   Obesity 04/03/2013   Other malaise and fatigue 09/21/2012   Hyperlipidemia    Essential hypertension    3-vessel coronary artery disease    S/P CABG x 5     ONSET DATE: 07/03/23  REFERRING DIAG: G95.621 (ICD-10-CM) - Weakness of  both lower extremities   THERAPY DIAG:  Unsteadiness on feet  Difficulty in walking, not elsewhere classified  Other abnormalities of gait and mobility  Abnormality of gait and mobility  Muscle weakness (generalized)  Rationale for Evaluation and Treatment: Rehabilitation  SUBJECTIVE:                                                                                                                                                                                             SUBJECTIVE STATEMENT:  Pt reports feeling tired today but no falls or LOB since last date. Went to rock steady yesterday.   Pt accompanied by: significant other  PERTINENT  HISTORY:  From prior PT evaluation:  PD, history of COVID and significant weakness present following this diagnosis and the use of paxlovid ( August 5 dx date, thinks Paxlovid may have exacerbated PD symptoms)    Pt reports having COVID and having significant weakness.  Patient reports he also has back pain that is not present for a long time but was exacerbated more recently.  Patient previously did physical therapy for his back and experienced some relief but has not been consistent with the exercises.  Patient also has history of going to Parkinson's rock steady classes multiple times per week prior to onset of his COVID but he has not been back since. Patient previously ambulated without an assistive device but is now ambulating with a straight point cane.  Patient reports increased foot and ankle weakness in comparison with his hips and knees.  Patient also reports low back pain that is exacerbated with prolonged standing.  PAIN:  Are you having pain? No  PRECAUTIONS: Fall  RED FLAGS: None   WEIGHT BEARING RESTRICTIONS: No  FALLS: Has patient fallen in last 6 months? Yes. Number of falls 6  LIVING ENVIRONMENT: Lives with: lives with their spouse Lives in: House/apartment Stairs: Yes: Internal: 15 steps; on left going up and External: 1 steps; none Has following equipment at home: Quad cane large base and Walker - 2 wheeled  PLOF: Independent  PATIENT GOALS: to improve overall strength, get back to walking more regularly  OBJECTIVE:  Note: Objective measures were completed at Evaluation unless otherwise noted.  DIAGNOSTIC FINDINGS:   EXAM: CT HEAD WITHOUT CONTRAST  IMPRESSION: 1. No acute intracranial process. 2. Mild chronic small vessel ischemic changes. 3. Acute right frontal and maxillary sinusitis.  COGNITION: Overall cognitive status: Within functional limits for tasks assessed   SENSATION: WFL  COORDINATION: WFL    LOWER EXTREMITY ROM:     Active   Right Eval Left Eval  Hip flexion  Hip extension    Hip abduction    Hip adduction    Hip internal rotation    Hip external rotation    Knee flexion    Knee extension    Ankle dorsiflexion    Ankle plantarflexion    Ankle inversion    Ankle eversion     (Blank rows = not tested)  LOWER EXTREMITY MMT:    MMT Right Eval Left Eval  Hip flexion 4 4  Hip abduction 4 4  Hip adduction 4 4  Knee flexion 3+ 3+  Knee extension 4- 4-  Ankle dorsiflexion 4+ 4  (Blank rows = not tested)  BED MOBILITY:  No limitations according to the pt.  TRANSFERS: Assistive device utilized: None  Sit to stand: Complete Independence Stand to sit: Complete Independence Chair to chair: Complete Independence Floor: Not tested  FUNCTIONAL TESTS:  5 times sit to stand: 15.03 sec Timed up and go (TUG): 13.38 sec 6 minute walk test: TBD 10 meter walk test: 12.15 sec; 0.82 m/s Dynamic Gait Index: TBD  PATIENT SURVEYS:  ABC scale 70.6%                                                                                                                              TREATMENT DATE: 10/27/23  TA  Octane auto x  6 min ( intervals to challenge LE strength and endurance) level 1, 1 min, leg press mode x 5 min   Boxing strength, coordination and reaction training with " punch, punch, duck" with a squat to " dodge a punch" after 2 punches . X 12 squats/ 24 punches is one set, completed 2 sets   Seated HABD for postural activation and awareness 2 x 10 with GTB   NMR  STS PWR! Up flow to PWR! Step x 10 with 3# AW and 1.5# wrist weights    Standing PWR! Rock 2 x 10 ea sid ewith 1.5# wrist [employer] with reciprocal UE and LE movements x 20 ft x 3 ( 2 sets)with 1.5# wrist weights   Pt required occasional rest breaks due fatigue, PT was attentive to when pt appeared to be tired or winded in order to prevent excessive fatigue.   PATIENT EDUCATION: Education details: Pt educated throughout  session about proper posture and technique with exercises. Improved exercise technique, movement at target joints, use of target muscles after min to mod verbal, visual, tactile cues  Person educated: Patient and Spouse Education method: Explanation, Demonstration, and Verbal cues Education comprehension: verbalized understanding  HOME EXERCISE PROGRAM: Seated PWR! Moves x 10 ea, handout provided   GOALS: Goals reviewed with patient? Yes  SHORT TERM GOALS: Target date: 09/14/2023    Pt will be independent with HEP in order to demonstrate increased ability to perform tasks related to occupation/hobbies. Baseline:  Pt to be given HEP at next visit. Goal status: INITIAL  LONG TERM GOALS: Target date: 11/09/2023  1.  Patient (> 85 years old) will complete five times sit to stand test in < 15 seconds indicating an increased LE strength and improved balance. Baseline: 15.03 sec 4/14: 11.79 sec Goal status: INITIAL  2.  Patient will increase their ABC scale score to be 80.6% (an increase of 10 points), demonstrating improved balance confidence and reduced fall risk, as measured through weekly assessments. Baseline: 70.6% 4/14:72 % Goal status: INITIAL   3.  Patient will increase Berg Balance score by > 6 points to demonstrate decreased fall risk during functional activities. Baseline: 43 on 3/13 4/14:  Goal status: INITIAL   4.  Patient will reduce timed up and go to <11 seconds to reduce fall risk and demonstrate improved transfer/gait ability. Baseline: 13.38 sec 4/14: 11.4 sec no AD Goal status: INITIAL  5.  Patient will increase 10 meter walk test to >1.53m/s as to improve gait speed for better community ambulation and to reduce fall risk. Baseline: 12.15 sec; 0.82 m/s Goal status: INITIAL  6.  Patient will increase six minute walk test distance to >1000 for progression to community ambulator and improve gait ability Baseline: 459 ft with RW stopping at 5:30 due to fatigue   09/21/23:1128 ft with 4WW Goal status: INITIAL   ASSESSMENT:  CLINICAL IMPRESSION:  Patient arrived with good motivation for completion of pt activities.  Decreased volume little bit today due to patient feeling very fatigued at beginning of session.  With increase frequency of rest break patient still tolerates all exercises well.  Pt progressing with dynamic gait and balance activities with focus on reciprocal arm swings, requires cues for L arm swing in particular. Pt gait without AD continues to improve but is recommended to use AD for improving low back pain with prolonged upright posture. Pt will continue to benefit from skilled physical therapy intervention to address impairments, improve QOL, and attain therapy goals.    OBJECTIVE IMPAIRMENTS: Abnormal gait, decreased activity tolerance, decreased balance, decreased endurance, decreased knowledge of use of DME, decreased mobility, difficulty walking, decreased ROM, decreased strength, and decreased safety awareness.   ACTIVITY LIMITATIONS: carrying, lifting, bending, standing, squatting, bathing, and locomotion level  PARTICIPATION LIMITATIONS: cleaning, laundry, driving, shopping, community activity, and yard work  PERSONAL FACTORS: Age, Education, Past/current experiences, Time since onset of injury/illness/exacerbation, and 3+ comorbidities: orthostatic HTN, syncope, falls, anemia, insomnia, dyspnea are also affecting patient's functional outcome.   REHAB POTENTIAL: Fair pt has been seen in the past.  CLINICAL DECISION MAKING: Evolving/moderate complexity  EVALUATION COMPLEXITY: Moderate  PLAN:  PT FREQUENCY: 2x/week  PT DURATION: 12 weeks  PLANNED INTERVENTIONS: 97110-Therapeutic exercises, 97530- Therapeutic activity, 97112- Neuromuscular re-education, 97535- Self Care, 69629- Manual therapy, [phone]- Gait training, Balance training, Stair training, and Vestibular training  PLAN FOR NEXT SESSION:   Balance and strength  interventions PD specific interventions as indicated   Edwina Gram PT ,DPT Physical Therapist- [facility]  10/27/23, 4:05 PM

## 2023-10-29 ENCOUNTER — Ambulatory Visit: Payer: Medicare Other | Admitting: Physical Therapy

## 2023-11-05 ENCOUNTER — Ambulatory Visit

## 2023-11-05 ENCOUNTER — Ambulatory Visit: Payer: Medicare Other | Admitting: Physical Therapy

## 2023-11-05 DIAGNOSIS — R269 Unspecified abnormalities of gait and mobility: Secondary | ICD-10-CM

## 2023-11-05 DIAGNOSIS — R2681 Unsteadiness on feet: Secondary | ICD-10-CM | POA: Diagnosis not present

## 2023-11-05 DIAGNOSIS — R278 Other lack of coordination: Secondary | ICD-10-CM

## 2023-11-05 DIAGNOSIS — M6281 Muscle weakness (generalized): Secondary | ICD-10-CM | POA: Diagnosis not present

## 2023-11-05 DIAGNOSIS — R2689 Other abnormalities of gait and mobility: Secondary | ICD-10-CM

## 2023-11-05 DIAGNOSIS — R262 Difficulty in walking, not elsewhere classified: Secondary | ICD-10-CM

## 2023-11-05 NOTE — Therapy (Addendum)
 OUTPATIENT OCCUPATIONAL THERAPY NEURO EVALUATION  Patient Name: Johnathan Arnold. MRN: [id number] DOB:1945-08-13, 78 y.o., male Today's Date: 11/06/2023  PCP: Dr. Creta Dolin REFERRING PROVIDER: Dr. Creta Dolin  END OF SESSION:  OT End of Session - 11/06/23 1954     Visit Number 1    Number of Visits 24    Date for OT Re-Evaluation 01/28/24    OT Start Time 1145    OT Stop Time 1230    OT Time Calculation (min) 45 min    Activity Tolerance Patient tolerated treatment well    Behavior During Therapy [facility] for tasks assessed/performed            Past Medical History:  Diagnosis Date   3-vessel coronary artery disease    s/p  5 vessel CABG   Allergy June 2024   Diabetes mellitus without complication (HCC)    History of cardiac catheterization 2011   [facility]   Hyperlipidemia    Hypertension    Hypertriglyceridemia    Neuromuscular disorder 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 LIMA to LAD, SVG to D1, OM1 and RPDA   CORONARY ARTERY BYPASS GRAFT  03/1998   5 vessel, [facility]   RIGHT HEART CATH N/A 04/04/2019   Procedure: RIGHT HEART CATH;  Surgeon: Wenona Hamilton, MD;  Location: [facility] INVASIVE CV LAB;  Service: Cardiovascular;  Laterality: N/A;   RIGHT/LEFT HEART CATH AND CORONARY ANGIOGRAPHY N/A 02/01/2018   Procedure: RIGHT/LEFT HEART CATH AND CORONARY ANGIOGRAPHY;  Surgeon: Wenona Hamilton, MD;  Location: [facility] INVASIVE CV LAB;  Service: Cardiovascular;  Laterality: N/A;   Patient Active Problem List   Diagnosis Date Noted   Diverticulitis 07/26/2023   RSV (respiratory syncytial virus pneumonia) 07/26/2023   Impaired ambulation 07/02/2023   Orthostatic hypotension 05/22/2023   Generalized weakness 05/07/2021   Bradycardia    Anemia, unspecified  02/07/2021   Neutropenia (HCC) 01/29/2021   Thrombocytopenia (HCC) 01/29/2021   Right lower lobe pneumonia 12/28/2020   Hyponatremia 12/22/2020   Bilateral leg weakness 12/20/2020   Prostate cancer screening 09/08/2020   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) 09/06/2020   Low back pain 08/19/2019   Pulmonary hypertension (HCC)    Insomnia 12/21/2018   Sleep apnea in adult 12/09/2018   Periodic limb movement disorder 12/09/2018   Pulmonary nodules 05/18/2018   Wears hearing aid in both ears 05/17/2018   Leg pain, bilateral 02/20/2018   Dyspnea    CKD stage 3a, GFR 45-59 ml/min (HCC) 09/21/2017   History of skin cancer in adulthood 08/14/2016   Parkinson's disease (HCC) 08/09/2016   Bilateral carotid artery stenosis 04/02/2015   Vertigo, peripheral 10/17/2014   Benign prostatic hyperplasia with urinary frequency 01/31/2014   Obesity 04/03/2013   Other malaise and fatigue 09/21/2012   Hyperlipidemia    Essential hypertension    3-vessel coronary artery disease    S/P CABG x 5    ONSET DATE: 07/03/23 (hospitalization); diagnosed with PD 2017  REFERRING DIAG: Parkinson's Disease  THERAPY DIAG:  Muscle weakness (generalized)  Other lack of coordination  Rationale for Evaluation and Treatment: Rehabilitation  SUBJECTIVE:  SUBJECTIVE STATEMENT: "After I was in the hospital with pneumonia in Jan-feb off/on, I noticed my handwriting declined."  Pt accompanied  by: significant other, spouse Johnathan Arnold  PERTINENT HISTORY: Per chart on 08/07/23: Johnathan Arnold. is a 78 y.o. male with medical history significant of CAD status post CABG, type 2 diabetes, Parkinson disease, hypertension presenting with weakness.     Patient noted to have been admitted 07/26/23, 07/02/23 for weakness and were treated for issues including right lower lobe pneumonia, RSV, diverticulitis.  Per the son, patient has had progressively worsening weakness and fatigue at home.  Has grown more  lethargic over the past 1 to 2 days PTA with patient not getting out of bed.   Pt also reports xx of L3 fx and pelvic fx.  PRECAUTIONS: Fall  WEIGHT BEARING RESTRICTIONS: No  PAIN: No pain at present  Are you having pain? Yes: NPRS scale: 0/10, can reach 6-7/10 pain Pain location: low back Pain description: achy Aggravating factors: prolonged standing Relieving factors: tylenol /rest   FALLS: Has patient fallen in last 6 months? Yes. Number of falls "several falls"   LIVING ENVIRONMENT: Lives with: lives with their spouse Lives in: House Stairs: 15 steps, 1 rail on the left going up, 1 external step without rails Has following equipment at home: shower chair, RW, comfort height toilet, large base quad cane   PLOF: Independent, handwriting legible for check writing and addressing envelopes prior to this Winter  PATIENT GOALS: "I hope to improve my writing or printing."   OBJECTIVE:  Note: Objective measures were completed at Evaluation unless otherwise noted.  HAND DOMINANCE: Right  ADLs: Overall ADLs: Pt reports decline in ADLs since this Winter after multiple hospitalizations Transfers/ambulation related to ADLs: intermittent use of a RW if back pain is present, mobility is slower without walker  Eating: tremors present when manipulation eating utensils (worse on the R but still does eat with the R); messy, food drops (ok with stabbing food but not balancing food on fork).  Spills frequently when drinking from a glass Grooming: able to shave with a blade but reports this to be slightly challenging  UB Dressing: difficulty with buttoning shirts; spouse buttons "most" of his shirts  LB Dressing: challenging to button and zip up slacks, able to tie shoes without difficulty Toileting: indep  Bathing: distant supv for a shower  Tub Shower transfers: distant supv using walk in shower with shower chair Equipment: see above  IADLs:             Shopping: Spouse manages as pt gets  tired walking  Light housekeeping: spouse manages most of it; pt reports he can mop the kitchen floor  Meal Prep: pt can manage light meal prep; must use both hands to pour a drink pitcher and carries items in the L hand Community mobility: present today without AD; Pt reports that spouse mostly drives, but he is able Medication management: Pt reports being indep with pill set up with weekly pill organizer for pt and spouse's meds; very rarely reports dropping pills; pt reports he can be forgetful to take pills on occasion, and takes PD meds every 3 hours. Financial management: Pt manages but reports recent decline with legibility when writing checks.  Pt reports he writes 15-20 checks a month  and wrote a check 1-2 weeks ago that was illegible.  Pt states that he had to type out the address and return address on the envelope. Handwriting: Printed and signed name with good legibility today, but with mild micrographia  MOBILITY STATUS: See PT eval  ACTIVITY TOLERANCE:  Activity tolerance: WFL for tasks assessed;  to be further assessed in functional contexts Pt reports he has participated in [employer] 3 days a week since 2017  FUNCTIONAL OUTCOME MEASURES: TBD  UPPER EXTREMITY ROM:  WFL   UPPER EXTREMITY MMT:     MMT Right eval Left eval  Shoulder flexion 4+ 4+  Shoulder abduction 4+ 4+  Shoulder adduction    Shoulder extension    Shoulder internal rotation    Shoulder external rotation    Middle trapezius    Lower trapezius    Elbow flexion 5 5  Elbow extension 5 5  Wrist flexion 5 5  Wrist extension 5 5  Wrist ulnar deviation    Wrist radial deviation    Wrist pronation    Wrist supination    (Blank rows = not tested)  HAND FUNCTION: Grip strength: Right: 65 lbs; Left: 65 lbs, Lateral pinch: Right: 14 lbs, Left: 13 lbs, and 3 point pinch: Right: 12 lbs, Left: 10 lbs  COORDINATION: 9 Hole Peg test: Right: 31 sec; Left: 30 sec  SENSATION: BUEs WFL  EDEMA: No  visible edema  MUSCLE TONE: No noted rigidity or increased tone in BUEs  COGNITION: Overall cognitive status: WFL for tasks assessed, but pt does endorse some decreased memory  VISION: Cataracts removed both eyes a couple years ago; wears reading glasses   OBSERVATIONS:  Mild resting and action tremors in BUEs, R>L                                                                                              TREATMENT DATE: 11/05/23  Evaluation completed.  Self Care: Initial education provided on potential AE options for improving performance with self feeding and handwriting.  PATIENT EDUCATION: Education details: OT role, goals, poc, AE Person educated: pt and spouse Education method: explanation/vc Education comprehension: verbalized understanding  HOME EXERCISE PROGRAM: Not yet initiated  GOALS: Goals reviewed with patient?   SHORT TERM GOALS: Target date: 12/17/23  Pt will be indep with HEP with use of visual handout for improving/maintaining bilat hand strength/coordination for daily tasks. Baseline: Eval: HEP not yet initiated. Goal status: INITIAL  2.  Pt will verbalize and implement 1-2 compensatory strategies to improve handwriting legibility for writing checks with min vc or less. Baseline: Eval: Further training needed; mild micrographia and legibility is inconsistent  Goal status: INITIAL  LONG TERM GOALS: Target date: 01/28/24  Pt will write 3 consecutive checks for bill paying with 100% legibility.  Baseline: Eval: Pt reports that he wrote a check 1-2 weeks ago that was illegible Goal status: INITIAL  2.  Pt will be indep to manage shirt and pants buttons and zippers with modified indep using AE as needed at least 75% of the time. Baseline: Eval: Pt reports difficulty with shirt and pants buttons and zippers and reports spouse buttons most of his shirts. Goal status: INITIAL  3.  Pt will report consistent medication adherence at least 5 of 7 days a week  using a phone alarm or other external reminder. Baseline: Eval: Pt endorses memory deficits and needing to set up his phone alarm for med management,  but states he got frustrated with an attempt at setting this up on his phone. Goal status: INITIAL   4.  Pt will verbalize and implement 1-2 compensatory strategies with min vc or less to reduce spilling and dropped food during meal times.    Baseline: Eval: Pt endorses messy eating d/t tremors/food dropping from utensils and drink spills   Goal status: INITIAL  ASSESSMENT:  CLINICAL IMPRESSION: Patient is a 78 y.o. male who was seen today for occupational therapy evaluation for functional decline related to Parkinson's Disease.  Pt had multiple hospitalizations for RSV/pna with subsequent decline in overall strength this Winter as a result.  Pt reports his [facility] skills declined after this, and his main goal is to improve his handwriting for writing checks.  Pt reports good handwriting legibility before recent hospitalizations, and writing about 15-20 checks per week to pay bills.  Pt reports his handwriting was illegibility to fill out a check 1-2 weeks ago which resulted in him having to print out the forwarding and return addresses on the mailing envelope.  Pt presents with good strength, but limited GMC/FMC skills, noting resting and action tremors in BUEs, with the R dominant side being more effected than the L.  Pt endorses difficulty with eating utensils and spilling his food/drink as a result of his tremors, and difficulty managing clothing fasteners.  Pt will benefit from skilled OT to address the above noted ADL impairments, working to increase indep with daily tasks and reduce burden of care on spouse.    PERFORMANCE DEFICITS: in functional skills including ADLs, IADLs, coordination, dexterity, strength, pain, Fine motor control, Gross motor control, mobility, endurance, decreased knowledge of use of DME, and UE functional use, cognitive skills  including emotional, memory, problem solving, and thought, and psychosocial skills including coping strategies, environmental adaptation, habits, and routines and behaviors.   IMPAIRMENTS: are limiting patient from ADLs, IADLs, and leisure.   CO-MORBIDITIES: has co-morbidities such as   that affects occupational performance. Patient will benefit from skilled OT to address above impairments and improve overall function.  MODIFICATION OR ASSISTANCE TO COMPLETE EVALUATION: Min-Moderate modification of tasks or assist with assess necessary to complete an evaluation.  OT OCCUPATIONAL PROFILE AND HISTORY: Detailed assessment: Review of records and additional review of physical, cognitive, psychosocial history related to current functional performance.  CLINICAL DECISION MAKING: Moderate - several treatment options, min-mod task modification necessary  REHAB POTENTIAL: Good  EVALUATION COMPLEXITY: Moderate    PLAN:  OT FREQUENCY: 2x/week  OT DURATION: 12 weeks  PLANNED INTERVENTIONS: 97168 OT Re-evaluation, 97535 self care/ADL training, 91478 therapeutic exercise, 97530 therapeutic activity, 97112 neuromuscular re-education, 97140 manual therapy, 97010 moist heat, psychosocial skills training, energy conservation, coping strategies training, patient/family education, and DME and/or AE instructions  RECOMMENDED OTHER SERVICES: None at this time (pt currently working with PT)  CONSULTED AND AGREED WITH PLAN OF CARE: Patient and family member/caregiver  PLAN FOR NEXT SESSION: See above  Marcus Sewer, MS, OTR/L   Casandra Claw, OT 11/06/2023, 7:57 PM

## 2023-11-05 NOTE — Therapy (Signed)
 OUTPATIENT PHYSICAL THERAPY NEURO TREATMENT/Physical Therapy Progress Note   Dates of reporting period  09/21/23   to   11/05/23     Patient Name: Johnathan Arnold. MRN: [id number] DOB:03/24/1946, 78 y.o., male Today's Date: 11/05/2023  PCP: Thersia Flax, MD  REFERRING PROVIDER: Thersia Flax, MD   END OF SESSION:  PT End of Session - 11/05/23 1105     Visit Number 20    Number of Visits 24    Date for PT Re-Evaluation 11/09/23    Authorization Type Medicare, BCBS supplement    Progress Note Due on Visit 20    PT Start Time 1100    PT Stop Time 1140    PT Time Calculation (min) 40 min    Equipment Utilized During Treatment Gait belt    Activity Tolerance Patient tolerated treatment well    Behavior During Therapy [facility] for tasks assessed/performed                        Past Medical History:  Diagnosis Date   3-vessel coronary artery disease    s/p  5 vessel CABG   Allergy June 2024   Diabetes mellitus without complication (HCC)    History of cardiac catheterization 2011   [facility]   Hyperlipidemia    Hypertension    Hypertriglyceridemia    Neuromuscular disorder [facility]) October 2017   Diagonis Parkinson   Parkinson's disease [facility])    Pneumonia 12/28/2020   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 LIMA to LAD, SVG to D1, OM1 and RPDA   CORONARY ARTERY BYPASS GRAFT  03/1998   5 vessel, [facility]   RIGHT HEART CATH N/A 04/04/2019   Procedure: RIGHT HEART CATH;  Surgeon: Wenona Hamilton, MD;  Location: [facility] INVASIVE CV LAB;  Service: Cardiovascular;  Laterality: N/A;   RIGHT/LEFT HEART CATH AND CORONARY ANGIOGRAPHY N/A 02/01/2018   Procedure: RIGHT/LEFT HEART CATH AND CORONARY ANGIOGRAPHY;  Surgeon: Wenona Hamilton, MD;  Location: [facility] INVASIVE CV LAB;  Service: Cardiovascular;  Laterality: N/A;   Patient Active Problem List    Diagnosis Date Noted   Diverticulitis 07/26/2023   RSV (respiratory syncytial virus pneumonia) 07/26/2023   Impaired ambulation 07/02/2023   Orthostatic hypotension 05/22/2023   Generalized weakness 05/07/2021   Bradycardia    Anemia, unspecified 02/07/2021   Neutropenia (HCC) 01/29/2021   Thrombocytopenia (HCC) 01/29/2021   Right lower lobe pneumonia 12/28/2020   Hyponatremia 12/22/2020   Bilateral leg weakness 12/20/2020   Prostate cancer screening 09/08/2020   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) 09/06/2020   Low back pain 08/19/2019   Pulmonary hypertension (HCC)    Insomnia 12/21/2018   Sleep apnea in adult 12/09/2018   Periodic limb movement disorder 12/09/2018   Pulmonary nodules 05/18/2018   Wears hearing aid in both ears 05/17/2018   Leg pain, bilateral 02/20/2018   Dyspnea    CKD stage 3a, GFR 45-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/min (HCC) 09/21/2017   History of skin cancer in adulthood 08/14/2016   Parkinson's disease (HCC) 08/09/2016   Bilateral carotid artery stenosis 04/02/2015   Vertigo, peripheral 10/17/2014   Benign prostatic hyperplasia with urinary frequency 01/31/2014   Obesity 04/03/2013   Other malaise and fatigue 09/21/2012   Hyperlipidemia    Essential hypertension    3-vessel coronary artery disease    S/P  CABG x 5     ONSET DATE: 07/03/23  REFERRING DIAG: W09.811 (ICD-10-CM) - Weakness of both lower extremities   THERAPY DIAG:  Unsteadiness on feet  Difficulty in walking, not elsewhere classified  Other abnormalities of gait and mobility  Abnormality of gait and mobility  Rationale for Evaluation and Treatment: Rehabilitation  SUBJECTIVE:                                                                                                                                                                                             SUBJECTIVE STATEMENT:  Pt reports feeling tired today but no falls or LOB since last date. Went to rock steady  yesterday. Had a good trip to [facility] to visit his child and grandchildren.   Pt accompanied by: significant other  PERTINENT HISTORY:  From prior PT evaluation:  PD, history of COVID and significant weakness present following this diagnosis and the use of paxlovid ( August 5 dx date, thinks Paxlovid may have exacerbated PD symptoms)    Pt reports having COVID and having significant weakness.  Patient reports he also has back pain that is not present for a long time but was exacerbated more recently.  Patient previously did physical therapy for his back and experienced some relief but has not been consistent with the exercises.  Patient also has history of going to Parkinson's rock steady classes multiple times per week prior to onset of his COVID but he has not been back since. Patient previously ambulated without an assistive device but is now ambulating with a straight point cane.  Patient reports increased foot and ankle weakness in comparison with his hips and knees.  Patient also reports low back pain that is exacerbated with prolonged standing.  PAIN:  Are you having pain? No  PRECAUTIONS: Fall  RED FLAGS: None   WEIGHT BEARING RESTRICTIONS: No  FALLS: Has patient fallen in last 6 months? Yes. Number of falls 6  LIVING ENVIRONMENT: Lives with: lives with their spouse Lives in: House/apartment Stairs: Yes: Internal: 15 steps; on left going up and External: 1 steps; none Has following equipment at home: Quad cane large base and Walker - 2 wheeled  PLOF: Independent  PATIENT GOALS: to improve overall strength, get back to walking more regularly  OBJECTIVE:  Note: Objective measures were completed at Evaluation unless otherwise noted.  DIAGNOSTIC FINDINGS:   EXAM: CT HEAD WITHOUT CONTRAST  IMPRESSION: 1. No acute intracranial process. 2. Mild chronic small vessel ischemic changes. 3. Acute right frontal and maxillary sinusitis.  COGNITION: Overall cognitive status: Within  functional limits for tasks assessed   SENSATION:  WFL  COORDINATION: WFL    LOWER EXTREMITY ROM:     Active  Right Eval Left Eval  Hip flexion    Hip extension    Hip abduction    Hip adduction    Hip internal rotation    Hip external rotation    Knee flexion    Knee extension    Ankle dorsiflexion    Ankle plantarflexion    Ankle inversion    Ankle eversion     (Blank rows = not tested)  LOWER EXTREMITY MMT:    MMT Right Eval Left Eval  Hip flexion 4 4  Hip abduction 4 4  Hip adduction 4 4  Knee flexion 3+ 3+  Knee extension 4- 4-  Ankle dorsiflexion 4+ 4  (Blank rows = not tested)  BED MOBILITY:  No limitations according to the pt.  TRANSFERS: Assistive device utilized: None  Sit to stand: Complete Independence Stand to sit: Complete Independence Chair to chair: Complete Independence Floor: Not tested  FUNCTIONAL TESTS:  5 times sit to stand: 15.03 sec Timed up and go (TUG): 13.38 sec 6 minute walk test: TBD 10 meter walk test: 12.15 sec; 0.82 m/s Dynamic Gait Index: TBD  PATIENT SURVEYS:  ABC scale 70.6%                                                                                                                              TREATMENT DATE: 11/05/23  Physical therapy treatment session today consisted of completing assessment of goals and administration of testing as demonstrated and documented in flow sheet, treatment, and goals section of this note. Addition treatments may be found below.   Physical Performance   Patient demonstrates increased fall risk as noted by score of  56 /56 on Berg Balance Scale.  (<36= high risk for falls, close to 100%; 37-45 significant >80%; 46-51 moderate >50%; 52-55 lower >25%)  [facility] PT Assessment - 11/05/23 0001       Berg Balance Test   Sit to Stand Able to stand without using hands and stabilize independently    Standing Unsupported Able to stand safely 2 minutes    Sitting with Back Unsupported but  Feet Supported on Floor or Stool Able to sit safely and securely 2 minutes    Stand to Sit Sits safely with minimal use of hands    Transfers Able to transfer safely, minor use of hands    Standing Unsupported with Eyes Closed Able to stand 10 seconds safely    Standing Unsupported with Feet Together Able to place feet together independently and stand 1 minute safely    From Standing, Reach Forward with Outstretched Arm Can reach confidently >25 cm (10")    From Standing Position, Pick up Object from Floor Able to pick up shoe safely and easily    From Standing Position, Turn to Look Behind Over each Shoulder Looks behind from both sides and weight shifts well  Turn 360 Degrees Able to turn 360 degrees safely in 4 seconds or less    Standing Unsupported, Alternately Place Feet on Step/Stool Able to stand independently and safely and complete 8 steps in 20 seconds    Standing Unsupported, One Foot in Front Able to place foot tandem independently and hold 30 seconds    Standing on One Leg Able to lift leg independently and hold > 10 seconds    Total Score 56            10 Meter Walk Test: Patient instructed to walk 10 meters (32.8 ft) as quickly and as safely as possible at their normal speed Results: 1.06 m/s   Cut off scores:   Household Ambulator  < 0.4 m/s  Limited Community Ambulator  0.4 - 0.8 m/s  [employer]  > 0.8 m/s  Increased fall risk  < 1.51m/s  Crossing a Street  >1.45m/s  MCID 0.05 m/s (small), 0.13 m/s (moderate), 0.06 m/s (significant)  (ANPTA Core Set of Outcome Measures for Adults with Neurologic Conditions, 2018)   PT instructed pt in TUG: 10.74 sec ( >13.5 sec indicates increased fall risk)  Five times Sit to Stand Test (FTSS)  TIME: 11.06 sec  Cut off scores indicative of increased fall risk: >12 sec CVA, >16 sec PD, >13 sec vestibular (ANPTA Core Set of Outcome Measures for Adults with Neurologic Conditions, 2018)  TE  STS PWR! Up x 10    Leg press x 15 @ 55# -4 x 10 @ 70 #    PATIENT EDUCATION: Education details: Pt educated throughout session about proper posture and technique with exercises. Improved exercise technique, movement at target joints, use of target muscles after min to mod verbal, visual, tactile cues  Person educated: Patient and Spouse Education method: Explanation, Demonstration, and Verbal cues Education comprehension: verbalized understanding  HOME EXERCISE PROGRAM: Seated PWR! Moves x 10 ea, handout provided   GOALS: Goals reviewed with patient? Yes  SHORT TERM GOALS: Target date: 09/14/2023    Pt will be independent with HEP in order to demonstrate increased ability to perform tasks related to occupation/hobbies. Baseline:  Pt to be given HEP at next visit. Goal status: INITIAL  LONG TERM GOALS: Target date: 11/09/2023  1.  Patient (> 38 years old) will complete five times sit to stand test in < 15 seconds indicating an increased LE strength and improved balance. Baseline: 15.03 sec 4/14: 11.79 sec  5/29: 11.06 sec Goal status: MET  2.  Patient will increase their ABC scale score to be 80.6% (an increase of 10 points), demonstrating improved balance confidence and reduced fall risk, as measured through weekly assessments. Baseline: 70.6% 4/14:72 % 5/29: 74% Goal status: INITIAL   3.  Patient will increase Berg Balance score by > 6 points to demonstrate decreased fall risk during functional activities. Baseline: 43 on 3/13 4/14: 49 5/29: 56 Goal status: MET   4.  Patient will reduce timed up and go to <11 seconds to reduce fall risk and demonstrate improved transfer/gait ability. Baseline: 13.38 sec 4/14: 11.4 sec no AD 5/29: 10.78 sec Goal status: MET  5.  Patient will increase 10 meter walk test to >1.35m/s as to improve gait speed for better community ambulation and to reduce fall risk. Baseline: 12.15 sec; 0.82 m/s 5/29: 1.06 m/s Goal status: MET  6.  Patient will increase six  minute walk test distance to >1000 for progression to community ambulator and improve gait ability Baseline: 459 ft  with RW stopping at 5:30 due to fatigue  09/21/23:1128 ft with 4WW  Goal status: MET   ASSESSMENT:  CLINICAL IMPRESSION:  Patient presents for progress note this date. Pt has met or nearly met all origninal long term goals. Pt still working toward his ABC scale goal indicating continued impaired confidence with activities as it relates to his balance. Will perform high level balance test of mini Best at pt next therapy session to further assess his balance deficits and address any remaining deficits. Pt BERG, , 5XSTS and TUG all indicate lessened risk of falls compared ot initial evaluation. Patient's condition has the potential to improve in response to therapy. Maximum improvement is yet to be obtained. The anticipated improvement is attainable and reasonable in a generally predictable time.  Pt will continue to benefit from skilled physical therapy intervention to address impairments, improve QOL, and attain therapy goals.     OBJECTIVE IMPAIRMENTS: Abnormal gait, decreased activity tolerance, decreased balance, decreased endurance, decreased knowledge of use of DME, decreased mobility, difficulty walking, decreased ROM, decreased strength, and decreased safety awareness.   ACTIVITY LIMITATIONS: carrying, lifting, bending, standing, squatting, bathing, and locomotion level  PARTICIPATION LIMITATIONS: cleaning, laundry, driving, shopping, community activity, and yard work  PERSONAL FACTORS: Age, Education, Past/current experiences, Time since onset of injury/illness/exacerbation, and 3+ comorbidities: orthostatic HTN, syncope, falls, anemia, insomnia, dyspnea are also affecting patient's functional outcome.   REHAB POTENTIAL: Fair pt has been seen in the past.  CLINICAL DECISION MAKING: Evolving/moderate complexity  EVALUATION COMPLEXITY: Moderate  PLAN:  PT  FREQUENCY: 2x/week  PT DURATION: 12 weeks  PLANNED INTERVENTIONS: 97110-Therapeutic exercises, 97530- Therapeutic activity, 97112- Neuromuscular re-education, 97535- Self Care, 96045- Manual therapy, [phone]- Gait training, Balance training, Stair training, and Vestibular training  PLAN FOR NEXT SESSION:  Mini Best for recert, add goal for independent with community based gym program Begin well zone training as appropriate   Balance and strength interventions PD specific interventions as indicated   Edwina Gram PT ,DPT Physical Therapist- [facility]  [facility]  11/05/23, 3:11 PM

## 2023-11-08 NOTE — Addendum Note (Signed)
 Addended by: Morayo Leven K on: 11/08/2023 10:07 PM   Modules accepted: Orders

## 2023-11-09 ENCOUNTER — Ambulatory Visit: Payer: Medicare Other | Attending: Internal Medicine | Admitting: Physical Therapy

## 2023-11-09 ENCOUNTER — Ambulatory Visit

## 2023-11-09 DIAGNOSIS — R269 Unspecified abnormalities of gait and mobility: Secondary | ICD-10-CM

## 2023-11-09 DIAGNOSIS — M5459 Other low back pain: Secondary | ICD-10-CM | POA: Diagnosis not present

## 2023-11-09 DIAGNOSIS — R2689 Other abnormalities of gait and mobility: Secondary | ICD-10-CM | POA: Diagnosis not present

## 2023-11-09 DIAGNOSIS — M6281 Muscle weakness (generalized): Secondary | ICD-10-CM

## 2023-11-09 DIAGNOSIS — R262 Difficulty in walking, not elsewhere classified: Secondary | ICD-10-CM

## 2023-11-09 DIAGNOSIS — R29898 Other symptoms and signs involving the musculoskeletal system: Secondary | ICD-10-CM

## 2023-11-09 DIAGNOSIS — R278 Other lack of coordination: Secondary | ICD-10-CM

## 2023-11-09 DIAGNOSIS — R2681 Unsteadiness on feet: Secondary | ICD-10-CM

## 2023-11-09 NOTE — Therapy (Unsigned)
 OUTPATIENT OCCUPATIONAL THERAPY NEURO TREATMENT NOTE  Patient Name: Johnathan Arnold. MRN: [id number] DOB:12-Dec-1945, 78 y.o., male Today's Date: 11/11/2023  PCP: Dr. Creta Dolin REFERRING PROVIDER: Dr. Creta Dolin  END OF SESSION:  OT End of Session - 11/11/23 1059     Visit Number 2    Number of Visits 24    Date for OT Re-Evaluation 01/28/24    OT Start Time 1100    OT Stop Time 1145    OT Time Calculation (min) 45 min    Activity Tolerance Patient tolerated treatment well    Behavior During Therapy [facility] for tasks assessed/performed            Past Medical History:  Diagnosis Date   3-vessel coronary artery disease    s/p  5 vessel CABG   Allergy June 2024   Diabetes mellitus without complication (HCC)    History of cardiac catheterization 2011   [facility]   Hyperlipidemia    Hypertension    Hypertriglyceridemia    Neuromuscular disorder [facility]) October 2017   Diagonis Parkinson   Parkinson's disease [facility])    Pneumonia 12/28/2020   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 LIMA to LAD, SVG to D1, OM1 and RPDA   CORONARY ARTERY BYPASS GRAFT  03/1998   5 vessel, [facility]   RIGHT HEART CATH N/A 04/04/2019   Procedure: RIGHT HEART CATH;  Surgeon: Wenona Hamilton, MD;  Location: [facility] INVASIVE CV LAB;  Service: Cardiovascular;  Laterality: N/A;   RIGHT/LEFT HEART CATH AND CORONARY ANGIOGRAPHY N/A 02/01/2018   Procedure: RIGHT/LEFT HEART CATH AND CORONARY ANGIOGRAPHY;  Surgeon: Wenona Hamilton, MD;  Location: [facility] INVASIVE CV LAB;  Service: Cardiovascular;  Laterality: N/A;   Patient Active Problem List   Diagnosis Date Noted   Diverticulitis 07/26/2023   RSV (respiratory syncytial virus pneumonia) 07/26/2023   Impaired ambulation 07/02/2023   Orthostatic hypotension 05/22/2023   Generalized weakness 05/07/2021   Bradycardia    Anemia, unspecified  02/07/2021   Neutropenia (HCC) 01/29/2021   Thrombocytopenia (HCC) 01/29/2021   Right lower lobe pneumonia 12/28/2020   Hyponatremia 12/22/2020   Bilateral leg weakness 12/20/2020   Prostate cancer screening 09/08/2020   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) 09/06/2020   Low back pain 08/19/2019   Pulmonary hypertension (HCC)    Insomnia 12/21/2018   Sleep apnea in adult 12/09/2018   Periodic limb movement disorder 12/09/2018   Pulmonary nodules 05/18/2018   Wears hearing aid in both ears 05/17/2018   Leg pain, bilateral 02/20/2018   Dyspnea    CKD stage 3a, GFR 45-59 ml/min (HCC) 09/21/2017   History of skin cancer in adulthood 08/14/2016   Parkinson's disease (HCC) 08/09/2016   Bilateral carotid artery stenosis 04/02/2015   Vertigo, peripheral 10/17/2014   Benign prostatic hyperplasia with urinary frequency 01/31/2014   Obesity 04/03/2013   Other malaise and fatigue 09/21/2012   Hyperlipidemia    Essential hypertension    3-vessel coronary artery disease    S/P CABG x 5    ONSET DATE: 07/03/23 (hospitalization); diagnosed with PD 2017  REFERRING DIAG: Parkinson's Disease  THERAPY DIAG:  Muscle weakness (generalized)  Other lack of coordination  Rationale for Evaluation and Treatment: Rehabilitation  SUBJECTIVE:  SUBJECTIVE STATEMENT: Pt reports doing well today. Pt accompanied by: self  PERTINENT HISTORY: Per chart on 08/07/23: Johnathan Arnold  Johnathan Arnold. is a 78 y.o. male with medical history significant of CAD status post CABG, type 2 diabetes, Parkinson disease, hypertension presenting with weakness.     Patient noted to have been admitted 07/26/23, 07/02/23 for weakness and were treated for issues including right lower lobe pneumonia, RSV, diverticulitis.  Per the son, patient has had progressively worsening weakness and fatigue at home.  Has grown more lethargic over the past 1 to 2 days PTA with patient not getting out of bed.   Pt also reports  xx of L3 fx and pelvic fx.  PRECAUTIONS: Fall  WEIGHT BEARING RESTRICTIONS: No  PAIN: No pain at present  Are you having pain? Yes: NPRS scale: 0/10, can reach 6-7/10 pain Pain location: low back Pain description: achy Aggravating factors: prolonged standing Relieving factors: tylenol /rest   FALLS: Has patient fallen in last 6 months? Yes. Number of falls "several falls"   LIVING ENVIRONMENT: Lives with: lives with their spouse Lives in: House Stairs: 15 steps, 1 rail on the left going up, 1 external step without rails Has following equipment at home: shower chair, RW, comfort height toilet, large base quad cane   PLOF: Independent, handwriting legible for check writing and addressing envelopes prior to this Winter  PATIENT GOALS: "I hope to improve my writing or printing."   OBJECTIVE:  Note: Objective measures were completed at Evaluation unless otherwise noted.  HAND DOMINANCE: Right  ADLs: Overall ADLs: Pt reports decline in ADLs since this Winter after multiple hospitalizations Transfers/ambulation related to ADLs: intermittent use of a RW if back pain is present, mobility is slower without walker  Eating: tremors present when manipulation eating utensils (worse on the R but still does eat with the R); messy, food drops (ok with stabbing food but not balancing food on fork).  Spills frequently when drinking from a glass Grooming: able to shave with a blade but reports this to be slightly challenging  UB Dressing: difficulty with buttoning shirts; spouse buttons "most" of his shirts  LB Dressing: challenging to button and zip up slacks, able to tie shoes without difficulty Toileting: indep  Bathing: distant supv for a shower  Tub Shower transfers: distant supv using walk in shower with shower chair Equipment: see above  IADLs:             Shopping: Spouse manages as pt gets tired walking  Light housekeeping: spouse manages most of it; pt reports he can mop the kitchen  floor  Meal Prep: pt can manage light meal prep; must use both hands to pour a drink pitcher and carries items in the L hand Community mobility: present today without AD; Pt reports that spouse mostly drives, but he is able Medication management: Pt reports being indep with pill set up with weekly pill organizer for pt and spouse's meds; very rarely reports dropping pills; pt reports he can be forgetful to take pills on occasion, and takes PD meds every 3 hours. Financial management: Pt manages but reports recent decline with legibility when writing checks.  Pt reports he writes 15-20 checks a month  and wrote a check 1-2 weeks ago that was illegible.  Pt states that he had to type out the address and return address on the envelope. Handwriting: Printed and signed name with good legibility today, but with mild micrographia  MOBILITY STATUS: See PT eval  ACTIVITY TOLERANCE:  Activity tolerance: WFL for tasks assessed; to be further assessed in functional contexts Pt reports he has participated in [employer] 3 days a week since 2017  FUNCTIONAL OUTCOME MEASURES: TBD  UPPER EXTREMITY ROM:  WFL   UPPER EXTREMITY MMT:     MMT Right eval Left eval  Shoulder flexion 4+ 4+  Shoulder abduction 4+ 4+  Shoulder adduction    Shoulder extension    Shoulder internal rotation    Shoulder external rotation    Middle trapezius    Lower trapezius    Elbow flexion 5 5  Elbow extension 5 5  Wrist flexion 5 5  Wrist extension 5 5  Wrist ulnar deviation    Wrist radial deviation    Wrist pronation    Wrist supination    (Blank rows = not tested)  HAND FUNCTION: Grip strength: Right: 65 lbs; Left: 65 lbs, Lateral pinch: Right: 14 lbs, Left: 13 lbs, and 3 point pinch: Right: 12 lbs, Left: 10 lbs  COORDINATION: 9 Hole Peg test: Right: 31 sec; Left: 30 sec  SENSATION: BUEs WFL  EDEMA: No visible edema  MUSCLE TONE: No noted rigidity or increased tone in BUEs  COGNITION: Overall  cognitive status: WFL for tasks assessed, but pt does endorse some decreased memory  VISION: Cataracts removed both eyes a couple years ago; wears reading glasses   OBSERVATIONS:  Mild resting and action tremors in BUEs, R>L                                                                                              TREATMENT DATE: 11/09/23  Therapeutic Exercise: -Issued green theraputty and instructed in gross grasping, digit opposition, lateral pinching, and 3 point pinching in R/L hands.  Provided handout with review for carry over at home; good return demo.  OT reinforced benefits of theraputty for maintaining focus on hand strength given his progressive disease of PD.  Self Care: -Trial of various pen/pen grip types for improving prehension of pen in hand for writing checks: Bip grip, standard pen with and without rubber grip with finger indentations, PenAgain, built up foam grip around standard pen -Trial of Dycem and note pad beneath paper for increasing proprioceptive feedback during writing -Trial of 1 and 2 pen weights to increase pen stability  Micrographia: -Instructed in completion of finger flicks with encouragement to perform pre-writing and during rest breaks to promote improved motor planning when writing feels less fluid and small. -Practiced trials of exaggerated letter height (using 2 lines instead of 1 on lined paper pad) with mod verbal and visual cues for reaching top and bottom line with each pen stroke.  PATIENT EDUCATION: Education details: AE/compensatory strategies for handwriting; theraputty Person educated: pt  Education method: explanation/vc/demo Education comprehension: verbalized understanding, demonstrated understanding, further training needed  HOME EXERCISE PROGRAM: -green theraputty -handwriting  GOALS: Goals reviewed with patient?   SHORT TERM GOALS: Target date: 12/17/23  Pt will be indep with HEP with use of visual handout for  improving/maintaining bilat hand strength/coordination for daily tasks. Baseline: Eval: HEP not yet initiated. Goal status: INITIAL  2.  Pt will verbalize and implement 1-2 compensatory strategies to improve handwriting legibility for writing checks with min vc or less.  Baseline: Eval: Further training needed; mild micrographia and legibility is inconsistent  Goal status: INITIAL  LONG TERM GOALS: Target date: 01/28/24  Pt will write 3 consecutive checks for bill paying with 100% legibility.  Baseline: Eval: Pt reports that he wrote a check 1-2 weeks ago that was illegible Goal status: INITIAL  2.  Pt will be indep to manage shirt and pants buttons and zippers with modified indep using AE as needed at least 75% of the time. Baseline: Eval: Pt reports difficulty with shirt and pants buttons and zippers and reports spouse buttons most of his shirts. Goal status: INITIAL  3.  Pt will report consistent medication adherence at least 5 of 7 days a week using a phone alarm or other external reminder. Baseline: Eval: Pt endorses memory deficits and needing to set up his phone alarm for med management, but states he got frustrated with an attempt at setting this up on his phone. Goal status: INITIAL   4.  Pt will verbalize and implement 1-2 compensatory strategies with min vc or less to reduce spilling and dropped food during meal times.    Baseline: Eval: Pt endorses messy eating d/t tremors/food dropping from utensils and drink spills   Goal status: INITIAL  ASSESSMENT:  CLINICAL IMPRESSION: Pt with good return demo of theraputty for bilat hand strength and shows good understanding of written handout for carryover at home with min vc provided.  Will review in upcoming sessions to ensure carryover/consistent understanding of exercises.  With handwriting trials, pt found standard pen with rubber pen grip to be most effective for maintaining good prehension on peg.  Pt found no benefit to pen  weights and disliked other pen options noted above.  Pt practiced high reps of writing first/last name and address, noting increased micrographia after several minutes and repetitions.  Pt appeared to benefit from reps of finger flicks/rest breaks, and practice with exaggerated letter height using 2 lines instead of 1 with mod verbal and visual cues for reaching top and bottom line with each pen stroke, all practiced to minimize micrographia.  Pt will continue to benefit from skilled OT for continued focus on handwriting skills, instruction in ADL compensation/AE, and HEP review in order to maximize indep with ADL/IADL tasks while adapting to physical/cognitive/functional changes with a neurodegenerative disease.   PERFORMANCE DEFICITS: in functional skills including ADLs, IADLs, coordination, dexterity, strength, pain, Fine motor control, Gross motor control, mobility, endurance, decreased knowledge of use of DME, and UE functional use, cognitive skills including emotional, memory, problem solving, and thought, and psychosocial skills including coping strategies, environmental adaptation, habits, and routines and behaviors.   IMPAIRMENTS: are limiting patient from ADLs, IADLs, and leisure.   CO-MORBIDITIES: has co-morbidities such as   that affects occupational performance. Patient will benefit from skilled OT to address above impairments and improve overall function.  MODIFICATION OR ASSISTANCE TO COMPLETE EVALUATION: Min-Moderate modification of tasks or assist with assess necessary to complete an evaluation.  OT OCCUPATIONAL PROFILE AND HISTORY: Detailed assessment: Review of records and additional review of physical, cognitive, psychosocial history related to current functional performance.  CLINICAL DECISION MAKING: Moderate - several treatment options, min-mod task modification necessary  REHAB POTENTIAL: Good  EVALUATION COMPLEXITY: Moderate    PLAN:  OT FREQUENCY: 2x/week  OT  DURATION: 12 weeks  PLANNED INTERVENTIONS: 97168 OT Re-evaluation, 97535 self care/ADL training, 40981 therapeutic exercise, 97530 therapeutic activity, 97112 neuromuscular re-education, 97140 manual therapy, 97010 moist heat, psychosocial skills training, energy conservation, coping strategies training,  patient/family education, and DME and/or AE instructions  RECOMMENDED OTHER SERVICES: None at this time (pt currently working with PT)  CONSULTED AND AGREED WITH PLAN OF CARE: Patient and family member/caregiver  PLAN FOR NEXT SESSION: See above  Marcus Sewer, MS, OTR/L  Casandra Claw, OT 11/11/2023, 11:01 AM

## 2023-11-09 NOTE — Therapy (Unsigned)
 OUTPATIENT PHYSICAL THERAPY NEURO TREATMENT/   Patient Name: Johnathan Arnold. MRN: [id number] DOB:[birth year], 78 y.o., male Today's Date: 11/09/2023  PCP: Thersia Flax, MD  REFERRING PROVIDER: Thersia Flax, MD   END OF SESSION:  PT End of Session - 11/09/23 1148     Visit Number 21    Number of Visits 24    Date for PT Re-Evaluation 11/09/23    Authorization Type Medicare, BCBS supplement    Progress Note Due on Visit 20    PT Start Time 1146    PT Stop Time 1226    PT Time Calculation (min) 40 min    Equipment Utilized During Treatment Gait belt    Activity Tolerance Patient tolerated treatment well    Behavior During Therapy [facility] for tasks assessed/performed                        Past Medical History:  Diagnosis Date   3-vessel coronary artery disease    s/p  5 vessel CABG   Allergy June 2024   Diabetes mellitus without complication (HCC)    History of cardiac catheterization 2011   [facility]   Hyperlipidemia    Hypertension    Hypertriglyceridemia    Neuromuscular disorder [facility]) October 2017   Diagonis Parkinson   Parkinson's disease [facility])    Pneumonia 12/28/2020   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 LIMA to LAD, SVG to D1, OM1 and RPDA   CORONARY ARTERY BYPASS GRAFT  03/1998   5 vessel, [facility]   RIGHT HEART CATH N/A 04/04/2019   Procedure: RIGHT HEART CATH;  Surgeon: Wenona Hamilton, MD;  Location: [facility] INVASIVE CV LAB;  Service: Cardiovascular;  Laterality: N/A;   RIGHT/LEFT HEART CATH AND CORONARY ANGIOGRAPHY N/A 02/01/2018   Procedure: RIGHT/LEFT HEART CATH AND CORONARY ANGIOGRAPHY;  Surgeon: Wenona Hamilton, MD;  Location: [facility] INVASIVE CV LAB;  Service: Cardiovascular;  Laterality: N/A;   Patient Active Problem List   Diagnosis Date Noted   Diverticulitis 07/26/2023   RSV (respiratory syncytial virus pneumonia)  07/26/2023   Impaired ambulation 07/02/2023   Orthostatic hypotension 05/22/2023   Generalized weakness 05/07/2021   Bradycardia    Anemia, unspecified 02/07/2021   Neutropenia (HCC) 01/29/2021   Thrombocytopenia (HCC) 01/29/2021   Right lower lobe pneumonia 12/28/2020   Hyponatremia 12/22/2020   Bilateral leg weakness 12/20/2020   Prostate cancer screening 09/08/2020   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) 09/06/2020   Low back pain 08/19/2019   Pulmonary hypertension (HCC)    Insomnia 12/21/2018   Sleep apnea in adult 12/09/2018   Periodic limb movement disorder 12/09/2018   Pulmonary nodules 05/18/2018   Wears hearing aid in both ears 05/17/2018   Leg pain, bilateral 02/20/2018   Dyspnea    CKD stage 3a, GFR 45-59 ml/min (HCC) 09/21/2017   History of skin cancer in adulthood 08/14/2016   Parkinson's disease (HCC) 08/09/2016   Bilateral carotid artery stenosis 04/02/2015   Vertigo, peripheral 10/17/2014   Benign prostatic hyperplasia with urinary frequency 01/31/2014   Obesity 04/03/2013   Other malaise and fatigue 09/21/2012   Hyperlipidemia    Essential hypertension    3-vessel coronary artery disease    S/P CABG x 5     ONSET DATE: 07/03/23  REFERRING DIAG: W09.811 (ICD-10-CM) - Weakness of both  lower extremities   THERAPY DIAG:  Muscle weakness (generalized)  Other lack of coordination  Unsteadiness on feet  Difficulty in walking, not elsewhere classified  Abnormality of gait and mobility  Other low back pain  Other abnormalities of gait and mobility  Weakness of both hips  Rationale for Evaluation and Treatment: Rehabilitation  SUBJECTIVE:                                                                                                                                                                                             SUBJECTIVE STATEMENT:  Pt reports feeling tired today but no falls or LOB since last date. Went to  rock steady yesterday. Had a good trip to [facility] to visit his child and grandchildren.   Pt accompanied by: significant other  PERTINENT HISTORY:  From prior PT evaluation:  PD, history of COVID and significant weakness present following this diagnosis and the use of paxlovid ( August 5 dx date, thinks Paxlovid may have exacerbated PD symptoms)    Pt reports having COVID and having significant weakness.  Patient reports he also has back pain that is not present for a long time but was exacerbated more recently.  Patient previously did physical therapy for his back and experienced some relief but has not been consistent with the exercises.  Patient also has history of going to Parkinson's rock steady classes multiple times per week prior to onset of his COVID but he has not been back since. Patient previously ambulated without an assistive device but is now ambulating with a straight point cane.  Patient reports increased foot and ankle weakness in comparison with his hips and knees.  Patient also reports low back pain that is exacerbated with prolonged standing.  PAIN:  Are you having pain? No  PRECAUTIONS: Fall  RED FLAGS: None   WEIGHT BEARING RESTRICTIONS: No  FALLS: Has patient fallen in last 6 months? Yes. Number of falls 6  LIVING ENVIRONMENT: Lives with: lives with their spouse Lives in: House/apartment Stairs: Yes: Internal: 15 steps; on left going up and External: 1 steps; none Has following equipment at home: Quad cane large base and Walker - 2 wheeled  PLOF: Independent  PATIENT GOALS: to improve overall strength, get back to walking more regularly  OBJECTIVE:  Note: Objective measures were completed at Evaluation unless otherwise noted.  DIAGNOSTIC FINDINGS:   EXAM: CT HEAD WITHOUT CONTRAST  IMPRESSION: 1. No acute intracranial process. 2. Mild chronic small vessel ischemic changes. 3. Acute right frontal and maxillary sinusitis.  COGNITION: Overall cognitive  status: Within functional limits for tasks assessed   SENSATION:  WFL  COORDINATION: WFL    LOWER EXTREMITY ROM:     Active  Right Eval Left Eval  Hip flexion    Hip extension    Hip abduction    Hip adduction    Hip internal rotation    Hip external rotation    Knee flexion    Knee extension    Ankle dorsiflexion    Ankle plantarflexion    Ankle inversion    Ankle eversion     (Blank rows = not tested)  LOWER EXTREMITY MMT:    MMT Right Eval Left Eval  Hip flexion 4 4  Hip abduction 4 4  Hip adduction 4 4  Knee flexion 3+ 3+  Knee extension 4- 4-  Ankle dorsiflexion 4+ 4  (Blank rows = not tested)  BED MOBILITY:  No limitations according to the pt.  TRANSFERS: Assistive device utilized: None  Sit to stand: Complete Independence Stand to sit: Complete Independence Chair to chair: Complete Independence Floor: Not tested  FUNCTIONAL TESTS:  5 times sit to stand: 15.03 sec Timed up and go (TUG): 13.38 sec 6 minute walk test: TBD 10 meter walk test: 12.15 sec; 0.82 m/s Dynamic Gait Index: TBD  PATIENT SURVEYS:  ABC scale 70.6%                                                                                                                              TREATMENT DATE: 11/09/23  Octane leg press x 8 min with rest at level 3. Cues for BLE only iin leg press mode. Pt reports feeling that this is more tiring than usual.   MiniBEST Assesment- Balance Evaluation Systems Test  [facility] PT Assessment - 11/09/23 0001       Mini-BESTest   Sit To Stand Normal: Comes to stand without use of hands and stabilizes independently.    Rise to Toes Normal: Stable for 3 s with maximum height.    Stand on one leg (left) Moderate: < 20 s    Stand on one leg (right) Normal: 20 s.    Stand on one leg - lowest score 1    Compensatory Stepping Correction - Forward Normal: Recovers independently with a single, large step (second realignement is allowed).    Compensatory  Stepping Correction - Backward Moderate: More than one step is required to recover equilibrium    Compensatory Stepping Correction - Left Lateral Moderate: Several steps to recover equilibrium    Compensatory Stepping Correction - Right Lateral Moderate: Several steps to recover equilibrium    Stepping Corredtion Lateral - lowest score 1    Stance - Feet together, eyes open, firm surface  Normal: 30s    Stance - Feet together, eyes closed, foam surface  Normal: 30s    Incline - Eyes Closed Moderate: Stands independently < 30s OR aligns with surface    Change in Gait Speed Normal: Significantly changes walkling speed without imbalance    Walk with  head turns - Horizontal Moderate: performs head turns with reduction in gait speed.    Walk with pivot turns Normal: Turns with feet close FAST (< 3 steps) with good balance.    Step over obstacles Normal: Able to step over box with minimal change of gait speed and with good balance.    Timed UP & GO with Dual Task Moderate: Dual Task affects either counting OR walking (>10%) when compared to the TUG without Dual Task.   10.03 normal; 10.93 Cognitive   Mini-BEST total score 22             PT treatment then moved to Well zone  Leg press plate 6, 4 [id number]  Seated row plate 4 4 x 8  Seated pull down plate 4 3 x 8   TRX row 2 x 12   Cues for full ROM and to allow adequate recovery between sets.    PATIENT EDUCATION: Education details: Pt educated throughout session about proper posture and technique with exercises. Improved exercise technique, movement at target joints, use of target muscles after min to mod verbal, visual, tactile cues  Person educated: Patient and Spouse Education method: Explanation, Demonstration, and Verbal cues Education comprehension: verbalized understanding  HOME EXERCISE PROGRAM: Seated PWR! Moves x 10 ea, handout provided   GOALS: Goals reviewed with patient? Yes  SHORT TERM GOALS: Target date:  09/14/2023    Pt will be independent with HEP in order to demonstrate increased ability to perform tasks related to occupation/hobbies. Baseline:  Pt to be given HEP at next visit. Goal status: INITIAL  LONG TERM GOALS: Target date: 11/09/2023  1.  Patient (> 42 years old) will complete five times sit to stand test in < 15 seconds indicating an increased LE strength and improved balance. Baseline: 15.03 sec 4/14: 11.79 sec  5/29: 11.06 sec Goal status: MET  2.  Patient will increase their ABC scale score to be 80.6% (an increase of 10 points), demonstrating improved balance confidence and reduced fall risk, as measured through weekly assessments. Baseline: 70.6% 4/14:72 % 5/29: 74% Goal status: INITIAL   3.  Patient will increase Berg Balance score by > 6 points to demonstrate decreased fall risk during functional activities. Baseline: 43 on 3/13 4/14: 49 5/29: 56 Goal status: MET   4.  Patient will reduce timed up and go to <11 seconds to reduce fall risk and demonstrate improved transfer/gait ability. Baseline: 13.38 sec 4/14: 11.4 sec no AD 5/29: 10.78 sec Goal status: MET  5.  Patient will increase 10 meter walk test to >1.63m/s as to improve gait speed for better community ambulation and to reduce fall risk. Baseline: 12.15 sec; 0.82 m/s 5/29: 1.06 m/s Goal status: MET  6.  Patient will increase six minute walk test distance to >1000 for progression to community ambulator and improve gait ability Baseline: 459 ft with RW stopping at 5:30 due to fatigue  09/21/23:1128 ft with 4WW  Goal status: MET  7.  Patient will increased Minibest by at least 3 points  to reduce fall risk and demonstrate improved transfer/gait ability. Baseline: 11/09/23: 22 Goal status: initial.    ASSESSMENT:  CLINICAL IMPRESSION:  Patient presents for PT treatment motivated to participate. Completed Minibest complete; demonstrating difficulty with balance on unlevel surface and fall recovery posteriorly  and laterally. Well Zone BLE and BUE strengthening an power with emphasis on improved posture. Education for proper recovery time between set of >30-45 sec per exercise. Pt will continue to benefit  from skilled physical therapy intervention to address impairments, improve QOL, and attain therapy goals.     OBJECTIVE IMPAIRMENTS: Abnormal gait, decreased activity tolerance, decreased balance, decreased endurance, decreased knowledge of use of DME, decreased mobility, difficulty walking, decreased ROM, decreased strength, and decreased safety awareness.   ACTIVITY LIMITATIONS: carrying, lifting, bending, standing, squatting, bathing, and locomotion level  PARTICIPATION LIMITATIONS: cleaning, laundry, driving, shopping, community activity, and yard work  PERSONAL FACTORS: Age, Education, Past/current experiences, Time since onset of injury/illness/exacerbation, and 3+ comorbidities: orthostatic HTN, syncope, falls, anemia, insomnia, dyspnea are also affecting patient's functional outcome.   REHAB POTENTIAL: Fair pt has been seen in the past.  CLINICAL DECISION MAKING: Evolving/moderate complexity  EVALUATION COMPLEXITY: Moderate  PLAN:  PT FREQUENCY: 2x/week  PT DURATION: 12 weeks  PLANNED INTERVENTIONS: 97110-Therapeutic exercises, 97530- Therapeutic activity, 97112- Neuromuscular re-education, 97535- Self Care, [id number]- Manual therapy, [phone]- Gait training, Balance training, Stair training, and Vestibular training  PLAN FOR NEXT SESSION:  Continue  well zone training as appropriate   Balance and strength interventions PD specific interventions as indicated   Barbara Book PT ,DPT Physical Therapist- [facility]  [facility]  11/09/23, 11:53 AM

## 2023-11-12 ENCOUNTER — Ambulatory Visit

## 2023-11-12 ENCOUNTER — Ambulatory Visit: Payer: Medicare Other | Admitting: Physical Therapy

## 2023-11-12 DIAGNOSIS — M5459 Other low back pain: Secondary | ICD-10-CM | POA: Diagnosis not present

## 2023-11-12 DIAGNOSIS — R278 Other lack of coordination: Secondary | ICD-10-CM | POA: Diagnosis not present

## 2023-11-12 DIAGNOSIS — R269 Unspecified abnormalities of gait and mobility: Secondary | ICD-10-CM

## 2023-11-12 DIAGNOSIS — M6281 Muscle weakness (generalized): Secondary | ICD-10-CM

## 2023-11-12 DIAGNOSIS — R262 Difficulty in walking, not elsewhere classified: Secondary | ICD-10-CM | POA: Diagnosis not present

## 2023-11-12 DIAGNOSIS — R2681 Unsteadiness on feet: Secondary | ICD-10-CM

## 2023-11-12 NOTE — Therapy (Signed)
 OUTPATIENT OCCUPATIONAL THERAPY NEURO TREATMENT NOTE  Patient Name: Johnathan Arnold. MRN: [id number] DOB:[birth year], 78 y.o., male Today's Date: 11/14/2023  PCP: Dr. Creta Dolin REFERRING PROVIDER: Dr. Creta Dolin  END OF SESSION:  OT End of Session - 11/14/23 1001     Visit Number 3    Number of Visits 24    Date for OT Re-Evaluation 01/28/24    OT Start Time 1145    OT Stop Time 1230    OT Time Calculation (min) 45 min    Activity Tolerance Patient tolerated treatment well    Behavior During Therapy [facility] for tasks assessed/performed             Past Medical History:  Diagnosis Date   3-vessel coronary artery disease    s/p  5 vessel CABG   Allergy June 2024   Diabetes mellitus without complication (HCC)    History of cardiac catheterization 2011   [facility]   Hyperlipidemia    Hypertension    Hypertriglyceridemia    Neuromuscular disorder [facility]) October 2017   Diagonis Parkinson   Parkinson's disease [facility])    Pneumonia 12/28/2020   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 LIMA to LAD, SVG to D1, OM1 and RPDA   CORONARY ARTERY BYPASS GRAFT  03/1998   5 vessel, [facility]   RIGHT HEART CATH N/A 04/04/2019   Procedure: RIGHT HEART CATH;  Surgeon: Wenona Hamilton, MD;  Location: [facility] INVASIVE CV LAB;  Service: Cardiovascular;  Laterality: N/A;   RIGHT/LEFT HEART CATH AND CORONARY ANGIOGRAPHY N/A 02/01/2018   Procedure: RIGHT/LEFT HEART CATH AND CORONARY ANGIOGRAPHY;  Surgeon: Wenona Hamilton, MD;  Location: [facility] INVASIVE CV LAB;  Service: Cardiovascular;  Laterality: N/A;   Patient Active Problem List   Diagnosis Date Noted   Diverticulitis 07/26/2023   RSV (respiratory syncytial virus pneumonia) 07/26/2023   Impaired ambulation 07/02/2023   Orthostatic hypotension 05/22/2023   Generalized weakness 05/07/2021   Bradycardia    Anemia, unspecified  02/07/2021   Neutropenia (HCC) 01/29/2021   Thrombocytopenia (HCC) 01/29/2021   Right lower lobe pneumonia 12/28/2020   Hyponatremia 12/22/2020   Bilateral leg weakness 12/20/2020   Prostate cancer screening 09/08/2020   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) 09/06/2020   Low back pain 08/19/2019   Pulmonary hypertension (HCC)    Insomnia 12/21/2018   Sleep apnea in adult 12/09/2018   Periodic limb movement disorder 12/09/2018   Pulmonary nodules 05/18/2018   Wears hearing aid in both ears 05/17/2018   Leg pain, bilateral 02/20/2018   Dyspnea    CKD stage 3a, GFR 45-59 ml/min (HCC) 09/21/2017   History of skin cancer in adulthood 08/14/2016   Parkinson's disease (HCC) 08/09/2016   Bilateral carotid artery stenosis 04/02/2015   Vertigo, peripheral 10/17/2014   Benign prostatic hyperplasia with urinary frequency 01/31/2014   Obesity 04/03/2013   Other malaise and fatigue 09/21/2012   Hyperlipidemia    Essential hypertension    3-vessel coronary artery disease    S/P CABG x 5    ONSET DATE: 07/03/23 (hospitalization); diagnosed with PD 2017  REFERRING DIAG: Parkinson's Disease  THERAPY DIAG:  Muscle weakness (generalized)  Other lack of coordination  Rationale for Evaluation and Treatment: Rehabilitation  SUBJECTIVE:  SUBJECTIVE STATEMENT: Pt reports sleeping 8p to 8am last night and feels he's having difficulty getting over his weakness/fatigue.  Pt accompanied by: self  PERTINENT HISTORY: Per chart on 08/07/23: [name] is a 78 y.o. male with medical history significant of CAD status post CABG, type 2 diabetes, Parkinson disease, hypertension presenting with weakness.     Patient noted to have been admitted 07/26/23, 07/02/23 for weakness and were treated for issues including right lower lobe pneumonia, RSV, diverticulitis.  Per the son, patient has had progressively worsening weakness and fatigue at home.  Has grown more lethargic over  the past 1 to 2 days PTA with patient not getting out of bed.   Pt also reports xx of L3 fx and pelvic fx.  PRECAUTIONS: Fall  WEIGHT BEARING RESTRICTIONS: No  PAIN: No pain at present  Are you having pain? Yes: NPRS scale: 0/10, can reach 6-7/10 pain Pain location: low back Pain description: achy Aggravating factors: prolonged standing Relieving factors: tylenol /rest   FALLS: Has patient fallen in last 6 months? Yes. Number of falls "several falls"   LIVING ENVIRONMENT: Lives with: lives with their spouse Lives in: House Stairs: 15 steps, 1 rail on the left going up, 1 external step without rails Has following equipment at home: shower chair, RW, comfort height toilet, large base quad cane   PLOF: Independent, handwriting legible for check writing and addressing envelopes prior to this Winter  PATIENT GOALS: "I hope to improve my writing or printing."   OBJECTIVE:  Note: Objective measures were completed at Evaluation unless otherwise noted.  HAND DOMINANCE: Right  ADLs: Overall ADLs: Pt reports decline in ADLs since this Winter after multiple hospitalizations Transfers/ambulation related to ADLs: intermittent use of a RW if back pain is present, mobility is slower without walker  Eating: tremors present when manipulation eating utensils (worse on the R but still does eat with the R); messy, food drops (ok with stabbing food but not balancing food on fork).  Spills frequently when drinking from a glass Grooming: able to shave with a blade but reports this to be slightly challenging  UB Dressing: difficulty with buttoning shirts; spouse buttons "most" of his shirts  LB Dressing: challenging to button and zip up slacks, able to tie shoes without difficulty Toileting: indep  Bathing: distant supv for a shower  Tub Shower transfers: distant supv using walk in shower with shower chair Equipment: see above  IADLs:             Shopping: Spouse manages as pt gets tired walking   Light housekeeping: spouse manages most of it; pt reports he can mop the kitchen floor  Meal Prep: pt can manage light meal prep; must use both hands to pour a drink pitcher and carries items in the L hand Community mobility: present today without AD; Pt reports that spouse mostly drives, but he is able Medication management: Pt reports being indep with pill set up with weekly pill organizer for pt and spouse's meds; very rarely reports dropping pills; pt reports he can be forgetful to take pills on occasion, and takes PD meds every 3 hours. Financial management: Pt manages but reports recent decline with legibility when writing checks.  Pt reports he writes 15-20 checks a month  and wrote a check 1-2 weeks ago that was illegible.  Pt states that he had to type out the address and return address on the envelope. Handwriting: Printed and signed name with good legibility today, but with mild micrographia  MOBILITY STATUS: See PT eval  ACTIVITY TOLERANCE:  Activity tolerance: WFL for tasks assessed; to  be further assessed in functional contexts Pt reports he has participated in [employer] 3 days a week since 2017  FUNCTIONAL OUTCOME MEASURES: TBD  UPPER EXTREMITY ROM:  WFL   UPPER EXTREMITY MMT:     MMT Right eval Left eval  Shoulder flexion 4+ 4+  Shoulder abduction 4+ 4+  Shoulder adduction    Shoulder extension    Shoulder internal rotation    Shoulder external rotation    Middle trapezius    Lower trapezius    Elbow flexion 5 5  Elbow extension 5 5  Wrist flexion 5 5  Wrist extension 5 5  Wrist ulnar deviation    Wrist radial deviation    Wrist pronation    Wrist supination    (Blank rows = not tested)  HAND FUNCTION: Grip strength: Right: 65 lbs; Left: 65 lbs, Lateral pinch: Right: 14 lbs, Left: 13 lbs, and 3 point pinch: Right: 12 lbs, Left: 10 lbs  COORDINATION: 9 Hole Peg test: Right: 31 sec; Left: 30 sec  SENSATION: BUEs WFL  EDEMA: No visible  edema  MUSCLE TONE: No noted rigidity or increased tone in BUEs  COGNITION: Overall cognitive status: WFL for tasks assessed, but pt does endorse some decreased memory  VISION: Cataracts removed both eyes a couple years ago; wears reading glasses   OBSERVATIONS:  Mild resting and action tremors in BUEs, R>L                                                                                              TREATMENT DATE: 11/12/23  Self Care: -Assisted with set up of phone alarms for increasing consistency with medication administration- alarm set 3x daily for carba dopa -Focus on handwriting strategies to increase legibility and minimize micrographia: -Reviewed benefits of finger flicks with encouragement to perform pre-writing and during rest breaks to promote improved motor planning when writing feels less fluid and small; min vc/demo for technique and to initiate  -Practiced trials of exaggerated letter height (using 2 lines instead of 1 on lined paper pad) with mod verbal and visual cues for reaching top and bottom line with each pen stroke. -Practiced printed name/signature/check writing with vc/visual cues for increasing letter height -Provided intermittent min verbal cues/demo for positioning forearms/elbows on table to increase distal stability when writing   PATIENT EDUCATION: Education details: Writing strategies Person educated: pt  Education method: explanation/vc/demo Education comprehension: verbalized understanding, demonstrated understanding, further training needed  HOME EXERCISE PROGRAM: -green theraputty -handwriting  GOALS: Goals reviewed with patient?   SHORT TERM GOALS: Target date: 12/17/23  Pt will be indep with HEP with use of visual handout for improving/maintaining bilat hand strength/coordination for daily tasks. Baseline: Eval: HEP not yet initiated. Goal status: INITIAL  2.  Pt will verbalize and implement 1-2 compensatory strategies to improve handwriting  legibility for writing checks with min vc or less. Baseline: Eval: Further training needed; mild micrographia and legibility is inconsistent  Goal status: INITIAL  LONG TERM GOALS: Target date: 01/28/24  Pt will write 3 consecutive checks for bill paying with 100% legibility.  Baseline: Eval: Pt reports that  he wrote a check 1-2 weeks ago that was illegible Goal status: INITIAL  2.  Pt will be indep to manage shirt and pants buttons and zippers with modified indep using AE as needed at least 75% of the time. Baseline: Eval: Pt reports difficulty with shirt and pants buttons and zippers and reports spouse buttons most of his shirts. Goal status: INITIAL  3.  Pt will report consistent medication adherence at least 5 of 7 days a week using a phone alarm or other external reminder. Baseline: Eval: Pt endorses memory deficits and needing to set up his phone alarm for med management, but states he got frustrated with an attempt at setting this up on his phone. Goal status: INITIAL   4.  Pt will verbalize and implement 1-2 compensatory strategies with min vc or less to reduce spilling and dropped food during meal times.    Baseline: Eval: Pt endorses messy eating d/t tremors/food dropping from utensils and drink spills   Goal status: INITIAL  ASSESSMENT:  CLINICAL IMPRESSION: Pt increasing self awareness with micrographia and implementing strategies to improve handwriting legibility with reduced micrographia.  OT provided intermittent min vc to initiate supportive positioning, finger flicks, and vc/visual cues for increasing letter height.  Phone alarms set today for med management as noted above.  Pt will continue to benefit from skilled OT for continued focus on handwriting skills, instruction in ADL compensation/AE, and HEP review in order to maximize indep with ADL/IADL tasks while adapting to physical/cognitive/functional changes with a neurodegenerative disease.   PERFORMANCE DEFICITS: in  functional skills including ADLs, IADLs, coordination, dexterity, strength, pain, Fine motor control, Gross motor control, mobility, endurance, decreased knowledge of use of DME, and UE functional use, cognitive skills including emotional, memory, problem solving, and thought, and psychosocial skills including coping strategies, environmental adaptation, habits, and routines and behaviors.   IMPAIRMENTS: are limiting patient from ADLs, IADLs, and leisure.   CO-MORBIDITIES: has co-morbidities such as   that affects occupational performance. Patient will benefit from skilled OT to address above impairments and improve overall function.  MODIFICATION OR ASSISTANCE TO COMPLETE EVALUATION: Min-Moderate modification of tasks or assist with assess necessary to complete an evaluation.  OT OCCUPATIONAL PROFILE AND HISTORY: Detailed assessment: Review of records and additional review of physical, cognitive, psychosocial history related to current functional performance.  CLINICAL DECISION MAKING: Moderate - several treatment options, min-mod task modification necessary  REHAB POTENTIAL: Good  EVALUATION COMPLEXITY: Moderate    PLAN:  OT FREQUENCY: 2x/week  OT DURATION: 12 weeks  PLANNED INTERVENTIONS: 97168 OT Re-evaluation, 97535 self care/ADL training, 16109 therapeutic exercise, 97530 therapeutic activity, 97112 neuromuscular re-education, 97140 manual therapy, 97010 moist heat, psychosocial skills training, energy conservation, coping strategies training, patient/family education, and DME and/or AE instructions  RECOMMENDED OTHER SERVICES: None at this time (pt currently working with PT)  CONSULTED AND AGREED WITH PLAN OF CARE: Patient and family member/caregiver  PLAN FOR NEXT SESSION: See above  Marcus Sewer, MS, OTR/L  Casandra Claw, OT 11/14/2023, 10:06 AM

## 2023-11-12 NOTE — Therapy (Signed)
 OUTPATIENT PHYSICAL THERAPY NEURO TREATMENT/RECERT   Patient Name: Johnathan Arnold. MRN: [id number] DOB:1945/12/25, 78 y.o., male Today's Date: 11/12/2023  PCP: Thersia Flax, MD  REFERRING PROVIDER: Thersia Flax, MD   END OF SESSION:  PT End of Session - 11/12/23 1105     Visit Number 22    Number of Visits 24    Date for PT Re-Evaluation 01/07/24    Authorization Type Medicare, BCBS supplement    Progress Note Due on Visit 30    PT Start Time 1102    PT Stop Time 1143    PT Time Calculation (min) 41 min    Equipment Utilized During Treatment Gait belt    Activity Tolerance Patient tolerated treatment well    Behavior During Therapy [facility] for tasks assessed/performed                         Past Medical History:  Diagnosis Date   3-vessel coronary artery disease    s/p  5 vessel CABG   Allergy June 2024   Diabetes mellitus without complication (HCC)    History of cardiac catheterization 2011   [facility]   Hyperlipidemia    Hypertension    Hypertriglyceridemia    Neuromuscular disorder [facility]) October 2017   Diagonis Parkinson   Parkinson's disease [facility])    Pneumonia 12/28/2020   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 LIMA to LAD, SVG to D1, OM1 and RPDA   CORONARY ARTERY BYPASS GRAFT  03/1998   5 vessel, [facility]   RIGHT HEART CATH N/A 04/04/2019   Procedure: RIGHT HEART CATH;  Surgeon: Wenona Hamilton, MD;  Location: [facility] INVASIVE CV LAB;  Service: Cardiovascular;  Laterality: N/A;   RIGHT/LEFT HEART CATH AND CORONARY ANGIOGRAPHY N/A 02/01/2018   Procedure: RIGHT/LEFT HEART CATH AND CORONARY ANGIOGRAPHY;  Surgeon: Wenona Hamilton, MD;  Location: [facility] INVASIVE CV LAB;  Service: Cardiovascular;  Laterality: N/A;   Patient Active Problem List   Diagnosis Date Noted   Diverticulitis 07/26/2023   RSV (respiratory syncytial virus  pneumonia) 07/26/2023   Impaired ambulation 07/02/2023   Orthostatic hypotension 05/22/2023   Generalized weakness 05/07/2021   Bradycardia    Anemia, unspecified 02/07/2021   Neutropenia (HCC) 01/29/2021   Thrombocytopenia (HCC) 01/29/2021   Right lower lobe pneumonia 12/28/2020   Hyponatremia 12/22/2020   Bilateral leg weakness 12/20/2020   Prostate cancer screening 09/08/2020   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) 09/06/2020   Low back pain 08/19/2019   Pulmonary hypertension (HCC)    Insomnia 12/21/2018   Sleep apnea in adult 12/09/2018   Periodic limb movement disorder 12/09/2018   Pulmonary nodules 05/18/2018   Wears hearing aid in both ears 05/17/2018   Leg pain, bilateral 02/20/2018   Dyspnea    CKD stage 3a, GFR 45-59 ml/min (HCC) 09/21/2017   History of skin cancer in adulthood 08/14/2016   Parkinson's disease (HCC) 08/09/2016   Bilateral carotid artery stenosis 04/02/2015   Vertigo, peripheral 10/17/2014   Benign prostatic hyperplasia with urinary frequency 01/31/2014   Obesity 04/03/2013   Other malaise and fatigue 09/21/2012   Hyperlipidemia    Essential hypertension    3-vessel coronary artery disease    S/P CABG x 5     ONSET DATE: 07/03/23  REFERRING DIAG: W09.811 (ICD-10-CM) - Weakness of  both lower extremities   THERAPY DIAG:  Muscle weakness (generalized)  Unsteadiness on feet  Difficulty in walking, not elsewhere classified  Abnormality of gait and mobility  Rationale for Evaluation and Treatment: Rehabilitation  SUBJECTIVE:                                                                                                                                                                                             SUBJECTIVE STATEMENT:  Pt reports feeling tired today but no falls or LOB since last date. Feels weak overall today in his legs.   Pt accompanied by: significant other  PERTINENT HISTORY:  From prior PT  evaluation:  PD, history of COVID and significant weakness present following this diagnosis and the use of paxlovid ( August 5 dx date, thinks Paxlovid may have exacerbated PD symptoms)    Pt reports having COVID and having significant weakness.  Patient reports he also has back pain that is not present for a long time but was exacerbated more recently.  Patient previously did physical therapy for his back and experienced some relief but has not been consistent with the exercises.  Patient also has history of going to Parkinson's rock steady classes multiple times per week prior to onset of his COVID but he has not been back since. Patient previously ambulated without an assistive device but is now ambulating with a straight point cane.  Patient reports increased foot and ankle weakness in comparison with his hips and knees.  Patient also reports low back pain that is exacerbated with prolonged standing.  PAIN:  Are you having pain? No  PRECAUTIONS: Fall  RED FLAGS: None   WEIGHT BEARING RESTRICTIONS: No  FALLS: Has patient fallen in last 6 months? Yes. Number of falls 6  LIVING ENVIRONMENT: Lives with: lives with their spouse Lives in: House/apartment Stairs: Yes: Internal: 15 steps; on left going up and External: 1 steps; none Has following equipment at home: Quad cane large base and Walker - 2 wheeled  PLOF: Independent  PATIENT GOALS: to improve overall strength, get back to walking more regularly  OBJECTIVE:  Note: Objective measures were completed at Evaluation unless otherwise noted.  DIAGNOSTIC FINDINGS:   EXAM: CT HEAD WITHOUT CONTRAST  IMPRESSION: 1. No acute intracranial process. 2. Mild chronic small vessel ischemic changes. 3. Acute right frontal and maxillary sinusitis.  COGNITION: Overall cognitive status: Within functional limits for tasks assessed   SENSATION: WFL  COORDINATION: WFL    LOWER EXTREMITY ROM:     Active  Right Eval Left Eval  Hip  flexion    Hip extension  Hip abduction    Hip adduction    Hip internal rotation    Hip external rotation    Knee flexion    Knee extension    Ankle dorsiflexion    Ankle plantarflexion    Ankle inversion    Ankle eversion     (Blank rows = not tested)  LOWER EXTREMITY MMT:    MMT Right Eval Left Eval  Hip flexion 4 4  Hip abduction 4 4  Hip adduction 4 4  Knee flexion 3+ 3+  Knee extension 4- 4-  Ankle dorsiflexion 4+ 4  (Blank rows = not tested)  BED MOBILITY:  No limitations according to the pt.  TRANSFERS: Assistive device utilized: None  Sit to stand: Complete Independence Stand to sit: Complete Independence Chair to chair: Complete Independence Floor: Not tested  FUNCTIONAL TESTS:  5 times sit to stand: 15.03 sec Timed up and go (TUG): 13.38 sec 6 minute walk test: TBD 10 meter walk test: 12.15 sec; 0.82 m/s Dynamic Gait Index: TBD  PATIENT SURVEYS:  ABC scale 70.6%                                                                                                                              TREATMENT DATE: 11/12/23  BP: 148/49  mmHg with HR 53  Self care: Instructed patient in importance of rest of lower extremity muscles between heart exercise bouts.  Instructed him in importance of this for bloating lower extremity strength.  Also addressed to that his weakness could be coming from another source but she would need to contact a different medical provider if the rest of the weekend is not improve his feeling of weakness in his legs bilaterally.  NMR Octane leg press x 6 min with rest at level 3. Cues for BLE only in leg press mode. Pt reports feeling that this is more tiring than usual.   TE Seated PWR up with GTB resistance 3 x 10 Seated row with black TB resistance 3 x 10 Seated TB ER with GTB 3x 10  NMR Kore balance trainer working on patients proprioception and standing balance on dynamic surface  Setting type(s): SLD ball  Reps:  2 Comments: improved reaction time throughout. On Post lob requiring PT support. No UE throughout     Cues for full ROM and to allow adequate recovery between sets.    PATIENT EDUCATION: Education details: Pt educated throughout session about proper posture and technique with exercises. Improved exercise technique, movement at target joints, use of target muscles after min to mod verbal, visual, tactile cues  Person educated: Patient and Spouse Education method: Explanation, Demonstration, and Verbal cues Education comprehension: verbalized understanding  HOME EXERCISE PROGRAM: Seated PWR! Moves x 10 ea, handout provided   GOALS: Goals reviewed with patient? Yes  SHORT TERM GOALS: Target date: 09/14/2023    Pt will be independent with HEP in order to demonstrate increased ability to perform tasks related  to occupation/hobbies. Baseline:  Pt to be given HEP at next visit. Goal status: INITIAL  LONG TERM GOALS: Target date: 11/09/2023  1.  Patient (> 71 years old) will complete five times sit to stand test in < 15 seconds indicating an increased LE strength and improved balance. Baseline: 15.03 sec 4/14: 11.79 sec  5/29: 11.06 sec Goal status: MET  2.  Patient will increase their ABC scale score to be 80.6% (an increase of 10 points), demonstrating improved balance confidence and reduced fall risk, as measured through weekly assessments. Baseline: 70.6% 4/14:72 % 5/29: 74% Goal status: INITIAL   3.  Patient will increase Berg Balance score by > 6 points to demonstrate decreased fall risk during functional activities. Baseline: 43 on 3/13 4/14: 49 5/29: 56 Goal status: MET   4.  Patient will reduce timed up and go to <11 seconds to reduce fall risk and demonstrate improved transfer/gait ability. Baseline: 13.38 sec 4/14: 11.4 sec no AD 5/29: 10.78 sec Goal status: MET  5.  Patient will increase 10 meter walk test to >1.74m/s as to improve gait speed for better community  ambulation and to reduce fall risk. Baseline: 12.15 sec; 0.82 m/s 5/29: 1.06 m/s Goal status: MET  6.  Patient will increase six minute walk test distance to >1000 for progression to community ambulator and improve gait ability Baseline: 459 ft with RW stopping at 5:30 due to fatigue  09/21/23:1128 ft with 4WW  Goal status: MET  7.  Patient will increased Minibest by at least 3 points  to reduce fall risk and demonstrate improved transfer/gait ability. Baseline: 11/09/23: 22 Goal status: initial.   8.  Patient will be independent with gym-based exercise program that he can complete 2-3 times weekly to build his lower extremity strength and postural strength Baseline: no gym program, wants to continue to progress leg strength  Goal status: Initial    ASSESSMENT:  CLINICAL IMPRESSION:  Patient arrived with good motivation for completion of pt activities. Pt having soreness and unexplained weakness in his B LE so did mostly balance and postural exercise this date. Pt does well on KORE balance machine and shows improved reactions throughout. Pt does well with postural strength interventions. He was instructed to rest LE over weekend to see if rest from exercise bring any relief to weakness.  Patient's mini BEST test was assessed last visit and showed some deficits in balance as mentioned in that note.  Also added goal for patient to be independent with gym-based exercise program to continue to build his lower extremity strength when he discharges from physical therapy.  Pt will continue to benefit from skilled physical therapy intervention to address impairments, improve QOL, and attain therapy goals. Patient's condition has the potential to improve in response to therapy. Maximum improvement is yet to be obtained. The anticipated improvement is attainable and reasonable in a generally predictable time.        OBJECTIVE IMPAIRMENTS: Abnormal gait, decreased activity tolerance, decreased balance,  decreased endurance, decreased knowledge of use of DME, decreased mobility, difficulty walking, decreased ROM, decreased strength, and decreased safety awareness.   ACTIVITY LIMITATIONS: carrying, lifting, bending, standing, squatting, bathing, and locomotion level  PARTICIPATION LIMITATIONS: cleaning, laundry, driving, shopping, community activity, and yard work  PERSONAL FACTORS: Age, Education, Past/current experiences, Time since onset of injury/illness/exacerbation, and 3+ comorbidities: orthostatic HTN, syncope, falls, anemia, insomnia, dyspnea are also affecting patient's functional outcome.   REHAB POTENTIAL: Fair pt has been seen in the past.  CLINICAL  DECISION MAKING: Evolving/moderate complexity  EVALUATION COMPLEXITY: Moderate  PLAN:  PT FREQUENCY: 2x/week  PT DURATION: 8 weeks  PLANNED INTERVENTIONS: 97110-Therapeutic exercises, 97530- Therapeutic activity, 97112- Neuromuscular re-education, 97535- Self Care, [id number]- Manual therapy, [phone]- Gait training, Balance training, Stair training, and Vestibular training  PLAN FOR NEXT SESSION:  Continue  well zone training as appropriate   Balance and strength interventions PD specific interventions as indicated   Edwina Gram PT ,DPT Physical Therapist- [place]  [facility]  11/12/23, 11:05 AM

## 2023-11-16 ENCOUNTER — Ambulatory Visit: Payer: Medicare Other | Admitting: Physical Therapy

## 2023-11-16 ENCOUNTER — Ambulatory Visit

## 2023-11-16 DIAGNOSIS — R2689 Other abnormalities of gait and mobility: Secondary | ICD-10-CM

## 2023-11-16 DIAGNOSIS — M6281 Muscle weakness (generalized): Secondary | ICD-10-CM

## 2023-11-16 DIAGNOSIS — R278 Other lack of coordination: Secondary | ICD-10-CM

## 2023-11-16 DIAGNOSIS — R262 Difficulty in walking, not elsewhere classified: Secondary | ICD-10-CM | POA: Diagnosis not present

## 2023-11-16 DIAGNOSIS — R269 Unspecified abnormalities of gait and mobility: Secondary | ICD-10-CM | POA: Diagnosis not present

## 2023-11-16 DIAGNOSIS — R2681 Unsteadiness on feet: Secondary | ICD-10-CM

## 2023-11-16 DIAGNOSIS — M5459 Other low back pain: Secondary | ICD-10-CM | POA: Diagnosis not present

## 2023-11-16 DIAGNOSIS — R29898 Other symptoms and signs involving the musculoskeletal system: Secondary | ICD-10-CM

## 2023-11-16 NOTE — Therapy (Signed)
 OUTPATIENT PHYSICAL THERAPY NEURO TREATMENT/RECERT   Patient Name: Johnathan Arnold. MRN: [id number] DOB:[birth year], 78 y.o., male Today's Date: 11/16/2023  PCP: Thersia Flax, MD  REFERRING PROVIDER: Thersia Flax, MD   END OF SESSION:  PT End of Session - 11/16/23 1138     Visit Number 23    Number of Visits 24    Date for PT Re-Evaluation 01/07/24    Authorization Type Medicare, BCBS supplement    Progress Note Due on Visit 30    PT Start Time 1145    PT Stop Time 1225    PT Time Calculation (min) 40 min    Equipment Utilized During Treatment Gait belt    Activity Tolerance Patient tolerated treatment well    Behavior During Therapy 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 LIMA to LAD, SVG to D1, OM1 and RPDA   CORONARY ARTERY BYPASS GRAFT  03/1998   5 vessel, [facility]   RIGHT HEART CATH N/A 04/04/2019   Procedure: RIGHT HEART CATH;  Surgeon: Wenona Hamilton, MD;  Location: [facility] INVASIVE CV LAB;  Service: Cardiovascular;  Laterality: N/A;   RIGHT/LEFT HEART CATH AND CORONARY ANGIOGRAPHY N/A 02/01/2018   Procedure: RIGHT/LEFT HEART CATH AND CORONARY ANGIOGRAPHY;  Surgeon: Wenona Hamilton, MD;  Location: [facility] INVASIVE CV LAB;  Service: Cardiovascular;  Laterality: N/A;   Patient Active Problem List   Diagnosis Date Noted   Diverticulitis 07/26/2023   RSV (respiratory syncytial virus  pneumonia) 07/26/2023   Impaired ambulation 07/02/2023   Orthostatic hypotension 05/22/2023   Generalized weakness 05/07/2021   Bradycardia    Anemia, unspecified 02/07/2021   Neutropenia (HCC) 01/29/2021   Thrombocytopenia (HCC) 01/29/2021   Right lower lobe pneumonia 12/28/2020   Hyponatremia 12/22/2020   Bilateral leg weakness 12/20/2020   Prostate cancer screening 09/08/2020   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) 09/06/2020   Low back pain 08/19/2019   Pulmonary hypertension (HCC)    Insomnia 12/21/2018   Sleep apnea in adult 12/09/2018   Periodic limb movement disorder 12/09/2018   Pulmonary nodules 05/18/2018   Wears hearing aid in both ears 05/17/2018   Leg pain, bilateral 02/20/2018   Dyspnea    CKD stage 3a, GFR 45-59 ml/min (HCC) 09/21/2017   History of skin cancer in adulthood 08/14/2016   Parkinson's disease (HCC) 08/09/2016   Bilateral carotid artery stenosis 04/02/2015   Vertigo, peripheral 10/17/2014   Benign prostatic hyperplasia with urinary frequency 01/31/2014   Obesity 04/03/2013   Other malaise and fatigue 09/21/2012   Hyperlipidemia    Essential hypertension    3-vessel coronary artery disease    S/P CABG x 5     ONSET DATE: 07/03/23  REFERRING DIAG: W10.272 (ICD-10-CM) - Weakness of  both lower extremities   THERAPY DIAG:  Muscle weakness (generalized)  Other lack of coordination  Unsteadiness on feet  Difficulty in walking, not elsewhere classified  Abnormality of gait and mobility  Other low back pain  Other abnormalities of gait and mobility  Weakness of both hips  Rationale for Evaluation and Treatment: Rehabilitation  SUBJECTIVE:                                                                                                                                                                                             SUBJECTIVE STATEMENT:  Pt reports feeling tired today and increased LE soreness, but no  falls or LOB since last date.   Pt accompanied by: significant other  PERTINENT HISTORY:  From prior PT evaluation:  PD, history of COVID and significant weakness present following this diagnosis and the use of paxlovid ( August 5 dx date, thinks Paxlovid may have exacerbated PD symptoms)    Pt reports having COVID and having significant weakness.  Patient reports he also has back pain that is not present for a long time but was exacerbated more recently.  Patient previously did physical therapy for his back and experienced some relief but has not been consistent with the exercises.  Patient also has history of going to Parkinson's rock steady classes multiple times per week prior to onset of his COVID but he has not been back since. Patient previously ambulated without an assistive device but is now ambulating with a straight point cane.  Patient reports increased foot and ankle weakness in comparison with his hips and knees.  Patient also reports low back pain that is exacerbated with prolonged standing.  PAIN:  Are you having pain? No  PRECAUTIONS: Fall  RED FLAGS: None   WEIGHT BEARING RESTRICTIONS: No  FALLS: Has patient fallen in last 6 months? Yes. Number of falls 6  LIVING ENVIRONMENT: Lives with: lives with their spouse Lives in: House/apartment Stairs: Yes: Internal: 15 steps; on left going up and External: 1 steps; none Has following equipment at home: Quad cane large base and Walker - 2 wheeled  PLOF: Independent  PATIENT GOALS: to improve overall strength, get back to walking more regularly  OBJECTIVE:  Note: Objective measures were completed at Evaluation unless otherwise noted.  DIAGNOSTIC FINDINGS:   EXAM: CT HEAD WITHOUT CONTRAST  IMPRESSION: 1. No acute intracranial process. 2. Mild chronic small vessel ischemic changes. 3. Acute right frontal and maxillary sinusitis.  COGNITION: Overall cognitive status: Within functional limits for tasks  assessed   SENSATION: WFL  COORDINATION: WFL    LOWER EXTREMITY ROM:  Active  Right Eval Left Eval  Hip flexion    Hip extension    Hip abduction    Hip adduction    Hip internal rotation    Hip external rotation    Knee flexion    Knee extension    Ankle dorsiflexion    Ankle plantarflexion    Ankle inversion    Ankle eversion     (Blank rows = not tested)  LOWER EXTREMITY MMT:    MMT Right Eval Left Eval  Hip flexion 4 4  Hip abduction 4 4  Hip adduction 4 4  Knee flexion 3+ 3+  Knee extension 4- 4-  Ankle dorsiflexion 4+ 4  (Blank rows = not tested)  BED MOBILITY:  No limitations according to the pt.  TRANSFERS: Assistive device utilized: None  Sit to stand: Complete Independence Stand to sit: Complete Independence Chair to chair: Complete Independence Floor: Not tested  FUNCTIONAL TESTS:  5 times sit to stand: 15.03 sec Timed up and go (TUG): 13.38 sec 6 minute walk test: TBD 10 meter walk test: 12.15 sec; 0.82 m/s Dynamic Gait Index: TBD  PATIENT SURVEYS:  ABC scale 70.6%                                                                                                                              TREATMENT DATE: 11/16/23  NMR Octane leg press x 6 min with rest at level 3. Cues for BLE only in leg press mode. Pt reports feeling that this is more tiring than usual.   TA Seated PWR up with GTB resistance 2 x 10 Seated shoulder Horizontal abduction 2 x 10  Seated TB ER with GTB 2x 10 Seated PWR step x 15  Leg press 3 x 10 70-75#  NMR Kore balance trainer working on patients proprioception and standing balance on dynamic surface  Setting type(s): SLD ball 2 reps ; Tux racer 3 reps  Comments: improved reaction time throughout.  And well as improved weight shift from heel to forefoot and control with lateral movement  no overt LOB as pt utilized Intermittent UE support throughout     Cues for full ROM and to allow adequate recovery  between sets.    PATIENT EDUCATION: Education details: Pt educated throughout session about proper posture and technique with exercises. Improved exercise technique, movement at target joints, use of target muscles after min to mod verbal, visual, tactile cues  Person educated: Patient and Spouse Education method: Explanation, Demonstration, and Verbal cues Education comprehension: verbalized understanding  HOME EXERCISE PROGRAM: Seated PWR! Moves x 10 ea, handout provided   GOALS: Goals reviewed with patient? Yes  SHORT TERM GOALS: Target date: 09/14/2023    Pt will be independent with HEP in order to demonstrate increased ability to perform tasks related to occupation/hobbies. Baseline:  Pt to be given HEP at next visit. Goal status: INITIAL  LONG TERM GOALS: Target date: 11/09/2023  1.  Patient (>  64 years old) will complete five times sit to stand test in < 15 seconds indicating an increased LE strength and improved balance. Baseline: 15.03 sec 4/14: 11.79 sec  5/29: 11.06 sec Goal status: MET  2.  Patient will increase their ABC scale score to be 80.6% (an increase of 10 points), demonstrating improved balance confidence and reduced fall risk, as measured through weekly assessments. Baseline: 70.6% 4/14:72 % 5/29: 74% Goal status: INITIAL   3.  Patient will increase Berg Balance score by > 6 points to demonstrate decreased fall risk during functional activities. Baseline: 43 on 3/13 4/14: 49 5/29: 56 Goal status: MET   4.  Patient will reduce timed up and go to <11 seconds to reduce fall risk and demonstrate improved transfer/gait ability. Baseline: 13.38 sec 4/14: 11.4 sec no AD 5/29: 10.78 sec Goal status: MET  5.  Patient will increase 10 meter walk test to >1.18m/s as to improve gait speed for better community ambulation and to reduce fall risk. Baseline: 12.15 sec; 0.82 m/s 5/29: 1.06 m/s Goal status: MET  6.  Patient will increase six minute walk test distance to  >1000 for progression to community ambulator and improve gait ability Baseline: 459 ft with RW stopping at 5:30 due to fatigue  09/21/23:1128 ft with 4WW  Goal status: MET  7.  Patient will increased Minibest by at least 3 points  to reduce fall risk and demonstrate improved transfer/gait ability. Baseline: 11/09/23: 22 Goal status: initial.   8.  Patient will be independent with gym-based exercise program that he can complete 2-3 times weekly to build his lower extremity strength and postural strength Baseline: no gym program, wants to continue to progress leg strength  Goal status: Initial    ASSESSMENT:  CLINICAL IMPRESSION:  Patient arrived with good motivation for completion of pt activities. Continued treatment as initiated in prior session. Pt having soreness and unexplained weakness in his B LE so did mostly balance and postural exercise this date. Pt continues to perform well on KORE balance machine and shows improved reactions throughout. Pt does well with postural strength interventions. Continued pt to encourage proper resting and to seek medical attention if sensation does not improve.  Pt will continue to benefit from skilled physical therapy intervention to address impairments, improve QOL, and attain therapy goals.     OBJECTIVE IMPAIRMENTS: Abnormal gait, decreased activity tolerance, decreased balance, decreased endurance, decreased knowledge of use of DME, decreased mobility, difficulty walking, decreased ROM, decreased strength, and decreased safety awareness.   ACTIVITY LIMITATIONS: carrying, lifting, bending, standing, squatting, bathing, and locomotion level  PARTICIPATION LIMITATIONS: cleaning, laundry, driving, shopping, community activity, and yard work  PERSONAL FACTORS: Age, Education, Past/current experiences, Time since onset of injury/illness/exacerbation, and 3+ comorbidities: orthostatic HTN, syncope, falls, anemia, insomnia, dyspnea are also affecting  patient's functional outcome.   REHAB POTENTIAL: Fair pt has been seen in the past.  CLINICAL DECISION MAKING: Evolving/moderate complexity  EVALUATION COMPLEXITY: Moderate  PLAN:  PT FREQUENCY: 2x/week  PT DURATION: 8 weeks  PLANNED INTERVENTIONS: 97110-Therapeutic exercises, 97530- Therapeutic activity, 97112- Neuromuscular re-education, 97535- Self Care, 13244- Manual therapy, [phone]- Gait training, Balance training, Stair training, and Vestibular training  PLAN FOR NEXT SESSION:  Continue  well zone training as appropriate   Balance and strength interventions PD specific interventions as indicated   Barbara Book PT ,DPT Physical Therapist- [place]  [facility]  11/16/23, 11:40 AM

## 2023-11-16 NOTE — Progress Notes (Unsigned)
 Assessment/Plan:   1.  Parkinsons Disease, with worsening of symptoms when he has had systemic illness  -Patient's Parkinson's really looks adequately controlled today.  -Continue carbidopa /levodopa  25/100 to 2 at 8am, 2 at 11am, 2 at 2 pm, 1 at 5pm  -Continue carbidopa /levodopa  50/200 CR at bedtime.  -use walker at all times.  -Long discussion again with the patient today regarding Parkinson's disease and his response to illness.  Patient often will have worsening of symptoms of Parkinson's disease as he develops other illnesses.  However, we discussed that this is not the Parkinson's disease itself getting worse, but rather its reaction to other illnesses.  He always gets better as the other illnesses get better.  Today, he looks better than he did last visit and really looks quite good.  -We discussed that it used to be thought that levodopa  would increase risk of melanoma but now it is believed that Parkinsons itself likely increases risk of melanoma. he is to get regular skin checks.  He is doing this.  He sees Dr. Tresa Frohlich  -in PT now  - Discussed restarting rock steady boxing  2.  RLS/RBD  - ***His clonazepam  was stopped in the hospital when he was hospitalized for 1 episode of confusion.  He thinks the confusion was due to taking Tussionex/hydrocodone cough syrup after a prior hospitalization.  Nonetheless, I did not restart it right now.  -he has bed rails now and uses them  -melatonin 3 mg at bedtime.  3.  Sleep apnea and insomnia  -just restarted back with cpap  - Primary care has the patient now on trazodone .  4.  Pulmonary hypertension  -Follows with cardiology and pulmonary  5  MCI  -pt with neurocog testing in Jan, 2022 and demonstrated only MCI.  No evidence of a mood disorder.   6.  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  -Patient with syncope/near  syncope in primary care office May 22, 2023.  Patient had been sick with vomiting and diarrheal illness and stood up to go to the lab and had the episode.  This was likely due to dehydration associated with orthostasis.  7.  Carotid bruit, left  -u/s on 03/20/23 was normal  8.  Nocturia  -Follows with urology  - This is a source of insomnia  - Latest urology visit felt that not wearing the CPAP at night was contributing and has just started back Subjective:   Johnathan Arnold. was seen today in follow up for Parkinsons disease.  My previous records were reviewed prior to todays visit as well as outside records available to me. pts wife with patient and supplements the hx. I last saw the patient 8 weeks ago.  He has been in physical therapy consistently since that time.  Current prescribed movement disorder medications: Carbidopa /levodopa  25/100, 2/2/2/1 Carbidopa /levodopa  50/200 CR at bedtime    Prior meds:  entacapone  (thought it caused weakness, but had urinary tract infection with hospitalization and was discharged and had another hospitalization right after it for pneumonia); clonazepam  (I discontinued on one of his hospitalizations for weakness)    ALLERGIES:   Allergies  Allergen Reactions   Paxlovid [Nirmatrelvir-Ritonavir]     CURRENT MEDICATIONS:  Outpatient Encounter Medications as of 11/17/2023  Medication Sig   acetaminophen  (TYLENOL ) 500 MG tablet Take 500 mg by mouth as needed.   aspirin  EC 81 MG tablet Take 1 tablet (81  mg total) by mouth at bedtime.   carbidopa -levodopa  (SINEMET  CR) 50-200 MG tablet Take 1 tablet by mouth at bedtime.   carbidopa -levodopa  (SINEMET  IR) 25-100 MG tablet TAKE 2 TABLETS AT 8:00AM , 2 TABLETS AT 11:00AM, 2 TABLETS AT 2:00PM AND 1 TABLET AT 5:00PM   furosemide  (LASIX ) 20 MG tablet Take 1 tablet (20 mg total) by mouth daily as needed (for swelling).   hydrocortisone  cream 1 % Apply topically 2 (two) times daily.   isosorbide   mononitrate (IMDUR ) 60 MG 24 hr tablet TAKE 1 TABLET BY MOUTH DAILY   losartan  (COZAAR ) 50 MG tablet Take 1 tablet (50 mg total) by mouth in the morning and at bedtime.   melatonin 3 MG TABS tablet Take 1.5 mg by mouth at bedtime as needed.   Multiple Vitamin (MULTIVITAMIN) tablet Take 1 tablet by mouth daily.   nitroGLYCERIN  (NITROSTAT ) 0.4 MG SL tablet Place 1 tablet (0.4 mg total) under the tongue every 5 (five) minutes as needed for chest pain.   omeprazole  (PRILOSEC) 20 MG capsule Take 1 capsule (20 mg total) by mouth every morning.   ondansetron  (ZOFRAN ) 4 MG tablet Take 1 tablet (4 mg total) by mouth every 6 (six) hours as needed for nausea or vomiting.   polyethylene glycol (MIRALAX  / GLYCOLAX ) packet Take 17 g by mouth daily as needed.   rosuvastatin  (CRESTOR ) 10 MG tablet TAKE 1 TABLET BY MOUTH DAILY   tamsulosin  (FLOMAX ) 0.4 MG CAPS capsule Take 1 capsule (0.4 mg total) by mouth daily.   traZODone  (DESYREL ) 50 MG tablet Take 0.5-1 tablets (25-50 mg total) by mouth at bedtime as needed for sleep.   No facility-administered encounter medications on file as of 11/17/2023.    Objective:   PHYSICAL EXAMINATION:    VITALS:   There were no vitals filed for this visit.  Wt Readings from Last 3 Encounters:  09/15/23 156 lb 12.8 oz (71.1 kg)  09/14/23 158 lb 6.4 oz (71.8 kg)  09/01/23 154 lb (69.9 kg)      No data found.  GEN:  The patient appears stated age and is in NAD. HEENT:  Normocephalic, atraumatic.  The mucous membranes are moist. The superficial temporal arteries are without ropiness or tenderness.  CV:  regular Lungs: CTAB   Neurological examination:  Orientation: The patient is alert and oriented x3. Cranial nerves: There is good facial symmetry with minimal facial hypomimia. The speech is fluent and clear. Soft palate rises symmetrically and there is no tongue deviation. Hearing is slightly decreased to conversational tone. Sensation: Sensation is intact to light  touch throughout Motor: Strength is 5/5 in the bilateral UE/LE   Movement examination: Tone: There is nl tone in the ue/le Abnormal movements: there is intermittent RUE rest tremor, rare and mild Coordination:  There is no decremation with RAM's, with any form of RAMS, including alternating supination and pronation of the forearm, hand opening and closing, finger taps, heel taps and toe taps. Gait and Station: The patient is able to arise without the use of the hands on the 2nd attempt.   He ambulates really well in the hall  Total time spent on today's visit was *** minutes, including both face-to-face time and nonface-to-face time.  Time included that spent on review of records (prior notes available to me/labs/imaging if pertinent), discussing treatment and goals, answering patient's questions and coordinating care.    Cc:  Thersia Flax, MD

## 2023-11-16 NOTE — Therapy (Signed)
 OUTPATIENT OCCUPATIONAL THERAPY NEURO TREATMENT NOTE  Patient Name: Johnathan Arnold. MRN: [id number] DOB:[birth year], 78 y.o., male Today's Date: 11/16/2023  PCP: Dr. Creta Dolin REFERRING PROVIDER: Dr. Creta Dolin  END OF SESSION:  OT End of Session - 11/16/23 1131     Visit Number 4    Number of Visits 24    Date for OT Re-Evaluation 01/28/24    OT Start Time 1105    OT Stop Time 1145    OT Time Calculation (min) 40 min    Activity Tolerance Patient tolerated treatment well    Behavior During Therapy [facility] for tasks assessed/performed            Past Medical History:  Diagnosis Date   3-vessel coronary artery disease    s/p  5 vessel CABG   Allergy June 2024   Diabetes mellitus without complication (HCC)    History of cardiac catheterization 2011   [facility]   Hyperlipidemia    Hypertension    Hypertriglyceridemia    Neuromuscular disorder [facility]) October 2017   Diagonis Parkinson   Parkinson's disease [facility])    Pneumonia 12/28/2020   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 LIMA to LAD, SVG to D1, OM1 and RPDA   CORONARY ARTERY BYPASS GRAFT  03/1998   5 vessel, [facility]   RIGHT HEART CATH N/A 04/04/2019   Procedure: RIGHT HEART CATH;  Surgeon: Wenona Hamilton, MD;  Location: [facility] INVASIVE CV LAB;  Service: Cardiovascular;  Laterality: N/A;   RIGHT/LEFT HEART CATH AND CORONARY ANGIOGRAPHY N/A 02/01/2018   Procedure: RIGHT/LEFT HEART CATH AND CORONARY ANGIOGRAPHY;  Surgeon: Wenona Hamilton, MD;  Location: [facility] INVASIVE CV LAB;  Service: Cardiovascular;  Laterality: N/A;   Patient Active Problem List   Diagnosis Date Noted   Diverticulitis 07/26/2023   RSV (respiratory syncytial virus pneumonia) 07/26/2023   Impaired ambulation 07/02/2023   Orthostatic hypotension 05/22/2023   Generalized weakness 05/07/2021   Bradycardia    Anemia, unspecified  02/07/2021   Neutropenia (HCC) 01/29/2021   Thrombocytopenia (HCC) 01/29/2021   Right lower lobe pneumonia 12/28/2020   Hyponatremia 12/22/2020   Bilateral leg weakness 12/20/2020   Prostate cancer screening 09/08/2020   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) 09/06/2020   Low back pain 08/19/2019   Pulmonary hypertension (HCC)    Insomnia 12/21/2018   Sleep apnea in adult 12/09/2018   Periodic limb movement disorder 12/09/2018   Pulmonary nodules 05/18/2018   Wears hearing aid in both ears 05/17/2018   Leg pain, bilateral 02/20/2018   Dyspnea    CKD stage 3a, GFR 45-59 ml/min (HCC) 09/21/2017   History of skin cancer in adulthood 08/14/2016   Parkinson's disease (HCC) 08/09/2016   Bilateral carotid artery stenosis 04/02/2015   Vertigo, peripheral 10/17/2014   Benign prostatic hyperplasia with urinary frequency 01/31/2014   Obesity 04/03/2013   Other malaise and fatigue 09/21/2012   Hyperlipidemia    Essential hypertension    3-vessel coronary artery disease    S/P CABG x 5    ONSET DATE: 07/03/23 (hospitalization); diagnosed with PD 2017  REFERRING DIAG: Parkinson's Disease  THERAPY DIAG:  Muscle weakness (generalized)  Other lack of coordination  Rationale for Evaluation and Treatment: Rehabilitation  SUBJECTIVE:  SUBJECTIVE STATEMENT: Pt reports he wrote a few checks this week and they were done poorly. Pt accompanied by: self  PERTINENT HISTORY: Per chart on 08/07/23: Johnathan Arnold. is a 78 y.o. male with medical history significant of CAD status post CABG, type 2 diabetes, Parkinson disease, hypertension presenting with weakness.     Patient noted to have been admitted 07/26/23, 07/02/23 for weakness and were treated for issues including right lower lobe pneumonia, RSV, diverticulitis.  Per the son, patient has had progressively worsening weakness and fatigue at home.  Has grown more lethargic over the past 1 to 2 days PTA with patient not  getting out of bed.   Pt also reports xx of L3 fx and pelvic fx.  PRECAUTIONS: Fall  WEIGHT BEARING RESTRICTIONS: No  PAIN: No pain at present  Are you having pain? Yes: NPRS scale: 0/10, can reach 6-7/10 pain Pain location: low back Pain description: achy Aggravating factors: prolonged standing Relieving factors: tylenol /rest   FALLS: Has patient fallen in last 6 months? Yes. Number of falls "several falls"   LIVING ENVIRONMENT: Lives with: lives with their spouse Lives in: House Stairs: 15 steps, 1 rail on the left going up, 1 external step without rails Has following equipment at home: shower chair, RW, comfort height toilet, large base quad cane   PLOF: Independent, handwriting legible for check writing and addressing envelopes prior to this Winter  PATIENT GOALS: "I hope to improve my writing or printing."   OBJECTIVE:  Note: Objective measures were completed at Evaluation unless otherwise noted.  HAND DOMINANCE: Right  ADLs: Overall ADLs: Pt reports decline in ADLs since this Winter after multiple hospitalizations Transfers/ambulation related to ADLs: intermittent use of a RW if back pain is present, mobility is slower without walker  Eating: tremors present when manipulation eating utensils (worse on the R but still does eat with the R); messy, food drops (ok with stabbing food but not balancing food on fork).  Spills frequently when drinking from a glass Grooming: able to shave with a blade but reports this to be slightly challenging  UB Dressing: difficulty with buttoning shirts; spouse buttons "most" of his shirts  LB Dressing: challenging to button and zip up slacks, able to tie shoes without difficulty Toileting: indep  Bathing: distant supv for a shower  Tub Shower transfers: distant supv using walk in shower with shower chair Equipment: see above  IADLs:             Shopping: Spouse manages as pt gets tired walking  Light housekeeping: spouse manages most  of it; pt reports he can mop the kitchen floor  Meal Prep: pt can manage light meal prep; must use both hands to pour a drink pitcher and carries items in the L hand Community mobility: present today without AD; Pt reports that spouse mostly drives, but he is able Medication management: Pt reports being indep with pill set up with weekly pill organizer for pt and spouse's meds; very rarely reports dropping pills; pt reports he can be forgetful to take pills on occasion, and takes PD meds every 3 hours. Financial management: Pt manages but reports recent decline with legibility when writing checks.  Pt reports he writes 15-20 checks a month  and wrote a check 1-2 weeks ago that was illegible.  Pt states that he had to type out the address and return address on the envelope. Handwriting: Printed and signed name with good legibility today, but with mild micrographia  MOBILITY STATUS: See PT eval  ACTIVITY TOLERANCE:  Activity tolerance: WFL for tasks assessed; to be further assessed in functional  contexts Pt reports he has participated in [employer] 3 days a week since 2017  FUNCTIONAL OUTCOME MEASURES: TBD  UPPER EXTREMITY ROM:  WFL   UPPER EXTREMITY MMT:     MMT Right eval Left eval  Shoulder flexion 4+ 4+  Shoulder abduction 4+ 4+  Shoulder adduction    Shoulder extension    Shoulder internal rotation    Shoulder external rotation    Middle trapezius    Lower trapezius    Elbow flexion 5 5  Elbow extension 5 5  Wrist flexion 5 5  Wrist extension 5 5  Wrist ulnar deviation    Wrist radial deviation    Wrist pronation    Wrist supination    (Blank rows = not tested)  HAND FUNCTION: Grip strength: Right: 65 lbs; Left: 65 lbs, Lateral pinch: Right: 14 lbs, Left: 13 lbs, and 3 point pinch: Right: 12 lbs, Left: 10 lbs  COORDINATION: 9 Hole Peg test: Right: 31 sec; Left: 30 sec  SENSATION: BUEs WFL  EDEMA: No visible edema  MUSCLE TONE: No noted rigidity or  increased tone in BUEs  COGNITION: Overall cognitive status: WFL for tasks assessed, but pt does endorse some decreased memory  VISION: Cataracts removed both eyes a couple years ago; wears reading glasses   OBSERVATIONS:  Mild resting and action tremors in BUEs, R>L                                                                                              TREATMENT DATE: 11/16/23  Self Care: -Focus on handwriting strategies to increase legibility and minimize micrographia: -Min vc for increasing digit extension with each finger flick; good return demo; encouraged pt complete pre writing/during as needed  -Practiced printed name/signature/check writing, addressing an envelope for paying bills -Focus on exaggerated letter height with signature on a practice check, using imaginary line for letter height -Provided intermittent min verbal cues/demo for positioning forearms/elbows on table to increase distal stability when writing   Reviewed all compensatory strategies to be mindful of prior to initiating writing tasks at home: -Time of day/medication window -Finger flicks -positioning for distal stability -rubber pen grip -Use print instead of cursive for increasing legibility -attention focused on exaggerated letter height and letter spacing  -Practice some form of writing each day (ex: signing a receipt, leaving spouse a short note, writing grocery list, paying a bill)  PATIENT EDUCATION: Education details: Writing strategies Person educated: pt  Education method: explanation/vc/demo Education comprehension: verbalized understanding, demonstrated understanding, further training needed  HOME EXERCISE PROGRAM: -green theraputty -handwriting  GOALS: Goals reviewed with patient?   SHORT TERM GOALS: Target date: 12/17/23  Pt will be indep with HEP with use of visual handout for improving/maintaining bilat hand strength/coordination for daily tasks. Baseline: Eval: HEP not yet  initiated. Goal status: INITIAL  2.  Pt will verbalize and implement 1-2 compensatory strategies to improve handwriting legibility for writing checks with min vc or less. Baseline: Eval: Further training needed; mild micrographia and legibility is inconsistent  Goal status: INITIAL  LONG TERM GOALS: Target date: 01/28/24  Pt will write  3 consecutive checks for bill paying with 100% legibility.  Baseline: Eval: Pt reports that he wrote a check 1-2 weeks ago that was illegible Goal status: INITIAL  2.  Pt will be indep to manage shirt and pants buttons and zippers with modified indep using AE as needed at least 75% of the time. Baseline: Eval: Pt reports difficulty with shirt and pants buttons and zippers and reports spouse buttons most of his shirts. Goal status: INITIAL  3.  Pt will report consistent medication adherence at least 5 of 7 days a week using a phone alarm or other external reminder. Baseline: Eval: Pt endorses memory deficits and needing to set up his phone alarm for med management, but states he got frustrated with an attempt at setting this up on his phone. Goal status: INITIAL   4.  Pt will verbalize and implement 1-2 compensatory strategies with min vc or less to reduce spilling and dropped food during meal times.    Baseline: Eval: Pt endorses messy eating d/t tremors/food dropping from utensils and drink spills   Goal status: INITIAL  ASSESSMENT:  CLINICAL IMPRESSION: Pt continues to increase self awareness with micrographia and requires intermittent min vc for implementing strategies to improve handwriting legibility with reduced micrographia.  Pt reports poor legibility and shakiness with check writing over the weekend, reporting he has struggled with his PD symptoms over the last several days.  Pt receptive to all compensatory strategies reviewed above.  Continued reinforcement to practice some form of writing each day to re-calibrate nervous system to carry out  larger movement patterns.  OT provided intermittent min vc to initiate supportive positioning, finger flicks, and vc/visual cues for increasing letter height.  Pt reports increased consistency with medication adherence since phone alarms were set last week.  Pt will continue to benefit from skilled OT for continued focus on handwriting skills, instruction in ADL compensation/AE, and HEP review in order to maximize indep with ADL/IADL tasks while adapting to physical/cognitive/functional changes with a neurodegenerative disease.   PERFORMANCE DEFICITS: in functional skills including ADLs, IADLs, coordination, dexterity, strength, pain, Fine motor control, Gross motor control, mobility, endurance, decreased knowledge of use of DME, and UE functional use, cognitive skills including emotional, memory, problem solving, and thought, and psychosocial skills including coping strategies, environmental adaptation, habits, and routines and behaviors.   IMPAIRMENTS: are limiting patient from ADLs, IADLs, and leisure.   CO-MORBIDITIES: has co-morbidities such as   that affects occupational performance. Patient will benefit from skilled OT to address above impairments and improve overall function.  MODIFICATION OR ASSISTANCE TO COMPLETE EVALUATION: Min-Moderate modification of tasks or assist with assess necessary to complete an evaluation.  OT OCCUPATIONAL PROFILE AND HISTORY: Detailed assessment: Review of records and additional review of physical, cognitive, psychosocial history related to current functional performance.  CLINICAL DECISION MAKING: Moderate - several treatment options, min-mod task modification necessary  REHAB POTENTIAL: Good  EVALUATION COMPLEXITY: Moderate    PLAN:  OT FREQUENCY: 2x/week  OT DURATION: 12 weeks  PLANNED INTERVENTIONS: 97168 OT Re-evaluation, 97535 self care/ADL training, 16109 therapeutic exercise, 97530 therapeutic activity, 97112 neuromuscular re-education, 97140  manual therapy, 97010 moist heat, psychosocial skills training, energy conservation, coping strategies training, patient/family education, and DME and/or AE instructions  RECOMMENDED OTHER SERVICES: None at this time (pt currently working with PT)  CONSULTED AND AGREED WITH PLAN OF CARE: Patient and family member/caregiver  PLAN FOR NEXT SESSION: See above  Marcus Sewer, MS, OTR/L  Casandra Claw, OT 11/16/2023, 2:09  PM

## 2023-11-17 ENCOUNTER — Ambulatory Visit (INDEPENDENT_AMBULATORY_CARE_PROVIDER_SITE_OTHER): Payer: Medicare Other | Admitting: Neurology

## 2023-11-17 ENCOUNTER — Encounter: Payer: Self-pay | Admitting: Neurology

## 2023-11-17 VITALS — BP 146/60 | HR 62 | Wt 159.6 lb

## 2023-11-17 DIAGNOSIS — G4701 Insomnia due to medical condition: Secondary | ICD-10-CM | POA: Diagnosis not present

## 2023-11-17 DIAGNOSIS — G4752 REM sleep behavior disorder: Secondary | ICD-10-CM

## 2023-11-17 DIAGNOSIS — G20A1 Parkinson's disease without dyskinesia, without mention of fluctuations: Secondary | ICD-10-CM | POA: Diagnosis not present

## 2023-11-17 MED ORDER — CARBIDOPA-LEVODOPA 25-100 MG PO TABS: ORAL_TABLET | ORAL | 1 refills | Status: DC

## 2023-11-17 MED ORDER — CARBIDOPA-LEVODOPA ER 50-200 MG PO TBCR: 1.0000 | EXTENDED_RELEASE_TABLET | Freq: Every day | ORAL | 1 refills | Status: DC

## 2023-11-17 NOTE — Patient Instructions (Signed)
 SAVE THE DATE!  We are planning a Parkinsons Disease educational symposium at [facility] in [place] on September 19.  More details to come!  We will have a movement disorder physician expert from [employer] coming to speak and a caregiver speaker.  We will have a panel of experts that will show you who you may need on your team of people on your journey with Parkinsons.  If you would like to be added to our email list to get further information, email [email or web address].  I hope to see you there!

## 2023-11-19 ENCOUNTER — Ambulatory Visit

## 2023-11-19 ENCOUNTER — Ambulatory Visit: Payer: Medicare Other

## 2023-11-19 DIAGNOSIS — M6281 Muscle weakness (generalized): Secondary | ICD-10-CM

## 2023-11-19 DIAGNOSIS — R2681 Unsteadiness on feet: Secondary | ICD-10-CM | POA: Diagnosis not present

## 2023-11-19 DIAGNOSIS — R278 Other lack of coordination: Secondary | ICD-10-CM | POA: Diagnosis not present

## 2023-11-19 DIAGNOSIS — R269 Unspecified abnormalities of gait and mobility: Secondary | ICD-10-CM | POA: Diagnosis not present

## 2023-11-19 DIAGNOSIS — R262 Difficulty in walking, not elsewhere classified: Secondary | ICD-10-CM

## 2023-11-19 DIAGNOSIS — R29898 Other symptoms and signs involving the musculoskeletal system: Secondary | ICD-10-CM

## 2023-11-19 DIAGNOSIS — R2689 Other abnormalities of gait and mobility: Secondary | ICD-10-CM

## 2023-11-19 DIAGNOSIS — M5459 Other low back pain: Secondary | ICD-10-CM

## 2023-11-19 NOTE — Therapy (Signed)
 OUTPATIENT PHYSICAL THERAPY NEURO TREATMENT   Patient Name: Johnathan Arnold. MRN: [id number] DOB:04/16/1946, 78 y.o., male Today's Date: 11/19/2023  PCP: Thersia Flax, MD  REFERRING PROVIDER: Thersia Flax, MD   END OF SESSION:  PT End of Session - 11/19/23 1100     Visit Number 24    Number of Visits 40    Date for PT Re-Evaluation 01/07/24    PT Start Time 1101    PT Stop Time 1145    PT Time Calculation (min) 44 min    Equipment Utilized During Treatment Gait belt    Activity Tolerance Patient tolerated treatment well    Behavior During Therapy [facility] for tasks assessed/performed                      Past Medical History:  Diagnosis Date   3-vessel coronary artery disease    s/p  5 vessel CABG   Allergy June 2024   Diabetes mellitus without complication (HCC)    History of cardiac catheterization 2011   [facility]   Hyperlipidemia    Hypertension    Hypertriglyceridemia    Neuromuscular disorder [facility]) October 2017   Diagonis Parkinson   Parkinson's disease [facility])    Pneumonia 12/28/2020   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 LIMA to LAD, SVG to D1, OM1 and RPDA   CORONARY ARTERY BYPASS GRAFT  03/1998   5 vessel, [facility]   RIGHT HEART CATH N/A 04/04/2019   Procedure: RIGHT HEART CATH;  Surgeon: Wenona Hamilton, MD;  Location: [facility] INVASIVE CV LAB;  Service: Cardiovascular;  Laterality: N/A;   RIGHT/LEFT HEART CATH AND CORONARY ANGIOGRAPHY N/A 02/01/2018   Procedure: RIGHT/LEFT HEART CATH AND CORONARY ANGIOGRAPHY;  Surgeon: Wenona Hamilton, MD;  Location: [facility] INVASIVE CV LAB;  Service: Cardiovascular;  Laterality: N/A;   Patient Active Problem List   Diagnosis Date Noted   Diverticulitis 07/26/2023   RSV (respiratory syncytial virus pneumonia) 07/26/2023   Impaired ambulation 07/02/2023   Orthostatic hypotension 05/22/2023    Generalized weakness 05/07/2021   Bradycardia    Anemia, unspecified 02/07/2021   Neutropenia (HCC) 01/29/2021   Thrombocytopenia (HCC) 01/29/2021   Right lower lobe pneumonia 12/28/2020   Hyponatremia 12/22/2020   Bilateral leg weakness 12/20/2020   Prostate cancer screening 09/08/2020   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) 09/06/2020   Low back pain 08/19/2019   Pulmonary hypertension (HCC)    Insomnia 12/21/2018   Sleep apnea in adult 12/09/2018   Periodic limb movement disorder 12/09/2018   Pulmonary nodules 05/18/2018   Wears hearing aid in both ears 05/17/2018   Leg pain, bilateral 02/20/2018   Dyspnea    CKD stage 3a, GFR 45-59 ml/min (HCC) 09/21/2017   History of skin cancer in adulthood 08/14/2016   Parkinson's disease (HCC) 08/09/2016   Bilateral carotid artery stenosis 04/02/2015   Vertigo, peripheral 10/17/2014   Benign prostatic hyperplasia with urinary frequency 01/31/2014   Obesity 04/03/2013   Other malaise and fatigue 09/21/2012   Hyperlipidemia    Essential hypertension    3-vessel coronary artery disease    S/P CABG x 5     ONSET DATE: 07/03/23  REFERRING DIAG: N82.956 (ICD-10-CM) - Weakness of both lower extremities   THERAPY DIAG:  Muscle weakness (generalized)  Unsteadiness on feet  Abnormality of gait and  mobility  Other abnormalities of gait and mobility  Difficulty in walking, not elsewhere classified  Other low back pain  Weakness of both hips  Rationale for Evaluation and Treatment: Rehabilitation  SUBJECTIVE:                                                                                                                                                                                             SUBJECTIVE STATEMENT:  Pt states he is okay today. In no pain and no reports of falls this week. Pt states he is going to the beach for 2 weeks on 6/20.   Pt accompanied by: significant other  PERTINENT HISTORY:   From prior PT evaluation:  PD, history of COVID and significant weakness present following this diagnosis and the use of paxlovid ( August 5 dx date, thinks Paxlovid may have exacerbated PD symptoms)    Pt reports having COVID and having significant weakness.  Patient reports he also has back pain that is not present for a long time but was exacerbated more recently.  Patient previously did physical therapy for his back and experienced some relief but has not been consistent with the exercises.  Patient also has history of going to Parkinson's rock steady classes multiple times per week prior to onset of his COVID but he has not been back since. Patient previously ambulated without an assistive device but is now ambulating with a straight point cane.  Patient reports increased foot and ankle weakness in comparison with his hips and knees.  Patient also reports low back pain that is exacerbated with prolonged standing.  PAIN:  Are you having pain? No  PRECAUTIONS: Fall  RED FLAGS: None   WEIGHT BEARING RESTRICTIONS: No  FALLS: Has patient fallen in last 6 months? Yes. Number of falls 6  LIVING ENVIRONMENT: Lives with: lives with their spouse Lives in: House/apartment Stairs: Yes: Internal: 15 steps; on left going up and External: 1 steps; none Has following equipment at home: Quad cane large base and Walker - 2 wheeled  PLOF: Independent  PATIENT GOALS: to improve overall strength, get back to walking more regularly  OBJECTIVE:  Note: Objective measures were completed at Evaluation unless otherwise noted.  DIAGNOSTIC FINDINGS:   EXAM: CT HEAD WITHOUT CONTRAST  IMPRESSION: 1. No acute intracranial process. 2. Mild chronic small vessel ischemic changes. 3. Acute right frontal and maxillary sinusitis.  COGNITION: Overall cognitive status: Within functional limits for tasks assessed   SENSATION: WFL  COORDINATION: WFL    LOWER EXTREMITY ROM:     Active  Right Eval  Left Eval  Hip flexion  Hip extension    Hip abduction    Hip adduction    Hip internal rotation    Hip external rotation    Knee flexion    Knee extension    Ankle dorsiflexion    Ankle plantarflexion    Ankle inversion    Ankle eversion     (Blank rows = not tested)  LOWER EXTREMITY MMT:    MMT Right Eval Left Eval  Hip flexion 4 4  Hip abduction 4 4  Hip adduction 4 4  Knee flexion 3+ 3+  Knee extension 4- 4-  Ankle dorsiflexion 4+ 4  (Blank rows = not tested)  BED MOBILITY:  No limitations according to the pt.  TRANSFERS: Assistive device utilized: None  Sit to stand: Complete Independence Stand to sit: Complete Independence Chair to chair: Complete Independence Floor: Not tested  FUNCTIONAL TESTS:  5 times sit to stand: 15.03 sec Timed up and go (TUG): 13.38 sec 6 minute walk test: TBD 10 meter walk test: 12.15 sec; 0.82 m/s Dynamic Gait Index: TBD  PATIENT SURVEYS:  ABC scale 70.6%                                                                                                                              TREATMENT DATE: 11/19/23  NMR Octane leg press x 6 min with rest at level 3. Cues for BLE only in leg press mode.   TA Seated PWR up with GTB resistance 2 x 10 STS PWR rock with 3# dumbbell 2x10 BUE continuing plan of care  Seated PWR step x 15, x 15 RTB  Leg press 3 x 10 70-75#  NMR Kore balance trainer working on patients proprioception and standing balance on dynamic surface  Setting type(s): SDL ball 3 reps ;  Comments: required 2 games to warm up, best performance came on 3rd rep, improved coordination and timing. Displayed more control of lateral weight shifting after 2 reps.    PATIENT EDUCATION: Education details: Pt educated throughout session about proper posture and technique with exercises. Improved exercise technique, movement at target joints, use of target muscles after min to mod verbal, visual, tactile cues  Person  educated: Patient and Spouse Education method: Explanation, Demonstration, and Verbal cues Education comprehension: verbalized understanding  HOME EXERCISE PROGRAM: Seated PWR! Moves x 10 ea, handout provided   GOALS: Goals reviewed with patient? Yes  SHORT TERM GOALS: Target date: 09/14/2023    Pt will be independent with HEP in order to demonstrate increased ability to perform tasks related to occupation/hobbies. Baseline:  Pt to be given HEP at next visit. Goal status: INITIAL  LONG TERM GOALS: Target date: 11/09/2023  1.  Patient (> 58 years old) will complete five times sit to stand test in < 15 seconds indicating an increased LE strength and improved balance. Baseline: 15.03 sec 4/14: 11.79 sec  5/29: 11.06 sec Goal status: MET  2.  Patient will increase their ABC  scale score to be 80.6% (an increase of 10 points), demonstrating improved balance confidence and reduced fall risk, as measured through weekly assessments. Baseline: 70.6% 4/14:72 % 5/29: 74% Goal status: INITIAL   3.  Patient will increase Berg Balance score by > 6 points to demonstrate decreased fall risk during functional activities. Baseline: 43 on 3/13 4/14: 49 5/29: 56 Goal status: MET   4.  Patient will reduce timed up and go to <11 seconds to reduce fall risk and demonstrate improved transfer/gait ability. Baseline: 13.38 sec 4/14: 11.4 sec no AD 5/29: 10.78 sec Goal status: MET  5.  Patient will increase 10 meter walk test to >1.66m/s as to improve gait speed for better community ambulation and to reduce fall risk. Baseline: 12.15 sec; 0.82 m/s 5/29: 1.06 m/s Goal status: MET  6.  Patient will increase six minute walk test distance to >1000 for progression to community ambulator and improve gait ability Baseline: 459 ft with RW stopping at 5:30 due to fatigue  09/21/23:1128 ft with 4WW  Goal status: MET  7.  Patient will increased Minibest by at least 3 points  to reduce fall risk and demonstrate  improved transfer/gait ability. Baseline: 11/09/23: 22 Goal status: initial.   8.  Patient will be independent with gym-based exercise program that he can complete 2-3 times weekly to build his lower extremity strength and postural strength Baseline: no gym program, wants to continue to progress leg strength  Goal status: Initial    ASSESSMENT:  CLINICAL IMPRESSION:  Patient arrived with good motivation for completion of pt activities. Continued treatment as initiated in prior session. Pt showed good carryover of PWR moves initiated in prior session. Dumbell added to PWR rock to promote UE strengthening to aid in UE function. TB added to PWR step to encourage more activation of hip abductors which can decrease the risk of falls. Pt continues to perform well on KORE balance machine and shows improved reactions throughout. Pt does well with postural strength interventions.  Pt will continue to benefit from skilled physical therapy intervention to address impairments, improve QOL, and attain therapy goals.     OBJECTIVE IMPAIRMENTS: Abnormal gait, decreased activity tolerance, decreased balance, decreased endurance, decreased knowledge of use of DME, decreased mobility, difficulty walking, decreased ROM, decreased strength, and decreased safety awareness.   ACTIVITY LIMITATIONS: carrying, lifting, bending, standing, squatting, bathing, and locomotion level  PARTICIPATION LIMITATIONS: cleaning, laundry, driving, shopping, community activity, and yard work  PERSONAL FACTORS: Age, Education, Past/current experiences, Time since onset of injury/illness/exacerbation, and 3+ comorbidities: orthostatic HTN, syncope, falls, anemia, insomnia, dyspnea are also affecting patient's functional outcome.   REHAB POTENTIAL: Fair pt has been seen in the past.  CLINICAL DECISION MAKING: Evolving/moderate complexity  EVALUATION COMPLEXITY: Moderate  PLAN:  PT FREQUENCY: 2x/week  PT DURATION: 8  weeks  PLANNED INTERVENTIONS: 97110-Therapeutic exercises, 97530- Therapeutic activity, 97112- Neuromuscular re-education, 97535- Self Care, 16109- Manual therapy, [phone]- Gait training, Balance training, Stair training, and Vestibular training  PLAN FOR NEXT SESSION:  Continue  well zone training as appropriate   Balance and strength interventions PD specific interventions as indicated   Barba Levin, SPT    Milton M. Fairly IV, PT, DPT Physical Therapist- [facility] 11/19/23, 12:52 PM

## 2023-11-20 NOTE — Therapy (Signed)
 OUTPATIENT OCCUPATIONAL THERAPY NEURO TREATMENT NOTE  Patient Name: Johnathan Arnold. MRN: [id number] DOB:August 28, 1945, 78 y.o., male Today's Date: 11/20/2023  PCP: Dr. Creta Dolin REFERRING PROVIDER: Dr. Creta Dolin  END OF SESSION:  OT End of Session - 11/20/23 2050     Visit Number 5    Number of Visits 24    Date for OT Re-Evaluation 01/28/24    OT Start Time 1100    OT Stop Time 1145    OT Time Calculation (min) 45 min    Activity Tolerance Patient tolerated treatment well    Behavior During Therapy [facility] for tasks assessed/performed         Past Medical History:  Diagnosis Date   3-vessel coronary artery disease    s/p  5 vessel CABG   Allergy June 2024   Diabetes mellitus without complication (HCC)    History of cardiac catheterization 2011   [facility]   Hyperlipidemia    Hypertension    Hypertriglyceridemia    Neuromuscular disorder [facility]) October 2017   Diagonis Parkinson   Parkinson's disease [facility])    Pneumonia 12/28/2020   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 LIMA to LAD, SVG to D1, OM1 and RPDA   CORONARY ARTERY BYPASS GRAFT  03/1998   5 vessel, [facility]   RIGHT HEART CATH N/A 04/04/2019   Procedure: RIGHT HEART CATH;  Surgeon: Wenona Hamilton, MD;  Location: [facility] INVASIVE CV LAB;  Service: Cardiovascular;  Laterality: N/A;   RIGHT/LEFT HEART CATH AND CORONARY ANGIOGRAPHY N/A 02/01/2018   Procedure: RIGHT/LEFT HEART CATH AND CORONARY ANGIOGRAPHY;  Surgeon: Wenona Hamilton, MD;  Location: [facility] INVASIVE CV LAB;  Service: Cardiovascular;  Laterality: N/A;   Patient Active Problem List   Diagnosis Date Noted   Diverticulitis 07/26/2023   RSV (respiratory syncytial virus pneumonia) 07/26/2023   Impaired ambulation 07/02/2023   Orthostatic hypotension 05/22/2023   Generalized weakness 05/07/2021   Bradycardia    Anemia, unspecified 02/07/2021    Neutropenia (HCC) 01/29/2021   Thrombocytopenia (HCC) 01/29/2021   Right lower lobe pneumonia 12/28/2020   Hyponatremia 12/22/2020   Bilateral leg weakness 12/20/2020   Prostate cancer screening 09/08/2020   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) 09/06/2020   Low back pain 08/19/2019   Pulmonary hypertension (HCC)    Insomnia 12/21/2018   Sleep apnea in adult 12/09/2018   Periodic limb movement disorder 12/09/2018   Pulmonary nodules 05/18/2018   Wears hearing aid in both ears 05/17/2018   Leg pain, bilateral 02/20/2018   Dyspnea    CKD stage 3a, GFR 45-59 ml/min (HCC) 09/21/2017   History of skin cancer in adulthood 08/14/2016   Parkinson's disease (HCC) 08/09/2016   Bilateral carotid artery stenosis 04/02/2015   Vertigo, peripheral 10/17/2014   Benign prostatic hyperplasia with urinary frequency 01/31/2014   Obesity 04/03/2013   Other malaise and fatigue 09/21/2012   Hyperlipidemia    Essential hypertension    3-vessel coronary artery disease    S/P CABG x 5    ONSET DATE: 07/03/23 (hospitalization); diagnosed with PD 2017  REFERRING DIAG: Parkinson's Disease  THERAPY DIAG:  Muscle weakness (generalized)  Other lack of coordination  Rationale for Evaluation and Treatment: Rehabilitation  SUBJECTIVE:  SUBJECTIVE STATEMENT: Pt reports doing well today.  Pt brought along a button up shirt for practice as requested by OT last visit.  Pt accompanied by: self  PERTINENT HISTORY: Per chart on 08/07/23: [name] is a 78 y.o. male with medical history significant of CAD status post CABG, type 2 diabetes, Parkinson disease, hypertension presenting with weakness.     Patient noted to have been admitted 07/26/23, 07/02/23 for weakness and were treated for issues including right lower lobe pneumonia, RSV, diverticulitis.  Per the son, patient has had progressively worsening weakness and fatigue at home.  Has grown more lethargic over the past 1  to 2 days PTA with patient not getting out of bed.   Pt also reports xx of L3 fx and pelvic fx.  PRECAUTIONS: Fall  WEIGHT BEARING RESTRICTIONS: No  PAIN: No pain at present  Are you having pain? Yes: NPRS scale: 0/10, can reach 6-7/10 pain Pain location: low back Pain description: achy Aggravating factors: prolonged standing Relieving factors: tylenol /rest   FALLS: Has patient fallen in last 6 months? Yes. Number of falls several falls   LIVING ENVIRONMENT: Lives with: lives with their spouse Lives in: House Stairs: 15 steps, 1 rail on the left going up, 1 external step without rails Has following equipment at home: shower chair, RW, comfort height toilet, large base quad cane   PLOF: Independent, handwriting legible for check writing and addressing envelopes prior to this Winter  PATIENT GOALS: I hope to improve my writing or printing.   OBJECTIVE:  Note: Objective measures were completed at Evaluation unless otherwise noted.  HAND DOMINANCE: Right  ADLs: Overall ADLs: Pt reports decline in ADLs since this Winter after multiple hospitalizations Transfers/ambulation related to ADLs: intermittent use of a RW if back pain is present, mobility is slower without walker  Eating: tremors present when manipulation eating utensils (worse on the R but still does eat with the R); messy, food drops (ok with stabbing food but not balancing food on fork).  Spills frequently when drinking from a glass Grooming: able to shave with a blade but reports this to be slightly challenging  UB Dressing: difficulty with buttoning shirts; spouse buttons most of his shirts  LB Dressing: challenging to button and zip up slacks, able to tie shoes without difficulty Toileting: indep  Bathing: distant supv for a shower  Tub Shower transfers: distant supv using walk in shower with shower chair Equipment: see above  IADLs:             Shopping: Spouse manages as pt gets tired walking  Light  housekeeping: spouse manages most of it; pt reports he can mop the kitchen floor  Meal Prep: pt can manage light meal prep; must use both hands to pour a drink pitcher and carries items in the L hand Community mobility: present today without AD; Pt reports that spouse mostly drives, but he is able Medication management: Pt reports being indep with pill set up with weekly pill organizer for pt and spouse's meds; very rarely reports dropping pills; pt reports he can be forgetful to take pills on occasion, and takes PD meds every 3 hours. Financial management: Pt manages but reports recent decline with legibility when writing checks.  Pt reports he writes 15-20 checks a month  and wrote a check 1-2 weeks ago that was illegible.  Pt states that he had to type out the address and return address on the envelope. Handwriting: Printed and signed name with good legibility today, but with mild micrographia  MOBILITY STATUS: See PT eval  ACTIVITY TOLERANCE:  Activity tolerance: WFL for tasks assessed; to  be further assessed in functional contexts Pt reports he has participated in [employer] 3 days a week since 2017  FUNCTIONAL OUTCOME MEASURES: TBD  UPPER EXTREMITY ROM:  WFL   UPPER EXTREMITY MMT:     MMT Right eval Left eval  Shoulder flexion 4+ 4+  Shoulder abduction 4+ 4+  Shoulder adduction    Shoulder extension    Shoulder internal rotation    Shoulder external rotation    Middle trapezius    Lower trapezius    Elbow flexion 5 5  Elbow extension 5 5  Wrist flexion 5 5  Wrist extension 5 5  Wrist ulnar deviation    Wrist radial deviation    Wrist pronation    Wrist supination    (Blank rows = not tested)  HAND FUNCTION: Grip strength: Right: 65 lbs; Left: 65 lbs, Lateral pinch: Right: 14 lbs, Left: 13 lbs, and 3 point pinch: Right: 12 lbs, Left: 10 lbs  COORDINATION: 9 Hole Peg test: Right: 31 sec; Left: 30 sec  SENSATION: BUEs WFL  EDEMA: No visible edema  MUSCLE  TONE: No noted rigidity or increased tone in BUEs  COGNITION: Overall cognitive status: WFL for tasks assessed, but pt does endorse some decreased memory  VISION: Cataracts removed both eyes a couple years ago; wears reading glasses   OBSERVATIONS:  Mild resting and action tremors in BUEs, R>L                                                                                              TREATMENT DATE: 11/19/23  Self Care: -Education/demonstration/practice trials with and without button hook for buttons on dress shirt collar buttons, front buttons, cuff buttons, pants buttons. -Education/demonstration/practice trials with self feeding AE for improving control/reducing spills with food and drink  -trials with weighted spoon vs standard, swivel spork, built up fork, rocker knife, scoop plate, divided plate  -Recommendation to identify from home or obtain a heavier glass or cup with lid/straw/handle for reducing spills -Review of positioning strategies to maximize distal BUE stability: elbows/forearms on table top, seat pushed up close to table  PATIENT EDUCATION: Education details: ADL compensation strategies/AE/options to obtain AE Person educated: pt/spouse Education method: explanation/vc/demo Education comprehension: verbalized understanding, demonstrated understanding, further training needed  HOME EXERCISE PROGRAM: -green theraputty -handwriting  GOALS: Goals reviewed with patient?   SHORT TERM GOALS: Target date: 12/17/23  Pt will be indep with HEP with use of visual handout for improving/maintaining bilat hand strength/coordination for daily tasks. Baseline: Eval: HEP not yet initiated. Goal status: INITIAL  2.  Pt will verbalize and implement 1-2 compensatory strategies to improve handwriting legibility for writing checks with min vc or less. Baseline: Eval: Further training needed; mild micrographia and legibility is inconsistent  Goal status: INITIAL  LONG TERM GOALS:  Target date: 01/28/24  Pt will write 3 consecutive checks for bill paying with 100% legibility.  Baseline: Eval: Pt reports that he wrote a check 1-2 weeks ago that was illegible Goal status: INITIAL  2.  Pt will be indep to manage shirt and pants buttons and zippers with modified indep using  AE as needed at least 75% of the time. Baseline: Eval: Pt reports difficulty with shirt and pants buttons and zippers and reports spouse buttons most of his shirts. Goal status: INITIAL  3.  Pt will report consistent medication adherence at least 5 of 7 days a week using a phone alarm or other external reminder. Baseline: Eval: Pt endorses memory deficits and needing to set up his phone alarm for med management, but states he got frustrated with an attempt at setting this up on his phone. Goal status: INITIAL   4.  Pt will verbalize and implement 1-2 compensatory strategies with min vc or less to reduce spilling and dropped food during meal times.    Baseline: Eval: Pt endorses messy eating d/t tremors/food dropping from utensils and drink spills   Goal status: INITIAL  ASSESSMENT:  CLINICAL IMPRESSION: Pt found button hook to be effective for managing pants button and smaller collar and shirt cuff buttons.  OT issued button hook and encouraged pt continue to practice with a variety of button up clothing at home to ease efficiency with buttons.  Pt also demonstrated improved control of spoon when using weighted spoon vs standard.  OT advised on options to obtain.  Pt/spouse receptive to all compensation strategies and positioning education.  Will continue to follow up on practice trials in home when using these strategies and or with any AE obtained.  Pt will continue to benefit from skilled OT for continued focus on handwriting skills, instruction in ADL compensation/AE, and HEP review in order to maximize indep with ADL/IADL tasks while adapting to physical/cognitive/functional changes with a  neurodegenerative disease.   PERFORMANCE DEFICITS: in functional skills including ADLs, IADLs, coordination, dexterity, strength, pain, Fine motor control, Gross motor control, mobility, endurance, decreased knowledge of use of DME, and UE functional use, cognitive skills including emotional, memory, problem solving, and thought, and psychosocial skills including coping strategies, environmental adaptation, habits, and routines and behaviors.   IMPAIRMENTS: are limiting patient from ADLs, IADLs, and leisure.   CO-MORBIDITIES: has co-morbidities such as   that affects occupational performance. Patient will benefit from skilled OT to address above impairments and improve overall function.  MODIFICATION OR ASSISTANCE TO COMPLETE EVALUATION: Min-Moderate modification of tasks or assist with assess necessary to complete an evaluation.  OT OCCUPATIONAL PROFILE AND HISTORY: Detailed assessment: Review of records and additional review of physical, cognitive, psychosocial history related to current functional performance.  CLINICAL DECISION MAKING: Moderate - several treatment options, min-mod task modification necessary  REHAB POTENTIAL: Good  EVALUATION COMPLEXITY: Moderate    PLAN:  OT FREQUENCY: 2x/week  OT DURATION: 12 weeks  PLANNED INTERVENTIONS: 97168 OT Re-evaluation, 97535 self care/ADL training, 16109 therapeutic exercise, 97530 therapeutic activity, 97112 neuromuscular re-education, 97140 manual therapy, 97010 moist heat, psychosocial skills training, energy conservation, coping strategies training, patient/family education, and DME and/or AE instructions  RECOMMENDED OTHER SERVICES: None at this time (pt currently working with PT)  CONSULTED AND AGREED WITH PLAN OF CARE: Patient and family member/caregiver  PLAN FOR NEXT SESSION: See above  Marcus Sewer, MS, OTR/L  Casandra Claw, OT 11/20/2023, 8:51 PM

## 2023-11-23 ENCOUNTER — Ambulatory Visit: Payer: Medicare Other | Admitting: Physical Therapy

## 2023-11-23 ENCOUNTER — Ambulatory Visit

## 2023-11-24 ENCOUNTER — Ambulatory Visit (INDEPENDENT_AMBULATORY_CARE_PROVIDER_SITE_OTHER): Admitting: *Deleted

## 2023-11-24 VITALS — Ht 66.0 in | Wt 153.0 lb

## 2023-11-24 DIAGNOSIS — Z0000 Encounter for general adult medical examination without abnormal findings: Secondary | ICD-10-CM | POA: Diagnosis not present

## 2023-11-24 NOTE — Progress Notes (Signed)
 Subjective:   Johnathan Dartt. is a 78 y.o. who presents for a Medicare Wellness preventive visit.  As a reminder, Annual Wellness Visits don't include a physical exam, and some assessments may be limited, especially if this visit is performed virtually. We may recommend an in-person follow-up visit with your provider if needed.  Visit Complete: Virtual I connected with  Johnathan Arnold. on 11/24/23 by a audio enabled telemedicine application and verified that I am speaking with the correct person using two identifiers.  Patient Location: Home  Provider Location: Home Office  I discussed the limitations of evaluation and management by telemedicine. The patient expressed understanding and agreed to proceed.  Vital Signs: Because this visit was a virtual/telehealth visit, some criteria may be missing or patient reported. Any vitals not documented were not able to be obtained and vitals that have been documented are patient reported.  VideoDeclined- This patient declined Librarian, academic. Therefore the visit was completed with audio only.  Persons Participating in Visit: Patient.  AWV Questionnaire: No: Patient Medicare AWV questionnaire was not completed prior to this visit.  Cardiac Risk Factors include: advanced age (>31men, >3 women);male gender;dyslipidemia;hypertension;Other (see comment), Risk factor comments: CABG X 5     Objective:    Today's Vitals   11/24/23 1305  Weight: 153 lb (69.4 kg)  Height: 5' 6 (1.676 m)   Body mass index is 24.69 kg/m.     11/24/2023    1:21 PM 11/17/2023    3:02 PM 09/15/2023   11:07 AM 08/04/2023    8:00 PM 07/26/2023    8:40 AM 07/02/2023   11:14 PM 07/02/2023   10:42 AM  Advanced Directives  Does Patient Have a Medical Advance Directive? Yes Yes Yes No Unable to assess, patient is non-responsive or altered mental status Yes Yes  Type of Public librarian Power of [place];Living will  Living will Living will   Healthcare Power of [employer] Power of [place];Living will  Does patient want to make changes to medical advance directive?      No - Patient declined   Copy of Healthcare Power of Attorney in Chart? No - copy requested     No - copy requested     Current Medications (verified) Outpatient Encounter Medications as of 11/24/2023  Medication Sig   acetaminophen  (TYLENOL ) 500 MG tablet Take 500 mg by mouth as needed.   aspirin  EC 81 MG tablet Take 1 tablet (81 mg total) by mouth at bedtime.   carbidopa -levodopa  (SINEMET  CR) 50-200 MG tablet Take 1 tablet by mouth at bedtime.   carbidopa -levodopa  (SINEMET  IR) 25-100 MG tablet 2 AT 8:00AM , 2 AT 11:00AM, 2 AT 2:00PM AND 1 AT 5:00PM   furosemide  (LASIX ) 20 MG tablet Take 1 tablet (20 mg total) by mouth daily as needed (for swelling).   hydrocortisone  cream 1 % Apply topically 2 (two) times daily.   isosorbide  mononitrate (IMDUR ) 60 MG 24 hr tablet TAKE 1 TABLET BY MOUTH DAILY   losartan  (COZAAR ) 50 MG tablet Take 1 tablet (50 mg total) by mouth in the morning and at bedtime.   melatonin 3 MG TABS tablet Take 1.5 mg by mouth at bedtime as needed.   Multiple Vitamin (MULTIVITAMIN) tablet Take 1 tablet by mouth daily.   nitroGLYCERIN  (NITROSTAT ) 0.4 MG SL tablet Place 1 tablet (0.4 mg total) under the tongue every 5 (five) minutes as needed for chest pain.   omeprazole  (PRILOSEC) 20 MG capsule Take  1 capsule (20 mg total) by mouth every morning.   ondansetron  (ZOFRAN ) 4 MG tablet Take 1 tablet (4 mg total) by mouth every 6 (six) hours as needed for nausea or vomiting.   polyethylene glycol (MIRALAX  / GLYCOLAX ) packet Take 17 g by mouth daily as needed.   rosuvastatin  (CRESTOR ) 10 MG tablet TAKE 1 TABLET BY MOUTH DAILY   tamsulosin  (FLOMAX ) 0.4 MG CAPS capsule Take 1 capsule (0.4 mg total) by mouth daily.   traZODone  (DESYREL ) 50 MG tablet Take 0.5-1 tablets (25-50 mg total) by mouth at bedtime as needed for sleep.    No facility-administered encounter medications on file as of 11/24/2023.    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 LIMA to LAD, SVG to D1, OM1 and RPDA   CORONARY ARTERY BYPASS GRAFT  03/1998   5 vessel, [facility]   RIGHT HEART CATH N/A 04/04/2019   Procedure: RIGHT HEART CATH;  Surgeon: Wenona Hamilton, MD;  Location: [facility] INVASIVE CV LAB;  Service: Cardiovascular;  Laterality: N/A;   RIGHT/LEFT HEART CATH AND CORONARY ANGIOGRAPHY N/A 02/01/2018   Procedure: RIGHT/LEFT HEART CATH AND CORONARY ANGIOGRAPHY;  Surgeon: Wenona Hamilton, MD;  Location: [facility] INVASIVE CV LAB;  Service: Cardiovascular;  Laterality: N/A;   Family History  Problem Relation Age of Onset   Heart attack Mother 53   Hypertension Mother    Heart disease Mother    Heart attack Father 70   Heart disease Father    Heart disease Brother    Healthy Son    Heart disease Brother    Social History   Socioeconomic History   Marital status: Married    Spouse name: Not on file   Number of children: 2   Years of education: Not on file   Highest education level: Master's degree (e.g., MA, MS, MEng, MEd, MSW, MBA)  Occupational History   Occupation: retired    Comment: IT work  Tobacco Use   Smoking status: Never    Passive exposure: Yes   Smokeless tobacco: Never  Vaping Use   Vaping status: Never Used  Substance and Sexual Activity    Alcohol use: Yes    Comment: occasional beer   Drug use: No   Sexual activity: Not Currently  Other Topics Concern   Not on file  Social History Narrative   Right Handed    Lives in a two story home    Social Drivers of Health   Financial Resource Strain: Low Risk  (11/24/2023)   Overall Financial Resource Strain (CARDIA)    Difficulty of Paying Living Expenses: Not hard at all  Food Insecurity: No Food Insecurity (11/24/2023)   Hunger Vital Sign    Worried About Running Out of Food in the Last Year: Never true    Ran Out of Food in the Last Year: Never true  Transportation Needs: No  Transportation Needs (11/24/2023)   PRAPARE - Administrator, Civil Service (Medical): No    Lack of Transportation (Non-Medical): No  Physical Activity: Sufficiently Active (11/24/2023)   Exercise Vital Sign    Days of Exercise per Week: 3 days    Minutes of Exercise per Session: 60 min  Stress: Stress Concern Present (11/24/2023)   [employer] of Occupational Health - Occupational Stress Questionnaire    Feeling of Stress: To some extent  Social Connections: Socially Integrated (11/24/2023)   Social Connection and Isolation Panel    Frequency of Communication with Friends and Family: More than three times a week    Frequency of Social Gatherings with Friends and Family: Twice a week    Attends Religious Services: More than 4 times per year    Active Member of [employer] or Organizations: Yes    Attends Engineer, structural: More than 4 times per year    Marital Status: Married    Tobacco Counseling Counseling given: Not Answered    Clinical Intake:  Pre-visit preparation completed: Yes  Pain : No/denies pain     BMI - recorded: 24.69 Nutritional Status: BMI of 19-24  Normal Nutritional Risks: None Diabetes: No  Lab Results  Component Value Date   HGBA1C 5.9 09/17/2022   HGBA1C 5.7 03/18/2022   HGBA1C 5.4 05/07/2021     How often do you need to have someone  help you when you read instructions, pamphlets, or other written materials from your doctor or pharmacy?: 1 - Never  Interpreter Needed?: No  Information entered by :: R. Landen Knoedler LPN   Activities of Daily Living     11/24/2023    1:07 PM 07/27/2023    5:58 PM  In your present state of health, do you have any difficulty performing the following activities:  Hearing? 1   Comment wears aids   Vision? 0   Comment glasses   Difficulty concentrating or making decisions? 1   Walking or climbing stairs? 1   Dressing or bathing? 0   Doing errands, shopping? 1 0  Preparing Food and eating ? N   Using the Toilet? N   In the past six months, have you accidently leaked urine? N   Do you have problems with loss of bowel control? N   Managing your Medications? N   Managing your Finances? N   Housekeeping or managing your Housekeeping? N     Patient Care Team: Thersia Flax, MD as PCP - General (Internal Medicine) Wenona Hamilton, MD as PCP - Cardiology (Cardiology) Percival Brace, MD (Internal Medicine) Tat, Von Grumbling, DO as Consulting Physician (Neurology)  I have updated your Care Teams any recent Medical Services you may have received from other providers in the past year.     Assessment:   This is a routine wellness examination for Keymarion.  Hearing/Vision screen Hearing Screening - Comments:: Wears aids Vision Screening - Comments:: glasses   Goals Addressed             This Visit's Progress    Patient Stated       Wants to postpone Parkinsons  as long as he can        Depression Screen     11/24/2023    1:15 PM 09/01/2023    3:20 PM 01/20/2023    9:15 AM 09/17/2022    9:07 AM 03/18/2022   10:39 AM 12/13/2021    1:35 PM 05/16/2021   11:05 AM  PHQ 2/9 Scores  PHQ - 2 Score 1 1 0 0 0 0 0  PHQ- 9 Score 6          Fall Risk     11/24/2023    1:10 PM 11/17/2023    3:02 PM 09/01/2023    3:20 PM 06/18/2023    2:55 PM 03/24/2023    3:05 PM  Fall Risk   Falls  in the past year? 1 1 1 1 1   Number falls in past yr: 1 0 1 1 1   Injury with Fall? 0 0 1 1 1   Risk for fall due to : History of fall(s);Impaired balance/gait;Impaired mobility  History of fall(s)    Follow up Falls evaluation completed;Falls prevention discussed Falls evaluation completed Falls evaluation completed Falls evaluation completed Falls evaluation completed    MEDICARE RISK AT HOME:  Medicare Risk at Home Any stairs in or around the home?: Yes If so, are there any without handrails?: Yes Home free of loose throw rugs in walkways, pet beds, electrical cords, etc?: Yes Adequate lighting in your home to reduce risk of falls?: Yes Life alert?: No Use of a cane, walker or w/c?: Yes Grab bars in the bathroom?: No Shower chair or bench in shower?: Yes Elevated toilet seat or a handicapped toilet?: Yes  TIMED UP AND GO:  Was the test performed?  No  Cognitive Function: 6CIT completed    08/05/2016   10:28 AM  MMSE - Mini Mental State Exam  Orientation to time 5   Orientation to Place 5   Registration 3   Attention/ Calculation 5   Recall 3   Language- name 2 objects 2   Language- repeat 1  Language- follow 3 step command 3   Language- read & follow direction 1   Write a sentence 1   Copy design 1   Total score 30      Data saved with a previous flowsheet row definition      07/03/2020   10:00 AM  Montreal Cognitive Assessment   Visuospatial/ Executive (0/5) 2  Naming (0/3) 3  Attention: Read list of digits (0/2) 2  Attention: Read list of letters (0/1) 1  Attention: Serial 7 subtraction starting at 100 (0/3) 2  Language: Repeat phrase (0/2) 0  Language : Fluency (0/1) 0  Abstraction (0/2) 2  Delayed Recall (0/5) 3  Orientation (0/6) 5  Total 20  Adjusted Score (based on education) 20      11/24/2023    1:22 PM 08/17/2019   12:50 PM 08/13/2018   10:00 AM 08/11/2017   10:33 AM  6CIT Screen  What Year? 0 points 0 points 0 points 0 points  What month? 0  points 0 points 0 points 0 points  What time? 0 points 0 points 0 points 0 points  Count back from 20 0 points 0 points 0 points 0 points  Months in reverse 0 points 0 points 0 points 0 points  Repeat phrase 0 points 0 points 0 points   Total Score 0 points 0 points 0 points     Immunizations Immunization History  Administered Date(s) Administered   Fluad Quad(high Dose 65+) 03/09/2019, 05/16/2021, 03/18/2022   Fluad Trivalent(High Dose 65+) 03/17/2023   Influenza Inj Mdck Quad Pf 03/01/2020   Influenza Split 02/19/2011, 04/10/2014   Influenza, High Dose Seasonal PF 04/01/2013, 02/28/2015, 05/17/2018   Influenza,inj,Quad PF,6+ Mos 03/10/2016, 03/13/2017   Influenza-Unspecified 03/09/2012   PFIZER(Purple Top)SARS-COV-2 Vaccination 06/29/2019, 07/15/2019, 08/05/2019, 04/01/2020  PPD Test 07/13/2023   Pfizer Covid-19 Vaccine Bivalent Booster 22yrs & up 06/13/2021   Pneumococcal Conjugate-13 06/30/2013   Pneumococcal Polysaccharide-23 05/21/2011, 08/06/2016   Tdap 12/19/2010, 03/11/2011   Zoster Recombinant(Shingrix) 12/07/2021, 03/25/2022, 04/19/2022   Zoster, Live 05/09/2012    Screening Tests Health Maintenance  Topic Date Due   DTaP/Tdap/Td (3 - Td or Tdap) 03/10/2021   COVID-19 Vaccine (6 - 2024-25 season) 02/08/2023   HEMOGLOBIN A1C  03/19/2023   OPHTHALMOLOGY EXAM  12/17/2023   INFLUENZA VACCINE  01/08/2024   FOOT EXAM  01/20/2024   Diabetic kidney evaluation - Urine ACR  01/21/2024   Medicare Annual Wellness (AWV)  07/10/2024   Diabetic kidney evaluation - eGFR measurement  09/07/2024   Pneumococcal Vaccine: 50+ Years  Completed   Hepatitis C Screening  Completed   Zoster Vaccines- Shingrix  Completed   HPV VACCINES  Aged Out   Meningococcal B Vaccine  Aged Out   Colonoscopy  Discontinued   Fecal DNA (Cologuard)  Discontinued    Health Maintenance  Health Maintenance Due  Topic Date Due   DTaP/Tdap/Td (3 - Td or Tdap) 03/10/2021   COVID-19 Vaccine (6 -  2024-25 season) 02/08/2023   HEMOGLOBIN A1C  03/19/2023   Health Maintenance Items Addressed: Discussed the need to update Tetanus vaccine. Patient stated that he is no longer a diabetic. A1c order was placed 05/2023 and if needed can have at next office visit. Patient declines covid vaccine.  Additional Screening:  Vision Screening: Recommended annual ophthalmology exams for early detection of glaucoma and other disorders of the eye. Up to date [facility]  Would you like a referral to an eye doctor? No    Dental Screening: Recommended annual dental exams for proper oral hygiene  Community Resource Referral / Chronic Care Management: CRR required this visit?  No   CCM required this visit?  No   Plan:    I have personally reviewed and noted the following in the patient's chart:   Medical and social history Use of alcohol, tobacco or illicit drugs  Current medications and supplements including opioid prescriptions. Patient is not currently taking opioid prescriptions. Functional ability and status Nutritional status Physical activity Advanced directives List of other physicians Hospitalizations, surgeries, and ER visits in previous 12 months Vitals Screenings to include cognitive, depression, and falls Referrals and appointments  In addition, I have reviewed and discussed with patient certain preventive protocols, quality metrics, and best practice recommendations. A written personalized care plan for preventive services as well as general preventive health recommendations were provided to patient.   Felicitas Horse, LPN   1/61/0960   After Visit Summary: (MyChart) Due to this being a telephonic visit, the after visit summary with patients personalized plan was offered to patient via MyChart   Notes: Nothing significant to report at this time.

## 2023-11-24 NOTE — Patient Instructions (Signed)
 Johnathan Arnold , Thank you for taking time out of your busy schedule to complete your Annual Wellness Visit with me. I enjoyed our conversation and look forward to speaking with you again next year. I, as well as your care team,  appreciate your ongoing commitment to your health goals. Please review the following plan we discussed and let me know if I can assist you in the future. Your Game plan/ To Do List    Referrals: If you haven't heard from the office you've been referred to, please reach out to them at the phone provided.  Remember to update your Tetanus vaccine Follow up Visits: Next Medicare AWV with our clinical staff: 11/28/24 @ 1:00   Have you seen your provider in the last 6 months (3 months if uncontrolled diabetes)? Yes Next Office Visit with your provider: 12/02/23  Clinician Recommendations:  Aim for 30 minutes of exercise or brisk walking, 6-8 glasses of water, and 5 servings of fruits and vegetables each day.       This is a list of the screening recommended for you and due dates:  Health Maintenance  Topic Date Due   DTaP/Tdap/Td vaccine (3 - Td or Tdap) 03/10/2021   COVID-19 Vaccine (6 - 2024-25 season) 02/08/2023   Hemoglobin A1C  03/19/2023   Eye exam for diabetics  12/17/2023   Flu Shot  01/08/2024   Complete foot exam   01/20/2024   Yearly kidney health urinalysis for diabetes  01/21/2024   Yearly kidney function blood test for diabetes  09/07/2024   Medicare Annual Wellness Visit  11/23/2024   Pneumococcal Vaccine for age over 78  Completed   Hepatitis C Screening  Completed   Zoster (Shingles) Vaccine  Completed   HPV Vaccine  Aged Out   Meningitis B Vaccine  Aged Out   Colon Cancer Screening  Discontinued   Cologuard (Stool DNA test)  Discontinued    Advanced directives: (Copy Requested) Please bring a copy of your health care power of attorney and living will to the office to be added to your chart at your convenience. You can mail to [facility] [address] [place], [place] [address] or email to [email or web address] Advance Care Planning is important because it:  [x]  Makes sure you receive the medical care that is consistent with your values, goals, and preferences  [x]  It provides guidance to your family and loved ones and reduces their decisional burden about whether or not they are making the right decisions based on your wishes.

## 2023-11-25 ENCOUNTER — Encounter: Payer: Self-pay | Admitting: Cardiovascular Disease

## 2023-11-26 ENCOUNTER — Emergency Department

## 2023-11-26 ENCOUNTER — Ambulatory Visit

## 2023-11-26 ENCOUNTER — Emergency Department: Admission: EM | Admit: 2023-11-26 | Discharge: 2023-11-26 | Disposition: A | Discharge status: Home / Self Care

## 2023-11-26 ENCOUNTER — Ambulatory Visit: Payer: Medicare Other | Admitting: Physical Therapy

## 2023-11-26 ENCOUNTER — Other Ambulatory Visit: Payer: Self-pay

## 2023-11-26 DIAGNOSIS — W19XXXA Unspecified fall, initial encounter: Secondary | ICD-10-CM

## 2023-11-26 DIAGNOSIS — I700 Atherosclerosis of aorta: Secondary | ICD-10-CM | POA: Diagnosis not present

## 2023-11-26 DIAGNOSIS — M4802 Spinal stenosis, cervical region: Secondary | ICD-10-CM | POA: Diagnosis not present

## 2023-11-26 DIAGNOSIS — S0081XA Abrasion of other part of head, initial encounter: Secondary | ICD-10-CM | POA: Insufficient documentation

## 2023-11-26 DIAGNOSIS — E119 Type 2 diabetes mellitus without complications: Secondary | ICD-10-CM | POA: Diagnosis not present

## 2023-11-26 DIAGNOSIS — R262 Difficulty in walking, not elsewhere classified: Secondary | ICD-10-CM

## 2023-11-26 DIAGNOSIS — S42001A Fracture of unspecified part of right clavicle, initial encounter for closed fracture: Secondary | ICD-10-CM | POA: Insufficient documentation

## 2023-11-26 DIAGNOSIS — S42031A Displaced fracture of lateral end of right clavicle, initial encounter for closed fracture: Secondary | ICD-10-CM | POA: Diagnosis not present

## 2023-11-26 DIAGNOSIS — S199XXA Unspecified injury of neck, initial encounter: Secondary | ICD-10-CM | POA: Diagnosis not present

## 2023-11-26 DIAGNOSIS — M6281 Muscle weakness (generalized): Secondary | ICD-10-CM

## 2023-11-26 DIAGNOSIS — R2689 Other abnormalities of gait and mobility: Secondary | ICD-10-CM

## 2023-11-26 DIAGNOSIS — M50222 Other cervical disc displacement at C5-C6 level: Secondary | ICD-10-CM | POA: Diagnosis not present

## 2023-11-26 DIAGNOSIS — R9082 White matter disease, unspecified: Secondary | ICD-10-CM | POA: Diagnosis not present

## 2023-11-26 DIAGNOSIS — M5030 Other cervical disc degeneration, unspecified cervical region: Secondary | ICD-10-CM | POA: Diagnosis not present

## 2023-11-26 DIAGNOSIS — S20211A Contusion of right front wall of thorax, initial encounter: Secondary | ICD-10-CM

## 2023-11-26 DIAGNOSIS — W1809XA Striking against other object with subsequent fall, initial encounter: Secondary | ICD-10-CM | POA: Insufficient documentation

## 2023-11-26 DIAGNOSIS — J90 Pleural effusion, not elsewhere classified: Secondary | ICD-10-CM | POA: Diagnosis not present

## 2023-11-26 DIAGNOSIS — M19011 Primary osteoarthritis, right shoulder: Secondary | ICD-10-CM | POA: Diagnosis not present

## 2023-11-26 DIAGNOSIS — R2681 Unsteadiness on feet: Secondary | ICD-10-CM

## 2023-11-26 DIAGNOSIS — R269 Unspecified abnormalities of gait and mobility: Secondary | ICD-10-CM

## 2023-11-26 DIAGNOSIS — S299XXA Unspecified injury of thorax, initial encounter: Secondary | ICD-10-CM | POA: Diagnosis not present

## 2023-11-26 DIAGNOSIS — G20C Parkinsonism, unspecified: Secondary | ICD-10-CM | POA: Diagnosis not present

## 2023-11-26 DIAGNOSIS — I10 Essential (primary) hypertension: Secondary | ICD-10-CM | POA: Diagnosis not present

## 2023-11-26 DIAGNOSIS — S4991XA Unspecified injury of right shoulder and upper arm, initial encounter: Secondary | ICD-10-CM | POA: Diagnosis present

## 2023-11-26 MED ORDER — ACETAMINOPHEN 325 MG PO TABS
650.0000 mg | ORAL_TABLET | Freq: Once | ORAL | Status: AC
  Administered 2023-11-26: 650 mg via ORAL
  Filled 2023-11-26: qty 2

## 2023-11-26 MED ORDER — OXYCODONE HCL 5 MG PO TABS: 5.0000 mg | ORAL_TABLET | Freq: Three times a day (TID) | ORAL | 0 refills | Status: DC | PRN

## 2023-11-26 NOTE — Progress Notes (Signed)
   11/26/23 1430  Spiritual Encounters  Type of Visit Follow up  Care provided to: Patient  Referral source Chaplain assessment  Reason for visit Routine spiritual support  OnCall Visit No  Spiritual Framework  Presenting Themes Impactful experiences and emotions;Goals in life/care  Interventions  Spiritual Care Interventions Made Self-care teaching;Encouragement  Intervention Outcomes  Outcomes Awareness of support

## 2023-11-26 NOTE — Progress Notes (Signed)
   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 Chaplain stayed w/Pt; got him a warm blanket until he was called back to get xrays and CT.)  Interventions  Spiritual Care Interventions Made Established relationship of care and support;Compassionate presence;Reflective listening  Intervention Outcomes  Outcomes Connection to spiritual care;Awareness around self/spiritual resourses;Awareness of health;Awareness of support

## 2023-11-26 NOTE — ED Provider Notes (Signed)
 [facility] Provider Note    Event Date/Time   First MD Initiated Contact with Patient 11/26/23 1213     (approximate)   History   Fall   HPI  Johnathan Arnold. is a 78 y.o. male with history of diabetes, hypertension, Parkinson's presents emergency department after a fall while he was at physical therapy today.  States they were leaving the physical therapy room to go across to the wellness gym when his legs gave way and he fell hitting his head and his right shoulder and now has neck pain, abrasion to the face, and pain at the right ribs.  Denies chest pain/shortness of breath.  States it was a mechanical fall.      Physical Exam   Triage Vital Signs: ED Triage Vitals  Encounter Vitals Group     BP 11/26/23 1158 (!) 158/80     Girls Systolic BP Percentile --      Girls Diastolic BP Percentile --      Boys Systolic BP Percentile --      Boys Diastolic BP Percentile --      Pulse Rate 11/26/23 1158 66     Resp 11/26/23 1158 18     Temp 11/26/23 1158 97.9 F (36.6 C)     Temp Source 11/26/23 1158 Oral     SpO2 11/26/23 1158 100 %     Weight 11/26/23 1159 152 lb 1.9 oz (69 kg)     Height 11/26/23 1159 5' 6 (1.676 m)     Head Circumference --      Peak Flow --      Pain Score 11/26/23 1158 5     Pain Loc --      Pain Education --      Exclude from Growth Chart --     Most recent vital signs: Vitals:   11/26/23 1158  BP: (!) 158/80  Pulse: 66  Resp: 18  Temp: 97.9 F (36.6 C)  SpO2: 100%     General: Awake, no distress.   CV:  Good peripheral perfusion.  Resp:  Normal effort. Abd:  No distention.   Other:  Small abrasion noted to the right side of the face near the eye, C-spine tender to palpation, right shoulder tender to palpation, right ribs tender to palpation, neurovascular appears to be intact, cranial nerves II through XII grossly intact patient is a baseline   ED Results / Procedures / Treatments   Labs (all labs  ordered are listed, but only abnormal results are displayed) Labs Reviewed - No data to display   EKG     RADIOLOGY Ct head, cspine, chest Right shoulder xray    PROCEDURES:   Procedures  Critical Care:  no Chief Complaint  Patient presents with   Fall      MEDICATIONS ORDERED IN ED: Medications  acetaminophen  (TYLENOL ) tablet 650 mg (650 mg Oral Given 11/26/23 1248)     IMPRESSION / MDM / ASSESSMENT AND PLAN / ED COURSE  I reviewed the triage vital signs and the nursing notes.                              Differential diagnosis includes, but is not limited to, subdural, SAH, fracture, contusion, strain, rib fracture, pneumothorax  Patient's presentation is most consistent with acute illness / injury with system symptoms.    Medications given: Tylenol  1 p.o. for pain  CT head,  C-spine, chest, ; x-ray of the right shoulder  CT of the head, C-spine, and chest are all independently reviewed interpreted by me as being negative for acute abnormality  X-ray of the right shoulder does show a fracture of the right clavicle, radiologist comments minimally displaced.  This was independently reviewed and interpreted by me  I did explain findings to the patient and his wife.  He still having some pain.  I did offer oxycodone  while he is here in the ED but he deferred.  Would like for me to send a prescription to the pharmacy if he needs that he will use it.  He was placed in a sling.  He is to apply ice.  Follow-up with his doctor.  Return emergency department if worsening.  See orthopedics in 1 week.  He is in agreement with treatment plan.  Discharged stable condition.        FINAL CLINICAL IMPRESSION(S) / ED DIAGNOSES   Final diagnoses:  Closed displaced fracture of right clavicle, unspecified part of clavicle, initial encounter  Fall, initial encounter  Rib contusion, right, initial encounter     Rx / DC Orders   ED Discharge Orders          Ordered     oxyCODONE  (ROXICODONE ) 5 MG immediate release tablet  Every 8 hours PRN        11/26/23 1432             Note:  This document was prepared using Dragon voice recognition software and may include unintentional dictation errors.    Gasper Devere ORN, PA-C 11/26/23 1451    Clarine Ozell LABOR, MD 11/28/23 [phone]

## 2023-11-26 NOTE — Therapy (Signed)
 OUTPATIENT PHYSICAL THERAPY NEURO TREATMENT   Patient Name: Johnathan Arnold. MRN: [id number] DOB:12/20/1945, 78 y.o., male Today's Date: 11/26/2023  PCP: Thersia Flax, MD  REFERRING PROVIDER: Thersia Flax, MD   END OF SESSION:  PT End of Session - 11/26/23 1232     Visit Number 25    Number of Visits 40    Date for PT Re-Evaluation 01/07/24    Progress Note Due on Visit 30    PT Start Time 1101    PT Stop Time 1115    PT Time Calculation (min) 14 min    Equipment Utilized During Treatment Gait belt    Activity Tolerance Patient tolerated treatment well    Behavior During Therapy [facility] for tasks assessed/performed                       Past Medical History:  Diagnosis Date   3-vessel coronary artery disease    s/p  5 vessel CABG   Allergy June 2024   Diabetes mellitus without complication (HCC)    History of cardiac catheterization 2011   [facility]   Hyperlipidemia    Hypertension    Hypertriglyceridemia    Neuromuscular disorder [facility]) October 2017   Diagonis Parkinson   Parkinson's disease [facility])    Pneumonia 12/28/2020   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 LIMA to LAD, SVG to D1, OM1 and RPDA   CORONARY ARTERY BYPASS GRAFT  03/1998   5 vessel, [facility]   RIGHT HEART CATH N/A 04/04/2019   Procedure: RIGHT HEART CATH;  Surgeon: Wenona Hamilton, MD;  Location: [facility] INVASIVE CV LAB;  Service: Cardiovascular;  Laterality: N/A;   RIGHT/LEFT HEART CATH AND CORONARY ANGIOGRAPHY N/A 02/01/2018   Procedure: RIGHT/LEFT HEART CATH AND CORONARY ANGIOGRAPHY;  Surgeon: Wenona Hamilton, MD;  Location: [facility] INVASIVE CV LAB;  Service: Cardiovascular;  Laterality: N/A;   Patient Active Problem List   Diagnosis Date Noted   Diverticulitis 07/26/2023   RSV (respiratory syncytial virus pneumonia) 07/26/2023   Impaired ambulation 07/02/2023    Orthostatic hypotension 05/22/2023   Generalized weakness 05/07/2021   Bradycardia    Anemia, unspecified 02/07/2021   Neutropenia (HCC) 01/29/2021   Thrombocytopenia (HCC) 01/29/2021   Right lower lobe pneumonia 12/28/2020   Hyponatremia 12/22/2020   Bilateral leg weakness 12/20/2020   Prostate cancer screening 09/08/2020   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) 09/06/2020   Low back pain 08/19/2019   Pulmonary hypertension (HCC)    Insomnia 12/21/2018   Sleep apnea in adult 12/09/2018   Periodic limb movement disorder 12/09/2018   Pulmonary nodules 05/18/2018   Wears hearing aid in both ears 05/17/2018   Leg pain, bilateral 02/20/2018   Dyspnea    CKD stage 3a, GFR 45-59 ml/min (HCC) 09/21/2017   History of skin cancer in adulthood 08/14/2016   Parkinson's disease (HCC) 08/09/2016   Bilateral carotid artery stenosis 04/02/2015   Vertigo, peripheral 10/17/2014   Benign prostatic hyperplasia with urinary frequency 01/31/2014   Obesity 04/03/2013   Other malaise and fatigue 09/21/2012   Hyperlipidemia    Essential hypertension    3-vessel coronary artery disease    S/P CABG x 5     ONSET DATE: 07/03/23  REFERRING DIAG: B14.782 (ICD-10-CM) - Weakness of both lower extremities   THERAPY DIAG:  No diagnosis  found.  Rationale for Evaluation and Treatment: Rehabilitation  SUBJECTIVE:                                                                                                                                                                                             SUBJECTIVE STATEMENT:  Pt states he is okay today. Legs are not feeling too weak today. Had a cramp in his R gastroc this morning and is a little sore. No notable changes since last visit. Is going to the beach next week.    Pt accompanied by: significant other  PERTINENT HISTORY:  From prior PT evaluation:  PD, history of COVID and significant weakness present following this  diagnosis and the use of paxlovid ( August 5 dx date, thinks Paxlovid may have exacerbated PD symptoms)    Pt reports having COVID and having significant weakness.  Patient reports he also has back pain that is not present for a long time but was exacerbated more recently.  Patient previously did physical therapy for his back and experienced some relief but has not been consistent with the exercises.  Patient also has history of going to Parkinson's rock steady classes multiple times per week prior to onset of his COVID but he has not been back since. Patient previously ambulated without an assistive device but is now ambulating with a straight point cane.  Patient reports increased foot and ankle weakness in comparison with his hips and knees.  Patient also reports low back pain that is exacerbated with prolonged standing.  PAIN:  Are you having pain? No  PRECAUTIONS: Fall  RED FLAGS: None   WEIGHT BEARING RESTRICTIONS: No  FALLS: Has patient fallen in last 6 months? Yes. Number of falls 6  LIVING ENVIRONMENT: Lives with: lives with their spouse Lives in: House/apartment Stairs: Yes: Internal: 15 steps; on left going up and External: 1 steps; none Has following equipment at home: Quad cane large base and Walker - 2 wheeled  PLOF: Independent  PATIENT GOALS: to improve overall strength, get back to walking more regularly  OBJECTIVE:  Note: Objective measures were completed at Evaluation unless otherwise noted.  DIAGNOSTIC FINDINGS:   EXAM: CT HEAD WITHOUT CONTRAST  IMPRESSION: 1. No acute intracranial process. 2. Mild chronic small vessel ischemic changes. 3. Acute right frontal and maxillary sinusitis.  COGNITION: Overall cognitive status: Within functional limits for tasks assessed   SENSATION: WFL  COORDINATION: WFL    LOWER EXTREMITY ROM:     Active  Right Eval Left Eval  Hip flexion    Hip extension    Hip abduction    Hip adduction  Hip internal  rotation    Hip external rotation    Knee flexion    Knee extension    Ankle dorsiflexion    Ankle plantarflexion    Ankle inversion    Ankle eversion     (Blank rows = not tested)  LOWER EXTREMITY MMT:    MMT Right Eval Left Eval  Hip flexion 4 4  Hip abduction 4 4  Hip adduction 4 4  Knee flexion 3+ 3+  Knee extension 4- 4-  Ankle dorsiflexion 4+ 4  (Blank rows = not tested)  BED MOBILITY:  No limitations according to the pt.  TRANSFERS: Assistive device utilized: None  Sit to stand: Complete Independence Stand to sit: Complete Independence Chair to chair: Complete Independence Floor: Not tested  FUNCTIONAL TESTS:  5 times sit to stand: 15.03 sec Timed up and go (TUG): 13.38 sec 6 minute walk test: TBD 10 meter walk test: 12.15 sec; 0.82 m/s Dynamic Gait Index: TBD  PATIENT SURVEYS:  ABC scale 70.6%                                                                                                                              TREATMENT DATE: 11/26/23   NMR,  Seated PWR up 2 x 10, cues for optimal postural activation  Seated PWR! Up with STS 2 x 10   -PWR! Up targets postural strengthening and antigravity extension   Instructed patient the next activities will be performed in the well zone gym.  Physical therapist and patient walked to [place] and gym entrance and physical therapist holds door open for patient as he walks through.  Upon walking through and taking a couple steps through the door patient legs gave out he collapsed hitting his right flank and shoulder on a table in the well zone and then falling to the floor.  Physical therapist immediately responded asking for responsiveness from patient.  Patient is responsive and reports his right shoulder was hurting.  Patient was assisted to rolling on his side as a physical therapist could see his eyes and face.  Patient had small scratch lateral to his right eye but reported no head pain at that moment just pain  in his shoulder.  Physical therapist then immediately informed [facility] staff to call a code for a code team to come and check on the patient.  While code team was in around physical therapist and was on staff assist patient with positioning into quadruped position and eventually seated position in a standard chair.  Physical therapist was in staff ensure patient does not put any stress through his right upper extremity during the transfer.  When code team arrived encouraged patient to be checked on the emergency department in order to ensure patient not sustain any injuries that need to be immediately addressed.  Patient was then brought to emergency department by code team staff.  PATIENT EDUCATION: Education details: Pt educated throughout session about proper posture  and technique with exercises. Improved exercise technique, movement at target joints, use of target muscles after min to mod verbal, visual, tactile cues  Person educated: Patient and Spouse Education method: Explanation, Demonstration, and Verbal cues Education comprehension: verbalized understanding  HOME EXERCISE PROGRAM: Seated PWR! Moves x 10 ea, handout provided   GOALS: Goals reviewed with patient? Yes  SHORT TERM GOALS: Target date: 09/14/2023    Pt will be independent with HEP in order to demonstrate increased ability to perform tasks related to occupation/hobbies. Baseline:  Pt to be given HEP at next visit. Goal status: INITIAL  LONG TERM GOALS: Target date: 11/09/2023  1.  Patient (> 5 years old) will complete five times sit to stand test in < 15 seconds indicating an increased LE strength and improved balance. Baseline: 15.03 sec 4/14: 11.79 sec  5/29: 11.06 sec Goal status: MET  2.  Patient will increase their ABC scale score to be 80.6% (an increase of 10 points), demonstrating improved balance confidence and reduced fall risk, as measured through weekly assessments. Baseline: 70.6% 4/14:72 % 5/29:  74% Goal status: INITIAL   3.  Patient will increase Berg Balance score by > 6 points to demonstrate decreased fall risk during functional activities. Baseline: 43 on 3/13 4/14: 49 5/29: 56 Goal status: MET   4.  Patient will reduce timed up and go to <11 seconds to reduce fall risk and demonstrate improved transfer/gait ability. Baseline: 13.38 sec 4/14: 11.4 sec no AD 5/29: 10.78 sec Goal status: MET  5.  Patient will increase 10 meter walk test to >1.54m/s as to improve gait speed for better community ambulation and to reduce fall risk. Baseline: 12.15 sec; 0.82 m/s 5/29: 1.06 m/s Goal status: MET  6.  Patient will increase six minute walk test distance to >1000 for progression to community ambulator and improve gait ability Baseline: 459 ft with RW stopping at 5:30 due to fatigue  09/21/23:1128 ft with 4WW  Goal status: MET  7.  Patient will increased Minibest by at least 3 points  to reduce fall risk and demonstrate improved transfer/gait ability. Baseline: 11/09/23: 22 Goal status: initial.   8.  Patient will be independent with gym-based exercise program that he can complete 2-3 times weekly to build his lower extremity strength and postural strength Baseline: no gym program, wants to continue to progress leg strength  Goal status: Initial    ASSESSMENT:  CLINICAL IMPRESSION:  Session started off as normal with Parkinson specific interventions.  Upon going to the well zone to complete some lower and upper extremity strengthening patient sustained a fall as described below.  Instructed patient the next activities will be performed in the well zone gym.  Physical therapist and patient walked to [place] and gym entrance and physical therapist holds door open for patient as he walks through.  Upon walking through and taking a couple steps through the door patient legs gave out he collapsed hitting his right flank and shoulder on a table in the well zone and then falling to the floor.   Physical therapist immediately responded asking for responsiveness from patient.  Patient is responsive and reports his right shoulder was hurting.  Patient was assisted to rolling on his side as a physical therapist could see his eyes and face.  Patient had small scratch lateral to his right eye but reported no head pain at that moment just pain in his shoulder.  Physical therapist then immediately informed [facility] staff to call a code for  a code team to come and check on the patient.  While code team was in around physical therapist and was on staff assist patient with positioning into quadruped position and eventually seated position in a standard chair.  Physical therapist was in staff ensure patient does not put any stress through his right upper extremity during the transfer.  When code team arrived encouraged patient to be checked on the emergency department in order to ensure patient not sustain any injuries that need to be immediately addressed.  Patient was then brought to emergency department by code team staff.   OBJECTIVE IMPAIRMENTS: Abnormal gait, decreased activity tolerance, decreased balance, decreased endurance, decreased knowledge of use of DME, decreased mobility, difficulty walking, decreased ROM, decreased strength, and decreased safety awareness.   ACTIVITY LIMITATIONS: carrying, lifting, bending, standing, squatting, bathing, and locomotion level  PARTICIPATION LIMITATIONS: cleaning, laundry, driving, shopping, community activity, and yard work  PERSONAL FACTORS: Age, Education, Past/current experiences, Time since onset of injury/illness/exacerbation, and 3+ comorbidities: orthostatic HTN, syncope, falls, anemia, insomnia, dyspnea are also affecting patient's functional outcome.   REHAB POTENTIAL: Fair pt has been seen in the past.  CLINICAL DECISION MAKING: Evolving/moderate complexity  EVALUATION COMPLEXITY: Moderate  PLAN:  PT FREQUENCY: 2x/week  PT DURATION: 8  weeks  PLANNED INTERVENTIONS: 97110-Therapeutic exercises, 97530- Therapeutic activity, 97112- Neuromuscular re-education, 97535- Self Care, 40102- Manual therapy, [phone]- Gait training, Balance training, Stair training, and Vestibular training  PLAN FOR NEXT SESSION:  Continue  well zone training as appropriate   Balance and strength interventions PD specific interventions as indicated   Note: Portions of this document were prepared using Dragon voice recognition software and although reviewed may contain unintentional dictation errors in syntax, grammar, or spelling.  Edwina Gram PT ,DPT Physical Therapist- [facility]   11/26/23, 12:32 PM

## 2023-11-26 NOTE — ED Notes (Addendum)
 This RN responded to rapid response on patient in wellness center. Pt was sitting in chair, A & O. Per staff in wellness center pt was walking into the wellness center and started to feel dizzy. Pt fell hitting the right side of his head/face and right collar bone on a table.  Pt has small abrasion beside right eye and redness on his on the right side of his forehead. No deformity noted in the right shoulder/collar bone area. Pt takes Aspirin  81 mg, no other blood thinner. Pt was brought by wheelchair to the ED for evaluation.

## 2023-11-26 NOTE — ED Triage Notes (Signed)
 Pt states he was at rehab PT for his Parkinson's and states his  L knee just quit on him, pt states he fell and hit his head, no thinner use. Pt denies LOC.

## 2023-11-26 NOTE — ED Notes (Signed)
 See triage note  Presents with pain to right shoulder and right side of head   States his leg gave out

## 2023-11-26 NOTE — Discharge Instructions (Signed)
 You can take Tylenol  650 mg or 1000 mg every 8 hours for pain as needed Oxycodone for pain not controlled by these medications.  If you take the oxycodone take a stool softener so that you will not become constipated.  Also be careful as this may make you drowsy

## 2023-11-28 ENCOUNTER — Other Ambulatory Visit: Payer: Self-pay | Admitting: Cardiovascular Disease

## 2023-11-30 ENCOUNTER — Ambulatory Visit

## 2023-11-30 ENCOUNTER — Ambulatory Visit: Payer: Medicare Other | Admitting: Physical Therapy

## 2023-12-01 ENCOUNTER — Other Ambulatory Visit: Payer: Self-pay

## 2023-12-01 MED ORDER — ROSUVASTATIN CALCIUM 10 MG PO TABS: 10.0000 mg | ORAL_TABLET | Freq: Every day | ORAL | 2 refills | Status: AC

## 2023-12-02 ENCOUNTER — Ambulatory Visit: Admitting: Internal Medicine

## 2023-12-03 ENCOUNTER — Ambulatory Visit: Payer: Medicare Other | Admitting: Physical Therapy

## 2023-12-07 ENCOUNTER — Ambulatory Visit: Payer: Medicare Other | Admitting: Physical Therapy

## 2023-12-10 ENCOUNTER — Ambulatory Visit: Payer: Medicare Other | Admitting: Physical Therapy

## 2023-12-10 ENCOUNTER — Ambulatory Visit: Admitting: Occupational Therapy

## 2023-12-14 ENCOUNTER — Ambulatory Visit

## 2023-12-14 ENCOUNTER — Ambulatory Visit: Payer: Medicare Other | Admitting: Physical Therapy

## 2023-12-14 ENCOUNTER — Ambulatory Visit: Attending: Physician Assistant | Admitting: Physician Assistant

## 2023-12-14 ENCOUNTER — Encounter: Payer: Self-pay | Admitting: Physician Assistant

## 2023-12-14 VITALS — BP 115/40 | HR 66 | Ht 66.0 in | Wt 155.0 lb

## 2023-12-14 DIAGNOSIS — E785 Hyperlipidemia, unspecified: Secondary | ICD-10-CM | POA: Insufficient documentation

## 2023-12-14 DIAGNOSIS — I779 Disorder of arteries and arterioles, unspecified: Secondary | ICD-10-CM | POA: Insufficient documentation

## 2023-12-14 DIAGNOSIS — I2510 Atherosclerotic heart disease of native coronary artery without angina pectoris: Secondary | ICD-10-CM | POA: Insufficient documentation

## 2023-12-14 DIAGNOSIS — E1169 Type 2 diabetes mellitus with other specified complication: Secondary | ICD-10-CM | POA: Diagnosis not present

## 2023-12-14 DIAGNOSIS — I272 Pulmonary hypertension, unspecified: Secondary | ICD-10-CM | POA: Insufficient documentation

## 2023-12-14 DIAGNOSIS — S42034A Nondisplaced fracture of lateral end of right clavicle, initial encounter for closed fracture: Secondary | ICD-10-CM | POA: Diagnosis not present

## 2023-12-14 DIAGNOSIS — I10 Essential (primary) hypertension: Secondary | ICD-10-CM | POA: Diagnosis present

## 2023-12-14 NOTE — Progress Notes (Signed)
 Cardiology Office Note    Date:  12/14/2023   ID:  Johnathan Loh., DOB Apr 13, 1946, MRN [id number]  PCP:  Johnathan Verneita CROME, MD  Cardiologist:  Johnathan Cage, MD  Electrophysiologist:  None   Chief Complaint: Follow up  History of Present Illness:   Johnathan Klees. is a 78 y.o. 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   R/LHC in 01/2018 showed occluded native arteries with patent grafts including LIMA to LAD, SVG to diagonal, SVG to OM, and SVG to distal RCA.  RHC showed normal filling pressures, mild pulmonary hypertension, and normal cardiac output.  Previous carotid Doppler showed mild nonobstructive bilateral disease.  He has a history of chronic bilateral exertional leg pain with normal lower extremity arterial Doppler in 2018.  In the setting of moderate to severe pulmonary hypertension, prior VQ scan was low probability for PE.  RHC in 03/2019 showed moderate pulmonary hypertension at 54 over 13 mmHg with a wedge pressure of 17 mmHg, and a PVR 2.22.  Pulmonary hypertension was felt to be of a mixed etiology.  He was admitted to the hospital in 05/2021 with a fall and generalized weakness, felt to be mostly polypharmacy-induced, including benzodiazepines and antihypertensive medications.  MRI of the brain was nonacute.  CT of the chest was negative for PE.  During the admission, he was noted to be bradycardic in the 40s bpm leading carvedilol  to be discontinued.   Zio patch in 02/2022 showed a predominant rhythm of sinus with an average rate of 58 bpm with a range of 45 to 176 bpm, 7 episodes of SVT lasting up to 13.8 seconds, and rare PACs and PVCs.  Carotid artery ultrasound in 03/2023 showed 1 to 39% bilateral ICA stenosis.   He was admitted in 06/2023 after presenting  with generalized weakness and was treated for UTI and underlying Parkinson's.  Losartan  was held.  He was admitted in 07/2023 after presenting with generalized weakness and found to have RSV, right lower lobe pneumonia, and diverticulitis.  He was most recently admitted to the hospital in 07/2023, again presenting with generalized weakness and fatigue with recommendation to hold PTA clonazepam .  At time of discharge, ranolazine  was held (discharge summary indicates patient was not taking medication).  He was last seen in the office in 09/2023 and was doing well from a cardiac perspective, without symptoms of angina or cardiac decompensation.  He was back on losartan  with discontinuation of hydralazine  and remained off ranolazine .  Blood pressure readings in the morning prior to taking medications were greater than 200 mmHg systolic at times with well-controlled blood pressure in the office.  It was recommended he take an additional 50 mg of losartan  in the evening with continuation of 50 mg losartan  in the morning along with Imdur  60 mg.  He comes in today doing well from a cardiac perspective and is without symptoms of angina or cardiac decompensation.  No dizziness, presyncope, or syncope.  Since he was last seen he has had multiple mechanical falls, most recently in 11/2023 leading to a fractured right clavicle that is followed by orthopedics.  He reports continued elevations in BP first thing in the morning prior to taking his medications in the 170s to 190s systolic and reports his blood pressure improves with the  morning losartan  and Imdur .  Blood pressure is well-controlled in the office today 115 systolic.  No significant lower extremity swelling or progressive orthopnea.   Labs independently reviewed: 09/2023 - Hgb 10.8, PLT 149, magnesium 1.4, potassium 4.2, BUN 11, serum creatinine 1.26, albumin  3.8 07/2023 - AST/ALT normal 09/2022 - TSH normal, A1c 5.9, direct LDL 32, TC 113, TG 86, HDL 60, LDL  36  Past Medical History:  Diagnosis Date   3-vessel coronary artery disease    s/p  5 vessel CABG   Allergy June 2024   Diabetes mellitus without complication (HCC)    History of cardiac catheterization 2011   [facility]   Hyperlipidemia    Hypertension    Hypertriglyceridemia    Neuromuscular disorder [facility]) October 2017   Diagonis Parkinson   Parkinson's disease [facility])    Pneumonia 12/28/2020   S/P CABG x 5 04/1998   Vertigo     Past Surgical History:  Procedure Laterality Date   CARDIAC CATHETERIZATION  05-19-2010   [facility]: Patent grafts. LIMA to LAD, SVG to D1, OM1 and RPDA   CORONARY ARTERY BYPASS GRAFT  03/1998   5 vessel, [facility]   RIGHT HEART CATH N/A 04/04/2019   Procedure: RIGHT HEART CATH;  Surgeon: Johnathan Arnold LABOR, MD;  Location: [facility] INVASIVE CV LAB;  Service: Cardiovascular;  Laterality: N/A;   RIGHT/LEFT HEART CATH AND CORONARY ANGIOGRAPHY N/A 02/01/2018   Procedure: RIGHT/LEFT HEART CATH AND CORONARY ANGIOGRAPHY;  Surgeon: Johnathan Arnold LABOR, MD;  Location: [facility] INVASIVE CV LAB;  Service: Cardiovascular;  Laterality: N/A;    Current Medications: Current Meds  Medication Sig   acetaminophen  (TYLENOL ) 500 MG tablet Take 500 mg by mouth as needed.   aspirin  EC 81 MG tablet Take 1 tablet (81 mg total) by mouth at bedtime.   carbidopa -levodopa  (SINEMET  CR) 50-200 MG tablet Take 1 tablet by mouth at bedtime.   carbidopa -levodopa  (SINEMET  IR) 25-100 MG tablet 2 AT 8:00AM , 2 AT 11:00AM, 2 AT 2:00PM AND 1 AT 5:00PM   furosemide  (LASIX ) 20 MG tablet Take 1 tablet (20 mg total) by mouth daily as needed (for swelling).   hydrocortisone  cream 1 % Apply topically 2 (two) times daily.   isosorbide  mononitrate (IMDUR ) 60 MG 24 hr tablet TAKE 1 TABLET BY MOUTH DAILY   losartan  (COZAAR ) 50 MG tablet Take 1 tablet (50 mg total) by mouth in the morning and at bedtime.   Magnesium Salicylate 325 MG TABS Take 1 each by mouth daily.   melatonin 3 MG TABS tablet Take 1.5 mg by  mouth at bedtime as needed.   Multiple Vitamin (MULTIVITAMIN) tablet Take 1 tablet by mouth daily.   nitroGLYCERIN  (NITROSTAT ) 0.4 MG SL tablet Place 1 tablet (0.4 mg total) under the tongue every 5 (five) minutes as needed for chest pain.   omeprazole  (PRILOSEC) 20 MG capsule Take 1 capsule (20 mg total) by mouth every morning.   ondansetron  (ZOFRAN ) 4 MG tablet Take 1 tablet (4 mg total) by mouth every 6 (six) hours as needed for nausea or vomiting.   oxyCODONE  (ROXICODONE ) 5 MG immediate release tablet Take 1 tablet (5 mg total) by mouth every 8 (eight) hours as needed for up to 12 doses.   polyethylene glycol (MIRALAX  / GLYCOLAX ) packet Take 17 g by mouth daily as needed.   rosuvastatin  (CRESTOR ) 10 MG tablet Take 1 tablet (10 mg total) by mouth daily.   tamsulosin  (FLOMAX ) 0.4 MG CAPS capsule Take 1 capsule (0.4 mg total) by  mouth daily.   traZODone  (DESYREL ) 50 MG tablet Take 0.5-1 tablets (25-50 mg total) by mouth at bedtime as needed for sleep.    Allergies:   Paxlovid [nirmatrelvir-ritonavir]   Social History   Socioeconomic History   Marital status: Married    Spouse name: Not on file   Number of children: 2   Years of education: Not on file   Highest education level: Master's degree (e.g., MA, MS, MEng, MEd, MSW, MBA)  Occupational History   Occupation: retired    Comment: IT work  Tobacco Use   Smoking status: Never    Passive exposure: Yes   Smokeless tobacco: Never  Vaping Use   Vaping status: Never Used  Substance and Sexual Activity   Alcohol use: Yes    Comment: occasional beer   Drug use: No   Sexual activity: Not Currently  Other Topics Concern   Not on file  Social History Narrative   Right Handed    Lives in a two story home    Social Drivers of Health   Financial Resource Strain: Low Risk  (11/24/2023)   Overall Financial Resource Strain (CARDIA)    Difficulty of Paying Living Expenses: Not hard at all  Food Insecurity: No Food Insecurity  (11/24/2023)   Hunger Vital Sign    Worried About Running Out of Food in the Last Year: Never true    Ran Out of Food in the Last Year: Never true  Transportation Needs: No Transportation Needs (11/24/2023)   PRAPARE - Administrator, Civil Service (Medical): No    Lack of Transportation (Non-Medical): No  Physical Activity: Sufficiently Active (11/24/2023)   Exercise Vital Sign    Days of Exercise per Week: 3 days    Minutes of Exercise per Session: 60 min  Stress: Stress Concern Present (11/24/2023)   [employer] of Occupational Health - Occupational Stress Questionnaire    Feeling of Stress: To some extent  Social Connections: Socially Integrated (11/24/2023)   Social Connection and Isolation Panel    Frequency of Communication with Friends and Family: More than three times a week    Frequency of Social Gatherings with Friends and Family: Twice a week    Attends Religious Services: More than 4 times per year    Active Member of [employer] or Organizations: Yes    Attends Engineer, structural: More than 4 times per year    Marital Status: Married     Family History:  The patient's family history includes Healthy in his son; Heart attack (age of onset: 41) in his father; Heart attack (age of onset: 39) in his mother; Heart disease in his brother, brother, father, and mother; Hypertension in his mother.  ROS:   12-point review of systems is negative unless otherwise noted in the HPI.   EKGs/Labs/Other Studies Reviewed:    Studies reviewed were summarized above. The additional studies were reviewed today:  Carotid artery ultrasound 03/20/2023: Summary:  Right Carotid: Velocities in the right ICA are consistent with a 1-39%  stenosis.   Left Carotid: Velocities in the left ICA are consistent with a 1-39%  stenosis.  The ECA appears >50% stenosed.   Vertebrals:  Bilateral vertebral arteries demonstrate antegrade flow.  Subclavians: Normal flow hemodynamics  were seen in bilateral subclavian arteries.  __________   Zio patch 02/2022: Patient had a min HR of 45 bpm, max HR of 176 bpm, and avg HR of 58 bpm. Predominant underlying rhythm was Sinus Rhythm.  7 Supraventricular Tachycardia runs occurred, the run with the fastest interval lasting 4 beats with a max rate of 176 bpm, the  longest lasting 13.8 secs with an avg rate of 110 bpm.  Rare PACs and rare PVCs. __________   Carotid artery ultrasound 05/07/2021: IMPRESSION: 1. No hemodynamically significant stenosis or occlusive disease within either internal carotid artery. 2. Estimated 50% or greater stenosis at the origin/proximal ECA on the right. ___________   2D echo 01/31/2021:  1. Left ventricular ejection fraction, by estimation, is 60 to 65%. The  left ventricle has normal function. The left ventricle has no regional  wall motion abnormalities. Left ventricular diastolic parameters are  consistent with Grade II diastolic  dysfunction (pseudonormalization).   2. Right ventricular systolic function is low normal. The right  ventricular size is normal. There is moderately elevated pulmonary artery  systolic pressure. The estimated right ventricular systolic pressure is  57.0 mmHg.   3. Left atrial size was mildly dilated.   4. The mitral valve is normal in structure. Mild mitral valve  regurgitation.   5. The aortic valve is tricuspid. Aortic valve regurgitation is not  visualized.   6. The inferior vena cava is dilated in size with >50% respiratory  variability, suggesting right atrial pressure of 8 mmHg. __________   Aortic and iliac artery ultrasound 11/15/2020: Summary:  Abdominal Aorta:  No evidence of an abdominal aortic aneurysm was visualized.  The largest aortic measurement is 1.9 cm.   Stenosis:  Brisk bi/triphasic flow in the common and external iliac arteries, without  significant plaque or stenosis identified. __________   ABIs 11/15/2020: ABI/TBIToday's  ABIToday's TBIPrevious ABIPrevious TBI  +-------+-----------+-----------+------------+------------+  Right  1.15       1.00       1.25        1.16          +-------+-----------+-----------+------------+------------+  Left   1.13       0.87       1.29        1.08          +-------+-----------+-----------+------------+------------+   Bilateral ABIs appear essentially unchanged compared to prior study on  02/18/17.     Summary:  Right: Resting right ankle-brachial index is within normal range.  No evidence of significant right lower extremity arterial disease. The  right toe-brachial index is normal.   Left: Resting left ankle-brachial index is within normal range.  No evidence of significant left lower extremity arterial disease. The left  toe-brachial index is normal. __________   RHC 04/04/2019: Successful right heart catheterization via the right brachial vein. RA pressure: 10/15 with a mean of 13 mmHg. RV: 56/4 with an end-diastolic pressure of 16 mmHg PA: 54/13 with a mean of 29 mmHg.  Pulmonary vascular resistance is 2.22 Woods units Pulmonary capillary wedge pressure is 17 mmHg with prominent V waves. Cardiac output is 5.4 L/min with a cardiac index of 2.79.   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  He does have prominent V waves suggestive of significant mitral regurgitation.  I did review his recent echocardiogram done in September and his moderate regurgitation was moderate at most. His volume status appears to be reasonable and I am hesitant to diurese him further given underlying chronic kidney disease. __________   2D echo 02/23/2019: 1. The left ventricle has normal systolic function with an ejection  fraction of 60-65%. The cavity size was normal. Left ventricular diastolic  Doppler parameters are consistent  with pseudonormalization.   2. The right ventricle has normal systolic function. The cavity was  moderately enlarged. There is Right vetricular wall thickness was not  assessed. Right ventricular systolic pressure is severely elevated with an  estimated pressure of 61 mmHg.   3. Left atrial size was mild-moderately dilated.   4. Right atrial size was mildly dilated.   5. No evidence of mitral valve stenosis.   6. Pulmonic valve regurgitation is mild by color flow Doppler.   7. The aorta is normal unless otherwise noted.   8. The inferior vena cava was is dilated in size with <50% respiratory  variability, suggesting right atrial pressure of 15 mmHg. __________   [facility]: Prox Cx lesion is 100% stenosed. Prox LAD lesion is 100% stenosed. Prox RCA to Mid RCA lesion is 90% stenosed. SVG and is normal in caliber. The graft exhibits no disease. SVG and is normal in caliber. The graft exhibits no disease. Ost 2nd Diag lesion is 100% stenosed. SVG and is normal in caliber. The graft exhibits no disease. Dist RCA lesion is 100% stenosed. LIMA graft was visualized by angiography and is normal in caliber. The graft exhibits no disease. Mid LAD lesion is 60% stenosed. Ost RPDA lesion is 60% stenosed.   1.  Occluded native coronary arteries with patent grafts including LIMA to LAD, SVG to diagonal, SVG to OM and SVG to distal RCA.  No significant graft disease.  Suspect proximal artery ischemia given occlusion of native vessels. 2.  Left ventricular angiography was not performed due to chronic kidney disease. 3.  Right heart catheterization showed normal filling pressures, mild pulmonary hypertension and normal cardiac output.  Mean PA pressure was 23 mmHg, pulmonary capillary wedge pressure was 9 mmHg and cardiac output was 7.7 L/min.   Recommendations: Continue medical therapy. __________   2D echo 03/19/2017: - Left ventricle: The cavity size was normal. There was mild     concentric hypertrophy. Systolic function was normal. The    estimated ejection fraction was in the range of 60% to 65%. Wall    motion was normal; there were no regional wall motion    abnormalities. Left ventricular diastolic function parameters    were normal.  - Mitral valve: There was mild regurgitation.  - Left atrium: The atrium was mildly dilated.  - Pulmonary arteries: Systolic pressure was mildly increased. PA    peak pressure: 45 mm Hg (S). __________   Lexiscan  MPI 03/17/2017: There is a small in size, mild in severity, reversible defect involving the mid anteroseptal segment consistent with mild ischemia. There is a small in size, severe, fixed defect at the apex that most likely represents artifact (attenuation and apical thinning) and less likely scar. The left ventricular ejection fraction is normal (60%). Patient was unable to reach target heart rate with exercise; the study was converted to Lexiscan . Upsloping ST segment depression ST segment depression of 1 mm was noted during stress in the I, II, V5 and V6 leads after administration of Lexiscan . __________   See Epic for remaining images.   EKG:  EKG is not ordered today.    Recent Labs: 08/06/2023: ALT 17 09/08/2023: BUN 11; Creatinine, Ser 1.26; Hemoglobin 10.8; Magnesium 1.4; Platelets 149.0; Potassium 4.2; Sodium 143  Recent Lipid Panel    Component Value Date/Time   CHOL 113 09/17/2022 1003   TRIG 86.0 09/17/2022 1003  HDL 60.10 09/17/2022 1003   CHOLHDL 2 09/17/2022 1003   VLDL 17.2 09/17/2022 1003   LDLCALC 36 09/17/2022 1003   LDLDIRECT 32.0 09/17/2022 1003    PHYSICAL EXAM:    VS:  BP (!) 115/40 (BP Location: Left Arm, Patient Position: Sitting, Cuff Size: Normal)   Pulse 66   Ht 5' 6 (1.676 m)   Wt 155 lb (70.3 kg)   SpO2 100%   BMI 25.02 kg/m   BMI: Body mass index is 25.02 kg/m.  Physical Exam Vitals reviewed.  Constitutional:      Appearance: He is well-developed.  HENT:     Head:  Normocephalic and atraumatic.  Eyes:     General:        Right eye: No discharge.        Left eye: No discharge.  Cardiovascular:     Rate and Rhythm: Normal rate and regular rhythm.     Heart sounds: S1 normal and S2 normal. Heart sounds not distant. No midsystolic click and no opening snap. Murmur heard.     Systolic murmur is present with a grade of 2/6 at the upper right sternal border.     No friction rub.  Pulmonary:     Effort: Pulmonary effort is normal. No respiratory distress.     Breath sounds: Normal breath sounds. No decreased breath sounds, wheezing, rhonchi or rales.  Chest:     Chest wall: No tenderness.  Musculoskeletal:     Cervical back: Normal range of motion.     Right lower leg: No edema.     Left lower leg: No edema.  Skin:    General: Skin is warm and dry.     Nails: There is no clubbing.  Neurological:     Mental Status: He is alert and oriented to person, place, and time.     Motor: Tremor present.  Psychiatric:        Speech: Speech normal.        Behavior: Behavior normal.        Thought Content: Thought content normal.        Judgment: Judgment normal.     Wt Readings from Last 3 Encounters:  12/14/23 155 lb (70.3 kg)  11/26/23 152 lb 1.9 oz (69 kg)  11/24/23 153 lb (69.4 kg)     ASSESSMENT & PLAN:   CAD status post CABG without angina: He is doing well and without symptoms concerning for angina or cardiac decompensation.  Remains on aspirin  81 mg and Imdur  60 mg.  No indication for further ischemic testing at this time.  Pulmonary hypertension: Asymptomatic.  Not needing as needed furosemide .  HTN: Blood pressure is well-controlled in the office today.  He reports BP readings in the 170s to 190s systolic prior to taking his morning medications.  With well-controlled blood pressure and history of falls, I am hesitant to escalate pharmacotherapy at this time.  He will check his blood pressure multiple points throughout the day and update us  in  2 weeks time.  For now, remains on losartan  50 mg twice daily and Imdur  60 mg daily.  HLD: LDL 36 in 09/2022.  Remains on rosuvastatin  10 mg.  Carotid artery disease: Ultrasound in 03/2023 showed 1 to 39% bilateral ICA stenosis with antegrade flow of the bilateral vertebral arteries and normal flow hemodynamics in the bilateral subclavian arteries.  Follow-up imaging in 03/2024.  Remains on aspirin  and rosuvastatin .     Disposition: F/u with Dr. Darron or an  APP in 4 months.   Medication Adjustments/Labs and Tests Ordered: Current medicines are reviewed at length with the patient today.  Concerns regarding medicines are outlined above. Medication changes, Labs and Tests ordered today are summarized above and listed in the Patient Instructions accessible in Encounters.   Signed, Johnathan Bring, PA-C 12/14/2023 1:08 PM     [facility] [place] [address] [place], [place] [address] [phone]

## 2023-12-14 NOTE — Patient Instructions (Signed)
 Medication Instructions:  Your physician recommends that you continue on your current medications as directed. Please refer to the Current Medication list given to you today.   *If you need a refill on your cardiac medications before your next appointment, please call your pharmacy*  Lab Work: None ordered at this time   Follow-Up: At [facility] you and your health needs are our priority.  As part of our continuing mission to provide you with exceptional heart care, our providers are all part of one team.  This team includes your primary Cardiologist (physician) and Advanced Practice Providers or APPs (Physician Assistants and Nurse Practitioners) who all work together to provide you with the care you need, when you need it.  Your next appointment:   4 month(s)  Provider:   You may see Deatrice Cage, MD or Bernardino Bring, PA-C  Other Instructions Please call (or MyChart message) us  in 2 weeks with your blood pressure readings.

## 2023-12-17 ENCOUNTER — Ambulatory Visit: Admitting: Occupational Therapy

## 2023-12-17 ENCOUNTER — Ambulatory Visit: Payer: Medicare Other | Admitting: Physical Therapy

## 2023-12-21 ENCOUNTER — Ambulatory Visit: Payer: Medicare Other | Attending: Internal Medicine | Admitting: Physical Therapy

## 2023-12-21 ENCOUNTER — Ambulatory Visit

## 2023-12-21 DIAGNOSIS — M6281 Muscle weakness (generalized): Secondary | ICD-10-CM | POA: Diagnosis not present

## 2023-12-21 DIAGNOSIS — R262 Difficulty in walking, not elsewhere classified: Secondary | ICD-10-CM | POA: Insufficient documentation

## 2023-12-21 DIAGNOSIS — R269 Unspecified abnormalities of gait and mobility: Secondary | ICD-10-CM | POA: Diagnosis not present

## 2023-12-21 DIAGNOSIS — M5459 Other low back pain: Secondary | ICD-10-CM | POA: Insufficient documentation

## 2023-12-21 DIAGNOSIS — R29898 Other symptoms and signs involving the musculoskeletal system: Secondary | ICD-10-CM | POA: Diagnosis not present

## 2023-12-21 DIAGNOSIS — R2681 Unsteadiness on feet: Secondary | ICD-10-CM | POA: Insufficient documentation

## 2023-12-21 DIAGNOSIS — R2689 Other abnormalities of gait and mobility: Secondary | ICD-10-CM | POA: Diagnosis not present

## 2023-12-21 DIAGNOSIS — R278 Other lack of coordination: Secondary | ICD-10-CM

## 2023-12-21 NOTE — Therapy (Signed)
 OUTPATIENT PHYSICAL THERAPY NEURO TREATMENT   Patient Name: Johnathan Arnold. MRN: [id number] DOB:05/07/1946, 78 y.o., male Today's Date: 12/21/2023  PCP: Marylynn Verneita CROME, MD  REFERRING PROVIDER: Marylynn Verneita CROME, MD   END OF SESSION:  PT End of Session - 12/21/23 1215     Visit Number 26    Number of Visits 40    Date for PT Re-Evaluation 01/07/24    Progress Note Due on Visit 30    PT Start Time 1145    PT Stop Time 1225    PT Time Calculation (min) 40 min    Equipment Utilized During Treatment Gait belt    Activity Tolerance Patient tolerated treatment well    Behavior During Therapy [facility] for tasks assessed/performed                        Past Medical History:  Diagnosis Date   3-vessel coronary artery disease    s/p  5 vessel CABG   Allergy June 2024   Diabetes mellitus without complication (HCC)    History of cardiac catheterization 2011   [facility]   Hyperlipidemia    Hypertension    Hypertriglyceridemia    Neuromuscular disorder [facility]) October 2017   Diagonis Parkinson   Parkinson's disease [facility])    Pneumonia 12/28/2020   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 LIMA to LAD, SVG to D1, OM1 and RPDA   CORONARY ARTERY BYPASS GRAFT  03/1998   5 vessel, [facility]   RIGHT HEART CATH N/A 04/04/2019   Procedure: RIGHT HEART CATH;  Surgeon: Darron Deatrice LABOR, MD;  Location: [facility] INVASIVE CV LAB;  Service: Cardiovascular;  Laterality: N/A;   RIGHT/LEFT HEART CATH AND CORONARY ANGIOGRAPHY N/A 02/01/2018   Procedure: RIGHT/LEFT HEART CATH AND CORONARY ANGIOGRAPHY;  Surgeon: Darron Deatrice LABOR, MD;  Location: [facility] INVASIVE CV LAB;  Service: Cardiovascular;  Laterality: N/A;   Patient Active Problem List   Diagnosis Date Noted   Diverticulitis 07/26/2023   RSV (respiratory syncytial virus pneumonia) 07/26/2023   Impaired ambulation 07/02/2023    Orthostatic hypotension 05/22/2023   Generalized weakness 05/07/2021   Bradycardia    Anemia, unspecified 02/07/2021   Neutropenia (HCC) 01/29/2021   Thrombocytopenia (HCC) 01/29/2021   Right lower lobe pneumonia 12/28/2020   Hyponatremia 12/22/2020   Bilateral leg weakness 12/20/2020   Prostate cancer screening 09/08/2020   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) 09/06/2020   Low back pain 08/19/2019   Pulmonary hypertension (HCC)    Insomnia 12/21/2018   Sleep apnea in adult 12/09/2018   Periodic limb movement disorder 12/09/2018   Pulmonary nodules 05/18/2018   Wears hearing aid in both ears 05/17/2018   Leg pain, bilateral 02/20/2018   Dyspnea    CKD stage 3a, GFR 45-59 ml/min (HCC) 09/21/2017   History of skin cancer in adulthood 08/14/2016   Parkinson's disease (HCC) 08/09/2016   Bilateral carotid artery stenosis 04/02/2015   Vertigo, peripheral 10/17/2014   Benign prostatic hyperplasia with urinary frequency 01/31/2014   Obesity 04/03/2013   Other malaise and fatigue 09/21/2012   Hyperlipidemia    Essential hypertension    3-vessel coronary artery disease    S/P CABG x 5     ONSET DATE: 07/03/23  REFERRING DIAG: M70.101 (ICD-10-CM) - Weakness of both lower extremities   THERAPY DIAG:  No  diagnosis found.  Rationale for Evaluation and Treatment: Rehabilitation  SUBJECTIVE:                                                                                                                                                                                             SUBJECTIVE STATEMENT:  Pt reports feeling better since his fall and clavicular fracture. Not having pain in his shoulder at the moment but still has pain in area of fracture at times. Pt is out lf sling during day but still wearing it to sleep.    Pt accompanied by: significant other  PERTINENT HISTORY:  From prior PT evaluation:  PD, history of COVID and significant weakness  present following this diagnosis and the use of paxlovid ( August 5 dx date, thinks Paxlovid may have exacerbated PD symptoms)    Pt reports having COVID and having significant weakness.  Patient reports he also has back pain that is not present for a long time but was exacerbated more recently.  Patient previously did physical therapy for his back and experienced some relief but has not been consistent with the exercises.  Patient also has history of going to Parkinson's rock steady classes multiple times per week prior to onset of his COVID but he has not been back since. Patient previously ambulated without an assistive device but is now ambulating with a straight point cane.  Patient reports increased foot and ankle weakness in comparison with his hips and knees.  Patient also reports low back pain that is exacerbated with prolonged standing.  PAIN:  Are you having pain? No  PRECAUTIONS: Fall  RED FLAGS: None   WEIGHT BEARING RESTRICTIONS: No  FALLS: Has patient fallen in last 6 months? Yes. Number of falls 6  LIVING ENVIRONMENT: Lives with: lives with their spouse Lives in: House/apartment Stairs: Yes: Internal: 15 steps; on left going up and External: 1 steps; none Has following equipment at home: Quad cane large base and Walker - 2 wheeled  PLOF: Independent  PATIENT GOALS: to improve overall strength, get back to walking more regularly  OBJECTIVE:  Note: Objective measures were completed at Evaluation unless otherwise noted.  DIAGNOSTIC FINDINGS:   EXAM: CT HEAD WITHOUT CONTRAST  IMPRESSION: 1. No acute intracranial process. 2. Mild chronic small vessel ischemic changes. 3. Acute right frontal and maxillary sinusitis.  COGNITION: Overall cognitive status: Within functional limits for tasks assessed   SENSATION: WFL  COORDINATION: WFL    LOWER EXTREMITY ROM:     Active  Right Eval Left Eval  Hip flexion    Hip extension    Hip abduction  Hip  adduction    Hip internal rotation    Hip external rotation    Knee flexion    Knee extension    Ankle dorsiflexion    Ankle plantarflexion    Ankle inversion    Ankle eversion     (Blank rows = not tested)  LOWER EXTREMITY MMT:    MMT Right Eval Left Eval  Hip flexion 4 4  Hip abduction 4 4  Hip adduction 4 4  Knee flexion 3+ 3+  Knee extension 4- 4-  Ankle dorsiflexion 4+ 4  (Blank rows = not tested)  BED MOBILITY:  No limitations according to the pt.  TRANSFERS: Assistive device utilized: None  Sit to stand: Complete Independence Stand to sit: Complete Independence Chair to chair: Complete Independence Floor: Not tested  FUNCTIONAL TESTS:  5 times sit to stand: 15.03 sec Timed up and go (TUG): 13.38 sec 6 minute walk test: TBD 10 meter walk test: 12.15 sec; 0.82 m/s Dynamic Gait Index: TBD  PATIENT SURVEYS:  ABC scale 70.6%                                                                                                                              TREATMENT DATE: 12/21/23   TE- To improve strength, endurance, mobility, and function of specific targeted muscle groups or improve joint range of motion or improve muscle flexibility  Nustep level 3 for B UE and LE reciprocal movemnt training. Careful placement of RLE and special instruction for AAROM on R UE and frequent checking in for pain/ discomfort, none noted throughout   Seated LAQ 2 x 10 ea LE with 3# AW   NMR: To facilitate reeducation of movement, balance, posture, coordination, and/or proprioception/kinesthetic sense.  Seated PWR! Step with 3# AW 2 x 10 ea LE   Standing PWR! Step at support bar with 3# AW 2 x 10 ea LE   PWR! Up STS 2 x 10 with min UE movement ( just extension movement in standing)   Gait with 2WW and 3# AW x 325 ft, cues for picking up feet and for posture.   Unless otherwise stated, CGA was provided and gait belt donned in order to ensure pt safety  Self care:   Pt has been  having back pain recently and was instructed to use walker for gait for a week to offload low back musculature secondary to his kyphotic posture. Pt verbalized understanding of instructions. Will reassess if back pain is lessened with this.   Pt instructed in importance of resting between days of heavy LE strength training to prevent legs feeling excessively weak and tired. Gave examples of appropriate schedules for rock steady and PT.   PATIENT EDUCATION: Education details: Pt educated throughout session about proper posture and technique with exercises. Improved exercise technique, movement at target joints, use of target muscles after min to mod verbal, visual, tactile cues  Person educated: Patient and Spouse Education method:  Explanation, Demonstration, and Verbal cues Education comprehension: verbalized understanding  HOME EXERCISE PROGRAM: Seated PWR! Moves x 10 ea, handout provided   GOALS: Goals reviewed with patient? Yes  SHORT TERM GOALS: Target date: 09/14/2023    Pt will be independent with HEP in order to demonstrate increased ability to perform tasks related to occupation/hobbies. Baseline:  Pt to be given HEP at next visit. Goal status: INITIAL  LONG TERM GOALS: Target date: 11/09/2023  1.  Patient (> 46 years old) will complete five times sit to stand test in < 15 seconds indicating an increased LE strength and improved balance. Baseline: 15.03 sec 4/14: 11.79 sec  5/29: 11.06 sec Goal status: MET  2.  Patient will increase their ABC scale score to be 80.6% (an increase of 10 points), demonstrating improved balance confidence and reduced fall risk, as measured through weekly assessments. Baseline: 70.6% 4/14:72 % 5/29: 74% Goal status: INITIAL   3.  Patient will increase Berg Balance score by > 6 points to demonstrate decreased fall risk during functional activities. Baseline: 43 on 3/13 4/14: 49 5/29: 56 Goal status: MET   4.  Patient will reduce timed up and go  to <11 seconds to reduce fall risk and demonstrate improved transfer/gait ability. Baseline: 13.38 sec 4/14: 11.4 sec no AD 5/29: 10.78 sec Goal status: MET  5.  Patient will increase 10 meter walk test to >1.1m/s as to improve gait speed for better community ambulation and to reduce fall risk. Baseline: 12.15 sec; 0.82 m/s 5/29: 1.06 m/s Goal status: MET  6.  Patient will increase six minute walk test distance to >1000 for progression to community ambulator and improve gait ability Baseline: 459 ft with RW stopping at 5:30 due to fatigue  09/21/23:1128 ft with 4WW  Goal status: MET  7.  Patient will increased Minibest by at least 3 points  to reduce fall risk and demonstrate improved transfer/gait ability. Baseline: 11/09/23: 22 Goal status: initial.   8.  Patient will be independent with gym-based exercise program that he can complete 2-3 times weekly to build his lower extremity strength and postural strength Baseline: no gym program, wants to continue to progress leg strength  Goal status: Initial    ASSESSMENT:  CLINICAL IMPRESSION:  Patient arrived with good motivation for completion of pt activities. Pt completes lower extremity strength, and PD specific movement training with min UE use throughout session. PT was very observant of any shoulder stress or pain and no activities caused any discomfort this date. Will continue to slowly progress pt LE strength and mitigate R UE stress, at least until next MD visit. Pt will continue to benefit from skilled physical therapy intervention to address impairments, improve QOL, and attain therapy goals.     OBJECTIVE IMPAIRMENTS: Abnormal gait, decreased activity tolerance, decreased balance, decreased endurance, decreased knowledge of use of DME, decreased mobility, difficulty walking, decreased ROM, decreased strength, and decreased safety awareness.   ACTIVITY LIMITATIONS: carrying, lifting, bending, standing, squatting, bathing, and  locomotion level  PARTICIPATION LIMITATIONS: cleaning, laundry, driving, shopping, community activity, and yard work  PERSONAL FACTORS: Age, Education, Past/current experiences, Time since onset of injury/illness/exacerbation, and 3+ comorbidities: orthostatic HTN, syncope, falls, anemia, insomnia, dyspnea are also affecting patient's functional outcome.   REHAB POTENTIAL: Fair pt has been seen in the past.  CLINICAL DECISION MAKING: Evolving/moderate complexity  EVALUATION COMPLEXITY: Moderate  PLAN:  PT FREQUENCY: 2x/week  PT DURATION: 8 weeks  PLANNED INTERVENTIONS: 97110-Therapeutic exercises, 97530- Therapeutic activity, [id number]- Neuromuscular re-education,  [id number]- Self Care, [id number]- Manual therapy, [phone]- Gait training, Balance training, Stair training, and Vestibular training  PLAN FOR NEXT SESSION:  Continue  well zone training as appropriate   Balance and strength interventions PD specific interventions as indicated   Note: Portions of this document were prepared using Dragon voice recognition software and although reviewed may contain unintentional dictation errors in syntax, grammar, or spelling.  Lonni KATHEE Gainer PT ,DPT Physical Therapist- [facility]   12/21/23, 12:16 PM

## 2023-12-21 NOTE — Therapy (Unsigned)
 OUTPATIENT OCCUPATIONAL THERAPY NEURO TREATMENT NOTE  Patient Name: Johnathan Arnold. MRN: [id number] DOB:1945-10-06, 78 y.o., male Today's Date: 12/21/2023  PCP: Dr. Verneita Kettering REFERRING PROVIDER: Dr. Verneita Kettering  END OF SESSION:  OT End of Session - 12/21/23 1129     Visit Number 6    Number of Visits 24    Date for OT Re-Evaluation 01/28/24    OT Start Time 1100    OT Stop Time 1145    OT Time Calculation (min) 45 min    Activity Tolerance Patient tolerated treatment well    Behavior During Therapy [facility] for tasks assessed/performed         Past Medical History:  Diagnosis Date   3-vessel coronary artery disease    s/p  5 vessel CABG   Allergy June 2024   Diabetes mellitus without complication (HCC)    History of cardiac catheterization 2011   [facility]   Hyperlipidemia    Hypertension    Hypertriglyceridemia    Neuromuscular disorder [place]) October 2017   Diagonis Parkinson   Parkinson's disease [facility])    Pneumonia 12/28/2020   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 LIMA to LAD, SVG to D1, OM1 and RPDA   CORONARY ARTERY BYPASS GRAFT  03/1998   5 vessel, [facility]   RIGHT HEART CATH N/A 04/04/2019   Procedure: RIGHT HEART CATH;  Surgeon: Darron Deatrice LABOR, MD;  Location: [facility] INVASIVE CV LAB;  Service: Cardiovascular;  Laterality: N/A;   RIGHT/LEFT HEART CATH AND CORONARY ANGIOGRAPHY N/A 02/01/2018   Procedure: RIGHT/LEFT HEART CATH AND CORONARY ANGIOGRAPHY;  Surgeon: Darron Deatrice LABOR, MD;  Location: [facility] INVASIVE CV LAB;  Service: Cardiovascular;  Laterality: N/A;   Patient Active Problem List   Diagnosis Date Noted   Diverticulitis 07/26/2023   RSV (respiratory syncytial virus pneumonia) 07/26/2023   Impaired ambulation 07/02/2023   Orthostatic hypotension 05/22/2023   Generalized weakness 05/07/2021   Bradycardia    Anemia, unspecified 02/07/2021    Neutropenia (HCC) 01/29/2021   Thrombocytopenia (HCC) 01/29/2021   Right lower lobe pneumonia 12/28/2020   Hyponatremia 12/22/2020   Bilateral leg weakness 12/20/2020   Prostate cancer screening 09/08/2020   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) 09/06/2020   Low back pain 08/19/2019   Pulmonary hypertension (HCC)    Insomnia 12/21/2018   Sleep apnea in adult 12/09/2018   Periodic limb movement disorder 12/09/2018   Pulmonary nodules 05/18/2018   Wears hearing aid in both ears 05/17/2018   Leg pain, bilateral 02/20/2018   Dyspnea    CKD stage 3a, GFR 45-59 ml/min (HCC) 09/21/2017   History of skin cancer in adulthood 08/14/2016   Parkinson's disease (HCC) 08/09/2016   Bilateral carotid artery stenosis 04/02/2015   Vertigo, peripheral 10/17/2014   Benign prostatic hyperplasia with urinary frequency 01/31/2014   Obesity 04/03/2013   Other malaise and fatigue 09/21/2012   Hyperlipidemia    Essential hypertension    3-vessel coronary artery disease    S/P CABG x 5    ONSET DATE: 07/03/23 (hospitalization); diagnosed with PD 2017  REFERRING DIAG: Parkinson's Disease  THERAPY DIAG:  Muscle weakness (generalized)  Other lack of coordination  Rationale for Evaluation and Treatment: Rehabilitation  SUBJECTIVE:  SUBJECTIVE STATEMENT: Pt reports doing well today.  Pt brought along a button up shirt for practice as requested by OT last visit.  Pt accompanied by: self  PERTINENT HISTORY: Per chart on 08/07/23: [name]. is a 78 y.o. male with medical history significant of CAD status post CABG, type 2 diabetes, Parkinson disease, hypertension presenting with weakness.     Patient noted to have been admitted 07/26/23, 07/02/23 for weakness and were treated for issues including right lower lobe pneumonia, RSV, diverticulitis.  Per the son, patient has had progressively worsening weakness and fatigue at home.  Has grown more lethargic over the past 1  to 2 days PTA with patient not getting out of bed.   Pt also reports xx of L3 fx and pelvic fx.  PRECAUTIONS: Fall  WEIGHT BEARING RESTRICTIONS: No  PAIN: No pain at present  Are you having pain? Yes: NPRS scale: 0/10, can reach 6-7/10 pain Pain location: low back Pain description: achy Aggravating factors: prolonged standing Relieving factors: tylenol /rest   FALLS: Has patient fallen in last 6 months? Yes. Number of falls several falls   LIVING ENVIRONMENT: Lives with: lives with their spouse Lives in: House Stairs: 15 steps, 1 rail on the left going up, 1 external step without rails Has following equipment at home: shower chair, RW, comfort height toilet, large base quad cane   PLOF: Independent, handwriting legible for check writing and addressing envelopes prior to this Winter  PATIENT GOALS: I hope to improve my writing or printing.   OBJECTIVE:  Note: Objective measures were completed at Evaluation unless otherwise noted.  HAND DOMINANCE: Right  ADLs: Overall ADLs: Pt reports decline in ADLs since this Winter after multiple hospitalizations Transfers/ambulation related to ADLs: intermittent use of a RW if back pain is present, mobility is slower without walker  Eating: tremors present when manipulation eating utensils (worse on the R but still does eat with the R); messy, food drops (ok with stabbing food but not balancing food on fork).  Spills frequently when drinking from a glass Grooming: able to shave with a blade but reports this to be slightly challenging  UB Dressing: difficulty with buttoning shirts; spouse buttons most of his shirts  LB Dressing: challenging to button and zip up slacks, able to tie shoes without difficulty Toileting: indep  Bathing: distant supv for a shower  Tub Shower transfers: distant supv using walk in shower with shower chair Equipment: see above  IADLs:             Shopping: Spouse manages as pt gets tired walking  Light  housekeeping: spouse manages most of it; pt reports he can mop the kitchen floor  Meal Prep: pt can manage light meal prep; must use both hands to pour a drink pitcher and carries items in the L hand Community mobility: present today without AD; Pt reports that spouse mostly drives, but he is able Medication management: Pt reports being indep with pill set up with weekly pill organizer for pt and spouse's meds; very rarely reports dropping pills; pt reports he can be forgetful to take pills on occasion, and takes PD meds every 3 hours. Financial management: Pt manages but reports recent decline with legibility when writing checks.  Pt reports he writes 15-20 checks a month  and wrote a check 1-2 weeks ago that was illegible.  Pt states that he had to type out the address and return address on the envelope. Handwriting: Printed and signed name with good legibility today, but with mild micrographia  MOBILITY STATUS: See PT eval  ACTIVITY TOLERANCE:  Activity tolerance: WFL for tasks assessed; to  be further assessed in functional contexts Pt reports he has participated in [employer] 3 days a week since 2017  FUNCTIONAL OUTCOME MEASURES: TBD  UPPER EXTREMITY ROM:  WFL   UPPER EXTREMITY MMT:     MMT Right eval Left eval  Shoulder flexion 4+ 4+  Shoulder abduction 4+ 4+  Shoulder adduction    Shoulder extension    Shoulder internal rotation    Shoulder external rotation    Middle trapezius    Lower trapezius    Elbow flexion 5 5  Elbow extension 5 5  Wrist flexion 5 5  Wrist extension 5 5  Wrist ulnar deviation    Wrist radial deviation    Wrist pronation    Wrist supination    (Blank rows = not tested)  HAND FUNCTION: Grip strength: Right: 65 lbs; Left: 65 lbs, Lateral pinch: Right: 14 lbs, Left: 13 lbs, and 3 point pinch: Right: 12 lbs, Left: 10 lbs 12/21/23: Grip strength: Right: 65 lbs, 65 lbs; Lateral pinch: Right 13, Left: 13 lbs, and 3 point pinch: Right: 11 lbs,  Left 12 lbs   COORDINATION: 9 Hole Peg test: Right: 31 sec; Left: 30 sec 9 hole Peg test: Right: 29 sec; Left: 29 sec   SENSATION: BUEs WFL  EDEMA: No visible edema  MUSCLE TONE: No noted rigidity or increased tone in BUEs  COGNITION: Overall cognitive status: WFL for tasks assessed, but pt does endorse some decreased memory  VISION: Cataracts removed both eyes a couple years ago; wears reading glasses   OBSERVATIONS:  Mild resting and action tremors in BUEs, R>L                                                                                              TREATMENT DATE: 11/19/23  Self Care: -Education/demonstration/practice trials with and without button hook for buttons on dress shirt collar buttons, front buttons, cuff buttons, pants buttons. -Education/demonstration/practice trials with self feeding AE for improving control/reducing spills with food and drink  -trials with weighted spoon vs standard, swivel spork, built up fork, rocker knife, scoop plate, divided plate  -Recommendation to identify from home or obtain a heavier glass or cup with lid/straw/handle for reducing spills -Review of positioning strategies to maximize distal BUE stability: elbows/forearms on table top, seat pushed up close to table  PATIENT EDUCATION: Education details: ADL compensation strategies/AE/options to obtain AE Person educated: pt/spouse Education method: explanation/vc/demo Education comprehension: verbalized understanding, demonstrated understanding, further training needed  HOME EXERCISE PROGRAM: -green theraputty -handwriting  GOALS: Goals reviewed with patient?   SHORT TERM GOALS: Target date: 12/17/23  Pt will be indep with HEP with use of visual handout for improving/maintaining bilat hand strength/coordination for daily tasks. Baseline: Eval: HEP not yet initiated. Goal status: INITIAL  2.  Pt will verbalize and implement 1-2 compensatory strategies to improve handwriting  legibility for writing checks with min vc or less. Baseline: Eval: Further training needed; mild micrographia and legibility is inconsistent  Goal status: INITIAL  LONG TERM GOALS: Target date: 01/28/24  Pt will write 3 consecutive checks for bill paying with 100% legibility.  Baseline: Eval: Pt reports that he wrote a check 1-2 weeks ago that was illegible Goal status: INITIAL  2.  Pt will be indep to manage shirt and pants buttons and zippers with modified indep using AE as needed at least 75% of the time. Baseline: Eval: Pt reports difficulty with shirt and pants buttons and zippers and reports spouse buttons most of his shirts. Goal status: INITIAL  3.  Pt will report consistent medication adherence at least 5 of 7 days a week using a phone alarm or other external reminder. Baseline: Eval: Pt endorses memory deficits and needing to set up his phone alarm for med management, but states he got frustrated with an attempt at setting this up on his phone. Goal status: INITIAL   4.  Pt will verbalize and implement 1-2 compensatory strategies with min vc or less to reduce spilling and dropped food during meal times.    Baseline: Eval: Pt endorses messy eating d/t tremors/food dropping from utensils and drink spills   Goal status: INITIAL  ASSESSMENT:  CLINICAL IMPRESSION: Pt found button hook to be effective for managing pants button and smaller collar and shirt cuff buttons.  OT issued button hook and encouraged pt continue to practice with a variety of button up clothing at home to ease efficiency with buttons.  Pt also demonstrated improved control of spoon when using weighted spoon vs standard.  OT advised on options to obtain.  Pt/spouse receptive to all compensation strategies and positioning education.  Will continue to follow up on practice trials in home when using these strategies and or with any AE obtained.  Pt will continue to benefit from skilled OT for continued focus on  handwriting skills, instruction in ADL compensation/AE, and HEP review in order to maximize indep with ADL/IADL tasks while adapting to physical/cognitive/functional changes with a neurodegenerative disease.   PERFORMANCE DEFICITS: in functional skills including ADLs, IADLs, coordination, dexterity, strength, pain, Fine motor control, Gross motor control, mobility, endurance, decreased knowledge of use of DME, and UE functional use, cognitive skills including emotional, memory, problem solving, and thought, and psychosocial skills including coping strategies, environmental adaptation, habits, and routines and behaviors.   IMPAIRMENTS: are limiting patient from ADLs, IADLs, and leisure.   CO-MORBIDITIES: has co-morbidities such as   that affects occupational performance. Patient will benefit from skilled OT to address above impairments and improve overall function.  MODIFICATION OR ASSISTANCE TO COMPLETE EVALUATION: Min-Moderate modification of tasks or assist with assess necessary to complete an evaluation.  OT OCCUPATIONAL PROFILE AND HISTORY: Detailed assessment: Review of records and additional review of physical, cognitive, psychosocial history related to current functional performance.  CLINICAL DECISION MAKING: Moderate - several treatment options, min-mod task modification necessary  REHAB POTENTIAL: Good  EVALUATION COMPLEXITY: Moderate    PLAN:  OT FREQUENCY: 2x/week  OT DURATION: 12 weeks  PLANNED INTERVENTIONS: 97168 OT Re-evaluation, 97535 self care/ADL training, 02889 therapeutic exercise, 97530 therapeutic activity, 97112 neuromuscular re-education, 97140 manual therapy, 97010 moist heat, psychosocial skills training, energy conservation, coping strategies training, patient/family education, and DME and/or AE instructions  RECOMMENDED OTHER SERVICES: None at this time (pt currently working with PT)  CONSULTED AND AGREED WITH PLAN OF CARE: Patient and family  member/caregiver  PLAN FOR NEXT SESSION: See above  Inocente Blazing, MS, OTR/L  Inocente MARLA Blazing, OT 12/21/2023, 11:30 AM

## 2023-12-24 ENCOUNTER — Ambulatory Visit: Admitting: Occupational Therapy

## 2023-12-24 ENCOUNTER — Ambulatory Visit: Payer: Medicare Other | Admitting: Physical Therapy

## 2023-12-24 DIAGNOSIS — M6281 Muscle weakness (generalized): Secondary | ICD-10-CM | POA: Diagnosis not present

## 2023-12-24 DIAGNOSIS — R2689 Other abnormalities of gait and mobility: Secondary | ICD-10-CM | POA: Diagnosis not present

## 2023-12-24 DIAGNOSIS — R269 Unspecified abnormalities of gait and mobility: Secondary | ICD-10-CM

## 2023-12-24 DIAGNOSIS — H903 Sensorineural hearing loss, bilateral: Secondary | ICD-10-CM | POA: Diagnosis not present

## 2023-12-24 DIAGNOSIS — M5459 Other low back pain: Secondary | ICD-10-CM | POA: Diagnosis not present

## 2023-12-24 DIAGNOSIS — R2681 Unsteadiness on feet: Secondary | ICD-10-CM | POA: Diagnosis not present

## 2023-12-24 DIAGNOSIS — R42 Dizziness and giddiness: Secondary | ICD-10-CM | POA: Diagnosis not present

## 2023-12-24 DIAGNOSIS — R262 Difficulty in walking, not elsewhere classified: Secondary | ICD-10-CM

## 2023-12-24 NOTE — Therapy (Signed)
 OUTPATIENT PHYSICAL THERAPY NEURO TREATMENT   Patient Name: Johnathan Arnold. MRN: [id number] DOB:12/07/1945, 78 y.o., male Today's Date: 12/24/2023  PCP: Marylynn Verneita CROME, MD  REFERRING PROVIDER: Marylynn Verneita CROME, MD   END OF SESSION:  PT End of Session - 12/24/23 1059     Visit Number 27    Number of Visits 40    Date for PT Re-Evaluation 01/07/24    Progress Note Due on Visit 30    PT Start Time 1102    PT Stop Time 1144    PT Time Calculation (min) 42 min    Equipment Utilized During Treatment Gait belt    Activity Tolerance Patient tolerated treatment well    Behavior During Therapy [facility] for tasks assessed/performed                        Past Medical History:  Diagnosis Date   3-vessel coronary artery disease    s/p  5 vessel CABG   Allergy June 2024   Diabetes mellitus without complication (HCC)    History of cardiac catheterization 2011   [facility]   Hyperlipidemia    Hypertension    Hypertriglyceridemia    Neuromuscular disorder [facility]) October 2017   Diagonis Parkinson   Parkinson's disease [facility])    Pneumonia 12/28/2020   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 LIMA to LAD, SVG to D1, OM1 and RPDA   CORONARY ARTERY BYPASS GRAFT  03/1998   5 vessel, [facility]   RIGHT HEART CATH N/A 04/04/2019   Procedure: RIGHT HEART CATH;  Surgeon: Darron Deatrice LABOR, MD;  Location: [facility] INVASIVE CV LAB;  Service: Cardiovascular;  Laterality: N/A;   RIGHT/LEFT HEART CATH AND CORONARY ANGIOGRAPHY N/A 02/01/2018   Procedure: RIGHT/LEFT HEART CATH AND CORONARY ANGIOGRAPHY;  Surgeon: Darron Deatrice LABOR, MD;  Location: [facility] INVASIVE CV LAB;  Service: Cardiovascular;  Laterality: N/A;   Patient Active Problem List   Diagnosis Date Noted   Diverticulitis 07/26/2023   RSV (respiratory syncytial virus pneumonia) 07/26/2023   Impaired ambulation 07/02/2023    Orthostatic hypotension 05/22/2023   Generalized weakness 05/07/2021   Bradycardia    Anemia, unspecified 02/07/2021   Neutropenia (HCC) 01/29/2021   Thrombocytopenia (HCC) 01/29/2021   Right lower lobe pneumonia 12/28/2020   Hyponatremia 12/22/2020   Bilateral leg weakness 12/20/2020   Prostate cancer screening 09/08/2020   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) 09/06/2020   Low back pain 08/19/2019   Pulmonary hypertension (HCC)    Insomnia 12/21/2018   Sleep apnea in adult 12/09/2018   Periodic limb movement disorder 12/09/2018   Pulmonary nodules 05/18/2018   Wears hearing aid in both ears 05/17/2018   Leg pain, bilateral 02/20/2018   Dyspnea    CKD stage 3a, GFR 45-59 ml/min (HCC) 09/21/2017   History of skin cancer in adulthood 08/14/2016   Parkinson's disease (HCC) 08/09/2016   Bilateral carotid artery stenosis 04/02/2015   Vertigo, peripheral 10/17/2014   Benign prostatic hyperplasia with urinary frequency 01/31/2014   Obesity 04/03/2013   Other malaise and fatigue 09/21/2012   Hyperlipidemia    Essential hypertension    3-vessel coronary artery disease    S/P CABG x 5     ONSET DATE: 07/03/23  REFERRING DIAG: M70.101 (ICD-10-CM) - Weakness of both lower extremities   THERAPY DIAG:  No  diagnosis found.  Rationale for Evaluation and Treatment: Rehabilitation  SUBJECTIVE:                                                                                                                                                                                             SUBJECTIVE STATEMENT:  Pt reports feeling better since his fall and clavicular fracture. Not having pain in his shoulder at the moment but still has pain in area of fracture at times. Pt is out lf sling during day but still wearing it to sleep.    Pt accompanied by: significant other  PERTINENT HISTORY:  From prior PT evaluation:  PD, history of COVID and significant weakness  present following this diagnosis and the use of paxlovid ( August 5 dx date, thinks Paxlovid may have exacerbated PD symptoms)    Pt reports having COVID and having significant weakness.  Patient reports he also has back pain that is not present for a long time but was exacerbated more recently.  Patient previously did physical therapy for his back and experienced some relief but has not been consistent with the exercises.  Patient also has history of going to Parkinson's rock steady classes multiple times per week prior to onset of his COVID but he has not been back since. Patient previously ambulated without an assistive device but is now ambulating with a straight point cane.  Patient reports increased foot and ankle weakness in comparison with his hips and knees.  Patient also reports low back pain that is exacerbated with prolonged standing.  PAIN:  Are you having pain? No  PRECAUTIONS: Fall  RED FLAGS: None   WEIGHT BEARING RESTRICTIONS: No  FALLS: Has patient fallen in last 6 months? Yes. Number of falls 6  LIVING ENVIRONMENT: Lives with: lives with their spouse Lives in: House/apartment Stairs: Yes: Internal: 15 steps; on left going up and External: 1 steps; none Has following equipment at home: Quad cane large base and Walker - 2 wheeled  PLOF: Independent  PATIENT GOALS: to improve overall strength, get back to walking more regularly  OBJECTIVE:  Note: Objective measures were completed at Evaluation unless otherwise noted.  DIAGNOSTIC FINDINGS:   EXAM: CT HEAD WITHOUT CONTRAST  IMPRESSION: 1. No acute intracranial process. 2. Mild chronic small vessel ischemic changes. 3. Acute right frontal and maxillary sinusitis.  COGNITION: Overall cognitive status: Within functional limits for tasks assessed   SENSATION: WFL  COORDINATION: WFL    LOWER EXTREMITY ROM:     Active  Right Eval Left Eval  Hip flexion    Hip extension    Hip abduction  Hip  adduction    Hip internal rotation    Hip external rotation    Knee flexion    Knee extension    Ankle dorsiflexion    Ankle plantarflexion    Ankle inversion    Ankle eversion     (Blank rows = not tested)  LOWER EXTREMITY MMT:    MMT Right Eval Left Eval  Hip flexion 4 4  Hip abduction 4 4  Hip adduction 4 4  Knee flexion 3+ 3+  Knee extension 4- 4-  Ankle dorsiflexion 4+ 4  (Blank rows = not tested)  BED MOBILITY:  No limitations according to the pt.  TRANSFERS: Assistive device utilized: None  Sit to stand: Complete Independence Stand to sit: Complete Independence Chair to chair: Complete Independence Floor: Not tested  FUNCTIONAL TESTS:  5 times sit to stand: 15.03 sec Timed up and go (TUG): 13.38 sec 6 minute walk test: TBD 10 meter walk test: 12.15 sec; 0.82 m/s Dynamic Gait Index: TBD  PATIENT SURVEYS:  ABC scale 70.6%                                                                                                                              TREATMENT DATE: 12/24/23   TE- To improve strength, endurance, mobility, and function of specific targeted muscle groups or improve joint range of motion or improve muscle flexibility  Nustep level 3 for B UE and LE reciprocal movemnt training. Careful placement of RLE and special instruction for AAROM on R UE and frequent checking in for pain/ discomfort, none noted throughout x 6 min   Seated LAQ 2 x 10 ea LE with 3# AW   NMR: To facilitate reeducation of movement, balance, posture, coordination, and/or proprioception/kinesthetic sense. Seated PWR! Up 2 x 10, second round focus on increasing speed  Seated PWR! Step with 3# AW 2 x 10 ea LE   Gait with 3# AW x 160 ft - cues for foot clearance  Seated LAQ x 12 ea with 3# AW  Gait with 3# AW x 160 ft, cues for large steps and increased cadence, good to start but slows throughout Seated LAQ x 12 ea with 3# AW  Gait with 3# AW x 160 ft, cues for large steps and  increased cadence, good to start but slows throughout  Standing PWR! Step at support bar with 3# AW 2 x 10 ea LE   Unless otherwise stated, CGA was provided and gait belt donned in order to ensure pt safety  PWR! Up targets postural strengthening and antigravity extension, PRW! Rock targets functional weight shifting, PRW! Twist targets trunk rotation and PRW! Step targets transition movements. PWR! Moves target bradykinesia, rigidity, and dyskinesia through targeted functional movements that address four core movement difficulties for people with Parkinson's disease.   Pt instructed in importance of resting between days of heavy LE strength training to prevent legs feeling excessively weak and tired. Gave examples of  appropriate schedules for rock steady and PT.   PATIENT EDUCATION: Education details: Pt educated throughout session about proper posture and technique with exercises. Improved exercise technique, movement at target joints, use of target muscles after min to mod verbal, visual, tactile cues  Person educated: Patient and Spouse Education method: Explanation, Demonstration, and Verbal cues Education comprehension: verbalized understanding  HOME EXERCISE PROGRAM: Seated PWR! Moves x 10 ea, handout provided   GOALS: Goals reviewed with patient? Yes  SHORT TERM GOALS: Target date: 09/14/2023    Pt will be independent with HEP in order to demonstrate increased ability to perform tasks related to occupation/hobbies. Baseline:  Pt to be given HEP at next visit. Goal status: INITIAL  LONG TERM GOALS: Target date: 11/09/2023  1.  Patient (> 29 years old) will complete five times sit to stand test in < 15 seconds indicating an increased LE strength and improved balance. Baseline: 15.03 sec 4/14: 11.79 sec  5/29: 11.06 sec Goal status: MET  2.  Patient will increase their ABC scale score to be 80.6% (an increase of 10 points), demonstrating improved balance confidence and reduced  fall risk, as measured through weekly assessments. Baseline: 70.6% 4/14:72 % 5/29: 74% Goal status: INITIAL   3.  Patient will increase Berg Balance score by > 6 points to demonstrate decreased fall risk during functional activities. Baseline: 43 on 3/13 4/14: 49 5/29: 56 Goal status: MET   4.  Patient will reduce timed up and go to <11 seconds to reduce fall risk and demonstrate improved transfer/gait ability. Baseline: 13.38 sec 4/14: 11.4 sec no AD 5/29: 10.78 sec Goal status: MET  5.  Patient will increase 10 meter walk test to >1.77m/s as to improve gait speed for better community ambulation and to reduce fall risk. Baseline: 12.15 sec; 0.82 m/s 5/29: 1.06 m/s Goal status: MET  6.  Patient will increase six minute walk test distance to >1000 for progression to community ambulator and improve gait ability Baseline: 459 ft with RW stopping at 5:30 due to fatigue  09/21/23:1128 ft with 4WW  Goal status: MET  7.  Patient will increased Minibest by at least 3 points  to reduce fall risk and demonstrate improved transfer/gait ability. Baseline: 11/09/23: 22 Goal status: initial.   8.  Patient will be independent with gym-based exercise program that he can complete 2-3 times weekly to build his lower extremity strength and postural strength Baseline: no gym program, wants to continue to progress leg strength  Goal status: Initial    ASSESSMENT:  CLINICAL IMPRESSION:  Patient arrived with good motivation for completion of pt activities. Pt completes lower extremity strength, and PD specific movement training with min UE use throughout session. PT was very observant of any shoulder stress or pain and no activities caused any discomfort this date. Will continue to progress pt LE strength and mitigate R UE stress, at least until next MD visit. Pt progressed well today but still quick to fatigue. Pt will continue to benefit from skilled physical therapy intervention to address impairments,  improve QOL, and attain therapy goals.     OBJECTIVE IMPAIRMENTS: Abnormal gait, decreased activity tolerance, decreased balance, decreased endurance, decreased knowledge of use of DME, decreased mobility, difficulty walking, decreased ROM, decreased strength, and decreased safety awareness.   ACTIVITY LIMITATIONS: carrying, lifting, bending, standing, squatting, bathing, and locomotion level  PARTICIPATION LIMITATIONS: cleaning, laundry, driving, shopping, community activity, and yard work  PERSONAL FACTORS: Age, Education, Past/current experiences, Time since onset of injury/illness/exacerbation, and  3+ comorbidities: orthostatic HTN, syncope, falls, anemia, insomnia, dyspnea are also affecting patient's functional outcome.   REHAB POTENTIAL: Fair pt has been seen in the past.  CLINICAL DECISION MAKING: Evolving/moderate complexity  EVALUATION COMPLEXITY: Moderate  PLAN:  PT FREQUENCY: 2x/week  PT DURATION: 8 weeks  PLANNED INTERVENTIONS: 97110-Therapeutic exercises, 97530- Therapeutic activity, 97112- Neuromuscular re-education, 97535- Self Care, [id number]- Manual therapy, [phone]- Gait training, Balance training, Stair training, and Vestibular training  PLAN FOR NEXT SESSION:   Balance and strength interventions PD specific interventions as indicated   Note: Portions of this document were prepared using Dragon voice recognition software and although reviewed may contain unintentional dictation errors in syntax, grammar, or spelling.  Lonni KATHEE Gainer PT ,DPT Physical Therapist- [facility]   12/24/23, 11:00 AM

## 2023-12-28 ENCOUNTER — Ambulatory Visit

## 2023-12-28 ENCOUNTER — Ambulatory Visit: Payer: Medicare Other | Admitting: Physical Therapy

## 2023-12-28 DIAGNOSIS — S42034D Nondisplaced fracture of lateral end of right clavicle, subsequent encounter for fracture with routine healing: Secondary | ICD-10-CM | POA: Diagnosis not present

## 2023-12-29 ENCOUNTER — Ambulatory Visit: Admitting: Internal Medicine

## 2023-12-31 ENCOUNTER — Ambulatory Visit: Payer: Medicare Other | Admitting: Physical Therapy

## 2023-12-31 ENCOUNTER — Encounter: Payer: Self-pay | Admitting: Physical Therapy

## 2023-12-31 ENCOUNTER — Encounter

## 2023-12-31 ENCOUNTER — Telehealth: Payer: Self-pay | Admitting: Cardiovascular Disease

## 2023-12-31 DIAGNOSIS — R2681 Unsteadiness on feet: Secondary | ICD-10-CM

## 2023-12-31 DIAGNOSIS — R29898 Other symptoms and signs involving the musculoskeletal system: Secondary | ICD-10-CM

## 2023-12-31 DIAGNOSIS — R262 Difficulty in walking, not elsewhere classified: Secondary | ICD-10-CM | POA: Diagnosis not present

## 2023-12-31 DIAGNOSIS — M6281 Muscle weakness (generalized): Secondary | ICD-10-CM

## 2023-12-31 DIAGNOSIS — R269 Unspecified abnormalities of gait and mobility: Secondary | ICD-10-CM

## 2023-12-31 DIAGNOSIS — R2689 Other abnormalities of gait and mobility: Secondary | ICD-10-CM | POA: Diagnosis not present

## 2023-12-31 DIAGNOSIS — M5459 Other low back pain: Secondary | ICD-10-CM

## 2023-12-31 NOTE — Telephone Encounter (Signed)
 Patient dropped off blood pressure log as requested by Bernardino Bring, PA. Placed in Bernardino Bring PA's nurse box

## 2023-12-31 NOTE — Therapy (Signed)
 OUTPATIENT PHYSICAL THERAPY NEURO TREATMENT   Patient Name: Johnathan Arnold. MRN: [id number] DOB:29-Mar-1946, 78 y.o., male Today's Date: 12/31/2023  PCP: Marylynn Verneita CROME, MD  REFERRING PROVIDER: Marylynn Verneita CROME, MD   END OF SESSION:  PT End of Session - 12/31/23 1101     Visit Number 28    Number of Visits 40    Date for PT Re-Evaluation 01/07/24    Progress Note Due on Visit 30    PT Start Time 1104    PT Stop Time 1145    PT Time Calculation (min) 41 min    Equipment Utilized During Treatment Gait belt    Activity Tolerance Patient tolerated treatment well    Behavior During Therapy [facility] for tasks assessed/performed                        Past Medical History:  Diagnosis Date   3-vessel coronary artery disease    s/p  5 vessel CABG   Allergy June 2024   Diabetes mellitus without complication (HCC)    History of cardiac catheterization 2011   [facility]   Hyperlipidemia    Hypertension    Hypertriglyceridemia    Neuromuscular disorder [facility]) October 2017   Diagonis Parkinson   Parkinson's disease [facility])    Pneumonia 12/28/2020   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 LIMA to LAD, SVG to D1, OM1 and RPDA   CORONARY ARTERY BYPASS GRAFT  03/1998   5 vessel, [employer]   RIGHT HEART CATH N/A 04/04/2019   Procedure: RIGHT HEART CATH;  Surgeon: Darron Deatrice LABOR, MD;  Location: [facility] INVASIVE CV LAB;  Service: Cardiovascular;  Laterality: N/A;   RIGHT/LEFT HEART CATH AND CORONARY ANGIOGRAPHY N/A 02/01/2018   Procedure: RIGHT/LEFT HEART CATH AND CORONARY ANGIOGRAPHY;  Surgeon: Darron Deatrice LABOR, MD;  Location: [facility] INVASIVE CV LAB;  Service: Cardiovascular;  Laterality: N/A;   Patient Active Problem List   Diagnosis Date Noted   Diverticulitis 07/26/2023   RSV (respiratory syncytial virus pneumonia) 07/26/2023   Impaired ambulation 07/02/2023    Orthostatic hypotension 05/22/2023   Generalized weakness 05/07/2021   Bradycardia    Anemia, unspecified 02/07/2021   Neutropenia (HCC) 01/29/2021   Thrombocytopenia (HCC) 01/29/2021   Right lower lobe pneumonia 12/28/2020   Hyponatremia 12/22/2020   Bilateral leg weakness 12/20/2020   Prostate cancer screening 09/08/2020   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) 09/06/2020   Low back pain 08/19/2019   Pulmonary hypertension (HCC)    Insomnia 12/21/2018   Sleep apnea in adult 12/09/2018   Periodic limb movement disorder 12/09/2018   Pulmonary nodules 05/18/2018   Wears hearing aid in both ears 05/17/2018   Leg pain, bilateral 02/20/2018   Dyspnea    CKD stage 3a, GFR 45-59 ml/min (HCC) 09/21/2017   History of skin cancer in adulthood 08/14/2016   Parkinson's disease (HCC) 08/09/2016   Bilateral carotid artery stenosis 04/02/2015   Vertigo, peripheral 10/17/2014   Benign prostatic hyperplasia with urinary frequency 01/31/2014   Obesity 04/03/2013   Other malaise and fatigue 09/21/2012   Hyperlipidemia    Essential hypertension    3-vessel coronary artery disease    S/P CABG x 5     ONSET DATE: 07/03/23  REFERRING DIAG: M70.101 (ICD-10-CM) - Weakness of both lower extremities   THERAPY DIAG:  Unsteadiness  on feet  Abnormality of gait and mobility  Difficulty in walking, not elsewhere classified  Other abnormalities of gait and mobility  Muscle weakness (generalized)  Other low back pain  Weakness of both hips  Rationale for Evaluation and Treatment: Rehabilitation  SUBJECTIVE:                                                                                                                                                                                             SUBJECTIVE STATEMENT:  Pt reports feeling better since his fall and clavicular fracture. Has not fallen since. Had a f/u appt Monday and doc said his fx was healing good.   Pt  accompanied by: significant other  PERTINENT HISTORY:  From prior PT evaluation:  PD, history of COVID and significant weakness present following this diagnosis and the use of paxlovid ( August 5 dx date, thinks Paxlovid may have exacerbated PD symptoms)    Pt reports having COVID and having significant weakness.  Patient reports he also has back pain that is not present for a long time but was exacerbated more recently.  Patient previously did physical therapy for his back and experienced some relief but has not been consistent with the exercises.  Patient also has history of going to Parkinson's rock steady classes multiple times per week prior to onset of his COVID but he has not been back since. Patient previously ambulated without an assistive device but is now ambulating with a straight point cane.  Patient reports increased foot and ankle weakness in comparison with his hips and knees.  Patient also reports low back pain that is exacerbated with prolonged standing.  PAIN:  Are you having pain? No  PRECAUTIONS: Fall  RED FLAGS: None   WEIGHT BEARING RESTRICTIONS: No  FALLS: Has patient fallen in last 6 months? Yes. Number of falls 6  LIVING ENVIRONMENT: Lives with: lives with their spouse Lives in: House/apartment Stairs: Yes: Internal: 15 steps; on left going up and External: 1 steps; none Has following equipment at home: Quad cane large base and Walker - 2 wheeled  PLOF: Independent  PATIENT GOALS: to improve overall strength, get back to walking more regularly  OBJECTIVE:  Note: Objective measures were completed at Evaluation unless otherwise noted.  DIAGNOSTIC FINDINGS:   EXAM: CT HEAD WITHOUT CONTRAST  IMPRESSION: 1. No acute intracranial process. 2. Mild chronic small vessel ischemic changes. 3. Acute right frontal and maxillary sinusitis.  COGNITION: Overall cognitive status: Within functional limits for tasks  assessed   SENSATION: WFL  COORDINATION: WFL    LOWER EXTREMITY ROM:     Active  Right Eval Left Eval  Hip flexion    Hip extension    Hip abduction    Hip adduction    Hip internal rotation    Hip external rotation    Knee flexion    Knee extension    Ankle dorsiflexion    Ankle plantarflexion    Ankle inversion    Ankle eversion     (Blank rows = not tested)  LOWER EXTREMITY MMT:    MMT Right Eval Left Eval  Hip flexion 4 4  Hip abduction 4 4  Hip adduction 4 4  Knee flexion 3+ 3+  Knee extension 4- 4-  Ankle dorsiflexion 4+ 4  (Blank rows = not tested)  BED MOBILITY:  No limitations according to the pt.  TRANSFERS: Assistive device utilized: None  Sit to stand: Complete Independence Stand to sit: Complete Independence Chair to chair: Complete Independence Floor: Not tested  FUNCTIONAL TESTS:  5 times sit to stand: 15.03 sec Timed up and go (TUG): 13.38 sec 6 minute walk test: TBD 10 meter walk test: 12.15 sec; 0.82 m/s Dynamic Gait Index: TBD  PATIENT SURVEYS:  ABC scale 70.6%                                                                                                                              TREATMENT DATE: 12/31/23   TE- To improve strength, endurance, mobility, and function of specific targeted muscle groups or improve joint range of motion or improve muscle flexibility  Seated hip ABD 2x12 BTB  Seated LAQ 2 x 10 ea LE with 3# AW   Standing marches 3x30 3# AW at safetybar B UE support  NMR: To facilitate reeducation of movement, balance, posture, coordination, and/or proprioception/kinesthetic sense. Seated PWR! Up 2 x 10, second round focus on increasing speed  Seated PWR! Step with 3# AW 2 x 10 ea LE   Gait training (circuit)  Gait 3# AW 300 ft LAQ 3# AW 1x10 BLE  Gait 3#AW 300 ft  Unless otherwise stated, CGA was provided and gait belt donned in order to ensure pt safety  PWR! Up targets postural strengthening and  antigravity extension, PRW! Rock targets functional weight shifting, PRW! Twist targets trunk rotation and PRW! Step targets transition movements. PWR! Moves target bradykinesia, rigidity, and dyskinesia through targeted functional movements that address four core movement difficulties for people with Parkinson's disease.   Unless otherwise stated, CGA was provided and gait belt donned in order to ensure pt safety   PATIENT EDUCATION: Education details: Pt educated throughout session about proper posture and technique with exercises. Improved exercise technique, movement at target joints, use of target muscles after min to mod verbal, visual, tactile cues  Person educated: Patient and Spouse Education method: Explanation, Demonstration, and Verbal cues Education comprehension: verbalized understanding  HOME EXERCISE PROGRAM: Seated PWR! Moves x 10 ea, handout provided   GOALS: Goals reviewed with patient? Yes  SHORT TERM GOALS: Target  date: 09/14/2023    Pt will be independent with HEP in order to demonstrate increased ability to perform tasks related to occupation/hobbies. Baseline:  Pt to be given HEP at next visit. Goal status: INITIAL  LONG TERM GOALS: Target date: 11/09/2023  1.  Patient (> 79 years old) will complete five times sit to stand test in < 15 seconds indicating an increased LE strength and improved balance. Baseline: 15.03 sec 4/14: 11.79 sec  5/29: 11.06 sec Goal status: MET  2.  Patient will increase their ABC scale score to be 80.6% (an increase of 10 points), demonstrating improved balance confidence and reduced fall risk, as measured through weekly assessments. Baseline: 70.6% 4/14:72 % 5/29: 74% Goal status: INITIAL   3.  Patient will increase Berg Balance score by > 6 points to demonstrate decreased fall risk during functional activities. Baseline: 43 on 3/13 4/14: 49 5/29: 56 Goal status: MET   4.  Patient will reduce timed up and go to <11 seconds to  reduce fall risk and demonstrate improved transfer/gait ability. Baseline: 13.38 sec 4/14: 11.4 sec no AD 5/29: 10.78 sec Goal status: MET  5.  Patient will increase 10 meter walk test to >1.38m/s as to improve gait speed for better community ambulation and to reduce fall risk. Baseline: 12.15 sec; 0.82 m/s 5/29: 1.06 m/s Goal status: MET  6.  Patient will increase six minute walk test distance to >1000 for progression to community ambulator and improve gait ability Baseline: 459 ft with RW stopping at 5:30 due to fatigue  09/21/23:1128 ft with 4WW  Goal status: MET  7.  Patient will increased Minibest by at least 3 points  to reduce fall risk and demonstrate improved transfer/gait ability. Baseline: 11/09/23: 22 Goal status: initial.   8.  Patient will be independent with gym-based exercise program that he can complete 2-3 times weekly to build his lower extremity strength and postural strength Baseline: no gym program, wants to continue to progress leg strength  Goal status: Initial    ASSESSMENT:  CLINICAL IMPRESSION:  Patient arrived with good motivation for completion of pt activities. Pt completes lower extremity strength, and PD specific movement training well. With VC to increase speed with PWR up and PWR step. Pt underwent gait training circuit today. He displayed good step length and cadence during gait, however did display minimal arm swing. Interventions to increase arm swing would benefit this pt and likely increase gait speed. Pt will continue to benefit from skilled physical therapy intervention to address impairments, improve QOL, and attain therapy goals.      OBJECTIVE IMPAIRMENTS: Abnormal gait, decreased activity tolerance, decreased balance, decreased endurance, decreased knowledge of use of DME, decreased mobility, difficulty walking, decreased ROM, decreased strength, and decreased safety awareness.   ACTIVITY LIMITATIONS: carrying, lifting, bending, standing,  squatting, bathing, and locomotion level  PARTICIPATION LIMITATIONS: cleaning, laundry, driving, shopping, community activity, and yard work  PERSONAL FACTORS: Age, Education, Past/current experiences, Time since onset of injury/illness/exacerbation, and 3+ comorbidities: orthostatic HTN, syncope, falls, anemia, insomnia, dyspnea are also affecting patient's functional outcome.   REHAB POTENTIAL: Fair pt has been seen in the past.  CLINICAL DECISION MAKING: Evolving/moderate complexity  EVALUATION COMPLEXITY: Moderate  PLAN:  PT FREQUENCY: 2x/week  PT DURATION: 8 weeks  PLANNED INTERVENTIONS: 97110-Therapeutic exercises, 97530- Therapeutic activity, [id number]- Neuromuscular re-education, 97535- Self Care, [id number]- Manual therapy, [phone]- Gait training, Balance training, Stair training, and Vestibular training  PLAN FOR NEXT SESSION:   Balance and strength interventions PD specific  interventions as indicated   Note: Portions of this document were prepared using Dragon voice recognition software and although reviewed may contain unintentional dictation errors in syntax, grammar, or spelling.  Casilda Human, SPT   [facility]   12/31/23, 11:02 AM

## 2024-01-01 ENCOUNTER — Other Ambulatory Visit: Payer: Self-pay | Admitting: *Deleted

## 2024-01-01 MED ORDER — LOSARTAN POTASSIUM 50 MG PO TABS: 50.0000 mg | ORAL_TABLET | Freq: Two times a day (BID) | ORAL | 3 refills | Status: AC

## 2024-01-01 NOTE — Telephone Encounter (Signed)
 Patient was seen in the office on 12/14/2023 and reported his BP in the mornings prior to taking his medications was in the 170s to 190s systolic with improvement in blood pressure following losartan  and Imdur .  Blood pressure was well-controlled in the office at 114/40.  Review of patient's blood pressure log dated 12/17/2023 through 12/28/2023 shows a blood pressure range 106-178/46-83 with a heart rate range of 53 to 74.  Blood pressure readings in the mornings, prior to medications predominantly in the 150s systolic with a rare 130s, 160s or 170s systolic.  BP after meds predominantly in the 130s to 140s systolic with an occasional 150s or 160s systolic.  Current BP regimen includes losartan  50 mg twice daily and Imdur  60 mg daily.  Recommendation: -If he is agreeable, add amlodipine  2.5 mg nightly (this was previously held due to low blood pressure, therefore we will start at a very low dose) -Continue losartan  50 mg bid and Imdur  60 mg daily

## 2024-01-01 NOTE — Telephone Encounter (Signed)
 See separate telephone encounter for recommendations.

## 2024-01-02 ENCOUNTER — Other Ambulatory Visit: Payer: Self-pay | Admitting: Cardiovascular Disease

## 2024-01-04 ENCOUNTER — Ambulatory Visit: Admitting: Physical Therapy

## 2024-01-04 ENCOUNTER — Other Ambulatory Visit: Payer: Self-pay

## 2024-01-04 DIAGNOSIS — R269 Unspecified abnormalities of gait and mobility: Secondary | ICD-10-CM | POA: Diagnosis not present

## 2024-01-04 DIAGNOSIS — M5459 Other low back pain: Secondary | ICD-10-CM | POA: Diagnosis not present

## 2024-01-04 DIAGNOSIS — R262 Difficulty in walking, not elsewhere classified: Secondary | ICD-10-CM | POA: Diagnosis not present

## 2024-01-04 DIAGNOSIS — R2681 Unsteadiness on feet: Secondary | ICD-10-CM | POA: Diagnosis not present

## 2024-01-04 DIAGNOSIS — M6281 Muscle weakness (generalized): Secondary | ICD-10-CM

## 2024-01-04 DIAGNOSIS — R29898 Other symptoms and signs involving the musculoskeletal system: Secondary | ICD-10-CM

## 2024-01-04 DIAGNOSIS — R2689 Other abnormalities of gait and mobility: Secondary | ICD-10-CM

## 2024-01-04 MED ORDER — AMLODIPINE BESYLATE 2.5 MG PO TABS: 2.5000 mg | ORAL_TABLET | Freq: Every day | ORAL | 3 refills | Status: DC

## 2024-01-04 MED ORDER — ISOSORBIDE MONONITRATE ER 60 MG PO TB24: 60.0000 mg | ORAL_TABLET | Freq: Every day | ORAL | 3 refills | Status: AC

## 2024-01-04 MED ORDER — AMLODIPINE BESYLATE 2.5 MG PO TABS: 2.5000 mg | ORAL_TABLET | Freq: Every evening | ORAL | 3 refills | Status: DC

## 2024-01-04 NOTE — Telephone Encounter (Signed)
 The patient has been notified (per D[facility] MSG left) of the recommendations - encouraged to call if any concerns or questions arise  Med added and [facility] MSG sent

## 2024-01-04 NOTE — Therapy (Signed)
 OUTPATIENT PHYSICAL THERAPY NEURO TREATMENT   Patient Name: Johnathan Arnold. MRN: [id number] DOB:January 18, 1946, 78 y.o., male Today's Date: 01/04/2024  PCP: Marylynn Verneita CROME, MD  REFERRING PROVIDER: Marylynn Verneita CROME, MD   END OF SESSION:  PT End of Session - 01/04/24 1158     Visit Number 29    Number of Visits 40    Date for PT Re-Evaluation 01/07/24    Progress Note Due on Visit 30    PT Start Time 1151    PT Stop Time 1230    PT Time Calculation (min) 39 min    Equipment Utilized During Treatment Gait belt    Activity Tolerance Patient tolerated treatment well    Behavior During Therapy [facility] for tasks assessed/performed                        Past Medical History:  Diagnosis Date   3-vessel coronary artery disease    s/p  5 vessel CABG   Allergy June 2024   Diabetes mellitus without complication (HCC)    History of cardiac catheterization 2011   [facility]   Hyperlipidemia    Hypertension    Hypertriglyceridemia    Neuromuscular disorder [facility]) October 2017   Diagonis Parkinson   Parkinson's disease [facility])    Pneumonia 12/28/2020   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 LIMA to LAD, SVG to D1, OM1 and RPDA   CORONARY ARTERY BYPASS GRAFT  03/1998   5 vessel, [facility]   RIGHT HEART CATH N/A 04/04/2019   Procedure: RIGHT HEART CATH;  Surgeon: Darron Deatrice LABOR, MD;  Location: [facility] INVASIVE CV LAB;  Service: Cardiovascular;  Laterality: N/A;   RIGHT/LEFT HEART CATH AND CORONARY ANGIOGRAPHY N/A 02/01/2018   Procedure: RIGHT/LEFT HEART CATH AND CORONARY ANGIOGRAPHY;  Surgeon: Darron Deatrice LABOR, MD;  Location: [facility] INVASIVE CV LAB;  Service: Cardiovascular;  Laterality: N/A;   Patient Active Problem List   Diagnosis Date Noted   Diverticulitis 07/26/2023   RSV (respiratory syncytial virus pneumonia) 07/26/2023   Impaired ambulation 07/02/2023    Orthostatic hypotension 05/22/2023   Generalized weakness 05/07/2021   Bradycardia    Anemia, unspecified 02/07/2021   Neutropenia (HCC) 01/29/2021   Thrombocytopenia (HCC) 01/29/2021   Right lower lobe pneumonia 12/28/2020   Hyponatremia 12/22/2020   Bilateral leg weakness 12/20/2020   Prostate cancer screening 09/08/2020   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) 09/06/2020   Low back pain 08/19/2019   Pulmonary hypertension (HCC)    Insomnia 12/21/2018   Sleep apnea in adult 12/09/2018   Periodic limb movement disorder 12/09/2018   Pulmonary nodules 05/18/2018   Wears hearing aid in both ears 05/17/2018   Leg pain, bilateral 02/20/2018   Dyspnea    CKD stage 3a, GFR 45-59 ml/min (HCC) 09/21/2017   History of skin cancer in adulthood 08/14/2016   Parkinson's disease (HCC) 08/09/2016   Bilateral carotid artery stenosis 04/02/2015   Vertigo, peripheral 10/17/2014   Benign prostatic hyperplasia with urinary frequency 01/31/2014   Obesity 04/03/2013   Other malaise and fatigue 09/21/2012   Hyperlipidemia    Essential hypertension    3-vessel coronary artery disease    S/P CABG x 5     ONSET DATE: 07/03/23  REFERRING DIAG: M70.101 (ICD-10-CM) - Weakness of both lower extremities   THERAPY DIAG:  Unsteadiness  on feet  Abnormality of gait and mobility  Difficulty in walking, not elsewhere classified  Other abnormalities of gait and mobility  Muscle weakness (generalized)  Other low back pain  Weakness of both hips  Rationale for Evaluation and Treatment: Rehabilitation  SUBJECTIVE:                                                                                                                                                                                             SUBJECTIVE STATEMENT:  Pt reports feeling better since his fall and clavicular fracture. Has not fallen since. Had a f/u appt Monday and doc said his fx was healing good.   Pt  accompanied by: significant other  PERTINENT HISTORY:  From prior PT evaluation:  PD, history of COVID and significant weakness present following this diagnosis and the use of paxlovid ( August 5 dx date, thinks Paxlovid may have exacerbated PD symptoms)    Pt reports having COVID and having significant weakness.  Patient reports he also has back pain that is not present for a long time but was exacerbated more recently.  Patient previously did physical therapy for his back and experienced some relief but has not been consistent with the exercises.  Patient also has history of going to Parkinson's rock steady classes multiple times per week prior to onset of his COVID but he has not been back since. Patient previously ambulated without an assistive device but is now ambulating with a straight point cane.  Patient reports increased foot and ankle weakness in comparison with his hips and knees.  Patient also reports low back pain that is exacerbated with prolonged standing.  PAIN:  Are you having pain? No  PRECAUTIONS: Fall  RED FLAGS: None   WEIGHT BEARING RESTRICTIONS: No  FALLS: Has patient fallen in last 6 months? Yes. Number of falls 6  LIVING ENVIRONMENT: Lives with: lives with their spouse Lives in: House/apartment Stairs: Yes: Internal: 15 steps; on left going up and External: 1 steps; none Has following equipment at home: Quad cane large base and Walker - 2 wheeled  PLOF: Independent  PATIENT GOALS: to improve overall strength, get back to walking more regularly  OBJECTIVE:  Note: Objective measures were completed at Evaluation unless otherwise noted.  DIAGNOSTIC FINDINGS:   EXAM: CT HEAD WITHOUT CONTRAST  IMPRESSION: 1. No acute intracranial process. 2. Mild chronic small vessel ischemic changes. 3. Acute right frontal and maxillary sinusitis.  COGNITION: Overall cognitive status: Within functional limits for tasks  assessed   SENSATION: WFL  COORDINATION: WFL    LOWER EXTREMITY ROM:     Active  Right Eval Left Eval  Hip flexion    Hip extension    Hip abduction    Hip adduction    Hip internal rotation    Hip external rotation    Knee flexion    Knee extension    Ankle dorsiflexion    Ankle plantarflexion    Ankle inversion    Ankle eversion     (Blank rows = not tested)  LOWER EXTREMITY MMT:    MMT Right Eval Left Eval  Hip flexion 4 4  Hip abduction 4 4  Hip adduction 4 4  Knee flexion 3+ 3+  Knee extension 4- 4-  Ankle dorsiflexion 4+ 4  (Blank rows = not tested)  BED MOBILITY:  No limitations according to the pt.  TRANSFERS: Assistive device utilized: None  Sit to stand: Complete Independence Stand to sit: Complete Independence Chair to chair: Complete Independence Floor: Not tested  FUNCTIONAL TESTS:  5 times sit to stand: 15.03 sec Timed up and go (TUG): 13.38 sec 6 minute walk test: TBD 10 meter walk test: 12.15 sec; 0.82 m/s Dynamic Gait Index: TBD  PATIENT SURVEYS:  ABC scale 70.6%                                                                                                                              TREATMENT DATE: 01/04/24  Nustep reciprocal movement and activity tolerance training for neurological priming x 6 min, level 1-3 cues for SPM >50. Utilized BUE for ~3 min then finished with BLE/LUE for lst 3.   Foot tap on 6 inch step x 12 bil  Step up with reciprocal march x 10 bil  Sit<>stand with UE swing x 10   Standing on airex beam x 30 sec, Lateral step on beam with lateral reach x 10 bil  Trunk rotation on beam to clap contrateral hand with shoulder in 90 deg abduction x 10 bil.   Weighted gait with 3# AW 2x 424ft   Side stepping with 3# AW 52ft x 4 bil  Forward/reverse gait with 3 # AW, 24ft x 4 bil   Mild posterior LOB intermittently on airex beam, but able to to self correct without assist from PT. No reports of shoulder pain  throughout session.   Unless otherwise stated, CGA was provided and gait belt donned in order to ensure pt safety   PATIENT EDUCATION: Education details: Pt educated throughout session about proper posture and technique with exercises. Improved exercise technique, movement at target joints, use of target muscles after min to mod verbal, visual, tactile cues  Person educated: Patient and Spouse Education method: Explanation, Demonstration, and Verbal cues Education comprehension: verbalized understanding  HOME EXERCISE PROGRAM: Seated PWR! Moves x 10 ea, handout provided   GOALS: Goals reviewed with patient? Yes  SHORT TERM GOALS: Target date: 09/14/2023    Pt will be independent with HEP in order to demonstrate increased ability to perform tasks related to occupation/hobbies. Baseline:  Pt to be given HEP at next visit. Goal status: INITIAL  LONG TERM GOALS: Target date: 11/09/2023  1.  Patient (> 94 years old) will complete five times sit to stand test in < 15 seconds indicating an increased LE strength and improved balance. Baseline: 15.03 sec 4/14: 11.79 sec  5/29: 11.06 sec Goal status: MET  2.  Patient will increase their ABC scale score to be 80.6% (an increase of 10 points), demonstrating improved balance confidence and reduced fall risk, as measured through weekly assessments. Baseline: 70.6% 4/14:72 % 5/29: 74% Goal status: INITIAL   3.  Patient will increase Berg Balance score by > 6 points to demonstrate decreased fall risk during functional activities. Baseline: 43 on 3/13 4/14: 49 5/29: 56 Goal status: MET   4.  Patient will reduce timed up and go to <11 seconds to reduce fall risk and demonstrate improved transfer/gait ability. Baseline: 13.38 sec 4/14: 11.4 sec no AD 5/29: 10.78 sec Goal status: MET  5.  Patient will increase 10 meter walk test to >1.21m/s as to improve gait speed for better community ambulation and to reduce fall risk. Baseline: 12.15 sec; 0.82  m/s 5/29: 1.06 m/s Goal status: MET  6.  Patient will increase six minute walk test distance to >1000 for progression to community ambulator and improve gait ability Baseline: 459 ft with RW stopping at 5:30 due to fatigue  09/21/23:1128 ft with 4WW  Goal status: MET  7.  Patient will increased Minibest by at least 3 points  to reduce fall risk and demonstrate improved transfer/gait ability. Baseline: 11/09/23: 22 Goal status: initial.   8.  Patient will be independent with gym-based exercise program that he can complete 2-3 times weekly to build his lower extremity strength and postural strength Baseline: no gym program, wants to continue to progress leg strength  Goal status: Initial    ASSESSMENT:  CLINICAL IMPRESSION:  Patient arrived with good motivation for completion of pt activities. PT treatment focused on PD specific movement training well. Improved arm swing noted in weighted gait on this day with no significant tremors with PD targeted movements. Occasional mild posterior LOB on airex beam, but able to self-correct without additional assist from PT on this day.  Pt will continue to benefit from skilled physical therapy intervention to address impairments, improve QOL, and attain therapy goals.      OBJECTIVE IMPAIRMENTS: Abnormal gait, decreased activity tolerance, decreased balance, decreased endurance, decreased knowledge of use of DME, decreased mobility, difficulty walking, decreased ROM, decreased strength, and decreased safety awareness.   ACTIVITY LIMITATIONS: carrying, lifting, bending, standing, squatting, bathing, and locomotion level  PARTICIPATION LIMITATIONS: cleaning, laundry, driving, shopping, community activity, and yard work  PERSONAL FACTORS: Age, Education, Past/current experiences, Time since onset of injury/illness/exacerbation, and 3+ comorbidities: orthostatic HTN, syncope, falls, anemia, insomnia, dyspnea are also affecting patient's functional  outcome.   REHAB POTENTIAL: Fair pt has been seen in the past.  CLINICAL DECISION MAKING: Evolving/moderate complexity  EVALUATION COMPLEXITY: Moderate  PLAN:  PT FREQUENCY: 2x/week  PT DURATION: 8 weeks  PLANNED INTERVENTIONS: 97110-Therapeutic exercises, 97530- Therapeutic activity, 97112- Neuromuscular re-education, 97535- Self Care, [id number]- Manual therapy, [phone]- Gait training, Balance training, Stair training, and Vestibular training  PLAN FOR NEXT SESSION:   Balance and strength interventions PD specific interventions as indicated  Re-cert vs DC?   Massie Dollar PT, DPT  Physical Therapist - [place]  [facility]  12:36 PM 01/04/24

## 2024-01-06 ENCOUNTER — Other Ambulatory Visit: Payer: Self-pay | Admitting: Internal Medicine

## 2024-01-07 ENCOUNTER — Other Ambulatory Visit: Payer: Self-pay | Admitting: Internal Medicine

## 2024-01-07 ENCOUNTER — Ambulatory Visit: Admitting: Physical Therapy

## 2024-01-07 DIAGNOSIS — R269 Unspecified abnormalities of gait and mobility: Secondary | ICD-10-CM

## 2024-01-07 DIAGNOSIS — R2689 Other abnormalities of gait and mobility: Secondary | ICD-10-CM | POA: Diagnosis not present

## 2024-01-07 DIAGNOSIS — R2681 Unsteadiness on feet: Secondary | ICD-10-CM | POA: Diagnosis not present

## 2024-01-07 DIAGNOSIS — R262 Difficulty in walking, not elsewhere classified: Secondary | ICD-10-CM

## 2024-01-07 DIAGNOSIS — M5459 Other low back pain: Secondary | ICD-10-CM | POA: Diagnosis not present

## 2024-01-07 DIAGNOSIS — M6281 Muscle weakness (generalized): Secondary | ICD-10-CM | POA: Diagnosis not present

## 2024-01-07 NOTE — Therapy (Signed)
 OUTPATIENT PHYSICAL THERAPY NEURO TREATMENT/ Physical Therapy Progress Note/ RECERT   Dates of reporting period  11/09/23   to   01/07/24   Patient Name: Johnathan Arnold. MRN: [id number] DOB:1946-04-19, 78 y.o., male Today's Date: 01/07/2024  PCP: Marylynn Verneita CROME, MD  REFERRING PROVIDER: Marylynn Verneita CROME, MD   END OF SESSION:  PT End of Session - 01/07/24 1150     Visit Number 30    Number of Visits 40    Date for PT Re-Evaluation 01/07/24    Progress Note Due on Visit 30    PT Start Time 1147    PT Stop Time 1227    PT Time Calculation (min) 40 min    Equipment Utilized During Treatment Gait belt    Activity Tolerance Patient tolerated treatment well    Behavior During Therapy [facility] for tasks assessed/performed                         Past Medical History:  Diagnosis Date   3-vessel coronary artery disease    s/p  5 vessel CABG   Allergy June 2024   Diabetes mellitus without complication (HCC)    History of cardiac catheterization 2011   [facility]   Hyperlipidemia    Hypertension    Hypertriglyceridemia    Neuromuscular disorder [facility]) October 2017   Diagonis Parkinson   Parkinson's disease [facility])    Pneumonia 12/28/2020   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 LIMA to LAD, SVG to D1, OM1 and RPDA   CORONARY ARTERY BYPASS GRAFT  03/1998   5 vessel, [facility]   RIGHT HEART CATH N/A 04/04/2019   Procedure: RIGHT HEART CATH;  Surgeon: Darron Deatrice LABOR, MD;  Location: [facility] INVASIVE CV LAB;  Service: Cardiovascular;  Laterality: N/A;   RIGHT/LEFT HEART CATH AND CORONARY ANGIOGRAPHY N/A 02/01/2018   Procedure: RIGHT/LEFT HEART CATH AND CORONARY ANGIOGRAPHY;  Surgeon: Darron Deatrice LABOR, MD;  Location: [facility] INVASIVE CV LAB;  Service: Cardiovascular;  Laterality: N/A;   Patient Active Problem List   Diagnosis Date Noted   Diverticulitis 07/26/2023    RSV (respiratory syncytial virus pneumonia) 07/26/2023   Impaired ambulation 07/02/2023   Orthostatic hypotension 05/22/2023   Generalized weakness 05/07/2021   Bradycardia    Anemia, unspecified 02/07/2021   Neutropenia (HCC) 01/29/2021   Thrombocytopenia (HCC) 01/29/2021   Right lower lobe pneumonia 12/28/2020   Hyponatremia 12/22/2020   Bilateral leg weakness 12/20/2020   Prostate cancer screening 09/08/2020   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) 09/06/2020   Low back pain 08/19/2019   Pulmonary hypertension (HCC)    Insomnia 12/21/2018   Sleep apnea in adult 12/09/2018   Periodic limb movement disorder 12/09/2018   Pulmonary nodules 05/18/2018   Wears hearing aid in both ears 05/17/2018   Leg pain, bilateral 02/20/2018   Dyspnea    CKD stage 3a, GFR 45-59 ml/min (HCC) 09/21/2017   History of skin cancer in adulthood 08/14/2016   Parkinson's disease (HCC) 08/09/2016   Bilateral carotid artery stenosis 04/02/2015   Vertigo, peripheral 10/17/2014   Benign prostatic hyperplasia with urinary frequency 01/31/2014   Obesity 04/03/2013   Other malaise and fatigue 09/21/2012   Hyperlipidemia    Essential hypertension    3-vessel coronary artery disease    S/P CABG x 5  ONSET DATE: 07/03/23  REFERRING DIAG: M70.101 (ICD-10-CM) - Weakness of both lower extremities   THERAPY DIAG:  No diagnosis found.  Rationale for Evaluation and Treatment: Rehabilitation  SUBJECTIVE:                                                                                                                                                                                             SUBJECTIVE STATEMENT:  Pt reports going back to rock steady this week and it went well. A little sore and tired but no more than normal.   Pt accompanied by: significant other  PERTINENT HISTORY:  From prior PT evaluation:  PD, history of COVID and significant weakness present following  this diagnosis and the use of paxlovid ( August 5 dx date, thinks Paxlovid may have exacerbated PD symptoms)    Pt reports having COVID and having significant weakness.  Patient reports he also has back pain that is not present for a long time but was exacerbated more recently.  Patient previously did physical therapy for his back and experienced some relief but has not been consistent with the exercises.  Patient also has history of going to Parkinson's rock steady classes multiple times per week prior to onset of his COVID but he has not been back since. Patient previously ambulated without an assistive device but is now ambulating with a straight point cane.  Patient reports increased foot and ankle weakness in comparison with his hips and knees.  Patient also reports low back pain that is exacerbated with prolonged standing.  PAIN:  Are you having pain? No  PRECAUTIONS: Fall  RED FLAGS: None   WEIGHT BEARING RESTRICTIONS: No  FALLS: Has patient fallen in last 6 months? Yes. Number of falls 6  LIVING ENVIRONMENT: Lives with: lives with their spouse Lives in: House/apartment Stairs: Yes: Internal: 15 steps; on left going up and External: 1 steps; none Has following equipment at home: Quad cane large base and Walker - 2 wheeled  PLOF: Independent  PATIENT GOALS: to improve overall strength, get back to walking more regularly  OBJECTIVE:  Note: Objective measures were completed at Evaluation unless otherwise noted.  DIAGNOSTIC FINDINGS:   EXAM: CT HEAD WITHOUT CONTRAST  IMPRESSION: 1. No acute intracranial process. 2. Mild chronic small vessel ischemic changes. 3. Acute right frontal and maxillary sinusitis.  COGNITION: Overall cognitive status: Within functional limits for tasks assessed   SENSATION: WFL  COORDINATION: WFL    LOWER EXTREMITY ROM:     Active  Right Eval Left Eval  Hip flexion    Hip extension    Hip abduction  Hip adduction    Hip internal  rotation    Hip external rotation    Knee flexion    Knee extension    Ankle dorsiflexion    Ankle plantarflexion    Ankle inversion    Ankle eversion     (Blank rows = not tested)  LOWER EXTREMITY MMT:    MMT Right Eval Left Eval  Hip flexion 4 4  Hip abduction 4 4  Hip adduction 4 4  Knee flexion 3+ 3+  Knee extension 4- 4-  Ankle dorsiflexion 4+ 4  (Blank rows = not tested)  BED MOBILITY:  No limitations according to the pt.  TRANSFERS: Assistive device utilized: None  Sit to stand: Complete Independence Stand to sit: Complete Independence Chair to chair: Complete Independence Floor: Not tested  FUNCTIONAL TESTS:  5 times sit to stand: 15.03 sec Timed up and go (TUG): 13.38 sec 6 minute walk test: TBD 10 meter walk test: 12.15 sec; 0.82 m/s Dynamic Gait Index: TBD  PATIENT SURVEYS:  ABC scale 70.6%                                                                                                                              TREATMENT DATE: 01/07/24 TA- To improve functional movements patterns for everyday tasks ' Octane reciprocal UE and LE movement training x 6 min at level 4   ABC Scale: 90%  PHYSICAL PERFORMANCE   Pt scores 21 / 28 on mini BEST balance test. Scores < 16 indicate increased risk for falls Mayer, Medora, & Maricao) and MCID is 4 (Godi,et al, 2013)    [facility] PT Assessment - 01/07/24 0001       Mini-BESTest   Sit To Stand Normal: Comes to stand without use of hands and stabilizes independently.    Rise to Toes Moderate: Heels up, but not full range (smaller than when holding hands), OR noticeable instability for 3 s.    Stand on one leg (left) Moderate: < 20 s    Stand on one leg (right) Normal: 20 s.    Stand on one leg - lowest score 1    Compensatory Stepping Correction - Forward Normal: Recovers independently with a single, large step (second realignement is allowed).    Compensatory Stepping Correction - Backward No step, OR would  fall if not caught, OR falls spontaneously.    Compensatory Stepping Correction - Left Lateral Normal: Recovers independently with 1 step (crossover or lateral OK)    Compensatory Stepping Correction - Right Lateral Normal: Recovers independently with 1 step (crossover or lateral OK)    Stepping Corredtion Lateral - lowest score 2    Stance - Feet together, eyes open, firm surface  Normal: 30s    Stance - Feet together, eyes closed, foam surface  Normal: 30s    Incline - Eyes Closed Normal: Stands independently 30s and aligns with gravity    Change in Gait Speed Normal: Significantly changes  walkling speed without imbalance    Walk with head turns - Horizontal Moderate: performs head turns with reduction in gait speed.    Walk with pivot turns Normal: Turns with feet close FAST (< 3 steps) with good balance.    Step over obstacles Moderate: Steps over box but touches box OR displays cautious behavior by slowing gait.    Timed UP & GO with Dual Task Moderate: Dual Task affects either counting OR walking (>10%) when compared to the TUG without Dual Task.    Mini-BEST total score 21           Unless otherwise stated, CGA was provided and gait belt donned in order to ensure pt safety  NMR: To facilitate reeducation of movement, balance, posture, coordination, and/or proprioception/kinesthetic sense.  Seated PWR! Moves X 10 with RTB mod of PRW! Up  X 10 each of power rock, twist, and step.  PWR! Up targets postural strengthening and antigravity extension, PRW! Rock targets functional weight shifting, PRW! Twist targets trunk rotation and PRW! Step targets transition movements. PWR! Moves target bradykinesia, rigidity, and dyskinesia through targeted functional movements that address four core movement difficulties for people with Parkinson's disease.   PATIENT EDUCATION: Education details: Pt educated throughout session about proper posture and technique with exercises. Improved exercise  technique, movement at target joints, use of target muscles after min to mod verbal, visual, tactile cues  Person educated: Patient and Spouse Education method: Explanation, Demonstration, and Verbal cues Education comprehension: verbalized understanding  HOME EXERCISE PROGRAM: Seated PWR! Moves x 10 ea, handout provided   GOALS: Goals reviewed with patient? Yes  SHORT TERM GOALS: Target date: 09/14/2023    Pt will be independent with HEP in order to demonstrate increased ability to perform tasks related to occupation/hobbies. Baseline:  Pt to be given HEP at next visit. 7/31: has PWR! Activities, has not completed since fall and shoulder injury   Goal status: ONGOING  LONG TERM GOALS: Target date: 03/03/2024    1.  Patient (> 38 years old) will complete five times sit to stand test in < 15 seconds indicating an increased LE strength and improved balance. Baseline: 15.03 sec 4/14: 11.79 sec  5/29: 11.06 sec Goal status: MET  2.  Patient will increase their ABC scale score to be 80.6% (an increase of 10 points), demonstrating improved balance confidence and reduced fall risk, as measured through weekly assessments. Baseline: 70.6% 4/14:72 % 5/29: 74% 01/07/24: 90% Goal status: ONGOING   3.  Patient will increase Berg Balance score by > 6 points to demonstrate decreased fall risk during functional activities. Baseline: 43 on 3/13 4/14: 49 5/29: 56 Goal status: MET   4.  Patient will reduce timed up and go to <11 seconds to reduce fall risk and demonstrate improved transfer/gait ability. Baseline: 13.38 sec 4/14: 11.4 sec no AD 5/29: 10.78 sec Goal status: MET  5.  Patient will increase 10 meter walk test to >1.54m/s as to improve gait speed for better community ambulation and to reduce fall risk. Baseline: 12.15 sec; 0.82 m/s 5/29: 1.06 m/s Goal status: MET  6.  Patient will increase six minute walk test distance to >1000 for progression to community ambulator and improve gait  ability Baseline: 459 ft with RW stopping at 5:30 due to fatigue  09/21/23:1128 ft with 4WW  Goal status: MET  7.  Patient will increased Minibest by at least 3 points  to reduce fall risk and demonstrate improved transfer/gait ability. Baseline: 11/09/23: 22  7/31:  Goal status: ONGOING.   8.  Patient will be independent with gym-based exercise program that he can complete 2-3 times weekly to build his lower extremity strength and postural strength Baseline: no gym program, wants to continue to progress leg strength  Goal status: ONGOING   ASSESSMENT:  CLINICAL IMPRESSION:  Patient presents for progress note and recertification note this date.  Patient had a fall and subsequent clavicle fracture since last progress note so patient's result this date reflects that somewhat.  Patient so slight progression in many best balance test but overall is still doing fairly well.  Patient's confidence with activities to specific balance scale did not improve somewhat meeting this goal.  Due to patient's fall did not have time to address gym-based program but as patient's clavicle fracture continues to heal and pain levels continue to subside we will progress to gym-based program during this neck certification period.  In addition we will continue to work toward his balance goal of improving his many best balance test in order to improve his overall balance and prevent falls. Patient's condition has the potential to improve in response to therapy. Maximum improvement is yet to be obtained. The anticipated improvement is attainable and reasonable in a generally predictable time. Pt will continue to benefit from skilled physical therapy intervention to address impairments, improve QOL, and attain therapy goals.     OBJECTIVE IMPAIRMENTS: Abnormal gait, decreased activity tolerance, decreased balance, decreased endurance, decreased knowledge of use of DME, decreased mobility, difficulty walking, decreased ROM,  decreased strength, and decreased safety awareness.   ACTIVITY LIMITATIONS: carrying, lifting, bending, standing, squatting, bathing, and locomotion level  PARTICIPATION LIMITATIONS: cleaning, laundry, driving, shopping, community activity, and yard work  PERSONAL FACTORS: Age, Education, Past/current experiences, Time since onset of injury/illness/exacerbation, and 3+ comorbidities: orthostatic HTN, syncope, falls, anemia, insomnia, dyspnea are also affecting patient's functional outcome.   REHAB POTENTIAL: Fair pt has been seen in the past.  CLINICAL DECISION MAKING: Evolving/moderate complexity  EVALUATION COMPLEXITY: Moderate  PLAN:  PT FREQUENCY: 2x/week  PT DURATION: 8 weeks  PLANNED INTERVENTIONS: 97110-Therapeutic exercises, 97530- Therapeutic activity, 97112- Neuromuscular re-education, 97535- Self Care, [id number]- Manual therapy, [phone]- Gait training, Balance training, Stair training, and Vestibular training  PLAN FOR NEXT SESSION:   Balance and strength interventions PD specific interventions as indicated  Progress with gym based program    Note: Portions of this document were prepared using Dragon voice recognition software and although reviewed may contain unintentional dictation errors in syntax, grammar, or spelling.  Lonni KATHEE Gainer PT ,DPT Physical Therapist- [facility]   11:51 AM 01/07/24

## 2024-01-11 ENCOUNTER — Ambulatory Visit: Attending: Internal Medicine | Admitting: Physical Therapy

## 2024-01-11 ENCOUNTER — Encounter

## 2024-01-11 DIAGNOSIS — R262 Difficulty in walking, not elsewhere classified: Secondary | ICD-10-CM | POA: Insufficient documentation

## 2024-01-11 DIAGNOSIS — R2689 Other abnormalities of gait and mobility: Secondary | ICD-10-CM | POA: Insufficient documentation

## 2024-01-11 DIAGNOSIS — M6281 Muscle weakness (generalized): Secondary | ICD-10-CM | POA: Insufficient documentation

## 2024-01-11 DIAGNOSIS — S42034D Nondisplaced fracture of lateral end of right clavicle, subsequent encounter for fracture with routine healing: Secondary | ICD-10-CM | POA: Diagnosis not present

## 2024-01-11 DIAGNOSIS — R269 Unspecified abnormalities of gait and mobility: Secondary | ICD-10-CM | POA: Insufficient documentation

## 2024-01-11 DIAGNOSIS — R2681 Unsteadiness on feet: Secondary | ICD-10-CM | POA: Diagnosis not present

## 2024-01-11 DIAGNOSIS — M5459 Other low back pain: Secondary | ICD-10-CM | POA: Diagnosis not present

## 2024-01-11 DIAGNOSIS — R278 Other lack of coordination: Secondary | ICD-10-CM | POA: Insufficient documentation

## 2024-01-11 DIAGNOSIS — R29898 Other symptoms and signs involving the musculoskeletal system: Secondary | ICD-10-CM | POA: Diagnosis not present

## 2024-01-11 NOTE — Therapy (Signed)
 OUTPATIENT PHYSICAL THERAPY NEURO TREATMENT   Patient Name: Johnathan Arnold. MRN: [id number] DOB:1945-11-17, 78 y.o., male Today's Date: 01/11/2024  PCP: Marylynn Verneita CROME, MD  REFERRING PROVIDER: Marylynn Verneita CROME, MD   END OF SESSION:  PT End of Session - 01/11/24 1115     Visit Number 31    Number of Visits 46   corrected   Date for PT Re-Evaluation 03/03/24   corrected   Progress Note Due on Visit 30    PT Start Time 1105    PT Stop Time 1145    PT Time Calculation (min) 40 min    Equipment Utilized During Treatment Gait belt    Activity Tolerance Patient tolerated treatment well    Behavior During Therapy [facility] for tasks assessed/performed                         Past Medical History:  Diagnosis Date   3-vessel coronary artery disease    s/p  5 vessel CABG   Allergy June 2024   Diabetes mellitus without complication (HCC)    History of cardiac catheterization 2011   [facility]   Hyperlipidemia    Hypertension    Hypertriglyceridemia    Neuromuscular disorder [facility]) October 2017   Diagonis Parkinson   Parkinson's disease [facility])    Pneumonia 12/28/2020   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 LIMA to LAD, SVG to D1, OM1 and RPDA   CORONARY ARTERY BYPASS GRAFT  03/1998   5 vessel, [facility]   RIGHT HEART CATH N/A 04/04/2019   Procedure: RIGHT HEART CATH;  Surgeon: Darron Deatrice LABOR, MD;  Location: [facility] INVASIVE CV LAB;  Service: Cardiovascular;  Laterality: N/A;   RIGHT/LEFT HEART CATH AND CORONARY ANGIOGRAPHY N/A 02/01/2018   Procedure: RIGHT/LEFT HEART CATH AND CORONARY ANGIOGRAPHY;  Surgeon: Darron Deatrice LABOR, MD;  Location: [facility] INVASIVE CV LAB;  Service: Cardiovascular;  Laterality: N/A;   Patient Active Problem List   Diagnosis Date Noted   Diverticulitis 07/26/2023   RSV (respiratory syncytial virus pneumonia) 07/26/2023   Impaired  ambulation 07/02/2023   Orthostatic hypotension 05/22/2023   Generalized weakness 05/07/2021   Bradycardia    Anemia, unspecified 02/07/2021   Neutropenia (HCC) 01/29/2021   Thrombocytopenia (HCC) 01/29/2021   Right lower lobe pneumonia 12/28/2020   Hyponatremia 12/22/2020   Bilateral leg weakness 12/20/2020   Prostate cancer screening 09/08/2020   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) 09/06/2020   Low back pain 08/19/2019   Pulmonary hypertension (HCC)    Insomnia 12/21/2018   Sleep apnea in adult 12/09/2018   Periodic limb movement disorder 12/09/2018   Pulmonary nodules 05/18/2018   Wears hearing aid in both ears 05/17/2018   Leg pain, bilateral 02/20/2018   Dyspnea    CKD stage 3a, GFR 45-59 ml/min (HCC) 09/21/2017   History of skin cancer in adulthood 08/14/2016   Parkinson's disease (HCC) 08/09/2016   Bilateral carotid artery stenosis 04/02/2015   Vertigo, peripheral 10/17/2014   Benign prostatic hyperplasia with urinary frequency 01/31/2014   Obesity 04/03/2013   Other malaise and fatigue 09/21/2012   Hyperlipidemia    Essential hypertension    3-vessel coronary artery disease    S/P CABG x 5     ONSET DATE: 07/03/23  REFERRING DIAG: M70.101 (ICD-10-CM) - Weakness of both lower extremities  THERAPY DIAG:  Unsteadiness on feet  Abnormality of gait and mobility  Difficulty in walking, not elsewhere classified  Other abnormalities of gait and mobility  Muscle weakness (generalized)  Other low back pain  Weakness of both hips  Rationale for Evaluation and Treatment: Rehabilitation  SUBJECTIVE:                                                                                                                                                                                             SUBJECTIVE STATEMENT:  Pt reports that he is doing well. Had a [employer]. No pain at start of PT treatment.  Will be seeing MD this afternoon for  follow-up for Clavical fx.   Pt accompanied by: significant other  PERTINENT HISTORY:  From prior PT evaluation:  PD, history of COVID and significant weakness present following this diagnosis and the use of paxlovid ( August 5 dx date, thinks Paxlovid may have exacerbated PD symptoms)    Pt reports having COVID and having significant weakness.  Patient reports he also has back pain that is not present for a long time but was exacerbated more recently.  Patient previously did physical therapy for his back and experienced some relief but has not been consistent with the exercises.  Patient also has history of going to Parkinson's rock steady classes multiple times per week prior to onset of his COVID but he has not been back since. Patient previously ambulated without an assistive device but is now ambulating with a straight point cane.  Patient reports increased foot and ankle weakness in comparison with his hips and knees.  Patient also reports low back pain that is exacerbated with prolonged standing.  PAIN:  Are you having pain? No  PRECAUTIONS: Fall  RED FLAGS: None   WEIGHT BEARING RESTRICTIONS: No  FALLS: Has patient fallen in last 6 months? Yes. Number of falls 6  LIVING ENVIRONMENT: Lives with: lives with their spouse Lives in: House/apartment Stairs: Yes: Internal: 15 steps; on left going up and External: 1 steps; none Has following equipment at home: Quad cane large base and Walker - 2 wheeled  PLOF: Independent  PATIENT GOALS: to improve overall strength, get back to walking more regularly  OBJECTIVE:  Note: Objective measures were completed at Evaluation unless otherwise noted.  DIAGNOSTIC FINDINGS:   EXAM: CT HEAD WITHOUT CONTRAST  IMPRESSION: 1. No acute intracranial process. 2. Mild chronic small vessel ischemic changes. 3. Acute right frontal and maxillary sinusitis.  COGNITION: Overall cognitive status: Within functional limits for tasks  assessed   SENSATION: WFL  COORDINATION: WFL    LOWER EXTREMITY  ROM:     Active  Right Eval Left Eval  Hip flexion    Hip extension    Hip abduction    Hip adduction    Hip internal rotation    Hip external rotation    Knee flexion    Knee extension    Ankle dorsiflexion    Ankle plantarflexion    Ankle inversion    Ankle eversion     (Blank rows = not tested)  LOWER EXTREMITY MMT:    MMT Right Eval Left Eval  Hip flexion 4 4  Hip abduction 4 4  Hip adduction 4 4  Knee flexion 3+ 3+  Knee extension 4- 4-  Ankle dorsiflexion 4+ 4  (Blank rows = not tested)  BED MOBILITY:  No limitations according to the pt.  TRANSFERS: Assistive device utilized: None  Sit to stand: Complete Independence Stand to sit: Complete Independence Chair to chair: Complete Independence Floor: Not tested  FUNCTIONAL TESTS:  5 times sit to stand: 15.03 sec Timed up and go (TUG): 13.38 sec 6 minute walk test: TBD 10 meter walk test: 12.15 sec; 0.82 m/s Dynamic Gait Index: TBD  PATIENT SURVEYS:  ABC scale 70.6%                                                                                                                              TREATMENT DATE: 01/11/24   TA- To improve functional movements patterns for everyday tasks '  Octane reciprocal UE and LE movement training x 6 min at level 4   Lateral step up/down 4inch step x 12 bil  1 LE on step, then cross body reach to touch target x 15 bil  Step up with contralateral LE march x 12 bil on 6 inch step, intermittent UE support   1 LE on 6inch step reciprocal arm swing and weight shift in to front LE. X 20 bil  Difficulty with coordinating reciprocal movement with weight shift into RLE and arm swing initially, but improved with increased repetitions.   TE:  Standing hip abduction x 15 bil 3# AW  Standing hip extension x 15 bil 3# AW  Standing hip flexion x 12 bil 3 # AW   Weighted gait with 3# AW x 453ft for functional  strengthening with cues for improved step length, with fatigue and improved posture.   Throughout session, PT remained attentive to pt needs and provided multiple rest breaks due to mild BLE fatigue   PATIENT EDUCATION: Education details: Pt educated throughout session about proper posture and technique with exercises. Improved exercise technique, movement at target joints, use of target muscles after min to mod verbal, visual, tactile cues  Person educated: Patient and Spouse Education method: Explanation, Demonstration, and Verbal cues Education comprehension: verbalized understanding  HOME EXERCISE PROGRAM: Seated PWR! Moves x 10 ea, handout provided   GOALS: Goals reviewed with patient? Yes  SHORT TERM GOALS: Target date: 09/14/2023    Pt  will be independent with HEP in order to demonstrate increased ability to perform tasks related to occupation/hobbies. Baseline:  Pt to be given HEP at next visit. 7/31: has PWR! Activities, has not completed since fall and shoulder injury   Goal status: ONGOING  LONG TERM GOALS: Target date: 03/03/2024    1.  Patient (> 60 years old) will complete five times sit to stand test in < 15 seconds indicating an increased LE strength and improved balance. Baseline: 15.03 sec 4/14: 11.79 sec  5/29: 11.06 sec Goal status: MET  2.  Patient will increase their ABC scale score to be 80.6% (an increase of 10 points), demonstrating improved balance confidence and reduced fall risk, as measured through weekly assessments. Baseline: 70.6% 4/14:72 % 5/29: 74% 01/07/24: 90% Goal status: ONGOING   3.  Patient will increase Berg Balance score by > 6 points to demonstrate decreased fall risk during functional activities. Baseline: 43 on 3/13 4/14: 49 5/29: 56 Goal status: MET   4.  Patient will reduce timed up and go to <11 seconds to reduce fall risk and demonstrate improved transfer/gait ability. Baseline: 13.38 sec 4/14: 11.4 sec no AD 5/29: 10.78 sec Goal  status: MET  5.  Patient will increase 10 meter walk test to >1.39m/s as to improve gait speed for better community ambulation and to reduce fall risk. Baseline: 12.15 sec; 0.82 m/s 5/29: 1.06 m/s Goal status: MET  6.  Patient will increase six minute walk test distance to >1000 for progression to community ambulator and improve gait ability Baseline: 459 ft with RW stopping at 5:30 due to fatigue  09/21/23:1128 ft with 4WW  Goal status: MET  7.  Patient will increased Minibest by at least 3 points  to reduce fall risk and demonstrate improved transfer/gait ability. Baseline: 11/09/23: 22   7/31:  Goal status: ONGOING.   8.  Patient will be independent with gym-based exercise program that he can complete 2-3 times weekly to build his lower extremity strength and postural strength Baseline: no gym program, wants to continue to progress leg strength  Goal status: ONGOING   ASSESSMENT:  CLINICAL IMPRESSION:  Patient presents to PT motivated to participate. PT treatment focused on large amplitude and trunkal rotation movements; greated difficulty with reciprocal UE movement in staggered position. PT Continues to limit resistive interventions to the R shoulder following recent clavicle fx. Pt reports that he has follow-up with MD this afternoon to address management of R shoulder. Pt will continue to benefit from skilled physical therapy intervention to address impairments, improve QOL, and attain therapy goals.     OBJECTIVE IMPAIRMENTS: Abnormal gait, decreased activity tolerance, decreased balance, decreased endurance, decreased knowledge of use of DME, decreased mobility, difficulty walking, decreased ROM, decreased strength, and decreased safety awareness.   ACTIVITY LIMITATIONS: carrying, lifting, bending, standing, squatting, bathing, and locomotion level  PARTICIPATION LIMITATIONS: cleaning, laundry, driving, shopping, community activity, and yard work  PERSONAL FACTORS: Age,  Education, Past/current experiences, Time since onset of injury/illness/exacerbation, and 3+ comorbidities: orthostatic HTN, syncope, falls, anemia, insomnia, dyspnea are also affecting patient's functional outcome.   REHAB POTENTIAL: Fair pt has been seen in the past.  CLINICAL DECISION MAKING: Evolving/moderate complexity  EVALUATION COMPLEXITY: Moderate  PLAN:  PT FREQUENCY: 2x/week  PT DURATION: 8 weeks  PLANNED INTERVENTIONS: 97110-Therapeutic exercises, 97530- Therapeutic activity, [id number]- Neuromuscular re-education, 97535- Self Care, [id number]- Manual therapy, [phone]- Gait training, Balance training, Stair training, and Vestibular training  PLAN FOR NEXT SESSION:   Balance and strength  interventions PD specific interventions as indicated  Progress with gym based program   Massie FORBES Dollar PT ,DPT Physical Therapist- [facility]  [facility]   4:20 PM 01/11/24

## 2024-01-14 ENCOUNTER — Ambulatory Visit: Admitting: Physical Therapy

## 2024-01-18 ENCOUNTER — Encounter

## 2024-01-18 ENCOUNTER — Ambulatory Visit: Admitting: Physical Therapy

## 2024-01-18 DIAGNOSIS — M5459 Other low back pain: Secondary | ICD-10-CM | POA: Diagnosis not present

## 2024-01-18 DIAGNOSIS — M6281 Muscle weakness (generalized): Secondary | ICD-10-CM

## 2024-01-18 DIAGNOSIS — R2689 Other abnormalities of gait and mobility: Secondary | ICD-10-CM | POA: Diagnosis not present

## 2024-01-18 DIAGNOSIS — R269 Unspecified abnormalities of gait and mobility: Secondary | ICD-10-CM

## 2024-01-18 DIAGNOSIS — R2681 Unsteadiness on feet: Secondary | ICD-10-CM

## 2024-01-18 DIAGNOSIS — R262 Difficulty in walking, not elsewhere classified: Secondary | ICD-10-CM | POA: Diagnosis not present

## 2024-01-18 NOTE — Therapy (Signed)
 OUTPATIENT PHYSICAL THERAPY NEURO TREATMENT   Patient Name: Johnathan Arnold. MRN: [id number] DOB:02/19/1946, 78 y.o., male Today's Date: 01/18/2024  PCP: Marylynn Verneita CROME, MD  REFERRING PROVIDER: Marylynn Verneita CROME, MD   END OF SESSION:  PT End of Session - 01/18/24 1107     Visit Number 32    Number of Visits 46   corrected   Date for PT Re-Evaluation 03/03/24   corrected   Progress Note Due on Visit 40    PT Start Time 1102    PT Stop Time 1142    PT Time Calculation (min) 40 min    Equipment Utilized During Treatment Gait belt    Activity Tolerance Patient tolerated treatment well    Behavior During Therapy [facility] for tasks assessed/performed                          Past Medical History:  Diagnosis Date   3-vessel coronary artery disease    s/p  5 vessel CABG   Allergy June 2024   Diabetes mellitus without complication (HCC)    History of cardiac catheterization 2011   [facility]   Hyperlipidemia    Hypertension    Hypertriglyceridemia    Neuromuscular disorder [facility]) October 2017   Diagonis Parkinson   Parkinson's disease [facility])    Pneumonia 12/28/2020   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 LIMA to LAD, SVG to D1, OM1 and RPDA   CORONARY ARTERY BYPASS GRAFT  03/1998   5 vessel, [facility]   RIGHT HEART CATH N/A 04/04/2019   Procedure: RIGHT HEART CATH;  Surgeon: Darron Deatrice LABOR, MD;  Location: [facility] INVASIVE CV LAB;  Service: Cardiovascular;  Laterality: N/A;   RIGHT/LEFT HEART CATH AND CORONARY ANGIOGRAPHY N/A 02/01/2018   Procedure: RIGHT/LEFT HEART CATH AND CORONARY ANGIOGRAPHY;  Surgeon: Darron Deatrice LABOR, MD;  Location: [facility] INVASIVE CV LAB;  Service: Cardiovascular;  Laterality: N/A;   Patient Active Problem List   Diagnosis Date Noted   Diverticulitis 07/26/2023   RSV (respiratory syncytial virus pneumonia) 07/26/2023   Impaired  ambulation 07/02/2023   Orthostatic hypotension 05/22/2023   Generalized weakness 05/07/2021   Bradycardia    Anemia, unspecified 02/07/2021   Neutropenia (HCC) 01/29/2021   Thrombocytopenia (HCC) 01/29/2021   Right lower lobe pneumonia 12/28/2020   Hyponatremia 12/22/2020   Bilateral leg weakness 12/20/2020   Prostate cancer screening 09/08/2020   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) 09/06/2020   Low back pain 08/19/2019   Pulmonary hypertension (HCC)    Insomnia 12/21/2018   Sleep apnea in adult 12/09/2018   Periodic limb movement disorder 12/09/2018   Pulmonary nodules 05/18/2018   Wears hearing aid in both ears 05/17/2018   Leg pain, bilateral 02/20/2018   Dyspnea    CKD stage 3a, GFR 45-59 ml/min (HCC) 09/21/2017   History of skin cancer in adulthood 08/14/2016   Parkinson's disease (HCC) 08/09/2016   Bilateral carotid artery stenosis 04/02/2015   Vertigo, peripheral 10/17/2014   Benign prostatic hyperplasia with urinary frequency 01/31/2014   Obesity 04/03/2013   Other malaise and fatigue 09/21/2012   Hyperlipidemia    Essential hypertension    3-vessel coronary artery disease    S/P CABG x 5     ONSET DATE: 07/03/23  REFERRING DIAG: M70.101 (ICD-10-CM) - Weakness of both lower extremities  THERAPY DIAG:  Unsteadiness on feet  Abnormality of gait and mobility  Difficulty in walking, not elsewhere classified  Other abnormalities of gait and mobility  Muscle weakness (generalized)  Rationale for Evaluation and Treatment: Rehabilitation  SUBJECTIVE:                                                                                                                                                                                             SUBJECTIVE STATEMENT:  Pt reports doing well today. Pt denies any recent falls/stumbles since prior session. Pt denies any updates to medications or medical appointment since prior session. Pt not been  doing HEP but encouraged to start doing so particularly on the weekends or days has not had therapy or his Parkinson's classes.   Pt accompanied by: significant other  PERTINENT HISTORY:  From prior PT evaluation:  PD, history of COVID and significant weakness present following this diagnosis and the use of paxlovid ( August 5 dx date, thinks Paxlovid may have exacerbated PD symptoms)    Pt reports having COVID and having significant weakness.  Patient reports he also has back pain that is not present for a long time but was exacerbated more recently.  Patient previously did physical therapy for his back and experienced some relief but has not been consistent with the exercises.  Patient also has history of going to Parkinson's rock steady classes multiple times per week prior to onset of his COVID but he has not been back since. Patient previously ambulated without an assistive device but is now ambulating with a straight point cane.  Patient reports increased foot and ankle weakness in comparison with his hips and knees.  Patient also reports low back pain that is exacerbated with prolonged standing.  PAIN:  Are you having pain? No  PRECAUTIONS: Fall  RED FLAGS: None   WEIGHT BEARING RESTRICTIONS: No  FALLS: Has patient fallen in last 6 months? Yes. Number of falls 6  LIVING ENVIRONMENT: Lives with: lives with their spouse Lives in: House/apartment Stairs: Yes: Internal: 15 steps; on left going up and External: 1 steps; none Has following equipment at home: Quad cane large base and Walker - 2 wheeled  PLOF: Independent  PATIENT GOALS: to improve overall strength, get back to walking more regularly  OBJECTIVE:  Note: Objective measures were completed at Evaluation unless otherwise noted.  DIAGNOSTIC FINDINGS:   EXAM: CT HEAD WITHOUT CONTRAST  IMPRESSION: 1. No acute intracranial process. 2. Mild chronic small vessel ischemic changes. 3. Acute right frontal and maxillary  sinusitis.  COGNITION: Overall cognitive status: Within functional limits for tasks assessed  SENSATION: WFL  COORDINATION: WFL    LOWER EXTREMITY ROM:     Active  Right Eval Left Eval  Hip flexion    Hip extension    Hip abduction    Hip adduction    Hip internal rotation    Hip external rotation    Knee flexion    Knee extension    Ankle dorsiflexion    Ankle plantarflexion    Ankle inversion    Ankle eversion     (Blank rows = not tested)  LOWER EXTREMITY MMT:    MMT Right Eval Left Eval  Hip flexion 4 4  Hip abduction 4 4  Hip adduction 4 4  Knee flexion 3+ 3+  Knee extension 4- 4-  Ankle dorsiflexion 4+ 4  (Blank rows = not tested)  BED MOBILITY:  No limitations according to the pt.  TRANSFERS: Assistive device utilized: None  Sit to stand: Complete Independence Stand to sit: Complete Independence Chair to chair: Complete Independence Floor: Not tested  FUNCTIONAL TESTS:  5 times sit to stand: 15.03 sec Timed up and go (TUG): 13.38 sec 6 minute walk test: TBD 10 meter walk test: 12.15 sec; 0.82 m/s Dynamic Gait Index: TBD  PATIENT SURVEYS:  ABC scale 70.6%                                                                                                                              TREATMENT DATE: 01/18/24   TA- To improve functional movements patterns for everyday tasks   Octane reciprocal UE and LE movement training x 6 min at level 4   Standing hip abduction x 15 bil 3# AW  Standing hip extension x 15 bil 3# AW  Standing hip flexion x 12 bil 3 # AW   NMR: To facilitate reeducation of movement, balance, posture, coordination, and/or proprioception/kinesthetic sense.  X 10 of each with 1.5 pound wrist weights donned.  PWR up, twist, step, and rock in seated position  X 10 of each while standing on Airex pad of PWR step, rock, and up.  PWR! Up targets postural strengthening and antigravity extension, PRW! Rock targets functional  weight shifting, PRW! Twist targets trunk rotation and PRW! Step targets transition movements. PWR! Moves target bradykinesia, rigidity, and dyskinesia through targeted functional movements that address four core movement difficulties for people with Parkinson's disease.  Throughout session, PT remained attentive to pt needs and provided multiple rest breaks due to mild BLE fatigue   PATIENT EDUCATION: Education details: Pt educated throughout session about proper posture and technique with exercises. Improved exercise technique, movement at target joints, use of target muscles after min to mod verbal, visual, tactile cues  Person educated: Patient and Spouse Education method: Explanation, Demonstration, and Verbal cues Education comprehension: verbalized understanding  HOME EXERCISE PROGRAM: Seated PWR! Moves x 10 ea, handout provided   GOALS: Goals reviewed with patient? Yes  SHORT TERM GOALS: Target date: 09/14/2023  Pt will be independent with HEP in order to demonstrate increased ability to perform tasks related to occupation/hobbies. Baseline:  Pt to be given HEP at next visit. 7/31: has PWR! Activities, has not completed since fall and shoulder injury   Goal status: ONGOING  LONG TERM GOALS: Target date: 03/03/2024    1.  Patient (> 13 years old) will complete five times sit to stand test in < 15 seconds indicating an increased LE strength and improved balance. Baseline: 15.03 sec 4/14: 11.79 sec  5/29: 11.06 sec Goal status: MET  2.  Patient will increase their ABC scale score to be 80.6% (an increase of 10 points), demonstrating improved balance confidence and reduced fall risk, as measured through weekly assessments. Baseline: 70.6% 4/14:72 % 5/29: 74% 01/07/24: 90% Goal status: ONGOING   3.  Patient will increase Berg Balance score by > 6 points to demonstrate decreased fall risk during functional activities. Baseline: 43 on 3/13 4/14: 49 5/29: 56 Goal status: MET    4.  Patient will reduce timed up and go to <11 seconds to reduce fall risk and demonstrate improved transfer/gait ability. Baseline: 13.38 sec 4/14: 11.4 sec no AD 5/29: 10.78 sec Goal status: MET  5.  Patient will increase 10 meter walk test to >1.7m/s as to improve gait speed for better community ambulation and to reduce fall risk. Baseline: 12.15 sec; 0.82 m/s 5/29: 1.06 m/s Goal status: MET  6.  Patient will increase six minute walk test distance to >1000 for progression to community ambulator and improve gait ability Baseline: 459 ft with RW stopping at 5:30 due to fatigue  09/21/23:1128 ft with 4WW  Goal status: MET  7.  Patient will increased Minibest by at least 3 points  to reduce fall risk and demonstrate improved transfer/gait ability. Baseline: 11/09/23: 22   7/31:  Goal status: ONGOING.   8.  Patient will be independent with gym-based exercise program that he can complete 2-3 times weekly to build his lower extremity strength and postural strength Baseline: no gym program, wants to continue to progress leg strength  Goal status: ONGOING   ASSESSMENT:  CLINICAL IMPRESSION:  Patient presents to PT motivated to participate.  Patient continues with Parkinson's based exercise program as well as exercises to improve his lower extremity strength and functional mobility.  Incorporated some mild right shoulder exercises into session and patient tolerated well with no increase in pain.  Patient reports he has also been cleared to complete a rock steady classes with his upper extremity and said during the 1 time he did this since last session it went well.  Pt will continue to benefit from skilled physical therapy intervention to address impairments, improve QOL, and attain therapy goals.     OBJECTIVE IMPAIRMENTS: Abnormal gait, decreased activity tolerance, decreased balance, decreased endurance, decreased knowledge of use of DME, decreased mobility, difficulty walking, decreased ROM,  decreased strength, and decreased safety awareness.   ACTIVITY LIMITATIONS: carrying, lifting, bending, standing, squatting, bathing, and locomotion level  PARTICIPATION LIMITATIONS: cleaning, laundry, driving, shopping, community activity, and yard work  PERSONAL FACTORS: Age, Education, Past/current experiences, Time since onset of injury/illness/exacerbation, and 3+ comorbidities: orthostatic HTN, syncope, falls, anemia, insomnia, dyspnea are also affecting patient's functional outcome.   REHAB POTENTIAL: Fair pt has been seen in the past.  CLINICAL DECISION MAKING: Evolving/moderate complexity  EVALUATION COMPLEXITY: Moderate  PLAN:  PT FREQUENCY: 2x/week  PT DURATION: 8 weeks  PLANNED INTERVENTIONS: 97110-Therapeutic exercises, 97530- Therapeutic activity, [id number]- Neuromuscular re-education, 97535-  Self Care, [id number]- Manual therapy, [phone]- Gait training, Balance training, Stair training, and Vestibular training  PLAN FOR NEXT SESSION:   Balance and strength interventions PD specific interventions as indicated  Progress with gym based program   Lonni KATHEE Gainer PT ,DPT Physical Therapist- [facility]   11:08 AM 01/18/24

## 2024-01-21 ENCOUNTER — Ambulatory Visit: Admitting: Physical Therapy

## 2024-01-21 DIAGNOSIS — R262 Difficulty in walking, not elsewhere classified: Secondary | ICD-10-CM

## 2024-01-21 DIAGNOSIS — R2681 Unsteadiness on feet: Secondary | ICD-10-CM

## 2024-01-21 DIAGNOSIS — R269 Unspecified abnormalities of gait and mobility: Secondary | ICD-10-CM | POA: Diagnosis not present

## 2024-01-21 DIAGNOSIS — E119 Type 2 diabetes mellitus without complications: Secondary | ICD-10-CM | POA: Diagnosis not present

## 2024-01-21 DIAGNOSIS — R2689 Other abnormalities of gait and mobility: Secondary | ICD-10-CM

## 2024-01-21 DIAGNOSIS — M6281 Muscle weakness (generalized): Secondary | ICD-10-CM

## 2024-01-21 DIAGNOSIS — M5459 Other low back pain: Secondary | ICD-10-CM | POA: Diagnosis not present

## 2024-01-21 NOTE — Therapy (Signed)
 OUTPATIENT PHYSICAL THERAPY NEURO TREATMENT   Patient Name: Johnathan Arnold. MRN: [id number] DOB:[birth year], 78 y.o., male Today's Date: 01/21/2024  PCP: Marylynn Verneita CROME, MD  REFERRING PROVIDER: Marylynn Verneita CROME, MD   END OF SESSION:  PT End of Session - 01/21/24 1155     Visit Number 33    Number of Visits 46   corrected   Date for PT Re-Evaluation 03/03/24   corrected   Progress Note Due on Visit 40    PT Start Time 1146    PT Stop Time 1226    PT Time Calculation (min) 40 min    Equipment Utilized During Treatment Gait belt    Activity Tolerance Patient tolerated treatment well    Behavior During Therapy [facility] for tasks assessed/performed                          Past Medical History:  Diagnosis Date   3-vessel coronary artery disease    s/p  5 vessel CABG   Allergy June 2024   Diabetes mellitus without complication (HCC)    History of cardiac catheterization 2011   [facility]   Hyperlipidemia    Hypertension    Hypertriglyceridemia    Neuromuscular disorder [facility]) October 2017   Diagonis Parkinson   Parkinson's disease [facility])    Pneumonia 12/28/2020   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 LIMA to LAD, SVG to D1, OM1 and RPDA   CORONARY ARTERY BYPASS GRAFT  03/1998   5 vessel, [facility]   RIGHT HEART CATH N/A 04/04/2019   Procedure: RIGHT HEART CATH;  Surgeon: Darron Deatrice LABOR, MD;  Location: [facility] INVASIVE CV LAB;  Service: Cardiovascular;  Laterality: N/A;   RIGHT/LEFT HEART CATH AND CORONARY ANGIOGRAPHY N/A 02/01/2018   Procedure: RIGHT/LEFT HEART CATH AND CORONARY ANGIOGRAPHY;  Surgeon: Darron Deatrice LABOR, MD;  Location: [facility] INVASIVE CV LAB;  Service: Cardiovascular;  Laterality: N/A;   Patient Active Problem List   Diagnosis Date Noted   Diverticulitis 07/26/2023   RSV (respiratory syncytial virus pneumonia) 07/26/2023   Impaired  ambulation 07/02/2023   Orthostatic hypotension 05/22/2023   Generalized weakness 05/07/2021   Bradycardia    Anemia, unspecified 02/07/2021   Neutropenia (HCC) 01/29/2021   Thrombocytopenia (HCC) 01/29/2021   Right lower lobe pneumonia 12/28/2020   Hyponatremia 12/22/2020   Bilateral leg weakness 12/20/2020   Prostate cancer screening 09/08/2020   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) 09/06/2020   Low back pain 08/19/2019   Pulmonary hypertension (HCC)    Insomnia 12/21/2018   Sleep apnea in adult 12/09/2018   Periodic limb movement disorder 12/09/2018   Pulmonary nodules 05/18/2018   Wears hearing aid in both ears 05/17/2018   Leg pain, bilateral 02/20/2018   Dyspnea    CKD stage 3a, GFR 45-59 ml/min (HCC) 09/21/2017   History of skin cancer in adulthood 08/14/2016   Parkinson's disease (HCC) 08/09/2016   Bilateral carotid artery stenosis 04/02/2015   Vertigo, peripheral 10/17/2014   Benign prostatic hyperplasia with urinary frequency 01/31/2014   Obesity 04/03/2013   Other malaise and fatigue 09/21/2012   Hyperlipidemia    Essential hypertension    3-vessel coronary artery disease    S/P CABG x 5     ONSET DATE: 07/03/23  REFERRING DIAG: M70.101 (ICD-10-CM) - Weakness of both lower extremities  THERAPY DIAG:  Unsteadiness on feet  Abnormality of gait and mobility  Difficulty in walking, not elsewhere classified  Other abnormalities of gait and mobility  Muscle weakness (generalized)  Rationale for Evaluation and Treatment: Rehabilitation  SUBJECTIVE:                                                                                                                                                                                             SUBJECTIVE STATEMENT:  Pt reports doing well today. Pt denies any recent falls/stumbles since prior session. Pt denies any updates to medications or medical appointment since prior session. Pt not been  doing HEP but encouraged to start doing so particularly on the weekends or days has not had therapy or his Parkinson's classes.   Pt accompanied by: significant other  PERTINENT HISTORY:  From prior PT evaluation:  PD, history of COVID and significant weakness present following this diagnosis and the use of paxlovid ( August 5 dx date, thinks Paxlovid may have exacerbated PD symptoms)    Pt reports having COVID and having significant weakness.  Patient reports he also has back pain that is not present for a long time but was exacerbated more recently.  Patient previously did physical therapy for his back and experienced some relief but has not been consistent with the exercises.  Patient also has history of going to Parkinson's rock steady classes multiple times per week prior to onset of his COVID but he has not been back since. Patient previously ambulated without an assistive device but is now ambulating with a straight point cane.  Patient reports increased foot and ankle weakness in comparison with his hips and knees.  Patient also reports low back pain that is exacerbated with prolonged standing.  PAIN:  Are you having pain? No  PRECAUTIONS: Fall  RED FLAGS: None   WEIGHT BEARING RESTRICTIONS: No  FALLS: Has patient fallen in last 6 months? Yes. Number of falls 6  LIVING ENVIRONMENT: Lives with: lives with their spouse Lives in: House/apartment Stairs: Yes: Internal: 15 steps; on left going up and External: 1 steps; none Has following equipment at home: Quad cane large base and Walker - 2 wheeled  PLOF: Independent  PATIENT GOALS: to improve overall strength, get back to walking more regularly  OBJECTIVE:  Note: Objective measures were completed at Evaluation unless otherwise noted.  DIAGNOSTIC FINDINGS:   EXAM: CT HEAD WITHOUT CONTRAST  IMPRESSION: 1. No acute intracranial process. 2. Mild chronic small vessel ischemic changes. 3. Acute right frontal and maxillary  sinusitis.  COGNITION: Overall cognitive status: Within functional limits for tasks assessed  SENSATION: WFL  COORDINATION: WFL    LOWER EXTREMITY ROM:     Active  Right Eval Left Eval  Hip flexion    Hip extension    Hip abduction    Hip adduction    Hip internal rotation    Hip external rotation    Knee flexion    Knee extension    Ankle dorsiflexion    Ankle plantarflexion    Ankle inversion    Ankle eversion     (Blank rows = not tested)  LOWER EXTREMITY MMT:    MMT Right Eval Left Eval  Hip flexion 4 4  Hip abduction 4 4  Hip adduction 4 4  Knee flexion 3+ 3+  Knee extension 4- 4-  Ankle dorsiflexion 4+ 4  (Blank rows = not tested)  BED MOBILITY:  No limitations according to the pt.  TRANSFERS: Assistive device utilized: None  Sit to stand: Complete Independence Stand to sit: Complete Independence Chair to chair: Complete Independence Floor: Not tested  FUNCTIONAL TESTS:  5 times sit to stand: 15.03 sec Timed up and go (TUG): 13.38 sec 6 minute walk test: TBD 10 meter walk test: 12.15 sec; 0.82 m/s Dynamic Gait Index: TBD  PATIENT SURVEYS:  ABC scale 70.6%                                                                                                                              TREATMENT DATE: 01/21/24   TA- To improve functional movements patterns for everyday tasks   Octane reciprocal UE and LE movement training x 6 min at level 4   Standing hip abduction x 15 bil 3# AW  Standing hip extension x 15 bil 3# AW  Standing hip flexion x 12 bil 3 # AW   NMR: To facilitate reeducation of movement, balance, posture, coordination, and/or proprioception/kinesthetic sense.  X 10 of each  PWR up ( 2 x 10) , twist, step, and rock in seated position  -with UP pt uses RTB to challenge post chain postural musculature strength  -with step 3#AW Airex beam side stepping x 10 ea  Airex beam stance x 45 sec ea side   TA/NMR Gait with 3# AW and  arm swing focus x 170 ft  STS PWR! Up x 10  Gait with 3# AW and arm swing focus x 170 ft  STS PWR! Up x 10  Gait with 3# AW and arm swing focus x 170 ft  STS PWR! Up x 10   PWR! Up targets postural strengthening and antigravity extension, PRW! Rock targets functional weight shifting, PRW! Twist targets trunk rotation and PRW! Step targets transition movements. PWR! Moves target bradykinesia, rigidity, and dyskinesia through targeted functional movements that address four core movement difficulties for people with Parkinson's disease.  Throughout session, PT remained attentive to pt needs and provided multiple rest breaks due to mild BLE fatigue   TE- To improve strength, endurance, mobility, and function  of specific targeted muscle groups or improve joint range of motion or improve muscle flexibility  LAQ 2 x 10 x 3 sec hold with 3# AW   Leg press 3 x 10 @ 70 # - reports as challenging, cues for full Rom and slow eccentric portion    PATIENT EDUCATION: Education details: Pt educated throughout session about proper posture and technique with exercises. Improved exercise technique, movement at target joints, use of target muscles after min to mod verbal, visual, tactile cues  Person educated: Patient and Spouse Education method: Explanation, Demonstration, and Verbal cues Education comprehension: verbalized understanding  HOME EXERCISE PROGRAM: Seated PWR! Moves x 10 ea, handout provided   GOALS: Goals reviewed with patient? Yes  SHORT TERM GOALS: Target date: 09/14/2023    Pt will be independent with HEP in order to demonstrate increased ability to perform tasks related to occupation/hobbies. Baseline:  Pt to be given HEP at next visit. 7/31: has PWR! Activities, has not completed since fall and shoulder injury   Goal status: ONGOING  LONG TERM GOALS: Target date: 03/03/2024    1.  Patient (> 1 years old) will complete five times sit to stand test in < 15 seconds indicating an  increased LE strength and improved balance. Baseline: 15.03 sec 4/14: 11.79 sec  5/29: 11.06 sec Goal status: MET  2.  Patient will increase their ABC scale score to be 80.6% (an increase of 10 points), demonstrating improved balance confidence and reduced fall risk, as measured through weekly assessments. Baseline: 70.6% 4/14:72 % 5/29: 74% 01/07/24: 90% Goal status: ONGOING   3.  Patient will increase Berg Balance score by > 6 points to demonstrate decreased fall risk during functional activities. Baseline: 43 on 3/13 4/14: 49 5/29: 56 Goal status: MET   4.  Patient will reduce timed up and go to <11 seconds to reduce fall risk and demonstrate improved transfer/gait ability. Baseline: 13.38 sec 4/14: 11.4 sec no AD 5/29: 10.78 sec Goal status: MET  5.  Patient will increase 10 meter walk test to >1.70m/s as to improve gait speed for better community ambulation and to reduce fall risk. Baseline: 12.15 sec; 0.82 m/s 5/29: 1.06 m/s Goal status: MET  6.  Patient will increase six minute walk test distance to >1000 for progression to community ambulator and improve gait ability Baseline: 459 ft with RW stopping at 5:30 due to fatigue  09/21/23:1128 ft with 4WW  Goal status: MET  7.  Patient will increased Minibest by at least 3 points  to reduce fall risk and demonstrate improved transfer/gait ability. Baseline: 11/09/23: 22   7/31:  Goal status: ONGOING.   8.  Patient will be independent with gym-based exercise program that he can complete 2-3 times weekly to build his lower extremity strength and postural strength Baseline: no gym program, wants to continue to progress leg strength  Goal status: ONGOING   ASSESSMENT:  CLINICAL IMPRESSION:  Patient presents to PT motivated to participate.  Patient continues with Parkinson's based exercise program as well as exercises to improve his lower extremity strength and functional mobility.  Incorporated PWR! Moves into today's session  intermittently with gait with arm swing focus. PWR! Up targets postural strengthening and antigravity extension, PRW! Rock targets functional weight shifting, PRW! Twist targets trunk rotation and PRW! Step targets transition movements. PWR! Moves target bradykinesia, rigidity, and dyskinesia through targeted functional movements that address four core movement difficulties for people with Parkinson's disease.   Pt will continue to benefit from  skilled physical therapy intervention to address impairments, improve QOL, and attain therapy goals.     OBJECTIVE IMPAIRMENTS: Abnormal gait, decreased activity tolerance, decreased balance, decreased endurance, decreased knowledge of use of DME, decreased mobility, difficulty walking, decreased ROM, decreased strength, and decreased safety awareness.   ACTIVITY LIMITATIONS: carrying, lifting, bending, standing, squatting, bathing, and locomotion level  PARTICIPATION LIMITATIONS: cleaning, laundry, driving, shopping, community activity, and yard work  PERSONAL FACTORS: Age, Education, Past/current experiences, Time since onset of injury/illness/exacerbation, and 3+ comorbidities: orthostatic HTN, syncope, falls, anemia, insomnia, dyspnea are also affecting patient's functional outcome.   REHAB POTENTIAL: Fair pt has been seen in the past.  CLINICAL DECISION MAKING: Evolving/moderate complexity  EVALUATION COMPLEXITY: Moderate  PLAN:  PT FREQUENCY: 2x/week  PT DURATION: 8 weeks  PLANNED INTERVENTIONS: 97110-Therapeutic exercises, 97530- Therapeutic activity, 97112- Neuromuscular re-education, 97535- Self Care, [id number]- Manual therapy, [phone]- Gait training, Balance training, Stair training, and Vestibular training  PLAN FOR NEXT SESSION:   Balance and strength interventions PD specific interventions as indicated  Progress with gym based program   Lonni KATHEE Gainer PT ,DPT Physical Therapist- [facility]  [facility]    12:37 PM 01/21/24

## 2024-01-22 ENCOUNTER — Other Ambulatory Visit: Payer: Self-pay | Admitting: Neurology

## 2024-01-25 ENCOUNTER — Ambulatory Visit: Admitting: Physical Therapy

## 2024-01-25 ENCOUNTER — Encounter

## 2024-01-25 DIAGNOSIS — R278 Other lack of coordination: Secondary | ICD-10-CM

## 2024-01-25 DIAGNOSIS — R262 Difficulty in walking, not elsewhere classified: Secondary | ICD-10-CM | POA: Diagnosis not present

## 2024-01-25 DIAGNOSIS — M5459 Other low back pain: Secondary | ICD-10-CM

## 2024-01-25 DIAGNOSIS — R2689 Other abnormalities of gait and mobility: Secondary | ICD-10-CM

## 2024-01-25 DIAGNOSIS — R269 Unspecified abnormalities of gait and mobility: Secondary | ICD-10-CM | POA: Diagnosis not present

## 2024-01-25 DIAGNOSIS — M6281 Muscle weakness (generalized): Secondary | ICD-10-CM | POA: Diagnosis not present

## 2024-01-25 DIAGNOSIS — R2681 Unsteadiness on feet: Secondary | ICD-10-CM | POA: Diagnosis not present

## 2024-01-25 DIAGNOSIS — R29898 Other symptoms and signs involving the musculoskeletal system: Secondary | ICD-10-CM

## 2024-01-25 NOTE — Therapy (Signed)
 OUTPATIENT PHYSICAL THERAPY NEURO TREATMENT   Patient Name: Johnathan Arnold. MRN: [id number] DOB:[birth year], 78 y.o., male Today's Date: 01/25/2024  PCP: Marylynn Verneita CROME, MD  REFERRING PROVIDER: Marylynn Verneita CROME, MD   END OF SESSION:  PT End of Session - 01/25/24 1105     Visit Number 34    Number of Visits 46   corrected   Date for PT Re-Evaluation 03/03/24   corrected   Progress Note Due on Visit 40    PT Start Time 1105    PT Stop Time 1145    PT Time Calculation (min) 40 min    Equipment Utilized During Treatment Gait belt    Activity Tolerance Patient tolerated treatment well    Behavior During Therapy [facility] for tasks assessed/performed                          Past Medical History:  Diagnosis Date   3-vessel coronary artery disease    s/p  5 vessel CABG   Allergy June 2024   Diabetes mellitus without complication (HCC)    History of cardiac catheterization 2011   [facility]   Hyperlipidemia    Hypertension    Hypertriglyceridemia    Neuromuscular disorder [facility]) October 2017   Diagonis Parkinson   Parkinson's disease [facility])    Pneumonia 12/28/2020   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 LIMA to LAD, SVG to D1, OM1 and RPDA   CORONARY ARTERY BYPASS GRAFT  03/1998   5 vessel, [facility]   RIGHT HEART CATH N/A 04/04/2019   Procedure: RIGHT HEART CATH;  Surgeon: Darron Deatrice LABOR, MD;  Location: [facility] INVASIVE CV LAB;  Service: Cardiovascular;  Laterality: N/A;   RIGHT/LEFT HEART CATH AND CORONARY ANGIOGRAPHY N/A 02/01/2018   Procedure: RIGHT/LEFT HEART CATH AND CORONARY ANGIOGRAPHY;  Surgeon: Darron Deatrice LABOR, MD;  Location: [facility] INVASIVE CV LAB;  Service: Cardiovascular;  Laterality: N/A;   Patient Active Problem List   Diagnosis Date Noted   Diverticulitis 07/26/2023   RSV (respiratory syncytial virus pneumonia) 07/26/2023   Impaired  ambulation 07/02/2023   Orthostatic hypotension 05/22/2023   Generalized weakness 05/07/2021   Bradycardia    Anemia, unspecified 02/07/2021   Neutropenia (HCC) 01/29/2021   Thrombocytopenia (HCC) 01/29/2021   Right lower lobe pneumonia 12/28/2020   Hyponatremia 12/22/2020   Bilateral leg weakness 12/20/2020   Prostate cancer screening 09/08/2020   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) 09/06/2020   Low back pain 08/19/2019   Pulmonary hypertension (HCC)    Insomnia 12/21/2018   Sleep apnea in adult 12/09/2018   Periodic limb movement disorder 12/09/2018   Pulmonary nodules 05/18/2018   Wears hearing aid in both ears 05/17/2018   Leg pain, bilateral 02/20/2018   Dyspnea    CKD stage 3a, GFR 45-59 ml/min (HCC) 09/21/2017   History of skin cancer in adulthood 08/14/2016   Parkinson's disease (HCC) 08/09/2016   Bilateral carotid artery stenosis 04/02/2015   Vertigo, peripheral 10/17/2014   Benign prostatic hyperplasia with urinary frequency 01/31/2014   Obesity 04/03/2013   Other malaise and fatigue 09/21/2012   Hyperlipidemia    Essential hypertension    3-vessel coronary artery disease    S/P CABG x 5     ONSET DATE: 07/03/23  REFERRING DIAG: M70.101 (ICD-10-CM) - Weakness of both lower extremities  THERAPY DIAG:  Unsteadiness on feet  Abnormality of gait and mobility  Difficulty in walking, not elsewhere classified  Other abnormalities of gait and mobility  Muscle weakness (generalized)  Other low back pain  Weakness of both hips  Other lack of coordination  Rationale for Evaluation and Treatment: Rehabilitation  SUBJECTIVE:                                                                                                                                                                                             SUBJECTIVE STATEMENT:  Pt reports doing well today. Pt denies any recent falls/stumbles since prior session. Pt denies any  updates to medications or medical appointment since prior session. Pt not been doing HEP but encouraged to start doing so particularly on the weekends or days has not had therapy or his Parkinson's classes.   Pt accompanied by: significant other  PERTINENT HISTORY:  From prior PT evaluation:  PD, history of COVID and significant weakness present following this diagnosis and the use of paxlovid ( August 5 dx date, thinks Paxlovid may have exacerbated PD symptoms)    Pt reports having COVID and having significant weakness.  Patient reports he also has back pain that is not present for a long time but was exacerbated more recently.  Patient previously did physical therapy for his back and experienced some relief but has not been consistent with the exercises.  Patient also has history of going to Parkinson's rock steady classes multiple times per week prior to onset of his COVID but he has not been back since. Patient previously ambulated without an assistive device but is now ambulating with a straight point cane.  Patient reports increased foot and ankle weakness in comparison with his hips and knees.  Patient also reports low back pain that is exacerbated with prolonged standing.  PAIN:  Are you having pain? No  PRECAUTIONS: Fall  RED FLAGS: None   WEIGHT BEARING RESTRICTIONS: No  FALLS: Has patient fallen in last 6 months? Yes. Number of falls 6  LIVING ENVIRONMENT: Lives with: lives with their spouse Lives in: House/apartment Stairs: Yes: Internal: 15 steps; on left going up and External: 1 steps; none Has following equipment at home: Quad cane large base and Walker - 2 wheeled  PLOF: Independent  PATIENT GOALS: to improve overall strength, get back to walking more regularly  OBJECTIVE:  Note: Objective measures were completed at Evaluation unless otherwise noted.  DIAGNOSTIC FINDINGS:   EXAM: CT HEAD WITHOUT CONTRAST  IMPRESSION: 1. No acute intracranial process. 2.  Mild chronic small vessel ischemic changes. 3. Acute right frontal and  maxillary sinusitis.  COGNITION: Overall cognitive status: Within functional limits for tasks assessed   SENSATION: WFL  COORDINATION: WFL    LOWER EXTREMITY ROM:     Active  Right Eval Left Eval  Hip flexion    Hip extension    Hip abduction    Hip adduction    Hip internal rotation    Hip external rotation    Knee flexion    Knee extension    Ankle dorsiflexion    Ankle plantarflexion    Ankle inversion    Ankle eversion     (Blank rows = not tested)  LOWER EXTREMITY MMT:    MMT Right Eval Left Eval  Hip flexion 4 4  Hip abduction 4 4  Hip adduction 4 4  Knee flexion 3+ 3+  Knee extension 4- 4-  Ankle dorsiflexion 4+ 4  (Blank rows = not tested)  BED MOBILITY:  No limitations according to the pt.  TRANSFERS: Assistive device utilized: None  Sit to stand: Complete Independence Stand to sit: Complete Independence Chair to chair: Complete Independence Floor: Not tested  FUNCTIONAL TESTS:  5 times sit to stand: 15.03 sec Timed up and go (TUG): 13.38 sec 6 minute walk test: TBD 10 meter walk test: 12.15 sec; 0.82 m/s Dynamic Gait Index: TBD  PATIENT SURVEYS:  ABC scale 70.6%                                                                                                                              TREATMENT DATE: 01/25/24    PT applied 3# AW and 1# WW at start of treatment as well as gait pelt, which remained in place throughout entire treatment. CGA/supervision assist for safety unless otherwise noted   Weighted gait training 464ft x 4 throughout session; cues for improved reciprocal UE movement and posture intermittently throughout.  Between weighted gait training, performed 1-2 NMR interventions with UE and LE weights in place.  First bout: Sit<>stand with UE swing into extension  Second bout: standing PWR twist and rock  x 12 each  Third bout: standing  power step x 10  bil and forward lunge with shoulder extension/abduction  Fourth bout: standing forward lunge with shoulder abduction x 10 bil    PATIENT EDUCATION: Education details: Pt educated throughout session about proper posture and technique with exercises. Improved exercise technique, movement at target joints, use of target muscles after min to mod verbal, visual, tactile cues  Person educated: Patient and Spouse Education method: Explanation, Demonstration, and Verbal cues Education comprehension: verbalized understanding  HOME EXERCISE PROGRAM: Seated PWR! Moves x 10 ea, handout provided   GOALS: Goals reviewed with patient? Yes  SHORT TERM GOALS: Target date: 09/14/2023    Pt will be independent with HEP in order to demonstrate increased ability to perform tasks related to occupation/hobbies. Baseline:  Pt to be given HEP at next visit. 7/31: has PWR! Activities, has not completed since fall and shoulder  injury   Goal status: ONGOING  LONG TERM GOALS: Target date: 03/03/2024    1.  Patient (> 17 years old) will complete five times sit to stand test in < 15 seconds indicating an increased LE strength and improved balance. Baseline: 15.03 sec 4/14: 11.79 sec  5/29: 11.06 sec Goal status: MET  2.  Patient will increase their ABC scale score to be 80.6% (an increase of 10 points), demonstrating improved balance confidence and reduced fall risk, as measured through weekly assessments. Baseline: 70.6% 4/14:72 % 5/29: 74% 01/07/24: 90% Goal status: ONGOING   3.  Patient will increase Berg Balance score by > 6 points to demonstrate decreased fall risk during functional activities. Baseline: 43 on 3/13 4/14: 49 5/29: 56 Goal status: MET   4.  Patient will reduce timed up and go to <11 seconds to reduce fall risk and demonstrate improved transfer/gait ability. Baseline: 13.38 sec 4/14: 11.4 sec no AD 5/29: 10.78 sec Goal status: MET  5.  Patient will increase 10 meter walk test to >1.26m/s  as to improve gait speed for better community ambulation and to reduce fall risk. Baseline: 12.15 sec; 0.82 m/s 5/29: 1.06 m/s Goal status: MET  6.  Patient will increase six minute walk test distance to >1000 for progression to community ambulator and improve gait ability Baseline: 459 ft with RW stopping at 5:30 due to fatigue  09/21/23:1128 ft with 4WW  Goal status: MET  7.  Patient will increased Minibest by at least 3 points  to reduce fall risk and demonstrate improved transfer/gait ability. Baseline: 11/09/23: 22   7/31:  Goal status: ONGOING.   8.  Patient will be independent with gym-based exercise program that he can complete 2-3 times weekly to build his lower extremity strength and postural strength Baseline: no gym program, wants to continue to progress leg strength  Goal status: ONGOING   ASSESSMENT:  CLINICAL IMPRESSION:  Patient presents to PT motivated to participate.  Patient continues with Parkinson's based exercise program as well as exercises to improve his lower extremity strength and functional mobility.  Incorporated PWR! Moves into today's session intermittently with gait with arm swing focus.PRW! Rock targets functional weight shifting, PRW! Twist targets trunk rotation and PRW! Step targets transition movements. PWR! Moves target bradykinesia, rigidity, and dyskinesia through targeted functional movements that address four core movement difficulties for people with Parkinson's disease. High volume gait training with AW and WW improved motor recruitment and improve amplitude of movement through BUE and BLE with instruction to maintain reciprocal gait pattern    Pt will continue to benefit from skilled physical therapy intervention to address impairments, improve QOL, and attain therapy goals.     OBJECTIVE IMPAIRMENTS: Abnormal gait, decreased activity tolerance, decreased balance, decreased endurance, decreased knowledge of use of DME, decreased mobility, difficulty  walking, decreased ROM, decreased strength, and decreased safety awareness.   ACTIVITY LIMITATIONS: carrying, lifting, bending, standing, squatting, bathing, and locomotion level  PARTICIPATION LIMITATIONS: cleaning, laundry, driving, shopping, community activity, and yard work  PERSONAL FACTORS: Age, Education, Past/current experiences, Time since onset of injury/illness/exacerbation, and 3+ comorbidities: orthostatic HTN, syncope, falls, anemia, insomnia, dyspnea are also affecting patient's functional outcome.   REHAB POTENTIAL: Fair pt has been seen in the past.  CLINICAL DECISION MAKING: Evolving/moderate complexity  EVALUATION COMPLEXITY: Moderate  PLAN:  PT FREQUENCY: 2x/week  PT DURATION: 8 weeks  PLANNED INTERVENTIONS: 97110-Therapeutic exercises, 97530- Therapeutic activity, [id number]- Neuromuscular re-education, 97535- Self Care, [id number]- Manual therapy, [id number]- Gait training, Balance  training, Stair training, and Vestibular training  PLAN FOR NEXT SESSION:  Balance and strength interventions PD specific interventions as indicated  Progress with gym based program   Massie FORBES Dollar PT ,DPT Physical Therapist- [facility]   11:06 AM 01/25/24

## 2024-01-28 ENCOUNTER — Ambulatory Visit: Admitting: Physical Therapy

## 2024-01-28 DIAGNOSIS — M6281 Muscle weakness (generalized): Secondary | ICD-10-CM | POA: Diagnosis not present

## 2024-01-28 DIAGNOSIS — R269 Unspecified abnormalities of gait and mobility: Secondary | ICD-10-CM | POA: Diagnosis not present

## 2024-01-28 DIAGNOSIS — R2681 Unsteadiness on feet: Secondary | ICD-10-CM

## 2024-01-28 DIAGNOSIS — R262 Difficulty in walking, not elsewhere classified: Secondary | ICD-10-CM

## 2024-01-28 DIAGNOSIS — M5459 Other low back pain: Secondary | ICD-10-CM | POA: Diagnosis not present

## 2024-01-28 DIAGNOSIS — R2689 Other abnormalities of gait and mobility: Secondary | ICD-10-CM

## 2024-01-28 NOTE — Therapy (Signed)
 OUTPATIENT PHYSICAL THERAPY NEURO TREATMENT   Patient Name: Johnathan Arnold. MRN: [id number] DOB:[birth year], 78 y.o., male 41 Date: 01/28/2024  PCP: Marylynn Verneita CROME, MD  REFERRING PROVIDER: Marylynn Verneita CROME, MD   END OF SESSION:  PT End of Session - 01/28/24 1149     Visit Number 35    Number of Visits 46   corrected   Date for PT Re-Evaluation 03/03/24   corrected   Progress Note Due on Visit 40    PT Start Time 1142    PT Stop Time 1224    PT Time Calculation (min) 42 min    Equipment Utilized During Treatment Gait belt    Activity Tolerance Patient tolerated treatment well    Behavior During Therapy [facility] for tasks assessed/performed                           Past Medical History:  Diagnosis Date   3-vessel coronary artery disease    s/p  5 vessel CABG   Allergy June 2024   Diabetes mellitus without complication (HCC)    History of cardiac catheterization 2011   [facility]   Hyperlipidemia    Hypertension    Hypertriglyceridemia    Neuromuscular disorder [facility]) October 2017   Diagonis Parkinson   Parkinson's disease [facility])    Pneumonia 12/28/2020   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 LIMA to LAD, SVG to D1, OM1 and RPDA   CORONARY ARTERY BYPASS GRAFT  03/1998   5 vessel, [facility]   RIGHT HEART CATH N/A 04/04/2019   Procedure: RIGHT HEART CATH;  Surgeon: Darron Deatrice LABOR, MD;  Location: [facility] INVASIVE CV LAB;  Service: Cardiovascular;  Laterality: N/A;   RIGHT/LEFT HEART CATH AND CORONARY ANGIOGRAPHY N/A 02/01/2018   Procedure: RIGHT/LEFT HEART CATH AND CORONARY ANGIOGRAPHY;  Surgeon: Darron Deatrice LABOR, MD;  Location: [facility] INVASIVE CV LAB;  Service: Cardiovascular;  Laterality: N/A;   Patient Active Problem List   Diagnosis Date Noted   Diverticulitis 07/26/2023   RSV (respiratory syncytial virus pneumonia) 07/26/2023    Impaired ambulation 07/02/2023   Orthostatic hypotension 05/22/2023   Generalized weakness 05/07/2021   Bradycardia    Anemia, unspecified 02/07/2021   Neutropenia (HCC) 01/29/2021   Thrombocytopenia (HCC) 01/29/2021   Right lower lobe pneumonia 12/28/2020   Hyponatremia 12/22/2020   Bilateral leg weakness 12/20/2020   Prostate cancer screening 09/08/2020   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) 09/06/2020   Low back pain 08/19/2019   Pulmonary hypertension (HCC)    Insomnia 12/21/2018   Sleep apnea in adult 12/09/2018   Periodic limb movement disorder 12/09/2018   Pulmonary nodules 05/18/2018   Wears hearing aid in both ears 05/17/2018   Leg pain, bilateral 02/20/2018   Dyspnea    CKD stage 3a, GFR 45-59 ml/min (HCC) 09/21/2017   History of skin cancer in adulthood 08/14/2016   Parkinson's disease (HCC) 08/09/2016   Bilateral carotid artery stenosis 04/02/2015   Vertigo, peripheral 10/17/2014   Benign prostatic hyperplasia with urinary frequency 01/31/2014   Obesity 04/03/2013   Other malaise and fatigue 09/21/2012   Hyperlipidemia    Essential hypertension    3-vessel coronary artery disease    S/P CABG x 5     ONSET DATE: 07/03/23  REFERRING DIAG: M70.101 (ICD-10-CM) - Weakness of both lower  extremities   THERAPY DIAG:  Unsteadiness on feet  Abnormality of gait and mobility  Difficulty in walking, not elsewhere classified  Other abnormalities of gait and mobility  Muscle weakness (generalized)  Rationale for Evaluation and Treatment: Rehabilitation  SUBJECTIVE:                                                                                                                                                                                             SUBJECTIVE STATEMENT:  Pt reports doing well today. Pt denies any recent falls/stumbles since prior session. Pt denies any updates to medications or medical appointment since prior session. Has  not tried exercises yet but did have frock steady yesterday so he got some PD specific activities in during that.    Pt accompanied by: significant other  PERTINENT HISTORY:  From prior PT evaluation:  PD, history of COVID and significant weakness present following this diagnosis and the use of paxlovid ( August 5 dx date, thinks Paxlovid may have exacerbated PD symptoms)    Pt reports having COVID and having significant weakness.  Patient reports he also has back pain that is not present for a long time but was exacerbated more recently.  Patient previously did physical therapy for his back and experienced some relief but has not been consistent with the exercises.  Patient also has history of going to Parkinson's rock steady classes multiple times per week prior to onset of his COVID but he has not been back since. Patient previously ambulated without an assistive device but is now ambulating with a straight point cane.  Patient reports increased foot and ankle weakness in comparison with his hips and knees.  Patient also reports low back pain that is exacerbated with prolonged standing.  PAIN:  Are you having pain? No  PRECAUTIONS: Fall  RED FLAGS: None   WEIGHT BEARING RESTRICTIONS: No  FALLS: Has patient fallen in last 6 months? Yes. Number of falls 6  LIVING ENVIRONMENT: Lives with: lives with their spouse Lives in: House/apartment Stairs: Yes: Internal: 15 steps; on left going up and External: 1 steps; none Has following equipment at home: Quad cane large base and Walker - 2 wheeled  PLOF: Independent  PATIENT GOALS: to improve overall strength, get back to walking more regularly  OBJECTIVE:  Note: Objective measures were completed at Evaluation unless otherwise noted.  DIAGNOSTIC FINDINGS:   EXAM: CT HEAD WITHOUT CONTRAST  IMPRESSION: 1. No acute intracranial process. 2. Mild chronic small vessel ischemic changes. 3. Acute right frontal and maxillary  sinusitis.  COGNITION: Overall cognitive status: Within functional limits for tasks assessed  SENSATION: WFL  COORDINATION: WFL    LOWER EXTREMITY ROM:     Active  Right Eval Left Eval  Hip flexion    Hip extension    Hip abduction    Hip adduction    Hip internal rotation    Hip external rotation    Knee flexion    Knee extension    Ankle dorsiflexion    Ankle plantarflexion    Ankle inversion    Ankle eversion     (Blank rows = not tested)  LOWER EXTREMITY MMT:    MMT Right Eval Left Eval  Hip flexion 4 4  Hip abduction 4 4  Hip adduction 4 4  Knee flexion 3+ 3+  Knee extension 4- 4-  Ankle dorsiflexion 4+ 4  (Blank rows = not tested)  BED MOBILITY:  No limitations according to the pt.  TRANSFERS: Assistive device utilized: None  Sit to stand: Complete Independence Stand to sit: Complete Independence Chair to chair: Complete Independence Floor: Not tested  FUNCTIONAL TESTS:  5 times sit to stand: 15.03 sec Timed up and go (TUG): 13.38 sec 6 minute walk test: TBD 10 meter walk test: 12.15 sec; 0.82 m/s Dynamic Gait Index: TBD  PATIENT SURVEYS:  ABC scale 70.6%                                                                                                                              TREATMENT DATE: 01/28/24  TA and NMR  Level 4 octane UE and LE reciprocal movement training x 6 min     PT applied 3# AW and 1# WW at start of treatment as well as gait belt, which remained in place throughout entire treatment. CGA/supervision assist for safety unless otherwise noted    Sit to stand PWR! Up 2 x 10  Weighted gait x 320 ft  Seated PWR! Twist 2 x 10  Weighted gait x 300 ft  Standing PWR! Step 2 x 10 ea LE  Weighted gait x 320 ft  Forward step with reciprocal arm raise 2 x 10 ea LE/ UE  Weighted gait x 320 ft  Step over step pattern over hurdle, then airex, then airex and return x 4 laps, 1 post LOB stepping over hurdle   PWR! Up  targets postural strengthening and antigravity extension, PRW! Rock targets functional weight shifting, PRW! Twist targets trunk rotation and PRW! Step targets transition movements. PWR! Moves target bradykinesia, rigidity, and dyskinesia through targeted functional movements that address four core movement difficulties for people with Parkinson's disease.  Unless otherwise stated, CGA was provided and gait belt donned in order to ensure pt safety   PATIENT EDUCATION: Education details: Pt educated throughout session about proper posture and technique with exercises. Improved exercise technique, movement at target joints, use of target muscles after min to mod verbal, visual, tactile cues  Person educated: Patient and Spouse Education method: Explanation, Demonstration, and Verbal cues Education comprehension: verbalized understanding  HOME EXERCISE PROGRAM: Seated PWR! Moves x 10 ea, handout provided   GOALS: Goals reviewed with patient? Yes  SHORT TERM GOALS: Target date: 09/14/2023    Pt will be independent with HEP in order to demonstrate increased ability to perform tasks related to occupation/hobbies. Baseline:  Pt to be given HEP at next visit. 7/31: has PWR! Activities, has not completed since fall and shoulder injury   Goal status: ONGOING  LONG TERM GOALS: Target date: 03/03/2024    1.  Patient (> 71 years old) will complete five times sit to stand test in < 15 seconds indicating an increased LE strength and improved balance. Baseline: 15.03 sec 4/14: 11.79 sec  5/29: 11.06 sec Goal status: MET  2.  Patient will increase their ABC scale score to be 80.6% (an increase of 10 points), demonstrating improved balance confidence and reduced fall risk, as measured through weekly assessments. Baseline: 70.6% 4/14:72 % 5/29: 74% 01/07/24: 90% Goal status: ONGOING   3.  Patient will increase Berg Balance score by > 6 points to demonstrate decreased fall risk during functional  activities. Baseline: 43 on 3/13 4/14: 49 5/29: 56 Goal status: MET   4.  Patient will reduce timed up and go to <11 seconds to reduce fall risk and demonstrate improved transfer/gait ability. Baseline: 13.38 sec 4/14: 11.4 sec no AD 5/29: 10.78 sec Goal status: MET  5.  Patient will increase 10 meter walk test to >1.74m/s as to improve gait speed for better community ambulation and to reduce fall risk. Baseline: 12.15 sec; 0.82 m/s 5/29: 1.06 m/s Goal status: MET  6.  Patient will increase six minute walk test distance to >1000 for progression to community ambulator and improve gait ability Baseline: 459 ft with RW stopping at 5:30 due to fatigue  09/21/23:1128 ft with 4WW  Goal status: MET  7.  Patient will increased Minibest by at least 3 points  to reduce fall risk and demonstrate improved transfer/gait ability. Baseline: 11/09/23: 22   7/31:  Goal status: ONGOING.   8.  Patient will be independent with gym-based exercise program that he can complete 2-3 times weekly to build his lower extremity strength and postural strength Baseline: no gym program, wants to continue to progress leg strength  Goal status: ONGOING   ASSESSMENT:  CLINICAL IMPRESSION:  Patient presents to PT motivated to participate. Patient continues with Parkinson's based exercise program as well as exercises to improve his lower extremity strength and functional mobility.  Incorporated PWR! Moves into today's session intermittently with gait with arm swing focus. High volume interval gait training with AW and WW improved motor recruitment and improve amplitude of movement through BUE and BLE with instruction to maintain reciprocal gait pattern with Ues. Pt will continue to benefit from skilled physical therapy intervention to address impairments, improve QOL, and attain therapy goals.     OBJECTIVE IMPAIRMENTS: Abnormal gait, decreased activity tolerance, decreased balance, decreased endurance, decreased knowledge  of use of DME, decreased mobility, difficulty walking, decreased ROM, decreased strength, and decreased safety awareness.   ACTIVITY LIMITATIONS: carrying, lifting, bending, standing, squatting, bathing, and locomotion level  PARTICIPATION LIMITATIONS: cleaning, laundry, driving, shopping, community activity, and yard work  PERSONAL FACTORS: Age, Education, Past/current experiences, Time since onset of injury/illness/exacerbation, and 3+ comorbidities: orthostatic HTN, syncope, falls, anemia, insomnia, dyspnea are also affecting patient's functional outcome.   REHAB POTENTIAL: Fair pt has been seen in the past.  CLINICAL DECISION MAKING: Evolving/moderate complexity  EVALUATION COMPLEXITY: Moderate  PLAN:  PT FREQUENCY: 2x/week  PT DURATION: 8 weeks  PLANNED INTERVENTIONS: 97110-Therapeutic exercises, 97530- Therapeutic activity, 97112- Neuromuscular re-education, 97535- Self Care, [id number]- Manual therapy, [phone]- Gait training, Balance training, Stair training, and Vestibular training  PLAN FOR NEXT SESSION:  Balance and strength interventions PD specific interventions as indicated  Progress with gym based program   Lonni KATHEE Gainer PT ,DPT Physical Therapist- [facility]  [facility]   1:23 PM 01/28/24

## 2024-02-01 ENCOUNTER — Encounter

## 2024-02-01 ENCOUNTER — Ambulatory Visit: Admitting: Physical Therapy

## 2024-02-01 DIAGNOSIS — R269 Unspecified abnormalities of gait and mobility: Secondary | ICD-10-CM | POA: Diagnosis not present

## 2024-02-01 DIAGNOSIS — R262 Difficulty in walking, not elsewhere classified: Secondary | ICD-10-CM

## 2024-02-01 DIAGNOSIS — M6281 Muscle weakness (generalized): Secondary | ICD-10-CM

## 2024-02-01 DIAGNOSIS — R2681 Unsteadiness on feet: Secondary | ICD-10-CM | POA: Diagnosis not present

## 2024-02-01 DIAGNOSIS — M5459 Other low back pain: Secondary | ICD-10-CM | POA: Diagnosis not present

## 2024-02-01 DIAGNOSIS — R2689 Other abnormalities of gait and mobility: Secondary | ICD-10-CM

## 2024-02-01 NOTE — Therapy (Signed)
 OUTPATIENT PHYSICAL THERAPY NEURO TREATMENT   Patient Name: Johnathan Arnold. MRN: [id number] DOB:1945/12/28, 78 y.o., male 49 Date: 02/01/2024  PCP: Marylynn Verneita CROME, MD  REFERRING PROVIDER: Marylynn Verneita CROME, MD   END OF SESSION:  PT End of Session - 02/01/24 1132     Visit Number 36    Number of Visits 46   corrected   Date for PT Re-Evaluation 03/03/24   corrected   Progress Note Due on Visit 40    PT Start Time 1100    PT Stop Time 1142    PT Time Calculation (min) 42 min    Equipment Utilized During Treatment Gait belt    Activity Tolerance Patient tolerated treatment well    Behavior During Therapy [facility] for tasks assessed/performed                            Past Medical History:  Diagnosis Date   3-vessel coronary artery disease    s/p  5 vessel CABG   Allergy June 2024   Diabetes mellitus without complication (HCC)    History of cardiac catheterization 2011   [facility]   Hyperlipidemia    Hypertension    Hypertriglyceridemia    Neuromuscular disorder [facility]) October 2017   Diagonis Parkinson   Parkinson's disease 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 LIMA to LAD, SVG to D1, OM1 and RPDA   CORONARY ARTERY BYPASS GRAFT  03/1998   5 vessel, [facility]   RIGHT HEART CATH N/A 04/04/2019   Procedure: RIGHT HEART CATH;  Surgeon: Darron Deatrice LABOR, MD;  Location: [facility] INVASIVE CV LAB;  Service: Cardiovascular;  Laterality: N/A;   RIGHT/LEFT HEART CATH AND CORONARY ANGIOGRAPHY N/A 02/01/2018   Procedure: RIGHT/LEFT HEART CATH AND CORONARY ANGIOGRAPHY;  Surgeon: Darron Deatrice LABOR, MD;  Location: [facility] INVASIVE CV LAB;  Service: Cardiovascular;  Laterality: N/A;   Patient Active Problem List   Diagnosis Date Noted   Diverticulitis 07/26/2023   RSV (respiratory syncytial virus pneumonia) 07/26/2023    Impaired ambulation 07/02/2023   Orthostatic hypotension 05/22/2023   Generalized weakness 05/07/2021   Bradycardia    Anemia, unspecified 02/07/2021   Neutropenia (HCC) 01/29/2021   Thrombocytopenia (HCC) 01/29/2021   Right lower lobe pneumonia 12/28/2020   Hyponatremia 12/22/2020   Bilateral leg weakness 12/20/2020   Prostate cancer screening 09/08/2020   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) 09/06/2020   Low back pain 08/19/2019   Pulmonary hypertension (HCC)    Insomnia 12/21/2018   Sleep apnea in adult 12/09/2018   Periodic limb movement disorder 12/09/2018   Pulmonary nodules 05/18/2018   Wears hearing aid in both ears 05/17/2018   Leg pain, bilateral 02/20/2018   Dyspnea    CKD stage 3a, GFR 45-59 ml/min (HCC) 09/21/2017   History of skin cancer in adulthood 08/14/2016   Parkinson's disease (HCC) 08/09/2016   Bilateral carotid artery stenosis 04/02/2015   Vertigo, peripheral 10/17/2014   Benign prostatic hyperplasia with urinary frequency 01/31/2014   Obesity 04/03/2013   Other malaise and fatigue 09/21/2012   Hyperlipidemia    Essential hypertension    3-vessel coronary artery disease    S/P CABG x 5     ONSET DATE: 07/03/23  REFERRING DIAG: M70.101 (ICD-10-CM) - Weakness of both  lower extremities   THERAPY DIAG:  No diagnosis found.  Rationale for Evaluation and Treatment: Rehabilitation  SUBJECTIVE:                                                                                                                                                                                             SUBJECTIVE STATEMENT:  Pt reports doing well today. Pt denies any recent falls/stumbles since prior session. Pt denies any updates to medications or medical appointment since prior session. Did his PD HEP and had a cookout with his Rock steady group this weekend   Pt accompanied by: significant other  PERTINENT HISTORY:  From prior PT  evaluation:  PD, history of COVID and significant weakness present following this diagnosis and the use of paxlovid ( August 5 dx date, thinks Paxlovid may have exacerbated PD symptoms)    Pt reports having COVID and having significant weakness.  Patient reports he also has back pain that is not present for a long time but was exacerbated more recently.  Patient previously did physical therapy for his back and experienced some relief but has not been consistent with the exercises.  Patient also has history of going to Parkinson's rock steady classes multiple times per week prior to onset of his COVID but he has not been back since. Patient previously ambulated without an assistive device but is now ambulating with a straight point cane.  Patient reports increased foot and ankle weakness in comparison with his hips and knees.  Patient also reports low back pain that is exacerbated with prolonged standing.  PAIN:  Are you having pain? No  PRECAUTIONS: Fall  RED FLAGS: None   WEIGHT BEARING RESTRICTIONS: No  FALLS: Has patient fallen in last 6 months? Yes. Number of falls 6  LIVING ENVIRONMENT: Lives with: lives with their spouse Lives in: House/apartment Stairs: Yes: Internal: 15 steps; on left going up and External: 1 steps; none Has following equipment at home: Quad cane large base and Walker - 2 wheeled  PLOF: Independent  PATIENT GOALS: to improve overall strength, get back to walking more regularly  OBJECTIVE:  Note: Objective measures were completed at Evaluation unless otherwise noted.  DIAGNOSTIC FINDINGS:   EXAM: CT HEAD WITHOUT CONTRAST  IMPRESSION: 1. No acute intracranial process. 2. Mild chronic small vessel ischemic changes. 3. Acute right frontal and maxillary sinusitis.  COGNITION: Overall cognitive status: Within functional limits for tasks assessed   SENSATION: WFL  COORDINATION: WFL    LOWER EXTREMITY ROM:     Active  Right Eval Left Eval  Hip  flexion    Hip extension  Hip abduction    Hip adduction    Hip internal rotation    Hip external rotation    Knee flexion    Knee extension    Ankle dorsiflexion    Ankle plantarflexion    Ankle inversion    Ankle eversion     (Blank rows = not tested)  LOWER EXTREMITY MMT:    MMT Right Eval Left Eval  Hip flexion 4 4  Hip abduction 4 4  Hip adduction 4 4  Knee flexion 3+ 3+  Knee extension 4- 4-  Ankle dorsiflexion 4+ 4  (Blank rows = not tested)  BED MOBILITY:  No limitations according to the pt.  TRANSFERS: Assistive device utilized: None  Sit to stand: Complete Independence Stand to sit: Complete Independence Chair to chair: Complete Independence Floor: Not tested  FUNCTIONAL TESTS:  5 times sit to stand: 15.03 sec Timed up and go (TUG): 13.38 sec 6 minute walk test: TBD 10 meter walk test: 12.15 sec; 0.82 m/s Dynamic Gait Index: TBD  PATIENT SURVEYS:  ABC scale 70.6%                                                                                                                              TREATMENT DATE: 02/01/24  TA and NMR  Level 4 octane UE and LE reciprocal movement training x 6 min   PT applied 3# AW and 1.5# WW at start of treatment as well as gait belt, which remained in place throughout entire treatment. CGA/supervision assist for safety unless otherwise noted   Standing PWR! Moves on Blue pad x 10 ea of UP, rock, twist, step Gait with arm swing focus x 450 ft, rest then another 450 ft  Standing forward arm swing and step ( opposite sides) 2 x 10 ea LE  Standing on incline board 2 x 12 reps of heel raises without UE assist.  PWR! Up targets postural strengthening and antigravity extension, PRW! Rock targets functional weight shifting, PRW! Twist targets trunk rotation and PRW! Step targets transition movements. PWR! Moves target bradykinesia, rigidity, and dyskinesia through targeted functional movements that address four core movement  difficulties for people with Parkinson's disease.  Unless otherwise stated, CGA was provided and gait belt donned in order to ensure pt safety   PATIENT EDUCATION: Education details: Pt educated throughout session about proper posture and technique with exercises. Improved exercise technique, movement at target joints, use of target muscles after min to mod verbal, visual, tactile cues  Person educated: Patient and Spouse Education method: Explanation, Demonstration, and Verbal cues Education comprehension: verbalized understanding  HOME EXERCISE PROGRAM: Seated PWR! Moves x 10 ea, handout provided   GOALS: Goals reviewed with patient? Yes  SHORT TERM GOALS: Target date: 09/14/2023    Pt will be independent with HEP in order to demonstrate increased ability to perform tasks related to occupation/hobbies. Baseline:  Pt to be given HEP at next visit. 7/31: has PWR! Activities,  has not completed since fall and shoulder injury   Goal status: ONGOING  LONG TERM GOALS: Target date: 03/03/2024    1.  Patient (> 27 years old) will complete five times sit to stand test in < 15 seconds indicating an increased LE strength and improved balance. Baseline: 15.03 sec 4/14: 11.79 sec  5/29: 11.06 sec Goal status: MET  2.  Patient will increase their ABC scale score to be 80.6% (an increase of 10 points), demonstrating improved balance confidence and reduced fall risk, as measured through weekly assessments. Baseline: 70.6% 4/14:72 % 5/29: 74% 01/07/24: 90% Goal status: ONGOING   3.  Patient will increase Berg Balance score by > 6 points to demonstrate decreased fall risk during functional activities. Baseline: 43 on 3/13 4/14: 49 5/29: 56 Goal status: MET   4.  Patient will reduce timed up and go to <11 seconds to reduce fall risk and demonstrate improved transfer/gait ability. Baseline: 13.38 sec 4/14: 11.4 sec no AD 5/29: 10.78 sec Goal status: MET  5.  Patient will increase 10 meter walk  test to >1.71m/s as to improve gait speed for better community ambulation and to reduce fall risk. Baseline: 12.15 sec; 0.82 m/s 5/29: 1.06 m/s Goal status: MET  6.  Patient will increase six minute walk test distance to >1000 for progression to community ambulator and improve gait ability Baseline: 459 ft with RW stopping at 5:30 due to fatigue  09/21/23:1128 ft with 4WW  Goal status: MET  7.  Patient will increased Minibest by at least 3 points  to reduce fall risk and demonstrate improved transfer/gait ability. Baseline: 11/09/23: 22   7/31:  Goal status: ONGOING.   8.  Patient will be independent with gym-based exercise program that he can complete 2-3 times weekly to build his lower extremity strength and postural strength Baseline: no gym program, wants to continue to progress leg strength  Goal status: ONGOING   ASSESSMENT:  CLINICAL IMPRESSION:  Patient presents to PT motivated to participate. Patient continues with Parkinson's based exercise program as well as exercises to improve his lower extremity strength and functional mobility.  High volume interval gait training with AW and WW improved motor recruitment and improve amplitude of movement through BUE and BLE with instruction to maintain reciprocal gait pattern with Ues. Pt will continue to benefit from skilled physical therapy intervention to address impairments, improve QOL, and attain therapy goals.     OBJECTIVE IMPAIRMENTS: Abnormal gait, decreased activity tolerance, decreased balance, decreased endurance, decreased knowledge of use of DME, decreased mobility, difficulty walking, decreased ROM, decreased strength, and decreased safety awareness.   ACTIVITY LIMITATIONS: carrying, lifting, bending, standing, squatting, bathing, and locomotion level  PARTICIPATION LIMITATIONS: cleaning, laundry, driving, shopping, community activity, and yard work  PERSONAL FACTORS: Age, Education, Past/current experiences, Time since onset  of injury/illness/exacerbation, and 3+ comorbidities: orthostatic HTN, syncope, falls, anemia, insomnia, dyspnea are also affecting patient's functional outcome.   REHAB POTENTIAL: Fair pt has been seen in the past.  CLINICAL DECISION MAKING: Evolving/moderate complexity  EVALUATION COMPLEXITY: Moderate  PLAN:  PT FREQUENCY: 2x/week  PT DURATION: 8 weeks  PLANNED INTERVENTIONS: 97110-Therapeutic exercises, 97530- Therapeutic activity, 97112- Neuromuscular re-education, 97535- Self Care, [id number]- Manual therapy, [phone]- Gait training, Balance training, Stair training, and Vestibular training  PLAN FOR NEXT SESSION:  Balance and strength interventions PD specific interventions as indicated  Progress with gym based program   Lonni KATHEE Gainer PT ,DPT Physical Therapist- [place]  [facility]  11:33 AM 02/01/24

## 2024-02-04 ENCOUNTER — Ambulatory Visit: Admitting: Physical Therapy

## 2024-02-04 DIAGNOSIS — R2681 Unsteadiness on feet: Secondary | ICD-10-CM

## 2024-02-04 DIAGNOSIS — R262 Difficulty in walking, not elsewhere classified: Secondary | ICD-10-CM

## 2024-02-04 DIAGNOSIS — M5459 Other low back pain: Secondary | ICD-10-CM | POA: Diagnosis not present

## 2024-02-04 DIAGNOSIS — R2689 Other abnormalities of gait and mobility: Secondary | ICD-10-CM | POA: Diagnosis not present

## 2024-02-04 DIAGNOSIS — R269 Unspecified abnormalities of gait and mobility: Secondary | ICD-10-CM

## 2024-02-04 DIAGNOSIS — M6281 Muscle weakness (generalized): Secondary | ICD-10-CM | POA: Diagnosis not present

## 2024-02-04 NOTE — Therapy (Signed)
 OUTPATIENT PHYSICAL THERAPY NEURO TREATMENT   Patient Name: Johnathan Arnold. MRN: [id number] DOB:1946/03/25, 78 y.o., male 94 Date: 02/04/2024  PCP: Marylynn Verneita CROME, MD  REFERRING PROVIDER: Marylynn Verneita CROME, MD   END OF SESSION:  PT End of Session - 02/04/24 1149     Visit Number 37    Number of Visits 46   corrected   Date for PT Re-Evaluation 03/03/24   corrected   Progress Note Due on Visit 40    PT Start Time 1147    PT Stop Time 1226    PT Time Calculation (min) 39 min    Equipment Utilized During Treatment Gait belt    Activity Tolerance Patient tolerated treatment well    Behavior During Therapy [facility] for tasks assessed/performed                            Past Medical History:  Diagnosis Date   3-vessel coronary artery disease    s/p  5 vessel CABG   Allergy June 2024   Diabetes mellitus without complication (HCC)    History of cardiac catheterization 2011   [facility]   Hyperlipidemia    Hypertension    Hypertriglyceridemia    Neuromuscular disorder [facility]) October 2017   Diagonis Parkinson   Parkinson's disease [facility])    Pneumonia 12/28/2020   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 LIMA to LAD, SVG to D1, OM1 and RPDA   CORONARY ARTERY BYPASS GRAFT  03/1998   5 vessel, [facility]   RIGHT HEART CATH N/A 04/04/2019   Procedure: RIGHT HEART CATH;  Surgeon: Darron Deatrice LABOR, MD;  Location: [facility] INVASIVE CV LAB;  Service: Cardiovascular;  Laterality: N/A;   RIGHT/LEFT HEART CATH AND CORONARY ANGIOGRAPHY N/A 02/01/2018   Procedure: RIGHT/LEFT HEART CATH AND CORONARY ANGIOGRAPHY;  Surgeon: Darron Deatrice LABOR, MD;  Location: [facility] INVASIVE CV LAB;  Service: Cardiovascular;  Laterality: N/A;   Patient Active Problem List   Diagnosis Date Noted   Diverticulitis 07/26/2023   RSV (respiratory syncytial virus pneumonia) 07/26/2023    Impaired ambulation 07/02/2023   Orthostatic hypotension 05/22/2023   Generalized weakness 05/07/2021   Bradycardia    Anemia, unspecified 02/07/2021   Neutropenia (HCC) 01/29/2021   Thrombocytopenia (HCC) 01/29/2021   Right lower lobe pneumonia 12/28/2020   Hyponatremia 12/22/2020   Bilateral leg weakness 12/20/2020   Prostate cancer screening 09/08/2020   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) 09/06/2020   Low back pain 08/19/2019   Pulmonary hypertension (HCC)    Insomnia 12/21/2018   Sleep apnea in adult 12/09/2018   Periodic limb movement disorder 12/09/2018   Pulmonary nodules 05/18/2018   Wears hearing aid in both ears 05/17/2018   Leg pain, bilateral 02/20/2018   Dyspnea    CKD stage 3a, GFR 45-59 ml/min (HCC) 09/21/2017   History of skin cancer in adulthood 08/14/2016   Parkinson's disease (HCC) 08/09/2016   Bilateral carotid artery stenosis 04/02/2015   Vertigo, peripheral 10/17/2014   Benign prostatic hyperplasia with urinary frequency 01/31/2014   Obesity 04/03/2013   Other malaise and fatigue 09/21/2012   Hyperlipidemia    Essential hypertension    3-vessel coronary artery disease    S/P CABG x 5     ONSET DATE: 07/03/23  REFERRING DIAG: M70.101 (ICD-10-CM) - Weakness of both  lower extremities   THERAPY DIAG:  Unsteadiness on feet  Abnormality of gait and mobility  Difficulty in walking, not elsewhere classified  Other abnormalities of gait and mobility  Rationale for Evaluation and Treatment: Rehabilitation  SUBJECTIVE:                                                                                                                                                                                             SUBJECTIVE STATEMENT:  Pt reports doing well today. Pt denies any recent falls/stumbles since prior session. Pt denies any updates to medications or medical appointment since prior session. A little tired today.    Pt  accompanied by: significant other  PERTINENT HISTORY:  From prior PT evaluation:  PD, history of COVID and significant weakness present following this diagnosis and the use of paxlovid ( August 5 dx date, thinks Paxlovid may have exacerbated PD symptoms)    Pt reports having COVID and having significant weakness.  Patient reports he also has back pain that is not present for a long time but was exacerbated more recently.  Patient previously did physical therapy for his back and experienced some relief but has not been consistent with the exercises.  Patient also has history of going to Parkinson's rock steady classes multiple times per week prior to onset of his COVID but he has not been back since. Patient previously ambulated without an assistive device but is now ambulating with a straight point cane.  Patient reports increased foot and ankle weakness in comparison with his hips and knees.  Patient also reports low back pain that is exacerbated with prolonged standing.  PAIN:  Are you having pain? No  PRECAUTIONS: Fall  RED FLAGS: None   WEIGHT BEARING RESTRICTIONS: No  FALLS: Has patient fallen in last 6 months? Yes. Number of falls 6  LIVING ENVIRONMENT: Lives with: lives with their spouse Lives in: House/apartment Stairs: Yes: Internal: 15 steps; on left going up and External: 1 steps; none Has following equipment at home: Quad cane large base and Walker - 2 wheeled  PLOF: Independent  PATIENT GOALS: to improve overall strength, get back to walking more regularly  OBJECTIVE:  Note: Objective measures were completed at Evaluation unless otherwise noted.  DIAGNOSTIC FINDINGS:   EXAM: CT HEAD WITHOUT CONTRAST  IMPRESSION: 1. No acute intracranial process. 2. Mild chronic small vessel ischemic changes. 3. Acute right frontal and maxillary sinusitis.  COGNITION: Overall cognitive status: Within functional limits for tasks  assessed   SENSATION: WFL  COORDINATION: WFL    LOWER EXTREMITY ROM:     Active  Right Eval Left  Eval  Hip flexion    Hip extension    Hip abduction    Hip adduction    Hip internal rotation    Hip external rotation    Knee flexion    Knee extension    Ankle dorsiflexion    Ankle plantarflexion    Ankle inversion    Ankle eversion     (Blank rows = not tested)  LOWER EXTREMITY MMT:    MMT Right Eval Left Eval  Hip flexion 4 4  Hip abduction 4 4  Hip adduction 4 4  Knee flexion 3+ 3+  Knee extension 4- 4-  Ankle dorsiflexion 4+ 4  (Blank rows = not tested)  BED MOBILITY:  No limitations according to the pt.  TRANSFERS: Assistive device utilized: None  Sit to stand: Complete Independence Stand to sit: Complete Independence Chair to chair: Complete Independence Floor: Not tested  FUNCTIONAL TESTS:  5 times sit to stand: 15.03 sec Timed up and go (TUG): 13.38 sec 6 minute walk test: TBD 10 meter walk test: 12.15 sec; 0.82 m/s Dynamic Gait Index: TBD  PATIENT SURVEYS:  ABC scale 70.6%                                                                                                                              TREATMENT DATE: 02/04/24  TA and NMR  Level 5 octane UE and LE reciprocal movement training x 6 min   PT applied 3# AW and 1.5# WW at start of treatment as well as gait belt, which remained in place throughout entire treatment. CGA/supervision assist for safety unless otherwise noted   Standing PWR! Moves on Blue pad x 10 ea of UP, rock, twist, step Gait with arm swing focus x 650 ft including outdoor gait and some inclines and declines, rest then another 650 ft of the same   PWR! Up targets postural strengthening and antigravity extension, PRW! Rock targets functional weight shifting, PRW! Twist targets trunk rotation and PRW! Step targets transition movements. PWR! Moves target bradykinesia, rigidity, and dyskinesia through targeted functional  movements that address four core movement difficulties for people with Parkinson's disease.  TE- To improve strength, endurance, mobility, and function of specific targeted muscle groups or improve joint range of motion or improve muscle flexibility  TB rows in seated 2 x 15 reps with GTB TB HA ABD 2 x 15 with RTB  TB Bil ER with RTB 2 x 10   Unless otherwise stated, CGA was provided and gait belt donned in order to ensure pt safety   PATIENT EDUCATION: Education details: Pt educated throughout session about proper posture and technique with exercises. Improved exercise technique, movement at target joints, use of target muscles after min to mod verbal, visual, tactile cues  Person educated: Patient and Spouse Education method: Explanation, Demonstration, and Verbal cues Education comprehension: verbalized understanding  HOME EXERCISE PROGRAM: Seated PWR! Moves x 10 ea, handout provided   GOALS:  Goals reviewed with patient? Yes  SHORT TERM GOALS: Target date: 09/14/2023    Pt will be independent with HEP in order to demonstrate increased ability to perform tasks related to occupation/hobbies. Baseline:  Pt to be given HEP at next visit. 7/31: has PWR! Activities, has not completed since fall and shoulder injury   Goal status: ONGOING  LONG TERM GOALS: Target date: 03/03/2024    1.  Patient (> 67 years old) will complete five times sit to stand test in < 15 seconds indicating an increased LE strength and improved balance. Baseline: 15.03 sec 4/14: 11.79 sec  5/29: 11.06 sec Goal status: MET  2.  Patient will increase their ABC scale score to be 80.6% (an increase of 10 points), demonstrating improved balance confidence and reduced fall risk, as measured through weekly assessments. Baseline: 70.6% 4/14:72 % 5/29: 74% 01/07/24: 90% Goal status: ONGOING   3.  Patient will increase Berg Balance score by > 6 points to demonstrate decreased fall risk during functional  activities. Baseline: 43 on 3/13 4/14: 49 5/29: 56 Goal status: MET   4.  Patient will reduce timed up and go to <11 seconds to reduce fall risk and demonstrate improved transfer/gait ability. Baseline: 13.38 sec 4/14: 11.4 sec no AD 5/29: 10.78 sec Goal status: MET  5.  Patient will increase 10 meter walk test to >1.76m/s as to improve gait speed for better community ambulation and to reduce fall risk. Baseline: 12.15 sec; 0.82 m/s 5/29: 1.06 m/s Goal status: MET  6.  Patient will increase six minute walk test distance to >1000 for progression to community ambulator and improve gait ability Baseline: 459 ft with RW stopping at 5:30 due to fatigue  09/21/23:1128 ft with 4WW  Goal status: MET  7.  Patient will increased Minibest by at least 3 points  to reduce fall risk and demonstrate improved transfer/gait ability. Baseline: 11/09/23: 22   7/31:  Goal status: ONGOING.   8.  Patient will be independent with gym-based exercise program that he can complete 2-3 times weekly to build his lower extremity strength and postural strength Baseline: no gym program, wants to continue to progress leg strength  Goal status: ONGOING   ASSESSMENT:  CLINICAL IMPRESSION:  Patient presents to PT motivated to participate. Patient continues with Parkinson's based exercise program as well as exercises to improve his lower extremity strength and functional mobility.  High volume interval gait training with AW and WW improved motor recruitment and improve amplitude of movement through BUE and BLE with instruction to maintain reciprocal gait pattern with UEs. Pt will continue to benefit from skilled physical therapy intervention to address impairments, improve QOL, and attain therapy goals.   OBJECTIVE IMPAIRMENTS: Abnormal gait, decreased activity tolerance, decreased balance, decreased endurance, decreased knowledge of use of DME, decreased mobility, difficulty walking, decreased ROM, decreased strength, and  decreased safety awareness.   ACTIVITY LIMITATIONS: carrying, lifting, bending, standing, squatting, bathing, and locomotion level  PARTICIPATION LIMITATIONS: cleaning, laundry, driving, shopping, community activity, and yard work  PERSONAL FACTORS: Age, Education, Past/current experiences, Time since onset of injury/illness/exacerbation, and 3+ comorbidities: orthostatic HTN, syncope, falls, anemia, insomnia, dyspnea are also affecting patient's functional outcome.   REHAB POTENTIAL: Fair pt has been seen in the past.  CLINICAL DECISION MAKING: Evolving/moderate complexity  EVALUATION COMPLEXITY: Moderate  PLAN:  PT FREQUENCY: 2x/week  PT DURATION: 8 weeks  PLANNED INTERVENTIONS: 97110-Therapeutic exercises, 97530- Therapeutic activity, [id number]- Neuromuscular re-education, 97535- Self Care, [id number]- Manual therapy, [id number]- Gait training, Balance  training, Stair training, and Vestibular training  PLAN FOR NEXT SESSION:  Balance and strength interventions PD specific interventions as indicated  Progress with gym based program   Lonni KATHEE Gainer PT ,DPT Physical Therapist- [facility]  [facility]   12:35 PM 02/04/24

## 2024-02-11 ENCOUNTER — Ambulatory Visit: Attending: Internal Medicine | Admitting: Physical Therapy

## 2024-02-11 ENCOUNTER — Encounter: Admitting: Occupational Therapy

## 2024-02-11 DIAGNOSIS — M6281 Muscle weakness (generalized): Secondary | ICD-10-CM | POA: Diagnosis present

## 2024-02-11 DIAGNOSIS — R2681 Unsteadiness on feet: Secondary | ICD-10-CM | POA: Insufficient documentation

## 2024-02-11 DIAGNOSIS — R2689 Other abnormalities of gait and mobility: Secondary | ICD-10-CM | POA: Insufficient documentation

## 2024-02-11 DIAGNOSIS — R269 Unspecified abnormalities of gait and mobility: Secondary | ICD-10-CM | POA: Insufficient documentation

## 2024-02-11 DIAGNOSIS — R262 Difficulty in walking, not elsewhere classified: Secondary | ICD-10-CM | POA: Insufficient documentation

## 2024-02-11 NOTE — Therapy (Signed)
 OUTPATIENT PHYSICAL THERAPY NEURO TREATMENT   Patient Name: Johnathan Arnold. MRN: [id number] DOB:[birth year], 78 y.o., male Today's Date: 02/11/2024  PCP: Marylynn Verneita CROME, MD  REFERRING PROVIDER: Marylynn Verneita CROME, MD   END OF SESSION:  PT End of Session - 02/11/24 1057     Visit Number 38    Number of Visits 46   corrected   Date for PT Re-Evaluation 03/03/24   corrected   Progress Note Due on Visit 40    PT Start Time 1100    PT Stop Time 1140    PT Time Calculation (min) 40 min    Equipment Utilized During Treatment Gait belt    Activity Tolerance Patient tolerated treatment well    Behavior During Therapy [facility] for tasks assessed/performed                             Past Medical History:  Diagnosis Date   3-vessel coronary artery disease    s/p  5 vessel CABG   Allergy June 2024   Diabetes mellitus without complication (HCC)    History of cardiac catheterization 2011   [facility]   Hyperlipidemia    Hypertension    Hypertriglyceridemia    Neuromuscular disorder [facility]) October 2017   Diagonis Parkinson   Parkinson's disease [facility])    Pneumonia 12/28/2020   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 LIMA to LAD, SVG to D1, OM1 and RPDA   CORONARY ARTERY BYPASS GRAFT  03/1998   5 vessel, [facility]   RIGHT HEART CATH N/A 04/04/2019   Procedure: RIGHT HEART CATH;  Surgeon: Darron Deatrice LABOR, MD;  Location: [facility] INVASIVE CV LAB;  Service: Cardiovascular;  Laterality: N/A;   RIGHT/LEFT HEART CATH AND CORONARY ANGIOGRAPHY N/A 02/01/2018   Procedure: RIGHT/LEFT HEART CATH AND CORONARY ANGIOGRAPHY;  Surgeon: Darron Deatrice LABOR, MD;  Location: [facility] INVASIVE CV LAB;  Service: Cardiovascular;  Laterality: N/A;   Patient Active Problem List   Diagnosis Date Noted   Diverticulitis 07/26/2023   RSV (respiratory syncytial virus pneumonia) 07/26/2023    Impaired ambulation 07/02/2023   Orthostatic hypotension 05/22/2023   Generalized weakness 05/07/2021   Bradycardia    Anemia, unspecified 02/07/2021   Neutropenia (HCC) 01/29/2021   Thrombocytopenia (HCC) 01/29/2021   Right lower lobe pneumonia 12/28/2020   Hyponatremia 12/22/2020   Bilateral leg weakness 12/20/2020   Prostate cancer screening 09/08/2020   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) 09/06/2020   Low back pain 08/19/2019   Pulmonary hypertension (HCC)    Insomnia 12/21/2018   Sleep apnea in adult 12/09/2018   Periodic limb movement disorder 12/09/2018   Pulmonary nodules 05/18/2018   Wears hearing aid in both ears 05/17/2018   Leg pain, bilateral 02/20/2018   Dyspnea    CKD stage 3a, GFR 45-59 ml/min (HCC) 09/21/2017   History of skin cancer in adulthood 08/14/2016   Parkinson's disease (HCC) 08/09/2016   Bilateral carotid artery stenosis 04/02/2015   Vertigo, peripheral 10/17/2014   Benign prostatic hyperplasia with urinary frequency 01/31/2014   Obesity 04/03/2013   Other malaise and fatigue 09/21/2012   Hyperlipidemia    Essential hypertension    3-vessel coronary artery disease    S/P CABG x 5     ONSET DATE: 07/03/23  REFERRING DIAG: M70.101 (ICD-10-CM) - Weakness of  both lower extremities   THERAPY DIAG:  Abnormality of gait and mobility  Difficulty in walking, not elsewhere classified  Other abnormalities of gait and mobility  Muscle weakness (generalized)  Rationale for Evaluation and Treatment: Rehabilitation  SUBJECTIVE:                                                                                                                                                                                             SUBJECTIVE STATEMENT:  Pt reports doing well today. Pt denies any recent falls/stumbles since prior session. Pt denies any updates to medications or medical appointment since prior session. Pt had quiet labor day  weekend.    Pt accompanied by: significant other  PERTINENT HISTORY:  From prior PT evaluation:  PD, history of COVID and significant weakness present following this diagnosis and the use of paxlovid ( August 5 dx date, thinks Paxlovid may have exacerbated PD symptoms)    Pt reports having COVID and having significant weakness.  Patient reports he also has back pain that is not present for a long time but was exacerbated more recently.  Patient previously did physical therapy for his back and experienced some relief but has not been consistent with the exercises.  Patient also has history of going to Parkinson's rock steady classes multiple times per week prior to onset of his COVID but he has not been back since. Patient previously ambulated without an assistive device but is now ambulating with a straight point cane.  Patient reports increased foot and ankle weakness in comparison with his hips and knees.  Patient also reports low back pain that is exacerbated with prolonged standing.  PAIN:  Are you having pain? No  PRECAUTIONS: Fall  RED FLAGS: None   WEIGHT BEARING RESTRICTIONS: No  FALLS: Has patient fallen in last 6 months? Yes. Number of falls 6  LIVING ENVIRONMENT: Lives with: lives with their spouse Lives in: House/apartment Stairs: Yes: Internal: 15 steps; on left going up and External: 1 steps; none Has following equipment at home: Quad cane large base and Walker - 2 wheeled  PLOF: Independent  PATIENT GOALS: to improve overall strength, get back to walking more regularly  OBJECTIVE:  Note: Objective measures were completed at Evaluation unless otherwise noted.  DIAGNOSTIC FINDINGS:   EXAM: CT HEAD WITHOUT CONTRAST  IMPRESSION: 1. No acute intracranial process. 2. Mild chronic small vessel ischemic changes. 3. Acute right frontal and maxillary sinusitis.  COGNITION: Overall cognitive status: Within functional limits for tasks  assessed   SENSATION: WFL  COORDINATION: WFL    LOWER EXTREMITY ROM:     Active  Right Eval Left Eval  Hip flexion    Hip extension    Hip abduction    Hip adduction    Hip internal rotation    Hip external rotation    Knee flexion    Knee extension    Ankle dorsiflexion    Ankle plantarflexion    Ankle inversion    Ankle eversion     (Blank rows = not tested)  LOWER EXTREMITY MMT:    MMT Right Eval Left Eval  Hip flexion 4 4  Hip abduction 4 4  Hip adduction 4 4  Knee flexion 3+ 3+  Knee extension 4- 4-  Ankle dorsiflexion 4+ 4  (Blank rows = not tested)  BED MOBILITY:  No limitations according to the pt.  TRANSFERS: Assistive device utilized: None  Sit to stand: Complete Independence Stand to sit: Complete Independence Chair to chair: Complete Independence Floor: Not tested  FUNCTIONAL TESTS:  5 times sit to stand: 15.03 sec Timed up and go (TUG): 13.38 sec 6 minute walk test: TBD 10 meter walk test: 12.15 sec; 0.82 m/s Dynamic Gait Index: TBD  PATIENT SURVEYS:  ABC scale 70.6%                                                                                                                              TREATMENT DATE: 02/11/24  TA and NMR  Level 5 octane UE and LE reciprocal movement training x 6 min   PT applied 3# AW and 1.5# WW at start of treatment as well as gait belt, which remained in place throughout entire treatment. CGA/supervision assist for safety unless otherwise noted   Standing forward step and arm swing with concurrent step 2 x 15 reps   Gait with reciprocal UE and LE movements x 330 ft   Boxing alternating reciprocal UE and LE movements 3 x 10 reps ea UE/LE combo  Standing PWR! Moves on Blue pad x 10 ea of step  Standing on BOSU hard side x 10 or PWR! Up and rock, high difficulty maintaining balance   PWR! Up targets postural strengthening and antigravity extension, PRW! Rock targets functional weight shifting, PRW!  Twist targets trunk rotation and PRW! Step targets transition movements. PWR! Moves target bradykinesia, rigidity, and dyskinesia through targeted functional movements that address four core movement difficulties for people with Parkinson's disease.   PATIENT EDUCATION: Education details: Pt educated throughout session about proper posture and technique with exercises. Improved exercise technique, movement at target joints, use of target muscles after min to mod verbal, visual, tactile cues  Person educated: Patient and Spouse Education method: Explanation, Demonstration, and Verbal cues Education comprehension: verbalized understanding  HOME EXERCISE PROGRAM: Seated PWR! Moves x 10 ea, handout provided   GOALS: Goals reviewed with patient? Yes  SHORT TERM GOALS: Target date: 09/14/2023    Pt will be independent with HEP in order to demonstrate increased ability to perform tasks related to occupation/hobbies. Baseline:  Pt to be given HEP at next visit. 7/31: has PWR! Activities, has not completed since fall and shoulder injury   Goal status: ONGOING  LONG TERM GOALS: Target date: 03/03/2024    1.  Patient (> 68 years old) will complete five times sit to stand test in < 15 seconds indicating an increased LE strength and improved balance. Baseline: 15.03 sec 4/14: 11.79 sec  5/29: 11.06 sec Goal status: MET  2.  Patient will increase their ABC scale score to be 80.6% (an increase of 10 points), demonstrating improved balance confidence and reduced fall risk, as measured through weekly assessments. Baseline: 70.6% 4/14:72 % 5/29: 74% 01/07/24: 90% Goal status: ONGOING   3.  Patient will increase Berg Balance score by > 6 points to demonstrate decreased fall risk during functional activities. Baseline: 43 on 3/13 4/14: 49 5/29: 56 Goal status: MET   4.  Patient will reduce timed up and go to <11 seconds to reduce fall risk and demonstrate improved transfer/gait ability. Baseline:  13.38 sec 4/14: 11.4 sec no AD 5/29: 10.78 sec Goal status: MET  5.  Patient will increase 10 meter walk test to >1.12m/s as to improve gait speed for better community ambulation and to reduce fall risk. Baseline: 12.15 sec; 0.82 m/s 5/29: 1.06 m/s Goal status: MET  6.  Patient will increase six minute walk test distance to >1000 for progression to community ambulator and improve gait ability Baseline: 459 ft with RW stopping at 5:30 due to fatigue  09/21/23:1128 ft with 4WW  Goal status: MET  7.  Patient will increased Minibest by at least 3 points  to reduce fall risk and demonstrate improved transfer/gait ability. Baseline: 11/09/23: 22   7/31:  Goal status: ONGOING.   8.  Patient will be independent with gym-based exercise program that he can complete 2-3 times weekly to build his lower extremity strength and postural strength Baseline: no gym program, wants to continue to progress leg strength  Goal status: ONGOING   ASSESSMENT:  CLINICAL IMPRESSION:  Patient presents to PT motivated to participate. Patient continues with Parkinson's based exercise program as well as exercises to improve his lower extremity strength and functional mobility.  Continued with interventions that focussed on incorporating UE and LE reciprocal movements.  Pt will continue to benefit from skilled physical therapy intervention to address impairments, improve QOL, and attain therapy goals.   OBJECTIVE IMPAIRMENTS: Abnormal gait, decreased activity tolerance, decreased balance, decreased endurance, decreased knowledge of use of DME, decreased mobility, difficulty walking, decreased ROM, decreased strength, and decreased safety awareness.   ACTIVITY LIMITATIONS: carrying, lifting, bending, standing, squatting, bathing, and locomotion level  PARTICIPATION LIMITATIONS: cleaning, laundry, driving, shopping, community activity, and yard work  PERSONAL FACTORS: Age, Education, Past/current experiences, Time since  onset of injury/illness/exacerbation, and 3+ comorbidities: orthostatic HTN, syncope, falls, anemia, insomnia, dyspnea are also affecting patient's functional outcome.   REHAB POTENTIAL: Fair pt has been seen in the past.  CLINICAL DECISION MAKING: Evolving/moderate complexity  EVALUATION COMPLEXITY: Moderate  PLAN:  PT FREQUENCY: 2x/week  PT DURATION: 8 weeks  PLANNED INTERVENTIONS: 97110-Therapeutic exercises, 97530- Therapeutic activity, 97112- Neuromuscular re-education, 97535- Self Care, [id number]- Manual therapy, [phone]- Gait training, Balance training, Stair training, and Vestibular training  PLAN FOR NEXT SESSION:  Balance and strength interventions PD specific interventions as indicated  Progress with gym based program   Lonni KATHEE Gainer PT ,DPT Physical Therapist- [facility]  [facility]   10:58 AM 02/11/24

## 2024-02-15 ENCOUNTER — Encounter: Admitting: Occupational Therapy

## 2024-02-15 ENCOUNTER — Ambulatory Visit: Admitting: Physical Therapy

## 2024-02-15 DIAGNOSIS — R2689 Other abnormalities of gait and mobility: Secondary | ICD-10-CM

## 2024-02-15 DIAGNOSIS — R269 Unspecified abnormalities of gait and mobility: Secondary | ICD-10-CM

## 2024-02-15 DIAGNOSIS — R2681 Unsteadiness on feet: Secondary | ICD-10-CM | POA: Diagnosis not present

## 2024-02-15 DIAGNOSIS — R262 Difficulty in walking, not elsewhere classified: Secondary | ICD-10-CM

## 2024-02-15 DIAGNOSIS — M6281 Muscle weakness (generalized): Secondary | ICD-10-CM

## 2024-02-15 NOTE — Therapy (Signed)
 OUTPATIENT PHYSICAL THERAPY NEURO TREATMENT   Patient Name: Johnathan Arnold. MRN: [id number] DOB:Jan 14, 1946, 78 y.o., male Today's Date: 02/15/2024  PCP: Marylynn Verneita CROME, MD  REFERRING PROVIDER: Marylynn Verneita CROME, MD   END OF SESSION:  PT End of Session - 02/15/24 1139     Visit Number 39    Number of Visits 46   corrected   Date for PT Re-Evaluation 03/03/24   corrected   Progress Note Due on Visit 40    PT Start Time 1100    PT Stop Time 1142    PT Time Calculation (min) 42 min    Equipment Utilized During Treatment Gait belt    Activity Tolerance Patient tolerated treatment well    Behavior During Therapy [facility] for tasks assessed/performed                             Past Medical History:  Diagnosis Date   3-vessel coronary artery disease    s/p  5 vessel CABG   Allergy June 2024   Diabetes mellitus without complication (HCC)    History of cardiac catheterization 2011   [facility]   Hyperlipidemia    Hypertension    Hypertriglyceridemia    Neuromuscular disorder [facility]) October 2017   Diagonis Parkinson   Parkinson's disease [facility])    Pneumonia 12/28/2020   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 LIMA to LAD, SVG to D1, OM1 and RPDA   CORONARY ARTERY BYPASS GRAFT  03/1998   [facility]   RIGHT HEART CATH N/A 04/04/2019   Procedure: RIGHT HEART CATH;  Surgeon: Darron Deatrice LABOR, MD;  Location: [facility] INVASIVE CV LAB;  Service: Cardiovascular;  Laterality: N/A;   RIGHT/LEFT HEART CATH AND CORONARY ANGIOGRAPHY N/A 02/01/2018   Procedure: RIGHT/LEFT HEART CATH AND CORONARY ANGIOGRAPHY;  Surgeon: Darron Deatrice LABOR, MD;  Location: [facility] INVASIVE CV LAB;  Service: Cardiovascular;  Laterality: N/A;   Patient Active Problem List   Diagnosis Date Noted   Diverticulitis 07/26/2023   RSV (respiratory syncytial virus pneumonia) 07/26/2023    Impaired ambulation 07/02/2023   Orthostatic hypotension 05/22/2023   Generalized weakness 05/07/2021   Bradycardia    Anemia, unspecified 02/07/2021   Neutropenia (HCC) 01/29/2021   Thrombocytopenia (HCC) 01/29/2021   Right lower lobe pneumonia 12/28/2020   Hyponatremia 12/22/2020   Bilateral leg weakness 12/20/2020   Prostate cancer screening 09/08/2020   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) 09/06/2020   Low back pain 08/19/2019   Pulmonary hypertension (HCC)    Insomnia 12/21/2018   Sleep apnea in adult 12/09/2018   Periodic limb movement disorder 12/09/2018   Pulmonary nodules 05/18/2018   Wears hearing aid in both ears 05/17/2018   Leg pain, bilateral 02/20/2018   Dyspnea    CKD stage 3a, GFR 45-59 ml/min (HCC) 09/21/2017   History of skin cancer in adulthood 08/14/2016   Parkinson's disease (HCC) 08/09/2016   Bilateral carotid artery stenosis 04/02/2015   Vertigo, peripheral 10/17/2014   Benign prostatic hyperplasia with urinary frequency 01/31/2014   Obesity 04/03/2013   Other malaise and fatigue 09/21/2012   Hyperlipidemia    Essential hypertension    3-vessel coronary artery disease    S/P CABG x 5     ONSET DATE: 07/03/23  REFERRING DIAG: M70.101 (ICD-10-CM) - Weakness of  both lower extremities   THERAPY DIAG:  Abnormality of gait and mobility  Difficulty in walking, not elsewhere classified  Other abnormalities of gait and mobility  Muscle weakness (generalized)  Unsteadiness on feet  Rationale for Evaluation and Treatment: Rehabilitation  SUBJECTIVE:                                                                                                                                                                                             SUBJECTIVE STATEMENT:  Pt reports doing well today. Pt denies any recent falls/stumbles since prior session. Pt denies any updates to medications or medical appointment since prior session.      Pt accompanied by: significant other  PERTINENT HISTORY:  From prior PT evaluation:  PD, history of COVID and significant weakness present following this diagnosis and the use of paxlovid ( August 5 dx date, thinks Paxlovid may have exacerbated PD symptoms)    Pt reports having COVID and having significant weakness.  Patient reports he also has back pain that is not present for a long time but was exacerbated more recently.  Patient previously did physical therapy for his back and experienced some relief but has not been consistent with the exercises.  Patient also has history of going to Parkinson's rock steady classes multiple times per week prior to onset of his COVID but he has not been back since. Patient previously ambulated without an assistive device but is now ambulating with a straight point cane.  Patient reports increased foot and ankle weakness in comparison with his hips and knees.  Patient also reports low back pain that is exacerbated with prolonged standing.  PAIN:  Are you having pain? No  PRECAUTIONS: Fall  RED FLAGS: None   WEIGHT BEARING RESTRICTIONS: No  FALLS: Has patient fallen in last 6 months? Yes. Number of falls 6  LIVING ENVIRONMENT: Lives with: lives with their spouse Lives in: House/apartment Stairs: Yes: Internal: 15 steps; on left going up and External: 1 steps; none Has following equipment at home: Quad cane large base and Walker - 2 wheeled  PLOF: Independent  PATIENT GOALS: to improve overall strength, get back to walking more regularly  OBJECTIVE:  Note: Objective measures were completed at Evaluation unless otherwise noted.  DIAGNOSTIC FINDINGS:   EXAM: CT HEAD WITHOUT CONTRAST  IMPRESSION: 1. No acute intracranial process. 2. Mild chronic small vessel ischemic changes. 3. Acute right frontal and maxillary sinusitis.  COGNITION: Overall cognitive status: Within functional limits for tasks  assessed   SENSATION: WFL  COORDINATION: WFL    LOWER EXTREMITY ROM:     Active  Right  Eval Left Eval  Hip flexion    Hip extension    Hip abduction    Hip adduction    Hip internal rotation    Hip external rotation    Knee flexion    Knee extension    Ankle dorsiflexion    Ankle plantarflexion    Ankle inversion    Ankle eversion     (Blank rows = not tested)  LOWER EXTREMITY MMT:    MMT Right Eval Left Eval  Hip flexion 4 4  Hip abduction 4 4  Hip adduction 4 4  Knee flexion 3+ 3+  Knee extension 4- 4-  Ankle dorsiflexion 4+ 4  (Blank rows = not tested)  BED MOBILITY:  No limitations according to the pt.  TRANSFERS: Assistive device utilized: None  Sit to stand: Complete Independence Stand to sit: Complete Independence Chair to chair: Complete Independence Floor: Not tested  FUNCTIONAL TESTS:  5 times sit to stand: 15.03 sec Timed up and go (TUG): 13.38 sec 6 minute walk test: TBD 10 meter walk test: 12.15 sec; 0.82 m/s Dynamic Gait Index: TBD  PATIENT SURVEYS:  ABC scale 70.6%                                                                                                                              TREATMENT DATE: 02/15/24  TA and NMR  Level 5 octane UE and LE reciprocal movement training x 6 min   All 4s PWR! Moves x 10 ea of up, rock, twist, and step   Gait with 3# AW and 1.5# wrist weights x 1200 ft with some outdoor walking, seated break requested upon start of shuffling gait. Then another 1000 ft back to clinic chair. Cues for arm swing motion reciprocal with LE movements.   Self care: Spoke to patient about progress note neck session.  Patient reports he feels like his balance is doing pretty well but he does still feel some right shoulder pain occasionally which he plans to speak to his doctor about.  Patient also still feels like his legs are weak at times and has difficulty with long distance gait at times.  Told patient the  benefit of physical therapy addressing some of these issues and the pain can be moving forward to address them following progress note neck session.   PWR! Up targets postural strengthening and antigravity extension, PRW! Rock targets functional weight shifting, PRW! Twist targets trunk rotation and PRW! Step targets transition movements. PWR! Moves target bradykinesia, rigidity, and dyskinesia through targeted functional movements that address four core movement difficulties for people with Parkinson's disease.   PATIENT EDUCATION: Education details: Pt educated throughout session about proper posture and technique with exercises. Improved exercise technique, movement at target joints, use of target muscles after min to mod verbal, visual, tactile cues  Person educated: Patient and Spouse Education method: Explanation, Demonstration, and Verbal cues Education comprehension: verbalized understanding  HOME EXERCISE PROGRAM: Seated PWR!  Moves x 10 ea, handout provided   GOALS: Goals reviewed with patient? Yes  SHORT TERM GOALS: Target date: 09/14/2023    Pt will be independent with HEP in order to demonstrate increased ability to perform tasks related to occupation/hobbies. Baseline:  Pt to be given HEP at next visit. 7/31: has PWR! Activities, has not completed since fall and shoulder injury   Goal status: ONGOING  LONG TERM GOALS: Target date: 03/03/2024    1.  Patient (> 55 years old) will complete five times sit to stand test in < 15 seconds indicating an increased LE strength and improved balance. Baseline: 15.03 sec 4/14: 11.79 sec  5/29: 11.06 sec Goal status: MET  2.  Patient will increase their ABC scale score to be 80.6% (an increase of 10 points), demonstrating improved balance confidence and reduced fall risk, as measured through weekly assessments. Baseline: 70.6% 4/14:72 % 5/29: 74% 01/07/24: 90% Goal status: ONGOING   3.  Patient will increase Berg Balance score by > 6  points to demonstrate decreased fall risk during functional activities. Baseline: 43 on 3/13 4/14: 49 5/29: 56 Goal status: MET   4.  Patient will reduce timed up and go to <11 seconds to reduce fall risk and demonstrate improved transfer/gait ability. Baseline: 13.38 sec 4/14: 11.4 sec no AD 5/29: 10.78 sec Goal status: MET  5.  Patient will increase 10 meter walk test to >1.3m/s as to improve gait speed for better community ambulation and to reduce fall risk. Baseline: 12.15 sec; 0.82 m/s 5/29: 1.06 m/s Goal status: MET  6.  Patient will increase six minute walk test distance to >1000 for progression to community ambulator and improve gait ability Baseline: 459 ft with RW stopping at 5:30 due to fatigue  09/21/23:1128 ft with 4WW  Goal status: MET  7.  Patient will increased Minibest by at least 3 points  to reduce fall risk and demonstrate improved transfer/gait ability. Baseline: 11/09/23: 22   7/31:  Goal status: ONGOING   8.  Patient will be independent with gym-based exercise program that he can complete 2-3 times weekly to build his lower extremity strength and postural strength Baseline: no gym program, wants to continue to progress leg strength  Goal status: ONGOING   ASSESSMENT:  CLINICAL IMPRESSION:  Patient presents to PT motivated to participate. Patient continues with Parkinson's based exercise program as well as exercises to improve his lower extremity strength and functional mobility.  Continued with interventions that focussed on incorporating UE and LE reciprocal movements.  Discussed progress note for next session and plans moving forward. Pt will continue to benefit from skilled physical therapy intervention to address impairments, improve QOL, and attain therapy goals.   OBJECTIVE IMPAIRMENTS: Abnormal gait, decreased activity tolerance, decreased balance, decreased endurance, decreased knowledge of use of DME, decreased mobility, difficulty walking, decreased ROM,  decreased strength, and decreased safety awareness.   ACTIVITY LIMITATIONS: carrying, lifting, bending, standing, squatting, bathing, and locomotion level  PARTICIPATION LIMITATIONS: cleaning, laundry, driving, shopping, community activity, and yard work  PERSONAL FACTORS: Age, Education, Past/current experiences, Time since onset of injury/illness/exacerbation, and 3+ comorbidities: orthostatic HTN, syncope, falls, anemia, insomnia, dyspnea are also affecting patient's functional outcome.   REHAB POTENTIAL: Fair pt has been seen in the past.  CLINICAL DECISION MAKING: Evolving/moderate complexity  EVALUATION COMPLEXITY: Moderate  PLAN:  PT FREQUENCY: 2x/week  PT DURATION: 8 weeks  PLANNED INTERVENTIONS: 97110-Therapeutic exercises, 97530- Therapeutic activity, [id number]- Neuromuscular re-education, 97535- Self Care, [id number]- Manual therapy, [id number]- Gait  training, Balance training, Stair training, and Vestibular training  PLAN FOR NEXT SESSION:  Balance and strength interventions PD specific interventions as indicated  Progress with gym based program   Lonni KATHEE Gainer PT ,DPT Physical Therapist- [facility]   11:40 AM 02/15/24

## 2024-02-18 ENCOUNTER — Ambulatory Visit: Admitting: Physical Therapy

## 2024-02-18 ENCOUNTER — Encounter: Admitting: Occupational Therapy

## 2024-02-18 DIAGNOSIS — R262 Difficulty in walking, not elsewhere classified: Secondary | ICD-10-CM

## 2024-02-18 DIAGNOSIS — R269 Unspecified abnormalities of gait and mobility: Secondary | ICD-10-CM

## 2024-02-18 DIAGNOSIS — R2681 Unsteadiness on feet: Secondary | ICD-10-CM

## 2024-02-18 DIAGNOSIS — R2689 Other abnormalities of gait and mobility: Secondary | ICD-10-CM

## 2024-02-18 DIAGNOSIS — M6281 Muscle weakness (generalized): Secondary | ICD-10-CM | POA: Diagnosis not present

## 2024-02-18 NOTE — Therapy (Signed)
 OUTPATIENT PHYSICAL THERAPY NEURO TREATMENT/Physical Therapy Progress Note  Dates of reporting period  01/07/24   to   02/18/24  Patient Name: Johnathan Arnold. MRN: [id number] DOB:31-Jan-1946, 78 y.o., male Today's Date: 02/18/2024  PCP: Marylynn Verneita CROME, MD  REFERRING PROVIDER: Marylynn Verneita CROME, MD   END OF SESSION:  PT End of Session - 02/18/24 1128     Visit Number 40    Number of Visits 46   corrected   Date for PT Re-Evaluation 03/03/24   corrected   Progress Note Due on Visit 40    PT Start Time 1100    PT Stop Time 1142    PT Time Calculation (min) 42 min    Equipment Utilized During Treatment Gait belt    Activity Tolerance Patient tolerated treatment well    Behavior During Therapy [facility] for tasks assessed/performed                              Past Medical History:  Diagnosis Date   3-vessel coronary artery disease    s/p  5 vessel CABG   Allergy June 2024   Diabetes mellitus without complication (HCC)    History of cardiac catheterization 2011   [facility]   Hyperlipidemia    Hypertension    Hypertriglyceridemia    Neuromuscular disorder [facility]) October 2017   Diagonis Parkinson   Parkinson's disease [facility])    Pneumonia 12/28/2020   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 LIMA to LAD, SVG to D1, OM1 and RPDA   CORONARY ARTERY BYPASS GRAFT  03/1998   5 vessel, [facility]   RIGHT HEART CATH N/A 04/04/2019   Procedure: RIGHT HEART CATH;  Surgeon: Darron Deatrice LABOR, MD;  Location: [facility] INVASIVE CV LAB;  Service: Cardiovascular;  Laterality: N/A;   RIGHT/LEFT HEART CATH AND CORONARY ANGIOGRAPHY N/A 02/01/2018   Procedure: RIGHT/LEFT HEART CATH AND CORONARY ANGIOGRAPHY;  Surgeon: Darron Deatrice LABOR, MD;  Location: [facility] INVASIVE CV LAB;  Service: Cardiovascular;  Laterality: N/A;   Patient Active Problem List   Diagnosis Date Noted    Diverticulitis 07/26/2023   RSV (respiratory syncytial virus pneumonia) 07/26/2023   Impaired ambulation 07/02/2023   Orthostatic hypotension 05/22/2023   Generalized weakness 05/07/2021   Bradycardia    Anemia, unspecified 02/07/2021   Neutropenia (HCC) 01/29/2021   Thrombocytopenia (HCC) 01/29/2021   Right lower lobe pneumonia 12/28/2020   Hyponatremia 12/22/2020   Bilateral leg weakness 12/20/2020   Prostate cancer screening 09/08/2020   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) 09/06/2020   Low back pain 08/19/2019   Pulmonary hypertension (HCC)    Insomnia 12/21/2018   Sleep apnea in adult 12/09/2018   Periodic limb movement disorder 12/09/2018   Pulmonary nodules 05/18/2018   Wears hearing aid in both ears 05/17/2018   Leg pain, bilateral 02/20/2018   Dyspnea    CKD stage 3a, GFR 45-59 ml/min (HCC) 09/21/2017   History of skin cancer in adulthood 08/14/2016   Parkinson's disease (HCC) 08/09/2016   Bilateral carotid artery stenosis 04/02/2015   Vertigo, peripheral 10/17/2014   Benign prostatic hyperplasia with urinary frequency 01/31/2014   Obesity 04/03/2013   Other malaise and fatigue 09/21/2012   Hyperlipidemia    Essential hypertension    3-vessel coronary artery disease    S/P CABG x  5     ONSET DATE: 07/03/23  REFERRING DIAG: M70.101 (ICD-10-CM) - Weakness of both lower extremities   THERAPY DIAG:  Abnormality of gait and mobility  Difficulty in walking, not elsewhere classified  Other abnormalities of gait and mobility  Muscle weakness (generalized)  Unsteadiness on feet  Rationale for Evaluation and Treatment: Rehabilitation  SUBJECTIVE:                                                                                                                                                                                             SUBJECTIVE STATEMENT:  Pt reports doing well today. Pt denies any recent falls/stumbles since prior  session. Pt denies any updates to medications or medical appointment since prior session.  Pt legs are sore from yesterday.    Pt accompanied by: significant other  PERTINENT HISTORY:  From prior PT evaluation:  PD, history of COVID and significant weakness present following this diagnosis and the use of paxlovid ( August 5 dx date, thinks Paxlovid may have exacerbated PD symptoms)    Pt reports having COVID and having significant weakness.  Patient reports he also has back pain that is not present for a long time but was exacerbated more recently.  Patient previously did physical therapy for his back and experienced some relief but has not been consistent with the exercises.  Patient also has history of going to Parkinson's rock steady classes multiple times per week prior to onset of his COVID but he has not been back since. Patient previously ambulated without an assistive device but is now ambulating with a straight point cane.  Patient reports increased foot and ankle weakness in comparison with his hips and knees.  Patient also reports low back pain that is exacerbated with prolonged standing.  PAIN:  Are you having pain? No  PRECAUTIONS: Fall  RED FLAGS: None   WEIGHT BEARING RESTRICTIONS: No  FALLS: Has patient fallen in last 6 months? Yes. Number of falls 6  LIVING ENVIRONMENT: Lives with: lives with their spouse Lives in: House/apartment Stairs: Yes: Internal: 15 steps; on left going up and External: 1 steps; none Has following equipment at home: Quad cane large base and Walker - 2 wheeled  PLOF: Independent  PATIENT GOALS: to improve overall strength, get back to walking more regularly  OBJECTIVE:  Note: Objective measures were completed at Evaluation unless otherwise noted.  DIAGNOSTIC FINDINGS:   EXAM: CT HEAD WITHOUT CONTRAST  IMPRESSION: 1. No acute intracranial process. 2. Mild chronic small vessel ischemic changes. 3. Acute right frontal and maxillary  sinusitis.  COGNITION: Overall cognitive status: Within functional limits for  tasks assessed   SENSATION: WFL  COORDINATION: WFL    LOWER EXTREMITY ROM:     Active  Right Eval Left Eval  Hip flexion    Hip extension    Hip abduction    Hip adduction    Hip internal rotation    Hip external rotation    Knee flexion    Knee extension    Ankle dorsiflexion    Ankle plantarflexion    Ankle inversion    Ankle eversion     (Blank rows = not tested)  LOWER EXTREMITY MMT:    MMT Right Eval Left Eval  Hip flexion 4 4  Hip abduction 4 4  Hip adduction 4 4  Knee flexion 3+ 3+  Knee extension 4- 4-  Ankle dorsiflexion 4+ 4  (Blank rows = not tested)  BED MOBILITY:  No limitations according to the pt.  TRANSFERS: Assistive device utilized: None  Sit to stand: Complete Independence Stand to sit: Complete Independence Chair to chair: Complete Independence Floor: Not tested  FUNCTIONAL TESTS:  5 times sit to stand: 15.03 sec Timed up and go (TUG): 13.38 sec 6 minute walk test: TBD 10 meter walk test: 12.15 sec; 0.82 m/s Dynamic Gait Index: TBD  PATIENT SURVEYS:  ABC scale 70.6%                                                                                                                              TREATMENT DATE: 02/18/24  TA   To wellzone for practice with using machines in gym.  Level 5 octane UE and LE reciprocal movement training x 6 min  Leg HS curl level 4 3 x 10  Leg extension machine 3 x 10 with level 3 plate  Row machine 3 x 10 level 3 plate   Physical Performance Test or Measurement: a  physical performance test(s) or measurement (eg,  musculoskeletal, functional capacity), with written report,  each 15 mins   Pt scores 25 / 28 on mini BEST balance test. Scores < 16 indicate increased risk for falls [place], [place], & [place]) and MCID is 4 (Godi,et al, 2013)    [facility] PT Assessment - 02/18/24 0001       Mini-BESTest   Sit To Stand  Normal: Comes to stand without use of hands and stabilizes independently.    Rise to Toes Normal: Stable for 3 s with maximum height.    Stand on one leg (left) Normal: 20 s.    Stand on one leg (right) Normal: 20 s.    Stand on one leg - lowest score 2    Compensatory Stepping Correction - Forward Normal: Recovers independently with a single, large step (second realignement is allowed).    Compensatory Stepping Correction - Backward Moderate: More than one step is required to recover equilibrium    Compensatory Stepping Correction - Left Lateral Normal: Recovers independently with 1 step (crossover or lateral OK)    Compensatory  Stepping Correction - Right Lateral Normal: Recovers independently with 1 step (crossover or lateral OK)    Stepping Corredtion Lateral - lowest score 2    Stance - Feet together, eyes open, firm surface  Normal: 30s    Stance - Feet together, eyes closed, foam surface  Normal: 30s    Incline - Eyes Closed Normal: Stands independently 30s and aligns with gravity    Change in Gait Speed Normal: Significantly changes walkling speed without imbalance    Walk with head turns - Horizontal Normal: performs head turns with no change in gait speed and good balance    Walk with pivot turns Normal: Turns with feet close FAST (< 3 steps) with good balance.    Step over obstacles Moderate: Steps over box but touches box OR displays cautious behavior by slowing gait.    Timed UP & GO with Dual Task Moderate: Dual Task affects either counting OR walking (>10%) when compared to the TUG without Dual Task.    Mini-BEST total score 25           PATIENT EDUCATION: Education details: Pt educated throughout session about proper posture and technique with exercises. Improved exercise technique, movement at target joints, use of target muscles after min to mod verbal, visual, tactile cues  Person educated: Patient and Spouse Education method: Explanation, Demonstration, and Verbal  cues Education comprehension: verbalized understanding  HOME EXERCISE PROGRAM: Seated PWR! Moves x 10 ea, handout provided   GOALS: Goals reviewed with patient? Yes  SHORT TERM GOALS: Target date: 09/14/2023  Pt will be independent with HEP in order to demonstrate increased ability to perform tasks related to occupation/hobbies. Baseline:  Pt to be given HEP at next visit. 7/31: has PWR! Activities, has not completed since fall and shoulder injury   Goal status: ONGOING  LONG TERM GOALS: Target date: 03/03/2024    1.  Patient (> 46 years old) will complete five times sit to stand test in < 15 seconds indicating an increased LE strength and improved balance. Baseline: 15.03 sec 4/14: 11.79 sec  5/29: 11.06 sec Goal status: MET  2.  Patient will increase their ABC scale score to be 80.6% (an increase of 10 points), demonstrating improved balance confidence and reduced fall risk, as measured through weekly assessments. Baseline: 70.6% 4/14:72 % 5/29: 74% 01/07/24: 90% Goal status: MET   3.  Patient will increase Berg Balance score by > 6 points to demonstrate decreased fall risk during functional activities. Baseline: 43 on 3/13 4/14: 49 5/29: 56 Goal status: MET   4.  Patient will reduce timed up and go to <11 seconds to reduce fall risk and demonstrate improved transfer/gait ability. Baseline: 13.38 sec 4/14: 11.4 sec no AD 5/29: 10.78 sec Goal status: MET  5.  Patient will increase 10 meter walk test to >1.68m/s as to improve gait speed for better community ambulation and to reduce fall risk. Baseline: 12.15 sec; 0.82 m/s 5/29: 1.06 m/s Goal status: MET  6.  Patient will increase six minute walk test distance to >1000 for progression to community ambulator and improve gait ability Baseline: 459 ft with RW stopping at 5:30 due to fatigue  09/21/23:1128 ft with 4WW  Goal status: MET  7.  Patient will increased Minibest by at least 3 points  to reduce fall risk and demonstrate  improved transfer/gait ability. Baseline: 11/09/23: 22   7/31: 25 Goal status: ONGOING   8.  Patient will be independent with gym-based exercise program that he  can complete 2-3 times weekly to build his lower extremity strength and postural strength Baseline: no gym program, wants to continue to progress leg strength  9/11: has been introduced to gym activities, still needs set up assistance  Goal status: ONGOING   ASSESSMENT:  CLINICAL IMPRESSION:  Patient presents for progress note this date.  Patient completes mini BEST test and shows great progress with this test.  Patient's balance is much improved with this task. Patient still working toward independent use of machines and well zone so we can complete this impunity based gym that he goes to for Parkinson's related symptoms. Patient's condition has the potential to improve in response to therapy. Maximum improvement is yet to be obtained. The anticipated improvement is attainable and reasonable in a generally predictable time.  Pt will continue to benefit from skilled physical therapy intervention to address impairments, improve QOL, and attain therapy goals.    OBJECTIVE IMPAIRMENTS: Abnormal gait, decreased activity tolerance, decreased balance, decreased endurance, decreased knowledge of use of DME, decreased mobility, difficulty walking, decreased ROM, decreased strength, and decreased safety awareness.   ACTIVITY LIMITATIONS: carrying, lifting, bending, standing, squatting, bathing, and locomotion level  PARTICIPATION LIMITATIONS: cleaning, laundry, driving, shopping, community activity, and yard work  PERSONAL FACTORS: Age, Education, Past/current experiences, Time since onset of injury/illness/exacerbation, and 3+ comorbidities: orthostatic HTN, syncope, falls, anemia, insomnia, dyspnea are also affecting patient's functional outcome.   REHAB POTENTIAL: Fair pt has been seen in the past.  CLINICAL DECISION MAKING:  Evolving/moderate complexity  EVALUATION COMPLEXITY: Moderate  PLAN:  PT FREQUENCY: 2x/week  PT DURATION: 8 weeks  PLANNED INTERVENTIONS: 97110-Therapeutic exercises, 97530- Therapeutic activity, 97112- Neuromuscular re-education, 97535- Self Care, [id number]- Manual therapy, [phone]- Gait training, Balance training, Stair training, and Vestibular training  PLAN FOR NEXT SESSION: Balance and strength interventions PD specific interventions as indicated  Progress with gym based program   Lonni KATHEE Gainer PT ,DPT Physical Therapist- [facility]  [facility]   11:29 AM 02/18/24

## 2024-02-22 ENCOUNTER — Encounter: Admitting: Occupational Therapy

## 2024-02-22 ENCOUNTER — Telehealth: Payer: Self-pay | Admitting: Internal Medicine

## 2024-02-22 ENCOUNTER — Ambulatory Visit: Admitting: Physical Therapy

## 2024-02-22 DIAGNOSIS — R2689 Other abnormalities of gait and mobility: Secondary | ICD-10-CM

## 2024-02-22 DIAGNOSIS — Z0279 Encounter for issue of other medical certificate: Secondary | ICD-10-CM

## 2024-02-22 DIAGNOSIS — R269 Unspecified abnormalities of gait and mobility: Secondary | ICD-10-CM

## 2024-02-22 DIAGNOSIS — M6281 Muscle weakness (generalized): Secondary | ICD-10-CM

## 2024-02-22 DIAGNOSIS — R262 Difficulty in walking, not elsewhere classified: Secondary | ICD-10-CM

## 2024-02-22 DIAGNOSIS — R2681 Unsteadiness on feet: Secondary | ICD-10-CM | POA: Diagnosis not present

## 2024-02-22 NOTE — Telephone Encounter (Signed)
 Patient dropped off a 60 day medical verification form. Form is up front in Dr Lula color folder. Please call when ready.

## 2024-02-22 NOTE — Telephone Encounter (Signed)
Placed in red folder for completion.  

## 2024-02-22 NOTE — Therapy (Signed)
 OUTPATIENT PHYSICAL THERAPY NEURO TREATMENT/Physical Therapy Progress Note  Dates of reporting period  01/07/24   to   02/18/24  Patient Name: Johnathan Arnold. MRN: [id number] DOB:1945/11/30, 78 y.o., male Today's Date: 02/22/2024  PCP: Marylynn Verneita CROME, MD  REFERRING PROVIDER: Marylynn Verneita CROME, MD   END OF SESSION:  PT End of Session - 02/22/24 1108     Visit Number 41    Number of Visits 46   corrected   Date for PT Re-Evaluation 03/03/24   corrected   Progress Note Due on Visit 50    PT Start Time 1100    PT Stop Time 1141    PT Time Calculation (min) 41 min    Equipment Utilized During Treatment Gait belt    Activity Tolerance Patient tolerated treatment well    Behavior During Therapy [facility] for tasks assessed/performed            Past Medical History:  Diagnosis Date   3-vessel coronary artery disease    s/p  5 vessel CABG   Allergy June 2024   Diabetes mellitus without complication (HCC)    History of cardiac catheterization 2011   [facility]   Hyperlipidemia    Hypertension    Hypertriglyceridemia    Neuromuscular disorder [facility]) October 2017   Diagonis Parkinson   Parkinson's disease [facility])    Pneumonia 12/28/2020   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 LIMA to LAD, SVG to D1, OM1 and RPDA   CORONARY ARTERY BYPASS GRAFT  03/1998   5 vessel, [facility]   RIGHT HEART CATH N/A 04/04/2019   Procedure: RIGHT HEART CATH;  Surgeon: Darron Deatrice LABOR, MD;  Location: [facility] INVASIVE CV LAB;  Service: Cardiovascular;  Laterality: N/A;   RIGHT/LEFT HEART CATH AND CORONARY ANGIOGRAPHY N/A 02/01/2018   Procedure: RIGHT/LEFT HEART CATH AND CORONARY ANGIOGRAPHY;  Surgeon: Darron Deatrice LABOR, MD;  Location: [facility] INVASIVE CV LAB;  Service: Cardiovascular;  Laterality: N/A;   Patient Active Problem List   Diagnosis Date Noted   Diverticulitis 07/26/2023   RSV  (respiratory syncytial virus pneumonia) 07/26/2023   Impaired ambulation 07/02/2023   Orthostatic hypotension 05/22/2023   Generalized weakness 05/07/2021   Bradycardia    Anemia, unspecified 02/07/2021   Neutropenia (HCC) 01/29/2021   Thrombocytopenia (HCC) 01/29/2021   Right lower lobe pneumonia 12/28/2020   Hyponatremia 12/22/2020   Bilateral leg weakness 12/20/2020   Prostate cancer screening 09/08/2020   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) 09/06/2020   Low back pain 08/19/2019   Pulmonary hypertension (HCC)    Insomnia 12/21/2018   Sleep apnea in adult 12/09/2018   Periodic limb movement disorder 12/09/2018   Pulmonary nodules 05/18/2018   Wears hearing aid in both ears 05/17/2018   Leg pain, bilateral 02/20/2018   Dyspnea    CKD stage 3a, GFR 45-59 ml/min (HCC) 09/21/2017   History of skin cancer in adulthood 08/14/2016   Parkinson's disease (HCC) 08/09/2016   Bilateral carotid artery stenosis 04/02/2015   Vertigo, peripheral 10/17/2014   Benign prostatic hyperplasia with urinary frequency 01/31/2014   Obesity 04/03/2013   Other malaise and fatigue 09/21/2012   Hyperlipidemia    Essential hypertension    3-vessel coronary artery disease    S/P CABG x 5     ONSET DATE: 07/03/23  REFERRING DIAG: M70.101 (ICD-10-CM) - Weakness of both lower  extremities   THERAPY DIAG:  No diagnosis found.  Rationale for Evaluation and Treatment: Rehabilitation  SUBJECTIVE:                                                                                                                                                                                             SUBJECTIVE STATEMENT:  Pt reports doing well today. Pt denies any recent falls/stumbles since prior session. Pt denies any updates to medications or medical appointment since prior session.    Pt accompanied by: significant other  PERTINENT HISTORY:  From prior PT evaluation:  PD, history of  COVID and significant weakness present following this diagnosis and the use of paxlovid ( August 5 dx date, thinks Paxlovid may have exacerbated PD symptoms)    Pt reports having COVID and having significant weakness.  Patient reports he also has back pain that is not present for a long time but was exacerbated more recently.  Patient previously did physical therapy for his back and experienced some relief but has not been consistent with the exercises.  Patient also has history of going to Parkinson's rock steady classes multiple times per week prior to onset of his COVID but he has not been back since. Patient previously ambulated without an assistive device but is now ambulating with a straight point cane.  Patient reports increased foot and ankle weakness in comparison with his hips and knees.  Patient also reports low back pain that is exacerbated with prolonged standing.  PAIN:  Are you having pain? No  PRECAUTIONS: Fall  RED FLAGS: None   WEIGHT BEARING RESTRICTIONS: No  FALLS: Has patient fallen in last 6 months? Yes. Number of falls 6  LIVING ENVIRONMENT: Lives with: lives with their spouse Lives in: House/apartment Stairs: Yes: Internal: 15 steps; on left going up and External: 1 steps; none Has following equipment at home: Quad cane large base and Walker - 2 wheeled  PLOF: Independent  PATIENT GOALS: to improve overall strength, get back to walking more regularly  OBJECTIVE:  Note: Objective measures were completed at Evaluation unless otherwise noted.  DIAGNOSTIC FINDINGS:   EXAM: CT HEAD WITHOUT CONTRAST  IMPRESSION: 1. No acute intracranial process. 2. Mild chronic small vessel ischemic changes. 3. Acute right frontal and maxillary sinusitis.  COGNITION: Overall cognitive status: Within functional limits for tasks assessed   SENSATION: WFL  COORDINATION: WFL    LOWER EXTREMITY ROM:     Active  Right Eval Left Eval  Hip flexion    Hip extension     Hip abduction    Hip adduction    Hip internal rotation  Hip external rotation    Knee flexion    Knee extension    Ankle dorsiflexion    Ankle plantarflexion    Ankle inversion    Ankle eversion     (Blank rows = not tested)  LOWER EXTREMITY MMT:    MMT Right Eval Left Eval  Hip flexion 4 4  Hip abduction 4 4  Hip adduction 4 4  Knee flexion 3+ 3+  Knee extension 4- 4-  Ankle dorsiflexion 4+ 4  (Blank rows = not tested)  BED MOBILITY:  No limitations according to the pt.  TRANSFERS: Assistive device utilized: None  Sit to stand: Complete Independence Stand to sit: Complete Independence Chair to chair: Complete Independence Floor: Not tested  FUNCTIONAL TESTS:  5 times sit to stand: 15.03 sec Timed up and go (TUG): 13.38 sec 6 minute walk test: TBD 10 meter walk test: 12.15 sec; 0.82 m/s Dynamic Gait Index: TBD  PATIENT SURVEYS:  ABC scale 70.6%                                                                                                                              TREATMENT DATE: 02/22/24  TA- To improve functional movements patterns for everyday tasks  Level 5 octane UE and LE reciprocal movement training x 6 min   TE- To improve strength, endurance, mobility, and function of specific targeted muscle groups or improve joint range of motion or improve muscle flexibility  To wellzone for practice with using machines in gym.   Leg HS curl level 4 3 x 10  Leg extension machine 3 x 10 with level 3 plate  Row machine 3 x 10 level 3 plate  Leg press in PT gym 3 x 10 @ 55# - cues for proper foot width and knee alignment for decreased injury risk and improved power production  -All above rated as challenging.   GAIT TRAINING Using speed ladder with 2.5 pound ankle weights donned to work on coordination and step length and picking up feet.  X 3 times through of sidestepping and forward gait with 1 foot in each ladder rung at a time. Seated rest X 2 laps  through of each of the following forward step and retrostep out and then forward step and to next trunk facing perpendicular to long axis of ladder  - icky shuffle side stepping into and out of 1 ladder wrong and then sidestepping into and out of the next ladder rung     PATIENT EDUCATION: Education details: Pt educated throughout session about proper posture and technique with exercises. Improved exercise technique, movement at target joints, use of target muscles after min to mod verbal, visual, tactile cues  Person educated: Patient and Spouse Education method: Explanation, Demonstration, and Verbal cues Education comprehension: verbalized understanding  HOME EXERCISE PROGRAM: Seated PWR! Moves x 10 ea, handout provided   GOALS: Goals reviewed with patient? Yes  SHORT TERM GOALS: Target date: 09/14/2023  Pt will be independent with HEP in order to demonstrate increased ability to perform tasks related to occupation/hobbies. Baseline:  Pt to be given HEP at next visit. 7/31: has PWR! Activities, has not completed since fall and shoulder injury   Goal status: ONGOING  LONG TERM GOALS: Target date: 03/03/2024    1.  Patient (> 66 years old) will complete five times sit to stand test in < 15 seconds indicating an increased LE strength and improved balance. Baseline: 15.03 sec 4/14: 11.79 sec  5/29: 11.06 sec Goal status: MET  2.  Patient will increase their ABC scale score to be 80.6% (an increase of 10 points), demonstrating improved balance confidence and reduced fall risk, as measured through weekly assessments. Baseline: 70.6% 4/14:72 % 5/29: 74% 01/07/24: 90% Goal status: MET   3.  Patient will increase Berg Balance score by > 6 points to demonstrate decreased fall risk during functional activities. Baseline: 43 on 3/13 4/14: 49 5/29: 56 Goal status: MET   4.  Patient will reduce timed up and go to <11 seconds to reduce fall risk and demonstrate improved transfer/gait  ability. Baseline: 13.38 sec 4/14: 11.4 sec no AD 5/29: 10.78 sec Goal status: MET  5.  Patient will increase 10 meter walk test to >1.31m/s as to improve gait speed for better community ambulation and to reduce fall risk. Baseline: 12.15 sec; 0.82 m/s 5/29: 1.06 m/s Goal status: MET  6.  Patient will increase six minute walk test distance to >1000 for progression to community ambulator and improve gait ability Baseline: 459 ft with RW stopping at 5:30 due to fatigue  09/21/23:1128 ft with 4WW  Goal status: MET  7.  Patient will increased Minibest by at least 3 points  to reduce fall risk and demonstrate improved transfer/gait ability. Baseline: 11/09/23: 22   7/31: 25 Goal status: ONGOING   8.  Patient will be independent with gym-based exercise program that he can complete 2-3 times weekly to build his lower extremity strength and postural strength Baseline: no gym program, wants to continue to progress leg strength  9/11: has been introduced to gym activities, still needs set up assistance  Goal status: ONGOING   ASSESSMENT:  CLINICAL IMPRESSION:  Patient presents with good motivation for completion of physical therapy activities.  Patient continued with practicing gym related activities he can complete before and/or after his Parkinson's specific boxing classes during the week.  Patient continues to require some cues for set up but overall is adapting well and not experiencing any adverse reactions to the activities.  Patient will continue to benefit from further instruction as well as gait and Parkinson specific training as indicated.  OBJECTIVE IMPAIRMENTS: Abnormal gait, decreased activity tolerance, decreased balance, decreased endurance, decreased knowledge of use of DME, decreased mobility, difficulty walking, decreased ROM, decreased strength, and decreased safety awareness.   ACTIVITY LIMITATIONS: carrying, lifting, bending, standing, squatting, bathing, and locomotion  level  PARTICIPATION LIMITATIONS: cleaning, laundry, driving, shopping, community activity, and yard work  PERSONAL FACTORS: Age, Education, Past/current experiences, Time since onset of injury/illness/exacerbation, and 3+ comorbidities: orthostatic HTN, syncope, falls, anemia, insomnia, dyspnea are also affecting patient's functional outcome.   REHAB POTENTIAL: Fair pt has been seen in the past.  CLINICAL DECISION MAKING: Evolving/moderate complexity  EVALUATION COMPLEXITY: Moderate  PLAN:  PT FREQUENCY: 2x/week  PT DURATION: 8 weeks  PLANNED INTERVENTIONS: 97110-Therapeutic exercises, 97530- Therapeutic activity, [id number]- Neuromuscular re-education, 97535- Self Care, [id number]- Manual therapy, [phone]- Gait training, Balance training, Stair training, and  Vestibular training  PLAN FOR NEXT SESSION: Balance and strength interventions PD specific interventions as indicated  Progress with gym based program, work toward independence with good form    Lonni KATHEE Gainer PT ,DPT Physical Therapist- [facility]  [facility]   11:27 AM 02/22/24

## 2024-02-24 ENCOUNTER — Encounter: Payer: Self-pay | Admitting: Neurology

## 2024-02-24 NOTE — Telephone Encounter (Signed)
 Pt is aware that paperwork is complete and ready for pick up. Paperwork has been placed up front in accordion folder.

## 2024-02-25 ENCOUNTER — Ambulatory Visit: Admitting: Physical Therapy

## 2024-02-25 ENCOUNTER — Encounter: Admitting: Occupational Therapy

## 2024-02-26 NOTE — Telephone Encounter (Signed)
 Pt has picked paperwork up

## 2024-02-29 ENCOUNTER — Ambulatory Visit: Admitting: Physical Therapy

## 2024-02-29 ENCOUNTER — Encounter: Payer: Self-pay | Admitting: Physical Therapy

## 2024-02-29 ENCOUNTER — Encounter: Admitting: Occupational Therapy

## 2024-02-29 DIAGNOSIS — R2689 Other abnormalities of gait and mobility: Secondary | ICD-10-CM | POA: Diagnosis not present

## 2024-02-29 DIAGNOSIS — R269 Unspecified abnormalities of gait and mobility: Secondary | ICD-10-CM

## 2024-02-29 DIAGNOSIS — M6281 Muscle weakness (generalized): Secondary | ICD-10-CM

## 2024-02-29 DIAGNOSIS — R262 Difficulty in walking, not elsewhere classified: Secondary | ICD-10-CM | POA: Diagnosis not present

## 2024-02-29 DIAGNOSIS — R2681 Unsteadiness on feet: Secondary | ICD-10-CM

## 2024-02-29 NOTE — Therapy (Signed)
 OUTPATIENT PHYSICAL THERAPY NEURO TREATMENT  Patient Name: Johnathan Arnold. MRN: [id number] DOB:[birth year], 78 y.o., male 7 Date: 02/29/2024  PCP: Marylynn Verneita CROME, MD  REFERRING PROVIDER: Marylynn Verneita CROME, MD   END OF SESSION:  PT End of Session - 02/29/24 1300     Visit Number 42    Number of Visits 46   corrected   Date for Recertification  03/03/24   corrected   Progress Note Due on Visit 50    PT Start Time 1103    PT Stop Time 1142    PT Time Calculation (min) 39 min    Equipment Utilized During Treatment Gait belt    Activity Tolerance Patient tolerated treatment well    Behavior During Therapy [facility] for tasks assessed/performed             Past Medical History:  Diagnosis Date   3-vessel coronary artery disease    s/p  5 vessel CABG   Allergy June 2024   Diabetes mellitus without complication (HCC)    History of cardiac catheterization 2011   [facility]   Hyperlipidemia    Hypertension    Hypertriglyceridemia    Neuromuscular disorder [facility]) October 2017   Diagonis Parkinson   Parkinson's disease [facility])    Pneumonia 12/28/2020   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 LIMA to LAD, SVG to D1, OM1 and RPDA   CORONARY ARTERY BYPASS GRAFT  03/1998   5 vessel, [facility]   RIGHT HEART CATH N/A 04/04/2019   Procedure: RIGHT HEART CATH;  Surgeon: Darron Deatrice LABOR, MD;  Location: [facility] INVASIVE CV LAB;  Service: Cardiovascular;  Laterality: N/A;   RIGHT/LEFT HEART CATH AND CORONARY ANGIOGRAPHY N/A 02/01/2018   Procedure: RIGHT/LEFT HEART CATH AND CORONARY ANGIOGRAPHY;  Surgeon: Darron Deatrice LABOR, MD;  Location: [facility] INVASIVE CV LAB;  Service: Cardiovascular;  Laterality: N/A;   Patient Active Problem List   Diagnosis Date Noted   Diverticulitis 07/26/2023   RSV (respiratory syncytial virus pneumonia) 07/26/2023   Impaired ambulation 07/02/2023    Orthostatic hypotension 05/22/2023   Generalized weakness 05/07/2021   Bradycardia    Anemia, unspecified 02/07/2021   Neutropenia (HCC) 01/29/2021   Thrombocytopenia (HCC) 01/29/2021   Right lower lobe pneumonia 12/28/2020   Hyponatremia 12/22/2020   Bilateral leg weakness 12/20/2020   Prostate cancer screening 09/08/2020   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) 09/06/2020   Low back pain 08/19/2019   Pulmonary hypertension (HCC)    Insomnia 12/21/2018   Sleep apnea in adult 12/09/2018   Periodic limb movement disorder 12/09/2018   Pulmonary nodules 05/18/2018   Wears hearing aid in both ears 05/17/2018   Leg pain, bilateral 02/20/2018   Dyspnea    CKD stage 3a, GFR 45-59 ml/min (HCC) 09/21/2017   History of skin cancer in adulthood 08/14/2016   Parkinson's disease (HCC) 08/09/2016   Bilateral carotid artery stenosis 04/02/2015   Vertigo, peripheral 10/17/2014   Benign prostatic hyperplasia with urinary frequency 01/31/2014   Obesity 04/03/2013   Other malaise and fatigue 09/21/2012   Hyperlipidemia    Essential hypertension    3-vessel coronary artery disease    S/P CABG x 5     ONSET DATE: 07/03/23  REFERRING DIAG: M70.101 (ICD-10-CM) - Weakness of both lower extremities   THERAPY DIAG:  Abnormality of gait and mobility  Difficulty in walking,  not elsewhere classified  Other abnormalities of gait and mobility  Muscle weakness (generalized)  Unsteadiness on feet  Rationale for Evaluation and Treatment: Rehabilitation  SUBJECTIVE:                                                                                                                                                                                             SUBJECTIVE STATEMENT:  Pt reports doing well today. Pt denies any recent falls/stumbles since prior session. Pt denies any updates to medications or medical appointment since prior session. Is more comfortable with gym  equipment. OK with d/c next visit.    Pt accompanied by: significant other  PERTINENT HISTORY:  From prior PT evaluation:  PD, history of COVID and significant weakness present following this diagnosis and the use of paxlovid ( August 5 dx date, thinks Paxlovid may have exacerbated PD symptoms)    Pt reports having COVID and having significant weakness.  Patient reports he also has back pain that is not present for a long time but was exacerbated more recently.  Patient previously did physical therapy for his back and experienced some relief but has not been consistent with the exercises.  Patient also has history of going to Parkinson's rock steady classes multiple times per week prior to onset of his COVID but he has not been back since. Patient previously ambulated without an assistive device but is now ambulating with a straight point cane.  Patient reports increased foot and ankle weakness in comparison with his hips and knees.  Patient also reports low back pain that is exacerbated with prolonged standing.  PAIN:  Are you having pain? No  PRECAUTIONS: Fall  RED FLAGS: None   WEIGHT BEARING RESTRICTIONS: No  FALLS: Has patient fallen in last 6 months? Yes. Number of falls 6  LIVING ENVIRONMENT: Lives with: lives with their spouse Lives in: House/apartment Stairs: Yes: Internal: 15 steps; on left going up and External: 1 steps; none Has following equipment at home: Quad cane large base and Walker - 2 wheeled  PLOF: Independent  PATIENT GOALS: to improve overall strength, get back to walking more regularly  OBJECTIVE:  Note: Objective measures were completed at Evaluation unless otherwise noted.  DIAGNOSTIC FINDINGS:   EXAM: CT HEAD WITHOUT CONTRAST  IMPRESSION: 1. No acute intracranial process. 2. Mild chronic small vessel ischemic changes. 3. Acute right frontal and maxillary sinusitis.  COGNITION: Overall cognitive status: Within functional limits for tasks  assessed   SENSATION: WFL  COORDINATION: WFL    LOWER EXTREMITY ROM:     Active  Right Eval Left Eval  Hip flexion  Hip extension    Hip abduction    Hip adduction    Hip internal rotation    Hip external rotation    Knee flexion    Knee extension    Ankle dorsiflexion    Ankle plantarflexion    Ankle inversion    Ankle eversion     (Blank rows = not tested)  LOWER EXTREMITY MMT:    MMT Right Eval Left Eval  Hip flexion 4 4  Hip abduction 4 4  Hip adduction 4 4  Knee flexion 3+ 3+  Knee extension 4- 4-  Ankle dorsiflexion 4+ 4  (Blank rows = not tested)  BED MOBILITY:  No limitations according to the pt.  TRANSFERS: Assistive device utilized: None  Sit to stand: Complete Independence Stand to sit: Complete Independence Chair to chair: Complete Independence Floor: Not tested  FUNCTIONAL TESTS:  5 times sit to stand: 15.03 sec Timed up and go (TUG): 13.38 sec 6 minute walk test: TBD 10 meter walk test: 12.15 sec; 0.82 m/s Dynamic Gait Index: TBD  PATIENT SURVEYS:  ABC scale 70.6%                                                                                                                              TREATMENT DATE: 02/29/24  TA- To improve functional movements patterns for everyday tasks  Level 5 octane UE and LE reciprocal movement training x 6 min   TE- To improve strength, endurance, mobility, and function of specific targeted muscle groups or improve joint range of motion or improve muscle flexibility  To wellzone for practice with using machines in gym.   Leg HS curl level 4; 3 x 10  Leg extension machine 3 x 10 with level 3 plate  Row machine 3 x 10 level 3 plate  Leg press  3 x 10 @ Lev 5 - cues for proper foot width and knee alignment for decreased injury risk and improved power production  -All above rated as challenging.   NMR: To facilitate reeducation of movement, balance, posture, coordination, and/or  proprioception/kinesthetic sense.  BOSU squat x 20 hard side BOSU PWR rock x 10 ea hard side Bosu stance soft side Airex beam lat walking x 5 laps  Unless otherwise stated, CGA was provided and gait belt donned in order to ensure pt safety with NMR activities     PATIENT EDUCATION: Education details: Pt educated throughout session about proper posture and technique with exercises. Improved exercise technique, movement at target joints, use of target muscles after min to mod verbal, visual, tactile cues  Person educated: Patient and Spouse Education method: Explanation, Demonstration, and Verbal cues Education comprehension: verbalized understanding  HOME EXERCISE PROGRAM: Seated PWR! Moves x 10 ea, handout provided   GOALS: Goals reviewed with patient? Yes  SHORT TERM GOALS: Target date: 09/14/2023  Pt will be independent with HEP in order to demonstrate increased ability to perform tasks related to occupation/hobbies. Baseline:  Pt to be given HEP at next visit. 7/31: has PWR! Activities, has not completed since fall and shoulder injury   Goal status: ONGOING  LONG TERM GOALS: Target date: 03/03/2024    1.  Patient (> 90 years old) will complete five times sit to stand test in < 15 seconds indicating an increased LE strength and improved balance. Baseline: 15.03 sec 4/14: 11.79 sec  5/29: 11.06 sec Goal status: MET  2.  Patient will increase their ABC scale score to be 80.6% (an increase of 10 points), demonstrating improved balance confidence and reduced fall risk, as measured through weekly assessments. Baseline: 70.6% 4/14:72 % 5/29: 74% 01/07/24: 90% Goal status: MET   3.  Patient will increase Berg Balance score by > 6 points to demonstrate decreased fall risk during functional activities. Baseline: 43 on 3/13 4/14: 49 5/29: 56 Goal status: MET   4.  Patient will reduce timed up and go to <11 seconds to reduce fall risk and demonstrate improved transfer/gait  ability. Baseline: 13.38 sec 4/14: 11.4 sec no AD 5/29: 10.78 sec Goal status: MET  5.  Patient will increase 10 meter walk test to >1.71m/s as to improve gait speed for better community ambulation and to reduce fall risk. Baseline: 12.15 sec; 0.82 m/s 5/29: 1.06 m/s Goal status: MET  6.  Patient will increase six minute walk test distance to >1000 for progression to community ambulator and improve gait ability Baseline: 459 ft with RW stopping at 5:30 due to fatigue  09/21/23:1128 ft with 4WW  Goal status: MET  7.  Patient will increased Minibest by at least 3 points  to reduce fall risk and demonstrate improved transfer/gait ability. Baseline: 11/09/23: 22   7/31: 25 Goal status: ONGOING   8.  Patient will be independent with gym-based exercise program that he can complete 2-3 times weekly to build his lower extremity strength and postural strength Baseline: no gym program, wants to continue to progress leg strength  9/11: has been introduced to gym activities, still needs set up assistance  Goal status: ONGOING   ASSESSMENT:  CLINICAL IMPRESSION:  Patient presents with good motivation for completion of physical therapy activities.  Patient continued with practicing gym related activities he can complete before and/or after his Parkinson's specific boxing classes during the week.  Patient continues to require some cues for set up but overall is adapting well and not experiencing any adverse reactions to the activities. Felt sore after las session but recovered in a few days.   Patient will continue to benefit from further instruction as well as gait and Parkinson specific training as indicated.  OBJECTIVE IMPAIRMENTS: Abnormal gait, decreased activity tolerance, decreased balance, decreased endurance, decreased knowledge of use of DME, decreased mobility, difficulty walking, decreased ROM, decreased strength, and decreased safety awareness.   ACTIVITY LIMITATIONS: carrying, lifting,  bending, standing, squatting, bathing, and locomotion level  PARTICIPATION LIMITATIONS: cleaning, laundry, driving, shopping, community activity, and yard work  PERSONAL FACTORS: Age, Education, Past/current experiences, Time since onset of injury/illness/exacerbation, and 3+ comorbidities: orthostatic HTN, syncope, falls, anemia, insomnia, dyspnea are also affecting patient's functional outcome.   REHAB POTENTIAL: Fair pt has been seen in the past.  CLINICAL DECISION MAKING: Evolving/moderate complexity  EVALUATION COMPLEXITY: Moderate  PLAN:  PT FREQUENCY: 2x/week  PT DURATION: 8 weeks  PLANNED INTERVENTIONS: 97110-Therapeutic exercises, 97530- Therapeutic activity, [id number]- Neuromuscular re-education, 97535- Self Care, [id number]- Manual therapy, [phone]- Gait training, Balance training, Stair training, and Vestibular training  PLAN FOR NEXT SESSION: Balance  and strength interventions PD specific interventions as indicated  Progress with gym based program, work toward independence with good form    Lonni KATHEE Gainer PT ,DPT Physical Therapist- [place]  [facility]   1:00 PM 02/29/24

## 2024-03-03 ENCOUNTER — Other Ambulatory Visit: Payer: Self-pay | Admitting: Internal Medicine

## 2024-03-03 ENCOUNTER — Encounter: Admitting: Occupational Therapy

## 2024-03-03 ENCOUNTER — Ambulatory Visit: Admitting: Physical Therapy

## 2024-03-03 DIAGNOSIS — M6281 Muscle weakness (generalized): Secondary | ICD-10-CM

## 2024-03-03 DIAGNOSIS — R2689 Other abnormalities of gait and mobility: Secondary | ICD-10-CM | POA: Diagnosis not present

## 2024-03-03 DIAGNOSIS — R262 Difficulty in walking, not elsewhere classified: Secondary | ICD-10-CM

## 2024-03-03 DIAGNOSIS — R2681 Unsteadiness on feet: Secondary | ICD-10-CM | POA: Diagnosis not present

## 2024-03-03 DIAGNOSIS — R269 Unspecified abnormalities of gait and mobility: Secondary | ICD-10-CM

## 2024-03-03 NOTE — Therapy (Signed)
 OUTPATIENT PHYSICAL THERAPY NEURO TREATMENT/ DISCHARGE PT   Patient Name: Johnathan Arnold. MRN: [id number] DOB:05-17-1946, 78 y.o., male Today's Date: 03/03/2024  PCP: Marylynn Verneita CROME, MD  REFERRING PROVIDER: Marylynn Verneita CROME, MD   END OF SESSION:  PT End of Session - 03/03/24 1202     Visit Number 43    Number of Visits 46   corrected   Date for Recertification  03/03/24   corrected   PT Start Time 1102    PT Stop Time 1144    PT Time Calculation (min) 42 min    Equipment Utilized During Treatment Gait belt    Activity Tolerance Patient tolerated treatment well    Behavior During Therapy [facility] for tasks assessed/performed              Past Medical History:  Diagnosis Date   3-vessel coronary artery disease    s/p  5 vessel CABG   Allergy June 2024   Diabetes mellitus without complication (HCC)    History of cardiac catheterization 2011   [facility]   Hyperlipidemia    Hypertension    Hypertriglyceridemia    Neuromuscular disorder [facility]) October 2017   Diagonis Parkinson   Parkinson's disease [facility])    Pneumonia 12/28/2020   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 LIMA to LAD, SVG to D1, OM1 and RPDA   CORONARY ARTERY BYPASS GRAFT  03/1998   5 vessel, [facility]   RIGHT HEART CATH N/A 04/04/2019   Procedure: RIGHT HEART CATH;  Surgeon: Darron Deatrice LABOR, MD;  Location: [facility] INVASIVE CV LAB;  Service: Cardiovascular;  Laterality: N/A;   RIGHT/LEFT HEART CATH AND CORONARY ANGIOGRAPHY N/A 02/01/2018   Procedure: RIGHT/LEFT HEART CATH AND CORONARY ANGIOGRAPHY;  Surgeon: Darron Deatrice LABOR, MD;  Location: [facility] INVASIVE CV LAB;  Service: Cardiovascular;  Laterality: N/A;   Patient Active Problem List   Diagnosis Date Noted   Diverticulitis 07/26/2023   RSV (respiratory syncytial virus pneumonia) 07/26/2023   Impaired ambulation 07/02/2023   Orthostatic  hypotension 05/22/2023   Generalized weakness 05/07/2021   Bradycardia    Anemia, unspecified 02/07/2021   Neutropenia 01/29/2021   Thrombocytopenia 01/29/2021   Right lower lobe pneumonia 12/28/2020   Hyponatremia 12/22/2020   Bilateral leg weakness 12/20/2020   Prostate cancer screening 09/08/2020   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) 09/06/2020   Low back pain 08/19/2019   Pulmonary hypertension (HCC)    Insomnia 12/21/2018   Sleep apnea in adult 12/09/2018   Periodic limb movement disorder 12/09/2018   Pulmonary nodules 05/18/2018   Wears hearing aid in both ears 05/17/2018   Leg pain, bilateral 02/20/2018   Dyspnea    CKD stage 3a, GFR 45-59 ml/min (HCC) 09/21/2017   History of skin cancer in adulthood 08/14/2016   Parkinson's disease (HCC) 08/09/2016   Bilateral carotid artery stenosis 04/02/2015   Vertigo, peripheral 10/17/2014   Benign prostatic hyperplasia with urinary frequency 01/31/2014   Obesity 04/03/2013   Other malaise and fatigue 09/21/2012   Hyperlipidemia    Essential hypertension    3-vessel coronary artery disease    S/P CABG x 5     ONSET DATE: 07/03/23  REFERRING DIAG: M70.101 (ICD-10-CM) - Weakness of both lower extremities   THERAPY DIAG:  Abnormality of gait and mobility  Difficulty in walking, not elsewhere classified  Other abnormalities of  gait and mobility  Muscle weakness (generalized)  Unsteadiness on feet  Rationale for Evaluation and Treatment: Rehabilitation  SUBJECTIVE:                                                                                                                                                                                             SUBJECTIVE STATEMENT:  Pt reports doing well today. Pt denies any recent falls/stumbles since prior session. Pt denies any updates to medications or medical appointment since prior session. Is more comfortable with gym equipment. OK with d/c next  visit.    Pt accompanied by: significant other  PERTINENT HISTORY:  From prior PT evaluation:  PD, history of COVID and significant weakness present following this diagnosis and the use of paxlovid ( August 5 dx date, thinks Paxlovid may have exacerbated PD symptoms)    Pt reports having COVID and having significant weakness.  Patient reports he also has back pain that is not present for a long time but was exacerbated more recently.  Patient previously did physical therapy for his back and experienced some relief but has not been consistent with the exercises.  Patient also has history of going to Parkinson's rock steady classes multiple times per week prior to onset of his COVID but he has not been back since. Patient previously ambulated without an assistive device but is now ambulating with a straight point cane.  Patient reports increased foot and ankle weakness in comparison with his hips and knees.  Patient also reports low back pain that is exacerbated with prolonged standing.  PAIN:  Are you having pain? No  PRECAUTIONS: Fall  RED FLAGS: None   WEIGHT BEARING RESTRICTIONS: No  FALLS: Has patient fallen in last 6 months? Yes. Number of falls 6  LIVING ENVIRONMENT: Lives with: lives with their spouse Lives in: House/apartment Stairs: Yes: Internal: 15 steps; on left going up and External: 1 steps; none Has following equipment at home: Quad cane large base and Walker - 2 wheeled  PLOF: Independent  PATIENT GOALS: to improve overall strength, get back to walking more regularly  OBJECTIVE:  Note: Objective measures were completed at Evaluation unless otherwise noted.  DIAGNOSTIC FINDINGS:   EXAM: CT HEAD WITHOUT CONTRAST  IMPRESSION: 1. No acute intracranial process. 2. Mild chronic small vessel ischemic changes. 3. Acute right frontal and maxillary sinusitis.  COGNITION: Overall cognitive status: Within functional limits for tasks  assessed   SENSATION: WFL  COORDINATION: WFL    LOWER EXTREMITY ROM:     Active  Right Eval Left Eval  Hip flexion    Hip extension  Hip abduction    Hip adduction    Hip internal rotation    Hip external rotation    Knee flexion    Knee extension    Ankle dorsiflexion    Ankle plantarflexion    Ankle inversion    Ankle eversion     (Blank rows = not tested)  LOWER EXTREMITY MMT:    MMT Right Eval Left Eval  Hip flexion 4 4  Hip abduction 4 4  Hip adduction 4 4  Knee flexion 3+ 3+  Knee extension 4- 4-  Ankle dorsiflexion 4+ 4  (Blank rows = not tested)  BED MOBILITY:  No limitations according to the pt.  TRANSFERS: Assistive device utilized: None  Sit to stand: Complete Independence Stand to sit: Complete Independence Chair to chair: Complete Independence Floor: Not tested  FUNCTIONAL TESTS:  5 times sit to stand: 15.03 sec Timed up and go (TUG): 13.38 sec 6 minute walk test: TBD 10 meter walk test: 12.15 sec; 0.82 m/s Dynamic Gait Index: TBD  PATIENT SURVEYS:  ABC scale 70.6%                                                                                                                              TREATMENT DATE: 03/03/24  TA- To improve functional movements patterns for everyday tasks  Level 5 octane UE and LE reciprocal movement training x 6 min   TE- To improve strength, endurance, mobility, and function of specific targeted muscle groups or improve joint range of motion or improve muscle flexibility  To [facility] for practice with using machines in gym.   Leg HS curl level 4; 3 x 10  Leg extension machine 3 x 10 with level 3 plate  Row machine 3 x 10 level 3 plate  Leg press  3 x 10 @ Lev 5 -allowed pt to indepnendently set up and use all equipment. Min cues but pt comfortable with getting it figured   NMR: To facilitate reeducation of movement, balance, posture, coordination, and/or proprioception/kinesthetic sense. Seated PWR!  Moves for HEP review x 10 ea of up, rock, twist, step  Standing PRW! Moves x 10 ea of up, rock, twist, step   Forward and then retro stepping over obstacles x 4 laps  - lateral stepping over obstacles x 3 laps  PWR! Up targets postural strengthening and antigravity extension, PRW! Rock targets functional weight shifting, PRW! Twist targets trunk rotation and PRW! Step targets transition movements. PWR! Moves target bradykinesia, rigidity, and dyskinesia through targeted functional movements that address four core movement difficulties for people with Parkinson's disease.   Unless otherwise stated, CGA was provided and gait belt donned in order to ensure pt safety with NMR activities     PATIENT EDUCATION: Education details: Pt educated throughout session about proper posture and technique with exercises. Improved exercise technique, movement at target joints, use of target muscles after min to mod verbal, visual, tactile cues  Person educated:  Patient and Spouse Education method: Explanation, Demonstration, and Verbal cues Education comprehension: verbalized understanding  HOME EXERCISE PROGRAM: Seated PWR! Moves x 10 ea, handout provided   GOALS: Goals reviewed with patient? Yes  SHORT TERM GOALS: Target date: 09/14/2023  Pt will be independent with HEP in order to demonstrate increased ability to perform tasks related to occupation/hobbies. Baseline:  Pt to be given HEP at next visit. 7/31: has PWR! Activities, has not completed since fall and shoulder injury   Goal status: MET   LONG TERM GOALS: Target date: 03/03/2024    1.  Patient (> 94 years old) will complete five times sit to stand test in < 15 seconds indicating an increased LE strength and improved balance. Baseline: 15.03 sec 4/14: 11.79 sec  5/29: 11.06 sec Goal status: MET  2.  Patient will increase their ABC scale score to be 80.6% (an increase of 10 points), demonstrating improved balance confidence and reduced  fall risk, as measured through weekly assessments. Baseline: 70.6% 4/14:72 % 5/29: 74% 01/07/24: 90% Goal status: MET   3.  Patient will increase Berg Balance score by > 6 points to demonstrate decreased fall risk during functional activities. Baseline: 43 on 3/13 4/14: 49 5/29: 56 Goal status: MET   4.  Patient will reduce timed up and go to <11 seconds to reduce fall risk and demonstrate improved transfer/gait ability. Baseline: 13.38 sec 4/14: 11.4 sec no AD 5/29: 10.78 sec Goal status: MET  5.  Patient will increase 10 meter walk test to >1.17m/s as to improve gait speed for better community ambulation and to reduce fall risk. Baseline: 12.15 sec; 0.82 m/s 5/29: 1.06 m/s Goal status: MET  6.  Patient will increase six minute walk test distance to >1000 for progression to community ambulator and improve gait ability Baseline: 459 ft with RW stopping at 5:30 due to fatigue  09/21/23:1128 ft with 4WW  Goal status: MET  7.  Patient will increased Minibest by at least 3 points  to reduce fall risk and demonstrate improved transfer/gait ability. Baseline: 11/09/23: 22   7/31: 25 Goal status: MET  8.  Patient will be independent with gym-based exercise program that he can complete 2-3 times weekly to build his lower extremity strength and postural strength Baseline: no gym program, wants to continue to progress leg strength  9/11: has been introduced to gym activities, still needs set up assistance 9/25: comfortable with gym exercise routine at his fitness center  Goal status: MET   ASSESSMENT:  CLINICAL IMPRESSION:  Patient presents with good motivation for completion of physical therapy activities.  Pt now comfortable with completion of activities in [facility] gym and is ready for independent ad safe use in his community gym. Pt reviewed PWR! Moves and is comfortable with completion. Pt has met all of his PT goals at this time and is ready for discharge.  Patient will continue to benefit  from further instruction as well as gait and Parkinson specific training as indicated.  OBJECTIVE IMPAIRMENTS: Abnormal gait, decreased activity tolerance, decreased balance, decreased endurance, decreased knowledge of use of DME, decreased mobility, difficulty walking, decreased ROM, decreased strength, and decreased safety awareness.   ACTIVITY LIMITATIONS: carrying, lifting, bending, standing, squatting, bathing, and locomotion level  PARTICIPATION LIMITATIONS: cleaning, laundry, driving, shopping, community activity, and yard work  PERSONAL FACTORS: Age, Education, Past/current experiences, Time since onset of injury/illness/exacerbation, and 3+ comorbidities: orthostatic HTN, syncope, falls, anemia, insomnia, dyspnea are also affecting patient's functional outcome.   REHAB  POTENTIAL: Fair pt has been seen in the past.  CLINICAL DECISION MAKING: Evolving/moderate complexity  EVALUATION COMPLEXITY: Moderate  PLAN:  PT FREQUENCY: 2x/week  PT DURATION: 8 weeks  PLANNED INTERVENTIONS: 97110-Therapeutic exercises, 97530- Therapeutic activity, [id number]- Neuromuscular re-education, 97535- Self Care, [id number]- Manual therapy, [phone]- Gait training, Balance training, Stair training, and Vestibular training  PLAN FOR NEXT SESSION: D/C today.   Lonni KATHEE Gainer PT ,DPT Physical Therapist- [facility]   12:02 PM 03/03/24

## 2024-03-04 NOTE — Telephone Encounter (Signed)
 Copied from CRM [id number]. Topic: Clinical - Medication Question >> Mar 04, 2024  3:41 PM Thersia BROCKS wrote: Reason for CRM: Patient called in scheduled his appointment for 10/07 , patient wanted to know if he can get medication refilled while waiting for appointment

## 2024-03-07 ENCOUNTER — Ambulatory Visit: Admitting: Physical Therapy

## 2024-03-07 ENCOUNTER — Encounter: Admitting: Occupational Therapy

## 2024-03-10 ENCOUNTER — Encounter: Admitting: Occupational Therapy

## 2024-03-10 ENCOUNTER — Ambulatory Visit

## 2024-03-14 ENCOUNTER — Encounter: Admitting: Occupational Therapy

## 2024-03-14 ENCOUNTER — Ambulatory Visit: Admitting: Physical Therapy

## 2024-03-15 ENCOUNTER — Ambulatory Visit: Admitting: Internal Medicine

## 2024-03-15 VITALS — BP 128/68 | HR 61 | Ht 66.0 in | Wt 161.4 lb

## 2024-03-15 DIAGNOSIS — R29898 Other symptoms and signs involving the musculoskeletal system: Secondary | ICD-10-CM

## 2024-03-15 DIAGNOSIS — N1831 Chronic kidney disease, stage 3a: Secondary | ICD-10-CM | POA: Diagnosis not present

## 2024-03-15 DIAGNOSIS — G20A1 Parkinson's disease without dyskinesia, without mention of fluctuations: Secondary | ICD-10-CM | POA: Diagnosis not present

## 2024-03-15 DIAGNOSIS — Z23 Encounter for immunization: Secondary | ICD-10-CM | POA: Diagnosis not present

## 2024-03-15 DIAGNOSIS — D649 Anemia, unspecified: Secondary | ICD-10-CM

## 2024-03-15 DIAGNOSIS — F0670 Mild neurocognitive disorder due to known physiological condition without behavioral disturbance: Secondary | ICD-10-CM | POA: Diagnosis not present

## 2024-03-15 DIAGNOSIS — E7800 Pure hypercholesterolemia, unspecified: Secondary | ICD-10-CM

## 2024-03-15 DIAGNOSIS — I10 Essential (primary) hypertension: Secondary | ICD-10-CM

## 2024-03-15 DIAGNOSIS — M79605 Pain in left leg: Secondary | ICD-10-CM | POA: Diagnosis not present

## 2024-03-15 DIAGNOSIS — M79604 Pain in right leg: Secondary | ICD-10-CM | POA: Diagnosis not present

## 2024-03-15 DIAGNOSIS — E1121 Type 2 diabetes mellitus with diabetic nephropathy: Secondary | ICD-10-CM

## 2024-03-15 DIAGNOSIS — R262 Difficulty in walking, not elsewhere classified: Secondary | ICD-10-CM

## 2024-03-15 DIAGNOSIS — M4802 Spinal stenosis, cervical region: Secondary | ICD-10-CM | POA: Insufficient documentation

## 2024-03-15 DIAGNOSIS — R5383 Other fatigue: Secondary | ICD-10-CM | POA: Diagnosis not present

## 2024-03-15 MED ORDER — TETANUS-DIPHTH-ACELL PERTUSSIS 5-2-15.5 LF-MCG/0.5 IM SUSP: 0.5000 mL | Freq: Once | INTRAMUSCULAR | 0 refills | Status: AC

## 2024-03-15 MED ORDER — OMEPRAZOLE 20 MG PO CPDR: 20.0000 mg | DELAYED_RELEASE_CAPSULE | Freq: Every morning | ORAL | 3 refills | Status: AC

## 2024-03-15 NOTE — Assessment & Plan Note (Signed)
 May be due in part to cervical spinal stenosis given findings on Cervical spine CT June 2025. Needs EMG/[place] studies.

## 2024-03-15 NOTE — Progress Notes (Signed)
 Assessment & Plan:  1. Solitary pulmonary nodule (Primary)  2. Sleep apnea in adult  3. Periodic limb movement disorder  4. Parkinson's disease (HCC)  5. Need for immunization against influenza - Flu Vaccine QUAD High Dose(Fluad)   Patient Instructions  1.  Recommend taking 400 mg of MAGNESIUM GLYCINATE every evening to see if this helps with your restless legs.  You can get this supplement at stores like [employer] or other health food stores.  2.  Continue using your CPAP as you are currently doing.  3.  We will see you after follow-up with CT scan of the chest in December.  Please note: late entry documentation due to logistical difficulties during COVID-19 pandemic. This note is filed for information purposes only, and is not intended to be used for billing, nor does it represent the full scope/nature of the visit in question. Please see any associated scanned media linked to date of encounter for additional pertinent information.  Subjective:    HPI: Johnathan Arnold. is a 78 y.o. male presenting to the pulmonology clinic on 03/09/2019 with report of: Follow-up (breathing is doing well. wearing cpap avg 4-8hr nightly- feels pressure and mask is okay. )     Outpatient Encounter Medications as of 03/09/2019  Medication Sig Note   Multiple Vitamin (MULTIVITAMIN) tablet Take 1 tablet by mouth daily.    polyethylene glycol (MIRALAX  / GLYCOLAX ) packet Take 17 g by mouth daily as needed. 07/09/2023: prn   [DISCONTINUED] aspirin  81 MG tablet Take 162 mg by mouth daily. Takes 2 tablets daily.    [DISCONTINUED] carbidopa -levodopa  (SINEMET  CR) 50-200 MG tablet Take 1 tablet by mouth at bedtime.    [DISCONTINUED] carbidopa -levodopa  (SINEMET  IR) 25-100 MG tablet TAKE ONE TABLET BY MOUTH THREE TIMES A DAY    [DISCONTINUED] carvedilol  (COREG ) 6.25 MG tablet TAKE ONE TABLET BY MOUTH TWICE A DAY    [DISCONTINUED] clonazePAM  (KLONOPIN ) 0.5 MG tablet 1.5 tablets at bedtime  (Patient taking differently: Take 0.75 mg by mouth at bedtime. )    [DISCONTINUED] diazepam  (VALIUM ) 5 MG tablet Take 1 tablet (5 mg total) by mouth every 8 (eight) hours as needed for anxiety (or vertigo).    [DISCONTINUED] furosemide  (LASIX ) 40 MG tablet Take 1 tablet (40 mg total) by mouth daily.    [DISCONTINUED] isosorbide  mononitrate (IMDUR ) 60 MG 24 hr tablet TAKE ONE TABLET BY MOUTH DAILY    [DISCONTINUED] losartan  (COZAAR ) 25 MG tablet TAKE ONE TABLET BY MOUTH DAILY (Patient taking differently: Take 25 mg by mouth daily. )    [DISCONTINUED] nitroGLYCERIN  (NITROSTAT ) 0.4 MG SL tablet Place 1 tablet (0.4 mg total) under the tongue every 5 (five) minutes as needed for chest pain.    [DISCONTINUED] Omega-3 Fatty Acids (FISH OIL) 1200 MG CAPS Take 1,200 mg by mouth daily.  (Patient not taking: Reported on 12/14/2020)    [DISCONTINUED] omeprazole  (PRILOSEC) 20 MG capsule TAKE 1 CAPSULE BY MOUTH EVERY MORNING AS DIRECTED (Patient taking differently: Take 20 mg by mouth every other day. In the morning.)    [DISCONTINUED] ranolazine  (RANEXA ) 1000 MG SR tablet TAKE 1 TABLET(1000 MG) BY MOUTH TWICE DAILY (Patient taking differently: Take 1,000 mg by mouth 2 (two) times daily. )    [DISCONTINUED] simvastatin  (ZOCOR ) 20 MG tablet Take 1 tablet (20 mg total) by mouth at bedtime.    [DISCONTINUED] tamsulosin  (FLOMAX ) 0.4 MG CAPS capsule Take 1 capsule (0.4 mg total) by mouth daily.    No facility-administered encounter medications on  file as of 03/09/2019.      Objective:   Vitals:   03/09/19 0824  BP: 126/80  Pulse: (!) 55  Temp: 97.9 F (36.6 C)  Height: 5' 6 (1.676 m)  Weight: 187 lb 3.2 oz (84.9 kg)  SpO2: 98%  TempSrc: Oral  BMI (Calculated): 30.23     Physical exam documentation is limited by delayed entry of information.

## 2024-03-15 NOTE — Patient Instructions (Signed)
 Thank you for the coffee and goodies!    I will enjoy them   You are due for your tetanus-diptheria-pertussis vaccine   (TDaP)   You can get it for free  only  at your pharmacy

## 2024-03-15 NOTE — Assessment & Plan Note (Signed)
 With corrected UACR of 53 (August 2024)  and declining GFR . He is taking an ARB .  No prior SGLT 2 inhibitor , will recommend a trial and refer to nephrology  Lab Results  Component Value Date   HGBA1C 5.9 09/17/2022   Lab Results  Component Value Date   LABMICR See below: 04/15/2018   LABMICR See below: 02/27/2015

## 2024-03-15 NOTE — Progress Notes (Unsigned)
 Subjective:  Patient ID: Johnathan Arnold., male    DOB: May 17, 1946  Age: 78 y.o. MRN: [id number]  CC: The primary encounter diagnosis was Essential hypertension. 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   HPI Johnathan Arnold. presents for  Chief Complaint  Patient presents with   Medical Management of Chronic Issues    1) completed PT for balance and leg weakness last month  2) had fall In June while at PT; mildly displaced right clavicle fracture.  He was walking with PT and legs suddenly gave way  3) chronic low back pain:  due to remote history of L3 vertebral fracture (fell off a ladder 40 yrs ago)    Outpatient Medications Prior to Visit  Medication Sig Dispense Refill   acetaminophen  (TYLENOL ) 500 MG tablet Take 500 mg by mouth as needed.     aspirin  EC 81 MG tablet Take 1 tablet (81 mg total) by mouth at bedtime. 30 tablet 11   carbidopa -levodopa  (SINEMET  CR) 50-200 MG tablet TAKE 1 TABLET BY MOUTH AT BEDTIME 90 tablet 0   carbidopa -levodopa  (SINEMET  IR) 25-100 MG tablet 2 AT 8:00AM , 2 AT 11:00AM, 2 AT 2:00PM AND 1 AT 5:00PM 630 tablet 1   isosorbide  mononitrate (IMDUR ) 60 MG 24 hr tablet Take 1 tablet (60 mg total) by mouth daily. 90 tablet 3   losartan  (COZAAR ) 50 MG tablet Take 1 tablet (50 mg total) by mouth in the morning and at bedtime. 180 tablet 3   Magnesium Salicylate 325 MG TABS Take 1 each by mouth daily.     melatonin 3 MG TABS tablet Take 1.5 mg by mouth at bedtime as needed.     Multiple Vitamin (MULTIVITAMIN) tablet Take 1 tablet by mouth daily.     nitroGLYCERIN  (NITROSTAT ) 0.4 MG SL tablet Place 1 tablet (0.4 mg total) under the tongue every 5 (five) minutes as needed for chest pain. 25 tablet 1   omeprazole  (PRILOSEC) 20 MG capsule Take 1 capsule (20 mg total) by mouth every morning. 90 capsule 3    polyethylene glycol (MIRALAX  / GLYCOLAX ) packet Take 17 g by mouth daily as needed.     rosuvastatin  (CRESTOR ) 10 MG tablet Take 1 tablet (10 mg total) by mouth daily. 90 tablet 2   tamsulosin  (FLOMAX ) 0.4 MG CAPS capsule Take 1 capsule (0.4 mg total) by mouth daily. 90 capsule 3   traZODone  (DESYREL ) 50 MG tablet TAKE A HALF TO 1 TABLET BY MOUTH AT BEDTIME AS NEEDED FOR SLEEP 30 tablet 1   amLODipine  (NORVASC ) 2.5 MG tablet Take 1 tablet (2.5 mg total) by mouth at bedtime. 90 tablet 3   furosemide  (LASIX ) 20 MG tablet Take 1 tablet (20 mg total) by mouth daily as needed (for swelling). 90 tablet 3   hydrocortisone  cream 1 % Apply topically 2 (two) times daily.     ondansetron  (ZOFRAN ) 4 MG tablet Take 1 tablet (4 mg total) by mouth every 6 (six) hours as needed for nausea or vomiting. 20 tablet 0   oxyCODONE  (ROXICODONE ) 5 MG immediate release tablet Take 1 tablet (5 mg total) by mouth every 8 (eight) hours as needed for up to 12 doses. 12 tablet 0   No facility-administered medications prior to visit.    Review of Systems;  Patient denies headache, fevers, malaise, unintentional weight loss, skin rash, eye pain, sinus congestion and  sinus pain, sore throat, dysphagia,  hemoptysis , cough, dyspnea, wheezing, chest pain, palpitations, orthopnea, edema, abdominal pain, nausea, melena, diarrhea, constipation, flank pain, dysuria, hematuria, urinary  Frequency, nocturia, numbness, tingling, seizures,  Focal weakness, Loss of consciousness,  Tremor, insomnia, depression, anxiety, and suicidal ideation.      Objective:  BP (!) 140/62   Pulse 61   Ht 5' 6 (1.676 m)   Wt 161 lb 6.4 oz (73.2 kg)   SpO2 97%   BMI 26.05 kg/m   BP Readings from Last 3 Encounters:  03/15/24 (!) 140/62  12/14/23 (!) 115/40  11/26/23 (!) 158/80    Wt Readings from Last 3 Encounters:  03/15/24 161 lb 6.4 oz (73.2 kg)  12/14/23 155 lb (70.3 kg)  11/26/23 152 lb 1.9 oz (69 kg)    Physical Exam  Lab Results   Component Value Date   HGBA1C 5.9 09/17/2022   HGBA1C 5.7 03/18/2022   HGBA1C 5.4 05/07/2021    Lab Results  Component Value Date   CREATININE 1.26 09/08/2023   CREATININE 1.23 08/07/2023   CREATININE 1.32 (H) 08/06/2023    Lab Results  Component Value Date   WBC 3.4 (L) 09/08/2023   HGB 10.8 (L) 09/08/2023   HCT 32.2 (L) 09/08/2023   PLT 149.0 (L) 09/08/2023   GLUCOSE 159 (H) 09/08/2023   CHOL 113 09/17/2022   TRIG 86.0 09/17/2022   HDL 60.10 09/17/2022   LDLDIRECT 32.0 09/17/2022   LDLCALC 36 09/17/2022   ALT 17 08/06/2023   AST 19 08/06/2023   NA 143 09/08/2023   K 4.2 09/08/2023   CL 106 09/08/2023   CREATININE 1.26 09/08/2023   BUN 11 09/08/2023   CO2 29 09/08/2023   TSH 2.04 09/17/2022   PSA 1.10 09/06/2020   HGBA1C 5.9 09/17/2022    CT CHEST WO CONTRAST Result Date: 11/26/2023 CLINICAL DATA:  Blunt trauma. EXAM: CT CHEST WITHOUT CONTRAST TECHNIQUE: Multidetector CT imaging of the chest was performed following the standard protocol without IV contrast. RADIATION DOSE REDUCTION: This exam was performed according to the departmental dose-optimization program which includes automated exposure control, adjustment of the mA and/or kV according to patient size and/or use of iterative reconstruction technique. COMPARISON:  CT chest dated 08/04/2023. FINDINGS: Cardiovascular: Stable cardiomegaly. No pericardial effusion. Prior CABG. Thoracic aorta is normal in caliber with atherosclerotic calcification. Mediastinum/Nodes: No enlarged mediastinal or axillary lymph nodes. Thyroid  gland, trachea, and esophagus demonstrate no significant findings. Lungs/Pleura: Persistent trace bilateral pleural effusions, slightly decreased on the right compared to the prior exam. Stable 5 mm right apical nodule (4: 20) and 8 mm left basilar subpleural nodule (4:95), essentially unchanged since 2022, most compatible with a benign etiology, for which no specific follow-up imaging is recommended. No  focal consolidation or pneumothorax. Upper Abdomen: No acute abnormality. Musculoskeletal: Minimally displaced fracture of the distal right clavicle without evidence of extension to the acromioclavicular joint (2: 1-6). The remainder of the visualized bones appear intact. Unchanged sternotomy wires. Multilevel degenerative changes of the thoracic spine. IMPRESSION: 1. Minimally displaced fracture of the right distal clavicle without evidence of extension to the acromioclavicular joint. 2. No acute traumatic findings of the thoracic viscera. 3. Persistent trace bilateral pleural effusions, slightly decreased on the right since the prior exam. Aortic Atherosclerosis (ICD10-I70.0). Electronically Signed   By: Harrietta Sherry M.D.   On: 11/26/2023 14:09   DG Shoulder Right Result Date: 11/26/2023 CLINICAL DATA:  Pain after fall EXAM: RIGHT SHOULDER - 2+ VIEW COMPARISON:  None  Available. FINDINGS: Internal rotation, external rotation, transscapular views of the right shoulder are obtained. The evaluation is limited due to suboptimal technique. There is a minimally displaced distal right clavicular fracture which does not appear to involve the acromioclavicular joint. No other acute displaced fractures. 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 Visualized soft tissues are unremarkable. The right chest is clear. IMPRESSION: 1. Minimally displaced distal right clavicular fracture which does not extend into the acromioclavicular joint. 2. Mild right shoulder osteoarthritis. 3. Limited evaluation due to suboptimal technique. Electronically Signed   By: Ozell Daring M.D.   On: 11/26/2023 13:42   CT Cervical Spine Wo Contrast Result Date: 11/26/2023 CLINICAL DATA:  Neck trauma (Age >= 65y) EXAM: CT CERVICAL SPINE WITHOUT CONTRAST TECHNIQUE: Multidetector CT imaging of the cervical spine was performed without intravenous contrast. Multiplanar CT image reconstructions were also generated.  RADIATION DOSE REDUCTION: This exam was performed according to the departmental dose-optimization program which includes automated exposure control, adjustment of the mA and/or kV according to patient size and/or use of iterative reconstruction technique. COMPARISON:  CT of the cervical spine dated November 12, 2022. FINDINGS: Alignment: Normal. Skull base and vertebrae: Intact.  No lesions present. Soft tissues and spinal canal: Scattered shotty cervical lymphadenopathy. No evidence of paraspinous hematoma or soft tissue injury. Disc levels: Prominent bulging disc osteophyte complex at C3-4 causing moderate central spinal canal stenosis and severe bilateral neural foraminal stenosis. Prominent bulging disc osteophyte complex also present at C4-5, with moderate central spinal canal stenosis severe right neural foraminal stenosis and moderate left neural foraminal stenosis. Bulging disc osteophyte complex also at C5 6, with moderate central spinal canal stenosis and moderate to severe bilateral neural foraminal stenosis. At C6-7 there is mild central spinal canal stenosis and severe left neural foraminal stenosis. Upper chest: The visualized lung apices are clear. Other: None. IMPRESSION: 1. No evidence of acute traumatic injury. 2. 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 Electronically Signed   By: Evalene Coho M.D.   On: 11/26/2023 12:57   CT Head Wo Contrast Result Date: 11/26/2023 CLINICAL DATA:  fall EXAM: CT HEAD WITHOUT CONTRAST TECHNIQUE: Contiguous axial images were obtained from the base of the skull through the vertex without intravenous contrast. RADIATION DOSE REDUCTION: This exam was performed according to the departmental dose-optimization program which includes automated exposure control, adjustment of the mA and/or kV according to patient size and/or use of iterative reconstruction technique. COMPARISON:  CT  of the head dated August 05, 2023 and MRI of the brain dated March 30, 2023. FINDINGS: Brain: Moderate generalized cerebral volume loss. Moderate periventricular and deep cerebral white matter disease. No evidence of hemorrhage, mass, acute cortical infarct or hydrocephalus. No evidence acute intracranial injury. Vascular: Atherosclerotic disease within the carotid siphons. Skull: Intact and unremarkable. Sinuses/Orbits: Status post bilateral lens replacement. Clear paranasal sinuses and mastoid air cells. Other: Negative. IMPRESSION: 1. Age-related atrophy and moderate cerebral white matter disease. No evidence of acute traumatic injury. Electronically Signed   By: Evalene Coho M.D.   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   Follow-up: No follow-ups on file.   Verneita LITTIE Kettering, MD

## 2024-03-16 ENCOUNTER — Encounter: Payer: Self-pay | Admitting: Internal Medicine

## 2024-03-16 ENCOUNTER — Ambulatory Visit: Payer: Self-pay | Admitting: Internal Medicine

## 2024-03-16 LAB — CBC WITH DIFFERENTIAL/PLATELET
Basophils Absolute: 0.1 K/uL (ref 0.0–0.1)
Basophils Relative: 1.2 % (ref 0.0–3.0)
Eosinophils Absolute: 0.1 K/uL (ref 0.0–0.7)
Eosinophils Relative: 1.5 % (ref 0.0–5.0)
HCT: 32.7 % — ABNORMAL LOW (ref 39.0–52.0)
Hemoglobin: 11.1 g/dL — ABNORMAL LOW (ref 13.0–17.0)
Lymphocytes Relative: 47 % — ABNORMAL HIGH (ref 12.0–46.0)
Lymphs Abs: 2.1 K/uL (ref 0.7–4.0)
MCHC: 33.9 g/dL (ref 30.0–36.0)
MCV: 97.4 fl (ref 78.0–100.0)
Monocytes Absolute: 0.2 K/uL (ref 0.1–1.0)
Monocytes Relative: 5.5 % (ref 3.0–12.0)
Neutro Abs: 2 K/uL (ref 1.4–7.7)
Neutrophils Relative %: 44.8 % (ref 43.0–77.0)
Platelets: 128 K/uL — ABNORMAL LOW (ref 150.0–400.0)
RBC: 3.36 Mil/uL — ABNORMAL LOW (ref 4.22–5.81)
RDW: 13.1 % (ref 11.5–15.5)
WBC: 4.5 K/uL (ref 4.0–10.5)

## 2024-03-16 LAB — COMPREHENSIVE METABOLIC PANEL WITH GFR
ALT: 12 U/L (ref 0–53)
AST: 20 U/L (ref 0–37)
Albumin: 4.6 g/dL (ref 3.5–5.2)
Alkaline Phosphatase: 40 U/L (ref 39–117)
BUN: 17 mg/dL (ref 6–23)
CO2: 29 meq/L (ref 19–32)
Calcium: 8.9 mg/dL (ref 8.4–10.5)
Chloride: 104 meq/L (ref 96–112)
Creatinine, Ser: 1.61 mg/dL — ABNORMAL HIGH (ref 0.40–1.50)
GFR: 40.52 mL/min — ABNORMAL LOW (ref >60.00)
Glucose, Bld: 100 mg/dL — ABNORMAL HIGH (ref 70–99)
Potassium: 4.6 meq/L (ref 3.5–5.1)
Sodium: 140 meq/L (ref 135–145)
Total Bilirubin: 0.5 mg/dL (ref 0.2–1.2)
Total Protein: 6.1 g/dL (ref 6.0–8.3)

## 2024-03-16 LAB — MICROALBUMIN / CREATININE URINE RATIO
Creatinine,U: 171.9 mg/dL
Microalb Creat Ratio: 245.9 mg/g — ABNORMAL HIGH (ref 0.0–30.0)
Microalb, Ur: 42.3 mg/dL — ABNORMAL HIGH (ref 0.0–1.9)

## 2024-03-16 LAB — TSH: TSH: 1.7 u[IU]/mL (ref 0.35–5.50)

## 2024-03-16 LAB — LIPID PANEL
Cholesterol: 108 mg/dL (ref 0–200)
HDL: 56.9 mg/dL (ref >39.00)
LDL Cholesterol: 35 mg/dL (ref 0–99)
NonHDL: 51.35
Total CHOL/HDL Ratio: 2
Triglycerides: 83 mg/dL (ref 0.0–149.0)
VLDL: 16.6 mg/dL (ref 0.0–40.0)

## 2024-03-16 LAB — LDL CHOLESTEROL, DIRECT: Direct LDL: 30 mg/dL

## 2024-03-16 LAB — HEMOGLOBIN A1C: Hgb A1c MFr Bld: 5.9 % (ref 4.6–6.5)

## 2024-03-16 MED ORDER — EMPAGLIFLOZIN 10 MG PO TABS
10.0000 mg | ORAL_TABLET | Freq: Every day | ORAL | 2 refills | Status: DC
Start: 2024-03-16 — End: 2024-04-15

## 2024-03-16 NOTE — Assessment & Plan Note (Signed)
No change with change in statin.  Describes sensation as more discomfort and weakness.  Has started using leg weights at gym

## 2024-03-16 NOTE — Assessment & Plan Note (Signed)
 He remains functional, competent  and independent

## 2024-03-16 NOTE — Assessment & Plan Note (Signed)
 Continue Rock steady,  completed PT

## 2024-03-16 NOTE — Assessment & Plan Note (Addendum)
 Managed with losartan  .  GFR has dropped compared to April.  Lab Results  Component Value Date   CREATININE 1.61 (H) 03/15/2024   Lab Results  Component Value Date   NA 140 03/15/2024   K 4.6 03/15/2024   CL 104 03/15/2024   CO2 29 03/15/2024

## 2024-03-17 ENCOUNTER — Encounter

## 2024-03-17 ENCOUNTER — Ambulatory Visit: Admitting: Physical Therapy

## 2024-03-21 ENCOUNTER — Ambulatory Visit: Admitting: Physical Therapy

## 2024-03-21 ENCOUNTER — Encounter: Admitting: Occupational Therapy

## 2024-03-21 DIAGNOSIS — L820 Inflamed seborrheic keratosis: Secondary | ICD-10-CM | POA: Diagnosis not present

## 2024-03-21 DIAGNOSIS — Z85828 Personal history of other malignant neoplasm of skin: Secondary | ICD-10-CM | POA: Diagnosis not present

## 2024-03-21 DIAGNOSIS — D225 Melanocytic nevi of trunk: Secondary | ICD-10-CM | POA: Diagnosis not present

## 2024-03-21 DIAGNOSIS — D2262 Melanocytic nevi of left upper limb, including shoulder: Secondary | ICD-10-CM | POA: Diagnosis not present

## 2024-03-21 DIAGNOSIS — D2272 Melanocytic nevi of left lower limb, including hip: Secondary | ICD-10-CM | POA: Diagnosis not present

## 2024-03-21 DIAGNOSIS — L570 Actinic keratosis: Secondary | ICD-10-CM | POA: Diagnosis not present

## 2024-03-21 DIAGNOSIS — D2261 Melanocytic nevi of right upper limb, including shoulder: Secondary | ICD-10-CM | POA: Diagnosis not present

## 2024-03-21 DIAGNOSIS — L538 Other specified erythematous conditions: Secondary | ICD-10-CM | POA: Diagnosis not present

## 2024-03-24 ENCOUNTER — Ambulatory Visit: Admitting: Physical Therapy

## 2024-03-24 ENCOUNTER — Encounter

## 2024-03-28 ENCOUNTER — Ambulatory Visit: Admitting: Physical Therapy

## 2024-03-28 ENCOUNTER — Encounter

## 2024-03-31 ENCOUNTER — Ambulatory Visit: Admitting: Physical Therapy

## 2024-03-31 ENCOUNTER — Encounter: Admitting: Occupational Therapy

## 2024-04-04 ENCOUNTER — Ambulatory Visit: Admitting: Physical Therapy

## 2024-04-07 ENCOUNTER — Ambulatory Visit: Admitting: Physical Therapy

## 2024-04-11 ENCOUNTER — Ambulatory Visit: Admitting: Physical Therapy

## 2024-04-13 NOTE — Progress Notes (Unsigned)
 Cardiology Office Note    Date:  04/15/2024   ID:  Johnathan Arnold., DOB 08-24-1945, MRN [id number]  PCP:  Marylynn Verneita CROME, MD  Cardiologist:  Deatrice Cage, MD  Electrophysiologist:  None   Chief Complaint: Follow up  History of Present Illness:   Johnathan Arnold. is a 78 y.o. 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   R/LHC in 01/2018 showed occluded native arteries with patent grafts including LIMA to LAD, SVG to diagonal, SVG to OM, and SVG to distal RCA.  RHC showed normal filling pressures, mild pulmonary hypertension, and normal cardiac output.  Previous carotid Doppler showed mild nonobstructive bilateral disease.  He has a history of chronic bilateral exertional leg pain with normal lower extremity arterial Doppler in 2018.  In the setting of moderate to severe pulmonary hypertension, prior VQ scan was low probability for PE.  RHC in 03/2019 showed moderate pulmonary hypertension at 54/13 mmHg with a wedge pressure of 17 mmHg, and a PVR 2.22.  Pulmonary hypertension was felt to be of a mixed etiology.  He was admitted to the hospital in 05/2021 with a fall and generalized weakness, felt to be mostly polypharmacy-induced, including benzodiazepines and antihypertensive medications.  MRI of the brain was nonacute.  CT of the chest was negative for PE.  During the admission, he was noted to be bradycardic in the 40s bpm leading carvedilol  to be discontinued.   Zio patch in 02/2022 showed a predominant rhythm of sinus with an average rate of 58 bpm with a range of 45 to 176 bpm, 7 episodes of SVT lasting up to 13.8 seconds, and rare PACs and PVCs.  Carotid artery ultrasound in 03/2023 showed 1 to 39% bilateral ICA stenosis.   He was admitted in 06/2023 after presenting with  generalized weakness and was treated for UTI and underlying Parkinson's.  Losartan  was held.  He was admitted in 07/2023 after presenting with generalized weakness and found to have RSV, right lower lobe pneumonia, and diverticulitis.  He was most recently admitted to the hospital in 07/2023, again presenting with generalized weakness and fatigue with recommendation to hold PTA clonazepam .  At time of discharge, it was recommended he hold ranolazine  (was not taking medication prior to admission).   He was seen in the office in 09/2023 and was doing well from a cardiac perspective, without symptoms of angina or cardiac decompensation.  He was back on losartan  with discontinuation of hydralazine  and remained off ranolazine .  Blood pressure readings in the morning prior to taking medications were greater than 200 mmHg systolic at times with well-controlled blood pressure in the office.  It was recommended he take an additional 50 mg of losartan  in the evening with continuation of 50 mg losartan  in the morning along with Imdur  60 mg.  He was last seen in the office in 12/2023 and was doing well from a cardiac perspective.  He reported multiple mechanical falls including a fall in 11/2023 leading to a fractured right clavicle.  BP was elevated first thing in the morning prior to medications in the 170s to 190s mmHg systolic improving with losartan  and Imdur .  Blood pressure was well-controlled in the office.  It was recommended he continue to monitor blood pressure with addition of amlodipine  2.5 mg nightly  and continuation of losartan  50 mg twice daily and Imdur  60 mg daily.  He comes in doing well from a cardiac perspective and is without symptoms of angina or cardiac decompensation.  He did not start amlodipine  after last visit.  Functional status largely limited by chronic back pain and generalized malaise and fatigue that are longstanding and unchanged from baseline.  No dizziness, presyncope, or syncope.  Not  checking his blood pressure as often at home.  Remains on losartan  50 mg twice daily and isosorbide  to 60 mg daily.  Was recently started on Jardiance by PCP for kidney protection, though has not yet started this.  Lower extremity swelling or progressive orthopnea.  No falls or symptoms concerning for bleeding.   Labs independently reviewed: 03/2024 - TSH normal, Hgb 11.1, PLT 128, A1c 5.9, potassium 4.6, BUN 17, serum creatinine 1.61, albumin  4.6, AST/ALT normal, direct LDL 30, TC 108, TG 83, HDL 56, LDL 35  Past Medical History:  Diagnosis Date   3-vessel coronary artery disease    s/p  5 vessel CABG   Allergy June 2024   Diabetes mellitus without complication (HCC)    History of cardiac catheterization 2011   [facility]   Hyperlipidemia    Hypertension    Hypertriglyceridemia    Neuromuscular disorder [facility]) October 2017   Diagonis Parkinson   Parkinson's disease [facility])    Periodic limb movement disorder 12/09/2018   Pneumonia 12/28/2020   Right lower lobe pneumonia 12/28/2020   RSV (respiratory syncytial virus pneumonia) 07/26/2023   S/P CABG x 5 04/1998   Vertigo     Past Surgical History:  Procedure Laterality Date   CARDIAC CATHETERIZATION  05-19-2010   [facility]: Patent grafts. LIMA to LAD, SVG to D1, OM1 and RPDA   CORONARY ARTERY BYPASS GRAFT  03/1998   5 vessel, [facility]   RIGHT HEART CATH N/A 04/04/2019   Procedure: RIGHT HEART CATH;  Surgeon: Darron Deatrice LABOR, MD;  Location: [facility] INVASIVE CV LAB;  Service: Cardiovascular;  Laterality: N/A;   RIGHT/LEFT HEART CATH AND CORONARY ANGIOGRAPHY N/A 02/01/2018   Procedure: RIGHT/LEFT HEART CATH AND CORONARY ANGIOGRAPHY;  Surgeon: Darron Deatrice LABOR, MD;  Location: [facility] INVASIVE CV LAB;  Service: Cardiovascular;  Laterality: N/A;    Current Medications: Current Meds  Medication Sig   acetaminophen  (TYLENOL ) 500 MG tablet Take 500 mg by mouth as needed.   aspirin  EC 81 MG tablet Take 1 tablet (81 mg total) by mouth at bedtime.    carbidopa -levodopa  (SINEMET  CR) 50-200 MG tablet TAKE 1 TABLET BY MOUTH AT BEDTIME   carbidopa -levodopa  (SINEMET  IR) 25-100 MG tablet 2 AT 8:00AM , 2 AT 11:00AM, 2 AT 2:00PM AND 1 AT 5:00PM   isosorbide  mononitrate (IMDUR ) 60 MG 24 hr tablet Take 1 tablet (60 mg total) by mouth daily.   losartan  (COZAAR ) 50 MG tablet Take 1 tablet (50 mg total) by mouth in the morning and at bedtime.   Magnesium Salicylate 325 MG TABS Take 1 each by mouth daily.   melatonin 3 MG TABS tablet Take 1.5 mg by mouth at bedtime as needed.   Multiple Vitamin (MULTIVITAMIN) tablet Take 1 tablet by mouth daily.   nitroGLYCERIN  (NITROSTAT ) 0.4 MG SL tablet Place 1 tablet (0.4 mg total) under the tongue every 5 (five) minutes as needed for chest pain.   omeprazole  (PRILOSEC) 20 MG capsule Take 1 capsule (20 mg total) by mouth every morning.   polyethylene glycol (MIRALAX  / GLYCOLAX ) packet Take 17 g by mouth  daily as needed.   rosuvastatin  (CRESTOR ) 10 MG tablet Take 1 tablet (10 mg total) by mouth daily.   tamsulosin  (FLOMAX ) 0.4 MG CAPS capsule Take 1 capsule (0.4 mg total) by mouth daily.   traZODone  (DESYREL ) 50 MG tablet TAKE A HALF TO 1 TABLET BY MOUTH AT BEDTIME AS NEEDED FOR SLEEP    Allergies:   Paxlovid [nirmatrelvir-ritonavir]   Social History   Socioeconomic History   Marital status: Married    Spouse name: Not on file   Number of children: 2   Years of education: Not on file   Highest education level: Master's degree (e.g., MA, MS, MEng, MEd, MSW, MBA)  Occupational History   Occupation: retired    Comment: IT work  Tobacco Use   Smoking status: Never    Passive exposure: Yes   Smokeless tobacco: Never  Vaping Use   Vaping status: Never Used  Substance and Sexual Activity   Alcohol use: Yes    Comment: occasional beer   Drug use: No   Sexual activity: Not Currently  Other Topics Concern   Not on file  Social History Narrative   Right Handed    Lives in a two story home    Social  Drivers of Health   Financial Resource Strain: Low Risk  (03/15/2024)   Overall Financial Resource Strain (CARDIA)    Difficulty of Paying Living Expenses: Not hard at all  Food Insecurity: No Food Insecurity (03/15/2024)   Hunger Vital Sign    Worried About Running Out of Food in the Last Year: Never true    Ran Out of Food in the Last Year: Never true  Transportation Needs: No Transportation Needs (03/15/2024)   PRAPARE - Administrator, Civil Service (Medical): No    Lack of Transportation (Non-Medical): No  Physical Activity: Sufficiently Active (03/15/2024)   Exercise Vital Sign    Days of Exercise per Week: 3 days    Minutes of Exercise per Session: 60 min  Stress: No Stress Concern Present (03/15/2024)   [employer] of Occupational Health - Occupational Stress Questionnaire    Feeling of Stress: Only a little  Social Connections: Moderately Isolated (03/15/2024)   Social Connection and Isolation Panel    Frequency of Communication with Friends and Family: Once a week    Frequency of Social Gatherings with Friends and Family: Once a week    Attends Religious Services: More than 4 times per year    Active Member of [employer] or Organizations: No    Attends Engineer, Structural: Not on file    Marital Status: Married     Family History:  The patient's family history includes Healthy in his son; Heart attack (age of onset: 35) in his father; Heart attack (age of onset: 3) in his mother; Heart disease in his brother, brother, brother, brother, father, and mother; Hypertension in his mother.  ROS:   12-point review of systems is negative unless otherwise noted in the HPI.   EKGs/Labs/Other Studies Reviewed:    Studies reviewed were summarized above. The additional studies were reviewed today:  Carotid artery ultrasound 03/20/2023: Summary:  Right Carotid: Velocities in the right ICA are consistent with a 1-39%  stenosis.   Left Carotid: Velocities in  the left ICA are consistent with a 1-39%  stenosis.  The ECA appears >50% stenosed.   Vertebrals:  Bilateral vertebral arteries demonstrate antegrade flow.  Subclavians: Normal flow hemodynamics were seen in bilateral subclavian arteries.  __________  Zio patch 02/2022: Patient had a min HR of 45 bpm, max HR of 176 bpm, and avg HR of 58 bpm. Predominant underlying rhythm was Sinus Rhythm.  7 Supraventricular Tachycardia runs occurred, the run with the fastest interval lasting 4 beats with a max rate of 176 bpm, the  longest lasting 13.8 secs with an avg rate of 110 bpm.  Rare PACs and rare PVCs. __________   Carotid artery ultrasound 05/07/2021: IMPRESSION: 1. No hemodynamically significant stenosis or occlusive disease within either internal carotid artery. 2. Estimated 50% or greater stenosis at the origin/proximal ECA on the right. ___________   2D echo 01/31/2021:  1. Left ventricular ejection fraction, by estimation, is 60 to 65%. The  left ventricle has normal function. The left ventricle has no regional  wall motion abnormalities. Left ventricular diastolic parameters are  consistent with Grade II diastolic  dysfunction (pseudonormalization).   2. Right ventricular systolic function is low normal. The right  ventricular size is normal. There is moderately elevated pulmonary artery  systolic pressure. The estimated right ventricular systolic pressure is  57.0 mmHg.   3. Left atrial size was mildly dilated.   4. The mitral valve is normal in structure. Mild mitral valve  regurgitation.   5. The aortic valve is tricuspid. Aortic valve regurgitation is not  visualized.   6. The inferior vena cava is dilated in size with >50% respiratory  variability, suggesting right atrial pressure of 8 mmHg. __________   Aortic and iliac artery ultrasound 11/15/2020: Summary:  Abdominal Aorta:  No evidence of an abdominal aortic aneurysm was visualized.  The largest aortic measurement  is 1.9 cm.   Stenosis:  Brisk bi/triphasic flow in the common and external iliac arteries, without  significant plaque or stenosis identified. __________   ABIs 11/15/2020: ABI/TBIToday's ABIToday's TBIPrevious ABIPrevious TBI  +-------+-----------+-----------+------------+------------+  Right  1.15       1.00       1.25        1.16          +-------+-----------+-----------+------------+------------+  Left   1.13       0.87       1.29        1.08          +-------+-----------+-----------+------------+------------+   Bilateral ABIs appear essentially unchanged compared to prior study on  02/18/17.     Summary:  Right: Resting right ankle-brachial index is within normal range.  No evidence of significant right lower extremity arterial disease. The  right toe-brachial index is normal.   Left: Resting left ankle-brachial index is within normal range.  No evidence of significant left lower extremity arterial disease. The left  toe-brachial index is normal. __________   RHC 04/04/2019: Successful right heart catheterization via the right brachial vein. RA pressure: 10/15 with a mean of 13 mmHg. RV: 56/4 with an end-diastolic pressure of 16 mmHg PA: 54/13 with a mean of 29 mmHg.  Pulmonary vascular resistance is 2.22 Woods units Pulmonary capillary wedge pressure is 17 mmHg with prominent V waves. Cardiac output is 5.4 L/min with a cardiac index of 2.79.   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  He does have prominent V waves suggestive of significant mitral regurgitation.  I did review his recent echocardiogram done in September and his moderate regurgitation was moderate at most. His volume status appears to be reasonable and I  am hesitant to diurese him further given underlying chronic kidney disease. __________   2D echo  02/23/2019: 1. The left ventricle has normal systolic function with an ejection  fraction of 60-65%. The cavity size was normal. Left ventricular diastolic  Doppler parameters are consistent with pseudonormalization.   2. The right ventricle has normal systolic function. The cavity was  moderately enlarged. There is Right vetricular wall thickness was not  assessed. Right ventricular systolic pressure is severely elevated with an  estimated pressure of 61 mmHg.   3. Left atrial size was mild-moderately dilated.   4. Right atrial size was mildly dilated.   5. No evidence of mitral valve stenosis.   6. Pulmonic valve regurgitation is mild by color flow Doppler.   7. The aorta is normal unless otherwise noted.   8. The inferior vena cava was is dilated in size with <50% respiratory  variability, suggesting right atrial pressure of 15 mmHg. __________   [facility] 02/01/2018: Prox Cx lesion is 100% stenosed. Prox LAD lesion is 100% stenosed. Prox RCA to Mid RCA lesion is 90% stenosed. SVG and is normal in caliber. The graft exhibits no disease. SVG and is normal in caliber. The graft exhibits no disease. Ost 2nd Diag lesion is 100% stenosed. SVG and is normal in caliber. The graft exhibits no disease. Dist RCA lesion is 100% stenosed. LIMA graft was visualized by angiography and is normal in caliber. The graft exhibits no disease. Mid LAD lesion is 60% stenosed. Ost RPDA lesion is 60% stenosed.   1.  Occluded native coronary arteries with patent grafts including LIMA to LAD, SVG to diagonal, SVG to OM and SVG to distal RCA.  No significant graft disease.  Suspect proximal artery ischemia given occlusion of native vessels. 2.  Left ventricular angiography was not performed due to chronic kidney disease. 3.  Right heart catheterization showed normal filling pressures, mild pulmonary hypertension and normal cardiac output.  Mean PA pressure was 23 mmHg, pulmonary capillary wedge pressure was 9  mmHg and cardiac output was 7.7 L/min.   Recommendations: Continue medical therapy. __________   2D echo 03/19/2017: - Left ventricle: The cavity size was normal. There was mild    concentric hypertrophy. Systolic function was normal. The    estimated ejection fraction was in the range of 60% to 65%. Wall    motion was normal; there were no regional wall motion    abnormalities. Left ventricular diastolic function parameters    were normal.  - Mitral valve: There was mild regurgitation.  - Left atrium: The atrium was mildly dilated.  - Pulmonary arteries: Systolic pressure was mildly increased. PA    peak pressure: 45 mm Hg (S). __________   Lexiscan  MPI 03/17/2017: There is a small in size, mild in severity, reversible defect involving the mid anteroseptal segment consistent with mild ischemia. There is a small in size, severe, fixed defect at the apex that most likely represents artifact (attenuation and apical thinning) and less likely scar. The left ventricular ejection fraction is normal (60%). Patient was unable to reach target heart rate with exercise; the study was converted to Lexiscan . Upsloping ST segment depression ST segment depression of 1 mm was noted during stress in the I, II, V5 and V6 leads after administration of Lexiscan . __________   See Epic for remaining images.    EKG:  EKG is ordered today.  The EKG ordered today demonstrates NSR, 65 bpm, nonspecific lateral ST-T changes consistent with prior tracing  Recent  Labs: 09/08/2023: Magnesium 1.4 03/15/2024: ALT 12; BUN 17; Creatinine, Ser 1.61; Hemoglobin 11.1; Platelets 128.0; Potassium 4.6; Sodium 140; TSH 1.70  Recent Lipid Panel    Component Value Date/Time   CHOL 108 03/15/2024 1609   TRIG 83.0 03/15/2024 1609   HDL 56.90 03/15/2024 1609   CHOLHDL 2 03/15/2024 1609   VLDL 16.6 03/15/2024 1609   LDLCALC 35 03/15/2024 1609   LDLDIRECT 30.0 03/15/2024 1609    PHYSICAL EXAM:    VS:  BP 122/70    Pulse 65 Comment: 75 oximeter  Ht 5' 6 (1.676 m)   Wt 159 lb (72.1 kg)   SpO2 99%   BMI 25.66 kg/m   BMI: Body mass index is 25.66 kg/m.  Physical Exam Vitals reviewed.  Constitutional:      Appearance: He is well-developed.  HENT:     Head: Normocephalic and atraumatic.  Eyes:     General:        Right eye: No discharge.        Left eye: No discharge.  Cardiovascular:     Rate and Rhythm: Normal rate and regular rhythm.     Pulses:          Posterior tibial pulses are 2+ on the right side and 2+ on the left side.     Heart sounds: S1 normal and S2 normal. Heart sounds not distant. No midsystolic click and no opening snap. Murmur heard.     Systolic murmur is present with a grade of 1/6 at the upper right sternal border.     No friction rub.  Pulmonary:     Effort: Pulmonary effort is normal. No respiratory distress.     Breath sounds: Normal breath sounds. No decreased breath sounds, wheezing, rhonchi or rales.  Musculoskeletal:     Cervical back: Normal range of motion.     Right lower leg: No edema.     Left lower leg: No edema.  Skin:    General: Skin is warm and dry.     Nails: There is no clubbing.  Neurological:     Mental Status: He is alert and oriented to person, place, and time.  Psychiatric:        Speech: Speech normal.        Behavior: Behavior normal.        Thought Content: Thought content normal.        Judgment: Judgment normal.     Wt Readings from Last 3 Encounters:  04/15/24 159 lb (72.1 kg)  03/15/24 161 lb 6.4 oz (73.2 kg)  12/14/23 155 lb (70.3 kg)     ASSESSMENT & PLAN:   CAD status post CABG without angina: He continues to do well and is without symptoms concerning for angina or cardiac decompensation.  He remains on aspirin  81 mg and Imdur  60 mg.  No indication for further ischemic testing at this time.  Pulmonary hypertension: Asymptomatic.  Not requiring standing loop diuretic.  HTN: Blood pressure is well-controlled in the office  today on losartan  50 mg twice daily and isosorbide  mononitrate 60 mg daily.  Low-sodium diet recommended.  HLD: LDL 30 in 03/2024 with normal AST/ALT at that time.  He remains on rosuvastatin  10 mg.  Carotid artery disease: 1 to 39% bilateral ICA stenoses by carotid artery ultrasound in 03/2023 arteries and normal flow hemodynamics bilateral subclavian arteries.  Remains on aspirin  81 mg and rosuvastatin  10 mg.  Update carotid artery ultrasound.    Disposition: F/u with Dr. Darron  or an APP in 6 months.   Medication Adjustments/Labs and Tests Ordered: Current medicines are reviewed at length with the patient today.  Concerns regarding medicines are outlined above. Medication changes, Labs and Tests ordered today are summarized above and listed in the Patient Instructions accessible in Encounters.   Signed, Bernardino Bring, PA-C 04/15/2024 12:41 PM     [facility] [place] [address] [place], [place] [address] [phone]

## 2024-04-14 ENCOUNTER — Ambulatory Visit: Admitting: Physical Therapy

## 2024-04-15 ENCOUNTER — Encounter: Payer: Self-pay | Admitting: Physician Assistant

## 2024-04-15 ENCOUNTER — Ambulatory Visit: Attending: Physician Assistant | Admitting: Physician Assistant

## 2024-04-15 VITALS — BP 122/70 | HR 65 | Ht 66.0 in | Wt 159.0 lb

## 2024-04-15 DIAGNOSIS — I2510 Atherosclerotic heart disease of native coronary artery without angina pectoris: Secondary | ICD-10-CM | POA: Diagnosis not present

## 2024-04-15 DIAGNOSIS — I272 Pulmonary hypertension, unspecified: Secondary | ICD-10-CM | POA: Diagnosis not present

## 2024-04-15 DIAGNOSIS — E785 Hyperlipidemia, unspecified: Secondary | ICD-10-CM | POA: Diagnosis not present

## 2024-04-15 DIAGNOSIS — I6523 Occlusion and stenosis of bilateral carotid arteries: Secondary | ICD-10-CM | POA: Diagnosis not present

## 2024-04-15 DIAGNOSIS — I10 Essential (primary) hypertension: Secondary | ICD-10-CM | POA: Diagnosis not present

## 2024-04-15 NOTE — Patient Instructions (Addendum)
 Medication Instructions:   Your physician recommends that you continue on your current medications as directed. Please refer to the Current Medication list given to you today.    *If you need a refill on your cardiac medications before your next appointment, please call your pharmacy*  Lab Work:  None ordered at this time   If you have labs (blood work) drawn today and your tests are completely normal, you will receive your results only by:  MyChart Message (if you have MyChart) OR  A paper copy in the mail If you have any lab test that is abnormal or we need to change your treatment, we will call you to review the results.  Testing/Procedures:  Your physician has requested that you have a carotid duplex. This test is an ultrasound of the carotid arteries in your neck. It looks at blood flow through these arteries that supply the brain with blood.   Allow one hour for this exam.  There are no restrictions or special instructions.  This will take place at [address] [facility] [address], [place] [address]  Please note: We ask at that you not bring children with you during ultrasound (echo/ vascular) testing. Due to room size and safety concerns, children are not allowed in the ultrasound rooms during exams. Our front office staff cannot provide observation of children in our lobby area while testing is being conducted. An adult accompanying a patient to their appointment will only be allowed in the ultrasound room at the discretion of the ultrasound technician under special circumstances. We apologize for any inconvenience.   Referrals:  None ordered at this time   Follow-Up:  At [facility], you and your health needs are our priority.  As part of our continuing mission to provide you with exceptional heart care, our providers are all part of one team.  This team includes your primary Cardiologist (physician) and Advanced Practice Providers or APPs (Physician  Assistants and Nurse Practitioners) who all work together to provide you with the care you need, when you need it.  Your next appointment:   5 - 6 month(s)  Provider:    You may see Deatrice Cage, MD or one of the following Advanced Practice Providers on your designated Care Team:   Lonni Meager, NP Lesley Maffucci, PA-C Bernardino Bring, PA-C Cadence Prospect, PA-C Tylene Lunch, NP Barnie Hila, NP    We recommend signing up for the patient portal called MyChart.  Sign up information is provided on this After Visit Summary.  MyChart is used to connect with patients for Virtual Visits (Telemedicine).  Patients are able to view lab/test results, encounter notes, upcoming appointments, etc.  Non-urgent messages can be sent to your provider as well.   To learn more about what you can do with MyChart, go to [email or web address].

## 2024-04-18 ENCOUNTER — Ambulatory Visit: Admitting: Physical Therapy

## 2024-04-21 ENCOUNTER — Ambulatory Visit: Admitting: Physical Therapy

## 2024-04-25 ENCOUNTER — Ambulatory Visit: Admitting: Physical Therapy

## 2024-04-28 ENCOUNTER — Ambulatory Visit: Admitting: Physical Therapy

## 2024-05-02 ENCOUNTER — Ambulatory Visit: Admitting: Physical Therapy

## 2024-05-05 ENCOUNTER — Other Ambulatory Visit: Payer: Self-pay | Admitting: Internal Medicine

## 2024-05-09 ENCOUNTER — Ambulatory Visit: Admitting: Physical Therapy

## 2024-05-12 ENCOUNTER — Ambulatory Visit: Admitting: Physical Therapy

## 2024-05-16 ENCOUNTER — Ambulatory Visit: Admitting: Physical Therapy

## 2024-05-19 ENCOUNTER — Ambulatory Visit: Admitting: Physical Therapy

## 2024-05-22 ENCOUNTER — Other Ambulatory Visit: Payer: Self-pay | Admitting: Neurology

## 2024-05-22 DIAGNOSIS — G20A1 Parkinson's disease without dyskinesia, without mention of fluctuations: Secondary | ICD-10-CM

## 2024-05-23 ENCOUNTER — Ambulatory Visit: Admitting: Physical Therapy

## 2024-05-26 ENCOUNTER — Ambulatory Visit: Admitting: Physical Therapy

## 2024-05-30 ENCOUNTER — Ambulatory Visit: Admitting: Physical Therapy

## 2024-06-06 ENCOUNTER — Ambulatory Visit: Admitting: Physical Therapy

## 2024-06-10 ENCOUNTER — Ambulatory Visit: Admitting: Neurology

## 2024-06-13 ENCOUNTER — Ambulatory Visit: Admitting: Physical Therapy

## 2024-06-16 ENCOUNTER — Ambulatory Visit: Admitting: Physical Therapy

## 2024-06-20 ENCOUNTER — Ambulatory Visit: Admitting: Physical Therapy

## 2024-06-23 ENCOUNTER — Ambulatory Visit: Admitting: Physical Therapy

## 2024-06-23 NOTE — Progress Notes (Signed)
 "   Assessment/Plan:   1.  Parkinsons Disease, with worsening of symptoms when he has had systemic illness  -Patient's Parkinson's really looks adequately controlled today.  -Continue carbidopa /levodopa  25/100 to 2 at 8am, 2 at 11am, 2 at 3 pm, 1 at 5pm  -Continue carbidopa /levodopa  50/200 CR at bedtime.  - Patient is following with dermatology in regards to Parkinsons increased risk for melanoma  -He is in rock steady boxing 3 days/week.  2.  RLS/RBD  - His clonazepam  was stopped in the hospital when he was hospitalized for 1 episode of confusion.  He thinks the confusion was due to taking Tussionex/hydrocodone cough syrup after a prior hospitalization and I do not disagree.  In the future, it may be reasonable to restart clonazepam  at a very low dose.  I did not do that today, in part because primary care is now treating him with trazodone  and he seems to be doing okay with that, although he may need a higher dose (he does think it works, but has some insomnia)  -he has bed rails now and uses them  -melatonin 3 mg at bedtime.  3.  Sleep apnea and insomnia  -couldn't tolerate cpap and quit using it  - Primary care has the patient now on trazodone .  -pt on melatonin 3 mg q hs  4.  Pulmonary hypertension  -Follows with cardiology and pulmonary  5  MCI  -pt with neurocog testing in Jan, 2022 and demonstrated only MCI.  No evidence of a mood disorder.   6.  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  Patient is on amlodipine , losartan , Imdur .  I will  -Patient with syncope/near syncope in primary care office May 22, 2023.  Patient had been sick with vomiting and diarrheal illness and stood up to go to the lab and had the episode.  This was likely due to dehydration associated with orthostasis.  7.  Carotid bruit, left  -u/s on 03/20/23 was normal  8.   Nocturia  -Follows with urology  - This is a source of insomnia  - Latest urology visit felt that not wearing the CPAP at night was contributing and has just started back Subjective:   Johnathan Arnold. was seen today in follow up for Parkinsons disease.  My previous records were reviewed prior to todays visit as well as outside records available to me. pts wife with patient and supplements the hx. patient remains on his levodopa , both daytime and nighttime.  His only fall was a week after he saw me; he completed PT and was exhausted and legs gave way and he fell and fx the clavicle.  That was only fall.  No lightheadedness or near syncope.  He saw his cardiology PA on November 7.  Notes are reviewed.  He has completed physical therapy since last visit.  Those notes are reviewed.  He saw primary care October 7.  Current prescribed movement disorder medications: Carbidopa /levodopa  25/100, 2/2/2/1 Carbidopa /levodopa  50/200 CR at bedtime    Prior meds:  entacapone  (thought it caused weakness, but had urinary tract infection with hospitalization and was discharged and had another hospitalization right after it for pneumonia); clonazepam  (I discontinued on one of his hospitalizations for weakness)    ALLERGIES:   Allergies  Allergen Reactions   Paxlovid [Nirmatrelvir-Ritonavir]     CURRENT MEDICATIONS:  Outpatient Encounter Medications as of 06/24/2024  Medication Sig   acetaminophen  (TYLENOL )  500 MG tablet Take 500 mg by mouth as needed.   aspirin  EC 81 MG tablet Take 1 tablet (81 mg total) by mouth at bedtime.   carbidopa -levodopa  (SINEMET  CR) 50-200 MG tablet TAKE 1 TABLET BY MOUTH AT BEDTIME   carbidopa -levodopa  (SINEMET  IR) 25-100 MG tablet TAKE TAKE 2 TABLETS BY MOUTH DAILY AT 8 IN THE MORNING, TAKE 2 DAILY AT 11 IN THE MORNING, TAKE 2 DAILY AT 2 IN THE AFTERNOON AND TAKE 1 DAILY AT 5 IN THE EVENING   isosorbide  mononitrate (IMDUR ) 60 MG 24 hr tablet Take 1 tablet (60 mg total) by  mouth daily.   losartan  (COZAAR ) 50 MG tablet Take 1 tablet (50 mg total) by mouth in the morning and at bedtime.   Magnesium Salicylate 325 MG TABS Take 1 each by mouth daily.   melatonin 3 MG TABS tablet Take 1.5 mg by mouth at bedtime as needed.   Multiple Vitamin (MULTIVITAMIN) tablet Take 1 tablet by mouth daily.   nitroGLYCERIN  (NITROSTAT ) 0.4 MG SL tablet Place 1 tablet (0.4 mg total) under the tongue every 5 (five) minutes as needed for chest pain.   omeprazole  (PRILOSEC) 20 MG capsule Take 1 capsule (20 mg total) by mouth every morning.   polyethylene glycol (MIRALAX  / GLYCOLAX ) packet Take 17 g by mouth daily as needed.   rosuvastatin  (CRESTOR ) 10 MG tablet Take 1 tablet (10 mg total) by mouth daily.   tamsulosin  (FLOMAX ) 0.4 MG CAPS capsule Take 1 capsule (0.4 mg total) by mouth daily.   traZODone  (DESYREL ) 50 MG tablet TAKE A HALF TO 1 TABLET BY MOUTH EVERY NIGHT AT BEDTIME AS NEEDED FOR SLEEP   No facility-administered encounter medications on file as of 06/24/2024.    Objective:   PHYSICAL EXAMINATION:    VITALS:   Vitals:   06/24/24 1457  BP: 122/72  Pulse: (!) 57  SpO2: 97%  Weight: 166 lb 3.2 oz (75.4 kg)    Wt Readings from Last 3 Encounters:  06/24/24 166 lb 3.2 oz (75.4 kg)  04/15/24 159 lb (72.1 kg)  03/15/24 161 lb 6.4 oz (73.2 kg)    No data found.  GEN:  The patient appears stated age and is in NAD. HEENT:  Normocephalic, atraumatic.  The mucous membranes are moist. The superficial temporal arteries are without ropiness or tenderness.  CV:  regular Lungs: CTAB   Neurological examination:  Orientation: The patient is alert and oriented x3. Cranial nerves: There is good facial symmetry with minimal facial hypomimia. The speech is fluent and clear.  He is mildly hypophonic.  Soft palate rises symmetrically and there is no tongue deviation. Hearing is slightly decreased to conversational tone. Sensation: Sensation is intact to light touch  throughout Motor: Strength is 5/5 in the bilateral UE/LE   Movement examination: Tone: There is nl tone in the ue/le Abnormal movements: there is no tremor today Coordination:  There is no decremation with RAM's, with any form of RAMS, including alternating supination and pronation of the forearm, hand opening and closing, finger taps, heel taps and toe taps. Gait and Station: The patient pushes off to arise.  He ambulates well.  He is slightly forward flexed.  Stride length is fairly good.  Arm swing is good   Cc:  Johnathan Verneita CROME, MD "

## 2024-06-24 ENCOUNTER — Ambulatory Visit: Admitting: Neurology

## 2024-06-24 ENCOUNTER — Encounter: Payer: Self-pay | Admitting: Neurology

## 2024-06-24 VITALS — BP 122/72 | HR 57 | Wt 166.2 lb

## 2024-06-24 DIAGNOSIS — G20A1 Parkinson's disease without dyskinesia, without mention of fluctuations: Secondary | ICD-10-CM

## 2024-06-24 DIAGNOSIS — G4752 REM sleep behavior disorder: Secondary | ICD-10-CM | POA: Diagnosis not present

## 2024-06-24 NOTE — Patient Instructions (Signed)
" °  VISIT SUMMARY: During your visit, we discussed your recent clavicle fracture, increased tremor in your right leg and arm, and your ongoing management of Parkinson's disease and restless legs syndrome. We also reviewed your current medications and sleep regimen.  YOUR PLAN: -PARKINSON'S DISEASE: Parkinson's disease is a progressive nervous system disorder that affects movement. You have experienced a mild progression of tremors on your right side. We will continue your current carbidopa -levodopa  regimen and encourage you to stay active with [employer] and speech therapy. Please notify us  if you need refills for your medication. Your sleep regimen with trazodone  and melatonin appears to be working well, so continue with that.  INSTRUCTIONS: Please continue with your current medication regimen and exercise activities. Notify us  if you need any medication refills or if you experience any new symptoms. Follow up with us  as needed.    Contains text generated by [employer].   "

## 2024-06-27 ENCOUNTER — Ambulatory Visit: Admitting: Physical Therapy

## 2024-06-29 ENCOUNTER — Other Ambulatory Visit: Payer: Self-pay | Admitting: Internal Medicine

## 2024-07-20 ENCOUNTER — Ambulatory Visit: Payer: Medicare Other | Admitting: Urology

## 2024-09-13 ENCOUNTER — Ambulatory Visit: Admitting: Internal Medicine

## 2024-09-14 ENCOUNTER — Ambulatory Visit: Admitting: Physician Assistant

## 2024-11-28 ENCOUNTER — Ambulatory Visit

## 2024-12-29 ENCOUNTER — Ambulatory Visit: Payer: Self-pay | Admitting: Neurology
